# Patient Record
Sex: Female | Born: 1940
Health system: Southern US, Community
[De-identification: ages and names within clinical notes are randomized; demographics above are authoritative.]

## PROBLEM LIST (undated history)

## (undated) DIAGNOSIS — M1990 Unspecified osteoarthritis, unspecified site: Secondary | ICD-10-CM

## (undated) DIAGNOSIS — E7800 Pure hypercholesterolemia, unspecified: Secondary | ICD-10-CM

## (undated) DIAGNOSIS — I499 Cardiac arrhythmia, unspecified: Secondary | ICD-10-CM

## (undated) DIAGNOSIS — R351 Nocturia: Secondary | ICD-10-CM

## (undated) DIAGNOSIS — Z87440 Personal history of urinary (tract) infections: Secondary | ICD-10-CM

## (undated) DIAGNOSIS — N179 Acute kidney failure, unspecified: Secondary | ICD-10-CM

## (undated) DIAGNOSIS — G4730 Sleep apnea, unspecified: Secondary | ICD-10-CM

## (undated) DIAGNOSIS — H9319 Tinnitus, unspecified ear: Secondary | ICD-10-CM

## (undated) DIAGNOSIS — I209 Angina pectoris, unspecified: Secondary | ICD-10-CM

## (undated) DIAGNOSIS — R350 Frequency of micturition: Secondary | ICD-10-CM

## (undated) DIAGNOSIS — R202 Paresthesia of skin: Secondary | ICD-10-CM

## (undated) DIAGNOSIS — J45909 Unspecified asthma, uncomplicated: Secondary | ICD-10-CM

## (undated) DIAGNOSIS — R32 Unspecified urinary incontinence: Secondary | ICD-10-CM

## (undated) DIAGNOSIS — K219 Gastro-esophageal reflux disease without esophagitis: Secondary | ICD-10-CM

## (undated) DIAGNOSIS — R42 Dizziness and giddiness: Secondary | ICD-10-CM

## (undated) DIAGNOSIS — C801 Malignant (primary) neoplasm, unspecified: Secondary | ICD-10-CM

## (undated) DIAGNOSIS — I249 Acute ischemic heart disease, unspecified: Secondary | ICD-10-CM

## (undated) DIAGNOSIS — IMO0001 Reserved for inherently not codable concepts without codable children: Secondary | ICD-10-CM

## (undated) DIAGNOSIS — I10 Essential (primary) hypertension: Secondary | ICD-10-CM

## (undated) DIAGNOSIS — N189 Chronic kidney disease, unspecified: Secondary | ICD-10-CM

## (undated) HISTORY — PX: RENAL MASS EXCISION: SHX2324

## (undated) HISTORY — PX: APPENDECTOMY: SHX54

## (undated) HISTORY — DX: Unspecified asthma, uncomplicated: J45.909

## (undated) HISTORY — DX: Sleep apnea, unspecified: G47.30

## (undated) HISTORY — DX: Acute ischemic heart disease, unspecified: I24.9

## (undated) HISTORY — DX: Acute kidney failure, unspecified: N17.9

## (undated) HISTORY — PX: ABDOMINAL HYSTERECTOMY: SHX81

## (undated) HISTORY — PX: SPINE SURGERY: SHX786

---

## 1997-09-19 ENCOUNTER — Emergency Department (HOSPITAL_COMMUNITY): Admission: EM | Admit: 1997-09-19 | Discharge: 1997-09-19 | Payer: Self-pay | Admitting: Emergency Medicine

## 1998-06-06 ENCOUNTER — Ambulatory Visit (HOSPITAL_BASED_OUTPATIENT_CLINIC_OR_DEPARTMENT_OTHER): Admission: RE | Admit: 1998-06-06 | Discharge: 1998-06-06 | Payer: Self-pay | Admitting: Orthopaedic Surgery

## 1998-09-25 ENCOUNTER — Emergency Department (HOSPITAL_COMMUNITY): Admission: EM | Admit: 1998-09-25 | Discharge: 1998-09-25 | Payer: Self-pay | Admitting: Emergency Medicine

## 1998-09-25 ENCOUNTER — Encounter: Payer: Self-pay | Admitting: Emergency Medicine

## 1998-10-24 ENCOUNTER — Ambulatory Visit: Admission: RE | Admit: 1998-10-24 | Discharge: 1998-10-24 | Payer: Self-pay | Admitting: Family Medicine

## 1999-01-10 ENCOUNTER — Encounter: Admission: RE | Admit: 1999-01-10 | Discharge: 1999-01-10 | Payer: Self-pay | Admitting: Family Medicine

## 1999-01-10 ENCOUNTER — Encounter: Payer: Self-pay | Admitting: Family Medicine

## 1999-10-18 ENCOUNTER — Emergency Department (HOSPITAL_COMMUNITY): Admission: EM | Admit: 1999-10-18 | Discharge: 1999-10-19 | Payer: Self-pay | Admitting: Emergency Medicine

## 1999-11-22 ENCOUNTER — Encounter: Payer: Self-pay | Admitting: Family Medicine

## 1999-11-22 ENCOUNTER — Encounter: Admission: RE | Admit: 1999-11-22 | Discharge: 1999-11-22 | Payer: Self-pay | Admitting: Family Medicine

## 2000-04-21 ENCOUNTER — Emergency Department (HOSPITAL_COMMUNITY): Admission: EM | Admit: 2000-04-21 | Discharge: 2000-04-21 | Payer: Self-pay | Admitting: Emergency Medicine

## 2000-04-30 ENCOUNTER — Encounter: Admission: RE | Admit: 2000-04-30 | Discharge: 2000-04-30 | Payer: Self-pay | Admitting: Family Medicine

## 2000-04-30 ENCOUNTER — Encounter: Payer: Self-pay | Admitting: Family Medicine

## 2000-05-15 ENCOUNTER — Encounter: Payer: Self-pay | Admitting: Family Medicine

## 2000-05-15 ENCOUNTER — Encounter: Admission: RE | Admit: 2000-05-15 | Discharge: 2000-05-15 | Payer: Self-pay | Admitting: Family Medicine

## 2000-12-29 ENCOUNTER — Encounter: Payer: Self-pay | Admitting: Family Medicine

## 2000-12-29 ENCOUNTER — Encounter: Admission: RE | Admit: 2000-12-29 | Discharge: 2000-12-29 | Payer: Self-pay | Admitting: Family Medicine

## 2001-04-28 ENCOUNTER — Encounter: Payer: Self-pay | Admitting: Family Medicine

## 2001-04-28 ENCOUNTER — Encounter: Admission: RE | Admit: 2001-04-28 | Discharge: 2001-04-28 | Payer: Self-pay | Admitting: Family Medicine

## 2001-09-14 ENCOUNTER — Encounter: Admission: RE | Admit: 2001-09-14 | Discharge: 2001-09-14 | Payer: Self-pay | Admitting: Family Medicine

## 2001-09-14 ENCOUNTER — Encounter: Payer: Self-pay | Admitting: Family Medicine

## 2001-09-24 ENCOUNTER — Ambulatory Visit (HOSPITAL_COMMUNITY): Admission: RE | Admit: 2001-09-24 | Discharge: 2001-09-24 | Payer: Self-pay | Admitting: Gastroenterology

## 2001-12-01 ENCOUNTER — Encounter: Admission: RE | Admit: 2001-12-01 | Discharge: 2002-03-01 | Payer: Self-pay | Admitting: Family Medicine

## 2002-01-22 ENCOUNTER — Encounter: Admission: RE | Admit: 2002-01-22 | Discharge: 2002-01-22 | Payer: Self-pay | Admitting: Family Medicine

## 2002-01-22 ENCOUNTER — Encounter: Payer: Self-pay | Admitting: Family Medicine

## 2002-06-08 ENCOUNTER — Encounter: Payer: Self-pay | Admitting: Family Medicine

## 2002-06-08 ENCOUNTER — Encounter: Admission: RE | Admit: 2002-06-08 | Discharge: 2002-06-08 | Payer: Self-pay | Admitting: Family Medicine

## 2002-07-29 ENCOUNTER — Encounter: Payer: Self-pay | Admitting: Neurosurgery

## 2002-08-02 ENCOUNTER — Encounter: Payer: Self-pay | Admitting: Neurosurgery

## 2002-08-02 ENCOUNTER — Inpatient Hospital Stay (HOSPITAL_COMMUNITY): Admission: RE | Admit: 2002-08-02 | Discharge: 2002-08-04 | Payer: Self-pay | Admitting: Neurosurgery

## 2002-08-07 ENCOUNTER — Inpatient Hospital Stay (HOSPITAL_COMMUNITY): Admission: EM | Admit: 2002-08-07 | Discharge: 2002-08-10 | Payer: Self-pay

## 2002-08-07 ENCOUNTER — Encounter: Payer: Self-pay | Admitting: *Deleted

## 2002-08-08 ENCOUNTER — Encounter: Payer: Self-pay | Admitting: Family Medicine

## 2002-08-18 ENCOUNTER — Encounter: Payer: Self-pay | Admitting: Emergency Medicine

## 2002-08-18 ENCOUNTER — Emergency Department (HOSPITAL_COMMUNITY): Admission: EM | Admit: 2002-08-18 | Discharge: 2002-08-18 | Payer: Self-pay | Admitting: Emergency Medicine

## 2003-03-25 ENCOUNTER — Encounter: Admission: RE | Admit: 2003-03-25 | Discharge: 2003-03-25 | Payer: Self-pay | Admitting: Family Medicine

## 2003-05-09 ENCOUNTER — Emergency Department (HOSPITAL_COMMUNITY): Admission: EM | Admit: 2003-05-09 | Discharge: 2003-05-10 | Payer: Self-pay | Admitting: Emergency Medicine

## 2003-07-20 ENCOUNTER — Ambulatory Visit (HOSPITAL_COMMUNITY): Admission: RE | Admit: 2003-07-20 | Discharge: 2003-07-20 | Payer: Self-pay | Admitting: Neurosurgery

## 2003-08-04 ENCOUNTER — Inpatient Hospital Stay (HOSPITAL_COMMUNITY): Admission: RE | Admit: 2003-08-04 | Discharge: 2003-08-08 | Payer: Self-pay | Admitting: Neurosurgery

## 2003-08-12 ENCOUNTER — Emergency Department (HOSPITAL_COMMUNITY): Admission: EM | Admit: 2003-08-12 | Discharge: 2003-08-12 | Payer: Self-pay | Admitting: Emergency Medicine

## 2003-09-12 ENCOUNTER — Emergency Department (HOSPITAL_COMMUNITY): Admission: EM | Admit: 2003-09-12 | Discharge: 2003-09-13 | Payer: Self-pay | Admitting: Emergency Medicine

## 2003-10-19 ENCOUNTER — Emergency Department (HOSPITAL_COMMUNITY): Admission: EM | Admit: 2003-10-19 | Discharge: 2003-10-19 | Payer: Self-pay

## 2003-10-27 ENCOUNTER — Encounter: Admission: RE | Admit: 2003-10-27 | Discharge: 2003-10-27 | Payer: Self-pay | Admitting: Family Medicine

## 2003-12-02 ENCOUNTER — Ambulatory Visit (HOSPITAL_COMMUNITY): Admission: RE | Admit: 2003-12-02 | Discharge: 2003-12-02 | Payer: Self-pay | Admitting: Neurosurgery

## 2004-10-16 ENCOUNTER — Encounter: Admission: RE | Admit: 2004-10-16 | Discharge: 2004-10-16 | Payer: Self-pay | Admitting: Family Medicine

## 2005-01-30 ENCOUNTER — Emergency Department (HOSPITAL_COMMUNITY): Admission: EM | Admit: 2005-01-30 | Discharge: 2005-01-30 | Payer: Self-pay | Admitting: Emergency Medicine

## 2005-02-02 ENCOUNTER — Emergency Department (HOSPITAL_COMMUNITY): Admission: EM | Admit: 2005-02-02 | Discharge: 2005-02-02 | Payer: Self-pay | Admitting: Emergency Medicine

## 2005-06-18 IMAGING — CT CT CHEST W/ CM
1 of 2 series · 15 of 28 positions shown, 19 images · IV contrast (omnipaque)
Comparison: none

FINDINGS
CLINICAL DATA: SHORTNESS OF BREATH AND RECENT CERVICAL SPINE FUSION SURGERY.
CT CHEST WITH CONTRAST, CT LOWER EXTREMITY BILATERAL WITH CONTRAST 08/07/02
COMPARISON CHEST X-RAY PERFORMED ON THE SAME DAY.
CT CHEST WITH CONTRAST
PATIENT RECEIVED 150 CC OMNIPAQUE 300 IV CONTRAST.  A PE PROTOCOL CHEST CT WAS PERFORMED.  THE
PULMONARY ARTERIES ARE WELL OPACIFIED TO THE SUBSEGMENTAL LEVEL.  NO EVIDENCE OF FILLING DEFECT TO
SUGGEST PULMONARY EMBOLISM BY CT.  LUNG WINDOWS SHOW NO EVIDENCE OF INFILTRATE OR EDEMA.  SCATTERED
AREAS OF SCARRING AND SUBSEGMENTAL ATELECTASIS PRESENT IN BOTH LUNGS.  NO EVIDENCE OF ADENOPATHY.
NO PLEURAL OR PERICARDIAL FLUID.
IMPRESSION
NORMAL CT OF THE CHEST.  NO EVIDENCE OF PULMONARY EMBOLISM OR OTHER ACUTE PROCESS.
CT LOWER EXTREMITY BILATERAL WITH CONTRAST
DELAYED IMAGING THROUGH THE LOWER EXTREMITIES AND PELVIS WAS PERFORMED AFTER ADMINISTRATION OF IV
CONTRAST ON THE CHEST CT.  DEEP VENOUS STRUCTURES ARE WELL OPACIFIED AND ARE NORMALLY PATENT
BILATERALLY WITHOUT EVIDENCE OF THROMBUS.
NO EVIDENCE OF VENOUS THROMBOSIS.

[Series 2: *don't forget:recon (date) offon · axial · 0.70mm/px · z∈[-700,-59]mm · 15 of 171 slices shown, 19 images]
[im 10/171  mediastinal]
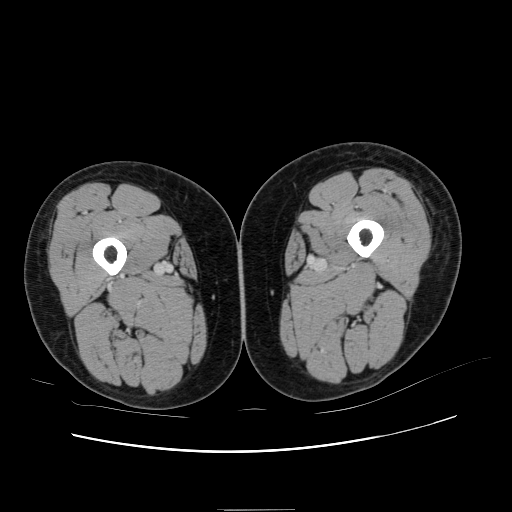
[im 10/171  lung]
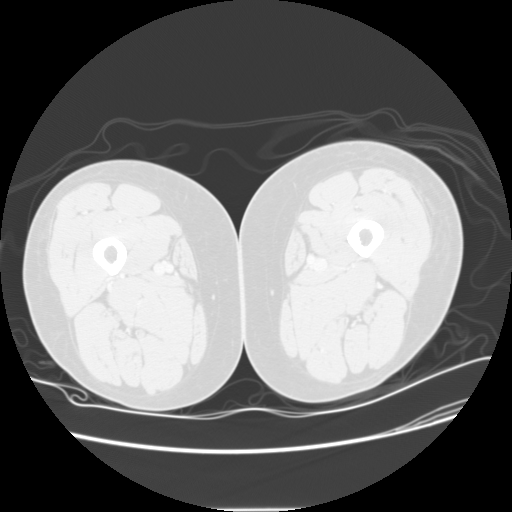
[im 19/171  lung]
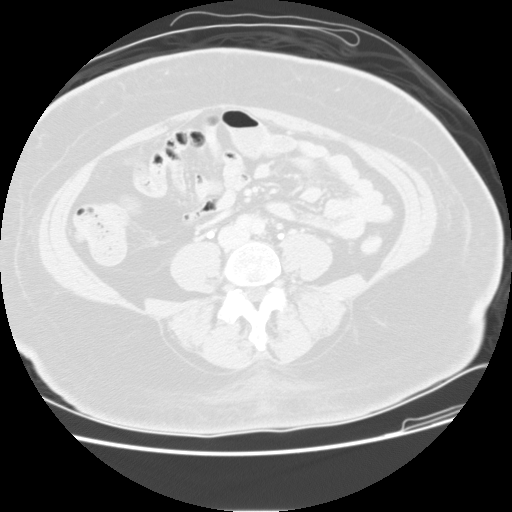
[im 29/171  lung]
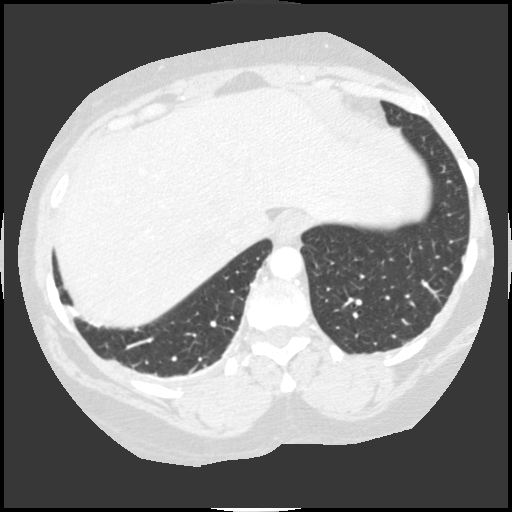
[im 38/171  lung]
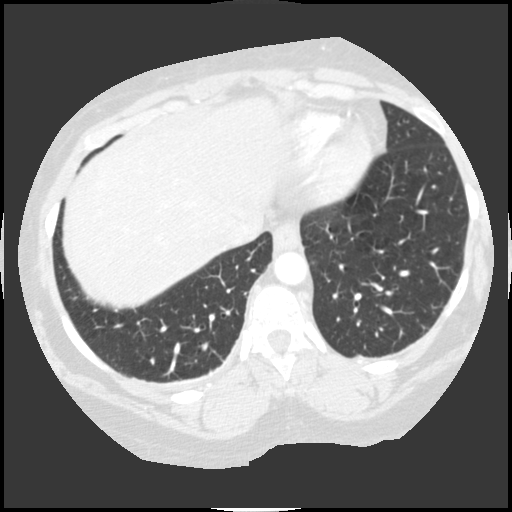
[im 57/171  mediastinal]
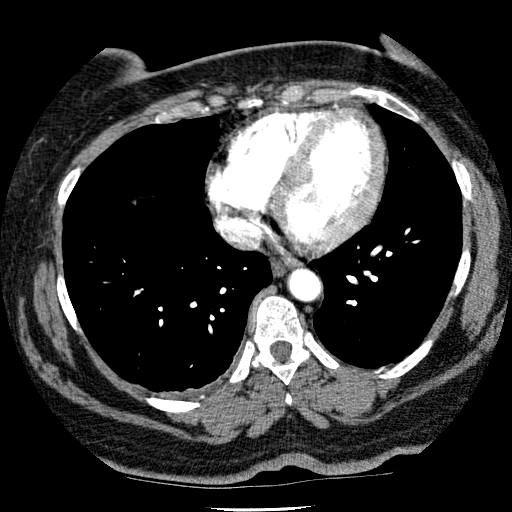
[im 57/171  lung]
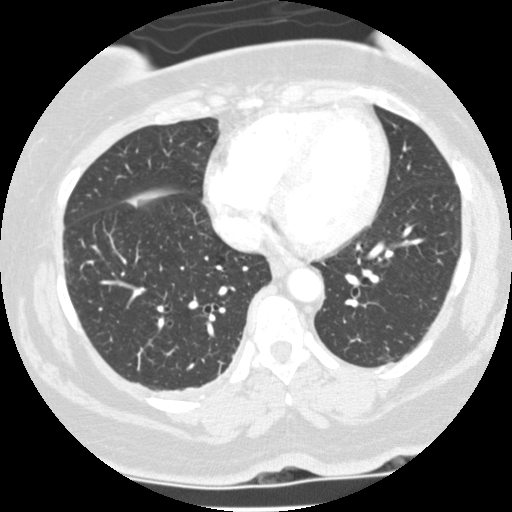
[im 67/171  lung]
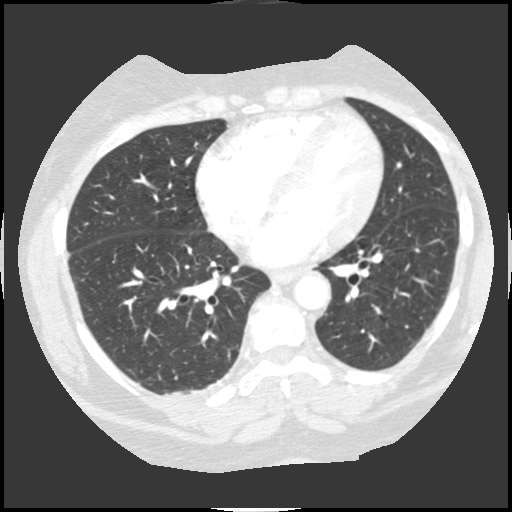
[im 76/171  lung]
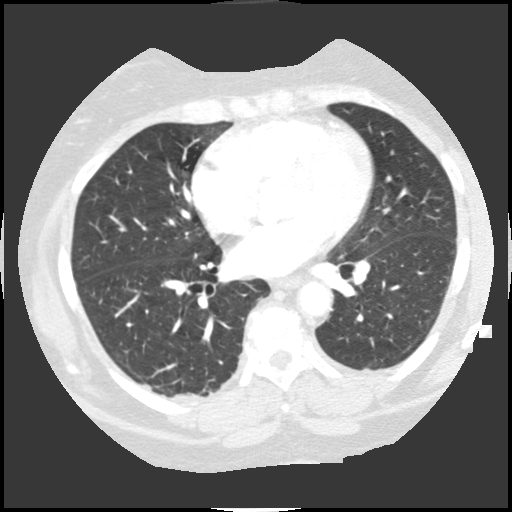
[im 86/171  lung]
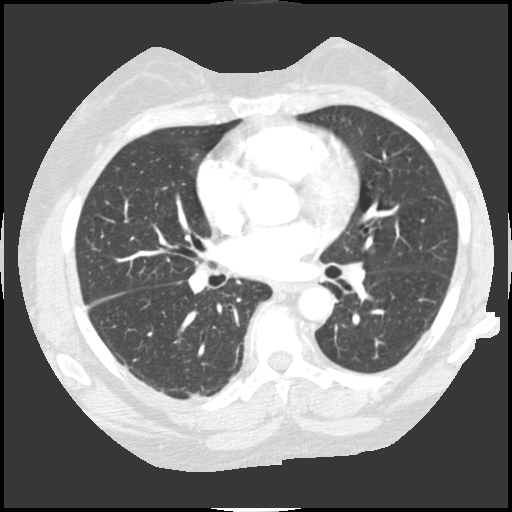
[im 95/171  mediastinal]
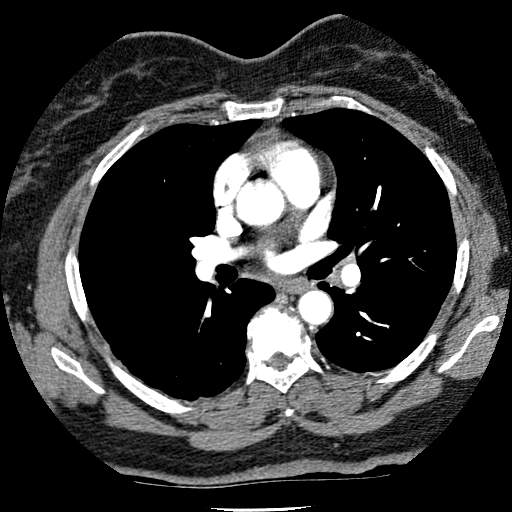
[im 95/171  lung]
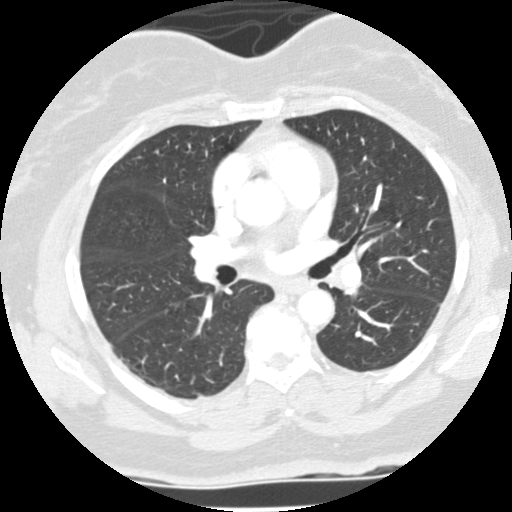
[im 104/171  lung]
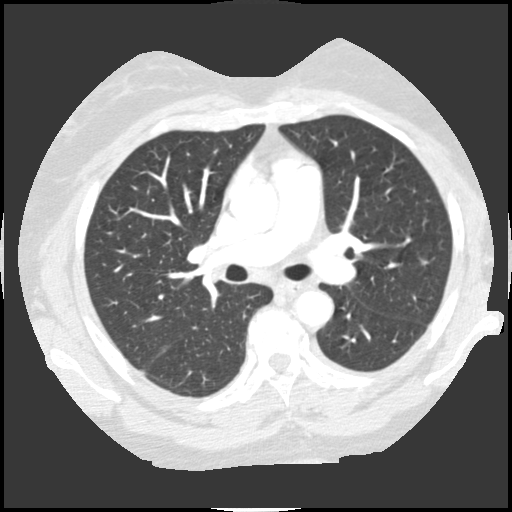
[im 114/171  lung]
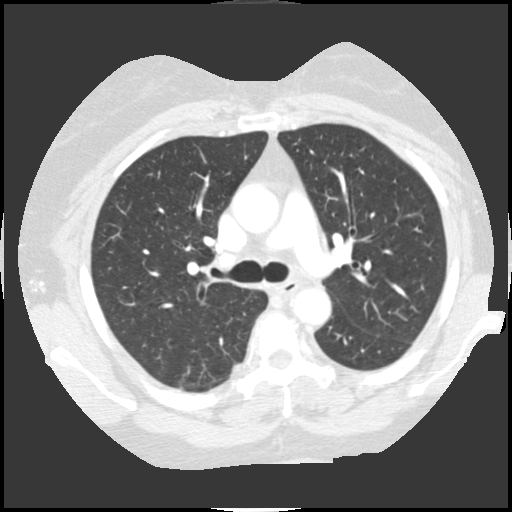
[im 133/171  lung]
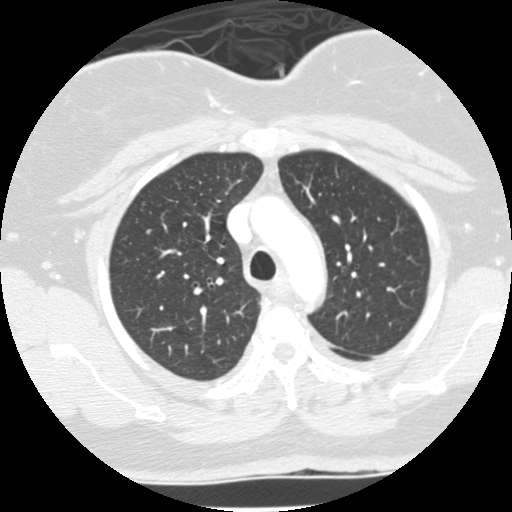
[im 142/171  mediastinal]
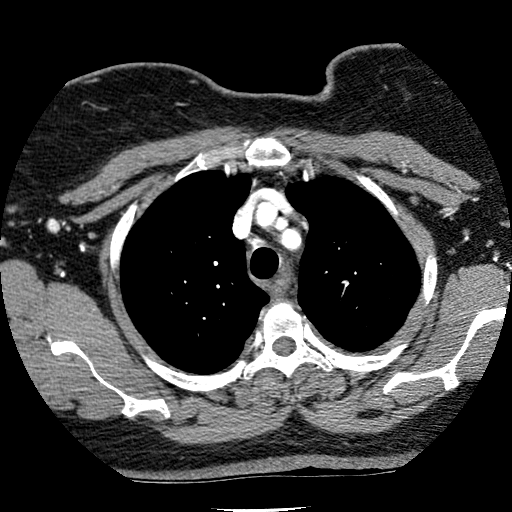
[im 142/171  lung]
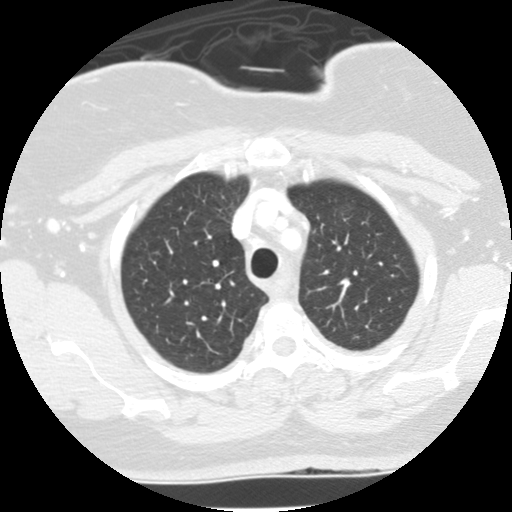
[im 152/171  lung]
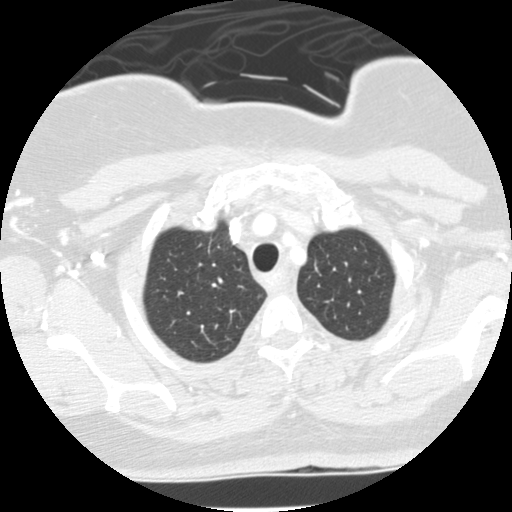
[im 161/171  lung]
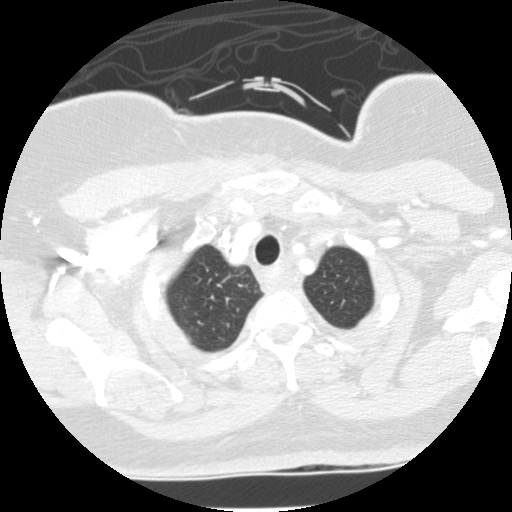

[15 of 28 positions shown; findings below may reference images not displayed]

## 2005-10-01 ENCOUNTER — Emergency Department (HOSPITAL_COMMUNITY): Admission: EM | Admit: 2005-10-01 | Discharge: 2005-10-02 | Payer: Self-pay | Admitting: Emergency Medicine

## 2005-10-02 ENCOUNTER — Emergency Department (HOSPITAL_COMMUNITY): Admission: EM | Admit: 2005-10-02 | Discharge: 2005-10-02 | Payer: Self-pay | Admitting: Anesthesiology

## 2005-10-03 ENCOUNTER — Emergency Department (HOSPITAL_COMMUNITY): Admission: EM | Admit: 2005-10-03 | Discharge: 2005-10-03 | Payer: Self-pay | Admitting: Emergency Medicine

## 2005-10-29 ENCOUNTER — Emergency Department (HOSPITAL_COMMUNITY): Admission: EM | Admit: 2005-10-29 | Discharge: 2005-10-29 | Payer: Self-pay | Admitting: Emergency Medicine

## 2005-11-18 ENCOUNTER — Encounter: Admission: RE | Admit: 2005-11-18 | Discharge: 2005-11-18 | Payer: Self-pay | Admitting: Emergency Medicine

## 2005-12-04 ENCOUNTER — Encounter: Admission: RE | Admit: 2005-12-04 | Discharge: 2005-12-04 | Payer: Self-pay | Admitting: Emergency Medicine

## 2005-12-09 ENCOUNTER — Encounter: Admission: RE | Admit: 2005-12-09 | Discharge: 2005-12-09 | Payer: Self-pay | Admitting: Emergency Medicine

## 2005-12-18 ENCOUNTER — Encounter: Admission: RE | Admit: 2005-12-18 | Discharge: 2005-12-18 | Payer: Self-pay | Admitting: Emergency Medicine

## 2006-01-15 ENCOUNTER — Encounter: Admission: RE | Admit: 2006-01-15 | Discharge: 2006-01-15 | Payer: Self-pay | Admitting: Emergency Medicine

## 2006-03-04 ENCOUNTER — Encounter: Admission: RE | Admit: 2006-03-04 | Discharge: 2006-03-04 | Payer: Self-pay | Admitting: Emergency Medicine

## 2006-03-10 ENCOUNTER — Inpatient Hospital Stay (HOSPITAL_COMMUNITY): Admission: EM | Admit: 2006-03-10 | Discharge: 2006-03-13 | Payer: Self-pay | Admitting: Emergency Medicine

## 2006-03-11 ENCOUNTER — Encounter (INDEPENDENT_AMBULATORY_CARE_PROVIDER_SITE_OTHER): Payer: Self-pay | Admitting: Cardiovascular Disease

## 2006-03-18 ENCOUNTER — Ambulatory Visit: Payer: Self-pay | Admitting: Gastroenterology

## 2006-04-15 ENCOUNTER — Ambulatory Visit: Payer: Self-pay | Admitting: Gastroenterology

## 2006-04-15 ENCOUNTER — Encounter (INDEPENDENT_AMBULATORY_CARE_PROVIDER_SITE_OTHER): Payer: Self-pay | Admitting: Specialist

## 2006-05-31 IMAGING — CT CT CERVICAL SPINE W/ CM
3 of 7 series · 15 of 33 positions shown, 17 images · IV contrast (agent unspecified)
Comparison: none

** THIS REPORT HAS BEEN UPDATED TO INCLUDE ALL ASSOCIATED EXAMS – 07/25/03**
CLINICAL DATA: Status-post C-5 to C-7 ACDF with continued radicular symptoms. 
 CERVICAL MYELOGRAM AND POST-MYELOGRAM CT 
 CERVICAL MYELOGRAM 
 The lumbar puncture and placement of intrathecal contrast was performed by Dr. Fallon Jim.  Cervical myelogram was then performed.
 The cervical myelogram images are limited due to dilution of the contrast in the cervical region and the cervical plate being in place.  On the lateral views, there does appear to be a small anterior extradural impression at the C3-4 level.  There also appears to be moderate anterior extradural defect at C6-7.  This does not change significantly with flexion or extension nor does alignment.  
 The obliques and AP view are again limited due to dilution of the intrathecal contrast.  Please see CT report below.  
 IMPRESSION
 Mild anterior extradural defect at C3-4.  Moderate anterior extradural defect at C6-7.  Please see post-myelogram CT report below. 
 POST-MYELOGRAM CERVICAL CT WITH CONTRAST
 Post-myelogram CT was performed from the skull base to T-2. 
 Craniocervical junction is unremarkable.  Alignment of the cervical spine is normal.  Patient is status-post ACDF C-5 to C-7 with normal alignment at this region and no evidence of hardware complicating feature. 
 C2-3:  No evidence of herniation, central spinal stenosis or neuroforaminal narrowing. 
 C3-4:  Slight diffuse anterior extradural impression noted, likely related to mild diffuse disc bulge.  No evidence of significant central spinal stenosis or neural foraminal narrowing. 
 C4-5:  There is a small extradural defect noted in the right paracentral region, likely representing a small disc protrusion in this region.  This does not appear to cause significant neural foraminal narrowing although it could cause impression on the right C-5 nerve root.  No central spinal stenosis.  
 C5-6:  Post-operative changes noted from ACDF.  No central spinal stenosis or neuroforaminal narrowing.  No extradural defects. 
 C6-7:  There are large posterior osteophytes causing significant impression on the anterior thecal sac.  The cord does not appear to be deformed in this region.  No evidence of neural foraminal narrowing. 
 C7-T1:  No evidence of central spinal stenosis or neuroforaminal narrowing. 
 1.  Mild broad based disc bulge at C3-4 causing slight impression on the anterior thecal sac. 
 2.  Small right paracentral disc protrusion at C4-5.
 3.  Prominent posterior osteophytes at C6-7 causing loss of the anterior thecal sac.  
 4.  Status-post ACDF C-5 to C-7.

[Series 2: cervical spine · axial · 0.27mm/px · z∈[+84,+194]mm · 5 of 66 slices shown, 7 images]
[im 11/66  soft-tissue]
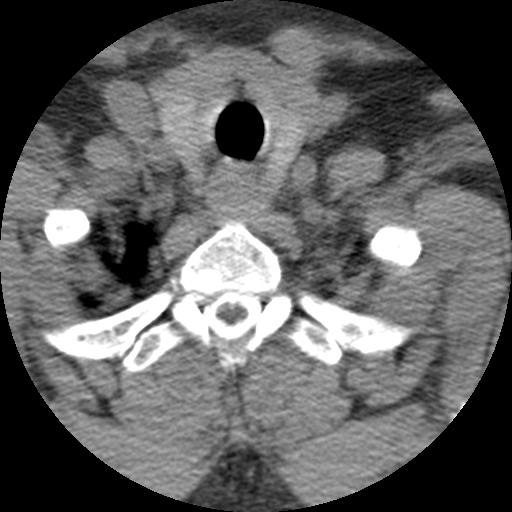
[im 11/66  bone]
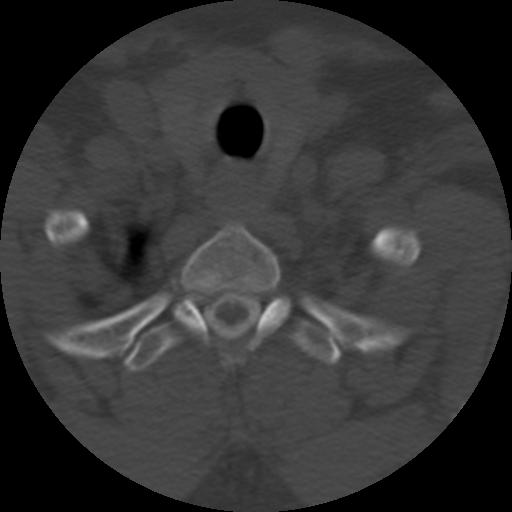
[im 22/66  bone]
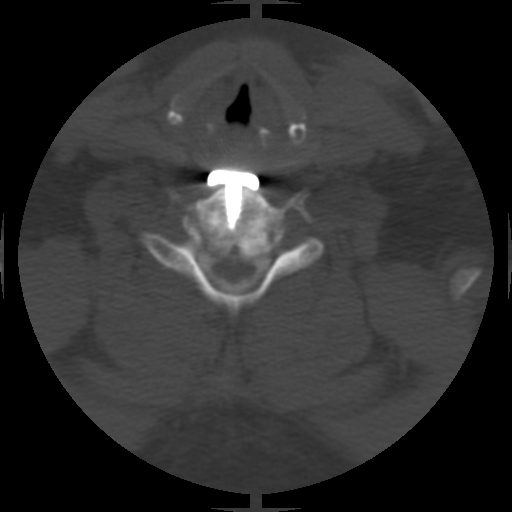
[im 33/66  bone]
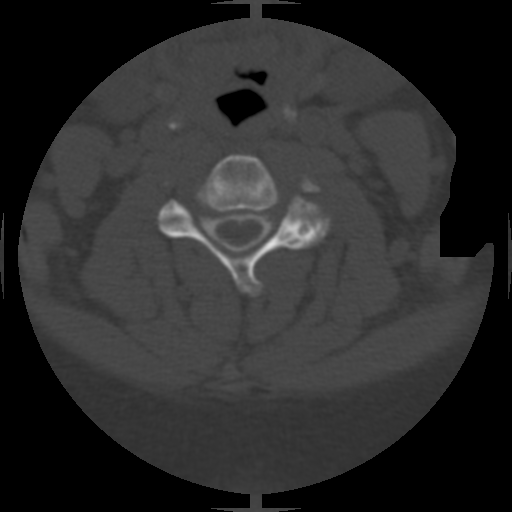
[im 44/66  bone]
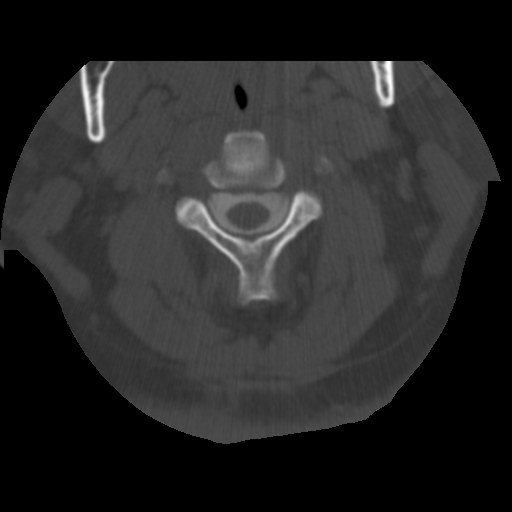
[im 55/66  soft-tissue]
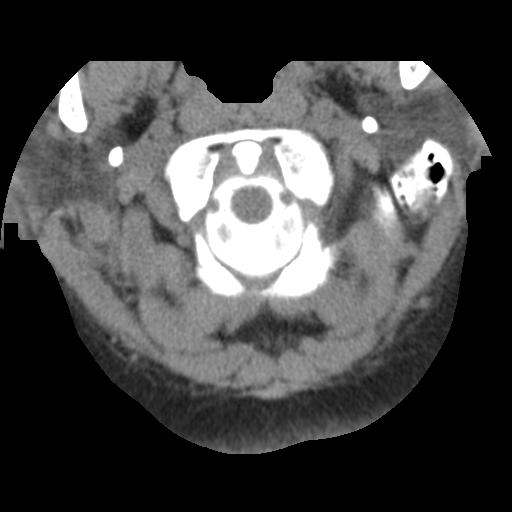
[im 55/66  bone]
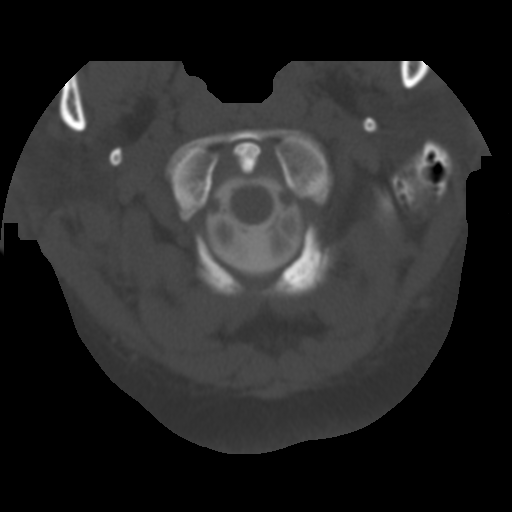

[Series 3: recon 2: cervical spine · axial · 0.27mm/px · z∈[+87,+194]mm · 5 of 65 slices shown]
[im 11/65  bone]
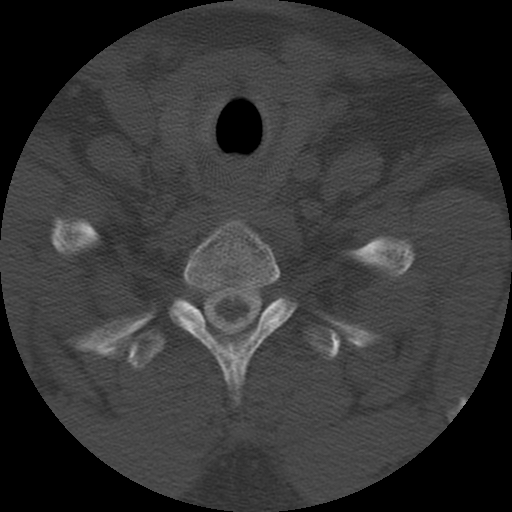
[im 22/65  bone]
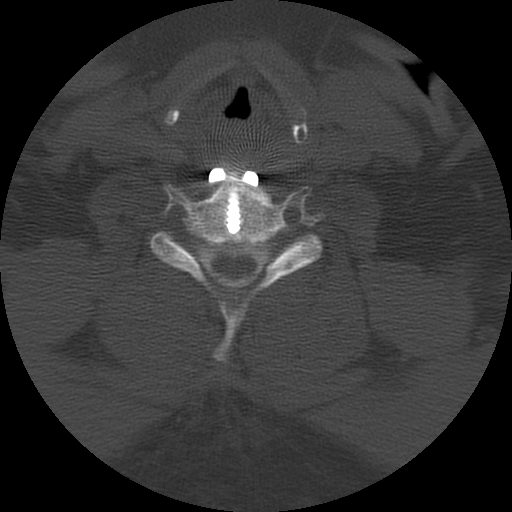
[im 33/65  bone]
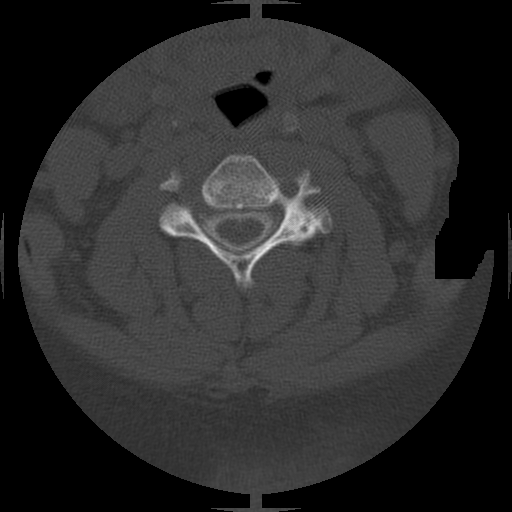
[im 43/65  bone]
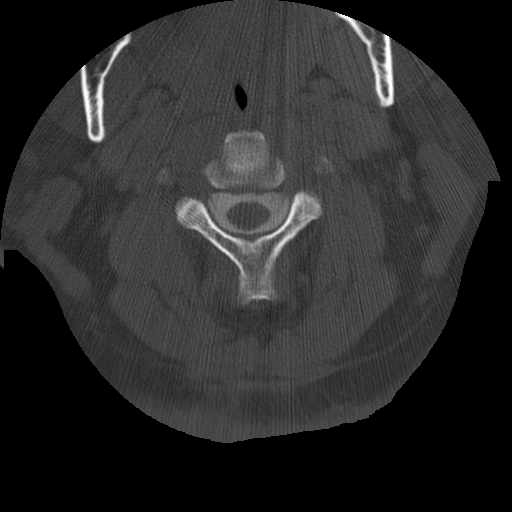
[im 54/65  bone]
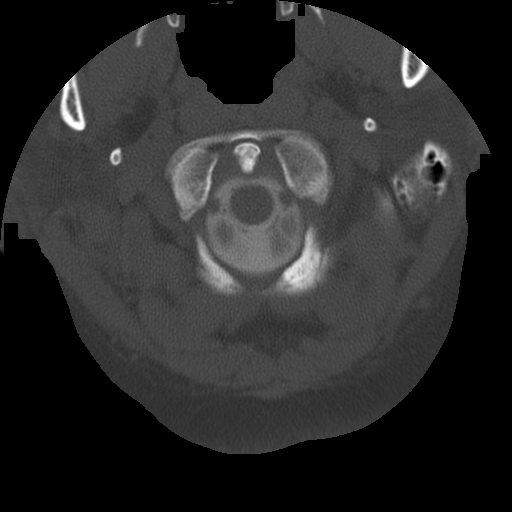

[Series 103: reformatted · sagittal · 0.32mm/px · 5 of 43 slices shown]
[im 8/43  bone]
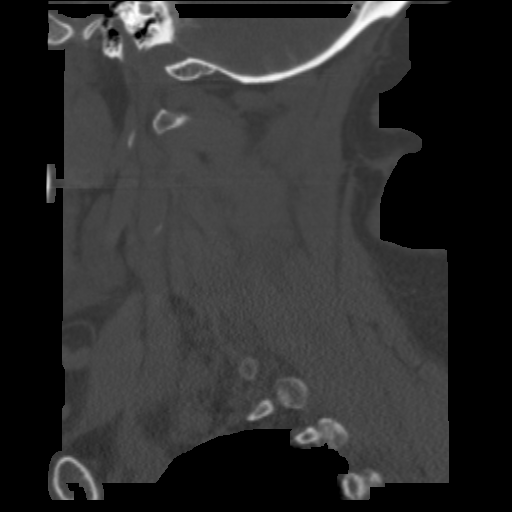
[im 15/43  bone]
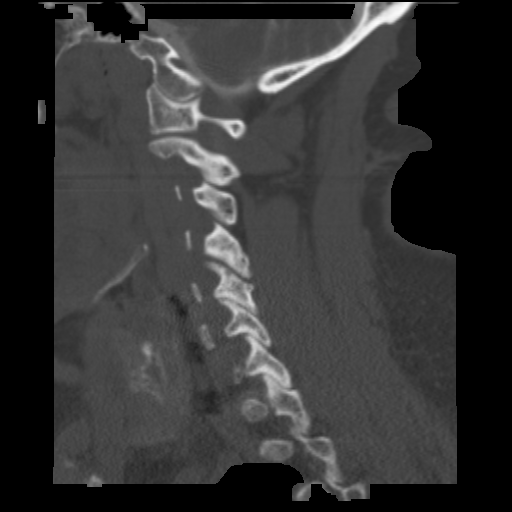
[im 22/43  bone]
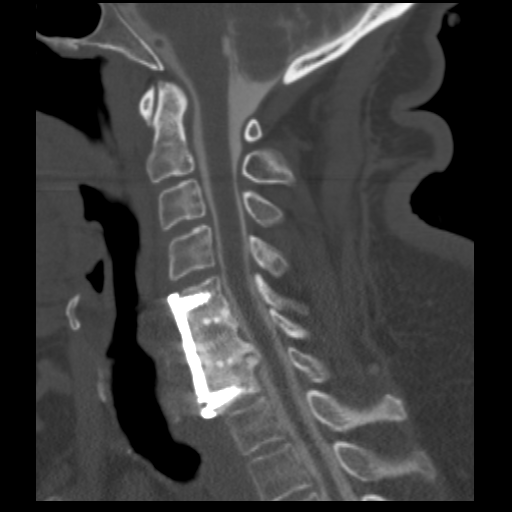
[im 29/43  bone]
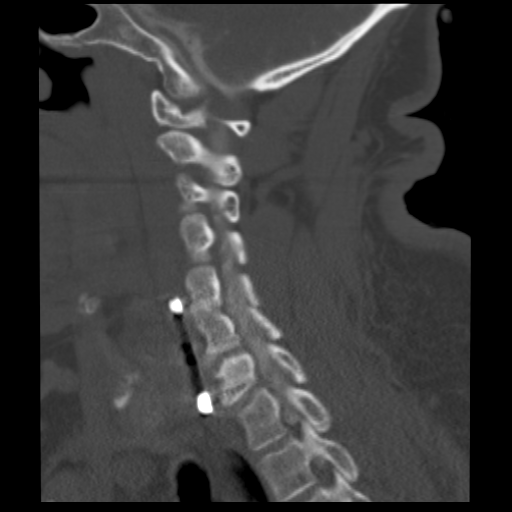
[im 36/43  bone]
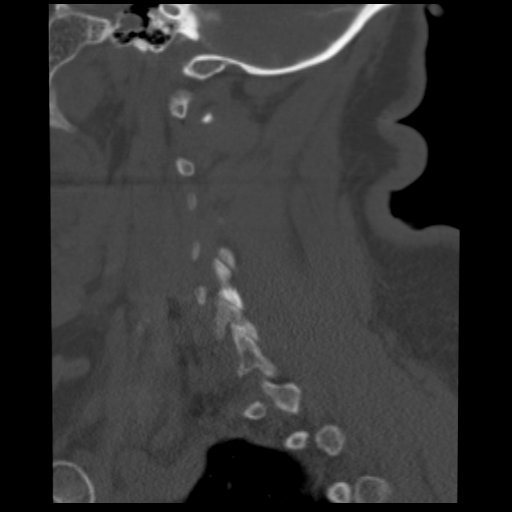

[15 of 33 positions shown; findings below may reference images not displayed]

## 2006-05-31 IMAGING — CR DG MYELOGRAM CERVICAL
3 series · 3 of 3 positions shown · IV contrast (agent unspecified)
Comparison: none

** THIS REPORT HAS BEEN UPDATED TO INCLUDE ALL ASSOCIATED EXAMS – 07/25/03**
CLINICAL DATA: Status-post C-5 to C-7 ACDF with continued radicular symptoms. 
 CERVICAL MYELOGRAM AND POST-MYELOGRAM CT 
 CERVICAL MYELOGRAM 
 The lumbar puncture and placement of intrathecal contrast was performed by Dr. Fallon Jim.  Cervical myelogram was then performed.
 The cervical myelogram images are limited due to dilution of the contrast in the cervical region and the cervical plate being in place.  On the lateral views, there does appear to be a small anterior extradural impression at the C3-4 level.  There also appears to be moderate anterior extradural defect at C6-7.  This does not change significantly with flexion or extension nor does alignment.  
 The obliques and AP view are again limited due to dilution of the intrathecal contrast.  Please see CT report below.  
 IMPRESSION
 Mild anterior extradural defect at C3-4.  Moderate anterior extradural defect at C6-7.  Please see post-myelogram CT report below. 
 POST-MYELOGRAM CERVICAL CT WITH CONTRAST
 Post-myelogram CT was performed from the skull base to T-2. 
 Craniocervical junction is unremarkable.  Alignment of the cervical spine is normal.  Patient is status-post ACDF C-5 to C-7 with normal alignment at this region and no evidence of hardware complicating feature. 
 C2-3:  No evidence of herniation, central spinal stenosis or neuroforaminal narrowing. 
 C3-4:  Slight diffuse anterior extradural impression noted, likely related to mild diffuse disc bulge.  No evidence of significant central spinal stenosis or neural foraminal narrowing. 
 C4-5:  There is a small extradural defect noted in the right paracentral region, likely representing a small disc protrusion in this region.  This does not appear to cause significant neural foraminal narrowing although it could cause impression on the right C-5 nerve root.  No central spinal stenosis.  
 C5-6:  Post-operative changes noted from ACDF.  No central spinal stenosis or neuroforaminal narrowing.  No extradural defects. 
 C6-7:  There are large posterior osteophytes causing significant impression on the anterior thecal sac.  The cord does not appear to be deformed in this region.  No evidence of neural foraminal narrowing. 
 C7-T1:  No evidence of central spinal stenosis or neuroforaminal narrowing. 
 1.  Mild broad based disc bulge at C3-4 causing slight impression on the anterior thecal sac. 
 2.  Small right paracentral disc protrusion at C4-5.
 3.  Prominent posterior osteophytes at C6-7 causing loss of the anterior thecal sac.  
 4.  Status-post ACDF C-5 to C-7.

[view not recorded (1 of 3)]
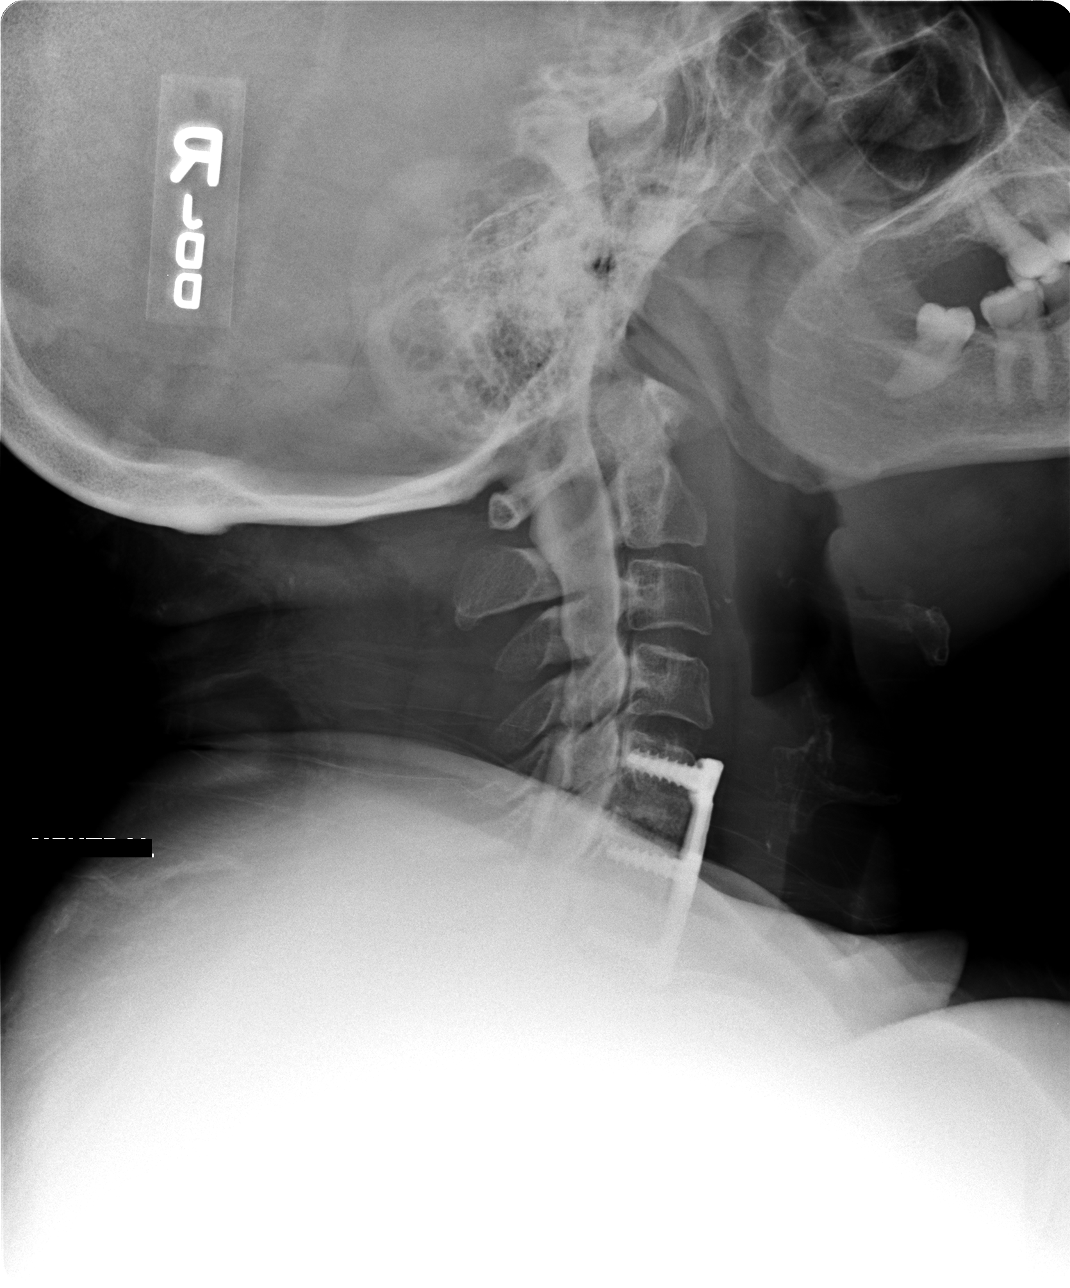

[view not recorded (2 of 3)]
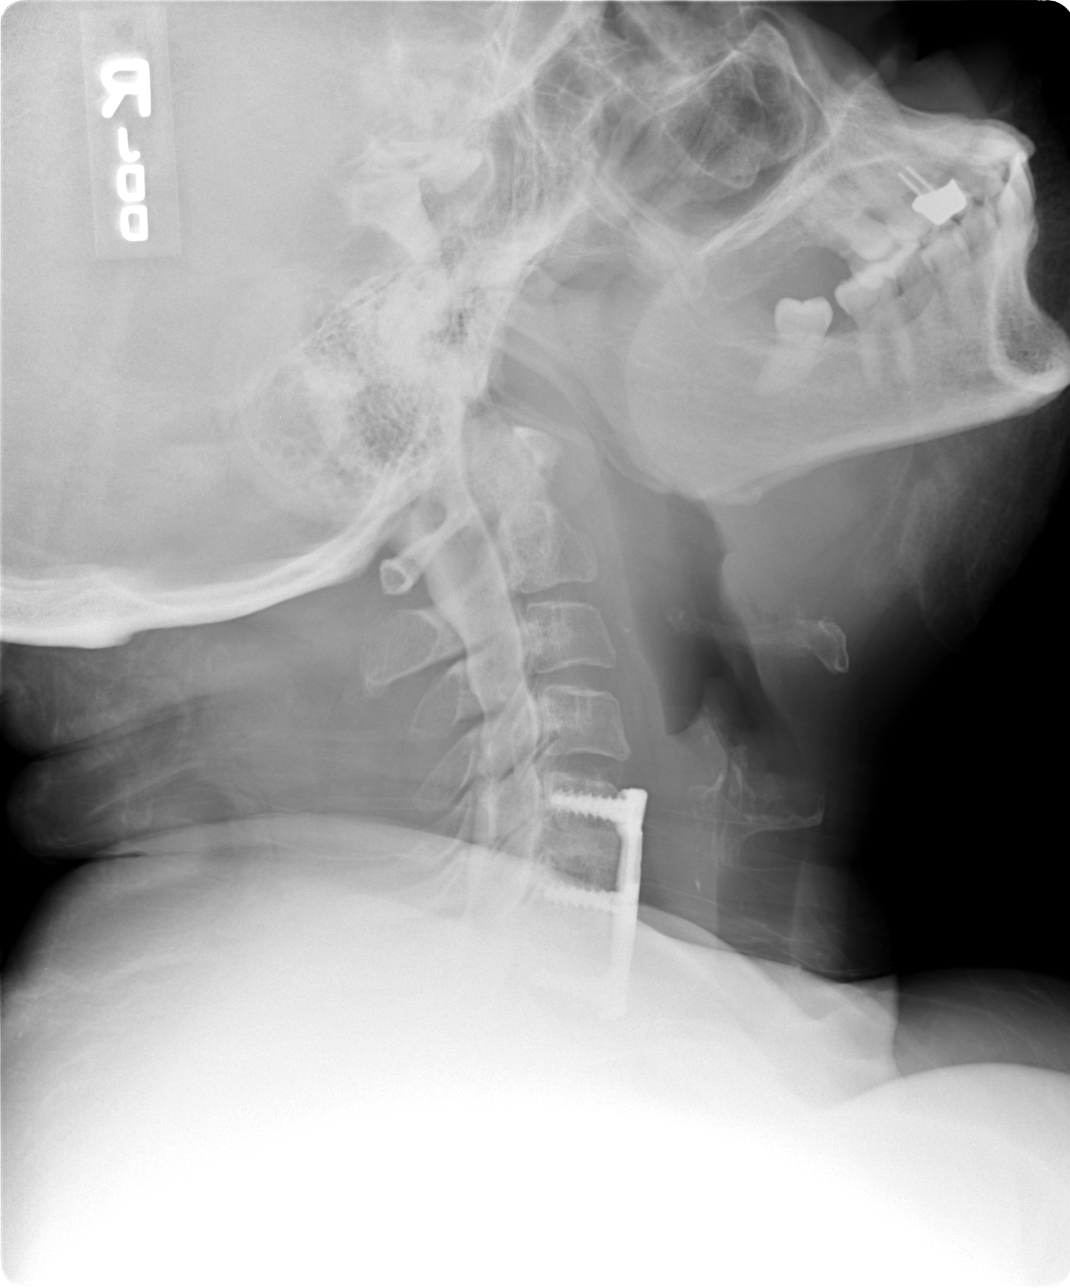

[view not recorded (3 of 3)]
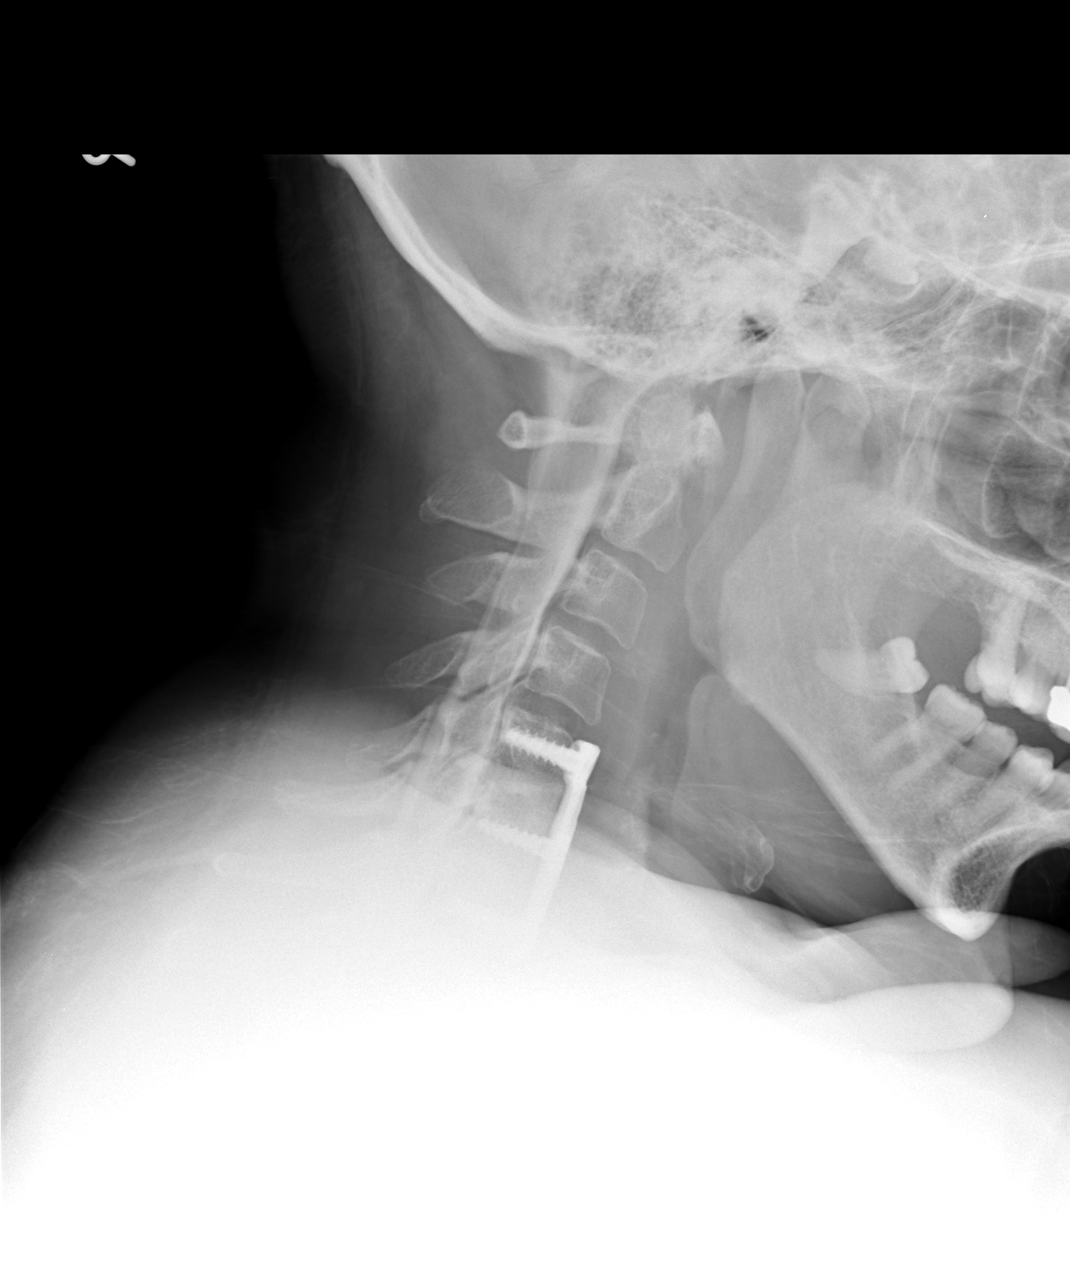

[3 of 3 positions shown; findings below may reference images not displayed]

## 2006-06-13 IMAGING — CR DG CHEST 2V
2 series · 2 of 2 positions shown · non-contrast
Comparison: 08/08/02.

CLINICAL DATA: preoperative respiratory exam; nonsmoker; diabetic
 TWO VIEW CHEST

[view not recorded (1 of 2)]
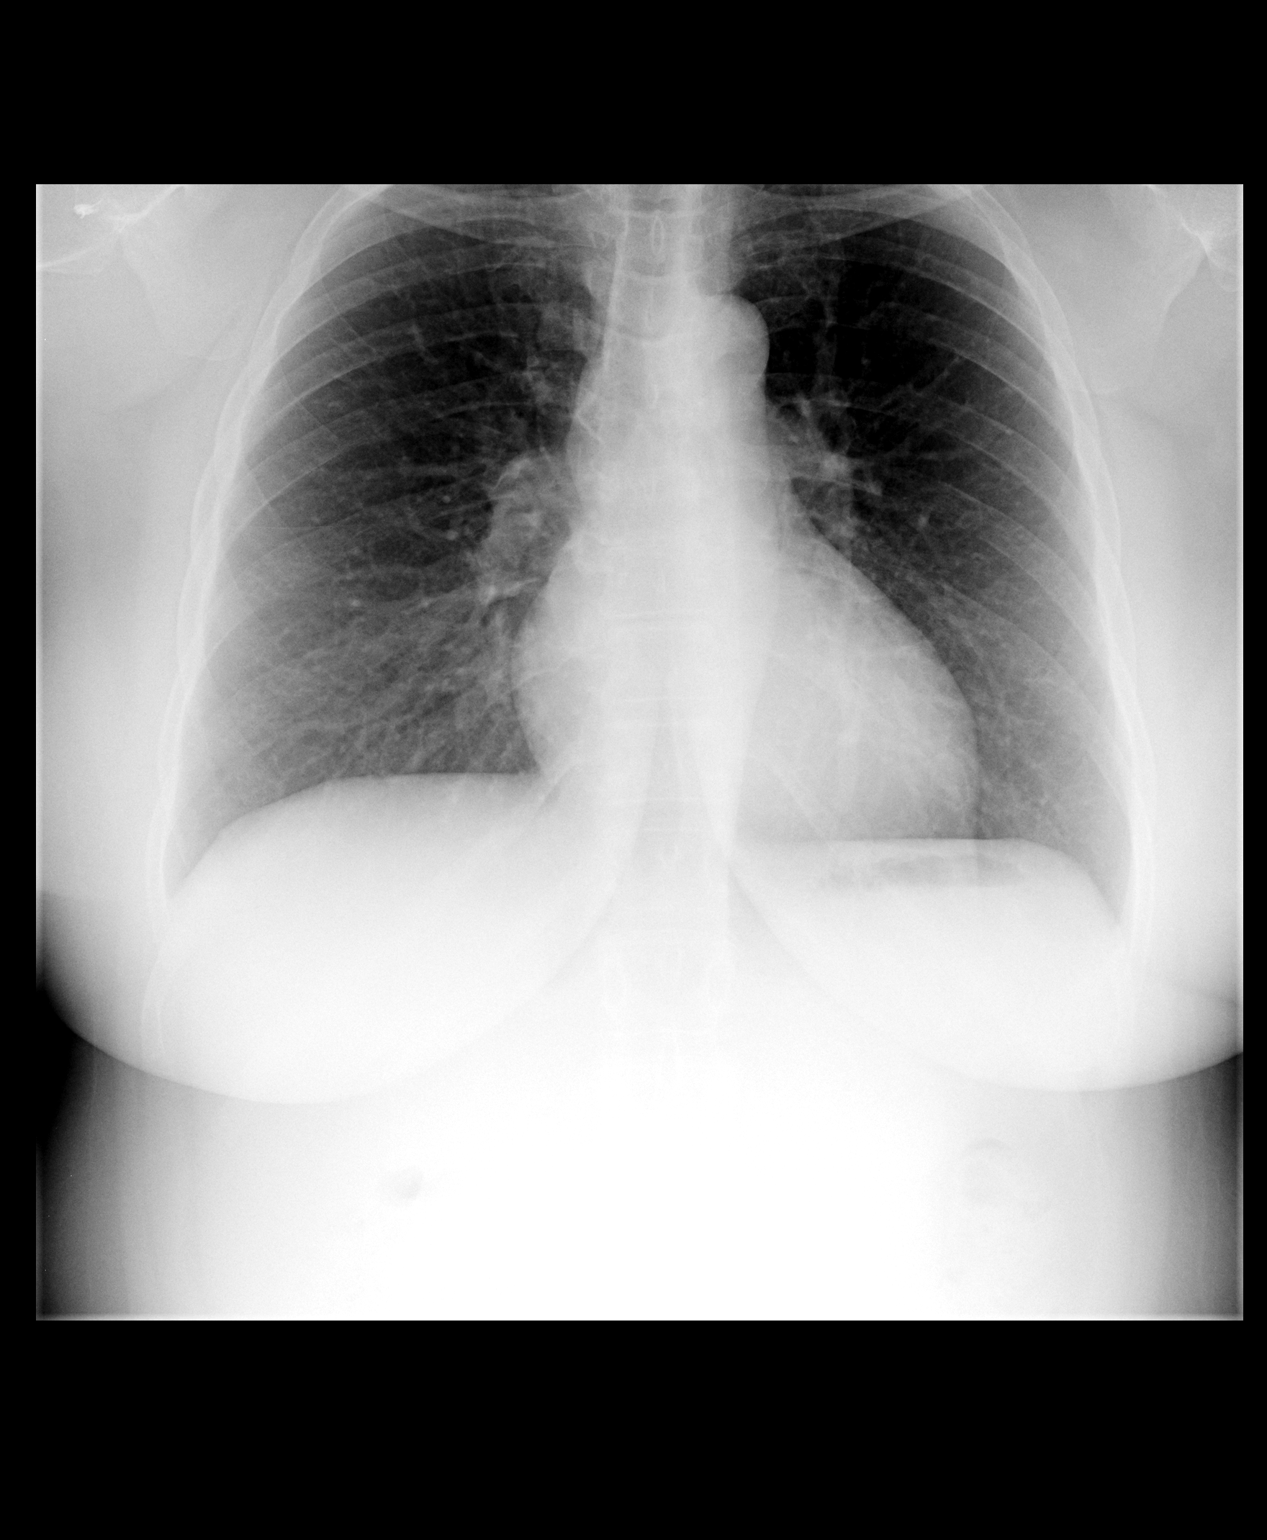

[view not recorded (2 of 2)]
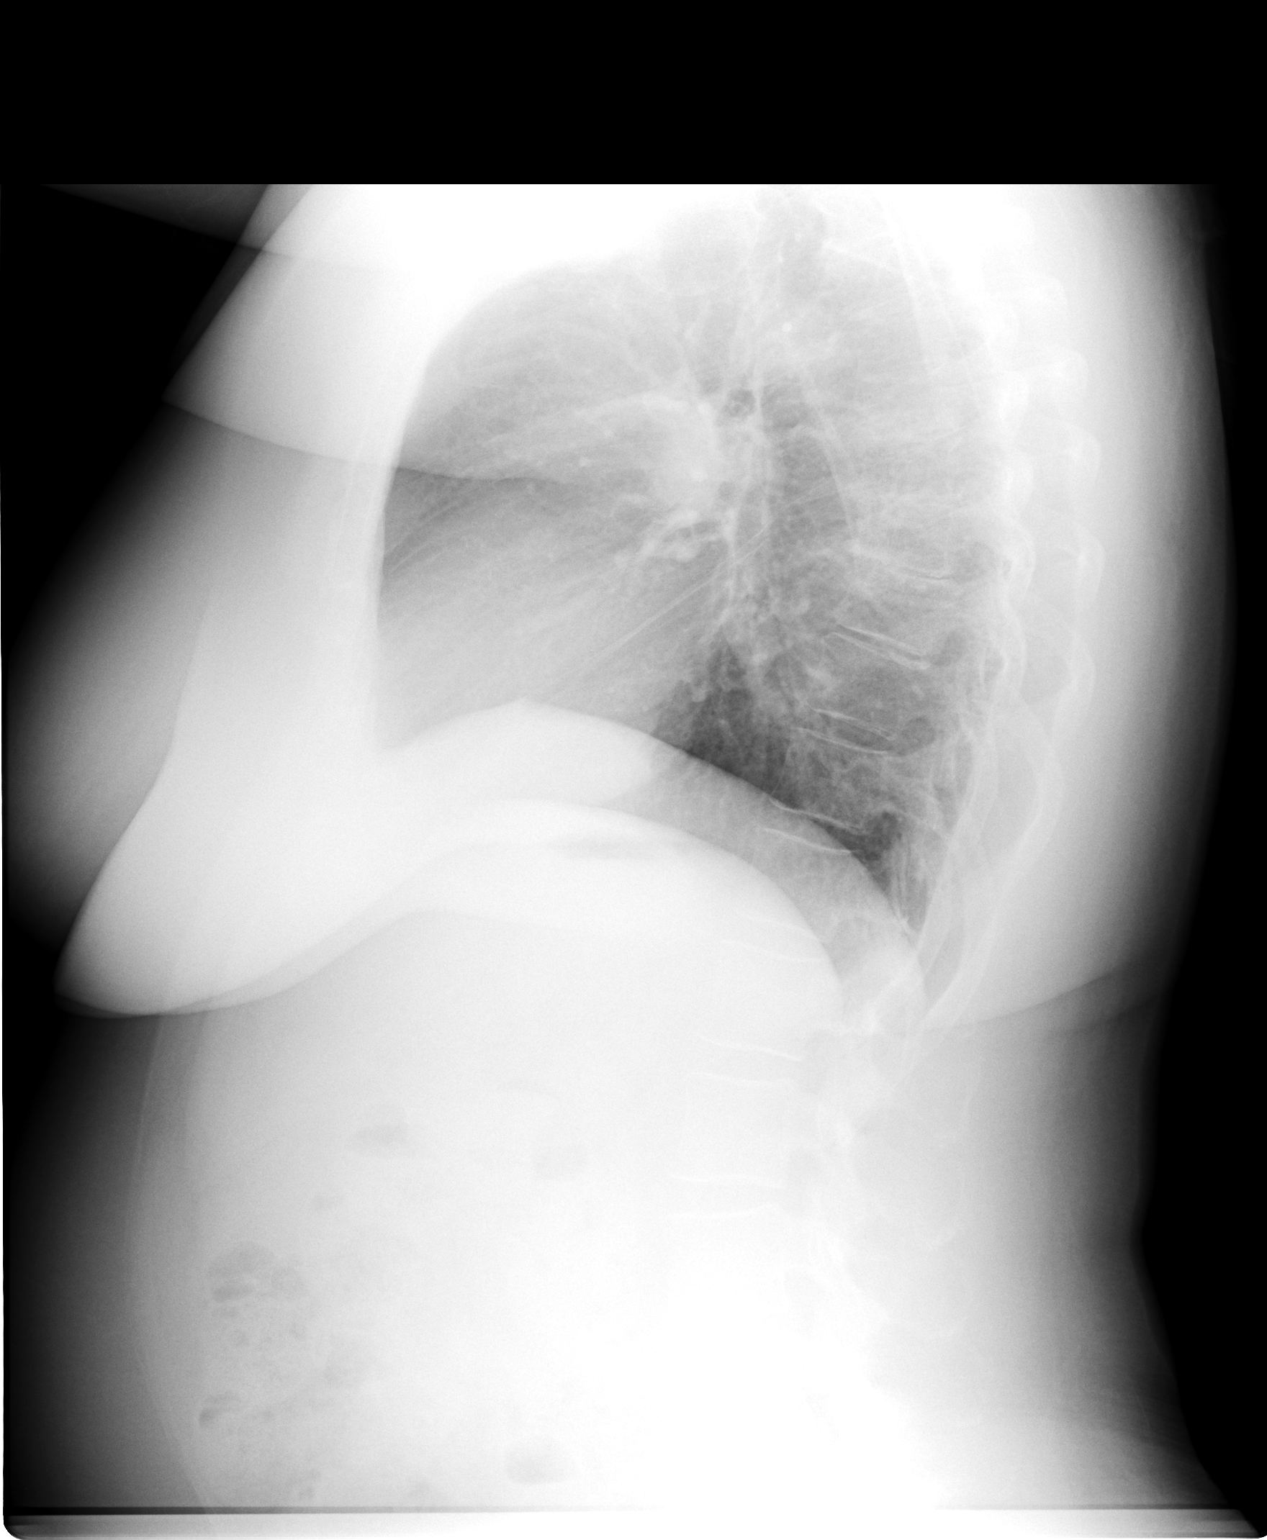

[2 of 2 positions shown; findings below may reference images not displayed]

FINDINGS: Mediastinal and cardiac silhouette within normal limits.  No evidence of infiltrate or congestive heart failure.  
 IMPRESSION
 No evidence of acute abnormality.

## 2006-06-23 IMAGING — CR DG CHEST 2V
2 series · 2 of 2 positions shown · non-contrast
Comparison: Two view chest x-ray 08/02/03.

CLINICAL DATA: Cough, chest congestion.
 TWO VIEW CHEST

[view not recorded (1 of 2)]
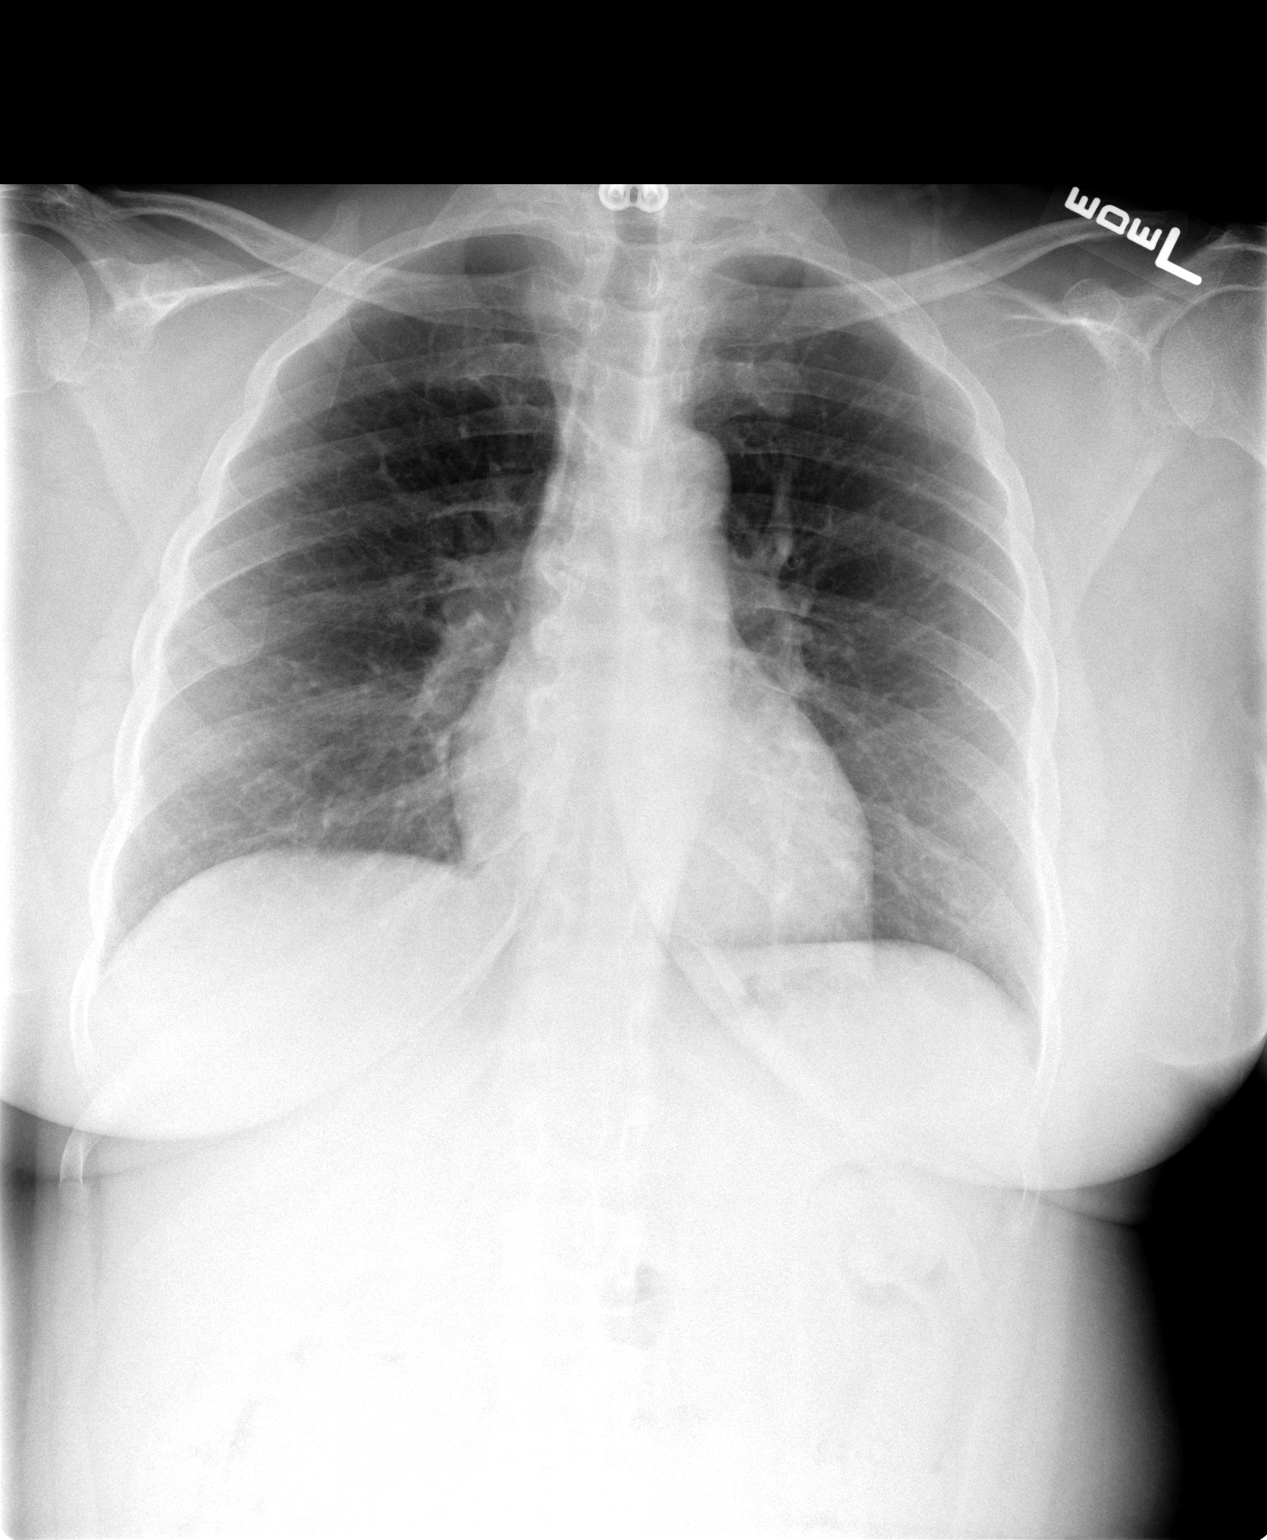

[view not recorded (2 of 2)]
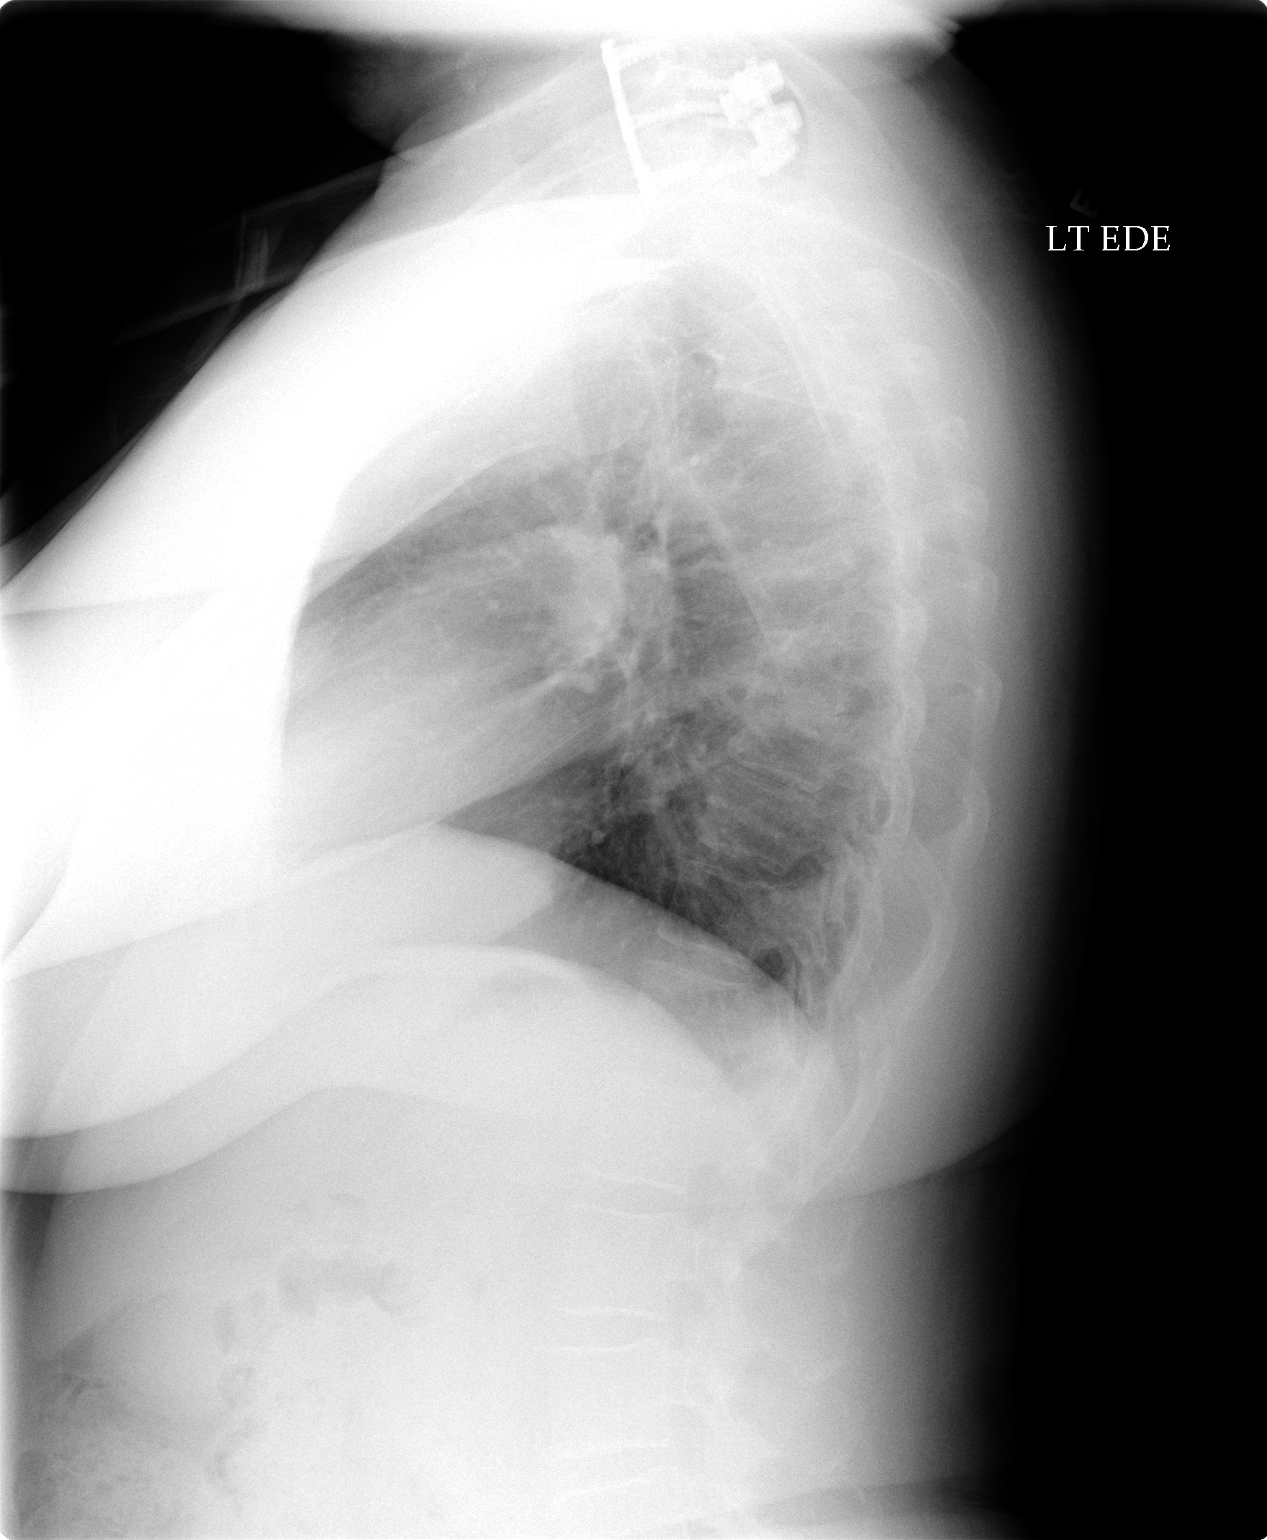

[2 of 2 positions shown; findings below may reference images not displayed]

Cardiomediastinal silhouette is unremarkable for age and stable. The lungs remain clear. There are no pleural effusions. Mild degenerative changes are again noted in the mid thoracic spine. The patient has undergone lower cervical spine fusion.
 IMPRESSION
 No evidence of acute disease. No change since 10 days ago.

## 2006-06-23 IMAGING — CT CT ANGIO CHEST
3 of 5 series · 18 of 30 positions shown · IV contrast (120 ML OMNI)
Comparison: CT chest, PE protocol, 08/07/02.

CLINICAL DATA: Recent cervical spine surgery, pleuritic chest pain in the left side.  
 CT ANGIO CHEST (PULMONARY EMBOLISM PROTOCOL) 08/12/03 
 Multidetector CT imaging of the chest was performed according to the protocol for detection of pulmonary embolism during IV bolus injection of 120 cc Omnipaque 300.  Coronal and sagittal plane reformatted images were also generated.

[Series 3: chest · axial · 0.56mm/px · z∈[-189,-74]mm · 4 of 92 slices shown]
[im 23/92  lung]
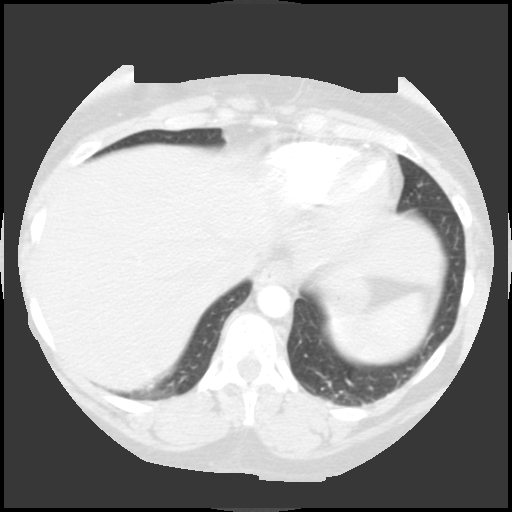
[im 46/92  lung]
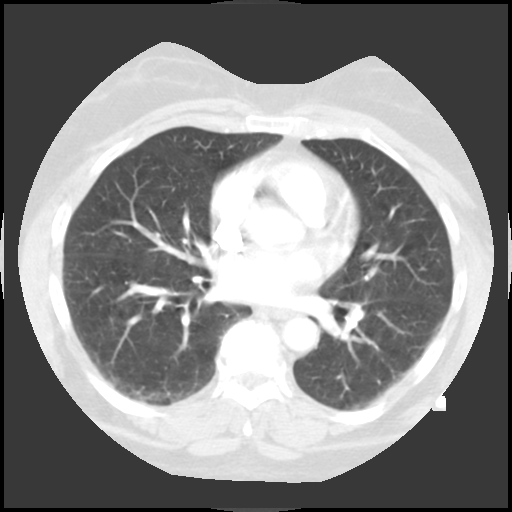
[im 50/92  lung]
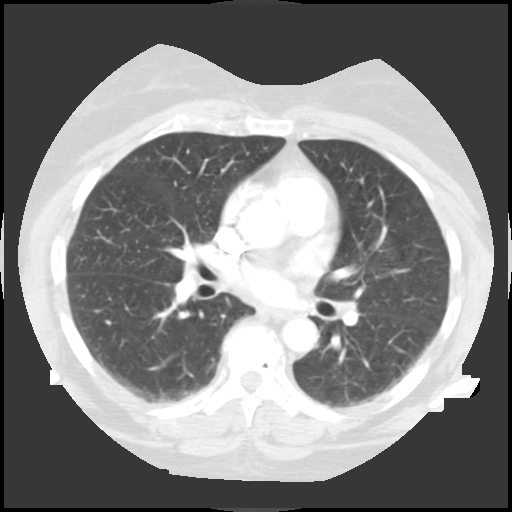
[im 69/92  lung]
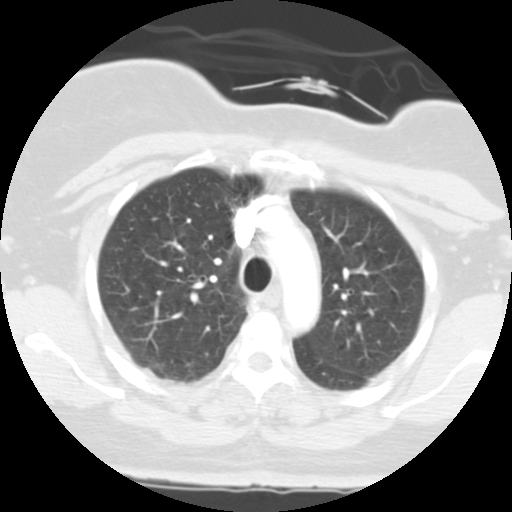

[Series 5: recon 3: chest · axial · 0.56mm/px · z∈[-222,-34]mm · 10 of 186 slices shown]
[im 19/186  lung]
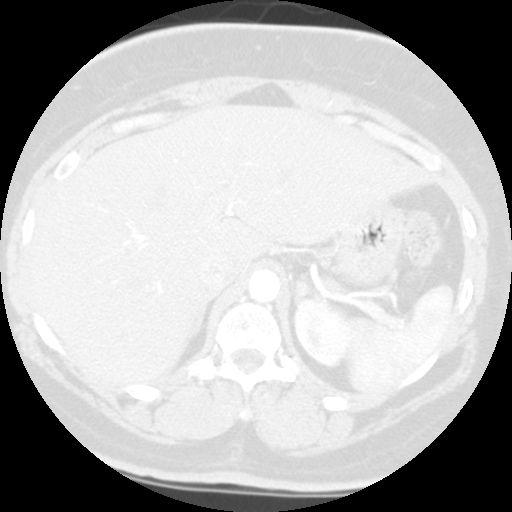
[im 38/186  mediastinal]
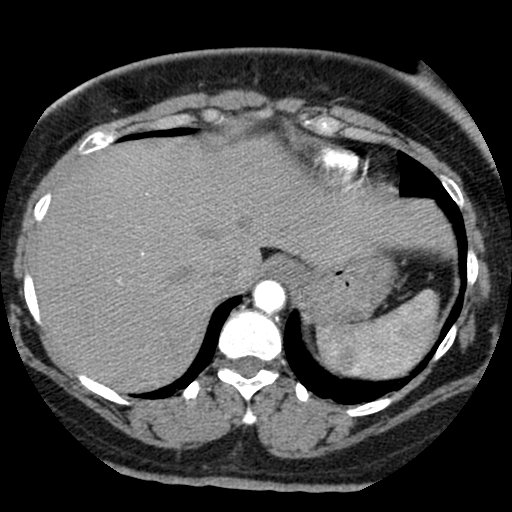
[im 56/186  lung]
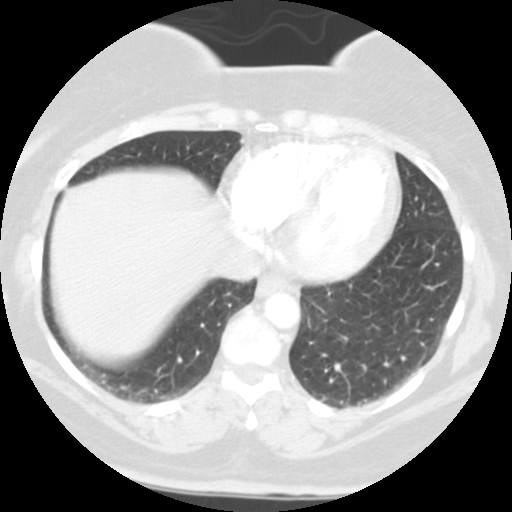
[im 75/186  mediastinal]
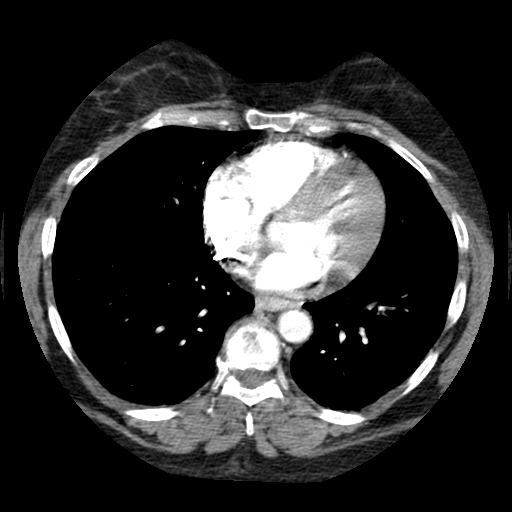
[im 93/186  lung]
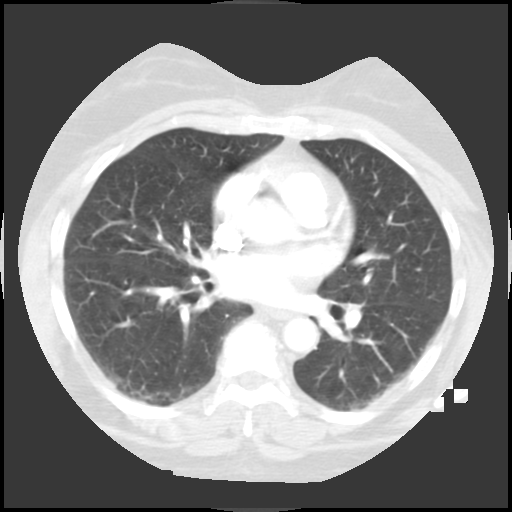
[im 98/186  mediastinal]
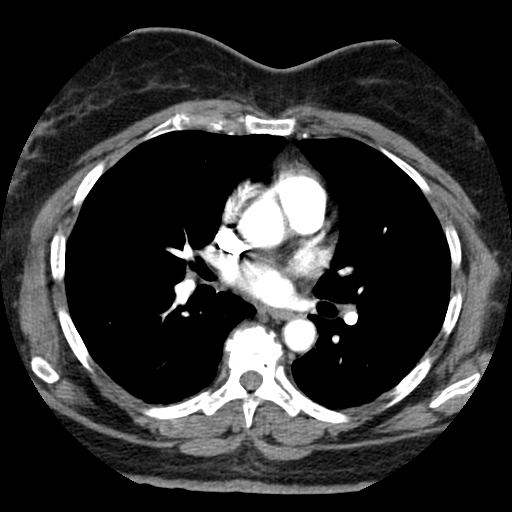
[im 112/186  lung]
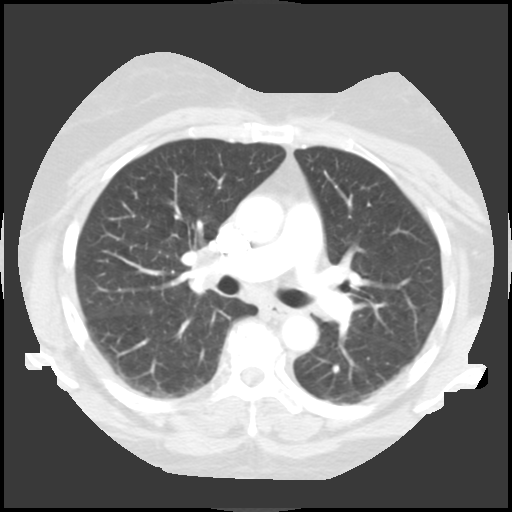
[im 130/186  mediastinal]
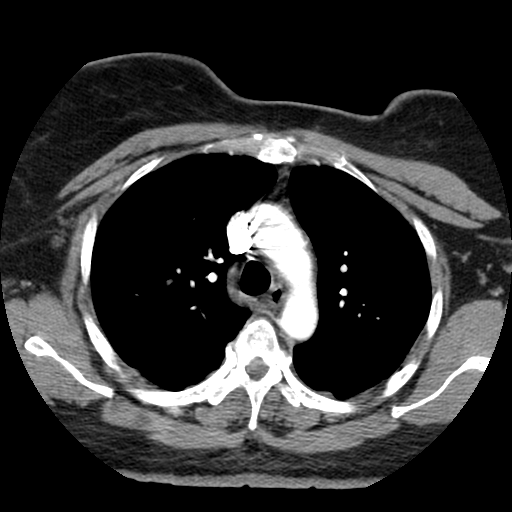
[im 149/186  lung]
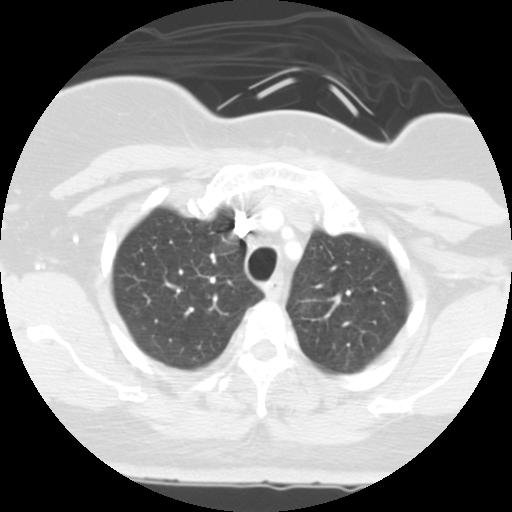
[im 167/186  mediastinal]
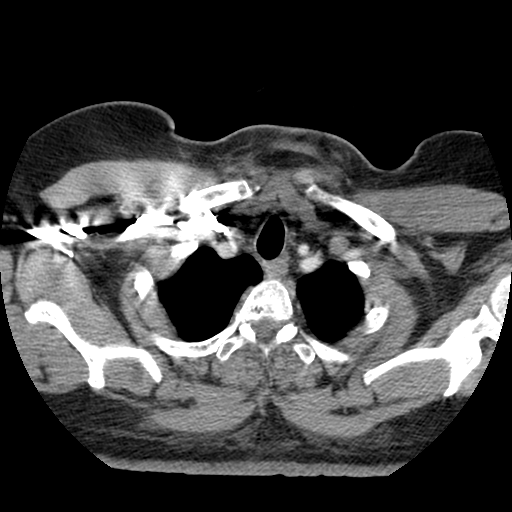

[Series 500: reformatted · sagittal · 0.56mm/px · 4 of 102 slices shown]
[im 21/102  lung]
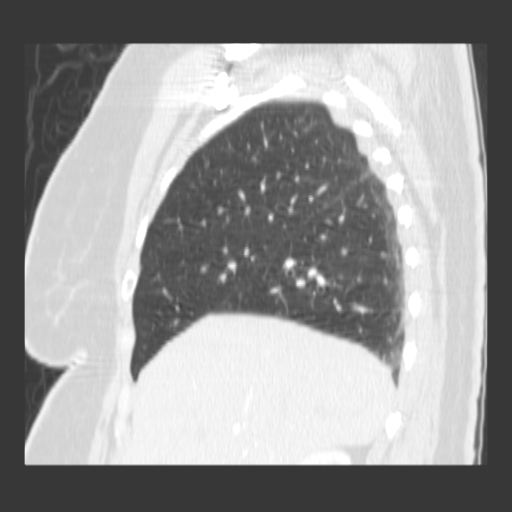
[im 41/102  lung]
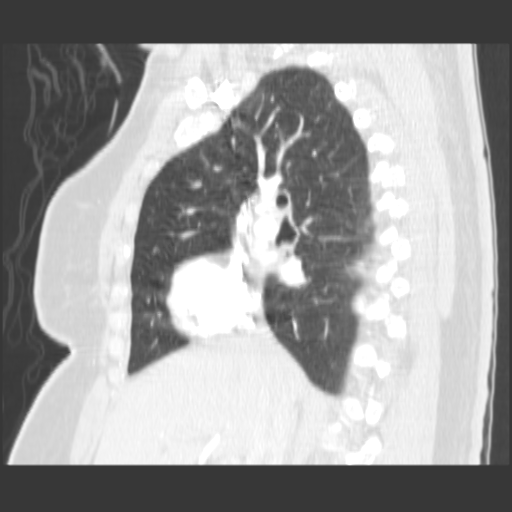
[im 61/102  lung]
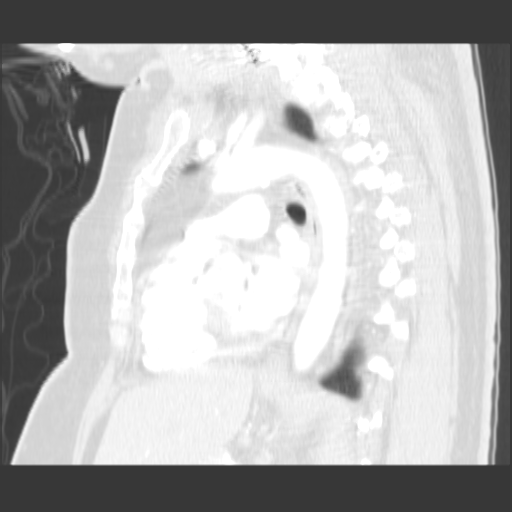
[im 81/102  lung]
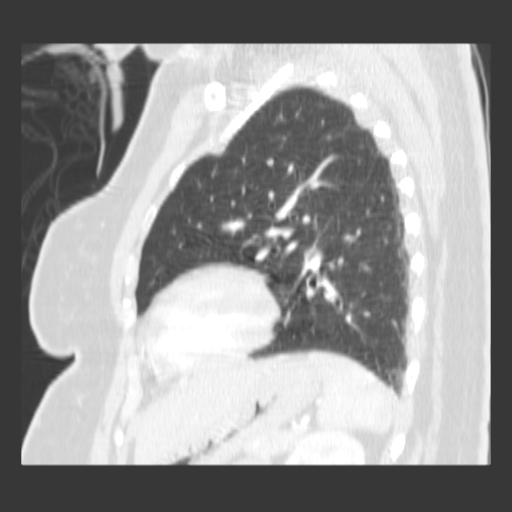

[18 of 30 positions shown; findings below may reference images not displayed]

FINDINGS: Contrast opacification of the pulmonary arteries is excellent, yielding an excellent diagnostic quality examination.  No filling defects are identified within either main pulmonary artery or their branches in either lung to suggest acute pulmonary embolism.  This is confirmed upon review of the computer generated sagittal and coronal reconstructed images.  The thoracic aorta is normal in appearance.  The heart is upper normal in size and there is evidence of left ventricular hypertrophy.  There is no pericardial effusion.  There are no pleural effusions.  The lungs appear clear.  

 IMPRESSION
 1.  No evidence of pulmonary embolism. 
 2.  No acute cardiopulmonary disease. 
 3.  Borderline heart size with evidence of left ventricular hypertrophy.

## 2006-09-07 IMAGING — CR DG CHEST 2V
2 series · 2 of 2 positions shown · non-contrast
Comparison: none

CLINICAL DATA: Persistent cough.  Post-op 08/04/03 neck surgery. 
 CHEST (TWO VIEWS)

  The heart size and mediastinal contours are normal. The lungs are clear. The visualized skeleton is unremarkable. There Is no interval change since [REDACTED] chest x-ray 08/12/03 and [REDACTED] CT angio chests 08/12/03.
 IMPRESSION
 No active disease.

[view not recorded (1 of 2)]
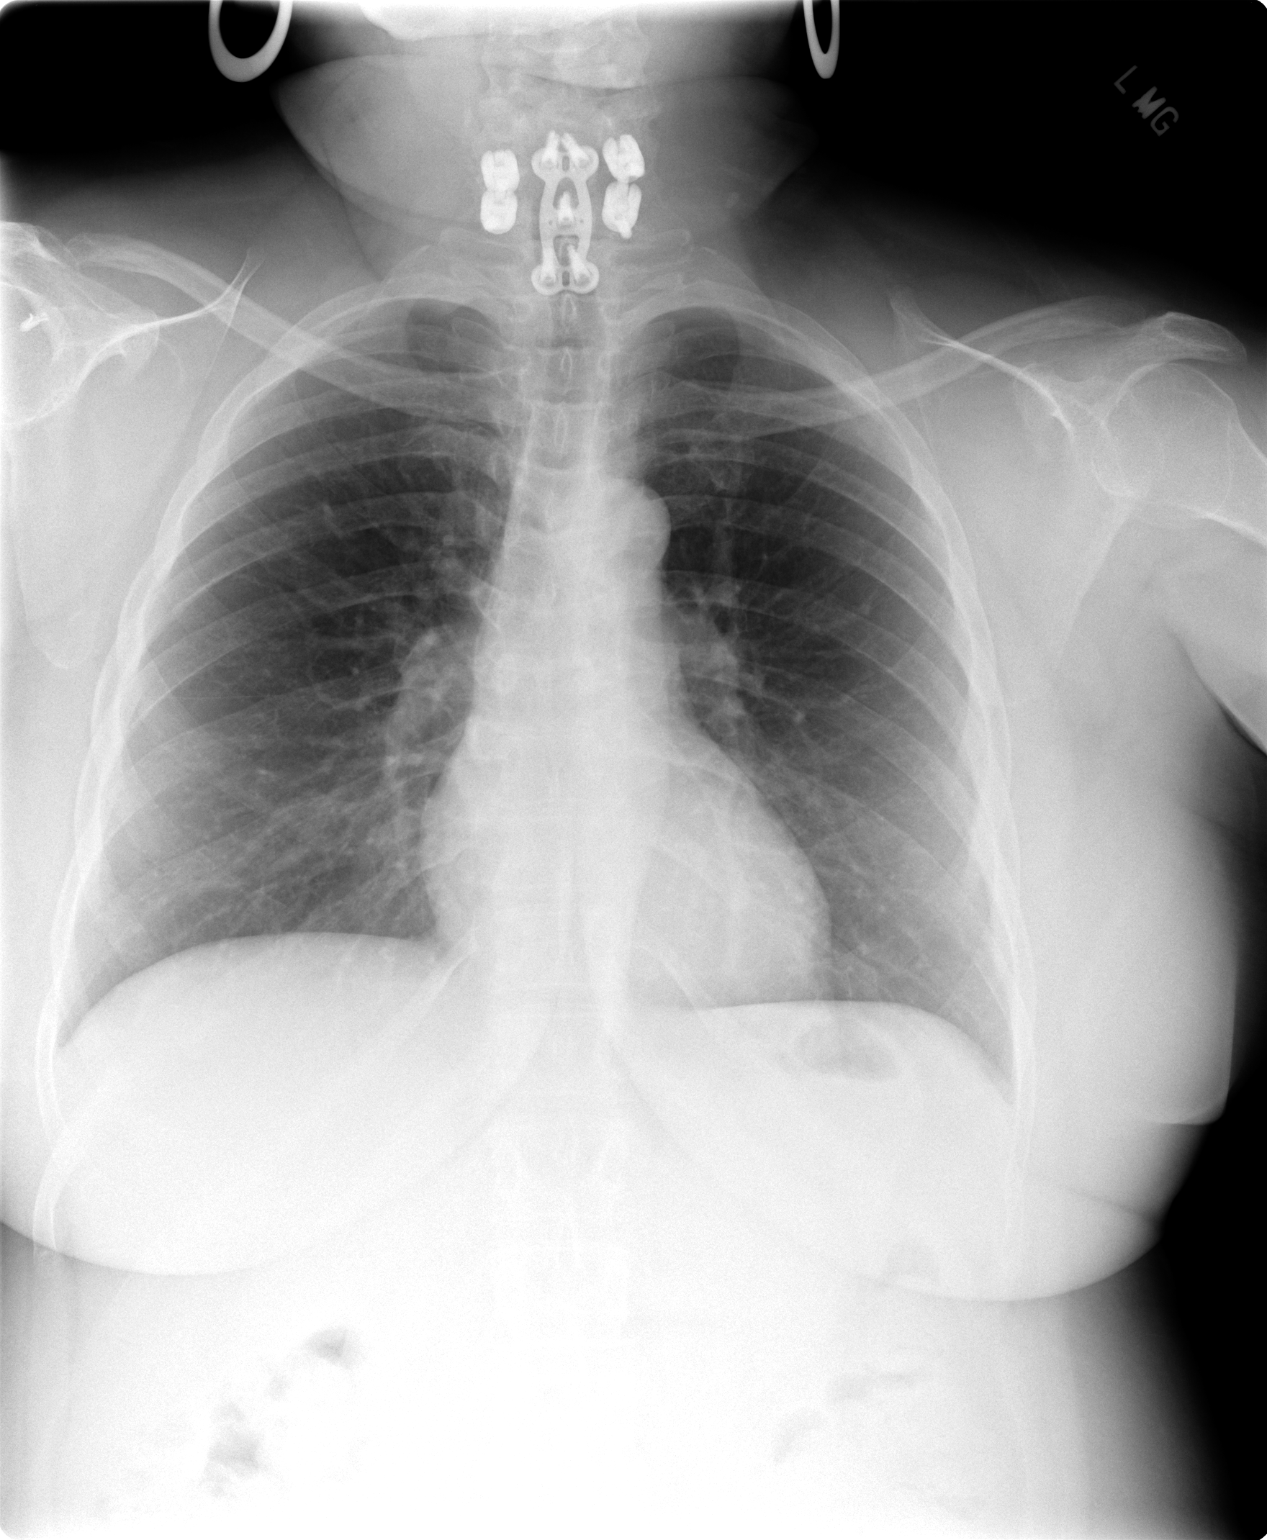

[view not recorded (2 of 2)]
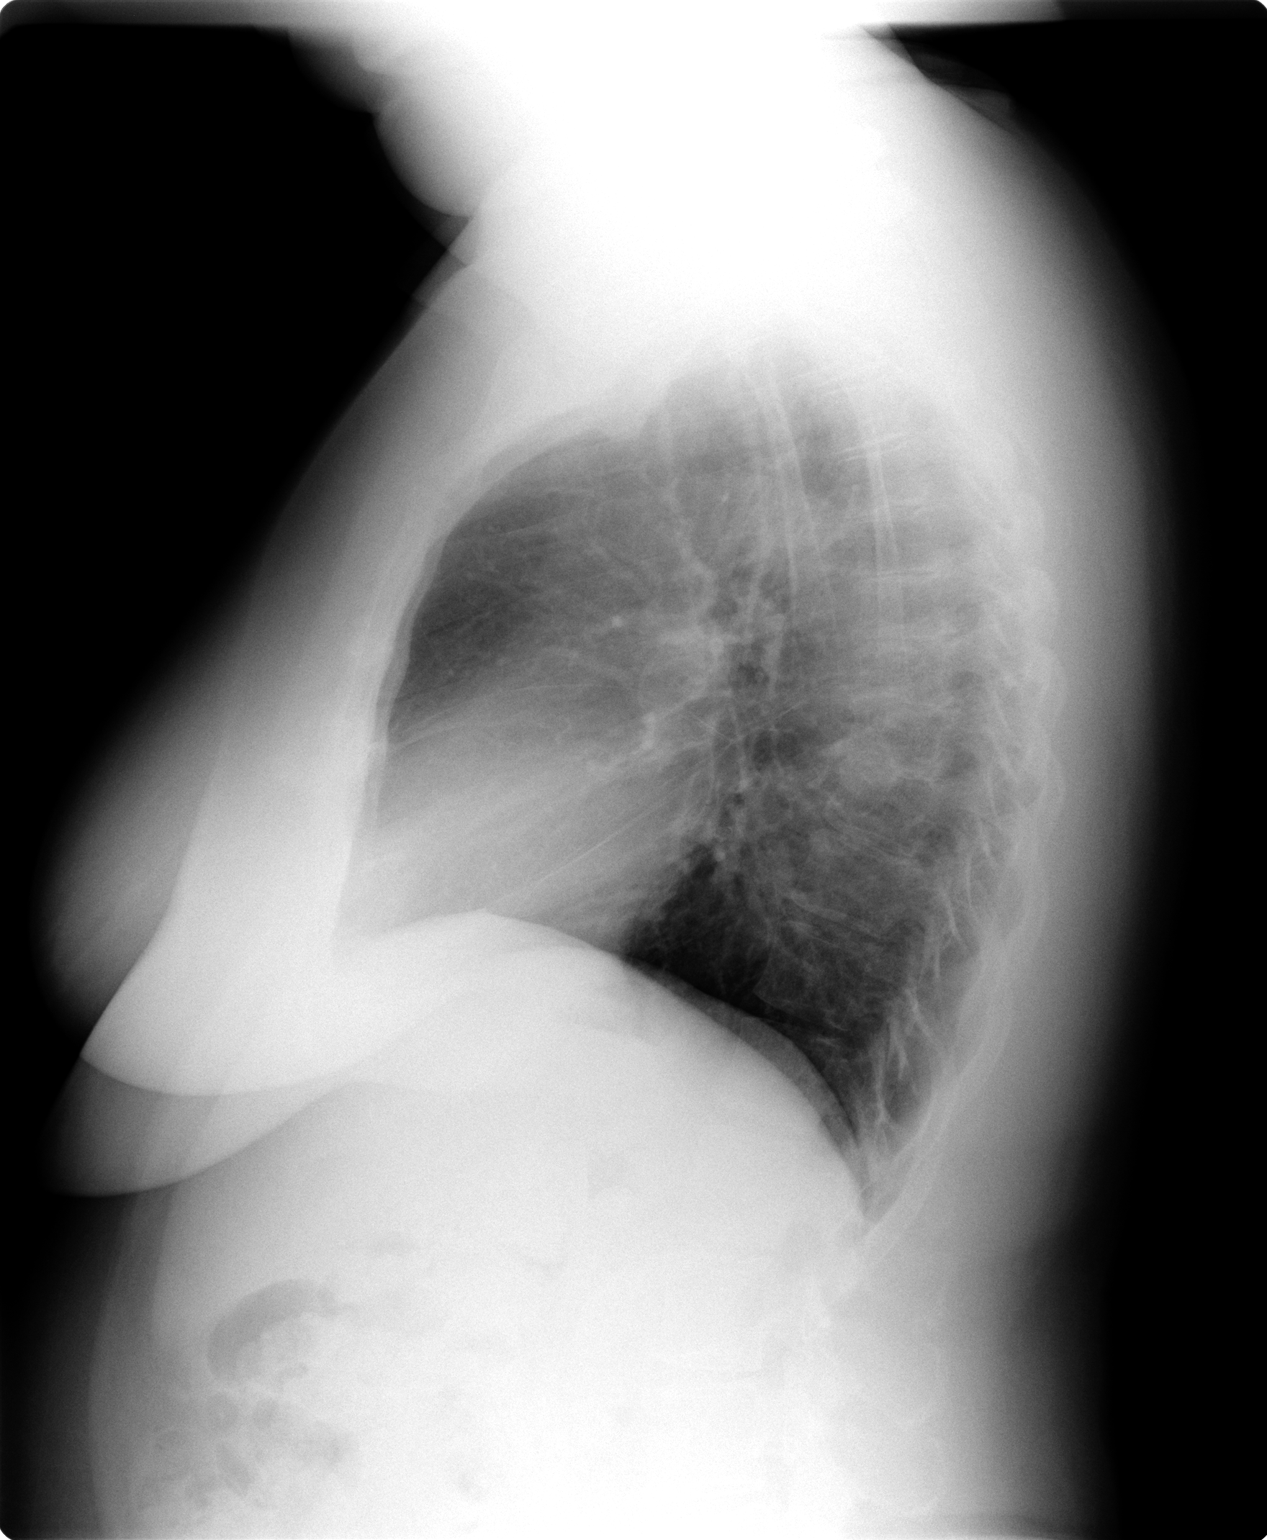

[2 of 2 positions shown; findings below may reference images not displayed]

## 2006-10-02 ENCOUNTER — Encounter: Admission: RE | Admit: 2006-10-02 | Discharge: 2006-10-02 | Payer: Self-pay | Admitting: Emergency Medicine

## 2006-10-13 IMAGING — CT CT CERVICAL SPINE W/O CM
1 of 5 series · 6 of 14 positions shown, 8 images · non-contrast
Comparison: none

CLINICAL DATA: Cervical pain, status post fusion in July 2003.
CT CERVICAL SPINE WITHOUT CONTRAST ? 12/02/03
The patient is status post fusion at C5 to C7 anteriorly.  Posterior fusion is also noted at C6 and C7.  The inferior-posterior screw on the left at the C7 level does encroach on the dorsal aspect of the left neural foramen somewhat.  This may not be clinically relevant.  There is facet disease noted on the left at C4-5 as well as C7-T1.  There is small cyst formation noted in the superior left facet at C7-T1.
No acute bony abnormality is identified.  Alignment is normal.  Prevertebral soft tissues are normal.

[Series 102: cervical spine · axial · 0.23mm/px · z∈[-296,-172]mm · 6 of 279 slices shown, 8 images]
[im 40/279  soft-tissue]
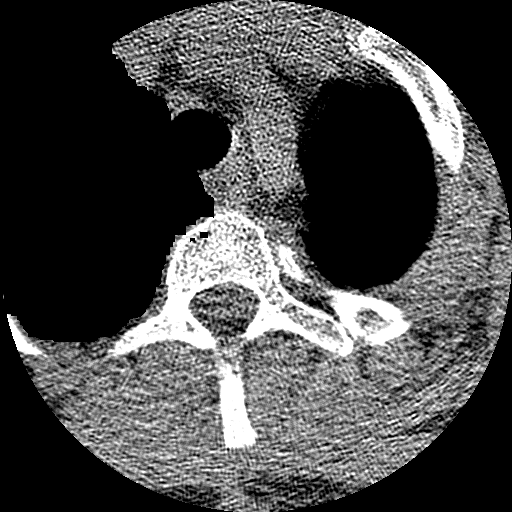
[im 40/279  bone]
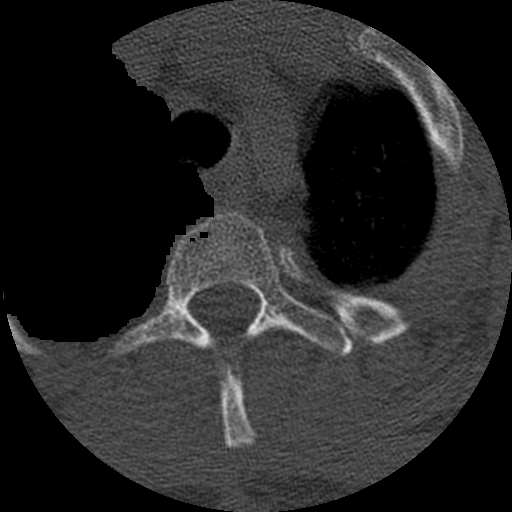
[im 80/279  bone]
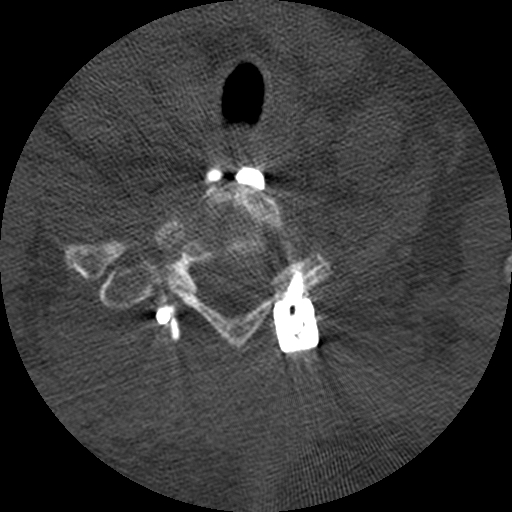
[im 120/279  bone]
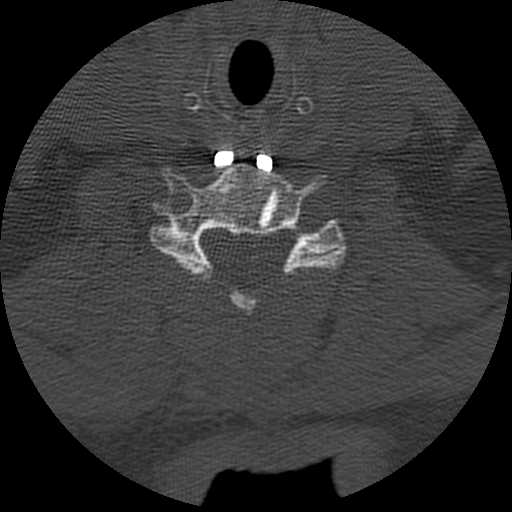
[im 159/279  bone]
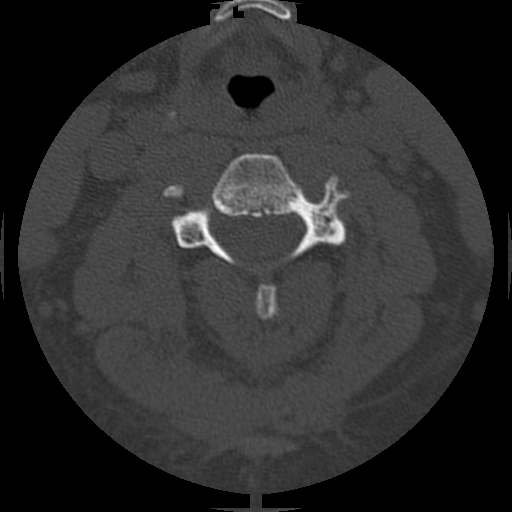
[im 199/279  soft-tissue]
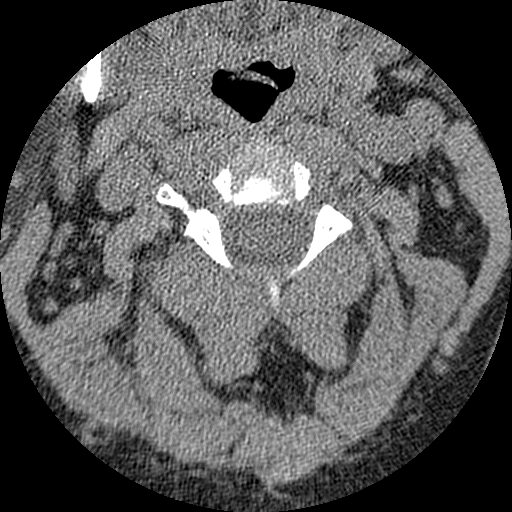
[im 199/279  bone]
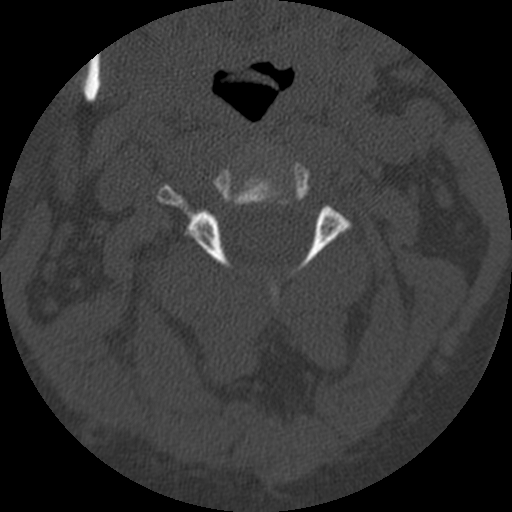
[im 239/279  bone]
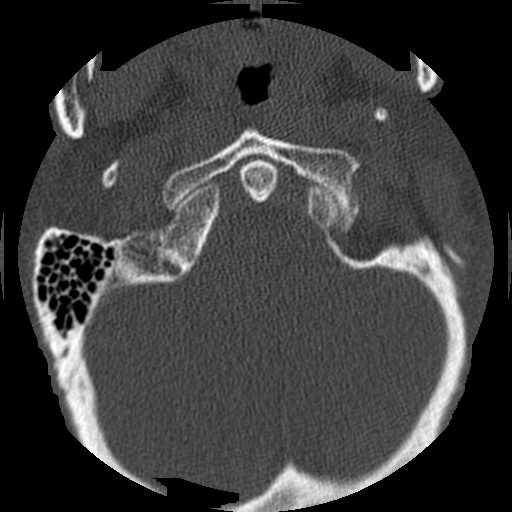

[6 of 14 positions shown; findings below may reference images not displayed]

IMPRESSION: 1.  Status post ACDF from C5 to C7 and posterior fusion at C6 and C7.
2.  The left posterior screw at C7 does encroach somewhat on the dorsal aspect of the neural foramen, but question clinical relevance.
3.  Moderate facet disease particularly on the left at C4-5 and C7-T1.
4.  No acute findings.

## 2006-11-19 ENCOUNTER — Emergency Department (HOSPITAL_COMMUNITY): Admission: EM | Admit: 2006-11-19 | Discharge: 2006-11-19 | Payer: Self-pay | Admitting: Emergency Medicine

## 2007-01-05 ENCOUNTER — Encounter: Admission: RE | Admit: 2007-01-05 | Discharge: 2007-01-05 | Payer: Self-pay | Admitting: Emergency Medicine

## 2007-01-21 ENCOUNTER — Encounter: Admission: RE | Admit: 2007-01-21 | Discharge: 2007-01-21 | Payer: Self-pay | Admitting: Emergency Medicine

## 2007-02-11 ENCOUNTER — Encounter: Admission: RE | Admit: 2007-02-11 | Discharge: 2007-02-11 | Payer: Self-pay | Admitting: Emergency Medicine

## 2007-03-02 ENCOUNTER — Encounter: Admission: RE | Admit: 2007-03-02 | Discharge: 2007-03-02 | Payer: Self-pay | Admitting: Emergency Medicine

## 2007-04-06 ENCOUNTER — Emergency Department (HOSPITAL_COMMUNITY): Admission: EM | Admit: 2007-04-06 | Discharge: 2007-04-06 | Payer: Self-pay | Admitting: Emergency Medicine

## 2007-05-10 ENCOUNTER — Emergency Department (HOSPITAL_COMMUNITY): Admission: EM | Admit: 2007-05-10 | Discharge: 2007-05-10 | Payer: Self-pay | Admitting: Emergency Medicine

## 2007-05-15 ENCOUNTER — Inpatient Hospital Stay (HOSPITAL_COMMUNITY): Admission: EM | Admit: 2007-05-15 | Discharge: 2007-05-17 | Payer: Self-pay | Admitting: Emergency Medicine

## 2007-08-28 IMAGING — CR DG CHEST 2V
2 series · 2 of 2 positions shown · non-contrast
Comparison: 10/27/03.

CLINICAL DATA: Dyspnea.  Right-sided chest pain.  Short of breath.
 CHEST ? 2 VIEW:

[w chest pa]
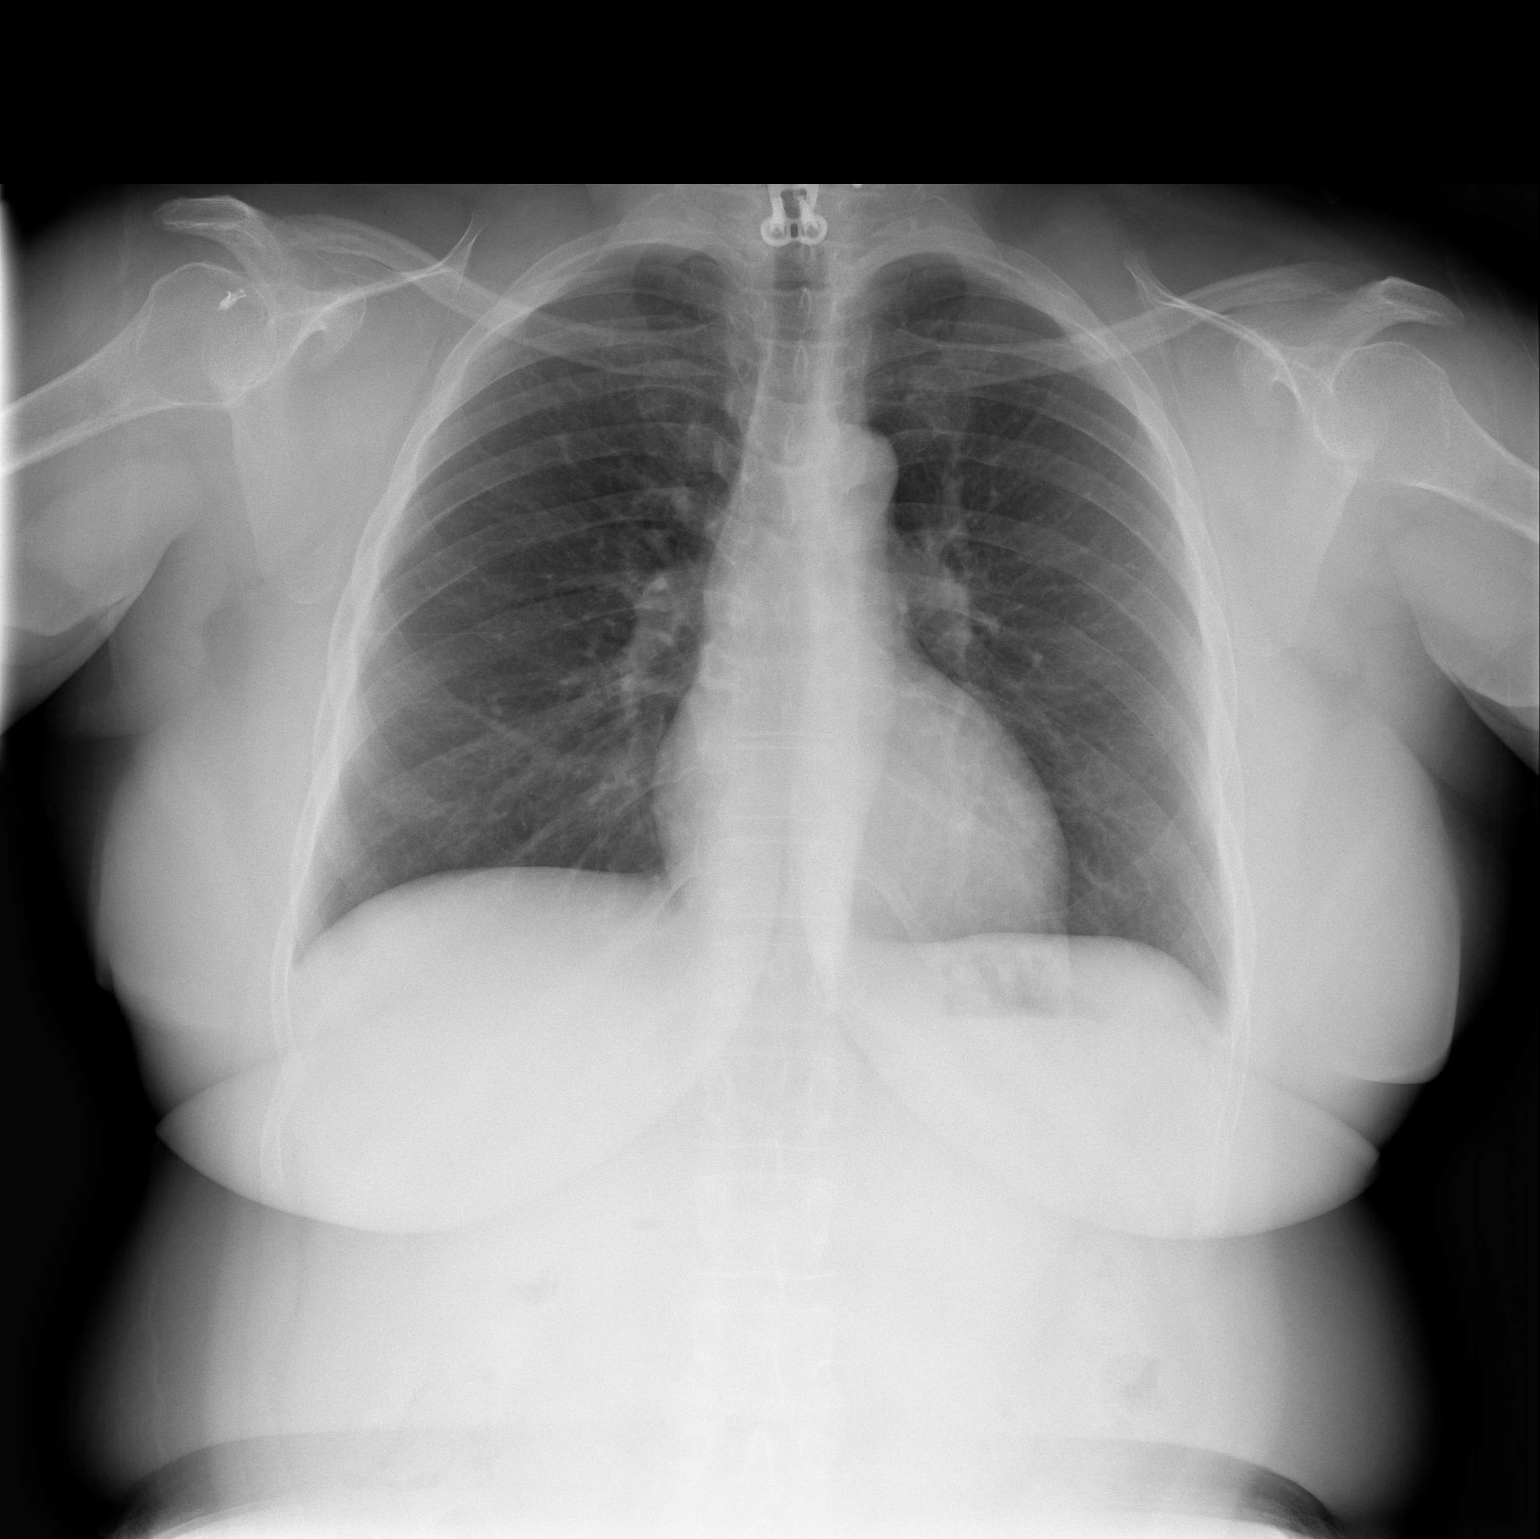

[w chest lat]
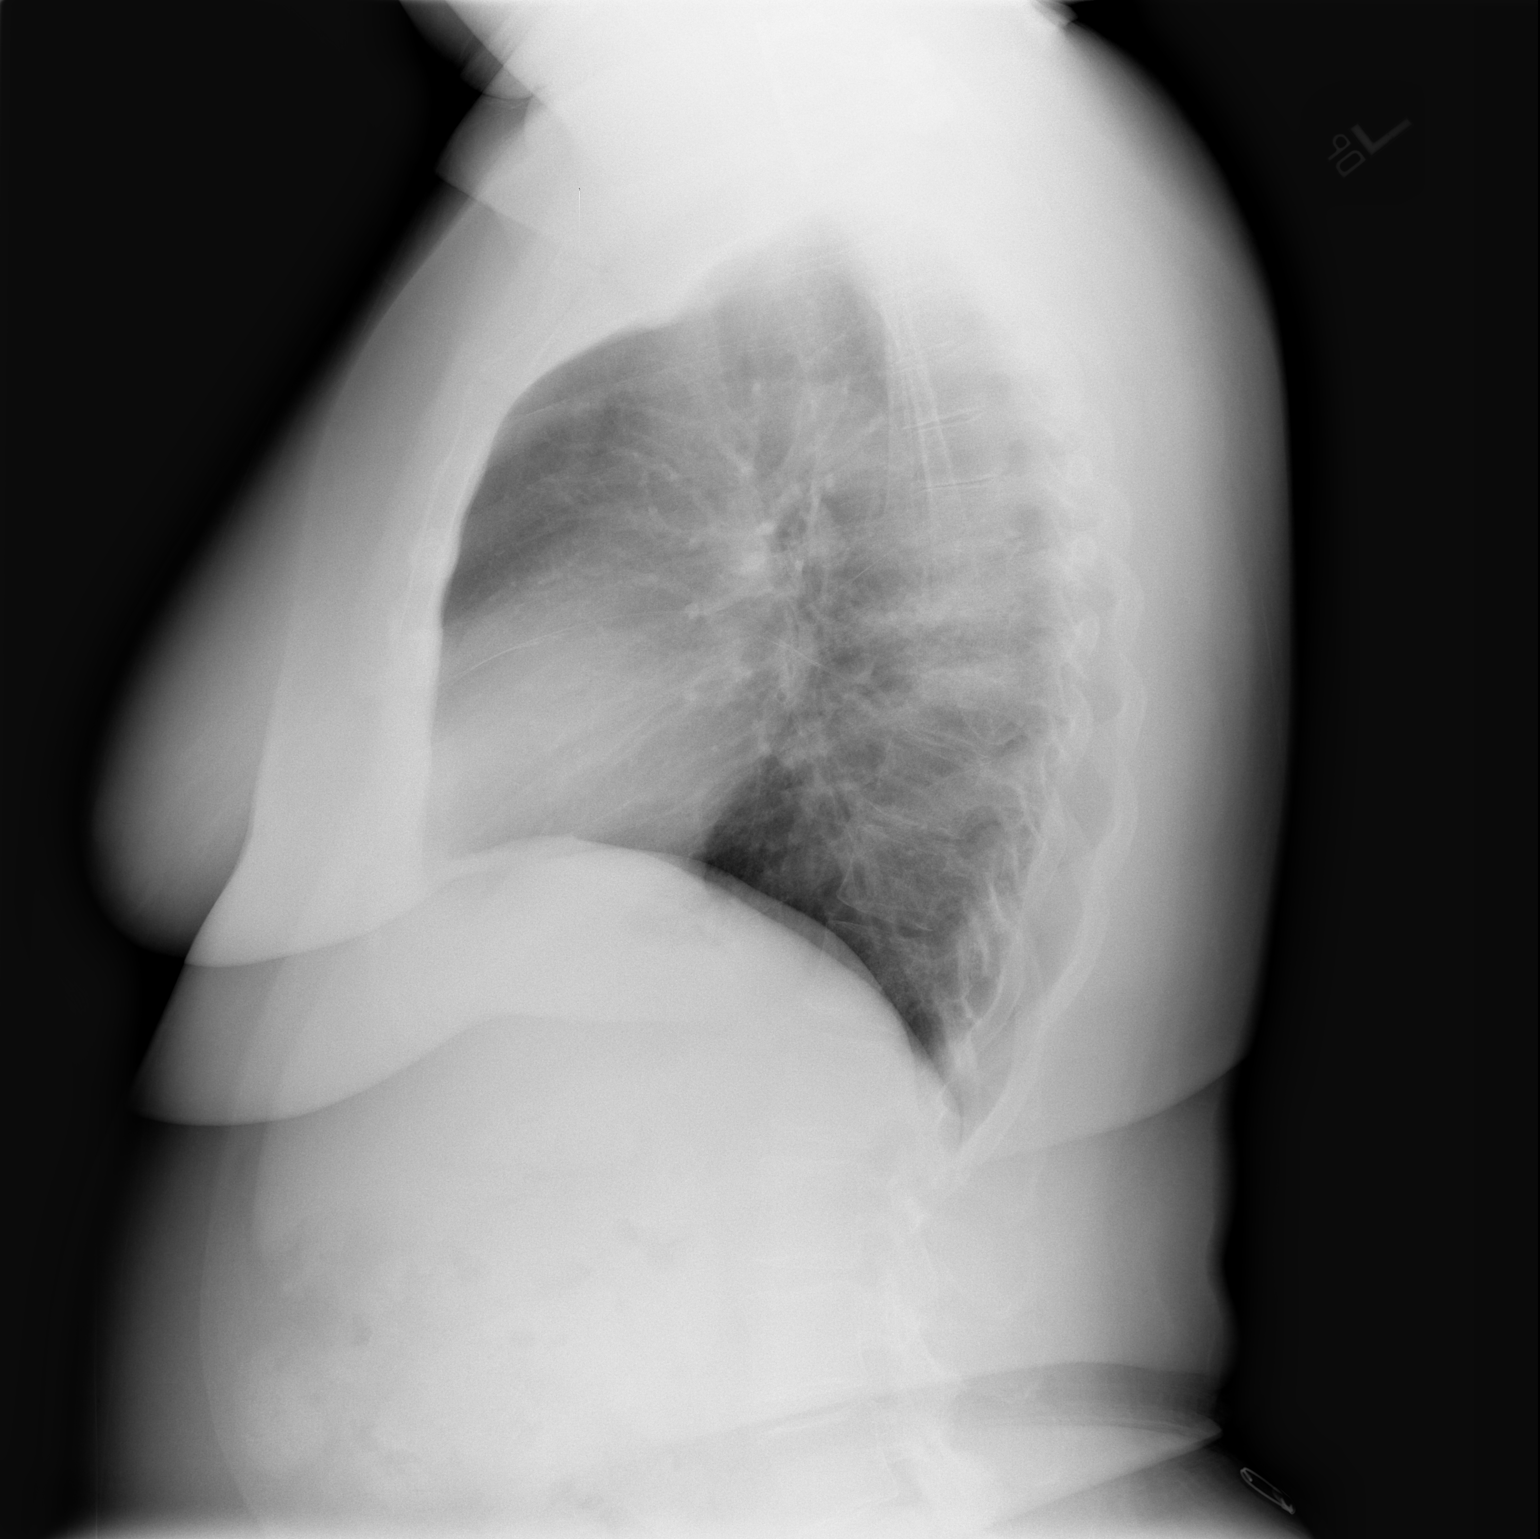

[2 of 2 positions shown; findings below may reference images not displayed]

FINDINGS: The heart size is normal.  The mediastinum is unremarkable.  The lungs are clear.  The patient has had rotator cuff surgery on the right and has had cervical spine fusion.
IMPRESSION: No active disease.

## 2007-12-12 IMAGING — CT CT HEAD W/O CM
1 series · 16 of 30 positions shown, 20 images · IV contrast (agent unspecified)
Comparison: 09/13/03.

CLINICAL DATA: Headache with nausea and vomiting.  
 HEAD CT WITHOUT CONTRAST:
TECHNIQUE: Contiguous axial images were obtained from the base of the skull through the vertex according to standard protocol without contrast.

[Series 2: headseq 4.8 h45s · axial · 0.41mm/px · z∈[-202,-62]mm · 16 of 33 slices shown, 20 images]
[im 2/33  brain]
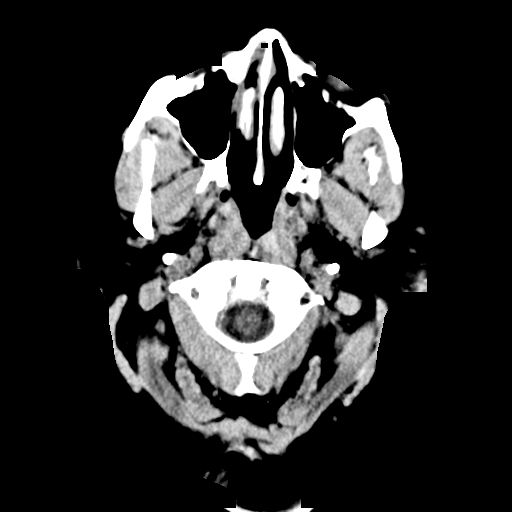
[im 2/33  bone]
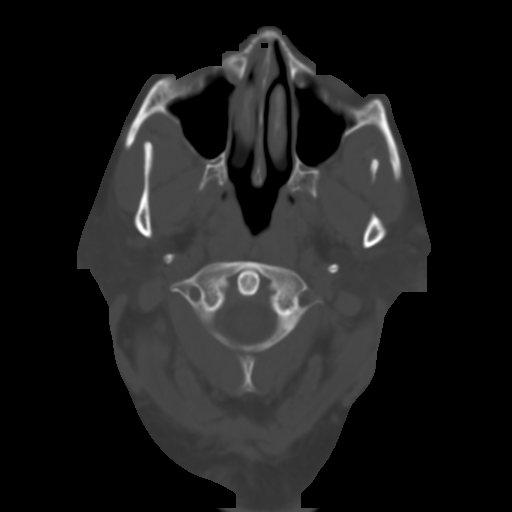
[im 4/33  brain]
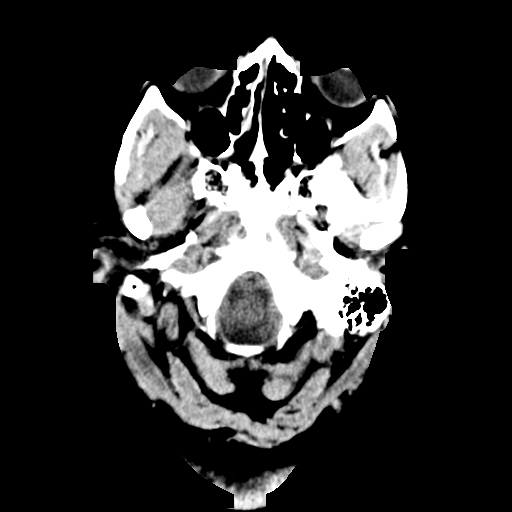
[im 6/33  brain]
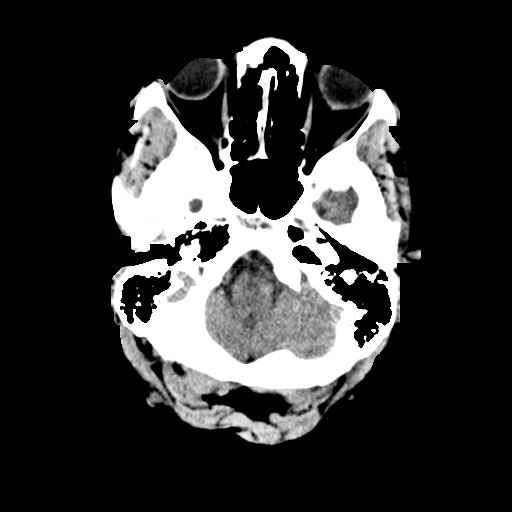
[im 8/33  brain]
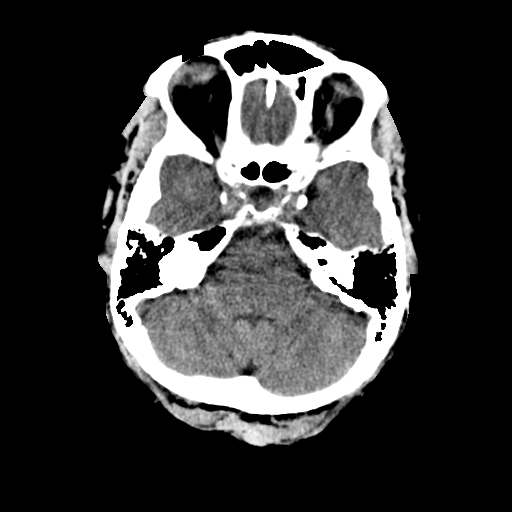
[im 9/33  brain]
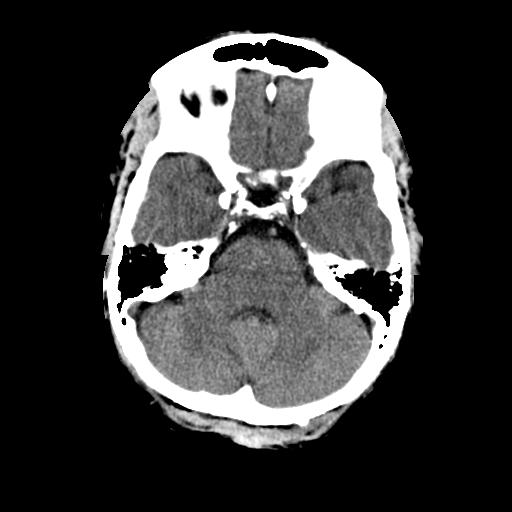
[im 9/33  bone]
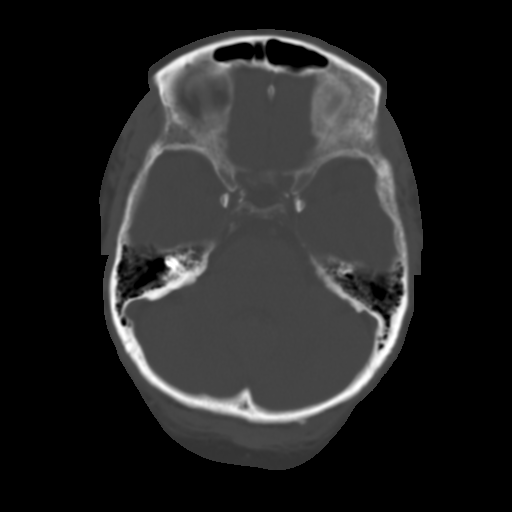
[im 12/33  brain]
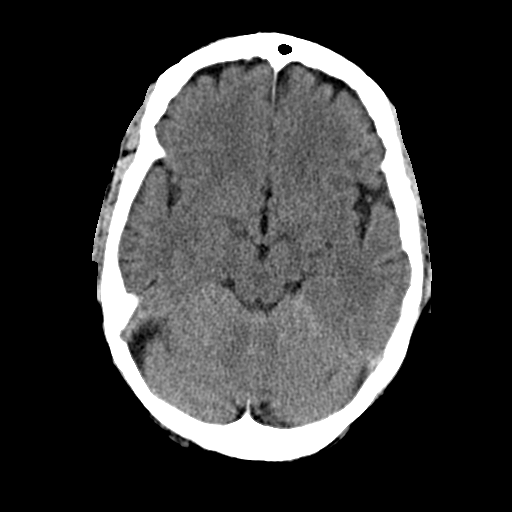
[im 14/33  brain]
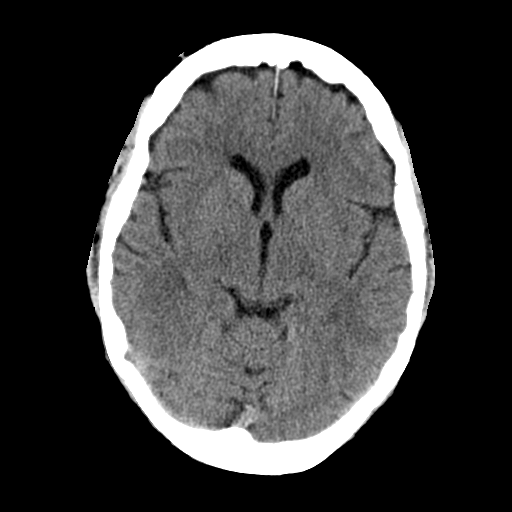
[im 16/33  brain]
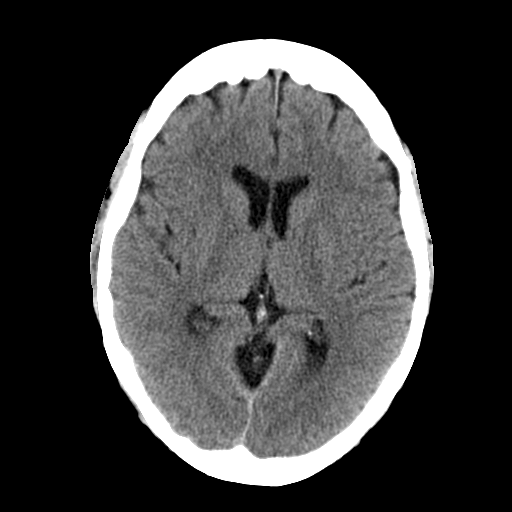
[im 17/33  brain]
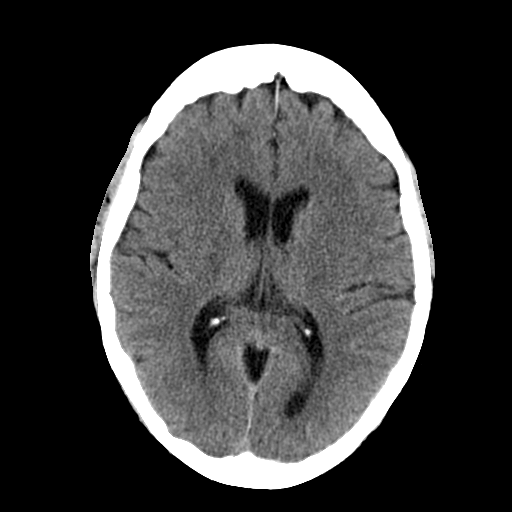
[im 17/33  bone]
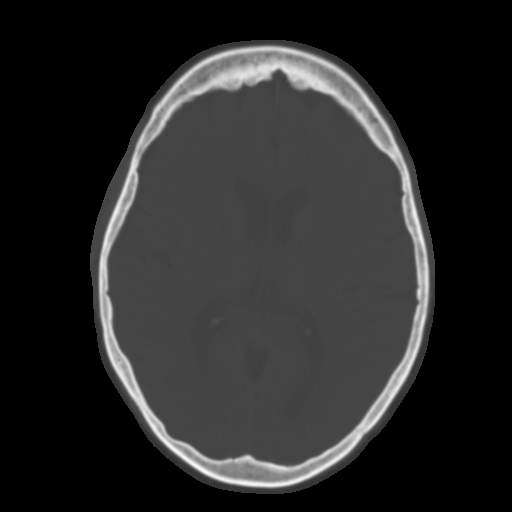
[im 19/33  brain]
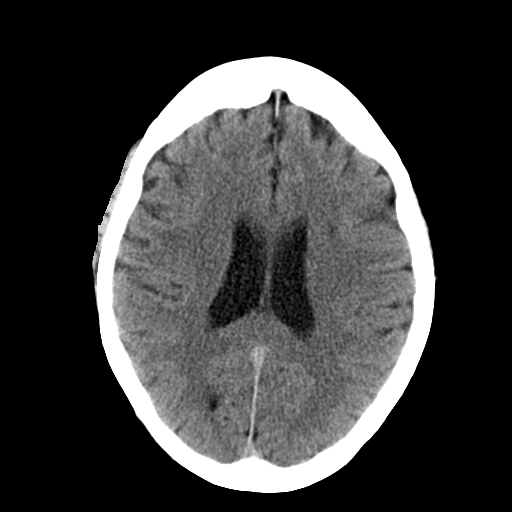
[im 21/33  brain]
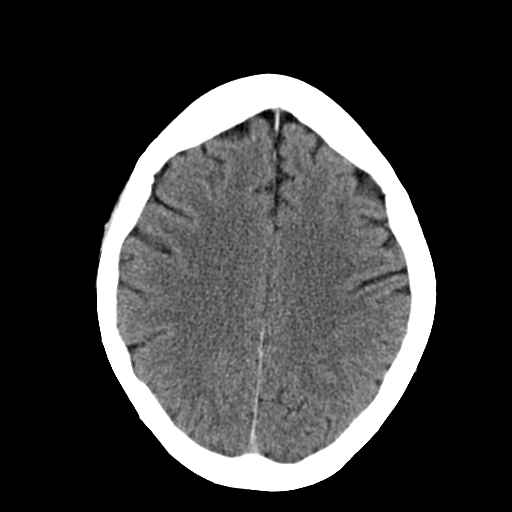
[im 24/33  brain]
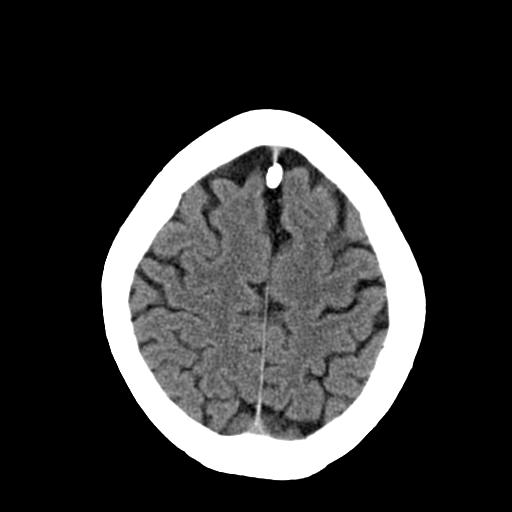
[im 25/33  brain]
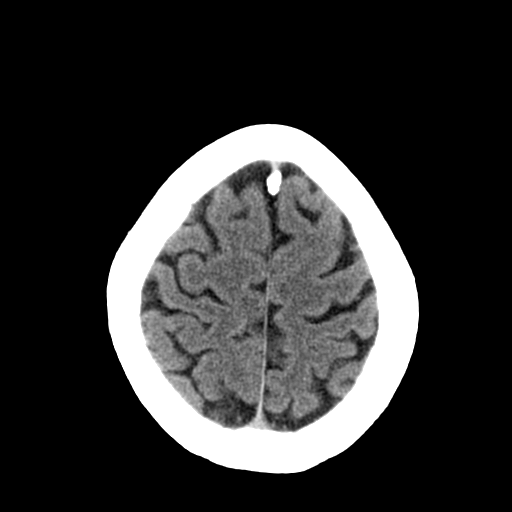
[im 25/33  bone]
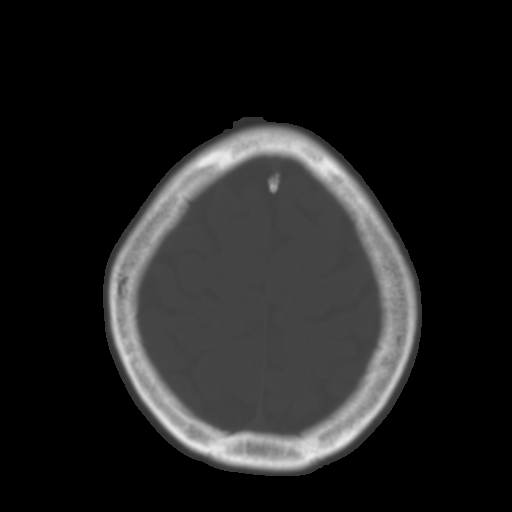
[im 27/33  brain]
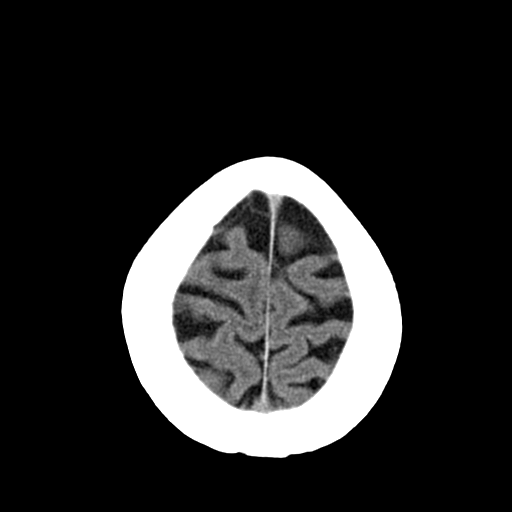
[im 29/33  brain]
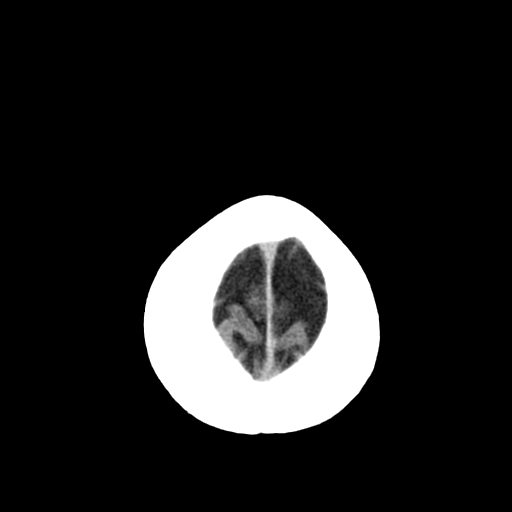
[im 31/33  brain]
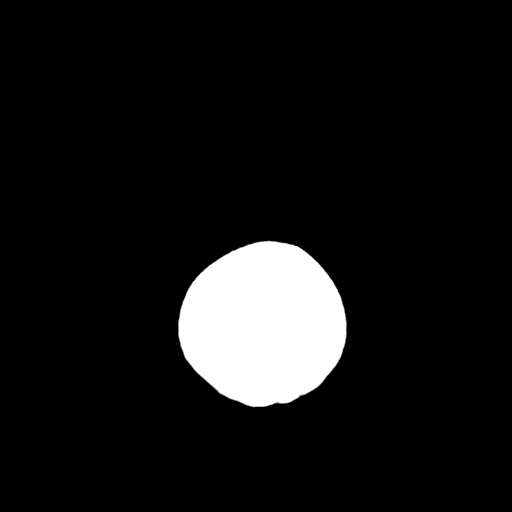

[16 of 30 positions shown; findings below may reference images not displayed]

FINDINGS: Ventricle size and CSF space is normal.  No acute or focal intracranial abnormality.  No evidence for intracranial hemorrhage.  
 Calvarium intact.  No fluid in the sinuses visualized.
IMPRESSION: No acute or focal abnormality.

## 2007-12-12 IMAGING — CR DG CHEST 2V
2 series · 2 of 2 positions shown · non-contrast
Comparison: 10/16/04
 The heart size and mediastinal contours are within normal limits.  Both lungs are clear.  The visualized skeletal structures are unremarkable.

CLINICAL DATA: chest pain
 CHEST - 2 VIEW:

[view not recorded (1 of 2)]
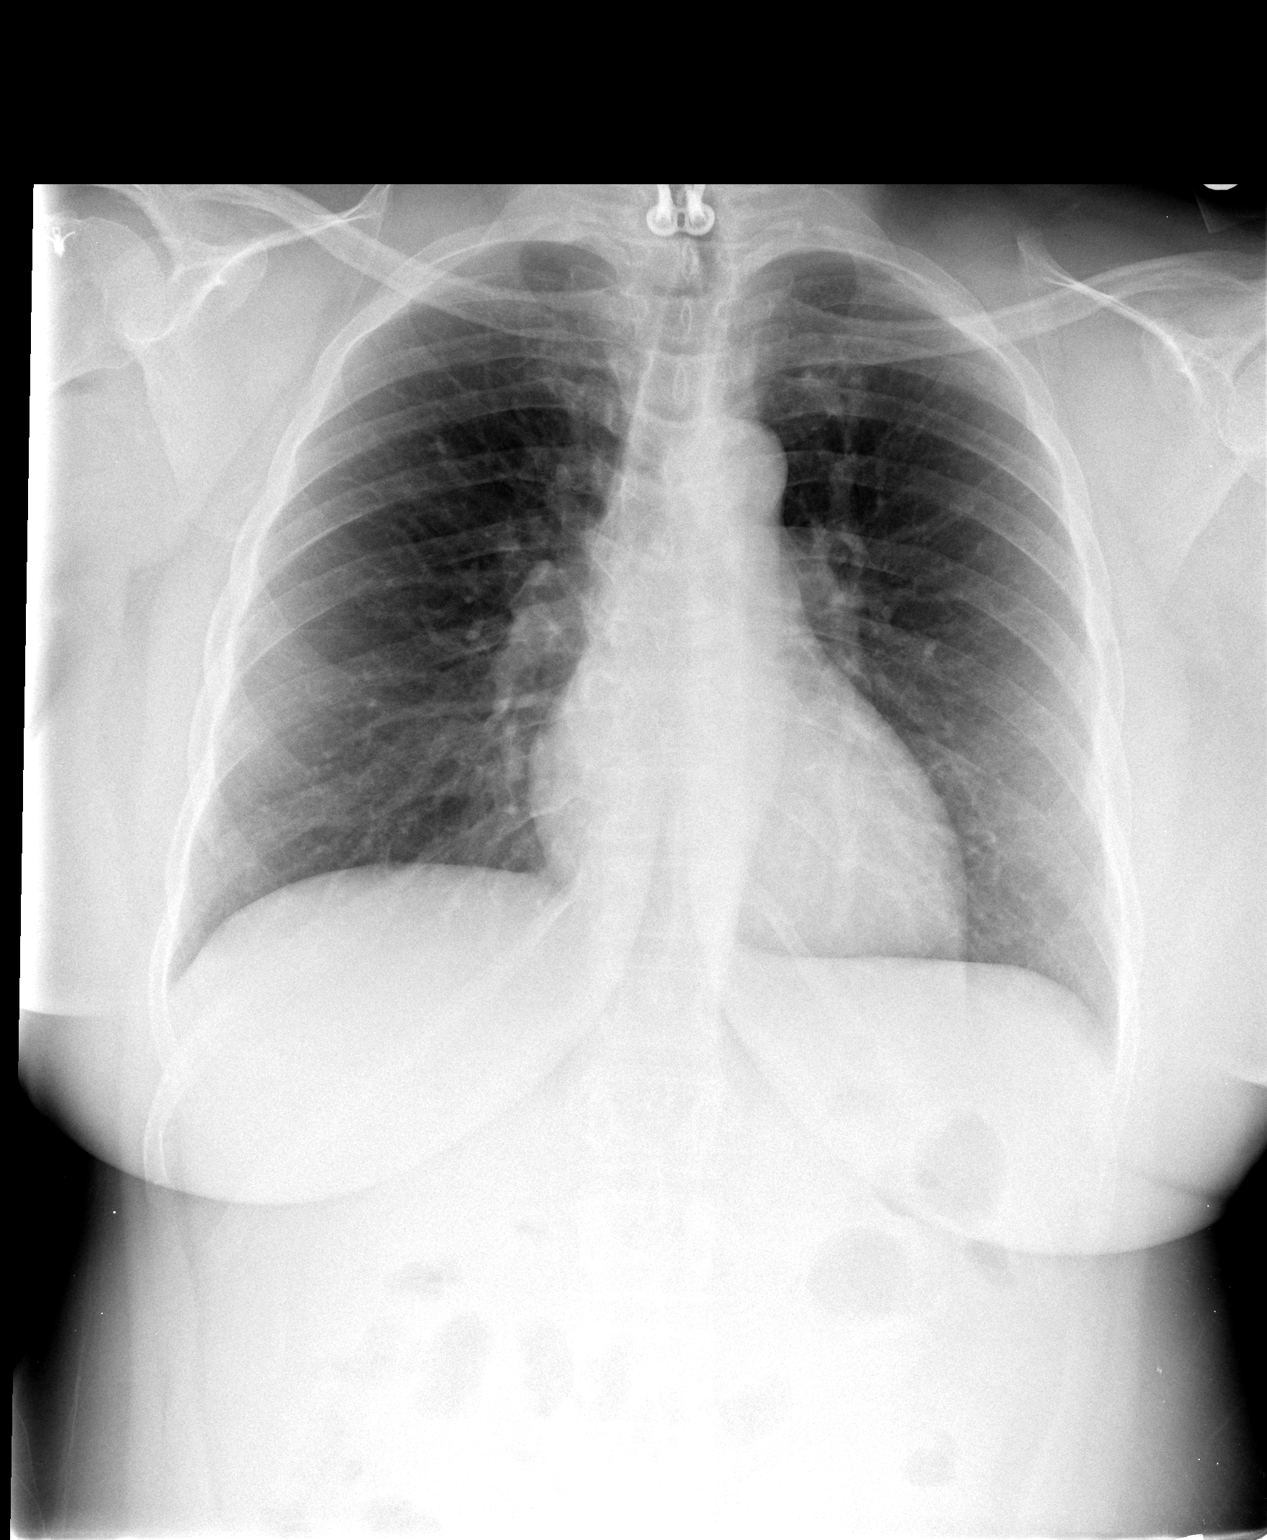

[view not recorded (2 of 2)]
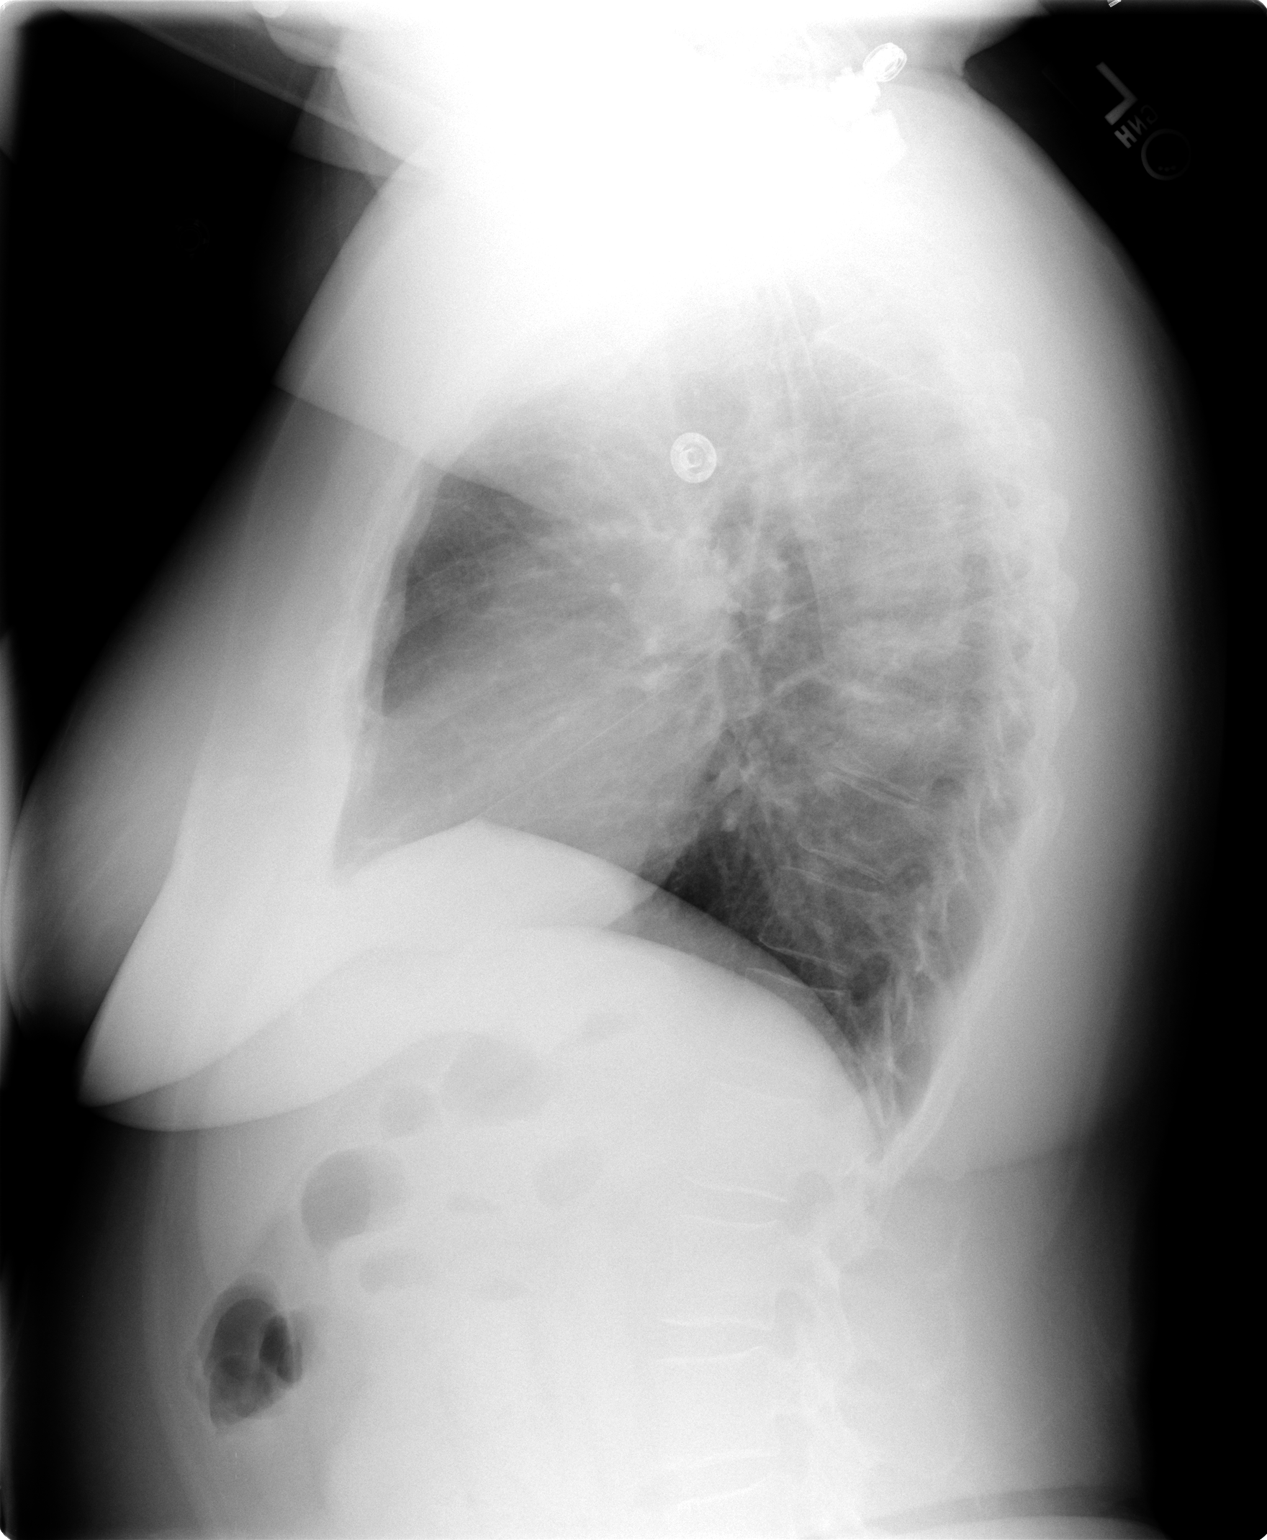

[2 of 2 positions shown; findings below may reference images not displayed]

IMPRESSION: No active cardiopulmonary disease and no interval change.

## 2008-01-06 ENCOUNTER — Encounter: Admission: RE | Admit: 2008-01-06 | Discharge: 2008-01-06 | Payer: Self-pay | Admitting: Emergency Medicine

## 2008-01-24 ENCOUNTER — Emergency Department (HOSPITAL_COMMUNITY): Admission: EM | Admit: 2008-01-24 | Discharge: 2008-01-24 | Payer: Self-pay | Admitting: Emergency Medicine

## 2008-02-29 ENCOUNTER — Emergency Department (HOSPITAL_COMMUNITY): Admission: EM | Admit: 2008-02-29 | Discharge: 2008-02-29 | Payer: Self-pay | Admitting: Emergency Medicine

## 2008-08-04 ENCOUNTER — Emergency Department (HOSPITAL_COMMUNITY): Admission: EM | Admit: 2008-08-04 | Discharge: 2008-08-04 | Payer: Self-pay | Admitting: Emergency Medicine

## 2008-08-12 ENCOUNTER — Encounter: Admission: RE | Admit: 2008-08-12 | Discharge: 2008-08-12 | Payer: Self-pay | Admitting: Family Medicine

## 2008-08-12 IMAGING — CR DG CHEST 2V
2 series · 2 of 2 positions shown · non-contrast
Comparison: 01/30/2005.

CLINICAL DATA: Dizziness.  Chest pain and frequent urination.  Nausea.  
 CHEST - 2 VIEW:

[w chest pa]
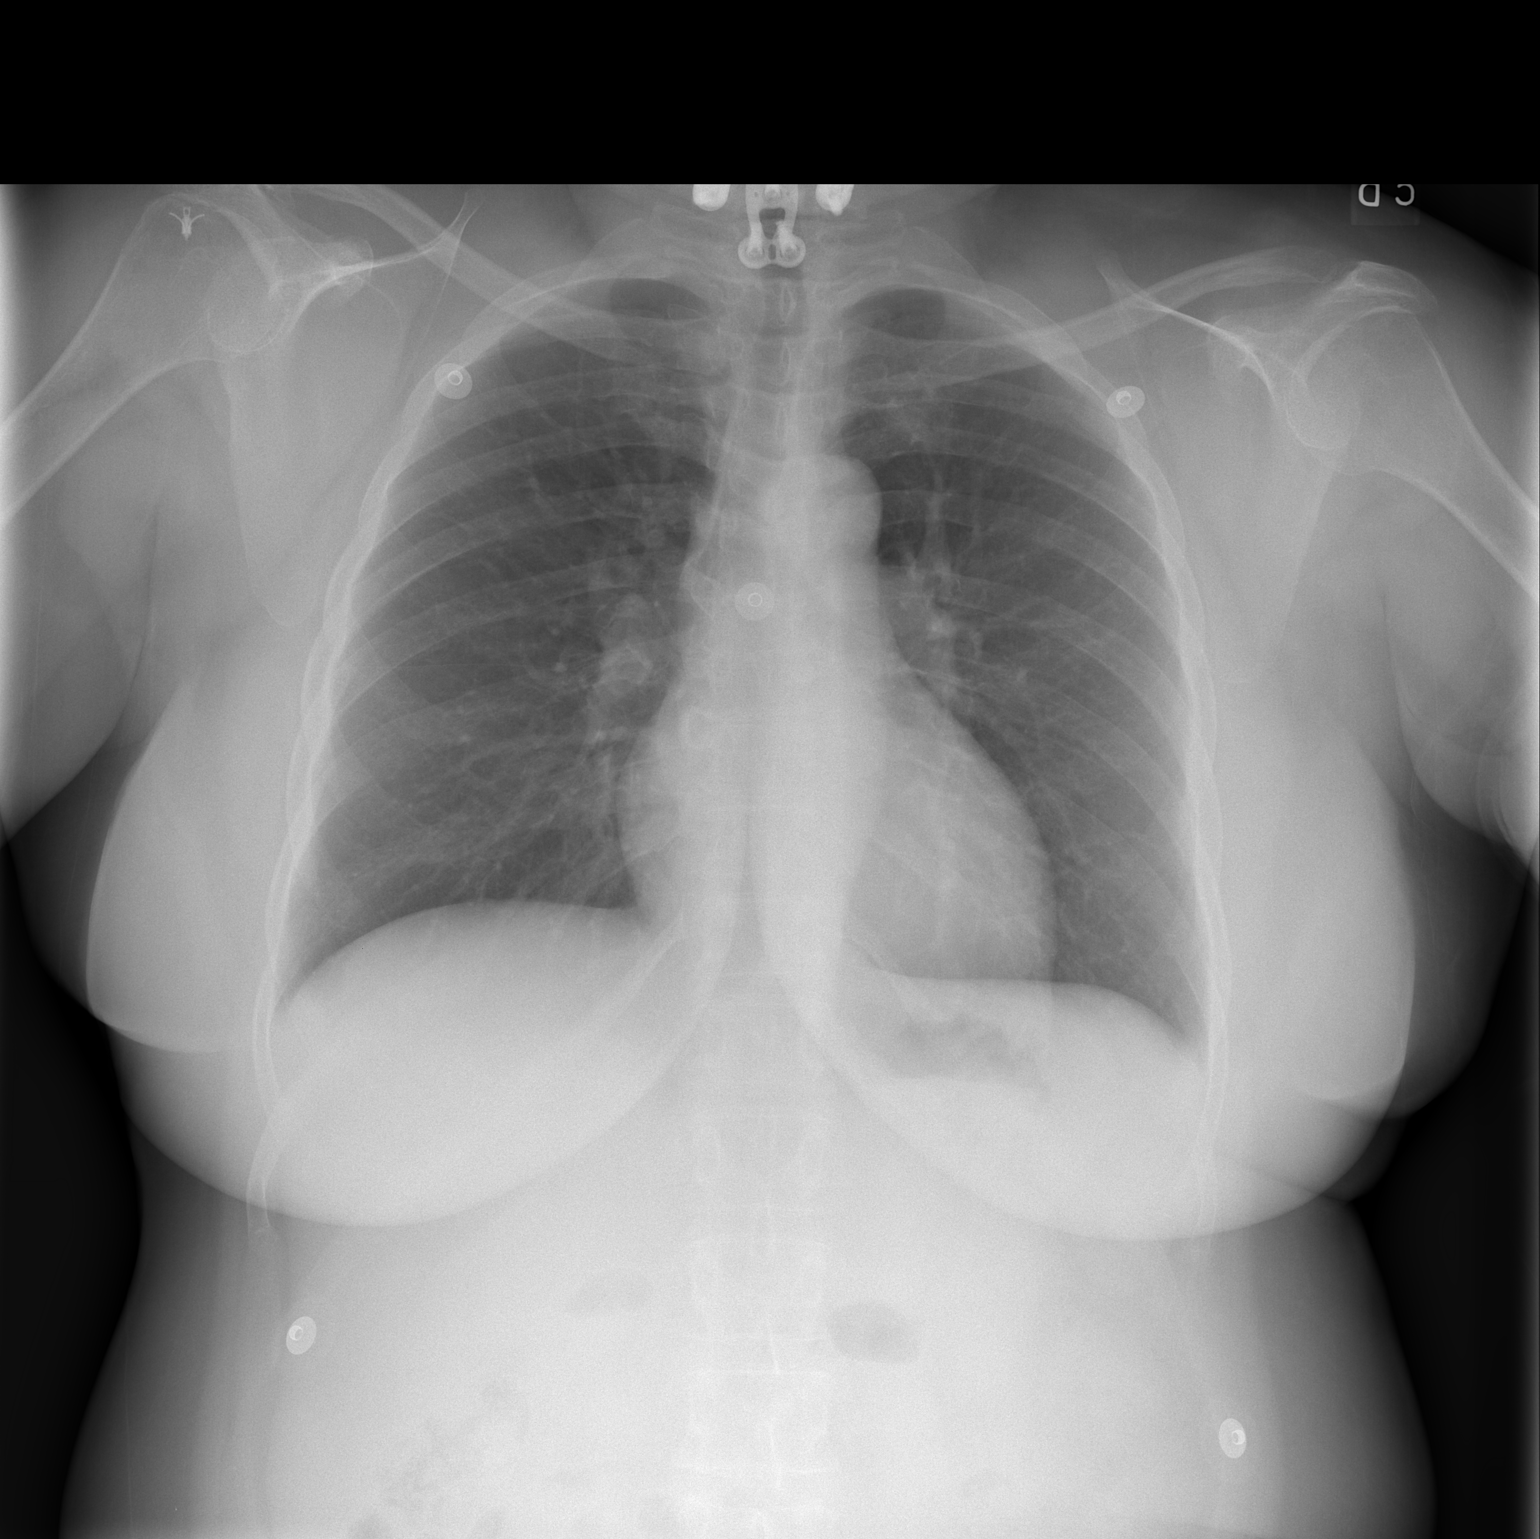

[w chest lat]
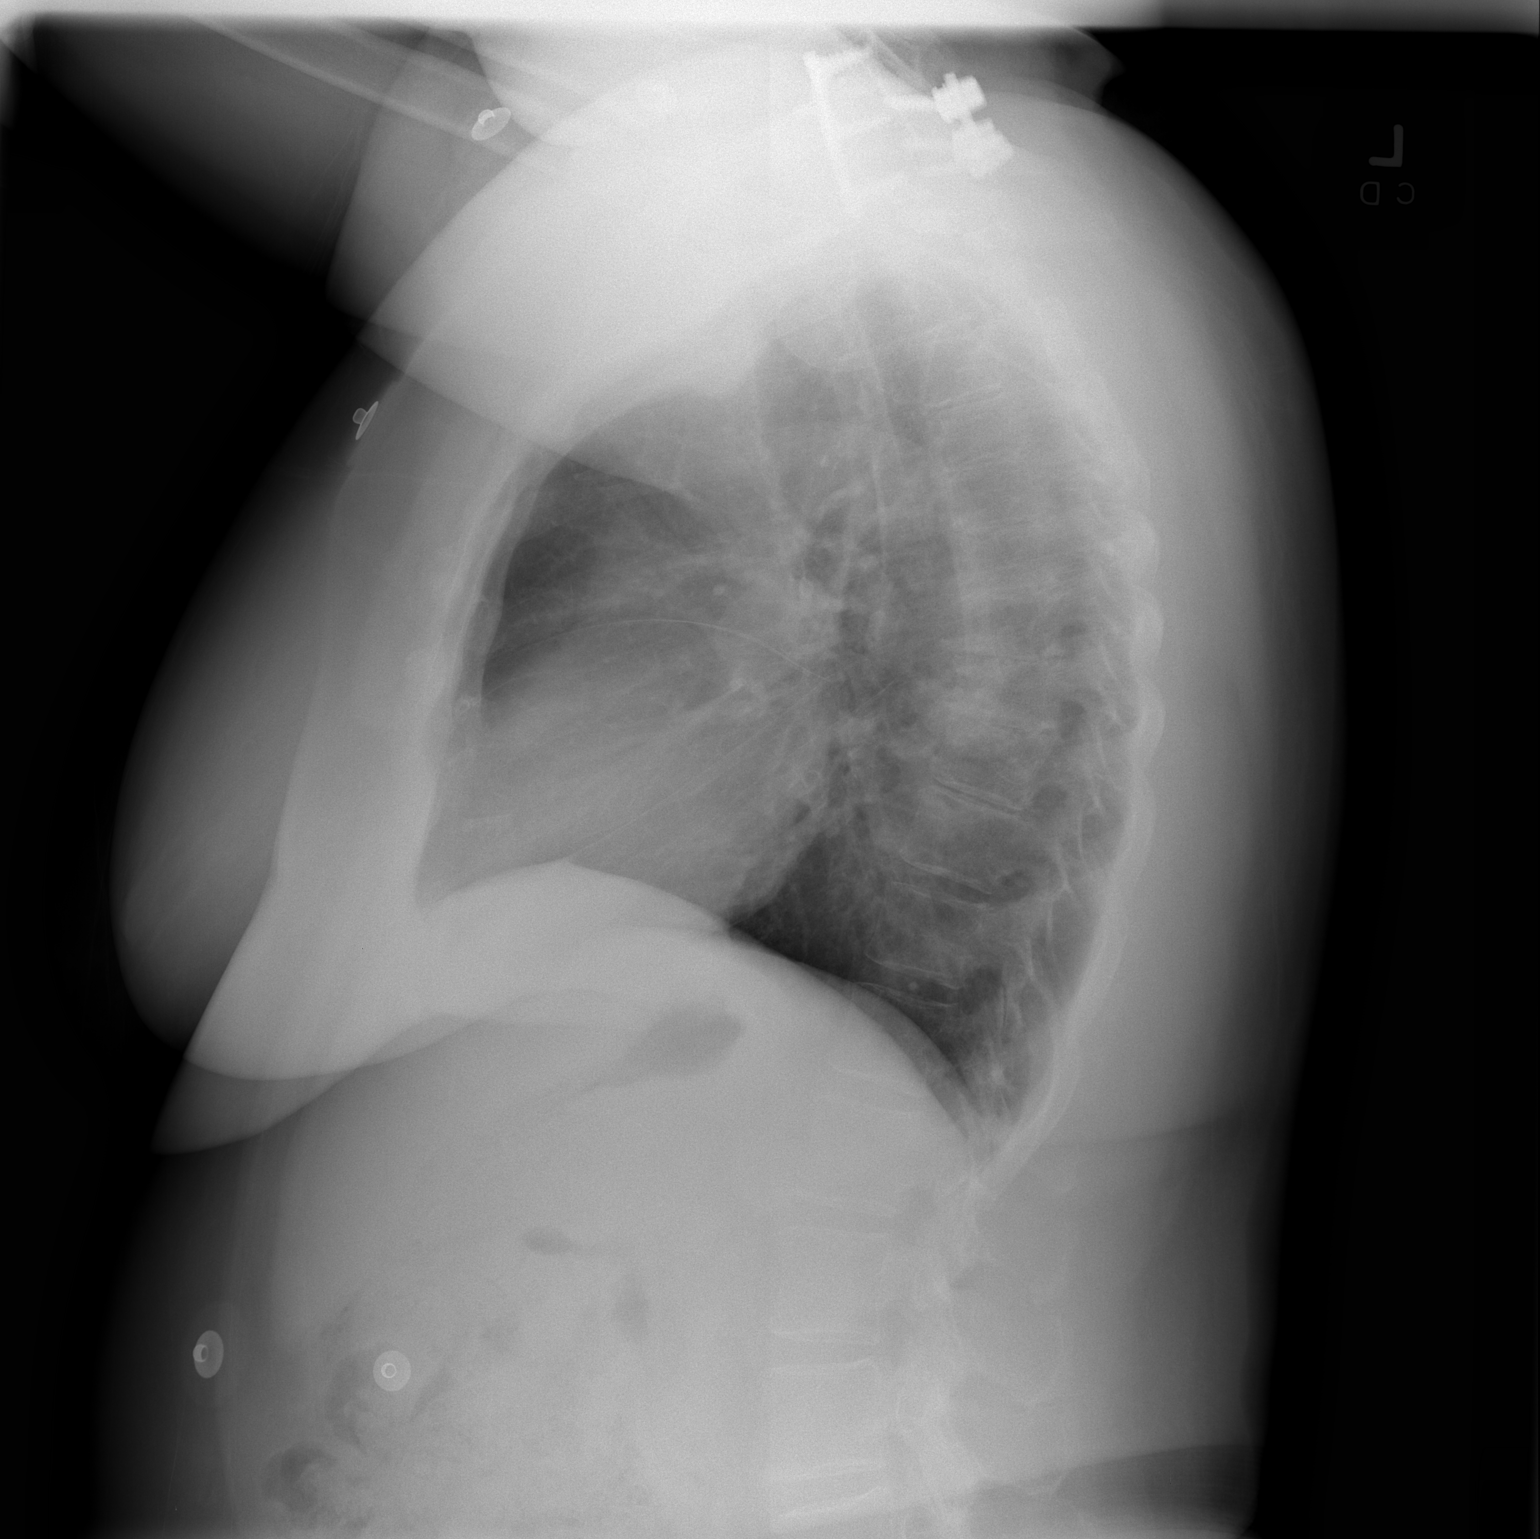

[2 of 2 positions shown; findings below may reference images not displayed]

FINDINGS: Heart size is normal.  There are no effusions or interstitial edema.  No airspace disease is noted.  Multilevel spondylosis is noted worse in the mid-thoracic spine.  This is similar in appearance to the prior exam.
IMPRESSION: 1.  No active disease.  
 2.  Multilevel thoracic spondylosis.

## 2008-08-13 IMAGING — CT CT HEAD W/O CM
1 series · 16 of 30 positions shown, 20 images · IV contrast (agent unspecified)
Comparison: 01/30/05.

CLINICAL DATA: Abdominal pain and dizziness.  
 HEAD CT WITHOUT CONTRAST:
TECHNIQUE: Contiguous axial images were obtained from the base of the skull through the vertex according to standard protocol without contrast.

[Series 2: head_seq 4.5 h45s st · axial · 0.43mm/px · z∈[-185,-59]mm · 16 of 32 slices shown, 20 images]
[im 2/32  brain]
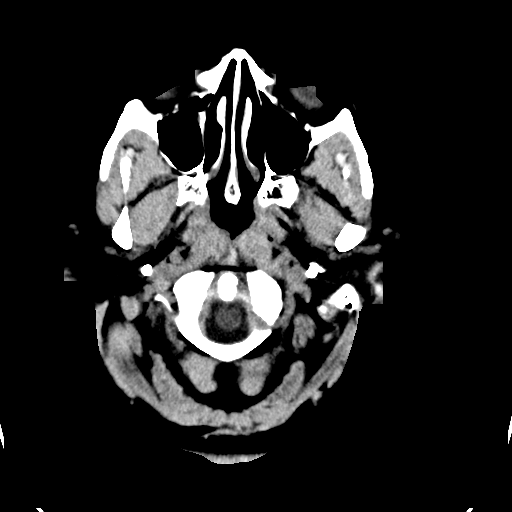
[im 2/32  bone]
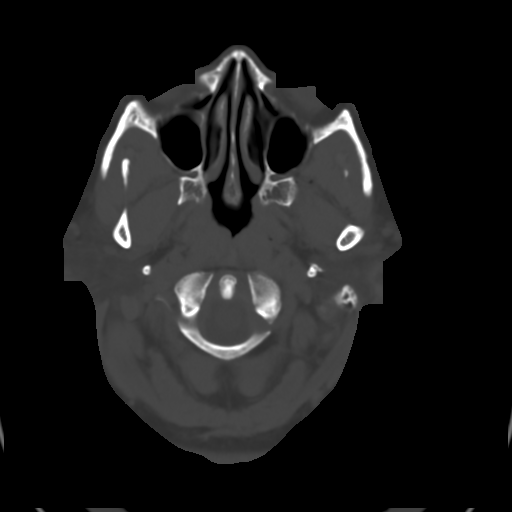
[im 4/32  brain]
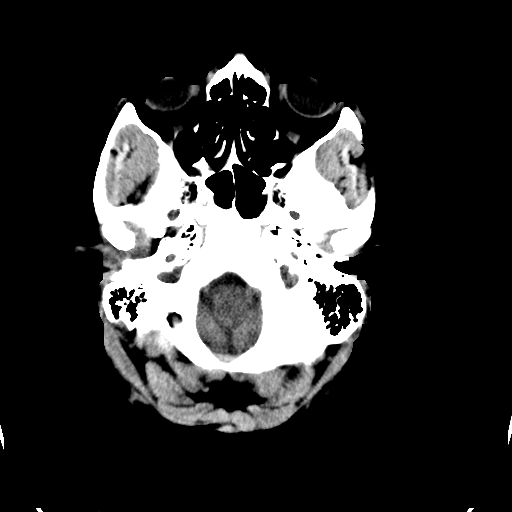
[im 6/32  brain]
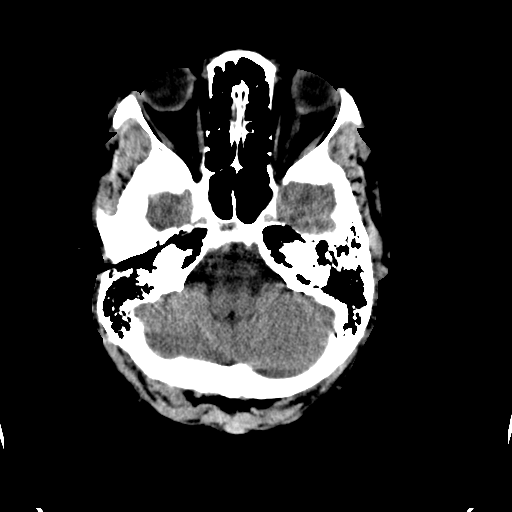
[im 8/32  brain]
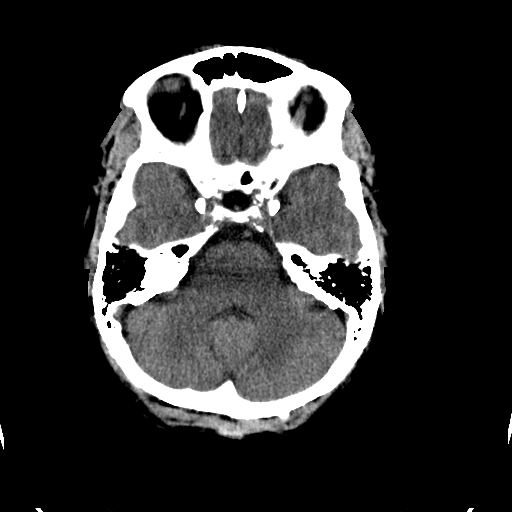
[im 9/32  brain]
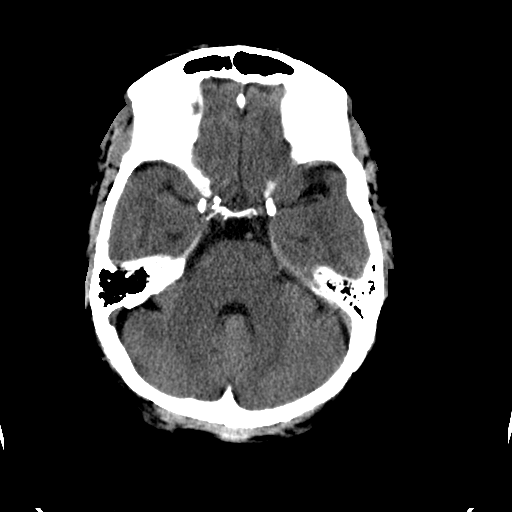
[im 9/32  bone]
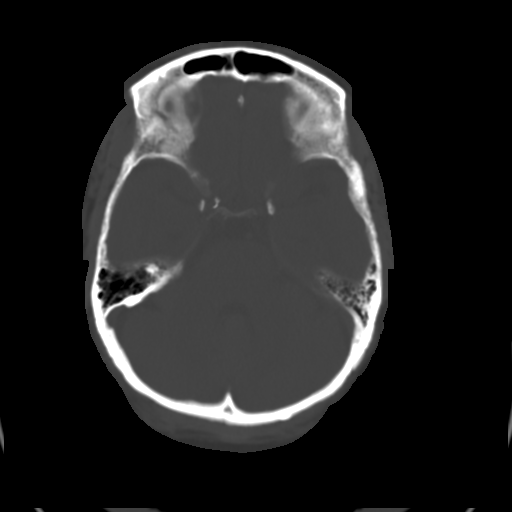
[im 11/32  brain]
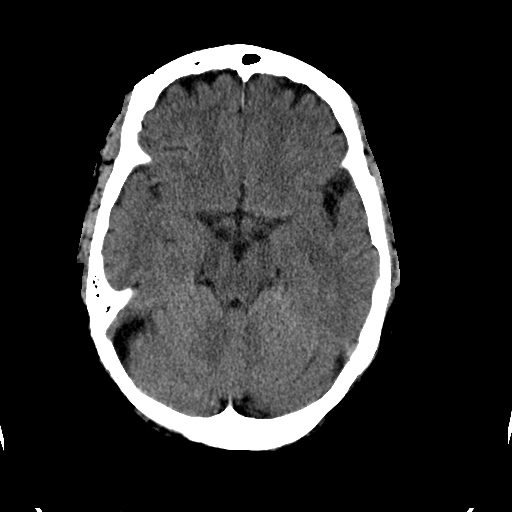
[im 13/32  brain]
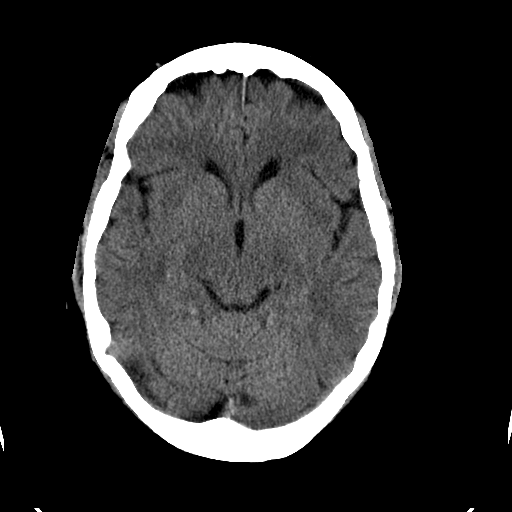
[im 15/32  brain]
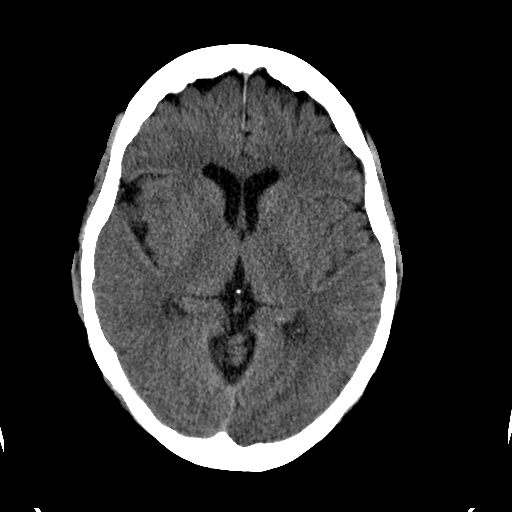
[im 17/32  brain]
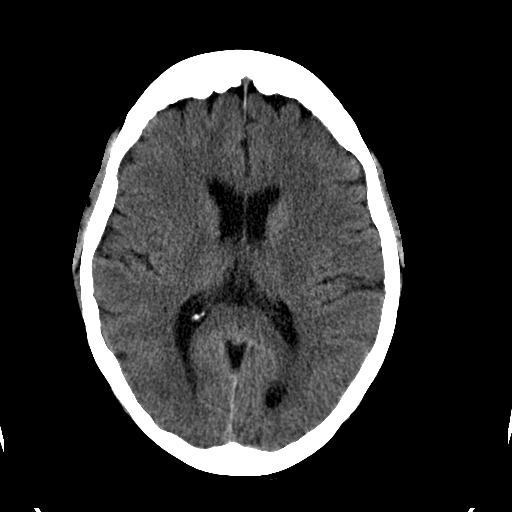
[im 17/32  bone]
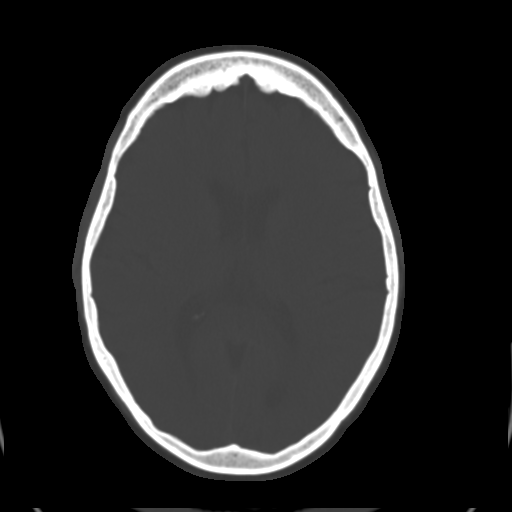
[im 19/32  brain]
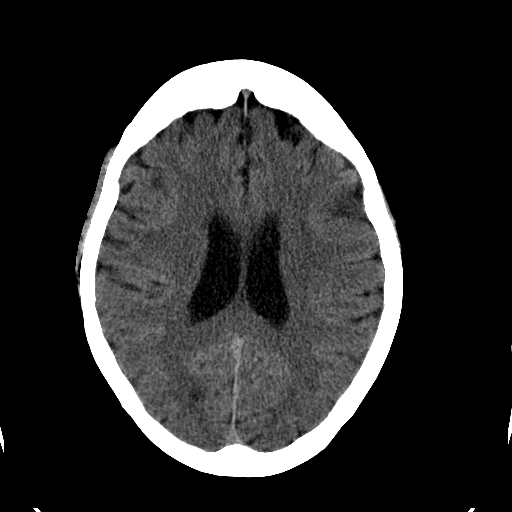
[im 21/32  brain]
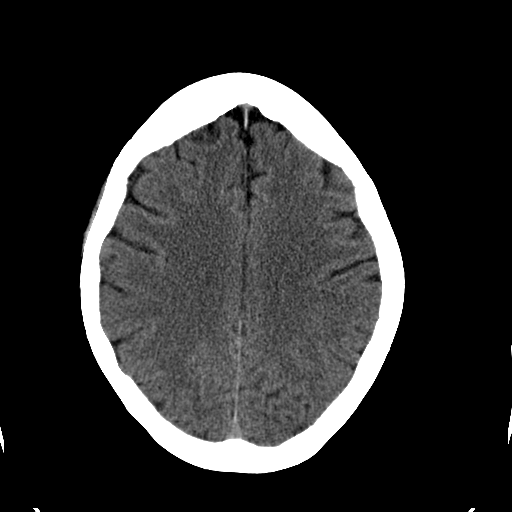
[im 23/32  brain]
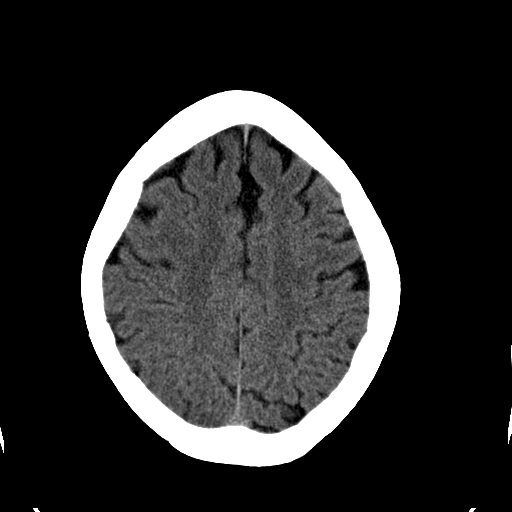
[im 24/32  brain]
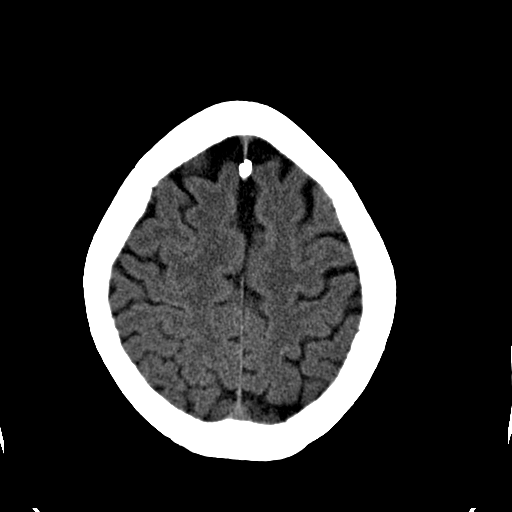
[im 24/32  bone]
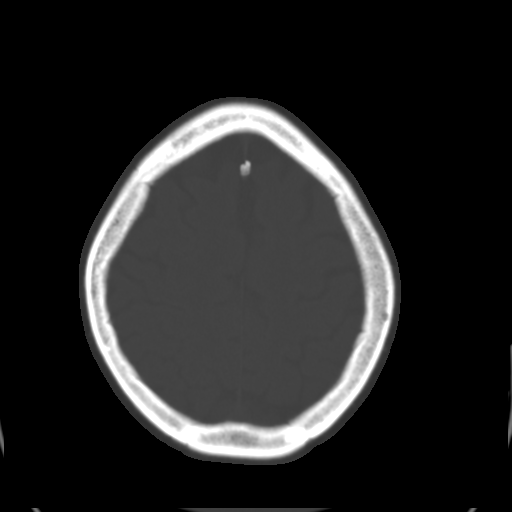
[im 26/32  brain]
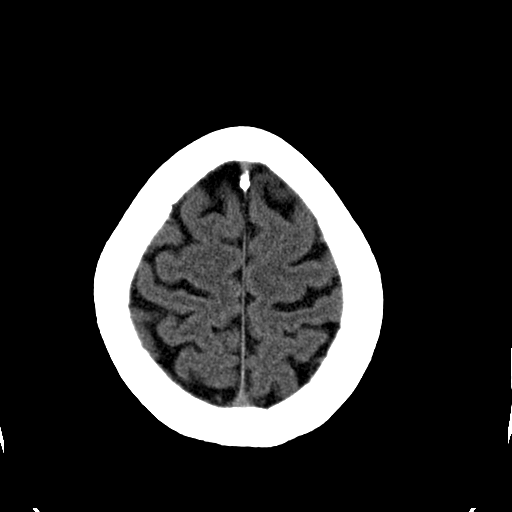
[im 28/32  brain]
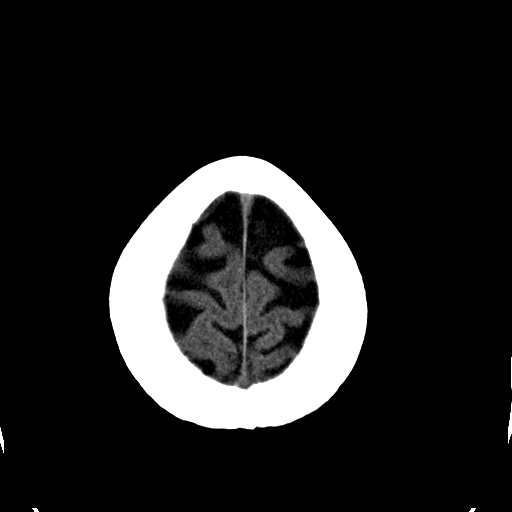
[im 30/32  brain]
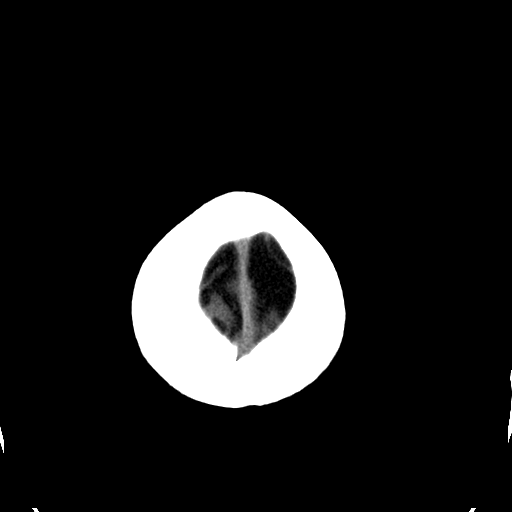

[16 of 30 positions shown; findings below may reference images not displayed]

FINDINGS: No evidence of acute infarct, hemorrhage, mass effect, or hydrocephalus.  Visualized paranasal sinuses and mastoid air cells are clear.
IMPRESSION: No acute findings.

## 2008-08-14 IMAGING — MR MR HEAD W/O CM
5 of 7 series · 31 of 48 positions shown · IV contrast (agent unspecified)
Comparison: CT head 10/02/05 and CT head 01/30/05.

CLINICAL DATA: 65-year-old female with nausea, vomiting, dizziness.  Multiple recent ER visits.
 MRI BRAIN WITHOUT CONTRAST:
TECHNIQUE: Multiplanar and multiecho pulse sequences of the brain and surrounding structures were obtained according to standard protocol without IV contrast.

[Series 2: DWI · axial · 5.0mm · 0.94mm/px · z∈[-51,+80]mm · 11 of 49 slices shown (1 of 2)]
[im 1/49]
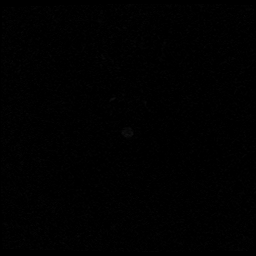
[im 5/49]
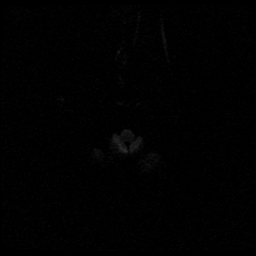
[im 10/49]
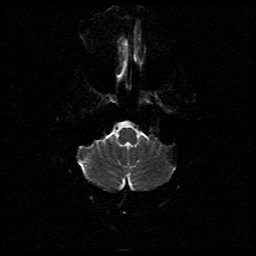
[im 15/49]
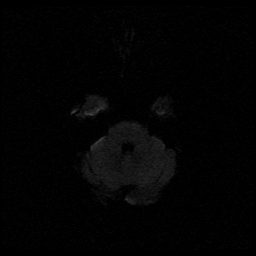
[im 20/49]
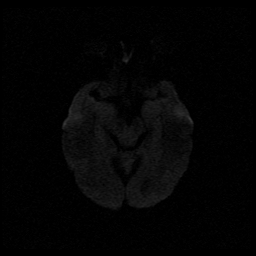
[im 25/49]
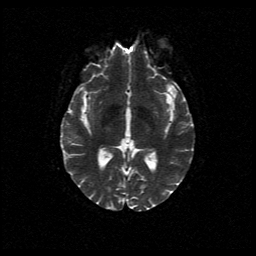
[im 29/49]
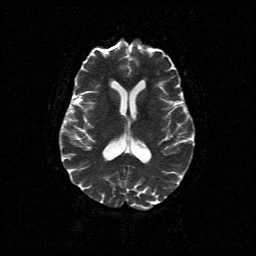
[im 34/49]
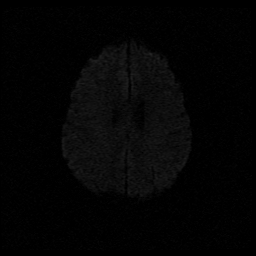
[im 39/49]
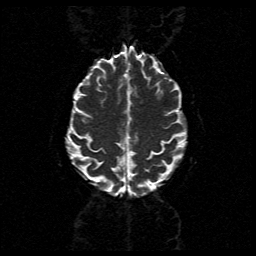
[im 44/49]
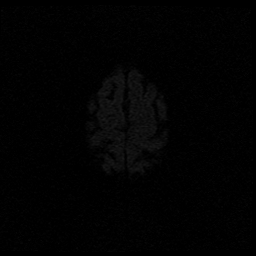
[im 49/49]
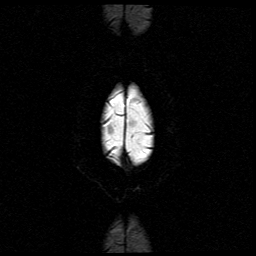

[Series 4: T2 · axial · 5.0mm · 0.47mm/px · z∈[-51,+86]mm · 5 of 24 slices shown (1 of 2)]
[im 1/24]
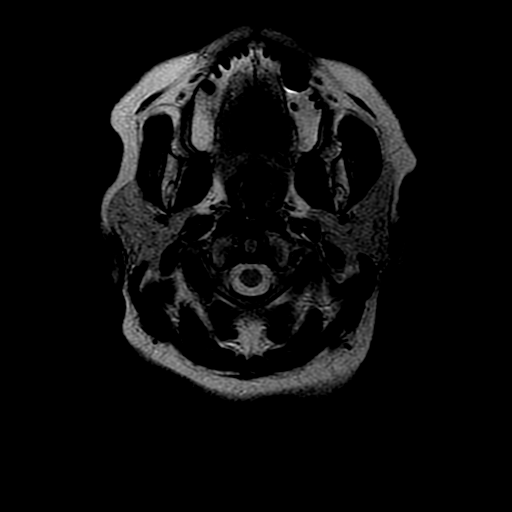
[im 6/24]
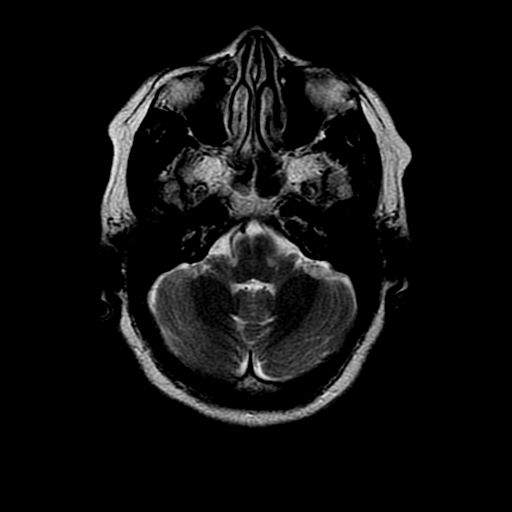
[im 12/24]
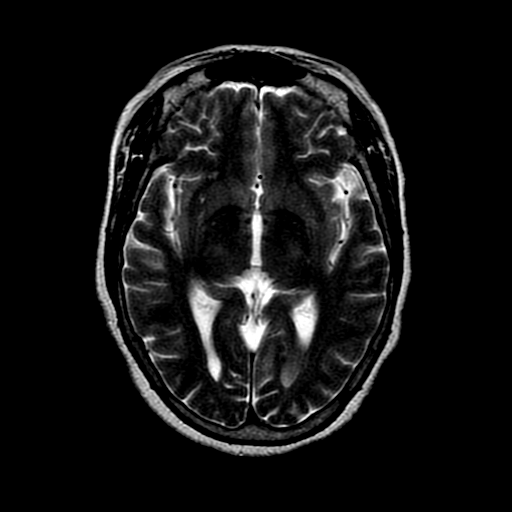
[im 18/24]
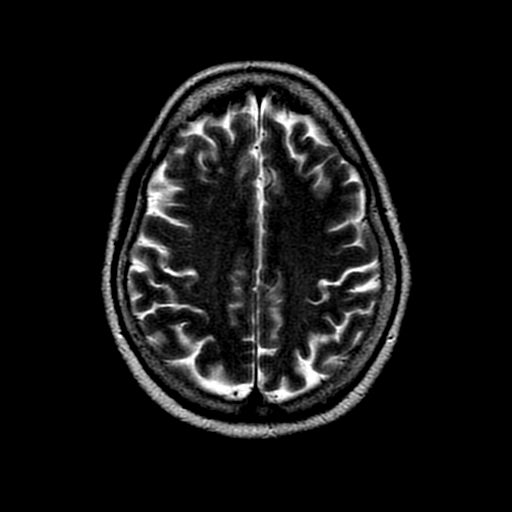
[im 24/24]
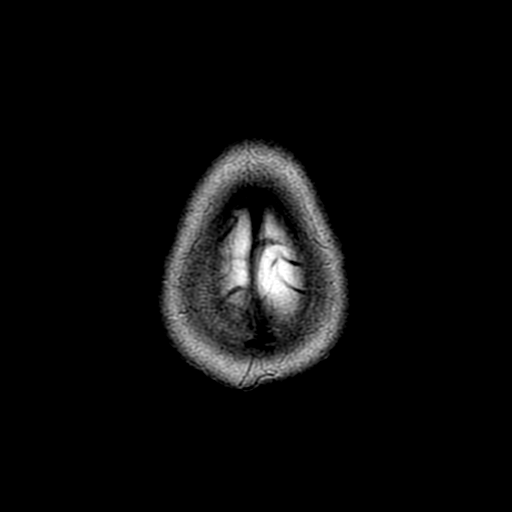

[Series 5: FLAIR · axial · 5.0mm · 0.47mm/px · z∈[-51,+86]mm · 5 of 24 slices shown]
[im 1/24]
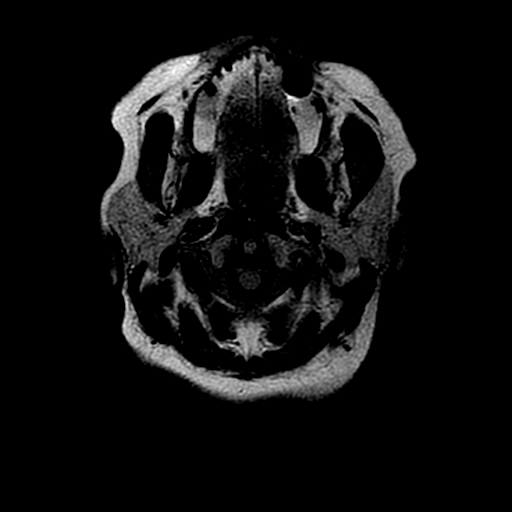
[im 6/24]
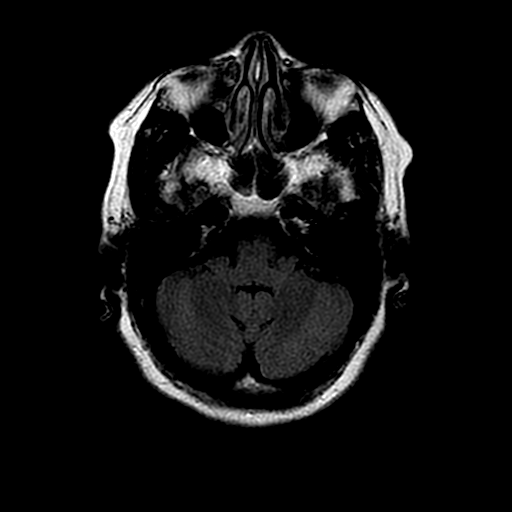
[im 12/24]
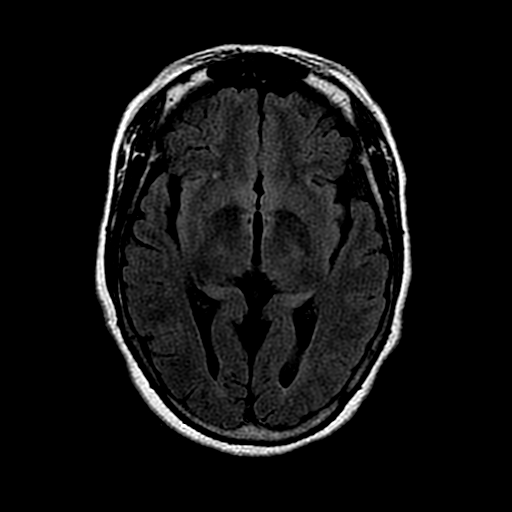
[im 18/24]
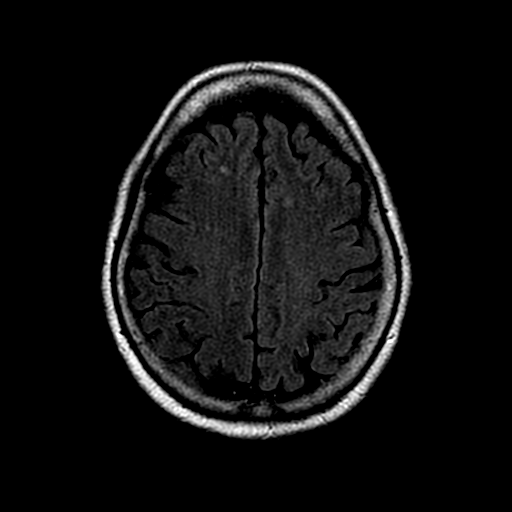
[im 24/24]
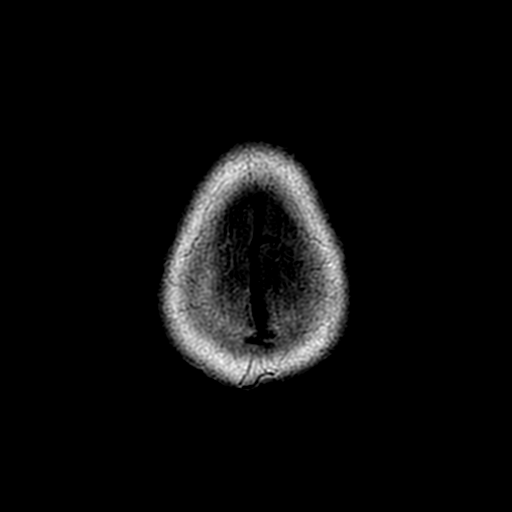

[Series 7: T2 · coronal · 5.0mm · 0.51mm/px · 4 of 24 slices shown (2 of 2)]
[im 1/24]
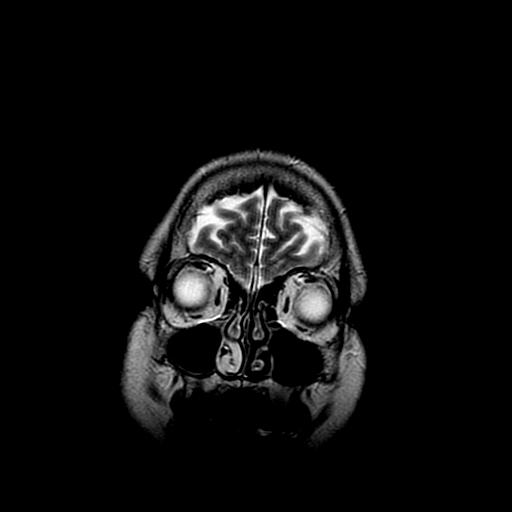
[im 6/24]
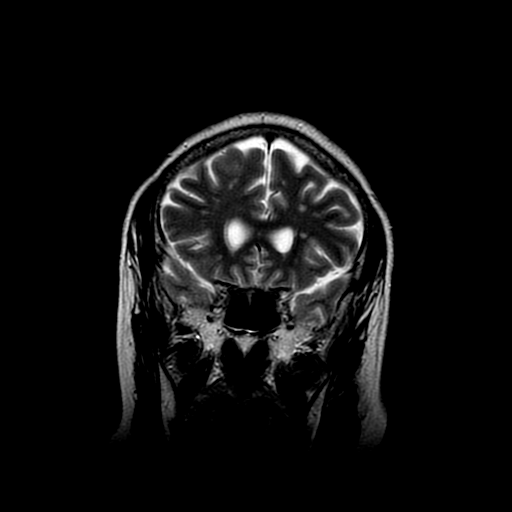
[im 12/24]
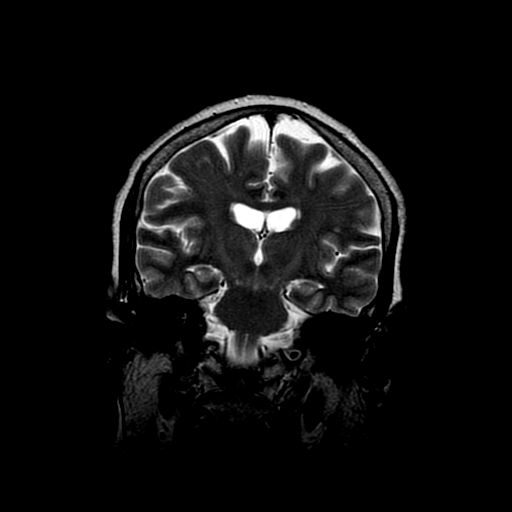
[im 18/24]
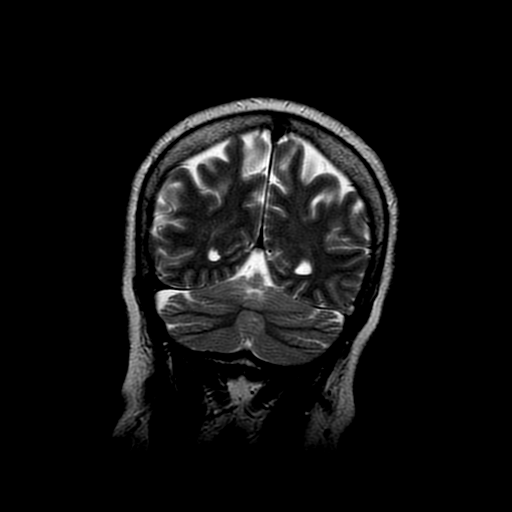

[Series 544: DWI · axial · 5.0mm · 0.94mm/px · z∈[-51,+80]mm · 6 of 25 slices shown (2 of 2)]
[im 1/25]
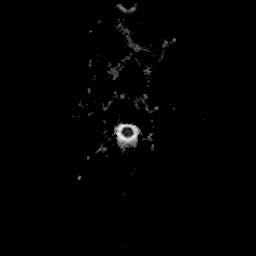
[im 5/25]
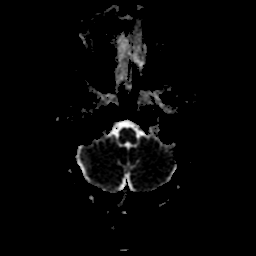
[im 10/25]
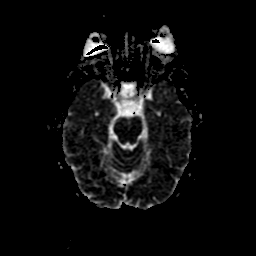
[im 15/25]
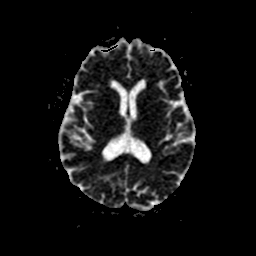
[im 20/25]
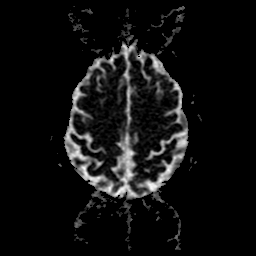
[im 25/25]
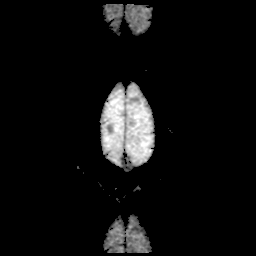

[31 of 48 positions shown; findings below may reference images not displayed]

FINDINGS: Minimal cerebellar tonsillar ectopia is evident.  The tonsils are rounded.  There is no evidence for Chiari malformation.  The space at the foramen magnum is patent.  Midline structures otherwise unremarkable.  
 Diffusion weighted images demonstrate no evidence for acute or subacute infarct.
 Scattered subcortical T2 hyperintensities are present bilaterally.  This is greater than would otherwise be expected with a patient of this age.  No significant atrophy or periventricular disease.  No evidence for hemorrhage or mass lesion.  
 Flow is present in the major intracranial arteries.  The globes and orbits are intact.  The paranasal sinuses and the mastoid air cells are clear.
IMPRESSION: 1.  No acute intracranial abnormality.  
 2.  Greater than expected subcortical T2 hyperintensities bilaterally.  These were described on previous MRI of [id number].  They remain non specific.  Differential diagnosis includes microvascular ischemia, chronic migraine headaches, demyelinating process or vasculitis or the sequelae of prior infection or inflammation.  
 3.  Mild cerebellar tonsillar ectopia.  This is likely to be incidental.
 4.  These findings were discussed with Dr. Hleki at the time of the exam 10/03/05.

## 2008-09-09 IMAGING — CR DG CHEST 2V
2 series · 2 of 2 positions shown · non-contrast
Comparison: 10/01/05.

CLINICAL DATA: Cough and runny nose.  Headache.  
 CHEST - 2 VIEW:

[view not recorded (1 of 2)]
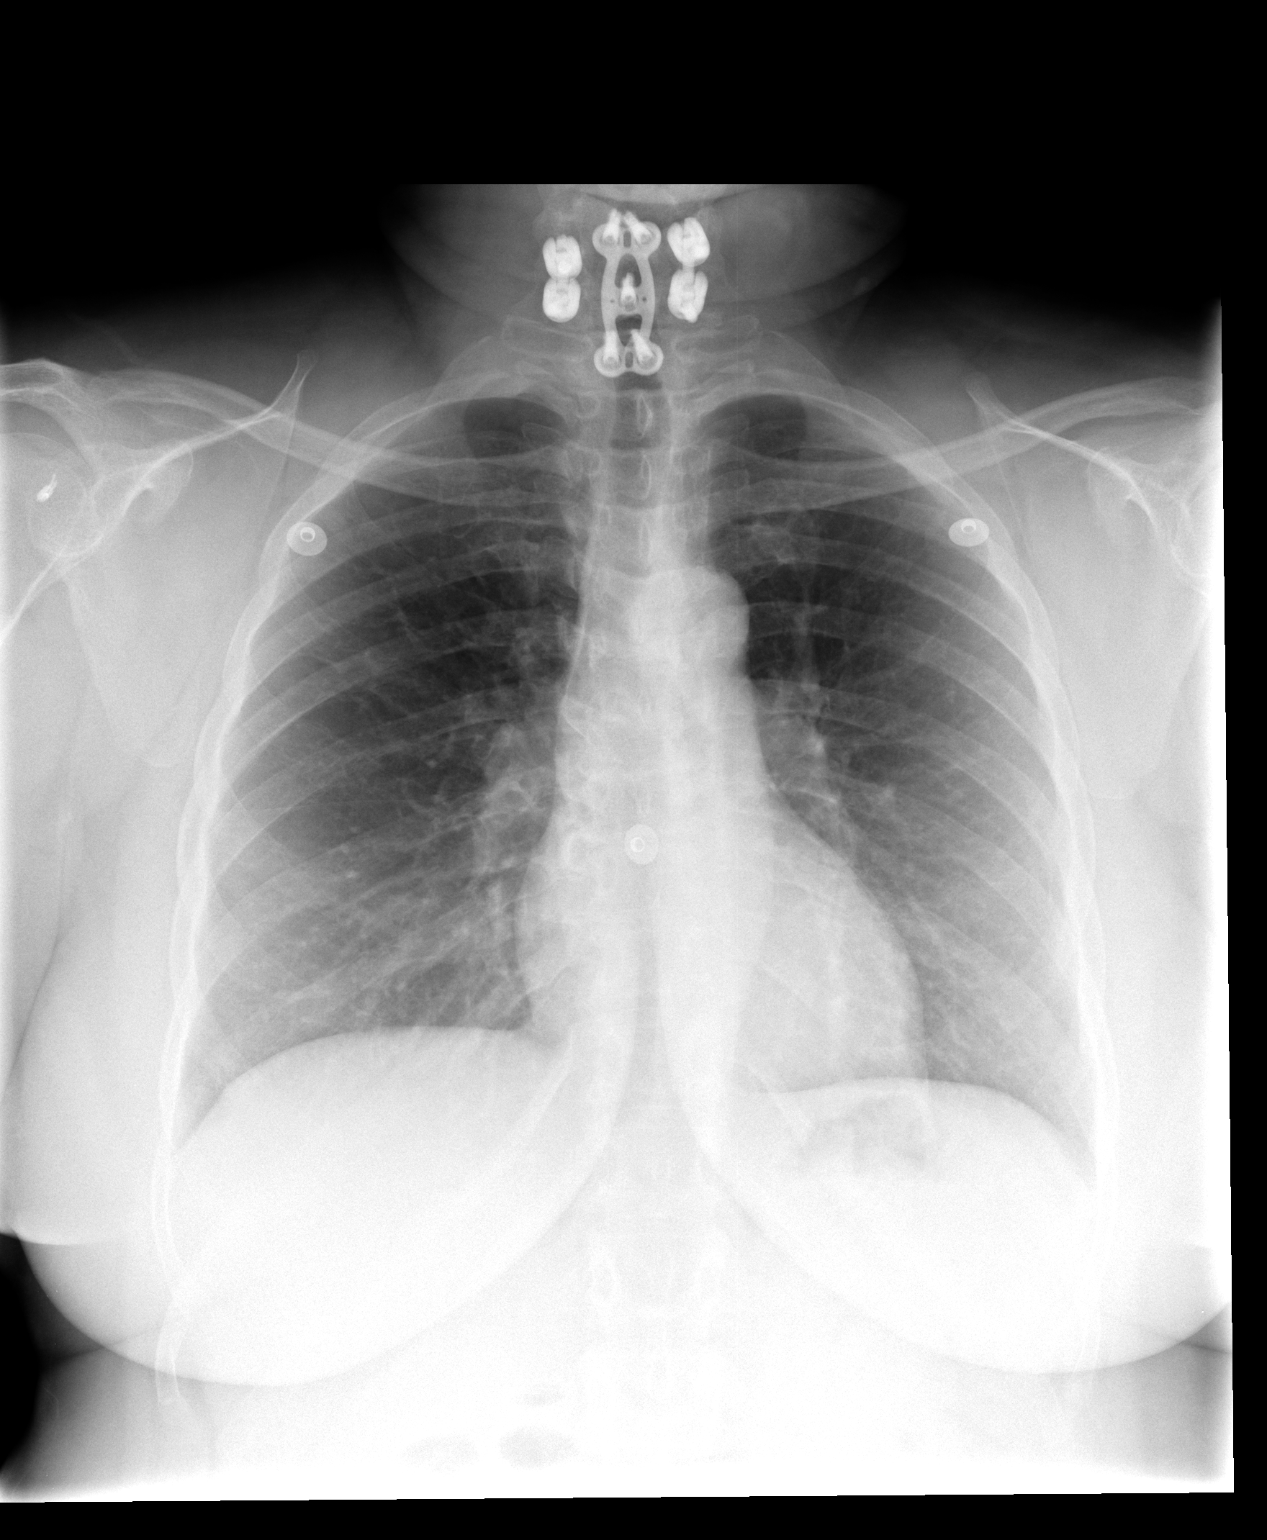

[view not recorded (2 of 2)]
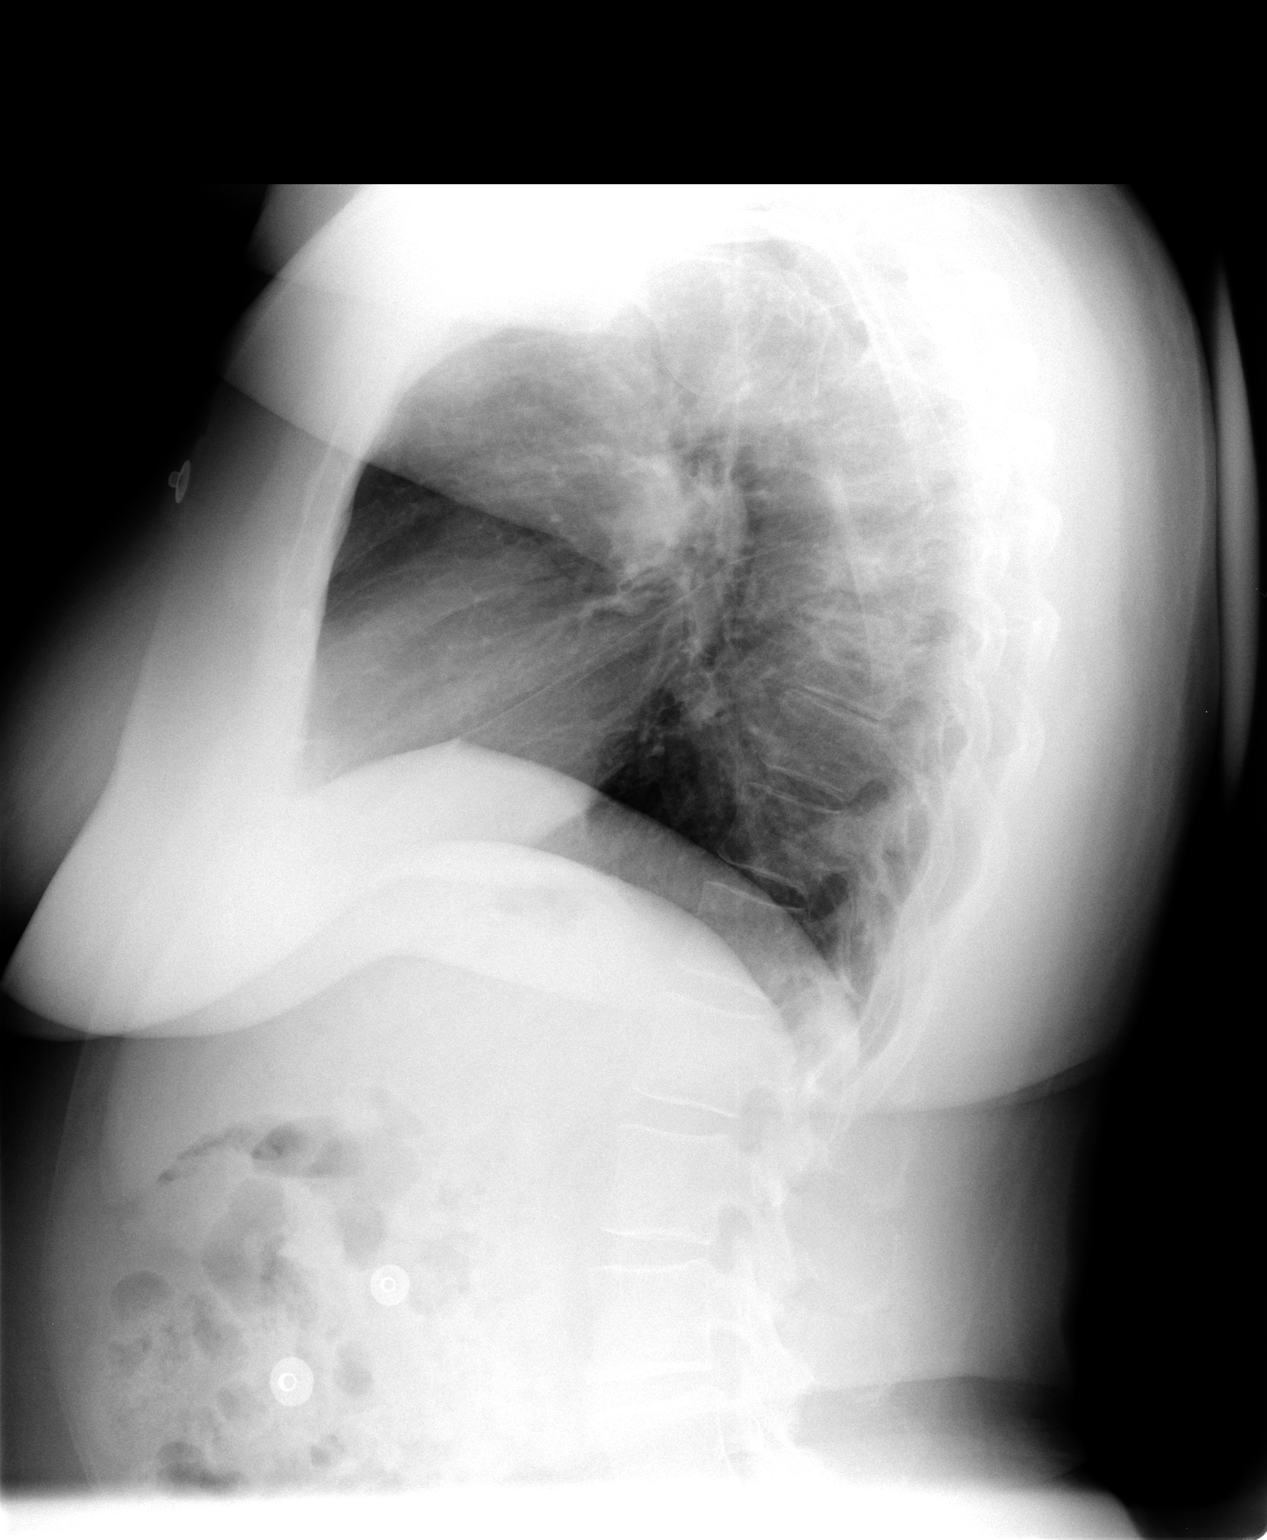

[2 of 2 positions shown; findings below may reference images not displayed]

FINDINGS: Heart size is normal. There are no effusions or edema.  No air space opacities are identified.
IMPRESSION: No active cardiopulmonary disease.

## 2008-09-29 IMAGING — MR MR CERVICAL SPINE WO/W CM
7 of 9 series · 22 of 48 positions shown · IV contrast (14 CC MAGNEVIST)
Comparison: none

CLINICAL DATA: Neck pain.  Right arm pain.  Previous cervical spine surgeries.
 MRI CERVICAL SPINE WITHOUT AND WITH CONTRAST:
TECHNIQUE: Multiplanar and multiecho pulse sequences of the cervical spine, to include the base of the skull and upper thoracic region, were obtained according to standard protocol before and after administration of intravenous contrast.
 Contrast:  14 cc Magnevist.

[Series 3: T2 · sagittal · 3.0mm · 0.69mm/px · 3 of 11 slices shown (1 of 3)]
[im 1/11]
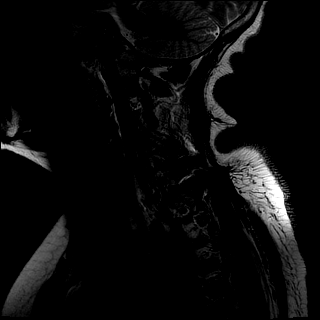
[im 6/11]
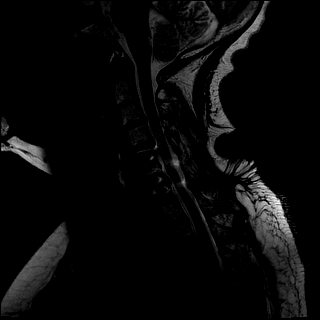
[im 11/11]
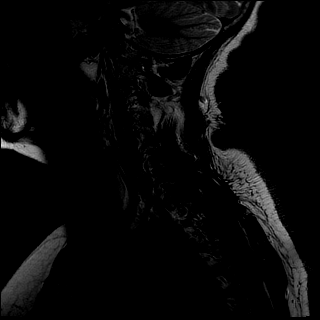

[Series 4: T1 · sagittal · 3.0mm · 0.69mm/px · 2 of 11 slices shown]
[im 1/11]
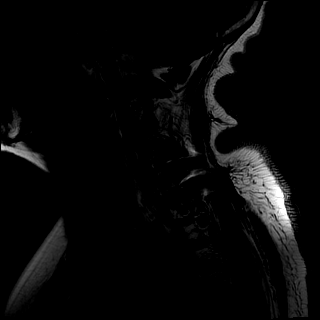
[im 11/11]
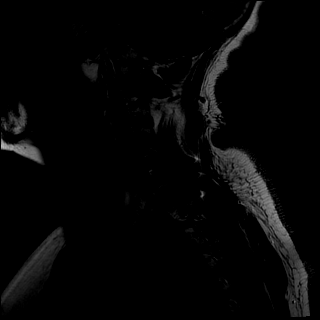

[Series 5: PD · sagittal · 3.0mm · 0.69mm/px · 2 of 11 slices shown]
[im 1/11]
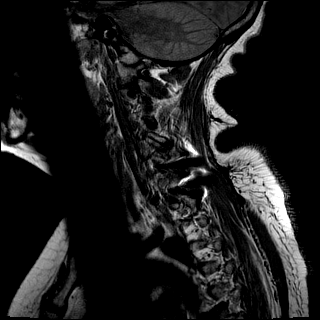
[im 11/11]
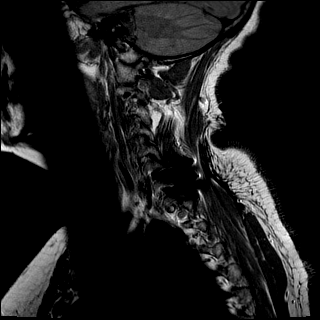

[Series 6: STIR · sagittal · 3.0mm · 0.43mm/px · 2 of 11 slices shown]
[im 1/11]
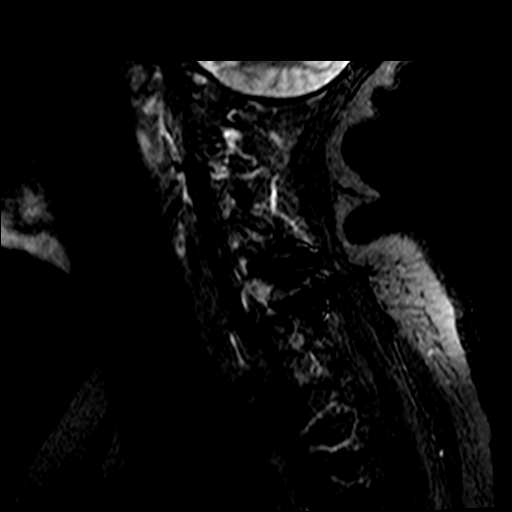
[im 11/11]
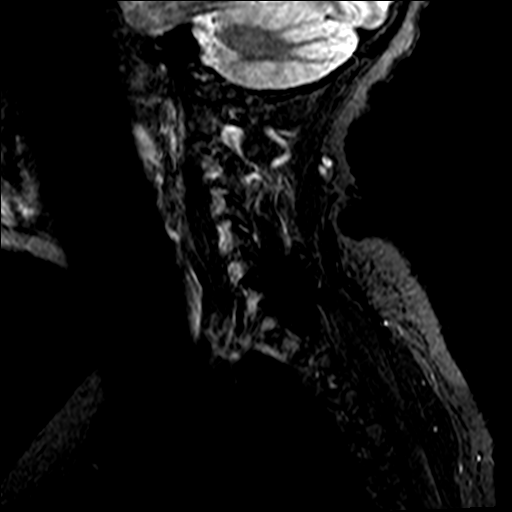

[Series 7: T2 · axial · 4.0mm · 0.41mm/px · z∈[-80,+8]mm · 5 of 21 slices shown (2 of 3)]
[im 1/21]
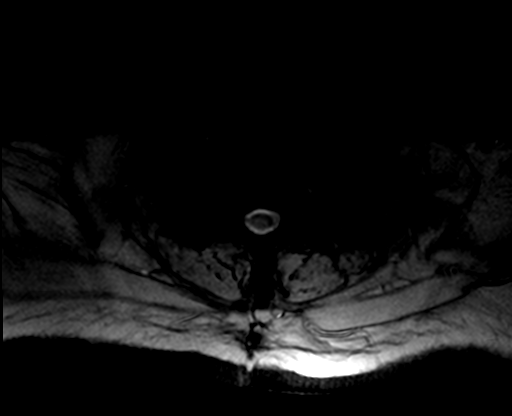
[im 6/21]
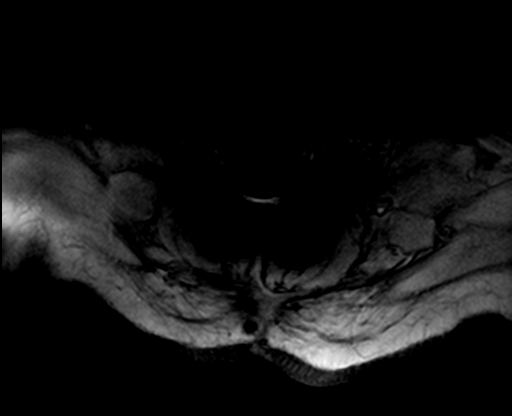
[im 11/21]
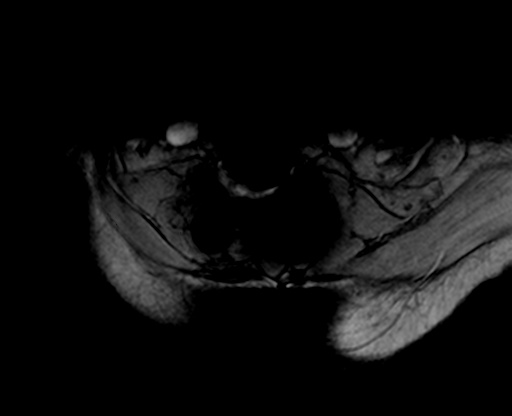
[im 16/21]
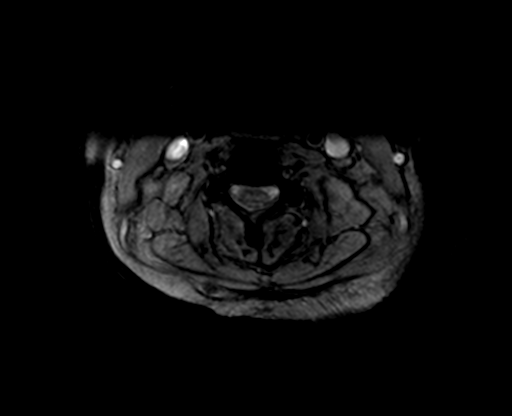
[im 21/21]
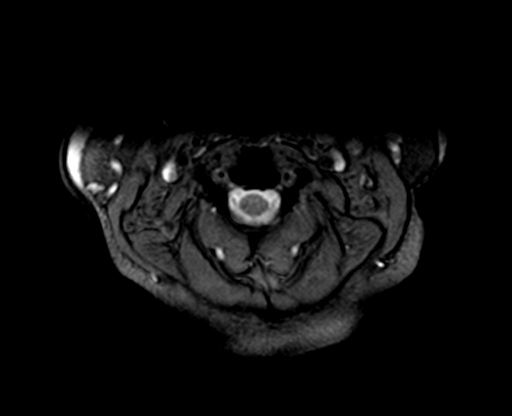

[Series 8: T2 · axial · 4.0mm · 0.41mm/px · z∈[-80,+8]mm · 5 of 21 slices shown (3 of 3)]
[im 1/21]
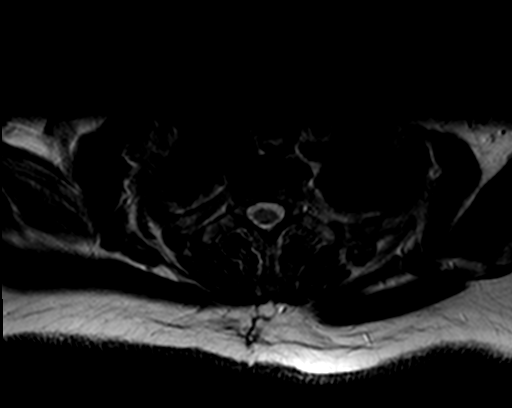
[im 6/21]
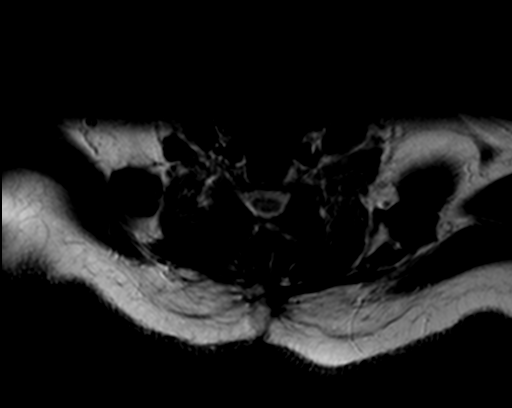
[im 11/21]
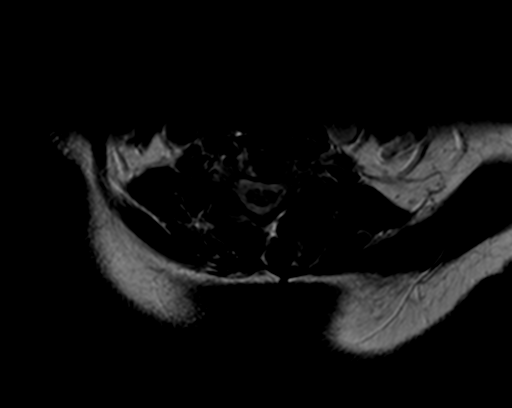
[im 16/21]
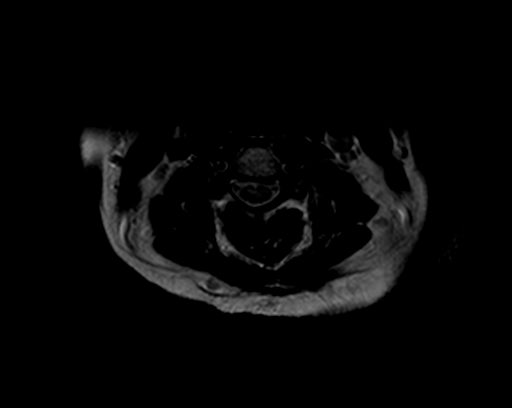
[im 21/21]
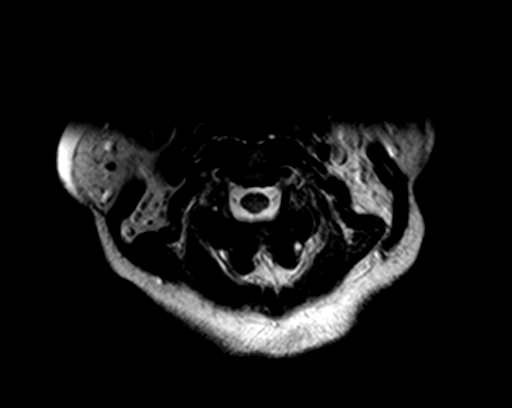

[Series 9: bSSFP · axial · 1.0mm · 0.31mm/px · z∈[-82,-60]mm · 3 of 96 slices shown]
[im 5/96]
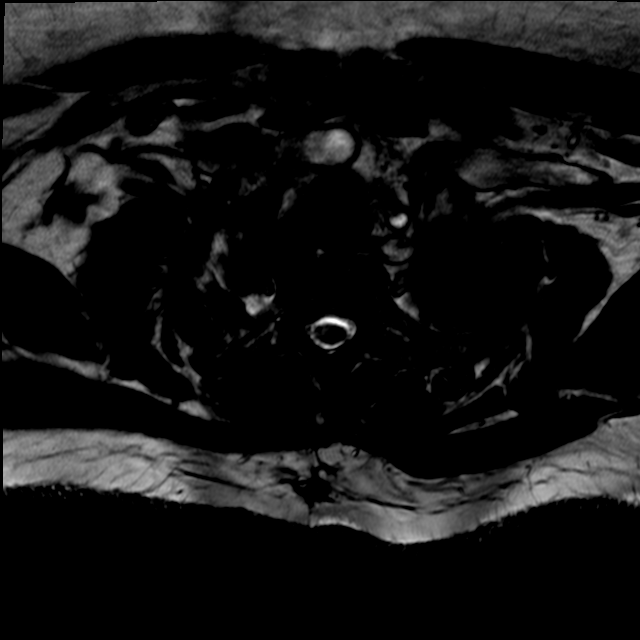
[im 19/96]
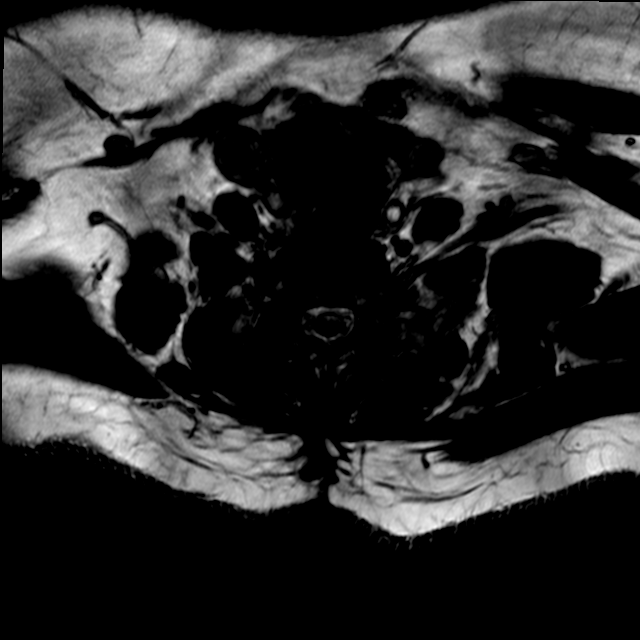
[im 28/96]
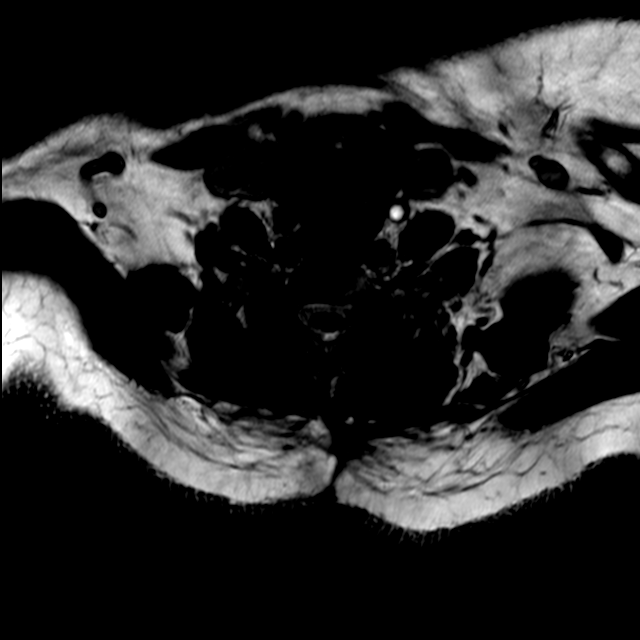

[22 of 48 positions shown; findings below may reference images not displayed]

FINDINGS: There is no abnormality at the foramen magnum or C1-2.  
 C2-3:  The disk bulges minimally, but there is no stenosis.
 C3-4:    There is spondylosis with endplate osteophytes and shallow protrusion of disk material.  The ventral subarachnoid space is narrowed, but the cord is not compressed or deformed.  There is encroachment upon the foramina bilaterally because of osteophyte and disk material.  This could be symptomatic.  
 C4-5:  There is degenerative spondylosis with endplate osteophytes and protruding disk material.  This effaces the ventral subarachnoid space and indents the ventral aspect of the cord slightly.  There is bilateral neural foraminal encroachment that could be significant.
 The patient has had anterior cervical diskectomy and fusion from C5 to C7 with posterior fusion at the C6-7.  Detail in the fusion segment is poor because of field disturbance from metal artifact, but I do not suspect any canal compromise.  The foramina cannot be accurately evaluated.  
 C7-T1:  There is some image disturbance because of field distortion.   I think there is facet arthropathy bilaterally at this level with 1 or 2 mm of anterolisthesis.  The disk bulges mildly.  The canal is widely patent.  I cannot accurately evaluate the foramina because of field distortion.  
 In the upper thoracic region, the disks show degeneration from the T1-2 through the T5-6 levels.  There are endplate osteophytes and there is shallow protrusion of disk material at all of these levels.  There does not appear to be any cord compression or deformity.  This area was not studied with axial imaging.
IMPRESSION: 1.  Degenerative spondylosis at C3-4 and C4-5 with endplate osteophytes and protrusion of disk material that efface the ventral subarachnoid space.  The changes are slightly more pronounced at C4-5.  There is neural foraminal encroachment bilaterally at these levels as well.  The findings could be significant.
 2.  Fusion from C5 to C7.   The central canal appears widely patent.  I cannot accurately evaluate the foramina because of metal artifact from the fusion hardware.
 3.  Degenerative facet disease at C7-T1 with a mm or 2 of anterolisthesis.  The central canal is widely patent.  The foramina cannot be accurately evaluated.

## 2008-10-06 ENCOUNTER — Encounter (INDEPENDENT_AMBULATORY_CARE_PROVIDER_SITE_OTHER): Payer: Self-pay | Admitting: Urology

## 2008-10-06 ENCOUNTER — Inpatient Hospital Stay (HOSPITAL_COMMUNITY): Admission: RE | Admit: 2008-10-06 | Discharge: 2008-10-09 | Payer: Self-pay | Admitting: Urology

## 2008-10-13 ENCOUNTER — Inpatient Hospital Stay (HOSPITAL_COMMUNITY): Admission: EM | Admit: 2008-10-13 | Discharge: 2008-10-14 | Payer: Self-pay | Admitting: Emergency Medicine

## 2008-10-20 IMAGING — MR MR LUMBAR SPINE W/O CM
4 of 6 series · 14 of 48 positions shown · IV contrast (agent unspecified)
Comparison: none

CLINICAL DATA: 65-year-old female with low back pain and numbness in the right leg.
 MRI LUMBAR SPINE WITHOUT CONTRAST:
TECHNIQUE: Multiplanar and multiecho pulse sequences of the lumbar spine, to include the lower thoracic and upper sacral regions, were obtained according to standard protocol without IV contrast.

[Series 3: T2 · sagittal · 4.0mm · 0.44mm/px · 5 of 11 slices shown (1 of 2)]
[im 1/11]
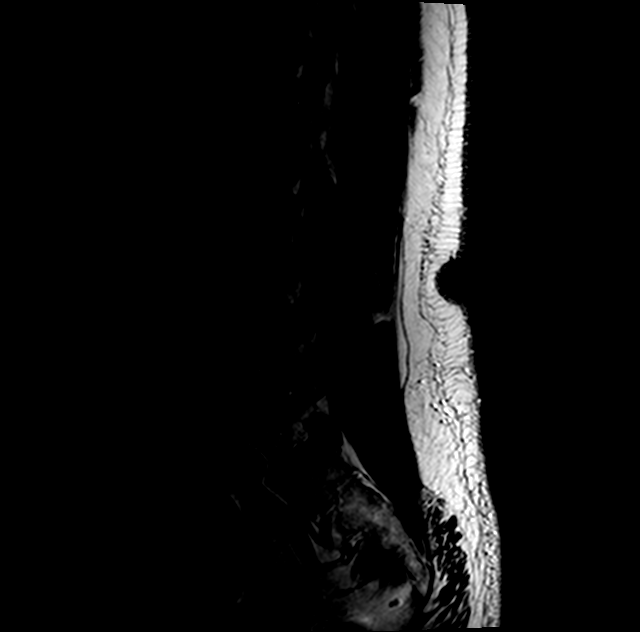
[im 2/11]
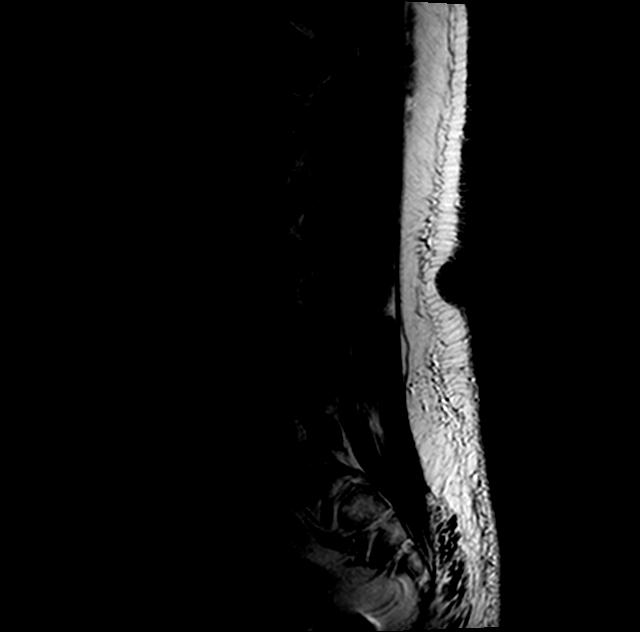
[im 4/11]
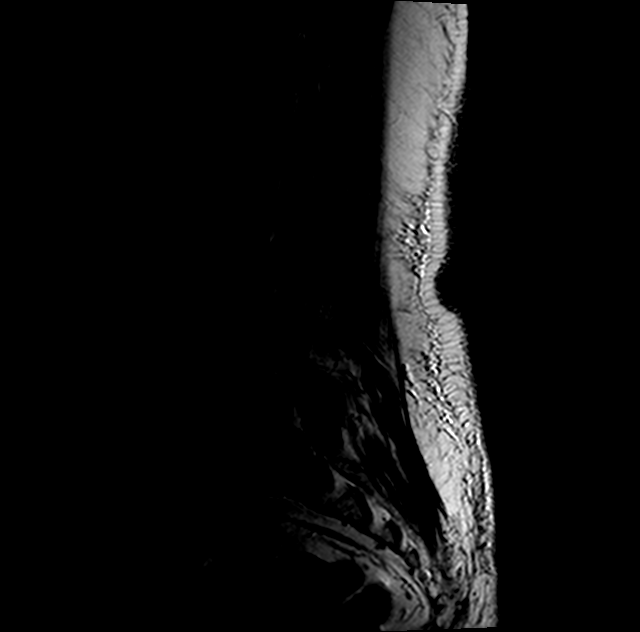
[im 6/11]
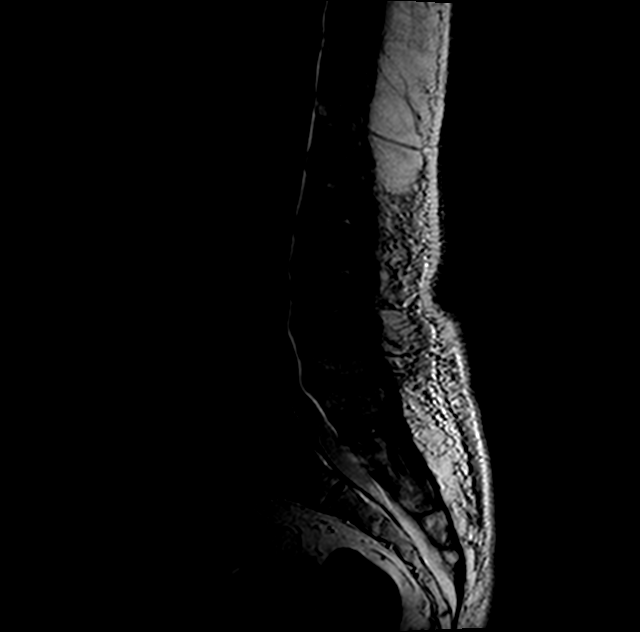
[im 9/11]
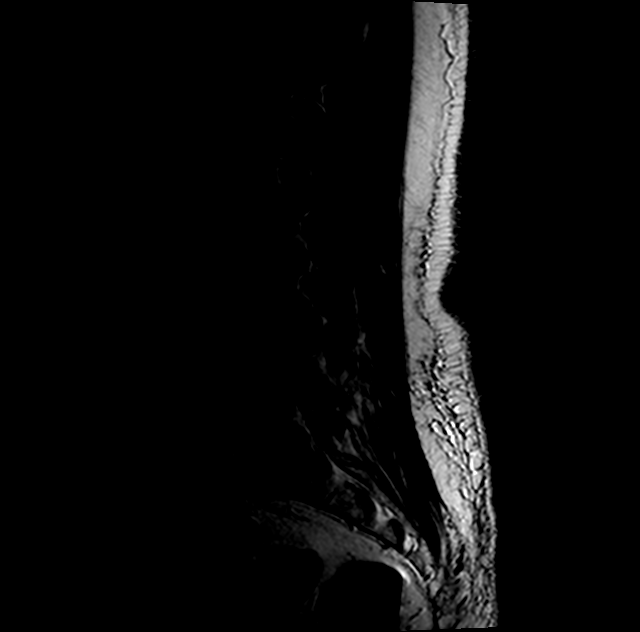

[Series 4: T1 · sagittal · 4.0mm · 0.44mm/px · 3 of 11 slices shown (1 of 2)]
[im 2/11]
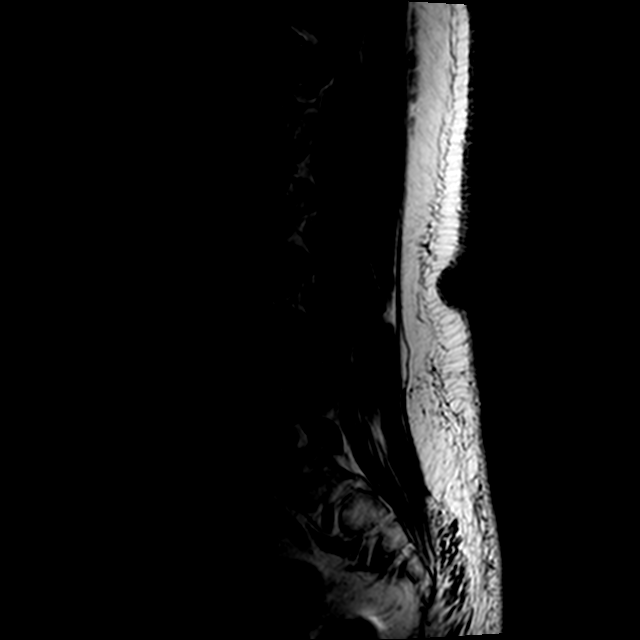
[im 6/11]
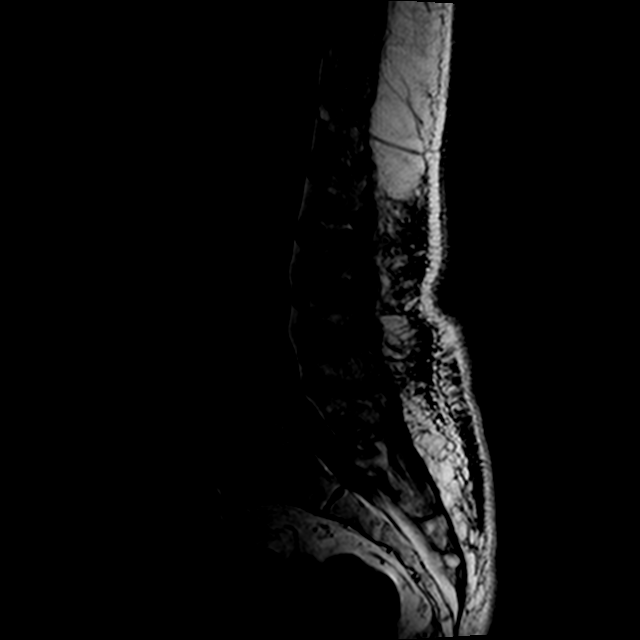
[im 9/11]
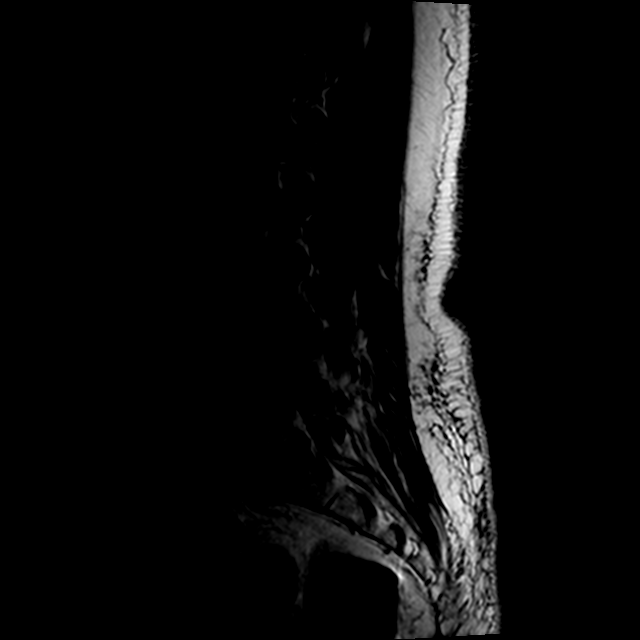

[Series 7: T1 · axial · 4.0mm · 0.43mm/px · z∈[-101,+84]mm · 3 of 20 slices shown (2 of 2)]
[im 2/20]
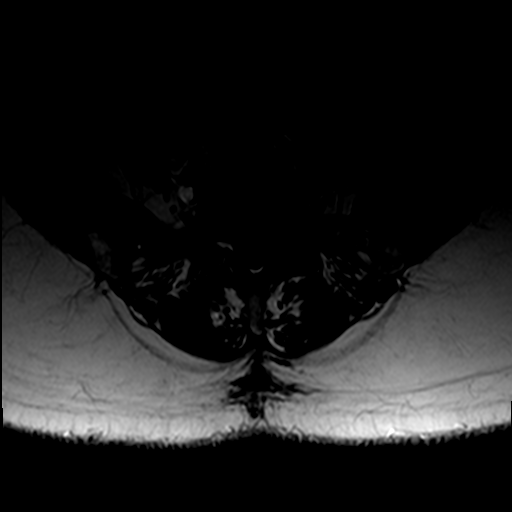
[im 10/20]
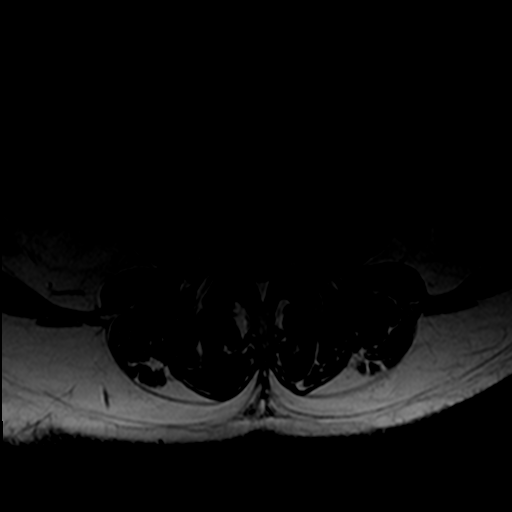
[im 18/20]
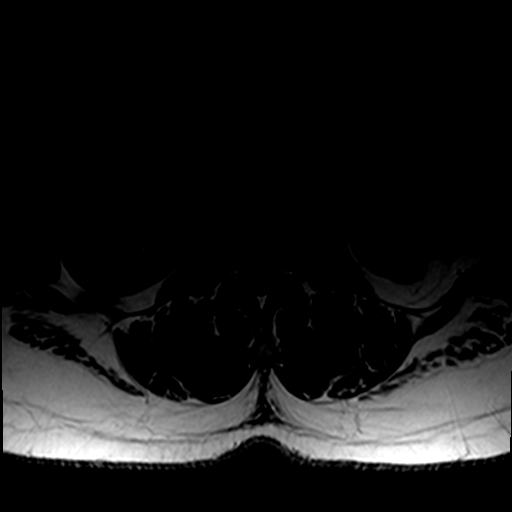

[Series 8: T2 · axial · 4.0mm · 0.34mm/px · z∈[-101,+84]mm · 3 of 20 slices shown (2 of 2)]
[im 2/20]
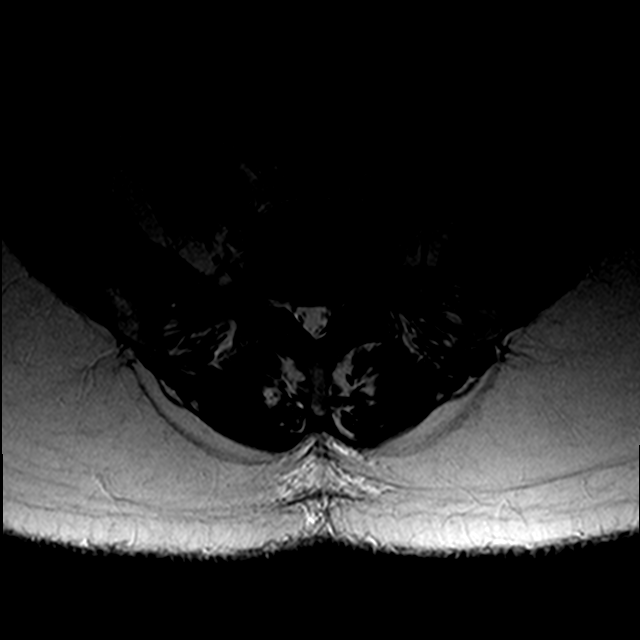
[im 10/20]
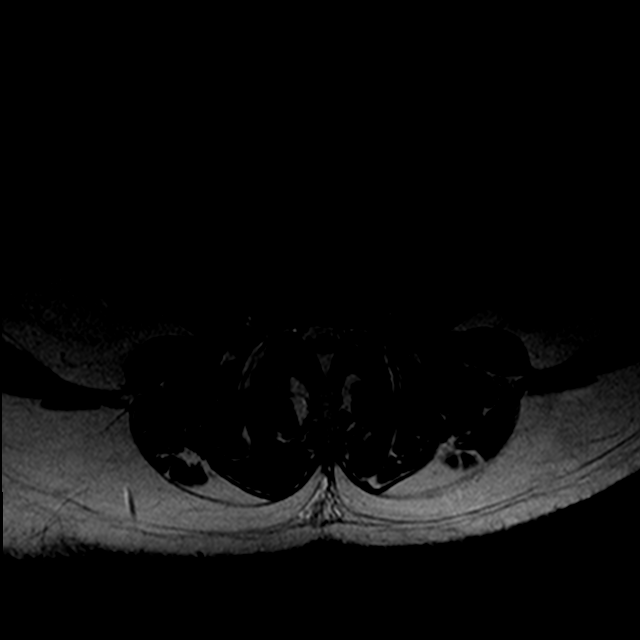
[im 18/20]
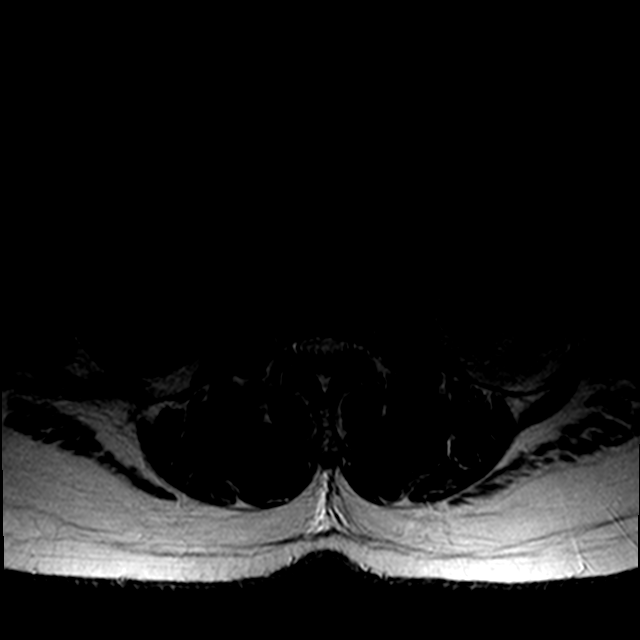

[14 of 48 positions shown; findings below may reference images not displayed]

FINDINGS: Normal signal is present in conus medullaris which terminates at L2.  Marrow signal is mildly heterogeneous without focal lesion.  Fatty endplate degenerative changes are evident at L5-S1.  A cyst is present in the left kidney.  Soft tissues are otherwise unremarkable.  
 Individual disk levels are as follows:
 L1-2:  Mild facet hypertrophy is present without significant stenosis.  
 L2-3:  Combination of facet hypertrophy and disk bulging results in some narrowing of the left lateral recess.  
 L3-4:  Moderate facet hypertrophy and ligamentum flavum thickening is present.  There is a mild disk bulge.  This results in some narrowing of the lateral recess bilaterally and disk bulging into the inferior recess of both neural foramina.  
 L4-5:  Mild disk bulge in conjunction with bilateral facet hypertrophy and ligamentum flavum thickening results in mild to moderate central canal stenosis.  There is particular impact upon the lateral recess bilaterally.  Mild biforaminal narrowing is worse left than right.  
 L5-S1:  As stated, there are chronic endplate degenerative changes.  Disk bulge is slightly eccentric to the left.  Additionally, there is asymmetric facet hypertrophy and ligamentum flavum thickening.  This results in narrowing of the left greater than right lateral recess, potentially affecting the S1 nerve roots.  Neural foramina are patent.
IMPRESSION: 1.  Multilevel facet degenerative changes and mild disk bulging results in narrowing of the left lateral recess at L2-3, bilateral lateral recess at L3-4, and moderate central canal stenosis at L4-5.  There is mild left lateral recess narrowing at L5-S1.

## 2009-01-19 IMAGING — CR DG CHEST 2V
2 series · 2 of 2 positions shown · non-contrast
Comparison: None

CLINICAL DATA: Chest pain, nausea and diarrhea. 
 CHEST - 2 VIEW:

[view not recorded (1 of 2)]
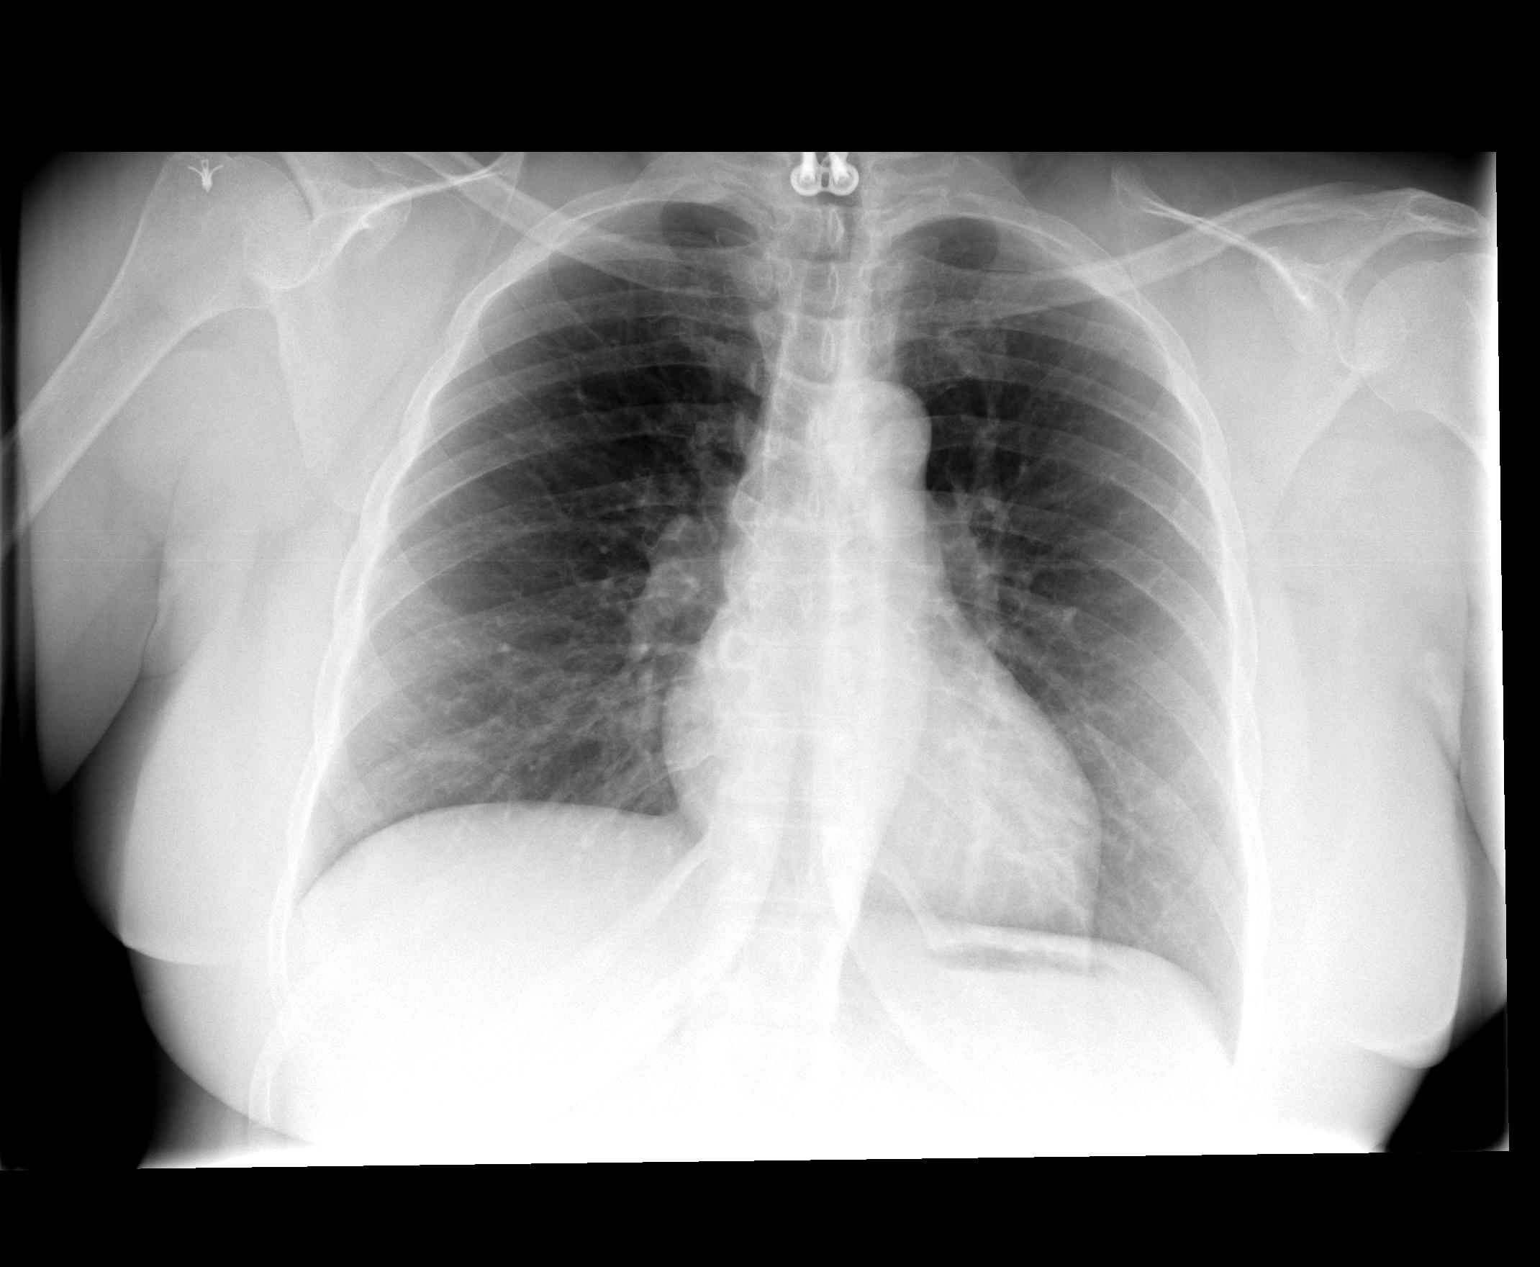

[view not recorded (2 of 2)]
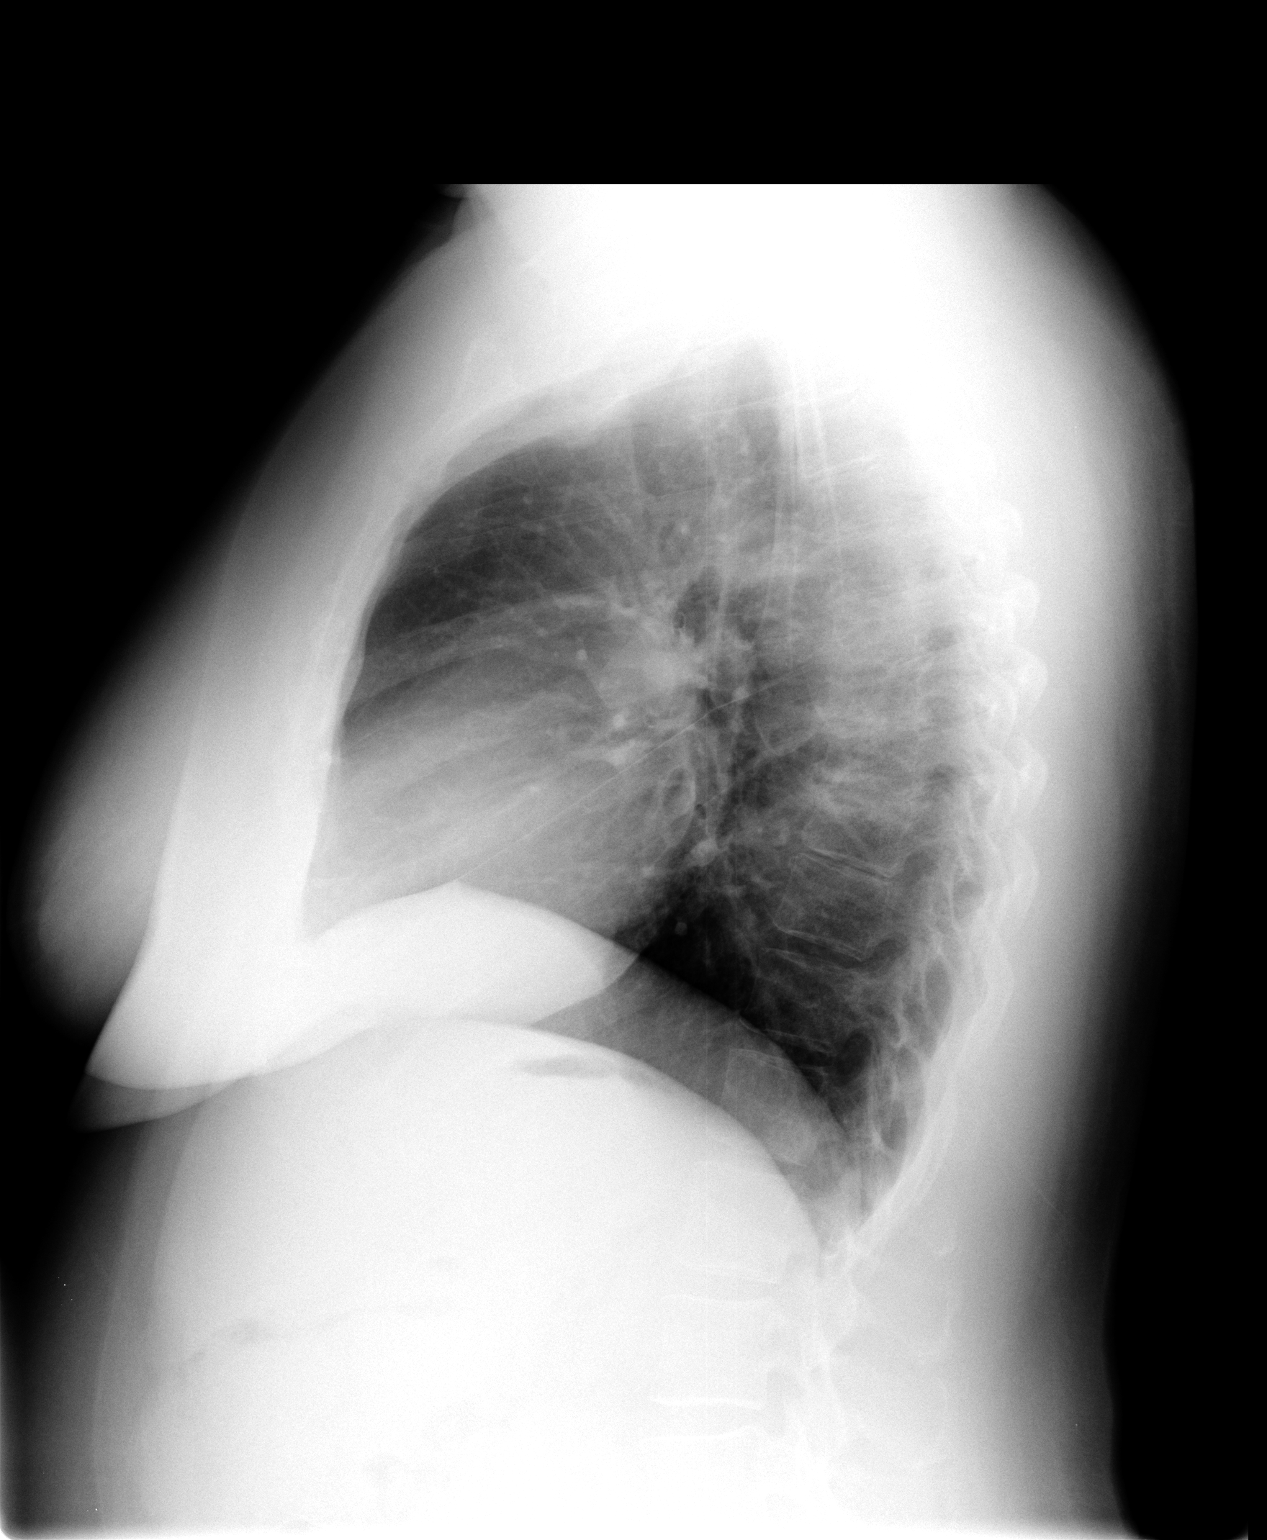

[2 of 2 positions shown; findings below may reference images not displayed]

FINDINGS: Trachea is midline.  Heart size normal.  Lungs are clear.  No pleural fluid.  Degenerative changes are seen in the mid-thoracic spine.
IMPRESSION: No acute findings.

## 2009-01-19 IMAGING — CT CT ANGIO CHEST
1 of 2 series · 20 of 30 positions shown · IV contrast (APPLIED)
Comparison: none

CLINICAL DATA: Chest pain

[Series 5: pe 1.0 b20f st · axial · 0.70mm/px · z∈[-16,+213]mm · 20 of 255 slices shown]
[im 13/255  lung]
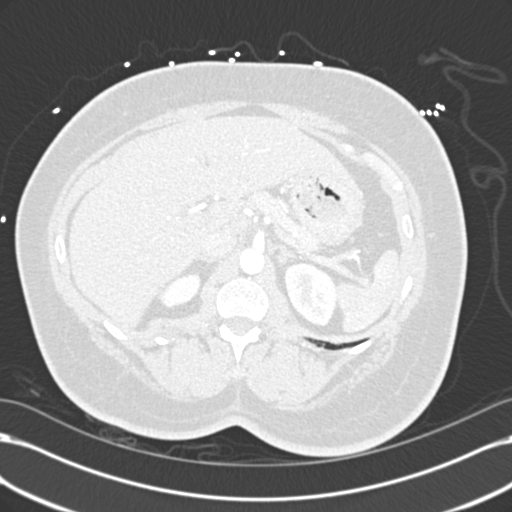
[im 26/255  mediastinal]
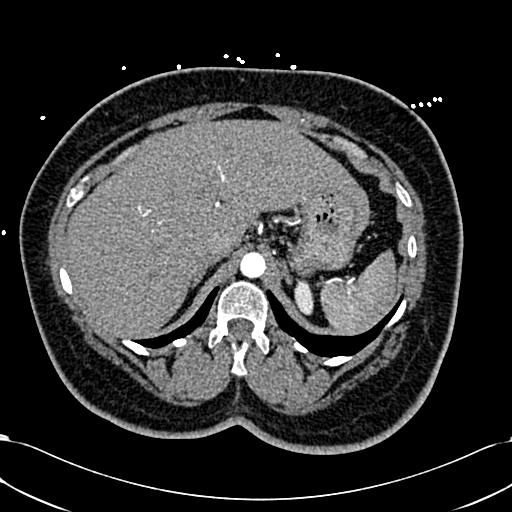
[im 39/255  lung]
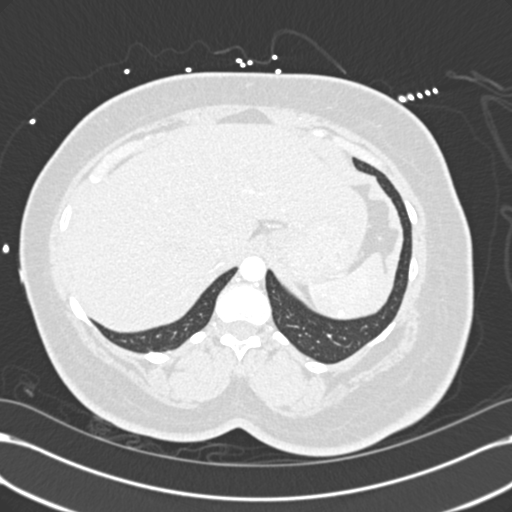
[im 51/255  mediastinal]
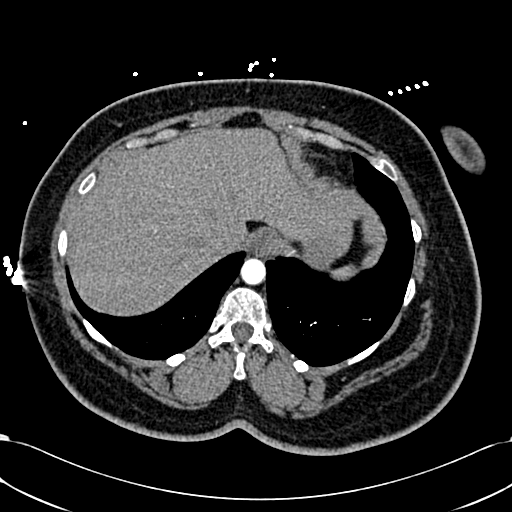
[im 64/255  lung]
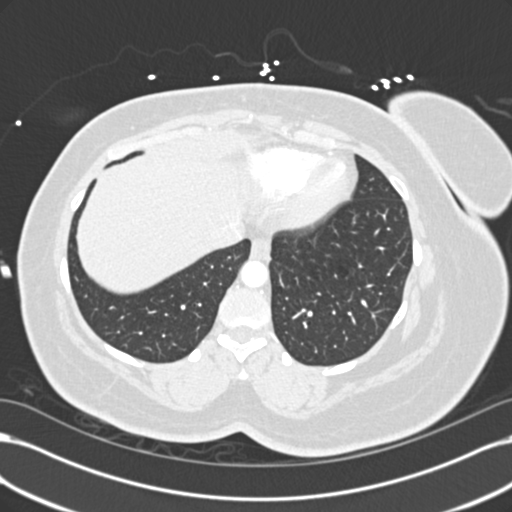
[im 77/255  mediastinal]
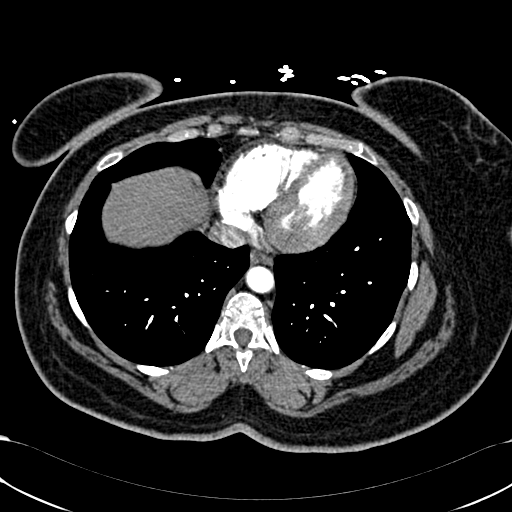
[im 89/255  lung]
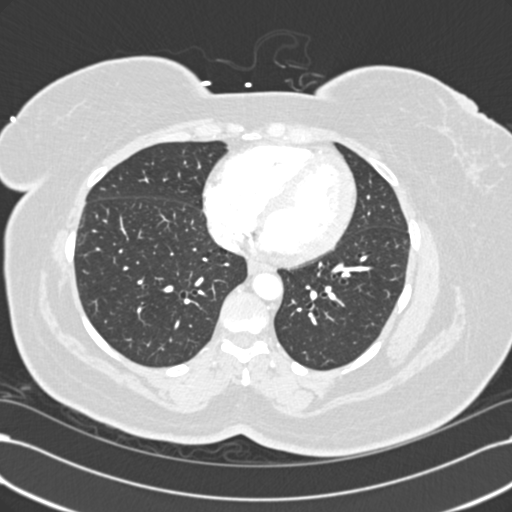
[im 102/255  mediastinal]
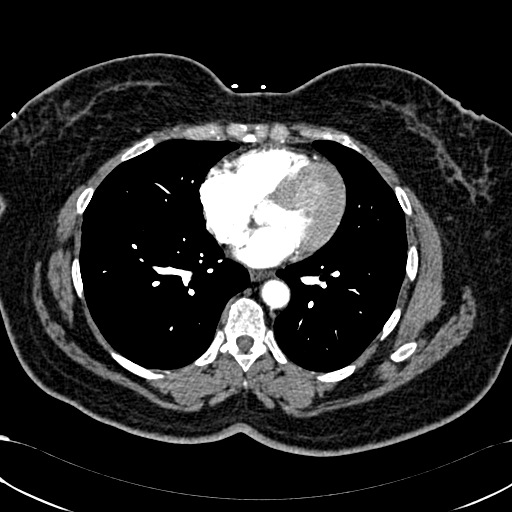
[im 115/255  lung]
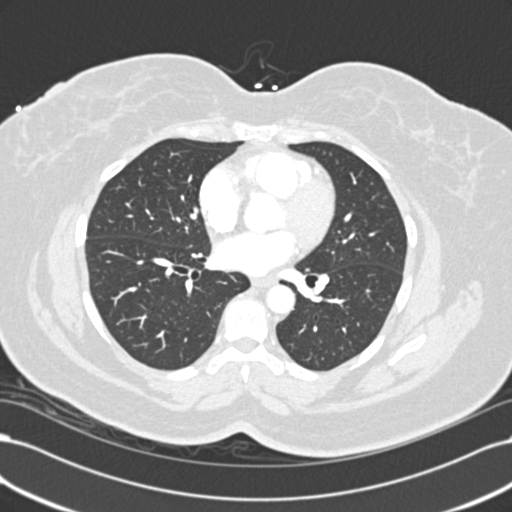
[im 118/255  mediastinal]
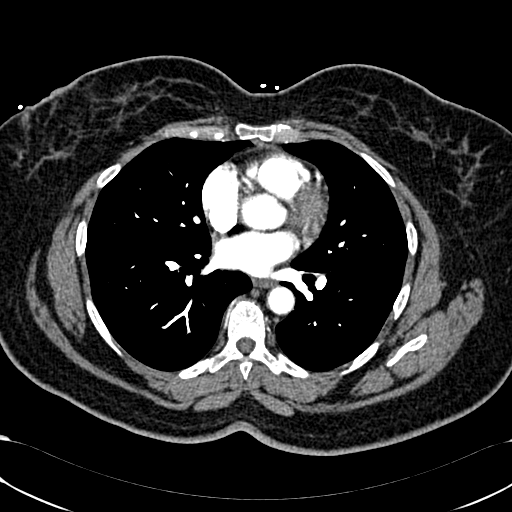
[im 128/255  lung]
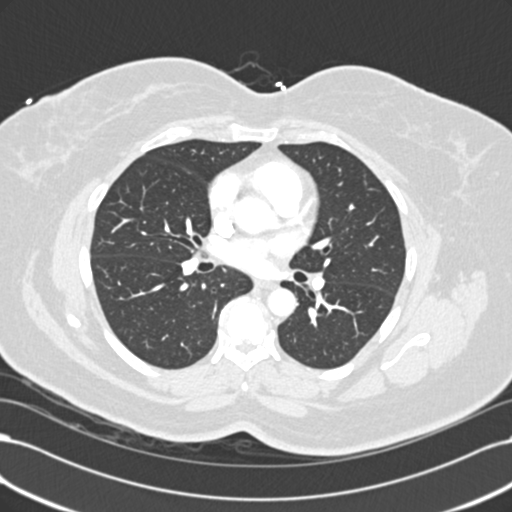
[im 140/255  mediastinal]
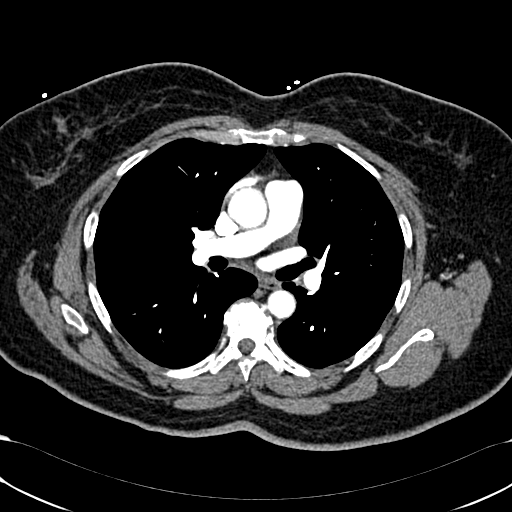
[im 153/255  lung]
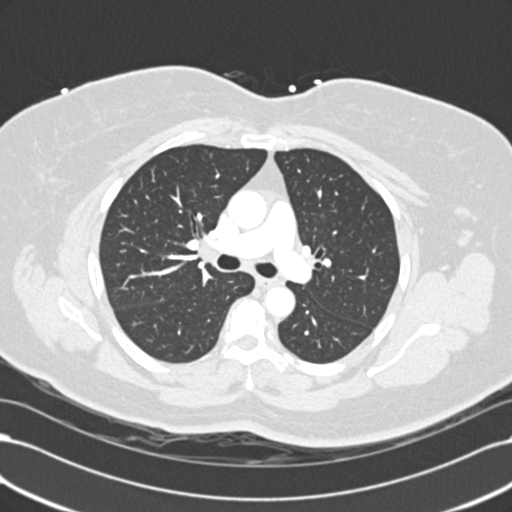
[im 166/255  mediastinal]
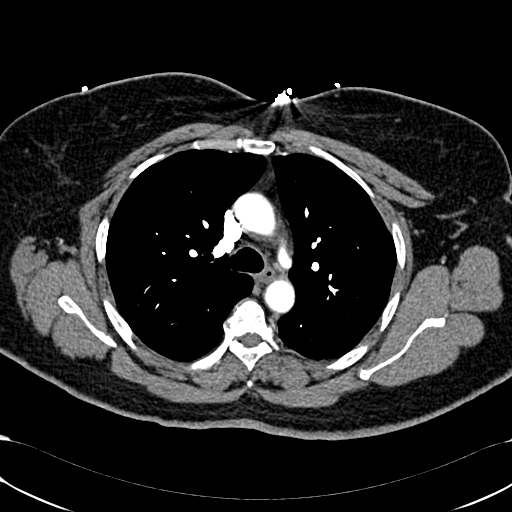
[im 178/255  lung]
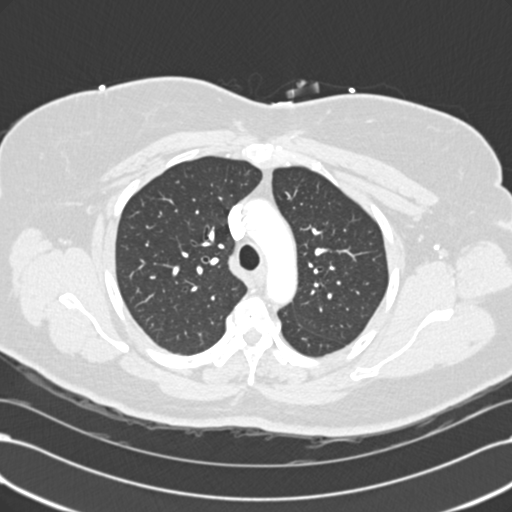
[im 191/255  mediastinal]
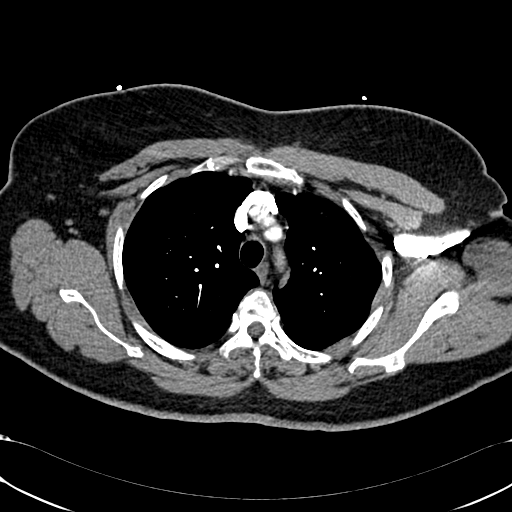
[im 204/255  lung]
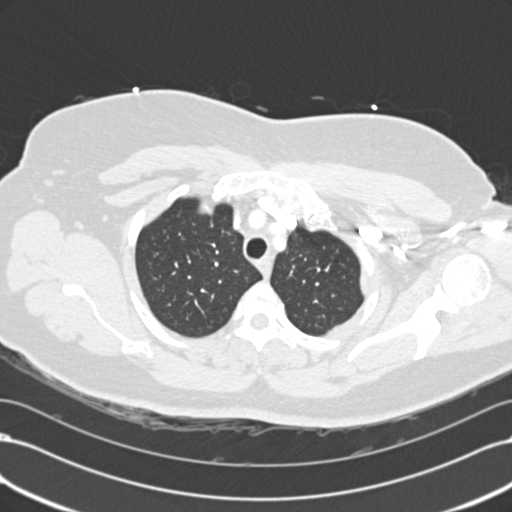
[im 216/255  mediastinal]
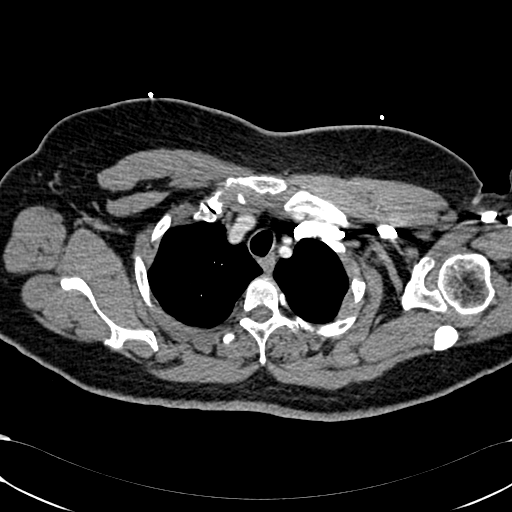
[im 229/255  lung]
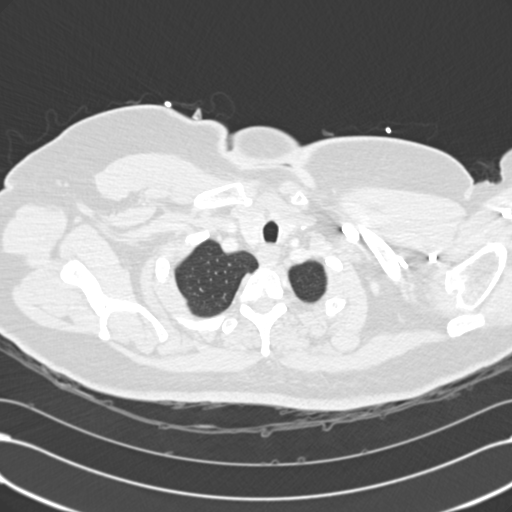
[im 242/255  mediastinal]
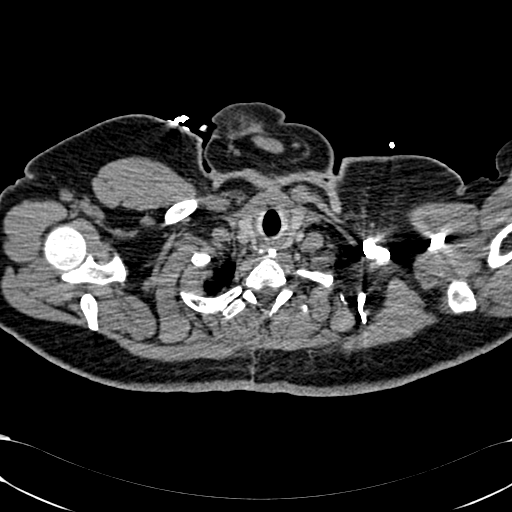

[20 of 30 positions shown; findings below may reference images not displayed]

CT angiogram chest with contrast:

Multidetector helical CT of the chest was obtained after 80 ml Omnipaque 300 
IV. CT multiplanar reconstructions were rendered to evaluate the vascular
anatomy.
No previous for comparison. Good contrast opacification of pulmonary artery
branches. No discrete filling defects to suggest acute PE. Scattered
atheromatous calcifications in the aortic arch. No pleural or pericardial
effusion. No hilar or mediastinal adenopathy. Cervical plate partially seen.
Lungs clear. Visualized portions of upper abdomen unremarkable. Spondylitic
changes in the midthoracic spine. Coronal and sagittal reconstructions confirm
the above findings.
IMPRESSION: 1. Negative for acute PE or thoracic aortic dissection.

## 2009-02-08 ENCOUNTER — Encounter: Admission: RE | Admit: 2009-02-08 | Discharge: 2009-02-08 | Payer: Self-pay | Admitting: Family Medicine

## 2009-02-26 ENCOUNTER — Observation Stay (HOSPITAL_COMMUNITY): Admission: EM | Admit: 2009-02-26 | Discharge: 2009-02-26 | Payer: Self-pay | Admitting: Emergency Medicine

## 2009-05-02 ENCOUNTER — Emergency Department (HOSPITAL_COMMUNITY): Admission: EM | Admit: 2009-05-02 | Discharge: 2009-05-02 | Payer: Self-pay | Admitting: Emergency Medicine

## 2009-07-05 ENCOUNTER — Emergency Department (HOSPITAL_COMMUNITY): Admission: EM | Admit: 2009-07-05 | Discharge: 2009-07-05 | Payer: Self-pay | Admitting: Emergency Medicine

## 2009-07-28 ENCOUNTER — Emergency Department (HOSPITAL_COMMUNITY): Admission: EM | Admit: 2009-07-28 | Discharge: 2009-07-28 | Payer: Self-pay | Admitting: Emergency Medicine

## 2009-08-13 IMAGING — CR DG LUMBAR SPINE COMPLETE 4+V
5 series · 5 of 5 positions shown · non-contrast
Comparison: GI lumbar spine MRI 12/09/05.

CLINICAL DATA: Low back, right hip pain radiating intermittently to right leg.  No specific injury. 
 DIAGNOSTIC LUMBAR SPINE ? 4 VIEW:

[view not recorded (1 of 5)]
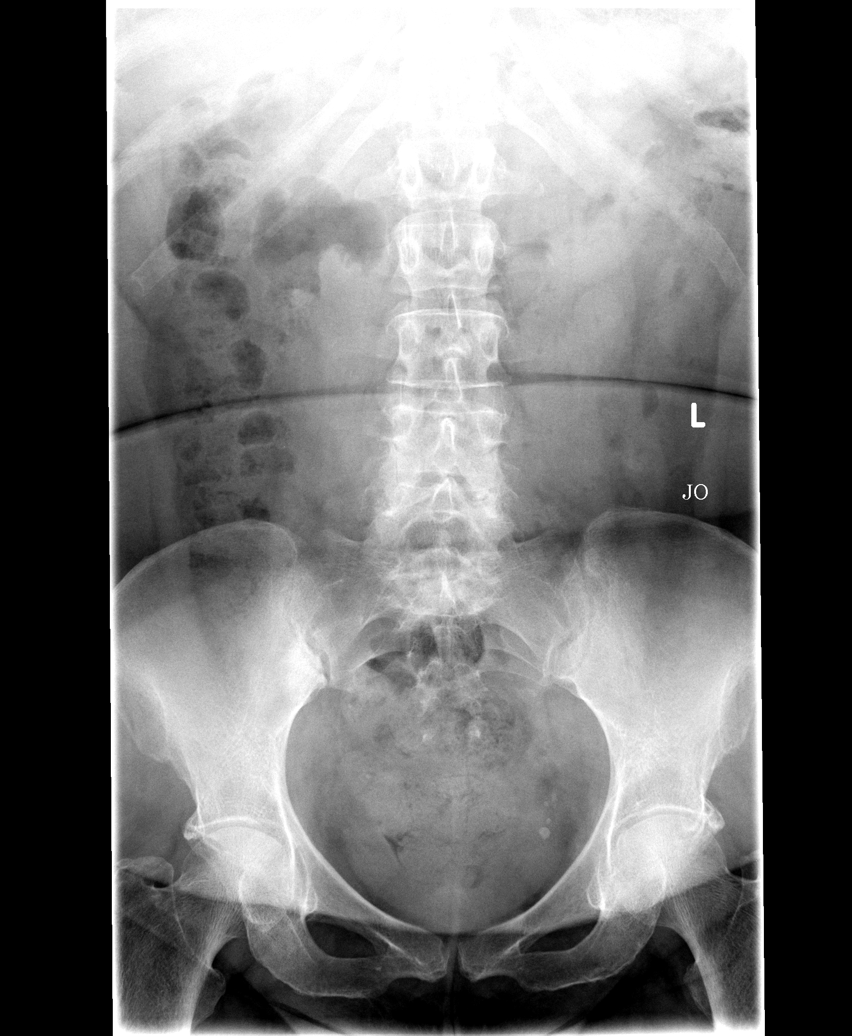

[view not recorded (2 of 5)]
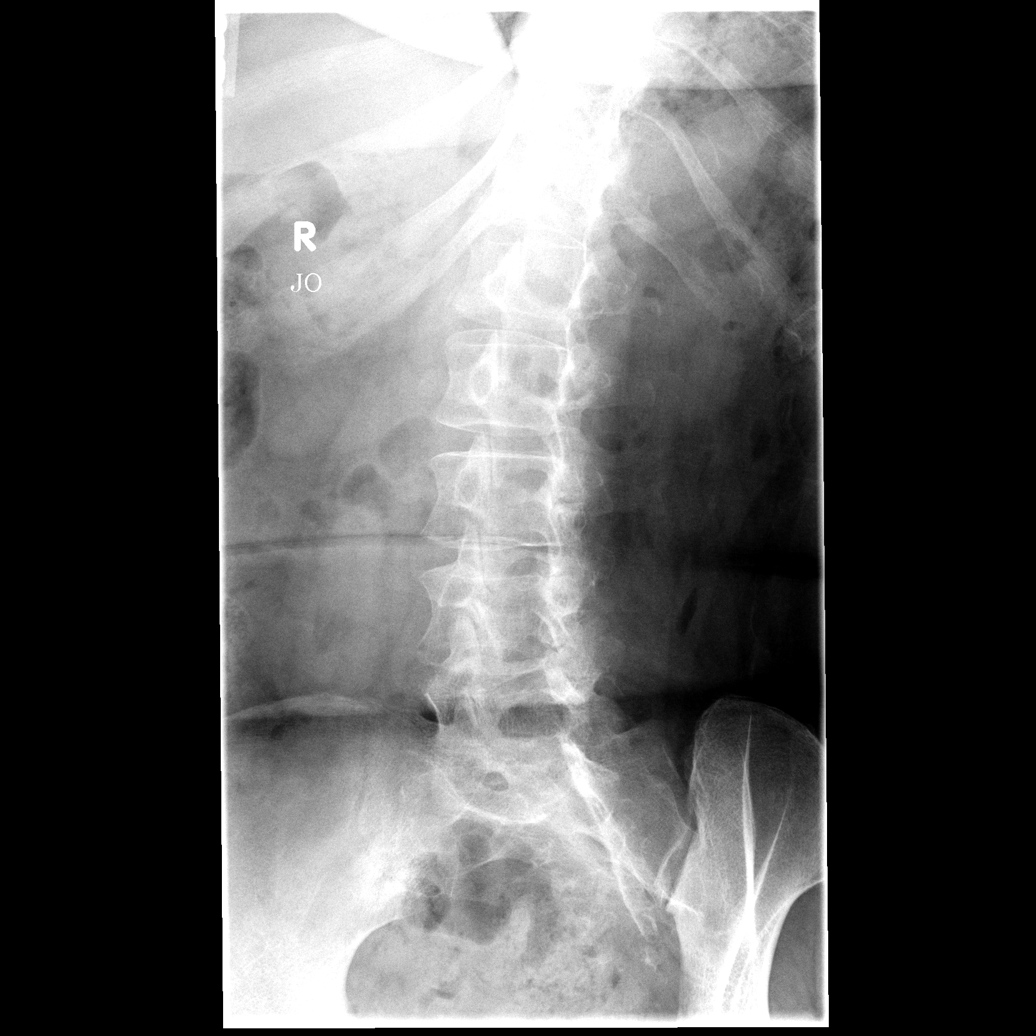

[view not recorded (3 of 5)]
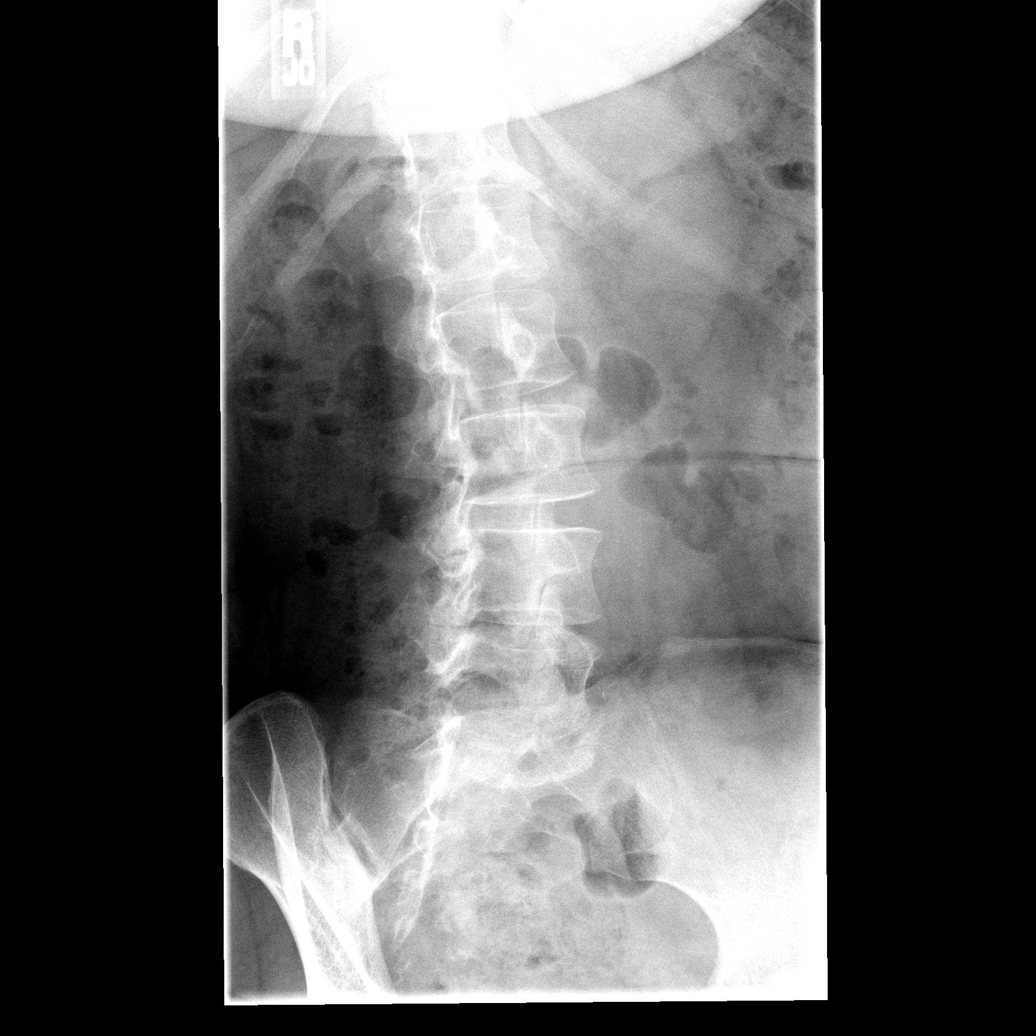

[view not recorded (4 of 5)]
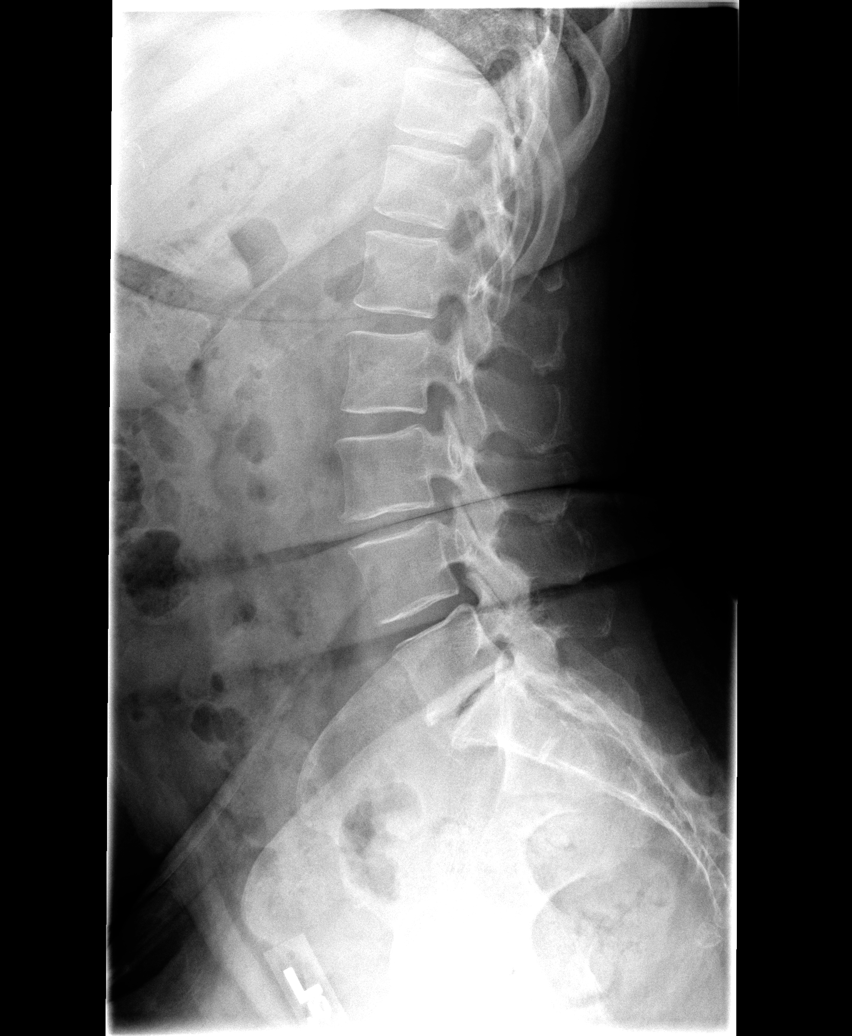

[view not recorded (5 of 5)]
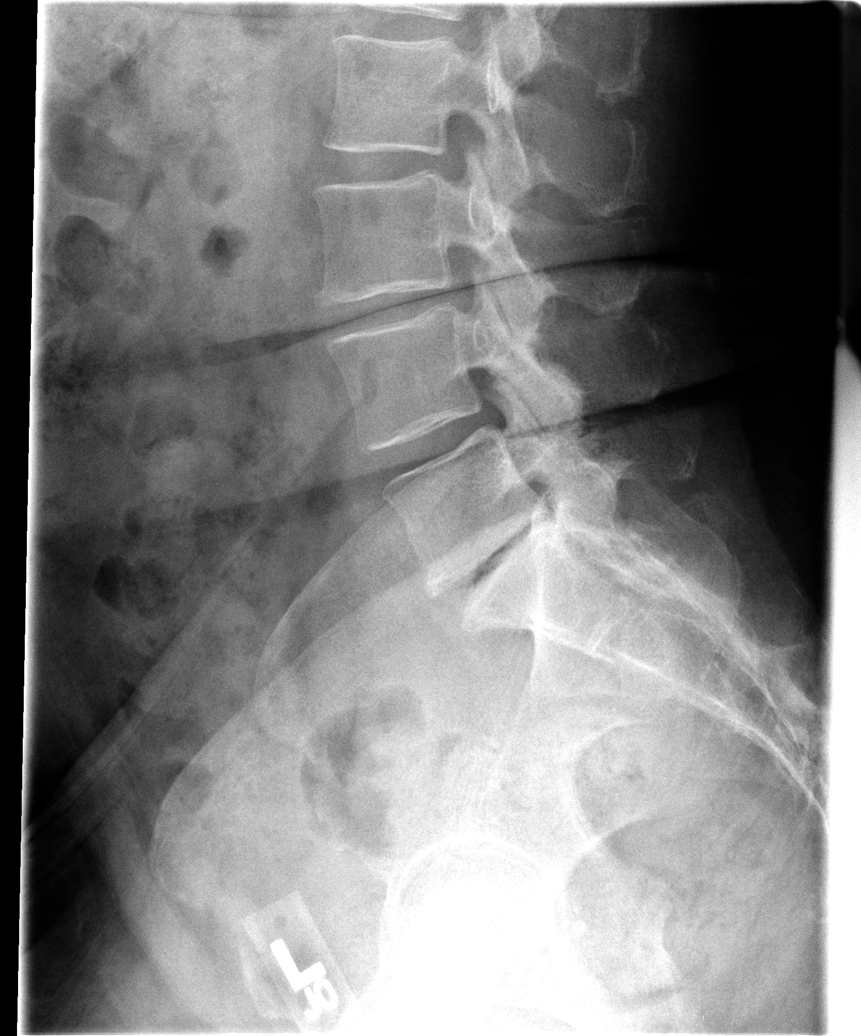

[5 of 5 positions shown; findings below may reference images not displayed]

FINDINGS: Five non-rib bearing lumbar vertebrae are seen.  Stable moderate to marked degenerative disc disease is seen at L5-S1.  Remaining disc spaces and posterior vertebral alignment are maintained.  Bilateral facet degenerative joint space narrowing is maximal from L3-4 through L5-S1.  No other new abnormality is seen.
IMPRESSION: 1.  Stable moderately severe degenerative disc disease L5-S1.
 2.  Bilateral slight facet degenerative joint disease L3-4 through L5-S1.
 3.  No acute findings.

## 2009-09-30 IMAGING — CR DG CHEST 2V
2 series · 2 of 2 positions shown · non-contrast
Comparison: 10/29/05.

CLINICAL DATA: Chest pain and cough and shortness of breath and chest congestion.  
 PA AND LATERAL CHEST - 2 VIEW:

[w chest pa]
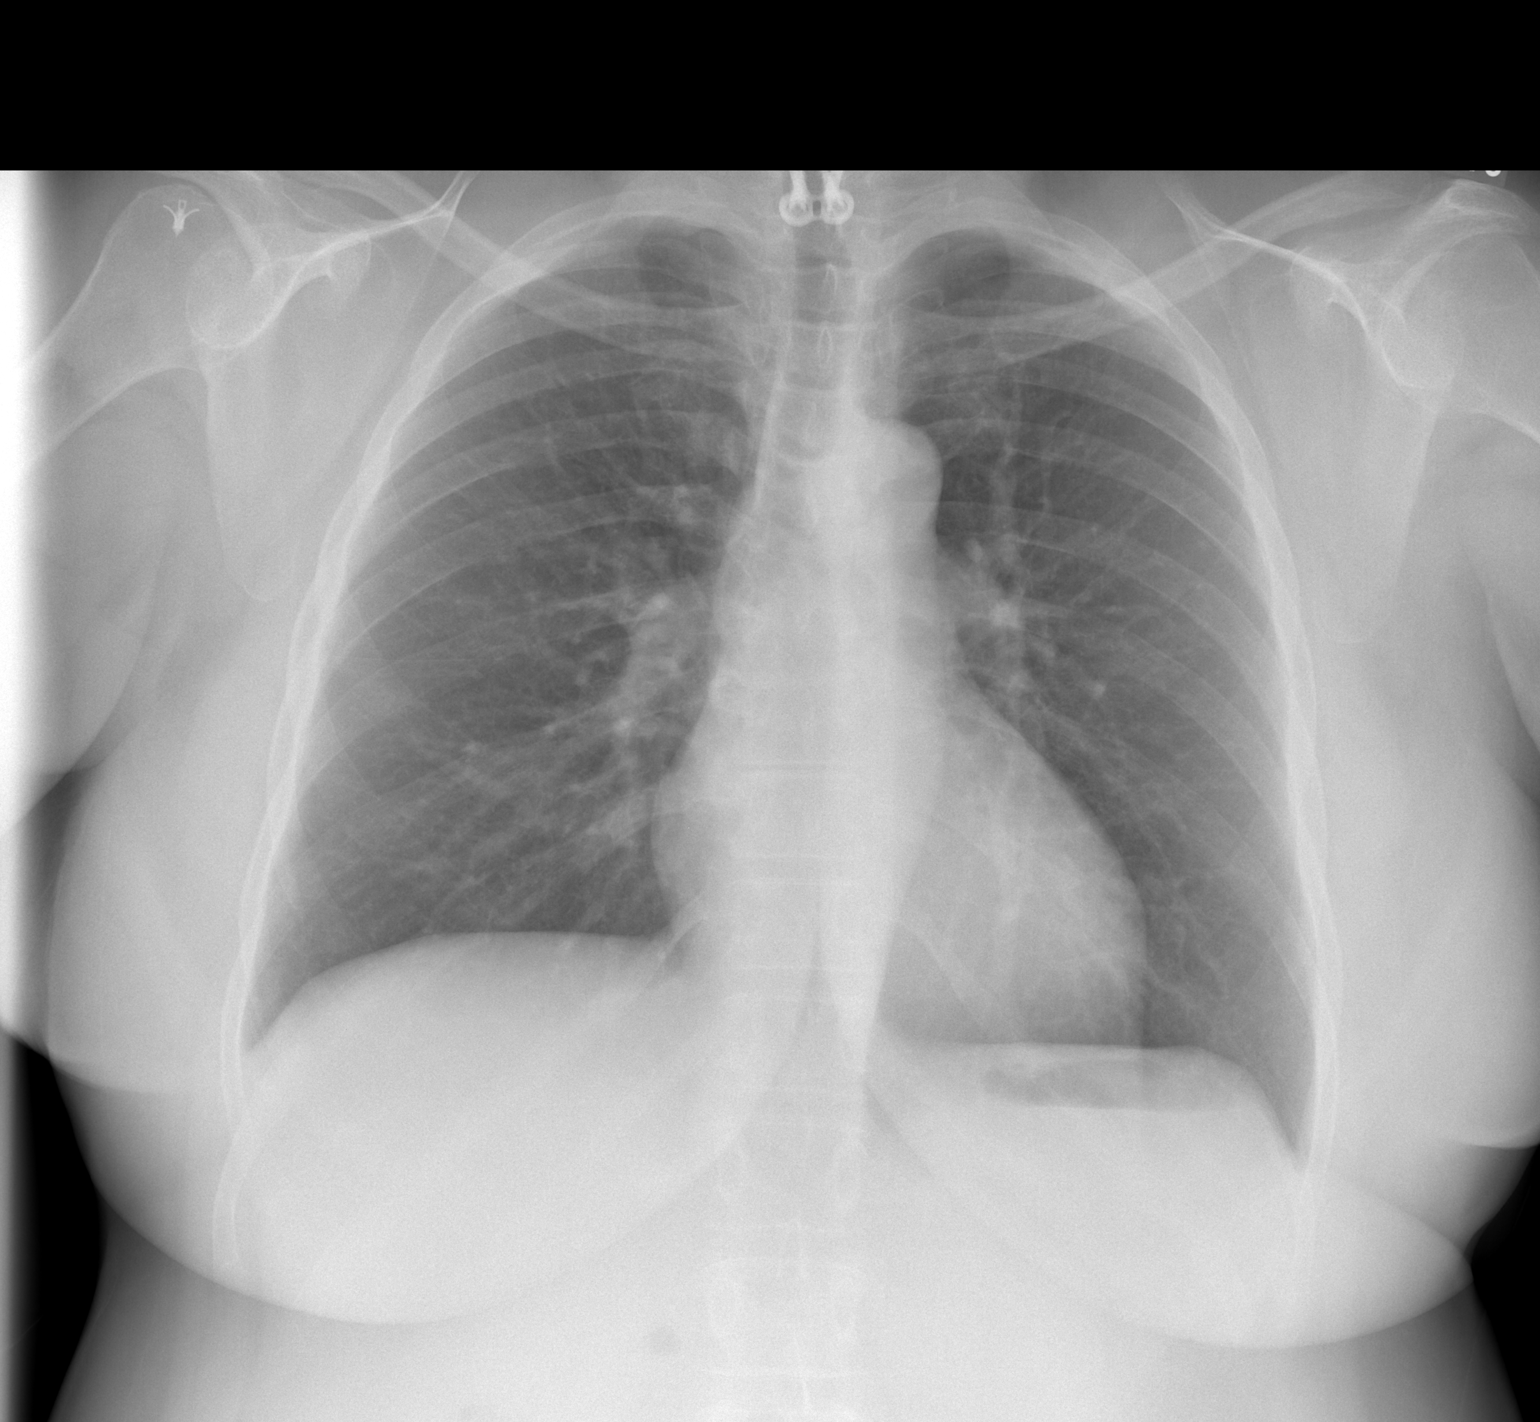

[w chest lat]
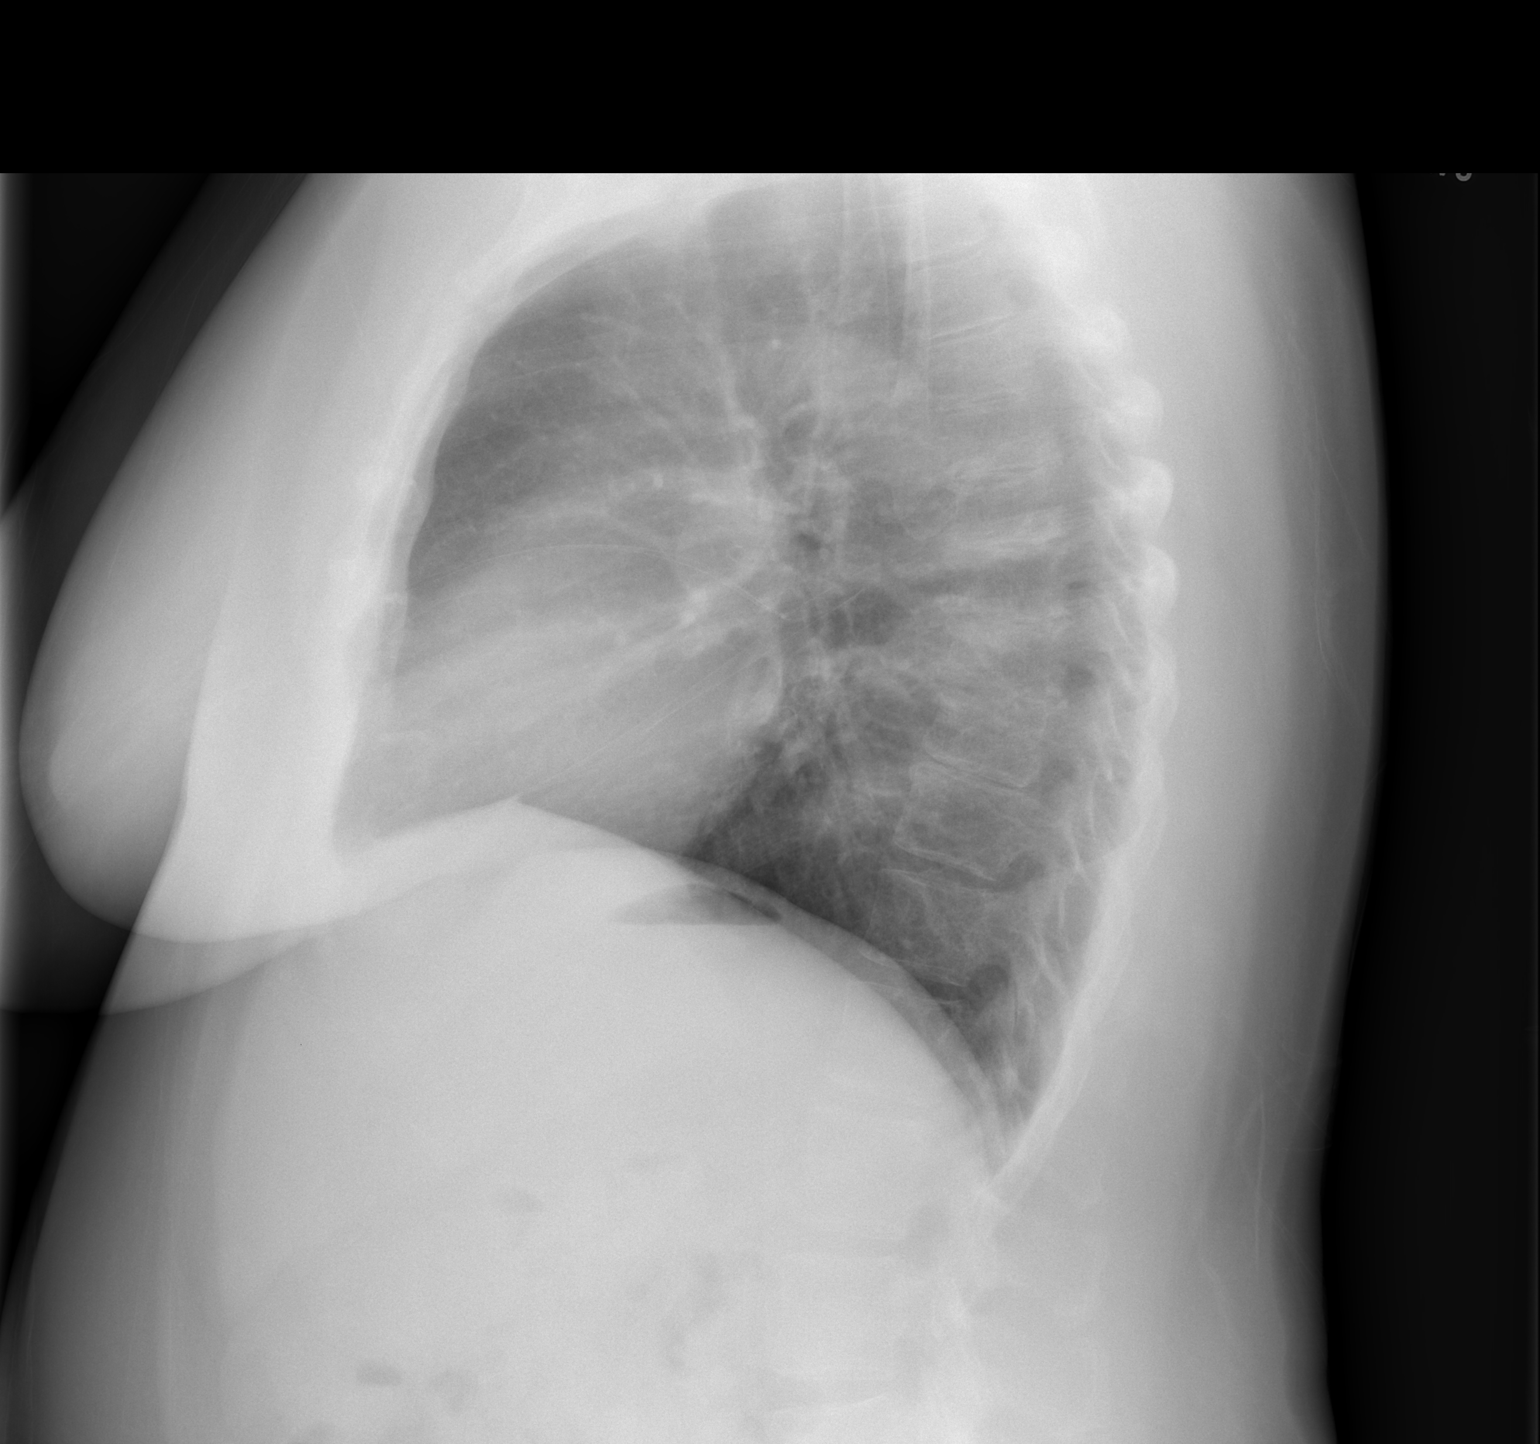

[2 of 2 positions shown; findings below may reference images not displayed]

FINDINGS: Heart size and vascularity are normal and the lungs are clear.  No significant bony abnormality.  Evidence of previous lower cervical fusion.  
 Evidence of previous right rotator cuff repair.
IMPRESSION: No acute disease in the chest.

## 2009-12-02 IMAGING — MR MR CERVICAL SPINE WO/W CM
6 of 9 series · 18 of 48 positions shown · IV contrast (magnevist)
Comparison: 11/18/05.

CLINICAL DATA: Previous cervical fusion.  Neck pain radiating to the upper back for five months.  Bilateral arm and hand pain and bilateral hand numbness. 
 MRI CERVICAL SPINE WITHOUT AND WITH CONTRAST:
TECHNIQUE: Multiplanar and multiecho pulse sequences of the cervical spine, to include the base of the skull and upper thoracic region, were obtained according to standard protocol before and after administration of intravenous contrast.
 Contrast:  14cc Magnevist.

[Series 3: T2 · sagittal · 3.0mm · 0.45mm/px · 3 of 11 slices shown (1 of 2)]
[im 1/11]
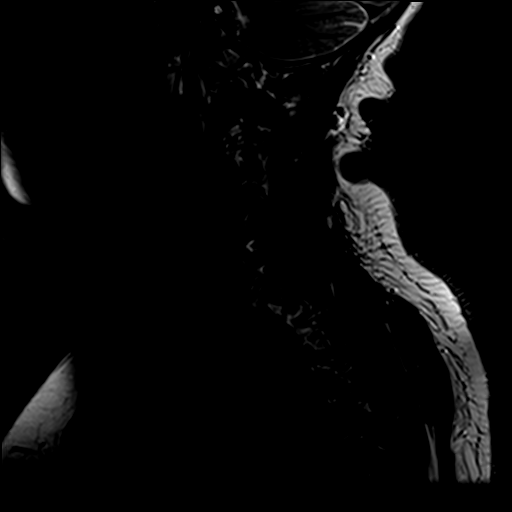
[im 6/11]
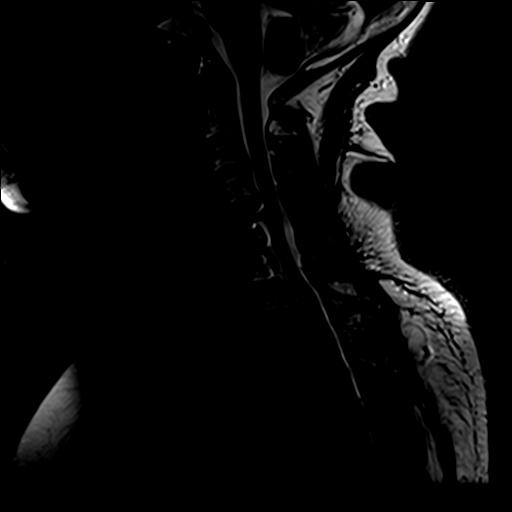
[im 11/11]
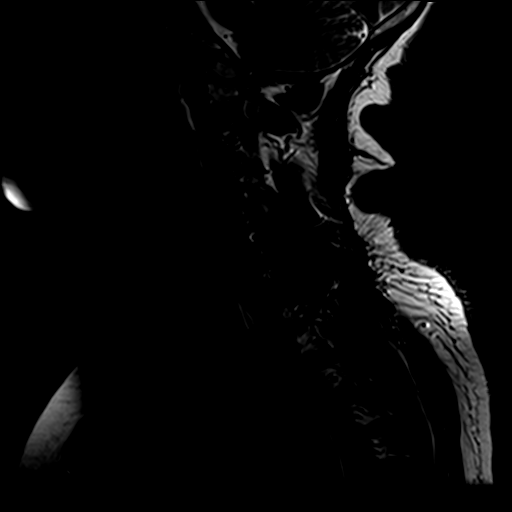

[Series 5: STIR · sagittal · 3.0mm · 0.49mm/px · 1 of 11 slices shown]
[im 1/11]
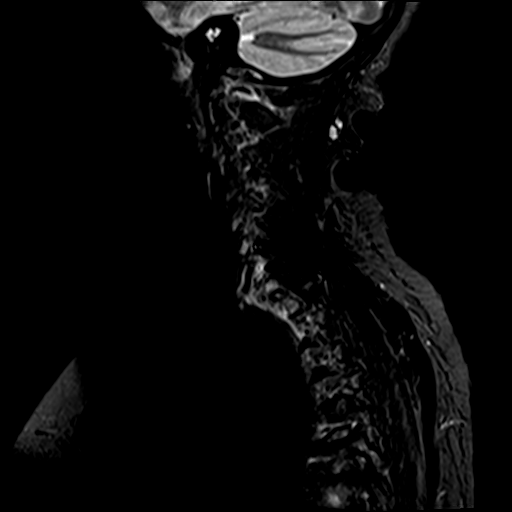

[Series 6: T1 · sagittal · 3.0mm · 0.45mm/px · 2 of 11 slices shown (1 of 2)]
[im 1/11]
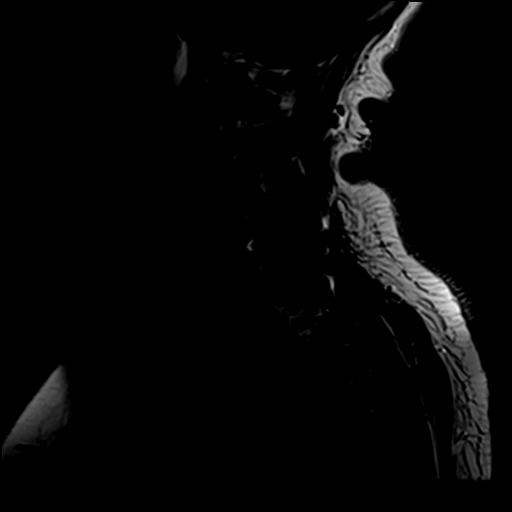
[im 11/11]
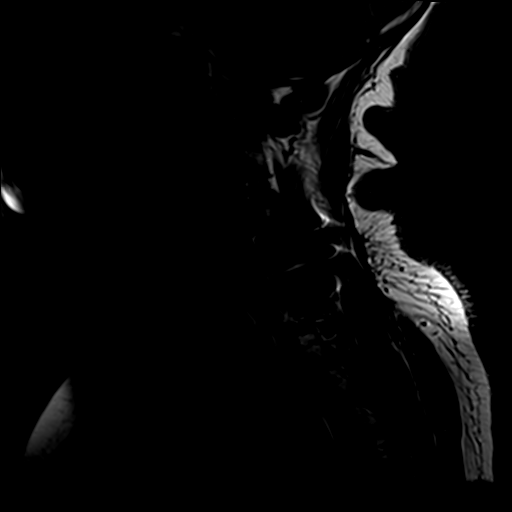

[Series 8: T2 · axial · 4.0mm · 0.49mm/px · z∈[-115,+29]mm · 5 of 27 slices shown (2 of 2)]
[im 1/27]
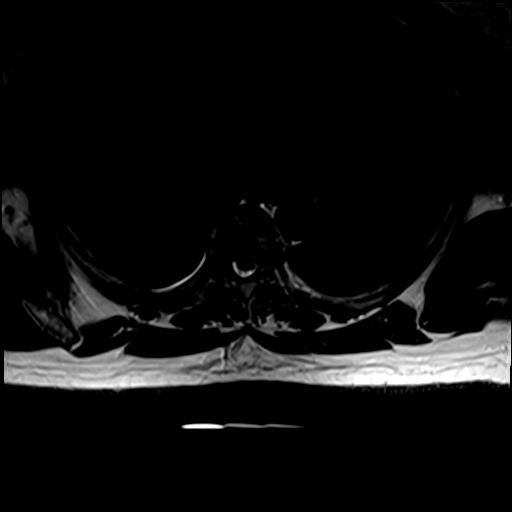
[im 7/27]
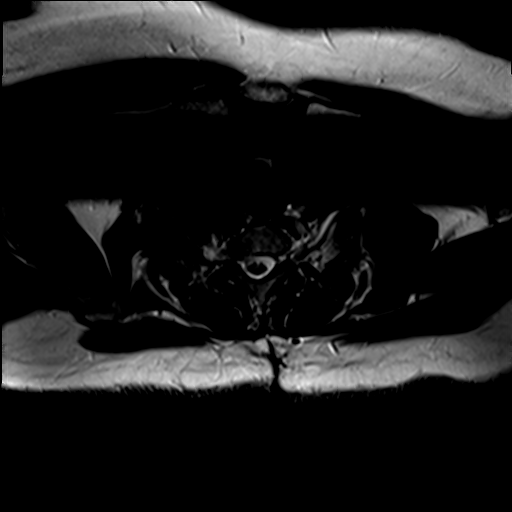
[im 14/27]
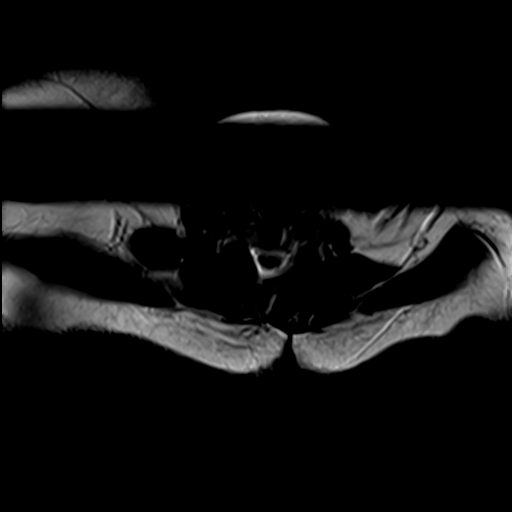
[im 20/27]
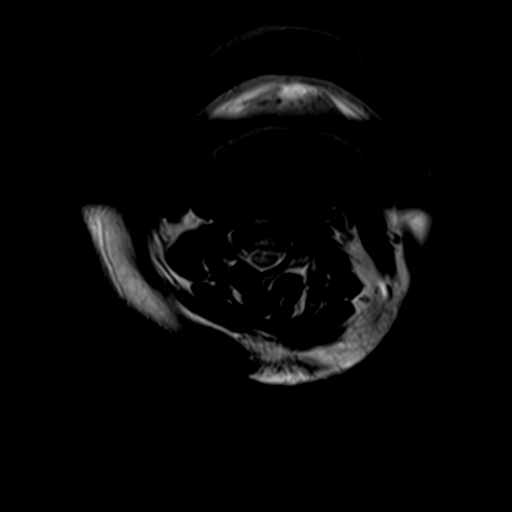
[im 27/27]
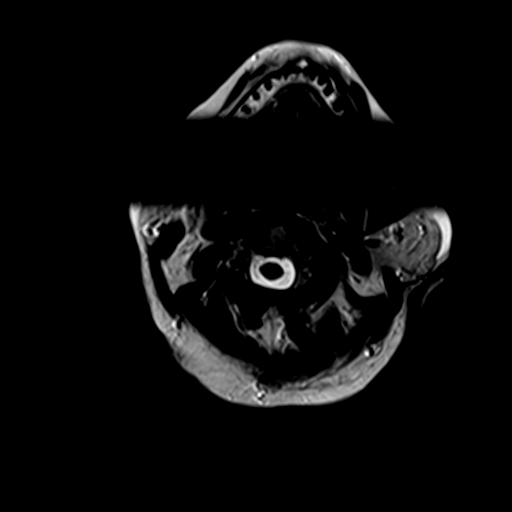

[Series 10: T1 fat-sat post-contrast · sagittal · 3.0mm · 0.49mm/px · 2 of 11 slices shown]
[im 1/11]
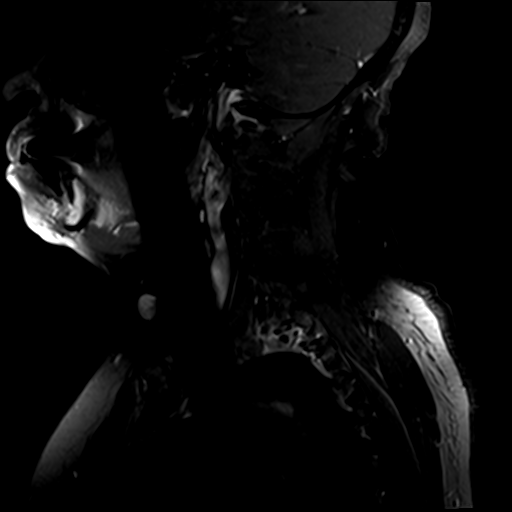
[im 11/11]
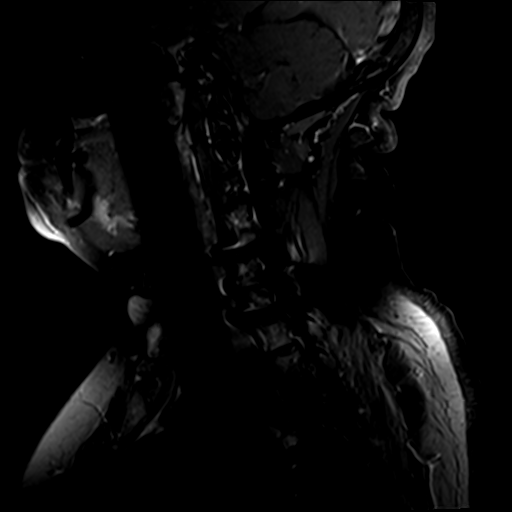

[Series 11: T1 · axial · 4.0mm · 0.49mm/px · z∈[-115,+29]mm · 5 of 27 slices shown (2 of 2)]
[im 1/27]
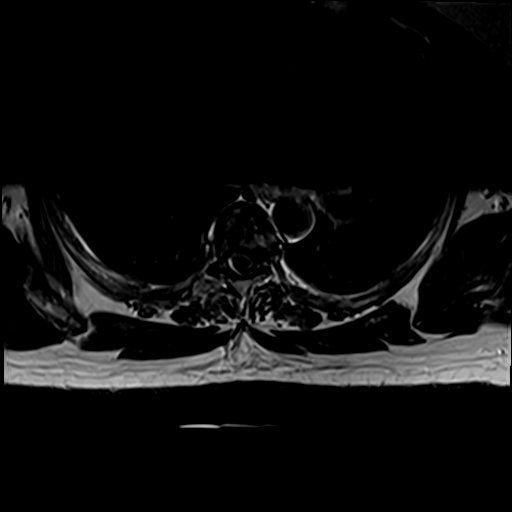
[im 7/27]
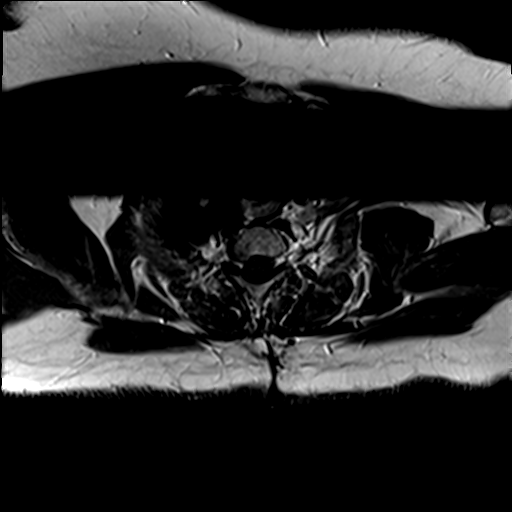
[im 14/27]
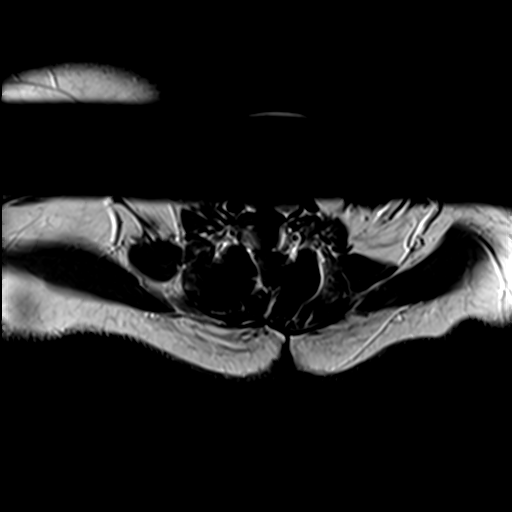
[im 20/27]
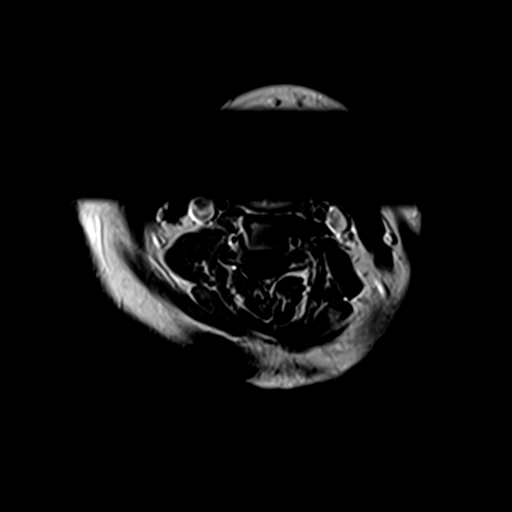
[im 27/27]
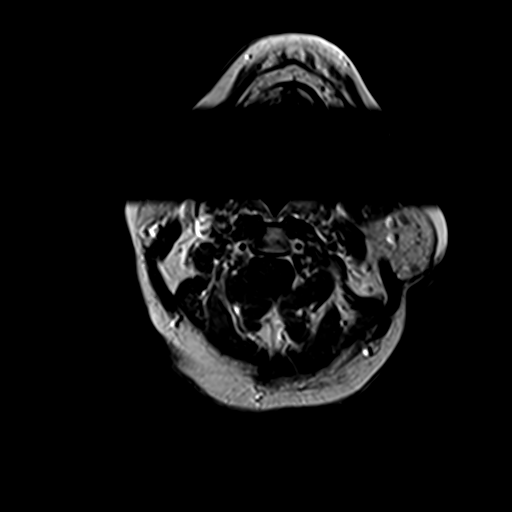

[18 of 48 positions shown; findings below may reference images not displayed]

FINDINGS: There is no abnormality at the foramen magnum or C1-2. 
 At C2-3, there is a minimal disc bulge but no herniation or significant narrowing of the canal or foramina.  Facet joints appear unremarkable.  
 At C3-4, there is moderate spondylosis with endplate osteophytes and bulging disc material that narrow the ventral subarachnoid space but do not deform the cord.  There is mild foraminal encroachment bilaterally that could possibly effect either C4 nerve root. 
 At C4-5, there is more pronounced spondylosis with endplate osteophytes covering protruding disc material that efface the ventral subarachnoid space but do not grossly compress the cord.  There is foraminal encroachment bilaterally that could effect either C5 nerve root. 
 The patient has had fusion from C5 to C7.  There is also posterior fusion at the C6-7 level.  There is a great deal of artifact from the fusion hardware, but I believe the canal is sufficiently patent throughout that region.  The foramina cannot be seen because of artifact.  
 At C7-T1, there is 2mm of anterolisthesis but no apparent compression at the cord.  There is a good bit of image distortion because of the artifact.  The foramina cannot be accurately evaluated.  
 In the upper thoracic region, there are disc protrusions from T1-2 through T5-6 that indent the ventral subarachnoid space but do not appear to grossly compress the cord.
IMPRESSION: 1.  No significant change noted since [REDACTED] of last year. 
 2.  Fusion from C5 to C7 has a satisfactory appearance.  There is a great deal of artifact related to the fusion hardware but I do not see any complication in that region. 
 3.  Degenerative spondylosis worse at C4-5 and at C3-4.  The cord is not compressed but there is foraminal encroachment by osteophytes bilaterally at both of these levels, more notable at C4-5. 
 4.  Degenerative facet disease at C7-T1 with 2mm of anterolisthesis but no gross encroachment upon the neural spaces.  Details limited because of metal artifact. 
 5.  Spondylosis and shallow disc protrusions in the upper thoracic region that narrow the ventral subarachnoid space but do not grossly compress the cord.

## 2010-02-13 ENCOUNTER — Encounter
Admission: RE | Admit: 2010-02-13 | Discharge: 2010-02-13 | Payer: Self-pay | Source: Home / Self Care | Attending: Family Medicine | Admitting: Family Medicine

## 2010-03-11 ENCOUNTER — Encounter: Payer: Self-pay | Admitting: Emergency Medicine

## 2010-03-11 ENCOUNTER — Encounter: Payer: Self-pay | Admitting: Neurosurgery

## 2010-03-26 IMAGING — CR DG CHEST 2V
2 series · 2 of 2 positions shown · non-contrast
Comparison: 05/10/2007

CLINICAL DATA: Chest pain and dyspnea.

CHEST - 2 VIEW

[w chest lat]
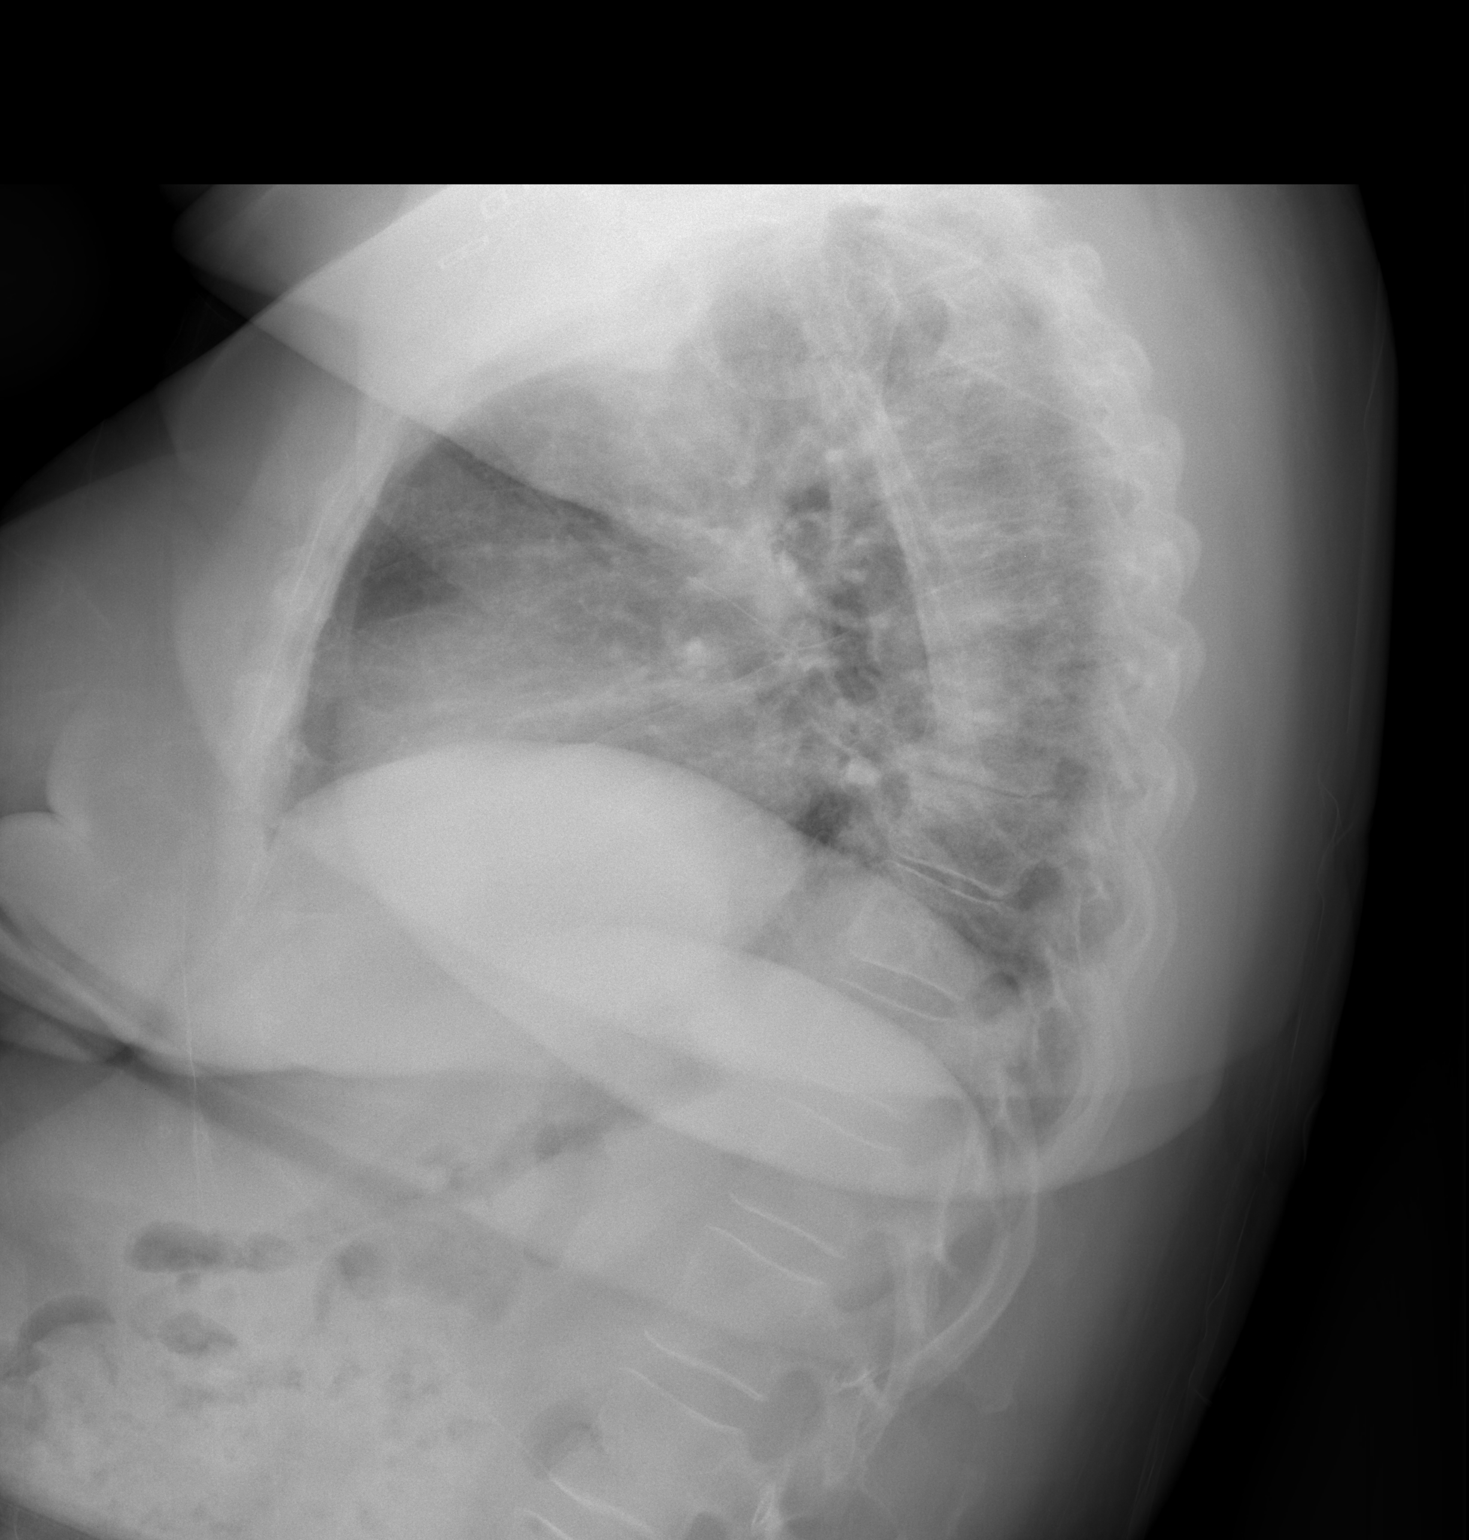

[view not recorded]
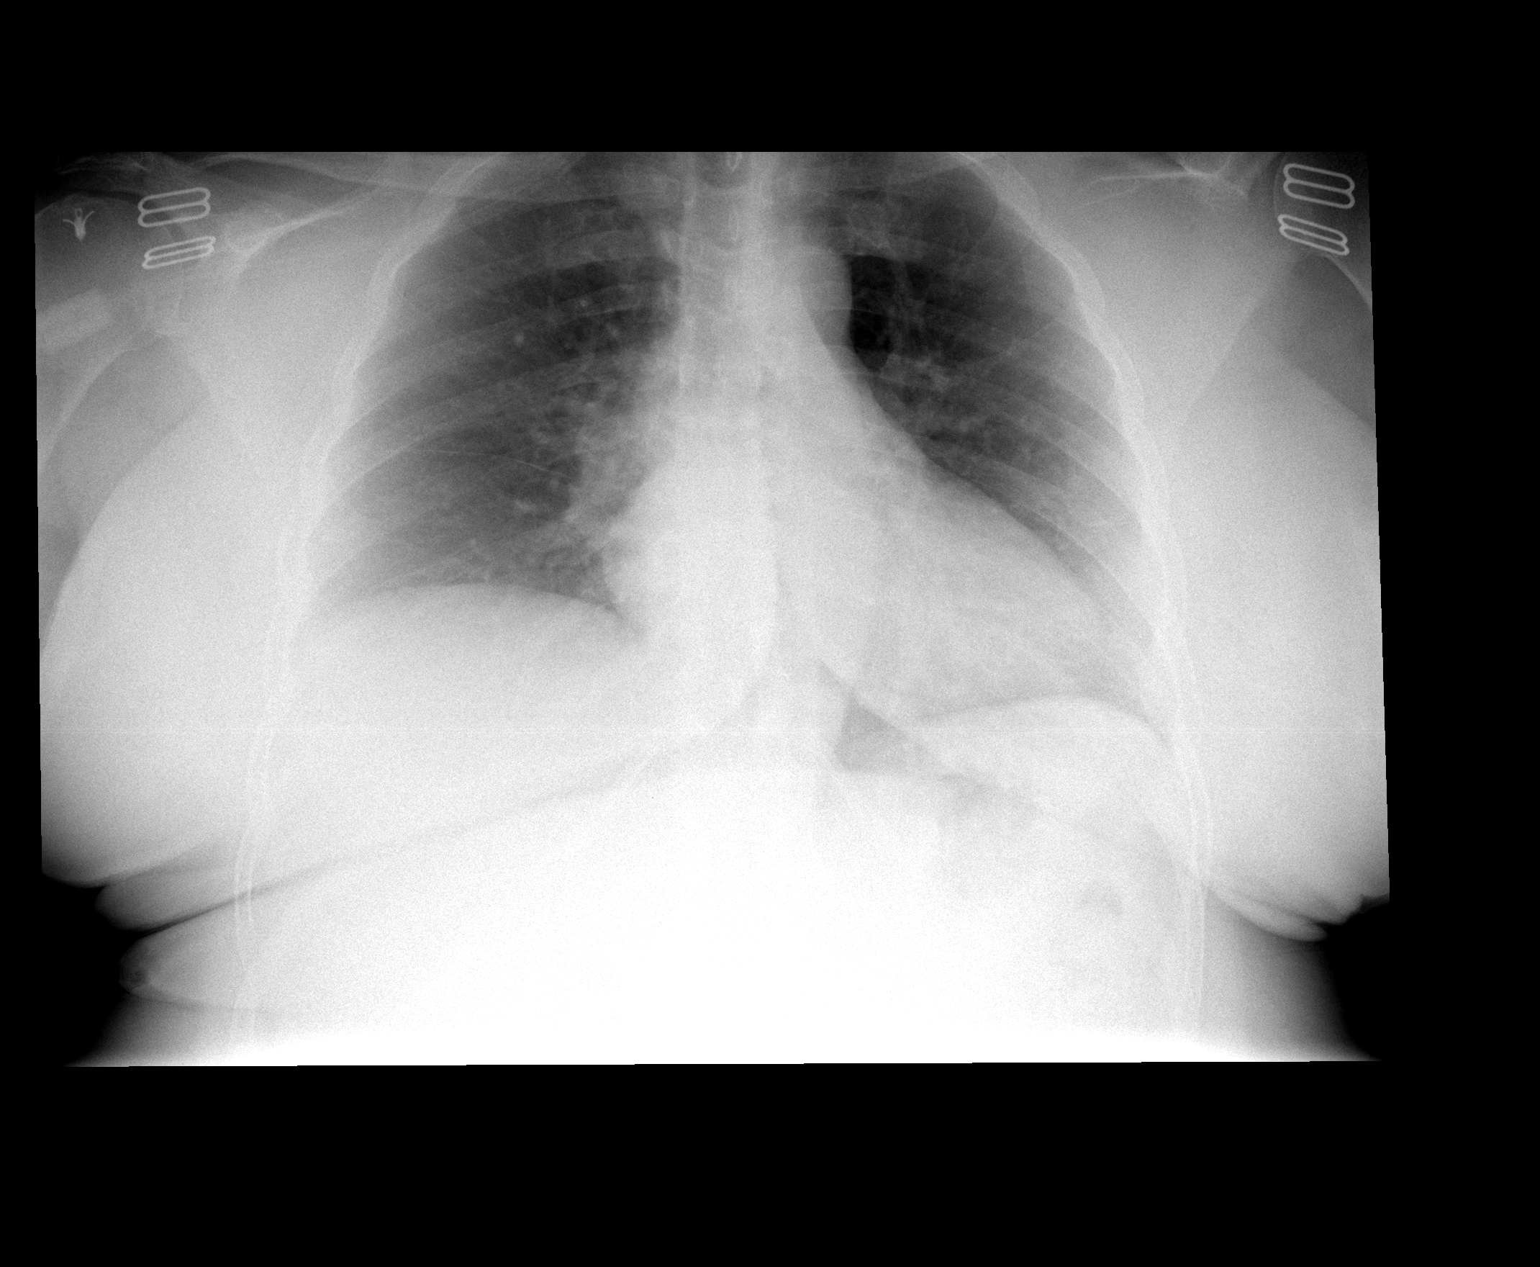

[2 of 2 positions shown; findings below may reference images not displayed]

FINDINGS: Cardiomegaly and mild pulmonary vascular congestion
noted.
No definite focal airspace disease, pleural effusions, or
pneumothorax.
Mild elevation of the right hemidiaphragm again identified.
IMPRESSION: Cardiomegaly with pulmonary vascular congestion.

## 2010-04-25 ENCOUNTER — Ambulatory Visit (HOSPITAL_COMMUNITY)
Admission: RE | Admit: 2010-04-25 | Discharge: 2010-04-25 | Disposition: A | Discharge status: Home / Self Care | Payer: Medicare Other | Source: Ambulatory Visit | Attending: Urology | Admitting: Urology

## 2010-04-25 ENCOUNTER — Other Ambulatory Visit (HOSPITAL_COMMUNITY): Payer: Self-pay | Admitting: Urology

## 2010-04-25 DIAGNOSIS — R0602 Shortness of breath: Secondary | ICD-10-CM | POA: Insufficient documentation

## 2010-04-25 DIAGNOSIS — D4959 Neoplasm of unspecified behavior of other genitourinary organ: Secondary | ICD-10-CM | POA: Insufficient documentation

## 2010-04-25 DIAGNOSIS — Z79899 Other long term (current) drug therapy: Secondary | ICD-10-CM | POA: Insufficient documentation

## 2010-04-25 DIAGNOSIS — R079 Chest pain, unspecified: Secondary | ICD-10-CM | POA: Insufficient documentation

## 2010-04-25 DIAGNOSIS — I10 Essential (primary) hypertension: Secondary | ICD-10-CM | POA: Insufficient documentation

## 2010-05-06 LAB — COMPREHENSIVE METABOLIC PANEL
Albumin: 3.9 g/dL (ref 3.5–5.2)
BUN: 14 mg/dL (ref 6–23)
Chloride: 106 mEq/L (ref 96–112)
Creatinine, Ser: 1.02 mg/dL (ref 0.4–1.2)
GFR calc non Af Amer: 54 mL/min — ABNORMAL LOW (ref >60)
Glucose, Bld: 86 mg/dL (ref 70–99)
Total Bilirubin: 0.7 mg/dL (ref 0.3–1.2)

## 2010-05-06 LAB — URINE MICROSCOPIC-ADD ON

## 2010-05-06 LAB — PROTIME-INR
INR: 0.94 (ref 0.00–1.49)
Prothrombin Time: 12.5 seconds (ref 11.6–15.2)

## 2010-05-06 LAB — URINALYSIS, ROUTINE W REFLEX MICROSCOPIC
Glucose, UA: NEGATIVE mg/dL
Hgb urine dipstick: NEGATIVE
Specific Gravity, Urine: 1.018 (ref 1.005–1.030)
pH: 5 (ref 5.0–8.0)

## 2010-05-06 LAB — DIFFERENTIAL
Basophils Absolute: 0 10*3/uL (ref 0.0–0.1)
Basophils Relative: 0 % (ref 0–1)
Monocytes Relative: 3 % (ref 3–12)
Neutro Abs: 6.6 10*3/uL (ref 1.7–7.7)
Neutrophils Relative %: 94 % — ABNORMAL HIGH (ref 43–77)

## 2010-05-06 LAB — CBC
HCT: 42.6 % (ref 36.0–46.0)
Hemoglobin: 14.5 g/dL (ref 12.0–15.0)
RDW: 14.3 % (ref 11.5–15.5)

## 2010-05-06 LAB — URINE CULTURE
Colony Count: NO GROWTH
Culture: NO GROWTH

## 2010-05-06 LAB — LIPASE, BLOOD: Lipase: 19 U/L (ref 11–59)

## 2010-05-06 LAB — GLUCOSE, CAPILLARY: Glucose-Capillary: 155 mg/dL — ABNORMAL HIGH (ref 70–99)

## 2010-05-06 LAB — HEMOCCULT GUIAC POC 1CARD (OFFICE): Fecal Occult Bld: POSITIVE

## 2010-05-07 LAB — CBC
MCHC: 33.6 g/dL (ref 30.0–36.0)
MCV: 94.7 fL (ref 78.0–100.0)
Platelets: 207 10*3/uL (ref 150–400)
WBC: 4.9 10*3/uL (ref 4.0–10.5)

## 2010-05-07 LAB — POCT I-STAT, CHEM 8
HCT: 41 % (ref 36.0–46.0)
Hemoglobin: 13.9 g/dL (ref 12.0–15.0)
Sodium: 141 mEq/L (ref 135–145)
TCO2: 26 mmol/L (ref 0–100)

## 2010-05-07 LAB — GLUCOSE, CAPILLARY: Glucose-Capillary: 216 mg/dL — ABNORMAL HIGH (ref 70–99)

## 2010-05-07 LAB — DIFFERENTIAL
Basophils Relative: 0 % (ref 0–1)
Eosinophils Absolute: 0.2 10*3/uL (ref 0.0–0.7)
Lymphs Abs: 1.8 10*3/uL (ref 0.7–4.0)
Neutrophils Relative %: 50 % (ref 43–77)

## 2010-05-07 LAB — URINALYSIS, ROUTINE W REFLEX MICROSCOPIC
Bilirubin Urine: NEGATIVE
Ketones, ur: NEGATIVE mg/dL
Nitrite: NEGATIVE
Protein, ur: NEGATIVE mg/dL
Urobilinogen, UA: 0.2 mg/dL (ref 0.0–1.0)
pH: 5.5 (ref 5.0–8.0)

## 2010-05-07 LAB — URINE CULTURE: Colony Count: 50000

## 2010-05-07 LAB — URINE MICROSCOPIC-ADD ON

## 2010-05-07 LAB — LIPASE, BLOOD: Lipase: 34 U/L (ref 11–59)

## 2010-05-13 LAB — URINALYSIS, ROUTINE W REFLEX MICROSCOPIC
Bilirubin Urine: NEGATIVE
Glucose, UA: NEGATIVE mg/dL
Hgb urine dipstick: NEGATIVE
Ketones, ur: NEGATIVE mg/dL
Nitrite: NEGATIVE
Protein, ur: NEGATIVE mg/dL
Specific Gravity, Urine: 1.019 (ref 1.005–1.030)
Urobilinogen, UA: 0.2 mg/dL (ref 0.0–1.0)
pH: 6 (ref 5.0–8.0)

## 2010-05-13 LAB — POCT CARDIAC MARKERS
CKMB, poc: 1.1 ng/mL (ref 1.0–8.0)
Myoglobin, poc: 107 ng/mL (ref 12–200)
Troponin i, poc: <0.05 ng/mL (ref 0.00–0.09)

## 2010-05-13 LAB — DIFFERENTIAL
Basophils Absolute: 0 10*3/uL (ref 0.0–0.1)
Basophils Relative: 0 % (ref 0–1)
Eosinophils Absolute: 0.2 10*3/uL (ref 0.0–0.7)
Eosinophils Relative: 3 % (ref 0–5)
Lymphocytes Relative: 37 % (ref 12–46)
Lymphs Abs: 2.1 10*3/uL (ref 0.7–4.0)
Monocytes Absolute: 0.5 10*3/uL (ref 0.1–1.0)
Monocytes Relative: 10 % (ref 3–12)
Neutro Abs: 2.8 10*3/uL (ref 1.7–7.7)
Neutrophils Relative %: 49 % (ref 43–77)

## 2010-05-13 LAB — CBC
HCT: 41.4 % (ref 36.0–46.0)
Hemoglobin: 13.3 g/dL (ref 12.0–15.0)
MCHC: 32.2 g/dL (ref 30.0–36.0)
MCV: 95.3 fL (ref 78.0–100.0)
Platelets: 232 10*3/uL (ref 150–400)
RBC: 4.35 MIL/uL (ref 3.87–5.11)
RDW: 14.4 % (ref 11.5–15.5)
WBC: 5.7 10*3/uL (ref 4.0–10.5)

## 2010-05-13 LAB — POCT I-STAT, CHEM 8
BUN: 17 mg/dL (ref 6–23)
Creatinine, Ser: 1.1 mg/dL (ref 0.4–1.2)
Hemoglobin: 14.3 g/dL (ref 12.0–15.0)
Potassium: 4 mEq/L (ref 3.5–5.1)
Sodium: 142 mEq/L (ref 135–145)

## 2010-05-26 LAB — CARDIAC PANEL(CRET KIN+CKTOT+MB+TROPI)
CK, MB: 0.4 ng/mL (ref 0.3–4.0)
CK, MB: 0.6 ng/mL (ref 0.3–4.0)
CK, MB: 0.8 ng/mL (ref 0.3–4.0)
Relative Index: INVALID (ref 0.0–2.5)
Relative Index: INVALID (ref 0.0–2.5)
Relative Index: INVALID (ref 0.0–2.5)
Total CK: 43 U/L (ref 7–177)
Total CK: 43 U/L (ref 7–177)
Total CK: 47 U/L (ref 7–177)
Troponin I: 0.01 ng/mL (ref 0.00–0.06)

## 2010-05-26 LAB — BASIC METABOLIC PANEL
BUN: 9 mg/dL (ref 6–23)
BUN: 9 mg/dL (ref 6–23)
BUN: 11 mg/dL (ref 6–23)
CO2: 28 mEq/L (ref 19–32)
CO2: 30 mEq/L (ref 19–32)
Calcium: 8.7 mg/dL (ref 8.4–10.5)
Calcium: 9.8 mg/dL (ref 8.4–10.5)
Chloride: 106 mEq/L (ref 96–112)
Chloride: 108 mEq/L (ref 96–112)
Chloride: 108 mEq/L (ref 96–112)
Creatinine, Ser: 0.96 mg/dL (ref 0.4–1.2)
Creatinine, Ser: 0.99 mg/dL (ref 0.4–1.2)
Creatinine, Ser: 0.84 mg/dL (ref 0.4–1.2)
Glucose, Bld: 149 mg/dL — ABNORMAL HIGH (ref 70–99)
Glucose, Bld: 238 mg/dL — ABNORMAL HIGH (ref 70–99)
Glucose, Bld: 86 mg/dL (ref 70–99)
Potassium: 4.4 mEq/L (ref 3.5–5.1)

## 2010-05-26 LAB — CREATININE, FLUID (PLEURAL, PERITONEAL, JP DRAINAGE): Creat, Fluid: 1 mg/dL

## 2010-05-26 LAB — URINE MICROSCOPIC-ADD ON

## 2010-05-26 LAB — GLUCOSE, CAPILLARY
Glucose-Capillary: 149 mg/dL — ABNORMAL HIGH (ref 70–99)
Glucose-Capillary: 160 mg/dL — ABNORMAL HIGH (ref 70–99)
Glucose-Capillary: 161 mg/dL — ABNORMAL HIGH (ref 70–99)
Glucose-Capillary: 176 mg/dL — ABNORMAL HIGH (ref 70–99)
Glucose-Capillary: 182 mg/dL — ABNORMAL HIGH (ref 70–99)
Glucose-Capillary: 198 mg/dL — ABNORMAL HIGH (ref 70–99)
Glucose-Capillary: 200 mg/dL — ABNORMAL HIGH (ref 70–99)
Glucose-Capillary: 203 mg/dL — ABNORMAL HIGH (ref 70–99)
Glucose-Capillary: 213 mg/dL — ABNORMAL HIGH (ref 70–99)
Glucose-Capillary: 213 mg/dL — ABNORMAL HIGH (ref 70–99)
Glucose-Capillary: 219 mg/dL — ABNORMAL HIGH (ref 70–99)
Glucose-Capillary: 221 mg/dL — ABNORMAL HIGH (ref 70–99)
Glucose-Capillary: 227 mg/dL — ABNORMAL HIGH (ref 70–99)
Glucose-Capillary: 227 mg/dL — ABNORMAL HIGH (ref 70–99)
Glucose-Capillary: 97 mg/dL (ref 70–99)

## 2010-05-26 LAB — POCT I-STAT, CHEM 8
Calcium, Ion: 1.14 mmol/L (ref 1.12–1.32)
Chloride: 104 mEq/L (ref 96–112)
HCT: 38 % (ref 36.0–46.0)
Potassium: 4.6 mEq/L (ref 3.5–5.1)
Sodium: 139 mEq/L (ref 135–145)

## 2010-05-26 LAB — URINE CULTURE

## 2010-05-26 LAB — CBC
HCT: 36.8 % (ref 36.0–46.0)
Hemoglobin: 12.2 g/dL (ref 12.0–15.0)
MCHC: 33.3 g/dL (ref 30.0–36.0)
MCHC: 33.3 g/dL (ref 30.0–36.0)
MCV: 94.2 fL (ref 78.0–100.0)
Platelets: 206 10*3/uL (ref 150–400)
RBC: 3.89 MIL/uL (ref 3.87–5.11)
RBC: 4.29 MIL/uL (ref 3.87–5.11)
RDW: 14 % (ref 11.5–15.5)

## 2010-05-26 LAB — BRAIN NATRIURETIC PEPTIDE: Pro B Natriuretic peptide (BNP): <30 pg/mL (ref 0.0–100.0)

## 2010-05-26 LAB — TYPE AND SCREEN: ABO/RH(D): O POS

## 2010-05-26 LAB — URINALYSIS, ROUTINE W REFLEX MICROSCOPIC
Leukocytes, UA: NEGATIVE
Nitrite: NEGATIVE
Specific Gravity, Urine: 1.009 (ref 1.005–1.030)
Urobilinogen, UA: 0.2 mg/dL (ref 0.0–1.0)

## 2010-05-26 LAB — CK TOTAL AND CKMB (NOT AT ARMC)
CK, MB: 0.8 ng/mL (ref 0.3–4.0)
Total CK: 74 U/L (ref 7–177)

## 2010-05-26 LAB — DIFFERENTIAL
Basophils Relative: 0 % (ref 0–1)
Eosinophils Relative: 4 % (ref 0–5)
Lymphocytes Relative: 25 % (ref 12–46)
Monocytes Absolute: 0.8 10*3/uL (ref 0.1–1.0)
Monocytes Relative: 11 % (ref 3–12)
Neutro Abs: 4.6 10*3/uL (ref 1.7–7.7)

## 2010-05-26 LAB — HEMOGLOBIN AND HEMATOCRIT, BLOOD
HCT: 34.5 % — ABNORMAL LOW (ref 36.0–46.0)
HCT: 39.6 % (ref 36.0–46.0)

## 2010-05-26 LAB — APTT: aPTT: 28 seconds (ref 24–37)

## 2010-05-26 LAB — PROTIME-INR: INR: 0.9 (ref 0.00–1.49)

## 2010-05-28 LAB — CBC
HCT: 40.6 % (ref 36.0–46.0)
Platelets: 218 10*3/uL (ref 150–400)
RDW: 13.8 % (ref 11.5–15.5)

## 2010-05-28 LAB — URINALYSIS, ROUTINE W REFLEX MICROSCOPIC
Glucose, UA: 250 mg/dL — AB
Ketones, ur: NEGATIVE mg/dL
Protein, ur: NEGATIVE mg/dL
Urobilinogen, UA: 0.2 mg/dL (ref 0.0–1.0)

## 2010-05-28 LAB — DIFFERENTIAL
Basophils Absolute: 0 10*3/uL (ref 0.0–0.1)
Eosinophils Absolute: 0.2 10*3/uL (ref 0.0–0.7)
Eosinophils Relative: 4 % (ref 0–5)
Lymphocytes Relative: 42 % (ref 12–46)
Neutrophils Relative %: 44 % (ref 43–77)

## 2010-05-28 LAB — POCT I-STAT, CHEM 8
BUN: 16 mg/dL (ref 6–23)
Hemoglobin: 13.9 g/dL (ref 12.0–15.0)
Sodium: 141 mEq/L (ref 135–145)
TCO2: 27 mmol/L (ref 0–100)

## 2010-06-25 ENCOUNTER — Ambulatory Visit: Payer: Medicare Other | Attending: Neurosurgery

## 2010-06-25 DIAGNOSIS — IMO0001 Reserved for inherently not codable concepts without codable children: Secondary | ICD-10-CM | POA: Insufficient documentation

## 2010-06-25 DIAGNOSIS — M542 Cervicalgia: Secondary | ICD-10-CM | POA: Insufficient documentation

## 2010-06-25 DIAGNOSIS — M2569 Stiffness of other specified joint, not elsewhere classified: Secondary | ICD-10-CM | POA: Insufficient documentation

## 2010-06-25 DIAGNOSIS — M25519 Pain in unspecified shoulder: Secondary | ICD-10-CM | POA: Insufficient documentation

## 2010-06-26 ENCOUNTER — Ambulatory Visit: Payer: Medicare Other

## 2010-07-02 ENCOUNTER — Ambulatory Visit: Payer: Medicare Other | Admitting: Physical Therapy

## 2010-07-03 NOTE — Discharge Summary (Signed)
NAMEJONIAH, NAMETH                ACCOUNT NO.:  [id number]   MEDICAL RECORD NO.:  [id number]          PATIENT TYPE:  INP   LOCATION:  [id number]                         FACILITY:  [facility]   PHYSICIAN:  Raynelle Bring, MD      DATE OF BIRTH:  Aug 30, 1940   DATE OF ADMISSION:  10/06/2008  DATE OF DISCHARGE:  10/09/2008                               DISCHARGE SUMMARY   ADMISSION DIAGNOSIS:  Left renal neoplasm.   DISCHARGE DIAGNOSIS:  Renal cell carcinoma.   HISTORY OF PRESENT ILLNESS:  For full details please see admission  history and physical.  Briefly, Ms. Kester a 70 year old female who was  recently found to have an incidentally detected left renal mass that was  concerning for renal malignancy.  She underwent a metastatic evaluation  which was negative and after discussing options for treatment, she  elected to proceed with surgical therapy and a robotic assisted  laparoscopic partial nephrectomy.   HOSPITAL COURSE:  On 10/06/08, the patient was taken to the operating  room and underwent the above procedure.  She tolerated this well without  complications.  Postoperatively, she was transferred to a regular  hospital room after recovering from anesthesia and remained  hemodynamically stable.  She was maintained on strict bedrest for  approximately 24 hours.  On the morning of postoperative day #1, her  hemoglobin remained stable 11.9 and her renal function also remained  stable with a serum creatinine of 0.96.  She began ambulating and her  diet was gradually advanced.  She did have some difficulty with nausea  on postoperative day #1 and therefore did not have very much by mouth  that particular day.  She had minimal output from her perinephric drain  and this was checked for a creatinine level and found to be 1.0.  Therefore her drain was removed on postoperative day #2.  She did have  return of bowel function on postoperative day #2 and her oral intake  subsequently improved.  By  postoperative day #3, she again remained  stable and was felt to be stable for discharge.  At that time, she was  also ambulating well.   DISPOSITION:  Home.   DISCHARGE MEDICATIONS:  She was instructed to resume her regular home  medications.  She was provided a prescription to take Vicodin as needed  for pain and Colace as a stool softener.   DISCHARGE INSTRUCTIONS:  She was instructed to be ambulatory but  specifically told to refrain from any heavy lifting, strenuous activity,  or driving.   DIET:  She was instructed to resume her regular diet.   FOLLOW UP:  he will be scheduled for postoperative follow-up in  approximately 3-4 weeks.     Raynelle Bring, MD  Electronically Signed    LB/MEDQ  D:  10/09/2008  T:  10/10/2008  Job:  [id number]

## 2010-07-03 NOTE — H&P (Signed)
NAMEGORDIE, [name]                ACCOUNT NO.:  [id number]   MEDICAL RECORD NO.:  [id number]          PATIENT TYPE:  INP   LOCATION:  1426                         FACILITY:  [facility]   PHYSICIAN:  Marshall Cork. Jeffie Pollock, M.D.    DATE OF BIRTH:  11-Nov-1940   DATE OF ADMISSION:  10/13/2008  DATE OF DISCHARGE:                              HISTORY & PHYSICAL   Patient of Dr. Raynelle Bring.   CHIEF COMPLAINT:  Left flank pain.   HISTORY:  [name] is a 70 year old black female with a left partial  nephrectomy for renal cell carcinoma last week, who had onset yesterday  of shortness of breath and increased left flank pain.  She had no fever  or voiding complaints.  She was seen in the emergency room where CT of  the chest, abdomen and pelvis showed no pulmonary embolus, but she did  have a 3.5 cm non-enhancing fluid collection in the partial nephrectomy  bed with scattered air pockets which was felt to be most like a  postsurgical hematoma versus an early abscess, although no rim  enhancement was noted.  There was some stranding at the trocar sites,  but that is expected post this procedures.  Her pain is a little bit  better this morning, but she still has some shortness of breath.   PAST HISTORY:  1. Asthma.  2. Hypertension.  3. Diabetes.  4. Reflux.  5. Herpes simplex.   SURGICAL HISTORY:  Significant as above.  She has also had an  appendectomy and hysterectomy.   ALLERGIES:  SULFA.   ADMISSION MEDICATIONS:  Acyclovir, Humulin, lisinopril, Nexium, Lipitor,  vitamin E, aspirin and. __________.   FAMILY HISTORY:  Noncontributory.   SOCIAL HISTORY:  Negative for tobacco or alcohol.   REVIEW OF SYSTEMS:  Negative except as above.   PHYSICAL EXAMINATION:  VITAL SIGNS:  Temperature 98.2, pulse 65,  respirations 20, blood pressure is 147/76.  GENERAL:  She is a well-developed, well-nourished black female in no  acute stress.  LUNGS:  Clear to auscultation with reduced effort  secondary to splinting  from pain.  HEART:  Regular rate and rhythm.  ABDOMEN:  Soft, flat with recent trocar sites without erythema or  induration.  There is mild to moderate left upper quadrant and left CVA  tenderness without mass.   LABORATORY DATA:  Troponin 0.01, CK-MB 74.  Urinalysis had 0-2 white and  red cells.  D-dimer was 1.48.  PT 28, PTT 12, INR 0.9.  CBC had a white  count of 7.6, hemoglobin 12.2, hematocrit of 36.8 and a platelet count  of 246.  BMP had a sodium of 139, potassium 46, chloride 104, CO2 of 27,  BUN of 17, creatinine  of 0.9, glucose is 221.  Her CT scan was  reviewed.   IMPRESSION:  Status post left partial nephrectomy with increased pain  and shortness of breath.  There is a fluid collection in the partial  nephrectomy bed with some air, but appears most consistent with  postoperative changes and most likely an abscess, particularly in light  of the  lack of fever or elevated white count.   PLAN:  She was admitted and started on IV Zosyn, but will mainly require  pain management.  Dr. Alinda Money will assess for further intervention.      Marshall Cork. Jeffie Pollock, M.D.  Electronically Signed     JJW/MEDQ  D:  10/13/2008  T:  10/13/2008  Job:  [id number]   cc:   Raynelle Bring, MD  Fax: [phone]

## 2010-07-03 NOTE — Op Note (Signed)
[name], [name]                ACCOUNT NO.:  [id number]   MEDICAL RECORD NO.:  [id number]          PATIENT TYPE:  INP   LOCATION:  0001                         FACILITY:  [facility]   PHYSICIAN:  Raynelle Bring, MD      DATE OF BIRTH:  May 24, 1940   DATE OF PROCEDURE:  10/06/2008  DATE OF DISCHARGE:                               OPERATIVE REPORT   PREOPERATIVE DIAGNOSIS:  Left renal neoplasm.   POSTOPERATIVE DIAGNOSIS:  Left renal neoplasm.   PROCEDURE:  Left robotic-assisted laparoscopic partial nephrectomy.   SURGEON:  Raynelle Bring, M.D.   ASSISTANT:  Franco Collet, nurse practitioner.   ANESTHESIA:  General.   COMPLICATIONS:  None.   ESTIMATED BLOOD LOSS:  50 mL.   SPECIMENS:  1. Left renal tumor margin base.  2. Left renal mass and perinephric fat.   DISPOSITION:  Specimens to pathology.   DRAINS:  #15 Blake perinephric drain.   INTRAOPERATIVE FINDINGS:  1. Warm ischemia time was 21 minutes.  2. Intraoperative tumor margin was negative.   INDICATIONS:  [name] is a 70 year old female who was incidentally  found to have a complex left renal cystic neoplasm concerning for renal  malignancy.  She underwent a metastatic evaluation which was negative.  After a discussion regarding management options for treatment, she  elected to proceed with surgical therapy and a left nephron-sparing  surgery with the above procedure.  The potential risks, complications,  and alternative treatment options were discussed in detail and informed  consent was obtained.   DESCRIPTION OF PROCEDURE:  The patient was taken to the operating room  and a general anesthetic was administered.  She was given preoperative  antibiotics, placed in the left modified flank position, and prepped and  draped in the usual sterile fashion.  Next, a preoperative time-out was  performed.  A site was selected just to the left of the umbilicus for  placement of the camera port.  This was placed using a  standard open  Hasson technique, which allowed entry into the peritoneal cavity under  direct vision and without difficulty.  No intra-abdominal adhesions were  noted and a 12-mm port was then placed.  A pneumoperitoneum was  established and a 0- degree lens was used to inspect the abdomen and  there was no evidence for any intra-abdominal injuries or other  abnormalities.  The remaining ports were then placed.  Eight-mm robotic  ports were placed in the left upper quadrant, left lower quadrant, and  far left lower quadrant.  A 12-mm port was placed in the midline just  superior to the umbilicus for laparoscopic assistance.  All ports were  placed under direct vision and without difficulty.  The surgical cart  was then docked.   With the aid of the cautery scissors, the white line of Toldt was  incised along the length of the descending colon, allowing the colon to  be mobilized medially and the space between the mesocolon and anterior  layer of Gerota fascia to be bluntly developed.  The gonadal vein and  ureter were identified and were then lifted  anteriorly off the psoas  muscle and dissection proceeded superiorly along the gonadal vein up  toward the main renal hilum, where the renal vein was identified.  Just  posterior to the renal vein, the renal artery was identified.  There was  noted to be a single renal artery on preoperative imaging and this was  able to be isolated in preparation for renal hilar clamping.  In  addition, the renal vein was carefully isolated with a combination of  blunt and sharp dissection in preparation for clamping if necessary.  12.5 grams of intravenous mannitol was then administered and attention  turned to mobilization of the kidney and identification of the renal  tumor.   The perinephric fat was incised and the lateral and inferior aspect of  the kidney was able to be freely mobilized down to the renal capsule.  The superior attachments to the  spleen were also sharply taken down,  which allowed the superior pole of the kidney to be mobilized.  The  renal tumor was identified on the lateral aspect of the kidney and the  surrounding perinephric fat was removed from the kidney, keeping the  perinephric fat over the actual tumor.  Once the tumor was adequately  exposed, preparations were made for renal hilar clamping.  A Surgicel  bolster as well as sutures for reconstruction of the renal defect were  preplaced into the abdomen at this point, and a laparoscopic bulldog  clamp was then used to clamp the renal artery.  This appeared to result  in appropriate ischemia of the kidney.  With sharp cold scissor  dissection, the renal tumor was then excised in a wedge-like fashion.  Grossly, the tumor appeared to be excised completely and separate deep  margins of the renal bed were sent for intraoperative analysis and were  found to be negative for tumor.   Attention then turned to reconstruction of the defect.  A 2-0 Vicryl  suture was used to perform a running suture to control any obvious  bleeding arcuate vessels.  There did not appear to be any obvious entry  into the collecting system.  A Surgicel bolster was then placed into the  defect with FloSeal placed underneath the Surgicel.  A double-armed 2-0  Monocryl suture secured with a Hem-o-lok clip was then used to secure  the Surgicel bolster and provide renal compression, and this was secured  to the renal capsule with a combination of Hem-o-lok clips and Lapra-  Ty's.  At this point, the renal artery was unclamped.  Total warm  ischemia time was 21 minutes.  Re-examination of both the renal hilum  and the renal defect revealed excellent hemostasis.  Additional FloSeal  was placed over the defect and the kidney was placed back into its  normal anatomic position.  The perinephric fat was reapproximated over  the exposed portion of the kidney to isolate this section of the kidney   from the surrounding retroperitoneal and peritoneal structures.  A #15  Blake drain was then brought through the fourth arm robotic port and  positioned in the perinephric space.  It was secured to the skin with a  nylon suture.   The surgical cart was then undocked.  The 12-mm port sites were then  prepared for closure.  The renal tumor which had been placed into an  Endopouch retrieval bag was removed via the camera port site intact.  Both 12-mm port sites were then closed with 0 Vicryl sutures which were  placed laparoscopically.  The remaining ports were removed under direct  vision and the pneumoperitoneum was expelled.  The port sites were then  injected with 0.25% Marcaine  and reapproximated at the skin level with 4-0 Monocryl subcuticular  sutures and Dermabond.  The patient appeared to tolerate the procedure  well and without complications.  She was able to be extubated and  transferred to the recovery unit in satisfactory condition.      Raynelle Bring, MD  Electronically Signed     LB/MEDQ  D:  10/06/2008  T:  10/06/2008  Job:  [id number]

## 2010-07-03 NOTE — Discharge Summary (Signed)
[name], [name]                ACCOUNT NO.:  [id number]   MEDICAL RECORD NO.:  [id number]          PATIENT TYPE:  INP   LOCATION:  1426                         FACILITY:  [facility]   PHYSICIAN:  Raynelle Bring, MD      DATE OF BIRTH:  1940/07/10   DATE OF ADMISSION:  10/12/2008  DATE OF DISCHARGE:  10/14/2008                               DISCHARGE SUMMARY   ADMISSION DIAGNOSES:  1. Renal cell carcinoma, status post left partial nephrectomy.  2. Abdominal pain.   DISCHARGE DIAGNOSIS:  1. Renal cell carcinoma, status post left partial nephrectomy.  2. Abdominal pain.   HISTORY AND PHYSICAL:  For full details, please see admission history  and physical.  Briefly, Ms. Ally is a 70 year old female who recently  underwent a left robotic assisted laparoscopic partial nephrectomy  approximately 1 week ago for treatment of a localized renal cell  carcinoma.  She was discharged from hospital on postoperative day #3  after an uneventful hospital course.  She presented to the emergency  department on October 13, 2008 with complaints of severe left-sided flank  pain and some shortness of breath.  She was evaluated in the emergency  department and was found to have an elevated D-dimer.  This prompted a  CT angiogram which did not indicate evidence for a pulmonary embolus.  She underwent a CT scan of the abdomen as well which did demonstrate an  area in the left kidney at her resection site, which did indicate air.  Radiographically, these findings were thought to be possibly consistent  with early abscess formation.  However, on further urologic evaluation,  these findings were consistent with postoperative changes and she did  not clinically appear to have evidence of infection or abscess.  However, she was admitted to the hospital for IV pain control.   Her pain was able to be well-controlled with oral Dilaudid.  In fact,  the first night in the hospital she did not really require any  pain  medication.  She was able to ambulate without difficulty and was able to  tolerate a regular diabetic diet.  She was evaluated the following  morning on October 14, 2008 and appeared to be pain free with a stable  physical exam and no evidence for fever or infection.  She was felt  stable for discharge home.   DISPOSITION:  Home.   DISCHARGE MEDICATIONS:  She was instructed to resume all of her regular  home medications.  She was provided a prescription for oral Dilaudid for  pain control as needed.  She was instructed not to take hydrocodone with  this medication.   DISCHARGE INSTRUCTIONS:  She was instructed to resume a her regular  diabetic diet.  Activity restrictions were as per her last hospital  discharge.   FOLLOW UP:  She will follow up as scheduled for a postoperative  evaluation in the next 2 weeks.      Raynelle Bring, MD  Electronically Signed     LB/MEDQ  D:  10/14/2008  T:  10/14/2008  Job:  [id number]

## 2010-07-04 ENCOUNTER — Ambulatory Visit: Payer: Medicare Other

## 2010-07-06 NOTE — Discharge Summary (Signed)
NAME:  [name], [name]                          ACCOUNT NO.:  [id number]   MEDICAL RECORD NO.:  [id number]                   PATIENT TYPE:  INP   LOCATION:  3729                                 FACILITY:  [facility]   PHYSICIAN:  Eber Hong. Geni Bers, M.D.             DATE OF BIRTH:  12/27/1940   DATE OF ADMISSION:  08/07/2002  DATE OF DISCHARGE:  08/10/2002                                 DISCHARGE SUMMARY   DIAGNOSES:  1. Pneumonia, possibly hospital acquired, presenting several days after     cervical fusion/diskectomy.  2. Type 2 diabetes, insulin requiring.  3. Hypertension.  4. Dyslipidemia.  5. Postmenopausal.  6. Obesity.  7. Cervical myelopathy with improvement after surgery on August 02, 2002 by     Dr. Luiz Ochoa.   ALLERGIES:  SULFA.   DISCHARGE MEDICATIONS:  1. Robitussin 10 mL q.i.d. p.r.n. cough.  2. She is instructed to continue her usual medications at home including the     following:     a. Lisinopril 10 mg daily.     b. Lipitor 10 mg daily.     c. Evista 60 mg.     d. Hydrocodone p.r.n.     e. Flexeril p.r.n.     f. Avandia at her usual dose.     g. Humulin 70/30 forty-five units q.a.m. and 25 units q.p.m.     h. Aciphex 20 mg daily.        a. Paxil 20 mg daily.     i. Aspirin 81 mg daily.   DISCUSSION:  A 70 year old African American female with a productive cough  of three days' duration, fever, tachycardia, pleuritic chest pain, and small  pleural effusions found on chest x-ray and CT scan.  These findings came  three days postoperatively from her diskectomy and cervical fusion surgery  on June 14.  She was admitted and further evaluated.  CT was negative for  acute pulmonary embolism.  Beta natriuretic peptide negative for pulmonary  edema.  Empirically treated with IV Avelox.  Blood cultures obtained and  remained negative.  I think she is a diabetic.  Postoperative day #5, showed  confusion with fever of 101, productive cough with small pleural effusions.  She was treated for potentially hospital-acquired ventilator-associated  pneumonia while awaiting cultures, sputum, and blood to come in.  Zosyn  q.6h. started initially.  She rapidly defervesced, feeling considerably  better, and antibiotic was switched to oral form on the third hospital day.  Pulmonary toilet was improved with the addition of mucolytic medication and  IV fluids and with Xopenex for bronchospasms.  Chest pain gradually came  under better control by the end of hospitalization.  There were no further  problems and having reached an acceptable baseline was discharged on the  fourth hospital day.  Followup was planned with the neurosurgeon and Dr.  Jacelyn [name].  Eber Hong Geni Bers, M.D.    KCS/MEDQ  D:  08/28/2002  T:  08/30/2002  Job:  [id number]   cc:   Dub Mikes P. [name], M.D.  [address]  [place]  [place] [address]  Fax: [phone]   Otilio Connors, M.D.  [address]  [place]  [place] [address]  Fax: [phone]    cc:   Edwyna Shell. [name], M.D.  [address]  [place]  [place] [address]  Fax: [phone]   Otilio Connors, M.D.  [address]  [place]  [place] [address]  Fax: [phone]

## 2010-07-06 NOTE — Op Note (Signed)
NAME:  [name], [name]                          ACCOUNT NO.:  [id number]   MEDICAL RECORD NO.:  [id number]                   PATIENT TYPE:  INP   LOCATION:  3001                                 FACILITY:  [facility]   PHYSICIAN:  Otilio Connors, M.D.               DATE OF BIRTH:  December 04, 1940   DATE OF PROCEDURE:  08/04/2003  DATE OF DISCHARGE:                                 OPERATIVE REPORT   PREOPERATIVE DIAGNOSIS:  Nonunion C6-C7 from prior anterior surgery with  cervical stenosis, spondylosis, and foraminal stenosis.   POSTOPERATIVE DIAGNOSIS:  Nonunion C6-C7 from prior anterior surgery with  cervical stenosis, spondylosis, and foraminal stenosis.   PROCEDURE:  C5 through C7 (two levels) decompressive cervical laminectomy  with left C6-C7 foraminotomy, posterior fusion C5 through C7 with allograft  from the same incision, Summit System lateral mass screws C6 and C7.   SURGEON:  Otilio Connors, M.D.   ASSISTANT:  Marchia Meiers. Vertell Limber, M.D.   ANESTHESIA:  General endotracheal anesthesia.   ESTIMATED BLOOD LOSS:  Minimal, no blood given.   DRAINS:  None.   COMPLICATIONS:  None.   INDICATIONS FOR PROCEDURE:  The patient is a 70 year old woman who underwent  two level ACF a year ago and did extremely well and was very myelopathic and  recovered well and started having numbness radiating down her left arm in  the C7 distribution whenever she would tilt her head or look up and this was  persistent.  MRI and myelograms were done showing nonunion at C6-C7 level  with possible graft collapse and some stenosis at the C6-C7 level including  foraminal stenosis.  The patient is brought in for decompression and fusion.   PROCEDURE IN DETAIL:  The patient was brought to the operating room, general  anesthesia was induced, the patient was placed in head pins and placed in  the prone position with all pressure points padded.  The patient was prepped  and draped in the usual sterile fashion.  The  site of the incision was  injected with 10 mL of 1% lidocaine with epinephrine.  An incision was made  in the midline of the cervical spine.  The incision was taken down to the  fascia and hemostasis obtained with Bovie cautery.  The fascia was incised  and subperiosteal dissection was done on the left side from the spinous  processes and lamina out to the facets.  We used fluoroscopic images to  confirm our positioning and we exposed the C5, C6, and C7 levels bilaterally  with subperiosteal dissection out to the lateral facets.  Again, we  confirmed position with fluoroscopic imaging, also with the anterior fusion  at C5, C6, and C7.  These areas were not mobile compared to the rest of the  spine.  A decompressive laminectomy was then performed using Leksell  rongeurs and Kerrison punches removing the inferior lamina and spinous  processes  of C5, all of C6, and superior aspect of C7.  We decompressed the  central canal in this way and then an extensive foraminotomy removing all  facet was done over the C7 root on the left side with Kerrison punches and  high speed drill.  When we were finished, she had good dural decompression.  Attention was taken to lateral masses and facet joints.  A high speed drill  was used to decorticate the lateral masses.  We drilled into the facet  joints 5-6 and had a good start of a fusion on C6-C7 but was not completely  fused.  We decorticated bilaterally.  We then used an awl to find the entry  point for our lateral mass screws and started the hole on the right side at  C6 and C7.  We used a drill to drill the lateral mass and tapped the lateral  mass and placed the screw.  A 12 mm Summit screw was placed in C6 with a 12  mm angled screw placed in C7.  This process was repeated on the left side.  Locking nuts were placed and final tightened.  This gave [place] good  stabilization at the 6-7 level.  All the bone removed during laminectomy was  cleaned of soft  tissues, chopped up into small pieces, and placed in  posterolateral gutters bilaterally for posterolateral fusion from C5 to C7.  A small piece of Gelfoam was placed over the C7 nerve root on the left side  to protect it from any bone graft.  Retractors were were removed.  Hemostasis was obtained with Bovie and bipolar cauterization.  The  paraspinous muscles and the fascia were closed with 0 Vicryl interrupted  sutures, the subcutaneous tissue closed with 0, 2-0 and 3-0 Vicryl  interrupted sutures, and the skin was closed Dermabond skin glue.  When that  was dry, a dressing was placed.  The patient was placed back in the supine  position, had the head pins removed, placed in a cervical collar, awakened  from anesthesia and transferred to the recovery room in stable condition.                                               Otilio Connors, M.D.    JRH/MEDQ  D:  08/04/2003  T:  08/04/2003  Job:  [id number]

## 2010-07-06 NOTE — Cardiovascular Report (Signed)
[name], [name] NO.:  [id number]   MEDICAL RECORD NO.:  [id number]          PATIENT TYPE:  OIB   LOCATION:  2899                         FACILITY:  [facility]   PHYSICIAN:  Shelva Majestic, M.D.     DATE OF BIRTH:  December 19, 1940   DATE OF PROCEDURE:  DATE OF DISCHARGE:                            CARDIAC CATHETERIZATION   INDICATIONS:  Mrs.  [name] is a 70 year old African American  female who was admitted to [facility] with a history of chest  pain associated with shortness of breath.  She has a history of diabetes  mellitus, hyperlipidemia.  She was seen in consultation by Dr. Mathis Bud.  With subtle ECG changes and risk factors, definitive catheterization was  recommended.   PROCEDURE:  After premedication with Valium 5 mg intravenously, the  patient was prepped and draped in usual fashion.  Her right femoral  artery was punctured anteriorly and a 5-French sheath was inserted.  Diagnostic catheterization was done utilizing 5-French Judkins for a  left coronary as well as ultimately a no torque right 5-French catheter.  Intracoronary nitroglycerin 200 mcg was administered down the right  coronary artery due to an initial angiographic evidence for mild spasm  of the mid RCA.  A 5-French pigtail catheter was used for biplane  selective artery.  Distal aortography was also performed in light of her  cardiac risk factors.  She tolerated procedure well returned to room in  satisfactory condition.   HEMODYNAMIC DATA:  Central aorta.  Aortic pressure was 135/69.  Left  ventricular pressure was 135/5.   ANGIOGRAPHIC DATA:  Left main coronary artery was angiographically  normal and bifurcated into an LAD and left circumflex system.   The LAD was angiographically normal and gave rise to three prominent  diagonal vessels, several proximal septal perforating arteries, and  wrapped around the LV apex to supply the distal third of the inferior  wall.   The  circumflex vessel was angiographically normal and trifurcated  proximally into two marginal vessels and the AV groove circumflex  vessel.  Several posterolateral branches arose from the distal  circumflex vessel.   The right coronary artery was free of obstructive disease and supplied a  PDA and a very small posterolateral system.  After anterior RV marginal  branch, there was mild 20% smooth narrowing in the mid portion of the  vessel.  Following 200 mcg of intracoronary nitroglycerin, there was  complete resolution of this area, suggesting mild spasm.   Biplane selective artery revealed normal LV contractility without focal  segmental wall motion abnormalities.  Ejection fraction was 60%.   Distal aortography revealed a normal aortoiliac system.  There is no  evidence for renal artery stenosis.   IMPRESSION:  1. Normal LV function with an ejection fraction of 60%.  2. Essentially normal coronary arteries with evidence for mild mid RCA      spasm of 20% which resolved following IC nitroglycerin      administration.  3. Normal aortoiliac system.           ______________________________  Shelva Majestic, M.D.  TK/MEDQ  D:  03/12/2006  T:  03/12/2006  Job:  [id number]   cc:   Hind Franco Collet, MD  Leslye Peer, MD

## 2010-07-06 NOTE — Discharge Summary (Signed)
NAME:  [name], [name]                          ACCOUNT NO.:  [id number]   MEDICAL RECORD NO.:  [id number]                   PATIENT TYPE:  INP   LOCATION:  [id number]                                 FACILITY:  [facility]   PHYSICIAN:  Otilio Connors, M.D.               DATE OF BIRTH:  1940/07/25   DATE OF ADMISSION:  08/04/2003  DATE OF DISCHARGE:  08/08/2003                                 DISCHARGE SUMMARY   DIAGNOSIS:  Cervical stenosis, spondylosis, and nonunion for a fusion C6-7.   DISCHARGE DIAGNOSIS:  Cervical stenosis, spondylosis, and nonunion for a  fusion C6-7.   PROCEDURE:  C5 through C7 decompression laminectomy with a left C6  foraminotomy with some nonsegmented lateral mass instrumentation, posterior  fusion C5 through C7 with autograft.   REASON FOR ADMISSION:  The patient is a 70 year old woman who went through  anterior disk surgery and had done well for a period of time but developed  neck pain radiating down her left arm with C7 distribution whenever she  would tilt her head to that side.  MRI and myelogram were done showing  nonunion at C6-7, possible graft collapse and some stenosis at C6-7 level  suggestive of _____________ stenosis.  The patient was brought in for a  decompression and fusion posteriorly.   HOSPITAL COURSE:  The patient was admitted the day of surgery and underwent  the procedure above without complications.  Postop the patient was  transferred to the recovery room and then to the floor.  On the evening of  postop, she had no arm pain with sensation intact.  The following couple of  days the incision was clean, dry, intact.  She was moving all extremities,  but was a little slow to progress and ambulate.  Still had some tingling in  the hands with movement and moving around but nothing worse.  Incision  clean, dry, intact.  Still with some encouragement, she did start ambulating  a little bit more and was discharged home on August 08, 2003 in stable  condition.   DISCHARGE MEDICATIONS:  Same as pre-hospitalization plus some pain meds and  muscle relaxants.   FOLLOW UP:  With me in a few weeks in my office.   DIET:  As tolerated.   Keep C-collar on.                                                Otilio Connors, M.D.    JRH/MEDQ  D:  10/13/2003  T:  10/14/2003  Job:  [id number]

## 2010-07-06 NOTE — H&P (Signed)
NAME:  [name], [name]                ACCOUNT NO.:  [id number]   MEDICAL RECORD NO.:  [id number]          PATIENT TYPE:  EMS   LOCATION:  ED                           FACILITY:  [facility]   PHYSICIAN:  Hind I Laurie Panda, MD      DATE OF BIRTH:  12/09/1940   DATE OF ADMISSION:  03/10/2006  DATE OF DISCHARGE:                              HISTORY & PHYSICAL   PRIMARY CARE PHYSICIAN:  Harden Mo, M.D.   CHIEF COMPLAINT:  Chest pain for one week with shortness of breath,  which is chronic.  Nausea for one day.   HISTORY OF PRESENT ILLNESS:  A 70 year old African-American female with  a past medical history of diabetes mellitus, on insulin, hypertension,  hypercholesterolemia, and osteoporosis.  Patient presented to [facility] today with a chief complaint of chest pain which started for one  week, mainly retrosternal chest pain.  Nonradiating.  Chest pain mainly  like tightness on the chest.  The scan of pain almost 8/10.  The patient  is off and on, mainly on mild exertion, and relieved by rest but  sometimes chest pain comes while the patient is at rest.  Condition  associated with shortness of breath on mild exertion.  No paroxysmal  nocturnal dyspnea.  No lower extremity swelling.  The patient described  pain of substernal chest pain.  Nonradiating.  No change with movement  and breathing.  The condition sometimes associated with palpitations,  but there is no sweating.  Condition today progressively worsening.  The  patient developed nausea but no vomiting.  No abdominal pain.  She  noticed some soft stool which is x1.  Patient denies any headache.  Denies any abdominal pain.  Denies any vomiting.  Had loose stool today  x1, which is brown in color.  No evidence of blood in the stool.   PAST MEDICAL HISTORY:  1. History of diabetes mellitus on insulin N.  2. High blood pressure.  3. Hypercholesterolemia.  4. Asthma.  5. History of cervical stenosis, spondylosis, and nonunion of  C5-6 and      C6-7, status post surgery.   MEDICATIONS:  1. AcipHex, dose unknown.  2. Lisinopril, dose unknown.  3. Evista, dose unknown.  4. Lipitor, dose unknown.  5. Insulin 70/30, 4 units subcu q.a.m. and 30 units subcu q.p.m.  6. Aspirin 81 mg p.o.   ALLERGIES:  ZOFRAN.   PAST SURGICAL HISTORY:  History of cervical laminectomy, done by Dr.  Hazle Coca and C6-7 foraminotomy, done in 2005.  History of  submandibular lipoma, which was removed.  Patient admitted she has a  history of a stress test done two years ago at [facility], where the patient's primary care has referred her to  [facility].  According to her, the stress test was negative.   SYSTEMIC REVIEW:  Patient has a positive history of cough, which is  whitish off and on, positive for nausea, positive for loose stools x1.  No headache.  No numbness on the lower extremities, but she has  complained of chronic upper  extremity numbness bilaterally where patient  underwent recent steroid injection to the cervical spine for possibility  of cervical spondylosis.   FAMILY HISTORY:  Mother died with a stroke, and father, as she  remembered had a history of atherosclerosis.   SOCIAL HISTORY:  No history of smoking.  No history of alcohol abuse.  No history of drug abuse.  She lived by herself, had a daughter, and her  husband deceased.  She worked as an Investment banker, operational.   PHYSICAL EXAMINATION:  GENERAL:  Patient lying comfortably on bed.  Not  in respiratory distress or shortness of breath.  VITAL SIGNS ON ADMISSION:  Temperature 97, blood pressure 138/83, pulse  rate 64, respiratory rate 18, pulse oximetry 96% on room air.  HEENT:  Well-developed and well-nourished.  Not in respiratory distress  or shortness of breath.  Eyes:  Normal appearance.  Extraocular muscles  movements are normal.  Pupils are equal, round and reactive to light and  accommodation.  ENT, mouth, and pharynx  within normal.  NECK:  Supple.  Full range of motion.  No JVD.  No thyromegaly.  No  lymph nodes.  CARDIOVASCULAR:  Regular rate and rhythm.  No added sounds.  RESPIRATORY:  Normal breaths bilaterally.  No rales.  No rhonchi,  wheezing, or rub.  CHEST:  Movement normal.  BREASTS:  No masses.  ABDOMEN:  Soft and nontender.  Bowel sounds positive.  EXTREMITIES:  No deformity.  Full range of motion.  NEUROLOGIC:  Patient alert and oriented x3.  Normal speech.  No facial  droop.  No focal neurological findings.  SKIN:  Color was normal, warm, and dry.   LABORATORY FINDINGS:  EKG:  Normal sinus rhythm.   Sodium 133, potassium 4.1, chloride 100, bicarb 27, BUN 18, creatinine  0.9, blood sugar 244.  White blood cells 18, hemoglobin 14.6, platelets  257.   Do not have a chest x-ray at this time.   CK-MB and troponin, CK-MB 1, myoglobin 63, troponin less than 0.05.   ASSESSMENT/PLAN:  1. Chest pain with shortness of breath for one week, rule out unstable      angina, myocardial infarction, and will order a 2D echo, CK-MB,      troponin.  Serial EKGs, D-dimer and CT of the chest with IV      contrast to rule out pulmonary embolism.  Start the patient on      aspirin, Lipitor, beta blocker, ACE inhibitor.  Will consult      cardiology if needed.  If all the above negative, patient can be      discharged home and followed up with [facility] for another      stress test.  2. Diabetes mellitus:  Will get hemoglobin A1C and microalbuminuria.      Will continue with NovoLog 70/30 as home medication with NovoLog      sliding scale.  3. Hyperlipidemia:  Will continue with Lipitor.  4. Cervical stenosis and spondylosis:  Will continue with pain      medication.  5. Nausea, at which time still cause is unclear. Will keep close      observation on diet and supportive measurements.  6. Osteoporosis:  Patient used Evista.  Will hold Evista for now as     the risks of deep venous thrombosis is  increased.  7. Deep venous thrombosis and gastrointestinal prophylaxis.      Hind I Laurie Panda, MD  Electronically Signed     HIE/MEDQ  D:  03/10/2006  T:  03/10/2006  Job:  [id number]

## 2010-07-06 NOTE — Op Note (Signed)
   NAME:  [name], [name]                          ACCOUNT NO.:  [id number]   MEDICAL RECORD NO.:  [id number]                   PATIENT TYPE:  AMB   LOCATION:  ENDO                                 FACILITY:  [facility]   PHYSICIAN:  James L. Rolla Flatten., M.D.          DATE OF BIRTH:  [birth year]   DATE OF PROCEDURE:  09/24/2001  DATE OF DISCHARGE:                                 OPERATIVE REPORT   PROCEDURE PERFORMED:  Colonoscopy   MEDICATIONS:  Fentanyl 70 mcg, Versed 7 mg IV   ENDOSCOPE:  Pediatric Olympus video colonoscope   INDICATIONS FOR PROCEDURE:  Follow up for previous colon polyps.   DESCRIPTION OF PROCEDURE:  Procedure had been explained to the patient and  consent obtained.  The patient in the left lateral decubitus position, the  pediatric colonoscope was inserted, advanced under direct visualization.  The prep was excellent.  We were able to reach the cecum without difficulty.  The appendiceal and ileocecal valve were seen.  The scope was withdrawn and  with colon carefully examined on withdraw, no polyps or other lesions were  seen.  Internal hemorrhoids were seen in the rectum upon removal of the  scope.  These were really very small.  The scope was withdrawn.  The patient  tolerated the procedure well.   ASSESSMENT:  No evidence of further colon polyps.   PLAN:  Will recommend repeating procedure in five years.  We will see her  back in the office as-needed.                                                James L. Rolla Flatten., M.D.    Jaynie Bream  D:  09/24/2001  T:  09/27/2001  Job:  [id number]   cc:   Dub Mikes P. Jacelyn Grip, M.D.

## 2010-07-06 NOTE — Discharge Summary (Signed)
[name], [name] NO.:  [id number]   MEDICAL RECORD NO.:  [id number]          PATIENT TYPE:  EMS   LOCATION:  ED                           FACILITY:  [facility]   PHYSICIAN:  Shelva Majestic, M.D.     DATE OF BIRTH:  1940/06/25   DATE OF ADMISSION:  05/15/2007  DATE OF DISCHARGE:  05/17/2007                               DISCHARGE SUMMARY   [name]. Cacciatore is a 70 year old African American female with a  prior history of diabetes, hypertension, dyslipidemia, and  nonobstructive CAD by cath on January 2008.  She apparently went to  [facility] emergency room with complaints of chest pain at 3 a.m., left  anterior chest below the left breast radiating into her back.  She  describes it as sharp and shooting pain.  She was admitted to rule out  an MI, and for further cardiac evaluation.  She apparently also was  having itchy eye symptoms.  She was treated with Cipro upon admission.  Her CK-MBs and troponins came back negative.  She had an ultrasound,  infrarenal, which showed questionable __________cyst lateral left  kidney.  She apparently had no further chest discomfort.  She was  changed from IV Cipro to oral, and then on May 17, 2007, she was seen  by Dr. Ida Rogue, who considered her stable for discharge home for  follow up as an outpatient with Dr. Shelva Majestic, her primary  cardiologist.   LABORATORY DATA:  Sodium 144, potassium 4.4, chloride 110, CO2 of 29,  glucose 136, BUN 12, creatinine 1.08.  CBC; hemoglobin 12.5, hematocrit  35.7, WBCs 5.7 and platelets were 211.  Lipid profile; total cholesterol  126, triglycerides 83, HDL 35, LDL 74.  CK-MBs and troponins were all  negative.   DISCHARGE MEDICATIONS:  1. Aspirin 81 mg 2 per day.  2. NovoLog insulin 50 units every morning.  3. Lipitor 10 mg everyday.  4. Lisinopril 20 mg everyday.  5. Metoprolol 25 mg twice a day.  6. Naprosyn 375 mg twice per day x5 more days.  7. Cipro 250 mg twice a day  x3 more days.  8. Aciphex 20 mg a day.  9. Nitroglycerin 1 under her tongue every 5 minutes x3 as needed for      chest pain.   DISCHARGE DIAGNOSES:  1. Chest pain, not felt to be cardiac ischemic related, felt to be      musculoskeletal with negative CK-MBs.  Last catheterization on      January 2008, showing only a 20% right coronary artery lesion and      symptoms more typical of musculoskeletal.  2. Urinary tract infection, treated with Cipro.  3. Hypertension.  4. Dyslipidemia.  5. Insulin-dependent diabetes mellitus.      Cyndia Bent, N.P.    ______________________________  Shelva Majestic, M.D.    BB/MEDQ  D:  09/02/2007  T:  09/02/2007  Job:  [id number]

## 2010-07-06 NOTE — Op Note (Signed)
NAME:  [name], [name]                          ACCOUNT NO.:  [id number]   MEDICAL RECORD NO.:  [id number]                   PATIENT TYPE:  INP   LOCATION:  3172                                 FACILITY:  [place]   PHYSICIAN:  Otilio Connors, M.D.               DATE OF BIRTH:  May 11, 1940   DATE OF PROCEDURE:  08/02/2002  DATE OF DISCHARGE:                                 OPERATIVE REPORT   PREOPERATIVE DIAGNOSIS:  Spondylosis and herniated nucleus pulposus with  myelopathy at C5-6 and C6-7.   POSTOPERATIVE DIAGNOSIS:  Spondylosis and herniated nucleus pulposus with  myelopathy at C5-6 and C6-7.   PROCEDURE:  Anterior cervical diskectomy and decompression and fusion at C5-  6 and C6-7, with __________ allograft bone and Tether anterior cervical  plate.   SURGEON:  Otilio Connors, M.D.   ASSISTANT:  Ophelia Charter, M.D.   General endotracheal tube anesthesia.   ESTIMATED BLOOD LOSS:  Minimal.   BLOOD GIVEN:  None.   DRAINS:  None.   COMPLICATIONS:  None.   REASON FOR PROCEDURE:  The patient is a 70 year old woman who has been  having left-sided neck pain with pain radiating to the arm with numbness in  her fingers, mainly the middle fingers.  She has no right-sided symptoms and  has noted some sloppiness in her hands.  She is a Art therapist and plays  piano, and she has been noticing a change there that has been slowly  worsening, also some mild gait instability, but slowly worsening.  MRI was  done showing spondylosis, stenosis, and cord compression at 5-6 and 6-7,  worse at 5-6.  The patient brought in for decompression and fusion.   PROCEDURE IN DETAIL:  The patient was brought in the operating room and  general anesthesia induced.  The patient was placed in 10 pounds of Holter  traction, prepped and draped in a sterile fashion.  The site of incision was  injected with 10 mL 1% lidocaine with epinephrine.  Incision was then made  from the midline to the anterior  border of the sternocleidomastoid muscle  and the left-sided neck incision taken down to the platysma and hemostasis  obtained with Bovie cauterization.  The platysma was incised with the Bovie  and blunt dissection was taken through the anterior cervical fascia to the  anterior cervical spine.  A needle was placed in the interspace.  An x-ray  was obtained showing this was the 5-6 interspace.  The interspace was then  incised with a 15 blade and partial diskectomy performed with pituitary  rongeurs.  The longus colli muscle was then retracted laterally from C5, C6,  and C7 on both sides, and the self-retaining retractor was placed.  The disk  space at 5-6 and 6-7 was then incised with a 15 blade and a diskectomy was  performed with pituitary rongeurs and curettes.  Distractions were  placed in  C5 and C7 and the interspaces distracted.  Diskectomy was then continued  using the curettes, pituitary rongeurs, and 1 and 2 mm Kerrison punches.  The posterior longitudinal ligament and posterior osteophytes all removed,  bilateral foraminotomies performed.  When we were finished, we had good  decompression of the central canal and bilateral foramen at both 5-6 and 6-  7.  A high-speed drill was used to remove the cartilaginous end plates at  both levels and the interspaces were then measured with the __________  instrumentation, and 6 mm guide fit into both levels well.  The broach for 6  mm was then used to roughen up the end plates and two 6 mm __________  allograft bones were then tapped into place and counter sunk about a  millimeter both at 5-6 and at 6-7.  We checked behind the bone grafts with a  hook, and there was plenty of room between dura and posterior edge of the  bone graft.  The wound was irrigated with antibiotic solution, distraction  pins were removed, and hemostasis was obtained with Gelfoam and thrombin.  The Tether anterior cervical plate was then placed over the anterior   cervical spine with two screws placed in the C5, one in C6, and two at C7.  X-rays obtained showing good position of plate and screws and interbody  grafts.  The wound was irrigated with antibiotic solution and retractors  were removed.  Hemostasis was obtained with bipolar cauterization and  Gelfoam and thrombin, Gelfoam was irrigated out, and the platysma was then  closed with 3-0 Vicryl interrupted suture, the subcutaneous tissue was  closed the same, and the skin closed with Dermabond skin glue.  A dry  dressing was placed.  The patient was placed in a cervical collar, awoken  from anesthesia, and transferred to the recovery room.                                                Otilio Connors, M.D.    JRH/MEDQ  D:  08/02/2002  T:  08/02/2002  Job:  [id number]

## 2010-07-06 NOTE — Discharge Summary (Signed)
NAMEZAHRA, [name]                ACCOUNT NO.:  [id number]   MEDICAL RECORD NO.:  [id number]          PATIENT TYPE:  INP   LOCATION:  1430                         [facility]AC[facility]   PHYSICIAN:  Cyril Mourning, D.O.    DATE OF BIRTH:  July 22, 1940   DATE OF ADMISSION:  03/10/2006  DATE OF DISCHARGE:  03/13/2006                               DISCHARGE SUMMARY   PRIMARY CARE PHYSICIAN:  Philipp Deputy, M.D.   GASTROENTEROLOGIST:  [facility]   FINAL DIAGNOSIS:  1. Noncardiac chest pain, resolved.  2. Suboptimally controlled diabetes with a hemoglobin A1c of 7.9.  3. Hypertension.  4. Asthma.   MEDICATIONS ON DISCHARGE:  The patient was instructed to continue her  AcipHex, lisinopril, Evista, Lipitor, insulin 70/30, aspirin 81 mg  daily, Reglan additionally 5 mg three times a day before meals.  Unfortunately, [name] could not produce a list of her medications  from home and we could not reach her primary care physician to obtain  these medications.  It is strongly recommended that she keep a written  list of her medications and dosages, at all times.  This is conveyed to  her primary care physician in the form of a copy of this summary to Dr.  Jake Michaelis.   LABORATORY DATA:  At the time of discharge WBC 6.1, hemoglobin of 13.2,  platelet count 213.  Sodium 143, potassium 4.3, BUN 13, creatinine 0.9,  and glucose 196, hemoglobin A1c of 7.9 as stated above.   CONSULTANTS THIS ADMISSION:  Dr. Shelva Majestic status post cardiac  catheterization which revealed an ejection fraction of 60%; no  significant coronary disease was noted.   DISPOSITION:  [name] was discharged medically stable and in improved  condition, though it was evident that her outpatient control of her  diabetes was suboptimal.  She was instructed to followup closely with  primary care physician to achieve much better control.   HISTORY OF PRESENTING ILLNESS:  For full details please refer to the H&P  as dictated by Dr. Laurie Panda.  However briefly, [name] is a 70-year-  old female with a past medical history significant for diabetes,  hypertension, and hypercholesterolemia.  Presented to the [facility]  Emergency Department with a chief complaint of chest pain.  She stated  that this was associated with shortness of breath and mild exertion.  In  any event, due to her risk factors she was admitted for further  evaluation and management.   HOSPITAL COURSE:  Cardiology was consulted.  She was cycled as well for  acute ischemia.  Her cardiac markers were negative.  An echocardiogram,  as well, revealed a preserved LV function.  She was seen by Dr. Claiborne Billings  who  ultimately performed cardiac catheterization without the findings of  obstructive coronary disease.  Her chest pain resolved towards the end  of her hospitalization.  She was felt to be medically suitable and  stable for further outpatient workup for noncardiac chest pain.      Cyril Mourning, D.O.  Electronically Signed     ESS/MEDQ  D:  08/06/2006  T:  08/07/2006  Job:  [id number]   cc:   Shelva Majestic, M.D.  Fax: [phone]

## 2010-07-09 ENCOUNTER — Ambulatory Visit: Payer: Medicare Other | Admitting: Physical Therapy

## 2010-07-11 ENCOUNTER — Ambulatory Visit: Payer: Medicare Other | Admitting: Physical Therapy

## 2010-07-19 ENCOUNTER — Emergency Department (INDEPENDENT_AMBULATORY_CARE_PROVIDER_SITE_OTHER): Payer: Medicare Other

## 2010-07-19 ENCOUNTER — Emergency Department (HOSPITAL_BASED_OUTPATIENT_CLINIC_OR_DEPARTMENT_OTHER)
Admission: EM | Admit: 2010-07-19 | Discharge: 2010-07-19 | Disposition: A | Discharge status: Home / Self Care | Payer: Medicare Other | Attending: Emergency Medicine | Admitting: Emergency Medicine

## 2010-07-19 DIAGNOSIS — J45909 Unspecified asthma, uncomplicated: Secondary | ICD-10-CM | POA: Insufficient documentation

## 2010-07-19 DIAGNOSIS — R059 Cough, unspecified: Secondary | ICD-10-CM

## 2010-07-19 DIAGNOSIS — I10 Essential (primary) hypertension: Secondary | ICD-10-CM

## 2010-07-19 DIAGNOSIS — R0602 Shortness of breath: Secondary | ICD-10-CM

## 2010-07-19 DIAGNOSIS — E119 Type 2 diabetes mellitus without complications: Secondary | ICD-10-CM | POA: Insufficient documentation

## 2010-07-19 DIAGNOSIS — Z79899 Other long term (current) drug therapy: Secondary | ICD-10-CM | POA: Insufficient documentation

## 2010-07-19 DIAGNOSIS — R05 Cough: Secondary | ICD-10-CM

## 2010-07-19 DIAGNOSIS — R0989 Other specified symptoms and signs involving the circulatory and respiratory systems: Secondary | ICD-10-CM

## 2010-07-19 DIAGNOSIS — J40 Bronchitis, not specified as acute or chronic: Secondary | ICD-10-CM | POA: Insufficient documentation

## 2010-09-23 ENCOUNTER — Encounter: Payer: Self-pay | Admitting: Emergency Medicine

## 2010-09-23 ENCOUNTER — Emergency Department (HOSPITAL_BASED_OUTPATIENT_CLINIC_OR_DEPARTMENT_OTHER)
Admission: EM | Admit: 2010-09-23 | Discharge: 2010-09-23 | Disposition: A | Discharge status: Home / Self Care | Payer: Medicare Other | Attending: Emergency Medicine | Admitting: Emergency Medicine

## 2010-09-23 DIAGNOSIS — R109 Unspecified abdominal pain: Secondary | ICD-10-CM

## 2010-09-23 DIAGNOSIS — I10 Essential (primary) hypertension: Secondary | ICD-10-CM | POA: Insufficient documentation

## 2010-09-23 DIAGNOSIS — E119 Type 2 diabetes mellitus without complications: Secondary | ICD-10-CM | POA: Insufficient documentation

## 2010-09-23 HISTORY — DX: Chronic kidney disease, unspecified: N18.9

## 2010-09-23 HISTORY — DX: Acute kidney failure, unspecified: N17.9

## 2010-09-23 HISTORY — DX: Essential (primary) hypertension: I10

## 2010-09-23 LAB — CBC
HCT: 39.8 % (ref 36.0–46.0)
MCHC: 33.4 g/dL (ref 30.0–36.0)
MCV: 91.3 fL (ref 78.0–100.0)
RDW: 13.8 % (ref 11.5–15.5)

## 2010-09-23 LAB — URINALYSIS, ROUTINE W REFLEX MICROSCOPIC
Bilirubin Urine: NEGATIVE
Hgb urine dipstick: NEGATIVE
Ketones, ur: NEGATIVE mg/dL
Nitrite: NEGATIVE
Specific Gravity, Urine: 1.008 (ref 1.005–1.030)
Urobilinogen, UA: 0.2 mg/dL (ref 0.0–1.0)
pH: 5.5 (ref 5.0–8.0)

## 2010-09-23 LAB — DIFFERENTIAL
Basophils Absolute: 0 10*3/uL (ref 0.0–0.1)
Basophils Relative: 0 % (ref 0–1)
Eosinophils Relative: 3 % (ref 0–5)
Monocytes Absolute: 0.6 10*3/uL (ref 0.1–1.0)

## 2010-09-23 LAB — BASIC METABOLIC PANEL
BUN: 16 mg/dL (ref 6–23)
CO2: 25 mEq/L (ref 19–32)
Calcium: 9.8 mg/dL (ref 8.4–10.5)
Creatinine, Ser: 1.2 mg/dL — ABNORMAL HIGH (ref 0.50–1.10)

## 2010-09-23 MED ORDER — OXYCODONE-ACETAMINOPHEN 5-325 MG PO TABS: 1.0000 | ORAL_TABLET | ORAL | Status: AC | PRN

## 2010-09-23 NOTE — ED Provider Notes (Signed)
History     CSN: [id number] Arrival date & time: 09/23/2010  6:35 PM  Chief Complaint  Patient presents with  . Flank Pain    Pt report UTI Symptoms with flank pain    Patient is a 70 y.o. female presenting with flank pain. The history is provided by the patient.  Flank Pain This is a recurrent problem. The current episode started yesterday. Pertinent negatives include no chest pain, no abdominal pain, no headaches and no shortness of breath. The symptoms are aggravated by nothing. The symptoms are relieved by nothing.  patient has been having episodes of right flank pain. She states that she has also been having urinary frequency. She states that she has a history of kidney infections. She states that she had her left kidney out because of cancer and was told that her other kidney is failing. No fevers. Mild nausea without vomiting. Mild diarrhea.   Past Medical History  Diagnosis Date  . Diabetes mellitus   . Hypertension     Past Surgical History  Procedure Date  . Renal mass excision     No family history on file.  History  Substance Use Topics  . Smoking status: Never Smoker   . Smokeless tobacco: Not on file  . Alcohol Use: No    OB History    Grav Para Term Preterm Abortions TAB SAB Ect Mult Living                  Review of Systems  Constitutional: Negative for fever, chills, activity change and appetite change.  HENT: Negative for ear pain and neck stiffness.   Eyes: Negative for pain.  Respiratory: Negative for chest tightness and shortness of breath.   Cardiovascular: Negative for chest pain and leg swelling.  Gastrointestinal: Positive for nausea. Negative for abdominal pain, diarrhea and abdominal distention.  Genitourinary: Positive for dysuria, urgency, frequency and flank pain.  Musculoskeletal: Negative for back pain.  Skin: Negative for rash.  Neurological: Negative for weakness, numbness and headaches.  Psychiatric/Behavioral: Negative for  behavioral problems.    Physical Exam  BP 126/65  Pulse 88  Temp(Src) 98.5 F (36.9 C) (Oral)  Resp 24  SpO2 99%  Physical Exam  Nursing note and vitals reviewed. Constitutional: She is oriented to person, place, and time. She appears well-developed and well-nourished.  HENT:  Head: Normocephalic and atraumatic.  Eyes: EOM are normal. Pupils are equal, round, and reactive to light.  Neck: Normal range of motion. Neck supple.  Cardiovascular: Normal rate, regular rhythm and normal heart sounds.   No murmur heard. Pulmonary/Chest: Effort normal and breath sounds normal. No respiratory distress. She has no wheezes. She has no rales.  Abdominal: Soft. Bowel sounds are normal. She exhibits no distension. There is no tenderness. There is no rebound and no guarding.  Genitourinary:       CVA tenderness on right.   Musculoskeletal: Normal range of motion.  Neurological: She is alert and oriented to person, place, and time. No cranial nerve deficit.  Skin: Skin is warm and dry.  Psychiatric: Her speech is normal.    ED Course  Procedures  Results for orders placed during the hospital encounter of 09/23/10  URINALYSIS, ROUTINE W REFLEX MICROSCOPIC      Component Value Range   Color, Urine YELLOW  YELLOW    Appearance CLEAR  CLEAR    Specific Gravity, Urine 1.008  1.005 - 1.030    pH 5.5  5.0 - 8.0  Glucose, UA 100 (*) NEGATIVE (mg/dL)   Hgb urine dipstick NEGATIVE  NEGATIVE    Bilirubin Urine NEGATIVE  NEGATIVE    Ketones, ur NEGATIVE  NEGATIVE (mg/dL)   Protein, ur NEGATIVE  NEGATIVE (mg/dL)   Urobilinogen, UA 0.2  0.0 - 1.0 (mg/dL)   Nitrite NEGATIVE  NEGATIVE    Leukocytes, UA NEGATIVE  NEGATIVE   CBC      Component Value Range   WBC 6.0  4.0 - 10.5 (K/uL)   RBC 4.36  3.87 - 5.11 (MIL/uL)   Hemoglobin 13.3  12.0 - 15.0 (g/dL)   HCT 39.8  36.0 - 46.0 (%)   MCV 91.3  78.0 - 100.0 (fL)   MCH 30.5  26.0 - 34.0 (pg)   MCHC 33.4  30.0 - 36.0 (g/dL)   RDW 13.8  11.5 -  15.5 (%)   Platelets 232  150 - 400 (K/uL)  DIFFERENTIAL      Component Value Range   Neutrophils Relative 40 (*) 43 - 77 (%)   Neutro Abs 2.4  1.7 - 7.7 (K/uL)   Lymphocytes Relative 48 (*) 12 - 46 (%)   Lymphs Abs 2.8  0.7 - 4.0 (K/uL)   Monocytes Relative 10  3 - 12 (%)   Monocytes Absolute 0.6  0.1 - 1.0 (K/uL)   Eosinophils Relative 3  0 - 5 (%)   Eosinophils Absolute 0.2  0.0 - 0.7 (K/uL)   Basophils Relative 0  0 - 1 (%)   Basophils Absolute 0.0  0.0 - 0.1 (K/uL)  BASIC METABOLIC PANEL      Component Value Range   Sodium 140  135 - 145 (mEq/L)   Potassium 3.9  3.5 - 5.1 (mEq/L)   Chloride 105  96 - 112 (mEq/L)   CO2 25  19 - 32 (mEq/L)   Glucose, Bld 110 (*) 70 - 99 (mg/dL)   BUN 16  6 - 23 (mg/dL)   Creatinine, Ser 1.20 (*) 0.50 - 1.10 (mg/dL)   Calcium 9.8  8.4 - 10.5 (mg/dL)   GFR calc non Af Amer 44 (*) >60 (mL/min)   GFR calc Af Amer 54 (*) >60 (mL/min)   No results found.  MDM Right flank pain. History of urinary tract infections. Urine no infection. Good renal function. No rash. Will d/c. Has previous CTs without stone.       Jasper Riling. Alvino Chapel, MD 09/23/10 [phone]

## 2010-09-23 NOTE — ED Notes (Signed)
Pt report UTI symptoms and flank pain radiation to back and chest x 2 weeks

## 2010-09-24 LAB — URINE CULTURE: Culture  Setup Time: 201208060014

## 2010-10-08 ENCOUNTER — Emergency Department (HOSPITAL_COMMUNITY): Payer: Medicare Other

## 2010-10-08 ENCOUNTER — Emergency Department (HOSPITAL_COMMUNITY)
Admission: EM | Admit: 2010-10-08 | Discharge: 2010-10-08 | Disposition: A | Discharge status: Home / Self Care | Payer: Medicare Other | Attending: Emergency Medicine | Admitting: Emergency Medicine

## 2010-10-08 DIAGNOSIS — Z79899 Other long term (current) drug therapy: Secondary | ICD-10-CM | POA: Insufficient documentation

## 2010-10-08 DIAGNOSIS — R109 Unspecified abdominal pain: Secondary | ICD-10-CM | POA: Insufficient documentation

## 2010-10-08 DIAGNOSIS — E1169 Type 2 diabetes mellitus with other specified complication: Secondary | ICD-10-CM | POA: Insufficient documentation

## 2010-10-08 DIAGNOSIS — Z7982 Long term (current) use of aspirin: Secondary | ICD-10-CM | POA: Insufficient documentation

## 2010-10-08 DIAGNOSIS — I10 Essential (primary) hypertension: Secondary | ICD-10-CM | POA: Insufficient documentation

## 2010-10-08 DIAGNOSIS — M545 Low back pain, unspecified: Secondary | ICD-10-CM | POA: Insufficient documentation

## 2010-10-08 DIAGNOSIS — M79609 Pain in unspecified limb: Secondary | ICD-10-CM | POA: Insufficient documentation

## 2010-10-08 DIAGNOSIS — Z794 Long term (current) use of insulin: Secondary | ICD-10-CM | POA: Insufficient documentation

## 2010-10-08 LAB — DIFFERENTIAL
Basophils Relative: 0 % (ref 0–1)
Eosinophils Absolute: 0.2 10*3/uL (ref 0.0–0.7)
Eosinophils Relative: 4 % (ref 0–5)
Monocytes Relative: 10 % (ref 3–12)
Neutrophils Relative %: 47 % (ref 43–77)

## 2010-10-08 LAB — URINALYSIS, ROUTINE W REFLEX MICROSCOPIC
Bilirubin Urine: NEGATIVE
Glucose, UA: NEGATIVE mg/dL
Hgb urine dipstick: NEGATIVE
Ketones, ur: NEGATIVE mg/dL
Protein, ur: NEGATIVE mg/dL

## 2010-10-08 LAB — COMPREHENSIVE METABOLIC PANEL
BUN: 13 mg/dL (ref 6–23)
CO2: 29 mEq/L (ref 19–32)
Calcium: 9.7 mg/dL (ref 8.4–10.5)
Chloride: 104 mEq/L (ref 96–112)
Creatinine, Ser: 1.02 mg/dL (ref 0.50–1.10)
GFR calc Af Amer: >60 mL/min (ref >60)
GFR calc non Af Amer: 53 mL/min — ABNORMAL LOW (ref >60)
Glucose, Bld: 92 mg/dL (ref 70–99)
Total Bilirubin: 0.2 mg/dL — ABNORMAL LOW (ref 0.3–1.2)

## 2010-10-08 LAB — CBC
MCH: 30 pg (ref 26.0–34.0)
MCHC: 32.6 g/dL (ref 30.0–36.0)
Platelets: 234 10*3/uL (ref 150–400)
RBC: 4.26 MIL/uL (ref 3.87–5.11)
RDW: 13.9 % (ref 11.5–15.5)

## 2010-10-08 LAB — GLUCOSE, CAPILLARY
Glucose-Capillary: 45 mg/dL — ABNORMAL LOW (ref 70–99)
Glucose-Capillary: 104 mg/dL — ABNORMAL HIGH (ref 70–99)

## 2010-10-09 LAB — URINE CULTURE

## 2010-10-17 ENCOUNTER — Other Ambulatory Visit: Payer: Self-pay | Admitting: Family Medicine

## 2010-10-17 DIAGNOSIS — N289 Disorder of kidney and ureter, unspecified: Secondary | ICD-10-CM

## 2010-10-19 ENCOUNTER — Other Ambulatory Visit: Payer: Medicare Other

## 2010-10-24 ENCOUNTER — Ambulatory Visit
Admission: RE | Admit: 2010-10-24 | Discharge: 2010-10-24 | Disposition: A | Discharge status: Home / Self Care | Payer: Medicare Other | Source: Ambulatory Visit | Attending: Family Medicine | Admitting: Family Medicine

## 2010-10-24 DIAGNOSIS — N289 Disorder of kidney and ureter, unspecified: Secondary | ICD-10-CM

## 2010-10-25 ENCOUNTER — Ambulatory Visit (HOSPITAL_COMMUNITY)
Admission: RE | Admit: 2010-10-25 | Discharge: 2010-10-25 | Disposition: A | Discharge status: Home / Self Care | Payer: Medicare Other | Source: Ambulatory Visit | Attending: Urology | Admitting: Urology

## 2010-10-25 ENCOUNTER — Other Ambulatory Visit (HOSPITAL_COMMUNITY): Payer: Self-pay | Admitting: Urology

## 2010-10-25 DIAGNOSIS — R0789 Other chest pain: Secondary | ICD-10-CM | POA: Insufficient documentation

## 2010-10-25 DIAGNOSIS — R0602 Shortness of breath: Secondary | ICD-10-CM | POA: Insufficient documentation

## 2010-10-25 DIAGNOSIS — C649 Malignant neoplasm of unspecified kidney, except renal pelvis: Secondary | ICD-10-CM | POA: Insufficient documentation

## 2010-11-09 LAB — COMPREHENSIVE METABOLIC PANEL
ALT: 29
AST: 19
Alkaline Phosphatase: 63
CO2: 27
Chloride: 106
GFR calc Af Amer: >60
GFR calc non Af Amer: 52 — ABNORMAL LOW
Glucose, Bld: 98
Potassium: 3.9
Sodium: 139
Total Bilirubin: 0.5

## 2010-11-09 LAB — CBC
Hemoglobin: 13.6
MCHC: 33.5
RBC: 4.4
WBC: 7.1

## 2010-11-09 LAB — URINE CULTURE

## 2010-11-09 LAB — URINALYSIS, ROUTINE W REFLEX MICROSCOPIC
Glucose, UA: NEGATIVE
Protein, ur: NEGATIVE
Specific Gravity, Urine: 1.013
Urobilinogen, UA: 0.2

## 2010-11-09 LAB — DIFFERENTIAL
Basophils Absolute: 0
Lymphocytes Relative: 17
Lymphs Abs: 1.2
Monocytes Relative: 7
Neutrophils Relative %: 74

## 2010-11-09 LAB — URINE MICROSCOPIC-ADD ON

## 2010-11-09 LAB — WET PREP, GENITAL

## 2010-11-12 LAB — COMPREHENSIVE METABOLIC PANEL
ALT: 26
AST: 17
Albumin: 2.9 — ABNORMAL LOW
Alkaline Phosphatase: 52
Alkaline Phosphatase: 64
BUN: 15
CO2: 25
CO2: 26
Chloride: 109
Chloride: 107
Creatinine, Ser: 1.02
GFR calc Af Amer: >60
GFR calc non Af Amer: 54 — ABNORMAL LOW
GFR calc non Af Amer: 51 — ABNORMAL LOW
Glucose, Bld: 134 — ABNORMAL HIGH
Glucose, Bld: 202 — ABNORMAL HIGH
Potassium: 4.2
Potassium: 4.4
Sodium: 141
Total Bilirubin: 0.5

## 2010-11-12 LAB — URINALYSIS, ROUTINE W REFLEX MICROSCOPIC
Bilirubin Urine: NEGATIVE
Bilirubin Urine: NEGATIVE
Glucose, UA: 250 — AB
Glucose, UA: 100 — AB
Hgb urine dipstick: NEGATIVE
Hgb urine dipstick: NEGATIVE
Ketones, ur: NEGATIVE
Nitrite: NEGATIVE
Protein, ur: NEGATIVE
Protein, ur: NEGATIVE
Protein, ur: NEGATIVE
Specific Gravity, Urine: 1.01
Specific Gravity, Urine: 1.01
Specific Gravity, Urine: 1.007
Urobilinogen, UA: 0.2
Urobilinogen, UA: 0.2
Urobilinogen, UA: 0.2
pH: 6

## 2010-11-12 LAB — CARDIAC PANEL(CRET KIN+CKTOT+MB+TROPI)
CK, MB: 1
Relative Index: INVALID
Relative Index: INVALID
Total CK: 69
Total CK: 76

## 2010-11-12 LAB — DIFFERENTIAL
Basophils Relative: 0
Basophils Relative: 1
Eosinophils Absolute: 0.1
Eosinophils Relative: 2
Lymphocytes Relative: 26
Lymphs Abs: 1.4
Lymphs Abs: 2.2
Monocytes Relative: 8
Neutro Abs: 3.4
Neutrophils Relative %: 48
Neutrophils Relative %: 64

## 2010-11-12 LAB — CBC
HCT: 35.5 — ABNORMAL LOW
Hemoglobin: 12.2
Hemoglobin: 12.2
Hemoglobin: 13.4
MCHC: 33.9
MCHC: 34.9
MCV: 92.6
MCV: 92.8
RBC: 3.83 — ABNORMAL LOW
RBC: 3.87
RBC: 3.89
RBC: 4.21
RBC: 4.32
RDW: 14.9
WBC: 4.7
WBC: 4.9
WBC: 5.3
WBC: 5.7

## 2010-11-12 LAB — MAGNESIUM: Magnesium: 2.2

## 2010-11-12 LAB — BASIC METABOLIC PANEL
CO2: 25
CO2: 29
Calcium: 8.8
Calcium: 9.2
Chloride: 108
Creatinine, Ser: 1.08
Creatinine, Ser: 0.97
GFR calc Af Amer: >60
GFR calc Af Amer: >60
GFR calc Af Amer: >60
GFR calc non Af Amer: 50 — ABNORMAL LOW
GFR calc non Af Amer: 57 — ABNORMAL LOW
Potassium: 4.5
Sodium: 140

## 2010-11-12 LAB — URINE CULTURE
Colony Count: 15000
Colony Count: >=100000

## 2010-11-12 LAB — LIPID PANEL
Triglycerides: 83
VLDL: 17

## 2010-11-12 LAB — POCT CARDIAC MARKERS
CKMB, poc: <1 — ABNORMAL LOW
Myoglobin, poc: 36.8
Myoglobin, poc: 58.2
Operator id: 1192
Operator id: 1192
Troponin i, poc: 0.14 — ABNORMAL HIGH
Troponin i, poc: <0.05
Troponin i, poc: <0.05

## 2010-11-12 LAB — URINE MICROSCOPIC-ADD ON

## 2010-11-12 LAB — TROPONIN I: Troponin I: 0.03

## 2010-11-12 LAB — B-NATRIURETIC PEPTIDE (CONVERTED LAB): Pro B Natriuretic peptide (BNP): <30

## 2010-11-12 LAB — OCCULT BLOOD X 1 CARD TO LAB, STOOL: Fecal Occult Bld: NEGATIVE

## 2010-11-12 LAB — APTT: aPTT: 30

## 2010-11-12 LAB — HEPARIN LEVEL (UNFRACTIONATED): Heparin Unfractionated: <0.1 — ABNORMAL LOW

## 2010-11-12 LAB — D-DIMER, QUANTITATIVE: D-Dimer, Quant: 0.24

## 2010-11-12 LAB — PROTIME-INR: Prothrombin Time: 12.9

## 2010-11-23 LAB — GLUCOSE, CAPILLARY: Glucose-Capillary: 82 mg/dL (ref 70–99)

## 2010-11-26 ENCOUNTER — Other Ambulatory Visit: Payer: Self-pay | Admitting: Family Medicine

## 2010-11-26 ENCOUNTER — Ambulatory Visit
Admission: RE | Admit: 2010-11-26 | Discharge: 2010-11-26 | Disposition: A | Discharge status: Home / Self Care | Payer: Medicare Other | Source: Ambulatory Visit | Attending: Family Medicine | Admitting: Family Medicine

## 2010-11-26 DIAGNOSIS — R52 Pain, unspecified: Secondary | ICD-10-CM

## 2010-11-29 LAB — CK TOTAL AND CKMB (NOT AT ARMC)
CK, MB: 1.9
Relative Index: 1.5
Total CK: 126

## 2010-11-29 LAB — TROPONIN I: Troponin I: 0.02

## 2010-11-29 LAB — BASIC METABOLIC PANEL
CO2: 27
Calcium: 9
Creatinine, Ser: 1.02
GFR calc Af Amer: >60
GFR calc non Af Amer: 54 — ABNORMAL LOW
Sodium: 142

## 2010-11-29 LAB — DIFFERENTIAL
Basophils Absolute: 0
Basophils Relative: 1
Monocytes Absolute: 0.4
Neutro Abs: 3

## 2010-11-29 LAB — PROTIME-INR: Prothrombin Time: 12.2

## 2010-11-29 LAB — CBC
MCHC: 34.3
RBC: 4.13
WBC: 4.7
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
## 2010-12-27 ENCOUNTER — Other Ambulatory Visit: Payer: Self-pay

## 2010-12-27 ENCOUNTER — Encounter (HOSPITAL_COMMUNITY): Payer: Self-pay | Admitting: *Deleted

## 2010-12-27 ENCOUNTER — Emergency Department (HOSPITAL_COMMUNITY): Payer: Medicare Other

## 2010-12-27 ENCOUNTER — Inpatient Hospital Stay (HOSPITAL_COMMUNITY)
Admission: EM | Admit: 2010-12-27 | Discharge: 2010-12-28 | DRG: 313 | Disposition: A | Discharge status: Home / Self Care | Payer: Medicare Other | Attending: Internal Medicine | Admitting: Internal Medicine

## 2010-12-27 DIAGNOSIS — R0789 Other chest pain: Principal | ICD-10-CM | POA: Diagnosis present

## 2010-12-27 DIAGNOSIS — I249 Acute ischemic heart disease, unspecified: Secondary | ICD-10-CM | POA: Diagnosis present

## 2010-12-27 DIAGNOSIS — G5600 Carpal tunnel syndrome, unspecified upper limb: Secondary | ICD-10-CM | POA: Insufficient documentation

## 2010-12-27 DIAGNOSIS — J45909 Unspecified asthma, uncomplicated: Secondary | ICD-10-CM | POA: Diagnosis present

## 2010-12-27 DIAGNOSIS — K219 Gastro-esophageal reflux disease without esophagitis: Secondary | ICD-10-CM | POA: Diagnosis present

## 2010-12-27 DIAGNOSIS — M1990 Unspecified osteoarthritis, unspecified site: Secondary | ICD-10-CM | POA: Insufficient documentation

## 2010-12-27 DIAGNOSIS — I10 Essential (primary) hypertension: Secondary | ICD-10-CM | POA: Diagnosis present

## 2010-12-27 DIAGNOSIS — M4802 Spinal stenosis, cervical region: Secondary | ICD-10-CM | POA: Insufficient documentation

## 2010-12-27 DIAGNOSIS — C649 Malignant neoplasm of unspecified kidney, except renal pelvis: Secondary | ICD-10-CM | POA: Insufficient documentation

## 2010-12-27 DIAGNOSIS — E1159 Type 2 diabetes mellitus with other circulatory complications: Secondary | ICD-10-CM | POA: Diagnosis present

## 2010-12-27 DIAGNOSIS — E785 Hyperlipidemia, unspecified: Secondary | ICD-10-CM | POA: Diagnosis present

## 2010-12-27 DIAGNOSIS — E1169 Type 2 diabetes mellitus with other specified complication: Secondary | ICD-10-CM | POA: Diagnosis present

## 2010-12-27 DIAGNOSIS — E119 Type 2 diabetes mellitus without complications: Secondary | ICD-10-CM | POA: Diagnosis present

## 2010-12-27 HISTORY — DX: Acute ischemic heart disease, unspecified: I24.9

## 2010-12-27 HISTORY — DX: Reserved for inherently not codable concepts without codable children: IMO0001

## 2010-12-27 HISTORY — DX: Pure hypercholesterolemia, unspecified: E78.00

## 2010-12-27 HISTORY — DX: Gastro-esophageal reflux disease without esophagitis: K21.9

## 2010-12-27 LAB — MRSA PCR SCREENING: MRSA by PCR: NEGATIVE

## 2010-12-27 LAB — GLUCOSE, CAPILLARY
Glucose-Capillary: 74 mg/dL (ref 70–99)
Glucose-Capillary: 184 mg/dL — ABNORMAL HIGH (ref 70–99)

## 2010-12-27 LAB — CARDIAC PANEL(CRET KIN+CKTOT+MB+TROPI)
CK, MB: 2.7 ng/mL (ref 0.3–4.0)
Troponin I: <0.3 ng/mL (ref <0.30)

## 2010-12-27 LAB — COMPREHENSIVE METABOLIC PANEL
Alkaline Phosphatase: 78 U/L (ref 39–117)
BUN: 18 mg/dL (ref 6–23)
CO2: 28 mEq/L (ref 19–32)
GFR calc Af Amer: 67 mL/min — ABNORMAL LOW (ref >90)
GFR calc non Af Amer: 58 mL/min — ABNORMAL LOW (ref >90)
Glucose, Bld: 75 mg/dL (ref 70–99)
Potassium: 5 mEq/L (ref 3.5–5.1)
Total Bilirubin: 0.3 mg/dL (ref 0.3–1.2)
Total Protein: 8 g/dL (ref 6.0–8.3)

## 2010-12-27 LAB — CBC
MCHC: 32.8 g/dL (ref 30.0–36.0)
MCV: 93.1 fL (ref 78.0–100.0)
Platelets: 270 10*3/uL (ref 150–400)
RDW: 14.2 % (ref 11.5–15.5)
WBC: 5.8 10*3/uL (ref 4.0–10.5)

## 2010-12-27 LAB — PROTIME-INR: Prothrombin Time: 12.4 seconds (ref 11.6–15.2)

## 2010-12-27 MED ORDER — METOPROLOL TARTRATE 25 MG PO TABS
25.0000 mg | ORAL_TABLET | Freq: Two times a day (BID) | ORAL | Status: DC
  Administered 2010-12-27 – 2010-12-28 (×2): 25 mg via ORAL
  Filled 2010-12-27 (×3): qty 1

## 2010-12-27 MED ORDER — ALUM & MAG HYDROXIDE-SIMETH 200-200-20 MG/5ML PO SUSP: 30.0000 mL | Freq: Four times a day (QID) | ORAL | Status: DC | PRN

## 2010-12-27 MED ORDER — SODIUM CHLORIDE 0.9 % IJ SOLN: 3.0000 mL | INTRAMUSCULAR | Status: DC | PRN

## 2010-12-27 MED ORDER — NITROGLYCERIN 0.4 MG SL SUBL
0.4000 mg | SUBLINGUAL_TABLET | SUBLINGUAL | Status: DC | PRN
  Administered 2010-12-27: 0.4 mg via SUBLINGUAL
  Filled 2010-12-27: qty 25

## 2010-12-27 MED ORDER — PANTOPRAZOLE SODIUM 40 MG PO TBEC
40.0000 mg | DELAYED_RELEASE_TABLET | Freq: Every day | ORAL | Status: DC
  Administered 2010-12-27: 40 mg via ORAL
  Filled 2010-12-27 (×2): qty 1

## 2010-12-27 MED ORDER — NITROGLYCERIN 2 % TD OINT
0.5000 [in_us] | TOPICAL_OINTMENT | Freq: Four times a day (QID) | TRANSDERMAL | Status: DC
  Administered 2010-12-27 – 2010-12-28 (×3): 0.5 [in_us] via TOPICAL
  Filled 2010-12-27 (×2): qty 30

## 2010-12-27 MED ORDER — ACYCLOVIR 400 MG PO TABS
400.0000 mg | ORAL_TABLET | Freq: Two times a day (BID) | ORAL | Status: DC
  Administered 2010-12-27 – 2010-12-28 (×2): 400 mg via ORAL
  Filled 2010-12-27 (×3): qty 1

## 2010-12-27 MED ORDER — ASPIRIN 81 MG PO CHEW
324.0000 mg | CHEWABLE_TABLET | Freq: Once | ORAL | Status: AC
  Administered 2010-12-27: 324 mg via ORAL
  Filled 2010-12-27: qty 4

## 2010-12-27 MED ORDER — ACETAMINOPHEN 325 MG PO TABS: 650.0000 mg | ORAL_TABLET | Freq: Four times a day (QID) | ORAL | Status: DC | PRN

## 2010-12-27 MED ORDER — NITROGLYCERIN 2 % TD OINT
0.5000 [in_us] | TOPICAL_OINTMENT | Freq: Four times a day (QID) | TRANSDERMAL | Status: DC
  Administered 2010-12-27: 20:00:00 via TOPICAL

## 2010-12-27 MED ORDER — MORPHINE SULFATE 2 MG/ML IJ SOLN
1.0000 mg | INTRAMUSCULAR | Status: DC | PRN
  Administered 2010-12-28 (×2): 1 mg via INTRAVENOUS
  Filled 2010-12-27 (×2): qty 1

## 2010-12-27 MED ORDER — SIMVASTATIN 20 MG PO TABS
20.0000 mg | ORAL_TABLET | Freq: Every day | ORAL | Status: DC
  Administered 2010-12-27 – 2010-12-28 (×2): 20 mg via ORAL
  Filled 2010-12-27 (×2): qty 1

## 2010-12-27 MED ORDER — INSULIN ASPART 100 UNIT/ML ~~LOC~~ SOLN: 0.0000 [IU] | Freq: Every day | SUBCUTANEOUS | Status: DC

## 2010-12-27 MED ORDER — NITROGLYCERIN 2 % TD OINT
TOPICAL_OINTMENT | TRANSDERMAL | Status: AC
  Filled 2010-12-27: qty 1

## 2010-12-27 MED ORDER — HYDROCODONE-ACETAMINOPHEN 5-325 MG PO TABS: 1.0000 | ORAL_TABLET | ORAL | Status: DC | PRN

## 2010-12-27 MED ORDER — ASPIRIN EC 325 MG PO TBEC
325.0000 mg | DELAYED_RELEASE_TABLET | Freq: Every day | ORAL | Status: DC
  Administered 2010-12-28: 325 mg via ORAL
  Filled 2010-12-27 (×2): qty 1

## 2010-12-27 MED ORDER — SODIUM CHLORIDE 0.9 % IV SOLN
999.0000 mL | INTRAVENOUS | Status: DC
  Administered 2010-12-27: 1000 mL via INTRAVENOUS

## 2010-12-27 MED ORDER — ENOXAPARIN SODIUM 80 MG/0.8ML ~~LOC~~ SOLN
1.0000 mg/kg | Freq: Two times a day (BID) | SUBCUTANEOUS | Status: DC
  Administered 2010-12-27 – 2010-12-28 (×2): 70 mg via SUBCUTANEOUS
  Filled 2010-12-27 (×3): qty 0.8

## 2010-12-27 MED ORDER — INSULIN ASPART 100 UNIT/ML ~~LOC~~ SOLN
0.0000 [IU] | Freq: Three times a day (TID) | SUBCUTANEOUS | Status: DC
  Administered 2010-12-28: 3 [IU] via SUBCUTANEOUS
  Filled 2010-12-27: qty 3

## 2010-12-27 MED ORDER — ACETAMINOPHEN 650 MG RE SUPP: 650.0000 mg | Freq: Four times a day (QID) | RECTAL | Status: DC | PRN

## 2010-12-27 MED ORDER — ALBUTEROL SULFATE HFA 108 (90 BASE) MCG/ACT IN AERS
1.0000 | INHALATION_SPRAY | RESPIRATORY_TRACT | Status: DC | PRN
  Filled 2010-12-27: qty 6.7

## 2010-12-27 MED ORDER — INSULIN ASPART PROT & ASPART (70-30 MIX) 100 UNIT/ML ~~LOC~~ SUSP
65.0000 [IU] | Freq: Every day | SUBCUTANEOUS | Status: DC
  Administered 2010-12-28: 60 [IU] via SUBCUTANEOUS
  Filled 2010-12-27 (×2): qty 3

## 2010-12-27 MED ORDER — LISINOPRIL 40 MG PO TABS
40.0000 mg | ORAL_TABLET | Freq: Every day | ORAL | Status: DC
  Administered 2010-12-27 – 2010-12-28 (×2): 40 mg via ORAL
  Filled 2010-12-27 (×2): qty 1

## 2010-12-27 NOTE — ED Notes (Signed)
Dr. Windle Guard in to see patient. Complaints of squeezing chest pain that started about 1530 on her way home from school. States that she has radiation to her left arm and into her back.

## 2010-12-27 NOTE — H&P (Signed)
PCP:   Dr [name]  Chief Complaint:  Chest pain  HPI: This is a pleasant 70 y/o female who was driving home when she developed sudden onset of crushing severe chest pain. It was band like across chest, she also had pain in the left arm and shoulder blade. She has never had anything like this before. She does have a h/o GERD and had been off her medications for approximately 2 weeks. She states the pain did not feel like GERD. She has not been having chest pains before. Pain ranked 10/10, sharp, some SOB, mild wheezing, no diaphoresis, no palpitation, no LE edema, some nausea, no vomiting. She has no h/o CAD. She does have DM, HTN, hyperlipidemia. She felt presyncopal. She came to the ER. In the ER she received ASA, and a single nitro and her chest pain resolved. During my interview the chest pain recurred 4/10. She does have cervical spinal stenosis with b/l arm pain and B/L carpal tunnel syndrome. History provided by patient who appears reliable.  Review of Systems: (positive bolded) The patient denies anorexia, fever, weight loss,, vision loss, decreased hearing, hoarseness, chest pain, presyncope, dyspnea on exertion, peripheral edema, balance deficits, hemoptysis, abdominal pain, melena, hematochezia, severe indigestion/heartburn, hematuria, incontinence, genital sores, muscle weakness, suspicious skin lesions, transient blindness, difficulty walking, depression, unusual weight change, abnormal bleeding, enlarged lymph nodes, angioedema, and breast masses.  Past Medical History: Past Medical History  Diagnosis Date  . Diabetes mellitus   . Hypertension   . Renal failure (ARF), acute on chronic   . Hypercholesteremia   . Reflux    Past Surgical History  Procedure Date  . Renal mass excision   . Abdominal hysterectomy   . Appendectomy     Medications: Prior to Admission medications   Medication Sig Start Date End Date Taking? Authorizing Provider  acyclovir (ZOVIRAX) 400 MG  tablet Take 400 mg by mouth 2 (two) times daily.     Yes Historical Provider, MD  albuterol (PROVENTIL,VENTOLIN) 90 MCG/ACT inhaler Inhale 1-2 puffs into the lungs every 4 (four) hours as needed. For shortness of breath and wheezing.   Yes Historical Provider, MD  amLODipine (NORVASC) 2.5 MG tablet Take 2.5 mg by mouth daily.     Yes Historical Provider, MD  aspirin 81 MG EC tablet Take 81 mg by mouth daily.     Yes Historical Provider, MD  atorvastatin (LIPITOR) 10 MG tablet Take 10 mg by mouth daily.     Yes Historical Provider, MD  Cholecalciferol (VITAMIN D) 1000 UNITS capsule Take 1,000 Units by mouth daily.     Yes Historical Provider, MD  esomeprazole (NEXIUM) 40 MG capsule Take 40 mg by mouth daily before breakfast.     Yes Historical Provider, MD  insulin NPH-insulin regular (NOVOLIN 70/30) (70-30) 100 UNIT/ML injection Inject 50-65 Units into the skin 2 (two) times daily with a meal. 65 in am, 50 in pm    Yes Historical Provider, MD  lisinopril (PRINIVIL,ZESTRIL) 40 MG tablet Take 40 mg by mouth daily.     Yes Historical Provider, MD  Multiple Vitamin (MULTIVITAMIN) tablet Take 1 tablet by mouth daily.     Yes Historical Provider, MD    Allergies:   Allergies  Allergen Reactions  . Sulfa Antibiotics Itching    Social History:  reports that she has never smoked. She does not have any smokeless tobacco history on file. She reports that she does not drink alcohol or use illicit drugs.  Family History: Family  History  Problem Relation Age of Onset  . Coronary artery disease    . Hypertension Mother     Physical Exam: Filed Vitals:   12/27/10 1628 12/27/10 1741 12/27/10 1849 12/27/10 2026  BP:  150/66 163/93 138/56  Pulse:  75 74 72  Temp:      TempSrc:      Resp:  16 21 18   Weight: 70.308 kg (155 lb)     SpO2:  100% 100% 100%    General:  Alert and oriented times three, well developed and nourished, no acute distress Eyes: PERRLA, pink conjunctiva, scleral  icterus ENT: Moist oral mucosa, neck supple, no thyromegaly Lungs: clear to ascultation, no wheeze, no crackles, no use of accessory muscles Cardiovascular: regular rate and rhythm, no regurgitation, no gallops, no murmurs. No carotid bruits, no JVD Abdomen: soft, positive BS, non-tender, non-distended, no organomegaly, not an acute abdomen GU: not examined Neuro: CN II - XII grossly intact, sensation intact Musculoskeletal: strength 5/5 all extremities, no clubbing, cyanosis or edema, no reproducible chest wall pain Skin: no rash, no subcutaneous crepitation, no decubitus Psych: appropriate patient   Labs on Admission:   [facility] 12/27/10 1730  NA 139  K 5.0  CL 102  CO2 28  GLUCOSE 75  BUN 18  CREATININE 0.96  CALCIUM 10.0  MG --  PHOS --    Basename 12/27/10 1730  AST 31  ALT 20  ALKPHOS 78  BILITOT 0.3  PROT 8.0  ALBUMIN 3.7   No results found for this basename: LIPASE:2,AMYLASE:2 in the last 72 hours  Basename 12/27/10 1730  WBC 5.8  NEUTROABS --  HGB 13.7  HCT 41.8  MCV 93.1  PLT 270    Basename 12/27/10 1730  CKTOTAL 193*  CKMB 2.7  CKMBINDEX --  TROPONINI <0.30   No results found for this basename: TSH,T4TOTAL,FREET3,T3FREE,THYROIDAB in the last 72 hours No results found for this basename: VITAMINB12:2,FOLATE:2,FERRITIN:2,TIBC:2,IRON:2,RETICCTPCT:2 in the last 72 hours  Radiological Exams on Admission: Dg Chest 2 View  12/27/2010  *RADIOLOGY REPORT*  Clinical Data: Left-sided chest pain.  History of diabetes and hypertension.  CHEST - 2 VIEW 12/27/2010:  Comparison: Two-view chest x-ray 10/25/2010, 04/25/2010 [facility] and 07/19/2010 [facility].  Findings: Cardiac silhouette normal in size for the AP technique, unchanged.  Thoracic aorta mildly tortuous, unchanged.  Hilar and mediastinal contours otherwise unremarkable.  Lungs clear. Pulmonary vascularity normal.  Bronchovascular markings normal.  No pleural effusions.   Degenerative changes involving the thoracic spine.  No significant interval change.  IMPRESSION: No acute cardiopulmonary disease.  Stable examination.  Original Report Authenticated By: Deniece Portela, M.D.    EKG: NSR  Assessment/Plan Present on Admission:  .ACS (acute coronary syndrome) -admit to step down -cycle cardiac enzymes -lipid panel in AM -ASA, Nitropaste, lovenox, B blocker, statin ordered -oxygen, prn pain meds -stress test in AM DM  HTN Hyperlipidemia -stable -resume home meds -ADA diet GERD -resume PPI H/o renal cell cancer Arthritis -stable    Salvadore Valvano 12/27/2010, 8:28 PM

## 2010-12-27 NOTE — ED Notes (Signed)
Pt states "cp started when I was leaving school, it's a squeezing pain that is in my left chest and radiates into my back"

## 2010-12-27 NOTE — ED Notes (Signed)
Pt given sandwhich to eat per Dr. Kae Heller

## 2010-12-27 NOTE — ED Provider Notes (Addendum)
History     CSN: [id number] Arrival date & time: 12/27/2010  4:00 PM   First MD Initiated Contact with Patient 12/27/10 1652      Chief Complaint  Patient presents with  . Chest Pain    (Consider location/radiation/quality/duration/timing/severity/associated sxs/prior treatment) HPI Comments: Patient is a 70 year old female with a history of diabetes, hypertension, hyperlipidemia, who presents for evaluation of chest pain. She reports that the pain started around 3:30 this afternoon while she was driving home from school. She reports that the pain was squeezing in character, localized to her left chest, radiating through to her back, associated with shortness of breath and nausea, but no diaphoresis. She reports the pain was initially 9/10 in intensity and at this time is 3/10 in intensity as the pain spontaneously decreased. She denies any worsening or alleviating factors. She denies any prior history of chest pain or cardiac disease. At present she appears in no acute distress.  Patient is a 70 y.o. female presenting with chest pain. The history is provided by the patient.  Chest Pain The chest pain began 1 - 2 hours ago. Duration of episode(s) is 2 hours. Chest pain occurs constantly. The chest pain is improving. Associated with: Nothing. At its most intense, the pain is at 9/10. The pain is currently at 3/10. The quality of the pain is described as squeezing. The pain radiates to the left shoulder. Exacerbated by: Nothing. Primary symptoms include shortness of breath and nausea. Pertinent negatives for primary symptoms include no fever, no fatigue, no syncope, no cough, no wheezing, no palpitations, no abdominal pain, no vomiting, no dizziness and no altered mental status.  Pertinent negatives for associated symptoms include no claudication, no diaphoresis, no lower extremity edema, no near-syncope, no numbness, no orthopnea, no paroxysmal nocturnal dyspnea and no weakness. She tried nothing  for the symptoms. Risk factors include being elderly and obesity.  Her past medical history is significant for diabetes, hyperlipidemia and hypertension.  Pertinent negatives for past medical history include no CAD.     Past Medical History  Diagnosis Date  . Diabetes mellitus   . Hypertension   . Renal failure (ARF), acute on chronic   . Hypercholesteremia     Past Surgical History  Procedure Date  . Renal mass excision   . Abdominal hysterectomy   . Appendectomy     No family history on file.  History  Substance Use Topics  . Smoking status: Never Smoker   . Smokeless tobacco: Not on file  . Alcohol Use: No    OB History    Grav Para Term Preterm Abortions TAB SAB Ect Mult Living                  Review of Systems  Constitutional: Negative for fever, chills, diaphoresis and fatigue.  HENT: Negative for congestion, rhinorrhea, trouble swallowing, neck pain and postnasal drip.   Eyes: Negative.   Respiratory: Positive for shortness of breath. Negative for cough, wheezing and stridor.   Cardiovascular: Positive for chest pain. Negative for palpitations, orthopnea, claudication, leg swelling, syncope and near-syncope.  Gastrointestinal: Positive for nausea. Negative for vomiting and abdominal pain.  Musculoskeletal: Negative for back pain.  Skin: Negative for color change, pallor and rash.  Neurological: Negative for dizziness, weakness and numbness.  Hematological: Does not bruise/bleed easily.  Psychiatric/Behavioral: Negative.  Negative for altered mental status.    Allergies  Sulfa antibiotics  Home Medications   Current Outpatient Rx  Name Route Sig Dispense Refill  .  ACYCLOVIR 400 MG PO TABS Oral Take 400 mg by mouth 2 (two) times daily.      . ALBUTEROL 90 MCG/ACT IN AERS Inhalation Inhale 1-2 puffs into the lungs every 4 (four) hours as needed. For shortness of breath and wheezing.    Marland Kitchen AMLODIPINE BESYLATE 2.5 MG PO TABS Oral Take 2.5 mg by mouth  daily.      . ASPIRIN 81 MG PO TBEC Oral Take 81 mg by mouth daily.      . ATORVASTATIN CALCIUM 10 MG PO TABS Oral Take 10 mg by mouth daily.      Marland Kitchen VITAMIN D 1000 UNITS PO CAPS Oral Take 1,000 Units by mouth daily.      Marland Kitchen ESOMEPRAZOLE MAGNESIUM 40 MG PO CPDR Oral Take 40 mg by mouth daily before breakfast.      . INSULIN ISOPHANE & REGULAR (70-30) 100 UNIT/ML [place] SUSP Subcutaneous Inject 50-65 Units into the skin 2 (two) times daily with a meal. 65 in am, 50 in pm     . LISINOPRIL 40 MG PO TABS Oral Take 40 mg by mouth daily.      Marland Kitchen ONE-DAILY MULTI VITAMINS PO TABS Oral Take 1 tablet by mouth daily.        BP 143/69  Pulse 83  Temp(Src) 99.3 F (37.4 C) (Oral)  Resp 22  Wt 155 lb (70.308 kg)  SpO2 100%  Physical Exam  Nursing note and vitals reviewed. Constitutional: She is oriented to person, place, and time. She appears well-developed and well-nourished. She is cooperative. She does not have a sickly appearance. She does not appear ill. No distress.  HENT:  Head: Normocephalic and atraumatic.  Right Ear: Hearing, tympanic membrane, external ear and ear canal normal.  Left Ear: Hearing, tympanic membrane, external ear and ear canal normal.  Nose: Nose normal.  Mouth/Throat: Oropharynx is clear and moist. No oropharyngeal exudate.  Eyes: Conjunctivae and EOM are normal. Pupils are equal, round, and reactive to light. Right eye exhibits no discharge. Left eye exhibits no discharge.  Neck: Normal range of motion and full passive range of motion without pain. Neck supple. No JVD present. No rigidity. No tracheal deviation present. No thyromegaly present.  Cardiovascular: Normal rate, regular rhythm, normal heart sounds and intact distal pulses.   No extrasystoles are present. Exam reveals no gallop, no S3, no S4 and no friction rub.   No murmur heard. Pulmonary/Chest: Effort normal and breath sounds normal. No accessory muscle usage or stridor. Not tachypneic. No respiratory distress. She  has no wheezes. She has no rales. She exhibits no tenderness.  Abdominal: Soft. Bowel sounds are normal. She exhibits no distension. There is no tenderness.  Musculoskeletal: Normal range of motion. She exhibits no edema and no tenderness.  Lymphadenopathy:    She has no cervical adenopathy.  Neurological: She is alert and oriented to person, place, and time. She has normal reflexes. No cranial nerve deficit. Coordination normal.  Skin: Skin is warm and dry. No rash noted. She is not diaphoretic. No erythema. No pallor.  Psychiatric: She has a normal mood and affect. Her behavior is normal. Judgment and thought content normal.    ED Course  Procedures (including critical care time)  Date: 12/27/2010  Rate: 72  Rhythm: normal sinus rhythm  QRS Axis: normal  Intervals: normal  ST/T Wave abnormalities: normal  Conduction Disutrbances:none  Narrative Interpretation: Non-provocative EKG  Old EKG Reviewed: unchanged    Labs Reviewed  CBC  CARDIAC PANEL(CRET KIN+CKTOT+MB+TROPI)  PRO B NATRIURETIC PEPTIDE  COMPREHENSIVE METABOLIC PANEL  PROTIME-INR  APTT   No results found.   No diagnosis found. 7:06 PM The patient reports no chest pain after a single nitroglycerin sublingual. Her cardiac enzymes are negative but her symptoms and comorbidities are concerning enough that I will seek admission for the patient for chest pain rule out.   MDM  Myocardial infarction, acute coronary syndrome, Musculoskeletal chest pain, costochondritis, GERD, Gastrointestinal Chest Pain, Pleuritic Chest Pain, Pneumonia, Pneumothorax, Pulmonary Embolism, Esophageal Spasm, Arrhythmia considered among other potential etiologies in the patient's differential diagnosis.          Charlena Cross, MD 12/27/10 1726  Charlena Cross, MD 12/27/10 Greer Ee  Charlena Cross, MD 12/27/10 [phone]

## 2010-12-28 LAB — BASIC METABOLIC PANEL
BUN: 16 mg/dL (ref 6–23)
Creatinine, Ser: 1 mg/dL (ref 0.50–1.10)
GFR calc Af Amer: 64 mL/min — ABNORMAL LOW (ref >90)
GFR calc non Af Amer: 55 mL/min — ABNORMAL LOW (ref >90)

## 2010-12-28 LAB — CBC
MCHC: 32.1 g/dL (ref 30.0–36.0)
Platelets: 236 10*3/uL (ref 150–400)
RDW: 14.2 % (ref 11.5–15.5)

## 2010-12-28 LAB — LIPID PANEL
Cholesterol: 137 mg/dL (ref 0–200)
Triglycerides: 97 mg/dL (ref <150)

## 2010-12-28 LAB — GLUCOSE, CAPILLARY: Glucose-Capillary: 158 mg/dL — ABNORMAL HIGH (ref 70–99)

## 2010-12-28 MED ORDER — SODIUM CHLORIDE 0.9 % IV SOLN
INTRAVENOUS | Status: DC
  Administered 2010-12-28: 20 mL via INTRAVENOUS

## 2010-12-28 MED ORDER — DIAZEPAM 2 MG PO TABS: 2.0000 mg | ORAL_TABLET | Freq: Four times a day (QID) | ORAL | Status: AC | PRN

## 2010-12-28 MED ORDER — HYDROCODONE-ACETAMINOPHEN 5-325 MG PO TABS: 1.0000 | ORAL_TABLET | ORAL | Status: AC | PRN

## 2010-12-28 MED ORDER — NITROGLYCERIN 0.4 MG SL SUBL: 0.4000 mg | SUBLINGUAL_TABLET | SUBLINGUAL | Status: DC | PRN

## 2010-12-28 MED ORDER — METOPROLOL TARTRATE 25 MG PO TABS: 25.0000 mg | ORAL_TABLET | Freq: Two times a day (BID) | ORAL | Status: DC

## 2010-12-28 NOTE — Progress Notes (Signed)
Interval History: [name] was admitted last night with atypical (non-exertional) chest pain.  She had cardiac markers cycled over night and was admitted to the SDU for concerns of ACS.    ROS: The patient continues to report sharp, intermittent chest pain.  The pain persists despite treatment with nitropaste.  She also admits to increased stress at home, which are exacerbating her symptoms.  She has a good appetite, is currently chest pain free, and without nausea.   Objective: Vital signs in last 24 hours: Temp:  [97.6 F (36.4 C)-99.3 F (37.4 C)] 98.4 F (36.9 C) (11/09 0800) Pulse Rate:  [64-88] 71  (11/09 0800) Resp:  [11-22] 16  (11/09 0800) BP: (91-163)/(52-93) 118/66 mmHg (11/09 0800) SpO2:  [98 %-100 %] 100 % (11/09 0800) Weight:  [70.308 kg (155 lb)-71.94 kg (158 lb 9.6 oz)] 158 lb 9.6 oz (71.94 kg) (11/08 2214) Weight change:  Last BM Date: 12/27/10  Intake/Output from previous day: 11/08 0701 - 11/09 0700 In: 20 [I.V.:20] Out: 600 [Urine:600]  CBG (last 3)   Basename 12/28/10 0720 12/27/10 2210 12/27/10 1747  GLUCAP 158* 184* 74         Physical Exam: General Appearance:    Alert, cooperative, no distress, appears stated age  Lungs:     Clear to auscultation bilaterally, respirations unlabored   Heart:    Regular rate and rhythm, S1 and S2 normal, no murmur, rub   or gallop  Abdomen:     Soft, non-tender, bowel sounds active all four quadrants,    no masses, no organomegaly  Extremities:   Extremities normal, atraumatic, no cyanosis or edema  Pulses:   2+ and symmetric all extremities     Lab Results: Results for orders placed during the hospital encounter of 12/27/10 (from the past 24 hour(s))  CBC     Status: Normal   Collection Time   12/27/10  5:30 PM      Component Value Range   WBC 5.8  4.0 - 10.5 (K/uL)   RBC 4.49  3.87 - 5.11 (MIL/uL)   Hemoglobin 13.7  12.0 - 15.0 (g/dL)   HCT 41.8  36.0 - 46.0 (%)   MCV 93.1  78.0 - 100.0 (fL)   MCH  30.5  26.0 - 34.0 (pg)   MCHC 32.8  30.0 - 36.0 (g/dL)   RDW 14.2  11.5 - 15.5 (%)   Platelets 270  150 - 400 (K/uL)  CARDIAC PANEL(CRET KIN+CKTOT+MB+TROPI)     Status: Abnormal   Collection Time   12/27/10  5:30 PM      Component Value Range   Total CK 193 (*) 7 - 177 (U/L)   CK, MB 2.7  0.3 - 4.0 (ng/mL)   Troponin I <0.30  <0.30 (ng/mL)   Relative Index 1.4  0.0 - 2.5   PRO B NATRIURETIC PEPTIDE     Status: Normal   Collection Time   12/27/10  5:30 PM      Component Value Range   BNP, POC 10.4  0 - 125 (pg/mL)  COMPREHENSIVE METABOLIC PANEL     Status: Abnormal   Collection Time   12/27/10  5:30 PM      Component Value Range   Sodium 139  135 - 145 (mEq/L)   Potassium 5.0  3.5 - 5.1 (mEq/L)   Chloride 102  96 - 112 (mEq/L)   CO2 28  19 - 32 (mEq/L)   Glucose, Bld 75  70 - 99 (mg/dL)  BUN 18  6 - 23 (mg/dL)   Creatinine, Ser 0.96  0.50 - 1.10 (mg/dL)   Calcium 10.0  8.4 - 10.5 (mg/dL)   Total Protein 8.0  6.0 - 8.3 (g/dL)   Albumin 3.7  3.5 - 5.2 (g/dL)   AST 31  0 - 37 (U/L)   ALT 20  0 - 35 (U/L)   Alkaline Phosphatase 78  39 - 117 (U/L)   Total Bilirubin 0.3  0.3 - 1.2 (mg/dL)   GFR calc non Af Amer 58 (*) >90 (mL/min)   GFR calc Af Amer 67 (*) >90 (mL/min)  PROTIME-INR     Status: Normal   Collection Time   12/27/10  5:30 PM      Component Value Range   Prothrombin Time 12.4  11.6 - 15.2 (seconds)   INR 0.91  0.00 - 1.49   APTT     Status: Normal   Collection Time   12/27/10  5:30 PM      Component Value Range   aPTT 32  24 - 37 (seconds)  GLUCOSE, CAPILLARY     Status: Normal   Collection Time   12/27/10  5:47 PM      Component Value Range   Glucose-Capillary 74  70 - 99 (mg/dL)  GLUCOSE, CAPILLARY     Status: Abnormal   Collection Time   12/27/10 10:10 PM      Component Value Range   Glucose-Capillary 184 (*) 70 - 99 (mg/dL)  MRSA PCR SCREENING     Status: Normal   Collection Time   12/27/10 10:17 PM      Component Value Range   MRSA by PCR NEGATIVE   NEGATIVE   LIPID PANEL     Status: Normal   Collection Time   12/28/10 12:10 AM      Component Value Range   Cholesterol 137  0 - 200 (mg/dL)   Triglycerides 97  <150 (mg/dL)   HDL 43  >39 (mg/dL)   Total CHOL/HDL Ratio 3.2     VLDL 19  0 - 40 (mg/dL)   LDL Cholesterol 75  0 - 99 (mg/dL)  TROPONIN I     Status: Normal   Collection Time   12/28/10 12:10 AM      Component Value Range   Troponin I <0.30  <0.30 (ng/mL)  BASIC METABOLIC PANEL     Status: Abnormal   Collection Time   12/28/10  3:14 AM      Component Value Range   Sodium 139  135 - 145 (mEq/L)   Potassium 4.3  3.5 - 5.1 (mEq/L)   Chloride 107  96 - 112 (mEq/L)   CO2 24  19 - 32 (mEq/L)   Glucose, Bld 131 (*) 70 - 99 (mg/dL)   BUN 16  6 - 23 (mg/dL)   Creatinine, Ser 1.00  0.50 - 1.10 (mg/dL)   Calcium 9.2  8.4 - 10.5 (mg/dL)   GFR calc non Af Amer 55 (*) >90 (mL/min)   GFR calc Af Amer 64 (*) >90 (mL/min)  CBC     Status: Abnormal   Collection Time   12/28/10  3:14 AM      Component Value Range   WBC 5.8  4.0 - 10.5 (K/uL)   RBC 3.91  3.87 - 5.11 (MIL/uL)   Hemoglobin 11.7 (*) 12.0 - 15.0 (g/dL)   HCT 36.5  36.0 - 46.0 (%)   MCV 93.4  78.0 - 100.0 (fL)   MCH 29.9  26.0 - 34.0 (pg)   MCHC 32.1  30.0 - 36.0 (g/dL)   RDW 14.2  11.5 - 15.5 (%)   Platelets 236  150 - 400 (K/uL)  GLUCOSE, CAPILLARY     Status: Abnormal   Collection Time   12/28/10  7:20 AM      Component Value Range   Glucose-Capillary 158 (*) 70 - 99 (mg/dL)  TROPONIN I     Status: Normal   Collection Time   12/28/10  8:05 AM      Component Value Range   Troponin I <0.30  <0.30 (ng/mL)   Recent Results (from the past 240 hour(s))  MRSA PCR SCREENING     Status: Normal   Collection Time   12/27/10 10:17 PM      Component Value Range Status Comment   MRSA by PCR NEGATIVE  NEGATIVE  Final     Studies/Results: Dg Chest 2 View  12/27/2010  *RADIOLOGY REPORT*  Clinical Data: Left-sided chest pain.  History of diabetes and hypertension.  CHEST -  2 VIEW 12/27/2010:  Comparison: Two-view chest x-ray 10/25/2010, 04/25/2010 [facility] and 07/19/2010 [facility].  Findings: Cardiac silhouette normal in size for the AP technique, unchanged.  Thoracic aorta mildly tortuous, unchanged.  Hilar and mediastinal contours otherwise unremarkable.  Lungs clear. Pulmonary vascularity normal.  Bronchovascular markings normal.  No pleural effusions.  Degenerative changes involving the thoracic spine.  No significant interval change.  IMPRESSION: No acute cardiopulmonary disease.  Stable examination.  Original Report Authenticated By: Deniece Portela, M.D.    Medications: Scheduled Meds:   . acyclovir  400 mg Oral BID  . aspirin  324 mg Oral Once  . aspirin EC  325 mg Oral Daily  . enoxaparin (LOVENOX) injection  1 mg/kg Subcutaneous Q12H  . insulin aspart  0-15 Units Subcutaneous TID WC  . insulin aspart  0-5 Units Subcutaneous QHS  . insulin aspart protamine-insulin aspart  65 Units Subcutaneous QAC breakfast  . lisinopril  40 mg Oral Daily  . metoprolol tartrate  25 mg Oral BID  . nitroGLYCERIN  0.5 inch Topical Q6H  . pantoprazole  40 mg Oral Q0600  . simvastatin  20 mg Oral Daily  . DISCONTD: nitroGLYCERIN  0.5 inch Topical Q6H   Continuous Infusions:   . sodium chloride 20 mL (12/28/10 0529)  . DISCONTD: sodium chloride 1,000 mL (12/27/10 1749)   PRN Meds:.acetaminophen, acetaminophen, albuterol, alum & mag hydroxide-simeth, HYDROcodone-acetaminophen, morphine, sodium chloride, DISCONTD: nitroGLYCERIN  Assessment/Plan:  Principal Problem:  *ACS (acute coronary syndrome): Last catheterization on January 2008, showed only a 20% right coronary artery lesion and normal LV function with an ejection fraction of 60%. Her cardiac enzymes are negative and her pain is somewhat atypical in that it is sharp, intermittent, non-exertional.  Her pro-BNP is not elevated.  Her EKG showed normal sinus rhythm with no abnormalities, no  change from prior EKG's.  The patient admits that she is under stress and thinks her symptoms may be related.  We will prescribe valium at discharge.  She has been fully instructed on the safe use of this medication, and it's addictive potential.   Active Problems:  Diabetes mellitus: CBG's 74-184 over past 24 hours.  Hypertension: Controlled.  Hyperlipidemia: Controlled.  GERD (gastroesophageal reflux disease): Continue PPI therapy.  Asthma: Stable.    LOS: 1 day   RAMA,CHRISTINA 12/28/2010, 9:21 AM

## 2010-12-28 NOTE — Discharge Summary (Signed)
Physician Discharge Summary  Patient ID: [name] MRN: [id number] DOB/AGE: 72-Feb-1942 61 y.o.  Admit date: 12/27/2010 Discharge date: 12/28/2010  Primary Care Physician:  Dr. [name] Cardiologist: Dr. Shelva Majestic   Discharge Diagnoses:    Present on Admission:  -Chest pain, rule out ACS .Diabetes mellitus .Hypertension .Hyperlipidemia .GERD (gastroesophageal reflux disease) .Asthma  Discharge Medications:  Current Discharge Medication List    START taking these medications   Details  diazepam (VALIUM) 2 MG tablet Take 1 tablet (2 mg total) by mouth every 6 (six) hours as needed for anxiety. Qty: 30 tablet, Refills: 0    HYDROcodone-acetaminophen (NORCO) 5-325 MG per tablet Take 1-2 tablets by mouth every 4 (four) hours as needed. Qty: 30 tablet, Refills: 0    metoprolol tartrate (LOPRESSOR) 25 MG tablet Take 1 tablet (25 mg total) by mouth 2 (two) times daily. Qty: 60 tablet, Refills: 0    nitroGLYCERIN (NITROSTAT) 0.4 MG SL tablet Place 1 tablet (0.4 mg total) under the tongue every 5 (five) minutes as needed for chest pain. Qty: 30 tablet, Refills: 12      CONTINUE these medications which have NOT CHANGED   Details  acyclovir (ZOVIRAX) 400 MG tablet Take 400 mg by mouth 2 (two) times daily.      albuterol (PROVENTIL,VENTOLIN) 90 MCG/ACT inhaler Inhale 1-2 puffs into the lungs every 4 (four) hours as needed. For shortness of breath and wheezing.    amLODipine (NORVASC) 2.5 MG tablet Take 2.5 mg by mouth daily.      aspirin 81 MG EC tablet Take 81 mg by mouth daily.      atorvastatin (LIPITOR) 10 MG tablet Take 10 mg by mouth daily.      Cholecalciferol (VITAMIN D) 1000 UNITS capsule Take 1,000 Units by mouth daily.      esomeprazole (NEXIUM) 40 MG capsule Take 40 mg by mouth daily before breakfast.      insulin NPH-insulin regular (NOVOLIN 70/30) (70-30) 100 UNIT/ML injection Inject 50-65 Units into the skin 2 (two) times daily with a meal. 65 in  am, 50 in pm     lisinopril (PRINIVIL,ZESTRIL) 40 MG tablet Take 40 mg by mouth daily.      Multiple Vitamin (MULTIVITAMIN) tablet Take 1 tablet by mouth daily.           Disposition and Follow-up: The patient is being discharged to home.  She is encouraged to follow-up with Dr. Berdine Addison and Dr. Claiborne Billings for non-resolution of symptoms.  Consults:  None   Significant Diagnostic Studies:  Dg Chest 2 View  12/27/2010  *RADIOLOGY REPORT*  Clinical Data: Left-sided chest pain.  History of diabetes and hypertension.  CHEST - 2 VIEW 12/27/2010:  Comparison: Two-view chest x-ray 10/25/2010, 04/25/2010 [facility] and 07/19/2010 [facility].  Findings: Cardiac silhouette normal in size for the AP technique, unchanged.  Thoracic aorta mildly tortuous, unchanged.  Hilar and mediastinal contours otherwise unremarkable.  Lungs clear. Pulmonary vascularity normal.  Bronchovascular markings normal.  No pleural effusions.  Degenerative changes involving the thoracic spine.  No significant interval change.  IMPRESSION: No acute cardiopulmonary disease.  Stable examination.  Original Report Authenticated By: Deniece Portela, M.D.    Discharge Laboratory Values: Results for orders placed during the hospital encounter of 12/27/10 (from the past 48 hour(s))  CBC     Status: Normal   Collection Time   12/27/10  5:30 PM      Component Value Range Comment   WBC 5.8  4.0 - 10.5 (K/uL)    RBC 4.49  3.87 - 5.11 (MIL/uL)    Hemoglobin 13.7  12.0 - 15.0 (g/dL)    HCT 41.8  36.0 - 46.0 (%)    MCV 93.1  78.0 - 100.0 (fL)    MCH 30.5  26.0 - 34.0 (pg)    MCHC 32.8  30.0 - 36.0 (g/dL)    RDW 14.2  11.5 - 15.5 (%)    Platelets 270  150 - 400 (K/uL)   CARDIAC PANEL(CRET KIN+CKTOT+MB+TROPI)     Status: Abnormal   Collection Time   12/27/10  5:30 PM      Component Value Range Comment   Total CK 193 (*) 7 - 177 (U/L) SLIGHT HEMOLYSIS   CK, MB 2.7  0.3 - 4.0 (ng/mL)    Troponin I <0.30  <0.30 (ng/mL)      Relative Index 1.4  0.0 - 2.5    PRO B NATRIURETIC PEPTIDE     Status: Normal   Collection Time   12/27/10  5:30 PM      Component Value Range Comment   BNP, POC 10.4  0 - 125 (pg/mL)   COMPREHENSIVE METABOLIC PANEL     Status: Abnormal   Collection Time   12/27/10  5:30 PM      Component Value Range Comment   Sodium 139  135 - 145 (mEq/L)    Potassium 5.0  3.5 - 5.1 (mEq/L) MODERATE HEMOLYSIS   Chloride 102  96 - 112 (mEq/L)    CO2 28  19 - 32 (mEq/L)    Glucose, Bld 75  70 - 99 (mg/dL)    BUN 18  6 - 23 (mg/dL)    Creatinine, Ser 0.96  0.50 - 1.10 (mg/dL)    Calcium 10.0  8.4 - 10.5 (mg/dL)    Total Protein 8.0  6.0 - 8.3 (g/dL)    Albumin 3.7  3.5 - 5.2 (g/dL)    AST 31  0 - 37 (U/L) MODERATE HEMOLYSIS   ALT 20  0 - 35 (U/L) MODERATE HEMOLYSIS   Alkaline Phosphatase 78  39 - 117 (U/L) MODERATE HEMOLYSIS   Total Bilirubin 0.3  0.3 - 1.2 (mg/dL)    GFR calc non Af Amer 58 (*) >90 (mL/min)    GFR calc Af Amer 67 (*) >90 (mL/min)   PROTIME-INR     Status: Normal   Collection Time   12/27/10  5:30 PM      Component Value Range Comment   Prothrombin Time 12.4  11.6 - 15.2 (seconds)    INR 0.91  0.00 - 1.49    APTT     Status: Normal   Collection Time   12/27/10  5:30 PM      Component Value Range Comment   aPTT 32  24 - 37 (seconds)   GLUCOSE, CAPILLARY     Status: Normal   Collection Time   12/27/10  5:47 PM      Component Value Range Comment   Glucose-Capillary 74  70 - 99 (mg/dL)   GLUCOSE, CAPILLARY     Status: Abnormal   Collection Time   12/27/10 10:10 PM      Component Value Range Comment   Glucose-Capillary 184 (*) 70 - 99 (mg/dL)   MRSA PCR SCREENING     Status: Normal   Collection Time   12/27/10 10:17 PM      Component Value Range Comment   MRSA by PCR NEGATIVE  NEGATIVE  LIPID PANEL     Status: Normal   Collection Time   12/28/10 12:10 AM      Component Value Range Comment   Cholesterol 137  0 - 200 (mg/dL)    Triglycerides 97  <150 (mg/dL)    HDL 43   >39 (mg/dL)    Total CHOL/HDL Ratio 3.2      VLDL 19  0 - 40 (mg/dL)    LDL Cholesterol 75  0 - 99 (mg/dL)   TROPONIN I     Status: Normal   Collection Time   12/28/10 12:10 AM      Component Value Range Comment   Troponin I <0.30  <0.30 (ng/mL)   BASIC METABOLIC PANEL     Status: Abnormal   Collection Time   12/28/10  3:14 AM      Component Value Range Comment   Sodium 139  135 - 145 (mEq/L)    Potassium 4.3  3.5 - 5.1 (mEq/L)    Chloride 107  96 - 112 (mEq/L)    CO2 24  19 - 32 (mEq/L)    Glucose, Bld 131 (*) 70 - 99 (mg/dL)    BUN 16  6 - 23 (mg/dL)    Creatinine, Ser 1.00  0.50 - 1.10 (mg/dL)    Calcium 9.2  8.4 - 10.5 (mg/dL)    GFR calc non Af Amer 55 (*) >90 (mL/min)    GFR calc Af Amer 64 (*) >90 (mL/min)   CBC     Status: Abnormal   Collection Time   12/28/10  3:14 AM      Component Value Range Comment   WBC 5.8  4.0 - 10.5 (K/uL)    RBC 3.91  3.87 - 5.11 (MIL/uL)    Hemoglobin 11.7 (*) 12.0 - 15.0 (g/dL)    HCT 36.5  36.0 - 46.0 (%)    MCV 93.4  78.0 - 100.0 (fL)    MCH 29.9  26.0 - 34.0 (pg)    MCHC 32.1  30.0 - 36.0 (g/dL)    RDW 14.2  11.5 - 15.5 (%)    Platelets 236  150 - 400 (K/uL)   GLUCOSE, CAPILLARY     Status: Abnormal   Collection Time   12/28/10  7:20 AM      Component Value Range Comment   Glucose-Capillary 158 (*) 70 - 99 (mg/dL)   TROPONIN I     Status: Normal   Collection Time   12/28/10  8:05 AM      Component Value Range Comment   Troponin I <0.30  <0.30 (ng/mL)     Brief H and P: For complete details please refer to admission H and P, but in brief Mrs. Abrew is a   Vital Signs at Discharge: BP 123/57  Pulse 72  Temp(Src) 98.4 F (36.9 C) (Oral)  Resp 18  Ht 4\' 11"  (1.499 m)  Wt 71.94 kg (158 lb 9.6 oz)  BMI 32.03 kg/m2  SpO2 99%  Physical Exam: See progress note.     Hospital Course:  Principal Problem:  *Chest pain/ rule out ACS (acute coronary syndrome): Last catheterization on January 2008, showed only a 20% right  coronary artery lesion and normal LV function with an ejection fraction of 60%. Her cardiac enzymes are negative and her pain is somewhat atypical in that it is sharp, intermittent, non-exertional.  Her pro-BNP is not elevated.  Her EKG showed normal sinus rhythm with no abnormalities, no change from prior EKG's.  The  patient admits that she is under stress and thinks her symptoms may be related.  We will prescribe valium at discharge.  She has been fully instructed on the safe use of this medication, and it's addictive potential.   Active Problems:  Diabetes mellitus: CBG's 74-184 over past 24 hours.  Continue home medications and follow up with PCP.  Hypertension: Controlled.  A beta blocker was added to her regimen.  Hyperlipidemia: Controlled.  Cholesterol is 137 and LDL is 75.    GERD (gastroesophageal reflux disease): Continue PPI therapy.  Asthma: Stable.    Time spent on Discharge: 35 minutes.  Signed: RAMA,CHRISTINA 12/28/2010, 10:05 AM

## 2010-12-28 NOTE — Plan of Care (Signed)
Problem: Phase II Progression Outcomes Goal: Anginal pain relieved Outcome: Adequate for Discharge Pt still having intermittent pain, MD aware, pt to be discharged with medications and MD r/o cardiac issues.  Problem: Phase III Progression Outcomes Goal: No anginal pain Outcome: Adequate for Discharge See phase 2 note  Problem: Discharge Progression Outcomes Goal: No anginal pain Outcome: Adequate for Discharge See phase 2 note

## 2010-12-28 NOTE — Progress Notes (Signed)
Pt given d/c instructions, pt verbalized understanding, f/u appointment in place and pt aware to call for f/u appointment with cardiologist, number provided, prescriptions given, med education done, pt verbalized understanding

## 2011-03-05 DIAGNOSIS — I10 Essential (primary) hypertension: Secondary | ICD-10-CM | POA: Diagnosis not present

## 2011-03-05 DIAGNOSIS — E119 Type 2 diabetes mellitus without complications: Secondary | ICD-10-CM | POA: Diagnosis not present

## 2011-03-25 DIAGNOSIS — E7800 Pure hypercholesterolemia, unspecified: Secondary | ICD-10-CM | POA: Diagnosis not present

## 2011-03-25 DIAGNOSIS — I10 Essential (primary) hypertension: Secondary | ICD-10-CM | POA: Diagnosis not present

## 2011-04-01 ENCOUNTER — Encounter: Payer: Medicare Other | Attending: Endocrinology | Admitting: *Deleted

## 2011-04-01 ENCOUNTER — Encounter: Payer: Self-pay | Admitting: *Deleted

## 2011-04-01 DIAGNOSIS — E119 Type 2 diabetes mellitus without complications: Secondary | ICD-10-CM | POA: Insufficient documentation

## 2011-04-01 DIAGNOSIS — Z713 Dietary counseling and surveillance: Secondary | ICD-10-CM | POA: Diagnosis not present

## 2011-04-01 NOTE — Progress Notes (Signed)
  Medical Nutrition Therapy:  Appt start time: 1000 end time:  1100.   Assessment:  Primary concerns today: Patient here for nutrition counseling and diabetes education. She has history of DM2 for almost 50 years and is currently taking Novolog 70/30 @ 75 units in AM and 55 units pre-supper. She states no history of diabetes education and is not knowledgeable of Carb Counting or action times of her insulin. She is testing her BG 4-6 times a day due to frequency of hypoglycemia and states she has to pay a majority of the cost of the strips. 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   MEDICATIONS: see list. Diabetes medication is Novolog 70/30 BID   DIETARY INTAKE:  Usual eating pattern includes 3 meals and 0-2 snacks per day.  Everyday foods include good variety of all food groups.  Avoided foods include sweets and fried foods.    24-hr recall:  B ( AM): 1 egg with [place] bacon, 1 slice toast, 12 oz OJ, coffee Splenda and milk, OR pancakes, egg, [place] bacon, SF syrup, butter OR unsweetened cereal with milk, (has sweet cereal to treat low BG) Snk ( AM): none unless nabs and OJ, or mint candy L ( PM): home; sandwich with chips OR left overs with 12 oz OJ or water Snk ( PM): none D ( PM): home; [employer] take out, chicken or fish, starch, vegetable, 12 oz OJ Snk ( PM): PNB crackers 2-3, 12 oz OJ Beverages: OJ, coffee, water  Usual physical activity: 12 steps x 2 at her home daily  Estimated energy needs: 1400 calories 158 g carbohydrates 105 g protein 39 g fat  Progress Towards Goal(s):  In progress.   Nutritional Diagnosis:  NI-1.5 Excessive energy intake As related to weight loss.  As evidenced by BMI of 32.2 %.    Intervention:  Nutrition counseling and diabetes education provided. Discussed basic physiology of Diabetes, carb counting and rationale for consistent carb intake for more stable BGs.  Patient  discovered high carb content of OJ, which she drinks throughout the day and to treat hypoglycemia. She now understands that the 12 oz glass is equal to 3 Carb Choices (45 grams) and that is excessive. She now plans to use 4 oz OJ as a Carb Choice and for treatment of her low BGs. I cautioned her that with this significant decrease in carb intake and it's expected effect on her BG, she needs to contact her MD office for instructions on any insulin dose reduction needed. Plan: Call insurance company to check on preferred meter and strips for best coverage Call Dr Chalmers Cater to send Rx for testing 4 to 6 times per day to improve coverage also Aim for 3 Carb Choices per meal, 0-1 per snack if hungry Limit OJ to 4 oz as a Carb choice OR as a treatment for low BG Follow up to continue carb counting, discuss insulin actions and how to read food labels  Handouts given during visit include:  Living Well with Diabetes  Carb Counting and Reading Food Label handouts  Meal Planning Card  Monitoring/Evaluation:  Dietary intake, exercise, reading food labels, and body weight in 1 week(s).

## 2011-04-01 NOTE — Patient Instructions (Signed)
Plan: Call insurance company to check on preferred meter and strips for best coverage Call Dr Chalmers Cater to send Rx for testing 4 to 6 times per day Aim for 3 Carb Choices per meal, 0-1 per snack if hungry Limit OJ to 4 oz as a Carb choice OR as a treatment for low BG Follow up to continue carb counting, discuss insulin actions and how to read food labels

## 2011-04-10 ENCOUNTER — Encounter: Payer: Medicare Other | Admitting: *Deleted

## 2011-04-10 NOTE — Patient Instructions (Signed)
Plan: Call insurance company to check on preferred meter and strips for best coverage Call Dr Chalmers Cater to send Rx for testing 4 to 6 times per day to improve coverage also Call Dr Chalmers Cater to let her know that your BG are lower and how much would she like to lower your insulin doses? Continue to aim for 3 Carb Choices per meal, 0-1 per snack if hungry Limit OJ to 4 -8 oz as a Carb choice OR as a treatment for low BG

## 2011-04-10 NOTE — Progress Notes (Signed)
  Medical Nutrition Therapy:  Appt start time: 1000 end time:  1100.   Assessment:  Primary concerns today: Patient here for follow up visit for diabetes education. She continues to take her insulin as directed and has cut back on quantity of OJ daily. She has not looked into the insurance coverage for strips or for her Rx to be increased from MD for better coverage of strips yet. She is more aware of the carbohydrate containing foods and is able to read food labels too.  MEDICATIONS: see list. Diabetes medication is Novolog 70/30 BID   DIETARY INTAKE:  Usual eating pattern includes 3 meals and 0-2 snacks per day.  Everyday foods include good variety of all food groups.  Avoided foods include sweets and fried foods.    24-hr recall:  B ( AM): 1 egg with [place] bacon, 1 slice toast, 8 oz OJ, coffee Splenda and milk, OR pancakes, egg, [place] bacon, SF syrup, butter OR unsweetened cereal with milk, (has sweet cereal to treat low BG) Snk ( AM): none unless nabs and OJ, or mint candy L ( PM): home; sandwich with chips OR left overs with 8 oz OJ or water Snk ( PM): none D ( PM): home; [employer] take out, chicken or fish, starch, vegetable, 8 oz OJ Snk ( PM): PNB crackers 2-3, 4-8 oz OJ Beverages: OJ, coffee, water  Usual physical activity: 12 steps x 2 at her home daily. No change since last visit  Estimated energy needs: 1400 calories 158 g carbohydrates 105 g protein 39 g fat  Progress Towards Goal(s):  In progress.   Nutritional Diagnosis:  NI-1.5 Excessive energy intake As related to weight loss.  As evidenced by BMI of 32.2 %.    Intervention:   Commended her on her decrease in OJ and other high carb foods. Reviewed importance of verifying insurance requirements for best coverage for strips and that she needs to communicate with MD to decrease insulin doses if she has hypoglycemia. Plan to review insulin action at next visit  Plan: Call insurance company to check on preferred meter  and strips for best coverage Call Dr Chalmers Cater to send Rx for testing 4 to 6 times per day to improve coverage also Continue to aim for 3 Carb Choices per meal, 0-1 per snack if hungry Continue to limit OJ to 4 oz as a Carb choice OR as a treatment for low BG Follow up to discuss insulin actions   Monitoring/Evaluation:  Dietary intake, exercise, reading food labels, and body weight in 2 week(s).

## 2011-04-12 ENCOUNTER — Encounter: Payer: Self-pay | Admitting: *Deleted

## 2011-05-28 ENCOUNTER — Ambulatory Visit: Payer: Medicare Other | Admitting: *Deleted

## 2011-06-04 ENCOUNTER — Encounter (INDEPENDENT_AMBULATORY_CARE_PROVIDER_SITE_OTHER): Payer: Medicare Other | Admitting: Ophthalmology

## 2011-06-04 DIAGNOSIS — I10 Essential (primary) hypertension: Secondary | ICD-10-CM

## 2011-06-04 DIAGNOSIS — E1139 Type 2 diabetes mellitus with other diabetic ophthalmic complication: Secondary | ICD-10-CM | POA: Diagnosis not present

## 2011-06-04 DIAGNOSIS — N189 Chronic kidney disease, unspecified: Secondary | ICD-10-CM | POA: Diagnosis not present

## 2011-06-04 DIAGNOSIS — E11319 Type 2 diabetes mellitus with unspecified diabetic retinopathy without macular edema: Secondary | ICD-10-CM | POA: Diagnosis not present

## 2011-06-04 DIAGNOSIS — H3530 Unspecified macular degeneration: Secondary | ICD-10-CM

## 2011-06-04 DIAGNOSIS — E1165 Type 2 diabetes mellitus with hyperglycemia: Secondary | ICD-10-CM | POA: Diagnosis not present

## 2011-06-04 DIAGNOSIS — E119 Type 2 diabetes mellitus without complications: Secondary | ICD-10-CM | POA: Diagnosis not present

## 2011-06-04 DIAGNOSIS — H35039 Hypertensive retinopathy, unspecified eye: Secondary | ICD-10-CM | POA: Diagnosis not present

## 2011-06-04 DIAGNOSIS — M1990 Unspecified osteoarthritis, unspecified site: Secondary | ICD-10-CM | POA: Diagnosis not present

## 2011-06-04 DIAGNOSIS — H43819 Vitreous degeneration, unspecified eye: Secondary | ICD-10-CM

## 2011-06-04 DIAGNOSIS — E1129 Type 2 diabetes mellitus with other diabetic kidney complication: Secondary | ICD-10-CM | POA: Diagnosis not present

## 2011-06-16 IMAGING — CT CT PELVIS W/ CM
2 of 5 series · 17 of 46 positions shown, 19 images · IV contrast (water & 100 ML OMNI 300)
Comparison: 04/22/2000 by report only

CT ABDOMEN

CLINICAL DATA: Low abdominal pain, nausea

CT ABDOMEN AND PELVIS WITH CONTRAST
TECHNIQUE: Multidetector CT imaging of the abdomen and pelvis was
performed using the standard protocol following bolus
administration of intravenous contrast.
Contrast: 100 ml Xmnipaque-3WW IV

[Series 2: routine abdomen · axial · 0.78mm/px · z∈[-384,-39]mm · 14 of 79 slices shown, 16 images]
[im 5/79  soft-tissue]
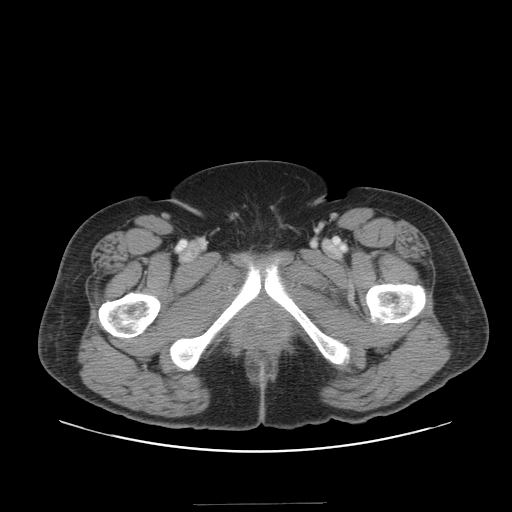
[im 5/79  bone]
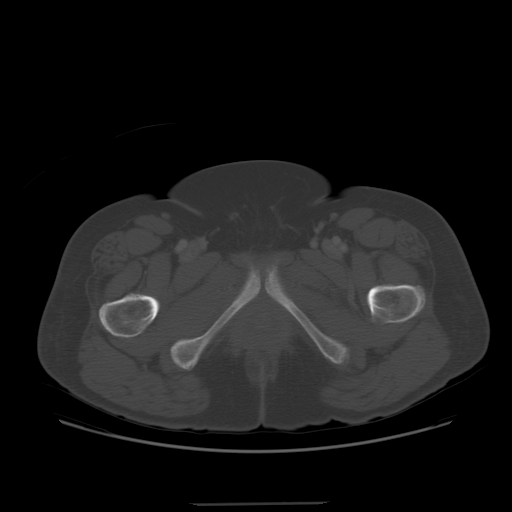
[im 9/79  soft-tissue]
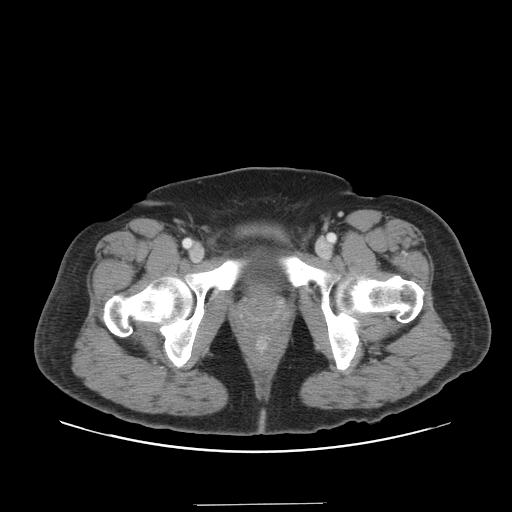
[im 18/79  soft-tissue]
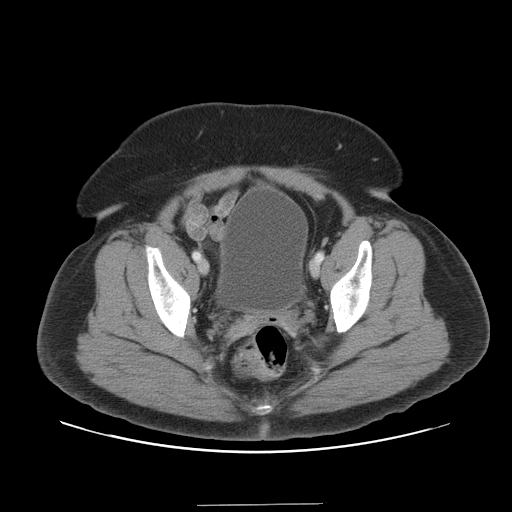
[im 22/79  soft-tissue]
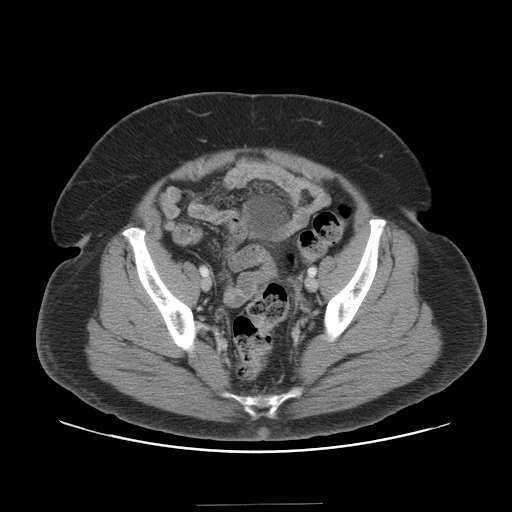
[im 27/79  soft-tissue]
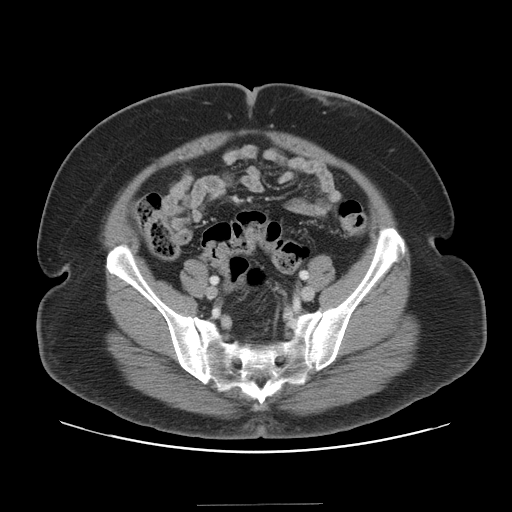
[im 31/79  soft-tissue]
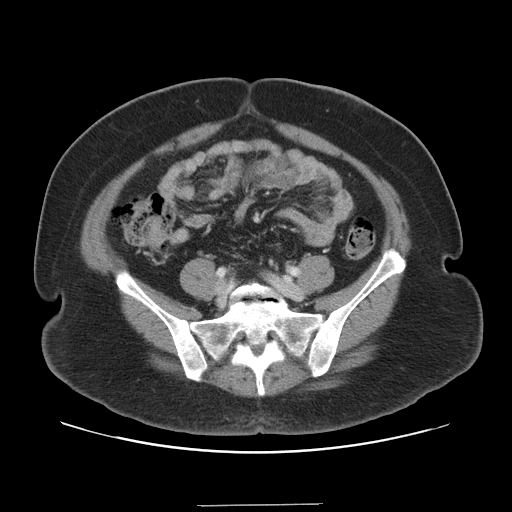
[im 35/79  soft-tissue]
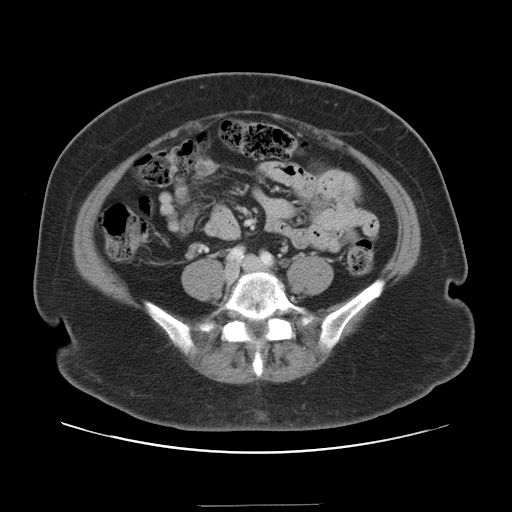
[im 44/79  soft-tissue]
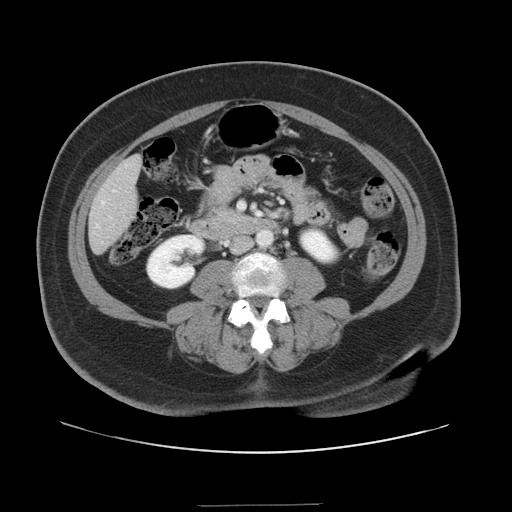
[im 48/79  soft-tissue]
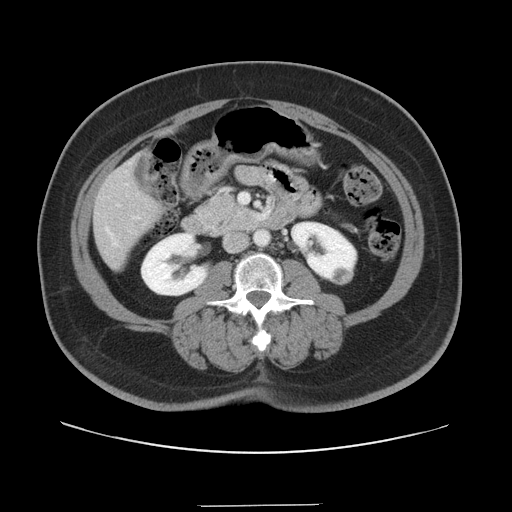
[im 48/79  bone]
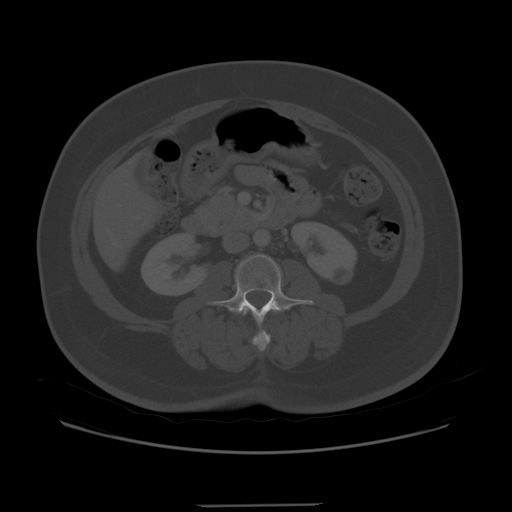
[im 53/79  soft-tissue]
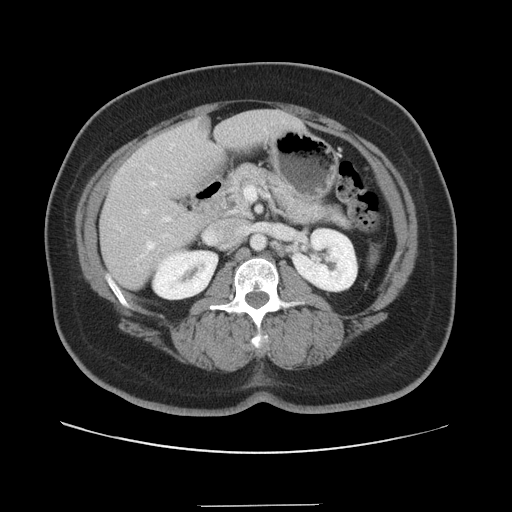
[im 57/79  soft-tissue]
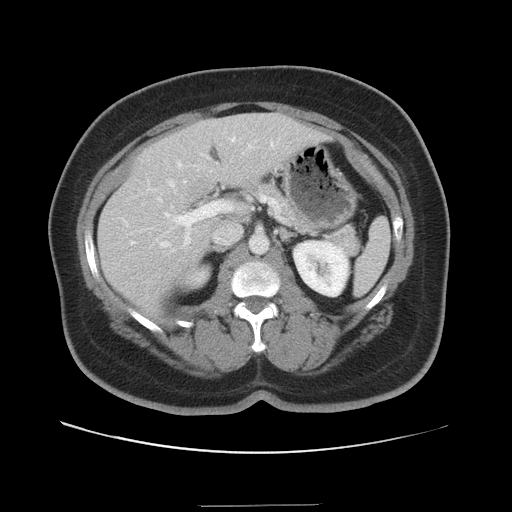
[im 61/79  soft-tissue]
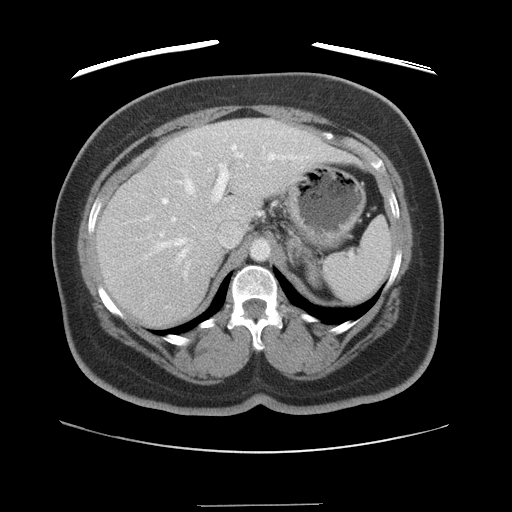
[im 70/79  soft-tissue]
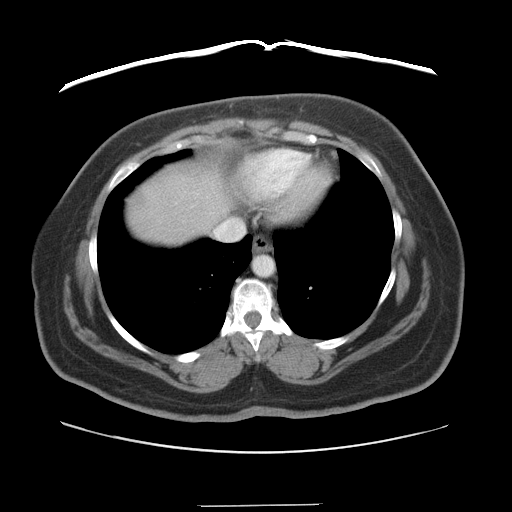
[im 74/79  soft-tissue]
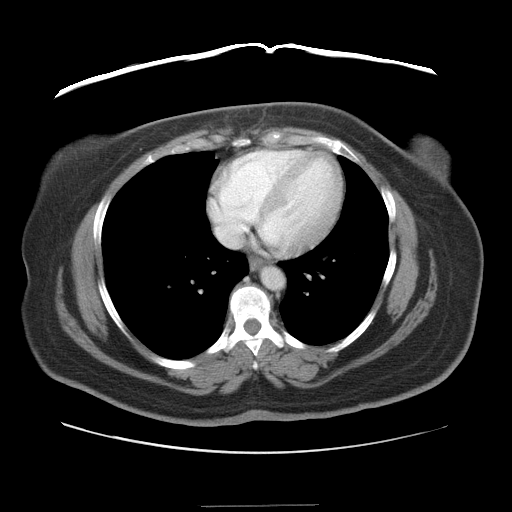

[Series 401: cor · coronal · 0.78mm/px · 3 of 98 slices shown]
[im 33/98  soft-tissue]
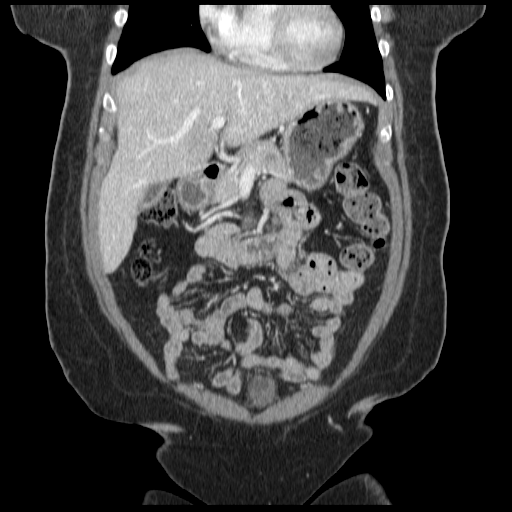
[im 44/98  soft-tissue]
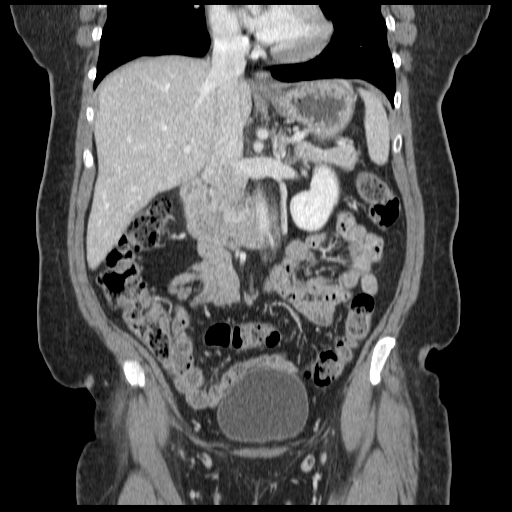
[im 54/98  soft-tissue]
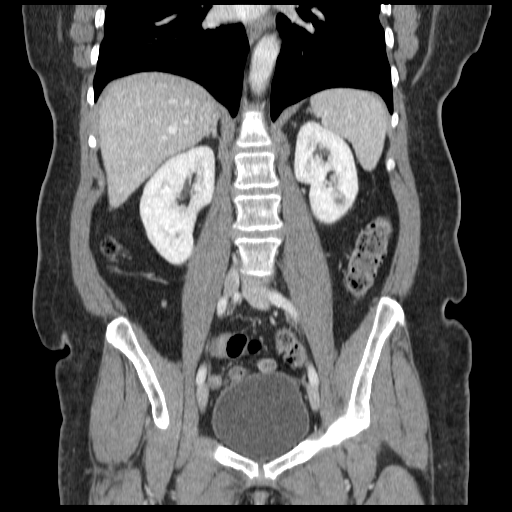

[17 of 46 positions shown; findings below may reference images not displayed]

FINDINGS: Visualized lung bases clear.  Unremarkable liver,
nondistended gallbladder, spleen, adrenal glands, right kidney.
There is a 15 mm complex cystic partially   exophytic lesion in the
interpolar region of the left kidney.  A 1 cm complex lesion was
described on the prior study.  Unremarkable pancreas and aorta.
Portal vein patent.  Small bowel decompressed.  No free air.  No
ascites.  Degenerative disc disease L5-S1 and facet degenerative
changes L3-4, L4-5.
IMPRESSION: 1.  No acute abdominal process.
2.  Complex 15 mm cystic left renal lesion which is reportedly
increased in size since the CT of 04/22/2000.   Further evaluation
with pre and post contrast MRI should be considered.  Pre and post
contrast CT could alternatively be performed, but would likely be
of decreased accuracy given lesion size.

CT PELVIS
FINDINGS: The colon is nondilated.  Urinary bladder
physiologically distended.  Bilateral pelvic phleboliths.  No free
fluid.  No adenopathy.  Small umbilical hernia containing only
mesenteric fat.
IMPRESSION: 1.  No acute pelvic process.

## 2011-07-22 DIAGNOSIS — J45909 Unspecified asthma, uncomplicated: Secondary | ICD-10-CM | POA: Diagnosis not present

## 2011-07-23 DIAGNOSIS — E7800 Pure hypercholesterolemia, unspecified: Secondary | ICD-10-CM | POA: Diagnosis not present

## 2011-07-23 DIAGNOSIS — R5381 Other malaise: Secondary | ICD-10-CM | POA: Diagnosis not present

## 2011-07-23 DIAGNOSIS — I10 Essential (primary) hypertension: Secondary | ICD-10-CM | POA: Diagnosis not present

## 2011-08-12 IMAGING — CR DG CHEST 2V
2 series · 2 of 2 positions shown · non-contrast
Comparison: 05/15/2007

CLINICAL DATA: Left renal mass.  Preop workup.  Hypertension.
Diabetes.

CHEST - 2 VIEW

[w chest pa]
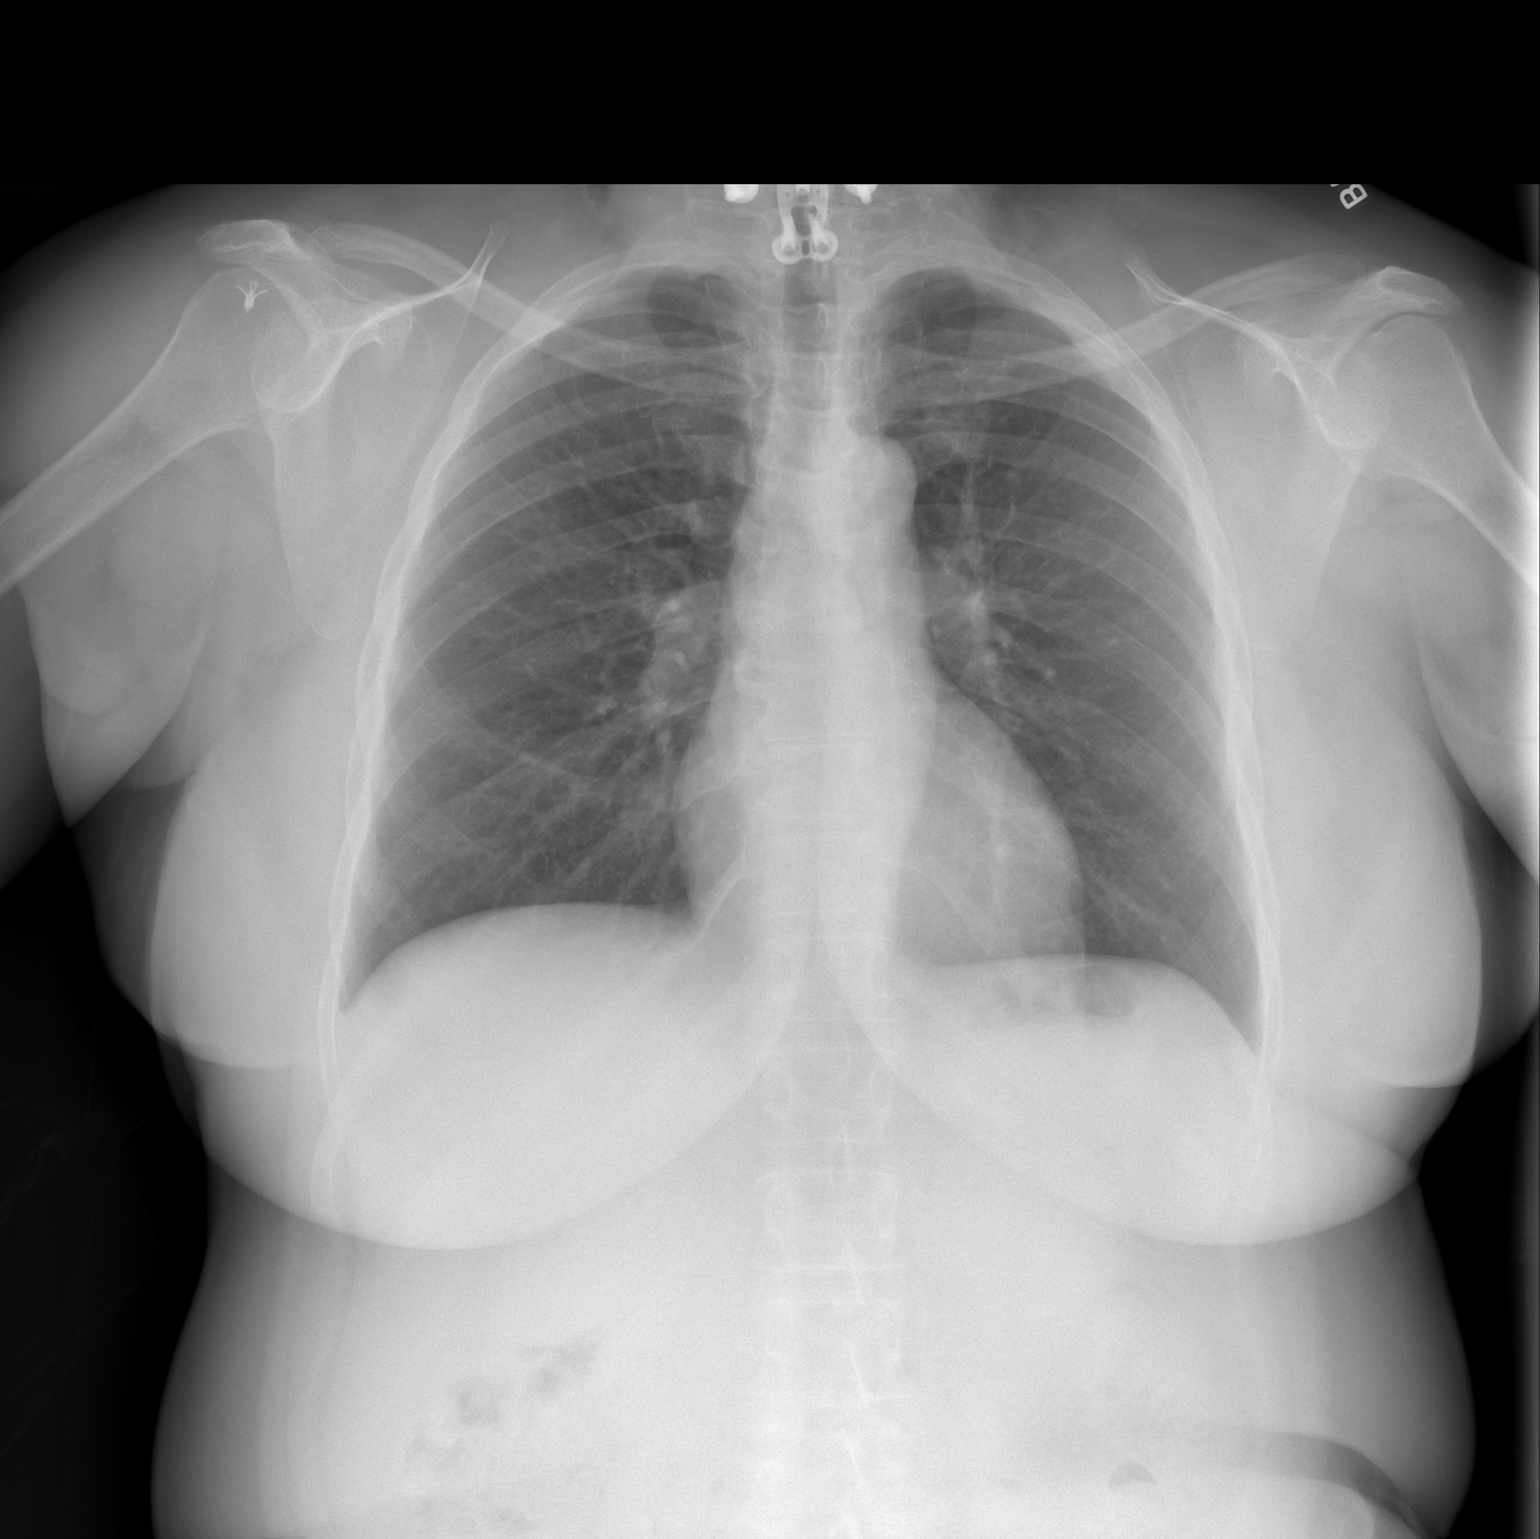

[w chest lat]
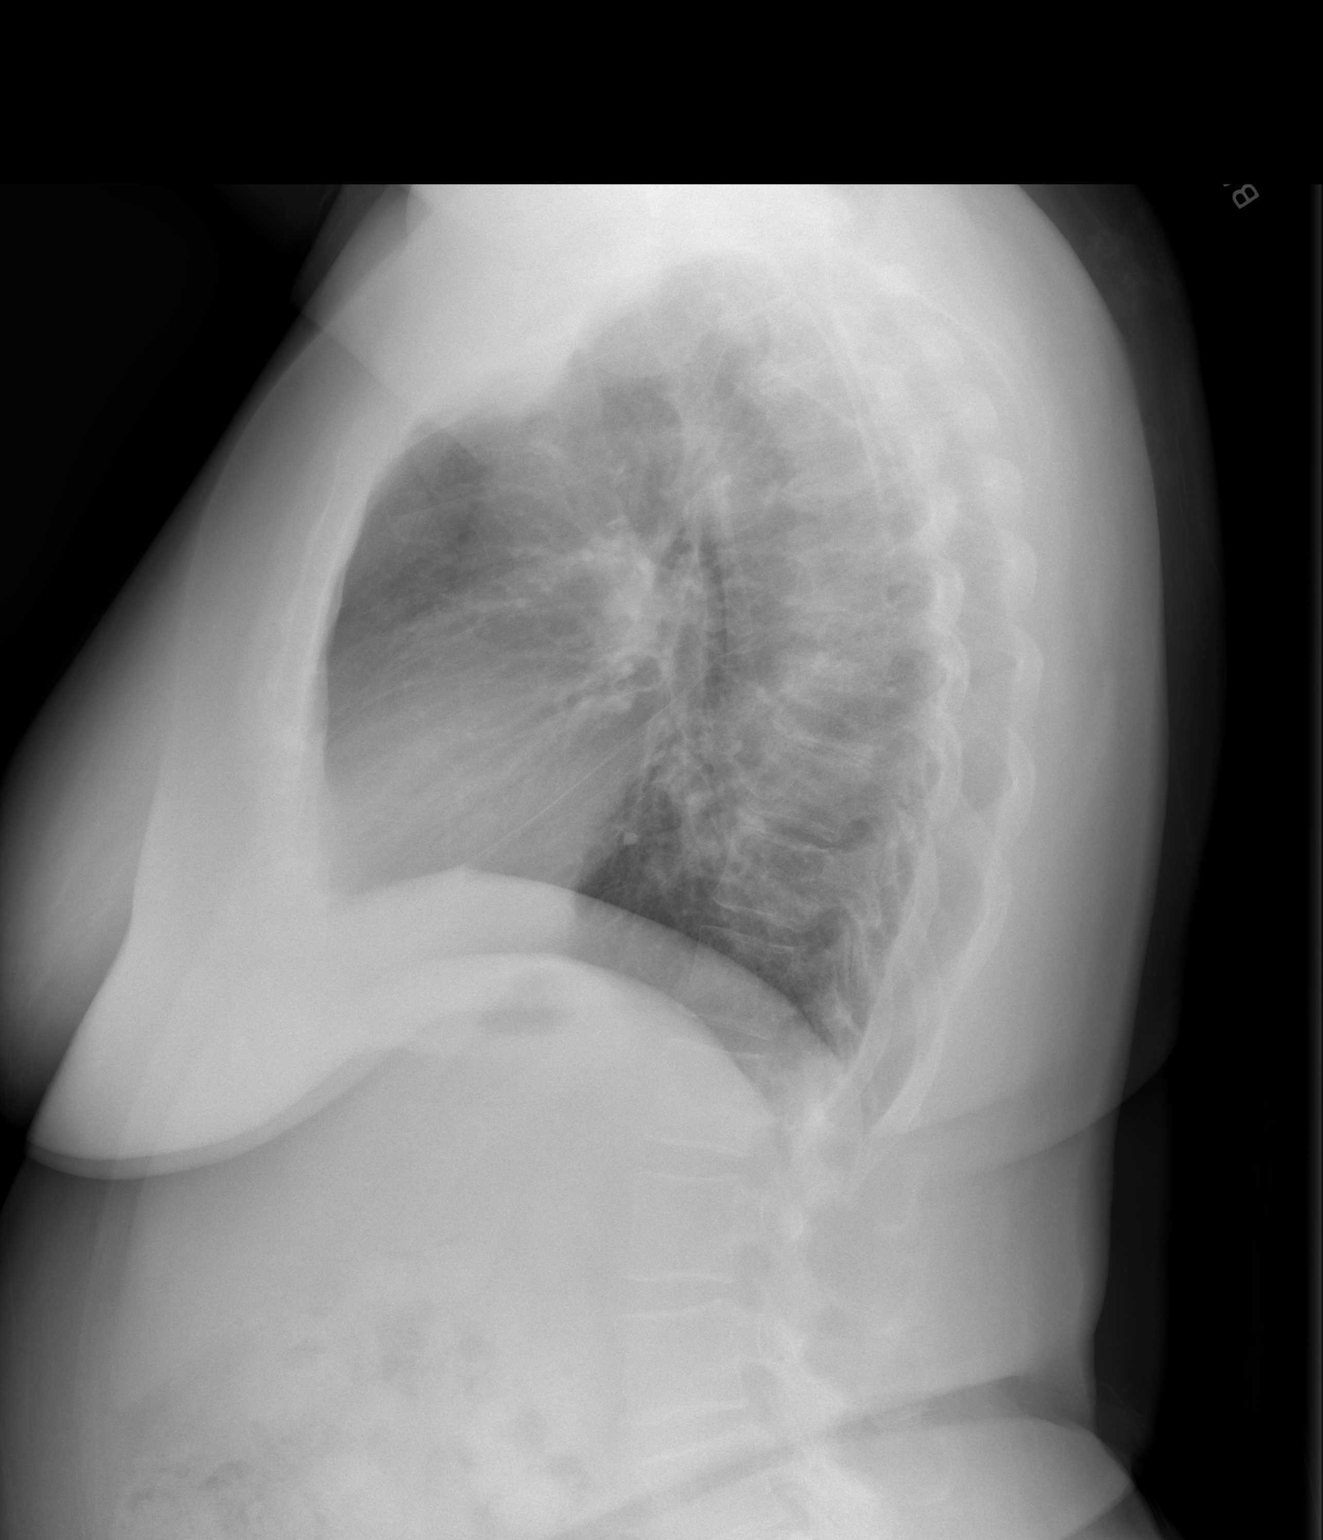

[2 of 2 positions shown; findings below may reference images not displayed]

FINDINGS: The heart size and mediastinal contours are within
normal limits.  Both lungs are clear.  The visualized skeletal
structures are unremarkable.Cervical fusion hardware is noted.
IMPRESSION: No active cardiopulmonary disease.

## 2011-08-25 IMAGING — CT CT ANGIO CHEST
2 of 10 series · 16 of 46 positions shown · IV contrast (APPLIED)
Comparison: CT of the abdomen and pelvis performed 08/04/2008, and
CTA of the chest performed 03/10/2006

CTA CHEST

CLINICAL DATA: Shortness of breath status post partial left renal
resection.  Elevated D-dimer.  Pain at surgical site; evaluate for
abscess.

CT ANGIOGRAPHY CHEST, AND CT ABDOMEN AND PELVIS WITH CONTRAST
TECHNIQUE: Multidetector CT imaging of the chest was performed
using the standard protocol during bolus administration of
intravenous contrast. Multiplanar CT image reconstructions
including MIPs were obtained to evaluate the vascular anatomy.
Multidetector CT imaging of the abdomen and pelvis was performed
intravenous contrast.
Contrast: 100 mL of Omnipaque 300 IV contrast

[Series 8: thins · axial · 0.57mm/px · z∈[-400,-230]mm · 15 of 196 slices shown]
[im 13/196  lung]
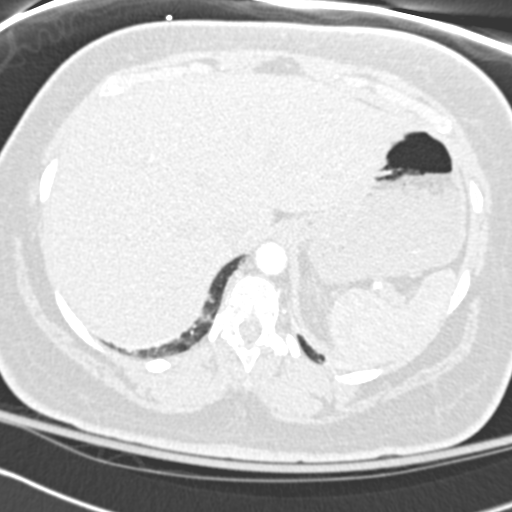
[im 25/196  soft-tissue]
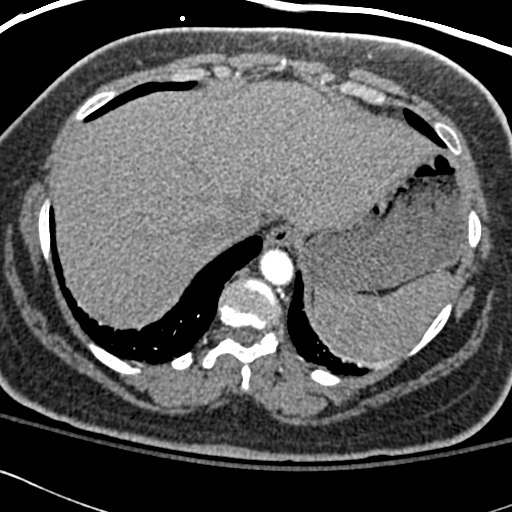
[im 37/196  lung]
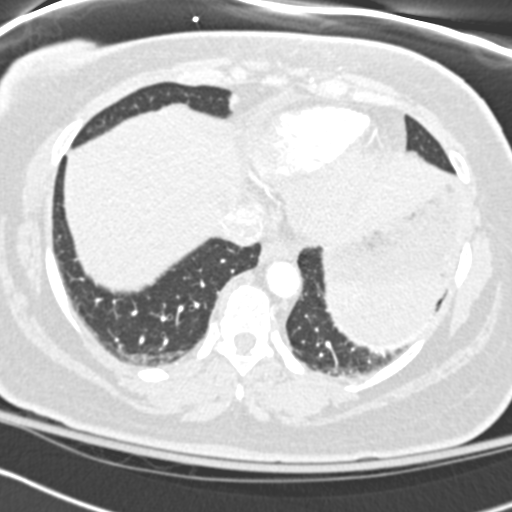
[im 49/196  soft-tissue]
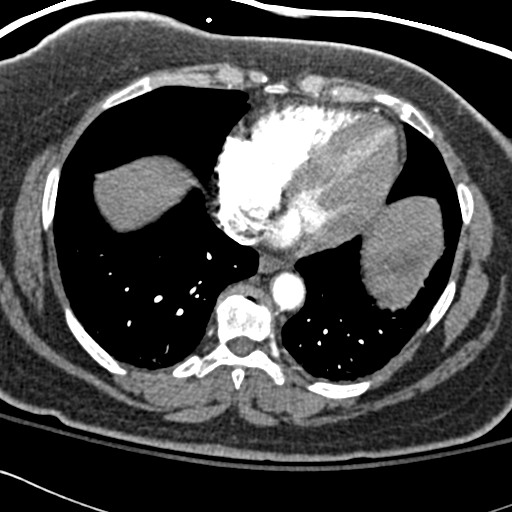
[im 61/196  lung]
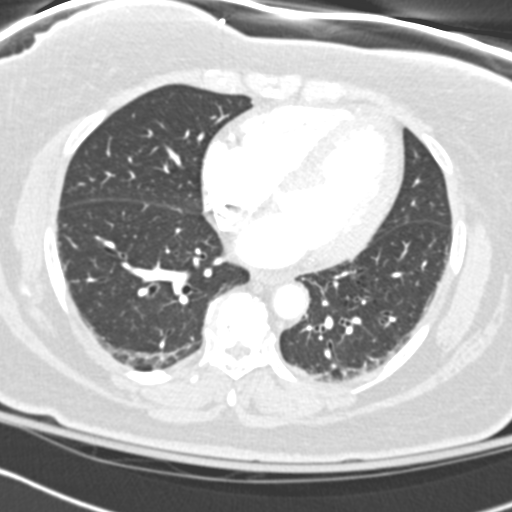
[im 74/196  soft-tissue]
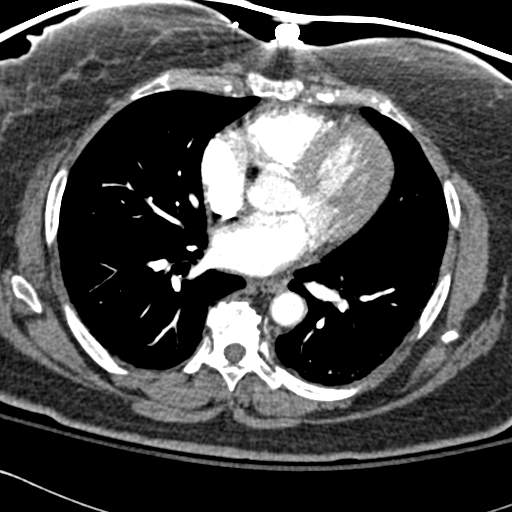
[im 86/196  lung]
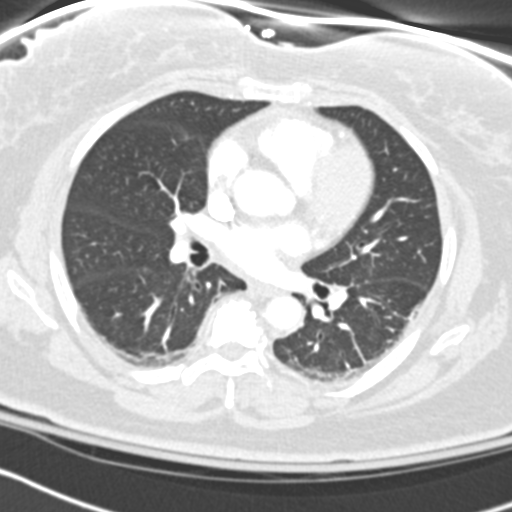
[im 98/196  soft-tissue]
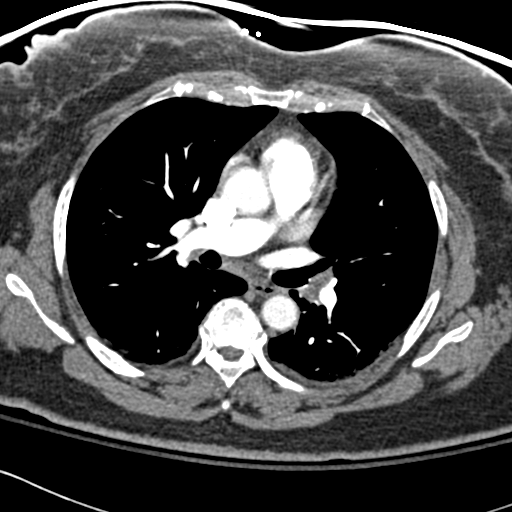
[im 110/196  lung]
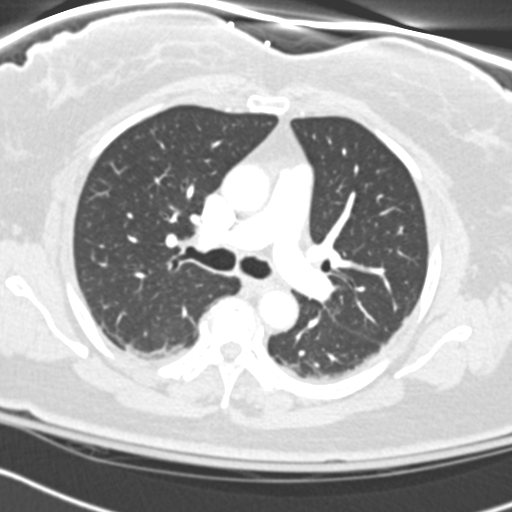
[im 122/196  soft-tissue]
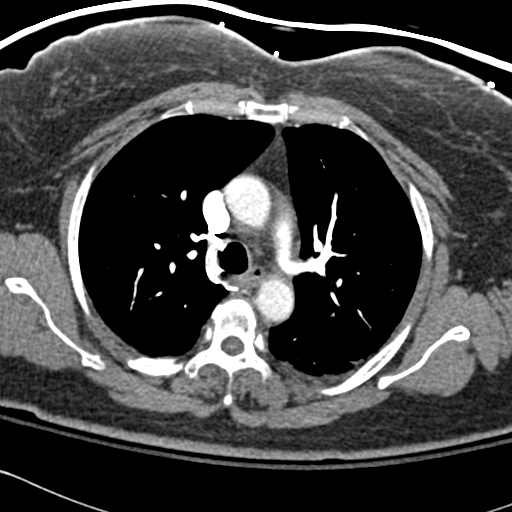
[im 135/196  lung]
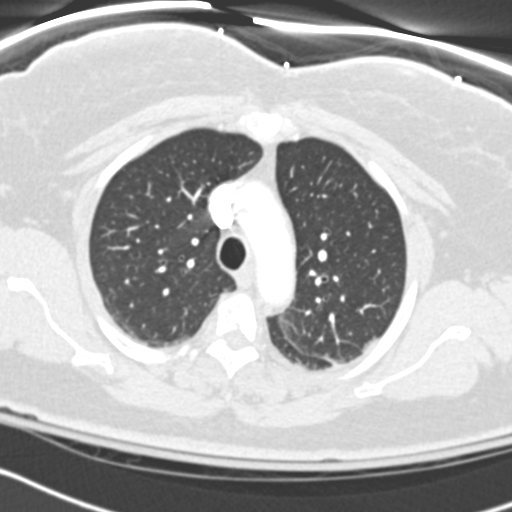
[im 147/196  soft-tissue]
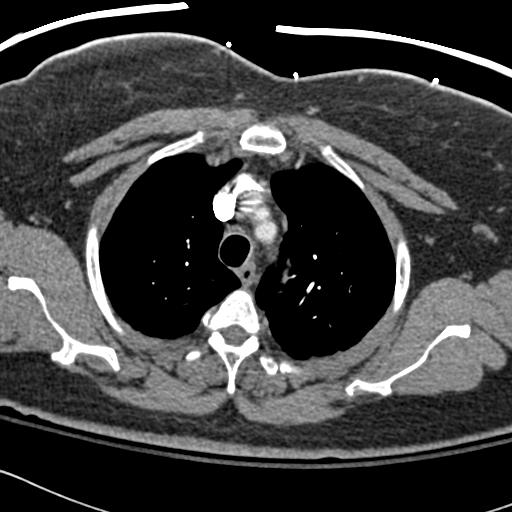
[im 159/196  lung]
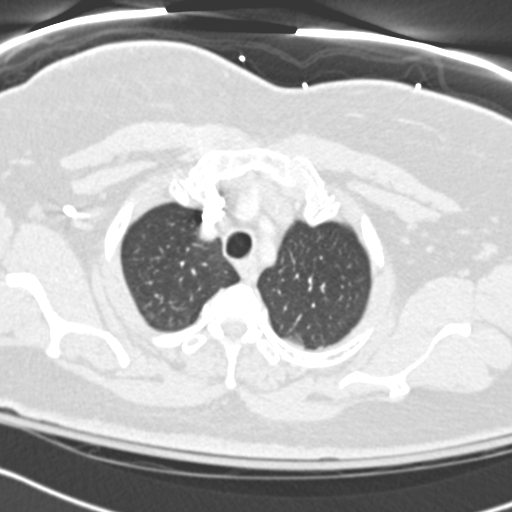
[im 171/196  soft-tissue]
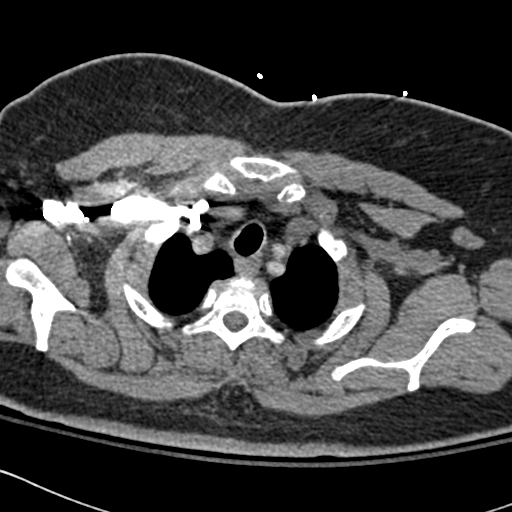
[im 183/196  lung]
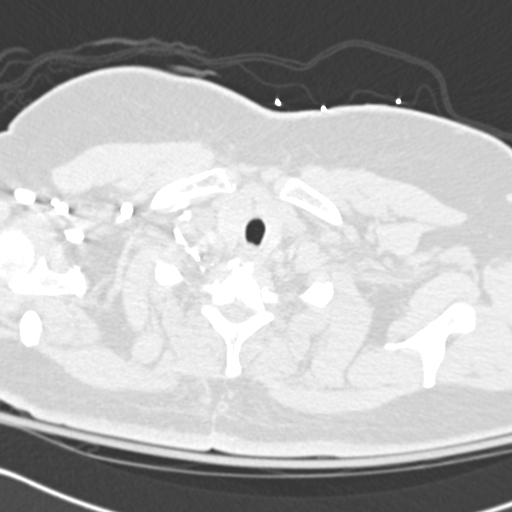

[Series 602: <mpr thick range> · coronal · 0.57mm/px · 1 of 89 slices shown]
[im 45/89  soft-tissue]
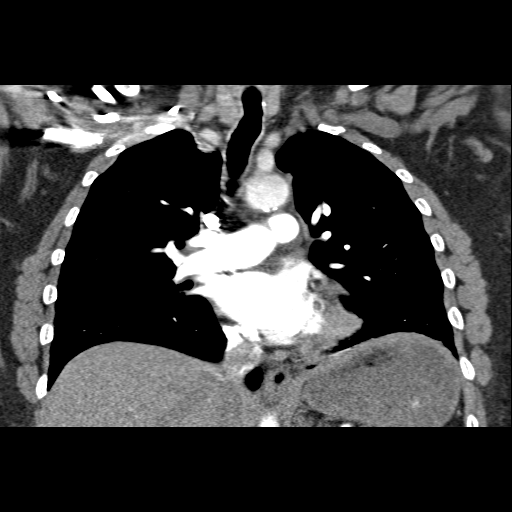

[16 of 46 positions shown; findings below may reference images not displayed]

FINDINGS: There is no evidence of pulmonary embolus.

Mild bilateral dependent atelectasis is noted.  There is no
evidence of significant focal consolidation, pleural effusion or
pneumothorax.  No masses are identified; no abnormal focal contrast
enhancement is seen.

The mediastinum is unremarkable in appearance.  No mediastinal
lymphadenopathy is evident.  No axillary lymphadenopathy is seen.
The thyroid gland is unremarkable in appearance.

The visualized portions of the liver and spleen are unremarkable.
No acute osseous abnormalities are seen. Anterior and posterior
spinal fusion hardware is noted along the lower cervical spine.
There is evidence of prior right-sided rotator cuff repair.

 Review of the MIP images confirms the above findings.
IMPRESSION: 1.  No evidence of pulmonary embolus.
2.  Mild bilateral dependent atelectasis noted.

CT ABDOMEN
FINDINGS: The visualized lung bases are clear.

The liver and spleen are unremarkable in appearance.  The
gallbladder is within normal limits.  The pancreas and adrenal
glands are unremarkable.

The patient is status post partial left renal resection, involving
the posterior aspect of the left kidney.  At the site of resection,
there is a mildly complex defined fluid collection measuring 3.7 x
3.3 cm, which contains several foci of air.  Although the air may
be postoperative in nature, and there is no evidence of significant
peripheral enhancement, this raises concern for an early
postoperative abscess.  Alternatively, this could reflect a small
hematoma at the site of surgery.

No additional fluid is noted along the left paracolic gutter, with
mild stranding in the surrounding fat.  Soft tissue stranding is
noted within the anterior abdominal wall at the site of trocar
insertions, on the left side of the abdomen and at the midline.  No
associated fluid collection is seen to suggest an anterior
abdominal wall abscess.

The right kidney is normal in appearance.  The small bowel is
unremarkable in appearance.  The stomach is within normal limits.
No acute vascular abnormalities are seen.

No acute osseous abnormalities are identified.
IMPRESSION: 1.  Status post partial left renal resection; mildly complex
defined fluid collection noted at the resection site, measuring
x 3.3 cm and containing several foci of air.  This raises concern
for early postoperative abscess, although there is no significant
peripheral enhancement.  Alternatively, this could reflect a small
hematoma.
2.  Soft tissue stranding noted in the anterior abdominal wall at
the site of trocar insertions, on the left side of the abdomen and
at the midline.

CT PELVIS
FINDINGS: The colon is unremarkable in appearance.  The patient is
status post appendectomy.  No free fluid is noted within the
pelvis.  The bladder is mildly distended and unremarkable in
appearance.  The patient is status post hysterectomy; the adnexa
are within normal limits.  No significant inguinal lymphadenopathy
is seen.

No acute osseous abnormalities are identified.  Mild degenerative
change is noted at the lower lumbar spine.
IMPRESSION: Unremarkable CT of the pelvis.

## 2011-08-25 IMAGING — CR DG CHEST 2V
2 series · 2 of 2 positions shown · non-contrast
Comparison: Chest radiograph performed 09/30/2008

CLINICAL DATA: Shortness of breath and new onset abdominal pain.

CHEST - 2 VIEW

[w chest lat]
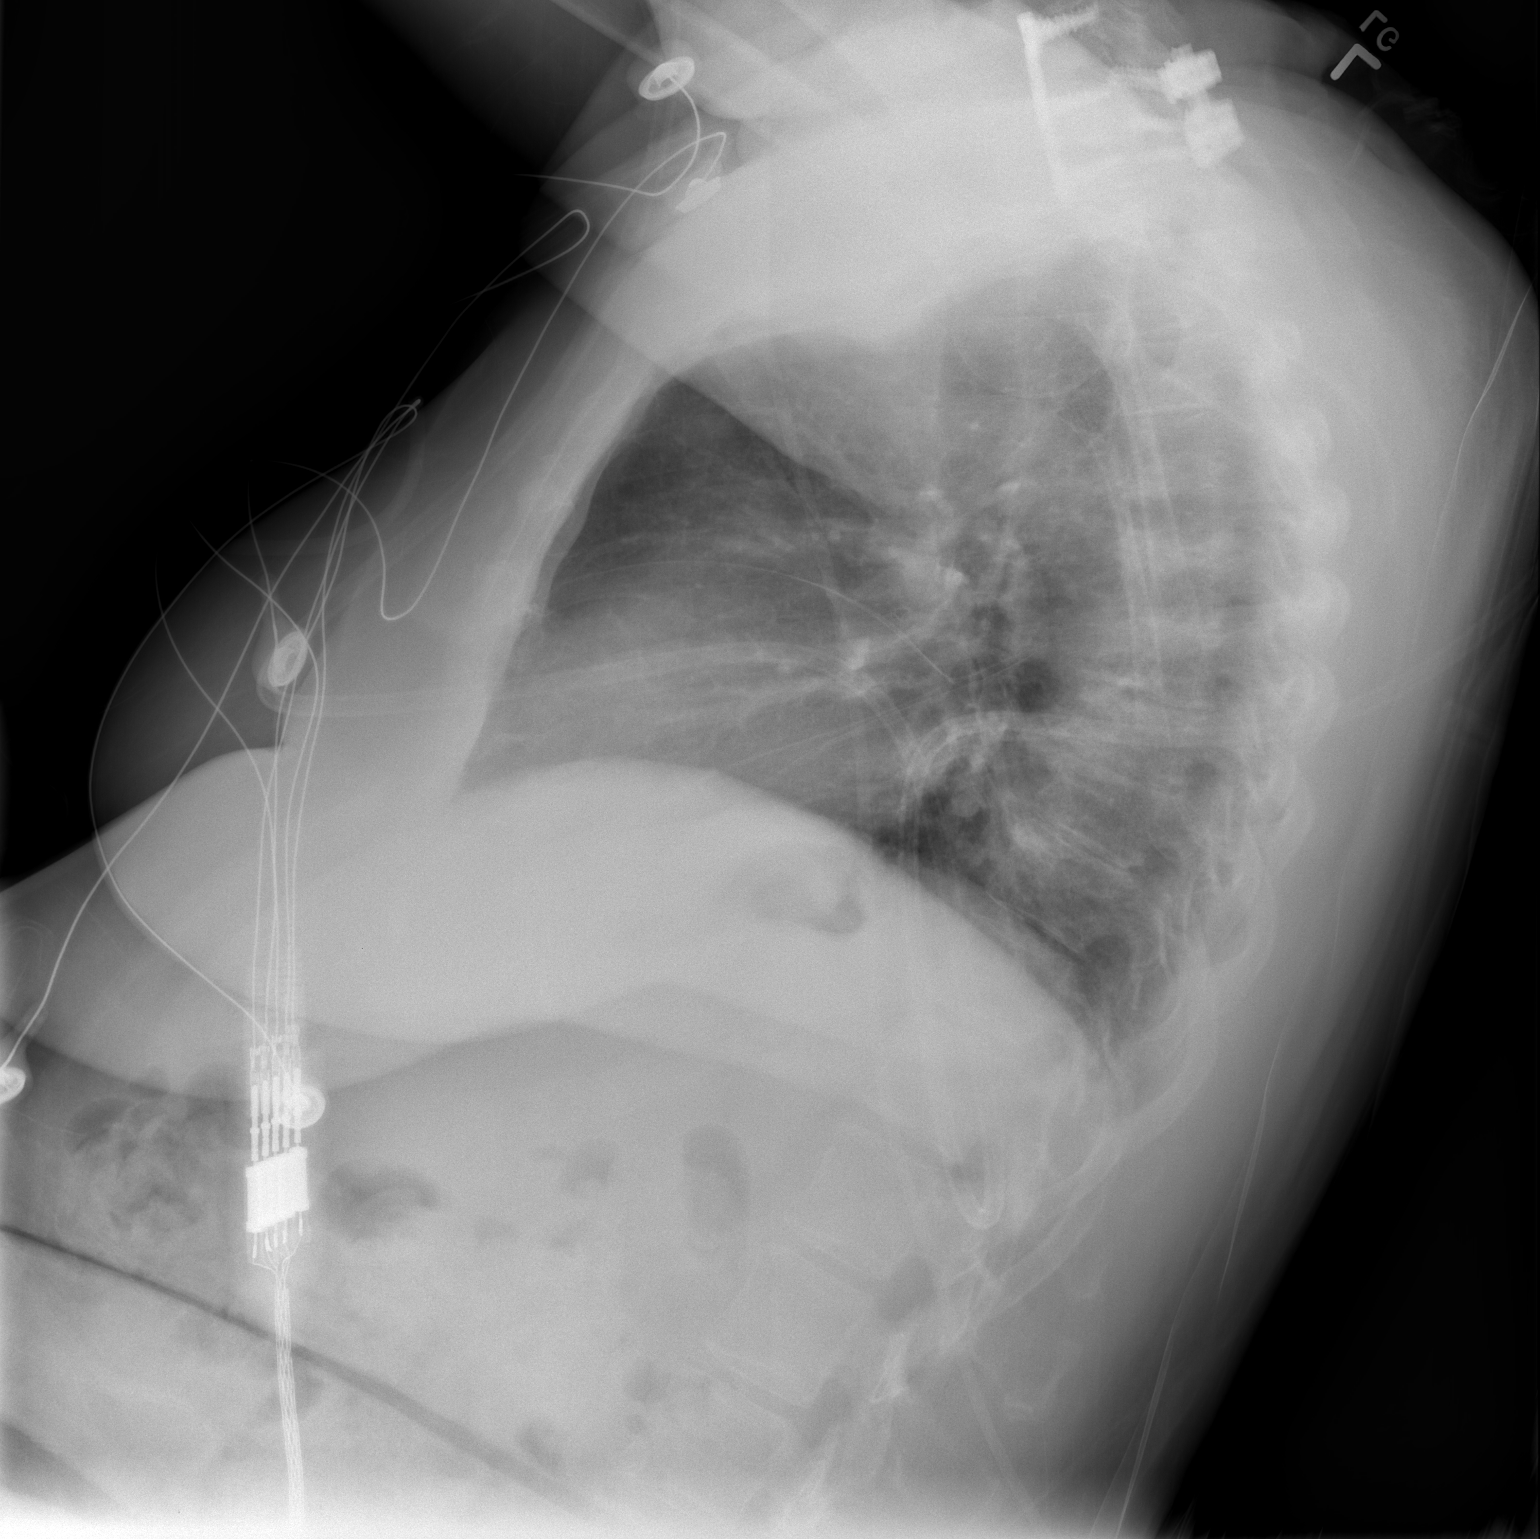

[view not recorded]
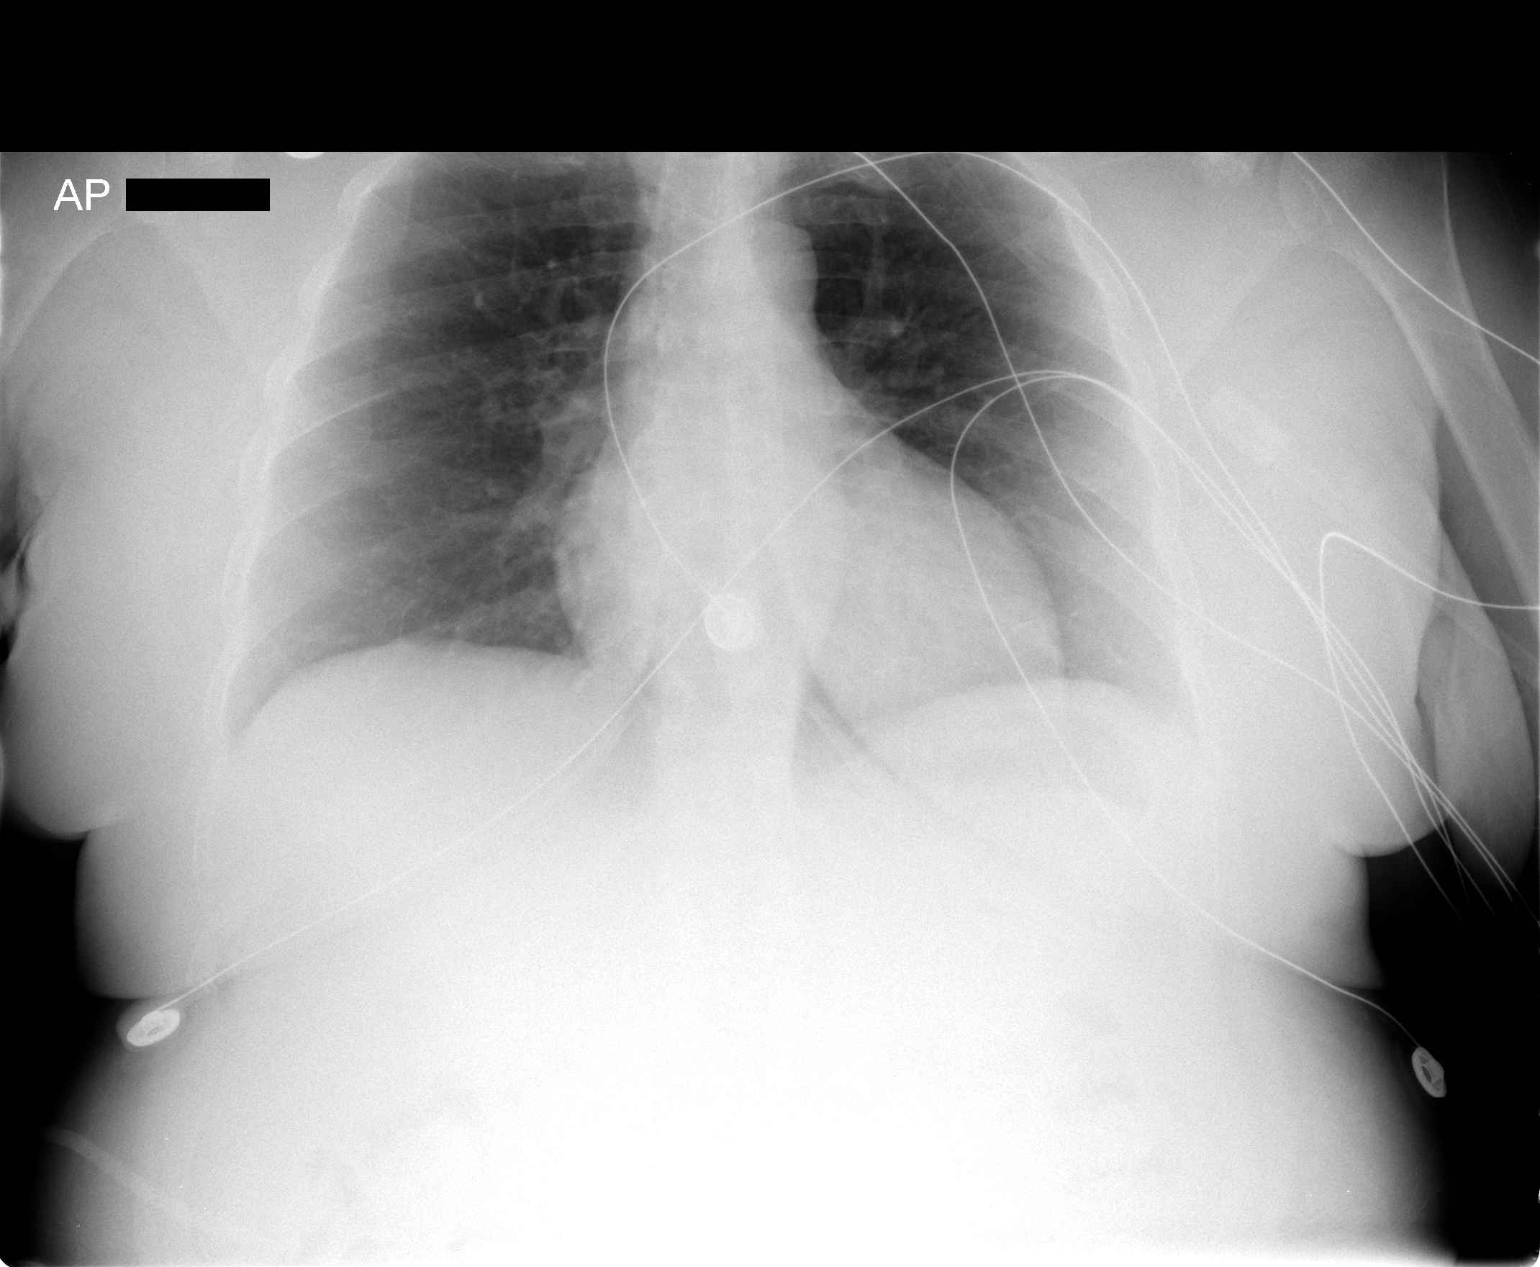

[2 of 2 positions shown; findings below may reference images not displayed]

FINDINGS: The lungs are well-aerated and clear.  There is no
evidence of focal opacification, pleural effusion or pneumothorax.

The heart is normal in size; the mediastinal contour is within
normal limits.  No acute osseous abnormalities are seen.  Mild
degenerative change is noted along the thoracic spine.  Anterior
and posterior spinal fusion hardware is noted along the lower
cervical spine.
IMPRESSION: No acute cardiopulmonary process seen.

## 2011-09-23 DIAGNOSIS — E119 Type 2 diabetes mellitus without complications: Secondary | ICD-10-CM | POA: Diagnosis not present

## 2011-09-23 DIAGNOSIS — I781 Nevus, non-neoplastic: Secondary | ICD-10-CM | POA: Diagnosis not present

## 2011-10-11 DIAGNOSIS — D234 Other benign neoplasm of skin of scalp and neck: Secondary | ICD-10-CM | POA: Diagnosis not present

## 2011-10-11 DIAGNOSIS — B079 Viral wart, unspecified: Secondary | ICD-10-CM | POA: Diagnosis not present

## 2011-11-06 DIAGNOSIS — Z23 Encounter for immunization: Secondary | ICD-10-CM | POA: Diagnosis not present

## 2011-11-08 DIAGNOSIS — E1129 Type 2 diabetes mellitus with other diabetic kidney complication: Secondary | ICD-10-CM | POA: Diagnosis not present

## 2011-11-08 DIAGNOSIS — J309 Allergic rhinitis, unspecified: Secondary | ICD-10-CM | POA: Diagnosis not present

## 2011-11-08 DIAGNOSIS — I129 Hypertensive chronic kidney disease with stage 1 through stage 4 chronic kidney disease, or unspecified chronic kidney disease: Secondary | ICD-10-CM | POA: Diagnosis not present

## 2011-11-08 DIAGNOSIS — I10 Essential (primary) hypertension: Secondary | ICD-10-CM | POA: Diagnosis not present

## 2011-11-08 DIAGNOSIS — E119 Type 2 diabetes mellitus without complications: Secondary | ICD-10-CM | POA: Diagnosis not present

## 2012-01-07 IMAGING — CR DG CHEST 2V
2 series · 2 of 2 positions shown · non-contrast
Comparison: 10/13/2008

CLINICAL DATA: Chest pain.

CHEST - 2 VIEW

[w chest pa]
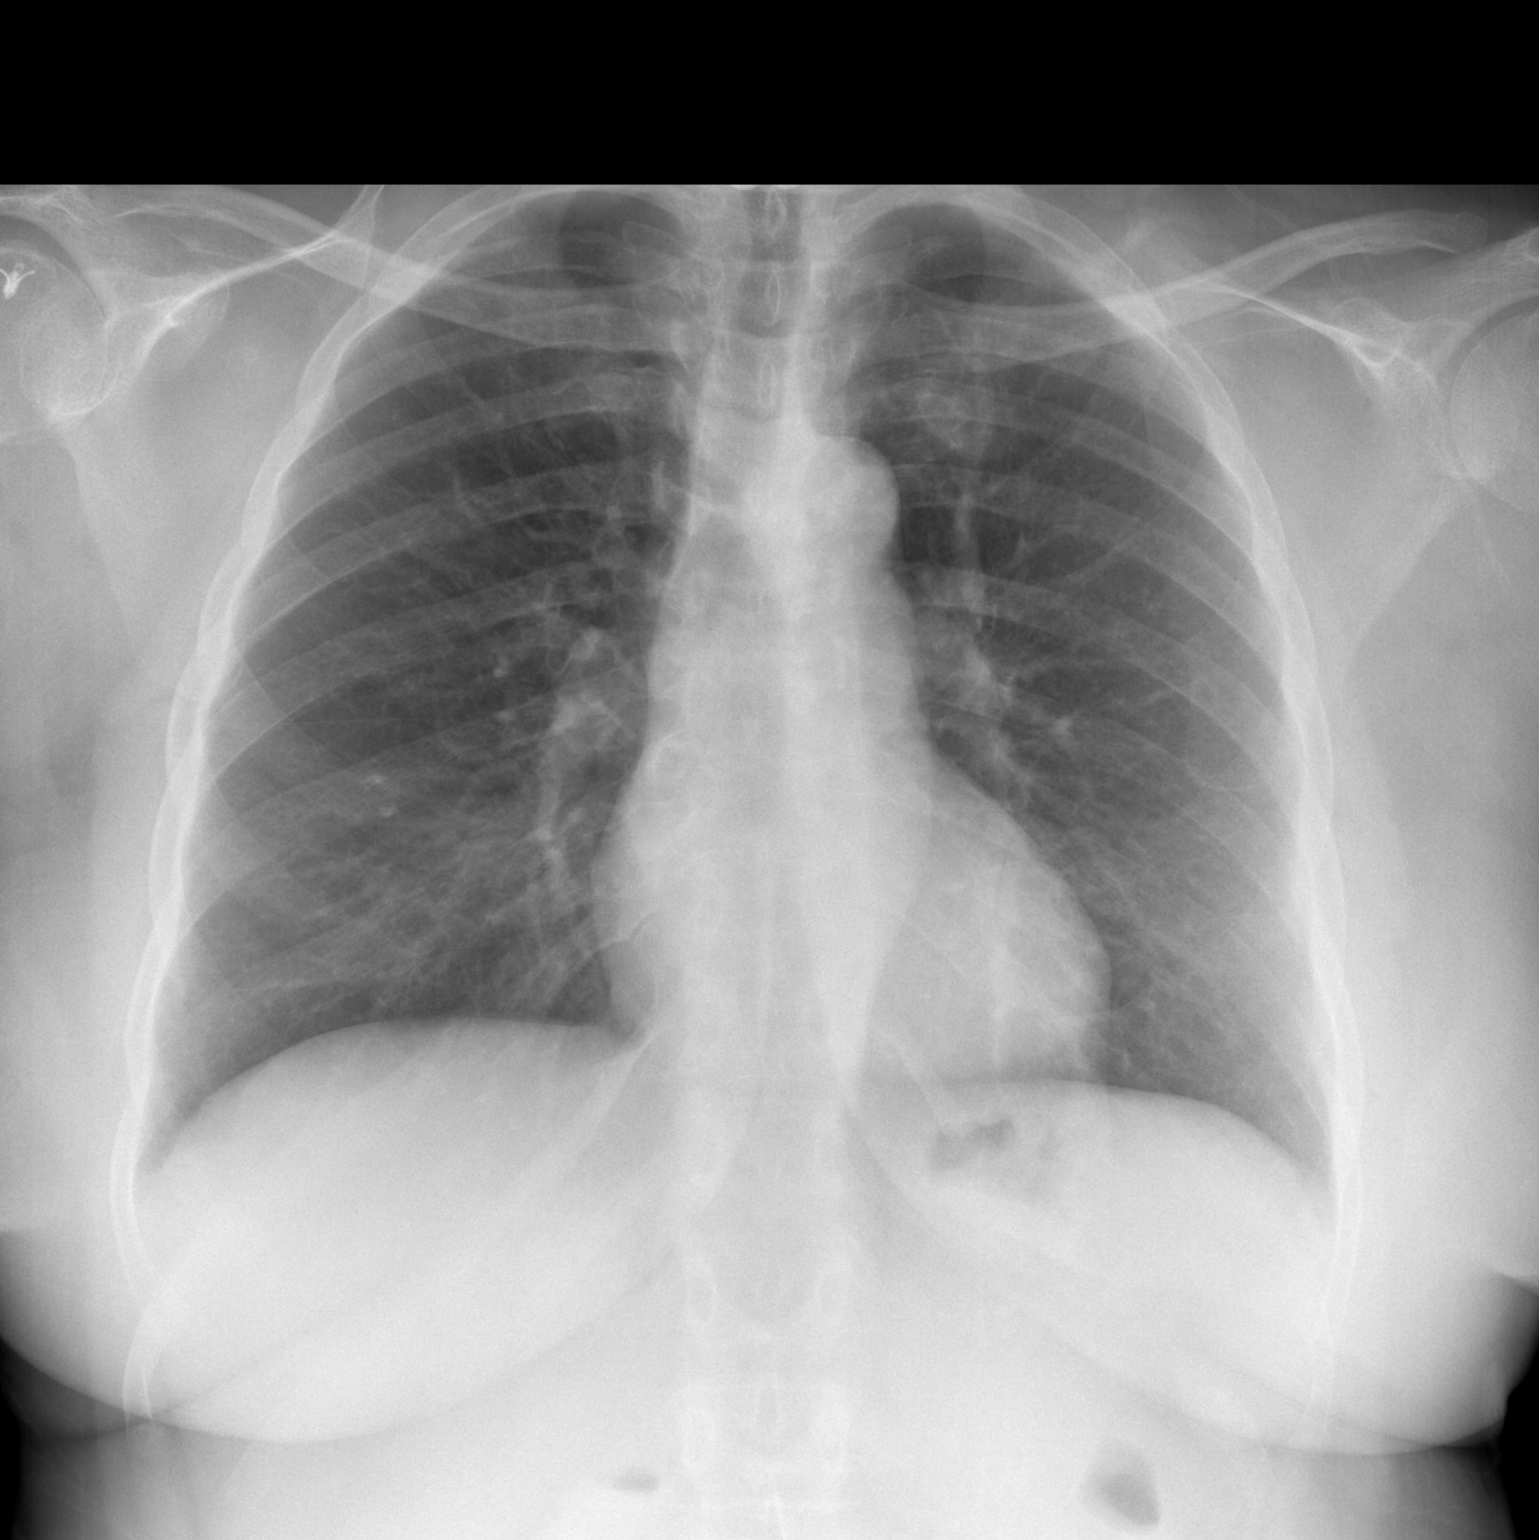

[w chest lat]
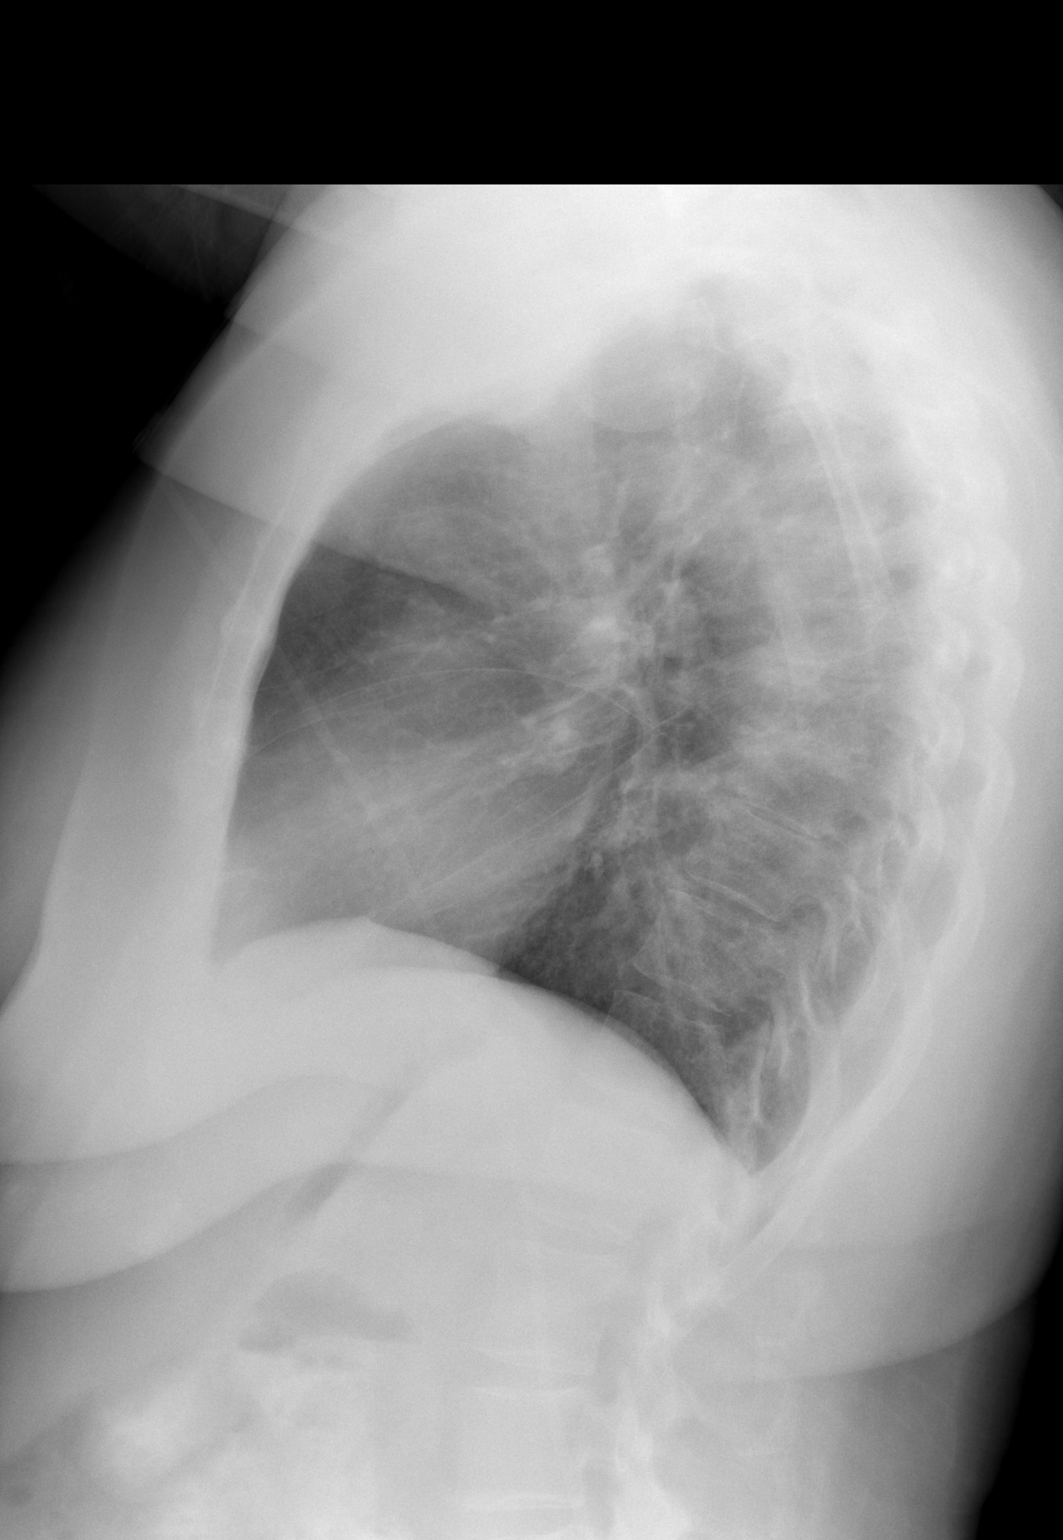

[2 of 2 positions shown; findings below may reference images not displayed]

FINDINGS: The cardiac silhouette, mediastinal and hilar contours
are within normal limits.  The lungs are clear.  The bony thorax is
intact. Stable degenerative changes in the mid thoracic spine.
Cervical fusion hardware is noted.
IMPRESSION: No acute cardiopulmonary findings.

## 2012-01-07 IMAGING — CT CT ABD-PELV W/O CM
2 of 4 series · 14 of 32 positions shown, 19 images · non-contrast
Comparison: CT abdomen pelvis of 10/13/2008

CLINICAL DATA: Mid abdominal pain, nausea, vomiting, diarrhea,
history of nephrectomy

CT ABDOMEN AND PELVIS WITHOUT CONTRAST
TECHNIQUE: Multidetector CT imaging of the abdomen and pelvis was
performed following the standard protocol without intravenous
contrast.

[Series 2: routine abdomen · axial · 0.79mm/px · z∈[-351,-76]mm · 6 of 77 slices shown, 11 images]
[im 11/77  soft-tissue]
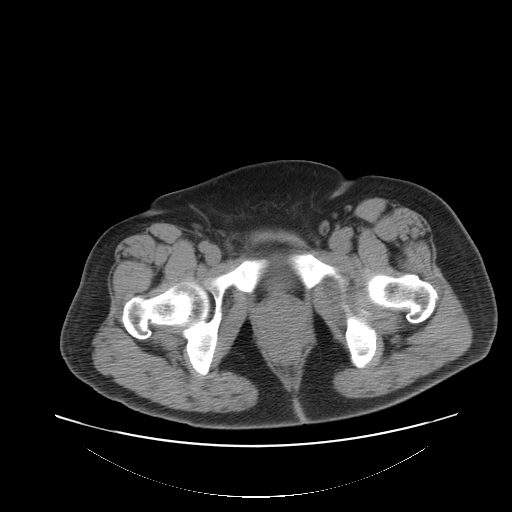
[im 11/77  bone]
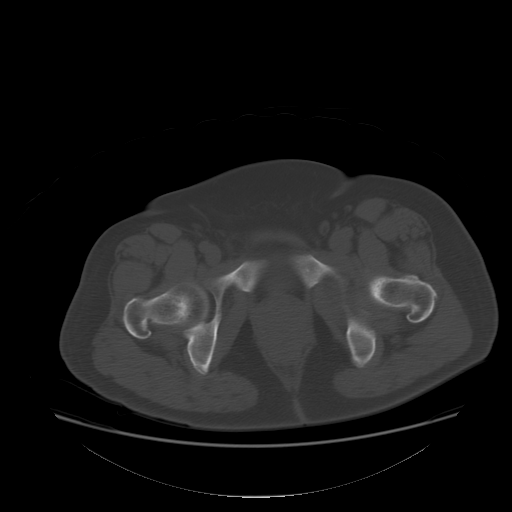
[im 22/77  soft-tissue]
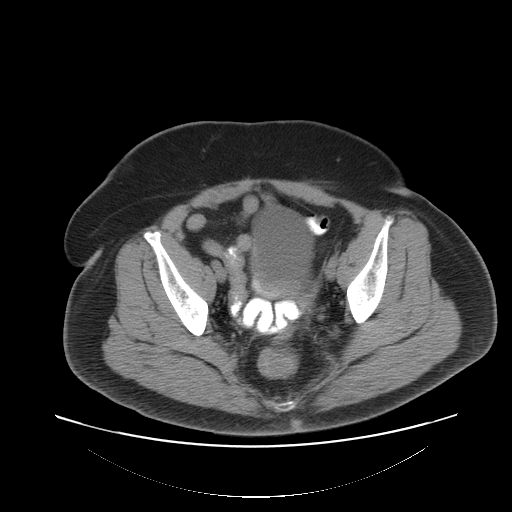
[im 33/77  soft-tissue]
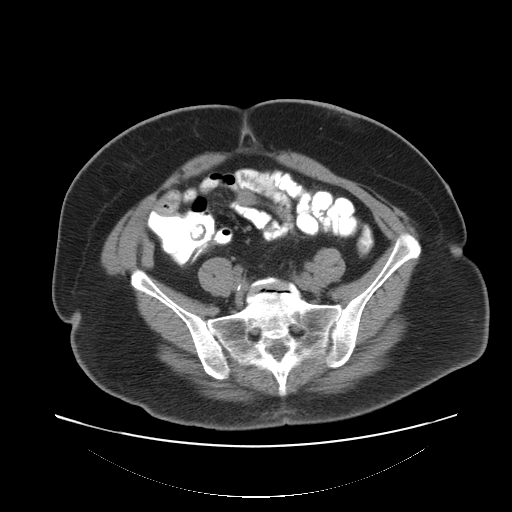
[im 33/77  lung]
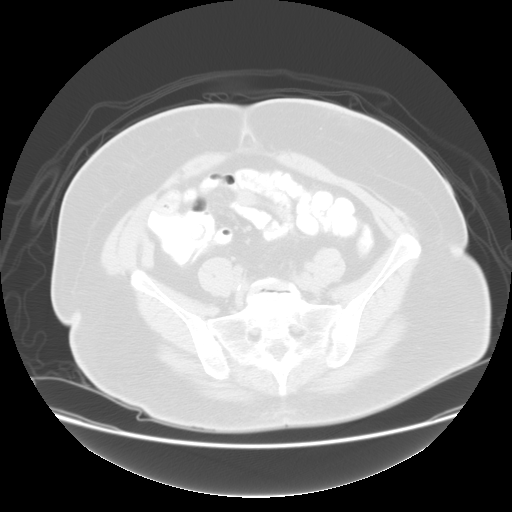
[im 44/77  soft-tissue]
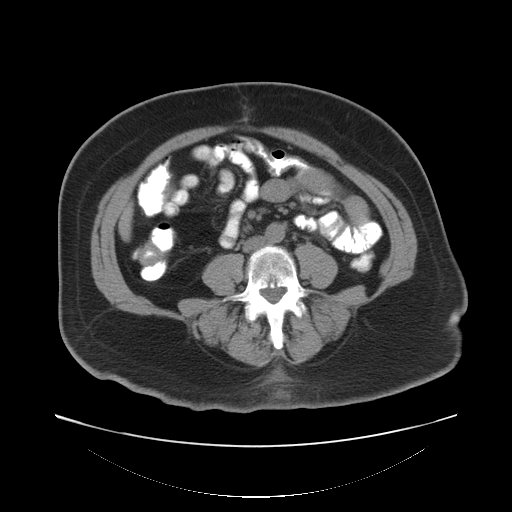
[im 44/77  lung]
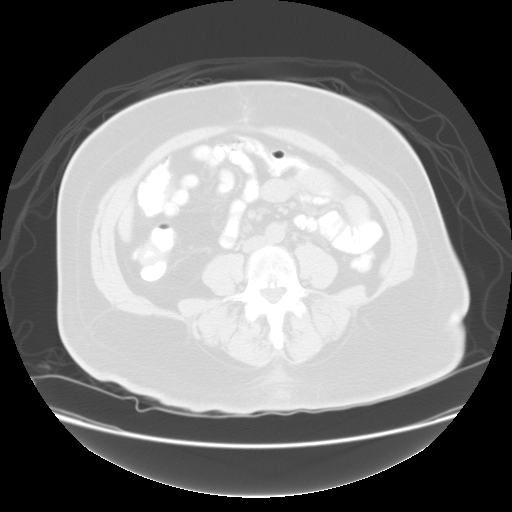
[im 55/77  soft-tissue]
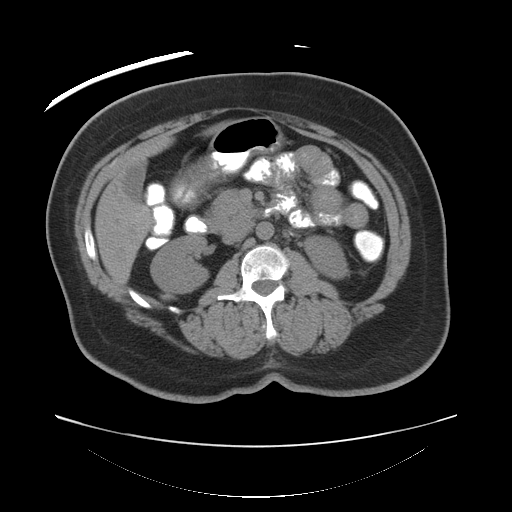
[im 55/77  lung]
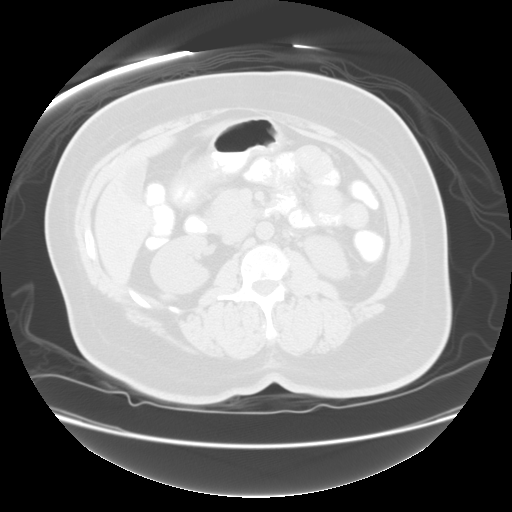
[im 66/77  soft-tissue]
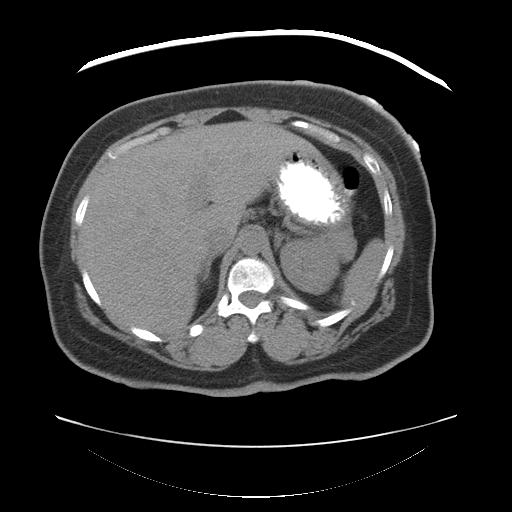
[im 66/77  lung]
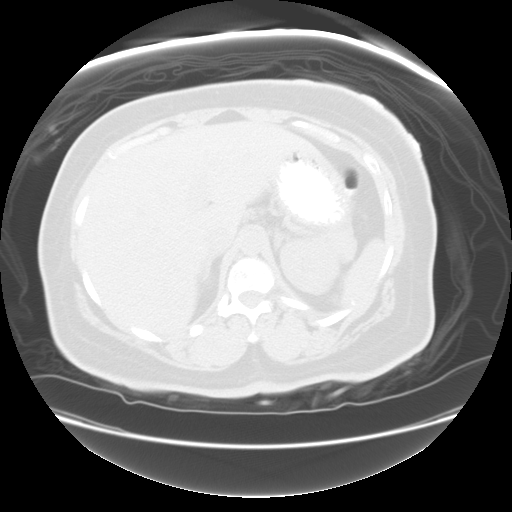

[Series 400: sag · sagittal · 0.79mm/px · 8 of 115 slices shown]
[im 11/115  soft-tissue]
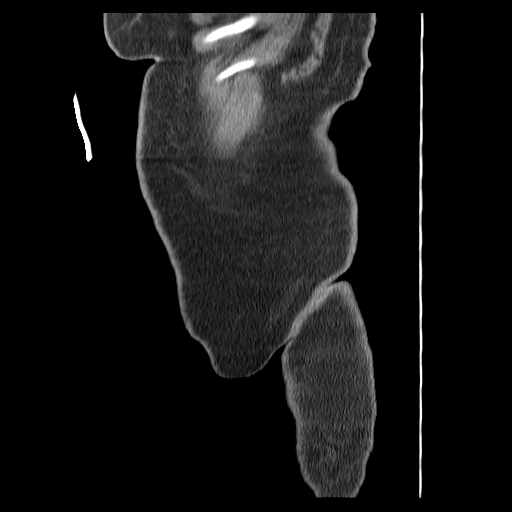
[im 21/115  soft-tissue]
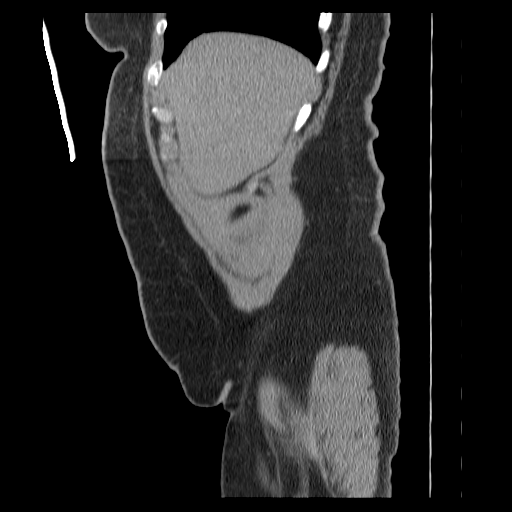
[im 42/115  soft-tissue]
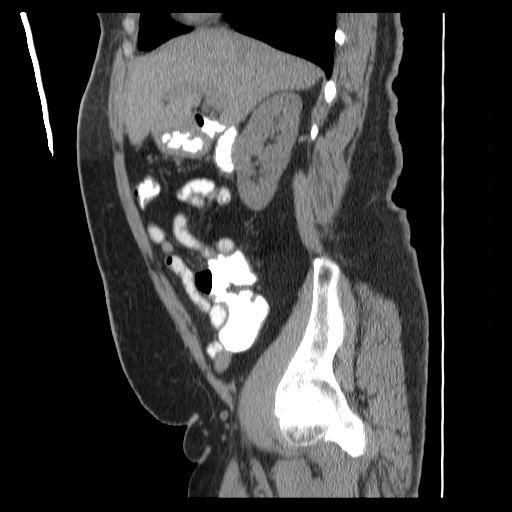
[im 52/115  soft-tissue]
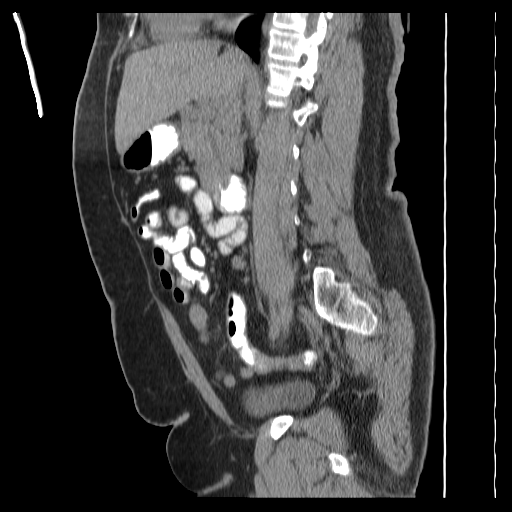
[im 63/115  soft-tissue]
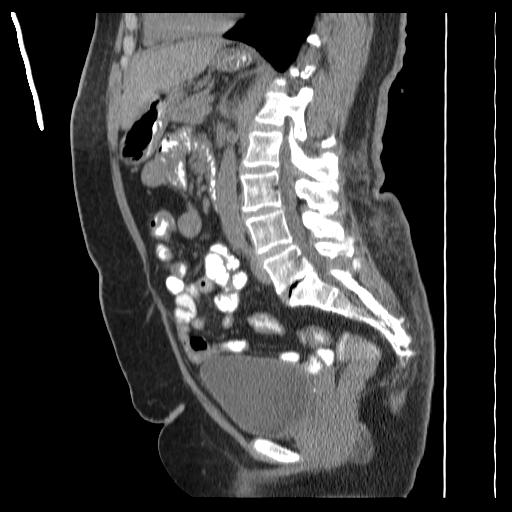
[im 73/115  soft-tissue]
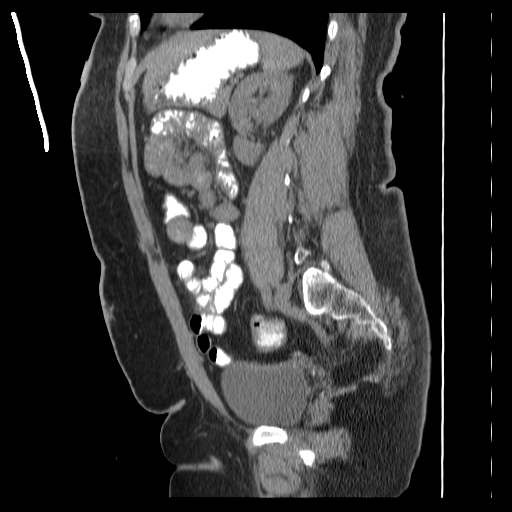
[im 94/115  soft-tissue]
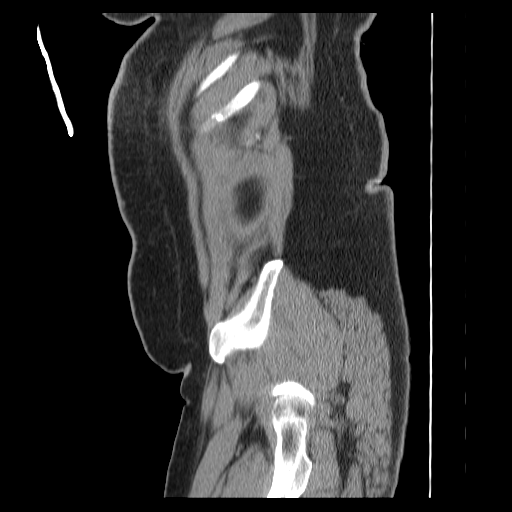
[im 104/115  soft-tissue]
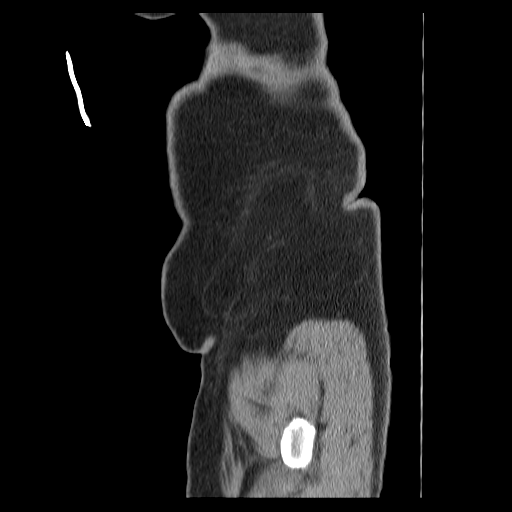

[14 of 32 positions shown; findings below may reference images not displayed]

FINDINGS: The lung bases are clear.  The liver appears normal in
the unenhanced state.  No calcified gallstones are seen.  The
pancreas is normal in size and the pancreatic duct is not dilated.
The adrenal glands and spleen appear stable.  Irregularity of the
posterior inferior left kidney appears be due to prior resection of
left lower pole lesion with scarring.  No renal calculi are seen
and no renal mass is evident.  No hydronephrosis is seen.  The
abdominal aorta is normal in caliber.

The urinary bladder is unremarkable.  The uterus appears to have
been removed.  No adnexal lesion is seen.  The terminal ileum
appears normal.  There is soft tissue strandiness superficially
within the soft tissues of the anterior left lower quadrant of
uncertain significance.  This could be due to prior surgery or
possibly trauma and represent bruising.  Degenerative disc disease
is present at L5-S1.
IMPRESSION: 1.  No acute abnormality on CT of the abdomen and pelvis.  No renal
calculi or hydronephrosis.
2.  Postoperative change involving the lower pole of the left
kidney.
3.  Linear strandiness in the superficial soft tissues of the left
lower quadrant of uncertain significance.  Question prior surgery
or trauma.

## 2012-01-10 DIAGNOSIS — C649 Malignant neoplasm of unspecified kidney, except renal pelvis: Secondary | ICD-10-CM | POA: Diagnosis not present

## 2012-01-13 ENCOUNTER — Ambulatory Visit (HOSPITAL_COMMUNITY)
Admission: RE | Admit: 2012-01-13 | Discharge: 2012-01-13 | Disposition: A | Discharge status: Home / Self Care | Payer: Medicare Other | Source: Ambulatory Visit | Attending: Urology | Admitting: Urology

## 2012-01-13 ENCOUNTER — Other Ambulatory Visit (HOSPITAL_COMMUNITY): Payer: Self-pay | Admitting: Urology

## 2012-01-13 DIAGNOSIS — C649 Malignant neoplasm of unspecified kidney, except renal pelvis: Secondary | ICD-10-CM | POA: Insufficient documentation

## 2012-01-14 DIAGNOSIS — C649 Malignant neoplasm of unspecified kidney, except renal pelvis: Secondary | ICD-10-CM | POA: Diagnosis not present

## 2012-01-15 DIAGNOSIS — C649 Malignant neoplasm of unspecified kidney, except renal pelvis: Secondary | ICD-10-CM | POA: Diagnosis not present

## 2012-01-23 DIAGNOSIS — I10 Essential (primary) hypertension: Secondary | ICD-10-CM | POA: Diagnosis not present

## 2012-01-23 DIAGNOSIS — E7800 Pure hypercholesterolemia, unspecified: Secondary | ICD-10-CM | POA: Diagnosis not present

## 2012-03-13 IMAGING — CR DG CHEST 2V
2 series · 2 of 2 positions shown · non-contrast
Comparison: 02/25/2009.

CLINICAL DATA: Chest pain and cough.

CHEST - 2 VIEW

[w chest pa]
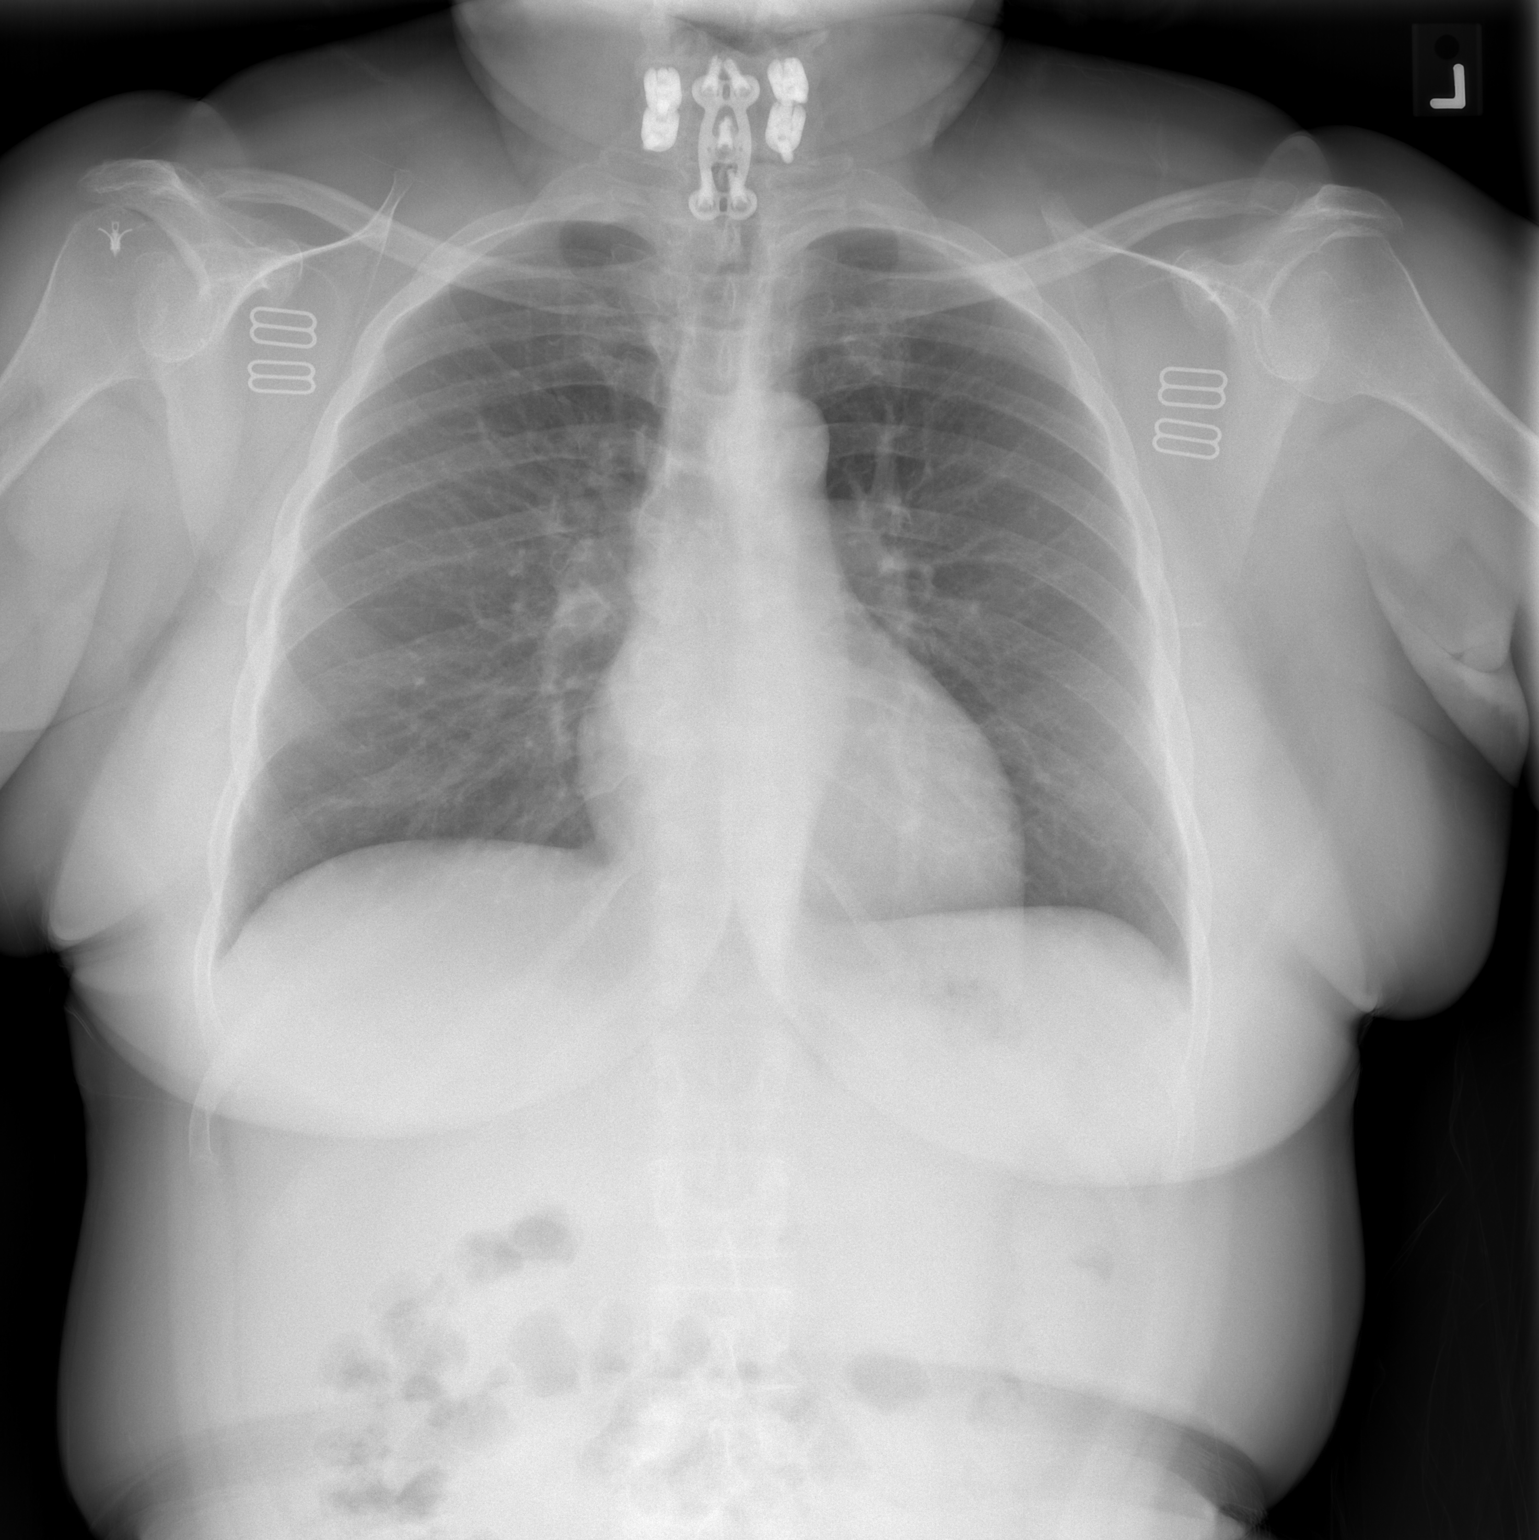

[w chest lat]
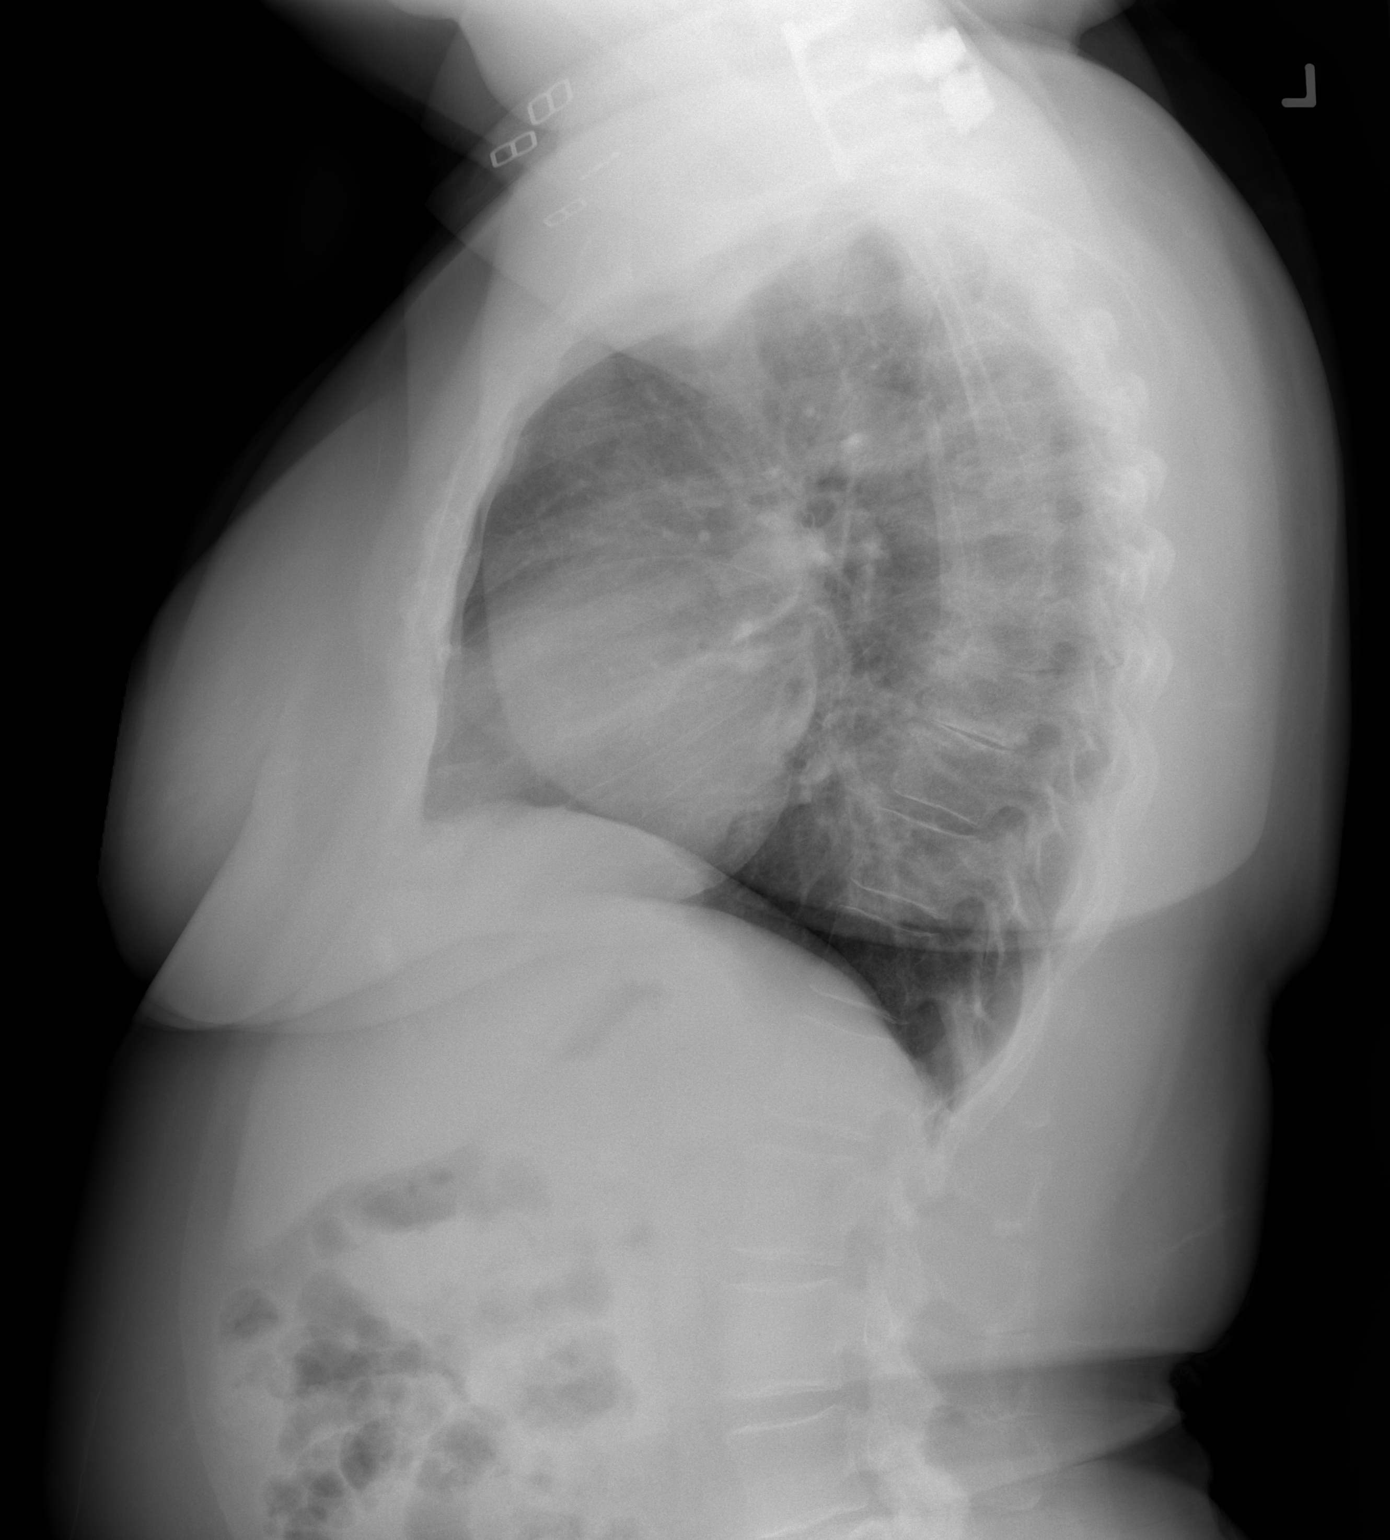

[2 of 2 positions shown; findings below may reference images not displayed]

FINDINGS: Heart size is normal and there is no heart failure.  The
lungs are clear without infiltrate or effusion.  There has  been
prior fusion in the lower cervical spine.
IMPRESSION: No active cardiopulmonary disease.

## 2012-05-16 IMAGING — CR DG CHEST 2V
2 series · 2 of 2 positions shown · non-contrast
Comparison: 05/02/2009

CLINICAL DATA: Productive cough

CHEST - 2 VIEW

[w chest pa]
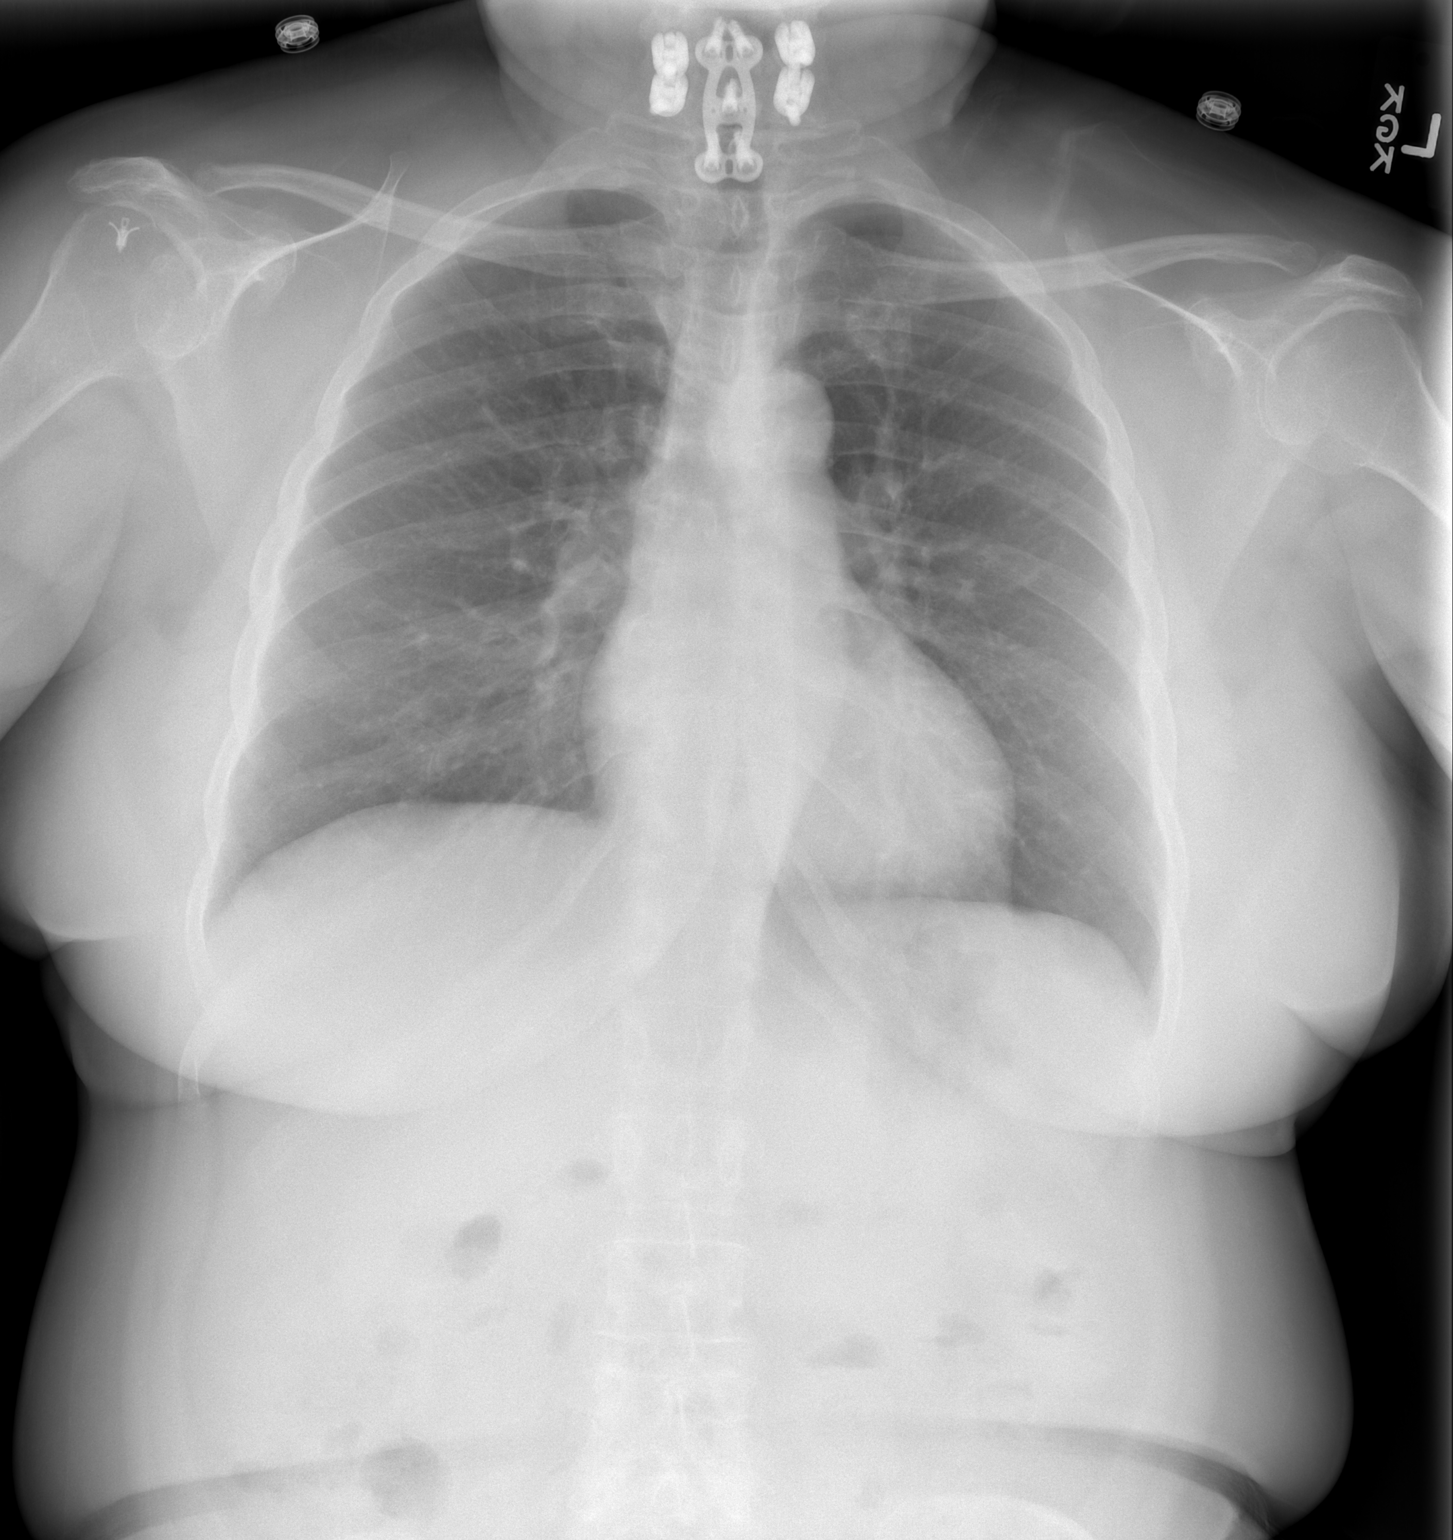

[w chest lat]
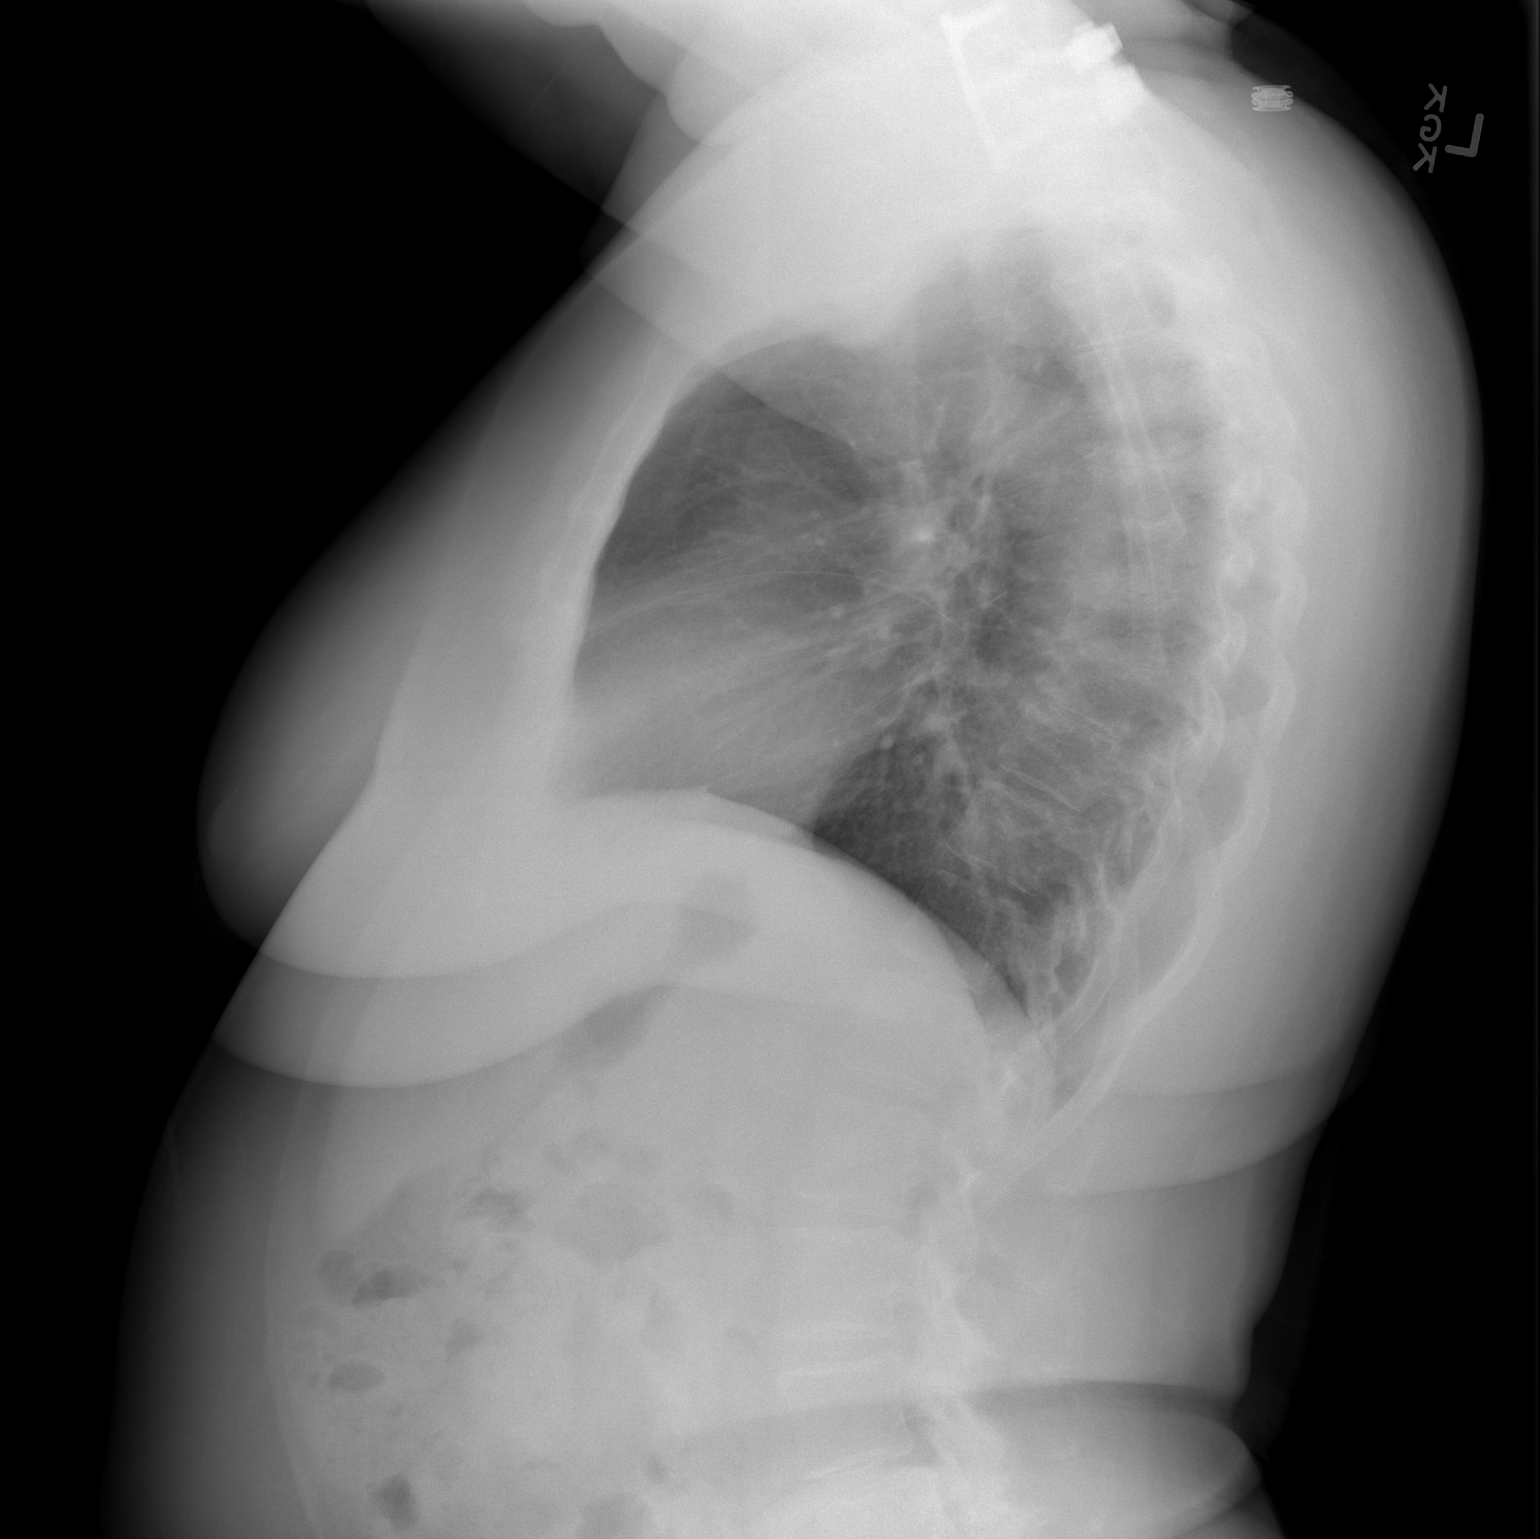

[2 of 2 positions shown; findings below may reference images not displayed]

FINDINGS: Heart and mediastinal contours normal.  Lungs clear.  No
pleural fluid.  Osseous structures and soft tissues unremarkable.

Prior cervical fusion.
IMPRESSION: No active disease.

## 2012-06-06 ENCOUNTER — Encounter (HOSPITAL_COMMUNITY): Payer: Self-pay | Admitting: Emergency Medicine

## 2012-06-06 ENCOUNTER — Emergency Department (HOSPITAL_COMMUNITY): Payer: Medicare Other

## 2012-06-06 ENCOUNTER — Emergency Department (HOSPITAL_COMMUNITY)
Admission: EM | Admit: 2012-06-06 | Discharge: 2012-06-06 | Disposition: A | Discharge status: Home / Self Care | Payer: Medicare Other | Attending: Emergency Medicine | Admitting: Emergency Medicine

## 2012-06-06 DIAGNOSIS — Z79899 Other long term (current) drug therapy: Secondary | ICD-10-CM | POA: Diagnosis not present

## 2012-06-06 DIAGNOSIS — N189 Chronic kidney disease, unspecified: Secondary | ICD-10-CM | POA: Insufficient documentation

## 2012-06-06 DIAGNOSIS — K219 Gastro-esophageal reflux disease without esophagitis: Secondary | ICD-10-CM | POA: Insufficient documentation

## 2012-06-06 DIAGNOSIS — S298XXA Other specified injuries of thorax, initial encounter: Secondary | ICD-10-CM | POA: Diagnosis not present

## 2012-06-06 DIAGNOSIS — Z7982 Long term (current) use of aspirin: Secondary | ICD-10-CM | POA: Insufficient documentation

## 2012-06-06 DIAGNOSIS — E119 Type 2 diabetes mellitus without complications: Secondary | ICD-10-CM | POA: Diagnosis not present

## 2012-06-06 DIAGNOSIS — Z794 Long term (current) use of insulin: Secondary | ICD-10-CM | POA: Insufficient documentation

## 2012-06-06 DIAGNOSIS — E7800 Pure hypercholesterolemia, unspecified: Secondary | ICD-10-CM | POA: Diagnosis not present

## 2012-06-06 DIAGNOSIS — S0990XA Unspecified injury of head, initial encounter: Secondary | ICD-10-CM | POA: Diagnosis not present

## 2012-06-06 DIAGNOSIS — I10 Essential (primary) hypertension: Secondary | ICD-10-CM | POA: Diagnosis not present

## 2012-06-06 DIAGNOSIS — R0602 Shortness of breath: Secondary | ICD-10-CM | POA: Diagnosis not present

## 2012-06-06 DIAGNOSIS — S0993XA Unspecified injury of face, initial encounter: Secondary | ICD-10-CM | POA: Diagnosis not present

## 2012-06-06 DIAGNOSIS — R079 Chest pain, unspecified: Secondary | ICD-10-CM | POA: Insufficient documentation

## 2012-06-06 DIAGNOSIS — R0789 Other chest pain: Secondary | ICD-10-CM | POA: Diagnosis not present

## 2012-06-06 DIAGNOSIS — R51 Headache: Secondary | ICD-10-CM | POA: Diagnosis not present

## 2012-06-06 LAB — CBC WITH DIFFERENTIAL/PLATELET
Basophils Relative: 0 % (ref 0–1)
Hemoglobin: 13.3 g/dL (ref 12.0–15.0)
Lymphs Abs: 1.8 10*3/uL (ref 0.7–4.0)
MCHC: 33.1 g/dL (ref 30.0–36.0)
Monocytes Relative: 10 % (ref 3–12)
Neutro Abs: 2.2 10*3/uL (ref 1.7–7.7)
Neutrophils Relative %: 48 % (ref 43–77)
RBC: 4.38 MIL/uL (ref 3.87–5.11)
WBC: 4.6 10*3/uL (ref 4.0–10.5)

## 2012-06-06 LAB — COMPREHENSIVE METABOLIC PANEL
Albumin: 3.5 g/dL (ref 3.5–5.2)
Alkaline Phosphatase: 72 U/L (ref 39–117)
BUN: 13 mg/dL (ref 6–23)
Chloride: 104 mEq/L (ref 96–112)
Potassium: 3.8 mEq/L (ref 3.5–5.1)
Total Bilirubin: 0.3 mg/dL (ref 0.3–1.2)

## 2012-06-06 LAB — POCT I-STAT TROPONIN I: Troponin i, poc: 0 ng/mL (ref 0.00–0.08)

## 2012-06-06 MED ORDER — SODIUM CHLORIDE 0.9 % IV SOLN
INTRAVENOUS | Status: DC
  Administered 2012-06-06: 12:00:00 via INTRAVENOUS

## 2012-06-06 NOTE — ED Notes (Signed)
Pt states that 2 days ago, she started having sharp chest pains radiating to her left shoulder.  States that the pain just hasn't gone away.  States that sometimes it takes her breath away.

## 2012-06-06 NOTE — ED Provider Notes (Signed)
History     CSN: [id number]  Arrival date & time 06/06/12  1125   First MD Initiated Contact with Patient 06/06/12 1135      Chief Complaint  Patient presents with  . Chest Pain    (Consider location/radiation/quality/duration/timing/severity/associated sxs/prior treatment) Patient is a 72 y.o. female presenting with chest pain. The history is provided by the patient.  Chest Pain  patient here with a two-day history of intermittent left-sided sharp chest pain which lasts for seconds. History of same recently he was admitted and had negative rule out. Patient had a cardiac catheterization 6 years ago which according to your records which I reviewed showed no occlusive disease. After reviewing the patient's last hospitalization, patient's symptoms were felt to be due to stress which she does admit that she has had increased stress recently after the death of her friend. Denies any leg pain or swelling. Pain in her chest is vague and not associated with diaphoresis or dyspnea. Does have some palpitations which last for minutes. No treatment used prior to arrival she feels back to her baseline  Past Medical History  Diagnosis Date  . Diabetes mellitus   . Hypertension   . Renal failure (ARF), acute on chronic   . Hypercholesteremia   . Reflux     Past Surgical History  Procedure Laterality Date  . Renal mass excision    . Abdominal hysterectomy    . Appendectomy      Family History  Problem Relation Age of Onset  . Coronary artery disease    . Hypertension Mother     History  Substance Use Topics  . Smoking status: Never Smoker   . Smokeless tobacco: Never Used  . Alcohol Use: No    OB History   Grav Para Term Preterm Abortions TAB SAB Ect Mult Living                  Review of Systems  Cardiovascular: Positive for chest pain.  All other systems reviewed and are negative.    Allergies  Sulfa antibiotics  Home Medications   Current Outpatient Rx  Name   Route  Sig  Dispense  Refill  . acyclovir (ZOVIRAX) 400 MG tablet   Oral   Take 400 mg by mouth 2 (two) times daily.           Marland Kitchen albuterol (PROVENTIL,VENTOLIN) 90 MCG/ACT inhaler   Inhalation   Inhale 1-2 puffs into the lungs every 4 (four) hours as needed. For shortness of breath and wheezing.         Marland Kitchen amLODipine (NORVASC) 2.5 MG tablet   Oral   Take 2.5 mg by mouth daily.           Marland Kitchen aspirin 81 MG EC tablet   Oral   Take 81 mg by mouth daily.           Marland Kitchen atorvastatin (LIPITOR) 10 MG tablet   Oral   Take 10 mg by mouth daily.           . Cholecalciferol (VITAMIN D) 1000 UNITS capsule   Oral   Take 1,000 Units by mouth daily.           Marland Kitchen esomeprazole (NEXIUM) 40 MG capsule   Oral   Take 40 mg by mouth daily before breakfast.           . insulin NPH-insulin regular (NOVOLIN 70/30) (70-30) 100 UNIT/ML injection   Subcutaneous   Inject 50-65 Units into the  skin 2 (two) times daily with a meal. 65 in am, 50 in pm          . lisinopril (PRINIVIL,ZESTRIL) 40 MG tablet   Oral   Take 40 mg by mouth daily.           Marland Kitchen EXPIRED: metoprolol tartrate (LOPRESSOR) 25 MG tablet   Oral   Take 1 tablet (25 mg total) by mouth 2 (two) times daily.   60 tablet   0   . Multiple Vitamin (MULTIVITAMIN) tablet   Oral   Take 1 tablet by mouth daily.           Marland Kitchen EXPIRED: nitroGLYCERIN (NITROSTAT) 0.4 MG SL tablet   Sublingual   Place 1 tablet (0.4 mg total) under the tongue every 5 (five) minutes as needed for chest pain.   30 tablet   12     BP 140/72  Pulse 66  Temp(Src) 98 F (36.7 C) (Oral)  Resp 16  SpO2 100%  Physical Exam  Nursing note and vitals reviewed. Constitutional: She is oriented to person, place, and time. She appears well-developed and well-nourished.  Non-toxic appearance. No distress.  HENT:  Head: Normocephalic and atraumatic.  Eyes: Conjunctivae, EOM and lids are normal. Pupils are equal, round, and reactive to light.  Neck: Normal  range of motion. Neck supple. No tracheal deviation present. No mass present.  Cardiovascular: Normal rate, regular rhythm and normal heart sounds.  Exam reveals no gallop.   No murmur heard. Pulmonary/Chest: Effort normal and breath sounds normal. No stridor. No respiratory distress. She has no decreased breath sounds. She has no wheezes. She has no rhonchi. She has no rales.  Abdominal: Soft. Normal appearance and bowel sounds are normal. She exhibits no distension. There is no tenderness. There is no rebound and no CVA tenderness.  Musculoskeletal: Normal range of motion. She exhibits no edema and no tenderness.  Neurological: She is alert and oriented to person, place, and time. She has normal strength. No cranial nerve deficit or sensory deficit. GCS eye subscore is 4. GCS verbal subscore is 5. GCS motor subscore is 6.  Skin: Skin is warm and dry. No abrasion and no rash noted.  Psychiatric: She has a normal mood and affect. Her speech is normal and behavior is normal.    ED Course  Procedures (including critical care time)  Labs Reviewed  CBC WITH DIFFERENTIAL  COMPREHENSIVE METABOLIC PANEL  D-DIMER, QUANTITATIVE  POCT I-STAT TROPONIN I   No results found.   No diagnosis found.    MDM     Rate: 69   Rhythm: normal sinus rhythm  QRS Axis: normal  Intervals: normal  ST/T Wave abnormalities: normal  Conduction Disutrbances:none  Narrative Interpretation:   Old EKG Reviewed: none available  2:16 PM Patient's old records reviewed. Negative heart catheterization 6 years ago. Her chest pain symptoms are atypical at this time lasting seconds and not exertional. Atypical for angina. D-dimer negative. Troponin negative as well 2. Patient admits to increased stress anxiety will be discharged home       Leota Jacobsen, MD 06/06/12 [phone]

## 2012-06-06 NOTE — ED Notes (Signed)
Patient had a sharp shooting pain in left chest area rated 7/10

## 2012-06-06 NOTE — ED Notes (Signed)
Patient has had a close friend die and has expressed her sadness.Friend died yesterday

## 2012-06-07 ENCOUNTER — Emergency Department (HOSPITAL_COMMUNITY): Payer: Medicare Other

## 2012-06-07 ENCOUNTER — Encounter (HOSPITAL_COMMUNITY): Payer: Self-pay | Admitting: *Deleted

## 2012-06-07 ENCOUNTER — Emergency Department (HOSPITAL_COMMUNITY)
Admission: EM | Admit: 2012-06-07 | Discharge: 2012-06-07 | Disposition: A | Discharge status: Home / Self Care | Payer: Medicare Other | Attending: Emergency Medicine | Admitting: Emergency Medicine

## 2012-06-07 DIAGNOSIS — S298XXA Other specified injuries of thorax, initial encounter: Secondary | ICD-10-CM | POA: Diagnosis not present

## 2012-06-07 DIAGNOSIS — Z794 Long term (current) use of insulin: Secondary | ICD-10-CM | POA: Insufficient documentation

## 2012-06-07 DIAGNOSIS — N179 Acute kidney failure, unspecified: Secondary | ICD-10-CM | POA: Insufficient documentation

## 2012-06-07 DIAGNOSIS — S0990XA Unspecified injury of head, initial encounter: Secondary | ICD-10-CM | POA: Diagnosis not present

## 2012-06-07 DIAGNOSIS — Y9389 Activity, other specified: Secondary | ICD-10-CM | POA: Insufficient documentation

## 2012-06-07 DIAGNOSIS — K219 Gastro-esophageal reflux disease without esophagitis: Secondary | ICD-10-CM | POA: Insufficient documentation

## 2012-06-07 DIAGNOSIS — Z7982 Long term (current) use of aspirin: Secondary | ICD-10-CM | POA: Insufficient documentation

## 2012-06-07 DIAGNOSIS — S139XXA Sprain of joints and ligaments of unspecified parts of neck, initial encounter: Secondary | ICD-10-CM | POA: Insufficient documentation

## 2012-06-07 DIAGNOSIS — S20219A Contusion of unspecified front wall of thorax, initial encounter: Secondary | ICD-10-CM | POA: Insufficient documentation

## 2012-06-07 DIAGNOSIS — S161XXA Strain of muscle, fascia and tendon at neck level, initial encounter: Secondary | ICD-10-CM

## 2012-06-07 DIAGNOSIS — R51 Headache: Secondary | ICD-10-CM | POA: Diagnosis not present

## 2012-06-07 DIAGNOSIS — E7800 Pure hypercholesterolemia, unspecified: Secondary | ICD-10-CM | POA: Insufficient documentation

## 2012-06-07 DIAGNOSIS — R079 Chest pain, unspecified: Secondary | ICD-10-CM | POA: Diagnosis not present

## 2012-06-07 DIAGNOSIS — S239XXA Sprain of unspecified parts of thorax, initial encounter: Secondary | ICD-10-CM | POA: Insufficient documentation

## 2012-06-07 DIAGNOSIS — S29019A Strain of muscle and tendon of unspecified wall of thorax, initial encounter: Secondary | ICD-10-CM

## 2012-06-07 DIAGNOSIS — N189 Chronic kidney disease, unspecified: Secondary | ICD-10-CM | POA: Insufficient documentation

## 2012-06-07 DIAGNOSIS — Z79899 Other long term (current) drug therapy: Secondary | ICD-10-CM | POA: Insufficient documentation

## 2012-06-07 DIAGNOSIS — S199XXA Unspecified injury of neck, initial encounter: Secondary | ICD-10-CM | POA: Diagnosis not present

## 2012-06-07 DIAGNOSIS — I129 Hypertensive chronic kidney disease with stage 1 through stage 4 chronic kidney disease, or unspecified chronic kidney disease: Secondary | ICD-10-CM | POA: Insufficient documentation

## 2012-06-07 DIAGNOSIS — Y92410 Unspecified street and highway as the place of occurrence of the external cause: Secondary | ICD-10-CM | POA: Insufficient documentation

## 2012-06-07 DIAGNOSIS — E119 Type 2 diabetes mellitus without complications: Secondary | ICD-10-CM | POA: Insufficient documentation

## 2012-06-07 MED ORDER — OXYCODONE-ACETAMINOPHEN 5-325 MG PO TABS: 1.0000 | ORAL_TABLET | ORAL | Status: DC | PRN

## 2012-06-07 MED ORDER — OXYCODONE-ACETAMINOPHEN 5-325 MG PO TABS
1.0000 | ORAL_TABLET | Freq: Once | ORAL | Status: DC
  Filled 2012-06-07: qty 1

## 2012-06-07 MED ORDER — OXYCODONE-ACETAMINOPHEN 5-325 MG PO TABS
1.0000 | ORAL_TABLET | Freq: Once | ORAL | Status: AC
  Administered 2012-06-07: 1 via ORAL
  Filled 2012-06-07: qty 1
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
## 2012-06-08 ENCOUNTER — Ambulatory Visit (INDEPENDENT_AMBULATORY_CARE_PROVIDER_SITE_OTHER): Payer: Medicare Other | Admitting: Ophthalmology

## 2012-06-08 DIAGNOSIS — H43819 Vitreous degeneration, unspecified eye: Secondary | ICD-10-CM

## 2012-06-08 DIAGNOSIS — H35039 Hypertensive retinopathy, unspecified eye: Secondary | ICD-10-CM | POA: Diagnosis not present

## 2012-06-08 DIAGNOSIS — H2510 Age-related nuclear cataract, unspecified eye: Secondary | ICD-10-CM

## 2012-06-08 DIAGNOSIS — I10 Essential (primary) hypertension: Secondary | ICD-10-CM

## 2012-06-08 DIAGNOSIS — E11319 Type 2 diabetes mellitus with unspecified diabetic retinopathy without macular edema: Secondary | ICD-10-CM

## 2012-06-08 DIAGNOSIS — E1139 Type 2 diabetes mellitus with other diabetic ophthalmic complication: Secondary | ICD-10-CM

## 2012-06-08 IMAGING — CT CT ABD-PELV W/ CM
1 of 3 series · 14 of 32 positions shown, 19 images · IV contrast (agent unspecified)
Comparison: 02/25/2009

CLINICAL DATA: Lower abdominal and pelvic pain.  Urinary tract
infection.  Previous partial nephrectomy for left renal neoplasm.

CT ABDOMEN AND PELVIS WITH CONTRAST
TECHNIQUE: Multidetector CT imaging of the abdomen and pelvis was
performed following the standard protocol during bolus
administration of intravenous contrast.
Contrast: 100 ml Pmnipaque-IHH and oral contrast

[Series 2: rtn ap with st · axial · 0.72mm/px · z∈[-366,-42]mm · 14 of 75 slices shown, 19 images]
[im 5/75  soft-tissue]
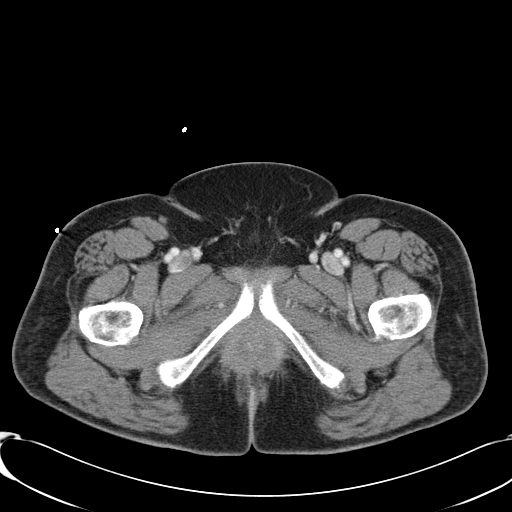
[im 5/75  bone]
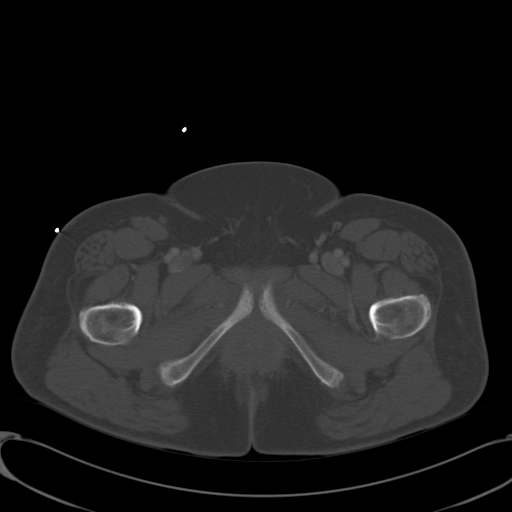
[im 9/75  soft-tissue]
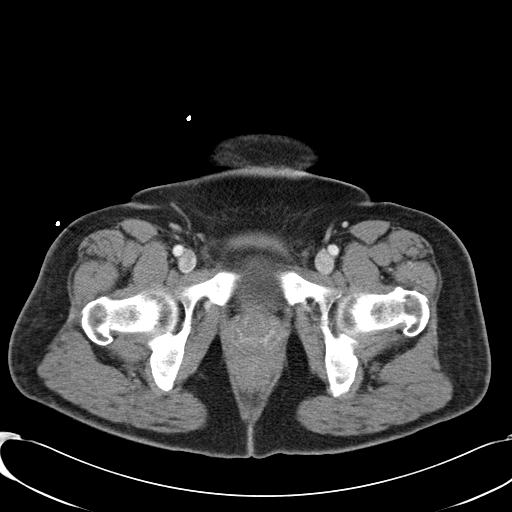
[im 17/75  soft-tissue]
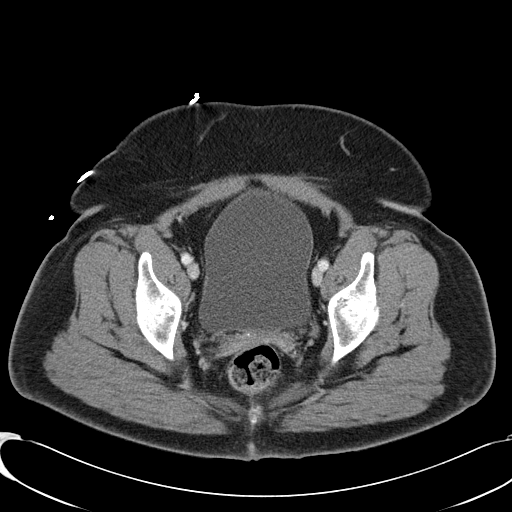
[im 21/75  soft-tissue]
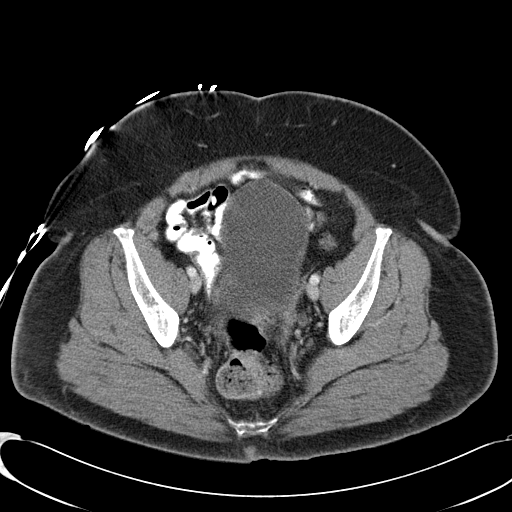
[im 25/75  soft-tissue]
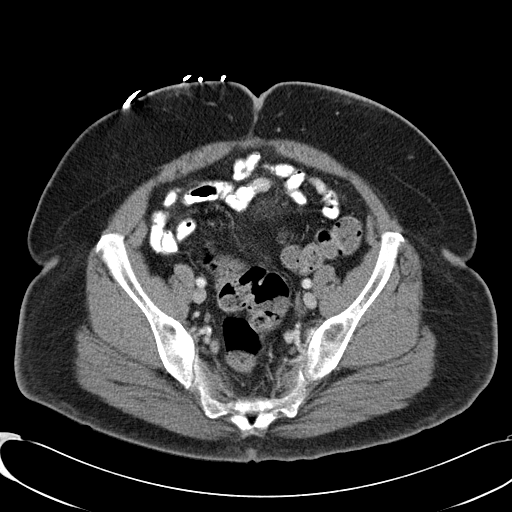
[im 33/75  soft-tissue]
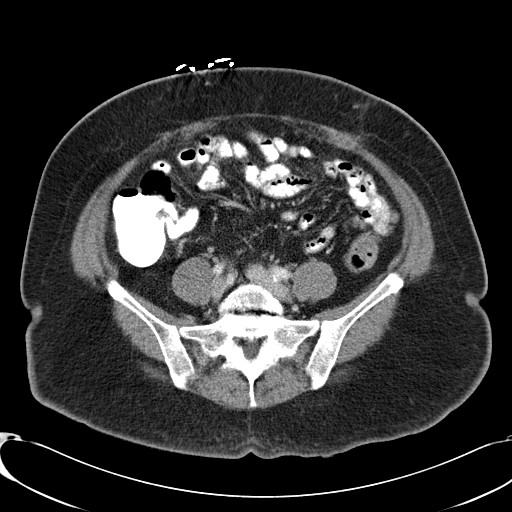
[im 38/75  soft-tissue]
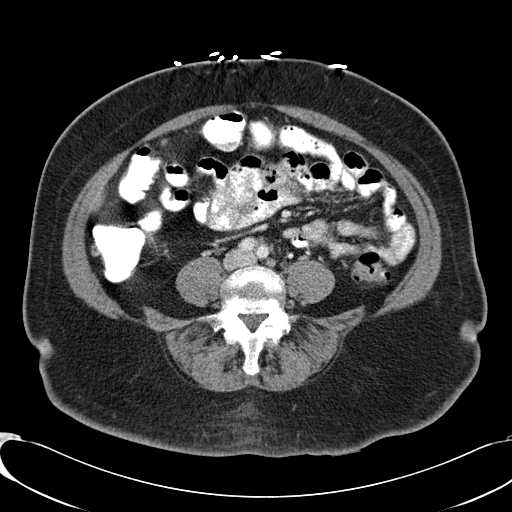
[im 42/75  soft-tissue]
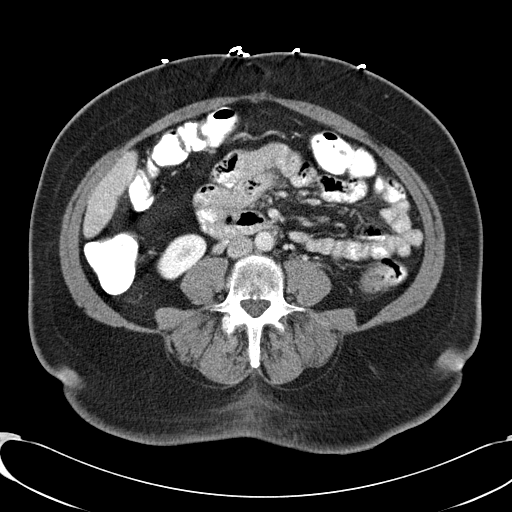
[im 50/75  soft-tissue]
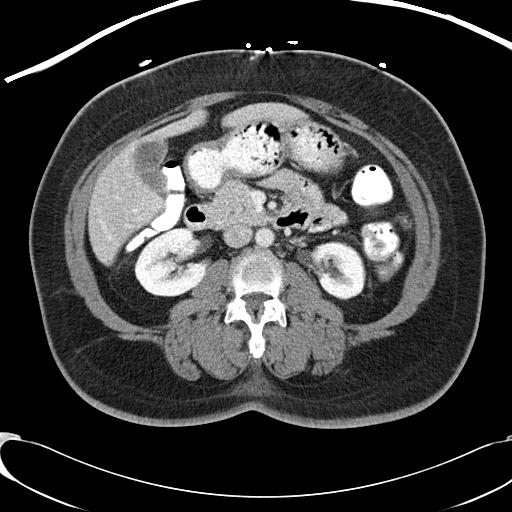
[im 50/75  bone]
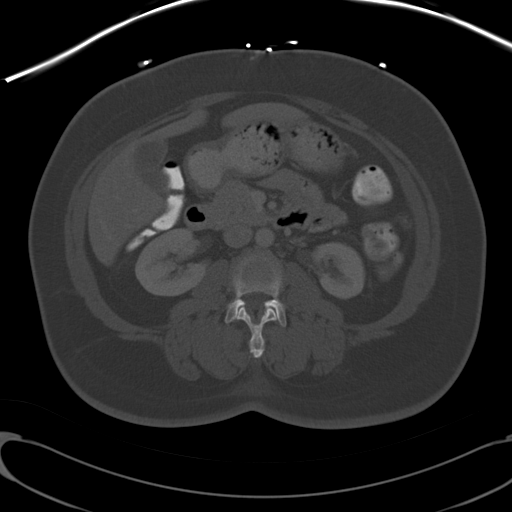
[im 54/75  soft-tissue]
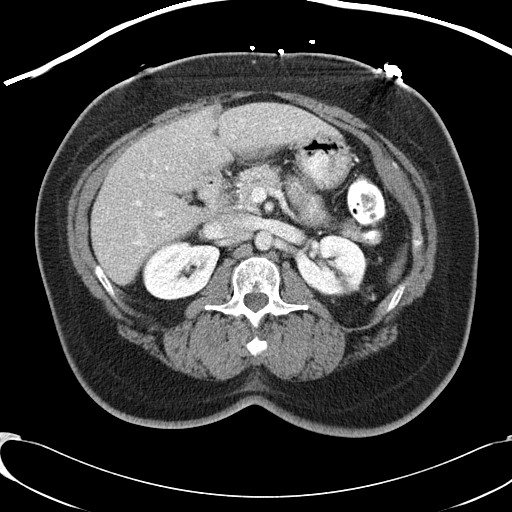
[im 58/75  soft-tissue]
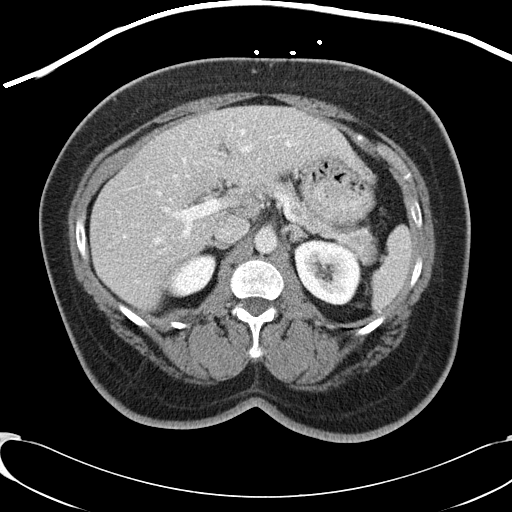
[im 58/75  lung]
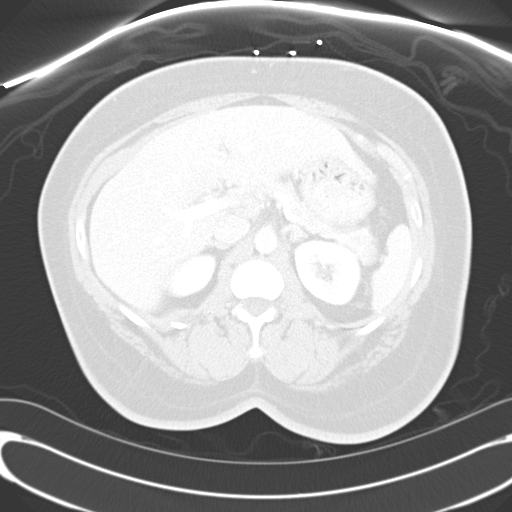
[im 62/75  lung]
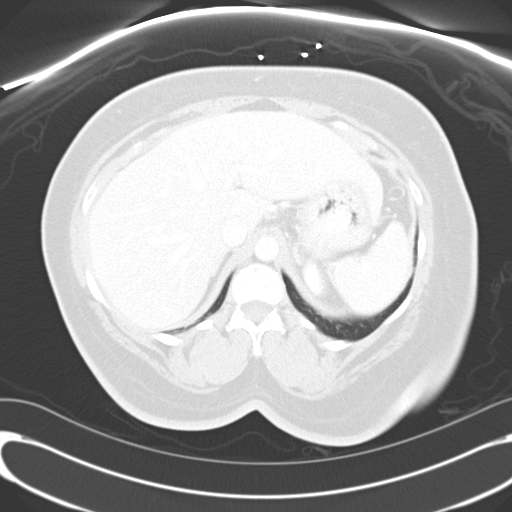
[im 66/75  soft-tissue]
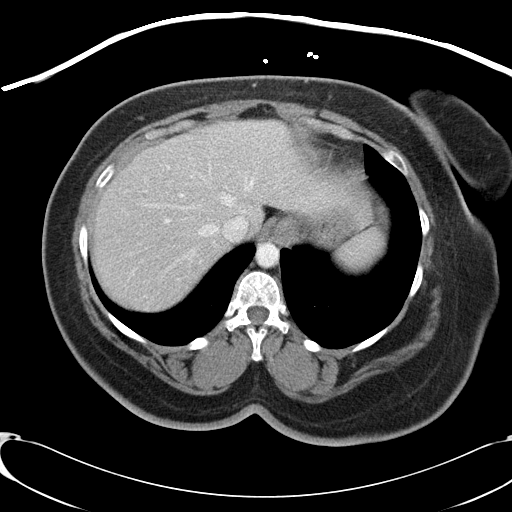
[im 66/75  lung]
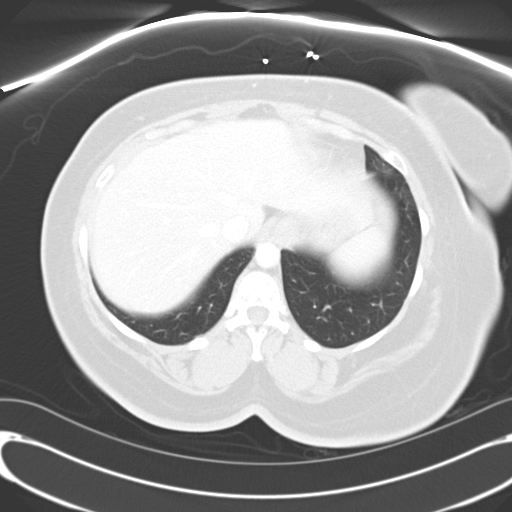
[im 70/75  soft-tissue]
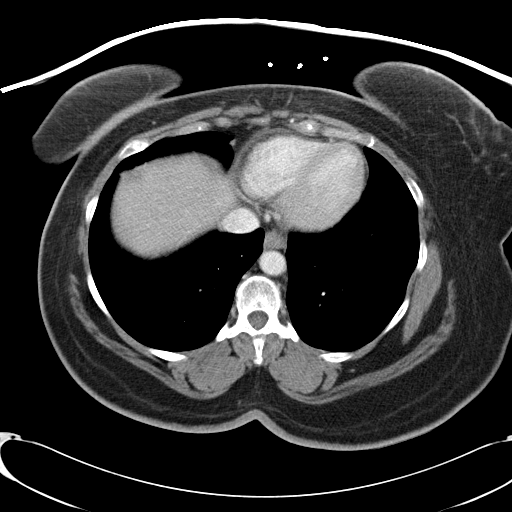
[im 70/75  lung]
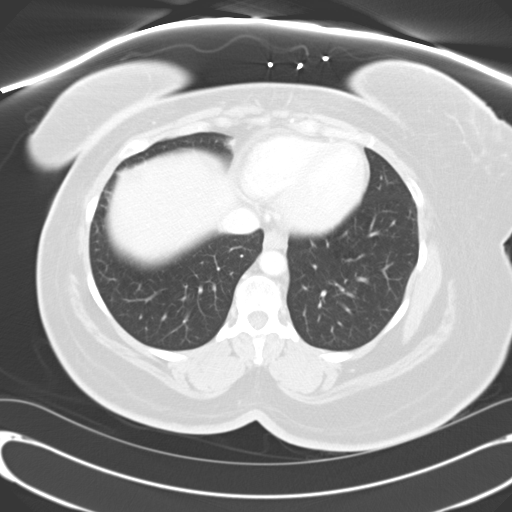

[14 of 32 positions shown; findings below may reference images not displayed]

FINDINGS: Postop changes from partial left nephrectomy again noted.
No soft tissue masses or adenopathy identified within the abdomen
or pelvis.  A probable tiny less than 1 cm cyst is noted in the
anterior right hepatic lobe, and no definite liver masses are
identified.

There is no evidence of retroperitoneal lymphadenopathy.  Previous
hysterectomy noted.  No evidence of inflammatory process or
abnormal fluid collections.  No evidence of dilated bowel loops or
bowel wall thickening.
IMPRESSION: No acute findings in the abdomen or pelvis.  Stable postoperative
changes involving the left kidney.

## 2012-07-02 DIAGNOSIS — Z961 Presence of intraocular lens: Secondary | ICD-10-CM | POA: Diagnosis not present

## 2012-07-02 DIAGNOSIS — H2510 Age-related nuclear cataract, unspecified eye: Secondary | ICD-10-CM | POA: Diagnosis not present

## 2012-07-02 DIAGNOSIS — H35039 Hypertensive retinopathy, unspecified eye: Secondary | ICD-10-CM | POA: Diagnosis not present

## 2012-07-02 DIAGNOSIS — E11319 Type 2 diabetes mellitus with unspecified diabetic retinopathy without macular edema: Secondary | ICD-10-CM | POA: Diagnosis not present

## 2012-07-21 DIAGNOSIS — J209 Acute bronchitis, unspecified: Secondary | ICD-10-CM | POA: Diagnosis not present

## 2012-07-21 DIAGNOSIS — I10 Essential (primary) hypertension: Secondary | ICD-10-CM | POA: Diagnosis not present

## 2012-07-21 DIAGNOSIS — N289 Disorder of kidney and ureter, unspecified: Secondary | ICD-10-CM | POA: Diagnosis not present

## 2012-07-21 DIAGNOSIS — E119 Type 2 diabetes mellitus without complications: Secondary | ICD-10-CM | POA: Diagnosis not present

## 2012-07-22 DIAGNOSIS — E119 Type 2 diabetes mellitus without complications: Secondary | ICD-10-CM | POA: Diagnosis not present

## 2012-07-22 DIAGNOSIS — I10 Essential (primary) hypertension: Secondary | ICD-10-CM | POA: Diagnosis not present

## 2012-07-28 DIAGNOSIS — H269 Unspecified cataract: Secondary | ICD-10-CM | POA: Diagnosis not present

## 2012-07-28 DIAGNOSIS — H2510 Age-related nuclear cataract, unspecified eye: Secondary | ICD-10-CM | POA: Diagnosis not present

## 2012-11-20 ENCOUNTER — Other Ambulatory Visit: Payer: Self-pay | Admitting: Family Medicine

## 2012-11-20 DIAGNOSIS — Z1231 Encounter for screening mammogram for malignant neoplasm of breast: Secondary | ICD-10-CM

## 2012-11-22 DIAGNOSIS — Z23 Encounter for immunization: Secondary | ICD-10-CM | POA: Diagnosis not present

## 2012-12-03 ENCOUNTER — Ambulatory Visit
Admission: RE | Admit: 2012-12-03 | Discharge: 2012-12-03 | Disposition: A | Discharge status: Home / Self Care | Payer: Medicare Other | Source: Ambulatory Visit | Attending: Family Medicine | Admitting: Family Medicine

## 2012-12-03 DIAGNOSIS — Z1231 Encounter for screening mammogram for malignant neoplasm of breast: Secondary | ICD-10-CM

## 2012-12-09 ENCOUNTER — Other Ambulatory Visit: Payer: Self-pay | Admitting: Family Medicine

## 2012-12-09 DIAGNOSIS — N644 Mastodynia: Secondary | ICD-10-CM

## 2013-01-04 ENCOUNTER — Ambulatory Visit
Admission: RE | Admit: 2013-01-04 | Discharge: 2013-01-04 | Disposition: A | Discharge status: Home / Self Care | Payer: Medicare Other | Source: Ambulatory Visit | Attending: Family Medicine | Admitting: Family Medicine

## 2013-01-04 DIAGNOSIS — N644 Mastodynia: Secondary | ICD-10-CM

## 2013-01-04 DIAGNOSIS — N63 Unspecified lump in unspecified breast: Secondary | ICD-10-CM | POA: Diagnosis not present

## 2013-01-20 ENCOUNTER — Other Ambulatory Visit (HOSPITAL_COMMUNITY): Payer: Self-pay | Admitting: Urology

## 2013-01-20 ENCOUNTER — Ambulatory Visit (HOSPITAL_COMMUNITY)
Admission: RE | Admit: 2013-01-20 | Discharge: 2013-01-20 | Disposition: A | Discharge status: Home / Self Care | Payer: Medicare Other | Source: Ambulatory Visit | Attending: Urology | Admitting: Urology

## 2013-01-20 DIAGNOSIS — I10 Essential (primary) hypertension: Secondary | ICD-10-CM | POA: Insufficient documentation

## 2013-01-20 DIAGNOSIS — R0609 Other forms of dyspnea: Secondary | ICD-10-CM | POA: Diagnosis not present

## 2013-01-20 DIAGNOSIS — C649 Malignant neoplasm of unspecified kidney, except renal pelvis: Secondary | ICD-10-CM

## 2013-01-20 DIAGNOSIS — E119 Type 2 diabetes mellitus without complications: Secondary | ICD-10-CM | POA: Insufficient documentation

## 2013-01-20 DIAGNOSIS — N3946 Mixed incontinence: Secondary | ICD-10-CM | POA: Diagnosis not present

## 2013-02-19 DIAGNOSIS — H40019 Open angle with borderline findings, low risk, unspecified eye: Secondary | ICD-10-CM | POA: Diagnosis not present

## 2013-03-01 DIAGNOSIS — N289 Disorder of kidney and ureter, unspecified: Secondary | ICD-10-CM | POA: Diagnosis not present

## 2013-03-01 DIAGNOSIS — E119 Type 2 diabetes mellitus without complications: Secondary | ICD-10-CM | POA: Diagnosis not present

## 2013-03-01 DIAGNOSIS — I10 Essential (primary) hypertension: Secondary | ICD-10-CM | POA: Diagnosis not present

## 2013-03-06 IMAGING — CR DG CHEST 2V
2 series · 2 of 2 positions shown · non-contrast
Comparison: Plain films of the chest 07/05/2009 and 10/13/2008. CT
chest 10/13/2008.

CLINICAL DATA: Renal cell carcinoma.

CHEST - 2 VIEW

[view not recorded (1 of 2)]
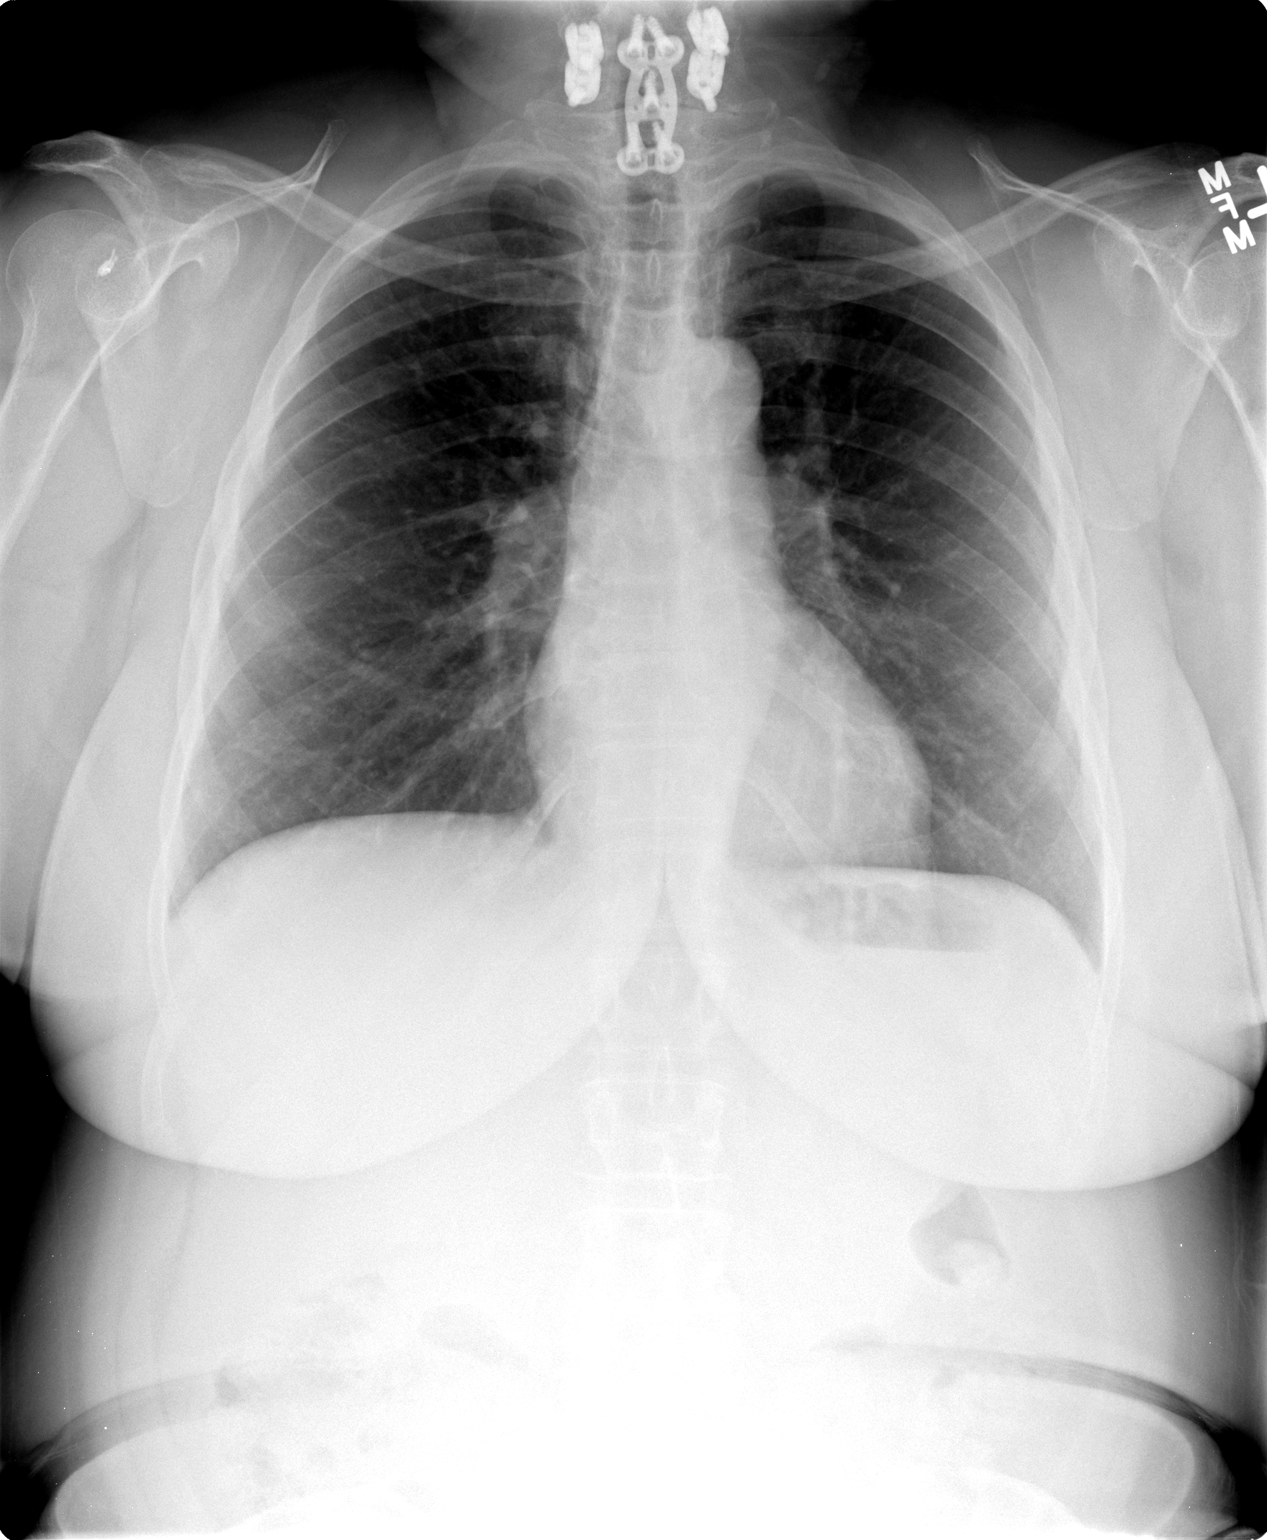

[view not recorded (2 of 2)]
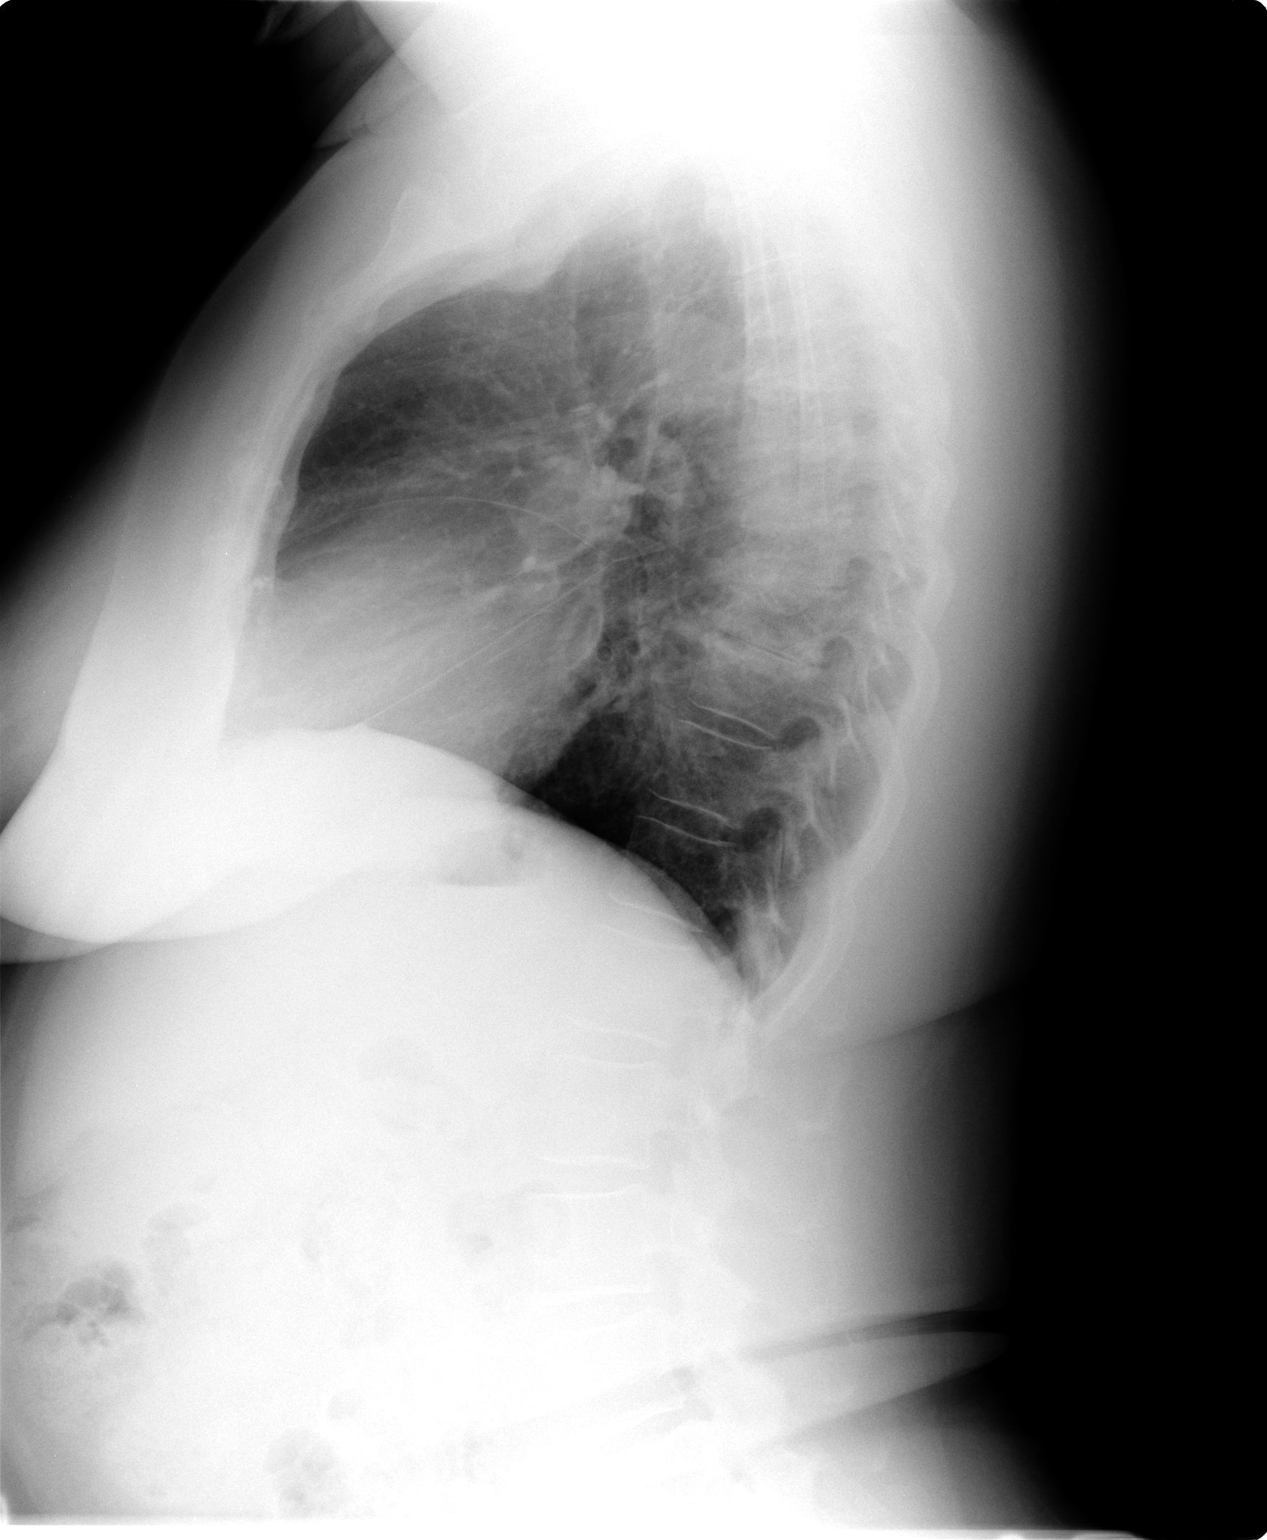

[2 of 2 positions shown; findings below may reference images not displayed]

FINDINGS: Lungs are clear.  No pneumothorax or pleural effusion.
Heart size is normal.  Postoperative change right shoulder and
lower cervical spine noted. Thoracic spondylosis also noted.
IMPRESSION: Negative for metastatic or acute disease.

## 2013-04-03 ENCOUNTER — Emergency Department (HOSPITAL_BASED_OUTPATIENT_CLINIC_OR_DEPARTMENT_OTHER): Payer: Medicare Other

## 2013-04-03 ENCOUNTER — Emergency Department (HOSPITAL_BASED_OUTPATIENT_CLINIC_OR_DEPARTMENT_OTHER)
Admission: EM | Admit: 2013-04-03 | Discharge: 2013-04-03 | Disposition: A | Discharge status: Home / Self Care | Payer: Medicare Other | Attending: Emergency Medicine | Admitting: Emergency Medicine

## 2013-04-03 ENCOUNTER — Encounter (HOSPITAL_BASED_OUTPATIENT_CLINIC_OR_DEPARTMENT_OTHER): Payer: Self-pay | Admitting: Emergency Medicine

## 2013-04-03 DIAGNOSIS — J209 Acute bronchitis, unspecified: Secondary | ICD-10-CM | POA: Diagnosis not present

## 2013-04-03 DIAGNOSIS — R0989 Other specified symptoms and signs involving the circulatory and respiratory systems: Secondary | ICD-10-CM | POA: Diagnosis not present

## 2013-04-03 DIAGNOSIS — R69 Illness, unspecified: Secondary | ICD-10-CM

## 2013-04-03 DIAGNOSIS — N189 Chronic kidney disease, unspecified: Secondary | ICD-10-CM | POA: Insufficient documentation

## 2013-04-03 DIAGNOSIS — R0602 Shortness of breath: Secondary | ICD-10-CM | POA: Diagnosis not present

## 2013-04-03 DIAGNOSIS — J40 Bronchitis, not specified as acute or chronic: Secondary | ICD-10-CM

## 2013-04-03 DIAGNOSIS — E7800 Pure hypercholesterolemia, unspecified: Secondary | ICD-10-CM | POA: Insufficient documentation

## 2013-04-03 DIAGNOSIS — E119 Type 2 diabetes mellitus without complications: Secondary | ICD-10-CM | POA: Insufficient documentation

## 2013-04-03 DIAGNOSIS — Z794 Long term (current) use of insulin: Secondary | ICD-10-CM | POA: Diagnosis not present

## 2013-04-03 DIAGNOSIS — K219 Gastro-esophageal reflux disease without esophagitis: Secondary | ICD-10-CM | POA: Insufficient documentation

## 2013-04-03 DIAGNOSIS — J111 Influenza due to unidentified influenza virus with other respiratory manifestations: Secondary | ICD-10-CM | POA: Insufficient documentation

## 2013-04-03 DIAGNOSIS — Z7982 Long term (current) use of aspirin: Secondary | ICD-10-CM | POA: Insufficient documentation

## 2013-04-03 DIAGNOSIS — Z79899 Other long term (current) drug therapy: Secondary | ICD-10-CM | POA: Diagnosis not present

## 2013-04-03 DIAGNOSIS — I10 Essential (primary) hypertension: Secondary | ICD-10-CM | POA: Diagnosis not present

## 2013-04-03 DIAGNOSIS — I129 Hypertensive chronic kidney disease with stage 1 through stage 4 chronic kidney disease, or unspecified chronic kidney disease: Secondary | ICD-10-CM | POA: Insufficient documentation

## 2013-04-03 DIAGNOSIS — R0609 Other forms of dyspnea: Secondary | ICD-10-CM | POA: Diagnosis not present

## 2013-04-03 LAB — CBC
HEMATOCRIT: 40.7 % (ref 36.0–46.0)
Hemoglobin: 13.2 g/dL (ref 12.0–15.0)
MCH: 29.9 pg (ref 26.0–34.0)
MCHC: 32.4 g/dL (ref 30.0–36.0)
MCV: 92.3 fL (ref 78.0–100.0)
Platelets: 214 10*3/uL (ref 150–400)
RBC: 4.41 MIL/uL (ref 3.87–5.11)
RDW: 14.1 % (ref 11.5–15.5)
WBC: 5.4 10*3/uL (ref 4.0–10.5)

## 2013-04-03 LAB — BASIC METABOLIC PANEL
BUN: 16 mg/dL (ref 6–23)
CALCIUM: 9.5 mg/dL (ref 8.4–10.5)
CO2: 26 mEq/L (ref 19–32)
CREATININE: 1.2 mg/dL — AB (ref 0.50–1.10)
Chloride: 104 mEq/L (ref 96–112)
GFR, EST AFRICAN AMERICAN: 50 mL/min — AB (ref >90)
GFR, EST NON AFRICAN AMERICAN: 43 mL/min — AB (ref >90)
Glucose, Bld: 111 mg/dL — ABNORMAL HIGH (ref 70–99)
POTASSIUM: 4.6 meq/L (ref 3.7–5.3)
Sodium: 142 mEq/L (ref 137–147)

## 2013-04-03 LAB — POCT I-STAT TROPONIN I: Troponin i, poc: 0 ng/mL (ref 0.00–0.08)

## 2013-04-03 MED ORDER — MOXIFLOXACIN HCL 400 MG PO TABS: 400.0000 mg | ORAL_TABLET | Freq: Every day | ORAL | Status: DC

## 2013-04-03 MED ORDER — PREDNISONE 50 MG PO TABS: 50.0000 mg | ORAL_TABLET | Freq: Every day | ORAL | Status: DC

## 2013-04-03 MED ORDER — OSELTAMIVIR PHOSPHATE 75 MG PO CAPS: 75.0000 mg | ORAL_CAPSULE | Freq: Two times a day (BID) | ORAL | Status: DC

## 2013-04-03 NOTE — ED Provider Notes (Signed)
CSN: [id number]     Arrival date & time 04/03/13  1236 History   First MD Initiated Contact with Patient 04/03/13 1247     Chief Complaint  Patient presents with  . Cough  . Shortness of Breath     (Consider location/radiation/quality/duration/timing/severity/associated sxs/prior Treatment) HPI Pt presenting with c/o cough, congestion and body aches.  She states symptoms began 2 days ago.  Feels that she needs to cough up mucous but is unable.  No chest pain, no difficulty breathing, no leg swelling.  No vomiting or abdominal pain.  No fever, but has had some intermittent chills.  Has continued to eat and drink normally.  States she has hx of asthma and used her inhaler this morning with moderate relief of her symptoms. She did get her flu shot this season.  There are no other associated systemic symptoms, there are no other alleviating or modifying factors.   Past Medical History  Diagnosis Date  . Diabetes mellitus   . Hypertension   . Renal failure (ARF), acute on chronic   . Hypercholesteremia   . Reflux    Past Surgical History  Procedure Laterality Date  . Renal mass excision    . Abdominal hysterectomy    . Appendectomy     Family History  Problem Relation Age of Onset  . Coronary artery disease    . Hypertension Mother    History  Substance Use Topics  . Smoking status: Never Smoker   . Smokeless tobacco: Never Used  . Alcohol Use: No   OB History   Grav Para Term Preterm Abortions TAB SAB Ect Mult Living                 Review of Systems ROS reviewed and all otherwise negative except for mentioned in HPI    Allergies  Sulfa antibiotics  Home Medications   Current Outpatient Rx  Name  Route  Sig  Dispense  Refill  . albuterol (PROVENTIL,VENTOLIN) 90 MCG/ACT inhaler   Inhalation   Inhale 1-2 puffs into the lungs every 4 (four) hours as needed. For shortness of breath and wheezing.         Marland Kitchen amLODipine (NORVASC) 2.5 MG tablet   Oral   Take 2.5 mg  by mouth daily.           Marland Kitchen aspirin 81 MG EC tablet   Oral   Take 81 mg by mouth daily.           Marland Kitchen atorvastatin (LIPITOR) 10 MG tablet   Oral   Take 10 mg by mouth daily.           . Cholecalciferol (VITAMIN D) 1000 UNITS capsule   Oral   Take 1,000 Units by mouth daily.           Marland Kitchen esomeprazole (NEXIUM) 40 MG capsule   Oral   Take 40 mg by mouth daily before breakfast.           . insulin NPH-insulin regular (NOVOLIN 70/30) (70-30) 100 UNIT/ML injection   Subcutaneous   Inject 55-70 Units into the skin 2 (two) times daily with a meal. 70 in am, 55 in pm         . lisinopril (PRINIVIL,ZESTRIL) 40 MG tablet   Oral   Take 40 mg by mouth daily.           . metoprolol tartrate (LOPRESSOR) 25 MG tablet   Oral   Take 25 mg by mouth 2 (two)  times daily.         . Multiple Vitamin (MULTIVITAMIN) tablet   Oral   Take 1 tablet by mouth daily.           . nitroGLYCERIN (NITROSTAT) 0.4 MG SL tablet   Sublingual   Place 0.4 mg under the tongue every 5 (five) minutes as needed for chest pain.         Marland Kitchen moxifloxacin (AVELOX) 400 MG tablet   Oral   Take 1 tablet (400 mg total) by mouth daily at 8 pm.   10 tablet   0   . oseltamivir (TAMIFLU) 75 MG capsule   Oral   Take 1 capsule (75 mg total) by mouth every 12 (twelve) hours.   10 capsule   0   . predniSONE (DELTASONE) 50 MG tablet   Oral   Take 1 tablet (50 mg total) by mouth daily.   5 tablet   0    BP 121/85  Pulse 76  Temp(Src) 98 F (36.7 C) (Oral)  Resp 18  Wt 156 lb (70.761 kg)  SpO2 96% Vitals reviewed Physical Exam Physical Examination: General appearance - alert, well appearing, and in no distress Mental status - alert, oriented to person, place, and time Eyes - no conjunctival injection, no scleral icterus Mouth - mucous membranes moist, pharynx normal without lesions Neck - supple, no significant adenopathy Chest - clear to auscultation, no wheezes, rales or rhonchi, symmetric  air entry, no increased respiratory effort Heart - normal rate, regular rhythm, normal S1, S2, no murmurs, rubs, clicks or gallops Abdomen - soft, nontender, nondistended, no masses or organomegaly Extremities - peripheral pulses normal, no pedal edema, no clubbing or cyanosis Skin - normal coloration and turgor, no rashes  ED Course  Procedures (including critical care time) Labs Review Labs Reviewed  BASIC METABOLIC PANEL - Abnormal; Notable for the following:    Glucose, Bld 111 (*)    Creatinine, Ser 1.20 (*)    GFR calc non Af Amer 43 (*)    GFR calc Af Amer 50 (*)    All other components within normal limits  CBC  POCT I-STAT TROPONIN I   Imaging Review Dg Chest 2 View  04/03/2013   CLINICAL DATA:  Dyspnea with exertion, cough, chills for 2 days, right-sided rib pain  EXAM: CHEST  2 VIEW  COMPARISON:  DG CHEST 2 VIEW dated 01/20/2013; DG CHEST 2 VIEW dated 01/13/2012; DG CHEST 2 VIEW dated 02/25/2009; DG CHEST 2 VIEW dated 10/13/2008; CT ANGIO CHEST W/CM &/OR WO/CM dated 10/13/2008  FINDINGS: Grossly unchanged cardiac silhouette and mediastinal contours with mild tortuosity of the thoracic aorta. Evaluation of the retrosternal clear space is obscured secondary to overlying soft tissues. No focal airspace opacities. No pleural effusion or pneumothorax. No evidence of edema. Grossly unchanged bones including lower cervical ACDF and paraspinal fusion, incompletely evaluated. Post right-sided rotator cuff repair. DDD within the mid thoracic spine.  IMPRESSION: No acute cardiopulmonary disease.   Electronically Signed   By: Sandi Mariscal M.D.   On: 04/03/2013 13:39    EKG Interpretation    Date/Time:  Saturday April 03 2013 13:04:53 EST Ventricular Rate:  74 PR Interval:  162 QRS Duration: 74 QT Interval:  372 QTC Calculation: 412 R Axis:   52 Text Interpretation:  Normal sinus rhythm Normal ECG No significant change since last tracing Confirmed by Canary Brim  MD, MARTHA [phone]) on 04/03/2013  1:57:37 PM  MDM   Final diagnoses:  Bronchitis  Influenza-like illness    Pt presenting with cough, body aches, subjective fever.  Some relief with her inhaler at home.  CXR reassuring, Labs also ressuring as well as EKG.  Will treat with tamiflu in case of influenza, however bronchitis is more likely clinically.  Discharged with strict return precautions.  Pt agreeable with plan.    Threasa Beards, MD 04/03/13 1600

## 2013-04-03 NOTE — ED Notes (Addendum)
Pt having dyspnea on exertion, cough, chills x 2 days.  Pt denies fever.  Non productive cough.  Pt also c/o headache and right rib pain.

## 2013-04-03 NOTE — Discharge Instructions (Signed)
Return to the ED with any concerns including difficulty breathing despite using albuterol every 4 hours, not drinking fluids, decreased urine output, vomiting and not able to keep down liquids or medications, decreased level of alertness/lethargy, or any other alarming symptoms °

## 2013-05-10 DIAGNOSIS — E119 Type 2 diabetes mellitus without complications: Secondary | ICD-10-CM | POA: Diagnosis not present

## 2013-05-10 DIAGNOSIS — I10 Essential (primary) hypertension: Secondary | ICD-10-CM | POA: Diagnosis not present

## 2013-05-11 DIAGNOSIS — E119 Type 2 diabetes mellitus without complications: Secondary | ICD-10-CM | POA: Diagnosis not present

## 2013-05-11 DIAGNOSIS — K5289 Other specified noninfective gastroenteritis and colitis: Secondary | ICD-10-CM | POA: Diagnosis not present

## 2013-05-30 IMAGING — CR DG CHEST 2V
2 series · 2 of 2 positions shown · non-contrast
Comparison: Two-view chest x-ray 04/25/2010, 07/05/2009, and
09/30/2008.

CLINICAL DATA: Productive cough.  Chest congestion.  Shortness of
breath.  History of asthma and hypertension.  History of left lung
cancer which was surgically removed.

CHEST - 2 VIEW 07/19/2010:

[w chest pa]
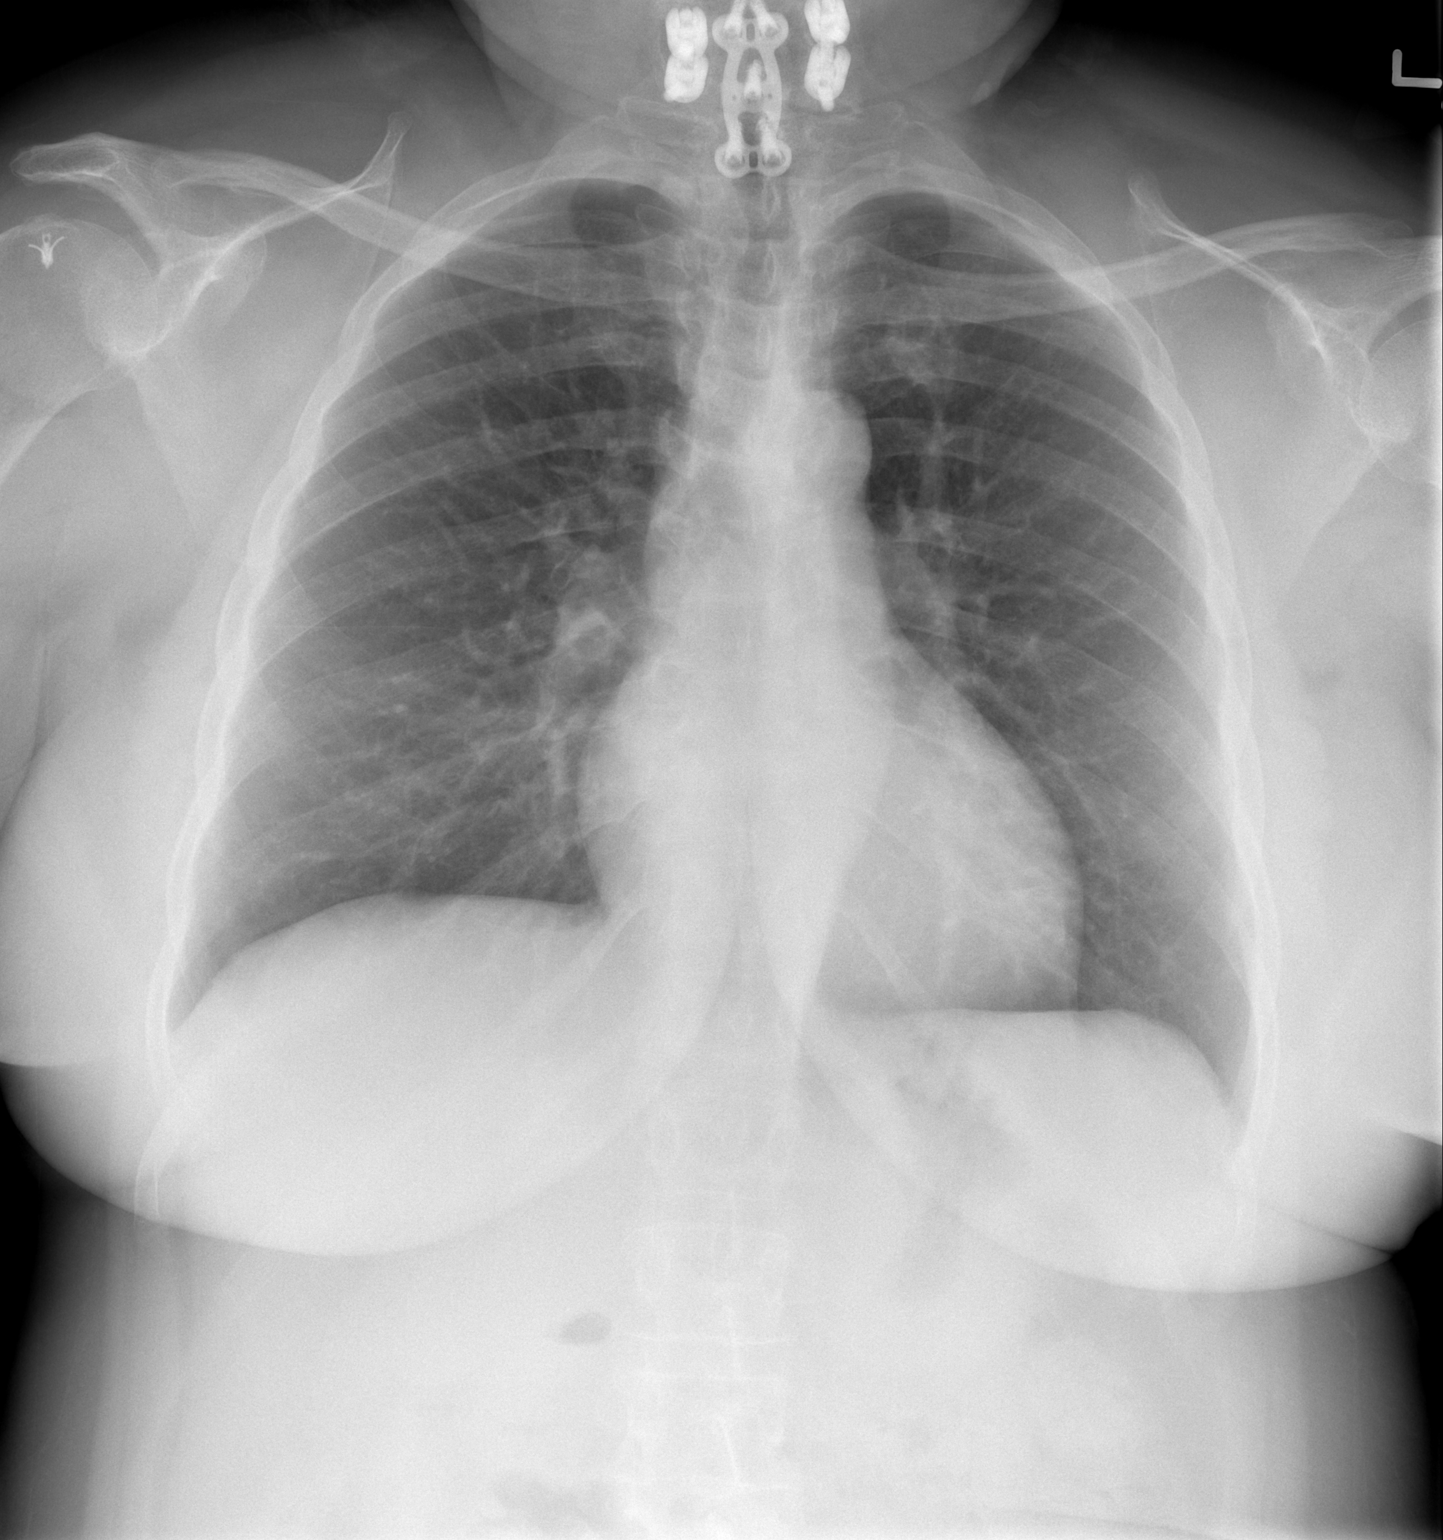

[w chest lat]
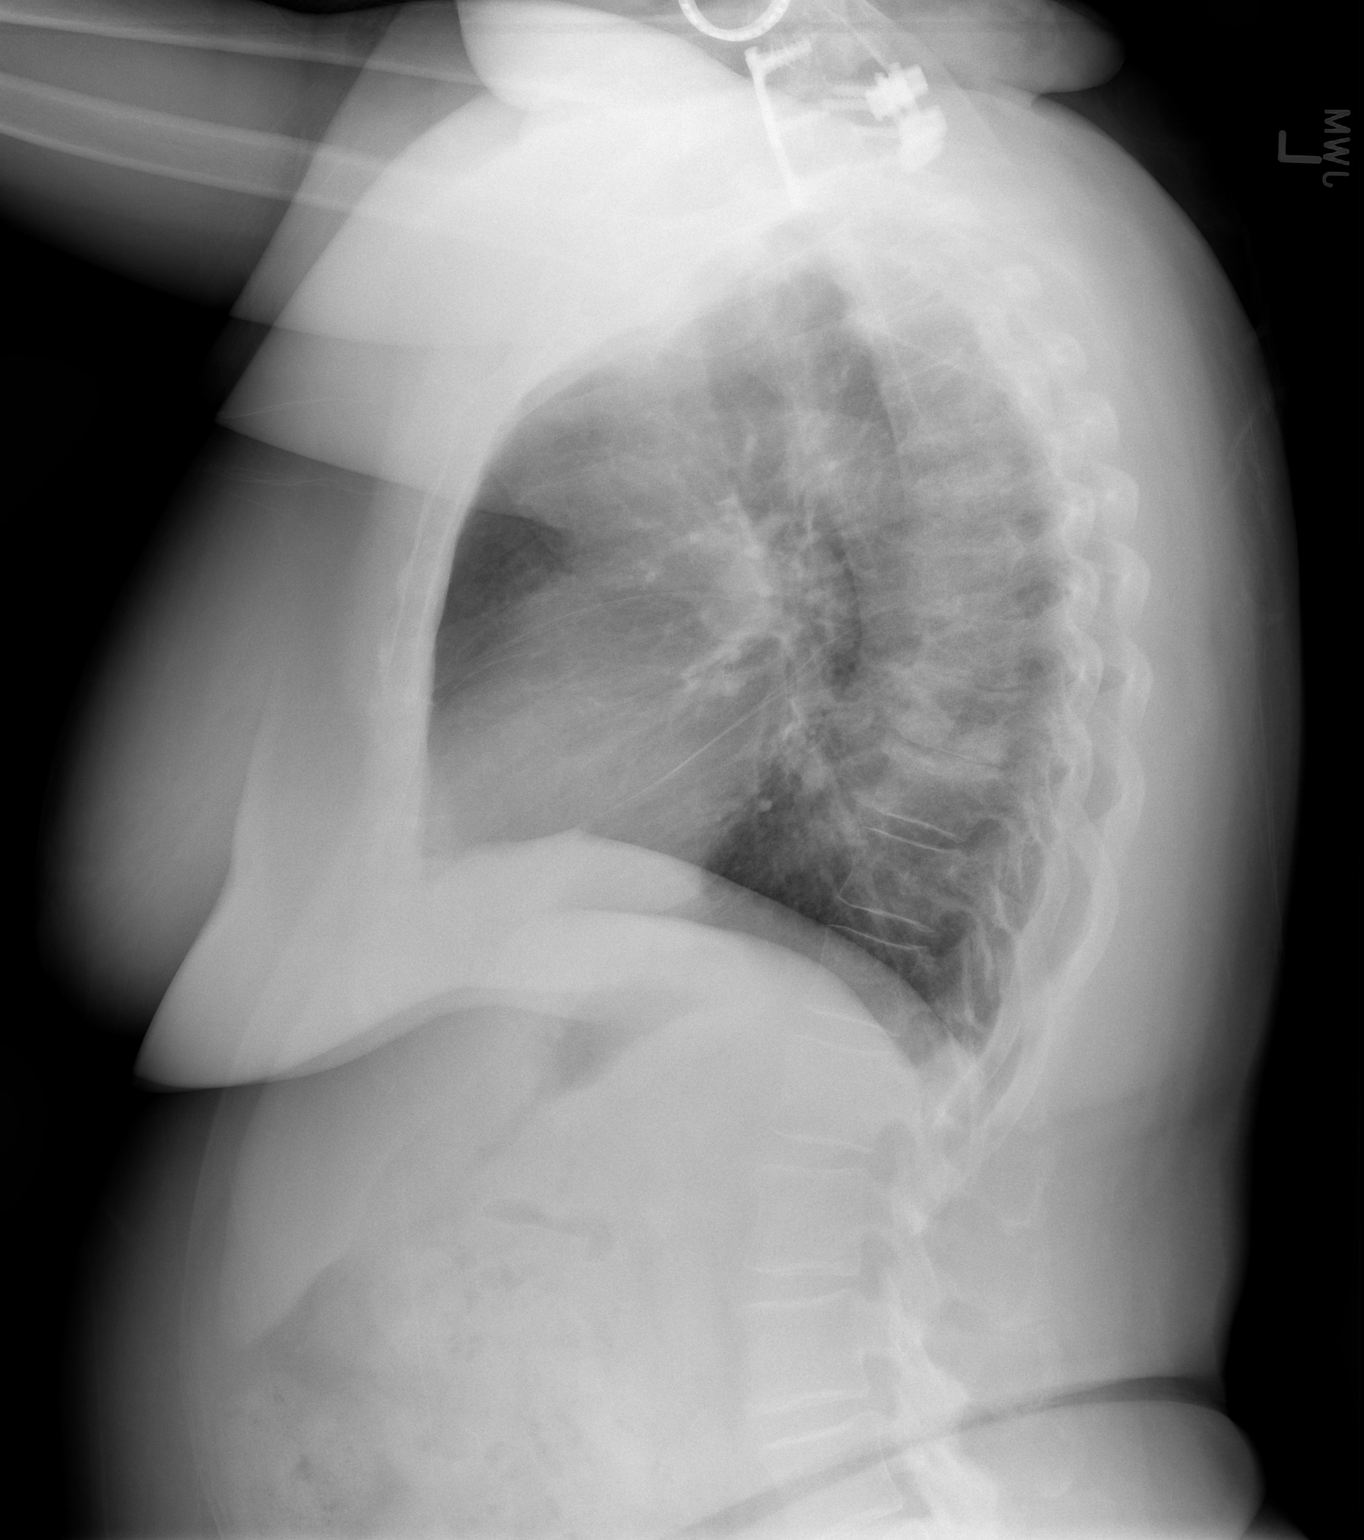

[2 of 2 positions shown; findings below may reference images not displayed]

FINDINGS: Cardiac silhouette normal in size, unchanged.  Thoracic
aorta mildly tortuous, unchanged.  Hilar and mediastinal contours
otherwise unremarkable.  Lungs clear.  Bronchovascular markings
normal.  No pleural effusions.  Degenerative changes involving the
mid thoracic spine with sclerosis in the endplates adjacent to the
T9-10 disc.
IMPRESSION: No acute cardiopulmonary disease.

## 2013-06-03 DIAGNOSIS — E119 Type 2 diabetes mellitus without complications: Secondary | ICD-10-CM | POA: Diagnosis not present

## 2013-06-03 DIAGNOSIS — N289 Disorder of kidney and ureter, unspecified: Secondary | ICD-10-CM | POA: Diagnosis not present

## 2013-06-09 ENCOUNTER — Ambulatory Visit (INDEPENDENT_AMBULATORY_CARE_PROVIDER_SITE_OTHER): Payer: Medicare Other | Admitting: Ophthalmology

## 2013-06-16 ENCOUNTER — Ambulatory Visit (INDEPENDENT_AMBULATORY_CARE_PROVIDER_SITE_OTHER): Payer: Medicare Other | Admitting: Ophthalmology

## 2013-06-16 DIAGNOSIS — H26499 Other secondary cataract, unspecified eye: Secondary | ICD-10-CM

## 2013-06-16 DIAGNOSIS — I10 Essential (primary) hypertension: Secondary | ICD-10-CM

## 2013-06-16 DIAGNOSIS — H35039 Hypertensive retinopathy, unspecified eye: Secondary | ICD-10-CM

## 2013-06-16 DIAGNOSIS — E1139 Type 2 diabetes mellitus with other diabetic ophthalmic complication: Secondary | ICD-10-CM

## 2013-06-16 DIAGNOSIS — E11319 Type 2 diabetes mellitus with unspecified diabetic retinopathy without macular edema: Secondary | ICD-10-CM | POA: Diagnosis not present

## 2013-06-16 DIAGNOSIS — E1165 Type 2 diabetes mellitus with hyperglycemia: Secondary | ICD-10-CM | POA: Diagnosis not present

## 2013-06-16 DIAGNOSIS — H43819 Vitreous degeneration, unspecified eye: Secondary | ICD-10-CM

## 2013-06-24 ENCOUNTER — Other Ambulatory Visit (INDEPENDENT_AMBULATORY_CARE_PROVIDER_SITE_OTHER): Payer: Medicare Other | Admitting: Ophthalmology

## 2013-06-24 DIAGNOSIS — H2700 Aphakia, unspecified eye: Secondary | ICD-10-CM | POA: Diagnosis not present

## 2013-06-24 DIAGNOSIS — H3581 Retinal edema: Secondary | ICD-10-CM

## 2013-07-06 ENCOUNTER — Encounter (HOSPITAL_COMMUNITY): Payer: Self-pay | Admitting: Emergency Medicine

## 2013-07-06 ENCOUNTER — Emergency Department (HOSPITAL_COMMUNITY): Payer: Medicare Other

## 2013-07-06 ENCOUNTER — Emergency Department (HOSPITAL_COMMUNITY)
Admission: EM | Admit: 2013-07-06 | Discharge: 2013-07-06 | Disposition: A | Discharge status: Home / Self Care | Payer: Medicare Other | Attending: Emergency Medicine | Admitting: Emergency Medicine

## 2013-07-06 DIAGNOSIS — Z7982 Long term (current) use of aspirin: Secondary | ICD-10-CM | POA: Insufficient documentation

## 2013-07-06 DIAGNOSIS — N189 Chronic kidney disease, unspecified: Secondary | ICD-10-CM | POA: Insufficient documentation

## 2013-07-06 DIAGNOSIS — K7689 Other specified diseases of liver: Secondary | ICD-10-CM | POA: Diagnosis not present

## 2013-07-06 DIAGNOSIS — I129 Hypertensive chronic kidney disease with stage 1 through stage 4 chronic kidney disease, or unspecified chronic kidney disease: Secondary | ICD-10-CM | POA: Insufficient documentation

## 2013-07-06 DIAGNOSIS — R109 Unspecified abdominal pain: Secondary | ICD-10-CM

## 2013-07-06 DIAGNOSIS — I10 Essential (primary) hypertension: Secondary | ICD-10-CM | POA: Diagnosis not present

## 2013-07-06 DIAGNOSIS — Z792 Long term (current) use of antibiotics: Secondary | ICD-10-CM | POA: Insufficient documentation

## 2013-07-06 DIAGNOSIS — Z794 Long term (current) use of insulin: Secondary | ICD-10-CM | POA: Diagnosis not present

## 2013-07-06 DIAGNOSIS — Z90710 Acquired absence of both cervix and uterus: Secondary | ICD-10-CM | POA: Diagnosis not present

## 2013-07-06 DIAGNOSIS — K219 Gastro-esophageal reflux disease without esophagitis: Secondary | ICD-10-CM | POA: Insufficient documentation

## 2013-07-06 DIAGNOSIS — Z79899 Other long term (current) drug therapy: Secondary | ICD-10-CM | POA: Diagnosis not present

## 2013-07-06 DIAGNOSIS — E7800 Pure hypercholesterolemia, unspecified: Secondary | ICD-10-CM | POA: Diagnosis not present

## 2013-07-06 DIAGNOSIS — E119 Type 2 diabetes mellitus without complications: Secondary | ICD-10-CM | POA: Diagnosis not present

## 2013-07-06 DIAGNOSIS — IMO0002 Reserved for concepts with insufficient information to code with codable children: Secondary | ICD-10-CM | POA: Diagnosis not present

## 2013-07-06 DIAGNOSIS — Z9889 Other specified postprocedural states: Secondary | ICD-10-CM | POA: Diagnosis not present

## 2013-07-06 DIAGNOSIS — Z85528 Personal history of other malignant neoplasm of kidney: Secondary | ICD-10-CM | POA: Insufficient documentation

## 2013-07-06 DIAGNOSIS — N390 Urinary tract infection, site not specified: Secondary | ICD-10-CM | POA: Insufficient documentation

## 2013-07-06 DIAGNOSIS — Z9089 Acquired absence of other organs: Secondary | ICD-10-CM | POA: Insufficient documentation

## 2013-07-06 DIAGNOSIS — Z905 Acquired absence of kidney: Secondary | ICD-10-CM | POA: Diagnosis not present

## 2013-07-06 HISTORY — DX: Malignant (primary) neoplasm, unspecified: C80.1

## 2013-07-06 LAB — URINALYSIS, ROUTINE W REFLEX MICROSCOPIC
BILIRUBIN URINE: NEGATIVE
Glucose, UA: NEGATIVE mg/dL
HGB URINE DIPSTICK: NEGATIVE
KETONES UR: NEGATIVE mg/dL
Nitrite: NEGATIVE
PROTEIN: NEGATIVE mg/dL
Specific Gravity, Urine: 1.017 (ref 1.005–1.030)
UROBILINOGEN UA: 0.2 mg/dL (ref 0.0–1.0)
pH: 5.5 (ref 5.0–8.0)

## 2013-07-06 LAB — COMPREHENSIVE METABOLIC PANEL
ALT: 17 U/L (ref 0–35)
AST: 15 U/L (ref 0–37)
Albumin: 3.7 g/dL (ref 3.5–5.2)
Alkaline Phosphatase: 82 U/L (ref 39–117)
BILIRUBIN TOTAL: 0.3 mg/dL (ref 0.3–1.2)
BUN: 15 mg/dL (ref 6–23)
CHLORIDE: 101 meq/L (ref 96–112)
CO2: 25 mEq/L (ref 19–32)
CREATININE: 0.99 mg/dL (ref 0.50–1.10)
Calcium: 9.4 mg/dL (ref 8.4–10.5)
GFR calc Af Amer: 63 mL/min — ABNORMAL LOW (ref >90)
GFR, EST NON AFRICAN AMERICAN: 55 mL/min — AB (ref >90)
Glucose, Bld: 189 mg/dL — ABNORMAL HIGH (ref 70–99)
Potassium: 4.2 mEq/L (ref 3.7–5.3)
Sodium: 139 mEq/L (ref 137–147)
Total Protein: 7.5 g/dL (ref 6.0–8.3)

## 2013-07-06 LAB — CBC WITH DIFFERENTIAL/PLATELET
BASOS ABS: 0 10*3/uL (ref 0.0–0.1)
Basophils Relative: 0 % (ref 0–1)
Eosinophils Absolute: 0.2 10*3/uL (ref 0.0–0.7)
Eosinophils Relative: 4 % (ref 0–5)
HEMATOCRIT: 38.1 % (ref 36.0–46.0)
HEMOGLOBIN: 12.6 g/dL (ref 12.0–15.0)
Lymphocytes Relative: 36 % (ref 12–46)
Lymphs Abs: 1.7 10*3/uL (ref 0.7–4.0)
MCH: 29.7 pg (ref 26.0–34.0)
MCHC: 33.1 g/dL (ref 30.0–36.0)
MCV: 89.9 fL (ref 78.0–100.0)
MONO ABS: 0.5 10*3/uL (ref 0.1–1.0)
MONOS PCT: 10 % (ref 3–12)
NEUTROS ABS: 2.3 10*3/uL (ref 1.7–7.7)
Neutrophils Relative %: 50 % (ref 43–77)
Platelets: 220 10*3/uL (ref 150–400)
RBC: 4.24 MIL/uL (ref 3.87–5.11)
RDW: 13.7 % (ref 11.5–15.5)
WBC: 4.6 10*3/uL (ref 4.0–10.5)

## 2013-07-06 LAB — URINE MICROSCOPIC-ADD ON

## 2013-07-06 LAB — CBG MONITORING, ED
GLUCOSE-CAPILLARY: 143 mg/dL — AB (ref 70–99)
Glucose-Capillary: 68 mg/dL — ABNORMAL LOW (ref 70–99)

## 2013-07-06 LAB — I-STAT CG4 LACTIC ACID, ED: LACTIC ACID, VENOUS: 1.12 mmol/L (ref 0.5–2.2)

## 2013-07-06 MED ORDER — IOHEXOL 300 MG/ML  SOLN
50.0000 mL | Freq: Once | INTRAMUSCULAR | Status: AC | PRN
  Administered 2013-07-06: 50 mL via ORAL

## 2013-07-06 MED ORDER — ONDANSETRON HCL 4 MG/2ML IJ SOLN
4.0000 mg | Freq: Once | INTRAMUSCULAR | Status: DC
  Filled 2013-07-06: qty 2

## 2013-07-06 MED ORDER — CEPHALEXIN 500 MG PO CAPS: 500.0000 mg | ORAL_CAPSULE | Freq: Three times a day (TID) | ORAL | Status: DC

## 2013-07-06 MED ORDER — IOHEXOL 300 MG/ML  SOLN
100.0000 mL | Freq: Once | INTRAMUSCULAR | Status: AC | PRN
Start: 2013-07-06 — End: 2013-07-06
  Administered 2013-07-06: 100 mL via INTRAVENOUS

## 2013-07-06 NOTE — ED Provider Notes (Signed)
73 y.o. Female with suprapubic pressure.  She has a history of renal cell carcinoma.  Patient with normal exam- abdomen- soft and nontender  I performed a history and physical examination of [name] and discussed her management with Ms. Sanders.  I agree with the history, physical, assessment, and plan of care, with the following exceptions: None  I was present for the following procedures: None Time Spent in Critical Care of the patient: None Time spent in discussions with the patient and family: [place], MD 07/06/13 [phone]

## 2013-07-06 NOTE — Discharge Instructions (Signed)
Take the prescribed medication as directed. Follow-up with Dr. Berdine Addison within the next few days to follow-up on urine culture. Return to the ED for new or worsening symptoms.

## 2013-07-06 NOTE — ED Notes (Signed)
Pt c/o lower abd pain x 4 days started getting worse this morning. Pt states she feels nauseous but no vomiting or diarrhea.

## 2013-07-06 NOTE — ED Provider Notes (Signed)
CSN: [id number]     Arrival date & time 07/06/13  0718 History   First MD Initiated Contact with Patient 07/06/13 [phone]     Chief Complaint  Patient presents with  . Abdominal Pain    lower     (Consider location/radiation/quality/duration/timing/severity/associated sxs/prior Treatment) Patient is a 73 y.o. female presenting with abdominal pain. The history is provided by the patient and medical records.  Abdominal Pain  This is a 73 year old female with past medical history significant for hypertension, disease, chronic kidney disease, hypercholesterolemia, renal carcinoma with prior excision, presenting to the ED for abdominal pain. Patient states pain began 4 days ago and is described as a "pressure" in her suprapubic region without radiation. The patient endorses associated nausea but denies vomiting.  States she's been having frequent urination, but is unable to fully empty her bladder. She denies dysuria or hematuria. Bowel movements have been varying between loose and normal. No melena or hematochezia. Patient also notes some pain in her left flank. She does have a history of renal cell carcinoma and underwent partial nephrectomy for removal. She is currently under surveillance with serial CT scans, unsure date of last scan.  No fevers or chills.  Previously followed by urology (Dr. Janice Norrie) for bladder emptying issues but has not seen him in several years.  VS stable on arrival.  Past Medical History  Diagnosis Date  . Diabetes mellitus   . Hypertension   . Renal failure (ARF), acute on chronic   . Hypercholesteremia   . Reflux    Past Surgical History  Procedure Laterality Date  . Renal mass excision    . Abdominal hysterectomy    . Appendectomy     Family History  Problem Relation Age of Onset  . Coronary artery disease    . Hypertension Mother    History  Substance Use Topics  . Smoking status: Never Smoker   . Smokeless tobacco: Never Used  . Alcohol Use: No   OB  History   Grav Para Term Preterm Abortions TAB SAB Ect Mult Living                 Review of Systems  Gastrointestinal: Positive for abdominal pain.      Allergies  Sulfa antibiotics  Home Medications   Prior to Admission medications   Medication Sig Start Date End Date Taking? Authorizing Provider  albuterol (PROVENTIL,VENTOLIN) 90 MCG/ACT inhaler Inhale 1-2 puffs into the lungs every 4 (four) hours as needed. For shortness of breath and wheezing.    Historical Provider, MD  amLODipine (NORVASC) 2.5 MG tablet Take 2.5 mg by mouth daily.      Historical Provider, MD  aspirin 81 MG EC tablet Take 81 mg by mouth daily.      Historical Provider, MD  atorvastatin (LIPITOR) 10 MG tablet Take 10 mg by mouth daily.      Historical Provider, MD  Cholecalciferol (VITAMIN D) 1000 UNITS capsule Take 1,000 Units by mouth daily.      Historical Provider, MD  esomeprazole (NEXIUM) 40 MG capsule Take 40 mg by mouth daily before breakfast.      Historical Provider, MD  insulin NPH-insulin regular (NOVOLIN 70/30) (70-30) 100 UNIT/ML injection Inject 55-70 Units into the skin 2 (two) times daily with a meal. 70 in am, 55 in pm    Historical Provider, MD  lisinopril (PRINIVIL,ZESTRIL) 40 MG tablet Take 40 mg by mouth daily.      Historical Provider, MD  metoprolol tartrate (LOPRESSOR)  25 MG tablet Take 25 mg by mouth 2 (two) times daily.    Historical Provider, MD  moxifloxacin (AVELOX) 400 MG tablet Take 1 tablet (400 mg total) by mouth daily at 8 pm. 04/03/13   Threasa Beards, MD  Multiple Vitamin (MULTIVITAMIN) tablet Take 1 tablet by mouth daily.      Historical Provider, MD  nitroGLYCERIN (NITROSTAT) 0.4 MG SL tablet Place 0.4 mg under the tongue every 5 (five) minutes as needed for chest pain.    Historical Provider, MD  oseltamivir (TAMIFLU) 75 MG capsule Take 1 capsule (75 mg total) by mouth every 12 (twelve) hours. 04/03/13   Threasa Beards, MD  predniSONE (DELTASONE) 50 MG tablet Take 1 tablet  (50 mg total) by mouth daily. 04/03/13   Threasa Beards, MD   BP 137/78  Pulse 78  Temp(Src) 98.1 F (36.7 C) (Oral)  Resp 18  SpO2 97%  Physical Exam  Nursing note and vitals reviewed. Constitutional: She is oriented to person, place, and time. She appears well-developed and well-nourished. No distress.  HENT:  Head: Normocephalic and atraumatic.  Mouth/Throat: Oropharynx is clear and moist.  Eyes: Conjunctivae and EOM are normal. Pupils are equal, round, and reactive to light.  Neck: Normal range of motion. Neck supple.  Cardiovascular: Normal rate, regular rhythm and normal heart sounds.   Pulmonary/Chest: Effort normal and breath sounds normal. No respiratory distress. She has no wheezes.  Abdominal: Soft. Bowel sounds are normal. There is no tenderness. There is CVA tenderness (mild, left). There is no rigidity and no guarding.  Abdomen soft, non-distended, no peritoneal signs "pressure" sensation over suprapubic region Mild left CVA tenderness  Musculoskeletal: Normal range of motion. She exhibits no edema.  Neurological: She is alert and oriented to person, place, and time.  Skin: Skin is warm and dry. She is not diaphoretic.  Psychiatric: She has a normal mood and affect.    ED Course  Procedures (including critical care time) Labs Review Labs Reviewed  COMPREHENSIVE METABOLIC PANEL - Abnormal; Notable for the following:    Glucose, Bld 189 (*)    GFR calc non Af Amer 55 (*)    GFR calc Af Amer 63 (*)    All other components within normal limits  URINALYSIS, ROUTINE W REFLEX MICROSCOPIC - Abnormal; Notable for the following:    APPearance CLOUDY (*)    Leukocytes, UA SMALL (*)    All other components within normal limits  URINE MICROSCOPIC-ADD ON - Abnormal; Notable for the following:    Bacteria, UA FEW (*)    All other components within normal limits  CBG MONITORING, ED - Abnormal; Notable for the following:    Glucose-Capillary 143 (*)    All other components  within normal limits  CBG MONITORING, ED - Abnormal; Notable for the following:    Glucose-Capillary 68 (*)    All other components within normal limits  URINE CULTURE  CBC WITH DIFFERENTIAL  I-STAT CG4 LACTIC ACID, ED    Imaging Review Ct Abdomen Pelvis W Contrast  07/06/2013   CLINICAL DATA:  Nausea with lower abdominal pain for 4 days  EXAM: CT ABDOMEN AND PELVIS WITH CONTRAST  TECHNIQUE: Multidetector CT imaging of the abdomen and pelvis was performed using the standard protocol following bolus administration of intravenous contrast.  CONTRAST:  14mL OMNIPAQUE IOHEXOL 300 MG/ML SOLN intravenously ; the patient also received oral contrast material  COMPARISON:  CT scan of the abdomen pelvis dated 14 January 2012.  FINDINGS:  The right kidney is normal in appearance. The left kidney exhibits stable changes from a previous partial nephrectomy of the posterior aspect of the midpole. No surrounding inflammatory or soft tissue masslike changes are demonstrated. On delayed images contrast within the renal collecting systems and proximal ureters appears normal.  The liver exhibits mildly decreased density diffusely consistent with fatty infiltration. The gallbladder is adequately distended with no definite calcified stones. Sludge cannot be excluded however. No pericholecystic inflammatory changes are demonstrated. The pancreas, spleen, adrenal glands, and abdominal aorta exhibit no acute or chronic abnormalities. The periaortic and pericaval regions appear normal.  The stomach is nondistended. The orally administered contrast is traversed the small bowel and reached as far distally as the proximal rectosigmoid. There is no evidence of a small or large bowel obstruction. The terminal ileum is normal in appearance. The appendix is surgically absent. There is no pericecal inflammatory change. There is no significant diverticulosis nor evidence of diverticulitis nor other forms of colitis.  The partially  distended urinary bladder is normal in appearance. The uterus is surgically absent. There are no adnexal masses and there is no free pelvic fluid. There is no inguinal hernia. There is a tiny fat containing umbilical hernia.  The bony pelvis and the lumbar spine exhibit no acute abnormalities. There is degenerative disc space narrowing at L5-S1.  The lung bases exhibit no acute abnormalities. Minimal subpleural interstitial densities present in both lower lobes laterally.  IMPRESSION: 1. No definite gallstones are demonstrated but sludge cannot be excluded. Gallbladder ultrasound is recommended. No acute abnormality of the liver or gallbladder or spleen is demonstrated. 2. No acute gastrointestinal abnormalities are demonstrated. 3. The appearance of the left kidney is stable status post previous partial nephrectomy. There are no findings within the abdomen or pelvis to suggest lymphadenopathy. The right kidney is normal in appearance.   Electronically Signed   By: David  Martinique   On: 07/06/2013 10:59   US Abdomen Limited  07/06/2013   CLINICAL DATA:  73 year old female with nausea and abdominal pain. Right upper quadrant pain. Initial encounter.  EXAM: US ABDOMEN LIMITED - RIGHT UPPER QUADRANT  COMPARISON:  CT Abdomen and Pelvis 1029 hr the same day and earlier.  FINDINGS: Gallbladder:  No sludge, gallstones or wall thickening visualized. No sonographic Murphy sign noted. No pericholecystic fluid.  Common bile duct:  Diameter: 6 mm, normal  Liver:  Increased echogenicity diffusely. No intrahepatic ductal dilatation. No discrete liver lesion.  Other findings:  Negative visible right kidney.  IMPRESSION: Negative gallbladder.  Hepatic steatosis.   Electronically Signed   By: Lars Pinks M.D.   On: 07/06/2013 13:29     EKG Interpretation None      MDM   Final diagnoses:  Abdominal pain  UTI (lower urinary tract infection)   73 year old female complaining of abdominal pain described as a pressure in her  suprapubic region and left flank. She does have prior history of left renal cell carcinoma and is previously underwent partial nephrectomy for this. She is currently under surveillance.  Overall she is afebrile and nontoxic appearing.  Her abdominal exam is not overly impressive however given her history will obtain basic labs and CT abdomen and pelvis to check for recurrent mass.  U/a pending.  Labs as above, renal function preserved.  UA with small leukocytes and few bacteria, sent for culture. CT with questionable gallbladder sludge although no gallstones identified, recommended abd u/s for further evaluation, however no recurrence of left renal mass.  Ultrasound was obtained which is negative.  The patient will be started on Keflex pending urine culture results. She will follow up with her primary care physician in the next few days.  Discussed plan with patient, he/she acknowledged understanding and agreed with plan of care.  Return precautions given for new or worsening symptoms.  Discussed with Dr. Jeanell Sparrow who personally evaluated pt and agrees with plan of care.  Larene Pickett, PA-C 07/06/13 1440

## 2013-07-06 NOTE — ED Notes (Addendum)
With Pt's verbal permission, this RN spoke with the Pt's daughter.  Daughter was concerned that the Pt took insulin, but has had nothing to eat or drink.  Daughter was informed that the Pt took her insulin w/o consulting staff and we are awaiting the [place] results, before we can give her anything to eat or drink.  Also, we are continually checking her CBG and we will address it, if she becomes hypoglycemic.

## 2013-07-07 ENCOUNTER — Encounter (INDEPENDENT_AMBULATORY_CARE_PROVIDER_SITE_OTHER): Payer: Medicare Other | Admitting: Ophthalmology

## 2013-07-07 DIAGNOSIS — H2700 Aphakia, unspecified eye: Secondary | ICD-10-CM

## 2013-07-07 LAB — CBG MONITORING, ED: Glucose-Capillary: 180 mg/dL — ABNORMAL HIGH (ref 70–99)

## 2013-07-08 DIAGNOSIS — E119 Type 2 diabetes mellitus without complications: Secondary | ICD-10-CM | POA: Diagnosis not present

## 2013-07-08 DIAGNOSIS — R109 Unspecified abdominal pain: Secondary | ICD-10-CM | POA: Diagnosis not present

## 2013-07-09 LAB — URINE CULTURE: Special Requests: NORMAL

## 2013-07-13 ENCOUNTER — Telehealth (HOSPITAL_BASED_OUTPATIENT_CLINIC_OR_DEPARTMENT_OTHER): Payer: Self-pay | Admitting: Emergency Medicine

## 2013-07-13 NOTE — Telephone Encounter (Signed)
Rx for Cipro 500 mg PO BID x 7 days, prescribed by Domenic Moras PA-C, called in to [facility] on [address] [phone])

## 2013-08-05 DIAGNOSIS — N390 Urinary tract infection, site not specified: Secondary | ICD-10-CM | POA: Diagnosis not present

## 2013-08-05 DIAGNOSIS — N39498 Other specified urinary incontinence: Secondary | ICD-10-CM | POA: Diagnosis not present

## 2013-08-05 DIAGNOSIS — E119 Type 2 diabetes mellitus without complications: Secondary | ICD-10-CM | POA: Diagnosis not present

## 2013-08-19 IMAGING — CT CT ABD-PELV W/O CM
1 series · 14 of 20 positions shown, 19 images · non-contrast
Comparison: 07/28/2009and 02/25/2009

CLINICAL DATA: Abdominal pain.  Low back pain.

CT ABDOMEN AND PELVIS WITHOUT CONTRAST
TECHNIQUE: Multidetector CT imaging of the abdomen and pelvis was
performed following the standard protocol without intravenous
contrast.

[Series 4: lung windows · axial · 0.64mm/px · z∈[-124,-39]mm · 14 of 20 slices shown, 19 images]
[im 2/20  soft-tissue]
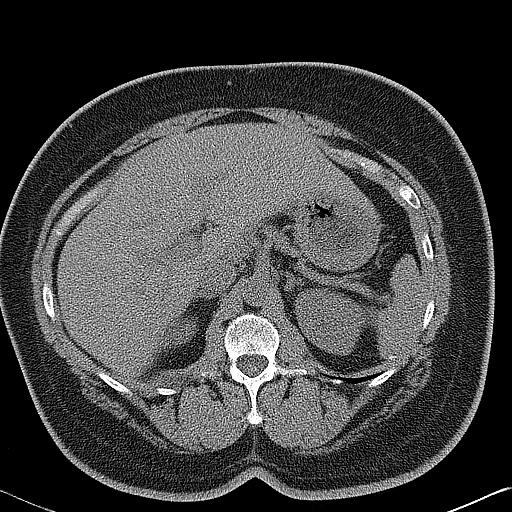
[im 2/20  bone]
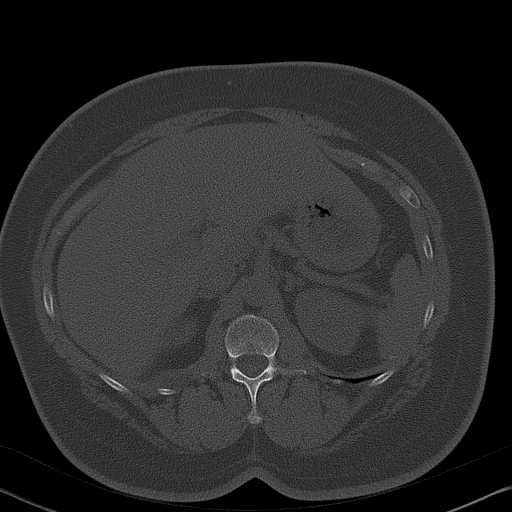
[im 4/20  soft-tissue]
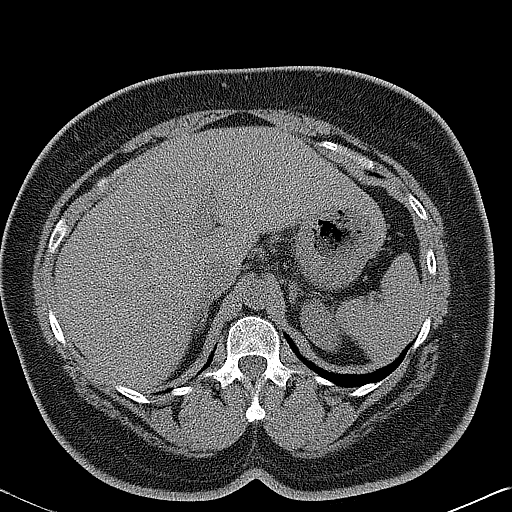
[im 5/20  soft-tissue]
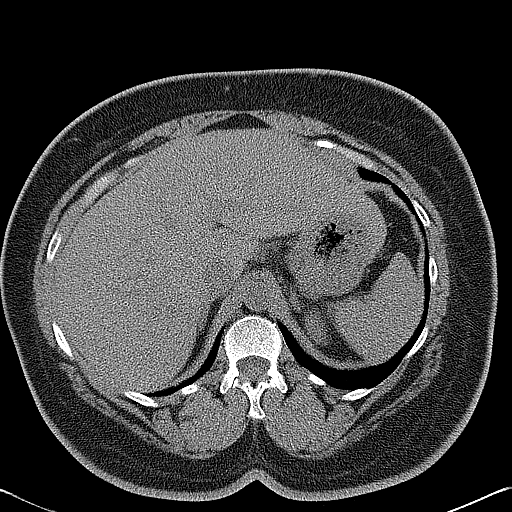
[im 6/20  soft-tissue]
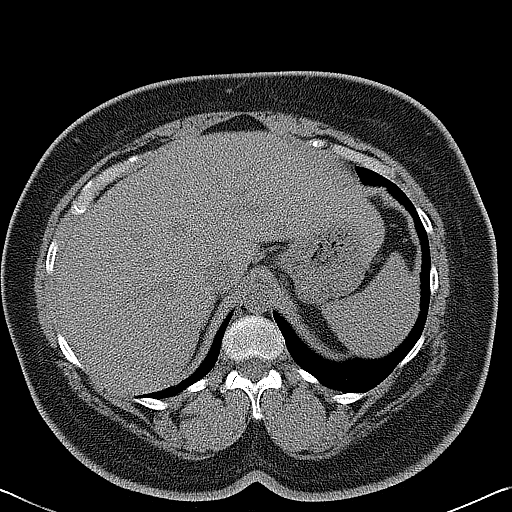
[im 8/20  soft-tissue]
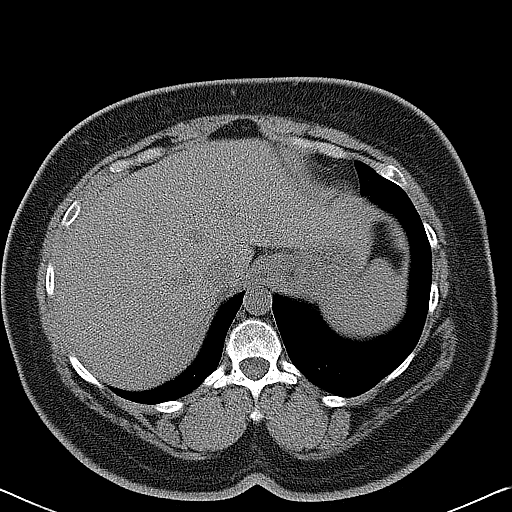
[im 9/20  soft-tissue]
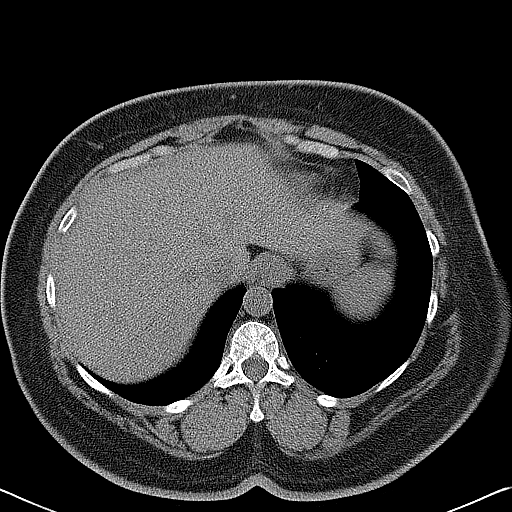
[im 11/20  soft-tissue]
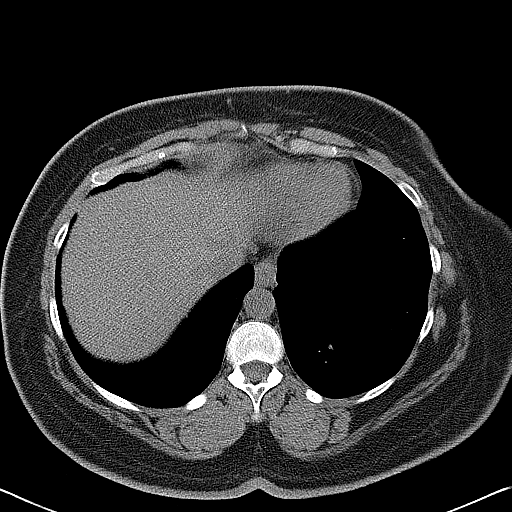
[im 12/20  soft-tissue]
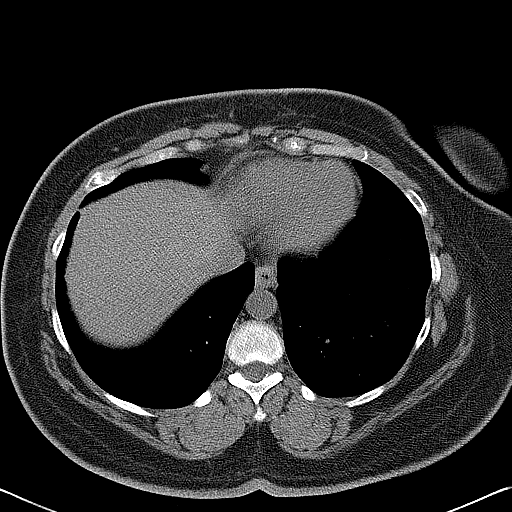
[im 13/20  soft-tissue]
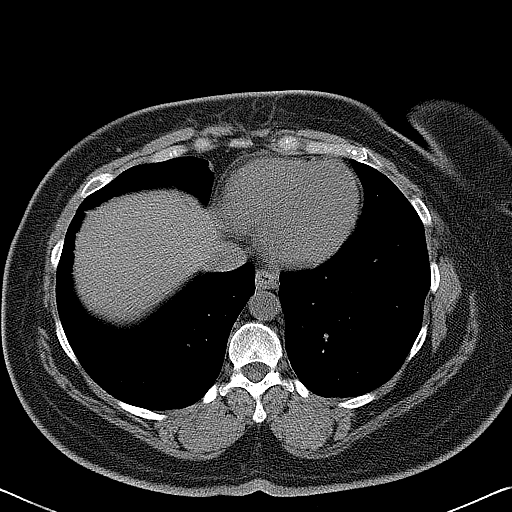
[im 13/20  bone]
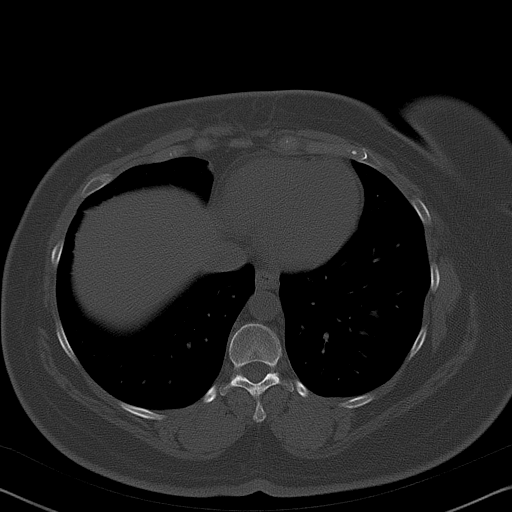
[im 15/20  soft-tissue]
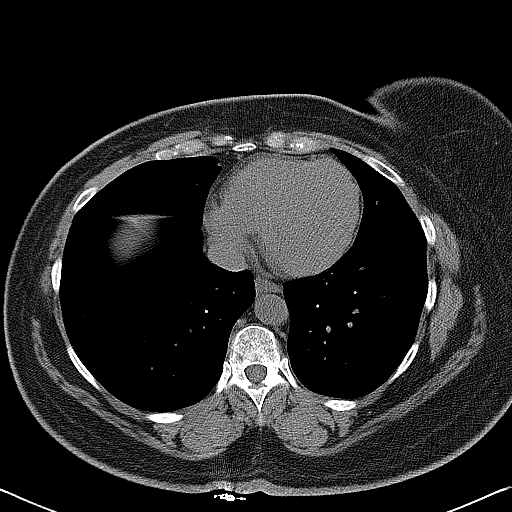
[im 16/20  soft-tissue]
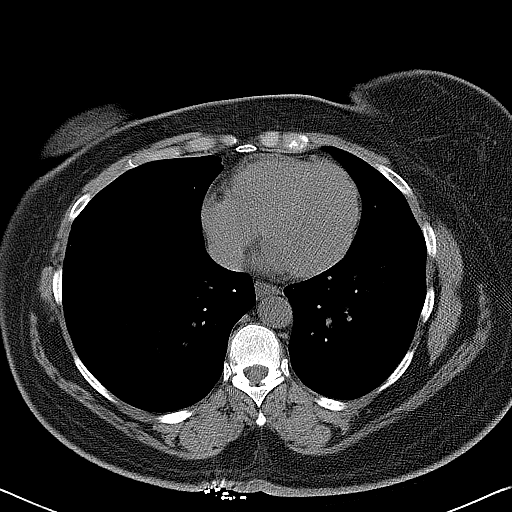
[im 16/20  lung]
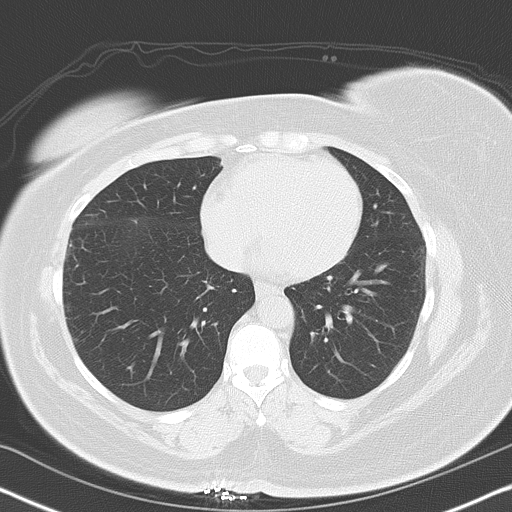
[im 17/20  soft-tissue]
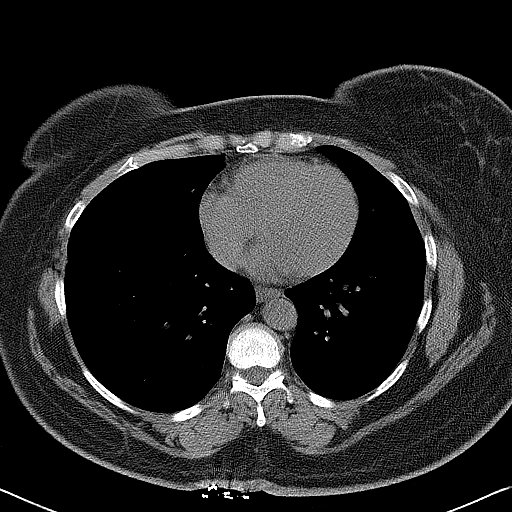
[im 17/20  lung]
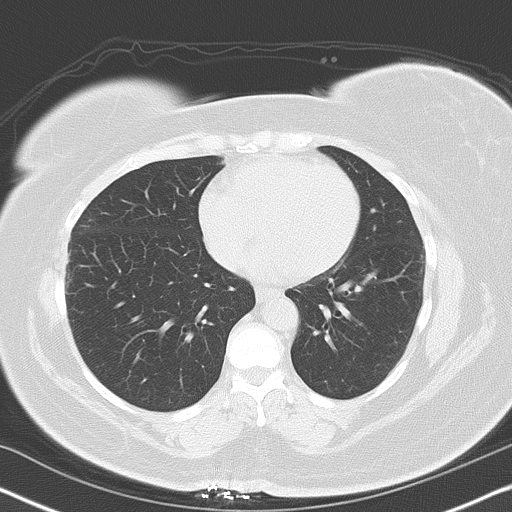
[im 18/20  lung]
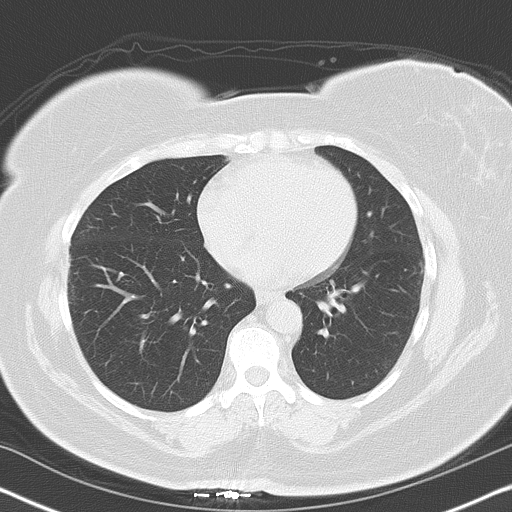
[im 19/20  soft-tissue]
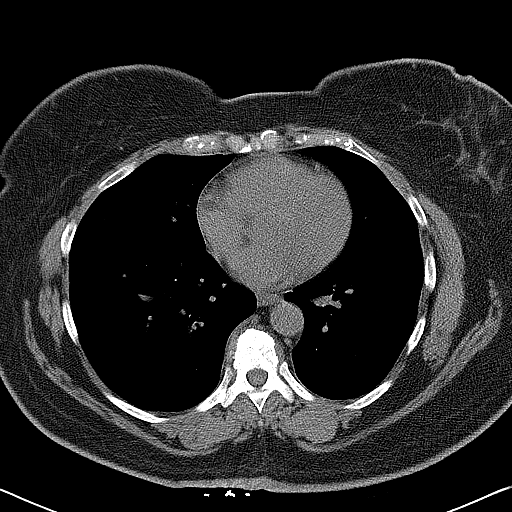
[im 19/20  lung]
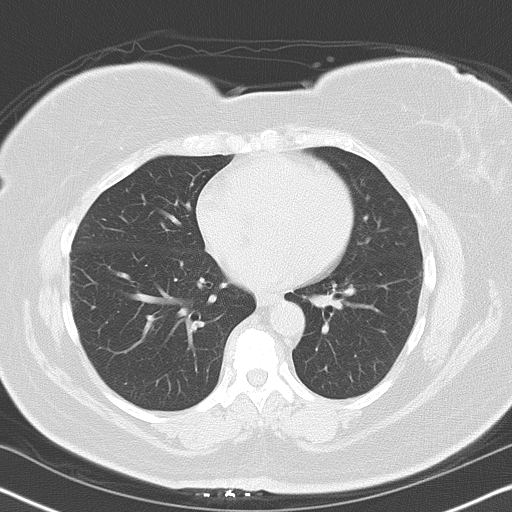

[14 of 20 positions shown; findings below may reference images not displayed]

FINDINGS: The liver and spleen have normal uninfused features.  The
stomach, duodenum, pancreas, gallbladder, and adrenal glands are
unremarkable.

The right kidney is normal.  The defect in the interpolar region of
the left kidney posteriorly is compatible with a reported history
of prior partial nephrectomy. Some subtle high attenuation along
the lateral capsule of the left kidney is compatible with scarring.
The patient also has some foci of slightly increased attenuation
adjacent to the left renal pelvis and proximal left ureter.  The
tiny focus adjacent to the left ureter somewhat mimics a proximal
ureteral stone, but the appearance is unchanged compared to the to
previous studies.

No abdominal aortic aneurysm.  No free fluid or lymphadenopathy in
the abdomen.  The abdominal bowel loops are unremarkable.

Imaging through the pelvis shows no free fluid.  No pelvic sidewall
lymphadenopathy.  No ureteral or bladder stones.

The uterus is surgically absent.  There is no adnexal mass.  The
terminal ileum is normal. The appendix is not visualized, but there
is no edema or inflammation in the region of the cecum.

Bone windows reveal no worrisome lytic or sclerotic osseous
lesions.
IMPRESSION: No acute findings in the abdomen or pelvis.  Specifically, no
evidence for urinary stones.

Defect in the left kidney consistent with history of previous
partial left nephrectomy for neoplasm.

## 2013-09-04 IMAGING — US US RENAL
1 series · 14 of 25 positions shown · non-contrast
Comparison: CT scan 10/08/2010.

CLINICAL DATA: Renal insufficiency.  History of partial left
nephrectomy.

RENAL/URINARY TRACT ULTRASOUND COMPLETE

[Series 1: us renal · 0.24mm/px · 14 of 35 slices shown]
[im 1/35]
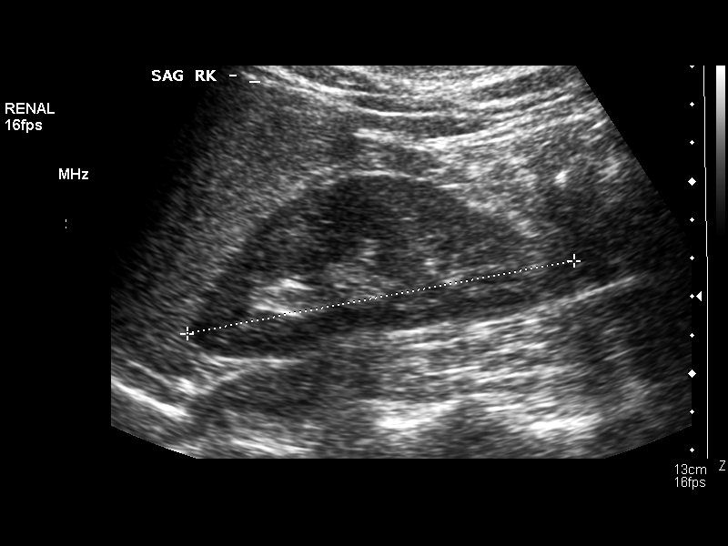
[im 3/35]
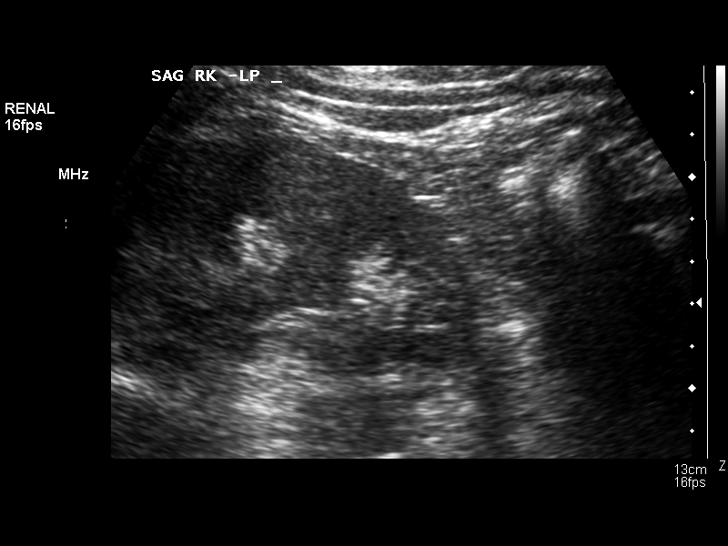
[im 6/35]
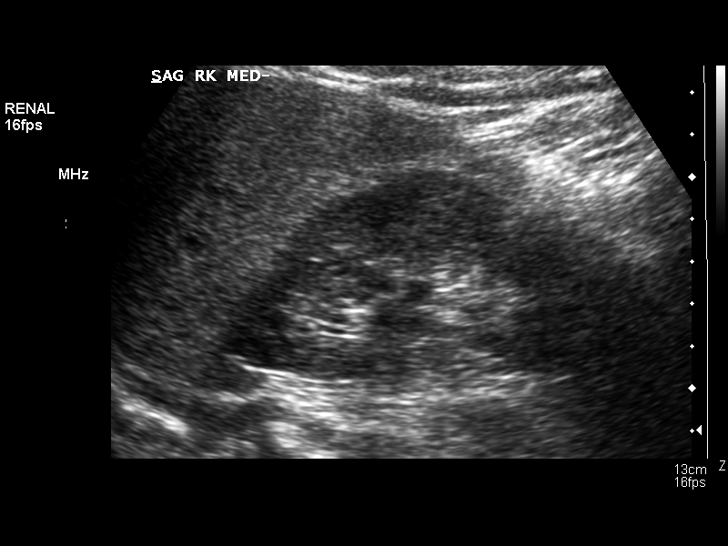
[im 9/35]
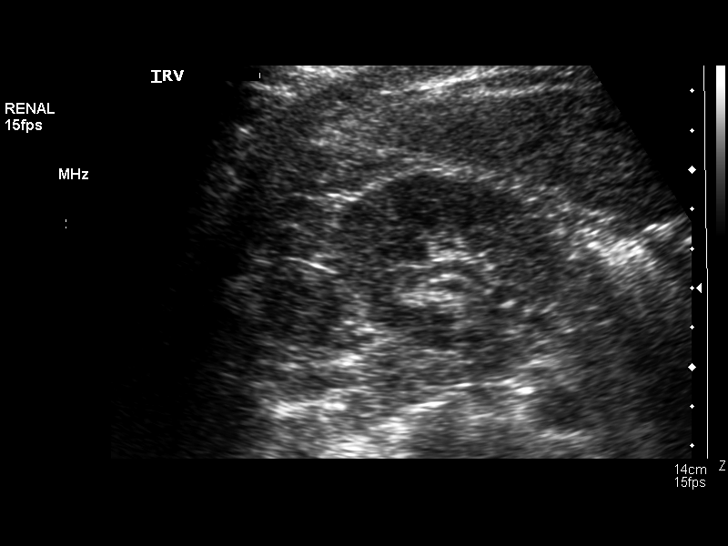
[im 12/35]
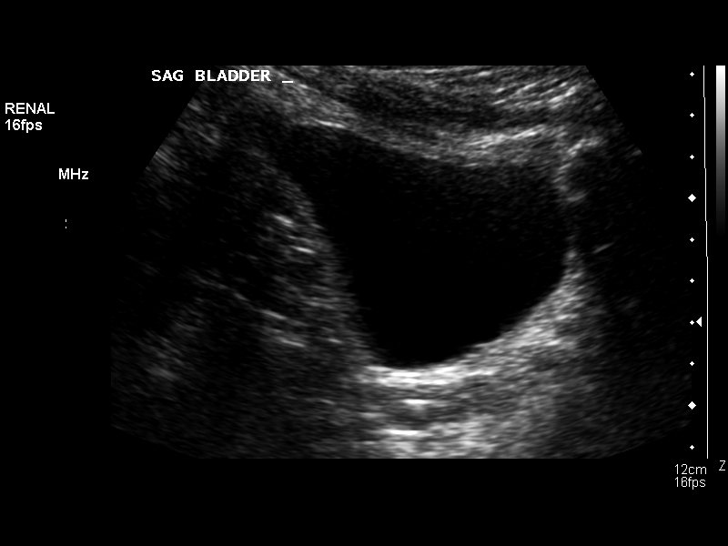
[im 13/35]
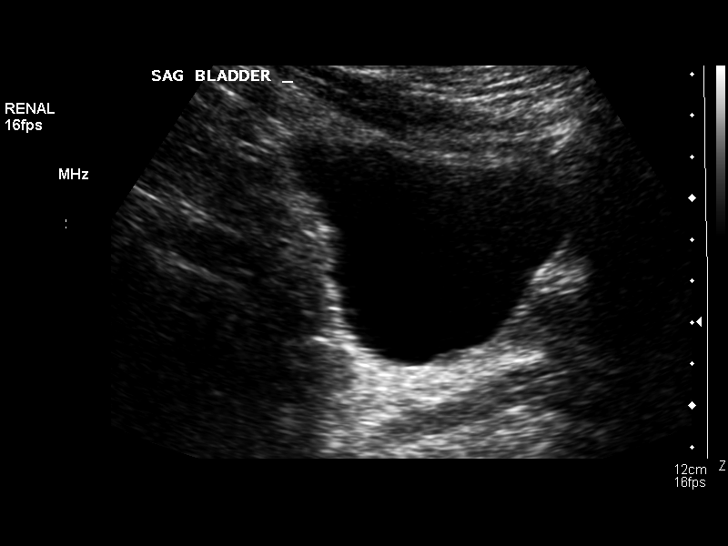
[im 16/35]
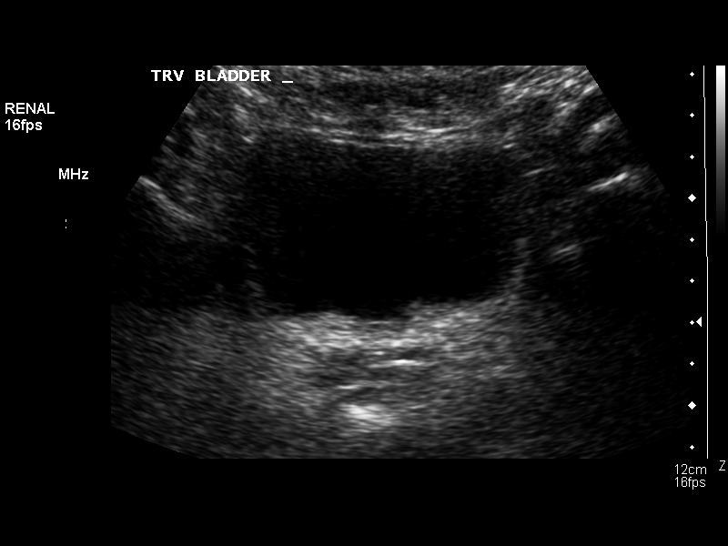
[im 19/35]
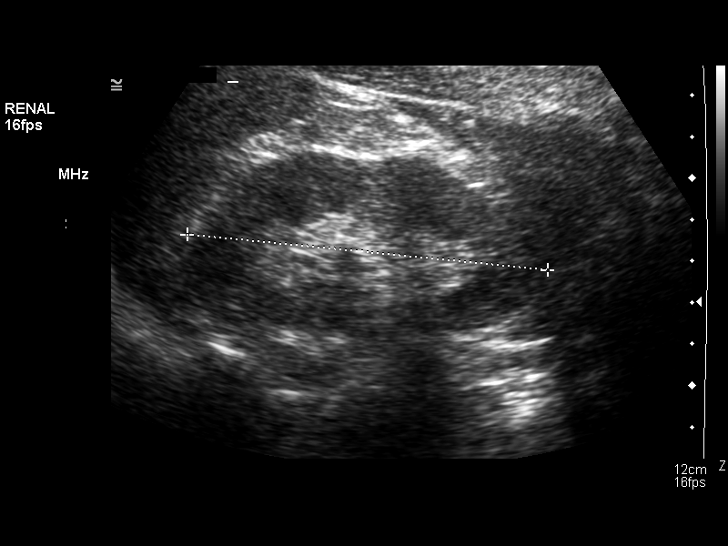
[im 22/35]
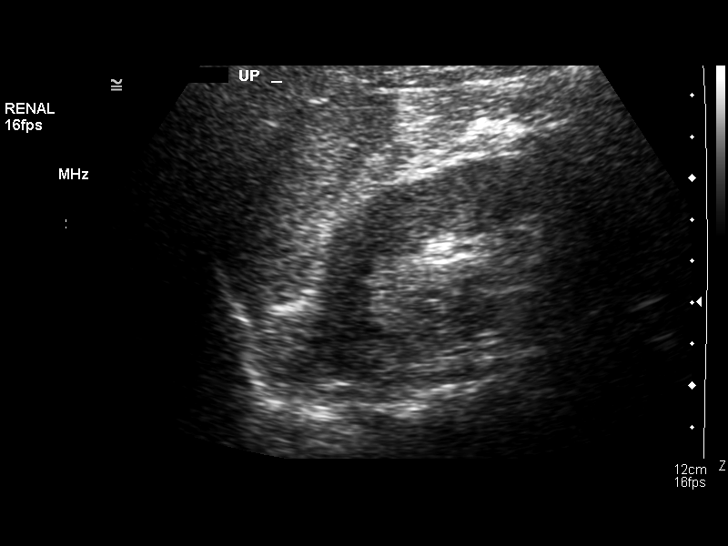
[im 23/35]
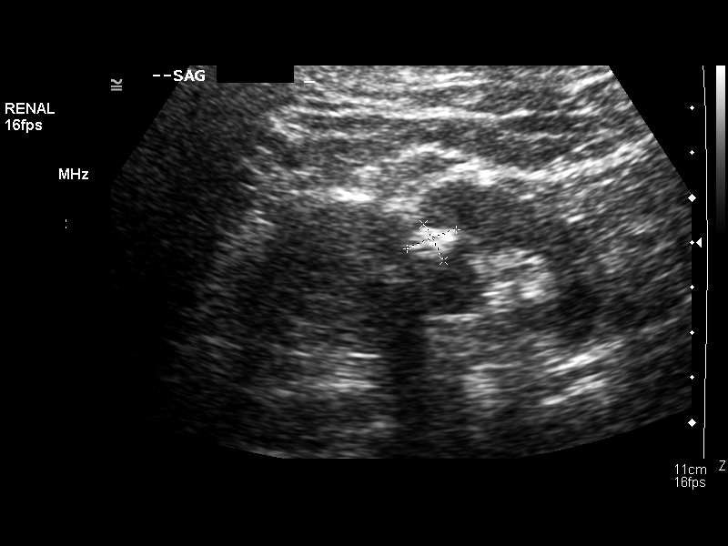
[im 26/35]
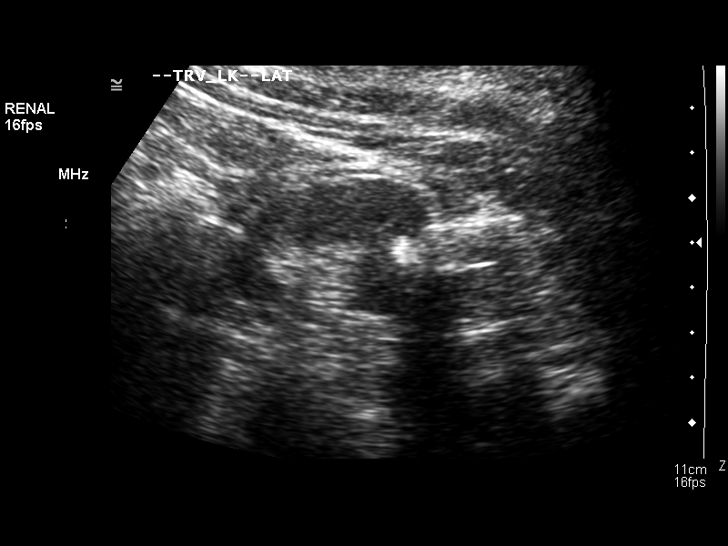
[im 29/35]
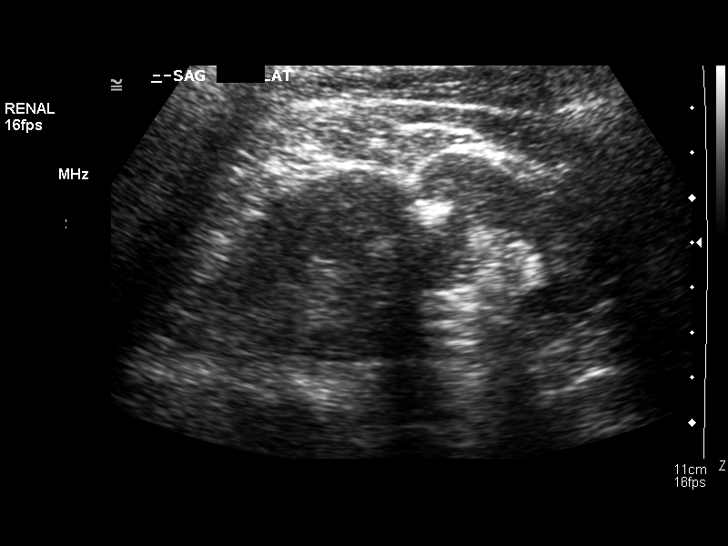
[im 32/35]
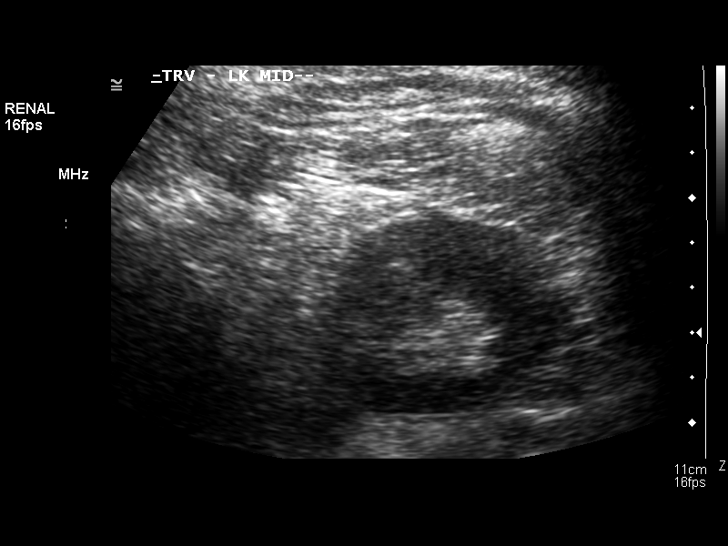
[im 35/35]
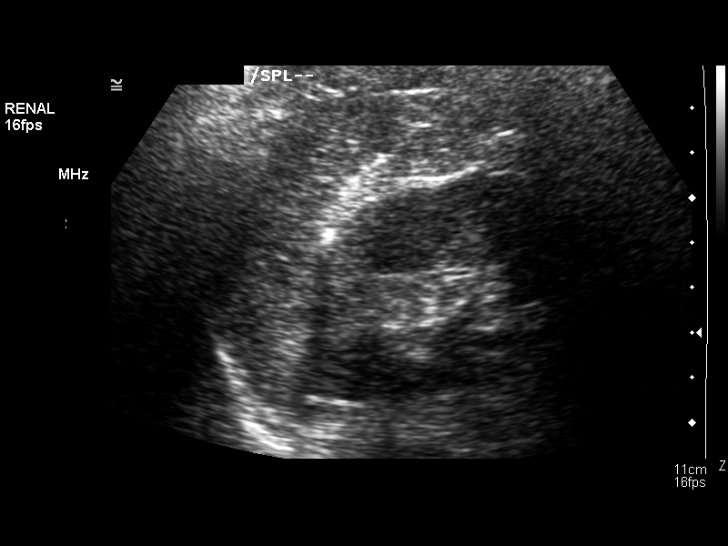

[14 of 25 positions shown; findings below may reference images not displayed]

FINDINGS: Right Kidney:  10.2 cm in length. Normal renal cortical thickness
and echogenicity without focal lesions or hydronephrosis.

Left Kidney:  8.7 cm in length.  Scarring changes noted in the
interpolar region.  Normal renal cortical thickness and
echogenicity.  No hydronephrosis.

Bladder:  Normal
IMPRESSION: 1.  Scarring changes involving the left kidney.
2.  Normal renal cortical thickness and echogenicity without focal
lesions or hydronephrosis.

## 2013-09-05 IMAGING — CR DG CHEST 2V
2 series · 2 of 2 positions shown · non-contrast
Comparison: 07/19/2010 and earlier.

CLINICAL DATA: 70-year-old female with renal cell carcinoma.  Chest
pain.

CHEST - 2 VIEW

[w chest pa]
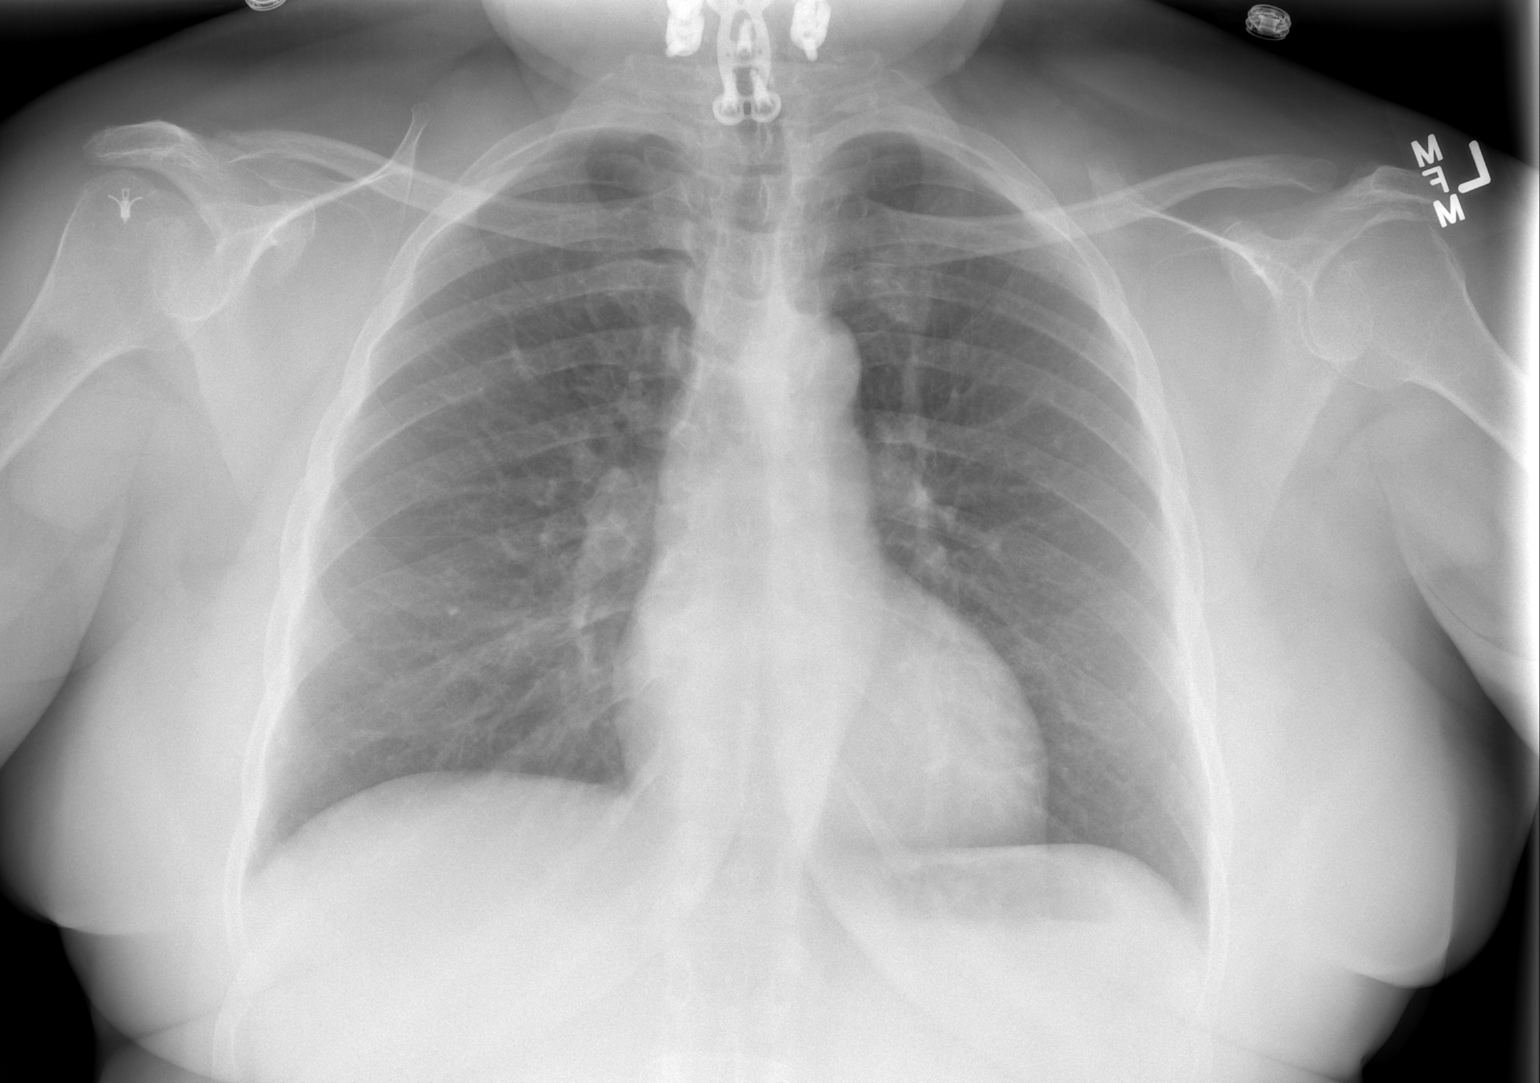

[w chest lat]
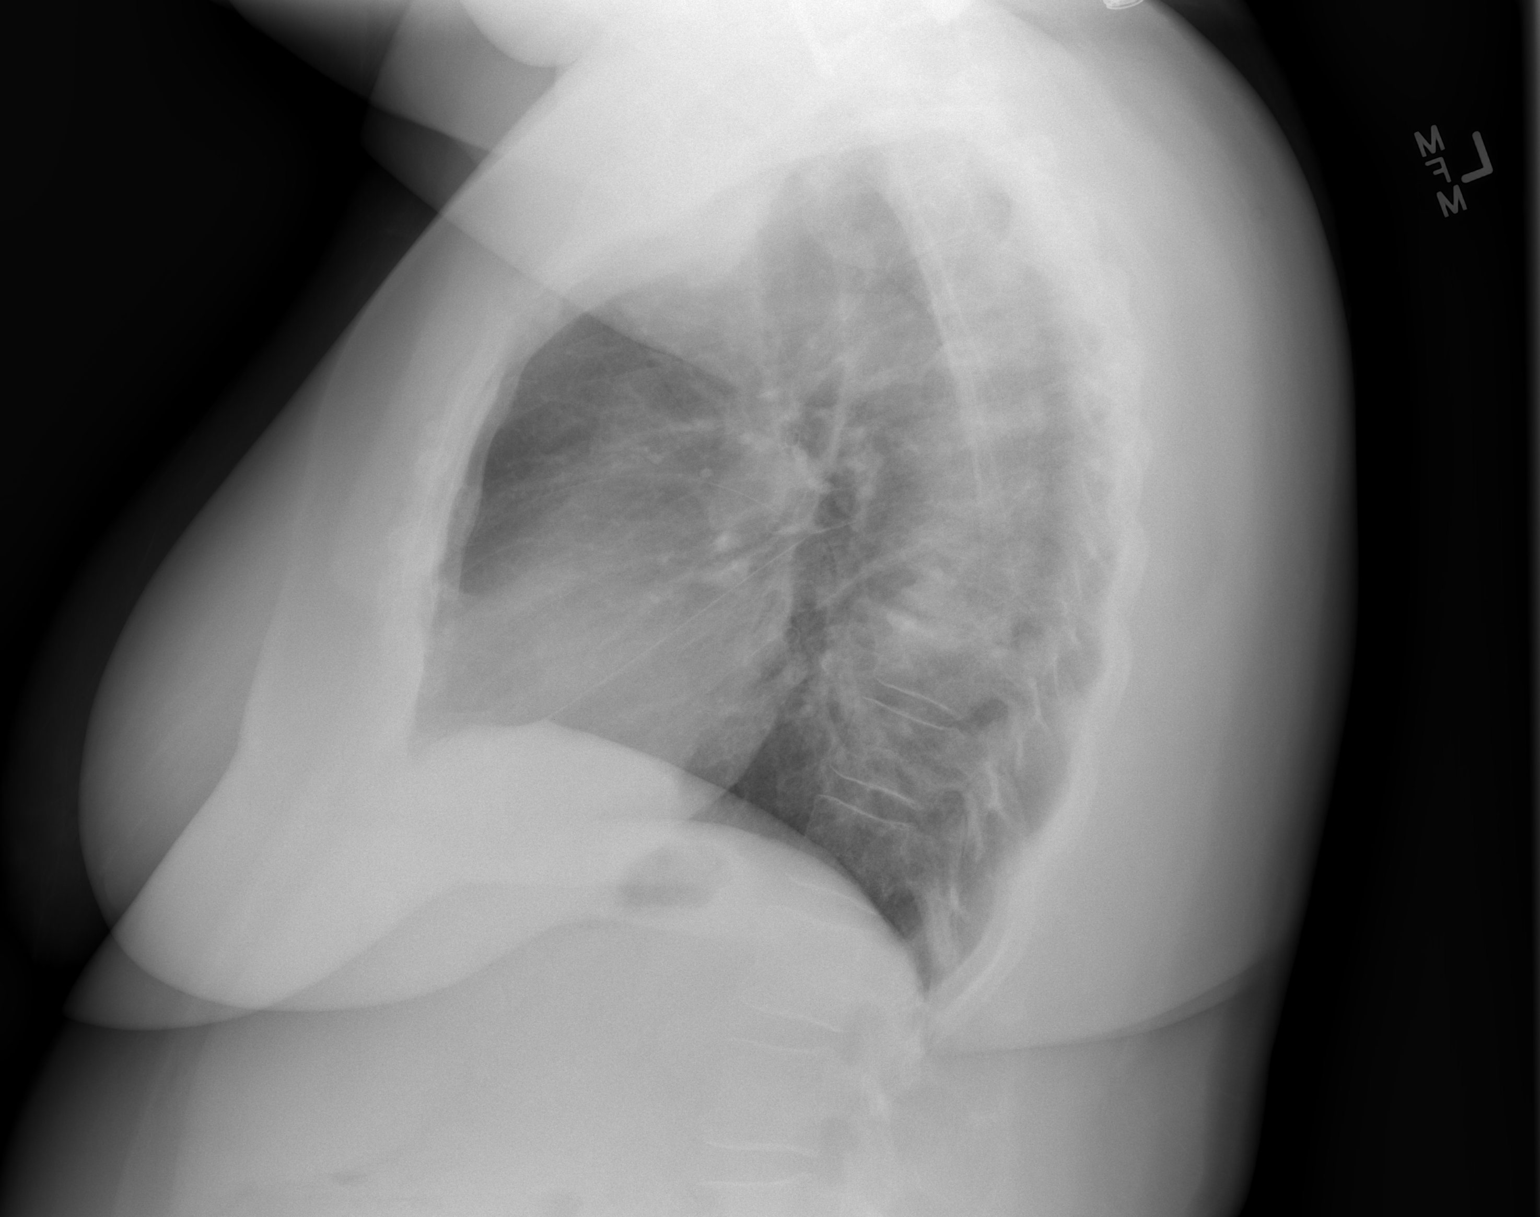

[2 of 2 positions shown; findings below may reference images not displayed]

FINDINGS: Postoperative changes to the cervical spine partially
visible.  Stable postoperative change of right humeral head.
Stable lung volumes.  Cardiac size and mediastinal contours are
within normal limits.  Visualized tracheal air column is within
normal limits.  No pneumothorax, pulmonary edema, pleural effusion,
consolidation, or pulmonary mass. Stable visualized osseous
structures.
IMPRESSION: No acute or metastatic cardiopulmonary abnormality.

## 2013-10-07 IMAGING — CR DG LUMBAR SPINE COMPLETE 4+V
5 series · 5 of 5 positions shown · non-contrast
Comparison: CTs of the abdomen pelvis 10/08/2010 earlier.

CLINICAL DATA: 70-year-old female with pain radiating down the
spine.

LUMBAR SPINE - COMPLETE 4+ VIEW

[t l-spine a.p.]
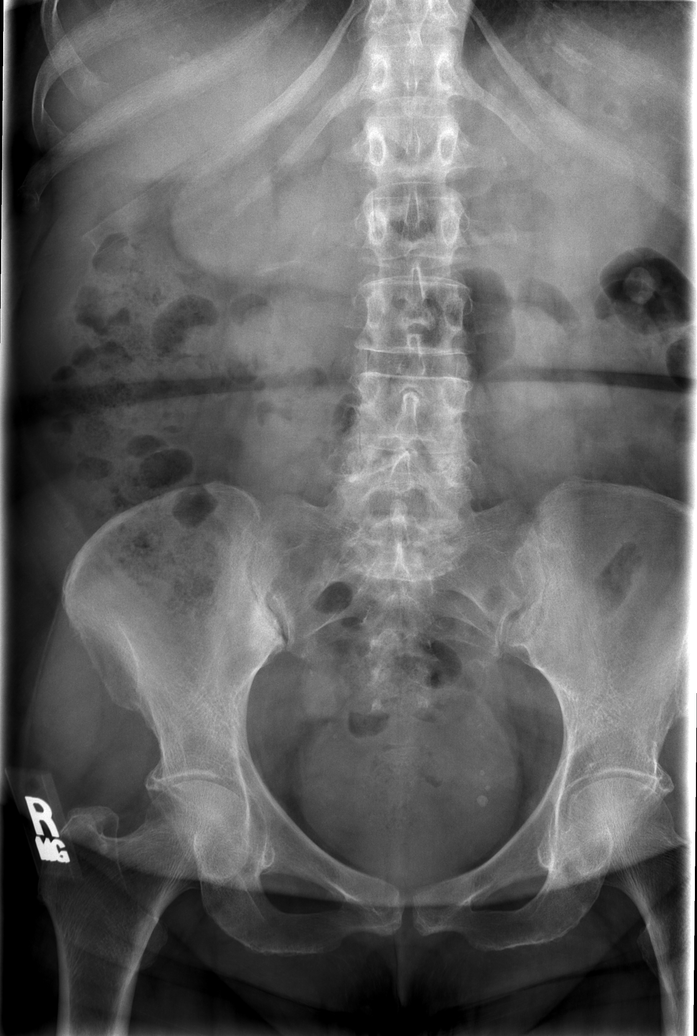

[t l-spine oblique exposure (1 of 2)]
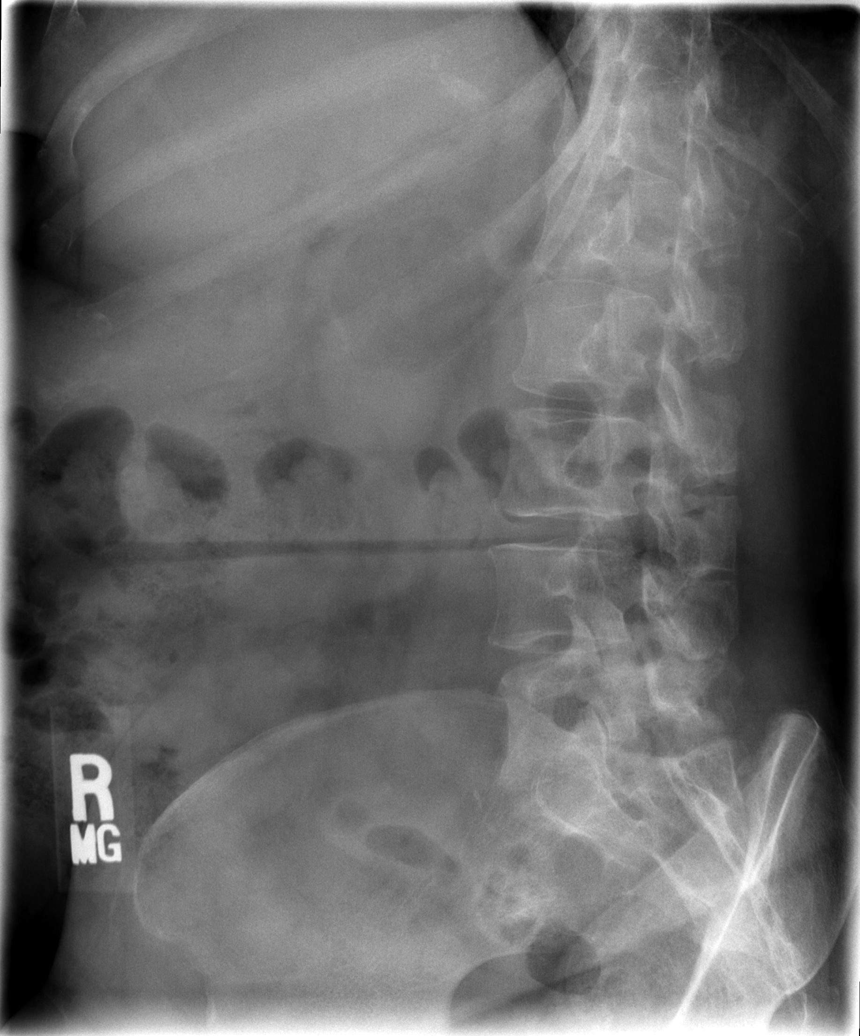

[t l-spine oblique exposure (2 of 2)]
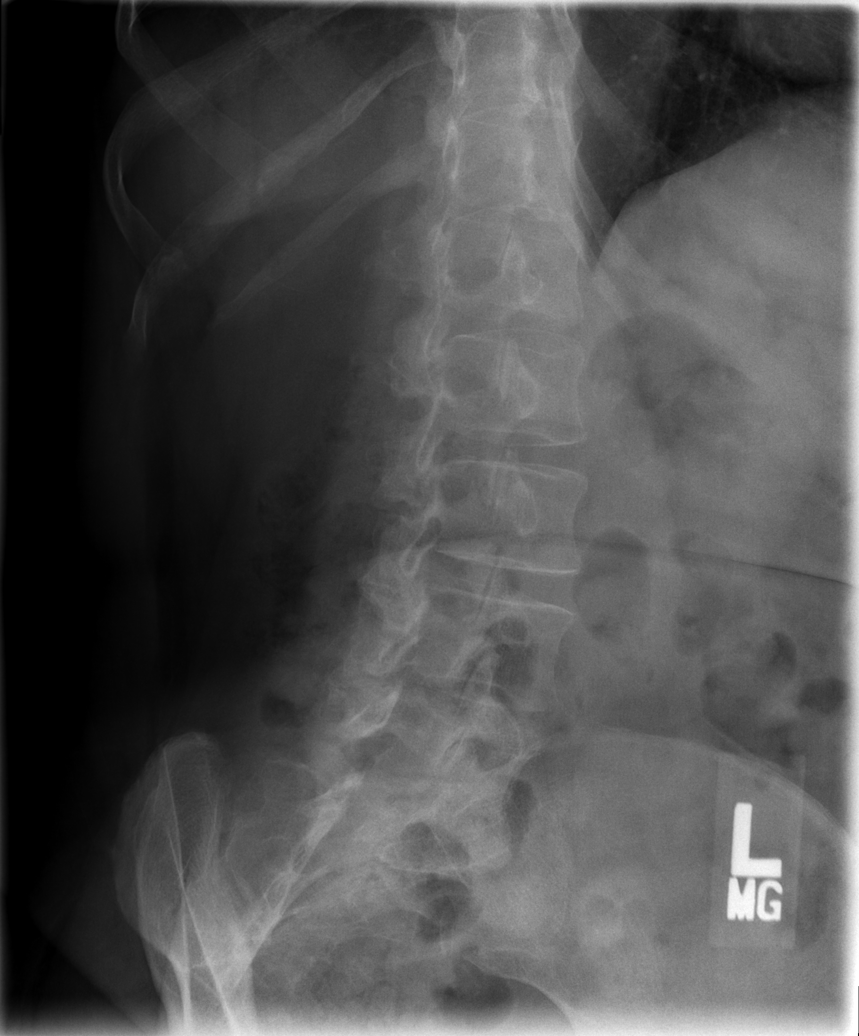

[t l-spine lat]
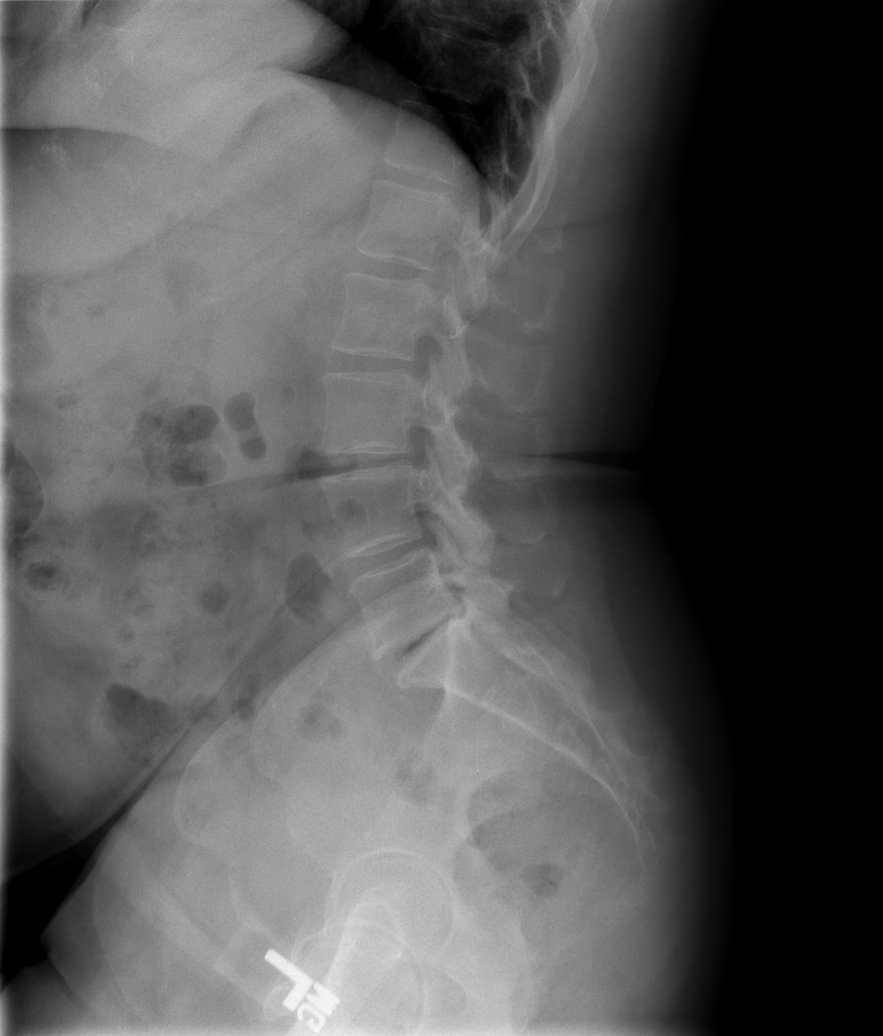

[t l-spine l5-s1 spot]
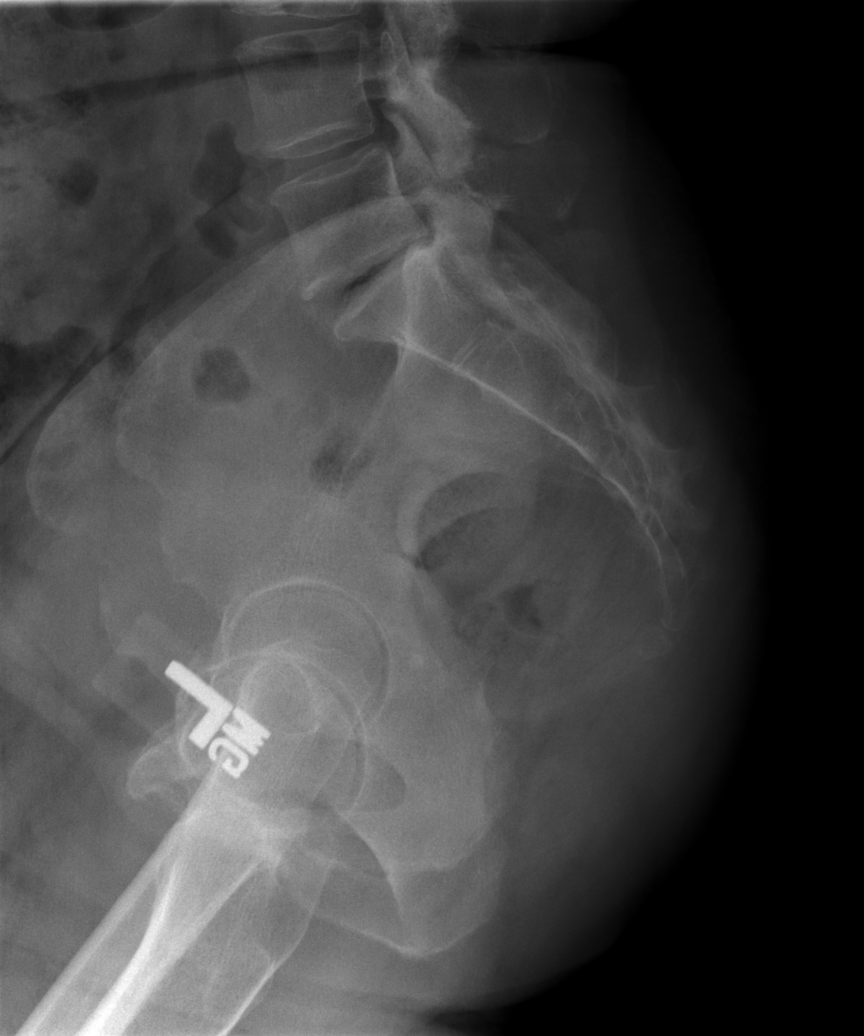

[5 of 5 positions shown; findings below may reference images not displayed]

FINDINGS: Normal lumbar segmentation.  Normal vertebral height and
alignment.  Chronic severe disc degeneration at L5 S1 with vacuum
disc phenomena, similar to 5655. Lumbar disc spaces elsewhere are
stable and relatively preserved.  Moderate L4-L5 and L5-S1 facet
hypertrophy.  No pars fracture.  Vacuum SI joint phenomenon on the
right.
IMPRESSION: 1. No acute osseous abnormality in the lumbar spine.
2.  Advanced chronic L5-S1 disc degeneration and lower lumbar facet
degeneration, grossly stable since [DATE].

## 2013-10-07 IMAGING — CR DG THORACIC SPINE 3V
3 series · 3 of 3 positions shown · non-contrast
Comparison: CTA chest 10/13/2008 and more recent chest radiographs.

CLINICAL DATA: 70-year-old female with pain radiating down the
spine.

THORACIC SPINE - 2 VIEW + SWIMMERS

[t t-spine a.p.]
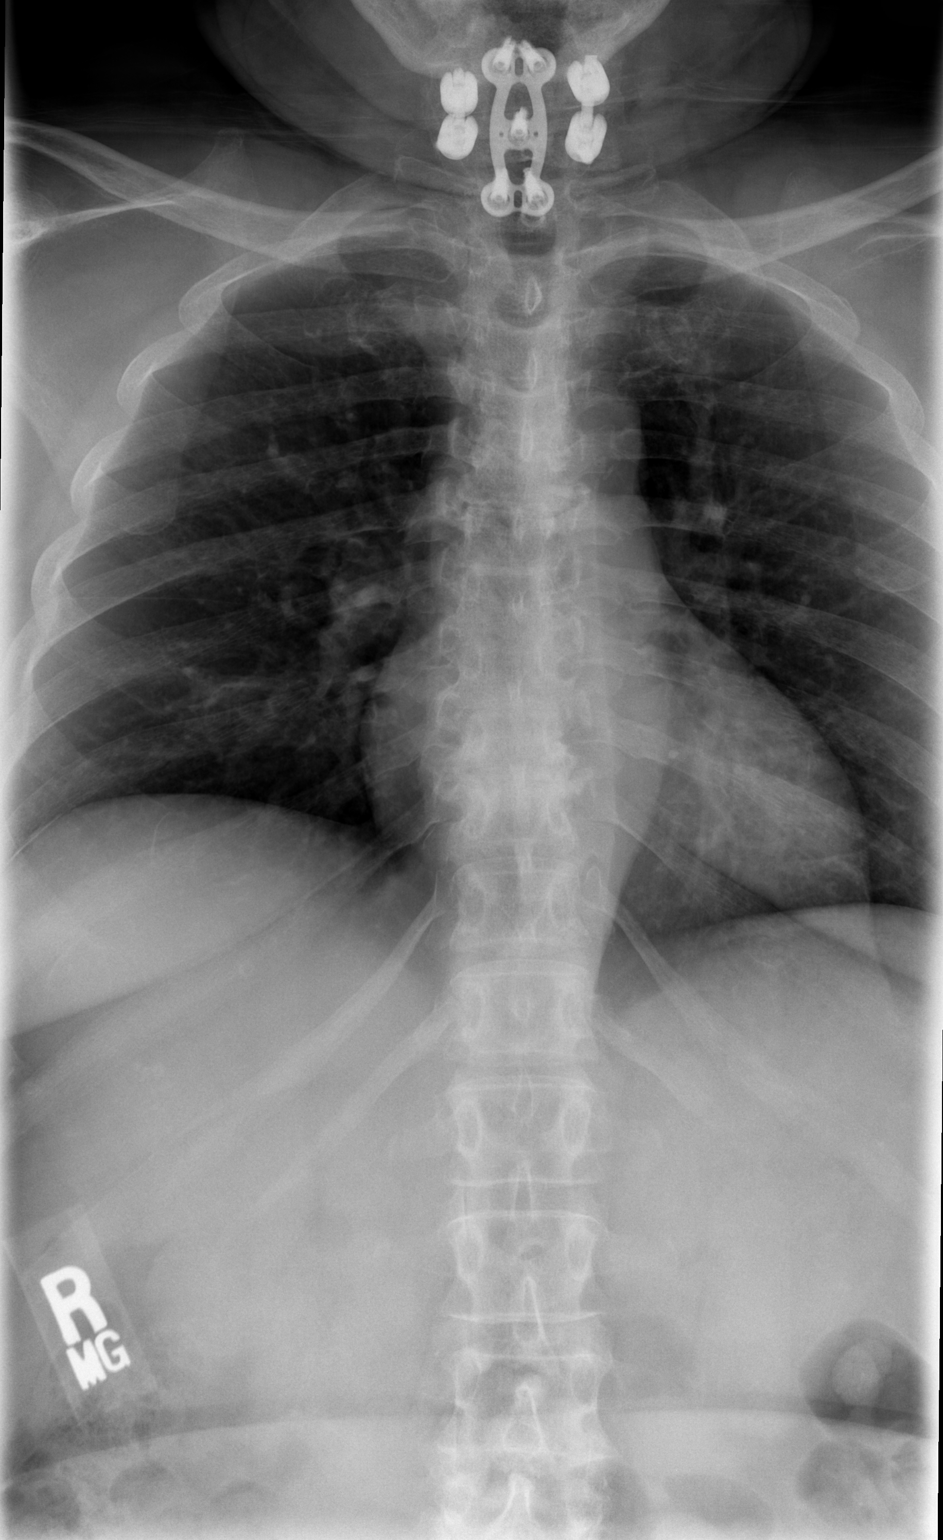

[t t-spine lat *]
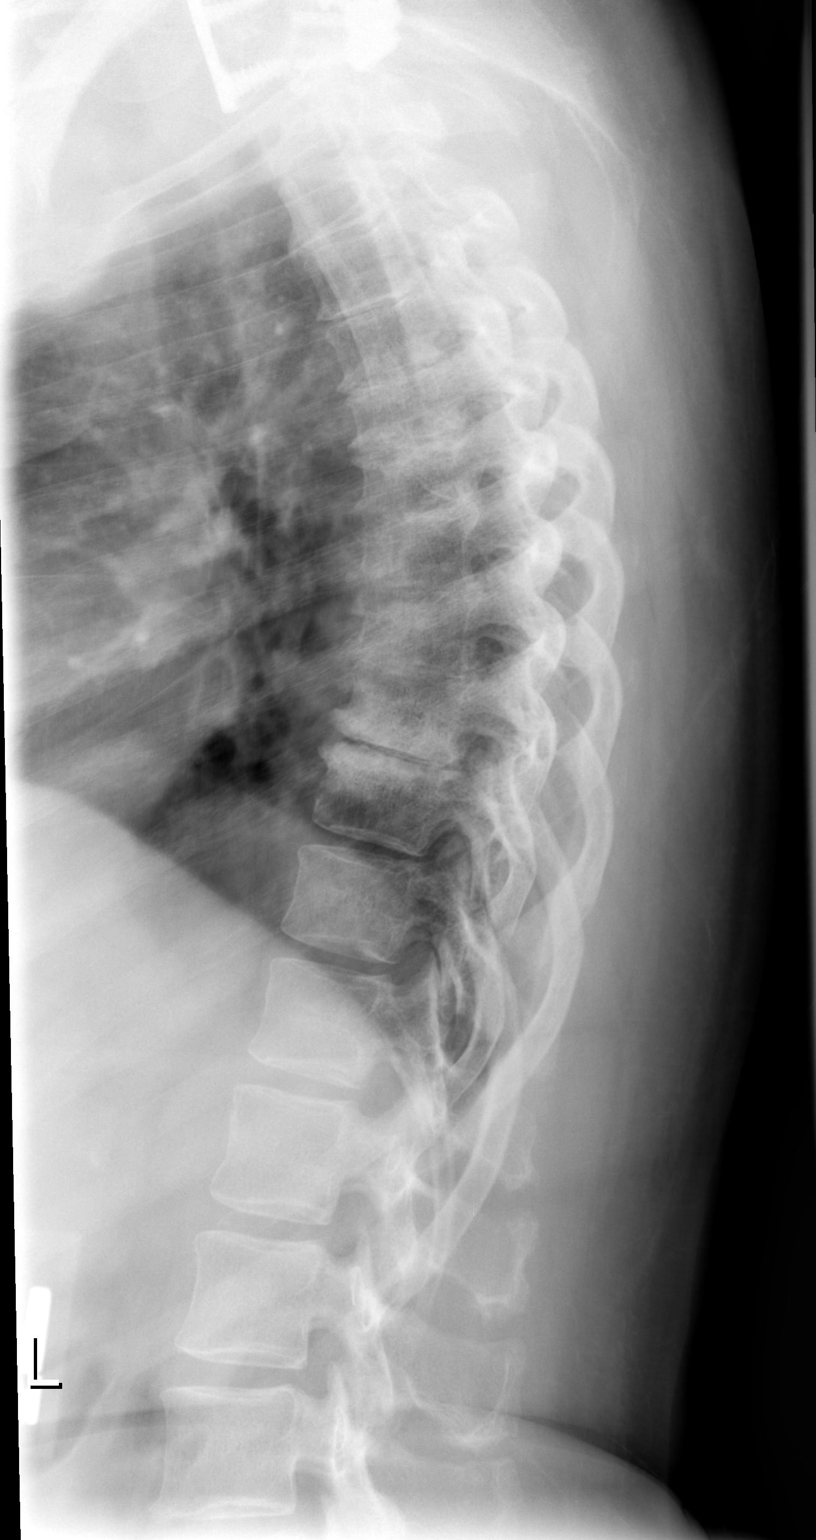

[t swimmers]
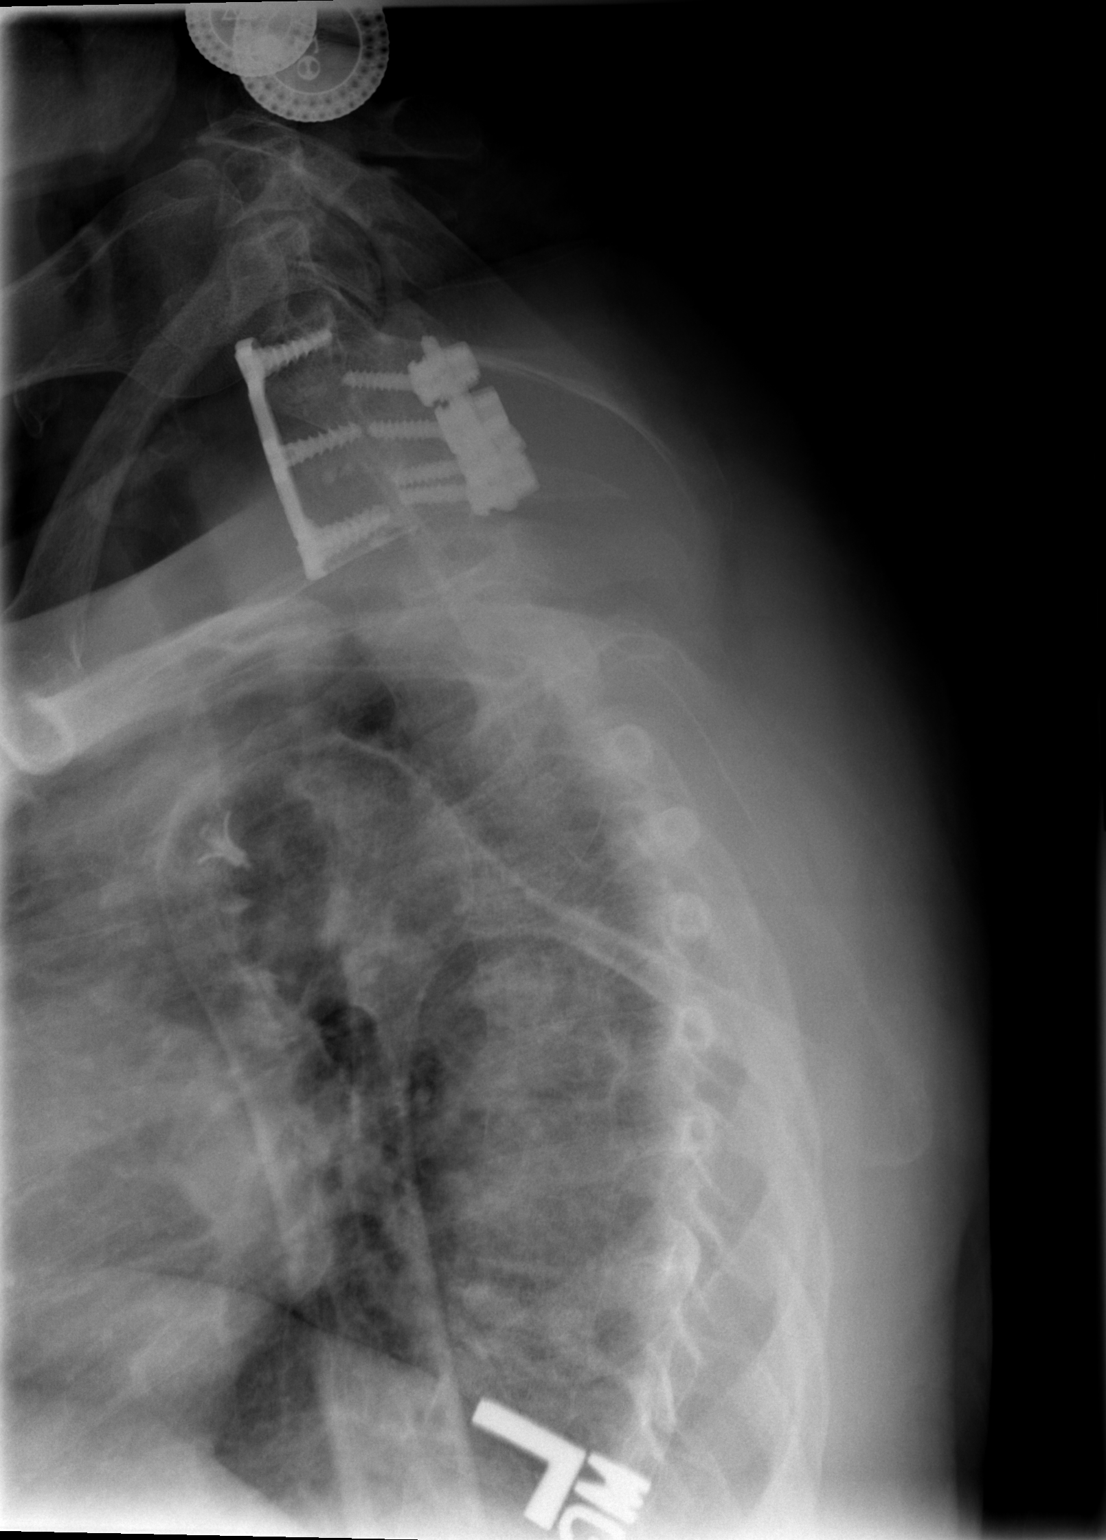

[3 of 3 positions shown; findings below may reference images not displayed]

FINDINGS: Cervical fusion changes extending to C7.  Normal thoracic
segmentation (mildly hypoplastic twelfth ribs).  Severe mid
thoracic disc space loss, including at T9-T10 where there is vacuum
phenomena as well as significant endplate sclerosis and bulky
spurring. Cervicothoracic junction alignment is within normal
limits.  Visualized cervical hardware grossly intact.  Stable
thoracic vertebral height and alignment since 7898.  Posterior ribs
appear intact.  The visualized thoracic visceral contours grossly
stable.
IMPRESSION: 1. No acute osseous abnormality in the thoracic spine.
2.  Advanced chronic mid thoracic disc degeneration, maximal at T9-
T10.

## 2013-10-13 ENCOUNTER — Ambulatory Visit (INDEPENDENT_AMBULATORY_CARE_PROVIDER_SITE_OTHER): Payer: Medicare Other

## 2013-10-13 ENCOUNTER — Encounter: Payer: Self-pay | Admitting: Podiatry

## 2013-10-13 ENCOUNTER — Ambulatory Visit (INDEPENDENT_AMBULATORY_CARE_PROVIDER_SITE_OTHER): Payer: Medicare Other | Admitting: Podiatry

## 2013-10-13 VITALS — BP 141/76 | HR 69 | Resp 12

## 2013-10-13 DIAGNOSIS — R52 Pain, unspecified: Secondary | ICD-10-CM

## 2013-10-13 DIAGNOSIS — E1049 Type 1 diabetes mellitus with other diabetic neurological complication: Secondary | ICD-10-CM

## 2013-10-13 DIAGNOSIS — M779 Enthesopathy, unspecified: Secondary | ICD-10-CM | POA: Diagnosis not present

## 2013-10-13 MED ORDER — GABAPENTIN 300 MG PO CAPS: 300.0000 mg | ORAL_CAPSULE | Freq: Every day | ORAL | Status: DC

## 2013-10-13 NOTE — Patient Instructions (Signed)
Take one 300 mg gabapentin at night for painful burning feet

## 2013-10-13 NOTE — Progress Notes (Signed)
   Subjective:    Patient ID: [name], female    DOB: 26-Dec-1940, 73 y.o.   MRN: [id number]  HPI N-SORE, BURNING SENSATION. L-B/L FEET D-6 MONTHS O-SLOWLY C-WORSE A-SITTING/STANDING T-DIABETIC PAIN RELIEF CREAM  The burning prevents patient from sleeping at night.  Review of Systems  All other systems reviewed and are negative.      Objective:   Physical Exam  Orientated x3 black female  Vascular: DP and PT pulses 2/4 bilaterally  Neurological: Ankle reflexes equal and reactive bilaterally Vibratory sensation reactive bilaterally Sensation to 10 g monofilament wire intact 5/5 bilaterally  Dermatological: No skin lesions noted Texture and turgor within normal limits bilaterally  Musculoskeletal: HAV deformity left Hammertoe deformity second bilaterally Palpable tenderness plantar second through fourth MPJ bilaterally without any palpable lesions bilaterally   X-ray examination right foot  Intact bony structure without fracture and/or dislocation noted  Slight narrowing right first MPJ  Hammertoe deformity second  Radiographic impression:  No acute bony abnormality noted in the right foot  X-ray examination weightbearing left foot  Intact bony structure without fracture and/or dislocation  Hammertoe deformity second  Radiographic impression:  No acute bony abnormality noted in left foot        Assessment & Plan:   Assessment: Metatarsalgia/capsulitis 2-4 MPJ bilaterally Diabetic peripheral neuropathy based on symptoms of burning and stinging  Plan: Patient advised to wear a thick sole athletic style shoe Gabapentin 300 mg #30 take one at bedtime 2 refills  Reappoint x30 days

## 2013-10-26 ENCOUNTER — Ambulatory Visit (INDEPENDENT_AMBULATORY_CARE_PROVIDER_SITE_OTHER): Payer: Medicare Other | Admitting: Ophthalmology

## 2013-10-26 DIAGNOSIS — E11319 Type 2 diabetes mellitus with unspecified diabetic retinopathy without macular edema: Secondary | ICD-10-CM

## 2013-10-26 DIAGNOSIS — E1039 Type 1 diabetes mellitus with other diabetic ophthalmic complication: Secondary | ICD-10-CM

## 2013-10-26 DIAGNOSIS — E1065 Type 1 diabetes mellitus with hyperglycemia: Secondary | ICD-10-CM

## 2013-10-26 DIAGNOSIS — H35039 Hypertensive retinopathy, unspecified eye: Secondary | ICD-10-CM | POA: Diagnosis not present

## 2013-10-26 DIAGNOSIS — I10 Essential (primary) hypertension: Secondary | ICD-10-CM

## 2013-10-26 DIAGNOSIS — H43819 Vitreous degeneration, unspecified eye: Secondary | ICD-10-CM

## 2013-11-03 DIAGNOSIS — N390 Urinary tract infection, site not specified: Secondary | ICD-10-CM | POA: Diagnosis not present

## 2013-11-03 DIAGNOSIS — R39198 Other difficulties with micturition: Secondary | ICD-10-CM | POA: Diagnosis not present

## 2013-11-03 DIAGNOSIS — Z0000 Encounter for general adult medical examination without abnormal findings: Secondary | ICD-10-CM | POA: Diagnosis not present

## 2013-11-03 DIAGNOSIS — E119 Type 2 diabetes mellitus without complications: Secondary | ICD-10-CM | POA: Diagnosis not present

## 2013-11-07 IMAGING — CR DG CHEST 2V
2 series · 2 of 2 positions shown · non-contrast
Comparison: Two-view chest x-ray 10/25/2010, 04/25/2010 [REDACTED] and 07/19/2010 [HOSPITAL].

CLINICAL DATA: Left-sided chest pain.  History of diabetes and
hypertension.

CHEST - 2 VIEW 12/27/2010:

[w chest lat]
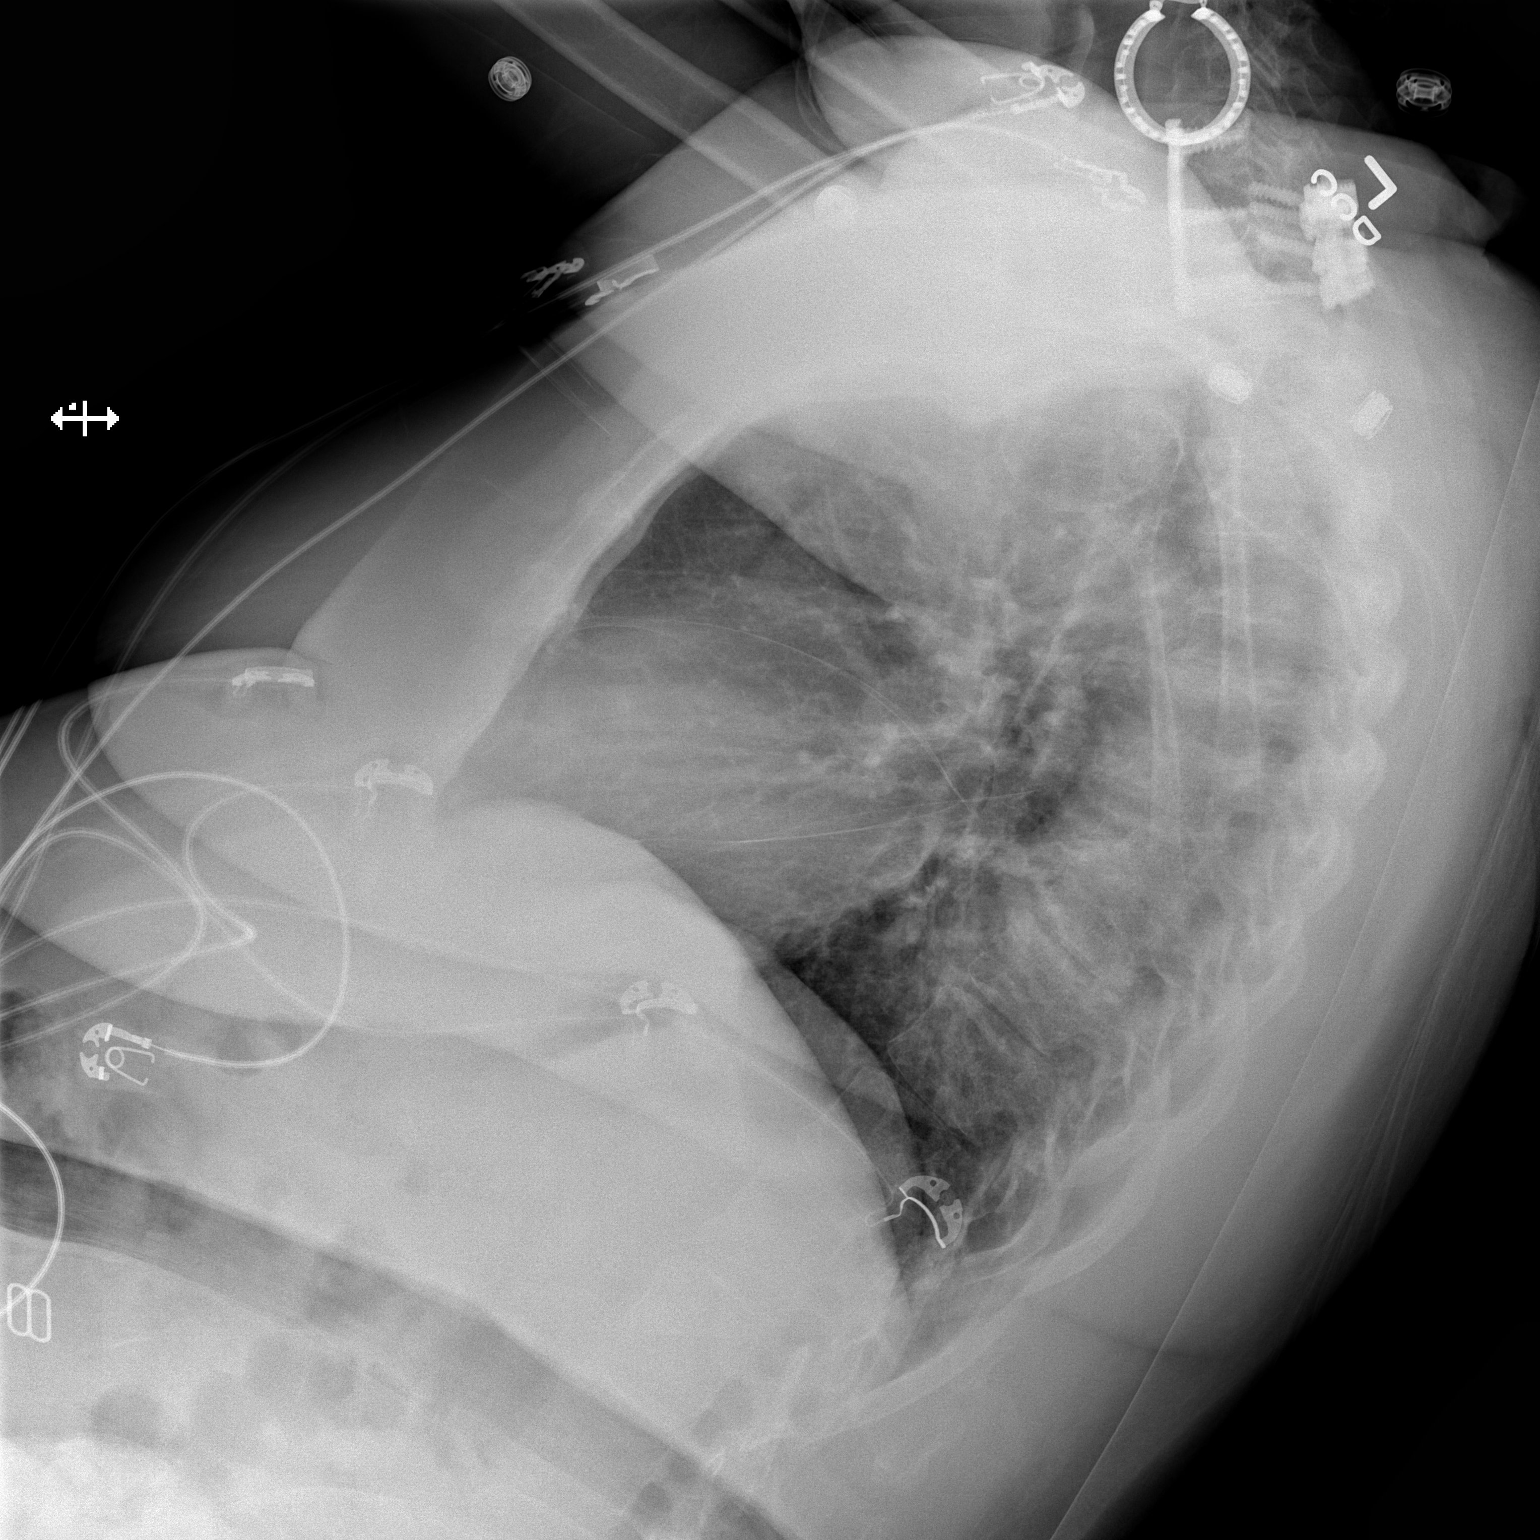

[x chest ap]
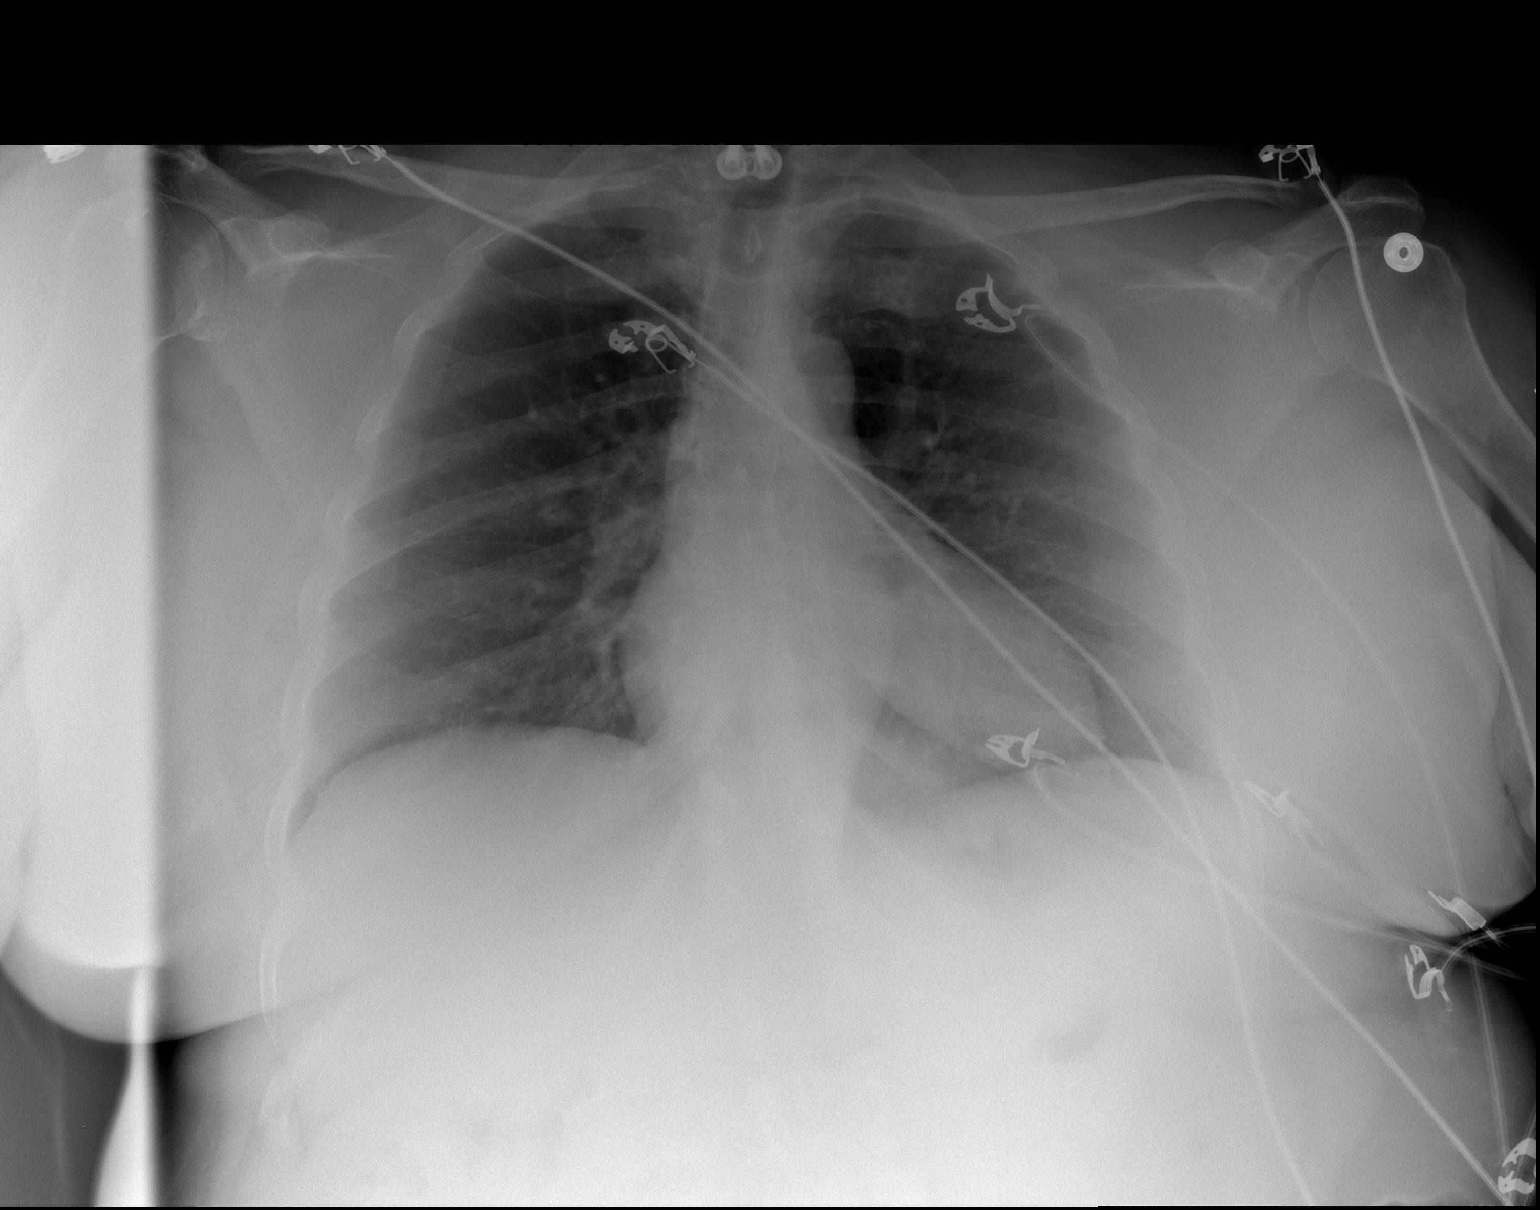

[2 of 2 positions shown; findings below may reference images not displayed]

FINDINGS: Cardiac silhouette normal in size for the AP technique,
unchanged.  Thoracic aorta mildly tortuous, unchanged.  Hilar and
mediastinal contours otherwise unremarkable.  Lungs clear.
Pulmonary vascularity normal.  Bronchovascular markings normal.  No
pleural effusions.  Degenerative changes involving the thoracic
spine.  No significant interval change.
IMPRESSION: No acute cardiopulmonary disease.  Stable examination.

## 2013-11-15 ENCOUNTER — Encounter: Payer: Self-pay | Admitting: Podiatry

## 2013-11-15 ENCOUNTER — Ambulatory Visit (INDEPENDENT_AMBULATORY_CARE_PROVIDER_SITE_OTHER): Payer: Medicare Other | Admitting: Podiatry

## 2013-11-15 VITALS — BP 124/64 | HR 70 | Resp 12

## 2013-11-15 DIAGNOSIS — E1049 Type 1 diabetes mellitus with other diabetic neurological complication: Secondary | ICD-10-CM | POA: Diagnosis not present

## 2013-11-15 DIAGNOSIS — M79673 Pain in unspecified foot: Secondary | ICD-10-CM

## 2013-11-15 DIAGNOSIS — M79609 Pain in unspecified limb: Secondary | ICD-10-CM

## 2013-11-15 NOTE — Patient Instructions (Signed)
Maintain 300 mg gabapentin at night

## 2013-11-16 ENCOUNTER — Encounter: Payer: Self-pay | Admitting: Podiatry

## 2013-11-16 NOTE — Progress Notes (Signed)
Patient ID: [name], female   DOB: 1940-08-31, 73 y.o.   MRN: [id number]  Subjective: This patient presents for the followup visit of 10/13/2013 describing dramatic relief from gabapentin 300 mg at bedtime. She is denying any complaint on the gabapentin including swelling and drowsiness.  Objective: No skin lesions noted No edema noted HAV deformity left Hammertoe deformity second bilaterally  Assessment: Significant reduction of painful diabetic neuropathy with good tolerance of gabapentin  Plan: Maintain gabapentin 300 mg at bedtime with refills x11 months  Reappoint x1 year or sooner if patient has concern

## 2013-12-06 ENCOUNTER — Other Ambulatory Visit: Payer: Self-pay

## 2013-12-06 DIAGNOSIS — N63 Unspecified lump in unspecified breast: Secondary | ICD-10-CM

## 2013-12-10 ENCOUNTER — Other Ambulatory Visit: Payer: Self-pay | Admitting: Family Medicine

## 2013-12-10 DIAGNOSIS — N63 Unspecified lump in unspecified breast: Secondary | ICD-10-CM

## 2013-12-27 ENCOUNTER — Other Ambulatory Visit: Payer: Self-pay | Admitting: Family Medicine

## 2013-12-27 DIAGNOSIS — Z1231 Encounter for screening mammogram for malignant neoplasm of breast: Secondary | ICD-10-CM

## 2014-01-03 ENCOUNTER — Encounter (HOSPITAL_COMMUNITY): Payer: Self-pay

## 2014-01-03 ENCOUNTER — Emergency Department (INDEPENDENT_AMBULATORY_CARE_PROVIDER_SITE_OTHER)
Admission: EM | Admit: 2014-01-03 | Discharge: 2014-01-03 | Disposition: A | Discharge status: Home / Self Care | Payer: Medicare Other | Source: Home / Self Care | Attending: Emergency Medicine | Admitting: Emergency Medicine

## 2014-01-03 DIAGNOSIS — J019 Acute sinusitis, unspecified: Secondary | ICD-10-CM | POA: Diagnosis not present

## 2014-01-03 DIAGNOSIS — J209 Acute bronchitis, unspecified: Secondary | ICD-10-CM

## 2014-01-03 MED ORDER — CEFDINIR 300 MG PO CAPS: 300.0000 mg | ORAL_CAPSULE | Freq: Two times a day (BID) | ORAL | Status: DC

## 2014-01-03 MED ORDER — HYDROCOD POLST-CHLORPHEN POLST 10-8 MG/5ML PO LQCR: 5.0000 mL | Freq: Two times a day (BID) | ORAL | Status: DC | PRN

## 2014-01-03 NOTE — Discharge Instructions (Signed)
Acute Bronchitis Bronchitis is inflammation of the airways that extend from the windpipe into the lungs (bronchi). The inflammation often causes mucus to develop. This leads to a cough, which is the most common symptom of bronchitis.  In acute bronchitis, the condition usually develops suddenly and goes away over time, usually in a couple weeks. Smoking, allergies, and asthma can make bronchitis worse. Repeated episodes of bronchitis may cause further lung problems.  CAUSES Acute bronchitis is most often caused by the same virus that causes a cold. The virus can spread from person to person (contagious) through coughing, sneezing, and touching contaminated objects. SIGNS AND SYMPTOMS   Cough.   Fever.   Coughing up mucus.   Body aches.   Chest congestion.   Chills.   Shortness of breath.   Sore throat.  DIAGNOSIS  Acute bronchitis is usually diagnosed through a physical exam. Your health care provider will also ask you questions about your medical history. Tests, such as chest X-rays, are sometimes done to rule out other conditions.  TREATMENT  Acute bronchitis usually goes away in a couple weeks. Oftentimes, no medical treatment is necessary. Medicines are sometimes given for relief of fever or cough. Antibiotic medicines are usually not needed but may be prescribed in certain situations. In some cases, an inhaler may be recommended to help reduce shortness of breath and control the cough. A cool mist vaporizer may also be used to help thin bronchial secretions and make it easier to clear the chest.  HOME CARE INSTRUCTIONS  Get plenty of rest.   Drink enough fluids to keep your urine clear or pale yellow (unless you have a medical condition that requires fluid restriction). Increasing fluids may help thin your respiratory secretions (sputum) and reduce chest congestion, and it will prevent dehydration.   Take medicines only as directed by your health care provider.  If  you were prescribed an antibiotic medicine, finish it all even if you start to feel better.  Avoid smoking and secondhand smoke. Exposure to cigarette smoke or irritating chemicals will make bronchitis worse. If you are a smoker, consider using nicotine gum or skin patches to help control withdrawal symptoms. Quitting smoking will help your lungs heal faster.   Reduce the chances of another bout of acute bronchitis by washing your hands frequently, avoiding people with cold symptoms, and trying not to touch your hands to your mouth, nose, or eyes.   Keep all follow-up visits as directed by your health care provider.  SEEK MEDICAL CARE IF: Your symptoms do not improve after 1 week of treatment.  SEEK IMMEDIATE MEDICAL CARE IF:  You develop an increased fever or chills.   You have chest pain.   You have severe shortness of breath.  You have bloody sputum.   You develop dehydration.  You faint or repeatedly feel like you are going to pass out.  You develop repeated vomiting.  You develop a severe headache. MAKE SURE YOU:   Understand these instructions.  Will watch your condition.  Will get help right away if you are not doing well or get worse. Document Released: 03/14/2004 Document Revised: 06/21/2013 Document Reviewed: 07/28/2012 Exit[employer] Patient Information 2015 [employer]. This information is not intended to replace advice given to you by your health care provider. Make sure you discuss any questions you have with your health care provider. Sinusitis Sinusitis is redness, soreness, and inflammation of the paranasal sinuses. Paranasal sinuses are air pockets within the bones of your face (beneath the   eyes, the middle of the forehead, or above the eyes). In healthy paranasal sinuses, mucus is able to drain out, and air is able to circulate through them by way of your nose. However, when your paranasal sinuses are inflamed, mucus and air can become trapped. This can  allow bacteria and other germs to grow and cause infection. Sinusitis can develop quickly and last only a short time (acute) or continue over a long period (chronic). Sinusitis that lasts for more than 12 weeks is considered chronic.  CAUSES  Causes of sinusitis include:  Allergies.  Structural abnormalities, such as displacement of the cartilage that separates your nostrils (deviated septum), which can decrease the air flow through your nose and sinuses and affect sinus drainage.  Functional abnormalities, such as when the small hairs (cilia) that line your sinuses and help remove mucus do not work properly or are not present. SIGNS AND SYMPTOMS  Symptoms of acute and chronic sinusitis are the same. The primary symptoms are pain and pressure around the affected sinuses. Other symptoms include:  Upper toothache.  Earache.  Headache.  Bad breath.  Decreased sense of smell and taste.  A cough, which worsens when you are lying flat.  Fatigue.  Fever.  Thick drainage from your nose, which often is green and may contain pus (purulent).  Swelling and warmth over the affected sinuses. DIAGNOSIS  Your health care provider will perform a physical exam. During the exam, your health care provider may:  Look in your nose for signs of abnormal growths in your nostrils (nasal polyps).  Tap over the affected sinus to check for signs of infection.  View the inside of your sinuses (endoscopy) using an imaging device that has a light attached (endoscope). If your health care provider suspects that you have chronic sinusitis, one or more of the following tests may be recommended:  Allergy tests.  Nasal culture. A sample of mucus is taken from your nose, sent to a lab, and screened for bacteria.  Nasal cytology. A sample of mucus is taken from your nose and examined by your health care provider to determine if your sinusitis is related to an allergy. TREATMENT  Most cases of acute  sinusitis are related to a viral infection and will resolve on their own within 10 days. Sometimes medicines are prescribed to help relieve symptoms (pain medicine, decongestants, nasal steroid sprays, or saline sprays).  However, for sinusitis related to a bacterial infection, your health care provider will prescribe antibiotic medicines. These are medicines that will help kill the bacteria causing the infection.  Rarely, sinusitis is caused by a fungal infection. In theses cases, your health care provider will prescribe antifungal medicine. For some cases of chronic sinusitis, surgery is needed. Generally, these are cases in which sinusitis recurs more than 3 times per year, despite other treatments. HOME CARE INSTRUCTIONS   Drink plenty of water. Water helps thin the mucus so your sinuses can drain more easily.  Use a humidifier.  Inhale steam 3 to 4 times a day (for example, sit in the bathroom with the shower running).  Apply a warm, moist washcloth to your face 3 to 4 times a day, or as directed by your health care provider.  Use saline nasal sprays to help moisten and clean your sinuses.  Take medicines only as directed by your health care provider.  If you were prescribed either an antibiotic or antifungal medicine, finish it all even if you start to feel better. SEEK IMMEDIATE MEDICAL   CARE IF:  You have increasing pain or severe headaches.  You have nausea, vomiting, or drowsiness.  You have swelling around your face.  You have vision problems.  You have a stiff neck.  You have difficulty breathing. MAKE SURE YOU:   Understand these instructions.  Will watch your condition.  Will get help right away if you are not doing well or get worse. Document Released: 02/04/2005 Document Revised: 06/21/2013 Document Reviewed: 02/19/2011 Exit[employer] Patient Information 2015 [employer]. This information is not intended to replace advice given to you by your health care provider.  Make sure you discuss any questions you have with your health care provider.  

## 2014-01-03 NOTE — ED Provider Notes (Signed)
  Chief Complaint   Cough   History of Present Illness   [name] is a 73 year old female with diabetes and hypertension who presents with two-week history of cough productive yellow sputum, wheezing, chills, nasal congestion, rhinorrhea, sore throat, and nausea. She denies any chest pain, headache, sinus pressure, earache, vomiting, or diarrhea.  Review of Systems   Other than as noted above, the patient denies any of the following symptoms: Systemic:  No fevers, chills, sweats, or myalgias. Eye:  No redness or discharge. ENT:  No ear pain, headache, nasal congestion, drainage, sinus pressure, or sore throat. Neck:  No neck pain, stiffness, or swollen glands. Lungs:  No cough, sputum production, hemoptysis, wheezing, chest tightness, shortness of breath or chest pain. GI:  No abdominal pain, nausea, vomiting or diarrhea.  Yell   Past medical history, family history, social history, meds, and allergies were reviewed. She is allergic to sulfa. She has diabetes, hypertension, and history of kidney cancer. Current meds include albuterol, Norvasc, aspirin, Lipitor, Nexium, Neurontin, Novolin 70/30, lisinopril, metoprolol, and nitroglycerin.  Physical exam   Vital signs:  BP 158/67 mmHg  Pulse 70  Temp(Src) 98.2 F (36.8 C) (Oral)  Resp 18  SpO2 99% General:  Alert and oriented.  In no distress.  Skin warm and dry. Eye:  No conjunctival injection or drainage. Lids were normal. ENT:  TMs and canals were normal, without erythema or inflammation.  Nasal mucosa was clear and uncongested, without drainage.  Mucous membranes were moist.  Pharynx was clear with no exudate or drainage.  There were no oral ulcerations or lesions. Neck:  Supple, no adenopathy, tenderness or mass. Lungs:  No respiratory distress.  Lungs were clear to auscultation, without wheezes, rales or rhonchi.  Breath sounds were clear and equal bilaterally.  Heart:  Regular rhythm, without gallops, murmers or  rubs. Skin:  Clear, warm, and dry, without rash or lesions.   Assessment     The primary encounter diagnosis was Acute sinusitis, recurrence not specified, unspecified location. A diagnosis of Acute bronchitis, unspecified organism was also pertinent to this visit.  There is no evidence of pneumonia, strep throat, or otitis media.    Plan    1.  Meds:  The following meds were prescribed:   Discharge Medication List as of 01/03/2014  4:06 PM    START taking these medications   Details  cefdinir (OMNICEF) 300 MG capsule Take 1 capsule (300 mg total) by mouth 2 (two) times daily., Starting 01/03/2014, Until Discontinued, Normal    chlorpheniramine-HYDROcodone (TUSSIONEX) 10-8 MG/5ML LQCR Take 5 mLs by mouth every 12 (twelve) hours as needed for cough., Starting 01/03/2014, Until Discontinued, Normal        2.  Patient Education/Counseling:  The patient was given appropriate handouts, self care instructions, and instructed in symptomatic relief.  Instructed to get extra fluids and extra rest.    3.  Follow up:  The patient was told to follow up here if no better in 3 to 4 days, or sooner if becoming worse in any way, and given some red flag symptoms such as increasing fever, difficulty breathing, chest pain, or persistent vomiting which would prompt immediate return.       Harden Mo, MD 01/03/14 2127

## 2014-01-03 NOTE — ED Notes (Signed)
C/o 2 week duration of cough

## 2014-01-07 ENCOUNTER — Ambulatory Visit: Payer: Medicare Other

## 2014-01-20 ENCOUNTER — Ambulatory Visit
Admission: RE | Admit: 2014-01-20 | Discharge: 2014-01-20 | Disposition: A | Discharge status: Home / Self Care | Payer: Medicare Other | Source: Ambulatory Visit | Attending: Family Medicine | Admitting: Family Medicine

## 2014-01-20 DIAGNOSIS — Z1231 Encounter for screening mammogram for malignant neoplasm of breast: Secondary | ICD-10-CM

## 2014-01-26 ENCOUNTER — Other Ambulatory Visit (HOSPITAL_COMMUNITY): Payer: Self-pay | Admitting: Urology

## 2014-01-26 ENCOUNTER — Ambulatory Visit (HOSPITAL_COMMUNITY)
Admission: RE | Admit: 2014-01-26 | Discharge: 2014-01-26 | Disposition: A | Discharge status: Home / Self Care | Payer: Medicare Other | Source: Ambulatory Visit | Attending: Urology | Admitting: Urology

## 2014-01-26 DIAGNOSIS — C649 Malignant neoplasm of unspecified kidney, except renal pelvis: Secondary | ICD-10-CM

## 2014-01-26 DIAGNOSIS — N3946 Mixed incontinence: Secondary | ICD-10-CM | POA: Diagnosis not present

## 2014-02-01 DIAGNOSIS — Z23 Encounter for immunization: Secondary | ICD-10-CM | POA: Diagnosis not present

## 2014-02-01 DIAGNOSIS — E119 Type 2 diabetes mellitus without complications: Secondary | ICD-10-CM | POA: Diagnosis not present

## 2014-02-01 DIAGNOSIS — I10 Essential (primary) hypertension: Secondary | ICD-10-CM | POA: Diagnosis not present

## 2014-02-02 ENCOUNTER — Other Ambulatory Visit: Payer: Self-pay | Admitting: Podiatry

## 2014-04-04 ENCOUNTER — Ambulatory Visit: Payer: Medicare Other | Admitting: Podiatry

## 2014-04-11 ENCOUNTER — Ambulatory Visit: Payer: Medicare Other | Admitting: Podiatry

## 2014-04-22 ENCOUNTER — Encounter: Payer: Self-pay | Admitting: Dietician

## 2014-04-22 ENCOUNTER — Encounter: Payer: Medicare Other | Attending: Family Medicine | Admitting: Dietician

## 2014-04-22 DIAGNOSIS — N189 Chronic kidney disease, unspecified: Secondary | ICD-10-CM | POA: Diagnosis not present

## 2014-04-22 DIAGNOSIS — Z794 Long term (current) use of insulin: Secondary | ICD-10-CM | POA: Insufficient documentation

## 2014-04-22 DIAGNOSIS — E1122 Type 2 diabetes mellitus with diabetic chronic kidney disease: Secondary | ICD-10-CM | POA: Insufficient documentation

## 2014-04-22 DIAGNOSIS — Z713 Dietary counseling and surveillance: Secondary | ICD-10-CM | POA: Diagnosis not present

## 2014-04-22 NOTE — Patient Instructions (Addendum)
Purchase some juice boxes (any juice flavor will work) to keep on your nightstand and in your purse to treat low blood sugars (blood sugar reading <70). Choose 1-2 servings of carbohydrate off of your snack sheet and always pair this with a protein food on your snack sheet.  This will help balance your blood sugar response. Aim to drink water more often and limit sugary beverages. Limit fried foods to 1-2 times a week. If your blood sugar is high, drink water and walk on the treadmill for 5-10 minutes.

## 2014-04-22 NOTE — Progress Notes (Signed)
Diabetes Self-Management Education  Visit Type:  initial  Appt. Start Time: 0915 Appt. End Time: [id number]  04/22/2014  [name], identified by name and date of birth, is a 74 y.o. female with a diagnosis of Diabetes: Type 2 (diagnosed following her second pregnancy, she had GDM for 1st and 2nd pregnancies).  Other people present during visit:  Patient   ASSESSMENT  Patient is taking insulin and reports she has some "bad eating habits."  She also reports reduced appetite over the past year and states she does not eat large portions.  Patient is very pleasant and easy to talk to but presents with a good degree of resistance to change.  She went through a period of denial when she was first diagnosed but really wants to limit the low blood sugar occurences she is having which "really scare her."  Initial Visit Information:  Are you currently following a meal plan?: No   Are you taking your medications as prescribed?: Yes          Psychosocial:     Patient Belief/Attitude about Diabetes: Defeat/Burnout Other persons present: Patient Patient Concerns: Nutrition/Meal planning Learning Readiness: Contemplating  Complications:   Last HgB A1C per patient/outside source: 8.1% How often do you check your blood sugar?: > 4 times/day Fasting Blood glucose range (mg/dL): >200 Postprandial Blood glucose range (mg/dL): >200 Number of hypoglycemic episodes per month: 3 (normally in the middle of the night) Can you tell when your blood sugar is low?: Yes What do you do if your blood sugar is low?: will eat cake or cookie or candy Number of hyperglycemic episodes per week:  (BG consistently runs in 200s )  Diet Intake:  Breakfast: 10:30 am - [place] bacon, egg, toast, tea with splenda, applesauce Snack (morning): none usually Lunch: 2 pm - [place] or ham sandwich, frnech fries fried at home, cookies or cake, water Snack (afternoon): none usually Dinner: 5-6 pm if home. 7-9pm if out to  eat with friends- lamb chops at home or fried or baked chicken when out to eat, a vegetable and a starch (creamed potatoes or sweet potatoes) Snack (evening): 10 pm - cake sometimes, usually pack of nab peanut butter crackers Beverage(s): water, tea with splenda, [place] (sweet tea with lemonade)  Exercise:  Exercise: ADL's (walks stairs at home)  Individualized Plan for Diabetes Self-Management Training:   Learning Objective:  Patient will have a greater understanding of diabetes self-management.   Education Topics Reviewed with Patient Today:    Role of diet in the treatment of diabetes and the relationship between the three main macronutrients and blood glucose level Meal timing in regards to the patients' current diabetes medication. Information on hints to eating out and maintain blood glucose control. Healthier snack options Role of exercise on diabetes management, blood pressure control and cardiac health. Reviewed patients medication for diabetes, action, purpose, timing of dose and side effects.   Taught treatment of hypoglycemia - the 15 rule., Discussed and identified patients' treatment of hyperglycemia. Relationship between chronic complications and blood glucose control (Reviewed recommended diabetes care schedule) Identified and addressed patients feelings and concerns about diabetes      PATIENTS GOALS/Plan (Developed by the patient):  Nutrition: General guidelines for healthy choices and portions discussed Medications: take my medication as prescribed Reducing Risk: treat hypoglycemia with 15 grams of carbs if blood glucose less than 70mg /dL  Plan:   Patient Instructions  Purchase some juice boxes (any juice flavor will work) to keep on  your nightstand and in your purse to treat low blood sugars (blood sugar reading <70). Choose 1-2 servings of carbohydrate off of your snack sheet and always pair this with a protein food on your snack sheet.  This will  help balance your blood sugar response. Aim to drink water more often and limit sugary beverages. Limit fried foods to 1-2 times a week. If your blood sugar is high, drink water and walk on the treadmill for 5-10 minutes.     Expected Outcomes:  Other (comment) (Patient demonstrated interest in learning but low degree of motivation to change.  Expect limited changes.)  Education material provided: Living Well with Diabetes, My Plate, Snack sheet, Diabetes Care Schedule  If problems or questions, patient to contact team via:  Phone and Email  Future DSME appointment: PRN

## 2014-05-02 DIAGNOSIS — I10 Essential (primary) hypertension: Secondary | ICD-10-CM | POA: Diagnosis not present

## 2014-05-02 DIAGNOSIS — E1122 Type 2 diabetes mellitus with diabetic chronic kidney disease: Secondary | ICD-10-CM | POA: Diagnosis not present

## 2014-05-02 DIAGNOSIS — R079 Chest pain, unspecified: Secondary | ICD-10-CM | POA: Diagnosis not present

## 2014-05-02 DIAGNOSIS — H9319 Tinnitus, unspecified ear: Secondary | ICD-10-CM | POA: Diagnosis not present

## 2014-05-12 ENCOUNTER — Ambulatory Visit (INDEPENDENT_AMBULATORY_CARE_PROVIDER_SITE_OTHER): Payer: Medicare Other | Admitting: Ophthalmology

## 2014-05-12 DIAGNOSIS — I10 Essential (primary) hypertension: Secondary | ICD-10-CM

## 2014-05-12 DIAGNOSIS — H35033 Hypertensive retinopathy, bilateral: Secondary | ICD-10-CM | POA: Diagnosis not present

## 2014-05-12 DIAGNOSIS — E11339 Type 2 diabetes mellitus with moderate nonproliferative diabetic retinopathy without macular edema: Secondary | ICD-10-CM | POA: Diagnosis not present

## 2014-05-12 DIAGNOSIS — E11319 Type 2 diabetes mellitus with unspecified diabetic retinopathy without macular edema: Secondary | ICD-10-CM | POA: Diagnosis not present

## 2014-05-12 DIAGNOSIS — H43813 Vitreous degeneration, bilateral: Secondary | ICD-10-CM | POA: Diagnosis not present

## 2014-06-29 ENCOUNTER — Ambulatory Visit: Payer: Medicare Other | Admitting: Podiatry

## 2014-06-30 DIAGNOSIS — E780 Pure hypercholesterolemia: Secondary | ICD-10-CM | POA: Diagnosis not present

## 2014-06-30 DIAGNOSIS — E1165 Type 2 diabetes mellitus with hyperglycemia: Secondary | ICD-10-CM | POA: Diagnosis not present

## 2014-06-30 DIAGNOSIS — R079 Chest pain, unspecified: Secondary | ICD-10-CM | POA: Diagnosis not present

## 2014-06-30 DIAGNOSIS — G4733 Obstructive sleep apnea (adult) (pediatric): Secondary | ICD-10-CM | POA: Diagnosis not present

## 2014-07-08 DIAGNOSIS — R0789 Other chest pain: Secondary | ICD-10-CM | POA: Diagnosis not present

## 2014-07-11 ENCOUNTER — Ambulatory Visit (INDEPENDENT_AMBULATORY_CARE_PROVIDER_SITE_OTHER): Payer: Medicare Other | Admitting: Podiatry

## 2014-07-11 ENCOUNTER — Encounter: Payer: Self-pay | Admitting: Podiatry

## 2014-07-11 VITALS — BP 153/76 | HR 72 | Resp 16

## 2014-07-11 DIAGNOSIS — E1142 Type 2 diabetes mellitus with diabetic polyneuropathy: Secondary | ICD-10-CM

## 2014-07-11 DIAGNOSIS — G629 Polyneuropathy, unspecified: Secondary | ICD-10-CM

## 2014-07-11 DIAGNOSIS — E1342 Other specified diabetes mellitus with diabetic polyneuropathy: Secondary | ICD-10-CM | POA: Diagnosis not present

## 2014-07-11 DIAGNOSIS — M79673 Pain in unspecified foot: Secondary | ICD-10-CM | POA: Diagnosis not present

## 2014-07-11 NOTE — Progress Notes (Signed)
   Subjective:    Patient ID: [name], female    DOB: [birth year], 74 y.o.   MRN: [id number]  HPI   N: Sharp and Ache L: B/L R>L foot, ankles and up the leg D: For years (Since last visit) O: Gradual C: Tingle A: Pressure T: Medication proscribed but doesn't help  Patient presents with a similar complaint that was last evaluated on 11/15/2013. At that time gabapentin 300 mg was taken at bedtime and patient noticed a dramatic relief of symptoms. Today she now states that she is currently taking the gabapentin 300 mg at bedtime planing of a rather diffuse tingling and pressure sensation bilaterally   Review of Systems  Eyes: Positive for pain and itching.  Respiratory: Positive for chest tightness and shortness of breath.   Cardiovascular: Positive for chest pain.  Neurological: Positive for dizziness and headaches.   Patient denies any history of ulceration or claudication    Objective:   Physical Exam  Orientated 3  Vascular: DP pulses 2/4 bilaterally PT pulses 2/4 bilaterally No peripheral edema noted bilaterally No calf pain bilaterally  Neurological: Sensation to 10 g monofilament wire tie 5/5 bilaterally Vibratory sensation intact bilaterally Ankle reflexes reactive bilaterally  Dermatological: No open skin lesions noted bilaterally  Musculoskeletal: HAV deformities bilaterally Hammertoe second bilaterally       Assessment & Plan:   Assessment: Symptoms seem to be most consistent with painful diabetic peripheral neuropathy  Plan: I ReviewED the results with patient examination today Using the patient's existing prescription for gabapentin 300 mg with the following modifications: Increase gabapentin 300 MG take one twice a day 7 days if no relief in 7 days then Increase gabapentin 300 mg take one 3 times a day  Reevaluate 30 days

## 2014-07-11 NOTE — Patient Instructions (Signed)
Using your existing 300 mg gabapentin Take one 300 mg gabapentin twice a day 7 days If the pain earning numbness does not improve after taking gabapentin twice a day then Take one 300 mg gabapentin 3 times a day  Reevaluate 30 days   Please wear soft comfortable low heel shoes at all times Avoid women's dress shoes

## 2014-07-14 DIAGNOSIS — R0602 Shortness of breath: Secondary | ICD-10-CM | POA: Diagnosis not present

## 2014-07-25 DIAGNOSIS — R0602 Shortness of breath: Secondary | ICD-10-CM | POA: Diagnosis not present

## 2014-07-25 DIAGNOSIS — I10 Essential (primary) hypertension: Secondary | ICD-10-CM | POA: Diagnosis not present

## 2014-07-25 DIAGNOSIS — I209 Angina pectoris, unspecified: Secondary | ICD-10-CM | POA: Diagnosis not present

## 2014-07-25 DIAGNOSIS — E1165 Type 2 diabetes mellitus with hyperglycemia: Secondary | ICD-10-CM | POA: Diagnosis not present

## 2014-07-26 DIAGNOSIS — I209 Angina pectoris, unspecified: Secondary | ICD-10-CM | POA: Diagnosis not present

## 2014-07-27 NOTE — H&P (Signed)
OFFICE VISIT NOTES COPIED TO EPIC FOR DOCUMENTATION   [name]. Stanke 08-07-14 8:39 AM Location: [facility] Patient #: [id number] DOB: [birth year] Widowed / Language: English / Race: Black or African American Female    History of Present Illness([name], AGNP-C; 08-07-14 1:48 PM) The patient is a 74 year old female who presents for a follow-up for Chest pain. The last clinic visit was 1 month(s) ago. Management changes made at the last visit include ordering test(s) (lexi myoview and echo). Symptoms include chest pain and dyspnea. The pain is located in the substernal area. The pain radiates to the left arm. The patient describes the pain as sharp. Onset was gradual 2 month(s) ago. The symptoms occur intermittently and during the day. The episodes occur 1 times a day and last for 5 minutes. The patient describes this as moderate in severity and improving. Symptoms are exacerbated by exercise/activity. Symptoms are relieved by resting and nitroglycerin (Imdur). Associated symptoms include nausea and sweating, while associated symptoms do not include syncope. Current treatment includes aspirin, ACE inhibitors, beta-blocker therapy, calcium antagonists and nitrates. By report there is good compliance with treatment, good tolerance of treatment and fair symptom control. Risk factors include diabetes, high LDL cholesterol, hypertension and obesity.     Problem List/Past Medical([name]; 08/07/2014 1:15 PM) Renal cancer, left (C64.2) Asthma (J45.909) Hyperlipidemia, group A (E78.0) Obstructive sleep apnea syndrome (G47.33). could not tolerate CPAP Diabetes type 2, uncontrolled (E11.65). Labs 05/02/2014: HbA1c 8.2% Benign essential hypertension (I10) Shortness of breath on exertion (R06.02) Exertional chest pain (R07.9) Renal failure (N19)    Allergies([name]; 2014/08/07 1:15 PM) Sulfa Drugs. Itching.    Family History([name];  08/07/14 1:15 PM) Mother. Deceased. at unknown age; CAD, htn, Hx Stroke Father. Deceased. at unkown age; from unknown cause Hx of Hardening of the Arteries Siblings. Only Child    Social History([name]; 2014/08/07 1:21 PM) Current tobacco use. Never smoker. Non Drinker/No Alcohol Use Marital status. Widowed. Living Situation. Lives alone. Number of Children. 2. 1 Deceased from Aids    Past Surgical History([name]; Aug 07, 2014 1:22 PM) SPINE SURGERY. 2010 APPENDECTOMY. 1949 ABDOMINAL HYSTERECTOMY. In her 54's RENAL MASS EXCISION. 2013 Cancer    Medication History([name]; 07-Aug-2014 1:28 PM) Isosorbide Mononitrate ER (30MG  Tablet ER 24HR, 1 (one) Tablet ER 24HR Oral daily, Taken starting 06/30/2014) Active. Nitrostat (0.4MG  Tab Sublingual, 1 (one) Tab Sublingual Sublingual every 5 minutes as needed for chest pain up to 3 doses, Taken starting 06/30/2014) Active. HumuLIN 70/30 ((70-30) 100UNIT/ML Suspension, inject 65 units in the AM Subcutaneous 55 units in the PM) Active. Aspirin (81MG  Tablet Chewable, 1 Oral at bedtime) Active. Gabapentin (300MG  Capsule, 1 Oral at bedtime) Active. AmLODIPine Besylate (2.5MG  Tablet, 1 Oral daily) Active. Lisinopril (40MG  Tablet, 1 Oral daily) Active. Metoprolol Tartrate (25MG  Tablet, 1 Oral two times daily) Active. Atorvastatin Calcium (10MG  Tablet, 1 Oral daily) Active. Nexium 24HR (20MG  Capsule DR, 1 Oral daily) Active. Vitamin D3 (1000UNIT Capsule, 1 Oral daily) Active. Centrum Silver Adult 50+ (1 Oral daily) Active. Lipo-Flavonoid Plus (2 Oral three times daily, Stopped taking 07/24/2014) Discontinued: Patient Choice. Ventolin HFA (108 (90 Base)MCG/ACT Aerosol Soln, 2 puffs Inhalation as needed) Active. Medications Reconciled.    Diagnostic Studies History([name]; 07-Aug-2014 1:15 PM) Echocardiogram. 07/14/2014 Left ventricle cavity is normal in size. Moderate concentric hypertrophy of the left  ventricle. Basal septal hypertrophy. Normal global wall motion. Doppler evidence of grade I (impaired) diastolic dysfunction. Diastolic dysfunction findings suggests elevated LA/LV end diastolic pressure. Calculated  EF 68%. Left atrial cavity is normal in size. Cannot exclude PFO by color flow Doppler. Nuclear stress test. 07/08/2014 1. The resting electrocardiogram demonstrated normal sinus rhythm, normal resting conduction and no resting arrhythmias. Stress EKG is non-diagnostic for ischemia as it a pharmacologic stress using Lexiscan. Stress symptoms included dyspnea, dizziness. 2. Myocardial perfusion imaging is normal. Overall left ventricular systolic function was normal without regional wall motion abnormalities. The left ventricular ejection fraction was 67%. Treadmill stress test Colonoscopy. 2000 Sleep Study. 2011 Positive for Sleep Apnea, pt does not use CPAP    Review of Systems([name], AGNP-C; 07/25/2014 1:48 PM) General:Present- Fatigue. Not Present- Anorexia and Fever. Respiratory:Present- Difficulty Breathing on Exertion. Not Present- Cough, Decreased Exercise Tolerance and Dyspnea. Cardiovascular:Present- Chest Pain. Not Present- Claudications, Edema, Orthopnea, Paroxysmal Nocturnal Dyspnea and Shortness of Breath. Gastrointestinal:Not Present- Change in Bowel Habits, Constipation and Nausea. Neurological:Present- Dizziness. Not Present- Focal Neurological Symptoms. Endocrine:Not Present- Appetite Changes, Cold Intolerance and Heat Intolerance. Hematology:Not Present- Anemia, Petechiae and Prolonged Bleeding.    Vitals([name]; 07/25/2014 1:31 PM) 07/25/2014 1:16 PM Weight: 158 lb Height: 59 in Body Surface Area: 1.73 m Body Mass Index: 31.91 kg/m Pulse: 79 (Regular) P.OX: 98% (Room air) BP: 122/68 (Sitting, Left Arm, Standard)     Physical Exam([name], AGNP-C; 07/25/2014 1:48 PM) The physical exam findings are as  follows:   General Mental Status- Alert. General Appearance- Cooperative and Appears stated age. Not in acute distress. Orientation- Oriented X3. Build & Nutrition- Moderately built and Mildly obese.   Head and Neck Thyroid Gland Characteristics- no palpable nodules. no palpable enlargement.   Chest and Lung Exam Palpation:Tender- No chest wall tenderness. Auscultation: Breath sounds:- Clear.   Cardiovascular Inspection:Jugular vein- Right- No Distention. Auscultation:Heart Sounds- S1 WNL, S2 WNL and No gallop present. Murmurs & Other Heart Sounds: Murmur:- No murmur.   Abdomen Palpation/Percussion:Palpation and Percussion of the abdomen reveal - Non Tender and No hepatosplenomegaly. Auscultation:Auscultation of the abdomen reveals - Bowel sounds normal.   Peripheral Vascular Lower Extremity:Inspection- Left- No Pigmentation or Varicose veins. Right- No Pigmentation or Varicose veins. Palpation:Edema- Left- No edema. Right- No edema. Femoral pulse- Left- Normal. Right- Normal. Popliteal pulse- Left- Normal. Right- Normal. Dorsalis pedis pulse- Left- Normal. Right- Normal. Posterior tibial pulse- Left- Normal. Right- Normal. Carotid arteries- Left- No Carotid bruit. Right- No Carotid bruit. Abdomen- No prominent abdominal aortic pulsation or epigastric bruit.   Neurologic Motor:- Grossly intact without any focal deficits.   Musculoskeletal Global Assessment Left Lower Extremity - normal range of motion without pain. Right Lower Extremity- normal range of motion without pain.    Assessment & Plan([name], AGNP-C; 07/25/2014 2:33 PM) Angina pectoris (I20.9) Impression: EKG 06/30/2014: Normal sinus rhythm at rate of 79 bpm, normal intervals. Ischemia, normal EKG.  Nuclear stress test. 07/08/2014 1. The resting electrocardiogram demonstrated normal sinus rhythm, normal resting conduction and no resting  arrhythmias. Stress EKG is non-diagnostic for ischemia as it a pharmacologic stress using Lexiscan. Stress symptoms included dyspnea, dizziness. 2. Myocardial perfusion imaging is normal. Overall left ventricular systolic function was normal without regional wall motion abnormalities. The left ventricular ejection fraction was 67%. Current Plans l METABOLIC PANEL, BASIC (99991111) l CBC & PLATELETS (AUTO) (85027) l PT (PROTHROMBIN TIME) (13086)  Shortness of breath on exertion (R06.02)  Benign essential hypertension (I10)  Uncontrolled type 2 diabetes mellitus without complication, with long-term current use of insulin (E11.65) Story: Labs 05/02/2014: HbA1c 8.2%  Hyperlipidemia, group A (E78.0) Current Plans l Mechanism of underlying disease  process and action of medications discussed with the patient. I discussed primary/secondary prevention and also dietary counseling was done. She presents for follow-up of exercise Myoview and echocardiogram performed due to symptoms concerning for angina. Although stress test was negative for evidence of ischemia, she continues to have significant dyspnea and chest pain on exertion, despite being on maximally tolerated medical therapy including amlodipine, isosorbide, beta blocker, statin, and aspirin. Schedule for cardiac catheterization, and possible angioplasty. We discussed regarding risks, benefits, alternatives to this including stress testing, CTA and continued medical therapy. Patient wants to proceed. Understands <1-2% risk of death, stroke, MI, urgent CABG, bleeding, infection, renal failure but not limited to these. Video recording of the procedure shown to the patient. She was reminded to take SL ntg as needed for chest pain not relieved by rest. Follow up after cath.  *I have discussed this case with Dr. Einar Gip and he personally examined the patient and participated in formulating the plan.* 07/25/2014     Signed  electronically by Neldon Labella, AGNP-C (07/25/2014 2:34 PM)  I have personally reviewed the patient's record and performed physical exam and agree with the assessment and plan of Ms. Neldon Labella, NP-C.  [name], MD 07/27/2014, 9:46 PM [facility] [place] Pager: [phone] Office: [phone] If no answer: Cell:  [phone]

## 2014-07-28 ENCOUNTER — Ambulatory Visit (HOSPITAL_COMMUNITY)
Admission: RE | Admit: 2014-07-28 | Discharge: 2014-07-28 | Disposition: A | Discharge status: Home / Self Care | Payer: Medicare Other | Source: Ambulatory Visit | Attending: Cardiology | Admitting: Cardiology

## 2014-07-28 ENCOUNTER — Encounter (HOSPITAL_COMMUNITY): Payer: Self-pay | Admitting: Cardiology

## 2014-07-28 ENCOUNTER — Encounter (HOSPITAL_COMMUNITY)
Admission: RE | Disposition: A | Discharge status: Home / Self Care | Payer: BC Managed Care – PPO | Source: Ambulatory Visit | Attending: Cardiology

## 2014-07-28 DIAGNOSIS — E1165 Type 2 diabetes mellitus with hyperglycemia: Secondary | ICD-10-CM | POA: Diagnosis not present

## 2014-07-28 DIAGNOSIS — R079 Chest pain, unspecified: Secondary | ICD-10-CM | POA: Diagnosis not present

## 2014-07-28 DIAGNOSIS — E780 Pure hypercholesterolemia: Secondary | ICD-10-CM | POA: Diagnosis not present

## 2014-07-28 DIAGNOSIS — I10 Essential (primary) hypertension: Secondary | ICD-10-CM | POA: Diagnosis not present

## 2014-07-28 DIAGNOSIS — Z85528 Personal history of other malignant neoplasm of kidney: Secondary | ICD-10-CM | POA: Diagnosis not present

## 2014-07-28 DIAGNOSIS — I209 Angina pectoris, unspecified: Secondary | ICD-10-CM | POA: Diagnosis not present

## 2014-07-28 HISTORY — PX: PERIPHERAL VASCULAR CATHETERIZATION: SHX172C

## 2014-07-28 HISTORY — PX: CARDIAC CATHETERIZATION: SHX172

## 2014-07-28 LAB — BASIC METABOLIC PANEL WITH GFR
Anion gap: 9 (ref 5–15)
BUN: 16 mg/dL (ref 6–20)
CO2: 24 mmol/L (ref 22–32)
Calcium: 9.3 mg/dL (ref 8.9–10.3)
Chloride: 106 mmol/L (ref 101–111)
Creatinine, Ser: 1.06 mg/dL — ABNORMAL HIGH (ref 0.44–1.00)
GFR calc Af Amer: 58 mL/min — ABNORMAL LOW
GFR calc non Af Amer: 50 mL/min — ABNORMAL LOW
Glucose, Bld: 181 mg/dL — ABNORMAL HIGH (ref 65–99)
Potassium: 3.9 mmol/L (ref 3.5–5.1)
Sodium: 139 mmol/L (ref 135–145)

## 2014-07-28 LAB — PROTIME-INR
INR: 0.95 (ref 0.00–1.49)
Prothrombin Time: 12.8 s (ref 11.6–15.2)

## 2014-07-28 LAB — GLUCOSE, CAPILLARY: GLUCOSE-CAPILLARY: 188 mg/dL — AB (ref 65–99)

## 2014-07-28 LAB — CBC
HCT: 38.3 % (ref 36.0–46.0)
Hemoglobin: 12.7 g/dL (ref 12.0–15.0)
MCH: 29.7 pg (ref 26.0–34.0)
MCHC: 33.2 g/dL (ref 30.0–36.0)
MCV: 89.7 fL (ref 78.0–100.0)
Platelets: 219 10*3/uL (ref 150–400)
RBC: 4.27 MIL/uL (ref 3.87–5.11)
RDW: 14 % (ref 11.5–15.5)
WBC: 5.9 10*3/uL (ref 4.0–10.5)

## 2014-07-28 SURGERY — LEFT HEART CATH AND CORONARY ANGIOGRAPHY
Anesthesia: LOCAL

## 2014-07-28 MED ORDER — NITROGLYCERIN 1 MG/10 ML FOR IR/CATH LAB
INTRA_ARTERIAL | Status: AC
  Filled 2014-07-28: qty 10

## 2014-07-28 MED ORDER — MIDAZOLAM HCL 2 MG/2ML IJ SOLN
INTRAMUSCULAR | Status: AC
  Filled 2014-07-28: qty 2

## 2014-07-28 MED ORDER — SODIUM CHLORIDE 0.9 % IJ SOLN: 3.0000 mL | INTRAMUSCULAR | Status: DC | PRN

## 2014-07-28 MED ORDER — NITROGLYCERIN 1 MG/10 ML FOR IR/CATH LAB
INTRA_ARTERIAL | Status: DC | PRN
  Administered 2014-07-28: 200 ug via INTRACORONARY

## 2014-07-28 MED ORDER — SODIUM CHLORIDE 0.9 % IJ SOLN
3.0000 mL | Freq: Two times a day (BID) | INTRAMUSCULAR | Status: DC
Start: 2014-07-28 — End: 2014-07-28

## 2014-07-28 MED ORDER — DIPHENHYDRAMINE HCL 50 MG/ML IJ SOLN
INTRAMUSCULAR | Status: DC | PRN
  Administered 2014-07-28: 12.5 mg via INTRAVENOUS

## 2014-07-28 MED ORDER — HEPARIN SODIUM (PORCINE) 1000 UNIT/ML IJ SOLN
INTRAMUSCULAR | Status: DC | PRN
  Administered 2014-07-28: 4000 [IU] via INTRAVENOUS

## 2014-07-28 MED ORDER — SODIUM CHLORIDE 0.9 % WEIGHT BASED INFUSION: 3.0000 mL/kg/h | INTRAVENOUS | Status: DC

## 2014-07-28 MED ORDER — IOHEXOL 350 MG/ML SOLN
INTRAVENOUS | Status: DC | PRN
  Administered 2014-07-28: 70 mL via INTRAVENOUS

## 2014-07-28 MED ORDER — SODIUM CHLORIDE 0.9 % WEIGHT BASED INFUSION
3.0000 mL/kg/h | INTRAVENOUS | Status: DC
  Administered 2014-07-28: 3 mL/kg/h via INTRAVENOUS

## 2014-07-28 MED ORDER — HEPARIN (PORCINE) IN NACL 2-0.9 UNIT/ML-% IJ SOLN
INTRAMUSCULAR | Status: AC
  Filled 2014-07-28: qty 1000

## 2014-07-28 MED ORDER — ASPIRIN 81 MG PO CHEW
81.0000 mg | CHEWABLE_TABLET | ORAL | Status: AC
  Administered 2014-07-28: 81 mg via ORAL

## 2014-07-28 MED ORDER — SODIUM CHLORIDE 0.9 % IJ SOLN: 3.0000 mL | Freq: Two times a day (BID) | INTRAMUSCULAR | Status: DC

## 2014-07-28 MED ORDER — LIDOCAINE HCL (PF) 1 % IJ SOLN
INTRAMUSCULAR | Status: AC
  Filled 2014-07-28: qty 30

## 2014-07-28 MED ORDER — SODIUM CHLORIDE 0.9 % IV SOLN: 250.0000 mL | INTRAVENOUS | Status: DC | PRN

## 2014-07-28 MED ORDER — HEPARIN SODIUM (PORCINE) 1000 UNIT/ML IJ SOLN
INTRAMUSCULAR | Status: AC
  Filled 2014-07-28: qty 1

## 2014-07-28 MED ORDER — VERAPAMIL HCL 2.5 MG/ML IV SOLN
INTRAVENOUS | Status: AC
  Filled 2014-07-28: qty 2

## 2014-07-28 MED ORDER — ASPIRIN 81 MG PO CHEW
CHEWABLE_TABLET | ORAL | Status: AC
  Administered 2014-07-28: 81 mg via ORAL
  Filled 2014-07-28: qty 1

## 2014-07-28 MED ORDER — DIPHENHYDRAMINE HCL 50 MG/ML IJ SOLN
INTRAMUSCULAR | Status: AC
  Filled 2014-07-28: qty 1

## 2014-07-28 MED ORDER — MIDAZOLAM HCL 2 MG/2ML IJ SOLN
INTRAMUSCULAR | Status: DC | PRN
  Administered 2014-07-28: 1 mg via INTRAVENOUS

## 2014-07-28 MED ORDER — VERAPAMIL HCL 2.5 MG/ML IV SOLN
INTRAVENOUS | Status: DC | PRN
  Administered 2014-07-28: 08:00:00 via INTRA_ARTERIAL

## 2014-07-28 MED ORDER — SODIUM CHLORIDE 0.9 % WEIGHT BASED INFUSION: 1.0000 mL/kg/h | INTRAVENOUS | Status: DC

## 2014-07-28 SURGICAL SUPPLY — 8 items
CATH OPTITORQUE TIG 4.0 5F (CATHETERS) ×2 IMPLANT
DEVICE RAD COMP TR BAND LRG (VASCULAR PRODUCTS) ×2 IMPLANT
GLIDESHEATH SLEND A-KIT 6F 20G (SHEATH) ×2 IMPLANT
KIT HEART LEFT (KITS) ×2 IMPLANT
PACK CARDIAC CATHETERIZATION (CUSTOM PROCEDURE TRAY) ×2 IMPLANT
TRANSDUCER W/STOPCOCK (MISCELLANEOUS) ×2 IMPLANT
TUBING CIL FLEX 10 FLL-RA (TUBING) ×2 IMPLANT
WIRE SAFE-T 1.5MM-J .035X260CM (WIRE) ×2 IMPLANT

## 2014-07-28 NOTE — Interval H&P Note (Signed)
History and Physical Interval Note:  07/28/2014 7:40 AM  [name]  has presented today for surgery, with the diagnosis of c/p  The various methods of treatment have been discussed with the patient and family. After consideration of risks, benefits and other options for treatment, the patient has consented to  Procedure(s): Left Heart Cath and Coronary Angiography (N/A) and possible PCI as a surgical intervention .  The patient's history has been reviewed, patient examined, no change in status, stable for surgery.  I have reviewed the patient's chart and labs.  Questions were answered to the patient's satisfaction.   Ischemic Symptoms? CCS III (Marked limitation of ordinary activity) Anti-ischemic Medical Therapy? Maximal Medical Therapy (2 or more classes of medications) Non-invasive Test Results? Low-risk stress test findings: cardiac mortality <1%/year Prior CABG? No Previous CABG   Patient Information:   1-2V CAD, no prox LAD  A (7)  Indication: 15; Score: 7   Patient Information:   CTO of 1 vessel, no other CAD  U (6)  Indication: 25; Score: 6   Patient Information:   1V CAD with prox LAD  A (8)  Indication: 31; Score: 8   Patient Information:   2V-CAD with prox LAD  A (8)  Indication: 37; Score: 8   Patient Information:   3V-CAD without LMCA  A (8)  Indication: 43; Score: 8   Patient Information:   3V-CAD without LMCA With Abnormal LV systolic function  A (9)  Indication: 48; Score: 9   Patient Information:   LMCA-CAD  A (9)  Indication: 49; Score: 9   Patient Information:   2V-CAD with prox LAD PCI  A (7)  Indication: 62; Score: 7   Patient Information:   2V-CAD with prox LAD CABG  A (8)  Indication: 62; Score: 8   Patient Information:   3V-CAD without LMCA With Low CAD burden(i.e., 3 focal stenoses, low SYNTAX score) PCI  A (7)  Indication: 63; Score: 7   Patient Information:   3V-CAD without LMCA With Low CAD  burden(i.e., 3 focal stenoses, low SYNTAX score) CABG  A (9)  Indication: 63; Score: 9   Patient Information:   3V-CAD without LMCA E06c - Intermediate-high CAD burden (i.e., multiple diffuse lesions, presence of CTO, or high SYNTAX score) PCI  U (4)  Indication: 64; Score: 4   Patient Information:   3V-CAD without LMCA E06c - Intermediate-high CAD burden (i.e., multiple diffuse lesions, presence of CTO, or high SYNTAX score) CABG  A (9)  Indication: 64; Score: 9   Patient Information:   LMCA-CAD With Isolated LMCA stenosis  PCI  U (6)  Indication: 65; Score: 6   Patient Information:   LMCA-CAD With Isolated LMCA stenosis  CABG  A (9)  Indication: 65; Score: 9   Patient Information:   LMCA-CAD Additional CAD, low CAD burden (i.e., 1- to 2-vessel additional involvement, low SYNTAX score) PCI  U (5)  Indication: 66; Score: 5   Patient Information:   LMCA-CAD Additional CAD, low CAD burden (i.e., 1- to 2-vessel additional involvement, low SYNTAX score) CABG  A (9)  Indication: 66; Score: 9   Patient Information:   LMCA-CAD Additional CAD, intermediate-high CAD burden (i.e., 3-vessel involvement, presence of CTO, or high SYNTAX score) PCI  I (3)  Indication: 67; Score: 3   Patient Information:   LMCA-CAD Additional CAD, intermediate-high CAD burden (i.e., 3-vessel involvement, presence of CTO, or high SYNTAX score) CABG  A (9)  Indication: 67; Score: 9  [name]

## 2014-07-28 NOTE — Discharge Instructions (Signed)
Radial Site Care °Refer to this sheet in the next few weeks. These instructions provide you with information on caring for yourself after your procedure. Your caregiver may also give you more specific instructions. Your treatment has been planned according to current medical practices, but problems sometimes occur. Call your caregiver if you have any problems or questions after your procedure. °HOME CARE INSTRUCTIONS °· You may shower the day after the procedure. Remove the bandage (dressing) and gently wash the site with plain soap and water. Gently pat the site dry. °· Do not apply powder or lotion to the site. °· Do not submerge the affected site in water for 3 to 5 days. °· Inspect the site at least twice daily. °· Do not flex or bend the affected arm for 24 hours. °· No lifting over 5 pounds (2.3 kg) for 5 days after your procedure. °· Do not drive home if you are discharged the same day of the procedure. Have someone else drive you. °· You may drive 24 hours after the procedure unless otherwise instructed by your caregiver. °· Do not operate machinery or power tools for 24 hours. °· A responsible adult should be with you for the first 24 hours after you arrive home. °What to expect: °· Any bruising will usually fade within 1 to 2 weeks. °· Blood that collects in the tissue (hematoma) may be painful to the touch. It should usually decrease in size and tenderness within 1 to 2 weeks. °SEEK IMMEDIATE MEDICAL CARE IF: °· You have unusual pain at the radial site. °· You have redness, warmth, swelling, or pain at the radial site. °· You have drainage (other than a small amount of blood on the dressing). °· You have chills. °· You have a fever or persistent symptoms for more than 72 hours. °· You have a fever and your symptoms suddenly get worse. °· Your arm becomes pale, cool, tingly, or numb. °· You have heavy bleeding from the site. Hold pressure on the site and call 911. °Document Released: 03/09/2010 Document  Revised: 04/29/2011 Document Reviewed: 03/09/2010 °ExitCare® Patient Information ©2015 [employer]. This information is not intended to replace advice given to you by your health care provider. Make sure you discuss any questions you have with your health care provider. ° °

## 2014-07-29 MED FILL — Heparin Sodium (Porcine) 2 Unit/ML in Sodium Chloride 0.9%: INTRAMUSCULAR | Qty: 1000 | Status: AC

## 2014-07-29 MED FILL — Lidocaine HCl Local Preservative Free (PF) Inj 1%: INTRAMUSCULAR | Qty: 30 | Status: AC

## 2014-08-03 DIAGNOSIS — R3916 Straining to void: Secondary | ICD-10-CM | POA: Diagnosis not present

## 2014-08-03 DIAGNOSIS — R3919 Other difficulties with micturition: Secondary | ICD-10-CM | POA: Diagnosis not present

## 2014-08-03 DIAGNOSIS — R339 Retention of urine, unspecified: Secondary | ICD-10-CM | POA: Diagnosis not present

## 2014-08-03 DIAGNOSIS — R81 Glycosuria: Secondary | ICD-10-CM | POA: Diagnosis not present

## 2014-08-04 DIAGNOSIS — I10 Essential (primary) hypertension: Secondary | ICD-10-CM | POA: Diagnosis not present

## 2014-08-04 DIAGNOSIS — I209 Angina pectoris, unspecified: Secondary | ICD-10-CM | POA: Diagnosis not present

## 2014-08-04 DIAGNOSIS — K219 Gastro-esophageal reflux disease without esophagitis: Secondary | ICD-10-CM | POA: Diagnosis not present

## 2014-08-10 ENCOUNTER — Encounter: Payer: Self-pay | Admitting: Podiatry

## 2014-08-10 ENCOUNTER — Ambulatory Visit (INDEPENDENT_AMBULATORY_CARE_PROVIDER_SITE_OTHER): Payer: Medicare Other | Admitting: Podiatry

## 2014-08-10 VITALS — BP 132/69 | HR 86 | Resp 12

## 2014-08-10 DIAGNOSIS — M79673 Pain in unspecified foot: Secondary | ICD-10-CM | POA: Diagnosis not present

## 2014-08-10 DIAGNOSIS — E1342 Other specified diabetes mellitus with diabetic polyneuropathy: Secondary | ICD-10-CM

## 2014-08-10 DIAGNOSIS — G629 Polyneuropathy, unspecified: Secondary | ICD-10-CM

## 2014-08-10 DIAGNOSIS — E1142 Type 2 diabetes mellitus with diabetic polyneuropathy: Secondary | ICD-10-CM

## 2014-08-10 NOTE — Progress Notes (Signed)
   Subjective:    Patient ID: [name], female    DOB: [birth year], 74 y.o.   MRN: [id number]  HPI ''B/L FOOT ARE DOING MUCH BETTER BUT STILL HAVE TINGLING SENSATION AT NIGHT.'' Patient is currently taking gabapentin 300 mg by mouth twice a day with reduction of symptoms, however, still has some paresthesias at night which are uncomfortable. She denies any difficulty from the medication including drowsiness or peripheral edema  Review of Systems  HENT: Positive for ear pain.   Musculoskeletal: Positive for back pain.       Objective:   Physical Exam  Orientated 3  Vascular: DP and PT pulses 2/4 bilaterally No peripheral edema noted bilaterally  Neurological: Vibratory sensation intact bilaterally Sensation to 10 g monofilament wire intact 5/5 bilaterally  Dermatological: No open skin lesions noted bilaterally  Musculoskeletal: HAV and hammertoe second bilaterally        Assessment & Plan:   Assessment: Satisfactory vascular status Reduce symptoms from diabetic peripheral neuropathy  Plan: Patient advised to increase gabapentin 300 mg by mouth 3 times a day. If this 3 times a day reduces symptoms and maintain this dosing regimen if not then Take one 300 mg gabapentin a.m. and two 300 mg gabapentin at bedtime  Reevaluate 30 days

## 2014-08-10 NOTE — Patient Instructions (Signed)
Increase gabapentin 300 mg from one twice a day to one 300 mg gabapentin 3 times a day if still having uncomfortable tingling at bedtime then see instructions below   One 300 mg gabapentin midday and two 300 mg gabapentin at bedtime

## 2014-08-16 DIAGNOSIS — R339 Retention of urine, unspecified: Secondary | ICD-10-CM | POA: Diagnosis not present

## 2014-08-16 DIAGNOSIS — R3916 Straining to void: Secondary | ICD-10-CM | POA: Diagnosis not present

## 2014-08-16 DIAGNOSIS — R81 Glycosuria: Secondary | ICD-10-CM | POA: Diagnosis not present

## 2014-08-27 ENCOUNTER — Emergency Department (HOSPITAL_BASED_OUTPATIENT_CLINIC_OR_DEPARTMENT_OTHER)
Admission: EM | Admit: 2014-08-27 | Discharge: 2014-08-27 | Disposition: A | Discharge status: Home / Self Care | Payer: Medicare Other | Attending: Emergency Medicine | Admitting: Emergency Medicine

## 2014-08-27 ENCOUNTER — Encounter (HOSPITAL_BASED_OUTPATIENT_CLINIC_OR_DEPARTMENT_OTHER): Payer: Self-pay | Admitting: Emergency Medicine

## 2014-08-27 DIAGNOSIS — N189 Chronic kidney disease, unspecified: Secondary | ICD-10-CM | POA: Diagnosis not present

## 2014-08-27 DIAGNOSIS — R350 Frequency of micturition: Secondary | ICD-10-CM | POA: Diagnosis present

## 2014-08-27 DIAGNOSIS — Z9889 Other specified postprocedural states: Secondary | ICD-10-CM | POA: Insufficient documentation

## 2014-08-27 DIAGNOSIS — K219 Gastro-esophageal reflux disease without esophagitis: Secondary | ICD-10-CM | POA: Insufficient documentation

## 2014-08-27 DIAGNOSIS — Z8669 Personal history of other diseases of the nervous system and sense organs: Secondary | ICD-10-CM | POA: Insufficient documentation

## 2014-08-27 DIAGNOSIS — N390 Urinary tract infection, site not specified: Secondary | ICD-10-CM | POA: Insufficient documentation

## 2014-08-27 DIAGNOSIS — Z85528 Personal history of other malignant neoplasm of kidney: Secondary | ICD-10-CM | POA: Diagnosis not present

## 2014-08-27 DIAGNOSIS — Z7982 Long term (current) use of aspirin: Secondary | ICD-10-CM | POA: Insufficient documentation

## 2014-08-27 DIAGNOSIS — I129 Hypertensive chronic kidney disease with stage 1 through stage 4 chronic kidney disease, or unspecified chronic kidney disease: Secondary | ICD-10-CM | POA: Insufficient documentation

## 2014-08-27 DIAGNOSIS — J45909 Unspecified asthma, uncomplicated: Secondary | ICD-10-CM | POA: Insufficient documentation

## 2014-08-27 DIAGNOSIS — Z79899 Other long term (current) drug therapy: Secondary | ICD-10-CM | POA: Diagnosis not present

## 2014-08-27 DIAGNOSIS — E780 Pure hypercholesterolemia: Secondary | ICD-10-CM | POA: Insufficient documentation

## 2014-08-27 DIAGNOSIS — Z794 Long term (current) use of insulin: Secondary | ICD-10-CM | POA: Insufficient documentation

## 2014-08-27 DIAGNOSIS — E119 Type 2 diabetes mellitus without complications: Secondary | ICD-10-CM | POA: Diagnosis not present

## 2014-08-27 DIAGNOSIS — M545 Low back pain: Secondary | ICD-10-CM | POA: Diagnosis not present

## 2014-08-27 LAB — URINE MICROSCOPIC-ADD ON

## 2014-08-27 LAB — URINALYSIS, ROUTINE W REFLEX MICROSCOPIC
BILIRUBIN URINE: NEGATIVE
GLUCOSE, UA: NEGATIVE mg/dL
HGB URINE DIPSTICK: NEGATIVE
KETONES UR: NEGATIVE mg/dL
Nitrite: NEGATIVE
PH: 5.5 (ref 5.0–8.0)
Protein, ur: NEGATIVE mg/dL
Specific Gravity, Urine: 1.012 (ref 1.005–1.030)
Urobilinogen, UA: 0.2 mg/dL (ref 0.0–1.0)

## 2014-08-27 MED ORDER — CEPHALEXIN 500 MG PO CAPS: 500.0000 mg | ORAL_CAPSULE | Freq: Two times a day (BID) | ORAL | Status: DC

## 2014-08-27 MED ORDER — CEFTRIAXONE SODIUM 1 G IJ SOLR
1.0000 g | Freq: Once | INTRAMUSCULAR | Status: AC
  Administered 2014-08-27: 1 g via INTRAMUSCULAR
  Filled 2014-08-27: qty 10

## 2014-08-27 MED ORDER — SILODOSIN 4 MG PO CAPS: 4.0000 mg | ORAL_CAPSULE | Freq: Every day | ORAL | Status: DC

## 2014-08-27 MED ORDER — LIDOCAINE HCL (PF) 1 % IJ SOLN
INTRAMUSCULAR | Status: AC
  Administered 2014-08-27: 2.1 mL
  Filled 2014-08-27: qty 5

## 2014-08-27 NOTE — ED Notes (Signed)
Pt states has "pressure" at suprapubic area after voiding, feels like she is not emptying.

## 2014-08-27 NOTE — ED Provider Notes (Signed)
Medical screening examination/treatment/procedure(s) were conducted as a shared visit with non-physician practitioner(s) and myself.  I personally evaluated the patient during the encounter.   EKG Interpretation None      Patient seen by me. Patient here with a complaint of urinary frequency and some mild pelvic pressure for about 3 weeks. Patient followed by [facility] seen not too long ago next appointment those not to January was given some meds for the symptoms but they did not include antibody. Symptoms have not resolved. Patient nontoxic no acute distress lungs clear bilaterally heart regular rate and rhythm without murmurs abdomen soft nontender. Urinalysis suggestive of early urinary tract infection. We'll send a culture. Also will have her follow back up with urology in the next week. And her medication that was helping with the flow problems will be renewed.  Results for orders placed or performed during the hospital encounter of 08/27/14  Urinalysis, Routine w reflex microscopic-may I&O cath if menses (not at [facility])  Result Value Ref Range   Color, Urine YELLOW YELLOW   APPearance CLOUDY (A) CLEAR   Specific Gravity, Urine 1.012 1.005 - 1.030   pH 5.5 5.0 - 8.0   Glucose, UA NEGATIVE NEGATIVE mg/dL   Hgb urine dipstick NEGATIVE NEGATIVE   Bilirubin Urine NEGATIVE NEGATIVE   Ketones, ur NEGATIVE NEGATIVE mg/dL   Protein, ur NEGATIVE NEGATIVE mg/dL   Urobilinogen, UA 0.2 0.0 - 1.0 mg/dL   Nitrite NEGATIVE NEGATIVE   Leukocytes, UA MODERATE (A) NEGATIVE  Urine microscopic-add on  Result Value Ref Range   Squamous Epithelial / LPF RARE RARE   WBC, UA 7-10 <3 WBC/hpf   RBC / HPF 0-2 <3 RBC/hpf   Bacteria, UA FEW (A) RARE     Fredia Sorrow, MD 08/27/14 1410

## 2014-08-27 NOTE — ED Notes (Signed)
Pt just voided (urine sent to lab) will assess via bladder scanner

## 2014-08-27 NOTE — ED Notes (Signed)
Pt in c/o urinary frequency and mild pelvic pressure x 3 weeks. Was seen at [facility] and given meds but symptoms have not resolved.

## 2014-08-27 NOTE — ED Provider Notes (Signed)
CSN: [id number]     Arrival date & time 08/27/14  1208 History   First MD Initiated Contact with Patient 08/27/14 1232     Chief Complaint  Patient presents with  . Urinary Frequency     (Consider location/radiation/quality/duration/timing/severity/associated sxs/prior Treatment) HPI   Blood pressure 148/63, pulse 86, temperature 99.2 F (37.3 C), temperature source Oral, resp. rate 18, height 4\' 11"  (1.499 m), weight 158 lb (71.668 kg), SpO2 99 %.  [name] is a 74 y.o. female S medical history significant for insulin-dependent diabetes, chronic renal insufficiency, asthma complaining of sensation of incomplete void and a constant sensation that she needs to urinate. Patient feels a pressure in the suprapubic area after voiding, she also reports a 4 out of 10 right low back pain. This has been a problem for several months, she's been evaluated at [facility], she was started on Rapaflo, she saw them 2 weeks ago when they took her off Rapaflo but did not provide another alternative medication. Patient denies dysuria, hematuria, nausea, vomiting, flank pain  Past Medical History  Diagnosis Date  . Diabetes mellitus   . Hypertension   . Renal failure (ARF), acute on chronic   . Hypercholesteremia   . Reflux   . left renal ca dx'd 2012 (?)    surg only  . Asthma   . Sleep apnea    Past Surgical History  Procedure Laterality Date  . Renal mass excision    . Abdominal hysterectomy    . Appendectomy    . Spine surgery    . Cardiac catheterization N/A 07/28/2014    Procedure: Left Heart Cath and Coronary Angiography;  Surgeon: [name], MD;  Location: [facility];  Service: Cardiovascular;  Laterality: N/A;  . Peripheral vascular catheterization N/A 07/28/2014    Procedure: Aortic Arch Angiography;  Surgeon: [name], MD;  Location: [facility];  Service: Cardiovascular;  Laterality: N/A;   Family History  Problem Relation Age of Onset  . Coronary artery  disease    . Hypertension Mother    History  Substance Use Topics  . Smoking status: Never Smoker   . Smokeless tobacco: Never Used  . Alcohol Use: No   OB History    No data available     Review of Systems  10 systems reviewed and found to be negative, except as noted in the HPI.   Allergies  Sulfa antibiotics  Home Medications   Prior to Admission medications   Medication Sig Start Date End Date Taking? Authorizing Provider  albuterol (PROVENTIL,VENTOLIN) 90 MCG/ACT inhaler Inhale 1-2 puffs into the lungs every 4 (four) hours as needed. For shortness of breath and wheezing.    Historical Provider, MD  amLODipine (NORVASC) 2.5 MG tablet Take 2.5 mg by mouth daily.      Historical Provider, MD  aspirin 81 MG EC tablet Take 81 mg by mouth daily.      Historical Provider, MD  atorvastatin (LIPITOR) 10 MG tablet Take 10 mg by mouth daily.      Historical Provider, MD  BD INSULIN SYRINGE ULTRAFINE 31G X 15/64" 1 ML MISC  11/03/13   Historical Provider, MD  cephALEXin (KEFLEX) 500 MG capsule Take 1 capsule (500 mg total) by mouth 2 (two) times daily. 08/27/14   Nick Stults, PA-C  CHERATUSSIN AC 100-10 MG/5ML syrup Take 5 mLs by mouth 3 (three) times daily as needed for cough.  11/03/13   Historical Provider, MD  chlorpheniramine-HYDROcodone Amanda Cockayne)  10-8 MG/5ML LQCR Take 5 mLs by mouth every 12 (twelve) hours as needed for cough. 01/03/14   Harden Mo, MD  Cholecalciferol (VITAMIN D) 1000 UNITS capsule Take 1,000 Units by mouth daily.      Historical Provider, MD  esomeprazole (NEXIUM) 40 MG capsule Take 40 mg by mouth daily before breakfast.      Historical Provider, MD  gabapentin (NEURONTIN) 300 MG capsule TAKE 1 CAPSULE (300 MG TOTAL) BY MOUTH AT BEDTIME. 02/02/14   Richard Joelene Millin, DPM  insulin NPH-insulin regular (NOVOLIN 70/30) (70-30) 100 UNIT/ML injection Inject 55-65 Units into the skin 2 (two) times daily with a meal. 65 in am, 55 in pm    Historical Provider, MD    isosorbide mononitrate (IMDUR) 30 MG 24 hr tablet Take 30 mg by mouth daily.    Historical Provider, MD  lisinopril (PRINIVIL,ZESTRIL) 40 MG tablet Take 40 mg by mouth daily.      Historical Provider, MD  metoprolol tartrate (LOPRESSOR) 25 MG tablet Take 25 mg by mouth 2 (two) times daily.    Historical Provider, MD  Multiple Vitamin (MULTIVITAMIN) tablet Take 1 tablet by mouth daily.      Historical Provider, MD  nitroGLYCERIN (NITROSTAT) 0.4 MG SL tablet Place 0.4 mg under the tongue every 5 (five) minutes as needed for chest pain.    Historical Provider, MD  ONE TOUCH ULTRA TEST test strip  09/28/13   Historical Provider, MD  silodosin (RAPAFLO) 4 MG CAPS capsule Take 1 capsule (4 mg total) by mouth daily with breakfast. 08/27/14   Monico Blitz, PA-C  VENTOLIN HFA 108 (90 BASE) MCG/ACT inhaler  11/03/13   Historical Provider, MD   BP 122/70 mmHg  Pulse 65  Temp(Src) 99.2 F (37.3 C) (Oral)  Resp 18  Ht 4\' 11"  (1.499 m)  Wt 158 lb (71.668 kg)  BMI 31.89 kg/m2  SpO2 99% Physical Exam  Constitutional: She is oriented to person, place, and time. She appears well-developed and well-nourished. No distress.  HENT:  Head: Normocephalic.  Eyes: Conjunctivae and EOM are normal.  Cardiovascular: Normal rate.   Pulmonary/Chest: Effort normal and breath sounds normal. No stridor.  Abdominal: Soft. Bowel sounds are normal. She exhibits no distension and no mass. There is no tenderness. There is no rebound and no guarding.  Genitourinary:  No CVA tenderness to palpation bilaterally  Musculoskeletal: Normal range of motion.       Back:  Neurological: She is alert and oriented to person, place, and time.  Psychiatric: She has a normal mood and affect.  Nursing note and vitals reviewed.   ED Course  Procedures (including critical care time) Labs Review Labs Reviewed  URINALYSIS, ROUTINE W REFLEX MICROSCOPIC (NOT AT [facility]) - Abnormal; Notable for the following:    APPearance CLOUDY (*)     Leukocytes, UA MODERATE (*)    All other components within normal limits  URINE MICROSCOPIC-ADD ON - Abnormal; Notable for the following:    Bacteria, UA FEW (*)    All other components within normal limits  URINE CULTURE    Imaging Review No results found.   EKG Interpretation None      MDM   Final diagnoses:  UTI (lower urinary tract infection)    Filed Vitals:   08/27/14 1214 08/27/14 1438  BP: 148/63 122/70  Pulse: 86 65  Temp: 99.2 F (37.3 C)   TempSrc: Oral   Resp: 18 18  Height: 4\' 11"  (1.499 m)   Weight: 158  lb (71.668 kg)   SpO2: 99% 99%    Medications  cefTRIAXone (ROCEPHIN) injection 1 g (1 g Intramuscular Given 08/27/14 1410)  lidocaine (PF) (XYLOCAINE) 1 % injection (2.1 mLs  Given 08/27/14 1412)    [name] is a pleasant 74 y.o. female presenting with sensation of incomplete voiding urinary frequency with pelvic pressure and low back pain. Patient was taking apple flow but stopped taking it several weeks ago. No CVA tenderness to palpation, patient afebrile, well-appearing doubt Prilosec. Her urinalysis is consistent with infection, I have cultured this and we'll give the patient a gram of Rocephin and start her on Keflex. Will also restart her Rapaflo and encourage close follow up with urology.   This is a shared visit with the attending physician who personally evaluated the patient and agrees with the care plan.   Evaluation does not show pathology that would require ongoing emergent intervention or inpatient treatment. Pt is hemodynamically stable and mentating appropriately. Discussed findings and plan with patient/guardian, who agrees with care plan. All questions answered. Return precautions discussed and outpatient follow up given.   New Prescriptions   CEPHALEXIN (KEFLEX) 500 MG CAPSULE    Take 1 capsule (500 mg total) by mouth 2 (two) times daily.   SILODOSIN (RAPAFLO) 4 MG CAPS CAPSULE    Take 1 capsule (4 mg total) by mouth daily with  breakfast.         Monico Blitz, PA-C 08/27/14 1443

## 2014-08-27 NOTE — Discharge Instructions (Signed)
Please follow with your primary care doctor in the next 2 days for a check-up. They must obtain records for further management.   Do not hesitate to return to the Emergency Department for any new, worsening or concerning symptoms.    Urinary Tract Infection Urinary tract infections (UTIs) can develop anywhere along your urinary tract. Your urinary tract is your body's drainage system for removing wastes and extra water. Your urinary tract includes two kidneys, two ureters, a bladder, and a urethra. Your kidneys are a pair of bean-shaped organs. Each kidney is about the size of your fist. They are located below your ribs, one on each side of your spine. CAUSES Infections are caused by microbes, which are microscopic organisms, including fungi, viruses, and bacteria. These organisms are so small that they can only be seen through a microscope. Bacteria are the microbes that most commonly cause UTIs. SYMPTOMS  Symptoms of UTIs may vary by age and gender of the patient and by the location of the infection. Symptoms in young women typically include a frequent and intense urge to urinate and a painful, burning feeling in the bladder or urethra during urination. Older women and men are more likely to be tired, shaky, and weak and have muscle aches and abdominal pain. A fever may mean the infection is in your kidneys. Other symptoms of a kidney infection include pain in your back or sides below the ribs, nausea, and vomiting. DIAGNOSIS To diagnose a UTI, your caregiver will ask you about your symptoms. Your caregiver also will ask to provide a urine sample. The urine sample will be tested for bacteria and white blood cells. White blood cells are made by your body to help fight infection. TREATMENT  Typically, UTIs can be treated with medication. Because most UTIs are caused by a bacterial infection, they usually can be treated with the use of antibiotics. The choice of antibiotic and length of treatment depend  on your symptoms and the type of bacteria causing your infection. HOME CARE INSTRUCTIONS  If you were prescribed antibiotics, take them exactly as your caregiver instructs you. Finish the medication even if you feel better after you have only taken some of the medication.  Drink enough water and fluids to keep your urine clear or pale yellow.  Avoid caffeine, tea, and carbonated beverages. They tend to irritate your bladder.  Empty your bladder often. Avoid holding urine for long periods of time.  Empty your bladder before and after sexual intercourse.  After a bowel movement, women should cleanse from front to back. Use each tissue only once. SEEK MEDICAL CARE IF:   You have back pain.  You develop a fever.  Your symptoms do not begin to resolve within 3 days. SEEK IMMEDIATE MEDICAL CARE IF:   You have severe back pain or lower abdominal pain.  You develop chills.  You have nausea or vomiting.  You have continued burning or discomfort with urination. MAKE SURE YOU:   Understand these instructions.  Will watch your condition.  Will get help right away if you are not doing well or get worse. Document Released: 11/14/2004 Document Revised: 08/06/2011 Document Reviewed: 03/15/2011 [facility] Patient Information 2015 [place], [place]. This information is not intended to replace advice given to you by your health care provider. Make sure you discuss any questions you have with your health care provider.

## 2014-08-29 LAB — URINE CULTURE

## 2014-09-06 DIAGNOSIS — R339 Retention of urine, unspecified: Secondary | ICD-10-CM | POA: Diagnosis not present

## 2014-09-07 ENCOUNTER — Ambulatory Visit (INDEPENDENT_AMBULATORY_CARE_PROVIDER_SITE_OTHER): Payer: Medicare Other | Admitting: Podiatry

## 2014-09-07 ENCOUNTER — Encounter: Payer: Self-pay | Admitting: Podiatry

## 2014-09-07 VITALS — BP 125/60 | HR 72 | Resp 12

## 2014-09-07 DIAGNOSIS — M79673 Pain in unspecified foot: Secondary | ICD-10-CM | POA: Diagnosis not present

## 2014-09-07 DIAGNOSIS — G629 Polyneuropathy, unspecified: Secondary | ICD-10-CM | POA: Diagnosis not present

## 2014-09-07 DIAGNOSIS — E1342 Other specified diabetes mellitus with diabetic polyneuropathy: Secondary | ICD-10-CM | POA: Diagnosis not present

## 2014-09-07 DIAGNOSIS — E1142 Type 2 diabetes mellitus with diabetic polyneuropathy: Secondary | ICD-10-CM

## 2014-09-07 NOTE — Patient Instructions (Signed)
GU taking one 300 mg gabapentin at night to leave symptoms of painful tingling feet Return as needed or yearly Diabetes and Foot Care Diabetes may cause you to have problems because of poor blood supply (circulation) to your feet and legs. This may cause the skin on your feet to become thinner, break easier, and heal more slowly. Your skin may become dry, and the skin may peel and crack. You may also have nerve damage in your legs and feet causing decreased feeling in them. You may not notice minor injuries to your feet that could lead to infections or more serious problems. Taking care of your feet is one of the most important things you can do for yourself.  HOME CARE INSTRUCTIONS  Wear shoes at all times, even in the house. Do not go barefoot. Bare feet are easily injured.  Check your feet daily for blisters, cuts, and redness. If you cannot see the bottom of your feet, use a mirror or ask someone for help.  Wash your feet with warm water (do not use hot water) and mild soap. Then pat your feet and the areas between your toes until they are completely dry. Do not soak your feet as this can dry your skin.  Apply a moisturizing lotion or petroleum jelly (that does not contain alcohol and is unscented) to the skin on your feet and to dry, brittle toenails. Do not apply lotion between your toes.  Trim your toenails straight across. Do not dig under them or around the cuticle. File the edges of your nails with an emery board or nail file.  Do not cut corns or calluses or try to remove them with medicine.  Wear clean socks or stockings every day. Make sure they are not too tight. Do not wear knee-high stockings since they may decrease blood flow to your legs.  Wear shoes that fit properly and have enough cushioning. To break in new shoes, wear them for just a few hours a day. This prevents you from injuring your feet. Always look in your shoes before you put them on to be sure there are no objects  inside.  Do not cross your legs. This may decrease the blood flow to your feet.  If you find a minor scrape, cut, or break in the skin on your feet, keep it and the skin around it clean and dry. These areas may be cleansed with mild soap and water. Do not cleanse the area with peroxide, alcohol, or iodine.  When you remove an adhesive bandage, be sure not to damage the skin around it.  If you have a wound, look at it several times a day to make sure it is healing.  Do not use heating pads or hot water bottles. They may burn your skin. If you have lost feeling in your feet or legs, you may not know it is happening until it is too late.  Make sure your health care provider performs a complete foot exam at least annually or more often if you have foot problems. Report any cuts, sores, or bruises to your health care provider immediately. SEEK MEDICAL CARE IF:   You have an injury that is not healing.  You have cuts or breaks in the skin.  You have an ingrown nail.  You notice redness on your legs or feet.  You feel burning or tingling in your legs or feet.  You have pain or cramps in your legs and feet.  Your legs or  feet are numb.  Your feet always feel cold. SEEK IMMEDIATE MEDICAL CARE IF:   There is increasing redness, swelling, or pain in or around a wound.  There is a red line that goes up your leg.  Pus is coming from a wound.  You develop a fever or as directed by your health care provider.  You notice a bad smell coming from an ulcer or wound. Document Released: 02/02/2000 Document Revised: 10/07/2012 Document Reviewed: 07/14/2012 [facility] Patient Information 2015 [place], [place]. This information is not intended to replace advice given to you by your health care provider. Make sure you discuss any questions you have with your health care provider.

## 2014-09-07 NOTE — Progress Notes (Signed)
   Subjective:    Patient ID: [name], female    DOB: 1940/04/20, 74 y.o.   MRN: [id number]  HPI ''This patient presents today for follow-up care for treatment of symptomatic diabetic peripheral neuropathy. She has a history of tingling and burning at night. When she was taking 300 mg a gabapentin she still had some symptoms, however, was able to fall sleep and stay asleep and maintain her daily activities. On the visit of 08/10/2014 because patient is still was having symptoms we increase the gabapentin initially to 300 mg by mouth twice a day and if needed up to 3 times a day. Patient states that when she takes gabapentin 300 mg at bedtime and 1 in the morning she is extremely groggy and dizzy and was not able to function.. At this time patient says she is satisfied with the results of one gabapentin 300 mg at bedtime only. She denies any history of edema or drowsiness when taking 300 mg at bedtime   Review of Systems  All other systems reviewed and are negative.      Objective:   Physical Exam  Orientated 3  Vascular DP and PT pulses 2/4 bilaterally No peripheral edema noted bilaterally  Logical: Vibratory sensation intact bilaterally Sensation to 10 g monofilament wire intact 5/5 bilaterally  Dermatological: No open skin lesions noted bilaterally       Assessment & Plan:   Assessment: Diabetic peripheral neuropathy Patient's diabetic neuropathic pain is controlled to patient's satisfaction with gabapentin 300 mg at bedtime  Plan: At this time I advised patient to continue gabapentin 300 mg at bedtime only  Reappoint when necessary at patient's request or yearly

## 2014-10-12 DIAGNOSIS — H40013 Open angle with borderline findings, low risk, bilateral: Secondary | ICD-10-CM | POA: Diagnosis not present

## 2014-10-12 DIAGNOSIS — H524 Presbyopia: Secondary | ICD-10-CM | POA: Diagnosis not present

## 2014-11-10 ENCOUNTER — Ambulatory Visit (INDEPENDENT_AMBULATORY_CARE_PROVIDER_SITE_OTHER): Payer: Medicare Other | Admitting: Podiatry

## 2014-11-10 ENCOUNTER — Encounter: Payer: Self-pay | Admitting: Podiatry

## 2014-11-10 ENCOUNTER — Ambulatory Visit (INDEPENDENT_AMBULATORY_CARE_PROVIDER_SITE_OTHER): Payer: Medicare Other

## 2014-11-10 VITALS — BP 121/67 | HR 66 | Resp 16

## 2014-11-10 DIAGNOSIS — L03032 Cellulitis of left toe: Secondary | ICD-10-CM | POA: Diagnosis not present

## 2014-11-10 DIAGNOSIS — G629 Polyneuropathy, unspecified: Secondary | ICD-10-CM

## 2014-11-10 DIAGNOSIS — M79672 Pain in left foot: Secondary | ICD-10-CM | POA: Diagnosis not present

## 2014-11-10 DIAGNOSIS — L03119 Cellulitis of unspecified part of limb: Secondary | ICD-10-CM | POA: Diagnosis not present

## 2014-11-10 DIAGNOSIS — E1342 Other specified diabetes mellitus with diabetic polyneuropathy: Secondary | ICD-10-CM | POA: Diagnosis not present

## 2014-11-10 DIAGNOSIS — L02619 Cutaneous abscess of unspecified foot: Secondary | ICD-10-CM | POA: Diagnosis not present

## 2014-11-10 DIAGNOSIS — E1142 Type 2 diabetes mellitus with diabetic polyneuropathy: Secondary | ICD-10-CM

## 2014-11-10 MED ORDER — CIPROFLOXACIN HCL 500 MG PO TABS: 500.0000 mg | ORAL_TABLET | Freq: Two times a day (BID) | ORAL | Status: DC

## 2014-11-10 MED ORDER — DOXYCYCLINE HYCLATE 100 MG PO TABS: 100.0000 mg | ORAL_TABLET | Freq: Two times a day (BID) | ORAL | Status: DC

## 2014-11-10 NOTE — Progress Notes (Signed)
Subjective:     Patient ID: [name], female   DOB: 1940/10/17, 74 y.o.   MRN: [id number]  HPI patient states that she stepped on a toothpick on her left heel and that it's turn red and moderately swollen when pressed. Just have in several days ago   Review of Systems  All other systems reviewed and are negative.      Objective:   Physical Exam  Constitutional: She is oriented to person, place, and time.  Cardiovascular: Intact distal pulses.   Musculoskeletal: Normal range of motion.  Neurological: She is oriented to person, place, and time.  Skin: Skin is warm.  Nursing note and vitals reviewed.  neurovascular status was found to be intact with patient having a small portal on the medial aspect of the left heel not plantar surface but medial surface with redness surrounding the area of approximate 2 cm. It is localized and there is no proximal edema erythema and her sugar has been running like it typically does around 200-250. Patient does not have any systemic indications of infection and has normal temperature today     Assessment:     Foreign body trauma to the left heel with probable introduction of bacteria formation secondary to the injury    Plan:     Reviewed condition and explained to her that we are getting to try to get this better locally but it's possible if there is any deep infection or abscess that she may require IV antibiotics and for opening up the area. She understands completely and today we did a posterior tibial block and infiltration block around the area and then using sterile instrumentation open the area revealing localized irritation but no active pus formation but we did go ahead and culture. We flushed and applied sterile dressing and instructed on soaks and did as precautionary measure placed on doxycycline and also Cipro. Patient was given strict instructions of any systemic signs of infection were to occur or chills or fever or not feeling well she  is to go straight to the emergency room or if redness should spread from the area around the foreign body penetration site. We did mark the area

## 2014-11-12 ENCOUNTER — Other Ambulatory Visit: Payer: Self-pay | Admitting: Podiatry

## 2014-11-16 ENCOUNTER — Ambulatory Visit (INDEPENDENT_AMBULATORY_CARE_PROVIDER_SITE_OTHER): Payer: Medicare Other | Admitting: Ophthalmology

## 2014-11-16 DIAGNOSIS — H43813 Vitreous degeneration, bilateral: Secondary | ICD-10-CM | POA: Diagnosis not present

## 2014-11-16 DIAGNOSIS — H35033 Hypertensive retinopathy, bilateral: Secondary | ICD-10-CM

## 2014-11-16 DIAGNOSIS — I10 Essential (primary) hypertension: Secondary | ICD-10-CM | POA: Diagnosis not present

## 2014-11-16 DIAGNOSIS — E11319 Type 2 diabetes mellitus with unspecified diabetic retinopathy without macular edema: Secondary | ICD-10-CM

## 2014-11-16 DIAGNOSIS — E11329 Type 2 diabetes mellitus with mild nonproliferative diabetic retinopathy without macular edema: Secondary | ICD-10-CM | POA: Diagnosis not present

## 2014-11-17 DIAGNOSIS — R7309 Other abnormal glucose: Secondary | ICD-10-CM | POA: Diagnosis not present

## 2014-11-17 DIAGNOSIS — I132 Hypertensive heart and chronic kidney disease with heart failure and with stage 5 chronic kidney disease, or end stage renal disease: Secondary | ICD-10-CM | POA: Diagnosis not present

## 2014-11-17 DIAGNOSIS — I10 Essential (primary) hypertension: Secondary | ICD-10-CM | POA: Diagnosis not present

## 2014-11-22 DIAGNOSIS — E1122 Type 2 diabetes mellitus with diabetic chronic kidney disease: Secondary | ICD-10-CM | POA: Diagnosis not present

## 2014-11-22 DIAGNOSIS — I10 Essential (primary) hypertension: Secondary | ICD-10-CM | POA: Diagnosis not present

## 2014-11-24 IMAGING — CR DG CHEST 2V
2 series · 2 of 2 positions shown · non-contrast
Comparison: 12/27/2010 and 11/26/2010

CLINICAL DATA: Malignant neoplasm of the kidney.

CHEST - 2 VIEW

[w chest pa]
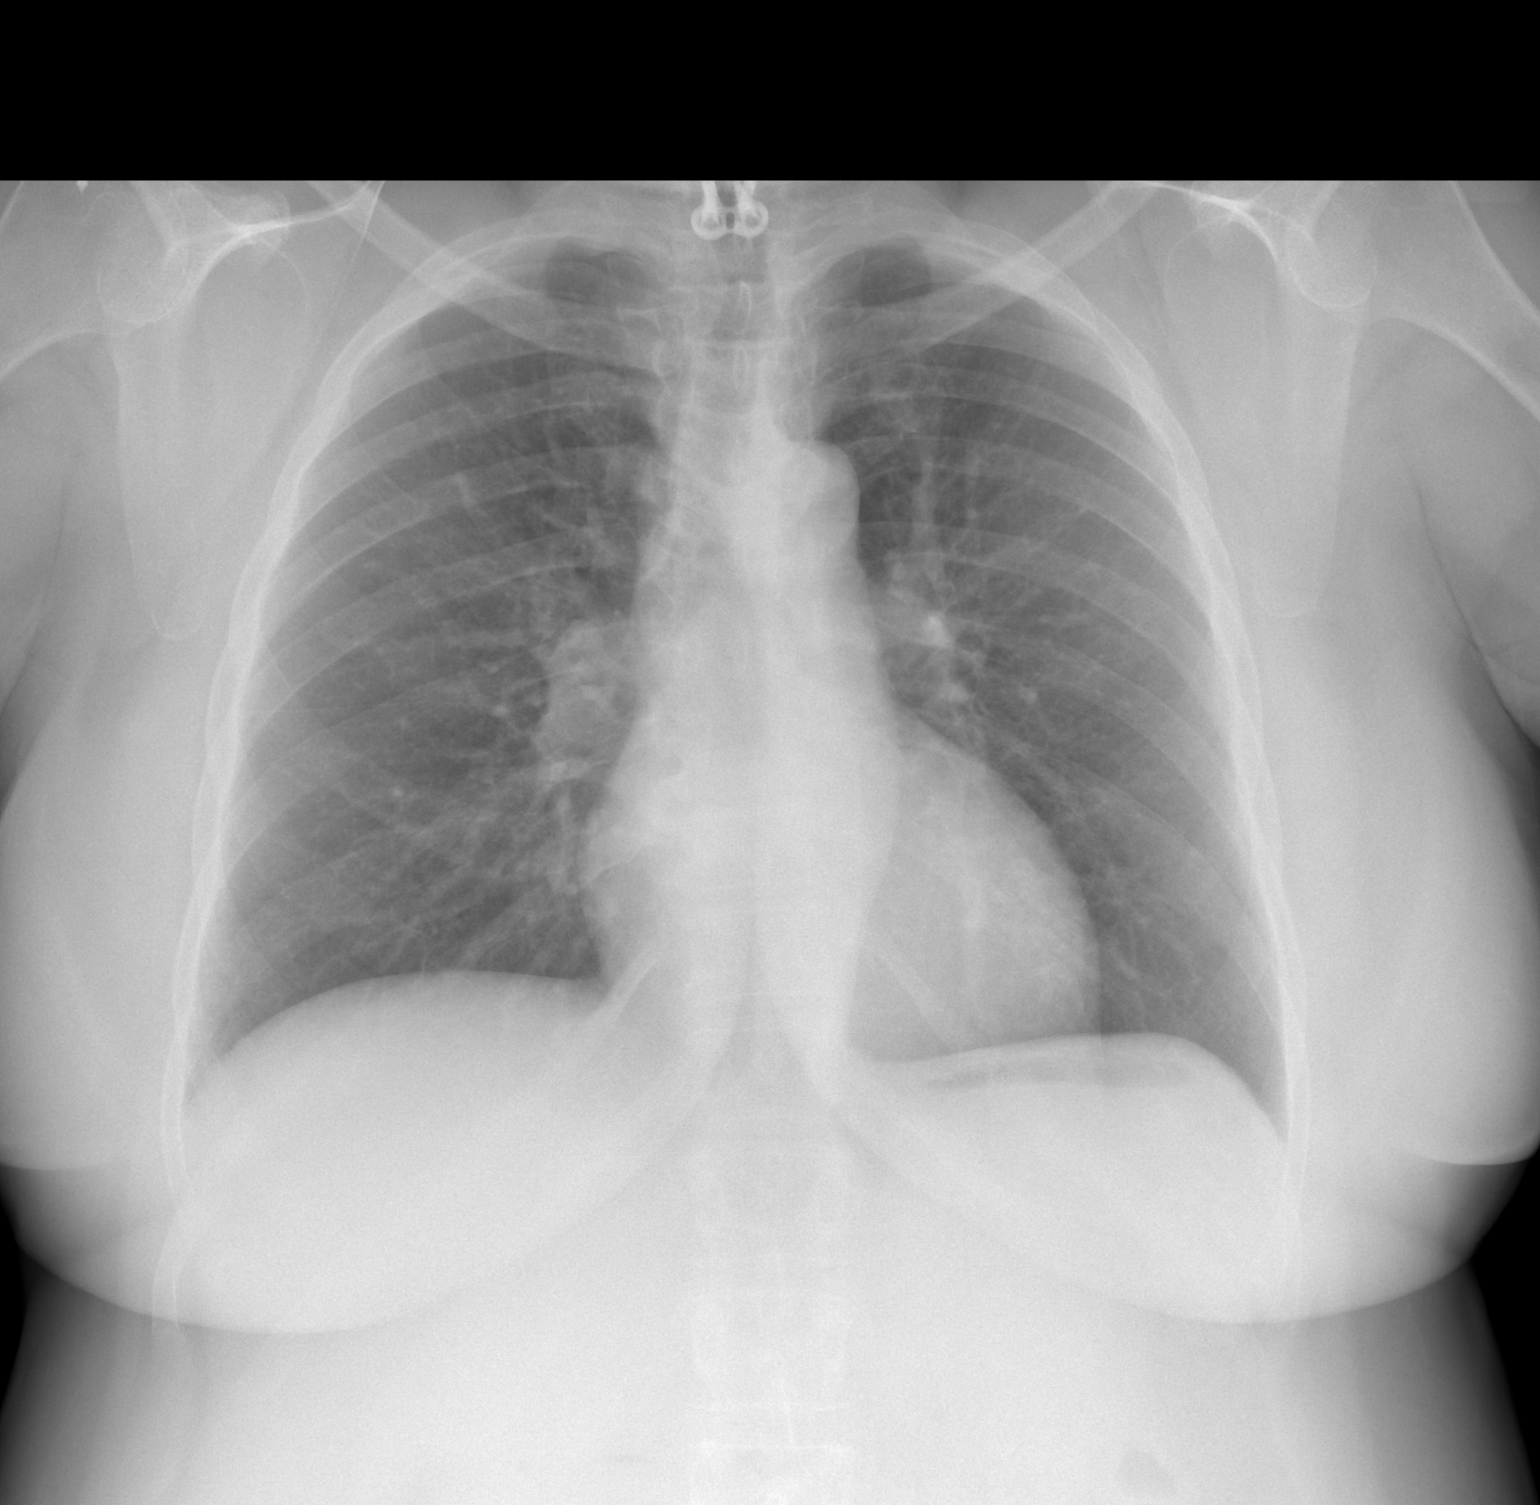

[w chest lat]
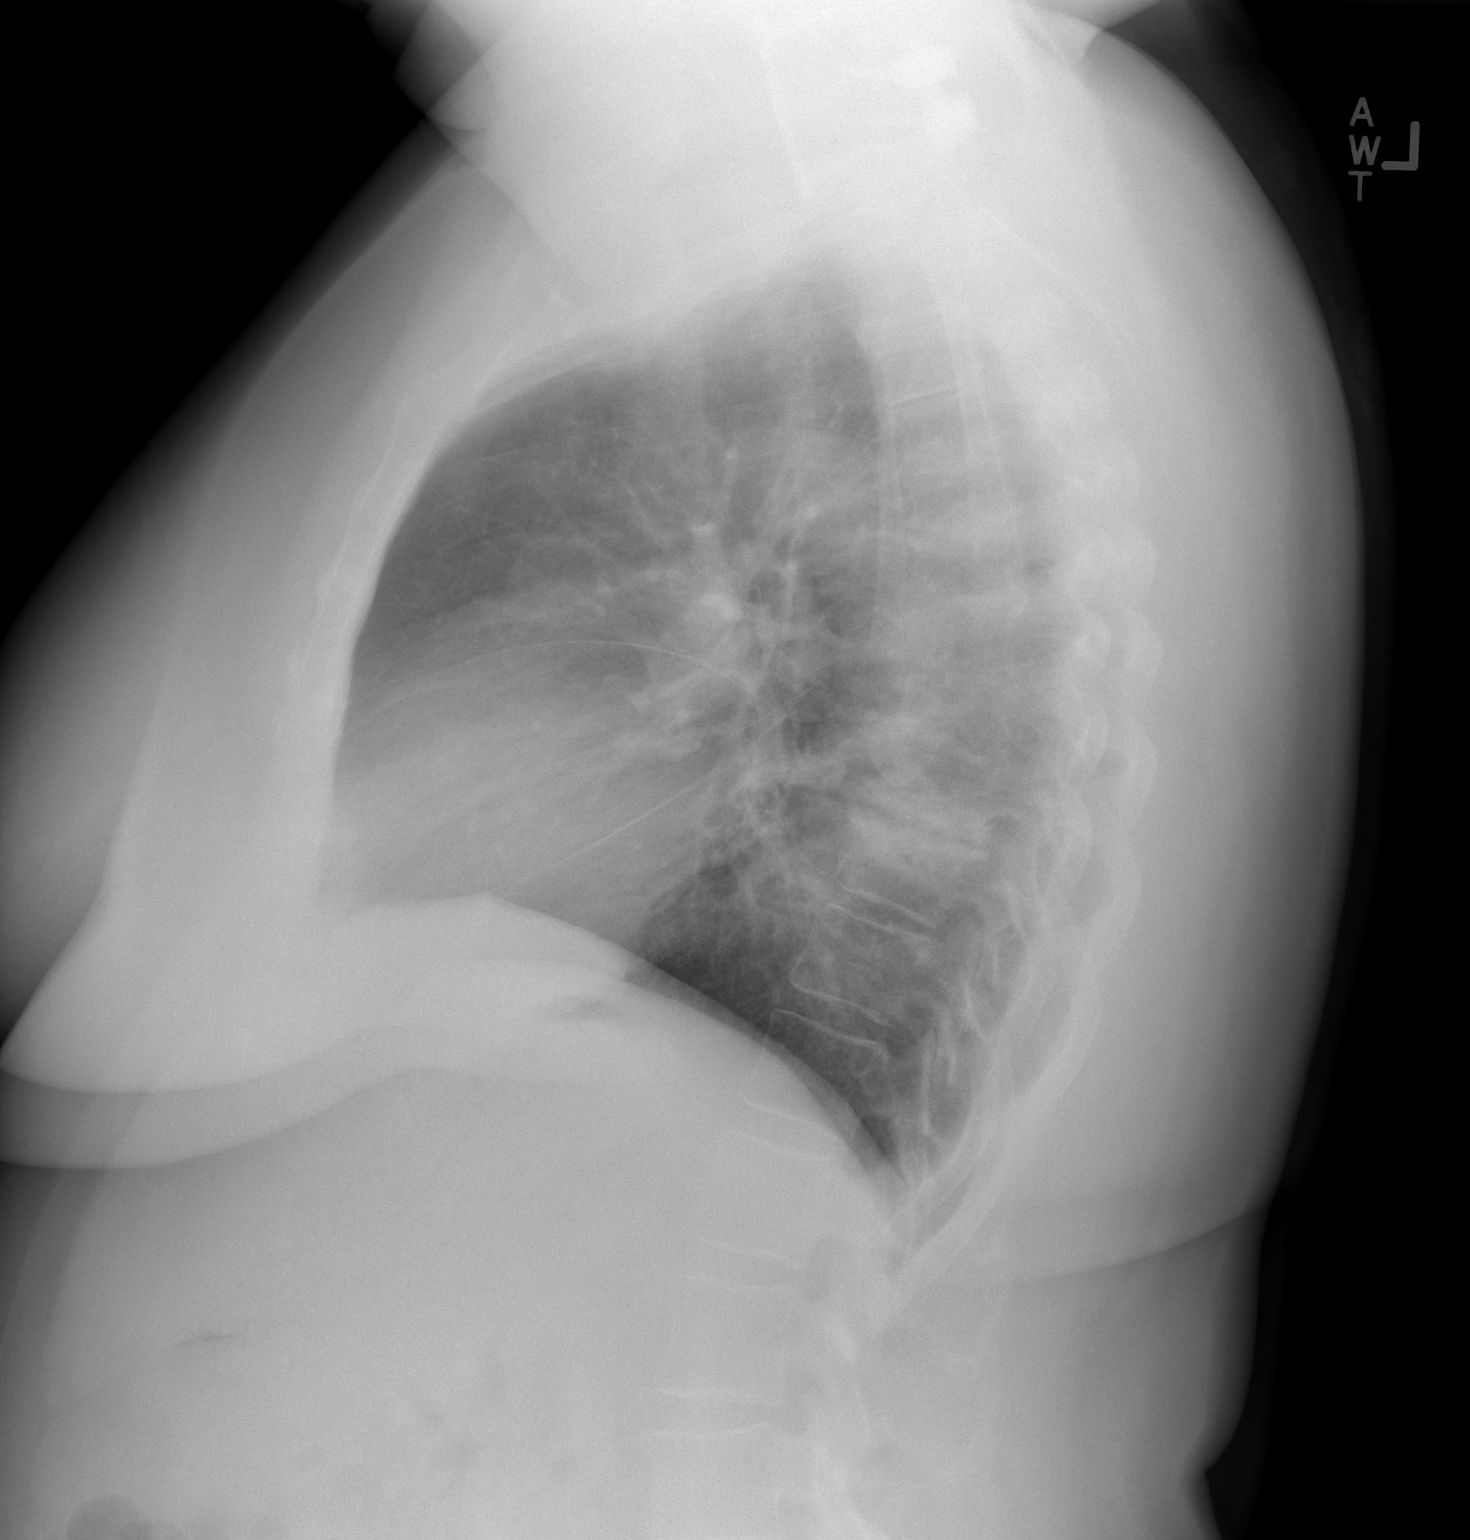

[2 of 2 positions shown; findings below may reference images not displayed]

FINDINGS: Lungs are clear.  Cardiomediastinal silhouette is within
normal.  Fusion hardware is present over the lower cervical
spine/cervical thoracic junction unchanged.  There is moderate
endplate sclerosis over two adjacent lower thoracic vertebrae
unchanged.  Remainder of the exam is unchanged.
IMPRESSION: No acute cardiopulmonary disease.

## 2014-12-06 DIAGNOSIS — E1165 Type 2 diabetes mellitus with hyperglycemia: Secondary | ICD-10-CM | POA: Diagnosis not present

## 2014-12-06 DIAGNOSIS — Z6831 Body mass index (BMI) 31.0-31.9, adult: Secondary | ICD-10-CM | POA: Diagnosis not present

## 2014-12-21 DIAGNOSIS — E1165 Type 2 diabetes mellitus with hyperglycemia: Secondary | ICD-10-CM | POA: Diagnosis not present

## 2014-12-21 DIAGNOSIS — M545 Low back pain: Secondary | ICD-10-CM | POA: Diagnosis not present

## 2014-12-21 DIAGNOSIS — M25511 Pain in right shoulder: Secondary | ICD-10-CM | POA: Diagnosis not present

## 2014-12-26 ENCOUNTER — Other Ambulatory Visit: Payer: Self-pay | Admitting: Family Medicine

## 2014-12-26 ENCOUNTER — Emergency Department (HOSPITAL_COMMUNITY)
Admission: EM | Admit: 2014-12-26 | Discharge: 2014-12-26 | Discharge status: Against Medical Advice | Payer: Medicare Other | Attending: Emergency Medicine | Admitting: Emergency Medicine

## 2014-12-26 ENCOUNTER — Encounter (HOSPITAL_COMMUNITY): Payer: Self-pay | Admitting: Emergency Medicine

## 2014-12-26 DIAGNOSIS — E119 Type 2 diabetes mellitus without complications: Secondary | ICD-10-CM | POA: Diagnosis not present

## 2014-12-26 DIAGNOSIS — J45909 Unspecified asthma, uncomplicated: Secondary | ICD-10-CM | POA: Insufficient documentation

## 2014-12-26 DIAGNOSIS — Z85528 Personal history of other malignant neoplasm of kidney: Secondary | ICD-10-CM | POA: Diagnosis not present

## 2014-12-26 DIAGNOSIS — R339 Retention of urine, unspecified: Secondary | ICD-10-CM | POA: Diagnosis not present

## 2014-12-26 DIAGNOSIS — R109 Unspecified abdominal pain: Secondary | ICD-10-CM | POA: Diagnosis not present

## 2014-12-26 DIAGNOSIS — I10 Essential (primary) hypertension: Secondary | ICD-10-CM | POA: Insufficient documentation

## 2014-12-26 DIAGNOSIS — Z1231 Encounter for screening mammogram for malignant neoplasm of breast: Secondary | ICD-10-CM

## 2014-12-26 LAB — URINALYSIS, ROUTINE W REFLEX MICROSCOPIC
Bilirubin Urine: NEGATIVE
Hgb urine dipstick: NEGATIVE
KETONES UR: NEGATIVE mg/dL
Leukocytes, UA: NEGATIVE
Nitrite: NEGATIVE
Protein, ur: NEGATIVE mg/dL
Specific Gravity, Urine: 1.017 (ref 1.005–1.030)
Urobilinogen, UA: 0.2 mg/dL (ref 0.0–1.0)
pH: 5.5 (ref 5.0–8.0)

## 2014-12-26 LAB — URINE MICROSCOPIC-ADD ON

## 2014-12-26 NOTE — ED Notes (Signed)
Patient c/o right flank pain x2 weeks. History renal cancer, left kidney. Also c/o urinary retention. Rates pain 8/10.

## 2014-12-28 DIAGNOSIS — R39198 Other difficulties with micturition: Secondary | ICD-10-CM | POA: Diagnosis not present

## 2014-12-28 DIAGNOSIS — M545 Low back pain: Secondary | ICD-10-CM | POA: Diagnosis not present

## 2014-12-28 DIAGNOSIS — R81 Glycosuria: Secondary | ICD-10-CM | POA: Diagnosis not present

## 2014-12-28 DIAGNOSIS — N390 Urinary tract infection, site not specified: Secondary | ICD-10-CM | POA: Diagnosis not present

## 2015-01-11 ENCOUNTER — Other Ambulatory Visit: Payer: Self-pay | Admitting: Family Medicine

## 2015-01-11 ENCOUNTER — Ambulatory Visit
Admission: RE | Admit: 2015-01-11 | Discharge: 2015-01-11 | Disposition: A | Discharge status: Home / Self Care | Payer: Medicare Other | Source: Ambulatory Visit | Attending: Family Medicine | Admitting: Family Medicine

## 2015-01-11 DIAGNOSIS — N644 Mastodynia: Secondary | ICD-10-CM

## 2015-01-11 DIAGNOSIS — Z1231 Encounter for screening mammogram for malignant neoplasm of breast: Secondary | ICD-10-CM

## 2015-01-14 ENCOUNTER — Encounter (HOSPITAL_COMMUNITY): Payer: Self-pay | Admitting: Emergency Medicine

## 2015-01-14 ENCOUNTER — Emergency Department (HOSPITAL_COMMUNITY): Payer: Medicare Other

## 2015-01-14 ENCOUNTER — Emergency Department (HOSPITAL_COMMUNITY)
Admission: EM | Admit: 2015-01-14 | Discharge: 2015-01-15 | Disposition: A | Discharge status: Home / Self Care | Payer: Medicare Other | Attending: Emergency Medicine | Admitting: Emergency Medicine

## 2015-01-14 DIAGNOSIS — N2889 Other specified disorders of kidney and ureter: Secondary | ICD-10-CM | POA: Diagnosis not present

## 2015-01-14 DIAGNOSIS — Z9889 Other specified postprocedural states: Secondary | ICD-10-CM | POA: Insufficient documentation

## 2015-01-14 DIAGNOSIS — Z7982 Long term (current) use of aspirin: Secondary | ICD-10-CM | POA: Insufficient documentation

## 2015-01-14 DIAGNOSIS — R109 Unspecified abdominal pain: Secondary | ICD-10-CM | POA: Diagnosis not present

## 2015-01-14 DIAGNOSIS — N990 Postprocedural (acute) (chronic) kidney failure: Secondary | ICD-10-CM | POA: Diagnosis not present

## 2015-01-14 DIAGNOSIS — J45909 Unspecified asthma, uncomplicated: Secondary | ICD-10-CM | POA: Diagnosis not present

## 2015-01-14 DIAGNOSIS — Z79899 Other long term (current) drug therapy: Secondary | ICD-10-CM | POA: Insufficient documentation

## 2015-01-14 DIAGNOSIS — R5383 Other fatigue: Secondary | ICD-10-CM | POA: Insufficient documentation

## 2015-01-14 DIAGNOSIS — E7800 Pure hypercholesterolemia, unspecified: Secondary | ICD-10-CM | POA: Diagnosis not present

## 2015-01-14 DIAGNOSIS — Z8669 Personal history of other diseases of the nervous system and sense organs: Secondary | ICD-10-CM | POA: Insufficient documentation

## 2015-01-14 DIAGNOSIS — R05 Cough: Secondary | ICD-10-CM | POA: Diagnosis not present

## 2015-01-14 DIAGNOSIS — Z85528 Personal history of other malignant neoplasm of kidney: Secondary | ICD-10-CM | POA: Insufficient documentation

## 2015-01-14 DIAGNOSIS — E119 Type 2 diabetes mellitus without complications: Secondary | ICD-10-CM | POA: Diagnosis not present

## 2015-01-14 DIAGNOSIS — R531 Weakness: Secondary | ICD-10-CM | POA: Diagnosis present

## 2015-01-14 DIAGNOSIS — N390 Urinary tract infection, site not specified: Secondary | ICD-10-CM | POA: Insufficient documentation

## 2015-01-14 DIAGNOSIS — R1110 Vomiting, unspecified: Secondary | ICD-10-CM | POA: Diagnosis not present

## 2015-01-14 DIAGNOSIS — R110 Nausea: Secondary | ICD-10-CM | POA: Insufficient documentation

## 2015-01-14 DIAGNOSIS — R350 Frequency of micturition: Secondary | ICD-10-CM | POA: Insufficient documentation

## 2015-01-14 DIAGNOSIS — I10 Essential (primary) hypertension: Secondary | ICD-10-CM | POA: Diagnosis not present

## 2015-01-14 DIAGNOSIS — Z794 Long term (current) use of insulin: Secondary | ICD-10-CM | POA: Diagnosis not present

## 2015-01-14 DIAGNOSIS — K219 Gastro-esophageal reflux disease without esophagitis: Secondary | ICD-10-CM | POA: Insufficient documentation

## 2015-01-14 LAB — CBC
HCT: 40.4 % (ref 36.0–46.0)
Hemoglobin: 13.1 g/dL (ref 12.0–15.0)
MCH: 29.4 pg (ref 26.0–34.0)
MCHC: 32.4 g/dL (ref 30.0–36.0)
MCV: 90.6 fL (ref 78.0–100.0)
PLATELETS: 225 10*3/uL (ref 150–400)
RBC: 4.46 MIL/uL (ref 3.87–5.11)
RDW: 13.4 % (ref 11.5–15.5)
WBC: 4.3 10*3/uL (ref 4.0–10.5)

## 2015-01-14 LAB — URINE MICROSCOPIC-ADD ON: RBC / HPF: NONE SEEN RBC/hpf (ref 0–5)

## 2015-01-14 LAB — LIPASE, BLOOD: Lipase: 40 U/L (ref 11–51)

## 2015-01-14 LAB — BASIC METABOLIC PANEL
Anion gap: 6 (ref 5–15)
BUN: 16 mg/dL (ref 6–20)
CALCIUM: 9.5 mg/dL (ref 8.9–10.3)
CHLORIDE: 106 mmol/L (ref 101–111)
CO2: 25 mmol/L (ref 22–32)
CREATININE: 1.2 mg/dL — AB (ref 0.44–1.00)
GFR calc non Af Amer: 43 mL/min — ABNORMAL LOW (ref >60)
GFR, EST AFRICAN AMERICAN: 50 mL/min — AB (ref >60)
Glucose, Bld: 230 mg/dL — ABNORMAL HIGH (ref 65–99)
Potassium: 4.2 mmol/L (ref 3.5–5.1)
Sodium: 137 mmol/L (ref 135–145)

## 2015-01-14 LAB — URINALYSIS, ROUTINE W REFLEX MICROSCOPIC
Bilirubin Urine: NEGATIVE
GLUCOSE, UA: 500 mg/dL — AB
HGB URINE DIPSTICK: NEGATIVE
KETONES UR: NEGATIVE mg/dL
Nitrite: NEGATIVE
PROTEIN: NEGATIVE mg/dL
Specific Gravity, Urine: 1.019 (ref 1.005–1.030)
pH: 5.5 (ref 5.0–8.0)

## 2015-01-14 MED ORDER — SODIUM CHLORIDE 0.9 % IV BOLUS (SEPSIS)
500.0000 mL | Freq: Once | INTRAVENOUS | Status: AC
  Administered 2015-01-14: 500 mL via INTRAVENOUS

## 2015-01-14 MED ORDER — IOHEXOL 300 MG/ML  SOLN
80.0000 mL | Freq: Once | INTRAMUSCULAR | Status: AC | PRN
  Administered 2015-01-14: 80 mL via INTRAVENOUS

## 2015-01-14 MED ORDER — CEPHALEXIN 500 MG PO CAPS: 500.0000 mg | ORAL_CAPSULE | Freq: Two times a day (BID) | ORAL | Status: DC

## 2015-01-14 MED ORDER — DEXTROSE 5 % IV SOLN
1.0000 g | Freq: Once | INTRAVENOUS | Status: AC
  Administered 2015-01-14: 1 g via INTRAVENOUS
  Filled 2015-01-14: qty 10

## 2015-01-14 MED ORDER — ONDANSETRON 4 MG PO TBDP
4.0000 mg | ORAL_TABLET | Freq: Once | ORAL | Status: AC
  Administered 2015-01-14: 4 mg via ORAL
  Filled 2015-01-14: qty 1

## 2015-01-14 MED ORDER — ONDANSETRON HCL 4 MG/2ML IJ SOLN: 4.0000 mg | Freq: Once | INTRAMUSCULAR | Status: DC

## 2015-01-14 NOTE — ED Provider Notes (Signed)
CSN: [id number]     Arrival date & time 01/14/15  1618 History   First MD Initiated Contact with Patient 01/14/15 1828     Chief Complaint  Patient presents with  . Weakness     (Consider location/radiation/quality/duration/timing/severity/associated sxs/prior Treatment) HPI  Patient is a 74 year old female with past medical history significant for CKD, hypertension, diabetes, OSA, h/o left renal cell carcinoma s/p resection, who presents to the emergency department with a two-month history of generalized fatigue, weakness, nausea, right-sided abdominal pain, weight loss. Patient reports intermittent right-sided abdominal pain. Describes as sharp, nonradiating, intermittent, occurring a couple of times a day. He has not noted anything that significantly improves or worsens the pain. Has not taken anything for the pain. Associated with nausea. Denies fevers, chills, night sweats, diarrhea, melena, dysuria, hematuria. Does report some mild urinary frequency. Reports a 10-20 pound weight loss over the past 2 months. Denies history of kidney stones. Denies sick contacts at home. Denies recent change in medications.  Past Medical History  Diagnosis Date  . Diabetes mellitus   . Hypertension   . Renal failure (ARF), acute on chronic (HCC)   . Hypercholesteremia   . Reflux   . left renal ca dx'd 2012 (?)    surg only  . Asthma   . Sleep apnea    Past Surgical History  Procedure Laterality Date  . Renal mass excision    . Abdominal hysterectomy    . Appendectomy    . Spine surgery    . Cardiac catheterization N/A 07/28/2014    Procedure: Left Heart Cath and Coronary Angiography;  Surgeon: [name], MD;  Location: [facility];  Service: Cardiovascular;  Laterality: N/A;  . Peripheral vascular catheterization N/A 07/28/2014    Procedure: Aortic Arch Angiography;  Surgeon: [name], MD;  Location: [facility];  Service: Cardiovascular;  Laterality: N/A;   Family History   Problem Relation Age of Onset  . Coronary artery disease    . Hypertension Mother    Social History  Substance Use Topics  . Smoking status: Never Smoker   . Smokeless tobacco: Never Used  . Alcohol Use: No   OB History    No data available     Review of Systems  Constitutional: Positive for activity change and unexpected weight change. Negative for fever, diaphoresis and appetite change.  HENT: Negative for congestion and trouble swallowing.   Respiratory: Negative for cough, chest tightness, shortness of breath and wheezing.   Cardiovascular: Negative for chest pain.  Gastrointestinal: Positive for nausea and abdominal pain. Negative for vomiting, diarrhea, constipation and blood in stool.  Genitourinary: Positive for frequency. Negative for dysuria, urgency, hematuria, flank pain and difficulty urinating.  Musculoskeletal: Negative for back pain, gait problem and neck pain.  Skin: Negative for rash.  Neurological: Negative for dizziness, seizures, facial asymmetry, weakness and light-headedness.  Psychiatric/Behavioral: Negative for behavioral problems.      Allergies  Sulfa antibiotics  Home Medications   Prior to Admission medications   Medication Sig Start Date End Date Taking? Authorizing Provider  albuterol (PROVENTIL,VENTOLIN) 90 MCG/ACT inhaler Inhale 1-2 puffs into the lungs every 4 (four) hours as needed for wheezing or shortness of breath.    Yes Historical Provider, MD  amLODipine (NORVASC) 2.5 MG tablet Take 2.5 mg by mouth daily.     Yes Historical Provider, MD  aspirin 81 MG chewable tablet Chew 81 mg by mouth at bedtime.   Yes Historical Provider, MD  atorvastatin (  LIPITOR) 10 MG tablet Take 10 mg by mouth daily.     Yes Historical Provider, MD  chlorpheniramine-HYDROcodone (TUSSIONEX) 10-8 MG/5ML LQCR Take 5 mLs by mouth every 12 (twelve) hours as needed for cough. 01/03/14  Yes Harden Mo, MD  Cholecalciferol (VITAMIN D) 1000 UNITS capsule Take 1,000  Units by mouth daily.     Yes Historical Provider, MD  esomeprazole (NEXIUM) 40 MG capsule Take 40 mg by mouth daily as needed (acid reflux).    Yes Historical Provider, MD  gabapentin (NEURONTIN) 300 MG capsule TAKE 1 CAPSULE (300 MG TOTAL) BY MOUTH AT BEDTIME. 11/14/14  Yes Richard Joelene Millin, DPM  Insulin Degludec (TRESIBA FLEXTOUCH) 200 UNIT/ML SOPN Inject 68 Units into the skin daily before breakfast.   Yes Historical Provider, MD  lisinopril (PRINIVIL,ZESTRIL) 40 MG tablet Take 40 mg by mouth daily.     Yes Historical Provider, MD  metoprolol tartrate (LOPRESSOR) 25 MG tablet Take 25 mg by mouth 2 (two) times daily.   Yes Historical Provider, MD  mirabegron ER (MYRBETRIQ) 25 MG TB24 tablet Take 25 mg by mouth daily. For bladder   Yes Historical Provider, MD  Multiple Vitamin (MULTIVITAMIN WITH MINERALS) TABS tablet Take 1 tablet by mouth daily.   Yes Historical Provider, MD  nitroGLYCERIN (NITROSTAT) 0.4 MG SL tablet Place 0.4 mg under the tongue every 5 (five) minutes as needed for chest pain.   Yes Historical Provider, MD  BD INSULIN SYRINGE ULTRAFINE 31G X 15/64" 1 ML MISC  11/03/13   Historical Provider, MD  cephALEXin (KEFLEX) 500 MG capsule Take 1 capsule (500 mg total) by mouth 2 (two) times daily. 01/14/15   Nathaniel Man, MD  ONE TOUCH ULTRA TEST test strip  09/28/13   Historical Provider, MD  silodosin (RAPAFLO) 4 MG CAPS capsule Take 1 capsule (4 mg total) by mouth daily with breakfast. Patient not taking: Reported on 01/14/2015 08/27/14   Elmyra Ricks Pisciotta, PA-C   BP 131/58 mmHg  Pulse 71  Temp(Src) 98.4 F (36.9 C) (Oral)  Resp 18  Ht 4\' 11"  (1.499 m)  Wt 69.514 kg  BMI 30.94 kg/m2  SpO2 98% Physical Exam  Constitutional: She is oriented to person, place, and time. She appears well-developed and well-nourished.  HENT:  Head: Normocephalic and atraumatic.  Mouth/Throat: Oropharynx is clear and moist.  Eyes: Conjunctivae and EOM are normal. Pupils are equal, round, and reactive  to light.  Neck: Normal range of motion. No JVD present. No tracheal deviation present.  Cardiovascular: Normal rate, regular rhythm, normal heart sounds and intact distal pulses.   Pulmonary/Chest: Effort normal and breath sounds normal. No respiratory distress.  Abdominal: Soft. Bowel sounds are normal. She exhibits no distension. There is tenderness (right sided). There is no rebound and no guarding.  Negative McBurney's point, negative Murphy's sign, negative Rovsing sign, negative psoas.  No CVA tenderness.  Musculoskeletal: Normal range of motion.  Neurological: She is alert and oriented to person, place, and time.  Skin: Skin is warm. No pallor.  Psychiatric: She has a normal mood and affect.  Nursing note and vitals reviewed.   ED Course  Procedures (including critical care time) Labs Review Labs Reviewed  BASIC METABOLIC PANEL - Abnormal; Notable for the following:    Glucose, Bld 230 (*)    Creatinine, Ser 1.20 (*)    GFR calc non Af Amer 43 (*)    GFR calc Af Amer 50 (*)    All other components within normal limits  URINALYSIS,  ROUTINE W REFLEX MICROSCOPIC (NOT AT [facility]) - Abnormal; Notable for the following:    APPearance CLOUDY (*)    Glucose, UA 500 (*)    Leukocytes, UA LARGE (*)    All other components within normal limits  URINE MICROSCOPIC-ADD ON - Abnormal; Notable for the following:    Squamous Epithelial / LPF 0-5 (*)    Bacteria, UA RARE (*)    All other components within normal limits  URINE CULTURE  CBC  LIPASE, BLOOD    Imaging Review Dg Chest 2 View  01/14/2015  CLINICAL DATA:  Cough, fatigue, and dizziness for 2 months. Asthma. Renal failure. EXAM: CHEST  2 VIEW COMPARISON:  01/26/2014 FINDINGS: The heart size and mediastinal contours are within normal limits. Both lungs are clear. No evidence of pneumothorax or pleural effusion. Cervical spine fusion hardware again noted. IMPRESSION: No active cardiopulmonary disease. Electronically Signed   By: Earle Gell M.D.   On: 01/14/2015 19:52   Ct Abdomen Pelvis W Contrast  01/14/2015  CLINICAL DATA:  74 year old female with diffuse abdominal pain, weight loss and intractable vomiting. EXAM: CT ABDOMEN AND PELVIS WITH CONTRAST TECHNIQUE: Multidetector CT imaging of the abdomen and pelvis was performed using the standard protocol following bolus administration of intravenous contrast. CONTRAST:  31mL OMNIPAQUE IOHEXOL 300 MG/ML  SOLN COMPARISON:  CT the abdomen and pelvis 07/06/2013. FINDINGS: Lower chest:  Unremarkable. Hepatobiliary: No cystic or solid hepatic lesions. No intra or extrahepatic biliary ductal dilatation. Gallbladder is nearly completely decompressed, but otherwise unremarkable in appearance. Pancreas: No pancreatic mass. No pancreatic ductal dilatation. No pancreatic or peripancreatic fluid or inflammatory changes. Spleen: Unremarkable. Adrenals/Urinary Tract: Postoperative changes of partial nephrectomy are noted in the posterior interpolar region of the left kidney. No evidence of recurrent mass in this region. However, in the peripelvic aspect of the interpolar region of the right kidney (axial image 27 of series 201 and coronal image 69 of series 203) there is an enlarging 1.9 x 1.6 x 2.3 cm enhancing lesion (128 HU on portal venous phase decreasing to 84 HU on delayed phase), highly concerning for renal neoplasm. This lesion appears completely separate from the right renal vein. No hydroureteronephrosis. Urinary bladder is normal in appearance. Bilateral adrenal glands are normal in appearance. Stomach/Bowel: The appearance of the stomach is normal. No pathologic dilatation of small bowel or colon. The appendix is not confidently identified and is likely surgically absent. Regardless, no inflammatory changes are noted adjacent to the cecum to suggest presence of an acute appendicitis at this time. Vascular/Lymphatic: Minimal atherosclerotic disease noted throughout the abdominal and pelvic  vasculature, without evidence of aneurysm or dissection. No lymphadenopathy noted in the abdomen or pelvis. Reproductive: Status post hysterectomy.  Ovaries are atrophic. Other: No significant volume of ascites.  No pneumoperitoneum. Musculoskeletal: There are no aggressive appearing lytic or blastic lesions noted in the visualized portions of the skeleton. Advanced discogenic changes involving the superior aspect of the T10 vertebral body, similar to prior examinations. IMPRESSION: 1. No acute findings in the abdomen or pelvis to account for the patient's symptoms. 2. However, there is an enlarging enhancing lesion in the peripelvic aspect of the interpolar region of the right kidney currently measuring 1.9 x 1.6 x 2.3 cm, highly concerning for renal neoplasm. Nonemergent outpatient referral to Urology for further evaluation is strongly recommended at this time. These results were called by telephone at the time of interpretation on 01/14/2015 at 10:25 pm to Dr. Nathaniel Man, who verbally acknowledged these  results. Electronically Signed   By: Vinnie Langton M.D.   On: 01/14/2015 22:25   I have personally reviewed and evaluated these images and lab results as part of my medical decision-making.   EKG Interpretation None      MDM   Final diagnoses:  UTI (lower urinary tract infection)  Right kidney mass   Patient is a 74 year old female with past medical history significant for CKD, hypertension, diabetes, OSA, h/o left renal cell carcinoma s/p resection, who presents to the emergency department with a two-month history of generalized fatigue, weakness, nausea, right-sided abdominal pain, weight loss.  On arrival no acute distress, not ill appearing. Afebrile, hemodynamically stable. Exam as above, notable for soft, diffuse right-sided abdominal tenderness to palpation, no rebound or signs of peritonitis, no CVA tenderness.  Basic labs, UA obtained. Given her clinical picture with ongoing  symptoms for 2 months and weight loss, will obtain a CT abdomen and pelvis to evaluate for Malignancy.   No leukocytosis, WBC 4.3. Hemoglobin stable at 13.1. No significant electrolyte abnormalities. Cr 1.2 (mildly elevated from baseline 1.1), GFR 50. UA showed signs of urinary tract infection. Given the patient's two-month history of symptoms, no fevers, no emesis, no CVA tenderness, doubt pyelonephritis. Will treat as a urinary tract infection.  Given 500 cc bolus NS prior to CT A/P d/t GFR 50.  CT showed no acute findings to explain the patient's abdominal pain. Did show a new mass of the right kidney concerning for malignancy. Discussed the patient's case with Dr.Budzyn (urology on call), urology will follow up closely as an outpatient for ongoing workup and management for right kidney mass.  Patient stable for discharge home. Given a prescription for Keflex for urinary tract infection. Discussed CT findings with the patient, discussed concerns for malignancy of the right kidney. Patient reports that she has a urologist who followed her for the left renal cell carcinoma. States that she will call him on Monday for follow-up. Discussed with the patient that if she is unable to get close follow-up that we have talked with the urologist on-call who will follow her closely. Discussed follow up with PCP in the next week to reevaluate the patient's symptoms. Patient expressed understanding. No questions or concerns at time of discharge. Patient discharged home in stable condition.   Nathaniel Man, MD 01/15/15 [id number]  Forde Dandy, MD 01/15/15 [phone]

## 2015-01-14 NOTE — Discharge Instructions (Signed)
You were seen in the emergency department and diagnosed with the urinary tract infection. Take Keflex 500 mg twice a day for 5 days. CAT scan of your abdomen showed a right renal mass, concerning for possible renal cell carcinoma. Follow-up with your urologist, call to schedule an appointment on Monday for outpatient workup of the right kidney mass.

## 2015-01-14 NOTE — ED Notes (Signed)
Reviewed d/c instructions with pt, who voiced understanding. Pt departed in a wheelchair, escorted by RN, and in NAD.

## 2015-01-14 NOTE — ED Notes (Signed)
Patient transported to CT 

## 2015-01-14 NOTE — ED Notes (Signed)
Pt c/o generalized weakness ongoing for a couple of weeks. Pt reports that she had her insulin changed 2 months ago.

## 2015-01-15 ENCOUNTER — Telehealth: Payer: Self-pay | Admitting: *Deleted

## 2015-01-15 NOTE — Telephone Encounter (Signed)
Called Rx to [facility] (against policy SINCE THIS IS 74 y.o. Cancer pt ) Keflex 500 mg po BID x 5 d. #10 No Refills.

## 2015-01-15 NOTE — ED Provider Notes (Signed)
I saw and evaluated the patient, reviewed the resident's note and I agree with the findings and plan.   74 year old female with history of left renal cell carcinoma status post resection, CK D, hypertension, and diabetes who presents to emergency department with 2 month history of generalized weakness, weight loss, and intermittent right-sided abdominal pain. Associated with nausea but no vomiting, diarrhea, melena or hematochezia. This had some mild urinary frequency recently but no fevers, chills, or night sweats. She is hemodynamically stable and afebrile on arrival. Has a soft and nonsurgical abdomen, with primarily right hemi-abdominal tenderness to palpation. Has evidence of a UTI on urinalysis, but unlikely to be pyelonephritis given that her abdominal pain has been ongoing for 2 months. Will be treated with a course of Keflex. Given her age and symptoms concerning for malignancy as CT abdomen and pelvis was also performed. Evidence of a new mass over her right kidney that is concerning for malignancy.  Dr. Sharin Grave from urology is made aware, and will follow up closely with her as an outpatient for ongoing work-up and management. Appropriate for discharge home. Strict return and follow-up instructions reviewed. She expressed understanding of all discharge instructions and felt comfortable with the plan of care.   Forde Dandy, MD 01/15/15 [phone]

## 2015-01-16 LAB — URINE CULTURE

## 2015-01-17 DIAGNOSIS — E1165 Type 2 diabetes mellitus with hyperglycemia: Secondary | ICD-10-CM | POA: Diagnosis not present

## 2015-01-17 DIAGNOSIS — N2889 Other specified disorders of kidney and ureter: Secondary | ICD-10-CM | POA: Diagnosis not present

## 2015-01-18 ENCOUNTER — Other Ambulatory Visit (HOSPITAL_COMMUNITY): Payer: Self-pay | Admitting: Urology

## 2015-01-18 DIAGNOSIS — C649 Malignant neoplasm of unspecified kidney, except renal pelvis: Secondary | ICD-10-CM

## 2015-01-20 DIAGNOSIS — D49511 Neoplasm of unspecified behavior of right kidney: Secondary | ICD-10-CM | POA: Diagnosis not present

## 2015-01-20 DIAGNOSIS — C642 Malignant neoplasm of left kidney, except renal pelvis: Secondary | ICD-10-CM | POA: Diagnosis not present

## 2015-01-30 ENCOUNTER — Ambulatory Visit (HOSPITAL_COMMUNITY)
Admission: RE | Admit: 2015-01-30 | Discharge: 2015-01-30 | Disposition: A | Discharge status: Home / Self Care | Payer: Medicare Other | Source: Ambulatory Visit | Attending: Urology | Admitting: Urology

## 2015-01-30 ENCOUNTER — Other Ambulatory Visit (HOSPITAL_COMMUNITY): Payer: Self-pay | Admitting: Urology

## 2015-01-30 DIAGNOSIS — C649 Malignant neoplasm of unspecified kidney, except renal pelvis: Secondary | ICD-10-CM

## 2015-01-30 DIAGNOSIS — D1803 Hemangioma of intra-abdominal structures: Secondary | ICD-10-CM | POA: Diagnosis not present

## 2015-01-30 DIAGNOSIS — N2889 Other specified disorders of kidney and ureter: Secondary | ICD-10-CM | POA: Diagnosis not present

## 2015-01-30 MED ORDER — GADOBENATE DIMEGLUMINE 529 MG/ML IV SOLN: 15.0000 mL | Freq: Once | INTRAVENOUS | Status: DC | PRN

## 2015-01-31 ENCOUNTER — Other Ambulatory Visit: Payer: Self-pay | Admitting: Urology

## 2015-02-04 NOTE — Progress Notes (Addendum)
01-04-15 - CXR - EPIC 01-14-15 - UA - EPIC 01-14-15 - CBC - EPIC 08-04-14 - LOV - B. Ebony Hail, AGNP-C (cardio) - in chart 08-04-14 - Cardiac cath - EPIC 07-28-14 - EKG - EPIC 07-14-14 - ECHO - in chart 07-08-14 - Stress Test - in chart 06-30-14 - EKG - in chart

## 2015-02-04 NOTE — Patient Instructions (Addendum)
TALYSSA GARBERS  02/04/2015   Your procedure is scheduled on: 02-09-15  Report to [facility]  Entrance take [facility]  elevators to 3rd floor to  [place] at  9:30 AM.  Call this number if you have problems the morning of surgery [phone]   Remember: ONLY 1 PERSON MAY GO WITH YOU TO SHORT STAY TO GET  READY MORNING OF [place].  Do not eat food or drink liquids :After Midnight.     Take these medicines the morning of surgery with A SIP OF WATER: Amlodipine, Nexium, Metoprolol, Albuterol if needed and bring. DO NOT TAKE ANY DIABETIC MEDICATIONS DAY OF YOUR SURGERY                               You may not have any metal on your body including hair pins and              piercings  Do not wear jewelry, make-up, lotions, powders or perfumes, deodorant             Do not wear nail polish.  Do not shave  48 hours prior to surgery.              Do not bring valuables to the hospital. [place].  Contacts, dentures or bridgework may not be worn into surgery.      Patients discharged the day of surgery will not be allowed to drive home.  Name and phone number of your driver:  Special Instructions: coughing and deep breathing exercises, leg exercises              Please read over the following fact sheets you were given: _____________________________________________________________________             [facility] - Preparing for Surgery Before surgery, you can play an important role.  Because skin is not sterile, your skin needs to be as free of germs as possible.  You can reduce the number of germs on your skin by washing with CHG (chlorahexidine gluconate) soap before surgery.  CHG is an antiseptic cleaner which kills germs and bonds with the skin to continue killing germs even after washing. Please DO NOT use if you have an allergy to CHG or antibacterial soaps.  If your skin becomes  reddened/irritated stop using the CHG and inform your nurse when you arrive at Short Stay. Do not shave (including legs and underarms) for at least 48 hours prior to the first CHG shower.  You may shave your face/neck. Please follow these instructions carefully:  1.  Shower with CHG Soap the night before surgery and the  morning of Surgery.  2.  If you choose to wash your hair, wash your hair first as usual with your  normal  shampoo.  3.  After you shampoo, rinse your hair and body thoroughly to remove the  shampoo.                           4.  Use CHG as you would any other liquid soap.  You can apply chg directly  to the skin and wash  Gently with a scrungie or clean washcloth.  5.  Apply the CHG Soap to your body ONLY FROM THE NECK DOWN.   Do not use on face/ open                           Wound or open sores. Avoid contact with eyes, ears mouth and genitals (private parts).                       Wash face,  Genitals (private parts) with your normal soap.             6.  Wash thoroughly, paying special attention to the area where your surgery  will be performed.  7.  Thoroughly rinse your body with warm water from the neck down.  8.  DO NOT shower/wash with your normal soap after using and rinsing off  the CHG Soap.                9.  Pat yourself dry with a clean towel.            10.  Wear clean pajamas.            11.  Place clean sheets on your bed the night of your first shower and do not  sleep with pets. Day of Surgery : Do not apply any lotions/deodorants the morning of surgery.  Please wear clean clothes to the hospital/surgery center.  FAILURE TO FOLLOW THESE INSTRUCTIONS MAY RESULT IN THE CANCELLATION OF YOUR SURGERY PATIENT SIGNATURE_________________________________  NURSE SIGNATURE__________________________________  ________________________________________________________________________

## 2015-02-06 ENCOUNTER — Encounter (HOSPITAL_COMMUNITY): Payer: Self-pay

## 2015-02-06 ENCOUNTER — Encounter (HOSPITAL_COMMUNITY)
Admission: RE | Admit: 2015-02-06 | Discharge: 2015-02-06 | Disposition: A | Discharge status: Home / Self Care | Payer: Medicare Other | Source: Ambulatory Visit | Attending: Urology | Admitting: Urology

## 2015-02-06 DIAGNOSIS — E109 Type 1 diabetes mellitus without complications: Secondary | ICD-10-CM | POA: Diagnosis not present

## 2015-02-06 DIAGNOSIS — R109 Unspecified abdominal pain: Secondary | ICD-10-CM | POA: Diagnosis not present

## 2015-02-06 DIAGNOSIS — E669 Obesity, unspecified: Secondary | ICD-10-CM | POA: Diagnosis not present

## 2015-02-06 DIAGNOSIS — Z6830 Body mass index (BMI) 30.0-30.9, adult: Secondary | ICD-10-CM | POA: Diagnosis not present

## 2015-02-06 DIAGNOSIS — N2889 Other specified disorders of kidney and ureter: Secondary | ICD-10-CM | POA: Diagnosis not present

## 2015-02-06 DIAGNOSIS — K219 Gastro-esophageal reflux disease without esophagitis: Secondary | ICD-10-CM | POA: Diagnosis not present

## 2015-02-06 DIAGNOSIS — Z7982 Long term (current) use of aspirin: Secondary | ICD-10-CM | POA: Diagnosis not present

## 2015-02-06 DIAGNOSIS — N3941 Urge incontinence: Secondary | ICD-10-CM | POA: Diagnosis not present

## 2015-02-06 DIAGNOSIS — Z9049 Acquired absence of other specified parts of digestive tract: Secondary | ICD-10-CM | POA: Diagnosis not present

## 2015-02-06 DIAGNOSIS — Z79899 Other long term (current) drug therapy: Secondary | ICD-10-CM | POA: Diagnosis not present

## 2015-02-06 DIAGNOSIS — Z905 Acquired absence of kidney: Secondary | ICD-10-CM | POA: Diagnosis not present

## 2015-02-06 DIAGNOSIS — Z90710 Acquired absence of both cervix and uterus: Secondary | ICD-10-CM | POA: Diagnosis not present

## 2015-02-06 DIAGNOSIS — Z794 Long term (current) use of insulin: Secondary | ICD-10-CM | POA: Diagnosis not present

## 2015-02-06 DIAGNOSIS — J45909 Unspecified asthma, uncomplicated: Secondary | ICD-10-CM | POA: Diagnosis not present

## 2015-02-06 DIAGNOSIS — E7800 Pure hypercholesterolemia, unspecified: Secondary | ICD-10-CM | POA: Diagnosis not present

## 2015-02-06 DIAGNOSIS — M1990 Unspecified osteoarthritis, unspecified site: Secondary | ICD-10-CM | POA: Diagnosis not present

## 2015-02-06 DIAGNOSIS — I10 Essential (primary) hypertension: Secondary | ICD-10-CM | POA: Diagnosis not present

## 2015-02-06 DIAGNOSIS — G4730 Sleep apnea, unspecified: Secondary | ICD-10-CM | POA: Diagnosis not present

## 2015-02-06 DIAGNOSIS — Z85528 Personal history of other malignant neoplasm of kidney: Secondary | ICD-10-CM | POA: Diagnosis not present

## 2015-02-06 HISTORY — DX: Gastro-esophageal reflux disease without esophagitis: K21.9

## 2015-02-06 HISTORY — DX: Reserved for inherently not codable concepts without codable children: IMO0001

## 2015-02-06 HISTORY — DX: Angina pectoris, unspecified: I20.9

## 2015-02-06 HISTORY — DX: Cardiac arrhythmia, unspecified: I49.9

## 2015-02-06 LAB — BASIC METABOLIC PANEL
ANION GAP: 9 (ref 5–15)
BUN: 19 mg/dL (ref 6–20)
CALCIUM: 9.7 mg/dL (ref 8.9–10.3)
CO2: 25 mmol/L (ref 22–32)
CREATININE: 1.06 mg/dL — AB (ref 0.44–1.00)
Chloride: 110 mmol/L (ref 101–111)
GFR calc Af Amer: 58 mL/min — ABNORMAL LOW (ref >60)
GFR, EST NON AFRICAN AMERICAN: 50 mL/min — AB (ref >60)
GLUCOSE: 130 mg/dL — AB (ref 65–99)
Potassium: 4.2 mmol/L (ref 3.5–5.1)
Sodium: 144 mmol/L (ref 135–145)

## 2015-02-07 LAB — HEMOGLOBIN A1C
HEMOGLOBIN A1C: 10.2 % — AB (ref 4.8–5.6)
Mean Plasma Glucose: 246 mg/dL

## 2015-02-07 NOTE — Progress Notes (Signed)
02-07-15 1250 HgbA1C level noted in Epic- please review.

## 2015-02-08 NOTE — H&P (Signed)
History of Present Illness Ms. Cristofaro is a 74 year old with the following urologic history:    1) Renal cell carcinoma: She is s/p a left robotic assisted laparoscopic partial nephrectomy on October 06, 2008. Her tumor was incidentally detected on a CT scan performed for lower abdominal pain.     TNM stage: pT1a Nx Mx  Histology: Chromophobe  Fuhrman grade: III/IV  Surgical margins: Negative    2) LUTS/incontinence: She has a history of urinary urgency and frequency with urge incontinence and hesitancy.    Interval history:    [name] is seen today after recently having presented to the emergency department and having undergone a CT scan of the abdomen and pelvis with contrast. She was noted to incidentally have a hyperdense centrally located possible right renal mass.    Speaking with her today, she describes a 2 month history of right-sided flank and back pain with radiation to her right lower quadrant. This has been associated with intermittent nausea and an 8 pound weight loss over the past month. She states that her symptoms of first were intermittent but now are persistent. Her symptoms worsened last week and she presented to the emergency department for this reason. She was placed on Keflex for a possible UTI although independent review of her urinalysis indicates no evidence of clear infection. Her urine culture did not provide a specific bacteria and appeared consistent with contamination. She did complete her antibiotic course with no change in her symptoms. She was denied any gross hematuria.    She also has persisted in having urinary symptoms including urgency, frequency, and urge incontinence. She is currently taking Myrbetriq 25 mg with some benefit and decrease in her urinary frequency. However, she still will have occasional urgency and urge incontinence that is bothersome to her. This is particularly a problem if she waits too long to void. Her symptoms are  relatively well managed with timed voiding.   Past Medical History Problems  1. History of Apnea 2. History of Asthma (J45.909) 3. History of diabetes mellitus (Z86.39) 4. History of heartburn (Z87.898) 5. History of hypercholesterolemia (Z86.39) 6. History of hypertension (Z86.79)  Surgical History Problems  1. History of Appendectomy 2. History of Back Surgery 3. History of Cystoscopy For Urethral Stricture 4. History of Hysterectomy 5. History of Kidney Surgery Laparoscopic Partial Nephrectomy  Current Meds 1. AmLODIPine Besylate 2.5 MG Oral Tablet;  Therapy: 25Apr2012 to Recorded 2. Aspirin 81 MG TABS;  Therapy: (Recorded:26Jul2010) to Recorded 3. Atorvastatin Calcium 10 MG Oral Tablet;  Therapy: (Recorded:28Jun2016) to Recorded 4. Gabapentin 300 MG Oral Capsule;  Therapy: (Recorded:09Dec2015) to Recorded 5. Lisinopril 40 MG Oral Tablet;  Therapy: (Recorded:12Sep2012) to Recorded 6. Metoprolol Tartrate 25 MG Oral Tablet;  Therapy: (Recorded:09Dec2015) to Recorded 7. Multi-Day Vitamins TABS;  Therapy: (Recorded:19Jul2016) to Recorded 8. Myrbetriq 25 MG Oral Tablet Extended Release 24 Hour;  Therapy: (Recorded:02Dec2016) to Recorded 9. Nitrostat 0.4 MG Sublingual Tablet Sublingual;  Therapy: (Recorded:26Jul2010) to Recorded 10. Tyler Aas FlexTouch 200 UNIT/ML Subcutaneous Solution Pen-injector;   Therapy: (Recorded:09Nov2016) to Recorded 11. Ventolin AERS;   Therapy: (Recorded:11Sep2012) to Recorded 12. Vitamin D-3 TABS;   Therapy: (Recorded:19Jul2016) to Recorded  Allergies Medication  1. Cephalexin CAPS 2. Sulfa Drugs  Family History Problems  1. Family history of Hypertension 2. Denied: Family history of Kidney Cancer 3. Family history of Transient Ischemic Attack : Mother  Social History Problems  1. Denied: History of Alcohol Use (History) 2. Marital History - Single 3. Never A Smoker 4.  Denied: History of Tobacco Use  Review of  Systems  Genitourinary: no hematuria.  Gastrointestinal: nausea, but no vomiting.  Constitutional: recent 8 lb weight loss, but no fever and no night sweats.  Hematologic/Lymphatic: no swollen glands.  Cardiovascular: no chest pain and no leg swelling.  Respiratory: no shortness of breath and no cough.  Musculoskeletal: back pain.    Vitals Vital Signs [Data Includes: Last 1 Day]  Recorded: 02Dec2016 10:57AM  Height: 4 ft 11 in Weight: 153 lb  BMI Calculated: 30.9 BSA Calculated: 1.65 Blood Pressure: 144 / 73 Heart Rate: 81  Physical Exam Constitutional: Well nourished and well developed . No acute distress.  ENT:. The ears and nose are normal in appearance.  Neck: The appearance of the neck is normal and no neck mass is present.  Pulmonary: No respiratory distress, normal respiratory rhythm and effort and clear bilateral breath sounds.  Cardiovascular: Heart rate and rhythm are normal . No peripheral edema.  Abdomen: Incision site(s) well healed. The abdomen is soft and nontender. No masses are palpated. The abdomen is no rebound and no guarding. Moderate tenderness in the RLQ is present. moderate right CVA tenderness. No hernias are palpable. No hepatosplenomegaly noted.  Lymphatics: The supraclavicular, femoral and inguinal nodes are not enlarged or tender.  Skin: Normal skin turgor, no visible rash and no visible skin lesions.  Neuro/Psych:. Mood and affect are appropriate.    Results/Data Urine [Data Includes: Last 1 Day]   [id number]  COLOR YELLOW   APPEARANCE CLEAR   SPECIFIC GRAVITY 1.025   pH 5.5   GLUCOSE NEGATIVE   BILIRUBIN NEGATIVE   KETONE NEGATIVE   BLOOD TRACE   PROTEIN NEGATIVE   NITRITE NEGATIVE   LEUKOCYTE ESTERASE TRACE   SQUAMOUS EPITHELIAL/HPF 10-20 HPF  WBC 0-5 WBC/HPF  RBC 0-2 RBC/HPF  BACTERIA FEW HPF  CRYSTALS NONE SEEN HPF  CASTS NONE SEEN LPF  Yeast NONE SEEN HPF   I have independently reviewed her recent hospital notes from her  emergency room visit. I also independently reviewed her CT scan of the abdomen and pelvis from 01/14/15. This was a CT scan with contrast with no precontrast imaging. This does demonstrate a concerning centrally located hyperdense mass that is completely endophytic. This is certainly suspicious for a possible renal neoplasm. There appears to be de enhancement on delayed imaging suggesting the possibility of vascular flow. I compare this to her prior imaging including her ultrasound approximately one month ago. Her ultrasound did demonstrate what appear to be a hypoechoic mass in the central right kidney without evidence of blood flow. I also looked at her prior CT scans including a CT scan from the spring of 2015 which did not demonstrate any clear evidence of a right renal mass and her last surveillance CT scan for her contralateral renal cell carcinoma which was performed in 2013 which also demonstrated no concerning right renal masses.   Assessment Assessed  1. Neoplasm of right kidney (D49.511) 2. Renal cell carcinoma, left (C64.2)  Plan Health Maintenance  1. UA With REFLEX; [Do Not Release]; Status:Complete;   DoneSO:[id number] 10:43AM Neoplasm of right kidney  2. Follow-up Office  Follow-up  Status: Hold For - Appointment,Date of Service  Requested  for: 02Dec2016  Discussion/Summary 1. Right renal mass: Her renal mass does raise suspicion for a possible new localized malignancy. I recommended that she undergo further definitive imaging with an MRI of the abdomen with and without IV contrast for more definitive evaluation. She did have  recent chest imaging including a chest x-ray that was negative for any evidence of metastatic disease or pulmonary nodules. She understands that if this mass is suspicious, she likely would require further treatment and we discussed the option of proceeding with a renal mass biopsy although she may ultimately require surgical resection. Unfortunately, with the  central location of this mass, she would require a total nephrectomy that could be performed laparoscopically. This would certainly place her at increased risk for development of worsening chronic kidney disease although I think her risk of dialysis-dependent end-stage renal disease would still be relatively low considering the preserved parenchyma of her left kidney following her partial nephrectomy on that side.    I also explained to her that I cannot explain her symptoms to be related to her renal mass.    2. Urge incontinence: She was provided a sample for Myrbetriq 50 mg to see if this may provide her additional benefit above 25 mg dose she is currently taking.    3. Right flank/abdominal pain: I do not find any urologic cause for her pain symptoms based on her evaluation today or recent imaging. Her CT scan does suggest some fullness of the colon and I did recommend empiric treatment for constipation. She will try MiraLAX to see if this might have any effect on her symptoms. Otherwise, I recommended that she follow-up with her primary care physician, Dr. Berdine Addison, to continue to try to determine the potential etiology of her pain.    Cc: Dr. [name]     Amendment  I spoke with Mrs. Burnsworth tonight and reviewed her MRI results. Considering the centrally located mass that appears to be clearly enhancing, this raises concern for malignancy of either the renal parenchyma or renal pelvic mucosa. I recommended that we proceed with cystoscopy, right retrograde pyelography, right ureteroscopy and possible biopsy, and right ureteral stent. We reviewed the potential risks and complications of this procedure as well as the expected recovery process. She gives her informed consent.1     1 Amended By: Raynelle Bring; Jan 30 2015 5:28 PM EST  Signatures Electronically signed by : Raynelle Bring, M.D.; Jan 30 2015  5:37PM EST

## 2015-02-09 ENCOUNTER — Encounter (HOSPITAL_COMMUNITY): Payer: Self-pay | Admitting: *Deleted

## 2015-02-09 ENCOUNTER — Ambulatory Visit (HOSPITAL_COMMUNITY): Payer: Medicare Other | Admitting: Anesthesiology

## 2015-02-09 ENCOUNTER — Encounter (HOSPITAL_COMMUNITY)
Admission: RE | Disposition: A | Discharge status: Home / Self Care | Payer: Self-pay | Source: Ambulatory Visit | Attending: Urology

## 2015-02-09 ENCOUNTER — Ambulatory Visit (HOSPITAL_COMMUNITY)
Admission: RE | Admit: 2015-02-09 | Discharge: 2015-02-09 | Disposition: A | Discharge status: Home / Self Care | Payer: Medicare Other | Source: Ambulatory Visit | Attending: Urology | Admitting: Urology

## 2015-02-09 DIAGNOSIS — E7800 Pure hypercholesterolemia, unspecified: Secondary | ICD-10-CM | POA: Insufficient documentation

## 2015-02-09 DIAGNOSIS — Z905 Acquired absence of kidney: Secondary | ICD-10-CM | POA: Insufficient documentation

## 2015-02-09 DIAGNOSIS — R109 Unspecified abdominal pain: Secondary | ICD-10-CM | POA: Insufficient documentation

## 2015-02-09 DIAGNOSIS — Z9049 Acquired absence of other specified parts of digestive tract: Secondary | ICD-10-CM | POA: Insufficient documentation

## 2015-02-09 DIAGNOSIS — K219 Gastro-esophageal reflux disease without esophagitis: Secondary | ICD-10-CM | POA: Insufficient documentation

## 2015-02-09 DIAGNOSIS — N2889 Other specified disorders of kidney and ureter: Secondary | ICD-10-CM | POA: Diagnosis not present

## 2015-02-09 DIAGNOSIS — Z6830 Body mass index (BMI) 30.0-30.9, adult: Secondary | ICD-10-CM | POA: Insufficient documentation

## 2015-02-09 DIAGNOSIS — J45909 Unspecified asthma, uncomplicated: Secondary | ICD-10-CM | POA: Diagnosis not present

## 2015-02-09 DIAGNOSIS — Z85528 Personal history of other malignant neoplasm of kidney: Secondary | ICD-10-CM | POA: Insufficient documentation

## 2015-02-09 DIAGNOSIS — Z90710 Acquired absence of both cervix and uterus: Secondary | ICD-10-CM | POA: Insufficient documentation

## 2015-02-09 DIAGNOSIS — Z7982 Long term (current) use of aspirin: Secondary | ICD-10-CM | POA: Insufficient documentation

## 2015-02-09 DIAGNOSIS — I10 Essential (primary) hypertension: Secondary | ICD-10-CM | POA: Insufficient documentation

## 2015-02-09 DIAGNOSIS — M1990 Unspecified osteoarthritis, unspecified site: Secondary | ICD-10-CM | POA: Insufficient documentation

## 2015-02-09 DIAGNOSIS — N3941 Urge incontinence: Secondary | ICD-10-CM | POA: Diagnosis not present

## 2015-02-09 DIAGNOSIS — D49511 Neoplasm of unspecified behavior of right kidney: Secondary | ICD-10-CM | POA: Diagnosis not present

## 2015-02-09 DIAGNOSIS — E109 Type 1 diabetes mellitus without complications: Secondary | ICD-10-CM | POA: Insufficient documentation

## 2015-02-09 DIAGNOSIS — Z794 Long term (current) use of insulin: Secondary | ICD-10-CM | POA: Insufficient documentation

## 2015-02-09 DIAGNOSIS — Z79899 Other long term (current) drug therapy: Secondary | ICD-10-CM | POA: Insufficient documentation

## 2015-02-09 DIAGNOSIS — G4730 Sleep apnea, unspecified: Secondary | ICD-10-CM | POA: Insufficient documentation

## 2015-02-09 DIAGNOSIS — E669 Obesity, unspecified: Secondary | ICD-10-CM | POA: Insufficient documentation

## 2015-02-09 HISTORY — PX: CYSTOSCOPY WITH RETROGRADE PYELOGRAM, URETEROSCOPY AND STENT PLACEMENT: SHX5789

## 2015-02-09 LAB — GLUCOSE, CAPILLARY
GLUCOSE-CAPILLARY: 97 mg/dL (ref 65–99)
GLUCOSE-CAPILLARY: 144 mg/dL — AB (ref 65–99)
GLUCOSE-CAPILLARY: 103 mg/dL — AB (ref 65–99)
Glucose-Capillary: 68 mg/dL (ref 65–99)
Glucose-Capillary: 137 mg/dL — ABNORMAL HIGH (ref 65–99)

## 2015-02-09 SURGERY — CYSTOURETEROSCOPY, WITH RETROGRADE PYELOGRAM AND STENT INSERTION
Anesthesia: General | Laterality: Right

## 2015-02-09 MED ORDER — FENTANYL CITRATE (PF) 100 MCG/2ML IJ SOLN
INTRAMUSCULAR | Status: DC | PRN
  Administered 2015-02-09: 50 ug via INTRAVENOUS

## 2015-02-09 MED ORDER — DEXTROSE 50 % IV SOLN
INTRAVENOUS | Status: AC
  Filled 2015-02-09: qty 50

## 2015-02-09 MED ORDER — EPHEDRINE SULFATE 50 MG/ML IJ SOLN
INTRAMUSCULAR | Status: DC | PRN
  Administered 2015-02-09: 10 mg via INTRAVENOUS

## 2015-02-09 MED ORDER — LACTATED RINGERS IV SOLN
INTRAVENOUS | Status: DC
  Administered 2015-02-09: 1000 mL via INTRAVENOUS

## 2015-02-09 MED ORDER — 0.9 % SODIUM CHLORIDE (POUR BTL) OPTIME
TOPICAL | Status: DC | PRN
  Administered 2015-02-09: 1000 mL

## 2015-02-09 MED ORDER — HYDROCODONE-ACETAMINOPHEN 5-325 MG PO TABS: 1.0000 | ORAL_TABLET | Freq: Four times a day (QID) | ORAL | Status: DC | PRN

## 2015-02-09 MED ORDER — IOHEXOL 300 MG/ML  SOLN
INTRAMUSCULAR | Status: DC | PRN
  Administered 2015-02-09: 8 mL via URETHRAL

## 2015-02-09 MED ORDER — KETOROLAC TROMETHAMINE 15 MG/ML IJ SOLN
INTRAMUSCULAR | Status: DC
Start: 2015-02-09 — End: 2015-02-09
  Filled 2015-02-09: qty 1

## 2015-02-09 MED ORDER — CIPROFLOXACIN IN D5W 400 MG/200ML IV SOLN
400.0000 mg | INTRAVENOUS | Status: AC
  Administered 2015-02-09: 400 mg via INTRAVENOUS

## 2015-02-09 MED ORDER — PROPOFOL 10 MG/ML IV BOLUS
INTRAVENOUS | Status: DC | PRN
  Administered 2015-02-09: 120 mg via INTRAVENOUS

## 2015-02-09 MED ORDER — CIPROFLOXACIN IN D5W 400 MG/200ML IV SOLN
INTRAVENOUS | Status: AC
  Filled 2015-02-09: qty 200

## 2015-02-09 MED ORDER — DEXTROSE 50 % IV SOLN
25.0000 mL | Freq: Once | INTRAVENOUS | Status: AC
  Administered 2015-02-09: 25 mL via INTRAVENOUS

## 2015-02-09 MED ORDER — PROMETHAZINE HCL 25 MG/ML IJ SOLN: 6.2500 mg | INTRAMUSCULAR | Status: DC | PRN

## 2015-02-09 MED ORDER — ALBUTEROL SULFATE (2.5 MG/3ML) 0.083% IN NEBU
INHALATION_SOLUTION | RESPIRATORY_TRACT | Status: AC
  Filled 2015-02-09: qty 3

## 2015-02-09 MED ORDER — LIDOCAINE HCL (CARDIAC) 20 MG/ML IV SOLN
INTRAVENOUS | Status: DC | PRN
  Administered 2015-02-09: 50 mg via INTRAVENOUS

## 2015-02-09 MED ORDER — LACTATED RINGERS IV SOLN
INTRAVENOUS | Status: DC | PRN
  Administered 2015-02-09 (×2): via INTRAVENOUS

## 2015-02-09 MED ORDER — FENTANYL CITRATE (PF) 100 MCG/2ML IJ SOLN
INTRAMUSCULAR | Status: AC
  Filled 2015-02-09: qty 2

## 2015-02-09 MED ORDER — ONDANSETRON HCL 4 MG/2ML IJ SOLN
INTRAMUSCULAR | Status: DC | PRN
  Administered 2015-02-09: 4 mg via INTRAVENOUS

## 2015-02-09 MED ORDER — FENTANYL CITRATE (PF) 100 MCG/2ML IJ SOLN
25.0000 ug | INTRAMUSCULAR | Status: DC | PRN
  Administered 2015-02-09 (×2): 50 ug via INTRAVENOUS

## 2015-02-09 MED ORDER — PROPOFOL 10 MG/ML IV BOLUS
INTRAVENOUS | Status: AC
  Filled 2015-02-09: qty 40

## 2015-02-09 MED ORDER — SODIUM CHLORIDE 0.9 % IR SOLN
Status: DC | PRN
  Administered 2015-02-09: 4000 mL

## 2015-02-09 MED ORDER — ALBUTEROL SULFATE (2.5 MG/3ML) 0.083% IN NEBU
2.5000 mg | INHALATION_SOLUTION | Freq: Four times a day (QID) | RESPIRATORY_TRACT | Status: DC | PRN
  Administered 2015-02-09: 2.5 mg via RESPIRATORY_TRACT

## 2015-02-09 MED ORDER — KETOROLAC TROMETHAMINE 15 MG/ML IJ SOLN
15.0000 mg | Freq: Once | INTRAMUSCULAR | Status: AC
  Administered 2015-02-09: 15 mg via INTRAVENOUS

## 2015-02-09 SURGICAL SUPPLY — 12 items
BAG URO CATCHER STRL LF (MISCELLANEOUS) ×3 IMPLANT
CATH INTERMIT  6FR 70CM (CATHETERS) ×3 IMPLANT
CLOTH BEACON ORANGE TIMEOUT ST (SAFETY) ×3 IMPLANT
GLOVE BIOGEL M STRL SZ7.5 (GLOVE) ×5 IMPLANT
GOWN STRL REUS W/TWL LRG LVL3 (GOWN DISPOSABLE) ×6 IMPLANT
GUIDEWIRE ANG ZIPWIRE 038X150 (WIRE) IMPLANT
GUIDEWIRE STR DUAL SENSOR (WIRE) ×3 IMPLANT
MANIFOLD NEPTUNE II (INSTRUMENTS) ×3 IMPLANT
PACK CYSTO (CUSTOM PROCEDURE TRAY) ×3 IMPLANT
SHEATH ACCESS URETERAL 38CM (SHEATH) IMPLANT
TUBING CONNECTING 10 (TUBING) ×2 IMPLANT
TUBING CONNECTING 10' (TUBING) ×1

## 2015-02-09 NOTE — Anesthesia Procedure Notes (Signed)
Procedure Name: LMA Insertion Performed by: Caron Tardif J Pre-anesthesia Checklist: Patient identified, Emergency Drugs available, Suction available, Patient being monitored and Timeout performed Patient Re-evaluated:Patient Re-evaluated prior to inductionOxygen Delivery Method: Circle system utilized Preoxygenation: Pre-oxygenation with 100% oxygen Intubation Type: IV induction Ventilation: Mask ventilation without difficulty LMA: LMA inserted LMA Size: 4.0 Number of attempts: 1 Placement Confirmation: positive ETCO2,  CO2 detector and breath sounds checked- equal and bilateral Tube secured with: Tape Dental Injury: Teeth and Oropharynx as per pre-operative assessment        

## 2015-02-09 NOTE — Progress Notes (Signed)
Patient became short of breath-upper airway congestion-non-productive cough- states "I can't breath "- Color satisfactory- SA02 100- diminished breath sounds-no wheezing heard- Dr. Gifford Shave notified-

## 2015-02-09 NOTE — Progress Notes (Signed)
Patient breathing easy- SA02 100- Color satisfactory -no upper congestion heard- patient states she is breathing better- appearance relaxed

## 2015-02-09 NOTE — Op Note (Signed)
Preoperative diagnosis: Right renal mass  Postoperative diagnosis: Right renal mass  Procedure:  1. Cystoscopy 2. Right ureteroscopy 3. Right retrograde pyelography with interpretation  Surgeon: Pryor Curia. M.D.  Anesthesia: General  Complications: None  Intraoperative findings: Right retrograde pyelography demonstrated a normal caliber ureter without filling defects and no renal collecting filling defects except for mild splaying of the calyceal system in the interpolar kidney.  EBL: Minimal  Indication: [name]  is a 74 y.o. patient with a right renal mass centrally located within the right kidney representing a possible renal cell carcinoma vs urothelial carcinoma. After reviewing the management options for treatment, they elected to proceed with the above surgical procedure(s). We have discussed the potential benefits and risks of the procedure, side effects of the proposed treatment, the likelihood of the patient achieving the goals of the procedure, and any potential problems that might occur during the procedure or recuperation. Informed consent has been obtained.  Description of procedure:  The patient was taken to the operating room and general anesthesia was induced.  The patient was placed in the dorsal lithotomy position, prepped and draped in the usual sterile fashion, and preoperative antibiotics were administered. A preoperative time-out was performed.   Cystourethroscopy was performed.  The patient's urethra was examined and was normal. The bladder was then systematically examined in its entirety. There was no evidence for any bladder tumors, stones, or other mucosal pathology.    Attention then turned to the right ureteral orifice and a ureteral catheter was used to intubate the ureteral orifice.  Omnipaque contrast was injected through the ureteral catheter and a retrograde pyelogram was performed with findings as dictated above. There was mild splaying  of the interpolar region of the right collecting system and for this reason it was decided diagnostic ureteroscopy.  A 0.38 sensor guidewire was then advanced up the right ureter into the renal pelvis under fluoroscopic guidance. The single-lumen digital flexible ureteroscope was  Easily up into the right renal collecting system.  This was examined carefully in its entirety and no urothelial lesions were identified.  The flexible ureteroscope was then carefully withdrawn and no abnormalities were noted along the ureter.  Based on the fact that minimal ambulation was required minimal edema was expected and no indwelling stent was left indwelling stent was left indwelling.  The bladder was then emptied and the procedure ended.  The patient appeared to tolerate the procedure well and without complications.  The patient was able to be awakened and transferred to the recovery unit in satisfactory condition.   Pryor Curia MD

## 2015-02-09 NOTE — Progress Notes (Signed)
Dr. Gifford Shave in to see patient- talked with her- made aware of sleep apnea history and not using C-PAP- O.K. To go to Short Stay

## 2015-02-09 NOTE — Transfer of Care (Signed)
Immediate Anesthesia Transfer of Care Note  Patient: [name]  Procedure(s) Performed: Procedure(s): CYSTOSCOPY WITH RETROGRADE PYELOGRAM AND URETEROSCOPY  (Right)  Patient Location: PACU  Anesthesia Type:General  Level of Consciousness: awake, alert  and oriented  Airway & Oxygen Therapy: Patient Spontanous Breathing and Patient connected to face mask oxygen  Post-op Assessment: Report given to RN and Post -op Vital signs reviewed and stable  Post vital signs: Reviewed and stable  Last Vitals:  Filed Vitals:   02/09/15 0907  BP: 150/79  Pulse: 72  Temp: 36.7 C  Resp: 18    Complications: No apparent anesthesia complications

## 2015-02-09 NOTE — Progress Notes (Signed)
Dr. Gifford Shave in and talked with patient- Albuterol nebulizer treatment given as ordered

## 2015-02-09 NOTE — Anesthesia Postprocedure Evaluation (Signed)
Anesthesia Post Note  Patient: [name]  Procedure(s) Performed: Procedure(s) (LRB): CYSTOSCOPY WITH RETROGRADE PYELOGRAM AND URETEROSCOPY  (Right)  Patient location during evaluation: PACU Anesthesia Type: General Level of consciousness: awake and alert Pain management: pain level controlled Vital Signs Assessment: post-procedure vital signs reviewed and stable Respiratory status: spontaneous breathing, nonlabored ventilation, respiratory function stable and patient connected to nasal cannula oxygen Cardiovascular status: blood pressure returned to baseline and stable Postop Assessment: no signs of nausea or vomiting Anesthetic complications: no    Last Vitals:  Filed Vitals:   02/09/15 1430 02/09/15 1458  BP: 140/63 135/69  Pulse: 64 62  Temp: 36.4 C 36.5 C  Resp: 12 12    Last Pain:  Filed Vitals:   02/09/15 1500  PainSc: 0-No pain                 Catalina Gravel

## 2015-02-09 NOTE — Interval H&P Note (Signed)
History and Physical Interval Note:  02/09/2015 11:17 AM  [name]  has presented today for surgery, with the diagnosis of RIGHT RENAL MASS  The various methods of treatment have been discussed with the patient and family. After consideration of risks, benefits and other options for treatment, the patient has consented to  Procedure(s): CYSTOSCOPY WITH RETROGRADE PYELOGRAM, URETEROSCOPY WITH BIOPSY AND STENT PLACEMENT (Right) as a surgical intervention .  The patient's history has been reviewed, patient examined, no change in status, stable for surgery.  I have reviewed the patient's chart and labs.  Questions were answered to the patient's satisfaction.     Sheleen Conchas,LES

## 2015-02-09 NOTE — Anesthesia Preprocedure Evaluation (Addendum)
Anesthesia Evaluation  Patient identified by MRN, date of birth, ID band Patient awake    Reviewed: Allergy & Precautions, NPO status , Patient's Chart, lab work & pertinent test results  Airway Mallampati: III  TM Distance: >3 FB Neck ROM: Full    Dental no notable dental hx.    Pulmonary shortness of breath, asthma , sleep apnea ,    Pulmonary exam normal breath sounds clear to auscultation       Cardiovascular Exercise Tolerance: Good hypertension, Pt. on medications and Pt. on home beta blockers + angina (-) CAD and (-) Past MI Normal cardiovascular exam+ dysrhythmias  Rhythm:Regular Rate:Normal  LHC 07/28/2014:  Left Ventricle: The left ventricular systolic function is normal. The left ventricular ejection fraction is 55-65% by visual estimate.  Aortic Valve:There is no aortic valve stenosis, and no aortic valve regurgitation. No calcification found in the aortic valve. There is normal aortic valve motion. Ascending aorta normal without aneurysm Aorta: The size of the ascending aorta is normal. The aorta is not aneurysmal.     Neuro/Psych  Neuromuscular disease negative psych ROS   GI/Hepatic Neg liver ROS, GERD  Medicated,  Endo/Other  diabetes, Type 1, Insulin DependentObesity   Renal/GU Renal diseaserenal cell carcinoma   negative genitourinary   Musculoskeletal  (+) Arthritis ,   Abdominal (+) + obese,   Peds negative pediatric ROS (+)  Hematology negative hematology ROS (+)   Anesthesia Other Findings   Reproductive/Obstetrics negative OB ROS                           Anesthesia Physical Anesthesia Plan  ASA: III  Anesthesia Plan: General   Post-op Pain Management:    Induction: Intravenous  Airway Management Planned: LMA  Additional Equipment:   Intra-op Plan:   Post-operative Plan: Extubation in OR  Informed Consent: I have reviewed the patients History and  Physical, chart, labs and discussed the procedure including the risks, benefits and alternatives for the proposed anesthesia with the patient or authorized representative who has indicated his/her understanding and acceptance.   Dental advisory given  Plan Discussed with: CRNA  Anesthesia Plan Comments:         Anesthesia Quick Evaluation

## 2015-02-09 NOTE — Discharge Instructions (Signed)
1. You may see some blood in the urine and may have some burning with urination for 48-72 hours. You also may notice that you have to urinate more frequently or urgently after your procedure which is normal.  °2. You should call should you develop an inability urinate, fever > 101, persistent nausea and vomiting that prevents you from eating or drinking to stay hydrated.  °

## 2015-02-10 ENCOUNTER — Other Ambulatory Visit (HOSPITAL_COMMUNITY): Payer: Self-pay | Admitting: Urology

## 2015-02-10 DIAGNOSIS — N2889 Other specified disorders of kidney and ureter: Secondary | ICD-10-CM

## 2015-02-12 ENCOUNTER — Encounter (HOSPITAL_COMMUNITY): Payer: Self-pay | Admitting: Emergency Medicine

## 2015-02-12 ENCOUNTER — Emergency Department (HOSPITAL_COMMUNITY): Payer: Medicare Other

## 2015-02-12 ENCOUNTER — Emergency Department (HOSPITAL_COMMUNITY)
Admission: EM | Admit: 2015-02-12 | Discharge: 2015-02-12 | Disposition: A | Discharge status: Home / Self Care | Payer: Medicare Other | Attending: Emergency Medicine | Admitting: Emergency Medicine

## 2015-02-12 DIAGNOSIS — E109 Type 1 diabetes mellitus without complications: Secondary | ICD-10-CM | POA: Insufficient documentation

## 2015-02-12 DIAGNOSIS — Z9889 Other specified postprocedural states: Secondary | ICD-10-CM | POA: Insufficient documentation

## 2015-02-12 DIAGNOSIS — I209 Angina pectoris, unspecified: Secondary | ICD-10-CM | POA: Insufficient documentation

## 2015-02-12 DIAGNOSIS — Z794 Long term (current) use of insulin: Secondary | ICD-10-CM | POA: Diagnosis not present

## 2015-02-12 DIAGNOSIS — Z8669 Personal history of other diseases of the nervous system and sense organs: Secondary | ICD-10-CM | POA: Insufficient documentation

## 2015-02-12 DIAGNOSIS — Z87448 Personal history of other diseases of urinary system: Secondary | ICD-10-CM | POA: Diagnosis not present

## 2015-02-12 DIAGNOSIS — J45909 Unspecified asthma, uncomplicated: Secondary | ICD-10-CM | POA: Diagnosis not present

## 2015-02-12 DIAGNOSIS — Z7982 Long term (current) use of aspirin: Secondary | ICD-10-CM | POA: Insufficient documentation

## 2015-02-12 DIAGNOSIS — E7800 Pure hypercholesterolemia, unspecified: Secondary | ICD-10-CM | POA: Diagnosis not present

## 2015-02-12 DIAGNOSIS — I10 Essential (primary) hypertension: Secondary | ICD-10-CM | POA: Insufficient documentation

## 2015-02-12 DIAGNOSIS — R109 Unspecified abdominal pain: Secondary | ICD-10-CM

## 2015-02-12 DIAGNOSIS — Z79899 Other long term (current) drug therapy: Secondary | ICD-10-CM | POA: Diagnosis not present

## 2015-02-12 DIAGNOSIS — Z9049 Acquired absence of other specified parts of digestive tract: Secondary | ICD-10-CM | POA: Diagnosis not present

## 2015-02-12 DIAGNOSIS — R319 Hematuria, unspecified: Secondary | ICD-10-CM | POA: Diagnosis not present

## 2015-02-12 DIAGNOSIS — Z85528 Personal history of other malignant neoplasm of kidney: Secondary | ICD-10-CM | POA: Diagnosis not present

## 2015-02-12 DIAGNOSIS — K219 Gastro-esophageal reflux disease without esophagitis: Secondary | ICD-10-CM | POA: Insufficient documentation

## 2015-02-12 DIAGNOSIS — N12 Tubulo-interstitial nephritis, not specified as acute or chronic: Secondary | ICD-10-CM | POA: Insufficient documentation

## 2015-02-12 DIAGNOSIS — R1031 Right lower quadrant pain: Secondary | ICD-10-CM | POA: Diagnosis not present

## 2015-02-12 LAB — COMPREHENSIVE METABOLIC PANEL
ALK PHOS: 78 U/L (ref 38–126)
ALT: 20 U/L (ref 14–54)
ANION GAP: 10 (ref 5–15)
AST: 21 U/L (ref 15–41)
Albumin: 4.2 g/dL (ref 3.5–5.0)
BILIRUBIN TOTAL: 0.6 mg/dL (ref 0.3–1.2)
BUN: 20 mg/dL (ref 6–20)
CALCIUM: 9.6 mg/dL (ref 8.9–10.3)
CO2: 28 mmol/L (ref 22–32)
Chloride: 104 mmol/L (ref 101–111)
Creatinine, Ser: 1.34 mg/dL — ABNORMAL HIGH (ref 0.44–1.00)
GFR calc Af Amer: 43 mL/min — ABNORMAL LOW (ref >60)
GFR, EST NON AFRICAN AMERICAN: 37 mL/min — AB (ref >60)
Glucose, Bld: 201 mg/dL — ABNORMAL HIGH (ref 65–99)
POTASSIUM: 4.2 mmol/L (ref 3.5–5.1)
Sodium: 142 mmol/L (ref 135–145)
TOTAL PROTEIN: 7.6 g/dL (ref 6.5–8.1)

## 2015-02-12 LAB — URINE MICROSCOPIC-ADD ON: BACTERIA UA: NONE SEEN

## 2015-02-12 LAB — CBC
HEMATOCRIT: 37.3 % (ref 36.0–46.0)
HEMOGLOBIN: 12.5 g/dL (ref 12.0–15.0)
MCH: 30.2 pg (ref 26.0–34.0)
MCHC: 33.5 g/dL (ref 30.0–36.0)
MCV: 90.1 fL (ref 78.0–100.0)
Platelets: 192 10*3/uL (ref 150–400)
RBC: 4.14 MIL/uL (ref 3.87–5.11)
RDW: 13.6 % (ref 11.5–15.5)
WBC: 5.5 10*3/uL (ref 4.0–10.5)

## 2015-02-12 LAB — URINALYSIS, ROUTINE W REFLEX MICROSCOPIC
Bilirubin Urine: NEGATIVE
GLUCOSE, UA: 100 mg/dL — AB
Ketones, ur: NEGATIVE mg/dL
Nitrite: NEGATIVE
PH: 5.5 (ref 5.0–8.0)
Protein, ur: NEGATIVE mg/dL
SPECIFIC GRAVITY, URINE: 1.008 (ref 1.005–1.030)

## 2015-02-12 MED ORDER — OXYCODONE HCL 5 MG PO TABS: 5.0000 mg | ORAL_TABLET | Freq: Two times a day (BID) | ORAL | Status: DC | PRN

## 2015-02-12 MED ORDER — FENTANYL CITRATE (PF) 100 MCG/2ML IJ SOLN
50.0000 ug | Freq: Once | INTRAMUSCULAR | Status: AC
  Administered 2015-02-12: 50 ug via INTRAVENOUS
  Filled 2015-02-12: qty 2

## 2015-02-12 MED ORDER — ONDANSETRON HCL 4 MG/2ML IJ SOLN
4.0000 mg | Freq: Once | INTRAMUSCULAR | Status: AC
  Administered 2015-02-12: 4 mg via INTRAVENOUS
  Filled 2015-02-12: qty 2

## 2015-02-12 MED ORDER — MORPHINE SULFATE (PF) 2 MG/ML IV SOLN
2.0000 mg | Freq: Once | INTRAVENOUS | Status: AC
  Administered 2015-02-12: 2 mg via INTRAVENOUS
  Filled 2015-02-12: qty 1

## 2015-02-12 MED ORDER — CEPHALEXIN 500 MG PO CAPS
500.0000 mg | ORAL_CAPSULE | Freq: Once | ORAL | Status: AC
  Administered 2015-02-12: 500 mg via ORAL
  Filled 2015-02-12: qty 1

## 2015-02-12 MED ORDER — CEPHALEXIN 500 MG PO CAPS: 500.0000 mg | ORAL_CAPSULE | Freq: Two times a day (BID) | ORAL | Status: DC

## 2015-02-12 NOTE — ED Provider Notes (Signed)
CSN: [id number]     Arrival date & time 02/12/15  0137 History  By signing my name below, I, [name], attest that this documentation has been prepared under the direction and in the presence of Everlene Balls, MD. Electronically Signed: Irene [name], ED Scribe. 02/12/2015. 2:59 AM.  Chief Complaint  Patient presents with  . Flank Pain  . Hematuria   The history is provided by the patient. No language interpreter was used.  HPI Comments: [name] is a 74 y.o. female with a hx of appendectomy, abdominal hysterectomy, HTN, renal failure, and Type I DM who presents to the Emergency Department complaining of severe, gradually worsening right flank pain and hematuria onset 5 hours ago. Pt reports associated nausea and left flank pain that started in the ED. Pt states that she had surgery on Thursday to clear a blockage in her kidneys. She states that her doctor removed a cancerous mass from her left kidney. Pt took pain medication before arriving to the ED and states that the medication she received in the ED brought her relief. Pt denies fever, chills, vomiting, diarrhea, or dysuria.   Past Medical History  Diagnosis Date  . Hypertension   . Renal failure (ARF), acute on chronic (HCC)   . Hypercholesteremia   . Reflux   . Asthma   . Anginal pain ([place])   . Dysrhythmia     "irregular heart beat"  . Sleep apnea     does not use c-[name] machine  . Shortness of breath dyspnea     with excertion  . Diabetes mellitus     Type 1  . GERD (gastroesophageal reflux disease)   . left renal ca dx'd 2012 (?)    surg only, left kidney   Past Surgical History  Procedure Laterality Date  . Renal mass excision    . Abdominal hysterectomy    . Appendectomy    . Spine surgery    . Cardiac catheterization N/A 07/28/2014    Procedure: Left Heart Cath and Coronary Angiography;  Surgeon: [name], MD;  Location: [facility];  Service: Cardiovascular;  Laterality: N/A;  . Peripheral  vascular catheterization N/A 07/28/2014    Procedure: Aortic Arch Angiography;  Surgeon: [name], MD;  Location: [facility];  Service: Cardiovascular;  Laterality: N/A;   Family History  Problem Relation Age of Onset  . Coronary artery disease    . Hypertension Mother    Social History  Substance Use Topics  . Smoking status: Never Smoker   . Smokeless tobacco: Never Used  . Alcohol Use: No   OB History    No data available     Review of Systems 10 Systems reviewed and all are negative for acute change except as noted in the HPI.  Allergies  Sulfa antibiotics  Home Medications   Prior to Admission medications   Medication Sig Start Date End Date Taking? Authorizing Provider  albuterol (PROVENTIL,VENTOLIN) 90 MCG/ACT inhaler Inhale 2 puffs into the lungs every 4 (four) hours as needed for wheezing or shortness of breath.     Historical Provider, MD  amLODipine (NORVASC) 2.5 MG tablet Take 2.5 mg by mouth daily.      Historical Provider, MD  aspirin 81 MG chewable tablet Chew 81 mg by mouth at bedtime.    Historical Provider, MD  atorvastatin (LIPITOR) 10 MG tablet Take 10 mg by mouth daily.      Historical Provider, MD  Cholecalciferol (VITAMIN D) 1000  UNITS capsule Take 1,000 Units by mouth daily.      Historical Provider, MD  esomeprazole (NEXIUM) 40 MG capsule Take 40 mg by mouth daily as needed (acid reflux).     Historical Provider, MD  gabapentin (NEURONTIN) 300 MG capsule TAKE 1 CAPSULE (300 MG TOTAL) BY MOUTH AT BEDTIME. 11/14/14   Gean Birchwood, DPM  HYDROcodone-acetaminophen (NORCO/VICODIN) 5-325 MG tablet Take 1-2 tablets by mouth every 6 (six) hours as needed. 02/09/15   Raynelle Bring, MD  Insulin Degludec (TRESIBA FLEXTOUCH) 200 UNIT/ML SOPN Inject 68 Units into the skin daily before breakfast.    Historical Provider, MD  lisinopril (PRINIVIL,ZESTRIL) 40 MG tablet Take 40 mg by mouth daily.      Historical Provider, MD  metoprolol tartrate (LOPRESSOR) 25  MG tablet Take 25 mg by mouth 2 (two) times daily.    Historical Provider, MD  mirabegron ER (MYRBETRIQ) 50 MG TB24 tablet Take 50 mg by mouth daily.    Historical Provider, MD  Multiple Vitamin (MULTIVITAMIN WITH MINERALS) TABS tablet Take 1 tablet by mouth daily.    Historical Provider, MD  nitroGLYCERIN (NITROSTAT) 0.4 MG SL tablet Place 0.4 mg under the tongue every 5 (five) minutes as needed for chest pain.    Historical Provider, MD   BP 171/87 mmHg  Pulse 70  Temp(Src) 97.7 F (36.5 C) (Oral)  Resp 18  Ht 4\' 11"  (1.499 m)  Wt 152 lb (68.947 kg)  BMI 30.68 kg/m2  SpO2 98% Physical Exam  Constitutional: She is oriented to person, place, and time. She appears well-developed and well-nourished. No distress.  HENT:  Head: Normocephalic and atraumatic.  Nose: Nose normal.  Mouth/Throat: Oropharynx is clear and moist. No oropharyngeal exudate.  Eyes: Conjunctivae and EOM are normal. Pupils are equal, round, and reactive to light. No scleral icterus.  Neck: Normal range of motion. Neck supple. No JVD present. No tracheal deviation present. No thyromegaly present.  Cardiovascular: Normal rate, regular rhythm and normal heart sounds.  Exam reveals no gallop and no friction rub.   No murmur heard. Pulmonary/Chest: Effort normal and breath sounds normal. No respiratory distress. She has no wheezes. She exhibits no tenderness.  Abdominal: Soft. Bowel sounds are normal. She exhibits no distension and no mass. There is tenderness in the right lower quadrant. There is no rebound and no guarding.  RLQ tenderness to palpation  Musculoskeletal: Normal range of motion. She exhibits no edema or tenderness.  Lymphadenopathy:    She has no cervical adenopathy.  Neurological: She is alert and oriented to person, place, and time. No cranial nerve deficit. She exhibits normal muscle tone.  Skin: Skin is warm and dry. No rash noted. No erythema. No pallor.  Nursing note and vitals reviewed.   ED  Course  Procedures (including critical care time) DIAGNOSTIC STUDIES: Oxygen Saturation is 98% on RA, normal by my interpretation.    COORDINATION OF CARE: 3:30 AM-Discussed treatment plan which includes labs and CT scan with pt at bedside and pt agreed to plan.    Labs Review Labs Reviewed  URINALYSIS, ROUTINE W REFLEX MICROSCOPIC (NOT AT [facility]) - Abnormal; Notable for the following:    APPearance CLOUDY (*)    Glucose, UA 100 (*)    Hgb urine dipstick LARGE (*)    Leukocytes, UA TRACE (*)    All other components within normal limits  COMPREHENSIVE METABOLIC PANEL - Abnormal; Notable for the following:    Glucose, Bld 201 (*)    Creatinine, Ser  1.34 (*)    GFR calc non Af Amer 37 (*)    GFR calc Af Amer 43 (*)    All other components within normal limits  URINE MICROSCOPIC-ADD ON - Abnormal; Notable for the following:    Squamous Epithelial / LPF 0-5 (*)    All other components within normal limits  CBC    Imaging Review Ct Renal Stone Study  02/12/2015  CLINICAL DATA:  Acute onset of worsening right flank pain and hematuria. Initial encounter. EXAM: CT ABDOMEN AND PELVIS WITHOUT CONTRAST TECHNIQUE: Multidetector CT imaging of the abdomen and pelvis was performed following the standard protocol without IV contrast. COMPARISON:  CT of the abdomen and pelvis performed 01/14/2015, and MRI of the abdomen performed 01/30/2015 FINDINGS: Minimal bibasilar opacities may reflect a mild infectious process or chronic scarring. The liver and spleen are unremarkable in appearance. The gallbladder is within normal limits. The pancreas and adrenal glands are unremarkable. Mild right-sided perinephric stranding is noted. There is mild right-sided pelvicaliectasis, with mild stranding along the course of the right ureter, raising question for mild ureteritis and pyelonephritis. There is mild scarring involving the lower pole of the left kidney. No obstructing ureteral stones are identified. No free  fluid is identified. The small bowel is unremarkable in appearance. The stomach is within normal limits. No acute vascular abnormalities are seen. The appendix is grossly normal in caliber, without evidence of appendicitis. The colon is unremarkable in appearance. The bladder is relatively decompressed. A small amount of air is noted within the bladder. This could reflect recent surgery, though would correlate clinically to assess for emphysematous cystitis. No inguinal lymphadenopathy is seen. No acute osseous abnormalities are identified. Facet disease is noted at the lower lumbar spine. IMPRESSION: 1. Mild right-sided perinephric stranding noted. Mild stranding along the course of the right ureter. This raises question for mild ureteritis and pyelonephritis. 2. Small amount of air within the bladder. This could reflect recent surgery, though would correlate clinically to assess for emphysematous cystitis. 3. Persistent minimal bibasilar opacities may reflect a mild infectious process or chronic scarring. Electronically Signed   By: Garald Balding M.D.   On: 02/12/2015 04:38   I have personally reviewed and evaluated these images and lab results as part of my medical decision-making.   EKG Interpretation None      MDM   Final diagnoses:  None   Patient presents to the ED for severe RLQ and Rflank pain.  She recently had instrumentation. I have concern for possible urine infection. Urinalysis is negative but CT scan does show pyelonephritis. No kidney stones noted. Patient will be treated with 10 days of Keflex. First dose given in the emergency department. She is encouraged to follow-up with her urologist within the next 3 days. She appears well in no acute distress, vital signs were within her normal limits and she is safe for discharge.    I personally performed the services described in this documentation, which was scribed in my presence. The recorded information has been reviewed and is  accurate.     Everlene Balls, MD 02/12/15 [phone]

## 2015-02-12 NOTE — ED Notes (Signed)
Patient transported to CT 

## 2015-02-12 NOTE — Discharge Instructions (Signed)
Pyelonephritis, Adult Ms. Prime, you have an infection in your urine and kidneys. Take antibiotics as directed and see your urologist within 3 days for close follow-up. If any symptoms worsen come back to emergency department immediately. Thank you. Pyelonephritis is a kidney infection. The kidneys are organs that help clean your blood by moving waste out of your blood and into your pee (urine). This infection can happen quickly, or it can last for a long time. In most cases, it clears up with treatment and does not cause other problems. HOME CARE Medicines  Take over-the-counter and prescription medicines only as told by your doctor.  Take your antibiotic medicine as told by your doctor. Do not stop taking the medicine even if you start to feel better. General Instructions  Drink enough fluid to keep your pee clear or pale yellow.  Avoid caffeine, tea, and carbonated drinks.  Pee (urinate) often. Avoid holding in pee for long periods of time.  Pee before and after sex.  After pooping (having a bowel movement), women should wipe from front to back. Use each tissue only once.  Keep all follow-up visits as told by your doctor. This is important. GET HELP IF:  You do not feel better after 2 days.  Your symptoms get worse.  You have a fever. GET HELP RIGHT AWAY IF:  You cannot take your medicine or drink fluids as told.  You have chills and shaking.  You throw up (vomit).  You have very bad pain in your side (flank) or back.  You feel very weak or you pass out (faint).   This information is not intended to replace advice given to you by your health care provider. Make sure you discuss any questions you have with your health care provider.   Document Released: 03/14/2004 Document Revised: 10/26/2014 Document Reviewed: 05/30/2014 Elsevier Interactive Patient Education [employer].

## 2015-02-14 ENCOUNTER — Encounter (HOSPITAL_COMMUNITY): Payer: Self-pay | Admitting: Urology

## 2015-02-22 ENCOUNTER — Ambulatory Visit (HOSPITAL_COMMUNITY): Payer: Medicare Other

## 2015-02-22 ENCOUNTER — Other Ambulatory Visit: Payer: Self-pay | Admitting: Radiology

## 2015-02-24 ENCOUNTER — Ambulatory Visit (HOSPITAL_COMMUNITY)
Admission: RE | Admit: 2015-02-24 | Discharge: 2015-02-24 | Disposition: A | Discharge status: Home / Self Care | Payer: Medicare Other | Source: Ambulatory Visit | Attending: Urology | Admitting: Urology

## 2015-02-24 ENCOUNTER — Encounter (HOSPITAL_COMMUNITY): Payer: Self-pay

## 2015-02-24 DIAGNOSIS — E109 Type 1 diabetes mellitus without complications: Secondary | ICD-10-CM | POA: Insufficient documentation

## 2015-02-24 DIAGNOSIS — K219 Gastro-esophageal reflux disease without esophagitis: Secondary | ICD-10-CM | POA: Insufficient documentation

## 2015-02-24 DIAGNOSIS — I10 Essential (primary) hypertension: Secondary | ICD-10-CM | POA: Diagnosis not present

## 2015-02-24 DIAGNOSIS — Z8249 Family history of ischemic heart disease and other diseases of the circulatory system: Secondary | ICD-10-CM | POA: Insufficient documentation

## 2015-02-24 DIAGNOSIS — Z7982 Long term (current) use of aspirin: Secondary | ICD-10-CM | POA: Insufficient documentation

## 2015-02-24 DIAGNOSIS — Z79899 Other long term (current) drug therapy: Secondary | ICD-10-CM | POA: Insufficient documentation

## 2015-02-24 DIAGNOSIS — Z794 Long term (current) use of insulin: Secondary | ICD-10-CM | POA: Insufficient documentation

## 2015-02-24 DIAGNOSIS — J45909 Unspecified asthma, uncomplicated: Secondary | ICD-10-CM | POA: Insufficient documentation

## 2015-02-24 DIAGNOSIS — E7800 Pure hypercholesterolemia, unspecified: Secondary | ICD-10-CM | POA: Insufficient documentation

## 2015-02-24 DIAGNOSIS — N2889 Other specified disorders of kidney and ureter: Secondary | ICD-10-CM | POA: Diagnosis present

## 2015-02-24 DIAGNOSIS — C641 Malignant neoplasm of right kidney, except renal pelvis: Secondary | ICD-10-CM | POA: Diagnosis not present

## 2015-02-24 DIAGNOSIS — Z85528 Personal history of other malignant neoplasm of kidney: Secondary | ICD-10-CM | POA: Insufficient documentation

## 2015-02-24 DIAGNOSIS — Z905 Acquired absence of kidney: Secondary | ICD-10-CM | POA: Insufficient documentation

## 2015-02-24 LAB — BASIC METABOLIC PANEL
ANION GAP: 10 (ref 5–15)
BUN: 19 mg/dL (ref 6–20)
CHLORIDE: 109 mmol/L (ref 101–111)
CO2: 25 mmol/L (ref 22–32)
Calcium: 9.6 mg/dL (ref 8.9–10.3)
Creatinine, Ser: 1 mg/dL (ref 0.44–1.00)
GFR calc non Af Amer: 53 mL/min — ABNORMAL LOW (ref >60)
GLUCOSE: 96 mg/dL (ref 65–99)
POTASSIUM: 3.9 mmol/L (ref 3.5–5.1)
Sodium: 144 mmol/L (ref 135–145)

## 2015-02-24 LAB — CBC WITH DIFFERENTIAL/PLATELET
BASOS ABS: 0 10*3/uL (ref 0.0–0.1)
BASOS PCT: 1 %
EOS PCT: 4 %
Eosinophils Absolute: 0.1 10*3/uL (ref 0.0–0.7)
HCT: 41.2 % (ref 36.0–46.0)
Hemoglobin: 13.3 g/dL (ref 12.0–15.0)
Lymphocytes Relative: 44 %
Lymphs Abs: 1.8 10*3/uL (ref 0.7–4.0)
MCH: 29.6 pg (ref 26.0–34.0)
MCHC: 32.3 g/dL (ref 30.0–36.0)
MCV: 91.8 fL (ref 78.0–100.0)
MONO ABS: 0.4 10*3/uL (ref 0.1–1.0)
MONOS PCT: 11 %
Neutro Abs: 1.6 10*3/uL — ABNORMAL LOW (ref 1.7–7.7)
Neutrophils Relative %: 40 %
PLATELETS: 237 10*3/uL (ref 150–400)
RBC: 4.49 MIL/uL (ref 3.87–5.11)
RDW: 13.6 % (ref 11.5–15.5)
WBC: 4 10*3/uL (ref 4.0–10.5)

## 2015-02-24 LAB — PROTIME-INR
INR: 0.91 (ref 0.00–1.49)
Prothrombin Time: 12.5 seconds (ref 11.6–15.2)

## 2015-02-24 LAB — GLUCOSE, CAPILLARY: Glucose-Capillary: 94 mg/dL (ref 65–99)

## 2015-02-24 MED ORDER — MIDAZOLAM HCL 2 MG/2ML IJ SOLN
INTRAMUSCULAR | Status: AC
  Filled 2015-02-24: qty 4

## 2015-02-24 MED ORDER — MIDAZOLAM HCL 2 MG/2ML IJ SOLN
INTRAMUSCULAR | Status: AC | PRN
  Administered 2015-02-24: 1 mg via INTRAVENOUS
  Administered 2015-02-24: 0.5 mg via INTRAVENOUS

## 2015-02-24 MED ORDER — FENTANYL CITRATE (PF) 100 MCG/2ML IJ SOLN
INTRAMUSCULAR | Status: AC
  Filled 2015-02-24: qty 4

## 2015-02-24 MED ORDER — SODIUM CHLORIDE 0.9 % IV SOLN
INTRAVENOUS | Status: DC
  Administered 2015-02-24: 07:00:00 via INTRAVENOUS

## 2015-02-24 MED ORDER — HYDROCODONE-ACETAMINOPHEN 5-325 MG PO TABS
1.0000 | ORAL_TABLET | ORAL | Status: DC | PRN
  Administered 2015-02-24: 1 via ORAL
  Filled 2015-02-24 (×3): qty 1

## 2015-02-24 MED ORDER — FENTANYL CITRATE (PF) 100 MCG/2ML IJ SOLN
INTRAMUSCULAR | Status: AC | PRN
  Administered 2015-02-24: 50 ug via INTRAVENOUS

## 2015-02-24 NOTE — Discharge Instructions (Signed)
Quiet rest remainder of day.Leave bandaid on for 24hours then may remove and shower as usual. No change in diet or medications.Call [facility] at [phone] for any problems or questions. Bleeding or pain not relieved by medication.   Kidney Biopsy, Care After Refer to this sheet in the next few weeks. These instructions provide you with information on caring for yourself after your procedure. Your health care provider may also give you more specific instructions. Your treatment has been planned according to current medical practices, but problems sometimes occur. Call your health care provider if you have any problems or questions after your procedure.  WHAT TO EXPECT AFTER THE PROCEDURE   You may notice blood in the urine for the first 24 hours after the biopsy.  You may feel some pain at the biopsy site for 1-2 weeks after the biopsy. HOME CARE INSTRUCTIONS  Do not lift anything heavier than 10 lb (4.5 kg) for 2 weeks.  Do not take any non-steroidal anti-inflammatory drugs (NSAIDs) or any blood thinners for a week after the biopsy unless instructed to do so by your health care provider.  Only take medicines for pain, fever, or discomfort as directed by your health care provider. SEEK MEDICAL CARE IF:  You have bloody urine more than 24 hours after the biopsy.   You develop a fever.   You cannot urinate.   You have increasing pain at the biopsy site.  SEEK IMMEDIATE MEDICAL CARE IF: You feel faint or dizzy.    This information is not intended to replace advice given to you by your health care provider. Make sure you discuss any questions you have with your health care provider.   Document Released: 10/07/2012 Document Reviewed: 10/07/2012 Elsevier Interactive Patient Education 2016 [place]. Moderate Conscious Sedation, Adult, Care After Refer to this sheet in the next few weeks. These instructions provide you with information on caring for yourself after  your procedure. Your health care provider may also give you more specific instructions. Your treatment has been planned according to current medical practices, but problems sometimes occur. Call your health care provider if you have any problems or questions after your procedure. WHAT TO EXPECT AFTER THE PROCEDURE  After your procedure:  You may feel sleepy, clumsy, and have poor balance for several hours.  Vomiting may occur if you eat too soon after the procedure. HOME CARE INSTRUCTIONS  Do not participate in any activities where you could become injured for at least 24 hours. Do not:  Drive.  Swim.  Ride a bicycle.  Operate heavy machinery.  Cook.  Use power tools.  Climb ladders.  Work from a high place.  Do not make important decisions or sign legal documents until you are improved.  If you vomit, drink water, juice, or soup when you can drink without vomiting. Make sure you have little or no nausea before eating solid foods.  Only take over-the-counter or prescription medicines for pain, discomfort, or fever as directed by your health care provider.  Make sure you and your family fully understand everything about the medicines given to you, including what side effects may occur.  You should not drink alcohol, take sleeping pills, or take medicines that cause drowsiness for at least 24 hours.  If you smoke, do not smoke without supervision.  If you are feeling better, you may resume normal activities 24 hours after you were sedated.  Keep all appointments with your health care provider. SEEK MEDICAL CARE IF:  Your  skin is pale or bluish in color.  You continue to feel nauseous or vomit.  Your pain is getting worse and is not helped by medicine.  You have bleeding or swelling.  You are still sleepy or feeling clumsy after 24 hours. SEEK IMMEDIATE MEDICAL CARE IF:  You develop a rash.  You have difficulty breathing.  You develop any type of allergic  problem.  You have a fever. MAKE SURE YOU:  Understand these instructions.  Will watch your condition.  Will get help right away if you are not doing well or get worse.   This information is not intended to replace advice given to you by your health care provider. Make sure you discuss any questions you have with your health care provider.   Document Released: 11/25/2012 Document Revised: 02/25/2014 Document Reviewed: 11/25/2012 Elsevier Interactive Patient Education [employer].

## 2015-02-24 NOTE — Procedures (Signed)
Interventional Radiology Procedure Note  Procedure:  CT guided right renal mass core biopsy  Complications:  None  Estimated Blood Loss: < 10 mL  2.2 cm mass in central right kidney localized.  17 G needle advanced.  18 G core biopsy x 2. Gelfoam pledgets advanced after biopsy. No immediate complications Monitor for hematuria during recovery.  Venetia Night. Kathlene Cote, M.D Pager:  [phone]

## 2015-02-24 NOTE — Sedation Documentation (Signed)
Patient is resting comfortably. 

## 2015-02-24 NOTE — H&P (Signed)
Chief Complaint: Patient was seen in consultation today for CT-guided right renal mass biopsy  Referring Physician(s): Borden,Lester  History of Present Illness: [name] is a 75 y.o. female with prior history of left renal cell carcinoma and partial nephrectomy in 2010. 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 She presents today for CT-guided biopsy of the right renal lesion.  Past Medical History  Diagnosis Date  . Hypertension   . Renal failure (ARF), acute on chronic (HCC)   . Hypercholesteremia   . Reflux   . Asthma   . Anginal pain ([place])   . Dysrhythmia     "irregular heart beat"  . Sleep apnea     does not use c-pap machine  . Shortness of breath dyspnea     with excertion  . Diabetes mellitus     Type 1  . GERD (gastroesophageal reflux disease)   . left renal ca dx'd 2012 (?)    surg only, left kidney    Past Surgical History  Procedure Laterality Date  . Renal mass excision    . Abdominal hysterectomy    . Appendectomy    . Spine surgery    . Cardiac catheterization N/A 07/28/2014    Procedure: Left Heart Cath and Coronary Angiography;  Surgeon: [name], MD;  Location: [facility];  Service: Cardiovascular;  Laterality: N/A;  . Peripheral vascular catheterization N/A 07/28/2014    Procedure: Aortic Arch Angiography;  Surgeon: [name], MD;  Location: [facility];  Service: Cardiovascular;  Laterality: N/A;  . Cystoscopy with retrograde pyelogram, ureteroscopy and stent placement Right 02/09/2015    Procedure: CYSTOSCOPY WITH RETROGRADE PYELOGRAM AND URETEROSCOPY ;  Surgeon: Raynelle Bring, MD;  Location: [facility];  Service: Urology;  Laterality: Right;    Allergies: Sulfa antibiotics  Medications: Prior to Admission medications   Medication Sig Start Date End Date Taking? Authorizing Provider  amLODipine (NORVASC) 2.5 MG tablet Take 2.5 mg by mouth  daily.     Yes Historical Provider, MD  aspirin 81 MG chewable tablet Chew 81 mg by mouth at bedtime.   Yes Historical Provider, MD  atorvastatin (LIPITOR) 10 MG tablet Take 10 mg by mouth daily.     Yes Historical Provider, MD  cephALEXin (KEFLEX) 500 MG capsule Take 1 capsule (500 mg total) by mouth 2 (two) times daily. 02/12/15  Yes Everlene Balls, MD  Cholecalciferol (VITAMIN D) 1000 UNITS capsule Take 1,000 Units by mouth daily.     Yes Historical Provider, MD  gabapentin (NEURONTIN) 300 MG capsule TAKE 1 CAPSULE (300 MG TOTAL) BY MOUTH AT BEDTIME. 11/14/14  Yes Richard Joelene Millin, DPM  Insulin Degludec (TRESIBA FLEXTOUCH) 200 UNIT/ML SOPN Inject 68 Units into the skin daily before breakfast.   Yes Historical Provider, MD  lisinopril (PRINIVIL,ZESTRIL) 40 MG tablet Take 40 mg by mouth daily.     Yes Historical Provider, MD  metoprolol tartrate (LOPRESSOR) 25 MG tablet Take 25 mg by mouth 2 (two) times daily.   Yes Historical Provider, MD  mirabegron ER (MYRBETRIQ) 50 MG TB24 tablet Take 50 mg by mouth daily.   Yes Historical Provider, MD  Multiple Vitamin (MULTIVITAMIN WITH MINERALS) TABS tablet Take 1 tablet by mouth daily.   Yes Historical Provider, MD  albuterol (PROVENTIL,VENTOLIN) 90 MCG/ACT inhaler Inhale 2 puffs into the lungs every 4 (four) hours as needed for wheezing or shortness  of breath.     Historical Provider, MD  esomeprazole (NEXIUM) 40 MG capsule Take 40 mg by mouth daily as needed (acid reflux).     Historical Provider, MD  nitroGLYCERIN (NITROSTAT) 0.4 MG SL tablet Place 0.4 mg under the tongue every 5 (five) minutes as needed for chest pain.    Historical Provider, MD  oxyCODONE (ROXICODONE) 5 MG immediate release tablet Take 1 tablet (5 mg total) by mouth 2 (two) times daily as needed for severe pain. 02/12/15   Everlene Balls, MD     Family History  Problem Relation Age of Onset  . Coronary artery disease    . Hypertension Mother     Social History   Social History  .  Marital Status: Widowed    Spouse Name: N/A  . Number of Children: N/A  . Years of Education: N/A   Social History Main Topics  . Smoking status: Never Smoker   . Smokeless tobacco: Never Used  . Alcohol Use: No  . Drug Use: No  . Sexual Activity: No   Other Topics Concern  . Not on file   Social History Narrative      Review of Systems  Constitutional: Negative for fever and chills.  Respiratory: Positive for shortness of breath. Negative for cough.   Cardiovascular: Negative for chest pain.  Gastrointestinal: Positive for nausea and abdominal pain. Negative for vomiting and blood in stool.  Genitourinary: Negative for dysuria and hematuria.       History of urinary urgency  Musculoskeletal: Positive for back pain.  Neurological: Negative for headaches.    Vital Signs: BP 143/70 mmHg  Pulse 71  Temp(Src) 97.8 F (36.6 C)  Resp 20  SpO2 98%  Physical Exam  Constitutional: She is oriented to person, place, and time. She appears well-developed and well-nourished.  Cardiovascular: Normal rate and regular rhythm.   Pulmonary/Chest: Effort normal.  Slightly decreased breath sounds at bases bilaterally  Abdominal: Soft. Bowel sounds are normal. There is no tenderness.  Musculoskeletal: Normal range of motion. She exhibits no edema.  Neurological: She is alert and oriented to person, place, and time.    Mallampati Score:     Imaging: Mr Abdomen W Wo Contrast  01/30/2015  CLINICAL DATA:  Evaluate right renal lesion seen on recent CT scan. History of left renal cell cancer, status post partial nephrectomy. EXAM: MRI ABDOMEN WITHOUT AND WITH CONTRAST TECHNIQUE: Multiplanar multisequence MR imaging of the abdomen was performed both before and after the administration of intravenous contrast. CONTRAST:  14 cc MultiHance COMPARISON:  CT scans 07/06/2013 and 01/14/2015 FINDINGS: Lower chest: The lung bases are grossly clear. No pleural or pericardial effusion. Hepatobiliary:  8 mm segment 2 liver lesion has MR imaging characteristics of a benign hepatic hemangioma. No worrisome hepatic lesions or intrahepatic biliary dilatation. The gallbladder is normal. No common bile duct dilatation. Pancreas: No mass, inflammation or ductal dilatation. Normal caliber and course of the main pancreatic duct. Spleen: Normal size.  No focal lesions. Adrenals/Urinary Tract: The adrenal glands on are normal. Stable surgical changes involving the left kidney from a prior partial nephrectomy. No findings for residual or recurrent tumor. There is a 22 x 16 mm enhancing collecting system mass on the right side. 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 It could be a renal cell cancer herniating into the renal sinus but I think that is less likely. Urine cytology ease may be helpful. Biopsy may be necessary. No lesions  of the renal parenchyma identified. Stomach/Bowel: The stomach, duodenum, small bowel and colon are grossly normal. No inflammatory changes, mass lesions or obstructive findings. Vascular/Lymphatic: The aorta and branch vessels are patent. The major venous structures are patent. No mesenteric or retroperitoneal mass or lymphadenopathy. Small scattered lymph nodes are noted. These appear stable. Other: No ascites or abdominal wall hernia. No subcutaneous lesions. Musculoskeletal: No significant bony findings. IMPRESSION: 1. 22 x 16 mm enhancing lesion in the right renal sinus most suspicious for a uroepithelial neoplasm. Recommend urine cytology and possible biopsy. 2. No findings for metastatic disease or lymphadenopathy. 3. Stable surgical changes involving the left kidney. No findings for recurrent tumor. 4. Stable 8 mm left hepatic lobe liver lesion consistent with a benign hepatic hemangioma. Electronically Signed   By: Marijo Sanes M.D.   On: 01/30/2015 09:25   Ct Renal Stone Study  02/12/2015  CLINICAL DATA:  Acute onset of worsening right flank  pain and hematuria. Initial encounter. EXAM: CT ABDOMEN AND PELVIS WITHOUT CONTRAST TECHNIQUE: Multidetector CT imaging of the abdomen and pelvis was performed following the standard protocol without IV contrast. COMPARISON:  CT of the abdomen and pelvis performed 01/14/2015, and MRI of the abdomen performed 01/30/2015 FINDINGS: Minimal bibasilar opacities may reflect a mild infectious process or chronic scarring. The liver and spleen are unremarkable in appearance. The gallbladder is within normal limits. The pancreas and adrenal glands are unremarkable. Mild right-sided perinephric stranding is noted. There is mild right-sided pelvicaliectasis, with mild stranding along the course of the right ureter, raising question for mild ureteritis and pyelonephritis. There is mild scarring involving the lower pole of the left kidney. No obstructing ureteral stones are identified. No free fluid is identified. The small bowel is unremarkable in appearance. The stomach is within normal limits. No acute vascular abnormalities are seen. The appendix is grossly normal in caliber, without evidence of appendicitis. The colon is unremarkable in appearance. The bladder is relatively decompressed. A small amount of air is noted within the bladder. This could reflect recent surgery, though would correlate clinically to assess for emphysematous cystitis. No inguinal lymphadenopathy is seen. No acute osseous abnormalities are identified. Facet disease is noted at the lower lumbar spine. IMPRESSION: 1. Mild right-sided perinephric stranding noted. Mild stranding along the course of the right ureter. This raises question for mild ureteritis and pyelonephritis. 2. Small amount of air within the bladder. This could reflect recent surgery, though would correlate clinically to assess for emphysematous cystitis. 3. Persistent minimal bibasilar opacities may reflect a mild infectious process or chronic scarring. Electronically Signed   By:  Garald Balding M.D.   On: 02/12/2015 04:38    Labs:  CBC:  Recent Labs  07/28/14 0657 01/14/15 1639 02/12/15 0240 02/24/15 0715  WBC 5.9 4.3 5.5 4.0  HGB 12.7 13.1 12.5 13.3  HCT 38.3 40.4 37.3 41.2  PLT 219 225 192 237    COAGS:  Recent Labs  07/28/14 0657 02/24/15 0715  INR 0.95 0.91    BMP:  Recent Labs  01/14/15 1639 02/06/15 0845 02/12/15 0240 02/24/15 0715  NA 137 144 142 144  K 4.2 4.2 4.2 3.9  CL 106 110 104 109  CO2 25 25 28 25   GLUCOSE 230* 130* 201* 96  BUN 16 19 20 19   CALCIUM 9.5 9.7 9.6 9.6  CREATININE 1.20* 1.06* 1.34* 1.00  GFRNONAA 43* 50* 37* 53*  GFRAA 50* 58* 43* >60    LIVER FUNCTION TESTS:  Recent Labs  02/12/15  0240  BILITOT 0.6  AST 21  ALT 20  ALKPHOS 78  PROT 7.6  ALBUMIN 4.2    TUMOR MARKERS: No results for input(s): AFPTM, CEA, CA199, CHROMGRNA in the last 8760 hours.  Assessment and Plan: 75 y.o. female with prior history of left renal cell carcinoma and partial nephrectomy in 2010. 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 She presents today for CT-guided biopsy of the right renal lesion.Risks and benefits discussed with the patient including, but not limited to bleeding, infection, damage to adjacent structures or low yield requiring additional tests.All of the patient's questions were answered, patient is agreeable to proceed.Consent signed and in chart.    Thank you for this interesting consult.  I greatly enjoyed meeting [name] and look forward to participating in their care.  A copy of this report was sent to the requesting provider on this date.  Signed: D. Rowe Robert 02/24/2015, 9:08 AM   I spent a total of 15 minutes in face to face in clinical consultation, greater than 50% of which was counseling/coordinating care for CT-guided right renal mass biopsy

## 2015-02-24 NOTE — Sedation Documentation (Signed)
Vital signs stable. 

## 2015-02-27 ENCOUNTER — Other Ambulatory Visit: Payer: Medicare Other

## 2015-02-28 DIAGNOSIS — C641 Malignant neoplasm of right kidney, except renal pelvis: Secondary | ICD-10-CM | POA: Diagnosis not present

## 2015-02-28 DIAGNOSIS — Z0000 Encounter for general adult medical examination without abnormal findings: Secondary | ICD-10-CM | POA: Diagnosis not present

## 2015-03-02 ENCOUNTER — Other Ambulatory Visit: Payer: Self-pay | Admitting: Urology

## 2015-03-06 ENCOUNTER — Ambulatory Visit
Admission: RE | Admit: 2015-03-06 | Discharge: 2015-03-06 | Disposition: A | Discharge status: Home / Self Care | Payer: Medicare Other | Source: Ambulatory Visit | Attending: Family Medicine | Admitting: Family Medicine

## 2015-03-06 DIAGNOSIS — N644 Mastodynia: Secondary | ICD-10-CM

## 2015-03-16 ENCOUNTER — Encounter (HOSPITAL_COMMUNITY): Payer: Self-pay

## 2015-03-16 ENCOUNTER — Encounter (HOSPITAL_COMMUNITY)
Admission: RE | Admit: 2015-03-16 | Discharge: 2015-03-16 | Disposition: A | Discharge status: Home / Self Care | Payer: Medicare Other | Source: Ambulatory Visit | Attending: Urology | Admitting: Urology

## 2015-03-16 DIAGNOSIS — Z0183 Encounter for blood typing: Secondary | ICD-10-CM | POA: Diagnosis not present

## 2015-03-16 DIAGNOSIS — Z01812 Encounter for preprocedural laboratory examination: Secondary | ICD-10-CM | POA: Diagnosis not present

## 2015-03-16 DIAGNOSIS — C641 Malignant neoplasm of right kidney, except renal pelvis: Secondary | ICD-10-CM | POA: Diagnosis not present

## 2015-03-16 HISTORY — DX: Dizziness and giddiness: R42

## 2015-03-16 HISTORY — DX: Unspecified osteoarthritis, unspecified site: M19.90

## 2015-03-16 HISTORY — DX: Paresthesia of skin: R20.2

## 2015-03-16 HISTORY — DX: Nocturia: R35.1

## 2015-03-16 HISTORY — DX: Frequency of micturition: R35.0

## 2015-03-16 HISTORY — DX: Personal history of urinary (tract) infections: Z87.440

## 2015-03-16 HISTORY — DX: Tinnitus, unspecified ear: H93.19

## 2015-03-16 HISTORY — DX: Unspecified urinary incontinence: R32

## 2015-03-16 LAB — BASIC METABOLIC PANEL
ANION GAP: 8 (ref 5–15)
BUN: 18 mg/dL (ref 6–20)
CALCIUM: 9.2 mg/dL (ref 8.9–10.3)
CHLORIDE: 109 mmol/L (ref 101–111)
CO2: 23 mmol/L (ref 22–32)
Creatinine, Ser: 1.04 mg/dL — ABNORMAL HIGH (ref 0.44–1.00)
GFR calc non Af Amer: 51 mL/min — ABNORMAL LOW (ref >60)
GFR, EST AFRICAN AMERICAN: 59 mL/min — AB (ref >60)
GLUCOSE: 187 mg/dL — AB (ref 65–99)
Potassium: 4.4 mmol/L (ref 3.5–5.1)
Sodium: 140 mmol/L (ref 135–145)

## 2015-03-16 LAB — CBC
HEMATOCRIT: 41 % (ref 36.0–46.0)
HEMOGLOBIN: 13 g/dL (ref 12.0–15.0)
MCH: 29.4 pg (ref 26.0–34.0)
MCHC: 31.7 g/dL (ref 30.0–36.0)
MCV: 92.8 fL (ref 78.0–100.0)
Platelets: 221 10*3/uL (ref 150–400)
RBC: 4.42 MIL/uL (ref 3.87–5.11)
RDW: 13.9 % (ref 11.5–15.5)
WBC: 3.8 10*3/uL — ABNORMAL LOW (ref 4.0–10.5)

## 2015-03-16 NOTE — Progress Notes (Signed)
Cardiac cath report/epic 07/28/2014 Aortic arch angiography report / epic 07/28/2014 CXR / epic 01/14/2015 EKG/epic 07/28/2014 ECHO report / epic under media section 02/11/2015 (noted as cardiovascular) A1C results in epic 02/06/2015 10.2 results routed to [facility]

## 2015-03-16 NOTE — Patient Instructions (Addendum)
[name]  03/16/2015   Your procedure is scheduled on: Monday March 20, 2015  Report to [facility]  Entrance take [place]  elevators to 3rd floor to  [place] at 9:00 AM.  Call this number if you have problems the morning of surgery [phone]   Remember: ONLY 1 PERSON MAY GO WITH YOU TO SHORT STAY TO GET  READY MORNING OF [place].  Do not eat food or drink liquids :After Midnight.     Take these medicines the morning of surgery with A SIP OF WATER: Amlodipine (Norvasc); Nexium if needed; Metoprolol; May use Albuterol Inhaler if needed (bring with you day of surgery)  DO NOT TAKE ANY DIABETIC MEDICATIONS DAY OF YOUR SURGERY                               You may not have any metal on your body including hair pins and              piercings  Do not wear jewelry, make-up, lotions, powders or perfumes, deodorant             Do not wear nail polish.  Do not shave  48 hours prior to surgery.              Do not bring valuables to the hospital. [place].  Contacts, dentures or bridgework may not be worn into surgery.  Leave suitcase in the car. After surgery it may be brought to your room.      Special Instructions: MAGNESIUM CITRATE 8-10 OZ BY NOON DAY BEFORE SUGERY; FLEETS ENEMA NIGHT PRIOR TO SURGERY; FOLLOW COMPLETE BOWEL PREPARATION INSTRUCTION / SURGEON PRIOR TO SURGICAL DATE              Please read over the following fact sheets you were given:INCENTIVE SPIROMETER; BLOOD TRANSFUSION INFORMATION SHEET  _____________________________________________________________________             [facility] - Preparing for Surgery Before surgery, you can play an important role.  Because skin is not sterile, your skin needs to be as free of germs as possible.  You can reduce the number of germs on your skin by washing with CHG (chlorahexidine gluconate) soap before surgery.  CHG is an antiseptic  cleaner which kills germs and bonds with the skin to continue killing germs even after washing. Please DO NOT use if you have an allergy to CHG or antibacterial soaps.  If your skin becomes reddened/irritated stop using the CHG and inform your nurse when you arrive at Short Stay. Do not shave (including legs and underarms) for at least 48 hours prior to the first CHG shower.  You may shave your face/neck. Please follow these instructions carefully:  1.  Shower with CHG Soap the night before surgery and the  morning of Surgery.  2.  If you choose to wash your hair, wash your hair first as usual with your  normal  shampoo.  3.  After you shampoo, rinse your hair and body thoroughly to remove the  shampoo.                           4.  Use CHG as you would any other liquid soap.  You can apply chg directly  to the skin and wash                       Gently with a scrungie or clean washcloth.  5.  Apply the CHG Soap to your body ONLY FROM THE NECK DOWN.   Do not use on face/ open                           Wound or open sores. Avoid contact with eyes, ears mouth and genitals (private parts).                       Wash face,  Genitals (private parts) with your normal soap.             6.  Wash thoroughly, paying special attention to the area where your surgery  will be performed.  7.  Thoroughly rinse your body with warm water from the neck down.  8.  DO NOT shower/wash with your normal soap after using and rinsing off  the CHG Soap.                9.  Pat yourself dry with a clean towel.            10.  Wear clean pajamas.            11.  Place clean sheets on your bed the night of your first shower and do not  sleep with pets. Day of Surgery : Do not apply any lotions/deodorants the morning of surgery.  Please wear clean clothes to the hospital/surgery center.  FAILURE TO FOLLOW THESE INSTRUCTIONS MAY RESULT IN THE CANCELLATION OF YOUR SURGERY PATIENT  SIGNATURE_________________________________  NURSE SIGNATURE__________________________________  ________________________________________________________________________   Adam Phenix  An incentive spirometer is a tool that can help keep your lungs clear and active. This tool measures how well you are filling your lungs with each breath. Taking long deep breaths may help reverse or decrease the chance of developing breathing (pulmonary) problems (especially infection) following:  A long period of time when you are unable to move or be active. BEFORE THE PROCEDURE   If the spirometer includes an indicator to show your best effort, your nurse or respiratory therapist will set it to a desired goal.  If possible, sit up straight or lean slightly forward. Try not to slouch.  Hold the incentive spirometer in an upright position. INSTRUCTIONS FOR USE   Sit on the edge of your bed if possible, or sit up as far as you can in bed or on a chair.  Hold the incentive spirometer in an upright position.  Breathe out normally.  Place the mouthpiece in your mouth and seal your lips tightly around it.  Breathe in slowly and as deeply as possible, raising the piston or the ball toward the top of the column.  Hold your breath for 3-5 seconds or for as long as possible. Allow the piston or ball to fall to the bottom of the column.  Remove the mouthpiece from your mouth and breathe out normally.  Rest for a few seconds and repeat Steps 1 through 7 at least 10 times every 1-2 hours when you are awake. Take your time and take a few normal breaths between deep breaths.  The spirometer may include an indicator to show your best  effort. Use the indicator as a goal to work toward during each repetition.  After each set of 10 deep breaths, practice coughing to be sure your lungs are clear. If you have an incision (the cut made at the time of surgery), support your incision when coughing by placing a  pillow or rolled up towels firmly against it. Once you are able to get out of bed, walk around indoors and cough well. You may stop using the incentive spirometer when instructed by your caregiver.  RISKS AND COMPLICATIONS  Take your time so you do not get dizzy or light-headed.  If you are in pain, you may need to take or ask for pain medication before doing incentive spirometry. It is harder to take a deep breath if you are having pain. AFTER USE  Rest and breathe slowly and easily.  It can be helpful to keep track of a log of your progress. Your caregiver can provide you with a simple table to help with this. If you are using the spirometer at home, follow these instructions: [place] IF:   You are having difficultly using the spirometer.  You have trouble using the spirometer as often as instructed.  Your pain medication is not giving enough relief while using the spirometer.  You develop fever of 100.5 F (38.1 C) or higher. SEEK IMMEDIATE MEDICAL CARE IF:   You cough up bloody sputum that had not been present before.  You develop fever of 102 F (38.9 C) or greater.  You develop worsening pain at or near the incision site. MAKE SURE YOU:   Understand these instructions.  Will watch your condition.  Will get help right away if you are not doing well or get worse. Document Released: 06/17/2006 Document Revised: 04/29/2011 Document Reviewed: 08/18/2006 Exit[employer] Patient Information 2014 [employer], [place].   ________________________________________________________________________  WHAT IS A BLOOD TRANSFUSION? Blood Transfusion Information  A transfusion is the replacement of blood or some of its parts. Blood is made up of multiple cells which provide different functions.  Red blood cells carry oxygen and are used for blood loss replacement.  White blood cells fight against infection.  Platelets control bleeding.  Plasma helps clot blood.  Other blood  products are available for specialized needs, such as hemophilia or other clotting disorders. BEFORE THE TRANSFUSION  Who gives blood for transfusions?   Healthy volunteers who are fully evaluated to make sure their blood is safe. This is blood bank blood. Transfusion therapy is the safest it has ever been in the practice of medicine. Before blood is taken from a donor, a complete history is taken to make sure that person has no history of diseases nor engages in risky social behavior (examples are intravenous drug use or sexual activity with multiple partners). The donor's travel history is screened to minimize risk of transmitting infections, such as malaria. The donated blood is tested for signs of infectious diseases, such as HIV and hepatitis. The blood is then tested to be sure it is compatible with you in order to minimize the chance of a transfusion reaction. If you or a relative donates blood, this is often done in anticipation of surgery and is not appropriate for emergency situations. It takes many days to process the donated blood. RISKS AND COMPLICATIONS Although transfusion therapy is very safe and saves many lives, the main dangers of transfusion include:   Getting an infectious disease.  Developing a transfusion reaction. This is an allergic reaction to something in  the blood you were given. Every precaution is taken to prevent this. The decision to have a blood transfusion has been considered carefully by your caregiver before blood is given. Blood is not given unless the benefits outweigh the risks. AFTER THE TRANSFUSION  Right after receiving a blood transfusion, you will usually feel much better and more energetic. This is especially true if your red blood cells have gotten low (anemic). The transfusion raises the level of the red blood cells which carry oxygen, and this usually causes an energy increase.  The nurse administering the transfusion will monitor you carefully for  complications. HOME CARE INSTRUCTIONS  No special instructions are needed after a transfusion. You may find your energy is better. Speak with your caregiver about any limitations on activity for underlying diseases you may have. SEEK MEDICAL CARE IF:   Your condition is not improving after your transfusion.  You develop redness or irritation at the intravenous (IV) site. SEEK IMMEDIATE MEDICAL CARE IF:  Any of the following symptoms occur over the next 12 hours:  Shaking chills.  You have a temperature by mouth above 102 F (38.9 C), not controlled by medicine.  Chest, back, or muscle pain.  People around you feel you are not acting correctly or are confused.  Shortness of breath or difficulty breathing.  Dizziness and fainting.  You get a rash or develop hives.  You have a decrease in urine output.  Your urine turns a dark color or changes to pink, red, or brown. Any of the following symptoms occur over the next 10 days:  You have a temperature by mouth above 102 F (38.9 C), not controlled by medicine.  Shortness of breath.  Weakness after normal activity.  The white part of the eye turns yellow (jaundice).  You have a decrease in the amount of urine or are urinating less often.  Your urine turns a dark color or changes to pink, red, or brown. Document Released: 02/02/2000 Document Revised: 04/29/2011 Document Reviewed: 09/21/2007 [facility] Patient Information 2014 [place], [place].  _______________________________________________________________________

## 2015-03-17 NOTE — H&P (Signed)
History of Present Illness [name] is a 75 year old with the following urologic history:    1) Renal cell carcinoma: She is s/p a left robotic assisted laparoscopic partial nephrectomy on October 06, 2008. Her tumor was incidentally detected on a CT scan performed for lower abdominal pain.     TNM stage: pT1a Nx Mx  Histology: Chromophobe  Fuhrman grade: III/IV  Surgical margins: Negative    2) Right renal neoplasm: She was incidentally found to have a new 2.2 cm enhancing central right renal mass in December 2016. Right ureteroscopy was unremarkable for any urothelial tumor.    3) LUTS/incontinence: She has a history of urinary urgency and frequency with urge incontinence and hesitancy.    Current treatment: Myrbetriq 25 mg, Timed voiding    Interval history:    She follows up today for further discussion following her recent right ureteroscopy and subsequent right renal mass biopsy. On ureteroscopy, she was not noted to have any urothelial abnormalities. She did develop some right-sided flank pain following her ureteroscopy and presented to the emergency department on Christmas day. This was likely related to ureteral edema and subsequently resolved. Based on her negative urothelial evaluation, she did undergo a percutaneous biopsy of her right renal mass by Dr. Kathlene Cote last week. Biopsy results did confirm a Fuhrman grade 3, chromophobe renal cell carcinoma. This is similar histology to her contralateral tumor that was removed in 2010. She tolerated her biopsy procedure well and has had no complications.   Past Medical History Problems  1. History of Apnea 2. History of Asthma (J45.909) 3. History of diabetes mellitus (Z86.39) 4. History of heartburn (Z87.898) 5. History of hypercholesterolemia (Z86.39) 6. History of hypertension (Z86.79)  Surgical History Problems  1. History of Appendectomy 2. History of Back Surgery 3. History of Cystoscopy For Urethral  Stricture 4. History of Cystoscopy With Ureteroscopy Right 5. History of Hysterectomy 6. History of Kidney Surgery Laparoscopic Partial Nephrectomy  Current Meds 1. AmLODIPine Besylate 2.5 MG Oral Tablet;  Therapy: 25Apr2012 to Recorded 2. Aspirin 81 MG TABS;  Therapy: (Recorded:26Jul2010) to Recorded 3. Atorvastatin Calcium 10 MG Oral Tablet;  Therapy: (Recorded:28Jun2016) to Recorded 4. Gabapentin 300 MG Oral Capsule;  Therapy: (Recorded:09Dec2015) to Recorded 5. Lisinopril 40 MG Oral Tablet;  Therapy: (Recorded:12Sep2012) to Recorded 6. Metoprolol Tartrate 25 MG Oral Tablet;  Therapy: (Recorded:09Dec2015) to Recorded 7. Multi-Day Vitamins TABS;  Therapy: (Recorded:19Jul2016) to Recorded 8. Myrbetriq 25 MG Oral Tablet Extended Release 24 Hour;  Therapy: (Recorded:02Dec2016) to Recorded 9. Nitrostat 0.4 MG Sublingual Tablet Sublingual;  Therapy: (Recorded:26Jul2010) to Recorded 10. Tyler Aas FlexTouch 200 UNIT/ML Subcutaneous Solution Pen-injector;   Therapy: (Recorded:09Nov2016) to Recorded 11. Ventolin AERS;   Therapy: (Recorded:11Sep2012) to Recorded 12. Vitamin D-3 TABS;   Therapy: (Recorded:19Jul2016) to Recorded  Allergies Medication  1. Cephalexin CAPS 2. Sulfa Drugs  Family History Problems  1. Family history of Hypertension 2. Denied: Family history of Kidney Cancer 3. Family history of Transient Ischemic Attack : Mother  Social History Problems  1. Denied: History of Alcohol Use (History) 2. Marital History - Single 3. Never A Smoker 4. Denied: History of Tobacco Use  Review of Systems  Genitourinary: no hematuria.  Constitutional: no fever, no night sweats and no recent weight loss.  Hematologic/Lymphatic: no swollen glands.    Vitals Vital Signs [Data Includes: Last 1 Day]  Recorded: [id number] 11:00AM  Blood Pressure: 142 / 83 Heart Rate: 80  Physical Exam Constitutional: Well nourished and well developed . No acute distress.  ENT:. The ears  and nose are normal in appearance.  Neck: The appearance of the neck is normal and no neck mass is present.  Pulmonary: No respiratory distress, normal respiratory rhythm and effort and clear bilateral breath sounds.  Cardiovascular: Heart rate and rhythm are normal . No peripheral edema.  Abdomen: Incision site(s) well healed. The abdomen is soft and nontender. No masses are palpated. No CVA tenderness. No hernias are palpable. No hepatosplenomegaly noted.  Lymphatics: The supraclavicular, femoral and inguinal nodes are not enlarged or tender.  Skin: Normal skin turgor, no visible rash and no visible skin lesions.  Neuro/Psych:. Mood and affect are appropriate.    Results/Data Selected Results  UA With REFLEX [id number] 10:45AM Raynelle Bring  SPECIMEN TYPE: CLEAN CATCH   Test Name Result Flag Reference  COLOR YELLOW  YELLOW  ** PLEASE NOTE CHANGE IN UNIT OF MEASURE AND REFERENCE RANGE(S). **  APPEARANCE CLEAR  CLEAR  SPECIFIC GRAVITY 1.025  1.001-1.035  pH 5.5  5.0-8.0  GLUCOSE 2+ A NEGATIVE  BILIRUBIN NEGATIVE  NEGATIVE  KETONE NEGATIVE  NEGATIVE  BLOOD NEGATIVE  NEGATIVE  PROTEIN NEGATIVE  NEGATIVE  NITRITE NEGATIVE  NEGATIVE  LEUKOCYTE ESTERASE TRACE A NEGATIVE  SQUAMOUS EPITHELIAL/HPF 0-5 HPF  <=5  TRANSITIONAL EPITHELIAL CELLS 0-5                    <=5           HPF  WBC 0-5 WBC/HPF  <=5  RBC 0-2 RBC/HPF  <=2  BACTERIA NONE SEEN HPF  NONE SEEN  CRYSTALS NONE SEEN HPF  NONE SEEN  CASTS NONE SEEN LPF  NONE SEEN  Yeast NONE SEEN HPF  NONE SEEN    I have reviewed the following studies:    Her chest x-ray from Feb 01, 2023 demonstrated no evidence of pulmonary masses or metastatic disease.    Her MRI demonstrates a centrally located 2.2 cm enhancing mass. Her percutaneous biopsy did confirm Furhman grade 3, chromophobe renal cell carcinoma as previously stated.  Assessment Assessed  1. Renal cell carcinoma, right (C64.1)  Plan Renal cell carcinoma, right  1.  Follow-up Office  Follow-up  Status: Hold For - Date of Service  Requested for:  [id number]  Discussion/Summary 1. Renal cell carcinoma of the right kidney: We discussed her biopsy results which confirmed renal cell carcinoma. Considering these findings, we discussed optimal treatment which would be surgical resection. However, based on the central location of the mass, she understands that this would require radical nephrectomy. I did review her imaging studies in detail today. Fortunately, despite having a prior left partial nephrectomy, she does have very good residual functional renal mass on the left side and her baseline renal function is normal. She therefore is certainly a candidate to proceed with a right radical nephrectomy that could be performed laparoscopically. Her overall health is such that she also is a reasonable candidate to proceed with surgical treatment.    She understands that an alternative would be percutaneous ablation although her masses not ideally located considering the central location. This could result in under treatment due to heat sink effect or possible renal vascular damage. However, this may be an option should she refuse surgery. Finally, we discussed the option of surveillance. Although this is a reasonable consideration at age 79, this mass has grown significantly since her prior imaging study and in fact was not very well visualized in 2013 at all. Based on the grade of her tumor, I did  recommend treatment as a better option compared to surveillance.   The patient was provided information regarding their renal mass including the relative risk of benign versus malignant pathology and the natural history of renal cell carcinoma and other possible malignancies of the kidney. The role of renal biopsy, laboratory testing, and imaging studies to further characterize renal masses and/or the presence of metastatic disease were explained. We discussed the role of active  surveillance, surgical therapy with both radical nephrectomy and nephron-sparing surgery, and ablative therapy in the treatment of renal masses. In addition, we discussed our goals of providing an accurate diagnosis and oncologic control while maintaining optimal renal function as appropriate based on the size, location, and complexity of their renal mass as well as their co-morbidities.    We have discussed the risks of treatment in detail including but not limited to bleeding, infection, heart attack, stroke, death, venothromoboembolism, cancer recurrence, injury/damage to surrounding organs and structures, urine leak, the possibility of open surgical conversion for patients undergoing minimally invasive surgery, the risk of developing chronic kidney disease and its associated implications, and the potential risk of end stage renal disease possibly necessitating dialysis.     After a detailed discussion, Ms. Choi would like to proceed with a right laparoscopic radical nephrectomy. She feels well-informed and uterus are informed consent to proceed. I also called her daughter, Marylouise Stacks, this morning and reviewed the findings from her biopsy and our plan for treatment.    Cc: Dr. [name]  A total of 42 minutes were spent in the overall care of the patient today with 35 minutes in direct face to face consultation.    Signatures Electronically signed by : Raynelle Bring, M.D.; Feb 28 2015  1:41PM EST

## 2015-03-19 MED ORDER — VANCOMYCIN HCL IN DEXTROSE 1-5 GM/200ML-% IV SOLN
1000.0000 mg | INTRAVENOUS | Status: AC
  Administered 2015-03-20: 1000 mg via INTRAVENOUS
  Filled 2015-03-19 (×2): qty 200

## 2015-03-20 ENCOUNTER — Inpatient Hospital Stay (HOSPITAL_COMMUNITY)
Admission: RE | Admit: 2015-03-20 | Discharge: 2015-03-23 | DRG: 658 | Disposition: A | Discharge status: Home / Self Care | Payer: Medicare Other | Source: Ambulatory Visit | Attending: Urology | Admitting: Urology

## 2015-03-20 ENCOUNTER — Encounter (HOSPITAL_COMMUNITY)
Admission: RE | Disposition: A | Discharge status: Home / Self Care | Payer: Self-pay | Source: Ambulatory Visit | Attending: Urology

## 2015-03-20 ENCOUNTER — Inpatient Hospital Stay (HOSPITAL_COMMUNITY): Payer: Medicare Other | Admitting: Anesthesiology

## 2015-03-20 ENCOUNTER — Encounter (HOSPITAL_COMMUNITY): Payer: Self-pay | Admitting: Anesthesiology

## 2015-03-20 DIAGNOSIS — Z90710 Acquired absence of both cervix and uterus: Secondary | ICD-10-CM | POA: Diagnosis not present

## 2015-03-20 DIAGNOSIS — E7800 Pure hypercholesterolemia, unspecified: Secondary | ICD-10-CM | POA: Diagnosis present

## 2015-03-20 DIAGNOSIS — Z794 Long term (current) use of insulin: Secondary | ICD-10-CM

## 2015-03-20 DIAGNOSIS — J45909 Unspecified asthma, uncomplicated: Secondary | ICD-10-CM | POA: Diagnosis present

## 2015-03-20 DIAGNOSIS — G4730 Sleep apnea, unspecified: Secondary | ICD-10-CM | POA: Diagnosis present

## 2015-03-20 DIAGNOSIS — R350 Frequency of micturition: Secondary | ICD-10-CM | POA: Diagnosis present

## 2015-03-20 DIAGNOSIS — T402X5A Adverse effect of other opioids, initial encounter: Secondary | ICD-10-CM | POA: Diagnosis not present

## 2015-03-20 DIAGNOSIS — C641 Malignant neoplasm of right kidney, except renal pelvis: Principal | ICD-10-CM | POA: Diagnosis present

## 2015-03-20 DIAGNOSIS — I10 Essential (primary) hypertension: Secondary | ICD-10-CM | POA: Diagnosis present

## 2015-03-20 DIAGNOSIS — Z8249 Family history of ischemic heart disease and other diseases of the circulatory system: Secondary | ICD-10-CM

## 2015-03-20 DIAGNOSIS — Z01812 Encounter for preprocedural laboratory examination: Secondary | ICD-10-CM

## 2015-03-20 DIAGNOSIS — Z905 Acquired absence of kidney: Secondary | ICD-10-CM | POA: Diagnosis not present

## 2015-03-20 DIAGNOSIS — Z9049 Acquired absence of other specified parts of digestive tract: Secondary | ICD-10-CM | POA: Diagnosis not present

## 2015-03-20 DIAGNOSIS — L299 Pruritus, unspecified: Secondary | ICD-10-CM | POA: Diagnosis not present

## 2015-03-20 DIAGNOSIS — E119 Type 2 diabetes mellitus without complications: Secondary | ICD-10-CM | POA: Diagnosis present

## 2015-03-20 DIAGNOSIS — Z7982 Long term (current) use of aspirin: Secondary | ICD-10-CM

## 2015-03-20 DIAGNOSIS — N3941 Urge incontinence: Secondary | ICD-10-CM | POA: Diagnosis present

## 2015-03-20 DIAGNOSIS — Z79899 Other long term (current) drug therapy: Secondary | ICD-10-CM | POA: Diagnosis not present

## 2015-03-20 HISTORY — PX: LAPAROSCOPIC NEPHRECTOMY: SHX1930

## 2015-03-20 LAB — BASIC METABOLIC PANEL
Anion gap: 8 (ref 5–15)
BUN: 10 mg/dL (ref 6–20)
CO2: 23 mmol/L (ref 22–32)
CREATININE: 1.2 mg/dL — AB (ref 0.44–1.00)
Calcium: 8.7 mg/dL — ABNORMAL LOW (ref 8.9–10.3)
Chloride: 109 mmol/L (ref 101–111)
GFR calc Af Amer: 50 mL/min — ABNORMAL LOW (ref >60)
GFR, EST NON AFRICAN AMERICAN: 43 mL/min — AB (ref >60)
Glucose, Bld: 92 mg/dL (ref 65–99)
Potassium: 4.1 mmol/L (ref 3.5–5.1)
SODIUM: 140 mmol/L (ref 135–145)

## 2015-03-20 LAB — GLUCOSE, CAPILLARY
Glucose-Capillary: 98 mg/dL (ref 65–99)
Glucose-Capillary: 89 mg/dL (ref 65–99)
Glucose-Capillary: 135 mg/dL — ABNORMAL HIGH (ref 65–99)
Glucose-Capillary: 177 mg/dL — ABNORMAL HIGH (ref 65–99)

## 2015-03-20 LAB — TYPE AND SCREEN
ABO/RH(D): O POS
Antibody Screen: NEGATIVE

## 2015-03-20 LAB — HEMOGLOBIN AND HEMATOCRIT, BLOOD
HCT: 39.5 % (ref 36.0–46.0)
HEMOGLOBIN: 12.7 g/dL (ref 12.0–15.0)

## 2015-03-20 SURGERY — NEPHRECTOMY, RADICAL, LAPAROSCOPIC, ADULT
Anesthesia: General | Laterality: Right

## 2015-03-20 MED ORDER — ONDANSETRON HCL 4 MG/2ML IJ SOLN
INTRAMUSCULAR | Status: DC | PRN
  Administered 2015-03-20: 4 mg via INTRAVENOUS

## 2015-03-20 MED ORDER — MAGNESIUM CITRATE PO SOLN
1.0000 | Freq: Once | ORAL | Status: DC
  Filled 2015-03-20: qty 296

## 2015-03-20 MED ORDER — LACTATED RINGERS IV SOLN
INTRAVENOUS | Status: DC | PRN
  Administered 2015-03-20 (×2): via INTRAVENOUS

## 2015-03-20 MED ORDER — CEFAZOLIN SODIUM 1-5 GM-% IV SOLN
1.0000 g | Freq: Three times a day (TID) | INTRAVENOUS | Status: AC
  Administered 2015-03-20 – 2015-03-21 (×2): 1 g via INTRAVENOUS
  Filled 2015-03-20 (×2): qty 50

## 2015-03-20 MED ORDER — GLYCOPYRROLATE 0.2 MG/ML IJ SOLN
INTRAMUSCULAR | Status: DC | PRN
  Administered 2015-03-20: 0.2 mg via INTRAVENOUS

## 2015-03-20 MED ORDER — LIDOCAINE HCL (CARDIAC) 20 MG/ML IV SOLN
INTRAVENOUS | Status: DC | PRN
  Administered 2015-03-20: 100 mg via INTRAVENOUS

## 2015-03-20 MED ORDER — DOCUSATE SODIUM 100 MG PO CAPS
100.0000 mg | ORAL_CAPSULE | Freq: Two times a day (BID) | ORAL | Status: DC
  Administered 2015-03-20 – 2015-03-23 (×6): 100 mg via ORAL
  Filled 2015-03-20 (×7): qty 1

## 2015-03-20 MED ORDER — DEXAMETHASONE SODIUM PHOSPHATE 10 MG/ML IJ SOLN
INTRAMUSCULAR | Status: DC | PRN
  Administered 2015-03-20: 8 mg via INTRAVENOUS

## 2015-03-20 MED ORDER — NITROGLYCERIN 0.4 MG SL SUBL: 0.4000 mg | SUBLINGUAL_TABLET | SUBLINGUAL | Status: DC | PRN

## 2015-03-20 MED ORDER — HYDROMORPHONE HCL 2 MG/ML IJ SOLN
INTRAMUSCULAR | Status: AC
  Filled 2015-03-20: qty 1

## 2015-03-20 MED ORDER — FLEET ENEMA 7-19 GM/118ML RE ENEM: 1.0000 | ENEMA | Freq: Once | RECTAL | Status: DC

## 2015-03-20 MED ORDER — ONDANSETRON HCL 4 MG/2ML IJ SOLN
4.0000 mg | INTRAMUSCULAR | Status: DC | PRN
  Administered 2015-03-20: 4 mg via INTRAVENOUS
  Filled 2015-03-20: qty 2

## 2015-03-20 MED ORDER — DEXAMETHASONE SODIUM PHOSPHATE 10 MG/ML IJ SOLN
INTRAMUSCULAR | Status: AC
  Filled 2015-03-20: qty 1

## 2015-03-20 MED ORDER — SUGAMMADEX SODIUM 200 MG/2ML IV SOLN
INTRAVENOUS | Status: AC
  Filled 2015-03-20: qty 2

## 2015-03-20 MED ORDER — BOOST / RESOURCE BREEZE PO LIQD
1.0000 | Freq: Three times a day (TID) | ORAL | Status: DC
  Administered 2015-03-20: 1 via ORAL

## 2015-03-20 MED ORDER — ALBUTEROL SULFATE HFA 108 (90 BASE) MCG/ACT IN AERS
INHALATION_SPRAY | RESPIRATORY_TRACT | Status: DC | PRN
  Administered 2015-03-20: 4 via RESPIRATORY_TRACT

## 2015-03-20 MED ORDER — ATORVASTATIN CALCIUM 10 MG PO TABS
10.0000 mg | ORAL_TABLET | Freq: Every morning | ORAL | Status: DC
  Administered 2015-03-21 – 2015-03-23 (×3): 10 mg via ORAL
  Filled 2015-03-20 (×5): qty 1

## 2015-03-20 MED ORDER — ALBUTEROL SULFATE HFA 108 (90 BASE) MCG/ACT IN AERS
INHALATION_SPRAY | RESPIRATORY_TRACT | Status: AC
  Filled 2015-03-20: qty 6.7

## 2015-03-20 MED ORDER — ONDANSETRON HCL 4 MG/2ML IJ SOLN
INTRAMUSCULAR | Status: AC
  Filled 2015-03-20: qty 2

## 2015-03-20 MED ORDER — BUPIVACAINE LIPOSOME 1.3 % IJ SUSP
20.0000 mL | Freq: Once | INTRAMUSCULAR | Status: AC
  Administered 2015-03-20: 20 mL
  Filled 2015-03-20: qty 20

## 2015-03-20 MED ORDER — SODIUM CHLORIDE 0.45 % IV SOLN
INTRAVENOUS | Status: DC
  Administered 2015-03-20: 15:00:00 via INTRAVENOUS

## 2015-03-20 MED ORDER — EPHEDRINE SULFATE 50 MG/ML IJ SOLN
INTRAMUSCULAR | Status: DC | PRN
  Administered 2015-03-20: 5 mg via INTRAVENOUS

## 2015-03-20 MED ORDER — METOCLOPRAMIDE HCL 5 MG/ML IJ SOLN
INTRAMUSCULAR | Status: DC | PRN
  Administered 2015-03-20: 10 mg via INTRAVENOUS

## 2015-03-20 MED ORDER — SODIUM CHLORIDE 0.9 % IJ SOLN
INTRAMUSCULAR | Status: AC
  Filled 2015-03-20: qty 10

## 2015-03-20 MED ORDER — METOPROLOL TARTRATE 25 MG PO TABS
25.0000 mg | ORAL_TABLET | Freq: Two times a day (BID) | ORAL | Status: DC
  Administered 2015-03-20 – 2015-03-23 (×6): 25 mg via ORAL
  Filled 2015-03-20 (×7): qty 1

## 2015-03-20 MED ORDER — HYDROMORPHONE HCL 1 MG/ML IJ SOLN
INTRAMUSCULAR | Status: DC | PRN
  Administered 2015-03-20: .2 mg via INTRAVENOUS
  Administered 2015-03-20 (×2): .4 mg via INTRAVENOUS

## 2015-03-20 MED ORDER — INSULIN ASPART 100 UNIT/ML ~~LOC~~ SOLN
0.0000 [IU] | SUBCUTANEOUS | Status: DC
  Administered 2015-03-20: 2 [IU] via SUBCUTANEOUS
  Administered 2015-03-20: 3 [IU] via SUBCUTANEOUS
  Administered 2015-03-21: 15 [IU] via SUBCUTANEOUS
  Administered 2015-03-21: 3 [IU] via SUBCUTANEOUS
  Administered 2015-03-21 (×4): 2 [IU] via SUBCUTANEOUS
  Administered 2015-03-22 (×3): 3 [IU] via SUBCUTANEOUS
  Administered 2015-03-23: 5 [IU] via SUBCUTANEOUS
  Administered 2015-03-23: 3 [IU] via SUBCUTANEOUS

## 2015-03-20 MED ORDER — SODIUM CHLORIDE 0.9 % IJ SOLN
INTRAMUSCULAR | Status: AC
  Filled 2015-03-20: qty 20

## 2015-03-20 MED ORDER — ALBUTEROL 90 MCG/ACT IN AERS: 2.0000 | INHALATION_SPRAY | RESPIRATORY_TRACT | Status: DC | PRN

## 2015-03-20 MED ORDER — HYDROMORPHONE HCL 1 MG/ML IJ SOLN
0.2500 mg | INTRAMUSCULAR | Status: DC | PRN
  Administered 2015-03-20 (×2): 0.5 mg via INTRAVENOUS

## 2015-03-20 MED ORDER — LACTATED RINGERS IV SOLN
INTRAVENOUS | Status: DC
Start: 2015-03-20 — End: 2015-03-21
  Administered 2015-03-20: 09:00:00 via INTRAVENOUS

## 2015-03-20 MED ORDER — METOCLOPRAMIDE HCL 5 MG/ML IJ SOLN
INTRAMUSCULAR | Status: AC
  Filled 2015-03-20: qty 2

## 2015-03-20 MED ORDER — SUCCINYLCHOLINE CHLORIDE 20 MG/ML IJ SOLN
INTRAMUSCULAR | Status: DC | PRN
  Administered 2015-03-20: 100 mg via INTRAVENOUS

## 2015-03-20 MED ORDER — LIDOCAINE HCL (CARDIAC) 20 MG/ML IV SOLN
INTRAVENOUS | Status: AC
  Filled 2015-03-20: qty 5

## 2015-03-20 MED ORDER — SUGAMMADEX SODIUM 200 MG/2ML IV SOLN
INTRAVENOUS | Status: DC | PRN
  Administered 2015-03-20: 200 mg via INTRAVENOUS

## 2015-03-20 MED ORDER — ROCURONIUM BROMIDE 100 MG/10ML IV SOLN
INTRAVENOUS | Status: DC | PRN
  Administered 2015-03-20: 10 mg via INTRAVENOUS
  Administered 2015-03-20: 40 mg via INTRAVENOUS

## 2015-03-20 MED ORDER — DIPHENHYDRAMINE HCL 12.5 MG/5ML PO ELIX
12.5000 mg | ORAL_SOLUTION | Freq: Four times a day (QID) | ORAL | Status: DC | PRN
Start: 2015-03-20 — End: 2015-03-23

## 2015-03-20 MED ORDER — LACTATED RINGERS IV SOLN: INTRAVENOUS | Status: DC

## 2015-03-20 MED ORDER — HYDROMORPHONE HCL 1 MG/ML IJ SOLN
0.5000 mg | INTRAMUSCULAR | Status: DC | PRN
  Administered 2015-03-20 (×2): 0.5 mg via INTRAVENOUS
  Administered 2015-03-20 – 2015-03-21 (×2): 1 mg via INTRAVENOUS
  Filled 2015-03-20 (×4): qty 1

## 2015-03-20 MED ORDER — AMLODIPINE BESYLATE 5 MG PO TABS
2.5000 mg | ORAL_TABLET | Freq: Every day | ORAL | Status: DC
  Administered 2015-03-21 – 2015-03-23 (×3): 2.5 mg via ORAL
  Filled 2015-03-20 (×4): qty 1

## 2015-03-20 MED ORDER — ACETAMINOPHEN 10 MG/ML IV SOLN
1000.0000 mg | Freq: Four times a day (QID) | INTRAVENOUS | Status: DC
  Administered 2015-03-20 – 2015-03-21 (×3): 1000 mg via INTRAVENOUS
  Filled 2015-03-20 (×4): qty 100

## 2015-03-20 MED ORDER — HYDROCODONE-ACETAMINOPHEN 5-325 MG PO TABS: 1.0000 | ORAL_TABLET | Freq: Four times a day (QID) | ORAL | Status: DC | PRN

## 2015-03-20 MED ORDER — ROCURONIUM BROMIDE 100 MG/10ML IV SOLN
INTRAVENOUS | Status: AC
  Filled 2015-03-20: qty 3

## 2015-03-20 MED ORDER — FENTANYL CITRATE (PF) 100 MCG/2ML IJ SOLN
INTRAMUSCULAR | Status: DC | PRN
  Administered 2015-03-20: 100 ug via INTRAVENOUS
  Administered 2015-03-20 (×3): 50 ug via INTRAVENOUS

## 2015-03-20 MED ORDER — EPHEDRINE SULFATE 50 MG/ML IJ SOLN
INTRAMUSCULAR | Status: AC
  Filled 2015-03-20: qty 1

## 2015-03-20 MED ORDER — ALBUTEROL SULFATE (2.5 MG/3ML) 0.083% IN NEBU: 2.5000 mg | INHALATION_SOLUTION | RESPIRATORY_TRACT | Status: DC | PRN

## 2015-03-20 MED ORDER — DIPHENHYDRAMINE HCL 50 MG/ML IJ SOLN
12.5000 mg | Freq: Four times a day (QID) | INTRAMUSCULAR | Status: DC | PRN
  Administered 2015-03-20 – 2015-03-22 (×2): 12.5 mg via INTRAVENOUS
  Filled 2015-03-20 (×2): qty 1

## 2015-03-20 MED ORDER — STERILE WATER FOR IRRIGATION IR SOLN
Status: DC | PRN
  Administered 2015-03-20: 1000 mL

## 2015-03-20 MED ORDER — PANTOPRAZOLE SODIUM 40 MG PO TBEC
40.0000 mg | DELAYED_RELEASE_TABLET | Freq: Every day | ORAL | Status: DC
  Administered 2015-03-21 – 2015-03-23 (×3): 40 mg via ORAL
  Filled 2015-03-20 (×5): qty 1

## 2015-03-20 MED ORDER — PROMETHAZINE HCL 25 MG/ML IJ SOLN: 6.2500 mg | INTRAMUSCULAR | Status: DC | PRN

## 2015-03-20 MED ORDER — LACTATED RINGERS IR SOLN
Status: DC | PRN
  Administered 2015-03-20: 1000 mL

## 2015-03-20 MED ORDER — FENTANYL CITRATE (PF) 250 MCG/5ML IJ SOLN
INTRAMUSCULAR | Status: AC
  Filled 2015-03-20: qty 5

## 2015-03-20 MED ORDER — HYDROMORPHONE HCL 1 MG/ML IJ SOLN
INTRAMUSCULAR | Status: AC
  Filled 2015-03-20: qty 2

## 2015-03-20 MED ORDER — PROPOFOL 10 MG/ML IV BOLUS
INTRAVENOUS | Status: DC | PRN
  Administered 2015-03-20: 120 mg via INTRAVENOUS

## 2015-03-20 SURGICAL SUPPLY — 52 items
APL ESCP 34 STRL LF DISP (HEMOSTASIS)
APPLICATOR SURGIFLO ENDO (HEMOSTASIS) IMPLANT
BAG SPEC RTRVL LRG 6X4 10 (ENDOMECHANICALS)
CHLORAPREP W/TINT 26ML (MISCELLANEOUS) ×3 IMPLANT
CLIP LIGATING HEM O LOK PURPLE (MISCELLANEOUS) ×5 IMPLANT
CLIP LIGATING HEMO O LOK GREEN (MISCELLANEOUS) ×6 IMPLANT
COVER SURGICAL LIGHT HANDLE (MISCELLANEOUS) ×3 IMPLANT
COVER TIP SHEARS 8 DVNC (MISCELLANEOUS) ×1 IMPLANT
COVER TIP SHEARS 8MM DA VINCI (MISCELLANEOUS)
CUTTER FLEX LINEAR 45M (STAPLE) ×2 IMPLANT
DECANTER SPIKE VIAL GLASS SM (MISCELLANEOUS) ×3 IMPLANT
DRAIN CHANNEL 15F RND FF 3/16 (WOUND CARE) ×1 IMPLANT
DRAPE INCISE IOBAN 66X45 STRL (DRAPES) ×3 IMPLANT
DRAPE SHEET LG 3/4 BI-LAMINATE (DRAPES) ×3 IMPLANT
ELECT PENCIL ROCKER SW 15FT (MISCELLANEOUS) ×3 IMPLANT
ELECT REM PT RETURN 9FT ADLT (ELECTROSURGICAL) ×3
ELECTRODE REM PT RTRN 9FT ADLT (ELECTROSURGICAL) ×1 IMPLANT
EVACUATOR SILICONE 100CC (DRAIN) ×1 IMPLANT
GLOVE BIO SURGEON STRL SZ 6.5 (GLOVE) ×2 IMPLANT
GLOVE BIO SURGEONS STRL SZ 6.5 (GLOVE) ×1
GLOVE BIOGEL M STRL SZ7.5 (GLOVE) ×6 IMPLANT
GOWN STRL REUS W/TWL LRG LVL3 (GOWN DISPOSABLE) ×11 IMPLANT
HEMOSTAT SURGICEL 4X8 (HEMOSTASIS) IMPLANT
KIT BASIN OR (CUSTOM PROCEDURE TRAY) ×3 IMPLANT
LIQUID BAND (GAUZE/BANDAGES/DRESSINGS) ×4 IMPLANT
POSITIONER SURGICAL ARM (MISCELLANEOUS) ×6 IMPLANT
POUCH ENDO CATCH II 15MM (MISCELLANEOUS) ×2 IMPLANT
POUCH SPECIMEN RETRIEVAL 10MM (ENDOMECHANICALS) ×1 IMPLANT
RELOAD 45 VASCULAR/THIN (ENDOMECHANICALS) ×3 IMPLANT
RELOAD STAPLE 45 2.5 WHT GRN (ENDOMECHANICALS) IMPLANT
SET TUBE IRRIG SUCTION NO TIP (IRRIGATION / IRRIGATOR) ×1 IMPLANT
SOLUTION ELECTROLUBE (MISCELLANEOUS) ×3 IMPLANT
SURGIFLO W/THROMBIN 8M KIT (HEMOSTASIS) ×1 IMPLANT
SUT ETHILON 3 0 PS 1 (SUTURE) ×1 IMPLANT
SUT MNCRL AB 4-0 PS2 18 (SUTURE) ×6 IMPLANT
SUT PDS AB 1 CTX 36 (SUTURE) ×4 IMPLANT
SUT V-LOC BARB 180 2/0GR6 GS22 (SUTURE)
SUT VIC AB 0 CT1 27 (SUTURE)
SUT VIC AB 0 CT1 27XBRD ANTBC (SUTURE) ×1 IMPLANT
SUT VICRYL 0 UR6 27IN ABS (SUTURE) ×4 IMPLANT
SUT VLOC BARB 180 ABS3/0GR12 (SUTURE)
SUTURE V-LC BRB 180 2/0GR6GS22 (SUTURE) ×1 IMPLANT
SUTURE VLOC BRB 180 ABS3/0GR12 (SUTURE) ×1 IMPLANT
TAPE STRIPS DRAPE STRL (GAUZE/BANDAGES/DRESSINGS) ×1 IMPLANT
TOWEL OR 17X26 10 PK STRL BLUE (TOWEL DISPOSABLE) ×6 IMPLANT
TRAY FOLEY BAG SILVER LF 14FR (CATHETERS) ×2 IMPLANT
TRAY FOLEY W/METER SILVER 16FR (SET/KITS/TRAYS/PACK) ×1 IMPLANT
TRAY LAPAROSCOPIC (CUSTOM PROCEDURE TRAY) ×3 IMPLANT
TROCAR BLADELESS OPT 5 100 (ENDOMECHANICALS) ×3 IMPLANT
TROCAR XCEL 12X100 BLDLESS (ENDOMECHANICALS) ×3 IMPLANT
TUBING INSUF HEATED (TUBING) ×2 IMPLANT
WATER STERILE IRR 1500ML POUR (IV SOLUTION) ×2 IMPLANT

## 2015-03-20 NOTE — Anesthesia Preprocedure Evaluation (Addendum)
Anesthesia Evaluation  Patient identified by MRN, date of birth, ID band Patient awake    Reviewed: Allergy & Precautions, NPO status , Patient's Chart, lab work & pertinent test results  Airway Mallampati: II  TM Distance: >3 FB Neck ROM: Full    Dental no notable dental hx.    Pulmonary asthma , sleep apnea ,    Pulmonary exam normal breath sounds clear to auscultation       Cardiovascular hypertension, Pt. on medications negative cardio ROS Normal cardiovascular exam Rhythm:Regular Rate:Normal     Neuro/Psych negative neurological ROS  negative psych ROS   GI/Hepatic negative GI ROS, Neg liver ROS,   Endo/Other  diabetes, Insulin Dependent  Renal/GU negative Renal ROS  negative genitourinary   Musculoskeletal negative musculoskeletal ROS (+)   Abdominal   Peds negative pediatric ROS (+)  Hematology negative hematology ROS (+)   Anesthesia Other Findings   Reproductive/Obstetrics negative OB ROS                            Anesthesia Physical Anesthesia Plan  ASA: III  Anesthesia Plan: General   Post-op Pain Management:    Induction: Intravenous  Airway Management Planned: Oral ETT  Additional Equipment:   Intra-op Plan:   Post-operative Plan: Extubation in OR  Informed Consent: I have reviewed the patients History and Physical, chart, labs and discussed the procedure including the risks, benefits and alternatives for the proposed anesthesia with the patient or authorized representative who has indicated his/her understanding and acceptance.   Dental advisory given  Plan Discussed with: CRNA and Surgeon  Anesthesia Plan Comments:         Anesthesia Quick Evaluation

## 2015-03-20 NOTE — Progress Notes (Signed)
Patient ID: [name], female   DOB: Feb 18, 1941, 75 y.o.   MRN: [id number]  Post-op note  Subjective: The patient is doing well.  No complaints.  Objective: Vital signs in last 24 hours: Temp:  [97.5 F (36.4 C)-97.9 F (36.6 C)] 97.5 F (36.4 C) (01/30 1413) Pulse Rate:  [67-90] 67 (01/30 1413) Resp:  [10-18] 12 (01/30 1413) BP: (140-168)/(70-85) 149/74 mmHg (01/30 1413) SpO2:  [97 %-100 %] 100 % (01/30 1413) Weight:  [68.607 kg (151 lb 4 oz)] 68.607 kg (151 lb 4 oz) (01/30 0842)  Intake/Output from previous day:   Intake/Output this shift: Total I/O In: 2400 [I.V.:2400] Out: 450 [Urine:400; Blood:50]  Physical Exam:  General: Alert and oriented. Abdomen: Soft, Nondistended. Incisions: Clean and dry.  Lab Results:  Recent Labs  03/20/15 1339  HGB 12.7  HCT 39.5    Assessment/Plan: POD#0   1) Continue to monitor, ambulate, IS    Pryor Curia. MD   LOS: 0 days   Praveen Coia,LES 03/20/2015, 2:37 PM

## 2015-03-20 NOTE — Op Note (Signed)
Preoperative diagnosis: Renal cell carcinoma of the right kidney  Postoperative diagnosis: Renal cell carcinoma of the right kidney  Procedure: 1.  Right laparoscopic radical nephrectomy  Surgeon: Pryor Curia. M.D.  Assistant(s): Debbrah Alar, PA-C  Anesthesia: General  Complications: None  EBL: 50 mL  IVF:  1700 mL crystalloid  Specimens: 1. Right kidney  Disposition of specimens: Pathology  Indication: [name] is a 75 y.o. patient with a right renal tumor confirmed by biopsy to be renal cell carcinoma.  After a thorough review of the management options for their renal mass, they elected to proceed with surgical treatment and the above procedure.  We have discussed the potential benefits and risks of the procedure, side effects of the proposed treatment, the likelihood of the patient achieving the goals of the procedure, and any potential problems that might occur during the procedure or recuperation. Informed consent has been obtained.  Description of procedure:  The patient was taken to the operating room and a general anesthetic was administered. The patient was given preoperative antibiotics, placed in the right modified flank position, and prepped and draped in the usual sterile fashion. Next a preoperative timeout was performed.  A site was selected near the umbilicus for placement of the camera port. This was placed using a standard open Hassan technique which allowed entry into the peritoneal cavity under direct vision and without difficulty. A 12 mm Hassan cannula was placed and a pneumoperitoneum established. The camera was then used to inspect the abdomen and there was no evidence of any intra-abdominal injuries or other abnormalities. The remaining abdominal ports were then placed. A 12 mm port was placed in the right lower quadrant and a 5 mm port was placed in the right upper quadrant.  All ports were placed under direct vision without  difficulty.  Utilizing the harmonic scalpel, the white line of Toldt was incised allowing the colon to be mobilized medially and the plane between the mesocolon and the anterior layer of Gerota's fascia to be developed and the kidney exposed.  The ureter and gonadal vein were identified inferiorly and the ureter was lifted anteriorly off the psoas muscle.  Dissection proceeded superiorly along the gonadal vein until the renal vein was identified.  The renal hilum was then carefully isolated with a combination of blunt and sharp dissectiong allowing the renal arterial and venous structures to be separated and isolated.   The renal artery was isolated and ligated with multiple Weck clips and subsequently divided.  The renal vein was then isolated and also ligated and divided with a 45 mm Flex ETS stapler.  Gerota's fascia was intentionally entered superiorly and the space between the adrenal gland and the kidney was developed allowing the adrenal gland to be spared.  The hepatorenal ligaments were divided with the harmonic scalpel.  The lateral and posterior attachements to the kidney were then divided.  The ureter was ligated with Weck clips and divided allowing the specimen to be freed from all surrounding structures.  The kidney specimen was then placed into a 15 mm Endocatch II retrieval bag.  The renal hilum, liver, adrenal bed and gonadal vein areas were each inspected and hemostasis was ensured with the pneomperitoneal pressures lowered.  The 12 mm lower quadrant port was then closed with a 0-vicryl suture placed laparoscopically to close the fascia of this incision. All remaining ports were removed under direct vision.  The kidney specimen was removed intact within the retrieval bag via the camera  port site after this incision was extended slightly. This fascial opening was then closed with two #1 PDS sutures.    All incisions were injected with local anesthetic and reapproximated at the skin with  4-0 monocryl sutures.  Dermabond was applied to the skin. The patient tolerated the procedure well and without complications and was transferred to the recovery unit in satisfactory condition.   Pryor Curia MD

## 2015-03-20 NOTE — Transfer of Care (Signed)
Immediate Anesthesia Transfer of Care Note  Patient: [name]  Procedure(s) Performed: Procedure(s): LAPAROSCOPIC NEPHRECTOMY (Right)  Patient Location: PACU  Anesthesia Type:General  Level of Consciousness:  sedated, patient cooperative and responds to stimulation  Airway & Oxygen Therapy:Patient Spontanous Breathing and Patient connected to face mask oxgen  Post-op Assessment:  Report given to PACU RN and Post -op Vital signs reviewed and stable  Post vital signs:  Reviewed and stable  Last Vitals:  Filed Vitals:   03/20/15 0842  BP: 168/72  Pulse: 71  Temp: 36.6 C  Resp: 18    Complications: No apparent anesthesia complications

## 2015-03-20 NOTE — Discharge Instructions (Signed)

## 2015-03-20 NOTE — Interval H&P Note (Signed)
History and Physical Interval Note:  03/20/2015 9:59 AM  [name]  has presented today for surgery, with the diagnosis of RIGHT RENAL CELL CARCINOMA  The various methods of treatment have been discussed with the patient and family. After consideration of risks, benefits and other options for treatment, the patient has consented to  Procedure(s): RIGHT LAPAROSCOPIC NEPHRECTOMY (Right) as a surgical intervention .  The patient's history has been reviewed, patient examined, no change in status, stable for surgery.  I have reviewed the patient's chart and labs.  Questions were answered to the patient's satisfaction.     Kimmie Doren,LES

## 2015-03-20 NOTE — Anesthesia Procedure Notes (Signed)
Procedure Name: Intubation Date/Time: 03/20/2015 10:50 AM Performed by: Andrez Lieurance, Virgel Gess Pre-anesthesia Checklist: Patient identified, Emergency Drugs available, Suction available, Patient being monitored and Timeout performed Patient Re-evaluated:Patient Re-evaluated prior to inductionOxygen Delivery Method: Circle system utilized Preoxygenation: Pre-oxygenation with 100% oxygen Intubation Type: IV induction Ventilation: Mask ventilation without difficulty Laryngoscope Size: Mac and 4 Grade View: Grade II Tube type: Oral Tube size: 7.5 mm Number of attempts: 1 Airway Equipment and Method: Stylet Placement Confirmation: ETT inserted through vocal cords under direct vision,  positive ETCO2,  CO2 detector and breath sounds checked- equal and bilateral Secured at: 21 cm Tube secured with: Tape Dental Injury: Teeth and Oropharynx as per pre-operative assessment  Difficulty Due To: Difficulty was anticipated, Difficult Airway- due to anterior larynx, Difficult Airway- due to reduced neck mobility and Difficult Airway- due to dentition Future Recommendations: Recommend- induction with short-acting agent, and alternative techniques readily available

## 2015-03-21 LAB — GLUCOSE, CAPILLARY
GLUCOSE-CAPILLARY: 136 mg/dL — AB (ref 65–99)
GLUCOSE-CAPILLARY: 144 mg/dL — AB (ref 65–99)
GLUCOSE-CAPILLARY: 311 mg/dL — AB (ref 65–99)
Glucose-Capillary: 167 mg/dL — ABNORMAL HIGH (ref 65–99)
Glucose-Capillary: 163 mg/dL — ABNORMAL HIGH (ref 65–99)
Glucose-Capillary: 159 mg/dL — ABNORMAL HIGH (ref 65–99)

## 2015-03-21 LAB — BASIC METABOLIC PANEL
Anion gap: 9 (ref 5–15)
BUN: 19 mg/dL (ref 6–20)
CALCIUM: 9 mg/dL (ref 8.9–10.3)
CO2: 22 mmol/L (ref 22–32)
CREATININE: 1.75 mg/dL — AB (ref 0.44–1.00)
Chloride: 107 mmol/L (ref 101–111)
GFR calc non Af Amer: 27 mL/min — ABNORMAL LOW (ref >60)
GFR, EST AFRICAN AMERICAN: 31 mL/min — AB (ref >60)
Glucose, Bld: 179 mg/dL — ABNORMAL HIGH (ref 65–99)
Potassium: 5.2 mmol/L — ABNORMAL HIGH (ref 3.5–5.1)
SODIUM: 138 mmol/L (ref 135–145)

## 2015-03-21 LAB — HEMOGLOBIN AND HEMATOCRIT, BLOOD
HEMATOCRIT: 38.2 % (ref 36.0–46.0)
Hemoglobin: 12.4 g/dL (ref 12.0–15.0)

## 2015-03-21 MED ORDER — OXYCODONE-ACETAMINOPHEN 5-325 MG PO TABS
1.0000 | ORAL_TABLET | Freq: Four times a day (QID) | ORAL | Status: DC | PRN
  Administered 2015-03-21 – 2015-03-22 (×3): 2 via ORAL
  Filled 2015-03-21 (×3): qty 2

## 2015-03-21 MED ORDER — BISACODYL 10 MG RE SUPP
10.0000 mg | Freq: Once | RECTAL | Status: AC
  Administered 2015-03-21: 10 mg via RECTAL
  Filled 2015-03-21: qty 1

## 2015-03-21 MED ORDER — HYDROCODONE-ACETAMINOPHEN 5-325 MG PO TABS
1.0000 | ORAL_TABLET | Freq: Four times a day (QID) | ORAL | Status: DC | PRN
Start: 2015-03-21 — End: 2015-03-21
  Administered 2015-03-21: 2 via ORAL
  Filled 2015-03-21: qty 2

## 2015-03-21 MED ORDER — BOOST / RESOURCE BREEZE PO LIQD
1.0000 | Freq: Two times a day (BID) | ORAL | Status: DC
  Administered 2015-03-23: 1 via ORAL

## 2015-03-21 NOTE — Progress Notes (Signed)
Initial Nutrition Assessment  DOCUMENTATION CODES:   Obesity unspecified  INTERVENTION:  - Will order Boost Breeze po TID, each supplement provides 250 kcal and 9 grams of protein - Continue to advance diet per MD  - RD will continue to monitor for needs  NUTRITION DIAGNOSIS:   Increased nutrient needs related to catabolic illness, cancer and cancer related treatments as evidenced by estimated needs.  GOAL:   Patient will meet greater than or equal to 90% of their needs  MONITOR:   Diet advancement, PO intake, Supplement acceptance, Weight trends, Labs, Skin, I & O's  REASON FOR ASSESSMENT:   Malnutrition Screening Tool  ASSESSMENT:   Renal cell carcinoma: She is s/p a left robotic assisted laparoscopic partial nephrectomy on October 06, 2008. Her tumor was incidentally detected on a CT scan performed for lower abdominal pain.   Pt seen for MST. BMI indicates obesity. Pt on CLD through breakfast this AM at which time she ate 100% of broth, jello, and juice. Pt also sipping on Boost Breeze at time of RD visit and states she enjoys this supplement. Pt's diet now advanced to FLD for lunch and pt very persistent about receiving solid foods and asks several times when diet will be further advanced. Informed pt that this will be MD decision. Pt states that she is not having any nausea but she is having ongoing abdominal pain which is exacerbated by PO intakes. She is POD #1 right L/S radical nephrectomy for renal cell carcinoma.   Pt states that appetite in the few days before surgery was good but for 3-4 prior to that it was decreased she states that appetite began to decrease shortly before Christmas. She states that during this time she did have a change in medication which has caused taste alteration (bland taste) and that this occurs with all foods and drinks. Pt denies chewing or swallowing issues.  She states that she has lost 10-12 lbs in the past 3-4 weeks and that she is able to  visualize weight loss in the facial area. Per chart review, pt has lost 1 lb (0.7% body weight) in the past 1 month which is not significant for time frame. No muscle or fat wasting noted during assessment.   Not meeting needs due to advancing diet. Medications reviewed. Labs reviewed; CBGs: 89-177 mg/dL, K: 5.2 mg/dL, creatinine elevated, GFR: 31.   Diet Order:  Diet full liquid Room service appropriate?: Yes; Fluid consistency:: Thin  Skin:  Wound (see comment) (Abdominal surgical incision from 03/20/15)  Last BM:  PTA  Height:   Ht Readings from Last 1 Encounters:  03/20/15 4\' 11"  (1.499 m)    Weight:   Wt Readings from Last 1 Encounters:  03/20/15 151 lb 4 oz (68.607 kg)    Ideal Body Weight:  44.54 kg (kg)  BMI:  Body mass index is 30.53 kg/(m^2).  Estimated Nutritional Needs:   Kcal:  [id number]  Protein:  82-95 grams (1.2-1.4 grams/kg)  Fluid:  2-2.2 L/day  EDUCATION NEEDS:   No education needs identified at this time     Jarome Matin, RD, LDN Inpatient Clinical Dietitian Pager # [phone] After hours/weekend pager # [phone]

## 2015-03-21 NOTE — Progress Notes (Signed)
Patient ID: [name], female   DOB: [birth year], 75 y.o.   MRN: [id number]  1 Day Post-Op Subjective: Pt doing well.  Ambulated some last night.  No nausea or vomiting.  Complains of incisional pain but controlled with medication. No flatus.  Objective: Vital signs in last 24 hours: Temp:  [97.3 F (36.3 C)-97.9 F (36.6 C)] 97.3 F (36.3 C) (01/31 0500) Pulse Rate:  [65-90] 65 (01/31 0500) Resp:  [10-18] 18 (01/31 0500) BP: (140-168)/(60-85) 145/66 mmHg (01/31 0500) SpO2:  [97 %-100 %] 100 % (01/31 0500) Weight:  [68.607 kg (151 lb 4 oz)] 68.607 kg (151 lb 4 oz) (01/30 0842)  Intake/Output from previous day: 01/30 0701 - 01/31 0700 In: 4971.7 [P.O.:480; I.V.:4141.7; IV Piggyback:350] Out: 1650 [Urine:1600; Blood:50] Intake/Output this shift:    Physical Exam:  General: Alert and oriented CV: RRR Lungs: Clear Abdomen: Soft, ND, Minimal BS Incisions: C/D/I Ext: NT, No erythema  Lab Results:  Recent Labs  03/20/15 1339 03/21/15 0530  HGB 12.7 12.4  HCT 39.5 38.2   BMET  Recent Labs  03/20/15 1339 03/21/15 0530  NA 140 138  K 4.1 5.2*  CL 109 107  CO2 23 22  GLUCOSE 92 179*  BUN 10 19  CREATININE 1.20* 1.75*  CALCIUM 8.7* 9.0     Studies/Results: Path pending.  Assessment/Plan: POD # 1 s/p right L/S radical nephrectomy for RCC - Ambulate 4-6 times, IS - D/C catheter - Dulcolax suppository - Follow renal function - Transition to oral pain medication   LOS: 1 day   Beckham Buxbaum,LES 03/21/2015, 7:36 AM

## 2015-03-21 NOTE — Progress Notes (Signed)
Pt ambulated 50 feet bed to hall unable to go far. Encouraged IS--[phone] will cont to monitor patient. SRP, RN

## 2015-03-21 NOTE — Anesthesia Postprocedure Evaluation (Signed)
Anesthesia Post Note  Patient: [name]  Procedure(s) Performed: Procedure(s) (LRB): LAPAROSCOPIC NEPHRECTOMY (Right)  Patient location during evaluation: PACU Anesthesia Type: General Level of consciousness: awake and alert Pain management: pain level controlled Vital Signs Assessment: post-procedure vital signs reviewed and stable Respiratory status: spontaneous breathing, nonlabored ventilation, respiratory function stable and patient connected to nasal cannula oxygen Cardiovascular status: blood pressure returned to baseline and stable Postop Assessment: no signs of nausea or vomiting Anesthetic complications: no    Last Vitals:  Filed Vitals:   03/20/15 2045 03/21/15 0500  BP: 149/60 145/66  Pulse: 67 65  Temp: 36.3 C 36.3 C  Resp: 18 18    Last Pain:  Filed Vitals:   03/21/15 0852  PainSc: 7                  [name] S

## 2015-03-22 LAB — BASIC METABOLIC PANEL
ANION GAP: 7 (ref 5–15)
BUN: 30 mg/dL — ABNORMAL HIGH (ref 6–20)
CHLORIDE: 109 mmol/L (ref 101–111)
CO2: 25 mmol/L (ref 22–32)
CREATININE: 2.36 mg/dL — AB (ref 0.44–1.00)
Calcium: 9.3 mg/dL (ref 8.9–10.3)
GFR calc non Af Amer: 19 mL/min — ABNORMAL LOW (ref >60)
GFR, EST AFRICAN AMERICAN: 22 mL/min — AB (ref >60)
Glucose, Bld: 118 mg/dL — ABNORMAL HIGH (ref 65–99)
Potassium: 4.5 mmol/L (ref 3.5–5.1)
Sodium: 141 mmol/L (ref 135–145)

## 2015-03-22 LAB — GLUCOSE, CAPILLARY
GLUCOSE-CAPILLARY: 173 mg/dL — AB (ref 65–99)
GLUCOSE-CAPILLARY: 173 mg/dL — AB (ref 65–99)
GLUCOSE-CAPILLARY: 189 mg/dL — AB (ref 65–99)
Glucose-Capillary: 119 mg/dL — ABNORMAL HIGH (ref 65–99)
Glucose-Capillary: 109 mg/dL — ABNORMAL HIGH (ref 65–99)

## 2015-03-22 LAB — HEMOGLOBIN AND HEMATOCRIT, BLOOD
HCT: 37.2 % (ref 36.0–46.0)
Hemoglobin: 11.9 g/dL — ABNORMAL LOW (ref 12.0–15.0)

## 2015-03-22 MED ORDER — BISACODYL 10 MG RE SUPP
10.0000 mg | Freq: Once | RECTAL | Status: AC
  Administered 2015-03-22: 10 mg via RECTAL
  Filled 2015-03-22: qty 1

## 2015-03-22 MED ORDER — HYDROCODONE-ACETAMINOPHEN 5-325 MG PO TABS
1.0000 | ORAL_TABLET | Freq: Four times a day (QID) | ORAL | Status: DC | PRN
  Administered 2015-03-22: 2 via ORAL
  Administered 2015-03-23: 1 via ORAL
  Filled 2015-03-22 (×2): qty 2

## 2015-03-22 NOTE — Progress Notes (Signed)
Patient ID: [name], female   DOB: 1940/08/08, 75 y.o.   MRN: [id number]  Pt still had not passed flatus earlier today and was concerned about going home.  Tonight, she is now improved and has now passed flatus and feels better.  Plan for d/c in am.

## 2015-03-22 NOTE — Progress Notes (Signed)
Patient ID: [name], female   DOB: March 23, 1940, 75 y.o.   MRN: [id number]  2 Days Post-Op Subjective: Pain control improved but severe itching with Percocet.  Tolerating diet.  Ambulating well. No flatus.  Objective: Vital signs in last 24 hours: Temp:  [98.3 F (36.8 C)-98.6 F (37 C)] 98.6 F (37 C) (02/01 0356) Pulse Rate:  [65-67] 65 (02/01 0356) Resp:  [18-20] 18 (02/01 0356) BP: (128-142)/(54-65) 142/65 mmHg (02/01 0356) SpO2:  [96 %-98 %] 98 % (02/01 0356)  Intake/Output from previous day: 01/31 0701 - 02/01 0700 In: 600 [I.V.:600] Out: -  Intake/Output this shift:    Physical Exam:  General: Alert and oriented CV: RRR Lungs: Clear Abdomen: Soft, ND, Positive BS Incisions: C/D/I Ext: NT, No erythema  Lab Results:  Recent Labs  03/20/15 1339 03/21/15 0530 03/22/15 0544  HGB 12.7 12.4 11.9*  HCT 39.5 38.2 37.2   BMET  Recent Labs  03/21/15 0530 03/22/15 0544  NA 138 141  K 5.2* 4.5  CL 107 109  CO2 22 25  GLUCOSE 179* 118*  BUN 19 30*  CREATININE 1.75* 2.36*  CALCIUM 9.0 9.3     Studies/Results: No results found.  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 Discussed with patient. - Continue monitoring renal function which can be continued as an outpatient.  Expect this to level off soon.  Continue to encourage po hydration. - Will change pain meds back to hydrocodone to see if pain controlled with less itching this morning. - Hope for d/c later today.   LOS: 2 days   Fuller Makin,LES 03/22/2015, 8:11 AM

## 2015-03-23 LAB — GLUCOSE, CAPILLARY
GLUCOSE-CAPILLARY: 194 mg/dL — AB (ref 65–99)
Glucose-Capillary: 166 mg/dL — ABNORMAL HIGH (ref 65–99)
Glucose-Capillary: 207 mg/dL — ABNORMAL HIGH (ref 65–99)
Glucose-Capillary: 90 mg/dL (ref 65–99)

## 2015-03-23 LAB — BASIC METABOLIC PANEL
ANION GAP: 8 (ref 5–15)
BUN: 32 mg/dL — AB (ref 6–20)
CHLORIDE: 106 mmol/L (ref 101–111)
CO2: 28 mmol/L (ref 22–32)
CREATININE: 2.65 mg/dL — AB (ref 0.44–1.00)
Calcium: 9.4 mg/dL (ref 8.9–10.3)
GFR calc non Af Amer: 16 mL/min — ABNORMAL LOW (ref >60)
GFR, EST AFRICAN AMERICAN: 19 mL/min — AB (ref >60)
GLUCOSE: 151 mg/dL — AB (ref 65–99)
Potassium: 4.1 mmol/L (ref 3.5–5.1)
Sodium: 142 mmol/L (ref 135–145)

## 2015-03-23 NOTE — Discharge Summary (Signed)
Date of admission: 03/20/2015  Date of discharge: 03/23/2015  Admission diagnosis: Renal cell carcinoma of the right kidney  Discharge diagnosis: Renal cell carcinoma of the right kidney  Secondary diagnoses: Diabetes, HTN, history of renal cell carcinoma of the left kidney  History and Physical: For full details, please see admission history and physical. Briefly, [name] is a 75 y.o. year old patient with a history of renal cell carcinoma s/p left partial nephrectomy.  She was found to have a new enhancing centrally located right renal mass that was biopsied and proven to be a new renal cell carcinoma.   Hospital Course: She was admitted to the OR on 03/20/15 and underwent a uncomplicated right radical laparoscopic nephrectomy.  She was admitted postoperatively to the hospital floor.  She was able to begin ambulating and her diet was gradually advanced on POD # 1.  By POD # 2, her pain was better controlled with po pain medication but she still had abdominal cramping and had not passed flatus.  By POD # 3, she was passing flatus and was able to be discharged home.  Her Cr increased as expected following nephrectomy.   Laboratory values:  Recent Labs  03/20/15 1339 03/21/15 0530 03/22/15 0544  HGB 12.7 12.4 11.9*  HCT 39.5 38.2 37.2    Recent Labs  03/22/15 0544 03/23/15 0449  CREATININE 2.36* 2.65*    Disposition: Home  Discharge instruction: The patient was instructed to be ambulatory but told to refrain from heavy lifting, strenuous activity, or driving.   Discharge medications:    Medication List    STOP taking these medications        aspirin 81 MG chewable tablet     CENTRUM SILVER PO     cephALEXin 500 MG capsule  Commonly known as:  KEFLEX     multivitamin with minerals Tabs tablet     oxyCODONE 5 MG immediate release tablet  Commonly known as:  ROXICODONE     Vitamin D 1000 units capsule      TAKE these medications        albuterol 90 MCG/ACT  inhaler  Commonly known as:  PROVENTIL,VENTOLIN  Inhale 2 puffs into the lungs every 4 (four) hours as needed for wheezing or shortness of breath.     amLODipine 2.5 MG tablet  Commonly known as:  NORVASC  Take 2.5 mg by mouth daily.     atorvastatin 10 MG tablet  Commonly known as:  LIPITOR  Take 10 mg by mouth every morning.     esomeprazole 40 MG capsule  Commonly known as:  NEXIUM  Take 40 mg by mouth daily as needed (acid reflux).     gabapentin 300 MG capsule  Commonly known as:  NEURONTIN  TAKE 1 CAPSULE (300 MG TOTAL) BY MOUTH AT BEDTIME.     HYDROcodone-acetaminophen 5-325 MG tablet  Commonly known as:  NORCO  Take 1-2 tablets by mouth every 6 (six) hours as needed.     lisinopril 40 MG tablet  Commonly known as:  PRINIVIL,ZESTRIL  Take 40 mg by mouth every morning.     metoprolol tartrate 25 MG tablet  Commonly known as:  LOPRESSOR  Take 25 mg by mouth 2 (two) times daily.     mirabegron ER 50 MG Tb24 tablet  Commonly known as:  MYRBETRIQ  Take 50 mg by mouth daily.     nitroGLYCERIN 0.4 MG SL tablet  Commonly known as:  NITROSTAT  Place 0.4 mg under  the tongue every 5 (five) minutes as needed for chest pain.     TRESIBA FLEXTOUCH 200 UNIT/ML Sopn  Generic drug:  Insulin Degludec  Inject 68 Units into the skin daily before breakfast.        Followup:      Follow-up Information    Follow up with Dutch Gray, MD On 04/18/2015.   Specialty:  Urology   Why:  at 9:00   Contact information:   [place] [address] [phone]       Follow up with [facility]   Why:  Will call to schedule labs for next week   Contact information:   [place] [address] [phone]

## 2015-03-23 NOTE — Care Management Important Message (Signed)
Important Message  Patient Details  Name: [name] MRN: [id number] Date of Birth: 07/03/1940   Medicare Important Message Given:  Yes    Camillo Flaming 03/23/2015, 9:24 AMImportant Message  Patient Details  Name: [name] MRN: [id number] Date of Birth: [birth year]   Medicare Important Message Given:  Yes    Camillo Flaming 03/23/2015, 9:23 AM

## 2015-03-23 NOTE — Progress Notes (Signed)
Patient ID: [name], female   DOB: 1940/06/23, 75 y.o.   MRN: [id number]  3 Days Post-Op Subjective: Pt passing flatus and continues to feel improved.   Objective: Vital signs in last 24 hours: Temp:  [98 F (36.7 C)-98.9 F (37.2 C)] 98 F (36.7 C) (02/02 0450) Pulse Rate:  [69-81] 69 (02/02 0450) Resp:  [18-20] 18 (02/02 0450) BP: (142-148)/(57-72) 142/57 mmHg (02/02 0450) SpO2:  [96 %-97 %] 97 % (02/02 0450)  Intake/Output from previous day: 02/01 0701 - 02/02 0700 In: 540 [P.O.:540] Out: 1 [Urine:1] Intake/Output this shift:    Physical Exam:  General: Alert and oriented CV: RRR Lungs: Clear Abdomen: Soft, ND, NT, Positive BS Incisions:C/D/I Ext: NT, No erythema  Lab Results:  Recent Labs  03/20/15 1339 03/21/15 0530 03/22/15 0544  HGB 12.7 12.4 11.9*  HCT 39.5 38.2 37.2   BMET  Recent Labs  03/22/15 0544 03/23/15 0449  NA 141 142  K 4.5 4.1  CL 109 106  CO2 25 28  GLUCOSE 118* 151*  BUN 30* 32*  CREATININE 2.36* 2.65*  CALCIUM 9.3 9.4     Assessment/Plan: POD # 3 s/p right lap nephrectomy for RCC - D/C home this morning - Instructions provided - Will recheck renal function next week as outpatient   LOS: 3 days   Ammaar Encina,LES 03/23/2015, 7:04 AM

## 2015-03-25 ENCOUNTER — Emergency Department (HOSPITAL_COMMUNITY)
Admission: EM | Admit: 2015-03-25 | Discharge: 2015-03-25 | Disposition: A | Discharge status: Home / Self Care | Payer: Medicare Other | Attending: Emergency Medicine | Admitting: Emergency Medicine

## 2015-03-25 ENCOUNTER — Emergency Department (HOSPITAL_COMMUNITY): Payer: Medicare Other

## 2015-03-25 ENCOUNTER — Encounter (HOSPITAL_COMMUNITY): Payer: Self-pay | Admitting: Emergency Medicine

## 2015-03-25 DIAGNOSIS — Z79899 Other long term (current) drug therapy: Secondary | ICD-10-CM | POA: Insufficient documentation

## 2015-03-25 DIAGNOSIS — N189 Chronic kidney disease, unspecified: Secondary | ICD-10-CM | POA: Insufficient documentation

## 2015-03-25 DIAGNOSIS — M1990 Unspecified osteoarthritis, unspecified site: Secondary | ICD-10-CM | POA: Diagnosis not present

## 2015-03-25 DIAGNOSIS — R42 Dizziness and giddiness: Secondary | ICD-10-CM | POA: Diagnosis not present

## 2015-03-25 DIAGNOSIS — Z9889 Other specified postprocedural states: Secondary | ICD-10-CM | POA: Insufficient documentation

## 2015-03-25 DIAGNOSIS — R109 Unspecified abdominal pain: Secondary | ICD-10-CM | POA: Diagnosis not present

## 2015-03-25 DIAGNOSIS — Z794 Long term (current) use of insulin: Secondary | ICD-10-CM | POA: Diagnosis not present

## 2015-03-25 DIAGNOSIS — G8918 Other acute postprocedural pain: Secondary | ICD-10-CM | POA: Diagnosis not present

## 2015-03-25 DIAGNOSIS — Z905 Acquired absence of kidney: Secondary | ICD-10-CM | POA: Insufficient documentation

## 2015-03-25 DIAGNOSIS — Z87440 Personal history of urinary (tract) infections: Secondary | ICD-10-CM | POA: Diagnosis not present

## 2015-03-25 DIAGNOSIS — E109 Type 1 diabetes mellitus without complications: Secondary | ICD-10-CM | POA: Insufficient documentation

## 2015-03-25 DIAGNOSIS — I209 Angina pectoris, unspecified: Secondary | ICD-10-CM | POA: Insufficient documentation

## 2015-03-25 DIAGNOSIS — K219 Gastro-esophageal reflux disease without esophagitis: Secondary | ICD-10-CM | POA: Insufficient documentation

## 2015-03-25 DIAGNOSIS — Z85528 Personal history of other malignant neoplasm of kidney: Secondary | ICD-10-CM | POA: Diagnosis not present

## 2015-03-25 DIAGNOSIS — J45909 Unspecified asthma, uncomplicated: Secondary | ICD-10-CM | POA: Insufficient documentation

## 2015-03-25 DIAGNOSIS — R110 Nausea: Secondary | ICD-10-CM | POA: Insufficient documentation

## 2015-03-25 DIAGNOSIS — I129 Hypertensive chronic kidney disease with stage 1 through stage 4 chronic kidney disease, or unspecified chronic kidney disease: Secondary | ICD-10-CM | POA: Insufficient documentation

## 2015-03-25 DIAGNOSIS — E7800 Pure hypercholesterolemia, unspecified: Secondary | ICD-10-CM | POA: Diagnosis not present

## 2015-03-25 LAB — BASIC METABOLIC PANEL
Anion gap: 9 (ref 5–15)
BUN: 28 mg/dL — AB (ref 6–20)
CO2: 28 mmol/L (ref 22–32)
Calcium: 9.4 mg/dL (ref 8.9–10.3)
Chloride: 100 mmol/L — ABNORMAL LOW (ref 101–111)
Creatinine, Ser: 2.1 mg/dL — ABNORMAL HIGH (ref 0.44–1.00)
GFR calc Af Amer: 25 mL/min — ABNORMAL LOW (ref >60)
GFR, EST NON AFRICAN AMERICAN: 22 mL/min — AB (ref >60)
GLUCOSE: 297 mg/dL — AB (ref 65–99)
POTASSIUM: 4.9 mmol/L (ref 3.5–5.1)
Sodium: 137 mmol/L (ref 135–145)

## 2015-03-25 LAB — URINALYSIS, ROUTINE W REFLEX MICROSCOPIC
BILIRUBIN URINE: NEGATIVE
KETONES UR: NEGATIVE mg/dL
NITRITE: NEGATIVE
PH: 6.5 (ref 5.0–8.0)
Protein, ur: NEGATIVE mg/dL
Specific Gravity, Urine: 1.013 (ref 1.005–1.030)

## 2015-03-25 LAB — URINE MICROSCOPIC-ADD ON

## 2015-03-25 LAB — CBC
HEMATOCRIT: 40.1 % (ref 36.0–46.0)
Hemoglobin: 12.7 g/dL (ref 12.0–15.0)
MCH: 29.3 pg (ref 26.0–34.0)
MCHC: 31.7 g/dL (ref 30.0–36.0)
MCV: 92.4 fL (ref 78.0–100.0)
Platelets: 218 10*3/uL (ref 150–400)
RBC: 4.34 MIL/uL (ref 3.87–5.11)
RDW: 13.8 % (ref 11.5–15.5)
WBC: 5.4 10*3/uL (ref 4.0–10.5)

## 2015-03-25 LAB — CBG MONITORING, ED
GLUCOSE-CAPILLARY: 235 mg/dL — AB (ref 65–99)
Glucose-Capillary: 323 mg/dL — ABNORMAL HIGH (ref 65–99)

## 2015-03-25 MED ORDER — MORPHINE SULFATE (PF) 4 MG/ML IV SOLN
4.0000 mg | Freq: Once | INTRAVENOUS | Status: AC
  Administered 2015-03-25: 4 mg via INTRAVENOUS
  Filled 2015-03-25: qty 1

## 2015-03-25 MED ORDER — MORPHINE SULFATE (PF) 4 MG/ML IV SOLN
4.0000 mg | Freq: Once | INTRAVENOUS | Status: AC
Start: 2015-03-25 — End: 2015-03-25
  Administered 2015-03-25: 4 mg via INTRAVENOUS
  Filled 2015-03-25: qty 1

## 2015-03-25 NOTE — ED Notes (Signed)
Pt advised per MD order U/A needed. Pt. Offered assistance PRN to restroom/commode/bedpan. Pt confirms/understood. Labeled specimen cup at bedside. ENM

## 2015-03-25 NOTE — ED Provider Notes (Signed)
CSN: [id number]     Arrival date & time 03/25/15  1133 History   First MD Initiated Contact with Patient 03/25/15 1159     Chief Complaint  Patient presents with  . Dizziness  . Nausea  . Flank Pain    HPI    75 year old female with a history of renal cell carcinoma the right kidney status post partial nephrectomy on 03/20/2015( 4 days ago) performed by Dr. Alinda Money. She reports she was discharged 2 days ago with a follow-up 2 days from now. She reports that since being discharged she has continued to have right abdominal pain on the right flank and surgical incision sites. Patient reports since discharge she has not had a bowel movement but continues to pass flatus. Patient reports that she has been taking hydrocodone for pain management which has improved symptoms moderately but not resolved them completely. Patient notes that she start taking MiraLAX yesterday as she was concerned that she had not had a bowel movement.    Past Medical History  Diagnosis Date  . Hypertension   . Renal failure (ARF), acute on chronic (HCC)   . Hypercholesteremia   . Reflux   . Asthma   . Dysrhythmia     "irregular heart beat"  . Sleep apnea     does not use c-pap machine  . Diabetes mellitus     Type 1  . GERD (gastroesophageal reflux disease)   . left renal ca dx'd 2012 (?)    surg only, left kidney  . Anginal pain ([place])     just occurs without any specific trigger; pt states has never had to use nitroglycerin tabs; uses rest to relieve  . Shortness of breath dyspnea     pt denies; states can climb stairs w/o difficulty   . Tingling in extremities     legs bilat  . Tinnitus   . Vertigo     occurs when lying flat   . History of urinary tract infection   . Urinary incontinence   . Urinary frequency   . Nocturia   . Arthritis    Past Surgical History  Procedure Laterality Date  . Renal mass excision    . Abdominal hysterectomy    . Appendectomy    . Spine surgery    . Cardiac  catheterization N/A 07/28/2014    Procedure: Left Heart Cath and Coronary Angiography;  Surgeon: [name], MD;  Location: [facility];  Service: Cardiovascular;  Laterality: N/A;  . Peripheral vascular catheterization N/A 07/28/2014    Procedure: Aortic Arch Angiography;  Surgeon: [name], MD;  Location: [place];  Service: Cardiovascular;  Laterality: N/A;  . Cystoscopy with retrograde pyelogram, ureteroscopy and stent placement Right 02/09/2015    Procedure: CYSTOSCOPY WITH RETROGRADE PYELOGRAM AND URETEROSCOPY ;  Surgeon: Raynelle Bring, MD;  Location: [facility];  Service: Urology;  Laterality: Right;  . Laparoscopic nephrectomy Right 03/20/2015    Procedure: LAPAROSCOPIC NEPHRECTOMY;  Surgeon: Raynelle Bring, MD;  Location: [facility];  Service: Urology;  Laterality: Right;   Family History  Problem Relation Age of Onset  . Coronary artery disease    . Hypertension Mother    Social History  Substance Use Topics  . Smoking status: Never Smoker   . Smokeless tobacco: Never Used  . Alcohol Use: No   OB History    No data available     Review of Systems  All other systems reviewed and are negative.   Allergies  Sulfa antibiotics  Home Medications   Prior to Admission medications   Medication Sig Start Date End Date Taking? Authorizing Provider  albuterol (PROVENTIL,VENTOLIN) 90 MCG/ACT inhaler Inhale 2 puffs into the lungs every 4 (four) hours as needed for wheezing or shortness of breath.    Yes Historical Provider, MD  amLODipine (NORVASC) 2.5 MG tablet Take 2.5 mg by mouth daily.     Yes Historical Provider, MD  atorvastatin (LIPITOR) 10 MG tablet Take 10 mg by mouth every morning.    Yes Historical Provider, MD  cholecalciferol (VITAMIN D) 1000 units tablet Take 1,000 Units by mouth daily.   Yes Historical Provider, MD  esomeprazole (NEXIUM) 40 MG capsule Take 40 mg by mouth daily as needed (acid reflux).    Yes Historical Provider, MD  gabapentin (NEURONTIN) 300 MG  capsule TAKE 1 CAPSULE (300 MG TOTAL) BY MOUTH AT BEDTIME. 11/14/14  Yes Richard Joelene Millin, DPM  Insulin Degludec (TRESIBA FLEXTOUCH) 200 UNIT/ML SOPN Inject 68 Units into the skin daily before breakfast.   Yes Historical Provider, MD  lisinopril (PRINIVIL,ZESTRIL) 40 MG tablet Take 40 mg by mouth every morning.    Yes Historical Provider, MD  metoprolol tartrate (LOPRESSOR) 25 MG tablet Take 25 mg by mouth 2 (two) times daily.   Yes Historical Provider, MD  mirabegron ER (MYRBETRIQ) 50 MG TB24 tablet Take 50 mg by mouth daily.   Yes Historical Provider, MD  nitroGLYCERIN (NITROSTAT) 0.4 MG SL tablet Place 0.4 mg under the tongue every 5 (five) minutes as needed for chest pain.   Yes Historical Provider, MD  HYDROcodone-acetaminophen (NORCO) 5-325 MG tablet Take 1-2 tablets by mouth every 6 (six) hours as needed. Patient not taking: Reported on 03/25/2015 03/20/15   Debbrah Alar, PA-C   BP 174/78 mmHg  Pulse 90  Temp(Src) 98.3 F (36.8 C) (Oral)  Resp 20  SpO2 97%   Physical Exam  Constitutional: She is oriented to person, place, and time. She appears well-developed and well-nourished.  HENT:  Head: Normocephalic and atraumatic.  Eyes: Conjunctivae are normal. Pupils are equal, round, and reactive to light. Right eye exhibits no discharge. Left eye exhibits no discharge. No scleral icterus.  Neck: Normal range of motion. No JVD present. No tracheal deviation present.  Cardiovascular: Normal rate, regular rhythm, normal heart sounds and intact distal pulses.  Exam reveals no gallop and no friction rub.   No murmur heard. Pulmonary/Chest: Effort normal and breath sounds normal. No stridor. No respiratory distress. She has no wheezes. She has no rales.  Abdominal: Bowel sounds are normal. She exhibits no distension and no mass. There is no tenderness. There is no rebound and no guarding.  Postsurgical incision sites clean, minimally tender, no signs of infection  No signs of peritonitis,  rebound, guarding, mass  Musculoskeletal: Normal range of motion. She exhibits no edema or tenderness.  Neurological: She is alert and oriented to person, place, and time. Coordination normal.  Skin: Skin is warm and dry. No rash noted. No erythema. No pallor.  Psychiatric: She has a normal mood and affect. Her behavior is normal. Judgment and thought content normal.  Nursing note and vitals reviewed.   ED Course  Procedures (including critical care time) Labs Review Labs Reviewed  BASIC METABOLIC PANEL - Abnormal; Notable for the following:    Chloride 100 (*)    Glucose, Bld 297 (*)    BUN 28 (*)    Creatinine, Ser 2.10 (*)    GFR calc non Af Wyvonnia Lora  22 (*)    GFR calc Af Amer 25 (*)    All other components within normal limits  URINALYSIS, ROUTINE W REFLEX MICROSCOPIC (NOT AT [facility]) - Abnormal; Notable for the following:    APPearance CLOUDY (*)    Glucose, UA >1000 (*)    Hgb urine dipstick TRACE (*)    Leukocytes, UA SMALL (*)    All other components within normal limits  URINE MICROSCOPIC-ADD ON - Abnormal; Notable for the following:    Squamous Epithelial / LPF 6-30 (*)    Bacteria, UA FEW (*)    All other components within normal limits  CBG MONITORING, ED - Abnormal; Notable for the following:    Glucose-Capillary 323 (*)    All other components within normal limits  CBG MONITORING, ED - Abnormal; Notable for the following:    Glucose-Capillary 235 (*)    All other components within normal limits  URINE CULTURE  CBC    Imaging Review Dg Abd Acute W/chest  03/25/2015  CLINICAL DATA:  Right-sided abdominal pain. EXAM: DG ABDOMEN ACUTE W/ 1V CHEST COMPARISON:  None. FINDINGS: There is no evidence of dilated bowel loops or free intraperitoneal air. Phlebolith is noted in the pelvis. Heart size and mediastinal contours are within normal limits. Both lungs are clear. IMPRESSION: No evidence of bowel obstruction or ileus. No acute cardiopulmonary disease. Electronically Signed    By: Marijo Conception, M.D.   On: 03/25/2015 15:16   I have personally reviewed and evaluated these images and lab results as part of my medical decision-making.   EKG Interpretation   Date/Time:  Saturday March 25 2015 12:00:25 EST Ventricular Rate:  71 PR Interval:  165 QRS Duration: 74 QT Interval:  374 QTC Calculation: 406 R Axis:   71 Text Interpretation:  Incomplete analysis due to missing data in  precordial lead(s) Sinus rhythm Missing lead(s): V6 Confirmed by Jeneen Rinks   MD, [place] ([id number]) on 03/25/2015 12:06:23 PM      MDM   Final diagnoses:  Post-op pain   Labs: Urinalysis, urine microscopic, CBG, BMP- hyperglycemia  Imaging: DG abdomen acute with chest  Consults: Nephrology  Therapeutics: Morphine  Discharge Meds:    Assessment/Plan: [age] female presents status post nephrectomy with postsurgical pain. Patient has not had a bowel movement and continues to pass flatus. She has abdominal pain over the incision sites and right flank, no new abdominal pain or signs of peritonitis noted. She has reassuring vital signs, is afebrile, nontoxic, tolerating by mouth. She has no significant abnormalities in her labs of an assisted further evaluation or management here in the ED. DG abdomen acute with chest shows no sign of obstruction or any other abnormalities. I consultation urology who instructed have patient follow-up with Dr. Alinda Money Monday for further evaluation and management. Patient will be discharged home, she has adequate pain management at home, she will be instructed to follow-up with Dr. Eden Lathe on Monday, return to emergency room immediately if any new or worsening signs or symptoms present. She verbalizes understanding and agreement for today's plan and no further questions or concerns at time of discharge.        Okey Regal, PA-C 03/25/15 [place], MD 03/29/15 [phone]

## 2015-03-25 NOTE — ED Notes (Signed)
Pt reports continued dizziness, nausea, and right flank pain post discharge related to right cancerous kidney removal.

## 2015-03-25 NOTE — Discharge Instructions (Signed)
Please follow-up with Dr. Alinda Money on Monday for further evaluation and management, return to the emergency room immediately if any new or worsening signs or symptoms present.

## 2015-03-27 DIAGNOSIS — C641 Malignant neoplasm of right kidney, except renal pelvis: Secondary | ICD-10-CM | POA: Diagnosis not present

## 2015-03-28 LAB — URINE CULTURE
Culture: >=100000
Special Requests: NORMAL

## 2015-03-29 ENCOUNTER — Telehealth (HOSPITAL_COMMUNITY): Payer: Self-pay

## 2015-03-29 NOTE — Progress Notes (Signed)
ED Antimicrobial Stewardship Positive Culture Follow Up   [name] is an 75 y.o. female who presented to [facility] on 03/25/2015 with a chief complaint of  Chief Complaint  Patient presents with  . Dizziness  . Nausea  . Flank Pain    Recent Results (from the past 720 hour(s))  Urine culture     Status: None   Collection Time: 03/25/15  1:23 PM  Result Value Ref Range Status   Specimen Description URINE, CLEAN CATCH  Final   Special Requests Normal  Final   Culture   Final    >=100,000 COLONIES/mL STAPHYLOCOCCUS SPECIES (COAGULASE NEGATIVE) Performed at [facility]    Report Status 03/28/2015 FINAL  Final   Organism ID, Bacteria STAPHYLOCOCCUS SPECIES (COAGULASE NEGATIVE)  Final      Susceptibility   Staphylococcus species (coagulase negative) - MIC*    CIPROFLOXACIN 2 INTERMEDIATE Intermediate     GENTAMICIN <=0.5 SENSITIVE Sensitive     NITROFURANTOIN <=16 SENSITIVE Sensitive     OXACILLIN 1 RESISTANT Resistant     TETRACYCLINE >=16 RESISTANT Resistant     VANCOMYCIN 2 SENSITIVE Sensitive     TRIMETH/SULFA >=320 RESISTANT Resistant     CLINDAMYCIN <=0.25 SENSITIVE Sensitive     RIFAMPIN <=0.5 SENSITIVE Sensitive     Inducible Clindamycin NEGATIVE Sensitive     * >=100,000 COLONIES/mL STAPHYLOCOCCUS SPECIES (COAGULASE NEGATIVE)    Flow manager to fax results to Dr. Lynne Logan office. Call office to let them know and advise patient to call office to follow up with results.  ED Provider: Montine Circle PA-C   [name] 03/29/2015, 9:16 AM Infectious Diseases Pharmacist Phone# [phone]

## 2015-03-29 NOTE — Telephone Encounter (Signed)
Post ED Visit - Positive Culture Follow-up: Successful Patient Follow-Up  Culture assessed and recommendations reviewed by: [x]  Elenor Quinones, Pharm.D. []  Heide Guile, Pharm.D., BCPS []  Parks Neptune, Pharm.D. []  Alycia Rossetti, Pharm.D., BCPS []  [place], [place].D., BCPS, AAHIVP []  Legrand Como, Pharm.D., BCPS, AAHIVP []  Milus Glazier, Pharm.D. []  Stephens November, Pharm.D.  Positive urine culture, >/= 100,000 colonies -> Staph species  [x]  Patient discharged without antimicrobial prescription and treatment is now indicated []  Organism is resistant to prescribed ED discharge antimicrobial []  Patient with positive blood cultures  Changes discussed with ED provider: R. Marlon Pel PA  New antibiotic prescription fax results to Dr Alinda Money.  See if Flow Manager will advise urology of findings.   Called to   Contacted patient, date 03/29/2015, time 1515  Cx results faxed to Dr Alinda Money [phone].     Dortha Kern 03/29/2015, 3:32 PM

## 2015-04-05 DIAGNOSIS — N183 Chronic kidney disease, stage 3 (moderate): Secondary | ICD-10-CM | POA: Diagnosis not present

## 2015-04-05 DIAGNOSIS — E1122 Type 2 diabetes mellitus with diabetic chronic kidney disease: Secondary | ICD-10-CM | POA: Diagnosis not present

## 2015-04-18 DIAGNOSIS — C641 Malignant neoplasm of right kidney, except renal pelvis: Secondary | ICD-10-CM | POA: Diagnosis not present

## 2015-04-18 DIAGNOSIS — Z0000 Encounter for general adult medical examination without abnormal findings: Secondary | ICD-10-CM | POA: Diagnosis not present

## 2015-04-18 IMAGING — CR DG CHEST 2V
2 series · 2 of 2 positions shown · non-contrast
Comparison: 01/13/2012

CLINICAL DATA: Chest pain, shortness of breath

CHEST - 2 VIEW

[w chest pa]
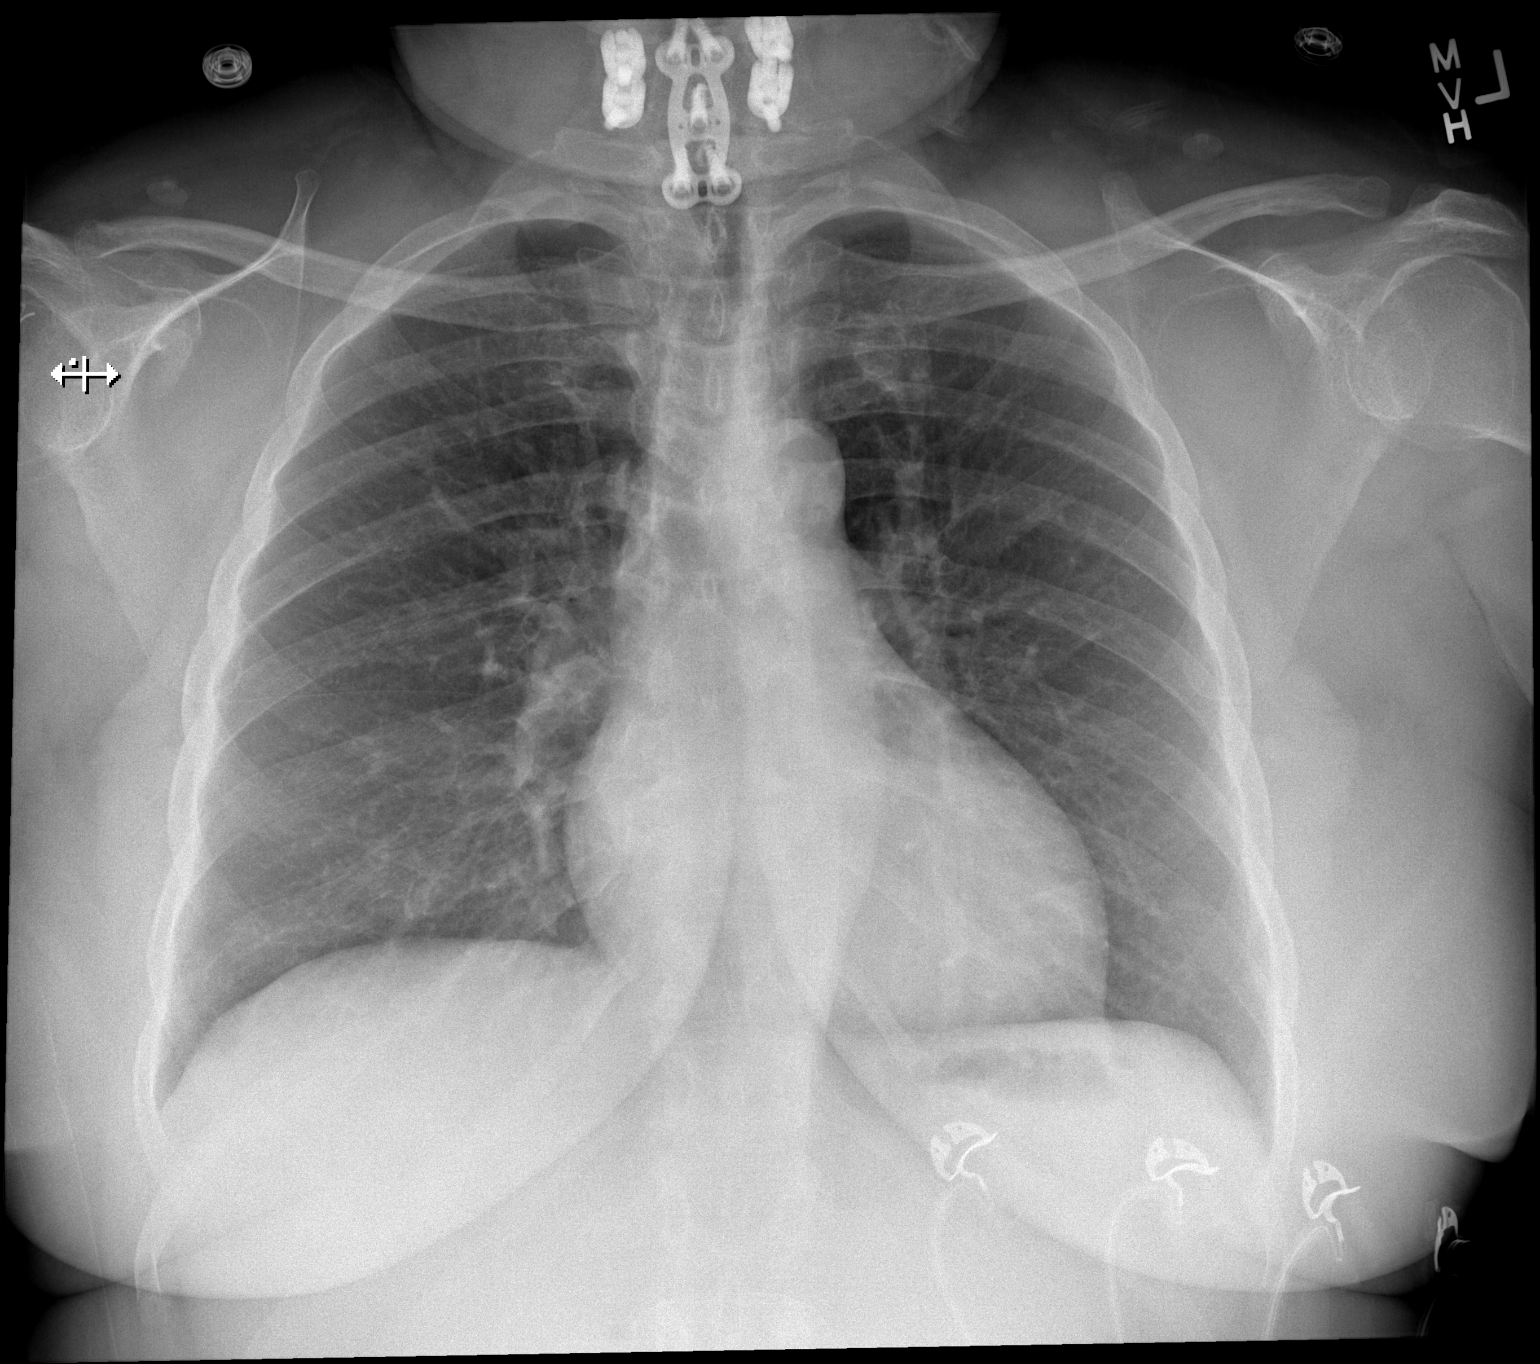

[w chest lat]
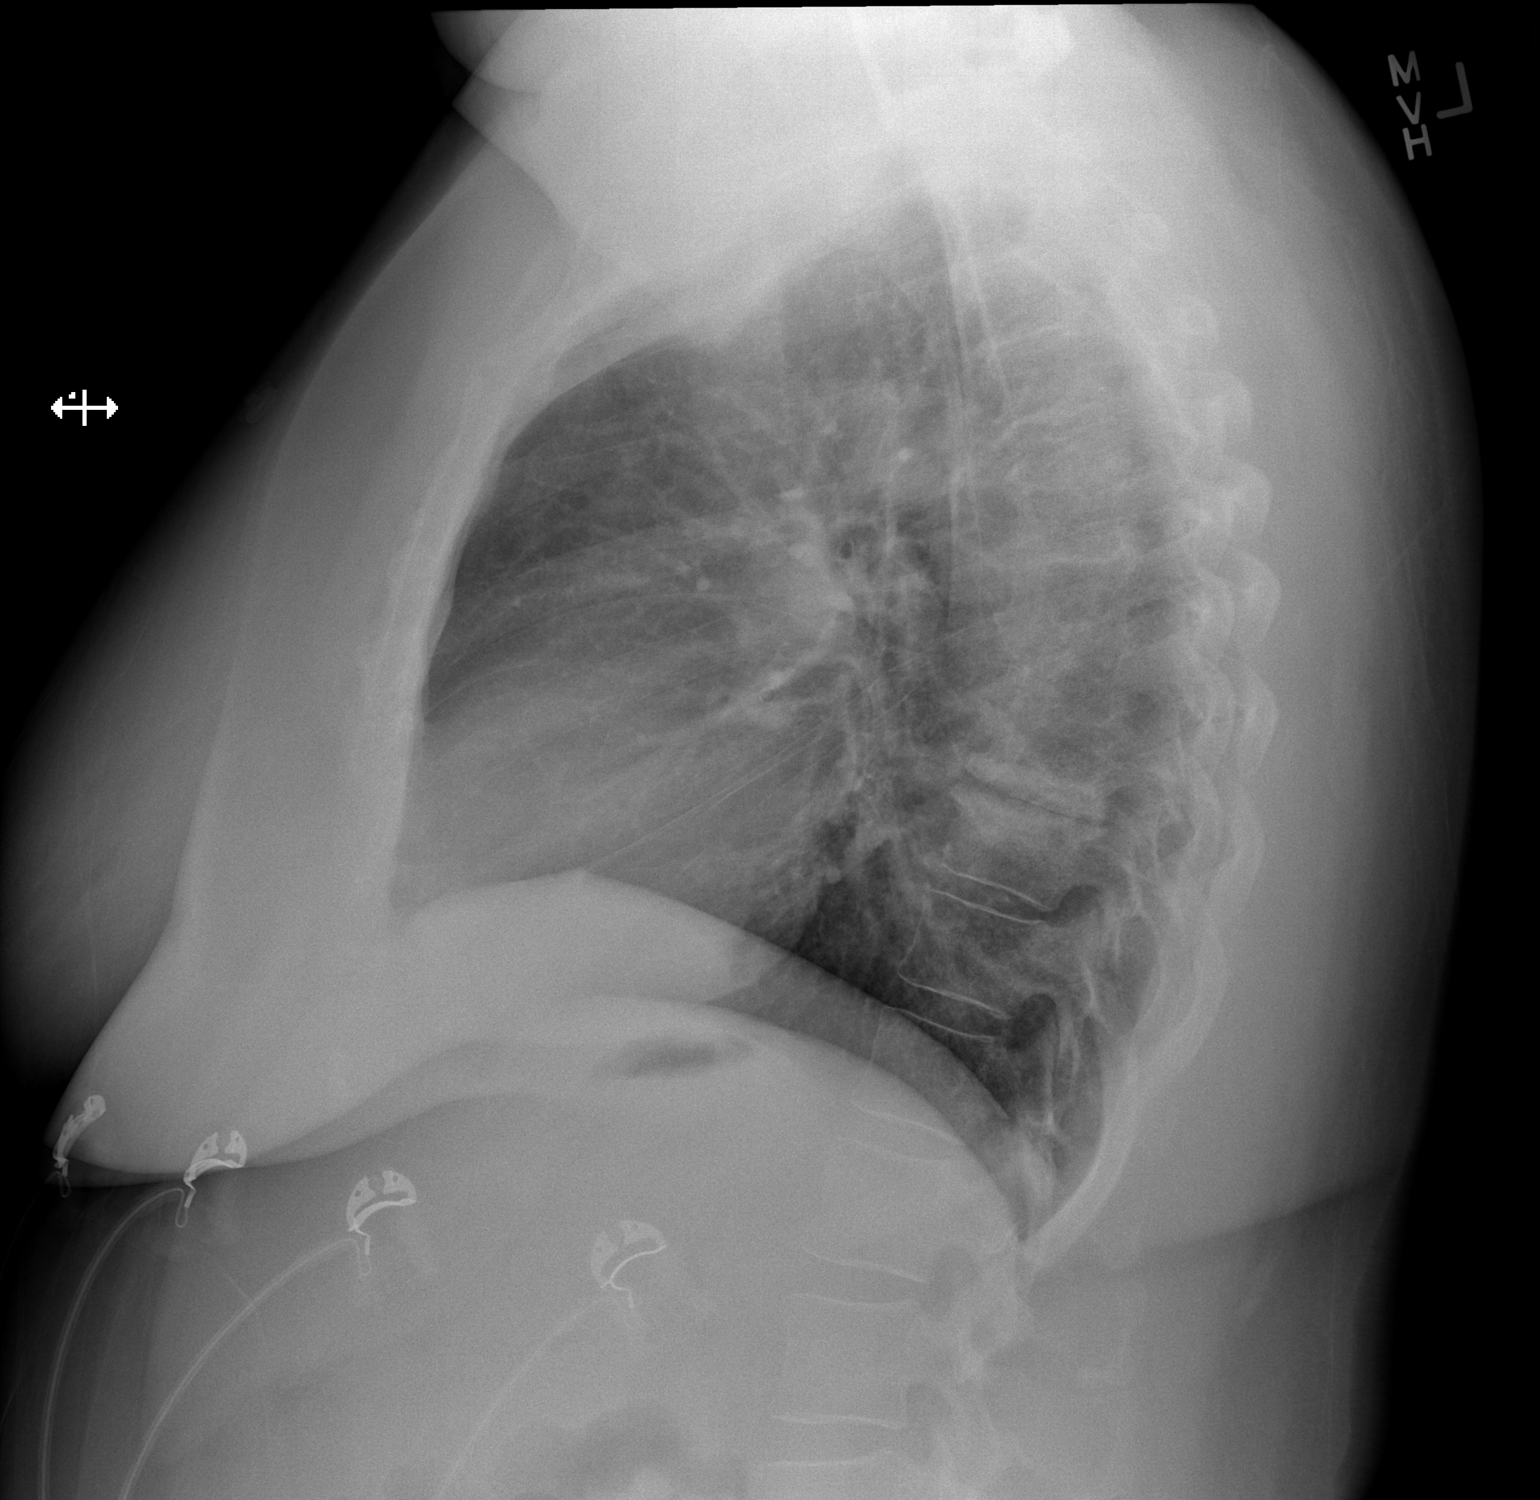

[2 of 2 positions shown; findings below may reference images not displayed]

FINDINGS: Lungs are clear. No pleural effusion or pneumothorax.

Cardiomediastinal silhouette is within normal limits.

Degenerative changes of the visualized thoracolumbar spine.
Cervical spine fixation hardware.
IMPRESSION: No evidence of acute cardiopulmonary disease.
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
## 2015-05-03 DIAGNOSIS — N184 Chronic kidney disease, stage 4 (severe): Secondary | ICD-10-CM | POA: Diagnosis not present

## 2015-05-03 DIAGNOSIS — D631 Anemia in chronic kidney disease: Secondary | ICD-10-CM | POA: Diagnosis not present

## 2015-05-03 DIAGNOSIS — N179 Acute kidney failure, unspecified: Secondary | ICD-10-CM | POA: Diagnosis not present

## 2015-05-03 DIAGNOSIS — N189 Chronic kidney disease, unspecified: Secondary | ICD-10-CM | POA: Diagnosis not present

## 2015-05-03 DIAGNOSIS — N2581 Secondary hyperparathyroidism of renal origin: Secondary | ICD-10-CM | POA: Diagnosis not present

## 2015-05-03 DIAGNOSIS — C641 Malignant neoplasm of right kidney, except renal pelvis: Secondary | ICD-10-CM | POA: Diagnosis not present

## 2015-05-03 DIAGNOSIS — C642 Malignant neoplasm of left kidney, except renal pelvis: Secondary | ICD-10-CM | POA: Diagnosis not present

## 2015-05-03 DIAGNOSIS — I10 Essential (primary) hypertension: Secondary | ICD-10-CM | POA: Diagnosis not present

## 2015-05-03 DIAGNOSIS — E119 Type 2 diabetes mellitus without complications: Secondary | ICD-10-CM | POA: Diagnosis not present

## 2015-05-16 ENCOUNTER — Ambulatory Visit (INDEPENDENT_AMBULATORY_CARE_PROVIDER_SITE_OTHER): Payer: Medicare Other | Admitting: Ophthalmology

## 2015-05-16 DIAGNOSIS — E113393 Type 2 diabetes mellitus with moderate nonproliferative diabetic retinopathy without macular edema, bilateral: Secondary | ICD-10-CM | POA: Diagnosis not present

## 2015-05-16 DIAGNOSIS — I10 Essential (primary) hypertension: Secondary | ICD-10-CM

## 2015-05-16 DIAGNOSIS — H43813 Vitreous degeneration, bilateral: Secondary | ICD-10-CM | POA: Diagnosis not present

## 2015-05-16 DIAGNOSIS — E11319 Type 2 diabetes mellitus with unspecified diabetic retinopathy without macular edema: Secondary | ICD-10-CM | POA: Diagnosis not present

## 2015-05-16 DIAGNOSIS — H35033 Hypertensive retinopathy, bilateral: Secondary | ICD-10-CM | POA: Diagnosis not present

## 2015-05-17 DIAGNOSIS — I10 Essential (primary) hypertension: Secondary | ICD-10-CM | POA: Diagnosis not present

## 2015-05-17 DIAGNOSIS — C641 Malignant neoplasm of right kidney, except renal pelvis: Secondary | ICD-10-CM | POA: Diagnosis not present

## 2015-05-31 DIAGNOSIS — I10 Essential (primary) hypertension: Secondary | ICD-10-CM | POA: Diagnosis not present

## 2015-05-31 DIAGNOSIS — E1165 Type 2 diabetes mellitus with hyperglycemia: Secondary | ICD-10-CM | POA: Diagnosis not present

## 2015-05-31 DIAGNOSIS — E119 Type 2 diabetes mellitus without complications: Secondary | ICD-10-CM | POA: Diagnosis not present

## 2015-05-31 DIAGNOSIS — N184 Chronic kidney disease, stage 4 (severe): Secondary | ICD-10-CM | POA: Diagnosis not present

## 2015-08-02 ENCOUNTER — Other Ambulatory Visit: Payer: Self-pay | Admitting: Podiatry

## 2015-08-02 DIAGNOSIS — R109 Unspecified abdominal pain: Secondary | ICD-10-CM | POA: Diagnosis not present

## 2015-08-02 DIAGNOSIS — N179 Acute kidney failure, unspecified: Secondary | ICD-10-CM | POA: Diagnosis not present

## 2015-08-02 DIAGNOSIS — E119 Type 2 diabetes mellitus without complications: Secondary | ICD-10-CM | POA: Diagnosis not present

## 2015-08-02 DIAGNOSIS — I10 Essential (primary) hypertension: Secondary | ICD-10-CM | POA: Diagnosis not present

## 2015-08-02 DIAGNOSIS — C641 Malignant neoplasm of right kidney, except renal pelvis: Secondary | ICD-10-CM | POA: Diagnosis not present

## 2015-08-02 DIAGNOSIS — N189 Chronic kidney disease, unspecified: Secondary | ICD-10-CM | POA: Diagnosis not present

## 2015-08-02 DIAGNOSIS — E669 Obesity, unspecified: Secondary | ICD-10-CM | POA: Diagnosis not present

## 2015-08-02 DIAGNOSIS — C642 Malignant neoplasm of left kidney, except renal pelvis: Secondary | ICD-10-CM | POA: Diagnosis not present

## 2015-08-02 DIAGNOSIS — D631 Anemia in chronic kidney disease: Secondary | ICD-10-CM | POA: Diagnosis not present

## 2015-08-02 DIAGNOSIS — N184 Chronic kidney disease, stage 4 (severe): Secondary | ICD-10-CM | POA: Diagnosis not present

## 2015-08-02 DIAGNOSIS — N2581 Secondary hyperparathyroidism of renal origin: Secondary | ICD-10-CM | POA: Diagnosis not present

## 2015-08-02 NOTE — Telephone Encounter (Signed)
Pt needs an appt prior to future refills. 

## 2015-08-09 DIAGNOSIS — E1122 Type 2 diabetes mellitus with diabetic chronic kidney disease: Secondary | ICD-10-CM | POA: Diagnosis not present

## 2015-08-09 DIAGNOSIS — E1165 Type 2 diabetes mellitus with hyperglycemia: Secondary | ICD-10-CM | POA: Diagnosis not present

## 2015-08-23 DIAGNOSIS — R1084 Generalized abdominal pain: Secondary | ICD-10-CM | POA: Diagnosis not present

## 2015-08-24 DIAGNOSIS — I10 Essential (primary) hypertension: Secondary | ICD-10-CM | POA: Diagnosis not present

## 2015-09-14 ENCOUNTER — Telehealth: Payer: Self-pay | Admitting: *Deleted

## 2015-09-14 MED ORDER — GABAPENTIN 300 MG PO CAPS: 300.0000 mg | ORAL_CAPSULE | Freq: Every day | ORAL | 0 refills | Status: DC

## 2015-09-14 NOTE — Telephone Encounter (Addendum)
Refill request for Gabapentin 300mg .  Dr. Paulla Dolly states refill for 90, no additional pt needs to be established with another doctor. 09/19/2015-I informed pt she would need to see a doctor prior to refills.  Transferred pt to schedulers.

## 2015-09-25 ENCOUNTER — Ambulatory Visit (INDEPENDENT_AMBULATORY_CARE_PROVIDER_SITE_OTHER): Payer: Medicare Other | Admitting: Podiatry

## 2015-09-25 ENCOUNTER — Encounter: Payer: Self-pay | Admitting: Podiatry

## 2015-09-25 DIAGNOSIS — M79672 Pain in left foot: Secondary | ICD-10-CM

## 2015-09-25 DIAGNOSIS — E1142 Type 2 diabetes mellitus with diabetic polyneuropathy: Secondary | ICD-10-CM

## 2015-09-26 NOTE — Progress Notes (Signed)
Subjective:     Patient ID: [name], female   DOB: 15-Dec-1940, 75 y.o.   MRN: [id number]  HPI patient presents stating that the gabapentin seems to help and she needs more but she like to reduce her dose   Review of Systems     Objective:   Physical Exam Neurovascular status unchanged with patient doing better with foot structure but still has mild discomfort and tingling    Assessment:     Improving but still having some pathology    Plan:     We will reduce her utilization of the gabapentin at this time and patient will be seen back for [place] to recheck as needed

## 2015-10-04 DIAGNOSIS — N183 Chronic kidney disease, stage 3 (moderate): Secondary | ICD-10-CM | POA: Diagnosis not present

## 2015-10-04 DIAGNOSIS — R55 Syncope and collapse: Secondary | ICD-10-CM | POA: Diagnosis not present

## 2015-10-04 DIAGNOSIS — E1122 Type 2 diabetes mellitus with diabetic chronic kidney disease: Secondary | ICD-10-CM | POA: Diagnosis not present

## 2015-10-17 ENCOUNTER — Other Ambulatory Visit: Payer: Self-pay

## 2015-10-18 ENCOUNTER — Ambulatory Visit (HOSPITAL_COMMUNITY)
Admission: RE | Admit: 2015-10-18 | Discharge: 2015-10-18 | Disposition: A | Discharge status: Home / Self Care | Payer: Medicare Other | Source: Ambulatory Visit | Attending: Urology | Admitting: Urology

## 2015-10-18 ENCOUNTER — Other Ambulatory Visit (HOSPITAL_COMMUNITY): Payer: Self-pay | Admitting: Urology

## 2015-10-18 DIAGNOSIS — C641 Malignant neoplasm of right kidney, except renal pelvis: Secondary | ICD-10-CM

## 2015-10-18 DIAGNOSIS — C642 Malignant neoplasm of left kidney, except renal pelvis: Secondary | ICD-10-CM | POA: Diagnosis not present

## 2015-10-23 ENCOUNTER — Observation Stay (HOSPITAL_COMMUNITY): Payer: Medicare Other

## 2015-10-23 ENCOUNTER — Observation Stay (HOSPITAL_COMMUNITY)
Admission: EM | Admit: 2015-10-23 | Discharge: 2015-10-25 | Disposition: A | Discharge status: Home / Self Care | Payer: Medicare Other | Attending: Internal Medicine | Admitting: Internal Medicine

## 2015-10-23 ENCOUNTER — Emergency Department (HOSPITAL_COMMUNITY): Payer: Medicare Other

## 2015-10-23 ENCOUNTER — Encounter (HOSPITAL_COMMUNITY): Payer: Self-pay | Admitting: Emergency Medicine

## 2015-10-23 DIAGNOSIS — K219 Gastro-esophageal reflux disease without esophagitis: Secondary | ICD-10-CM | POA: Insufficient documentation

## 2015-10-23 DIAGNOSIS — Z85528 Personal history of other malignant neoplasm of kidney: Secondary | ICD-10-CM | POA: Insufficient documentation

## 2015-10-23 DIAGNOSIS — M1990 Unspecified osteoarthritis, unspecified site: Secondary | ICD-10-CM | POA: Insufficient documentation

## 2015-10-23 DIAGNOSIS — I129 Hypertensive chronic kidney disease with stage 1 through stage 4 chronic kidney disease, or unspecified chronic kidney disease: Secondary | ICD-10-CM | POA: Diagnosis not present

## 2015-10-23 DIAGNOSIS — Z6829 Body mass index (BMI) 29.0-29.9, adult: Secondary | ICD-10-CM | POA: Insufficient documentation

## 2015-10-23 DIAGNOSIS — E1121 Type 2 diabetes mellitus with diabetic nephropathy: Secondary | ICD-10-CM | POA: Insufficient documentation

## 2015-10-23 DIAGNOSIS — I10 Essential (primary) hypertension: Secondary | ICD-10-CM | POA: Diagnosis present

## 2015-10-23 DIAGNOSIS — J45909 Unspecified asthma, uncomplicated: Secondary | ICD-10-CM | POA: Insufficient documentation

## 2015-10-23 DIAGNOSIS — R202 Paresthesia of skin: Secondary | ICD-10-CM

## 2015-10-23 DIAGNOSIS — Z79899 Other long term (current) drug therapy: Secondary | ICD-10-CM | POA: Insufficient documentation

## 2015-10-23 DIAGNOSIS — Z905 Acquired absence of kidney: Secondary | ICD-10-CM | POA: Diagnosis not present

## 2015-10-23 DIAGNOSIS — R072 Precordial pain: Secondary | ICD-10-CM | POA: Diagnosis not present

## 2015-10-23 DIAGNOSIS — M542 Cervicalgia: Secondary | ICD-10-CM | POA: Diagnosis not present

## 2015-10-23 DIAGNOSIS — M2560 Stiffness of unspecified joint, not elsewhere classified: Secondary | ICD-10-CM

## 2015-10-23 DIAGNOSIS — C649 Malignant neoplasm of unspecified kidney, except renal pelvis: Secondary | ICD-10-CM | POA: Diagnosis present

## 2015-10-23 DIAGNOSIS — G4730 Sleep apnea, unspecified: Secondary | ICD-10-CM | POA: Diagnosis not present

## 2015-10-23 DIAGNOSIS — E785 Hyperlipidemia, unspecified: Secondary | ICD-10-CM | POA: Diagnosis not present

## 2015-10-23 DIAGNOSIS — R05 Cough: Secondary | ICD-10-CM | POA: Diagnosis not present

## 2015-10-23 DIAGNOSIS — E1169 Type 2 diabetes mellitus with other specified complication: Secondary | ICD-10-CM

## 2015-10-23 DIAGNOSIS — M25512 Pain in left shoulder: Secondary | ICD-10-CM

## 2015-10-23 DIAGNOSIS — R079 Chest pain, unspecified: Principal | ICD-10-CM | POA: Diagnosis present

## 2015-10-23 DIAGNOSIS — E669 Obesity, unspecified: Secondary | ICD-10-CM | POA: Insufficient documentation

## 2015-10-23 DIAGNOSIS — N184 Chronic kidney disease, stage 4 (severe): Secondary | ICD-10-CM | POA: Diagnosis not present

## 2015-10-23 DIAGNOSIS — Z794 Long term (current) use of insulin: Secondary | ICD-10-CM | POA: Diagnosis not present

## 2015-10-23 DIAGNOSIS — E1159 Type 2 diabetes mellitus with other circulatory complications: Secondary | ICD-10-CM | POA: Diagnosis present

## 2015-10-23 LAB — CBC
HEMATOCRIT: 35.2 % — AB (ref 36.0–46.0)
Hemoglobin: 12.1 g/dL (ref 12.0–15.0)
MCH: 30.3 pg (ref 26.0–34.0)
MCHC: 34.4 g/dL (ref 30.0–36.0)
MCV: 88 fL (ref 78.0–100.0)
Platelets: 216 10*3/uL (ref 150–400)
RBC: 4 MIL/uL (ref 3.87–5.11)
RDW: 13.5 % (ref 11.5–15.5)
WBC: 4.4 10*3/uL (ref 4.0–10.5)

## 2015-10-23 LAB — CK: Total CK: 85 U/L (ref 38–234)

## 2015-10-23 LAB — I-STAT TROPONIN, ED: TROPONIN I, POC: 0 ng/mL (ref 0.00–0.08)

## 2015-10-23 LAB — BASIC METABOLIC PANEL
ANION GAP: 5 (ref 5–15)
BUN: 35 mg/dL — AB (ref 6–20)
CO2: 25 mmol/L (ref 22–32)
Calcium: 9.5 mg/dL (ref 8.9–10.3)
Chloride: 108 mmol/L (ref 101–111)
Creatinine, Ser: 1.99 mg/dL — ABNORMAL HIGH (ref 0.44–1.00)
GFR calc Af Amer: 27 mL/min — ABNORMAL LOW (ref >60)
GFR, EST NON AFRICAN AMERICAN: 23 mL/min — AB (ref >60)
Glucose, Bld: 175 mg/dL — ABNORMAL HIGH (ref 65–99)
POTASSIUM: 4.2 mmol/L (ref 3.5–5.1)
SODIUM: 138 mmol/L (ref 135–145)

## 2015-10-23 LAB — D-DIMER, QUANTITATIVE (NOT AT ARMC)

## 2015-10-23 LAB — TROPONIN I: Troponin I: <0.03 ng/mL (ref <0.03)

## 2015-10-23 LAB — GLUCOSE, CAPILLARY: GLUCOSE-CAPILLARY: 98 mg/dL (ref 65–99)

## 2015-10-23 MED ORDER — VITAMIN D3 25 MCG (1000 UNIT) PO TABS
1000.0000 [IU] | ORAL_TABLET | Freq: Every day | ORAL | Status: DC
  Administered 2015-10-24 – 2015-10-25 (×2): 1000 [IU] via ORAL
  Filled 2015-10-23 (×2): qty 1

## 2015-10-23 MED ORDER — ONDANSETRON HCL 4 MG/2ML IJ SOLN: 4.0000 mg | Freq: Four times a day (QID) | INTRAMUSCULAR | Status: DC | PRN

## 2015-10-23 MED ORDER — ZOLPIDEM TARTRATE 5 MG PO TABS
5.0000 mg | ORAL_TABLET | Freq: Every evening | ORAL | Status: DC | PRN
  Administered 2015-10-23 – 2015-10-24 (×2): 5 mg via ORAL
  Filled 2015-10-23 (×2): qty 1

## 2015-10-23 MED ORDER — LISINOPRIL 40 MG PO TABS
40.0000 mg | ORAL_TABLET | Freq: Every morning | ORAL | Status: DC
  Administered 2015-10-24: 40 mg via ORAL
  Filled 2015-10-23: qty 2
  Filled 2015-10-23 (×2): qty 1

## 2015-10-23 MED ORDER — AMLODIPINE BESYLATE 10 MG PO TABS
10.0000 mg | ORAL_TABLET | Freq: Every day | ORAL | Status: DC
  Administered 2015-10-24 – 2015-10-25 (×2): 10 mg via ORAL
  Filled 2015-10-23 (×2): qty 1

## 2015-10-23 MED ORDER — NITROGLYCERIN 0.4 MG SL SUBL: 0.4000 mg | SUBLINGUAL_TABLET | SUBLINGUAL | Status: DC | PRN

## 2015-10-23 MED ORDER — INSULIN GLARGINE 100 UNIT/ML ~~LOC~~ SOLN
74.0000 [IU] | Freq: Every day | SUBCUTANEOUS | Status: DC
  Administered 2015-10-24 – 2015-10-25 (×2): 74 [IU] via SUBCUTANEOUS
  Filled 2015-10-23 (×2): qty 0.74

## 2015-10-23 MED ORDER — PANTOPRAZOLE SODIUM 40 MG PO TBEC
40.0000 mg | DELAYED_RELEASE_TABLET | Freq: Every day | ORAL | Status: DC
  Administered 2015-10-24 – 2015-10-25 (×2): 40 mg via ORAL
  Filled 2015-10-23 (×2): qty 1

## 2015-10-23 MED ORDER — MIRABEGRON ER 50 MG PO TB24
50.0000 mg | ORAL_TABLET | Freq: Every day | ORAL | Status: DC
  Administered 2015-10-24 – 2015-10-25 (×2): 50 mg via ORAL
  Filled 2015-10-23 (×2): qty 1

## 2015-10-23 MED ORDER — GABAPENTIN 300 MG PO CAPS: 300.0000 mg | ORAL_CAPSULE | Freq: Every day | ORAL | Status: DC | PRN

## 2015-10-23 MED ORDER — ALBUTEROL 90 MCG/ACT IN AERS: 2.0000 | INHALATION_SPRAY | RESPIRATORY_TRACT | Status: DC | PRN

## 2015-10-23 MED ORDER — ALBUTEROL SULFATE (2.5 MG/3ML) 0.083% IN NEBU: 2.5000 mg | INHALATION_SOLUTION | RESPIRATORY_TRACT | Status: DC | PRN

## 2015-10-23 MED ORDER — ASPIRIN EC 325 MG PO TBEC
325.0000 mg | DELAYED_RELEASE_TABLET | Freq: Every day | ORAL | Status: DC
  Administered 2015-10-24 – 2015-10-25 (×2): 325 mg via ORAL
  Filled 2015-10-23 (×2): qty 1

## 2015-10-23 MED ORDER — ENOXAPARIN SODIUM 40 MG/0.4ML ~~LOC~~ SOLN
40.0000 mg | Freq: Every day | SUBCUTANEOUS | Status: DC
  Administered 2015-10-23: 40 mg via SUBCUTANEOUS
  Filled 2015-10-23: qty 0.4

## 2015-10-23 MED ORDER — METOPROLOL TARTRATE 25 MG PO TABS
25.0000 mg | ORAL_TABLET | Freq: Two times a day (BID) | ORAL | Status: DC
  Administered 2015-10-23 – 2015-10-25 (×4): 25 mg via ORAL
  Filled 2015-10-23 (×4): qty 1

## 2015-10-23 MED ORDER — INSULIN ASPART 100 UNIT/ML ~~LOC~~ SOLN
0.0000 [IU] | Freq: Three times a day (TID) | SUBCUTANEOUS | Status: DC
Start: 2015-10-24 — End: 2015-10-25
  Administered 2015-10-24: 3 [IU] via SUBCUTANEOUS
  Administered 2015-10-24: 5 [IU] via SUBCUTANEOUS
  Administered 2015-10-24: 3 [IU] via SUBCUTANEOUS

## 2015-10-23 MED ORDER — ACETAMINOPHEN 325 MG PO TABS: 650.0000 mg | ORAL_TABLET | ORAL | Status: DC | PRN

## 2015-10-23 MED ORDER — ATORVASTATIN CALCIUM 10 MG PO TABS
10.0000 mg | ORAL_TABLET | Freq: Every morning | ORAL | Status: DC
  Administered 2015-10-24 – 2015-10-25 (×2): 10 mg via ORAL
  Filled 2015-10-23 (×2): qty 1

## 2015-10-23 NOTE — ED Provider Notes (Signed)
[facility] DEPT Provider Note   CSN: [id number] Arrival date & time: 10/23/15  1549     History   Chief Complaint Chief Complaint  Patient presents with  . Chest Pain    HPI [name] is a 76 y.o. female presents with chest pain and left arm pain and numbness. PMH significant for anginal pain, arrhythmia, DM Type 1 controlled on insulin, diabetic neuropathy, GERD, HTN, HLD, renal cell carcinoma s/p left partial nephrectomy and right nephrectomy, s/p neck surgery. Cath by Dr. Einar Gip in June 2016 showed normal coronary arteries and slow filling in RCA which may indicate endothelial dysfunction. Normal ascending aorta. EF 55-65%. She states the pain started a week and a half ago. She noticed she was unable to lie on her left side. The pain is substernal, constant, radiates to the left arm. She reports associated cough which is productive at times, intermittent numbness of the arm, and worse pain with movement and exertion. She has not tried anything for pain. Denies hx of DVT/PE, hemoptysis, leg swelling. Denies fever, chills, palpitations, SOB, wheezing, abdominal pain, N/V.  HPI  Past Medical History:  Diagnosis Date  . Anginal pain ([place])    just occurs without any specific trigger; pt states has never had to use nitroglycerin tabs; uses rest to relieve  . Arthritis   . Asthma   . Diabetes mellitus    Type 1  . Dysrhythmia    "irregular heart beat"  . GERD (gastroesophageal reflux disease)   . History of urinary tract infection   . Hypercholesteremia   . Hypertension   . left renal ca dx'd 2012 (?)   surg only, left kidney  . Nocturia   . Reflux   . Renal failure (ARF), acute on chronic (HCC)   . Shortness of breath dyspnea    pt denies; states can climb stairs w/o difficulty   . Sleep apnea    does not use c-pap machine  . Tingling in extremities    legs bilat  . Tinnitus   . Urinary frequency   . Urinary incontinence   . Vertigo    occurs when lying flat      Patient Active Problem List   Diagnosis Date Noted  . Renal neoplasm 03/20/2015  . Right renal mass   . Chest pain with high risk for cardiac etiology 07/28/2014  . ACS (acute coronary syndrome) ([place]) 12/27/2010  . Diabetes mellitus 12/27/2010  . Hypertension 12/27/2010  . Hyperlipidemia 12/27/2010  . GERD (gastroesophageal reflux disease) 12/27/2010  . Renal cell adenocarcinoma ([place]) 12/27/2010  . Arthritis 12/27/2010  . Cervical stenosis of spine 12/27/2010  . Carpal tunnel syndrome 12/27/2010  . Asthma 12/27/2010    Past Surgical History:  Procedure Laterality Date  . ABDOMINAL HYSTERECTOMY    . APPENDECTOMY    . CARDIAC CATHETERIZATION N/A 07/28/2014   Procedure: Left Heart Cath and Coronary Angiography;  Surgeon: [name], MD;  Location: [facility];  Service: Cardiovascular;  Laterality: N/A;  . CYSTOSCOPY WITH RETROGRADE PYELOGRAM, URETEROSCOPY AND STENT PLACEMENT Right 02/09/2015   Procedure: CYSTOSCOPY WITH RETROGRADE PYELOGRAM AND URETEROSCOPY ;  Surgeon: Raynelle Bring, MD;  Location: [facility];  Service: Urology;  Laterality: Right;  . LAPAROSCOPIC NEPHRECTOMY Right 03/20/2015   Procedure: LAPAROSCOPIC NEPHRECTOMY;  Surgeon: Raynelle Bring, MD;  Location: [facility];  Service: Urology;  Laterality: Right;  . PERIPHERAL VASCULAR CATHETERIZATION N/A 07/28/2014   Procedure: Aortic Arch Angiography;  Surgeon: [name], MD;  Location: [facility];  Service: Cardiovascular;  Laterality: N/A;  . RENAL MASS EXCISION    . SPINE SURGERY      OB History    No data available       Home Medications    Prior to Admission medications   Medication Sig Start Date End Date Taking? Authorizing Provider  albuterol (PROVENTIL,VENTOLIN) 90 MCG/ACT inhaler Inhale 2 puffs into the lungs every 4 (four) hours as needed for wheezing or shortness of breath.    Yes Historical Provider, MD  amLODipine (NORVASC) 10 MG tablet Take 10 mg by mouth daily.   Yes Historical Provider, MD   atorvastatin (LIPITOR) 10 MG tablet Take 10 mg by mouth every morning.    Yes Historical Provider, MD  cholecalciferol (VITAMIN D) 1000 units tablet Take 1,000 Units by mouth daily.   Yes Historical Provider, MD  gabapentin (NEURONTIN) 300 MG capsule Take 1 capsule (300 mg total) by mouth at bedtime. Patient taking differently: Take 300 mg by mouth daily as needed (pain).  09/14/15  Yes Wallene Huh, DPM  Insulin Degludec (TRESIBA FLEXTOUCH) 200 UNIT/ML SOPN Inject 74 Units into the skin daily before breakfast.    Yes Historical Provider, MD  lisinopril (PRINIVIL,ZESTRIL) 40 MG tablet Take 40 mg by mouth every morning.    Yes Historical Provider, MD  metoprolol tartrate (LOPRESSOR) 25 MG tablet Take 25 mg by mouth 2 (two) times daily.   Yes Historical Provider, MD  mirabegron ER (MYRBETRIQ) 50 MG TB24 tablet Take 50 mg by mouth daily.   Yes Historical Provider, MD  esomeprazole (NEXIUM) 40 MG capsule Take 40 mg by mouth daily as needed (acid reflux).     Historical Provider, MD  nitroGLYCERIN (NITROSTAT) 0.4 MG SL tablet Place 0.4 mg under the tongue every 5 (five) minutes as needed for chest pain.    Historical Provider, MD    Family History Family History  Problem Relation Age of Onset  . Hypertension Mother   . Coronary artery disease      Social History Social History  Substance Use Topics  . Smoking status: Never Smoker  . Smokeless tobacco: Never Used  . Alcohol use No     Allergies   Sulfa antibiotics   Review of Systems Review of Systems  Constitutional: Negative for chills and fever.  Respiratory: Positive for cough. Negative for shortness of breath and wheezing.   Cardiovascular: Positive for chest pain.  Gastrointestinal: Negative for abdominal pain, nausea and vomiting.  Neurological: Positive for numbness. Negative for weakness.  All other systems reviewed and are negative.    Physical Exam Updated Vital Signs BP 146/71 (BP Location: Right Arm)   Pulse  (!) 57   Temp 98.4 F (36.9 C) (Oral)   Resp 24   SpO2 92%   Physical Exam  Constitutional: She is oriented to person, place, and time. She appears well-developed and well-nourished. No distress.  HENT:  Head: Normocephalic and atraumatic.  Eyes: Conjunctivae are normal. Pupils are equal, round, and reactive to light. Right eye exhibits no discharge. Left eye exhibits no discharge. No scleral icterus.  Neck: Normal range of motion. Neck supple.  Well healed midline scar  Cardiovascular: Normal rate, regular rhythm and intact distal pulses.  Exam reveals no gallop and no friction rub.   No murmur heard. Pulmonary/Chest: Effort normal and breath sounds normal. No respiratory distress. She has no wheezes. She has no rales. She exhibits no tenderness.  Abdominal: Soft. Bowel sounds are normal. She exhibits no distension.  There is no tenderness.  Musculoskeletal: She exhibits no edema.  Normal strength in bilateral upper extremities. N/V intact  Neurological: She is alert and oriented to person, place, and time.  Skin: Skin is warm and dry.  Psychiatric: She has a normal mood and affect. Her behavior is normal.  Nursing note and vitals reviewed.    ED Treatments / Results  Labs (all labs ordered are listed, but only abnormal results are displayed) Labs Reviewed  BASIC METABOLIC PANEL - Abnormal; Notable for the following:       Result Value   Glucose, Bld 175 (*)    BUN 35 (*)    Creatinine, Ser 1.99 (*)    GFR calc non Af Amer 23 (*)    GFR calc Af Amer 27 (*)    All other components within normal limits  CBC - Abnormal; Notable for the following:    HCT 35.2 (*)    All other components within normal limits  D-DIMER, QUANTITATIVE (NOT AT [facility])  I-STAT TROPOININ, ED    EKG  EKG Interpretation  Date/Time:  Monday October 23 2015 16:23:48 EDT Ventricular Rate:  59 PR Interval:  164 QRS Duration: 80 QT Interval:  402 QTC Calculation: 397 R Axis:   37 Text  Interpretation:  Sinus bradycardia Otherwise normal ECG No significant change since last tracing Confirmed by KNOTT MD, DANIEL [phone]) on 10/23/2015 6:12:47 PM       Radiology Dg Chest 2 View  Result Date: 10/23/2015 CLINICAL DATA:  Chest pain for 1 week.  Cough. EXAM: CHEST  2 VIEW COMPARISON:  October 18, 2015 FINDINGS: The heart size and mediastinal contours are within normal limits. Both lungs are clear. The visualized skeletal structures are unremarkable. IMPRESSION: No active cardiopulmonary disease. Electronically Signed   By: Dorise Bullion III M.D   On: 10/23/2015 16:51    Procedures Procedures (including critical care time)  Medications Ordered in ED Medications - No data to display   Initial Impression / Assessment and Plan / ED Course  I have reviewed the triage vital signs and the nursing notes.  Pertinent labs & imaging results that were available during my care of the patient were reviewed by me and considered in my medical decision making (see chart for details).  Clinical Course   75 year old female presents with chest pain. Unclear at this time as pain seems atypical but with her history and risk factors will have her admitted for ACS r/o. Patient is afebrile, not tachycardic or tachypneic, and not hypoxic. Mildly hypertensive. CBC unremarkable. CMP remarkable for SCr of 1.99 which is at baseline. Glucose is 175. Initial troponin is 0. EKG is sinus bradycardia. D-dimer is normal. CXR is normal. Spoke with Dr. Hal Hope who will admit patient for chest pain observation.   Final Clinical Impressions(s) / ED Diagnoses   Final diagnoses:  Left shoulder pain    New Prescriptions New Prescriptions   No medications on file     Recardo Evangelist, PA-C 10/24/15 0045    Leo Grosser, MD 10/25/15 [phone]

## 2015-10-23 NOTE — ED Notes (Signed)
Pt informed of room # change.  Will be going to 1437.  Pt verbalized understanding.

## 2015-10-23 NOTE — ED Notes (Signed)
Called lab, spoke with [facility] & confirmed there is a blue tube to complete the D-dimer.

## 2015-10-23 NOTE — ED Notes (Signed)
Pt assisted to BR.

## 2015-10-23 NOTE — ED Triage Notes (Signed)
Pt reports chest pain radiating to left arm and back . Also left arm numbness. sts shortness of breath x 1 week. Hx hypertension. Alert and oriented x 4.

## 2015-10-23 NOTE — H&P (Signed)
History and Physical    [name] [id number] DOB: 03/26/1940 DOA: 10/23/2015  PCP: Maggie Font, MD  Patient coming from: Home.  Chief Complaint: Chest pain.  HPI: [name] is a 75 y.o. female with diabetes mellitus type 2, hypertension, hyperlipidemia, history of renal cell carcinoma status post nephrectomy with only partial left kidney and left presents with chest pain. Patient states over the last 1 week patient has been having chest pain. Denies any exertional symptoms. On my exam patient states pain is mostly on any movements and particularly shoulder movements. Patient is also tender on exam on the anterior chest wall. EKG, d-dimer, troponins and chest x-ray were unremarkable. On trying to abduct the left shoulder patient's pain worsened. Patient is being admitted for further observation since patient has significant pain. Denies any shortness of breath nausea vomiting abdominal pain and productive cough fever or chills.  ED Course: See history of presenting illness.  Review of Systems: As per HPI, rest all negative.   Past Medical History:  Diagnosis Date  . Anginal pain ([place])    just occurs without any specific trigger; pt states has never had to use nitroglycerin tabs; uses rest to relieve  . Arthritis   . Asthma   . Diabetes mellitus    Type 1  . Dysrhythmia    "irregular heart beat"  . GERD (gastroesophageal reflux disease)   . History of urinary tract infection   . Hypercholesteremia   . Hypertension   . left renal ca dx'd 2012 (?)   surg only, left kidney  . Nocturia   . Reflux   . Renal failure (ARF), acute on chronic (HCC)   . Shortness of breath dyspnea    pt denies; states can climb stairs w/o difficulty   . Sleep apnea    does not use c-pap machine  . Tingling in extremities    legs bilat  . Tinnitus   . Urinary frequency   . Urinary incontinence   . Vertigo    occurs when lying flat     Past Surgical History:  Procedure Laterality  Date  . ABDOMINAL HYSTERECTOMY    . APPENDECTOMY    . CARDIAC CATHETERIZATION N/A 07/28/2014   Procedure: Left Heart Cath and Coronary Angiography;  Surgeon: [name], MD;  Location: [facility];  Service: Cardiovascular;  Laterality: N/A;  . CYSTOSCOPY WITH RETROGRADE PYELOGRAM, URETEROSCOPY AND STENT PLACEMENT Right 02/09/2015   Procedure: CYSTOSCOPY WITH RETROGRADE PYELOGRAM AND URETEROSCOPY ;  Surgeon: Raynelle Bring, MD;  [facility];  Service: Urology;  Laterality: Right;  . LAPAROSCOPIC NEPHRECTOMY Right 03/20/2015   Procedure: LAPAROSCOPIC NEPHRECTOMY;  Surgeon: Raynelle Bring, MD;  Location: [facility];  Service: Urology;  Laterality: Right;  . PERIPHERAL VASCULAR CATHETERIZATION N/A 07/28/2014   Procedure: Aortic Arch Angiography;  Surgeon: [name], MD;  Location: [facility];  Service: Cardiovascular;  Laterality: N/A;  . RENAL MASS EXCISION    . SPINE SURGERY       reports that she has never smoked. She has never used smokeless tobacco. She reports that she does not drink alcohol or use drugs.  Allergies  Allergen Reactions  . Sulfa Antibiotics Itching    Family History  Problem Relation Age of Onset  . Hypertension Mother   . Coronary artery disease      Prior to Admission medications   Medication Sig Start Date End Date Taking? Authorizing Provider  albuterol (PROVENTIL,VENTOLIN) 90 MCG/ACT inhaler Inhale 2  puffs into the lungs every 4 (four) hours as needed for wheezing or shortness of breath.    Yes Historical Provider, MD  amLODipine (NORVASC) 10 MG tablet Take 10 mg by mouth daily.   Yes Historical Provider, MD  atorvastatin (LIPITOR) 10 MG tablet Take 10 mg by mouth every morning.    Yes Historical Provider, MD  cholecalciferol (VITAMIN D) 1000 units tablet Take 1,000 Units by mouth daily.   Yes Historical Provider, MD  gabapentin (NEURONTIN) 300 MG capsule Take 1 capsule (300 mg total) by mouth at bedtime. Patient taking differently: Take 300 mg by  mouth daily as needed (pain).  09/14/15  Yes Wallene Huh, DPM  Insulin Degludec (TRESIBA FLEXTOUCH) 200 UNIT/ML SOPN Inject 74 Units into the skin daily before breakfast.    Yes Historical Provider, MD  lisinopril (PRINIVIL,ZESTRIL) 40 MG tablet Take 40 mg by mouth every morning.    Yes Historical Provider, MD  metoprolol tartrate (LOPRESSOR) 25 MG tablet Take 25 mg by mouth 2 (two) times daily.   Yes Historical Provider, MD  mirabegron ER (MYRBETRIQ) 50 MG TB24 tablet Take 50 mg by mouth daily.   Yes Historical Provider, MD  esomeprazole (NEXIUM) 40 MG capsule Take 40 mg by mouth daily as needed (acid reflux).     Historical Provider, MD  nitroGLYCERIN (NITROSTAT) 0.4 MG SL tablet Place 0.4 mg under the tongue every 5 (five) minutes as needed for chest pain.    Historical Provider, MD    Physical Exam: Vitals:   10/23/15 2100 10/23/15 2110 10/23/15 2130 10/23/15 2200  BP: 156/78  144/76 150/92  Pulse: (!) 58  71 68  Resp:   18 17  Temp:  98.9 F (37.2 C)    TempSrc:  Oral    SpO2: 97%  96% 99%      Constitutional: Not in distress. Vitals:   10/23/15 2100 10/23/15 2110 10/23/15 2130 10/23/15 2200  BP: 156/78  144/76 150/92  Pulse: (!) 58  71 68  Resp:   18 17  Temp:  98.9 F (37.2 C)    TempSrc:  Oral    SpO2: 97%  96% 99%   Eyes: Anicteric no pallor. ENMT: No discharge from the ears eyes nose or mouth. Neck: No mass felt. No JVD appreciated. Respiratory: No rhonchi or crepitations. Cardiovascular: S1-S2 heard. Abdomen: Soft nontender bowel sounds present. Musculoskeletal: Pain on moving left shoulder. There is also tenderness in the anterior chest wall. Skin: No rash. Neurologic: Alert awake oriented to time place and person. Moves all extremities. Psychiatric: Appears normal.   Labs on Admission: I have personally reviewed following labs and imaging studies  CBC:  Recent Labs Lab 10/23/15 1618  WBC 4.4  HGB 12.1  HCT 35.2*  MCV 88.0  PLT 123XX123   Basic  Metabolic Panel:  Recent Labs Lab 10/23/15 1618  NA 138  K 4.2  CL 108  CO2 25  GLUCOSE 175*  BUN 35*  CREATININE 1.99*  CALCIUM 9.5   GFR: CrCl cannot be calculated (Unknown ideal weight.). Liver Function Tests: No results for input(s): AST, ALT, ALKPHOS, BILITOT, PROT, ALBUMIN in the last 168 hours. No results for input(s): LIPASE, AMYLASE in the last 168 hours. No results for input(s): AMMONIA in the last 168 hours. Coagulation Profile: No results for input(s): INR, PROTIME in the last 168 hours. Cardiac Enzymes: No results for input(s): CKTOTAL, CKMB, CKMBINDEX, TROPONINI in the last 168 hours. BNP (last 3 results) No results for input(s): PROBNP in  the last 8760 hours. HbA1C: No results for input(s): HGBA1C in the last 72 hours. CBG: No results for input(s): GLUCAP in the last 168 hours. Lipid Profile: No results for input(s): CHOL, HDL, LDLCALC, TRIG, CHOLHDL, LDLDIRECT in the last 72 hours. Thyroid Function Tests: No results for input(s): TSH, T4TOTAL, FREET4, T3FREE, THYROIDAB in the last 72 hours. Anemia Panel: No results for input(s): VITAMINB12, FOLATE, FERRITIN, TIBC, IRON, RETICCTPCT in the last 72 hours. Urine analysis:    Component Value Date/Time   COLORURINE YELLOW 03/25/2015 1323   APPEARANCEUR CLOUDY (A) 03/25/2015 1323   LABSPEC 1.013 03/25/2015 1323   PHURINE 6.5 03/25/2015 1323   GLUCOSEU >1000 (A) 03/25/2015 1323   HGBUR TRACE (A) 03/25/2015 1323   BILIRUBINUR NEGATIVE 03/25/2015 1323   KETONESUR NEGATIVE 03/25/2015 1323   PROTEINUR NEGATIVE 03/25/2015 1323   UROBILINOGEN 0.2 12/26/2014 1220   NITRITE NEGATIVE 03/25/2015 1323   LEUKOCYTESUR SMALL (A) 03/25/2015 1323   Sepsis Labs: @LABRCNTIP (procalcitonin:4,lacticidven:4) )No results found for this or any previous visit (from the past 240 hour(s)).   Radiological Exams on Admission: Dg Chest 2 View  Result Date: 10/23/2015 CLINICAL DATA:  Chest pain for 1 week.  Cough. EXAM: CHEST  2  VIEW COMPARISON:  October 18, 2015 FINDINGS: The heart size and mediastinal contours are within normal limits. Both lungs are clear. The visualized skeletal structures are unremarkable. IMPRESSION: No active cardiopulmonary disease. Electronically Signed   By: Dorise Bullion III M.D   On: 10/23/2015 16:51    EKG: Independently reviewed. Sinus bradycardia with rate around 59 bpm.  Assessment/Plan Principal Problem:   Chest pain Active Problems:   Hypertension   Hyperlipidemia   Renal cell adenocarcinoma (HCC)   Diabetes mellitus type 2 in obese ([place])    1. Chest pain - appears atypical and musculoskeletal in nature. Pain increases on moving the left shoulder. There is also tenderness in the anterior chest wall. Patient denies any fall. Chest x-ray does not show any mass. Since pain increases considerably on moving the left shoulder I will get x-ray of the left shoulder. Since patient also has cardiac risk factors we will cycle cardiac markers. Has had unremarkable cardiac cath (by Dr. Einar Gip) in June 2016. Check CK level, sedimentation rate for any rheumatological process. Patient's d-dimer was negative. 2. Chronic kidney disease stage IV with only partial left kidney left after nephrectomy for renal cell carcinoma and has had recent right renal nephrectomy in February 2017 - avoid nephrotoxins. Closely follow metabolic panel. 3. Diabetes mellitus type 2 - on Tresiba. Follow CBGs closely with sliding scale coverage. 4. Hyperlipidemia on statins. 5. Hypertension on amlodipine and lisinopril and metoprolol. Note that patient has chronic kidney disease and is on lisinopril.   DVT prophylaxis: Lovenox. Code Status: Full code.  Family Communication: Discussed with patient.  Disposition Plan: Home.  Consults called: None.  Admission status: Observation.    Rise Patience MD [facility] Pager [phone].  If 7PM-7AM, please contact night-coverage [email or web address] Password  [email or web address]  10/23/2015, 10:15 PM

## 2015-10-24 ENCOUNTER — Observation Stay (HOSPITAL_BASED_OUTPATIENT_CLINIC_OR_DEPARTMENT_OTHER): Payer: Medicare Other

## 2015-10-24 ENCOUNTER — Observation Stay (HOSPITAL_COMMUNITY): Payer: Medicare Other

## 2015-10-24 DIAGNOSIS — C649 Malignant neoplasm of unspecified kidney, except renal pelvis: Secondary | ICD-10-CM

## 2015-10-24 DIAGNOSIS — R079 Chest pain, unspecified: Secondary | ICD-10-CM | POA: Diagnosis not present

## 2015-10-24 DIAGNOSIS — I10 Essential (primary) hypertension: Secondary | ICD-10-CM

## 2015-10-24 DIAGNOSIS — S46812A Strain of other muscles, fascia and tendons at shoulder and upper arm level, left arm, initial encounter: Secondary | ICD-10-CM | POA: Diagnosis not present

## 2015-10-24 DIAGNOSIS — M4802 Spinal stenosis, cervical region: Secondary | ICD-10-CM | POA: Diagnosis not present

## 2015-10-24 LAB — GLUCOSE, CAPILLARY
GLUCOSE-CAPILLARY: 264 mg/dL — AB (ref 65–99)
GLUCOSE-CAPILLARY: 211 mg/dL — AB (ref 65–99)
Glucose-Capillary: 70 mg/dL (ref 65–99)
Glucose-Capillary: 246 mg/dL — ABNORMAL HIGH (ref 65–99)
Glucose-Capillary: 236 mg/dL — ABNORMAL HIGH (ref 65–99)

## 2015-10-24 LAB — TROPONIN I

## 2015-10-24 LAB — ECHOCARDIOGRAM COMPLETE
Height: 59 in
WEIGHTICAEL: 2359.8 [oz_av]

## 2015-10-24 LAB — SEDIMENTATION RATE: SED RATE: 36 mm/h — AB (ref 0–22)

## 2015-10-24 MED ORDER — ENOXAPARIN SODIUM 30 MG/0.3ML ~~LOC~~ SOLN
30.0000 mg | Freq: Every day | SUBCUTANEOUS | Status: DC
  Administered 2015-10-24: 30 mg via SUBCUTANEOUS
  Filled 2015-10-24: qty 0.3

## 2015-10-24 MED ORDER — HYDROCODONE-ACETAMINOPHEN 5-325 MG PO TABS: 1.0000 | ORAL_TABLET | ORAL | Status: DC | PRN

## 2015-10-24 NOTE — Progress Notes (Signed)
Patient ID: [name], female   DOB: 09/11/1940, 75 y.o.   MRN: [id number]    PROGRESS NOTE    [name]  [id number] DOB: 01-21-1941 DOA: 10/23/2015  PCP: Maggie Font, MD   Brief Narrative:   75 y.o. female with diabetes mellitus type 2, hypertension, hyperlipidemia, history of renal cell carcinoma status post nephrectomy with only partial left kidney, presented with chest pain, neck pain and left shoulder pain that was associated with left arm numbness and tingling.  Assessment & Plan:   Principal Problem:   Chest pain, neck and left shoulder pain  - no chest pain this AM, CE x 3 negative, no events on tele - ECHO pending - worried that the pain is musculoskeletal and possibly caused by nerve impingement at the cervical level  - keep on tele for now  - proceed with shoulder and neck MRI for further evaluation   Active Problems:   Hypertension, essential  - reasonable inpatient control     Hyperlipidemia - continue statin     Renal cell adenocarcinoma ([place]), CKD stage II - III - follows with Dr. Alinda Money - will notify him of pt's admission  - Cr stable so far - BMP in AM    Diabetes mellitus type 2 in obese [facility]) with complications of nephropathy  - Body mass index is 29.79 kg/m. - CBG stable so far   DVT prophylaxis: Lovenox SQ Code Status: Full  Family Communication: Patient at bedside  Disposition Plan: Home in AM  Consultants:   None  Procedures:   None  Antimicrobials:   None   Subjective: Still with left shoulder and neck pain.   Objective: Vitals:   10/23/15 2200 10/23/15 2231 10/24/15 0214 10/24/15 0610  BP: 150/92 138/78 (!) 118/91 125/78  Pulse: 68 62 62 63  Resp: 17 16 18 18   Temp:  98.6 F (37 C) 98.2 F (36.8 C) 98.2 F (36.8 C)  TempSrc:  Oral Oral Oral  SpO2: 99% 100% 98% 97%  Weight:  66.9 kg (147 lb 7.8 oz)    Height:  4\' 11"  (1.499 m)      Intake/Output Summary (Last 24 hours) at 10/24/15 1729 Last data filed  at 10/24/15 0850  Gross per 24 hour  Intake              480 ml  Output                0 ml  Net              480 ml   Filed Weights   10/23/15 2231  Weight: 66.9 kg (147 lb 7.8 oz)    Examination:  General exam: Appears calm and comfortable  Respiratory system: Clear to auscultation. Respiratory effort normal. Cardiovascular system: S1 & S2 heard, RRR. No JVD, murmurs, rubs, gallops or clicks. No pedal edema. Gastrointestinal system: Abdomen is nondistended, soft and nontender. No organomegaly or masses felt. Central nervous system: Alert and oriented. No focal neurological deficits. Extremities: Symmetric 5 x 5 power.  Data Reviewed: I have personally reviewed following labs and imaging studies  CBC:  Recent Labs Lab 10/23/15 1618  WBC 4.4  HGB 12.1  HCT 35.2*  MCV 88.0  PLT 123XX123   Basic Metabolic Panel:  Recent Labs Lab 10/23/15 1618  NA 138  K 4.2  CL 108  CO2 25  GLUCOSE 175*  BUN 35*  CREATININE 1.99*  CALCIUM 9.5   Cardiac Enzymes:  Recent  Labs Lab 10/23/15 2253 10/24/15 0415 10/24/15 0940  CKTOTAL 85  --   --   TROPONINI <0.03 <0.03 <0.03   CBG:  Recent Labs Lab 10/23/15 2244 10/24/15 0637 10/24/15 0813 10/24/15 1214 10/24/15 1657  GLUCAP 98 70 246* 236* 264*   Urine analysis:    Component Value Date/Time   COLORURINE YELLOW 03/25/2015 1323   APPEARANCEUR CLOUDY (A) 03/25/2015 1323   LABSPEC 1.013 03/25/2015 1323   PHURINE 6.5 03/25/2015 1323   GLUCOSEU >1000 (A) 03/25/2015 1323   HGBUR TRACE (A) 03/25/2015 1323   BILIRUBINUR NEGATIVE 03/25/2015 1323   KETONESUR NEGATIVE 03/25/2015 1323   PROTEINUR NEGATIVE 03/25/2015 1323   UROBILINOGEN 0.2 12/26/2014 1220   NITRITE NEGATIVE 03/25/2015 1323   LEUKOCYTESUR SMALL (A) 03/25/2015 1323   Radiology Studies: Dg Chest 2 View  Result Date: 10/23/2015 CLINICAL DATA:  Chest pain for 1 week.  Cough. EXAM: CHEST  2 VIEW COMPARISON:  October 18, 2015 FINDINGS: The heart size and  mediastinal contours are within normal limits. Both lungs are clear. The visualized skeletal structures are unremarkable. IMPRESSION: No active cardiopulmonary disease. Electronically Signed   By: Dorise Bullion III M.D   On: 10/23/2015 16:51   Dg Shoulder Left  Result Date: 10/23/2015 CLINICAL DATA:  75 year old female with left shoulder pain. No known injury. EXAM: LEFT SHOULDER - 2+ VIEW COMPARISON:  Chest radiograph dated 10/23/2015 FINDINGS: There is no acute fracture or dislocation. The bones are mildly osteopenic. No significant arthritic changes. There is a small well corticated bony fragment superior to the left humeral head and inferior to the acromion likely representing an intra-articular fragment. The soft tissues appear unremarkable. IMPRESSION: No acute fracture or dislocation. Small intra-articular fragment superior to the humeral head. Electronically Signed   By: Anner Crete M.D.   On: 10/23/2015 23:00    Scheduled Meds: . amLODipine  10 mg Oral Daily  . aspirin EC  325 mg Oral Daily  . atorvastatin  10 mg Oral q morning - 10a  . cholecalciferol  1,000 Units Oral Daily  . enoxaparin (LOVENOX) injection  30 mg Subcutaneous QHS  . insulin aspart  0-9 Units Subcutaneous TID WC  . insulin glargine  74 Units Subcutaneous QAC breakfast  . lisinopril  40 mg Oral q morning - 10a  . metoprolol tartrate  25 mg Oral BID  . mirabegron ER  50 mg Oral Daily  . pantoprazole  40 mg Oral Daily   Continuous Infusions:    LOS: 0 days    Time spent: 20 minutes    Faye Ramsay, MD [facility] Pager [phone]  If 7PM-7AM, please contact night-coverage [email or web address] Password [email or web address] 10/24/2015, 5:29 PM

## 2015-10-24 NOTE — Progress Notes (Signed)
  Echocardiogram 2D Echocardiogram has been performed.  [name] 10/24/2015, 12:40 PM

## 2015-10-24 NOTE — Care Management Obs Status (Signed)
[place] NOTIFICATION   Patient Details  Name: DEMELZA COCKRILL MRN: [id number] Date of Birth: 09-06-1940   Medicare Observation Status Notification Given:  Yes    Dellie Catholic, RN 10/24/2015, 12:42 PM

## 2015-10-24 NOTE — Care Management Note (Signed)
Case Management Note  Patient Details  Name: [name] MRN: [id number] Date of Birth: 03-13-1940  Subjective/Objective:                  Chest pain. Action/Plan: Discharge planning Expected Discharge Date:                  Expected Discharge Plan:  Home/Self Care  In-House Referral:     Discharge planning Services  CM Consult  Post Acute Care Choice:    Choice offered to:  Patient  DME Arranged:  [place] rolling with seat DME Agency:  [place]:    [place] Agency:     Status of Service:  In process, will continue to follow  If discussed at Long Length of Stay Meetings, dates discussed:    Additional Comments: CM met with pt in room to discuss home needs.  Pt states she lives alone and is independent with all ADLs and expects to return home with no HH services needed.  Though pt is indep with ambulation, Pt states she could use a rollator for when she gets tired and CM has requested this DME order.  CM will continue to follow as pt progresses.   Dellie Catholic, RN 10/24/2015, 12:43 PM

## 2015-10-25 DIAGNOSIS — Z85528 Personal history of other malignant neoplasm of kidney: Secondary | ICD-10-CM | POA: Diagnosis not present

## 2015-10-25 DIAGNOSIS — M2560 Stiffness of unspecified joint, not elsewhere classified: Secondary | ICD-10-CM | POA: Diagnosis not present

## 2015-10-25 DIAGNOSIS — M25512 Pain in left shoulder: Secondary | ICD-10-CM | POA: Diagnosis not present

## 2015-10-25 DIAGNOSIS — R079 Chest pain, unspecified: Secondary | ICD-10-CM | POA: Diagnosis not present

## 2015-10-25 DIAGNOSIS — I10 Essential (primary) hypertension: Secondary | ICD-10-CM | POA: Diagnosis not present

## 2015-10-25 LAB — BASIC METABOLIC PANEL
Anion gap: 6 (ref 5–15)
BUN: 29 mg/dL — AB (ref 6–20)
CALCIUM: 9.1 mg/dL (ref 8.9–10.3)
CO2: 24 mmol/L (ref 22–32)
CREATININE: 2.11 mg/dL — AB (ref 0.44–1.00)
Chloride: 109 mmol/L (ref 101–111)
GFR calc Af Amer: 25 mL/min — ABNORMAL LOW (ref >60)
GFR, EST NON AFRICAN AMERICAN: 21 mL/min — AB (ref >60)
GLUCOSE: 118 mg/dL — AB (ref 65–99)
Potassium: 4.1 mmol/L (ref 3.5–5.1)
SODIUM: 139 mmol/L (ref 135–145)

## 2015-10-25 LAB — GLUCOSE, CAPILLARY
GLUCOSE-CAPILLARY: 71 mg/dL (ref 65–99)
Glucose-Capillary: 107 mg/dL — ABNORMAL HIGH (ref 65–99)
Glucose-Capillary: 91 mg/dL (ref 65–99)

## 2015-10-25 LAB — CBC
HCT: 35.4 % — ABNORMAL LOW (ref 36.0–46.0)
Hemoglobin: 11.8 g/dL — ABNORMAL LOW (ref 12.0–15.0)
MCH: 29.7 pg (ref 26.0–34.0)
MCHC: 33.3 g/dL (ref 30.0–36.0)
MCV: 89.2 fL (ref 78.0–100.0)
PLATELETS: 201 10*3/uL (ref 150–400)
RBC: 3.97 MIL/uL (ref 3.87–5.11)
RDW: 13.7 % (ref 11.5–15.5)
WBC: 5.2 10*3/uL (ref 4.0–10.5)

## 2015-10-25 MED ORDER — ZOLPIDEM TARTRATE 5 MG PO TABS: 5.0000 mg | ORAL_TABLET | Freq: Every evening | ORAL | 0 refills | Status: DC | PRN

## 2015-10-25 MED ORDER — HYDROCODONE-ACETAMINOPHEN 5-325 MG PO TABS: 1.0000 | ORAL_TABLET | ORAL | 0 refills | Status: DC | PRN

## 2015-10-25 NOTE — Progress Notes (Addendum)
Inpatient Diabetes Program Recommendations  AACE/ADA: New Consensus Statement on Inpatient Glycemic Control (2015)  Target Ranges:  Prepandial:   less than 140 mg/dL      Peak postprandial:   less than 180 mg/dL (1-2 hours)      Critically ill patients:  140 - 180 mg/dL   Results for VENISSA, JASA (MRN [id number]) as of 10/25/2015 10:16  Ref. Range 10/24/2015 06:37 10/24/2015 08:13 10/24/2015 12:14 10/24/2015 16:57 10/24/2015 20:03  Glucose-Capillary Latest Ref Range: 65 - 99 mg/dL 70 246 (H) 236 (H) 264 (H) 211 (H)   Results for DEARIA, TRUMBAUER (MRN [id number]) as of 10/25/2015 10:16  Ref. Range 10/25/2015 05:02 10/25/2015 07:54  Glucose-Capillary Latest Ref Range: 65 - 99 mg/dL 107 (H) 68    Admit with: CP  History: DM, CKD4, Renal Cell Carcinoma  Home DM Meds: [place] Insulin 74 units daily  Current Insulin Orders: Lantus 74 units daily      Novolog Sensitive Correction Scale/ SSI (0-9 units) TID AC      MD- Please consider the following in-hospital insulin adjustments:  1. Reduce Lantus to 68 units daily (fasting glucose borderline HYPO the last two mornings)  (this would be a 10% reduction of current dose)  2. Start low dose Novolog Meal Coverage: Novolog 3 units tid with meals (hold if pt eats <50% of meal)    Addendum 1pm: Spoke with pt today per pt request.  Pt had questions about her CBGs at home and questions about the insulin she is taking at home.  Discussed with patient what Tyler Aas insulin is and how it works (basal insulin).  Patient told me she used to take Humulin 70/30 insulin bid at home but then was switched to [place] insulin once daily b/c her CBGs were staying high (sees Dr. Berdine Addison and Dr. Criss Rosales).  Patient stated she often has lots of high CBGs as well as lots of Hypoglycemic events at home.  Told me she feels the need to keep candy nearby in case she has lows b/c she seems to have lows quite a bit.  Spoke with pt about the fact that her AM CBGs have been borderline low  here in the hospital while receiving her home dose of basal insulin.  Discussed with patient that this may indicate that her home dose of [place] Aas may be too much.  Explained to pt that we often assess the fasting glucose to see if the basal insulin is dosed appropriately.  Since pt's CBGs are borderline low and her postprandial CBGs are elevated, she may be on too much [place] and she may need to start a quick acting insulin like Novolog at home to cover her food intake.  Discussed with patient that the 70/30 insulin that she used to take had a long-acting insulin and a short-acting insulin component that helped cover both her basal needs and her meal times.  Encouraged pt to seek a referral from her PCP to a local Endocrinologist to help better manager her CBGs.  Patient stated she has thought about this before and felt like now might be the right time to go to a DM specialist.  Encouraged pt to continue to check her CBGs frequently as well.      --Will follow patient during hospitalization--  Wyn Quaker RN, MSN, CDE Diabetes Coordinator Inpatient Glycemic Control Team Team Pager: [phone] (8a-5p)

## 2015-10-25 NOTE — Discharge Instructions (Signed)
Tendinitis °Tendinitis is swelling and inflammation of the tendons. Tendons are band-like tissues that connect muscle to bone. Tendinitis commonly occurs in the:  °· Shoulders (rotator cuff). °· Heels (Achilles tendon). °· Elbows (triceps tendon). °CAUSES °Tendinitis is usually caused by overusing the tendon, muscles, and joints involved. When the tissue surrounding a tendon (synovium) becomes inflamed, it is called tenosynovitis. Tendinitis commonly develops in people whose jobs require repetitive motions. °SYMPTOMS °· Pain. °· Tenderness. °· Mild swelling. °DIAGNOSIS °Tendinitis is usually diagnosed by physical exam. Your health care provider may also order X-rays or other imaging tests. °TREATMENT °Your health care provider may recommend certain medicines or exercises for your treatment. °HOME CARE INSTRUCTIONS  °· Use a sling or splint for as long as directed by your health care provider until the pain decreases. °· Put ice on the injured area. °¨ Put ice in a plastic bag. °¨ Place a towel between your skin and the bag. °¨ Leave the ice on for 15-20 minutes, 3-4 times a day, or as directed by your health care provider. °· Avoid using the limb while the tendon is painful. Perform gentle range of motion exercises only as directed by your health care provider. Stop exercises if pain or discomfort increase, unless directed otherwise by your health care provider. °· Only take over-the-counter or prescription medicines for pain, discomfort, or fever as directed by your health care provider. °SEEK MEDICAL CARE IF:  °· Your pain and swelling increase. °· You develop new, unexplained symptoms, especially increased numbness in the hands. °MAKE SURE YOU:  °· Understand these instructions. °· Will watch your condition. °· Will get help right away if you are not doing well or get worse. °  °This information is not intended to replace advice given to you by your health care provider. Make sure you discuss any questions you  have with your health care provider. °  °Document Released: 02/02/2000 Document Revised: 02/25/2014 Document Reviewed: 04/23/2010 °[employer]vier Interactive Patient Education ©2016 [employer] ° °

## 2015-10-25 NOTE — Discharge Summary (Signed)
Physician Discharge Summary  [name] [id number] DOB: 09-27-1940 DOA: 10/23/2015  PCP: Maggie Font, MD  Admit date: 10/23/2015 Discharge date: 10/25/2015  Recommendations for Outpatient Follow-up:  1. Pt will need to follow up with PCP in 2-3 weeks post discharge 2. Please obtain BMP to evaluate electrolytes and kidney function 3. Please also check CBC to evaluate Hg and Hct levels 4. Pt has an appointment scheduled with Dr. Lewis Moccasin tomorrow am for evaluation of left shoulder tendinitis    Discharge Diagnoses:  Principal Problem:   Chest pain Active Problems:   Hypertension   Hyperlipidemia   Renal cell adenocarcinoma ([place])   Diabetes mellitus type 2 in obese [facility])   Discharge Condition: Stable  Diet recommendation: Heart healthy diet discussed in details   Brief Narrative:  75 y.o.femalewith diabetes mellitus type 2, hypertension, hyperlipidemia, history of renal cell carcinoma status post nephrectomy with only partialleft kidney, presented with chest pain, neck pain and left shoulder pain that was associated with left arm numbness and tingling.  Assessment & Plan:   Principal Problem:   Chest pain, neck and left shoulder pain  - no chest pain this AM, CE x 3 negative, no events on tele - ECHO with stable EF - pain is likely musculoskeletal, MRI of the shoulder below   Active Problems:   Left shoulder tendinitis with multilevel neural foraminal narrowing  - MRI of the left shoulder with severe tendinosis of the supraspinatus tendon with a small interstitial tear. Moderate tendinosis of the infraspinatus tendon with fraying along the bursal surface.  - appointment scheduled for tomorrow with Dr. Lewis Moccasin  - MRI cervical spine with status post C5-6 and C6-7 ACDF and posterior decompression, Multilevel neural foraminal narrowing: Moderate to severe on the LEFT at C4-5 most compatible with adjacent segment disease. Degenerative upper thoracic spine resulting in  multilevel moderate to severe/severe neural foraminal narrowing    Hypertension, essential  - reasonable inpatient control     Hyperlipidemia - continue statin     Renal cell adenocarcinoma ([place]), CKD stage II - III - follows with Dr. Alinda Money - will notify him of pt's admission     Diabetes mellitus type 2 in obese [facility]) with complications of nephropathy  - Body mass index is 29.79 kg/m. - CBG stable so far   DVT prophylaxis: Lovenox SQ Code Status: Full  Family Communication: Patient at bedside  Disposition Plan: Home   Consultants:   None  Procedures:   None  Antimicrobials:   None  Procedures/Studies: Dg Chest 2 View  Result Date: 10/23/2015 CLINICAL DATA:  Chest pain for 1 week.  Cough. EXAM: CHEST  2 VIEW COMPARISON:  October 18, 2015 FINDINGS: The heart size and mediastinal contours are within normal limits. Both lungs are clear. The visualized skeletal structures are unremarkable. IMPRESSION: No active cardiopulmonary disease. Electronically Signed   By: Dorise Bullion III M.D   On: 10/23/2015 16:51   Dg Chest 2 View  Result Date: 10/18/2015 CLINICAL DATA:  Bilateral renal cell carcinoma. EXAM: CHEST  2 VIEW COMPARISON:  03/25/2015. FINDINGS: Trachea is midline. Heart size normal. Lungs are clear. No pleural fluid. Postoperative changes are seen in the neck. Mitek anchor in the right humeral head. Degenerative changes are seen in the spine and acromioclavicular joints. IMPRESSION: No acute findings.  No evidence of metastatic disease. Electronically Signed   By: Lorin Picket M.D.   On: 10/18/2015 14:34   Mr Cervical Spine Wo Contrast  Result Date: 10/24/2015 CLINICAL  DATA:  Neck and LEFT shoulder pain, LEFT arm numbness and tingling. History of renal cell carcinoma, diabetes, hypertension. EXAM: MRI CERVICAL SPINE WITHOUT CONTRAST TECHNIQUE: Multiplanar, multisequence MR imaging of the cervical spine was performed. No intravenous contrast was administered.  COMPARISON:  CT cervical spine June 08, 2014 FINDINGS: Multiple sequences are mildly or moderately motion degraded. ALIGNMENT: Straightened cervical lordosis.  No malalignment. VERTEBRAE/DISCS: Vertebral bodies are intact. Status post C5-6 and C6-7 ACDF, C6-7 facet screws; hardware results in local susceptibility defect. Arthrodesis better demonstrated on prior CT. Similar moderate C3-4 disc height loss, mild at C4-5. Decreased T2 signal within all non surgically altered disc compatible with desiccation. Mild chronic discogenic endplate changes [id number] and C4-5. No STIR signal abnormality to suggest a acute process in the cervical spine. However, moderate T1-2, T2-3 and T3-4 disc bulges, associated with acute on chronic discogenic endplate changes. No upper thoracic canal stenosis. Multilevel moderate to severe/ severe neural foraminal narrowing T1-2 through T3-4. CORD:Cervical spinal cord is normal morphology and signal characteristics from the cervicomedullary junction to level of T1-2, the most caudal well visualized level. POSTERIOR FOSSA, VERTEBRAL ARTERIES, PARASPINAL TISSUES: No MR findings of ligamentous injury. Vertebral artery flow voids present. Included posterior fossa and paraspinal soft tissues are nonacute; posterior perispinal scarring. DISC LEVELS: C2-3:  No disc bulge, canal stenosis nor neural foraminal narrowing. C3-4: Moderate broad-based disc bulge, uncovertebral hypertrophy. Mild canal stenosis. Moderate bilateral neural foraminal narrowing. C4-5: Uncovertebral hypertrophy. Severe LEFT and mild RIGHT facet arthropathy. No canal stenosis. Mild RIGHT and moderate to severe LEFT neural foraminal narrowing. C5-6, C6-7: ACDF, posterior decompression without canal stenosis. Neural foramen obscured by hardware artifact. C7-T1: No disc bulge. Mild facet arthropathy without canal stenosis. Moderate bilateral suspected neural foraminal narrowing may be overestimated by hardware artifact. IMPRESSION:  Status post C5-6 and C6-7 ACDF and posterior decompression. No acute fracture or malalignment. Mild canal stenosis C3-4. Multilevel neural foraminal narrowing: Moderate to severe on the LEFT at C4-5 most compatible with adjacent segment disease. Degenerative upper thoracic spine resulting in multilevel moderate to severe/severe neural foraminal narrowing. Electronically Signed   By: Elon Alas M.D.   On: 10/24/2015 21:57   Mr Shoulder Left Wo Contrast  Result Date: 10/25/2015 CLINICAL DATA:  Left shoulder pain. History of renal cell carcinoma. Limited range of motion. EXAM: MRI OF THE LEFT SHOULDER WITHOUT CONTRAST TECHNIQUE: Multiplanar, multisequence MR imaging of the shoulder was performed. No intravenous contrast was administered. COMPARISON:  None. FINDINGS: Rotator cuff: Severe tendinosis of the supraspinatus tendon with a small interstitial tear. Moderate tendinosis of the infraspinatus tendon with fraying along the bursal surface. Teres minor tendon is intact. Mild tendinosis of the subscapularis tendon. Muscles: No atrophy or fatty replacement of nor abnormal signal within, the muscles of the rotator cuff. Biceps long head:  Intact. Acromioclavicular Joint: Mild arthropathy of the acromioclavicular joint. Type II acromion. No significant subacromial/ subdeltoid bursal fluid. Glenohumeral Joint: No joint effusion.  No chondral defect. Labrum: Grossly intact, but evaluation is limited by lack of intraarticular fluid. Bones: No focal marrow signal abnormality. No fracture or dislocation. No aggressive osseous lesion. Other: None. IMPRESSION: 1. Severe tendinosis of the supraspinatus tendon with a small interstitial tear. 2. Moderate tendinosis of the infraspinatus tendon with fraying along the bursal surface. 3. Mild tendinosis of the subscapularis tendon. Electronically Signed   By: Kathreen Devoid   On: 10/25/2015 09:25   Dg Shoulder Left  Result Date: 10/23/2015 CLINICAL DATA:  75 year old female  with left shoulder pain.  No known injury. EXAM: LEFT SHOULDER - 2+ VIEW COMPARISON:  Chest radiograph dated 10/23/2015 FINDINGS: There is no acute fracture or dislocation. The bones are mildly osteopenic. No significant arthritic changes. There is a small well corticated bony fragment superior to the left humeral head and inferior to the acromion likely representing an intra-articular fragment. The soft tissues appear unremarkable. IMPRESSION: No acute fracture or dislocation. Small intra-articular fragment superior to the humeral head. Electronically Signed   By: Anner Crete M.D.   On: 10/23/2015 23:00    Discharge Exam: Vitals:   10/24/15 2212 10/25/15 0506  BP: 134/68 (!) 101/48  Pulse: 71 63  Resp:  18  Temp:  98.1 F (36.7 C)   Vitals:   10/24/15 0610 10/24/15 2008 10/24/15 2212 10/25/15 0506  BP: 125/78 115/62 134/68 (!) 101/48  Pulse: 63 69 71 63  Resp: 18 18  18   Temp: 98.2 F (36.8 C) 98.9 F (37.2 C)  98.1 F (36.7 C)  TempSrc: Oral Oral  Oral  SpO2: 97% 97%  97%  Weight:      Height:        General: Pt is alert, follows commands appropriately, not in acute distress Cardiovascular: Regular rate and rhythm, S1/S2 +, no murmurs, no rubs, no gallops Respiratory: Clear to auscultation bilaterally, no wheezing, no crackles, no rhonchi Abdominal: Soft, non tender, non distended, bowel sounds +, no guarding  Discharge Instructions  Discharge Instructions    Diet - low sodium heart healthy    Complete by:  As directed   Increase activity slowly    Complete by:  As directed       Medication List    TAKE these medications   albuterol 90 MCG/ACT inhaler Commonly known as:  PROVENTIL,VENTOLIN Inhale 2 puffs into the lungs every 4 (four) hours as needed for wheezing or shortness of breath.   amLODipine 10 MG tablet Commonly known as:  NORVASC Take 10 mg by mouth daily.   atorvastatin 10 MG tablet Commonly known as:  LIPITOR Take 10 mg by mouth every morning.    cholecalciferol 1000 units tablet Commonly known as:  VITAMIN D Take 1,000 Units by mouth daily.   esomeprazole 40 MG capsule Commonly known as:  NEXIUM Take 40 mg by mouth daily as needed (acid reflux).   gabapentin 300 MG capsule Commonly known as:  NEURONTIN Take 1 capsule (300 mg total) by mouth at bedtime. What changed:  when to take this  reasons to take this   HYDROcodone-acetaminophen 5-325 MG tablet Commonly known as:  NORCO/VICODIN Take 1-2 tablets by mouth every 4 (four) hours as needed for moderate pain.   lisinopril 40 MG tablet Commonly known as:  PRINIVIL,ZESTRIL Take 40 mg by mouth every morning.   metoprolol tartrate 25 MG tablet Commonly known as:  LOPRESSOR Take 25 mg by mouth 2 (two) times daily.   mirabegron ER 50 MG Tb24 tablet Commonly known as:  MYRBETRIQ Take 50 mg by mouth daily.   nitroGLYCERIN 0.4 MG SL tablet Commonly known as:  NITROSTAT Place 0.4 mg under the tongue every 5 (five) minutes as needed for chest pain.   TRESIBA FLEXTOUCH 200 UNIT/ML Sopn Generic drug:  Insulin Degludec Inject 74 Units into the skin daily before breakfast.   zolpidem 5 MG tablet Commonly known as:  AMBIEN Take 1 tablet (5 mg total) by mouth at bedtime as needed for sleep.      Follow-up [place] .  Why:  rollator (rolling walker with seat) Contact information: [name] [address] [phone]        Maggie Font, MD .   Specialty:  [facility] Contact information: [address] [place] [address] [phone]        [facility] [place], ADAM, MD Follow up on 10/26/2015.   Specialty:  Family Medicine Why:  Please be at the office at 8:45 am Contact information: [address] [place] [address] [phone]        Faye Ramsay, MD .   Specialty:  Internal Medicine Contact information: [address] [place]  [address] [phone]            The results of significant diagnostics from this hospitalization (including imaging, microbiology, ancillary and laboratory) are listed below for reference.     Microbiology: No results found for this or any previous visit (from the past 240 hour(s)).   Labs: Basic Metabolic Panel:  Recent Labs Lab 10/23/15 1618 10/25/15 0528  NA 138 139  K 4.2 4.1  CL 108 109  CO2 25 24  GLUCOSE 175* 118*  BUN 35* 29*  CREATININE 1.99* 2.11*  CALCIUM 9.5 9.1   CBC:  Recent Labs Lab 10/23/15 1618 10/25/15 0528  WBC 4.4 5.2  HGB 12.1 11.8*  HCT 35.2* 35.4*  MCV 88.0 89.2  PLT 216 201   Cardiac Enzymes:  Recent Labs Lab 10/23/15 2253 10/24/15 0415 10/24/15 0940  CKTOTAL 85  --   --   TROPONINI <0.03 <0.03 <0.03   CBG:  Recent Labs Lab 10/24/15 1214 10/24/15 1657 10/24/15 2003 10/25/15 0502 10/25/15 0754  GLUCAP 236* 264* 211* 107* 71   SIGNED: Time coordinating discharge:  30 minutes  MAGICK-Santa Abdelrahman, MD  [facility] 10/25/2015, 10:36 AM Pager [phone]  If 7PM-7AM, please contact night-coverage [email or web address] Password [email or web address]

## 2015-10-26 DIAGNOSIS — M47892 Other spondylosis, cervical region: Secondary | ICD-10-CM | POA: Diagnosis not present

## 2015-10-26 DIAGNOSIS — M542 Cervicalgia: Secondary | ICD-10-CM | POA: Diagnosis not present

## 2015-10-26 DIAGNOSIS — M25512 Pain in left shoulder: Secondary | ICD-10-CM | POA: Diagnosis not present

## 2015-10-31 ENCOUNTER — Other Ambulatory Visit (HOSPITAL_COMMUNITY): Payer: Self-pay | Admitting: Urology

## 2015-10-31 DIAGNOSIS — Z85528 Personal history of other malignant neoplasm of kidney: Secondary | ICD-10-CM

## 2015-11-02 DIAGNOSIS — Z23 Encounter for immunization: Secondary | ICD-10-CM | POA: Diagnosis not present

## 2015-11-08 ENCOUNTER — Ambulatory Visit (HOSPITAL_COMMUNITY)
Admission: RE | Admit: 2015-11-08 | Discharge: 2015-11-08 | Disposition: A | Discharge status: Home / Self Care | Payer: Medicare Other | Source: Ambulatory Visit | Attending: Urology | Admitting: Urology

## 2015-11-08 DIAGNOSIS — R109 Unspecified abdominal pain: Secondary | ICD-10-CM | POA: Diagnosis not present

## 2015-11-08 DIAGNOSIS — Z85528 Personal history of other malignant neoplasm of kidney: Secondary | ICD-10-CM | POA: Diagnosis not present

## 2015-11-08 DIAGNOSIS — Z905 Acquired absence of kidney: Secondary | ICD-10-CM | POA: Insufficient documentation

## 2015-11-14 DIAGNOSIS — C642 Malignant neoplasm of left kidney, except renal pelvis: Secondary | ICD-10-CM | POA: Diagnosis not present

## 2015-11-14 DIAGNOSIS — E669 Obesity, unspecified: Secondary | ICD-10-CM | POA: Diagnosis not present

## 2015-11-14 DIAGNOSIS — D631 Anemia in chronic kidney disease: Secondary | ICD-10-CM | POA: Diagnosis not present

## 2015-11-14 DIAGNOSIS — I10 Essential (primary) hypertension: Secondary | ICD-10-CM | POA: Diagnosis not present

## 2015-11-14 DIAGNOSIS — N2581 Secondary hyperparathyroidism of renal origin: Secondary | ICD-10-CM | POA: Diagnosis not present

## 2015-11-14 DIAGNOSIS — N179 Acute kidney failure, unspecified: Secondary | ICD-10-CM | POA: Diagnosis not present

## 2015-11-14 DIAGNOSIS — C641 Malignant neoplasm of right kidney, except renal pelvis: Secondary | ICD-10-CM | POA: Diagnosis not present

## 2015-11-14 DIAGNOSIS — M4800 Spinal stenosis, site unspecified: Secondary | ICD-10-CM | POA: Diagnosis not present

## 2015-11-14 DIAGNOSIS — N184 Chronic kidney disease, stage 4 (severe): Secondary | ICD-10-CM | POA: Diagnosis not present

## 2015-11-14 DIAGNOSIS — E119 Type 2 diabetes mellitus without complications: Secondary | ICD-10-CM | POA: Diagnosis not present

## 2015-11-21 ENCOUNTER — Ambulatory Visit (INDEPENDENT_AMBULATORY_CARE_PROVIDER_SITE_OTHER): Payer: Medicare Other | Admitting: Ophthalmology

## 2015-11-21 DIAGNOSIS — E113293 Type 2 diabetes mellitus with mild nonproliferative diabetic retinopathy without macular edema, bilateral: Secondary | ICD-10-CM | POA: Diagnosis not present

## 2015-11-21 DIAGNOSIS — I10 Essential (primary) hypertension: Secondary | ICD-10-CM

## 2015-11-21 DIAGNOSIS — H35033 Hypertensive retinopathy, bilateral: Secondary | ICD-10-CM

## 2015-11-21 DIAGNOSIS — H43813 Vitreous degeneration, bilateral: Secondary | ICD-10-CM

## 2015-11-21 DIAGNOSIS — E11319 Type 2 diabetes mellitus with unspecified diabetic retinopathy without macular edema: Secondary | ICD-10-CM

## 2015-12-02 IMAGING — CR DG CHEST 2V
2 series · 2 of 2 positions shown · non-contrast
Comparison: Chest x-ray of June 07, 2012.

CLINICAL DATA: History of hypertension and diabetes and renal
malignancy

EXAM:
CHEST  2 VIEW

[w chest pa]
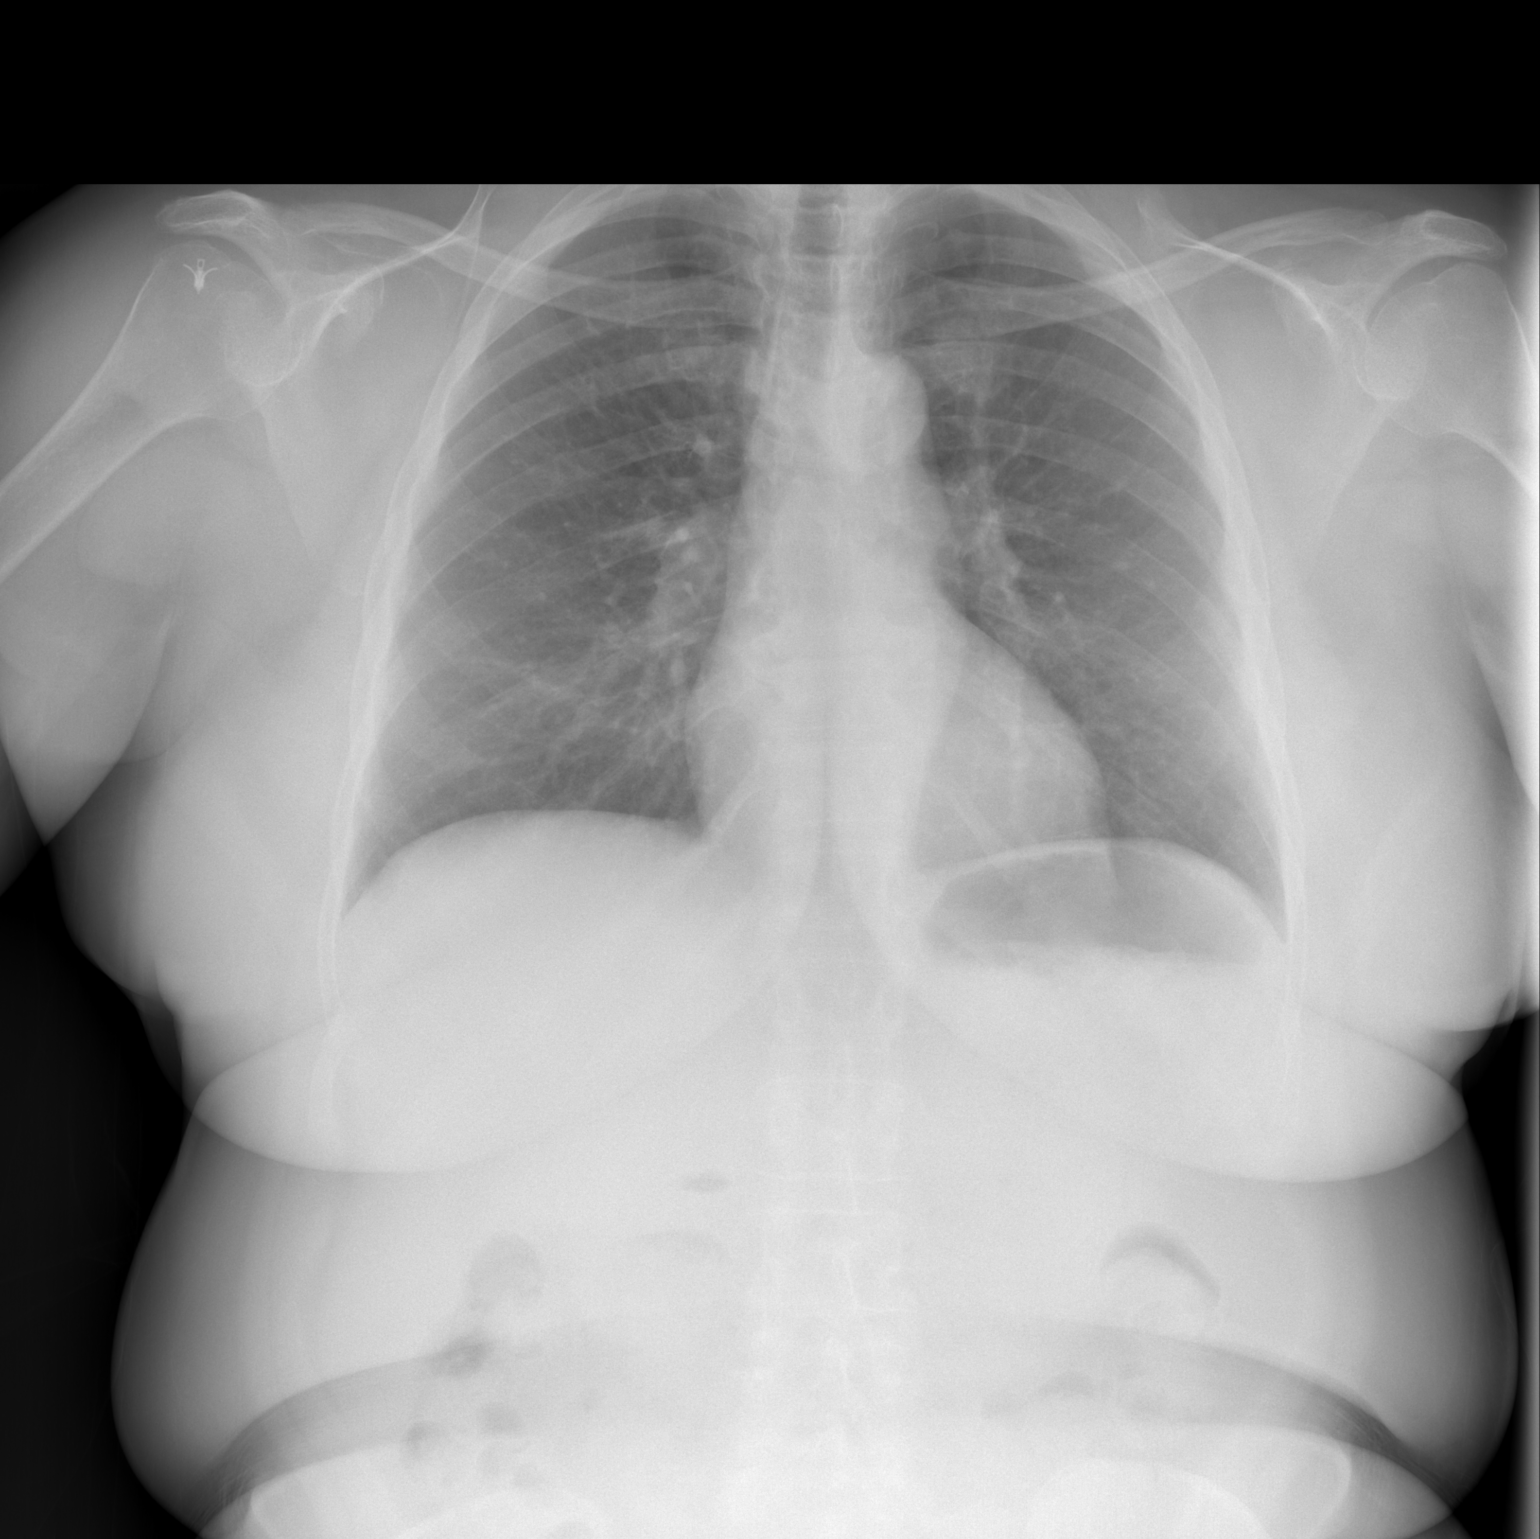

[w chest lat]
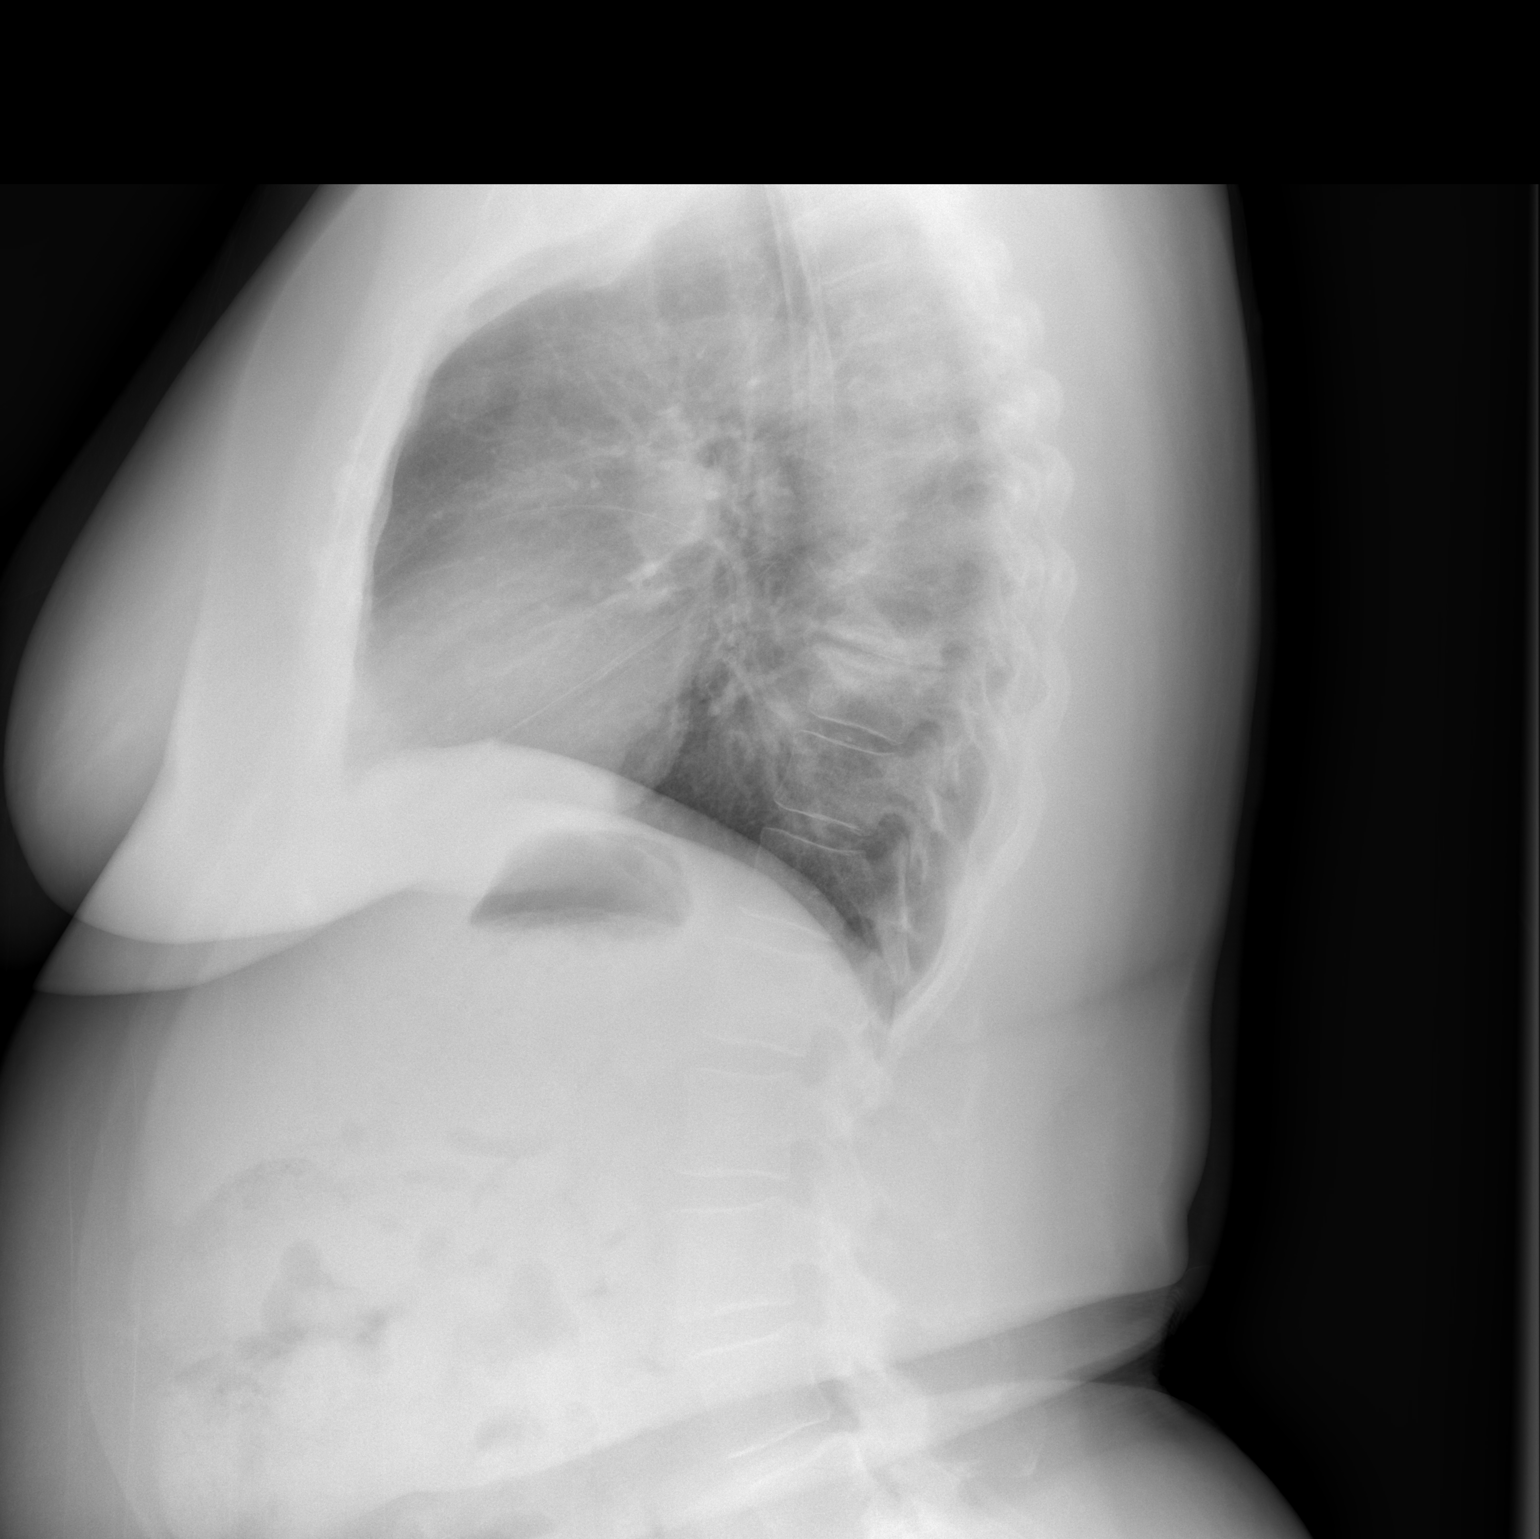

[2 of 2 positions shown; findings below may reference images not displayed]

FINDINGS: The lungs are well-expanded and clear. The cardiac silhouette is
normal in size. The pulmonary vascularity is not engorged. There is
mild tortuosity of the descending thoracic aorta. There is no
pleural effusion or pneumothorax. There are mild degenerative disc
changes of several mid thoracic levels. The patient has undergone
previous lower cervical fusion and right-sided rotator cuff repair.
IMPRESSION: There is no evidence of CHF or pneumonia nor other acute
cardiopulmonary abnormality.

## 2016-01-05 ENCOUNTER — Other Ambulatory Visit: Payer: Self-pay | Admitting: Podiatry

## 2016-01-08 DIAGNOSIS — I10 Essential (primary) hypertension: Secondary | ICD-10-CM | POA: Diagnosis not present

## 2016-01-08 DIAGNOSIS — E119 Type 2 diabetes mellitus without complications: Secondary | ICD-10-CM | POA: Diagnosis not present

## 2016-01-08 DIAGNOSIS — Z6832 Body mass index (BMI) 32.0-32.9, adult: Secondary | ICD-10-CM | POA: Diagnosis not present

## 2016-01-09 DIAGNOSIS — N183 Chronic kidney disease, stage 3 (moderate): Secondary | ICD-10-CM | POA: Diagnosis not present

## 2016-01-09 DIAGNOSIS — Z6832 Body mass index (BMI) 32.0-32.9, adult: Secondary | ICD-10-CM | POA: Diagnosis not present

## 2016-01-09 DIAGNOSIS — E1122 Type 2 diabetes mellitus with diabetic chronic kidney disease: Secondary | ICD-10-CM | POA: Diagnosis not present

## 2016-01-09 DIAGNOSIS — I10 Essential (primary) hypertension: Secondary | ICD-10-CM | POA: Diagnosis not present

## 2016-01-31 DIAGNOSIS — N179 Acute kidney failure, unspecified: Secondary | ICD-10-CM | POA: Diagnosis not present

## 2016-01-31 DIAGNOSIS — D631 Anemia in chronic kidney disease: Secondary | ICD-10-CM | POA: Diagnosis not present

## 2016-01-31 DIAGNOSIS — C642 Malignant neoplasm of left kidney, except renal pelvis: Secondary | ICD-10-CM | POA: Diagnosis not present

## 2016-01-31 DIAGNOSIS — E119 Type 2 diabetes mellitus without complications: Secondary | ICD-10-CM | POA: Diagnosis not present

## 2016-01-31 DIAGNOSIS — N184 Chronic kidney disease, stage 4 (severe): Secondary | ICD-10-CM | POA: Diagnosis not present

## 2016-01-31 DIAGNOSIS — M4800 Spinal stenosis, site unspecified: Secondary | ICD-10-CM | POA: Diagnosis not present

## 2016-01-31 DIAGNOSIS — I10 Essential (primary) hypertension: Secondary | ICD-10-CM | POA: Diagnosis not present

## 2016-01-31 DIAGNOSIS — N2581 Secondary hyperparathyroidism of renal origin: Secondary | ICD-10-CM | POA: Diagnosis not present

## 2016-01-31 DIAGNOSIS — C641 Malignant neoplasm of right kidney, except renal pelvis: Secondary | ICD-10-CM | POA: Diagnosis not present

## 2016-01-31 DIAGNOSIS — E669 Obesity, unspecified: Secondary | ICD-10-CM | POA: Diagnosis not present

## 2016-02-01 ENCOUNTER — Emergency Department (HOSPITAL_COMMUNITY)
Admission: EM | Admit: 2016-02-01 | Discharge: 2016-02-01 | Disposition: A | Discharge status: Home / Self Care | Payer: Medicare Other | Attending: Emergency Medicine | Admitting: Emergency Medicine

## 2016-02-01 ENCOUNTER — Emergency Department (HOSPITAL_COMMUNITY): Payer: Medicare Other

## 2016-02-01 ENCOUNTER — Encounter (HOSPITAL_COMMUNITY): Payer: Self-pay

## 2016-02-01 DIAGNOSIS — J45909 Unspecified asthma, uncomplicated: Secondary | ICD-10-CM | POA: Diagnosis not present

## 2016-02-01 DIAGNOSIS — R079 Chest pain, unspecified: Secondary | ICD-10-CM | POA: Diagnosis not present

## 2016-02-01 DIAGNOSIS — Z7982 Long term (current) use of aspirin: Secondary | ICD-10-CM | POA: Diagnosis not present

## 2016-02-01 DIAGNOSIS — R0602 Shortness of breath: Secondary | ICD-10-CM | POA: Diagnosis not present

## 2016-02-01 DIAGNOSIS — R042 Hemoptysis: Secondary | ICD-10-CM

## 2016-02-01 DIAGNOSIS — Z85528 Personal history of other malignant neoplasm of kidney: Secondary | ICD-10-CM | POA: Insufficient documentation

## 2016-02-01 DIAGNOSIS — I10 Essential (primary) hypertension: Secondary | ICD-10-CM | POA: Diagnosis not present

## 2016-02-01 DIAGNOSIS — R05 Cough: Secondary | ICD-10-CM | POA: Diagnosis not present

## 2016-02-01 DIAGNOSIS — E109 Type 1 diabetes mellitus without complications: Secondary | ICD-10-CM | POA: Diagnosis not present

## 2016-02-01 LAB — I-STAT TROPONIN, ED
Troponin i, poc: 0 ng/mL (ref 0.00–0.08)
Troponin i, poc: 0 ng/mL (ref 0.00–0.08)

## 2016-02-01 LAB — CBC
HCT: 36.1 % (ref 36.0–46.0)
Hemoglobin: 12.5 g/dL (ref 12.0–15.0)
MCH: 30.2 pg (ref 26.0–34.0)
MCHC: 34.6 g/dL (ref 30.0–36.0)
MCV: 87.2 fL (ref 78.0–100.0)
Platelets: 219 10*3/uL (ref 150–400)
RBC: 4.14 MIL/uL (ref 3.87–5.11)
RDW: 13.7 % (ref 11.5–15.5)
WBC: 4.4 10*3/uL (ref 4.0–10.5)

## 2016-02-01 LAB — BASIC METABOLIC PANEL
Anion gap: 9 (ref 5–15)
BUN: 21 mg/dL — ABNORMAL HIGH (ref 6–20)
CO2: 24 mmol/L (ref 22–32)
Calcium: 9.5 mg/dL (ref 8.9–10.3)
Chloride: 107 mmol/L (ref 101–111)
Creatinine, Ser: 1.88 mg/dL — ABNORMAL HIGH (ref 0.44–1.00)
GFR calc Af Amer: 29 mL/min — ABNORMAL LOW (ref >60)
GFR calc non Af Amer: 25 mL/min — ABNORMAL LOW (ref >60)
Glucose, Bld: 190 mg/dL — ABNORMAL HIGH (ref 65–99)
Potassium: 4.4 mmol/L (ref 3.5–5.1)
Sodium: 140 mmol/L (ref 135–145)

## 2016-02-01 MED ORDER — BENZONATATE 100 MG PO CAPS: 100.0000 mg | ORAL_CAPSULE | Freq: Three times a day (TID) | ORAL | 0 refills | Status: DC

## 2016-02-01 NOTE — ED Triage Notes (Signed)
Patient states she has been coughing up bright red blood when she coughs x 1 week and has chest pain when she coughs x 5 days. SOB x 4 days.

## 2016-02-01 NOTE — Discharge Instructions (Signed)
Read the information below.  Your chest x-ray did not show any signs of pneumonia. Your scan did not show evidence of a blood clot.  This may be some irritation of your airways. Drink warm liquids. Use your inhaler for shortness of breath and wheezing. Continue the medication given by your doctor. I have prescribed tessalon perles for cough relief.  Please call and follow up with your regular doctor within one week for re-evaluation.  Use the prescribed medication as directed.  Please discuss all new medications with your pharmacist.   You may return to the Emergency Department at any time for worsening condition or any new symptoms that concern you. Return if you develop fever, persistent chest pain, increasing shortness of breath, increasing blood in phlegm, dizziness, lightheadedness, or any other new/concerning symptoms.

## 2016-02-01 NOTE — ED Notes (Signed)
Pt transported to [facility] for scan.

## 2016-02-01 NOTE — ED Provider Notes (Signed)
[facility] DEPT Provider Note   CSN: [id number] Arrival date & time: 02/01/16  1529     History   Chief Complaint Chief Complaint  Patient presents with  . Hemoptysis    HPI [name] is a 75 y.o. female.  [name] is a 75 y.o. female with h/o diabetes, asthma, arthritis, HTN, HLD, RCC s/p R nephrectomy 1/17 presents to ED with complaint of hemoptysis onset one week ago. Patient reports associated sharp left sided chest pain with cough x 5 days and dyspnea on exertion x 4 days. Endorses wheezing. No URI sxs preceding hemoptysis. Denies fever, palpitations, visual disturbances, abdominal complaints, urinary complaints, dizziness, lightheadedness, syncope. She does report she travelled by train to [place] around Thanksgiving and had left lower extremity swelling around the same time that has since resolved. H/o RCC s/p right nephrectomy in Jan 2017, no chemotherapy or radiation. No hormone therapy. No h/o blood clots. No anti-coagulation therapy.       Past Medical History:  Diagnosis Date  . Anginal pain ([place])    just occurs without any specific trigger; pt states has never had to use nitroglycerin tabs; uses rest to relieve  . Arthritis   . Asthma   . Diabetes mellitus    Type 1  . Dysrhythmia    "irregular heart beat"  . GERD (gastroesophageal reflux disease)   . History of urinary tract infection   . Hypercholesteremia   . Hypertension   . left renal ca dx'd 2012 (?)   surg only, left kidney  . Nocturia   . Reflux   . Renal failure (ARF), acute on chronic (HCC)   . Shortness of breath dyspnea    pt denies; states can climb stairs w/o difficulty   . Sleep apnea    does not use c-pap machine  . Tingling in extremities    legs bilat  . Tinnitus   . Urinary frequency   . Urinary incontinence   . Vertigo    occurs when lying flat     Patient Active Problem List   Diagnosis Date Noted  . Left shoulder pain   . Limited joint range of motion (ROM)    . Chest pain 10/23/2015  . Diabetes mellitus type 2 in obese ([place]) 10/23/2015  . Renal neoplasm 03/20/2015  . Right renal mass   . Chest pain with high risk for cardiac etiology 07/28/2014  . ACS (acute coronary syndrome) ([place]) 12/27/2010  . Diabetes mellitus 12/27/2010  . Hypertension 12/27/2010  . Hyperlipidemia 12/27/2010  . GERD (gastroesophageal reflux disease) 12/27/2010  . Renal cell adenocarcinoma ([place]) 12/27/2010  . Arthritis 12/27/2010  . Cervical stenosis of spine 12/27/2010  . Carpal tunnel syndrome 12/27/2010  . Asthma 12/27/2010    Past Surgical History:  Procedure Laterality Date  . ABDOMINAL HYSTERECTOMY    . APPENDECTOMY    . CARDIAC CATHETERIZATION N/A 07/28/2014   Procedure: Left Heart Cath and Coronary Angiography;  Surgeon: [name], MD;  Location: [facility];  Service: Cardiovascular;  Laterality: N/A;  . CYSTOSCOPY WITH RETROGRADE PYELOGRAM, URETEROSCOPY AND STENT PLACEMENT Right 02/09/2015   Procedure: CYSTOSCOPY WITH RETROGRADE PYELOGRAM AND URETEROSCOPY ;  Surgeon: Raynelle Bring, MD;  Location: [facility];  Service: Urology;  Laterality: Right;  . LAPAROSCOPIC NEPHRECTOMY Right 03/20/2015   Procedure: LAPAROSCOPIC NEPHRECTOMY;  Surgeon: Raynelle Bring, MD;  Location: [facility];  Service: Urology;  Laterality: Right;  . PERIPHERAL VASCULAR CATHETERIZATION N/A 07/28/2014   Procedure: Aortic Arch Angiography;  Surgeon:  [name], MD;  Location: [facility];  Service: Cardiovascular;  Laterality: N/A;  . RENAL MASS EXCISION    . SPINE SURGERY      OB History    No data available       Home Medications    Prior to Admission medications   Medication Sig Start Date End Date Taking? Authorizing Provider  albuterol (PROVENTIL,VENTOLIN) 90 MCG/ACT inhaler Inhale 2 puffs into the lungs every 4 (four) hours as needed for wheezing or shortness of breath.    Yes Historical Provider, MD  amLODipine (NORVASC) 10 MG tablet Take 10 mg by mouth daily.   Yes  Historical Provider, MD  aspirin EC 81 MG tablet Take 81 mg by mouth at bedtime.   Yes Historical Provider, MD  atorvastatin (LIPITOR) 10 MG tablet Take 10 mg by mouth every morning.    Yes Historical Provider, MD  cholecalciferol (VITAMIN D) 1000 units tablet Take 1,000 Units by mouth daily.   Yes Historical Provider, MD  gabapentin (NEURONTIN) 300 MG capsule TAKE 1 CAPSULE (300 MG TOTAL) BY MOUTH AT BEDTIME. 01/05/16  Yes Wallene Huh, DPM  Insulin Degludec (TRESIBA FLEXTOUCH) 200 UNIT/ML SOPN Inject 74 Units into the skin daily before breakfast.    Yes Historical Provider, MD  metoprolol tartrate (LOPRESSOR) 25 MG tablet Take 37.5 mg by mouth 2 (two) times daily.    Yes Historical Provider, MD  mirabegron ER (MYRBETRIQ) 50 MG TB24 tablet Take 50 mg by mouth daily.   Yes Historical Provider, MD  Multiple Vitamins-Minerals (CENTRUM SILVER ADULT 50+ PO) Take 1 tablet by mouth daily.   Yes Historical Provider, MD  azithromycin (ZITHROMAX) 250 MG tablet Take 250-500 mg by mouth as directed. Take 500mg  on day 1 then 250mg  for 5 days 01/26/16   Historical Provider, MD  benzonatate (TESSALON) 100 MG capsule Take 1 capsule (100 mg total) by mouth every 8 (eight) hours. 02/01/16   Roxanna Mew, PA-C  HYDROcodone-acetaminophen (NORCO/VICODIN) 5-325 MG tablet Take 1-2 tablets by mouth every 4 (four) hours as needed for moderate pain. Patient not taking: Reported on 02/01/2016 10/25/15   Theodis Blaze, MD  nitroGLYCERIN (NITROSTAT) 0.4 MG SL tablet Place 0.4 mg under the tongue every 5 (five) minutes as needed for chest pain.    Historical Provider, MD  zolpidem (AMBIEN) 5 MG tablet Take 1 tablet (5 mg total) by mouth at bedtime as needed for sleep. Patient not taking: Reported on 02/01/2016 10/25/15   Theodis Blaze, MD    Family History Family History  Problem Relation Age of Onset  . Hypertension Mother   . Coronary artery disease      Social History Social History  Substance Use Topics  .  Smoking status: Never Smoker  . Smokeless tobacco: Never Used  . Alcohol use No     Allergies   Sulfa antibiotics   Review of Systems Review of Systems  Constitutional: Negative for fever.  HENT: Negative for congestion, rhinorrhea and trouble swallowing.   Eyes: Negative for visual disturbance.  Respiratory: Positive for cough ( hemoptysis) and shortness of breath ( intermittent).   Cardiovascular: Positive for chest pain ( intemittent, only with coughing). Negative for palpitations.  Gastrointestinal: Negative for abdominal pain, nausea and vomiting.  Genitourinary: Negative for dysuria and hematuria.  Musculoskeletal: Positive for arthralgias ( chronic).  Skin: Negative for rash.  Neurological: Negative for dizziness, syncope and light-headedness.     Physical Exam Updated Vital Signs BP 166/80 (BP Location:  Left Arm)   Pulse 87   Temp 98.3 F (36.8 C) (Oral)   Resp 18   Ht 4\' 11"  (1.499 m)   Wt 71.2 kg   SpO2 100%   BMI 31.71 kg/m   Physical Exam  Constitutional: She appears well-developed and well-nourished. No distress.  HENT:  Head: Normocephalic and atraumatic.  Mouth/Throat: Oropharynx is clear and moist. No oropharyngeal exudate.  Eyes: Conjunctivae and EOM are normal. Pupils are equal, round, and reactive to light. Right eye exhibits no discharge. Left eye exhibits no discharge. No scleral icterus.  Neck: Normal range of motion and phonation normal. Neck supple. No neck rigidity. Normal range of motion present.  Cardiovascular: Normal rate, regular rhythm, normal heart sounds and intact distal pulses.   No murmur heard. Pulmonary/Chest: Effort normal and breath sounds normal. No stridor. No respiratory distress. She has no wheezes. She has no rales.  Patient had bloody sputum noted on napkin in room.   Abdominal: Soft. Bowel sounds are normal. She exhibits no distension. There is no tenderness.  Musculoskeletal: Normal range of motion.  No lower extremity  swelling. No posterior calf tenderness.   Lymphadenopathy:    She has no cervical adenopathy.  Neurological: She is alert. She is not disoriented. Coordination and gait normal. GCS eye subscore is 4. GCS verbal subscore is 5. GCS motor subscore is 6.  Skin: Skin is warm and dry. She is not diaphoretic.  Psychiatric: She has a normal mood and affect. Her behavior is normal.     ED Treatments / Results  Labs (all labs ordered are listed, but only abnormal results are displayed) Labs Reviewed  BASIC METABOLIC PANEL - Abnormal; Notable for the following:       Result Value   Glucose, Bld 190 (*)    BUN 21 (*)    Creatinine, Ser 1.88 (*)    GFR calc non Af Amer 25 (*)    GFR calc Af Amer 29 (*)    All other components within normal limits  CBC  I-STAT TROPOININ, ED  I-STAT TROPOININ, ED    EKG  EKG Interpretation  Date/Time:  Thursday February 01 2016 16:02:21 EST Ventricular Rate:  76 PR Interval:    QRS Duration: 80 QT Interval:  380 QTC Calculation: 428 R Axis:   69 Text Interpretation:  Sinus rhythm Low voltage, precordial leads ST-t wave abnormality , no st depressions Abnormal ekg Confirmed by Carmin Muskrat  MD [phone]) on 02/01/2016 4:11:43 PM       Radiology Dg Chest 2 View  Result Date: 02/01/2016 CLINICAL DATA:  Productive cough, chest pain EXAM: CHEST  2 VIEW COMPARISON:  None. FINDINGS: Heart and mediastinal contours are within normal limits. No focal opacities or effusions. No acute bony abnormality. IMPRESSION: No active cardiopulmonary disease. Electronically Signed   By: Rolm Baptise M.D.   On: 02/01/2016 16:40   Nm Pulmonary Perf And Vent  Result Date: 02/01/2016 CLINICAL DATA:  Initial evaluation for shortness of breath for 4 days, productive cough with hemoptysis. History of asthma. History of renal cancer, status post right nephrectomy. EXAM: NUCLEAR MEDICINE VENTILATION - PERFUSION LUNG SCAN TECHNIQUE: Ventilation images were obtained in multiple  projections using inhaled aerosol Tc-17m DTPA. Perfusion images were obtained in multiple projections after intravenous injection of Tc-81m MAA. RADIOPHARMACEUTICALS:  30.2 mCi Technetium-14m DTPA aerosol inhalation and 4.4 mCi Technetium-64m MAA IV COMPARISON:  Prior radiograph from earlier same day. FINDINGS: Ventilation: No focal ventilation defect. Perfusion: No wedge shaped peripheral perfusion  defects to suggest acute pulmonary embolism. IMPRESSION: Negative VQ scan. No ventilation-perfusion mismatch to suggest acute pulmonary embolism identified. Electronically Signed   By: Jeannine Boga M.D.   On: 02/01/2016 20:45    Procedures Procedures (including critical care time)  Medications Ordered in ED Medications - No data to display   Initial Impression / Assessment and Plan / ED Course  I have reviewed the triage vital signs and the nursing notes.  Pertinent labs & imaging results that were available during my care of the patient were reviewed by me and considered in my medical decision making (see chart for details).  Clinical Course as of Feb 01 301  Thu Feb 01, 2016  1653 DG Chest 2 View [AM]  2100 NM Pulmonary Perf and Vent [AM]    Clinical Course User Index [AM] Roxanna Mew, PA-C    Patient presents to ED with complaint of hemoptysis x 1 week, intermittent sharp chest pain with cough x 5 days, and dyspnea on exertion x 4 days. Patient is afebrile and non-toxic appearing in NAD. VSS. Heart RRR. Lungs CTABL. Abdomen soft, non-tender. No lower extremity swelling appreciated. No posterior calf tenderness. EKG shows sinus rhythm ST/T wave abnormalities, low voltage. Delta troponin negative. CXR negative for acute infiltrate, pleural effusion, or PTX. CBC re-assuring. BMP shows stable creatinine. Hyperglycemia present without AG. Given hemoptysis, h/o cancer, recent travel - concern for ?PE. Given h/o nephrectomy and creatinine will order V/Q scan.   V/Q scan negative for  PE. Suspect hemoptysis may be ?secondary to bronchial irritation; patient does endorse prior to seeing blood in sputum she was experiencing a dry cough. Intermittent chest discomfort and SOB likely secondary to cough - ?bronchitis. Discussed results and plan with pt. Re-evaluation of lungs reveal no wheezing, rales, or rhonchi. Pt has inhaler at home. Encouraged use if wheezing or SOB. Symptomatic management for cough discussed. Rx tessalon perles. Follow up with PCP. Return precautions given. Pt voiced understanding and is agreeable.   Pt d/w attending Dr. Wilson Singer.  Final Clinical Impressions(s) / ED Diagnoses   Final diagnoses:  Hemoptysis    New Prescriptions Discharge Medication List as of 02/01/2016  9:49 PM    START taking these medications   Details  benzonatate (TESSALON) 100 MG capsule Take 1 capsule (100 mg total) by mouth every 8 (eight) hours., Starting Thu 02/01/2016, [place], PA-C 02/02/16 9244    Virgel Manifold, MD 02/06/16 1136

## 2016-02-02 ENCOUNTER — Other Ambulatory Visit: Payer: Self-pay | Admitting: Family Medicine

## 2016-02-02 DIAGNOSIS — Z1231 Encounter for screening mammogram for malignant neoplasm of breast: Secondary | ICD-10-CM

## 2016-02-06 DIAGNOSIS — J209 Acute bronchitis, unspecified: Secondary | ICD-10-CM | POA: Diagnosis not present

## 2016-02-13 IMAGING — CR DG CHEST 2V
2 series · 2 of 2 positions shown · non-contrast
Comparison: DG CHEST 2 VIEW dated 01/20/2013;

CLINICAL DATA: Dyspnea with exertion, cough, chills for 2 days,
right-sided rib pain

EXAM:
CHEST  2 VIEW

[w chest pa]
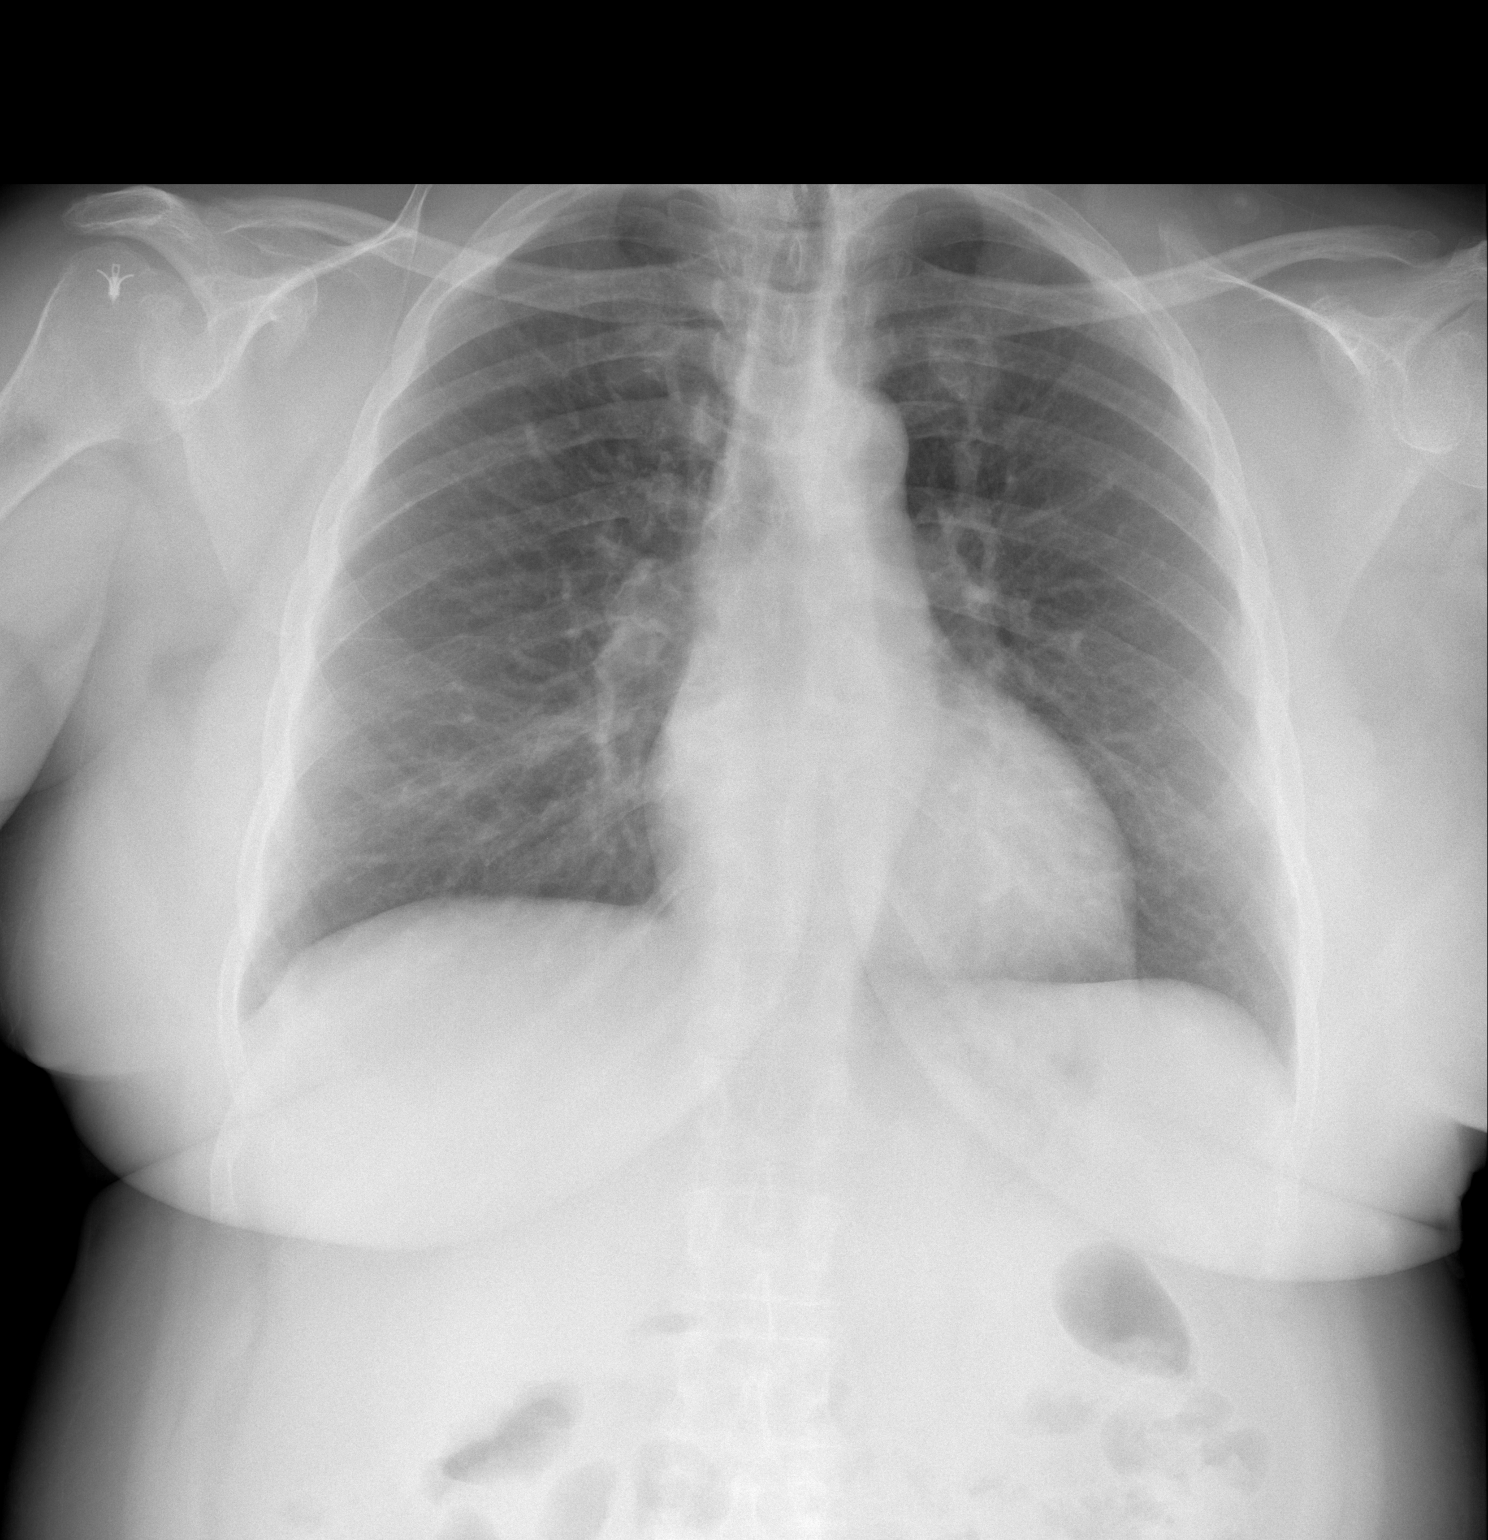

[w chest lat]
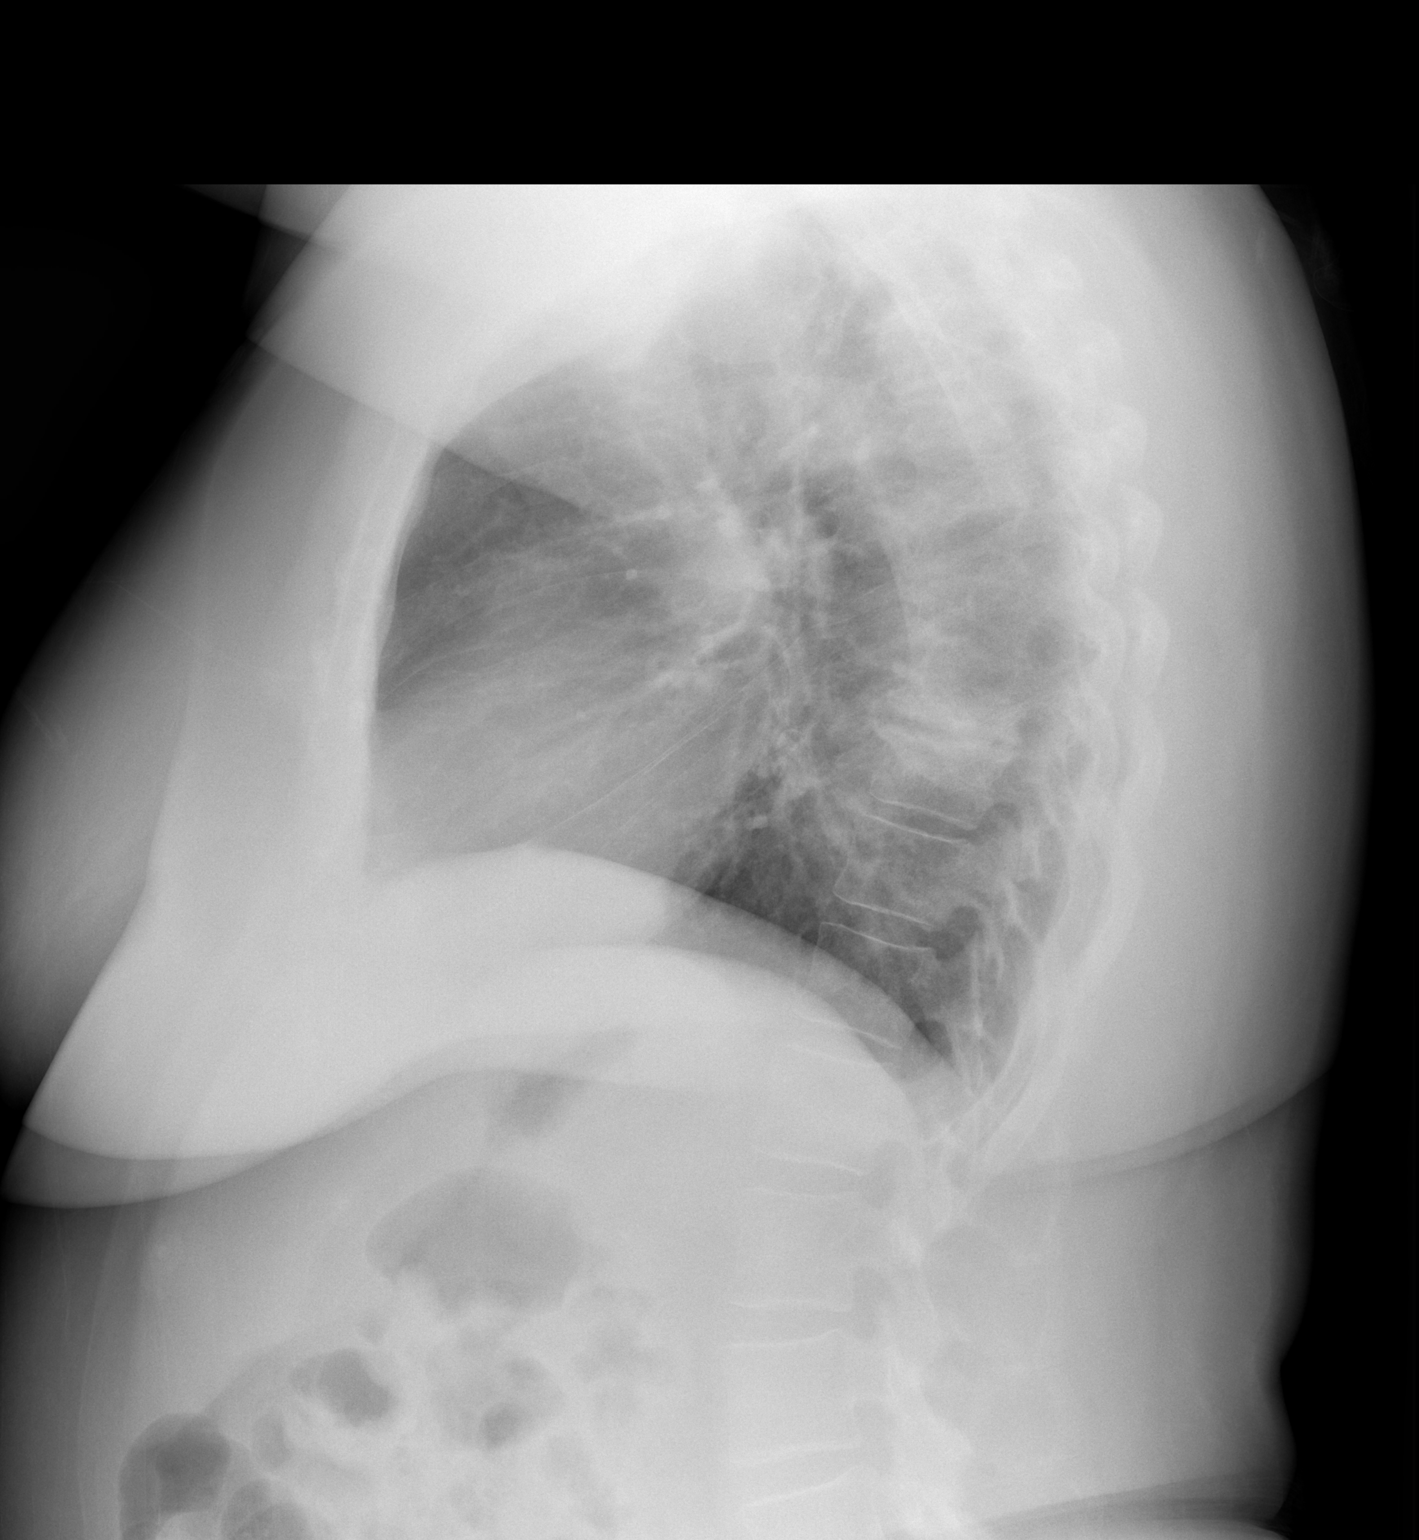

[2 of 2 positions shown; findings below may reference images not displayed]

DG CHEST 2 VIEW dated
01/13/2012; DG CHEST 2 VIEW dated 02/25/2009; DG CHEST 2 VIEW dated
10/13/2008; CT ANGIO CHEST W/CM &/OR WO/CM dated 10/13/2008
FINDINGS: Grossly unchanged cardiac silhouette and mediastinal contours with
mild tortuosity of the thoracic aorta. Evaluation of the
retrosternal clear space is obscured secondary to overlying soft
tissues. No focal airspace opacities. No pleural effusion or
pneumothorax. No evidence of edema. Grossly unchanged bones
including lower cervical ACDF and paraspinal fusion, incompletely
evaluated. Post right-sided rotator cuff repair. DDD within the mid
thoracic spine.
IMPRESSION: No acute cardiopulmonary disease.

## 2016-03-06 ENCOUNTER — Ambulatory Visit: Payer: Medicare Other

## 2016-03-15 ENCOUNTER — Ambulatory Visit
Admission: RE | Admit: 2016-03-15 | Discharge: 2016-03-15 | Disposition: A | Discharge status: Home / Self Care | Payer: Medicare Other | Source: Ambulatory Visit | Attending: Family Medicine | Admitting: Family Medicine

## 2016-03-15 DIAGNOSIS — Z1231 Encounter for screening mammogram for malignant neoplasm of breast: Secondary | ICD-10-CM

## 2016-04-08 DIAGNOSIS — R079 Chest pain, unspecified: Secondary | ICD-10-CM | POA: Diagnosis not present

## 2016-04-08 DIAGNOSIS — N184 Chronic kidney disease, stage 4 (severe): Secondary | ICD-10-CM | POA: Diagnosis not present

## 2016-04-08 DIAGNOSIS — E1122 Type 2 diabetes mellitus with diabetic chronic kidney disease: Secondary | ICD-10-CM | POA: Diagnosis not present

## 2016-04-17 ENCOUNTER — Other Ambulatory Visit: Payer: Self-pay | Admitting: Podiatry

## 2016-05-03 ENCOUNTER — Ambulatory Visit (HOSPITAL_COMMUNITY)
Admission: RE | Admit: 2016-05-03 | Discharge: 2016-05-03 | Disposition: A | Discharge status: Home / Self Care | Payer: Medicare Other | Source: Ambulatory Visit | Attending: Urology | Admitting: Urology

## 2016-05-03 ENCOUNTER — Other Ambulatory Visit (HOSPITAL_COMMUNITY): Payer: Self-pay | Admitting: Urology

## 2016-05-03 DIAGNOSIS — C642 Malignant neoplasm of left kidney, except renal pelvis: Secondary | ICD-10-CM | POA: Diagnosis present

## 2016-05-03 DIAGNOSIS — C641 Malignant neoplasm of right kidney, except renal pelvis: Secondary | ICD-10-CM | POA: Insufficient documentation

## 2016-05-03 DIAGNOSIS — R079 Chest pain, unspecified: Secondary | ICD-10-CM | POA: Diagnosis not present

## 2016-05-17 IMAGING — CT CT ABD-PELV W/ CM
1 of 3 series · 13 of 32 positions shown, 18 images · IV contrast (omnipaque)
Comparison: CT scan of the abdomen pelvis dated 14 January, 2012.

CLINICAL DATA: Nausea with lower abdominal pain for 4 days

EXAM:
CT ABDOMEN AND PELVIS WITH CONTRAST
TECHNIQUE: Multidetector CT imaging of the abdomen and pelvis was performed
using the standard protocol following bolus administration of
intravenous contrast.
CONTRAST:  100mL OMNIPAQUE IOHEXOL 300 MG/ML SOLN intravenously ;
the patient also received oral contrast material

[Series 2: abd/pel with · axial · 0.64mm/px · z∈[-388,-48]mm · 13 of 77 slices shown, 18 images]
[im 5/77  soft-tissue]
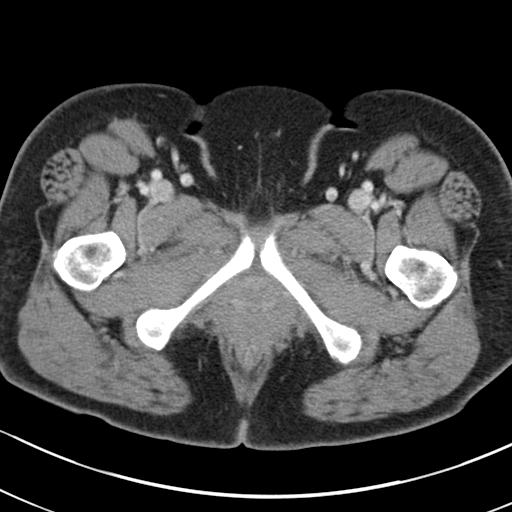
[im 5/77  bone]
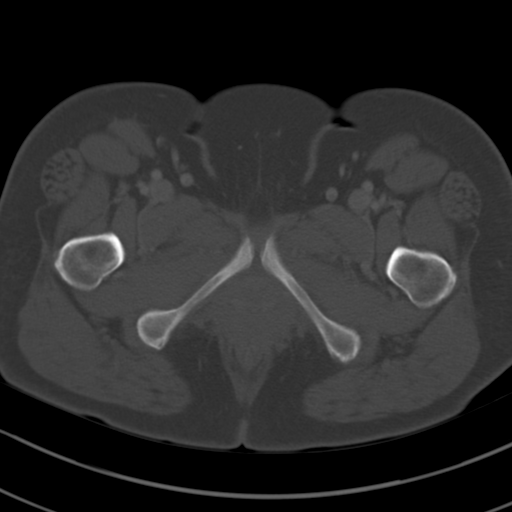
[im 13/77  soft-tissue]
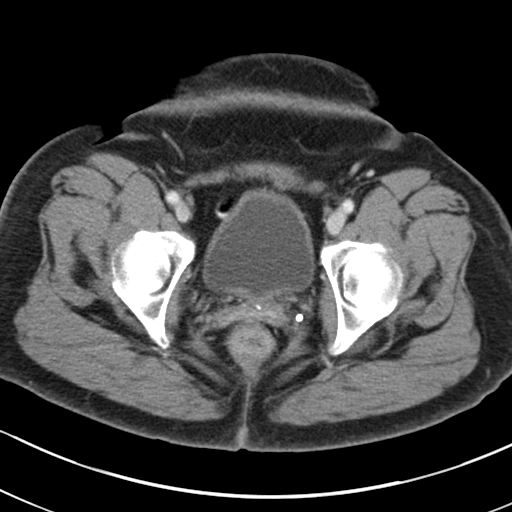
[im 17/77  soft-tissue]
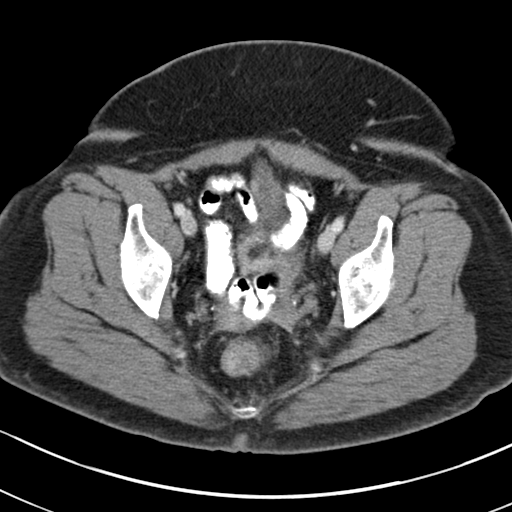
[im 25/77  soft-tissue]
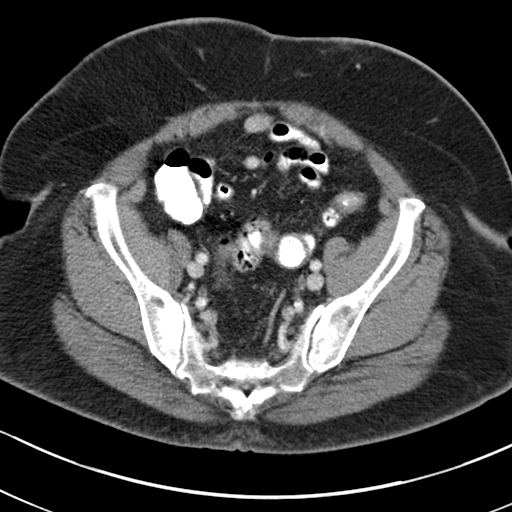
[im 29/77  soft-tissue]
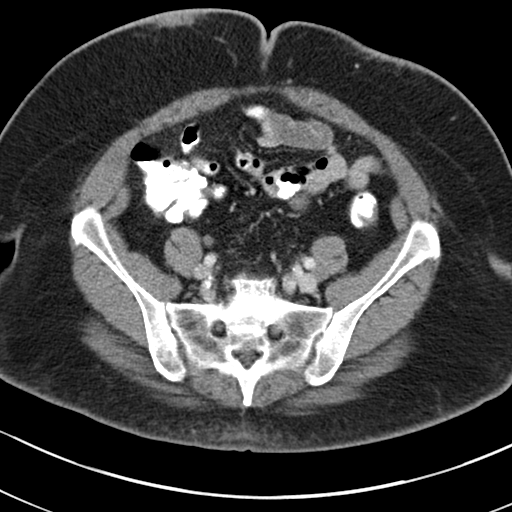
[im 37/77  soft-tissue]
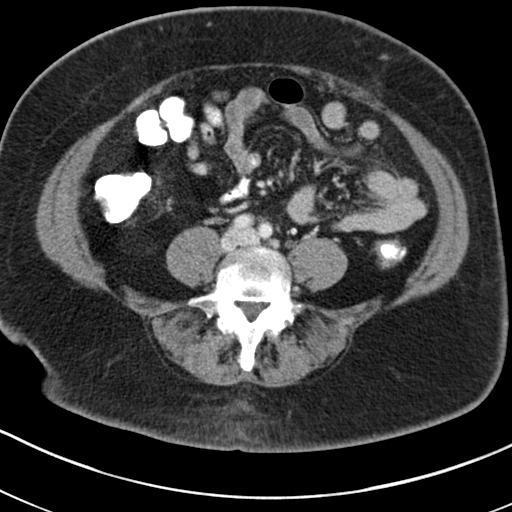
[im 41/77  soft-tissue]
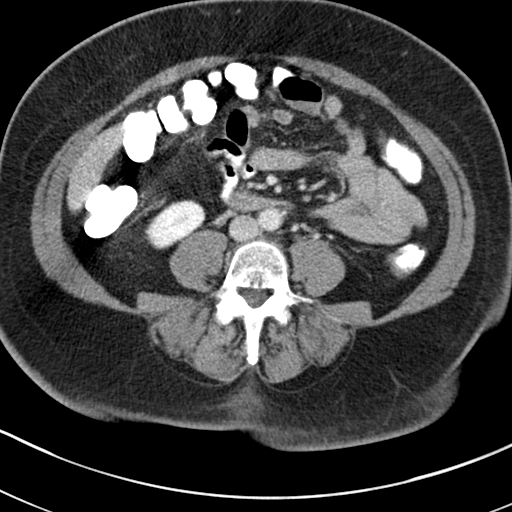
[im 49/77  soft-tissue]
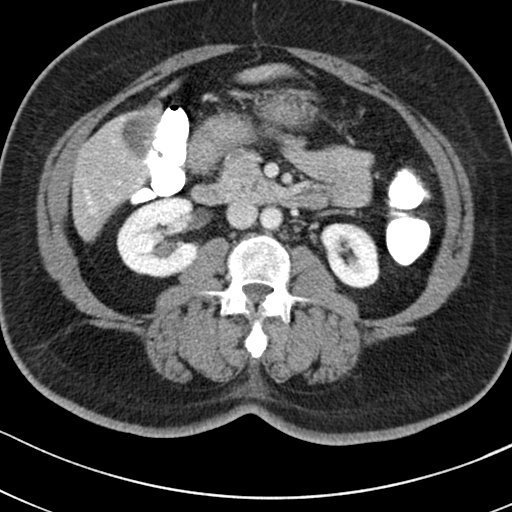
[im 53/77  soft-tissue]
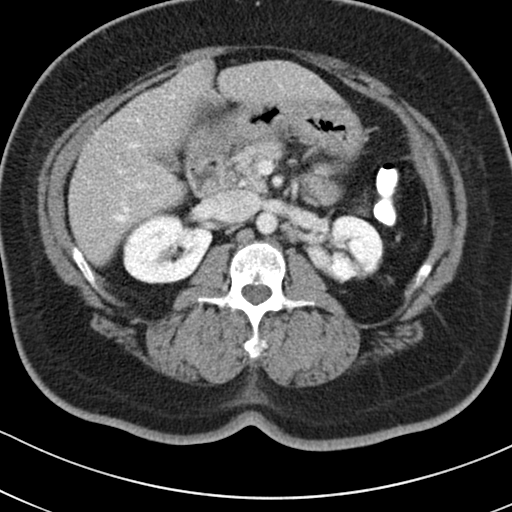
[im 53/77  bone]
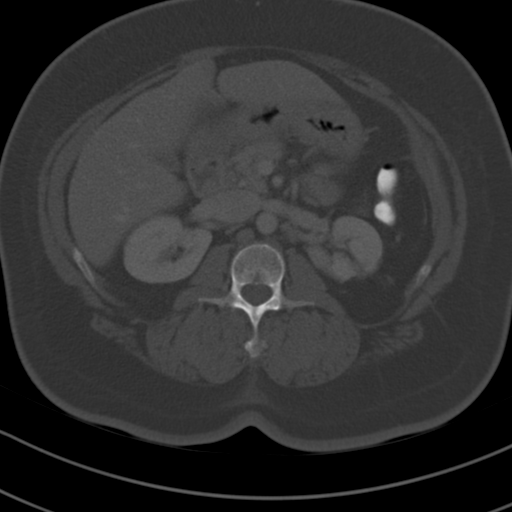
[im 61/77  soft-tissue]
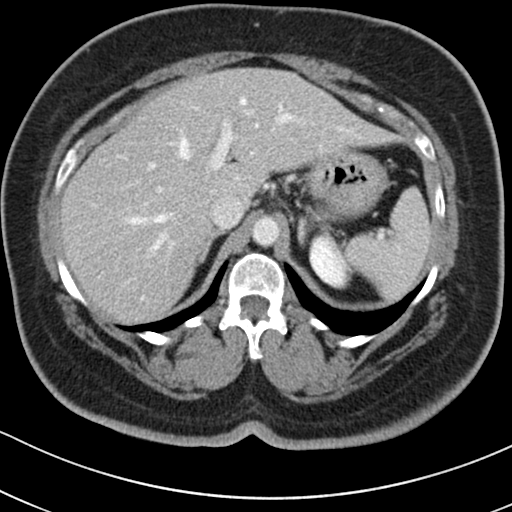
[im 61/77  lung]
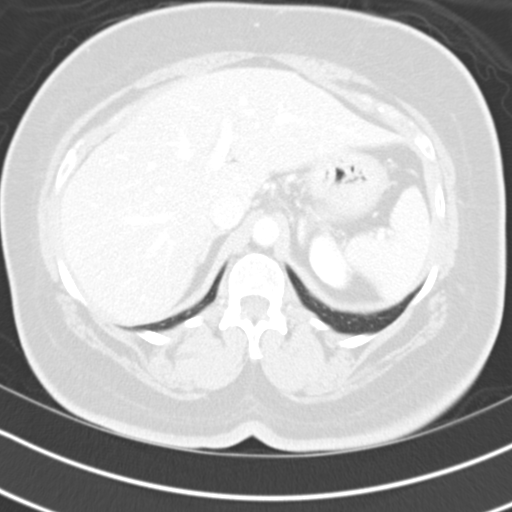
[im 65/77  soft-tissue]
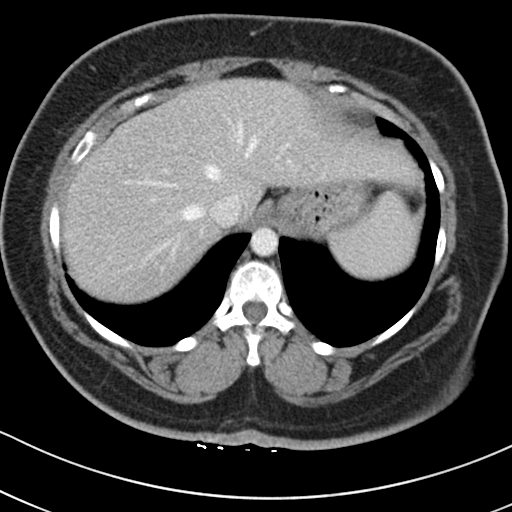
[im 65/77  lung]
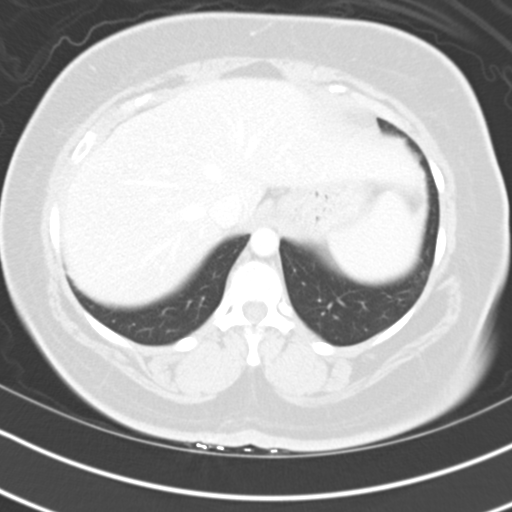
[im 69/77  lung]
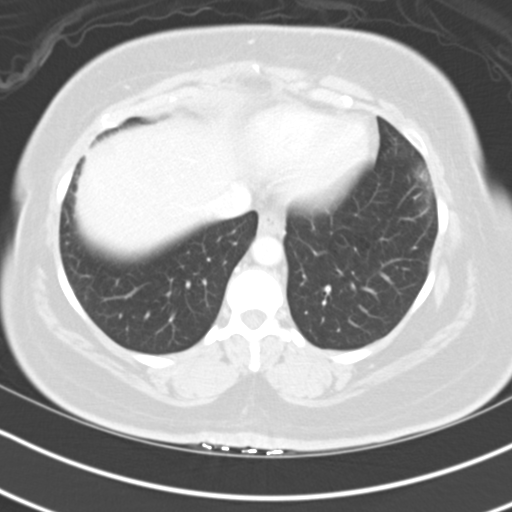
[im 73/77  soft-tissue]
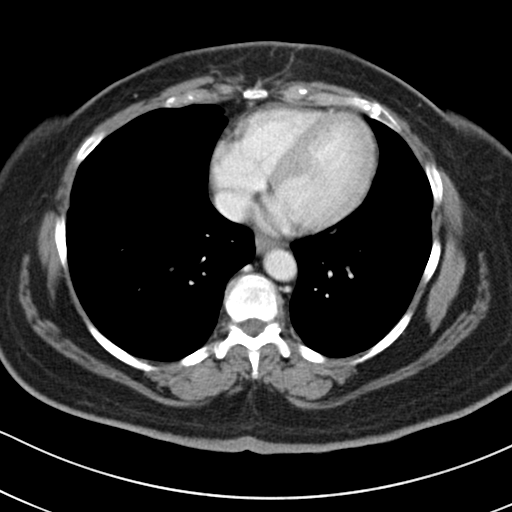
[im 73/77  lung]
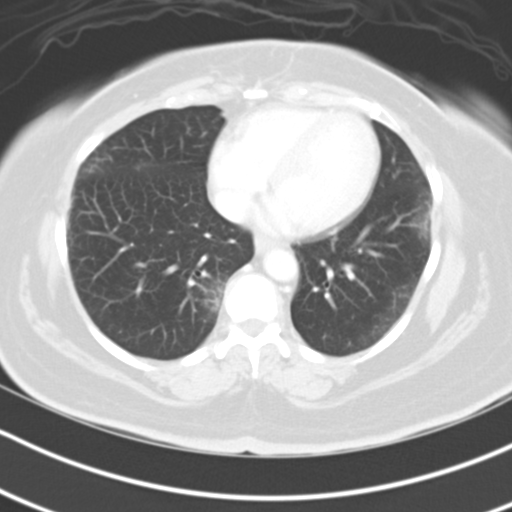

[13 of 32 positions shown; findings below may reference images not displayed]

FINDINGS: The right kidney is normal in appearance. The left kidney exhibits
stable changes from a previous partial nephrectomy of the posterior
aspect of the midpole. No surrounding inflammatory or soft tissue
masslike changes are demonstrated. On delayed images contrast within
the renal collecting systems and proximal ureters appears normal.

The liver exhibits mildly decreased density diffusely consistent
with fatty infiltration. The gallbladder is adequately distended
with no definite calcified stones. Sludge cannot be excluded
however. No pericholecystic inflammatory changes are demonstrated.
The pancreas, spleen, adrenal glands, and abdominal aorta exhibit no
acute or chronic abnormalities. The periaortic and pericaval regions
appear normal.

The stomach is nondistended. The orally administered contrast is
traversed the small bowel and reached as far distally as the
proximal rectosigmoid. There is no evidence of a small or large
bowel obstruction. The terminal ileum is normal in appearance. The
appendix is surgically absent. There is no pericecal inflammatory
change. There is no significant diverticulosis nor evidence of
diverticulitis nor other forms of colitis.

The partially distended urinary bladder is normal in appearance. The
uterus is surgically absent. There are no adnexal masses and there
is no free pelvic fluid. There is no inguinal hernia. There is a
tiny fat containing umbilical hernia.

The bony pelvis and the lumbar spine exhibit no acute abnormalities.
There is degenerative disc space narrowing at L5-S1.

The lung bases exhibit no acute abnormalities. Minimal subpleural
interstitial densities present in both lower lobes laterally.
IMPRESSION: 1. No definite gallstones are demonstrated but sludge cannot be
excluded. Gallbladder ultrasound is recommended. No acute
abnormality of the liver or gallbladder or spleen is demonstrated.
2. No acute gastrointestinal abnormalities are demonstrated.
3. The appearance of the left kidney is stable status post previous
partial nephrectomy. There are no findings within the abdomen or
pelvis to suggest lymphadenopathy. The right kidney is normal in
appearance.

## 2016-05-17 IMAGING — US US ABDOMEN LIMITED
1 series · 14 of 25 positions shown · non-contrast
Comparison: CT Abdomen and Pelvis 5919 hr the same day and earlier.

CLINICAL DATA: 72-year-old female with nausea and abdominal pain.
Right upper quadrant pain. Initial encounter.

EXAM:
US ABDOMEN LIMITED - RIGHT UPPER QUADRANT

[Series 1: us abdomen limited · 0.27mm/px · 14 of 44 slices shown]
[im 1/44]
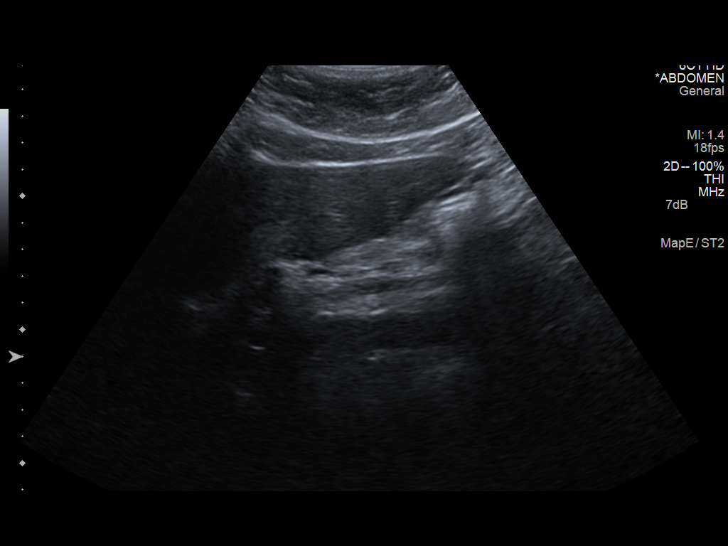
[im 4/44]
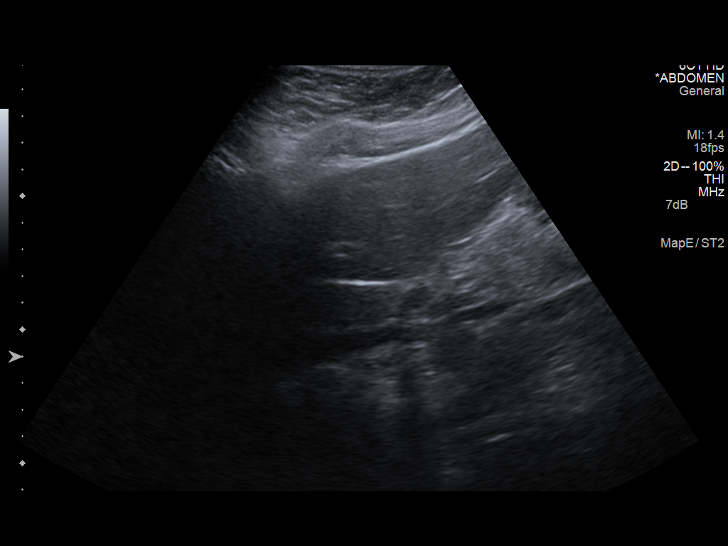
[im 8/44]
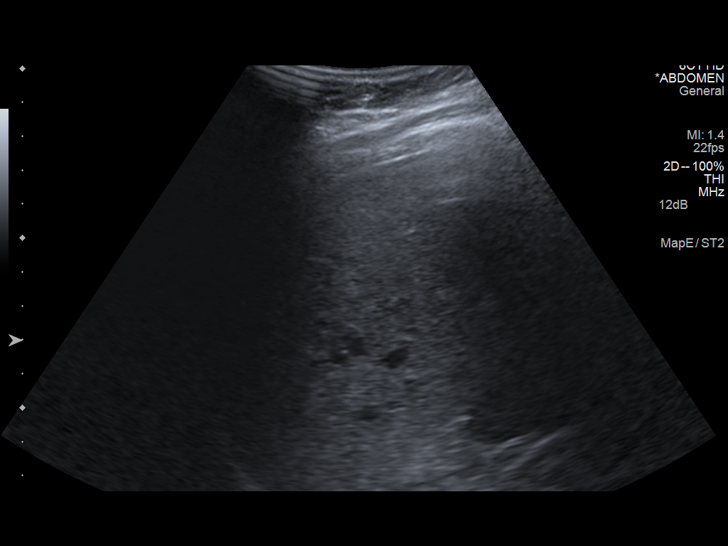
[im 11/44]
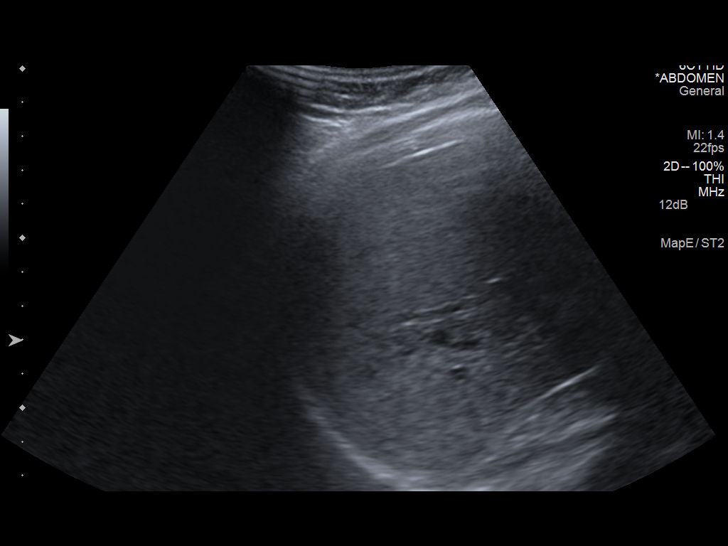
[im 15/44]
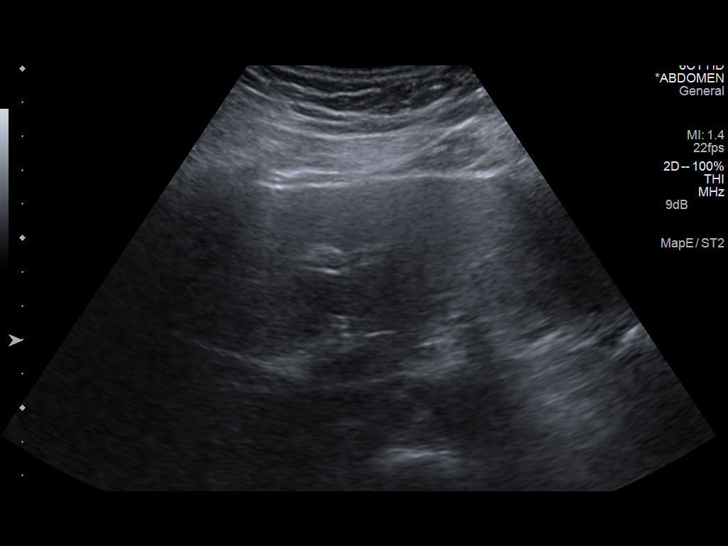
[im 17/44]
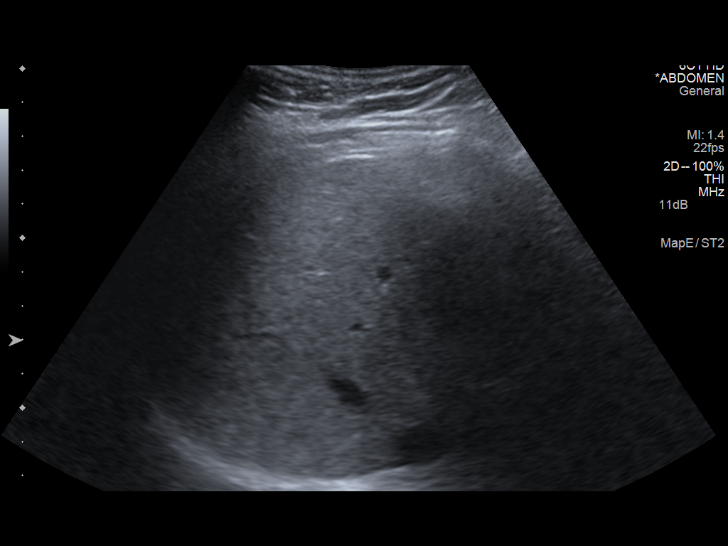
[im 20/44]
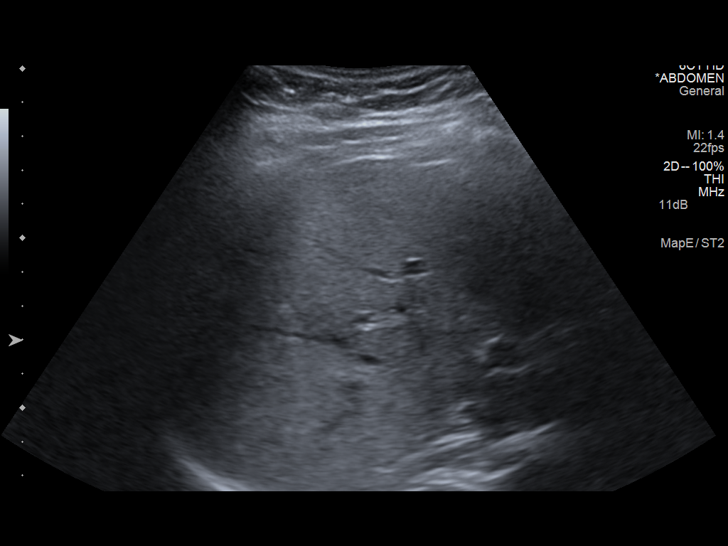
[im 24/44]
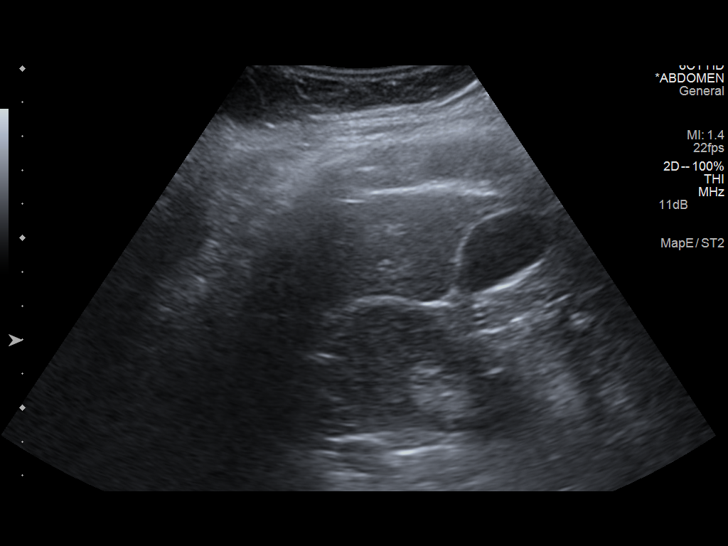
[im 27/44]
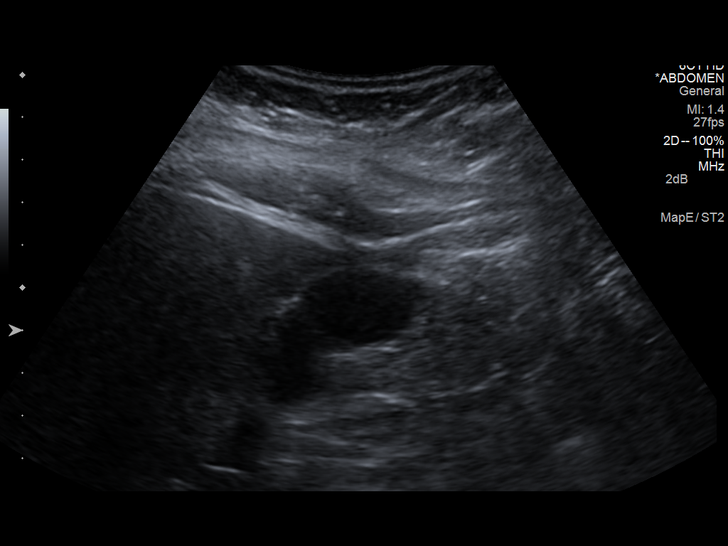
[im 29/44]
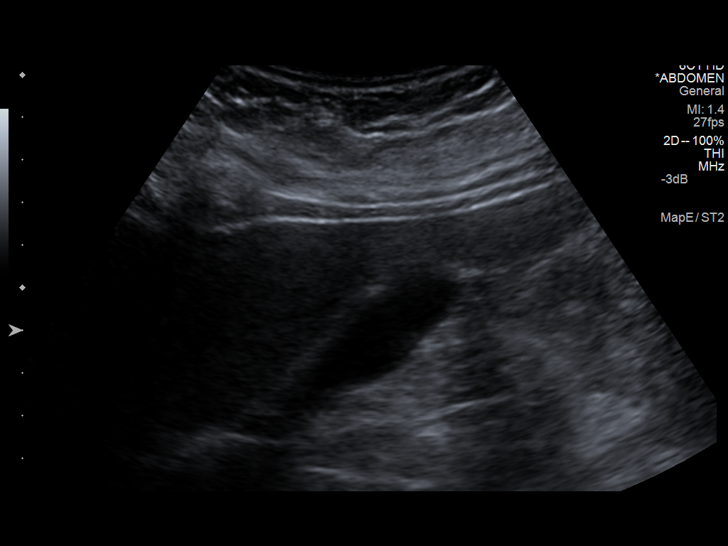
[im 33/44]
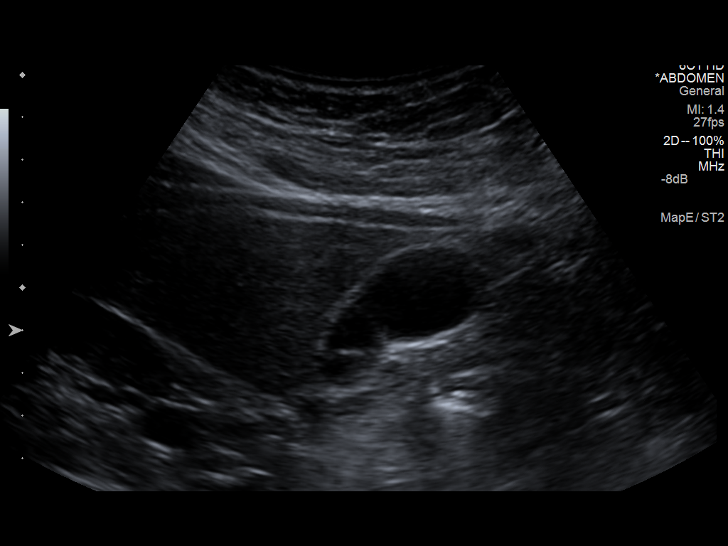
[im 36/44]
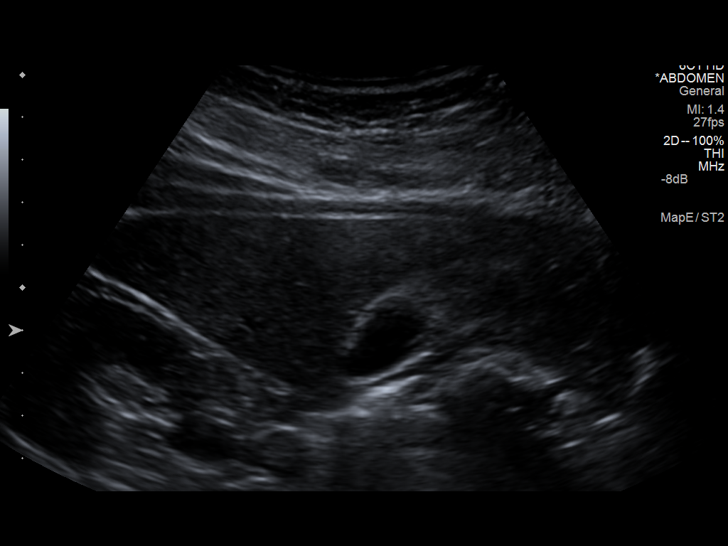
[im 40/44]
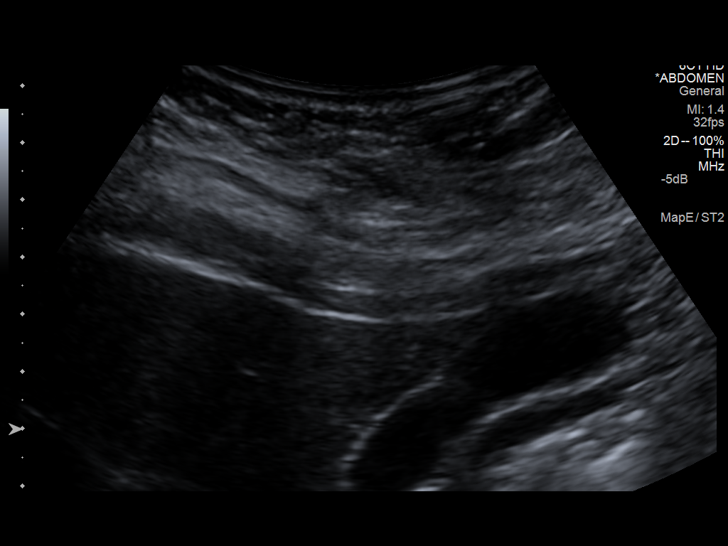
[im 44/44]
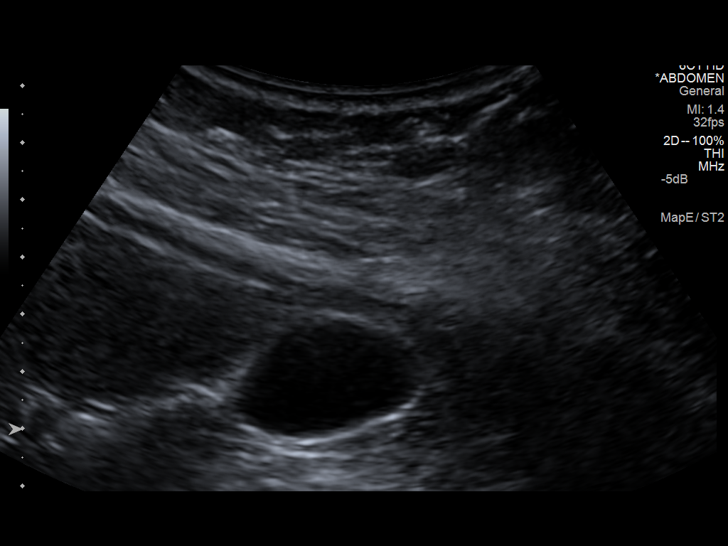

[14 of 25 positions shown; findings below may reference images not displayed]

FINDINGS: Gallbladder:

No sludge, gallstones or wall thickening visualized. No sonographic
Murphy sign noted. No pericholecystic fluid.

Common bile duct:

Diameter: 6 mm, normal

Liver:

Increased echogenicity diffusely. No intrahepatic ductal dilatation.
No discrete liver lesion.

Other findings:  Negative visible right kidney.
IMPRESSION: Negative gallbladder.  Hepatic steatosis.

## 2016-05-24 ENCOUNTER — Ambulatory Visit (INDEPENDENT_AMBULATORY_CARE_PROVIDER_SITE_OTHER): Payer: Medicare Other | Admitting: Ophthalmology

## 2016-06-07 ENCOUNTER — Ambulatory Visit (INDEPENDENT_AMBULATORY_CARE_PROVIDER_SITE_OTHER): Payer: Medicare Other | Admitting: Ophthalmology

## 2016-06-07 DIAGNOSIS — H43813 Vitreous degeneration, bilateral: Secondary | ICD-10-CM | POA: Diagnosis not present

## 2016-06-07 DIAGNOSIS — H35033 Hypertensive retinopathy, bilateral: Secondary | ICD-10-CM

## 2016-06-07 DIAGNOSIS — E113393 Type 2 diabetes mellitus with moderate nonproliferative diabetic retinopathy without macular edema, bilateral: Secondary | ICD-10-CM

## 2016-06-07 DIAGNOSIS — I10 Essential (primary) hypertension: Secondary | ICD-10-CM | POA: Diagnosis not present

## 2016-06-07 DIAGNOSIS — E11319 Type 2 diabetes mellitus with unspecified diabetic retinopathy without macular edema: Secondary | ICD-10-CM | POA: Diagnosis not present

## 2016-07-16 ENCOUNTER — Other Ambulatory Visit: Payer: Self-pay

## 2016-07-16 MED ORDER — GABAPENTIN 300 MG PO CAPS: 300.0000 mg | ORAL_CAPSULE | Freq: Every day | ORAL | 0 refills | Status: DC

## 2016-08-28 ENCOUNTER — Other Ambulatory Visit (HOSPITAL_COMMUNITY): Payer: Self-pay | Admitting: Urology

## 2016-08-28 DIAGNOSIS — C641 Malignant neoplasm of right kidney, except renal pelvis: Secondary | ICD-10-CM

## 2016-08-28 DIAGNOSIS — C642 Malignant neoplasm of left kidney, except renal pelvis: Secondary | ICD-10-CM

## 2016-10-07 ENCOUNTER — Other Ambulatory Visit: Payer: Self-pay | Admitting: Family Medicine

## 2016-10-07 ENCOUNTER — Ambulatory Visit
Admission: RE | Admit: 2016-10-07 | Discharge: 2016-10-07 | Disposition: A | Discharge status: Home / Self Care | Payer: Medicare Other | Source: Ambulatory Visit | Attending: Family Medicine | Admitting: Family Medicine

## 2016-10-07 DIAGNOSIS — R52 Pain, unspecified: Secondary | ICD-10-CM

## 2016-11-15 ENCOUNTER — Ambulatory Visit (HOSPITAL_COMMUNITY)
Admission: RE | Admit: 2016-11-15 | Discharge: 2016-11-15 | Disposition: A | Discharge status: Home / Self Care | Payer: Medicare Other | Source: Ambulatory Visit | Attending: Urology | Admitting: Urology

## 2016-11-15 DIAGNOSIS — C642 Malignant neoplasm of left kidney, except renal pelvis: Secondary | ICD-10-CM

## 2016-11-15 DIAGNOSIS — K760 Fatty (change of) liver, not elsewhere classified: Secondary | ICD-10-CM | POA: Insufficient documentation

## 2016-11-15 DIAGNOSIS — C641 Malignant neoplasm of right kidney, except renal pelvis: Secondary | ICD-10-CM | POA: Diagnosis not present

## 2016-11-15 DIAGNOSIS — Z905 Acquired absence of kidney: Secondary | ICD-10-CM | POA: Diagnosis not present

## 2016-11-15 DIAGNOSIS — Z9889 Other specified postprocedural states: Secondary | ICD-10-CM | POA: Diagnosis not present

## 2016-12-07 IMAGING — CR DG CHEST 2V
2 series · 2 of 2 positions shown · non-contrast
Comparison: 03/21/2013.

CLINICAL DATA: Renal cell carcinoma.

EXAM:
CHEST  2 VIEW

[w chest pa]
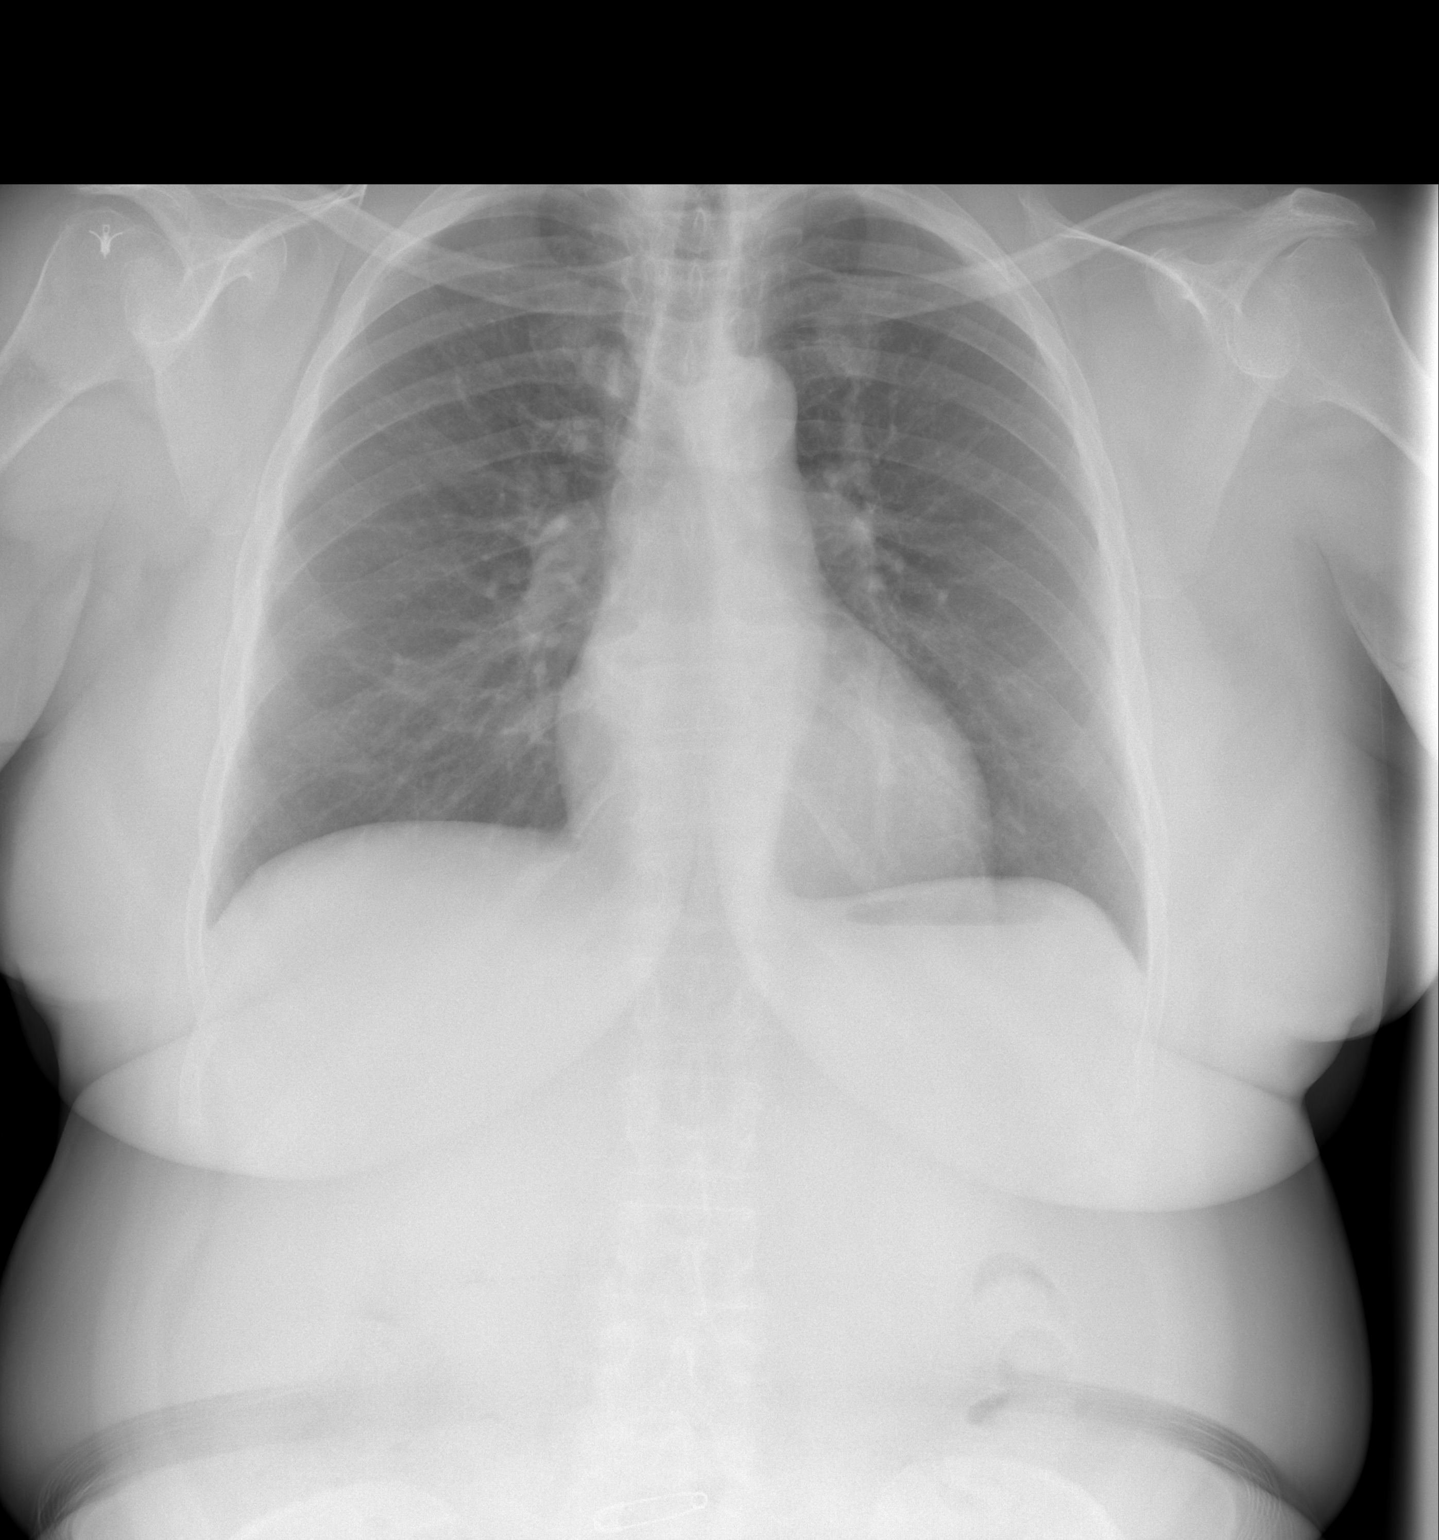

[w chest lat]
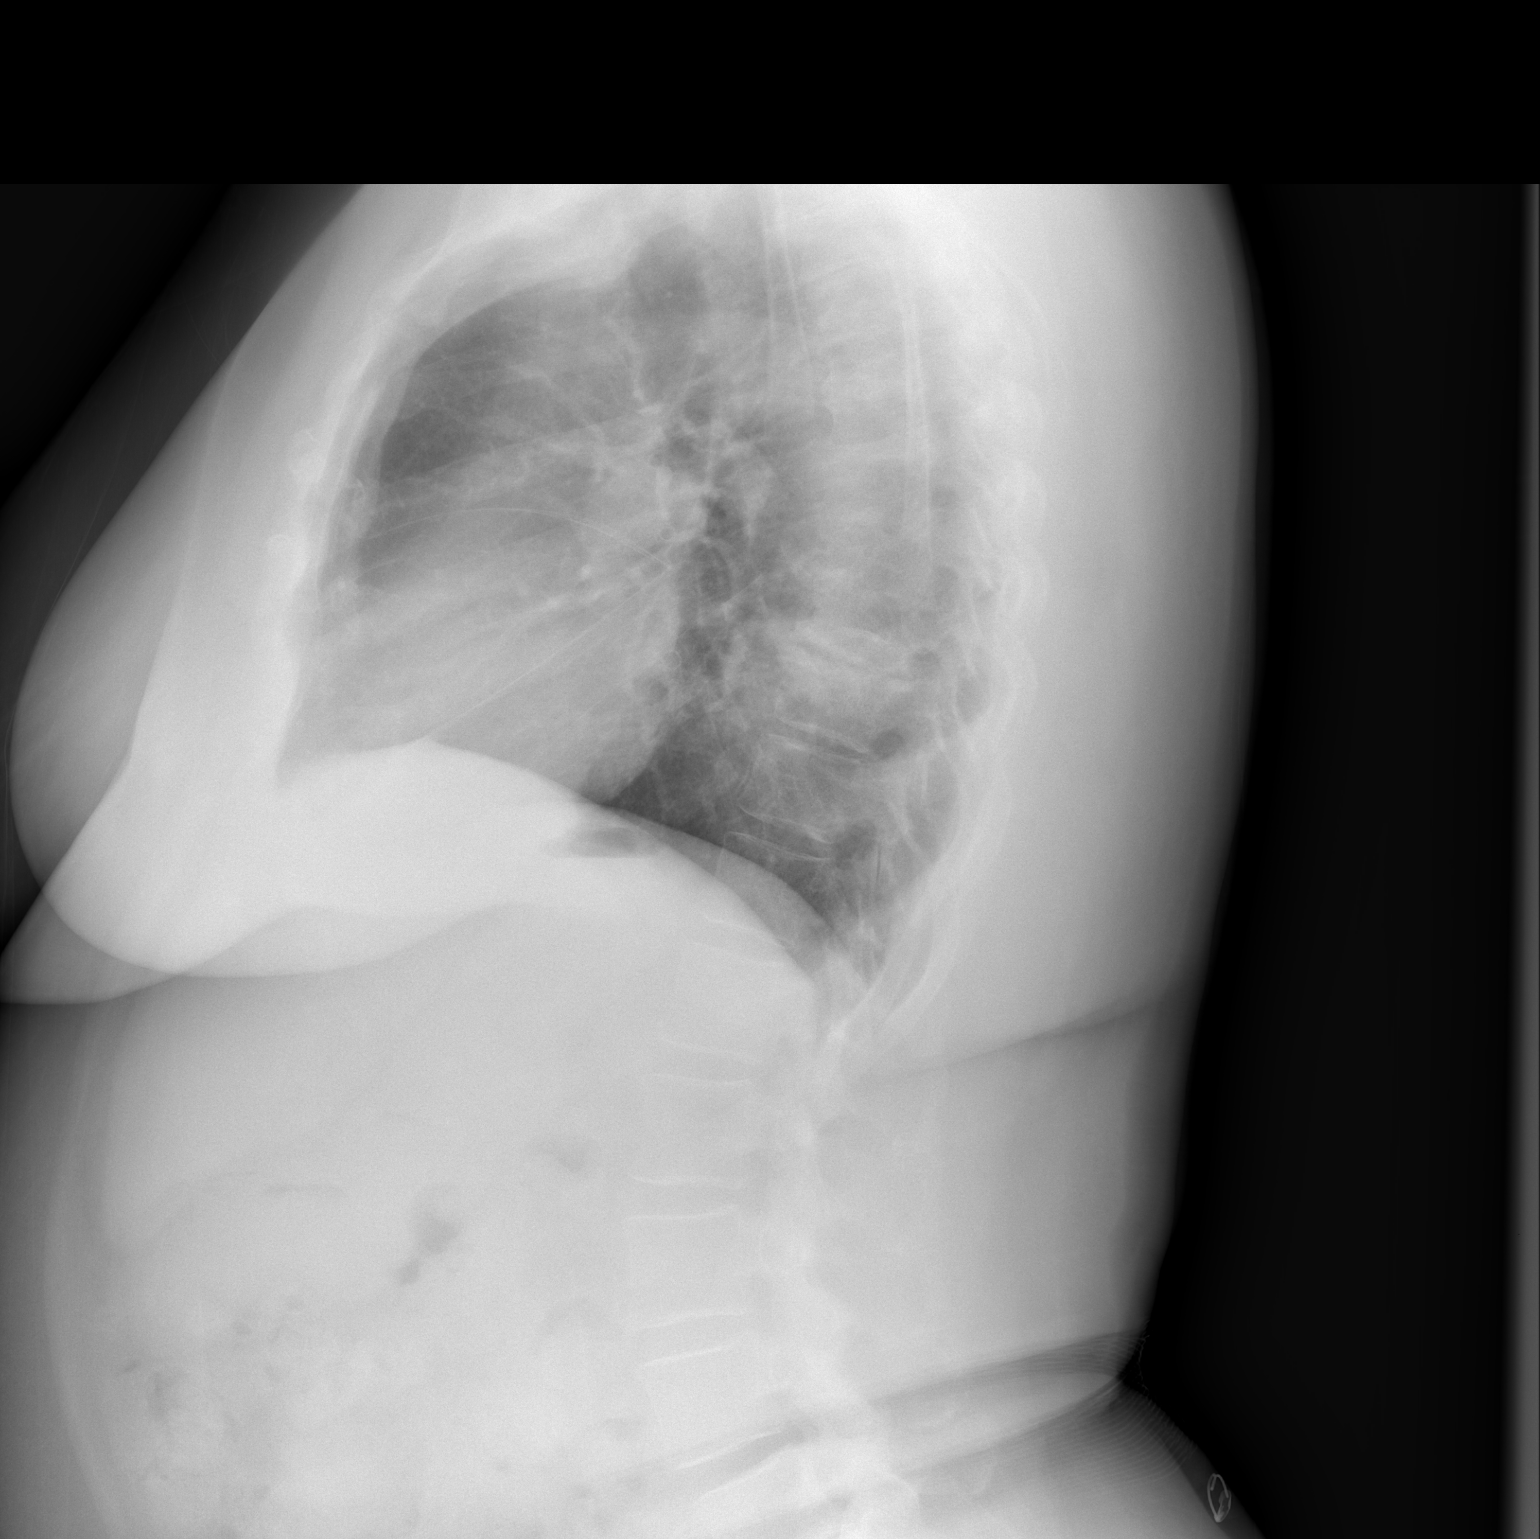

[2 of 2 positions shown; findings below may reference images not displayed]

FINDINGS: Mediastinum hilar structures are normal. Lungs are clear. Heart size
normal. No pleural effusion or pneumothorax. No acute bony
abnormality. Postsurgical changes right shoulder. Prior cervical
spine fusion.
IMPRESSION: No active cardiopulmonary disease.

## 2016-12-11 ENCOUNTER — Ambulatory Visit (INDEPENDENT_AMBULATORY_CARE_PROVIDER_SITE_OTHER): Payer: Medicare Other | Admitting: Ophthalmology

## 2016-12-11 DIAGNOSIS — H43813 Vitreous degeneration, bilateral: Secondary | ICD-10-CM

## 2016-12-11 DIAGNOSIS — H35033 Hypertensive retinopathy, bilateral: Secondary | ICD-10-CM

## 2016-12-11 DIAGNOSIS — E10319 Type 1 diabetes mellitus with unspecified diabetic retinopathy without macular edema: Secondary | ICD-10-CM | POA: Diagnosis not present

## 2016-12-11 DIAGNOSIS — I10 Essential (primary) hypertension: Secondary | ICD-10-CM

## 2016-12-11 DIAGNOSIS — E103393 Type 1 diabetes mellitus with moderate nonproliferative diabetic retinopathy without macular edema, bilateral: Secondary | ICD-10-CM

## 2017-02-20 ENCOUNTER — Encounter (HOSPITAL_COMMUNITY): Payer: Self-pay | Admitting: Emergency Medicine

## 2017-02-20 ENCOUNTER — Emergency Department (HOSPITAL_COMMUNITY): Payer: Medicare Other

## 2017-02-20 DIAGNOSIS — H81399 Other peripheral vertigo, unspecified ear: Secondary | ICD-10-CM | POA: Insufficient documentation

## 2017-02-20 DIAGNOSIS — M25551 Pain in right hip: Secondary | ICD-10-CM | POA: Diagnosis present

## 2017-02-20 DIAGNOSIS — M5431 Sciatica, right side: Secondary | ICD-10-CM | POA: Insufficient documentation

## 2017-02-20 DIAGNOSIS — E119 Type 2 diabetes mellitus without complications: Secondary | ICD-10-CM | POA: Diagnosis not present

## 2017-02-20 DIAGNOSIS — J45909 Unspecified asthma, uncomplicated: Secondary | ICD-10-CM | POA: Insufficient documentation

## 2017-02-20 DIAGNOSIS — Z794 Long term (current) use of insulin: Secondary | ICD-10-CM | POA: Diagnosis not present

## 2017-02-20 DIAGNOSIS — Z79899 Other long term (current) drug therapy: Secondary | ICD-10-CM | POA: Diagnosis not present

## 2017-02-20 NOTE — ED Triage Notes (Signed)
Patient reports today when she woke up her head was dizzy. Patient also c/o right hip pain that radiates dow right femur. Patient denies any falls or injuries. She had old hydrocodone that she had before and taken 1 every 12 hours past 2 days which will relieve pain but not take it away.

## 2017-02-20 NOTE — ED Triage Notes (Signed)
Patient is worse in hip area when she tries to walk.

## 2017-02-21 ENCOUNTER — Emergency Department (HOSPITAL_COMMUNITY)
Admission: EM | Admit: 2017-02-21 | Discharge: 2017-02-21 | Disposition: A | Discharge status: Home / Self Care | Payer: Medicare Other | Attending: Emergency Medicine | Admitting: Emergency Medicine

## 2017-02-21 ENCOUNTER — Emergency Department (HOSPITAL_COMMUNITY): Payer: Medicare Other

## 2017-02-21 DIAGNOSIS — M5431 Sciatica, right side: Secondary | ICD-10-CM

## 2017-02-21 DIAGNOSIS — H81399 Other peripheral vertigo, unspecified ear: Secondary | ICD-10-CM

## 2017-02-21 MED ORDER — HYDROCODONE-ACETAMINOPHEN 5-325 MG PO TABS: 1.0000 | ORAL_TABLET | ORAL | 0 refills | Status: DC | PRN

## 2017-02-21 MED ORDER — CYCLOBENZAPRINE HCL 10 MG PO TABS
10.0000 mg | ORAL_TABLET | Freq: Once | ORAL | Status: AC
Start: 2017-02-21 — End: 2017-02-21
  Administered 2017-02-21: 10 mg via ORAL
  Filled 2017-02-21: qty 1

## 2017-02-21 MED ORDER — MECLIZINE HCL 25 MG PO TABS
25.0000 mg | ORAL_TABLET | Freq: Once | ORAL | Status: AC
  Administered 2017-02-21: 25 mg via ORAL
  Filled 2017-02-21: qty 1

## 2017-02-21 MED ORDER — HYDROCODONE-ACETAMINOPHEN 5-325 MG PO TABS
1.0000 | ORAL_TABLET | Freq: Once | ORAL | Status: AC
  Administered 2017-02-21: 1 via ORAL
  Filled 2017-02-21: qty 1

## 2017-02-21 MED ORDER — MECLIZINE HCL 25 MG PO TABS: 25.0000 mg | ORAL_TABLET | Freq: Three times a day (TID) | ORAL | 0 refills | Status: DC | PRN

## 2017-02-21 MED ORDER — ORPHENADRINE CITRATE ER 100 MG PO TB12: 100.0000 mg | ORAL_TABLET | Freq: Two times a day (BID) | ORAL | 0 refills | Status: DC

## 2017-02-21 NOTE — Discharge Instructions (Signed)
Apply ice to your back several times a day. Return if symptoms are getting worse.

## 2017-02-21 NOTE — ED Provider Notes (Signed)
[facility] DEPT Provider Note   CSN: [id number] Arrival date & time: 02/20/17  3536     History   Chief Complaint Chief Complaint  Patient presents with  . Dizziness  . Hip Pain    HPI [name] is a 77 y.o. female.  The history is provided by the patient.  She has history of renal cell carcinoma, GERD, diabetes, asthma, hypertension, hyperlipidemia, vertigo and comes in with a 4-day history of pain in the right hip radiating down her right thigh.  There is some associated tingling in her leg going down toward her ankle.  Currently, she rates pain at 5/10.  However, and has been as severe as 10/10.  Yesterday, she was not able to walk because of the pain.  She had some hydrocodone-acetaminophen tablets left over from her surgery for nephrectomy, and has been taking those with partial relief.  She denies bowel or bladder dysfunction.  She denies any trauma, but states that over the holidays, she had been doing a lot of standing and cooking.  As a second complaint, this morning, she noted intense dizziness which was worse if she tried to raise up from supine position.  There was associated nausea.  This was different from her usual vertigo, but has resolved.  Past Medical History:  Diagnosis Date  . Anginal pain ([place])    just occurs without any specific trigger; pt states has never had to use nitroglycerin tabs; uses rest to relieve  . Arthritis   . Asthma   . Diabetes mellitus    Type 1  . Dysrhythmia    "irregular heart beat"  . GERD (gastroesophageal reflux disease)   . History of urinary tract infection   . Hypercholesteremia   . Hypertension   . left renal ca dx'd 2012 (?)   surg only, left kidney  . Nocturia   . Reflux   . Renal failure (ARF), acute on chronic (HCC)   . Shortness of breath dyspnea    pt denies; states can climb stairs w/o difficulty   . Sleep apnea    does not use c-pap machine  . Tingling in extremities    legs  bilat  . Tinnitus   . Urinary frequency   . Urinary incontinence   . Vertigo    occurs when lying flat     Patient Active Problem List   Diagnosis Date Noted  . Left shoulder pain   . Limited joint range of motion (ROM)   . Chest pain 10/23/2015  . Diabetes mellitus type 2 in obese ([place]) 10/23/2015  . Renal neoplasm 03/20/2015  . Right renal mass   . Chest pain with high risk for cardiac etiology 07/28/2014  . ACS (acute coronary syndrome) ([place]) 12/27/2010  . Diabetes mellitus 12/27/2010  . Hypertension 12/27/2010  . Hyperlipidemia 12/27/2010  . GERD (gastroesophageal reflux disease) 12/27/2010  . Renal cell adenocarcinoma ([place]) 12/27/2010  . Arthritis 12/27/2010  . Cervical stenosis of spine 12/27/2010  . Carpal tunnel syndrome 12/27/2010  . Asthma 12/27/2010    Past Surgical History:  Procedure Laterality Date  . ABDOMINAL HYSTERECTOMY    . APPENDECTOMY    . CARDIAC CATHETERIZATION N/A 07/28/2014   Procedure: Left Heart Cath and Coronary Angiography;  Surgeon: [name], MD;  Location: [facility];  Service: Cardiovascular;  Laterality: N/A;  . CYSTOSCOPY WITH RETROGRADE PYELOGRAM, URETEROSCOPY AND STENT PLACEMENT Right 02/09/2015   Procedure: CYSTOSCOPY WITH RETROGRADE PYELOGRAM AND URETEROSCOPY ;  Surgeon:  Raynelle Bring, MD;  Location: [facility];  Service: Urology;  Laterality: Right;  . LAPAROSCOPIC NEPHRECTOMY Right 03/20/2015   Procedure: LAPAROSCOPIC NEPHRECTOMY;  Surgeon: Raynelle Bring, MD;  Location: [facility];  Service: Urology;  Laterality: Right;  . PERIPHERAL VASCULAR CATHETERIZATION N/A 07/28/2014   Procedure: Aortic Arch Angiography;  Surgeon: [name], MD;  Location: [facility];  Service: Cardiovascular;  Laterality: N/A;  . RENAL MASS EXCISION    . SPINE SURGERY      OB History    No data available       Home Medications    Prior to Admission medications   Medication Sig Start Date End Date Taking? Authorizing Provider  albuterol  (PROVENTIL,VENTOLIN) 90 MCG/ACT inhaler Inhale 2 puffs into the lungs every 4 (four) hours as needed for wheezing or shortness of breath.     [provider]  amLODipine (NORVASC) 10 MG tablet Take 10 mg by mouth daily.    [provider]  aspirin EC 81 MG tablet Take 81 mg by mouth at bedtime.    [provider]  atorvastatin (LIPITOR) 10 MG tablet Take 10 mg by mouth every morning.     [provider]  azithromycin (ZITHROMAX) 250 MG tablet Take 250-500 mg by mouth as directed. Take 500mg  on day 1 then 250mg  for 5 days 01/26/16   [provider]  benzonatate (TESSALON) 100 MG capsule Take 1 capsule (100 mg total) by mouth every 8 (eight) hours. 02/01/16   Frederica Kuster, PA-C  cholecalciferol (VITAMIN D) 1000 units tablet Take 1,000 Units by mouth daily.    [provider]  gabapentin (NEURONTIN) 300 MG capsule Take 1 capsule (300 mg total) by mouth at bedtime. 07/16/16   Wallene Huh, DPM  HYDROcodone-acetaminophen (NORCO/VICODIN) 5-325 MG tablet Take 1-2 tablets by mouth every 4 (four) hours as needed for moderate pain. Patient not taking: Reported on 02/01/2016 10/25/15   Theodis Blaze, MD  Insulin Degludec (TRESIBA FLEXTOUCH) 200 UNIT/ML SOPN Inject 74 Units into the skin daily before breakfast.     [provider]  metoprolol tartrate (LOPRESSOR) 25 MG tablet Take 37.5 mg by mouth 2 (two) times daily.     [provider]  mirabegron ER (MYRBETRIQ) 50 MG TB24 tablet Take 50 mg by mouth daily.    [provider]  Multiple Vitamins-Minerals (CENTRUM SILVER ADULT 50+ PO) Take 1 tablet by mouth daily.    [provider]  nitroGLYCERIN (NITROSTAT) 0.4 MG SL tablet Place 0.4 mg under the tongue every 5 (five) minutes as needed for chest pain.    [provider]  zolpidem (AMBIEN) 5 MG tablet Take 1 tablet (5 mg total) by mouth at bedtime as needed for sleep. Patient not taking: Reported on 02/01/2016  10/25/15   Theodis Blaze, MD    Family History Family History  Problem Relation Age of Onset  . Hypertension Mother   . Coronary artery disease Unknown     Social History Social History   Tobacco Use  . Smoking status: Never Smoker  . Smokeless tobacco: Never Used  Substance Use Topics  . Alcohol use: No  . Drug use: No     Allergies   Sulfa antibiotics   Review of Systems Review of Systems  All other systems reviewed and are negative.    Physical Exam Updated Vital Signs BP (!) 155/78 (BP Location: Right Arm)   Pulse 65   Temp 97.9 F (36.6 C) (Oral)  Resp 18   SpO2 99%   Physical Exam  Nursing note and vitals reviewed.  77 year old [place], resting comfortably and in no acute distress. Vital signs are significant for hypertension. Oxygen saturation is 99%, which is normal. Head is normocephalic and atraumatic. PERRLA, EOMI. Oropharynx is clear. Neck is nontender and supple without adenopathy or JVD. Back has mild tenderness in the lower lumbar area.  There is also tenderness in the right paralumbar area.  Straight leg raise is negative.  There is no CVA tenderness. Lungs are clear without rales, wheezes, or rhonchi. Chest is nontender. Heart has regular rate and rhythm without murmur. Abdomen is soft, flat, nontender without masses or hepatosplenomegaly and peristalsis is normoactive. Extremities have no cyanosis or edema, full range of motion is present.  There is tenderness to palpation in the right posterior and lateral pelvis, but no tenderness over the hip joint. Skin is warm and dry without rash. Neurologic: Mental status is normal, cranial nerves are intact, there are no motor or sensory deficits.  Dizziness is elicited with passive head movement.  ED Treatments / Results  Labs (all labs ordered are listed, but only abnormal results are displayed) Labs Reviewed - No data to display  EKG  EKG Interpretation None       Radiology Dg Lumbar  Spine Complete  Result Date: 02/21/2017 CLINICAL DATA:  Low back and hip pain for 3 days.  No known injury. EXAM: LUMBAR SPINE - COMPLETE 4+ VIEW COMPARISON:  CT abdomen and pelvis 02/12/2015 FINDINGS: Five lumbar type vertebral bodies. No vertebral compression deformities. No focal bone lesion or bone destruction. Degenerative changes in the lumbar spine most severe at L5-S1. No significant change since previous CT study. Visualized sacrum appears intact. Calcifications demonstrated in the upper abdomen bilaterally may represent renal stones and/ or gallstones. IMPRESSION: Normal alignment of the lumbar spine. Degenerative changes. No acute displaced fractures identified. Calcifications in the upper abdomen bilaterally may represent renal and/ or gallbladder stones. Electronically Signed   By: Lucienne Capers M.D.   On: 02/21/2017 01:53   Dg Hip Unilat  With Pelvis 2-3 Views Right  Result Date: 02/20/2017 CLINICAL DATA:  Hip pain. EXAM: DG HIP (WITH OR WITHOUT PELVIS) 2-3V RIGHT COMPARISON:  None. FINDINGS: There is no evidence of hip fracture or dislocation. There is no evidence of arthropathy or other focal bone abnormality. IMPRESSION: Negative. Electronically Signed   By: Dorise Bullion III M.D   On: 02/20/2017 18:44    Procedures Procedures (including critical care time)  Medications Ordered in ED Medications  HYDROcodone-acetaminophen (NORCO/VICODIN) 5-325 MG per tablet 1 tablet (not administered)  cyclobenzaprine (FLEXERIL) tablet 10 mg (not administered)  meclizine (ANTIVERT) tablet 25 mg (25 mg Oral Given 02/21/17 0155)     Initial Impression / Assessment and Plan / ED Course  I have reviewed the triage vital signs and the nursing notes.  Pertinent labs & imaging results that were available during my care of the patient were reviewed by me and considered in my medical decision making (see chart for details).  Right-sided sciatica.  She has no prior history of back problems.  Given  history of renal cell carcinoma, will get lumbar spine x-rays to look for signs of metastatic disease.  Old records are reviewed, confirming partial left nephrectomy and complete right nephrectomy for renal cell carcinoma, no postoperative radiation or chemotherapy.  Dizziness does appear to be vertigo and she is given a dose of meclizine.  Unfortunately, given her renal  issues, NSAIDs are contraindicated.  Lumbar spine x-rays show mild degenerative changes but no evidence of any lytic lesions.  Dizziness is improved after meclizine.  She is discharged with prescriptions for hydrocodone-acetaminophen and orphenadrine as well as meclizine.  Follow-up with PCP in 1 week.  Return precautions discussed.  Final Clinical Impressions(s) / ED Diagnoses   Final diagnoses:  Sciatica, right side  Peripheral vertigo, unspecified laterality    ED Discharge Orders        Ordered    HYDROcodone-acetaminophen (NORCO/VICODIN) 5-325 MG tablet  Every 4 hours PRN     02/21/17 0217    meclizine (ANTIVERT) 25 MG tablet  3 times daily PRN     02/21/17 0217    orphenadrine (NORFLEX) 100 MG tablet  2 times daily     44/69/50 7225       Delora Fuel, MD 75/05/18 [phone]

## 2017-06-01 ENCOUNTER — Emergency Department (HOSPITAL_COMMUNITY)
Admission: EM | Admit: 2017-06-01 | Discharge: 2017-06-01 | Disposition: A | Discharge status: Home / Self Care | Payer: Medicare Other | Attending: Emergency Medicine | Admitting: Emergency Medicine

## 2017-06-01 ENCOUNTER — Emergency Department (HOSPITAL_COMMUNITY): Payer: Medicare Other

## 2017-06-01 ENCOUNTER — Encounter (HOSPITAL_COMMUNITY): Payer: Self-pay | Admitting: Emergency Medicine

## 2017-06-01 DIAGNOSIS — E860 Dehydration: Secondary | ICD-10-CM | POA: Insufficient documentation

## 2017-06-01 DIAGNOSIS — R109 Unspecified abdominal pain: Secondary | ICD-10-CM

## 2017-06-01 DIAGNOSIS — Z79899 Other long term (current) drug therapy: Secondary | ICD-10-CM | POA: Diagnosis not present

## 2017-06-01 DIAGNOSIS — R1084 Generalized abdominal pain: Secondary | ICD-10-CM | POA: Insufficient documentation

## 2017-06-01 DIAGNOSIS — H538 Other visual disturbances: Secondary | ICD-10-CM | POA: Diagnosis not present

## 2017-06-01 DIAGNOSIS — R110 Nausea: Secondary | ICD-10-CM | POA: Insufficient documentation

## 2017-06-01 DIAGNOSIS — E7800 Pure hypercholesterolemia, unspecified: Secondary | ICD-10-CM | POA: Insufficient documentation

## 2017-06-01 DIAGNOSIS — E109 Type 1 diabetes mellitus without complications: Secondary | ICD-10-CM | POA: Insufficient documentation

## 2017-06-01 DIAGNOSIS — Z7982 Long term (current) use of aspirin: Secondary | ICD-10-CM | POA: Diagnosis not present

## 2017-06-01 DIAGNOSIS — J45909 Unspecified asthma, uncomplicated: Secondary | ICD-10-CM | POA: Diagnosis not present

## 2017-06-01 DIAGNOSIS — Z794 Long term (current) use of insulin: Secondary | ICD-10-CM | POA: Diagnosis not present

## 2017-06-01 DIAGNOSIS — I10 Essential (primary) hypertension: Secondary | ICD-10-CM | POA: Diagnosis not present

## 2017-06-01 DIAGNOSIS — R5383 Other fatigue: Secondary | ICD-10-CM | POA: Diagnosis present

## 2017-06-01 LAB — COMPREHENSIVE METABOLIC PANEL
ALBUMIN: 4.2 g/dL (ref 3.5–5.0)
ALT: 23 U/L (ref 14–54)
ANION GAP: 11 (ref 5–15)
AST: 18 U/L (ref 15–41)
Alkaline Phosphatase: 78 U/L (ref 38–126)
BILIRUBIN TOTAL: 0.5 mg/dL (ref 0.3–1.2)
BUN: 23 mg/dL — ABNORMAL HIGH (ref 6–20)
CO2: 25 mmol/L (ref 22–32)
Calcium: 9.8 mg/dL (ref 8.9–10.3)
Chloride: 106 mmol/L (ref 101–111)
Creatinine, Ser: 1.71 mg/dL — ABNORMAL HIGH (ref 0.44–1.00)
GFR calc Af Amer: 32 mL/min — ABNORMAL LOW (ref >60)
GFR calc non Af Amer: 27 mL/min — ABNORMAL LOW (ref >60)
Glucose, Bld: 175 mg/dL — ABNORMAL HIGH (ref 65–99)
POTASSIUM: 4.1 mmol/L (ref 3.5–5.1)
Sodium: 142 mmol/L (ref 135–145)
TOTAL PROTEIN: 8.7 g/dL — AB (ref 6.5–8.1)

## 2017-06-01 LAB — URINALYSIS, ROUTINE W REFLEX MICROSCOPIC
Bilirubin Urine: NEGATIVE
Glucose, UA: 150 mg/dL — AB
Hgb urine dipstick: NEGATIVE
Ketones, ur: NEGATIVE mg/dL
LEUKOCYTES UA: NEGATIVE
NITRITE: NEGATIVE
PH: 6 (ref 5.0–8.0)
Protein, ur: NEGATIVE mg/dL
SPECIFIC GRAVITY, URINE: 1.006 (ref 1.005–1.030)

## 2017-06-01 LAB — CBC
HEMATOCRIT: 41.7 % (ref 36.0–46.0)
HEMOGLOBIN: 13.8 g/dL (ref 12.0–15.0)
MCH: 30.1 pg (ref 26.0–34.0)
MCHC: 33.1 g/dL (ref 30.0–36.0)
MCV: 91 fL (ref 78.0–100.0)
Platelets: 238 10*3/uL (ref 150–400)
RBC: 4.58 MIL/uL (ref 3.87–5.11)
RDW: 13.8 % (ref 11.5–15.5)
WBC: 4.9 10*3/uL (ref 4.0–10.5)

## 2017-06-01 LAB — I-STAT TROPONIN, ED: Troponin i, poc: 0 ng/mL (ref 0.00–0.08)

## 2017-06-01 LAB — LIPASE, BLOOD: LIPASE: 54 U/L — AB (ref 11–51)

## 2017-06-01 MED ORDER — IOPAMIDOL (ISOVUE-300) INJECTION 61%
INTRAVENOUS | Status: AC
  Filled 2017-06-01: qty 30

## 2017-06-01 MED ORDER — HYDROMORPHONE HCL 1 MG/ML IJ SOLN
0.5000 mg | Freq: Once | INTRAMUSCULAR | Status: DC
  Administered 2017-06-01: 0.5 mg via INTRAVENOUS
  Filled 2017-06-01: qty 1

## 2017-06-01 MED ORDER — ONDANSETRON 4 MG PO TBDP: 4.0000 mg | ORAL_TABLET | Freq: Three times a day (TID) | ORAL | 0 refills | Status: DC | PRN

## 2017-06-01 MED ORDER — DICYCLOMINE HCL 20 MG PO TABS: 20.0000 mg | ORAL_TABLET | Freq: Two times a day (BID) | ORAL | 0 refills | Status: DC | PRN

## 2017-06-01 MED ORDER — SODIUM CHLORIDE 0.9 % IV BOLUS
1000.0000 mL | Freq: Once | INTRAVENOUS | Status: AC
  Administered 2017-06-01: 1000 mL via INTRAVENOUS

## 2017-06-01 MED ORDER — FENTANYL CITRATE (PF) 100 MCG/2ML IJ SOLN: 50.0000 ug | Freq: Once | INTRAMUSCULAR | Status: DC

## 2017-06-01 MED ORDER — ONDANSETRON HCL 4 MG/2ML IJ SOLN
4.0000 mg | Freq: Once | INTRAMUSCULAR | Status: AC
  Administered 2017-06-01: 4 mg via INTRAVENOUS
  Filled 2017-06-01: qty 2

## 2017-06-01 MED ORDER — ONDANSETRON 4 MG PO TBDP: 4.0000 mg | ORAL_TABLET | Freq: Once | ORAL | Status: DC | PRN

## 2017-06-01 NOTE — ED Notes (Signed)
ED Provider at bedside. 

## 2017-06-01 NOTE — ED Triage Notes (Signed)
Pt c/o dizziness for about week with blurred vision C/o abd pains for about 4 days with nausea but no v/d. LBM yesterday and normal. Reports has urinary problems "that are in my chart you can se them".

## 2017-06-01 NOTE — ED Provider Notes (Signed)
[facility] DEPT Provider Note   CSN: [id number] Arrival date & time: 06/01/17  1251     History   Chief Complaint Chief Complaint  Patient presents with  . Dizziness  . Blurred Vision  . Abdominal Pain  . Nausea    HPI [name] is a 77 y.o. female.  HPI 77 year old female with extensive past medical history as below here with generalized fatigue.  The patient states that for the last several days, she felt generally tired.  She is had intermittent abdominal cramping that she describes as an aching, cramp-like sensation that comes and goes.  It is diffuse and is not in one area more than others.  She had associated mild nausea and poor appetite.  She is also had what she initially describes as dizziness, but on further assessment, is mild lightheadedness, worse upon standing.  She knows she is been peeing less than usual, but denies any urinary pain or frequency.  Denies any chest pain or shortness of breath.  No cough.  She has a history of nephrectomy in the past.  She denies any flank pain.  No known recent sick contacts.  No other complaints.  Past Medical History:  Diagnosis Date  . Anginal pain ([place])    just occurs without any specific trigger; pt states has never had to use nitroglycerin tabs; uses rest to relieve  . Arthritis   . Asthma   . Diabetes mellitus    Type 1  . Dysrhythmia    "irregular heart beat"  . GERD (gastroesophageal reflux disease)   . History of urinary tract infection   . Hypercholesteremia   . Hypertension   . left renal ca dx'd 2012 (?)   surg only, left kidney  . Nocturia   . Reflux   . Renal failure (ARF), acute on chronic (HCC)   . Shortness of breath dyspnea    pt denies; states can climb stairs w/o difficulty   . Sleep apnea    does not use c-pap machine  . Tingling in extremities    legs bilat  . Tinnitus   . Urinary frequency   . Urinary incontinence   . Vertigo    occurs when lying flat      Patient Active Problem List   Diagnosis Date Noted  . Left shoulder pain   . Limited joint range of motion (ROM)   . Chest pain 10/23/2015  . Diabetes mellitus type 2 in obese ([place]) 10/23/2015  . Renal neoplasm 03/20/2015  . Right renal mass   . Chest pain with high risk for cardiac etiology 07/28/2014  . ACS (acute coronary syndrome) ([place]) 12/27/2010  . Diabetes mellitus 12/27/2010  . Hypertension 12/27/2010  . Hyperlipidemia 12/27/2010  . GERD (gastroesophageal reflux disease) 12/27/2010  . Renal cell adenocarcinoma ([place]) 12/27/2010  . Arthritis 12/27/2010  . Cervical stenosis of spine 12/27/2010  . Carpal tunnel syndrome 12/27/2010  . Asthma 12/27/2010    Past Surgical History:  Procedure Laterality Date  . ABDOMINAL HYSTERECTOMY    . APPENDECTOMY    . CARDIAC CATHETERIZATION N/A 07/28/2014   Procedure: Left Heart Cath and Coronary Angiography;  Surgeon: [name], MD;  Location: [facility];  Service: Cardiovascular;  Laterality: N/A;  . CYSTOSCOPY WITH RETROGRADE PYELOGRAM, URETEROSCOPY AND STENT PLACEMENT Right 02/09/2015   Procedure: CYSTOSCOPY WITH RETROGRADE PYELOGRAM AND URETEROSCOPY ;  Surgeon: Raynelle Bring, MD;  Location: [facility];  Service: Urology;  Laterality: Right;  . LAPAROSCOPIC NEPHRECTOMY  Right 03/20/2015   Procedure: LAPAROSCOPIC NEPHRECTOMY;  Surgeon: Raynelle Bring, MD;  Location: [facility];  Service: Urology;  Laterality: Right;  . PERIPHERAL VASCULAR CATHETERIZATION N/A 07/28/2014   Procedure: Aortic Arch Angiography;  Surgeon: [name], MD;  Location: [facility];  Service: Cardiovascular;  Laterality: N/A;  . RENAL MASS EXCISION    . SPINE SURGERY       OB History   None      Home Medications    Prior to Admission medications   Medication Sig Start Date End Date Taking? Authorizing Provider  albuterol (PROVENTIL,VENTOLIN) 90 MCG/ACT inhaler Inhale 2 puffs into the lungs every 4 (four) hours as needed for wheezing or shortness of  breath.    Yes [provider]  amLODipine (NORVASC) 10 MG tablet Take 10 mg by mouth daily.   Yes [provider]  aspirin EC 81 MG tablet Take 81 mg by mouth at bedtime.   Yes [provider]  atorvastatin (LIPITOR) 10 MG tablet Take 10 mg by mouth every morning.    Yes [provider]  cholecalciferol (VITAMIN D) 1000 units tablet Take 1,000 Units by mouth daily.   Yes [provider]  donepezil (ARICEPT) 5 MG tablet Take 5 mg by mouth daily. 03/29/17  Yes [provider]  gabapentin (NEURONTIN) 300 MG capsule Take 1 capsule (300 mg total) by mouth at bedtime. 07/16/16  Yes Regal, Tamala Fothergill, DPM  Insulin Degludec (TRESIBA FLEXTOUCH) 200 UNIT/ML SOPN Inject 74 Units into the skin daily before breakfast.    Yes [provider]  metoprolol tartrate (LOPRESSOR) 25 MG tablet Take 25 mg by mouth 2 (two) times daily.    Yes [provider]  mirabegron ER (MYRBETRIQ) 50 MG TB24 tablet Take 50 mg by mouth daily.   Yes [provider]  Multiple Vitamins-Minerals (CENTRUM SILVER ADULT 50+ PO) Take 1 tablet by mouth daily.   Yes [provider]  TRADJENTA 5 MG TABS tablet Take 5 mg by mouth daily. 05/11/17  Yes [provider]  dicyclomine (BENTYL) 20 MG tablet Take 1 tablet (20 mg total) by mouth 2 (two) times daily as needed for spasms. 06/01/17   Duffy Bruce, MD  HYDROcodone-acetaminophen (NORCO/VICODIN) 5-325 MG tablet Take 1 tablet by mouth every 4 (four) hours as needed for moderate pain. Patient not taking: Reported on 2/87/8676 08/20/07   Delora Fuel, MD  meclizine (ANTIVERT) 25 MG tablet Take 1 tablet (25 mg total) by mouth 3 (three) times daily as needed for dizziness. Patient not taking: Reported on 4/70/9628 04/23/60   Delora Fuel, MD  nitroGLYCERIN (NITROSTAT) 0.4 MG SL tablet Place 0.4 mg under the tongue every 5 (five) minutes as needed for chest pain.    [provider]  ondansetron  (ZOFRAN ODT) 4 MG disintegrating tablet Take 1 tablet (4 mg total) by mouth every 8 (eight) hours as needed for nausea or vomiting. 06/01/17   Duffy Bruce, MD  orphenadrine (NORFLEX) 100 MG tablet Take 1 tablet (100 mg total) by mouth 2 (two) times daily. Patient not taking: Reported on 9/47/6546 5/0/35   Delora Fuel, MD    Family History Family History  Problem Relation Age of Onset  . Hypertension Mother   . Coronary artery disease Unknown     Social History Social History   Tobacco Use  . Smoking status: Never Smoker  . Smokeless tobacco: Never Used  Substance Use Topics  . Alcohol use: No  . Drug use: No  Frequency: 1.0 times per week     Allergies   Sulfa antibiotics   Review of Systems Review of Systems  Constitutional: Positive for fatigue. Negative for chills and fever.  HENT: Negative for congestion, rhinorrhea and sore throat.   Eyes: Negative for visual disturbance.  Respiratory: Negative for cough, shortness of breath and wheezing.   Cardiovascular: Negative for chest pain and leg swelling.  Gastrointestinal: Positive for abdominal pain and nausea. Negative for diarrhea and vomiting.  Genitourinary: Negative for dysuria, flank pain, vaginal bleeding and vaginal discharge.  Musculoskeletal: Negative for neck pain.  Skin: Negative for rash.  Allergic/Immunologic: Negative for immunocompromised state.  Neurological: Positive for weakness and light-headedness. Negative for syncope and headaches.  Hematological: Does not bruise/bleed easily.  All other systems reviewed and are negative.    Physical Exam Updated Vital Signs BP (!) 171/80   Pulse 60   Temp 97.8 F (36.6 C) (Oral)   Resp 12   SpO2 97%   Physical Exam  Constitutional: She is oriented to person, place, and time. She appears well-developed and well-nourished. No distress.  HENT:  Head: Normocephalic and atraumatic.  Eyes: Conjunctivae are normal.  Neck: Neck supple.    Cardiovascular: Normal rate, regular rhythm and normal heart sounds. Exam reveals no friction rub.  No murmur heard. Pulmonary/Chest: Effort normal and breath sounds normal. No respiratory distress. She has no wheezes. She has no rales.  Abdominal: She exhibits no distension. There is no tenderness. There is no rigidity, no rebound and no guarding.  Musculoskeletal: She exhibits no edema.  Neurological: She is alert and oriented to person, place, and time. She has normal strength. No cranial nerve deficit or sensory deficit. She exhibits normal muscle tone. GCS eye subscore is 4. GCS verbal subscore is 5. GCS motor subscore is 6.  Skin: Skin is warm. Capillary refill takes less than 2 seconds.  Psychiatric: She has a normal mood and affect.  Nursing note and vitals reviewed.    ED Treatments / Results  Labs (all labs ordered are listed, but only abnormal results are displayed) Labs Reviewed  LIPASE, BLOOD - Abnormal; Notable for the following components:      Result Value   Lipase 54 (*)    All other components within normal limits  COMPREHENSIVE METABOLIC PANEL - Abnormal; Notable for the following components:   Glucose, Bld 175 (*)    BUN 23 (*)    Creatinine, Ser 1.71 (*)    Total Protein 8.7 (*)    GFR calc non Af Amer 27 (*)    GFR calc Af Amer 32 (*)    All other components within normal limits  URINALYSIS, ROUTINE W REFLEX MICROSCOPIC - Abnormal; Notable for the following components:   Color, Urine STRAW (*)    Glucose, UA 150 (*)    All other components within normal limits  CBC  I-STAT TROPONIN, ED    EKG EKG Interpretation  Date/Time:  Sunday June 01 2017 16:35:44 EDT Ventricular Rate:  61 PR Interval:    QRS Duration: 77 QT Interval:  422 QTC Calculation: 426 R Axis:   50 Text Interpretation:  Sinus rhythm Baseline wander in lead(s) III No significant change since last tracing Confirmed by Duffy Bruce [phone]) on 06/01/2017 6:02:42 PM   Radiology Ct  Abdomen Pelvis Wo Contrast  Result Date: 06/01/2017 CLINICAL DATA:  Dizziness for 1 week, blurred vision, abdominal pain and nausea for 4 days, urinary problems, history asthma, diabetes mellitus, hypertension, and renal cell  carcinoma post RIGHT nephrectomy, GERD EXAM: CT ABDOMEN AND PELVIS WITHOUT CONTRAST TECHNIQUE: Multidetector CT imaging of the abdomen and pelvis was performed following the standard protocol without IV contrast. Sagittal and coronal MPR images reconstructed from axial data set. Patient drank dilute oral contrast for exam COMPARISON:  02/12/2015 FINDINGS: Lower chest: Minimal peripheral chronic interstitial changes at lung bases without acute infiltrate Hepatobiliary: Gallbladder and liver normal appearance Pancreas: Normal appearance Spleen: Normal appearance Adrenals/Urinary Tract: Post RIGHT nephrectomy. Adrenal glands normal. LEFT kidney normal appearance. No urinary tract calcification or LEFT hydroureteronephrosis. Bladder unremarkable. Stomach/Bowel: Single diverticulum at ascending colon. Stomach and bowel loops otherwise normal appearance. Appendix surgically absent by history. Vascular/Lymphatic: Minor atherosclerotic calcifications aorta and iliac arteries without aneurysm. No adenopathy. Few pelvic phleboliths. Reproductive: Uterus surgically absent with unremarkable LEFT ovary and nonvisualization of RIGHT ovary Other: No free air or free fluid. Small umbilical and supraumbilical ventral hernias containing fat. Musculoskeletal: No acute osseous lesions. IMPRESSION: Post RIGHT nephrectomy, hysterectomy, and appendectomy. No acute intra-abdominal or intrapelvic abnormalities. Small umbilical and supraumbilical ventral hernias containing fat. Electronically Signed   By: Lavonia Dana M.D.   On: 06/01/2017 17:46   Dg Chest 2 View  Result Date: 06/01/2017 CLINICAL DATA:  Dizziness with blurred vision.  Pain. EXAM: CHEST - 2 VIEW COMPARISON:  November 15, 2016 FINDINGS: There is no  edema or consolidation. The heart size and pulmonary vascularity are normal. No adenopathy. There is postoperative change in the lower cervical spine region. IMPRESSION: No edema or consolidation. Electronically Signed   By: Lowella Grip III M.D.   On: 06/01/2017 17:40   Ct Head Wo Contrast  Result Date: 06/01/2017 CLINICAL DATA:  Dizziness for 1 week, blurred vision, acute headache, abdominal pain with nausea for 4 days, history of asthma, type I diabetes mellitus, hypertension, renal cell carcinoma EXAM: CT HEAD WITHOUT CONTRAST TECHNIQUE: Contiguous axial images were obtained from the base of the skull through the vertex without intravenous contrast. Sagittal and coronal MPR images reconstructed from axial data set. COMPARISON:  06/07/2012 FINDINGS: Brain: Mild atrophy. Normal ventricular morphology. No midline shift or mass effect. Otherwise normal appearance of brain parenchyma. No intracranial hemorrhage, mass lesion or evidence of acute infarction. No extra-axial fluid collections. Vascular: Mild atherosclerotic calcification of internal carotid arteries at skull base Skull: Intact Sinuses/Orbits: Clear Other: N/A IMPRESSION: No acute intracranial abnormalities. Electronically Signed   By: Lavonia Dana M.D.   On: 06/01/2017 17:39    Procedures Procedures (including critical care time)  Medications Ordered in ED Medications  iopamidol (ISOVUE-300) 61 % injection (has no administration in time range)  fentaNYL (SUBLIMAZE) injection 50 mcg (50 mcg Intravenous Not Given 06/01/17 1604)  sodium chloride 0.9 % bolus 1,000 mL (1,000 mLs Intravenous New Bag/Given 06/01/17 1604)  ondansetron (ZOFRAN) injection 4 mg (4 mg Intravenous Given 06/01/17 1604)     Initial Impression / Assessment and Plan / ED Course  I have reviewed the triage vital signs and the nursing notes.  Pertinent labs & imaging results that were available during my care of the patient were reviewed by me and considered in my  medical decision making (see chart for details).     77 year old female with past medical history as above here with mild nausea, intermittent abdominal cramping, and occasional lightheadedness.  Patient has a history of similar lightheadedness.  She said poor p.o. intake.  I suspect she likely has viral GI illness primary nausea.  She does have a minimally elevated lipase but this is  just slightly above borderline.  Otherwise, full medical workup reveals no acute emergent pathology.  EKG is nonischemic with negative troponin I do not suspect ACS or referred anginal symptoms.  Her CT head is negative and she has no focal neurological deficits to suggest stroke.  Her symptoms are consistent with mild dehydration and not a true vertigo.  UA is without UTI.  CMP shows baseline chronic kidney disease that is at its baseline.  She is been given fluids for possible mild dehydration clinically.  CT abdomen and pelvis negative.  Chest x-ray clear.  She feels improved after fluids and antiemetics in the ED.  Will DC with Zofran, Bentyl as needed, and good return precautions with 24 4 8-hour PCP follow-up.  Final Clinical Impressions(s) / ED Diagnoses   Final diagnoses:  Nausea  Dehydration  Abdominal cramping    ED Discharge Orders        Ordered    ondansetron (ZOFRAN ODT) 4 MG disintegrating tablet  Every 8 hours PRN     06/01/17 1826    dicyclomine (BENTYL) 20 MG tablet  2 times daily PRN     06/01/17 1826       Duffy Bruce, MD 06/01/17 1827

## 2017-06-18 ENCOUNTER — Encounter (INDEPENDENT_AMBULATORY_CARE_PROVIDER_SITE_OTHER): Payer: Medicare Other | Admitting: Ophthalmology

## 2017-07-09 ENCOUNTER — Telehealth: Payer: Self-pay | Admitting: Neurology

## 2017-07-09 ENCOUNTER — Ambulatory Visit: Payer: Medicare Other | Admitting: Neurology

## 2017-07-09 ENCOUNTER — Encounter: Payer: Self-pay | Admitting: Neurology

## 2017-07-09 VITALS — BP 173/78 | HR 71 | Ht 59.0 in | Wt 155.0 lb

## 2017-07-09 DIAGNOSIS — G4733 Obstructive sleep apnea (adult) (pediatric): Secondary | ICD-10-CM | POA: Diagnosis not present

## 2017-07-09 DIAGNOSIS — R413 Other amnesia: Secondary | ICD-10-CM

## 2017-07-09 DIAGNOSIS — E669 Obesity, unspecified: Secondary | ICD-10-CM | POA: Diagnosis not present

## 2017-07-09 DIAGNOSIS — Z789 Other specified health status: Secondary | ICD-10-CM | POA: Diagnosis not present

## 2017-07-09 DIAGNOSIS — N189 Chronic kidney disease, unspecified: Secondary | ICD-10-CM | POA: Diagnosis not present

## 2017-07-09 NOTE — Progress Notes (Signed)
Subjective:    Patient ID: [name] is a 77 y.o. female.  HPI     Star Age, MD, PhD [facility] [address], [address] P.O. [place], [place] [address]  Dear Dr. Marval Regal,   I saw your patient, [name], upon your kind request, in my neurologic clinic today for initial consultation of her memory loss. The patient is unaccompanied today. As you know, [name] is a 77 year old right-handed woman with an underlying complex medical history of diabetes, asthma, reflux disease, hypertension, hyperlipidemia, kidney cancer with status post left nephrectomy, sleep apnea, vertigo, chronic kidney disease, arthritis and obesity, who reports short-term memory loss for the past year. She has issues remembering names and dates, short-term memory loss. She does not have a family history of dementia as far she knows. She is an only child. She has one daughter who lives in [place] and her son died at the age of 61. She is widowed and lives alone. She believes that her memory loss is due to age. I reviewed your office note from 05/16/2017, which you kindly included. She has been on Aricept.  She has a diagnosis of sleep apnea but could not tolerate CPAP. She would be willing to consider CPAP again.   She had a brain MRI without contrast on 10/04/2005 and I reviewed the results: IMPRESSION:  1. No acute intracranial abnormality.   2. Greater than expected subcortical T2 hyperintensities bilaterally. These were described on previous MRI of 2002. They remain non specific. Differential diagnosis includes microvascular ischemia, chronic migraine headaches, demyelinating process or vasculitis or the sequelae of prior infection or inflammation.   3. Mild cerebellar tonsillar ectopia. This is likely to be incidental.  She had a head CT without contrast on 06/01/2017 and I reviewed the results: IMPRESSION: No acute intracranial abnormalities. She presented to the emergency  room on 06/01/2017 with blurry vision, abdominal pain, nausea and dizziness. I reviewed the emergency room records. She was treated symptomatically with antiemetic medication and IV fluids for suspected viral GI illness.  Her Past Medical History Is Significant For: Past Medical History:  Diagnosis Date  . Anginal pain ([place])    just occurs without any specific trigger; pt states has never had to use nitroglycerin tabs; uses rest to relieve  . Arthritis   . Asthma   . Diabetes mellitus    Type 1  . Dysrhythmia    "irregular heart beat"  . GERD (gastroesophageal reflux disease)   . History of urinary tract infection   . Hypercholesteremia   . Hypertension   . left renal ca dx'd 2012 (?)   surg only, left kidney  . Nocturia   . Reflux   . Renal failure (ARF), acute on chronic (HCC)   . Shortness of breath dyspnea    pt denies; states can climb stairs w/o difficulty   . Sleep apnea    does not use c-pap machine  . Tingling in extremities    legs bilat  . Tinnitus   . Urinary frequency   . Urinary incontinence   . Vertigo    occurs when lying flat     Her Past Surgical History Is Significant For: Past Surgical History:  Procedure Laterality Date  . ABDOMINAL HYSTERECTOMY    . APPENDECTOMY    . CARDIAC CATHETERIZATION N/A 07/28/2014   Procedure: Left Heart Cath and Coronary Angiography;  Surgeon: [name], MD;  Location: [facility];  Service: Cardiovascular;  Laterality: N/A;  .  CYSTOSCOPY WITH RETROGRADE PYELOGRAM, URETEROSCOPY AND STENT PLACEMENT Right 02/09/2015   Procedure: CYSTOSCOPY WITH RETROGRADE PYELOGRAM AND URETEROSCOPY ;  Surgeon: Raynelle Bring, MD;  Location: [facility];  Service: Urology;  Laterality: Right;  . LAPAROSCOPIC NEPHRECTOMY Right 03/20/2015   Procedure: LAPAROSCOPIC NEPHRECTOMY;  Surgeon: Raynelle Bring, MD;  Location: [facility];  Service: Urology;  Laterality: Right;  . PERIPHERAL VASCULAR CATHETERIZATION N/A 07/28/2014   Procedure: Aortic Arch  Angiography;  Surgeon: [name], MD;  Location: [facility];  Service: Cardiovascular;  Laterality: N/A;  . RENAL MASS EXCISION    . SPINE SURGERY      Her Family History Is Significant For: Family History  Problem Relation Age of Onset  . Hypertension Mother   . Coronary artery disease Unknown     Her Social History Is Significant For: Social History   Socioeconomic History  . Marital status: Widowed    Spouse name: Not on file  . Number of children: Not on file  . Years of education: Not on file  . Highest education level: Not on file  Occupational History  . Not on file  Social Needs  . Financial resource strain: Not on file  . Food insecurity:    Worry: Not on file    Inability: Not on file  . Transportation needs:    Medical: Not on file    Non-medical: Not on file  Tobacco Use  . Smoking status: Never Smoker  . Smokeless tobacco: Never Used  Substance and Sexual Activity  . Alcohol use: No  . Drug use: No    Frequency: 1.0 times per week  . Sexual activity: Never  Lifestyle  . Physical activity:    Days per week: Not on file    Minutes per session: Not on file  . Stress: Not on file  Relationships  . Social connections:    Talks on phone: Not on file    Gets together: Not on file    Attends religious service: Not on file    Active member of club or organization: Not on file    Attends meetings of clubs or organizations: Not on file    Relationship status: Not on file  Other Topics Concern  . Not on file  Social History Narrative  . Not on file    Her Allergies Are:  Allergies  Allergen Reactions  . Sulfa Antibiotics Itching  :   Her Current Medications Are:  Outpatient Encounter Medications as of 07/09/2017  Medication Sig  . albuterol (PROVENTIL,VENTOLIN) 90 MCG/ACT inhaler Inhale 2 puffs into the lungs every 4 (four) hours as needed for wheezing or shortness of breath.   Marland Kitchen amLODipine (NORVASC) 10 MG tablet Take 10 mg by mouth daily.   Marland Kitchen aspirin EC 81 MG tablet Take 81 mg by mouth at bedtime.  Marland Kitchen atorvastatin (LIPITOR) 10 MG tablet Take 10 mg by mouth every morning.   . cholecalciferol (VITAMIN D) 1000 units tablet Take 1,000 Units by mouth daily.  Marland Kitchen donepezil (ARICEPT) 5 MG tablet Take 5 mg by mouth daily.  Marland Kitchen gabapentin (NEURONTIN) 300 MG capsule Take 1 capsule (300 mg total) by mouth at bedtime.  . Insulin Degludec (TRESIBA FLEXTOUCH) 200 UNIT/ML SOPN Inject 74 Units into the skin daily before breakfast.   . metoprolol tartrate (LOPRESSOR) 25 MG tablet Take 25 mg by mouth 2 (two) times daily.   . mirabegron ER (MYRBETRIQ) 50 MG TB24 tablet Take 50 mg by mouth daily.  . Multiple Vitamins-Minerals (CENTRUM  SILVER ADULT 50+ PO) Take 1 tablet by mouth daily.  . nitroGLYCERIN (NITROSTAT) 0.4 MG SL tablet Place 0.4 mg under the tongue every 5 (five) minutes as needed for chest pain.  Marland Kitchen ondansetron (ZOFRAN ODT) 4 MG disintegrating tablet Take 1 tablet (4 mg total) by mouth every 8 (eight) hours as needed for nausea or vomiting.  . TRADJENTA 5 MG TABS tablet Take 5 mg by mouth daily.  . [DISCONTINUED] dicyclomine (BENTYL) 20 MG tablet Take 1 tablet (20 mg total) by mouth 2 (two) times daily as needed for spasms.  . [DISCONTINUED] HYDROcodone-acetaminophen (NORCO/VICODIN) 5-325 MG tablet Take 1 tablet by mouth every 4 (four) hours as needed for moderate pain. (Patient not taking: Reported on 06/01/2017)  . [DISCONTINUED] meclizine (ANTIVERT) 25 MG tablet Take 1 tablet (25 mg total) by mouth 3 (three) times daily as needed for dizziness. (Patient not taking: Reported on 06/01/2017)  . [DISCONTINUED] orphenadrine (NORFLEX) 100 MG tablet Take 1 tablet (100 mg total) by mouth 2 (two) times daily. (Patient not taking: Reported on 06/01/2017)   No facility-administered encounter medications on file as of 07/09/2017.   :   Review of Systems:  Out of a complete 14 point review of systems, all are reviewed and negative with the exception of  these symptoms as listed below: Review of Systems  Neurological:       Pt presents today to discuss her memory. Pt thinks her memory decline is related to her age.    Objective:  Neurological Exam  Physical Exam Physical Examination:   Vitals:   07/09/17 1436  BP: (!) 173/78  Pulse: 71    General Examination: The patient is a very pleasant 77 y.o. female in no acute distress. She appears well-developed and well-nourished and well groomed.   HEENT: Normocephalic, atraumatic, pupils are equal, round and reactive to light and accommodation. Extraocular tracking is good without limitation to gaze excursion or nystagmus noted. Normal smooth pursuit is noted. Hearing is grossly intact. Face is symmetric with normal facial animation and normal facial sensation. Speech is clear with no dysarthria noted. There is no hypophonia. There is no lip, neck/head, jaw or voice tremor. Neck is supple with full range of passive and active motion. There are no carotid bruits on auscultation. Oropharynx exam reveals: mild mouth dryness, adequate dental hygiene with no teeth on top and moderate airway crowding, due to redundant soft palate, tonsils are 1+, Mallampati is class II.  Chest: Clear to auscultation without wheezing, rhonchi or crackles noted.  Heart: S1+S2+0, regular and normal without murmurs, rubs or gallops noted.   Abdomen: Soft, non-tender and non-distended with normal bowel sounds appreciated on auscultation.  Extremities: There is 1+ pitting edema in the distal lower extremities bilaterally, Mostly around the ankles, right more than left.  Skin: Warm and dry without trophic changes noted. There are no varicose veins.  Musculoskeletal: exam reveals no obvious joint deformities, tenderness or joint swelling or erythema.   Neurologically:  Mental status: The patient is awake, alert and oriented in all 4 spheres. Her immediate and remote memory, attention, language skills and fund of  knowledge are Mildly impaired. There is no evidence of aphasia, agnosia, apraxia or anomia. Speech is clear with normal prosody and enunciation. Thought process is linear. Mood is normal and affect is normal.   On 07/09/2017: MMSE: 27/30, CDT: 4/4, AFT: 9/min.   Cranial nerves II - XII are as described above under HEENT exam. In addition: shoulder shrug is normal with equal  shoulder height noted. Motor exam: Normal bulk, strength and tone is noted. There is no drift, tremor or rebound. Romberg is not tested for safety. Reflexes are 1+ throughout, but absent in the ankles. Babinski: Toes are flexor bilaterally. Fine motor skills and coordination: intact with normal finger taps, normal hand movements, normal rapid alternating patting, normal foot taps and normal foot agility.  Cerebellar testing: No dysmetria or intention tremor on finger to nose testing. Heel to shin is unremarkable bilaterally. There is no truncal or gait ataxia.  Sensory exam: intact to light touch in the upper and lower extremities.  Gait, station and balance: She stands without significant difficulty, no limp, walking is age-appropriate.  Assessment and Plan:   In summary, [name] is a very pleasant 77 y.o.-year old female with an underlying complex medical history of diabetes, asthma, reflux disease, hypertension, hyperlipidemia, kidney cancer with status post left nephrectomy, sleep apnea, vertigo, chronic kidney disease, arthritis and obesity, who for initial consultation of her memory loss of at least one years duration. She has on examination mild memory loss, has certainly multiple risk factors for vascular dementia. She is already on donepezil 5 mg strength daily per primary care physician. She is advised to continue with her current medication regimen. We will proceed with further testing in the form of brain MRI without contrast and sleep study for reevaluation of her prior diagnosis of obstructive sleep apnea. She is  advised that having sleep apnea as a independent risk factor for vascular disease including cardiac complications as well as dementia and stroke. She would be willing to try CPAP again but had difficulty in the past using what sounds like a full facemask. She is cautioned about driving as she has had some difficulty with directions at times. She has given up long-distance driving. She is planning a trip to [place] to attend graduation and will be taking the train. Would like to see her back after testing is completed. I answered all her questions today and she was in agreement.  Thank you very much for allowing me to participate in the care of this nice patient. If I can be of any further assistance to you please do not hesitate to call me at [phone].  Sincerely,   Star Age, MD, PhD

## 2017-07-09 NOTE — Telephone Encounter (Signed)
[employer] Medicare order sent to GI. No auth they will reach out to the pt to schedule.  °

## 2017-07-09 NOTE — Patient Instructions (Signed)
Your memory loss is mild.  I am worried about your driving, please have your family monitor it and I would suggest at this point only local roads, familiar routes, no nighttime and no highway driving.  We will do further testing: We will do a brain scan, called MRI and call you with the test results. We will have to schedule you for this on a separate date. This test requires authorization from your insurance, and we will take care of the insurance process. We will do sleep study testing to re-evaluate you for sleep apnea.   Please remember, the long-term risks and ramifications of untreated moderate to severe obstructive sleep apnea are: increased Cardiovascular disease, including congestive heart failure, stroke, difficult to control hypertension, treatment resistant obesity, arrhythmias, especially irregular heartbeat commonly known as A. Fib. (atrial fibrillation); even type 2 diabetes has been linked to untreated OSA.   Sleep apnea can cause disruption of sleep and sleep deprivation in most cases, which, in turn, can cause recurrent headaches, problems with memory, mood, concentration, focus, and vigilance. Most people with untreated sleep apnea report excessive daytime sleepiness, which can affect their ability to drive. Please do not drive if you feel sleepy. Patients with sleep apnea developed difficulty initiating and maintaining sleep (aka insomnia).   Having sleep apnea may increase your risk for other sleep disorders, including involuntary behaviors sleep such as sleep terrors, sleep talking, sleepwalking.    Having sleep apnea can also increase your risk for restless leg syndrome and leg movements at night.   Please note that untreated obstructive sleep apnea may carry additional perioperative morbidity. Patients with significant obstructive sleep apnea (typically, in the moderate to severe degree) should receive, if possible, perioperative PAP (positive airway pressure) therapy and the  surgeons and particularly the anesthesiologists should be informed of the diagnosis and the severity of the sleep disordered breathing.   I will likely see you back after your sleep study to go over the test results and where to go from there. We will call you after your sleep study to advise about the results (most likely, you will hear from Dewey, my nurse) and to set up an appointment at the time, as necessary.    Our sleep lab administrative assistant will call you to schedule your sleep study and give you further instructions, regarding the check in process for the sleep study, arrival time, what to bring, when you can expect to leave after the study, etc., and to answer any other logistical questions you may have. If you don't hear back from her by about 2 weeks from now, please feel free to call her direct line at [phone] or you can call our general clinic number, or email [place] through My Chart.

## 2017-07-16 ENCOUNTER — Telehealth: Payer: Self-pay

## 2017-07-16 NOTE — Telephone Encounter (Signed)
We have attempted to call the patient two times to schedule sleep study.  Patient has been unavailable at the phone numbers we have on file and has not returned our calls.  At this point we will send a letter asking patient to please contact the sleep lab to schedule their sleep study.  If patient calls back we will schedule them for their sleep study. 

## 2017-07-22 ENCOUNTER — Ambulatory Visit
Admission: RE | Admit: 2017-07-22 | Discharge: 2017-07-22 | Disposition: A | Discharge status: Home / Self Care | Payer: Medicare Other | Source: Ambulatory Visit | Attending: Neurology | Admitting: Neurology

## 2017-07-22 DIAGNOSIS — E669 Obesity, unspecified: Secondary | ICD-10-CM

## 2017-07-22 DIAGNOSIS — Z789 Other specified health status: Secondary | ICD-10-CM

## 2017-07-22 DIAGNOSIS — G4733 Obstructive sleep apnea (adult) (pediatric): Secondary | ICD-10-CM

## 2017-07-22 DIAGNOSIS — R413 Other amnesia: Secondary | ICD-10-CM

## 2017-07-22 DIAGNOSIS — N189 Chronic kidney disease, unspecified: Secondary | ICD-10-CM

## 2017-07-23 ENCOUNTER — Telehealth: Payer: Self-pay

## 2017-07-23 NOTE — Progress Notes (Signed)
MRI brain wo contrast showed age appropriate findings. No acute findings.  Please advise pt or caregiver. No further action is required on this test at this time, other than re-enforcing the importance of good blood pressure control, good cholesterol control, good blood sugar control, and weight management. Please remind patient to keep any upcoming appointments or tests and to call us with any interim questions, concerns, problems or updates.  I had suggested a sleep study for re-eval of her OSA. That has not been scheduled it seems, pls inquire.  Michel Bickers

## 2017-07-23 NOTE — Telephone Encounter (Signed)
I called pt and explained her MRI results. I reminded her to keep good BP, cholesterol level, blood sugar level, and weight. Pt verbalized understanding.  Pt is agreeable to scheduling her sleep study and is asking that the sleep lab call her back.

## 2017-07-23 NOTE — Telephone Encounter (Signed)
-----   Message from Star Age, MD sent at 07/23/2017 10:39 AM EDT ----- MRI brain wo contrast showed age appropriate findings. No acute findings.  Please advise pt or caregiver. No further action is required on this test at this time, other than re-enforcing the importance of good blood pressure control, good cholesterol control, good blood sugar control, and weight management. Please remind patient to keep any upcoming appointments or tests and to call us with any interim questions, concerns, problems or updates.  I had suggested a sleep study for re-eval of her OSA. That has not been scheduled it seems, pls inquire.  Michel Bickers

## 2017-07-29 ENCOUNTER — Ambulatory Visit (INDEPENDENT_AMBULATORY_CARE_PROVIDER_SITE_OTHER): Payer: Medicare Other | Admitting: Neurology

## 2017-07-29 DIAGNOSIS — Z789 Other specified health status: Secondary | ICD-10-CM

## 2017-07-29 DIAGNOSIS — G4733 Obstructive sleep apnea (adult) (pediatric): Secondary | ICD-10-CM

## 2017-07-29 DIAGNOSIS — E669 Obesity, unspecified: Secondary | ICD-10-CM

## 2017-07-29 DIAGNOSIS — R413 Other amnesia: Secondary | ICD-10-CM

## 2017-07-29 DIAGNOSIS — N189 Chronic kidney disease, unspecified: Secondary | ICD-10-CM

## 2017-07-31 ENCOUNTER — Telehealth: Payer: Self-pay

## 2017-07-31 NOTE — Addendum Note (Signed)
Addended by: Star Age on: 07/31/2017 07:45 AM   Modules accepted: Orders

## 2017-07-31 NOTE — Progress Notes (Signed)
Patient referred by her nephrologist for memory loss, seen by me on 07/09/17, diagnostic PSG on 07/29/17, for re-eval of her OSA.   Please call and notify the patient that the recent sleep study showed mild obstructive sleep apnea with a total AHI of 5.6/hour, REM AHI of 7.2/hour, and O2 nadir of 86%. Given the patient's complex medical history and mild memory loss, treatment with positive airway pressure should be considered. I recommend treatment for this in the form of CPAP. This will require a repeat sleep study for proper titration and mask fitting and correct monitoring of the oxygen saturations and better tolerance hopefully this time around with CPAP. Please explain to patient. I have placed an order in the chart. Thanks.  Star Age, MD, PhD [facility])

## 2017-07-31 NOTE — Telephone Encounter (Signed)
I called pt to discuss her sleep study results again. No answer, VM not set up.

## 2017-07-31 NOTE — Telephone Encounter (Signed)
-----   Message from Star Age, MD sent at 07/31/2017  7:45 AM EDT ----- Patient referred by her nephrologist for memory loss, seen by me on 07/09/17, diagnostic PSG on 07/29/17, for re-eval of her OSA.   Please call and notify the patient that the recent sleep study showed mild obstructive sleep apnea with a total AHI of 5.6/hour, REM AHI of 7.2/hour, and O2 nadir of 86%. Given the patient's complex medical history and mild memory loss, treatment with positive airway pressure should be considered. I recommend treatment for this in the form of CPAP. This will require a repeat sleep study for proper titration and mask fitting and correct monitoring of the oxygen saturations and better tolerance hopefully this time around with CPAP. Please explain to patient. I have placed an order in the chart. Thanks.  Star Age, MD, PhD [facility])

## 2017-07-31 NOTE — Procedures (Signed)
PATIENT'S NAME:  [name], Schomer DOB:      09-01-1940      MR#:    [id number]     DATE OF RECORDING: 07/29/2017 REFERRING M.D.:  Donato Heinz, MD Study Performed:   Baseline Polysomnogram HISTORY: 77 year old woman with a history of diabetes, asthma, reflux disease, hypertension, hyperlipidemia, kidney cancer with status post left nephrectomy, sleep apnea, vertigo, chronic kidney disease, arthritis and obesity, who reports short-term memory loss. She presents for re-evaluation of her OSA. The patient's weight 155 pounds with a height of 59 (inches), resulting in a BMI of 31.1 kg/m2. The patient's neck circumference measured 14 inches.  CURRENT MEDICATIONS: Proventil, Norvasc, Aspirin, Lipitor, Aricept, Neurontin, Lopressor, Myrebetriq, Nitrostat, Zofran, Tradjenta   PROCEDURE:  This is a multichannel digital polysomnogram utilizing the Somnostar 11.2 system.  Electrodes and sensors were applied and monitored per AASM Specifications.   EEG, EOG, Chin and Limb EMG, were sampled at 200 Hz.  ECG, Snore and Nasal Pressure, Thermal Airflow, Respiratory Effort, CPAP Flow and Pressure, Oximetry was sampled at 50 Hz. Digital video and audio were recorded.      BASELINE STUDY  Lights Out was at 21:09 and Lights On at 05:00.  Total recording time (TRT) was 471 minutes, with a total sleep time (TST) of  364 minutes.   The patient's sleep latency was 30.5 minutes, which is delayed, REM latency was 129.5 minutes, which is mildly delayed. The sleep efficiency was 77.3 %.     SLEEP ARCHITECTURE: WASO (Wake after sleep onset) was 72 minutes with mild sleep fragmentation noted.  There were 31.5 minutes in Stage N1, 181 minutes Stage N2, 76.5 minutes Stage N3 and 75 minutes in Stage REM.  The percentage of Stage N1 was 8.7%, Stage N2 was 49.7%, which is normal, Stage N3 was 21%, which is normal, and Stage R (REM sleep) was 20.6%, which is normal. The arousals were noted as: 45 were spontaneous, 0 were associated with  PLMs, 2 were associated with respiratory events.  RESPIRATORY ANALYSIS:  There were a total of 34 respiratory events:  0 obstructive apneas, 5 central apneas and 2 mixed apneas with a total of 7 apneas and an apnea index (AI) of 1.2 /hour. There were 27 hypopneas with a hypopnea index of 4.5 /hour. The patient also had 0 respiratory event related arousals (RERAs).      The total APNEA/HYPOPNEA INDEX (AHI) was 5.6/hour and the total RESPIRATORY DISTURBANCE INDEX was 0. 5.6 /hour.  9 events occurred in REM sleep and 44 events in NREM. The REM AHI was 7.2/hour, versus a non-REM AHI of 5.2. The patient spent 125.5 minutes of total sleep time in the supine position and 239 minutes in non-supine.. The supine AHI was 2.9 versus a non-supine AHI of 7.1.  OXYGEN SATURATION & C02:  The Wake baseline 02 saturation was 94%, with the lowest being 86%. Time spent below 89% saturation equaled 0 minutes.  PERIODIC LIMB MOVEMENTS: The patient had a total of 0 Periodic Limb Movements.  The Periodic Limb Movement (PLM) index was 0 and the PLM Arousal index was 0/hour.  Audio and video analysis did not show any abnormal or unusual movements, behaviors, phonations or vocalizations. The patient took 1 bathroom break. Mild to moderate and rare louder snoring was noted. The EKG was in keeping with normal sinus rhythm (NSR).  IMPRESSION:  1. Obstructive Sleep Apnea (OSA)  RECOMMENDATIONS:  1. This study demonstrates overall mild obstructive sleep apnea, with a total AHI of 5.6/hour, REM  AHI of 7.2/hour, and O2 nadir of 86%. Given the patient's medical history and sleep related complaints, treatment with positive airway pressure should be considered. A full-night CPAP titration study is recommended to optimize therapy and help with tolerance, especially as the patient had difficulty tolerating CPAP in the past. Other treatment options may include avoidance of supine sleep position along with weight loss, upper airway or jaw  surgery in selected patients or the use of an oral appliance in certain patients. ENT evaluation and/or consultation with a maxillofacial surgeon or dentist may be feasible in some instances.    2. The patient should be cautioned not to drive, work at heights, or operate dangerous or heavy equipment when tired or sleepy. Review and reiteration of good sleep hygiene measures should be pursued with any patient. 3. The patient will be seen in follow-up in the sleep clinic at [facility] for discussion of the test results, symptom and treatment compliance review, further management strategies, etc. The referring provider will be notified of the test results.  I certify that I have reviewed the entire raw data recording prior to the issuance of this report in accordance with the Standards of Accreditation of the American Academy of Sleep Medicine (AASM)   Star Age, MD, PhD Diplomat, American Board of Neurology and Sleep Medicine (Neurology and Sleep Medicine)

## 2017-07-31 NOTE — Telephone Encounter (Signed)
I called pt to discuss her sleep study results. No answer, no VM set up, will try again later.

## 2017-08-04 NOTE — Telephone Encounter (Signed)
I called pt. I advised pt that Dr. Rexene Alberts reviewed their sleep study results and found that pt has mild osa and recommends that pt be treated with a cpap. Dr. Rexene Alberts recommends that pt return for a repeat sleep study in order to properly titrate the cpap and ensure a good mask fit. Pt is agreeable to returning for a titration study. I advised pt that our sleep lab will file with pt's insurance and call pt to schedule the sleep study when we hear back from the pt's insurance regarding coverage of this sleep study. Pt verbalized understanding of results. Pt had no questions at this time but was encouraged to call back if questions arise.

## 2017-08-26 ENCOUNTER — Ambulatory Visit (INDEPENDENT_AMBULATORY_CARE_PROVIDER_SITE_OTHER): Payer: Medicare Other | Admitting: Neurology

## 2017-08-26 DIAGNOSIS — G4720 Circadian rhythm sleep disorder, unspecified type: Secondary | ICD-10-CM

## 2017-08-26 DIAGNOSIS — G4733 Obstructive sleep apnea (adult) (pediatric): Secondary | ICD-10-CM

## 2017-08-26 DIAGNOSIS — R413 Other amnesia: Secondary | ICD-10-CM

## 2017-08-26 DIAGNOSIS — E669 Obesity, unspecified: Secondary | ICD-10-CM

## 2017-08-26 DIAGNOSIS — N189 Chronic kidney disease, unspecified: Secondary | ICD-10-CM

## 2017-08-26 DIAGNOSIS — Z789 Other specified health status: Secondary | ICD-10-CM

## 2017-09-09 ENCOUNTER — Telehealth: Payer: Self-pay

## 2017-09-09 NOTE — Procedures (Signed)
PATIENT'S NAME:  [name], [name] DOB:      03-11-1940      MR#:    [id number]     DATE OF RECORDING: 08/26/2017 REFERRING M.D.:  Donato Heinz, MD Study Performed:   CPAP  Titration HISTORY:  77 year old right-handed woman with an underlying complex medical history of diabetes, asthma, reflux disease, hypertension, hyperlipidemia, kidney cancer with status post left nephrectomy, sleep apnea, vertigo, chronic kidney disease, arthritis and obesity, who presents for a full night CPAP titration study. Her baseline sleep study from 07/29/17 showed a total AHI of 5.6/h and low spo2 of 86%. BMI of 31.1 kg/m2. The patient's neck circumference measured 14 inches.  CURRENT MEDICATIONS: Proventil, Norvasc, Aspirin, Lipitor, Aricept, Neurontin, Lopressor, Myrebetriq, Nitrostat, Zofran, Tradjenta  PROCEDURE:  This is a multichannel digital polysomnogram utilizing the SomnoStar 11.2 system.  Electrodes and sensors were applied and monitored per AASM Specifications.   EEG, EOG, Chin and Limb EMG, were sampled at 200 Hz.  ECG, Snore and Nasal Pressure, Thermal Airflow, Respiratory Effort, CPAP Flow and Pressure, Oximetry was sampled at 50 Hz. Digital video and audio were recorded.      The patient was fitted with small P10 nasal pillows. CPAP was initiated at 5 cmH20 with heated humidity per AASM standards and pressure was advanced to 8 cmH20 because of hypopneas, apneas and desaturations.  At a PAP pressure of 8 cmH20, there was a reduction of the AHI to 0/hour with non-supine NREM sleep achieved and O2 nadir of 93%.  Lights Out was at 21:20 and Lights On at 05:00. Total recording time (TRT) was 461 minutes, with a total sleep time (TST) of 156.5 minutes. The patient's sleep latency was 221.5 minutes, which is severely delayed. REM latency was 87 minutes.  The sleep efficiency was 33.9%, which is severely reduced.    SLEEP ARCHITECTURE: WASO (Wake after sleep onset)  was 30.5 minutes.  There were 8.5 minutes in Stage  N1, 47.5 minutes Stage N2, 80.5 minutes Stage N3 and 20 minutes in Stage REM.  The percentage of Stage N1 was 5.4%, Stage N2 was 30.4%, Stage N3 was 51.4%, which is increased, and Stage R (REM sleep) was 12.8%, which is reduced. The arousals were noted as: 10 were spontaneous, 0 were associated with PLMs, 0 were associated with respiratory events.  RESPIRATORY ANALYSIS:  There was a total of 0 respiratory events: 0 obstructive apneas, 0 central apneas and 0 mixed apneas with a total of 0 apneas and an apnea index (AI) of 0 /hour. There were 0 hypopneas with a hypopnea index of 0/hour. The patient also had 0 respiratory event related arousals (RERAs).      The total APNEA/HYPOPNEA INDEX  (AHI) was 00. /hour and the total RESPIRATORY DISTURBANCE INDEX was 0 0./hour  0 events occurred in REM sleep and 0 events in NREM. The REM AHI was 0 /hour versus a non-REM AHI of 0 0./hour.  The patient spent 0 minutes of total sleep time in the supine position and 157 minutes in non-supine. The supine AHI was 0.0, versus a non-supine AHI of 0.0.  OXYGEN SATURATION & C02:  The baseline 02 saturation was 97%, with the lowest being 83% (71% on the technical report was error). Time spent below 89% saturation equaled 0 minutes.  PERIODIC LIMB MOVEMENTS:  The patient had a total of 6 Periodic Limb Movements. The Periodic Limb Movement (PLM) index was 2.3 and the PLM Arousal index was 0 /hour.  Audio and video analysis did not  show any abnormal or unusual movements, behaviors, phonations or vocalizations. The patient took 2 bathroom breaks. The EKG was in keeping with normal sinus rhythm (NSR).  Post-study, the patient indicated that sleep was worse than usual.   IMPRESSION:   1. Obstructive Sleep Apnea (OSA)  2. Dysfunctions associated with sleep stages or arousal from sleep   RECOMMENDATIONS:   1. This study demonstrates resolution of the patient's obstructive sleep apnea with CPAP therapy. The study was limited by  poor sleep efficiency. I will recommend home CPAP treatment at a pressure of 8 cm via small nasal pillows with heated humidity. The patient should be reminded to be fully compliant with PAP therapy to improve sleep related symptoms and decrease long term cardiovascular risks. The patient should be reminded, that it may take up to 3 months to get fully used to using PAP with all planned sleep. The earlier full compliance is achieved, the better long term compliance tends to be. Please note that untreated obstructive sleep apnea may carry additional perioperative morbidity. Patients with significant obstructive sleep apnea should receive perioperative PAP therapy and the surgeons and particularly the anesthesiologist should be informed of the diagnosis and the severity of the sleep disordered breathing.  2. This study shows sleep fragmentation and abnormal sleep stage percentages; these are nonspecific findings and per se do not signify an intrinsic sleep disorder or a cause for the patient's sleep-related symptoms. Causes include (but are not limited to) the first night effect of the sleep study, circadian rhythm disturbances, medication effect or an underlying mood disorder or medical problem.  3. The patient should be cautioned not to drive, work at heights, or operate dangerous or heavy equipment when tired or sleepy. Review and reiteration of good sleep hygiene measures should be pursued with any patient. 4. The patient will be seen in follow-up in the sleep clinic at [facility] for discussion of the test results, symptom and treatment compliance review, further management strategies, etc. The referring provider will be notified of the test results.   I certify that I have reviewed the entire raw data recording prior to the issuance of this report in accordance with the Standards of Accreditation of the American Academy of Sleep Medicine (AASM)     Star Age, MD, PhD Diplomat, American Board of Neurology and  Sleep Medicine (Neurology and Sleep Medicine)

## 2017-09-09 NOTE — Progress Notes (Signed)
Patient referred by her nephrologist for memory loss, seen by me on 07/09/17, diagnostic PSG on 07/29/17, for re-eval of her OSA. Patient had a CPAP titration study on 08/26/17.  Please call and inform patient that I have entered an order for treatment with positive airway pressure (PAP) treatment for obstructive sleep apnea (OSA). She did fairly during the latest sleep study with CPAP, but did not sleep very much at all, achieved all stages of sleep. We will, therefore, arrange for a machine for home use through a DME (durable medical equipment) company of Her choice; and I will see the patient back in follow-up in about 10 weeks. Please also explain to the patient that I will be looking out for compliance data, which can be downloaded from the machine (stored on an SD card, that is inserted in the machine) or via remote access through a modem, that is built into the machine. At the time of the followup appointment we will discuss sleep study results and how it is going with PAP treatment at home. Please advise patient to bring Her machine at the time of the first FU visit, even though this is cumbersome. Bringing the machine for every visit after that will likely not be needed, but often helps for the first visit to troubleshoot if needed. Please re-enforce the importance of compliance with treatment and the need for [place] to monitor compliance data - often an insurance requirement and actually good feedback for the patient as far as how they are doing.  Also remind patient, that any interim PAP machine or mask issues should be first addressed with the DME company, as they can often help better with technical and mask fit issues. Please ask if patient has a preference regarding DME company.  Please also make sure, the patient has a follow-up appointment with me in about 10 weeks from the setup date, thanks. May see one of our nurse practitioners if needed for proper timing of the FU appointment.  Please fax or rout  report to the referring provider. Thanks,   Star Age, MD, PhD [facility])

## 2017-09-09 NOTE — Addendum Note (Signed)
Addended by: Star Age on: 09/09/2017 07:47 AM   Modules accepted: Orders

## 2017-09-09 NOTE — Telephone Encounter (Signed)
I called pt to discuss her sleep study results. No answer, left a message asking her to call me back. 

## 2017-09-09 NOTE — Telephone Encounter (Signed)
-----   Message from Star Age, MD sent at 09/09/2017  7:47 AM EDT ----- Patient referred by her nephrologist for memory loss, seen by me on 07/09/17, diagnostic PSG on 07/29/17, for re-eval of her OSA. Patient had a CPAP titration study on 08/26/17.  Please call and inform patient that I have entered an order for treatment with positive airway pressure (PAP) treatment for obstructive sleep apnea (OSA). She did fairly during the latest sleep study with CPAP, but did not sleep very much at all, achieved all stages of sleep. We will, therefore, arrange for a machine for home use through a DME (durable medical equipment) company of Her choice; and I will see the patient back in follow-up in about 10 weeks. Please also explain to the patient that I will be looking out for compliance data, which can be downloaded from the machine (stored on an SD card, that is inserted in the machine) or via remote access through a modem, that is built into the machine. At the time of the followup appointment we will discuss sleep study results and how it is going with PAP treatment at home. Please advise patient to bring Her machine at the time of the first FU visit, even though this is cumbersome. Bringing the machine for every visit after that will likely not be needed, but often helps for the first visit to troubleshoot if needed. Please re-enforce the importance of compliance with treatment and the need for [place] to monitor compliance data - often an insurance requirement and actually good feedback for the patient as far as how they are doing.  Also remind patient, that any interim PAP machine or mask issues should be first addressed with the DME company, as they can often help better with technical and mask fit issues. Please ask if patient has a preference regarding DME company.  Please also make sure, the patient has a follow-up appointment with me in about 10 weeks from the setup date, thanks. May see one of our nurse  practitioners if needed for proper timing of the FU appointment.  Please fax or rout report to the referring provider. Thanks,   Star Age, MD, PhD [facility])

## 2017-09-11 NOTE — Telephone Encounter (Signed)
I called pt. I advised pt that Dr. Rexene Alberts reviewed their sleep study results and found that pt did fairly well with the cpap, but did not sleep very much at all, but did achieve all stages of sleep. Dr. Rexene Alberts recommends that pt start a cpap at home. I reviewed PAP compliance expectations with the pt. Pt is agreeable to starting a CPAP. I advised pt that an order will be sent to a DME, [facility], and [facility] will call the pt within about one week after they file with the pt's insurance. [facility] will show the pt how to use the machine, fit for masks, and troubleshoot the CPAP if needed. A follow up appt was made for insurance purposes with Dr. Rexene Alberts on 12/17/2017 at 1:00pm. Pt verbalized understanding to arrive 15 minutes early and bring their CPAP. A letter with all of this information in it will be mailed to the pt as a reminder. I verified with the pt that the address we have on file is correct. Pt verbalized understanding of results. Pt had no questions at this time but was encouraged to call back if questions arise.

## 2017-10-03 ENCOUNTER — Other Ambulatory Visit: Payer: Self-pay | Admitting: Family Medicine

## 2017-10-03 DIAGNOSIS — R109 Unspecified abdominal pain: Secondary | ICD-10-CM

## 2017-10-09 ENCOUNTER — Ambulatory Visit
Admission: RE | Admit: 2017-10-09 | Discharge: 2017-10-09 | Disposition: A | Discharge status: Home / Self Care | Payer: Medicare Other | Source: Ambulatory Visit | Attending: Family Medicine | Admitting: Family Medicine

## 2017-10-09 DIAGNOSIS — R109 Unspecified abdominal pain: Secondary | ICD-10-CM

## 2017-11-25 IMAGING — CT CT ABD-PELV W/ CM
2 of 5 series · 13 of 46 positions shown, 15 images · IV contrast (Iodine)
Comparison: CT the abdomen and pelvis 07/06/2013.

CLINICAL DATA: 74-year-old female with diffuse abdominal pain,
weight loss and intractable vomiting.

EXAM:
CT ABDOMEN AND PELVIS WITH CONTRAST
TECHNIQUE: Multidetector CT imaging of the abdomen and pelvis was performed
using the standard protocol following bolus administration of
intravenous contrast.
CONTRAST:  80mL OMNIPAQUE IOHEXOL 300 MG/ML  SOLN

[Series 201: routine, idose (2) · axial · 0.59mm/px · z∈[-308,+32]mm · 10 of 78 slices shown, 12 images]
[im 5/78  soft-tissue]
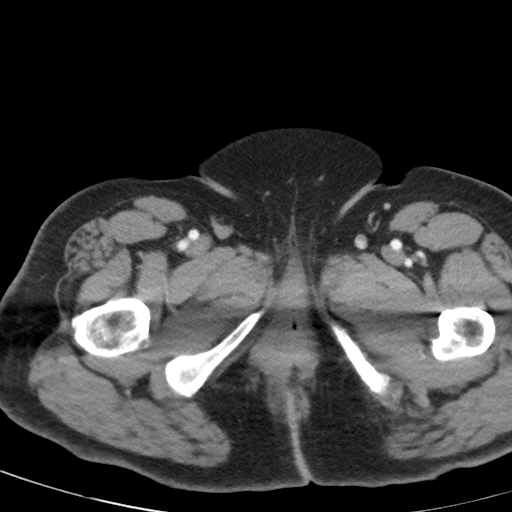
[im 5/78  bone]
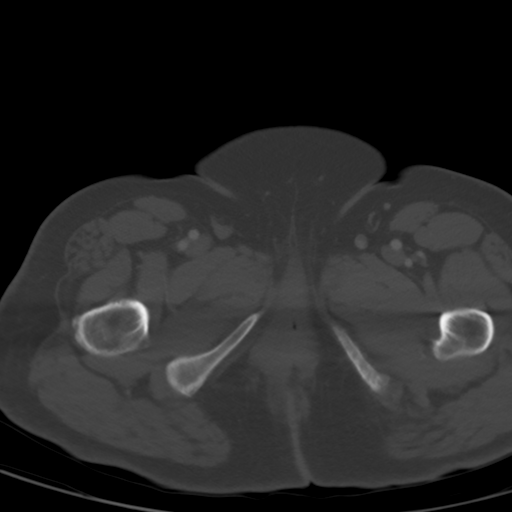
[im 13/78  soft-tissue]
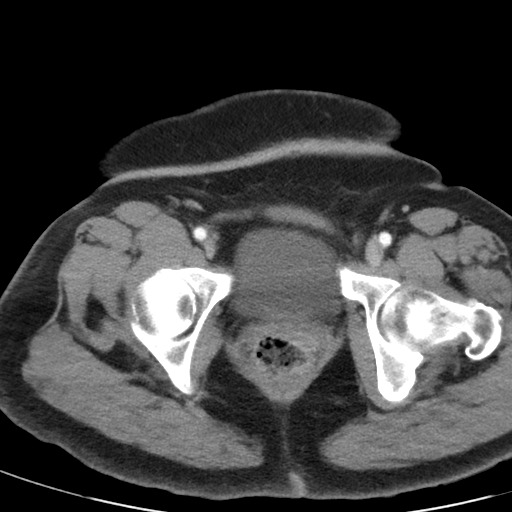
[im 22/78  soft-tissue]
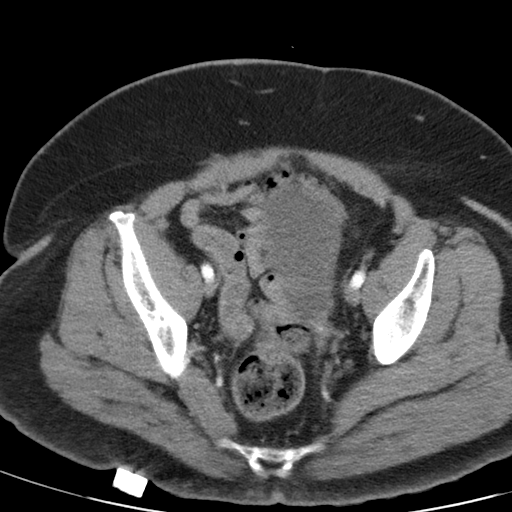
[im 26/78  soft-tissue]
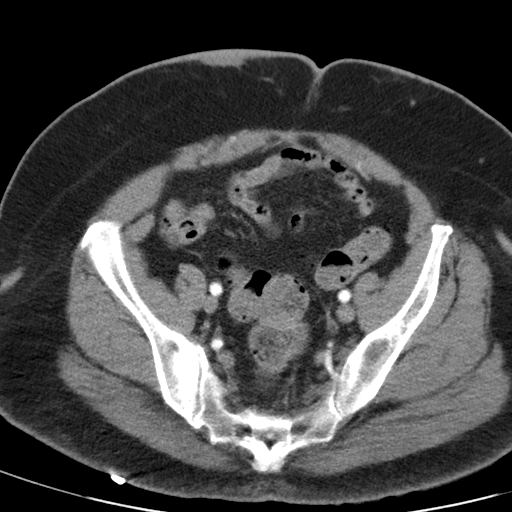
[im 35/78  soft-tissue]
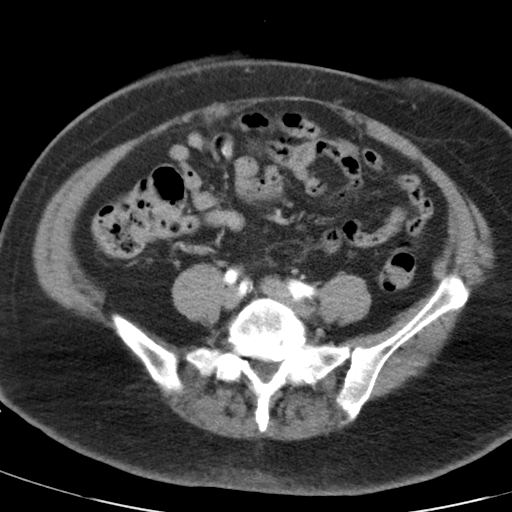
[im 43/78  soft-tissue]
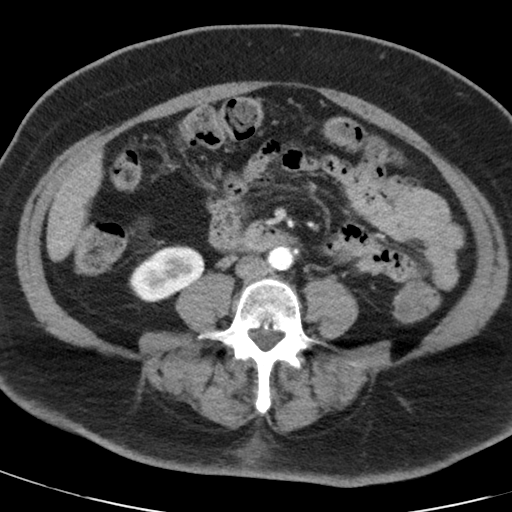
[im 52/78  soft-tissue]
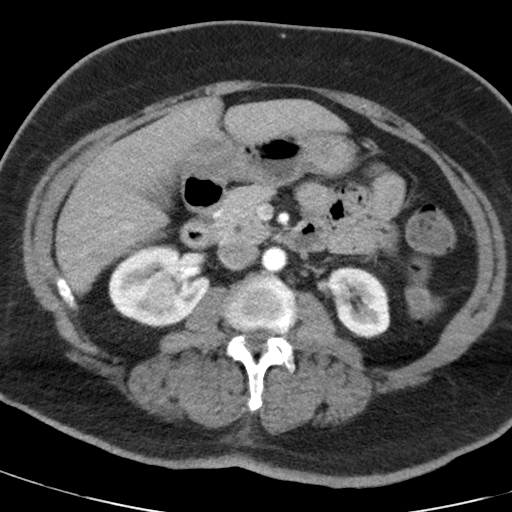
[im 56/78  soft-tissue]
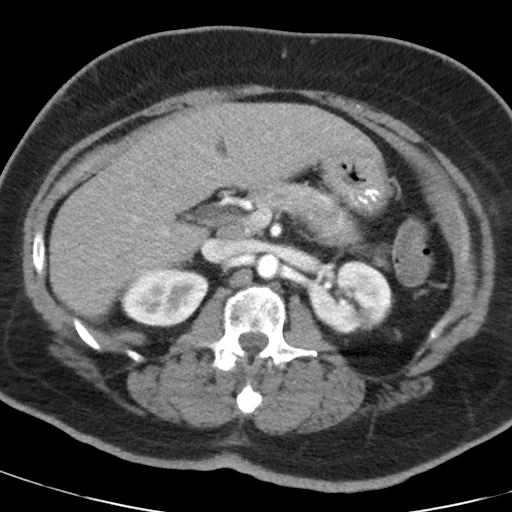
[im 65/78  soft-tissue]
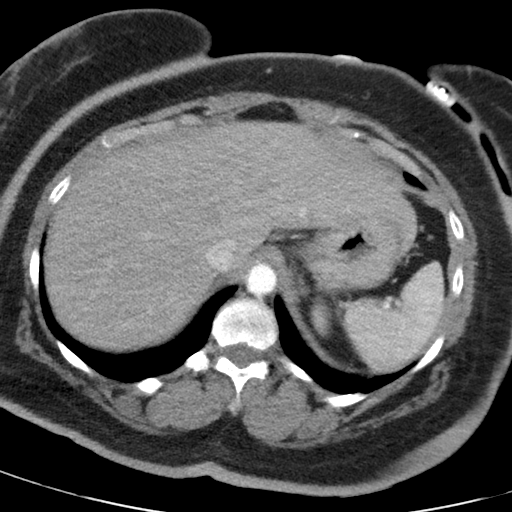
[im 65/78  bone]
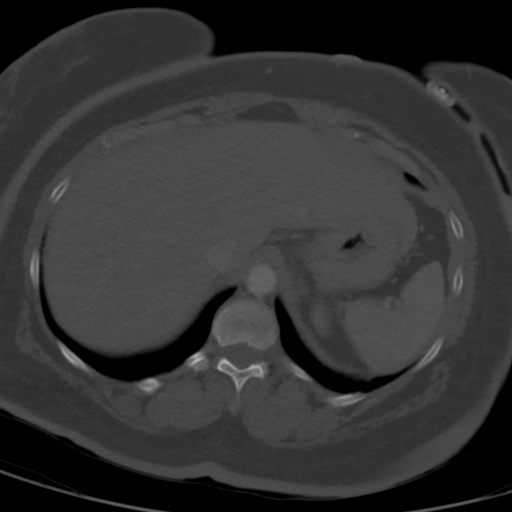
[im 73/78  soft-tissue]
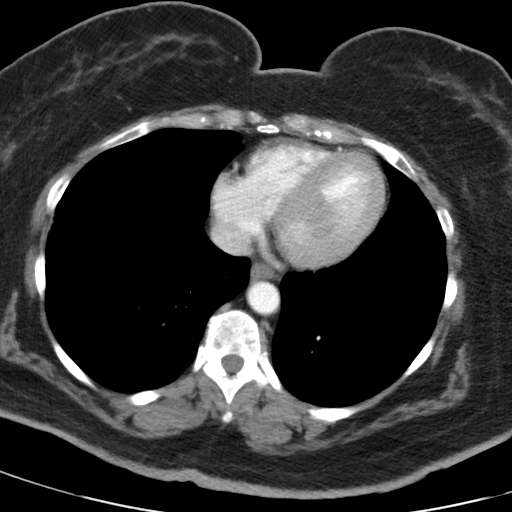

[Series 203: coronals, idose (2) · coronal · 0.45mm/px · 3 of 120 slices shown]
[im 40/120  soft-tissue]
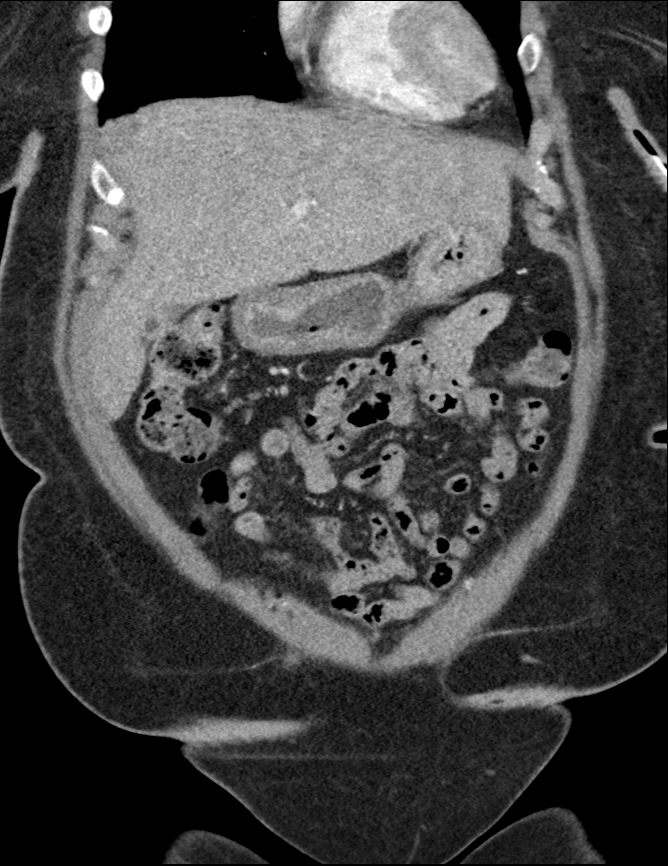
[im 53/120  soft-tissue]
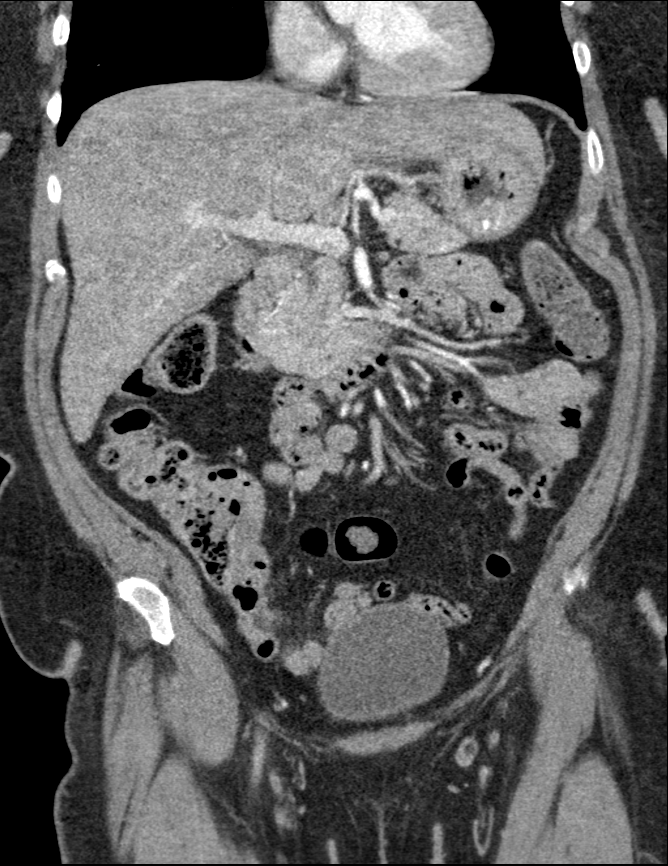
[im 67/120  soft-tissue]
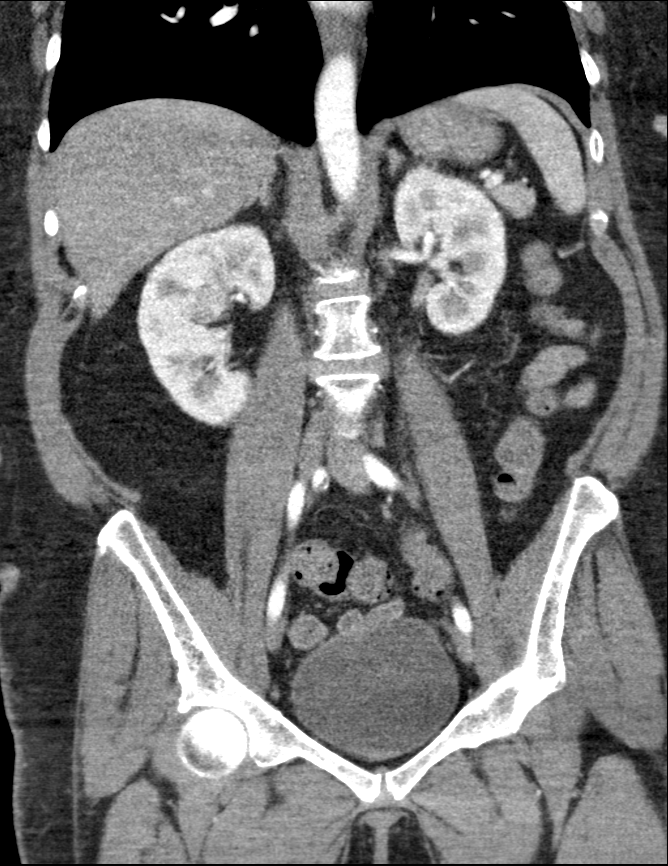

[13 of 46 positions shown; findings below may reference images not displayed]

FINDINGS: Lower chest:  Unremarkable.

Hepatobiliary: No cystic or solid hepatic lesions. No intra or
extrahepatic biliary ductal dilatation. Gallbladder is nearly
completely decompressed, but otherwise unremarkable in appearance.

Pancreas: No pancreatic mass. No pancreatic ductal dilatation. No
pancreatic or peripancreatic fluid or inflammatory changes.

Spleen: Unremarkable.

Adrenals/Urinary Tract: Postoperative changes of partial nephrectomy
are noted in the posterior interpolar region of the left kidney. No
evidence of recurrent mass in this region. However, in the
peripelvic aspect of the interpolar region of the right kidney
(axial image 27 of series 201 and coronal image 69 of series 203)
there is an enlarging 1.9 x 1.6 x 2.3 cm enhancing lesion (128 HU on
portal venous phase decreasing to 84 HU on delayed phase), highly
concerning for renal neoplasm. This lesion appears completely
separate from the right renal vein. No hydroureteronephrosis.
Urinary bladder is normal in appearance. Bilateral adrenal glands
are normal in appearance.

Stomach/Bowel: The appearance of the stomach is normal. No
pathologic dilatation of small bowel or colon. The appendix is not
confidently identified and is likely surgically absent. Regardless,
no inflammatory changes are noted adjacent to the cecum to suggest
presence of an acute appendicitis at this time.

Vascular/Lymphatic: Minimal atherosclerotic disease noted throughout
the abdominal and pelvic vasculature, without evidence of aneurysm
or dissection. No lymphadenopathy noted in the abdomen or pelvis.

Reproductive: Status post hysterectomy.  Ovaries are atrophic.

Other: No significant volume of ascites.  No pneumoperitoneum.

Musculoskeletal: There are no aggressive appearing lytic or blastic
lesions noted in the visualized portions of the skeleton. Advanced
discogenic changes involving the superior aspect of the T10
vertebral body, similar to prior examinations.
IMPRESSION: 1. No acute findings in the abdomen or pelvis to account for the
patient's symptoms.
2. However, there is an enlarging enhancing lesion in the peripelvic
aspect of the interpolar region of the right kidney currently
measuring 1.9 x 1.6 x 2.3 cm, highly concerning for renal neoplasm.
Nonemergent outpatient referral to Urology for further evaluation is
strongly recommended at this time.
These results were called by telephone at the time of interpretation
on 01/14/2015 at [DATE] to Dr. SASHO TUSCHE, who verbally
acknowledged these results.

## 2017-11-25 IMAGING — CR DG CHEST 2V
2 series · 2 of 2 positions shown · non-contrast
Comparison: 01/26/2014

CLINICAL DATA: Cough, fatigue, and dizziness for 2 months. Asthma.
Renal failure.

EXAM:
CHEST  2 VIEW

[chest pa]
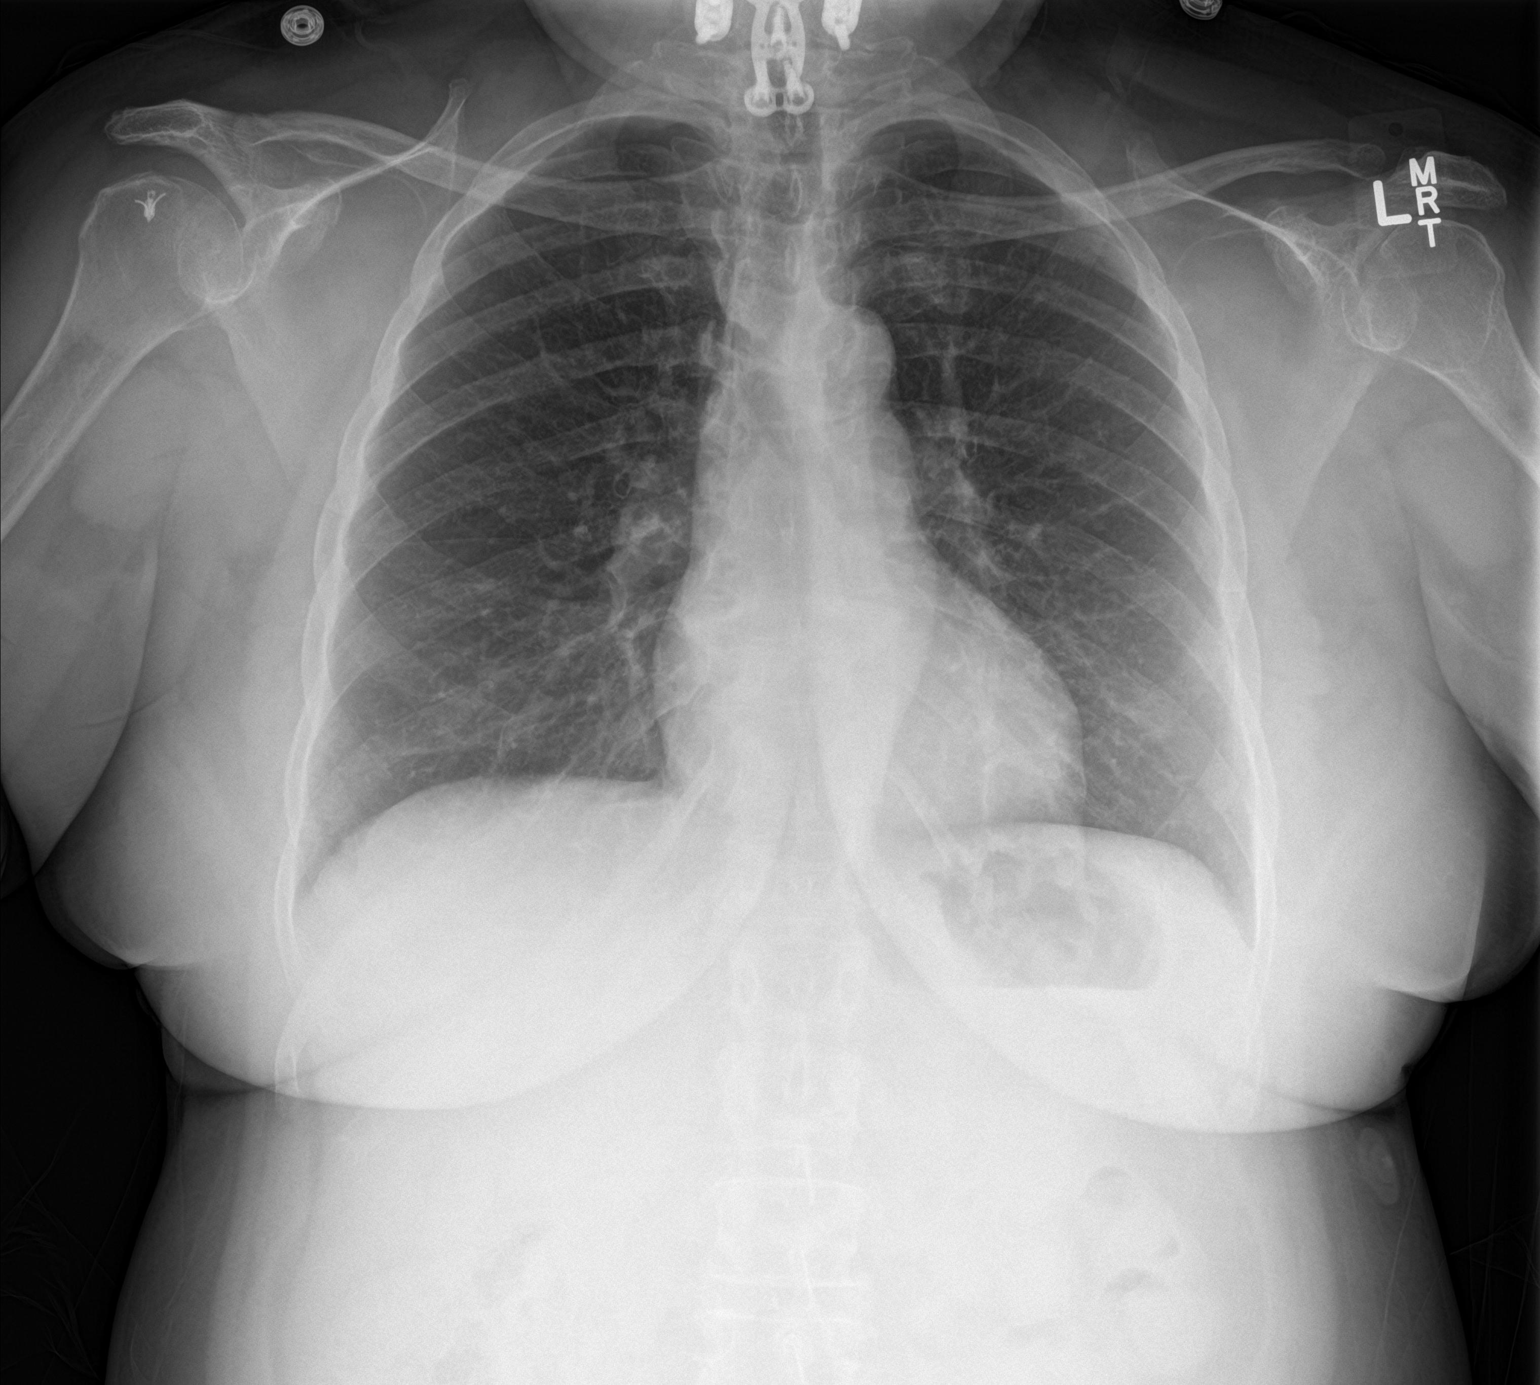

[chest lat]
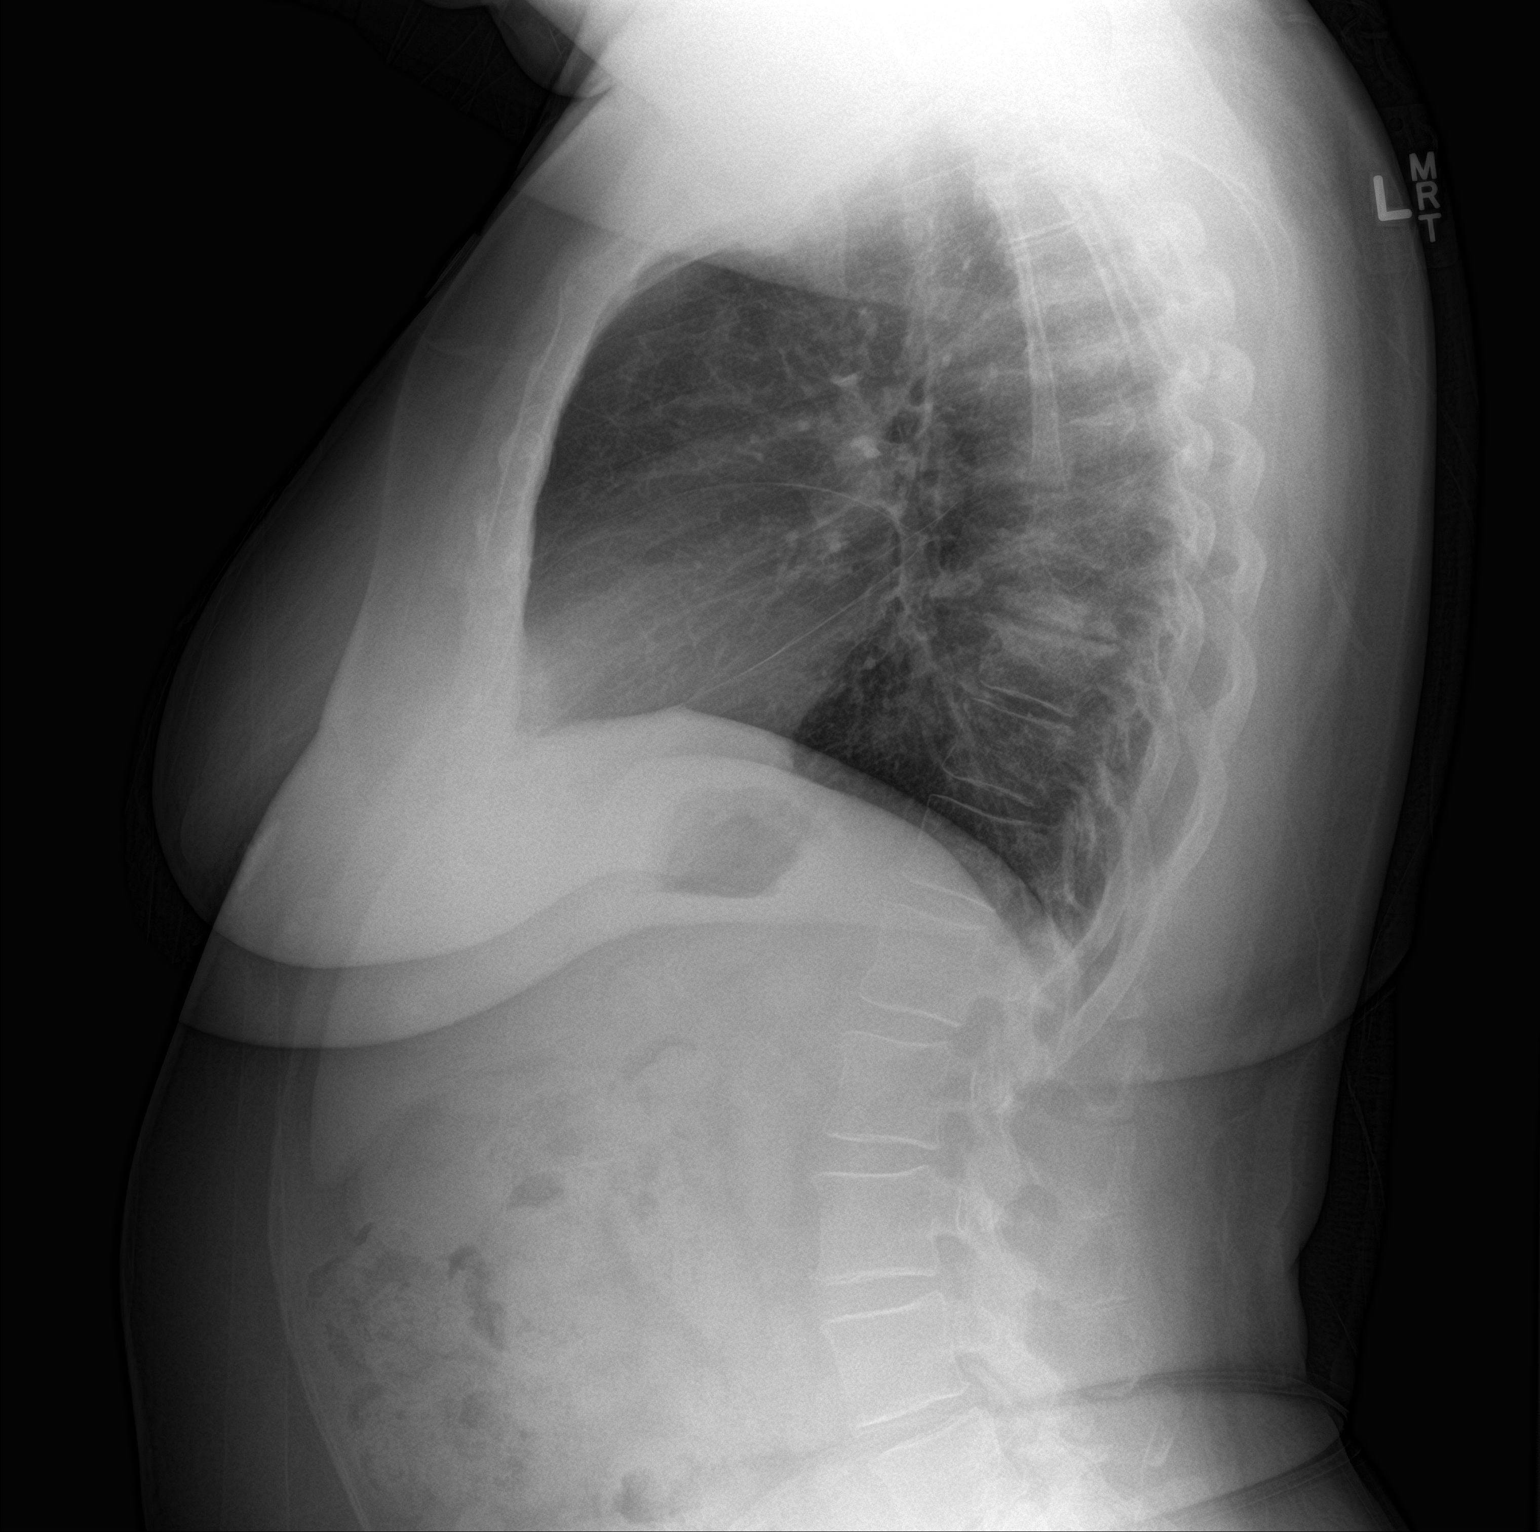

[2 of 2 positions shown; findings below may reference images not displayed]

FINDINGS: The heart size and mediastinal contours are within normal limits.
Both lungs are clear. No evidence of pneumothorax or pleural
effusion. Cervical spine fusion hardware again noted.
IMPRESSION: No active cardiopulmonary disease.

## 2017-12-11 IMAGING — MR MR ABDOMEN WO/W CM
9 of 18 series · 21 of 48 positions shown · IV contrast (multihance)
Comparison: CT scans 07/06/2013 and 01/14/2015

CLINICAL DATA: Evaluate right renal lesion seen on recent CT scan.
History of left renal cell cancer, status post partial nephrectomy.

EXAM:
MRI ABDOMEN WITHOUT AND WITH CONTRAST
TECHNIQUE: Multiplanar multisequence MR imaging of the abdomen was performed
both before and after the administration of intravenous contrast.
CONTRAST:  14 cc MultiHance

[Series 3: DWI b500 · axial · 6.0mm · 1.48mm/px · z∈[+0,+242]mm · 3 of 64 slices shown]
[im 1/64]
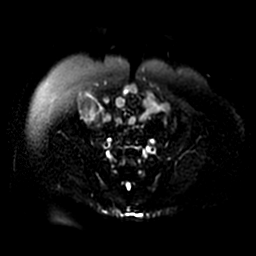
[im 32/64]
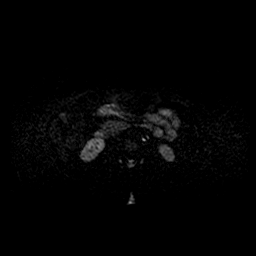
[im 64/64]
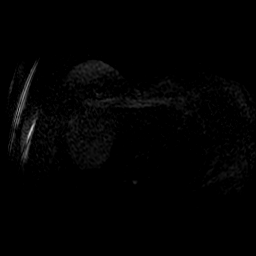

[Series 4: T2 fat-sat · axial · 5.0mm · 0.78mm/px · z∈[+17,+227]mm · 3 of 43 slices shown]
[im 1/43]
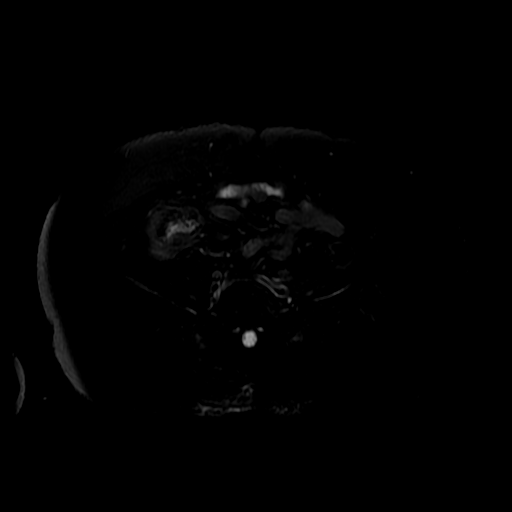
[im 22/43]
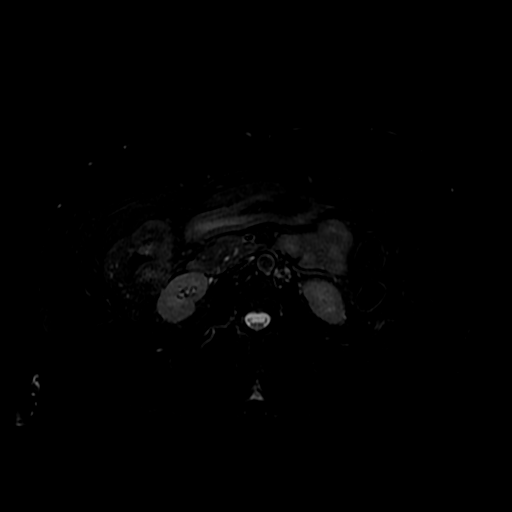
[im 43/43]
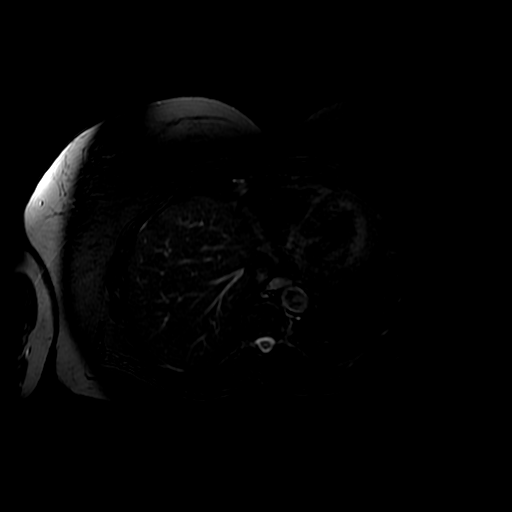

[Series 5: ax dualecho · axial · 5.0mm · 0.78mm/px · z∈[+17,+227]mm · 3 of 86 slices shown]
[im 1/86]
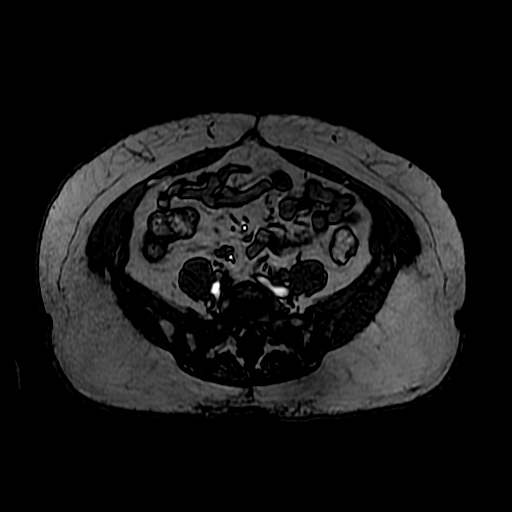
[im 43/86]
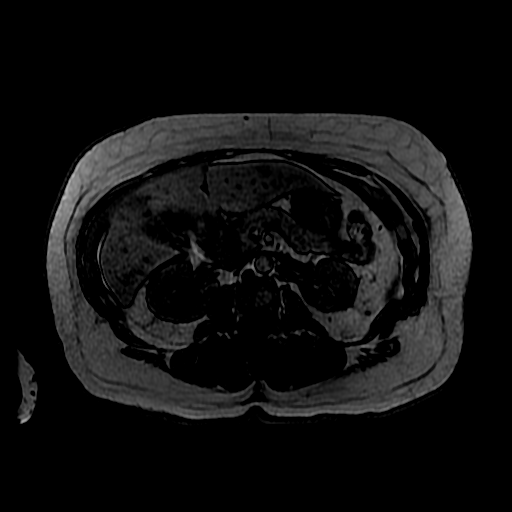
[im 86/86]
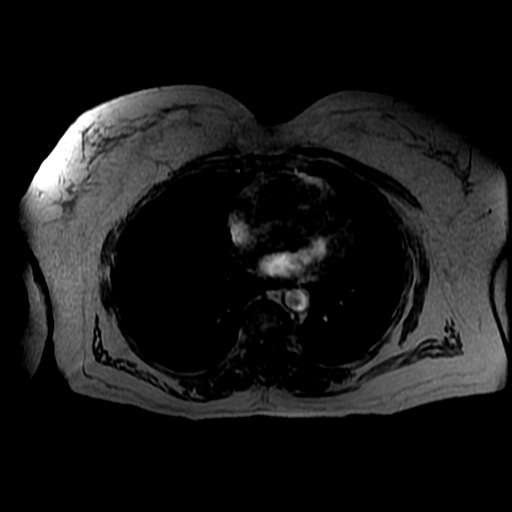

[Series 6: T2 · axial · 5.0mm · 0.78mm/px · z∈[+17,+227]mm · 2 of 43 slices shown (1 of 2)]
[im 1/43]
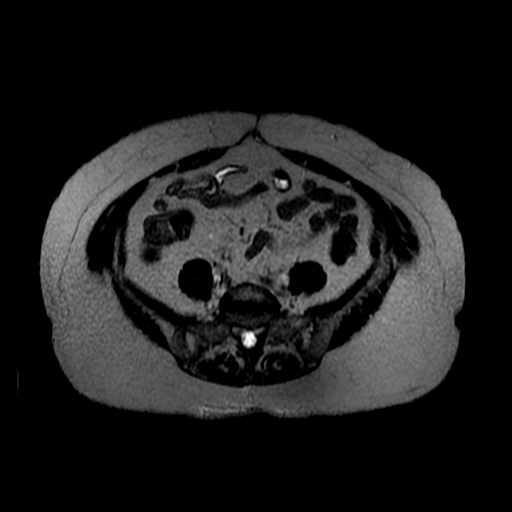
[im 43/43]
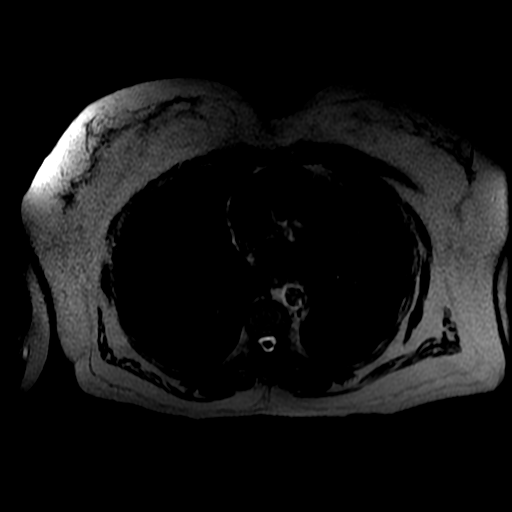

[Series 7: T2 · coronal · 5.0mm · 0.78mm/px · 2 of 40 slices shown (2 of 2)]
[im 1/40]
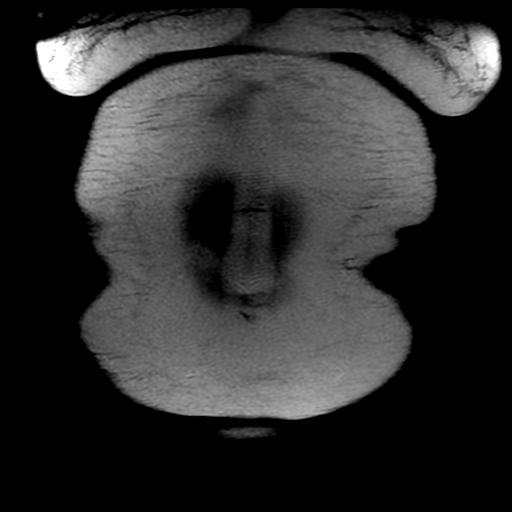
[im 40/40]
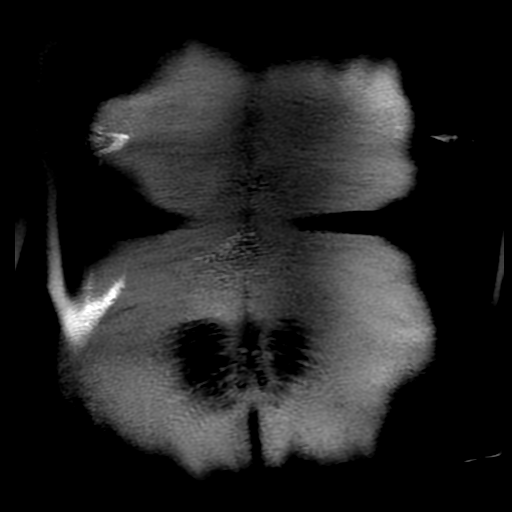

[Series 8: bSSFP · axial · 5.0mm · 0.78mm/px · z∈[+17,+227]mm · 2 of 43 slices shown]
[im 1/43]
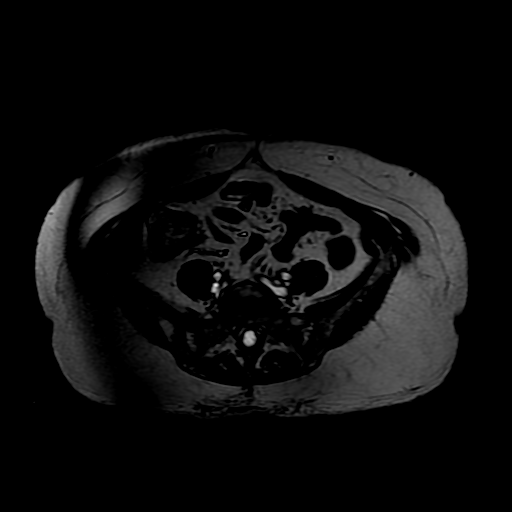
[im 43/43]
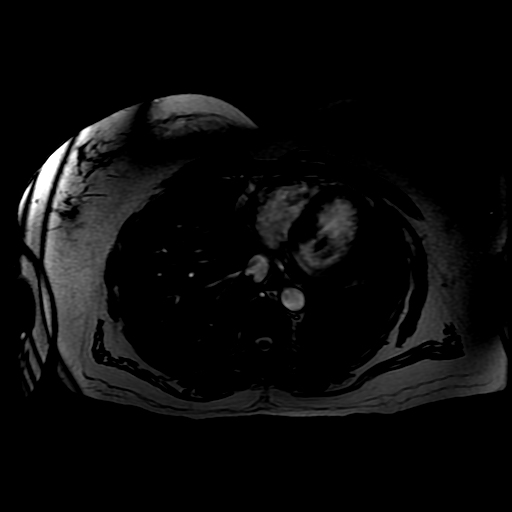

[Series 300: DWI · axial · 6.0mm · 1.48mm/px · 1 of 32 slices shown]
[im 1/32]
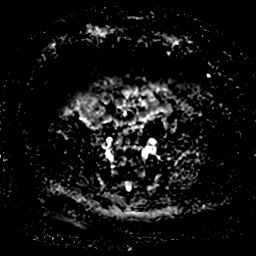

[Series 900: T1 dynamic · axial · 5.0mm · 0.78mm/px · z∈[+33,+250]mm · 3 of 88 slices shown (1 of 2)]
[im 1/88]
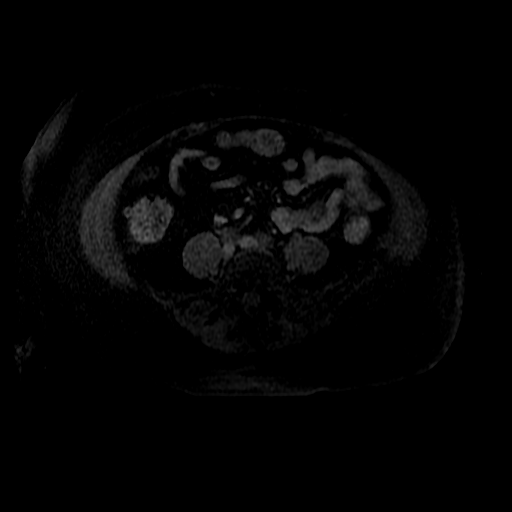
[im 44/88]
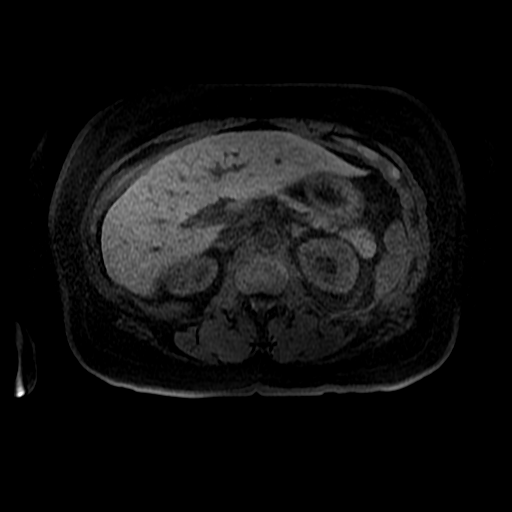
[im 88/88]
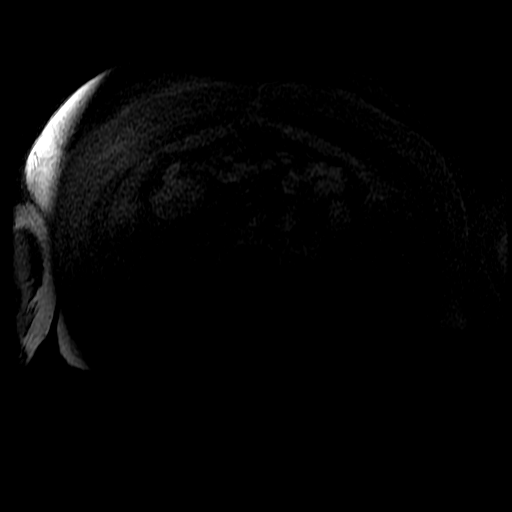

[Series 901: T1 dynamic · axial · 5.0mm · 0.78mm/px · z∈[+33,+140]mm · 2 of 88 slices shown (2 of 2)]
[im 1/88]
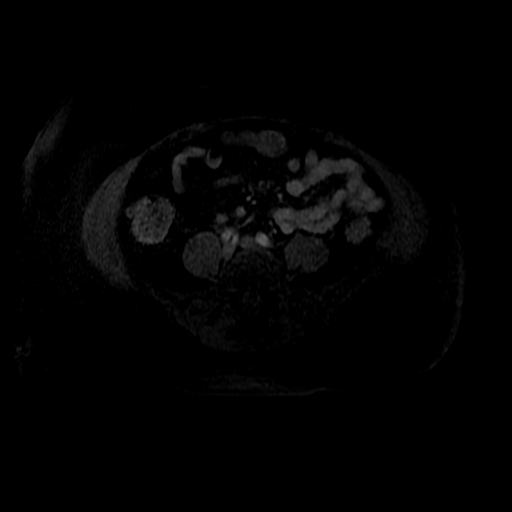
[im 44/88]
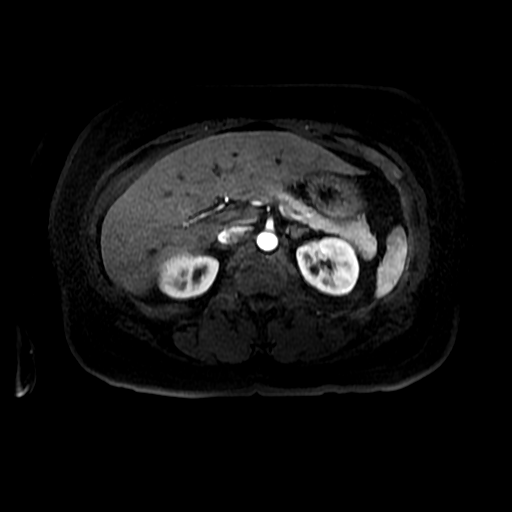

[21 of 48 positions shown; findings below may reference images not displayed]

FINDINGS: Lower chest: The lung bases are grossly clear. No pleural or
pericardial effusion.

Hepatobiliary: 8 mm segment 2 liver lesion has MR imaging
characteristics of a benign hepatic hemangioma. No worrisome hepatic
lesions or intrahepatic biliary dilatation. The gallbladder is
normal. No common bile duct dilatation.

Pancreas: No mass, inflammation or ductal dilatation. Normal caliber
and course of the main pancreatic duct.

Spleen: Normal size.  No focal lesions.

Adrenals/Urinary Tract: The adrenal glands on are normal.

Stable surgical changes involving the left kidney from a prior
partial nephrectomy. No findings for residual or recurrent tumor.

There is a 22 x 16 mm enhancing collecting system mass on the right
side. This is most suspicious for a uroepithelial neoplasm of the
collecting system. It could be a renal cell cancer herniating into
the renal sinus but I think that is less likely. Urine cytology ease
may be helpful. Biopsy may be necessary. No lesions of the renal
parenchyma identified.

Stomach/Bowel: The stomach, duodenum, small bowel and colon are
grossly normal. No inflammatory changes, mass lesions or obstructive
findings.

Vascular/Lymphatic: The aorta and branch vessels are patent. The
major venous structures are patent. No mesenteric or retroperitoneal
mass or lymphadenopathy. Small scattered lymph nodes are noted.
These appear stable.

Other: No ascites or abdominal wall hernia. No subcutaneous lesions.

Musculoskeletal: No significant bony findings.
IMPRESSION: 1. 22 x 16 mm enhancing lesion in the right renal sinus most
suspicious for a uroepithelial neoplasm. Recommend urine cytology
and possible biopsy.
2. No findings for metastatic disease or lymphadenopathy.
3. Stable surgical changes involving the left kidney. No findings
for recurrent tumor.
4. Stable 8 mm left hepatic lobe liver lesion consistent with a
benign hepatic hemangioma.

## 2017-12-16 NOTE — Telephone Encounter (Signed)
I called pt. It does not appear that she has started cpap. She reports that no one from [facility] called her to get set up. I asked her to call [facility] and discuss this with them because she is interested in starting a machine. Pt says that she is "old and lazy" and does not want to call them, she wants them to call her. Pt is agreeable to moving her appt out until January instead of tomorrow and a new appt was scheduled. The appt tomorrow was cancelled. Pt verbalized understanding of new appt date and time.  I called [facility], spoke with [place]. [place] spoke with pt on 09/16/17 about getting set up on a cpap but pt declined being set up due to the "auto pay" requirement.  I called pt and reminded her of this. She does not remember this phone call but is interested in starting the cpap. I will ask [facility] to call her again but also gave her [facility] phone number for her to call as well. I asked pt to call me in a week if she has not heard from [facility].  I called [facility] and spoke with Suanne Marker and asked her to reach out to pt again to discuss starting cpap. Suanne Marker will take care of this.

## 2017-12-17 ENCOUNTER — Ambulatory Visit: Payer: Self-pay | Admitting: Neurology

## 2017-12-24 IMAGING — CT CT RENAL STONE PROTOCOL
2 of 3 series · 16 of 38 positions shown, 18 images · non-contrast
Comparison: CT of the abdomen and pelvis performed 01/14/2015, and
MRI of the abdomen performed 01/30/2015

CLINICAL DATA: Acute onset of worsening right flank pain and
hematuria. Initial encounter.

EXAM:
CT ABDOMEN AND PELVIS WITHOUT CONTRAST
TECHNIQUE: Multidetector CT imaging of the abdomen and pelvis was performed
following the standard protocol without IV contrast.

[Series 4: coronal · coronal · 0.65mm/px · 3 of 95 slices shown]
[im 32/95  soft-tissue]
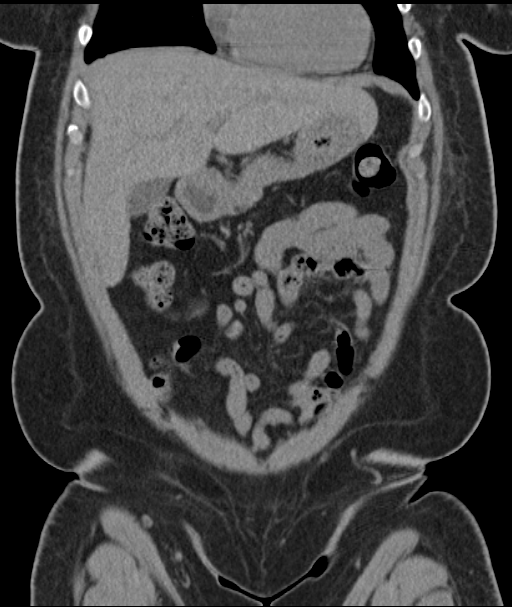
[im 42/95  soft-tissue]
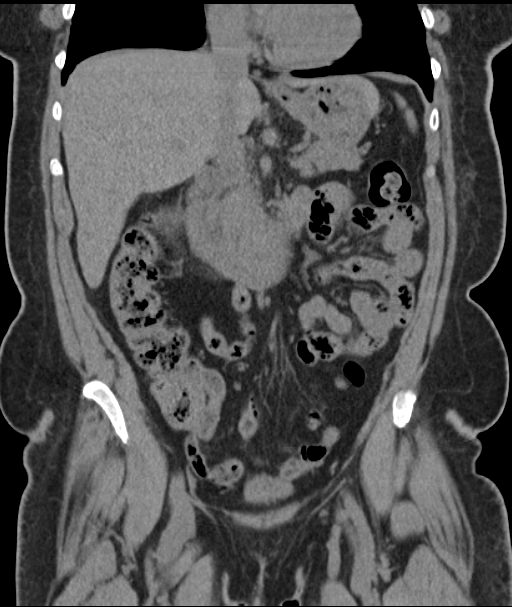
[im 53/95  soft-tissue]
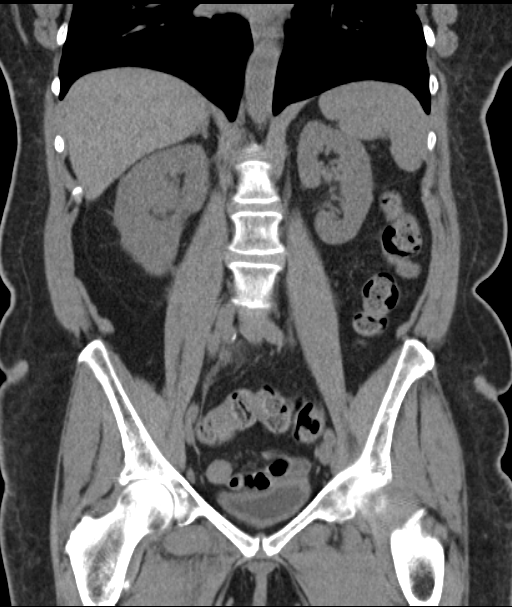

[Series 6: lung · axial · 0.74mm/px · z∈[-147,-42]mm · 13 of 24 slices shown, 15 images]
[im 2/24  soft-tissue]
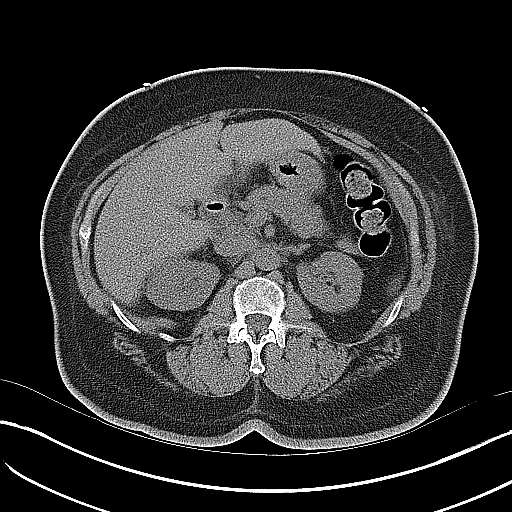
[im 2/24  bone]
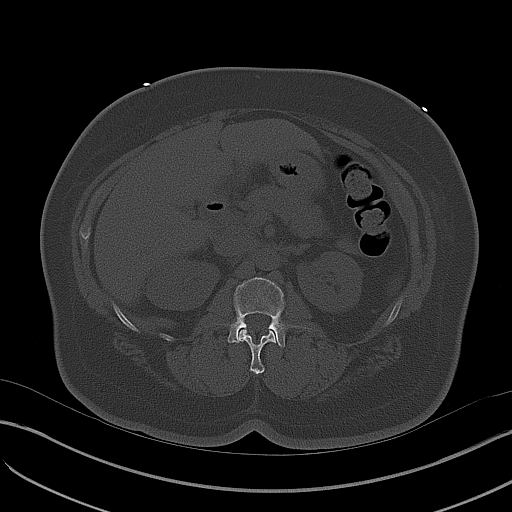
[im 4/24  soft-tissue]
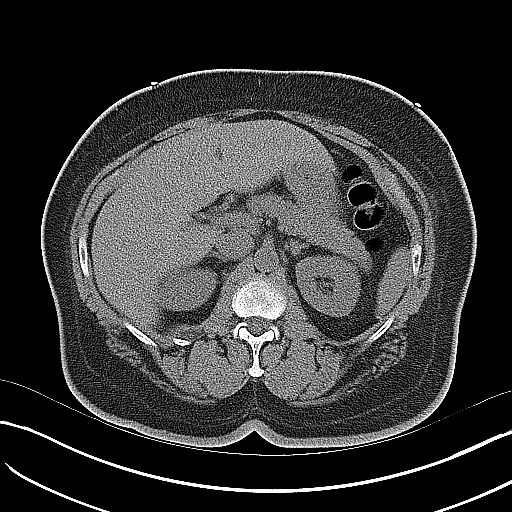
[im 6/24  soft-tissue]
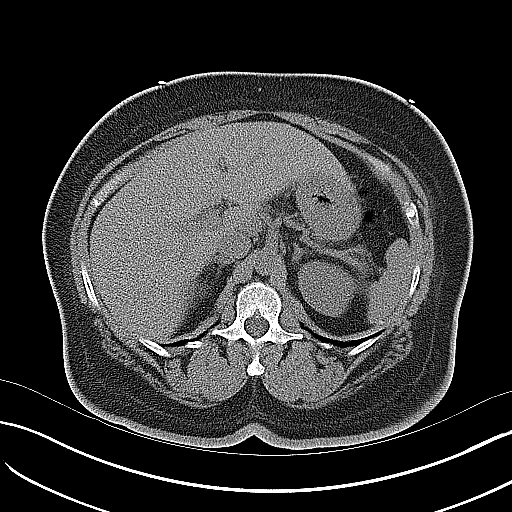
[im 7/24  soft-tissue]
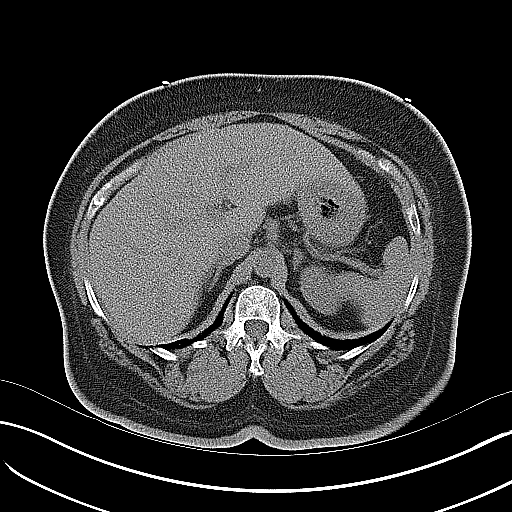
[im 9/24  soft-tissue]
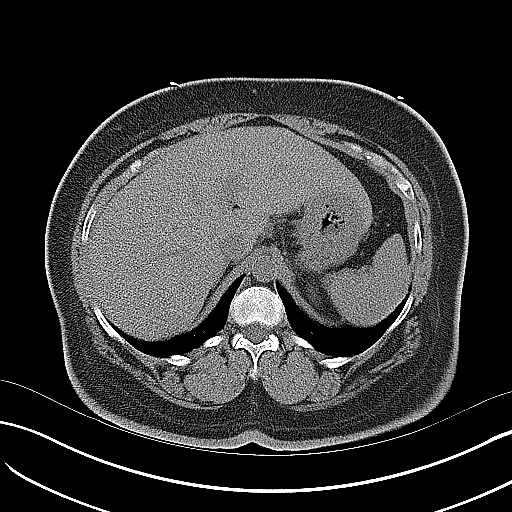
[im 11/24  soft-tissue]
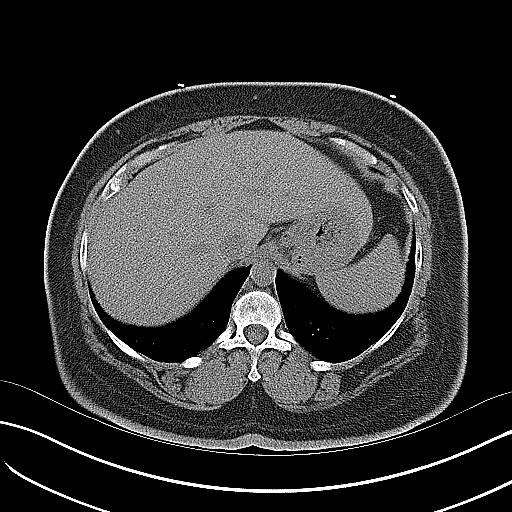
[im 13/24  soft-tissue]
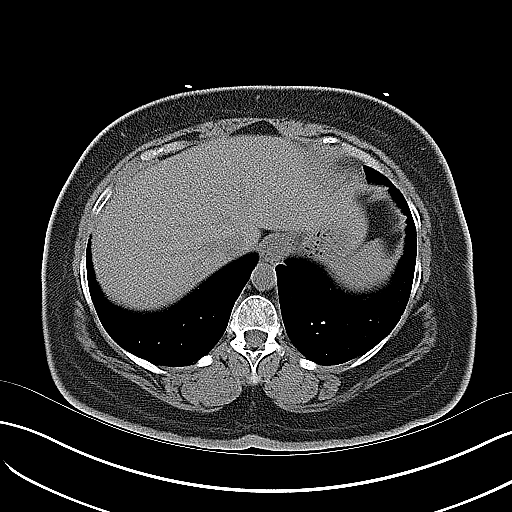
[im 14/24  soft-tissue]
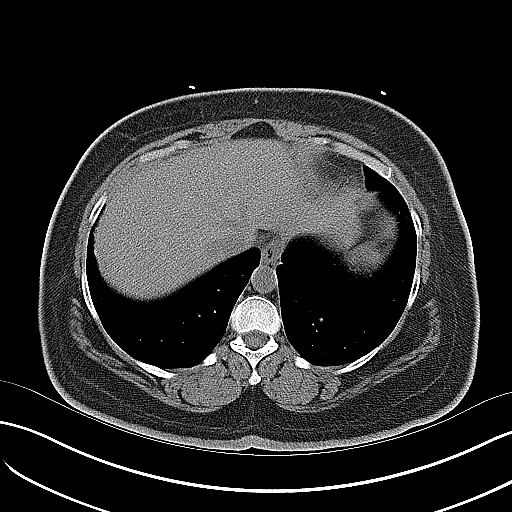
[im 16/24  soft-tissue]
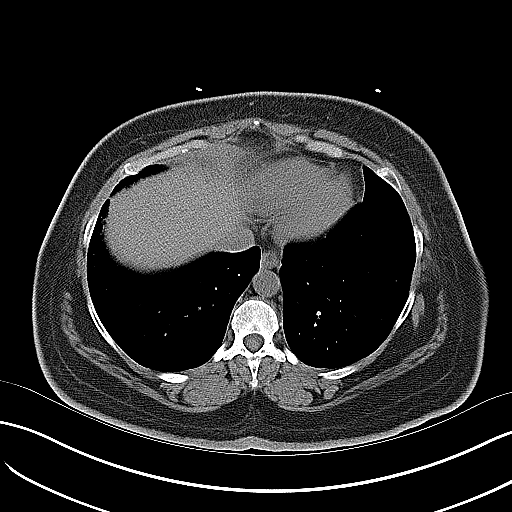
[im 16/24  bone]
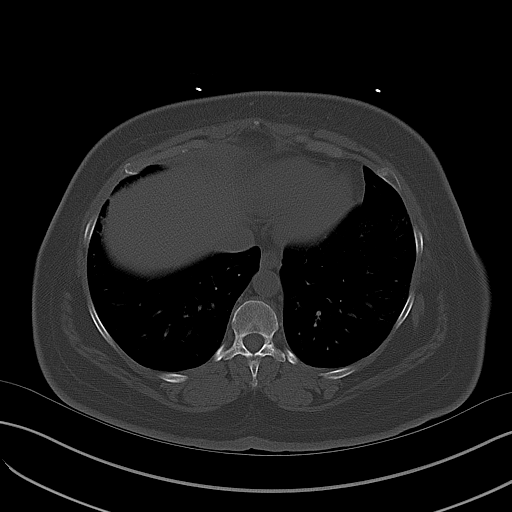
[im 18/24  soft-tissue]
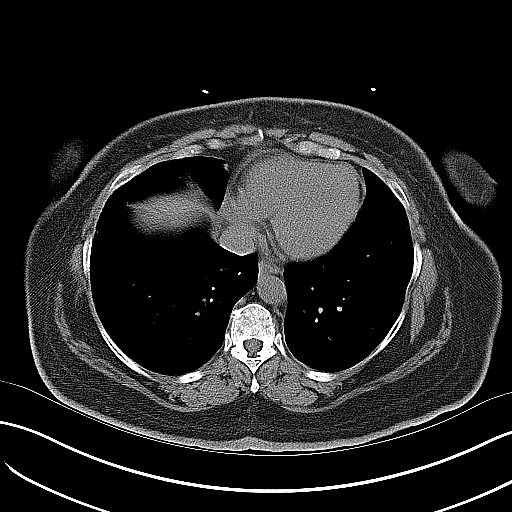
[im 19/24  soft-tissue]
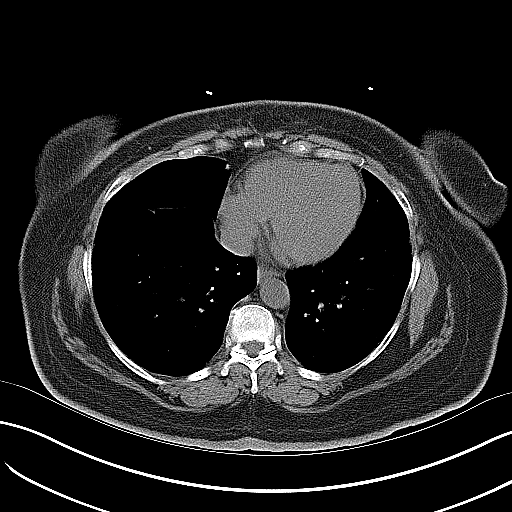
[im 21/24  soft-tissue]
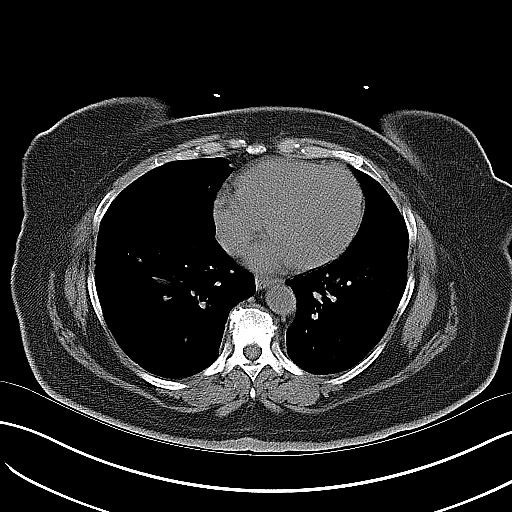
[im 23/24  soft-tissue]
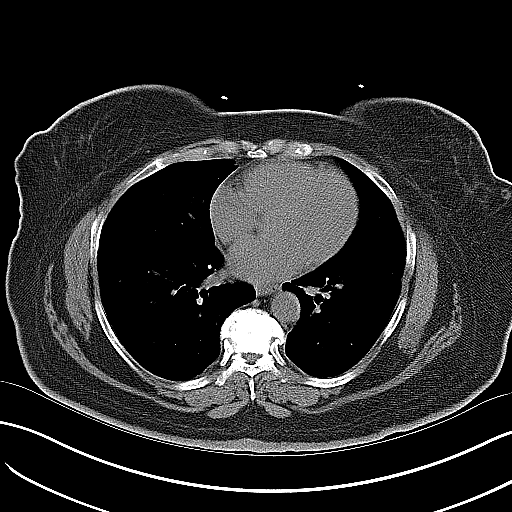

[16 of 38 positions shown; findings below may reference images not displayed]

FINDINGS: Minimal bibasilar opacities may reflect a mild infectious process or
chronic scarring.

The liver and spleen are unremarkable in appearance. The gallbladder
is within normal limits. The pancreas and adrenal glands are
unremarkable.

Mild right-sided perinephric stranding is noted. There is mild
right-sided pelvicaliectasis, with mild stranding along the course
of the right ureter, raising question for mild ureteritis and
pyelonephritis. There is mild scarring involving the lower pole of
the left kidney. No obstructing ureteral stones are identified.

No free fluid is identified. The small bowel is unremarkable in
appearance. The stomach is within normal limits. No acute vascular
abnormalities are seen.

The appendix is grossly normal in caliber, without evidence of
appendicitis. The colon is unremarkable in appearance.

The bladder is relatively decompressed. A small amount of air is
noted within the bladder. This could reflect recent surgery, though
would correlate clinically to assess for emphysematous cystitis. No
inguinal lymphadenopathy is seen.

No acute osseous abnormalities are identified. Facet disease is
noted at the lower lumbar spine.
IMPRESSION: 1. Mild right-sided perinephric stranding noted. Mild stranding
along the course of the right ureter. This raises question for mild
ureteritis and pyelonephritis.
2. Small amount of air within the bladder. This could reflect recent
surgery, though would correlate clinically to assess for
emphysematous cystitis.
3. Persistent minimal bibasilar opacities may reflect a mild
infectious process or chronic scarring.

## 2017-12-29 ENCOUNTER — Other Ambulatory Visit: Payer: Self-pay | Admitting: Urology

## 2017-12-29 DIAGNOSIS — Z85528 Personal history of other malignant neoplasm of kidney: Secondary | ICD-10-CM

## 2017-12-30 ENCOUNTER — Other Ambulatory Visit: Payer: Self-pay | Admitting: Urology

## 2017-12-30 DIAGNOSIS — Z85528 Personal history of other malignant neoplasm of kidney: Secondary | ICD-10-CM

## 2018-01-05 IMAGING — CT CT BIOPSY
1 of 11 series · 3 of 32 positions shown, 7 images · non-contrast
Comparison: MRI of the abdomen, 01/30/2015

CLINICAL DATA: History of partial nephrectomy for left renal
carcinoma. Development of new mass in the central right kidney
requiring tissue biopsy prior to surgical management decisions. The
mass measures approximately 1.6 x 2.2 cm by MRI.

EXAM:
CT GUIDED CORE BIOPSY OF RIGHT RENAL MASS

[Series 14: post bx · axial · 0.52mm/px · z∈[-72,-52]mm · 3 of 10 slices shown, 7 images]
[im 3/10  soft-tissue]
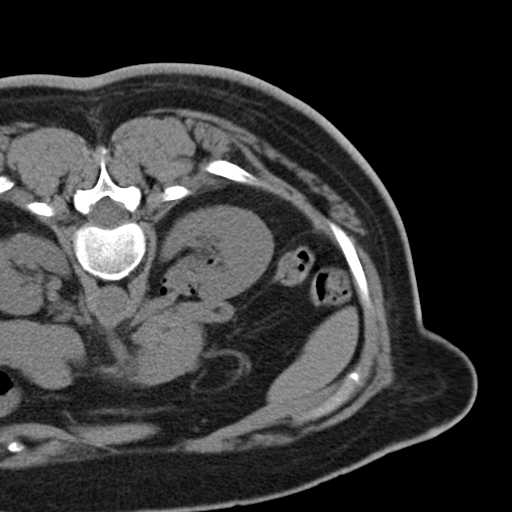
[im 3/10  lung]
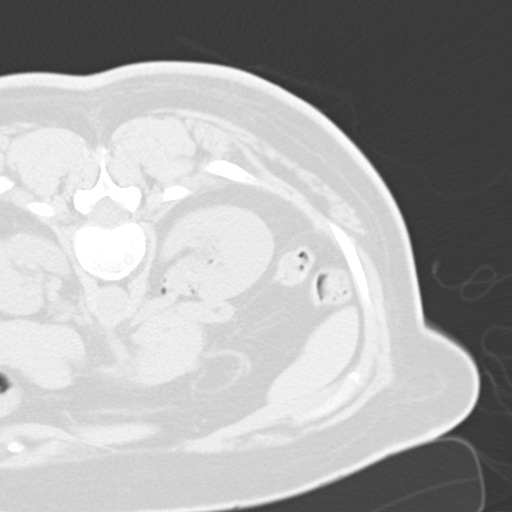
[im 3/10  bone]
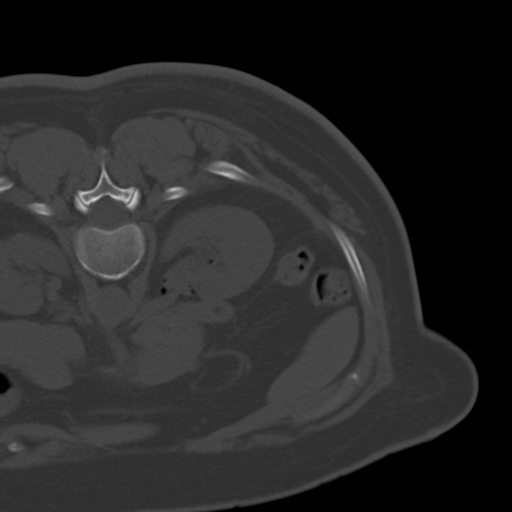
[im 5/10  soft-tissue]
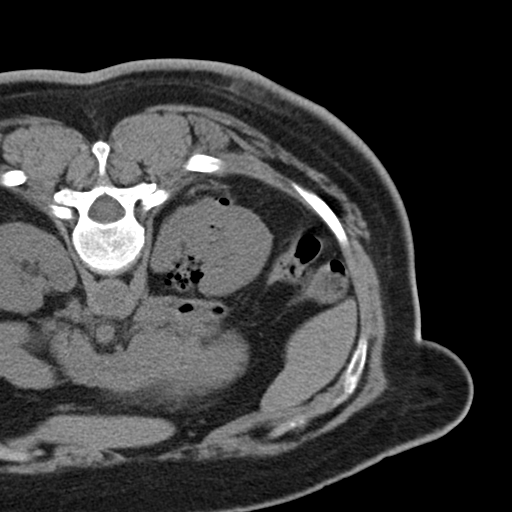
[im 5/10  lung]
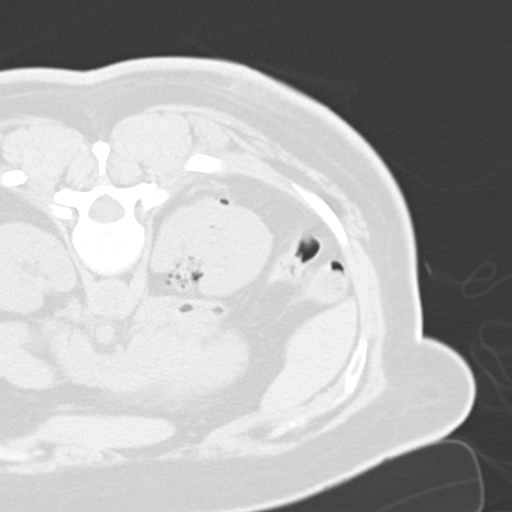
[im 7/10  soft-tissue]
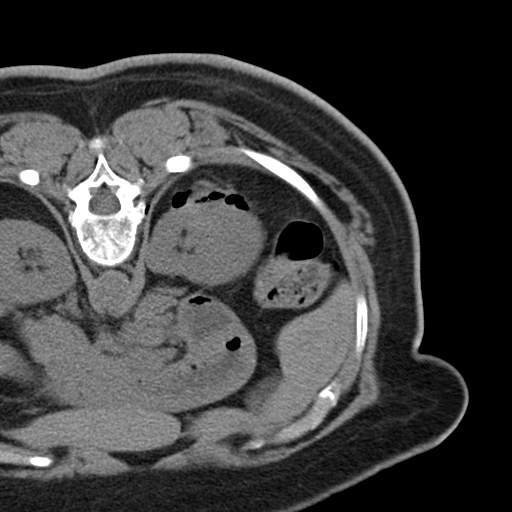
[im 7/10  lung]
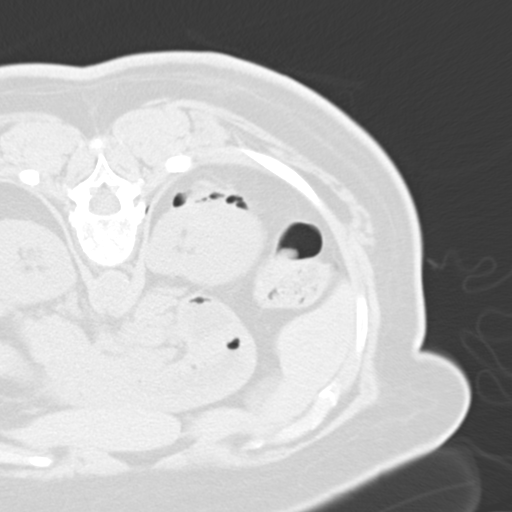

[3 of 32 positions shown; findings below may reference images not displayed]

ANESTHESIA/SEDATION:
1.5  Mg IV Versed; 50 mcg IV Fentanyl

Total Moderate Sedation Time: 22 minutes.

PROCEDURE:
The procedure risks, benefits, and alternatives were explained to
the patient. Questions regarding the procedure were encouraged and
answered. The patient understands and consents to the procedure.

The right flank region was prepped with Betadinein a sterile
fashion, and a sterile drape was applied covering the operative
field. A sterile gown and sterile gloves were used for the
procedure. Local anesthesia was provided with 1% Lidocaine.

CT was performed in a prone position. Under CT guidance, a 17 gauge
trocar needle was advanced to the level of a central right renal
mass. After confirming needle tip position, a total of 2 separate 18
gauge core biopsy samples were obtained and submitted in formalin.
After the procedure several Gel-Foam pledgets were advanced through
the needle as the needle was retracted. Postprocedural CT was
performed.

COMPLICATIONS:
None
FINDINGS: Central right renal mass extending into central sinus fat is visible
by CT. Solid tissue was obtained. There were no immediate bleeding
complications or evidence of immediate hematuria. The patient will
be observed for 3 hours on bedrest after the procedure and monitored
for any development of hematuria.
IMPRESSION: CT-guided core biopsy performed of the central right renal mass.

## 2018-01-08 ENCOUNTER — Ambulatory Visit (HOSPITAL_COMMUNITY)
Admission: RE | Admit: 2018-01-08 | Discharge: 2018-01-08 | Disposition: A | Discharge status: Home / Self Care | Payer: Medicare Other | Source: Ambulatory Visit | Attending: Urology | Admitting: Urology

## 2018-01-08 DIAGNOSIS — Z85528 Personal history of other malignant neoplasm of kidney: Secondary | ICD-10-CM | POA: Diagnosis not present

## 2018-01-08 DIAGNOSIS — Z905 Acquired absence of kidney: Secondary | ICD-10-CM | POA: Diagnosis not present

## 2018-01-12 ENCOUNTER — Ambulatory Visit: Payer: Medicare Other | Admitting: Neurology

## 2018-02-03 IMAGING — CR DG ABDOMEN ACUTE W/ 1V CHEST
3 series · 3 of 3 positions shown · non-contrast
Comparison: None.

CLINICAL DATA: Right-sided abdominal pain.

EXAM:
DG ABDOMEN ACUTE W/ 1V CHEST

[w chest pa]
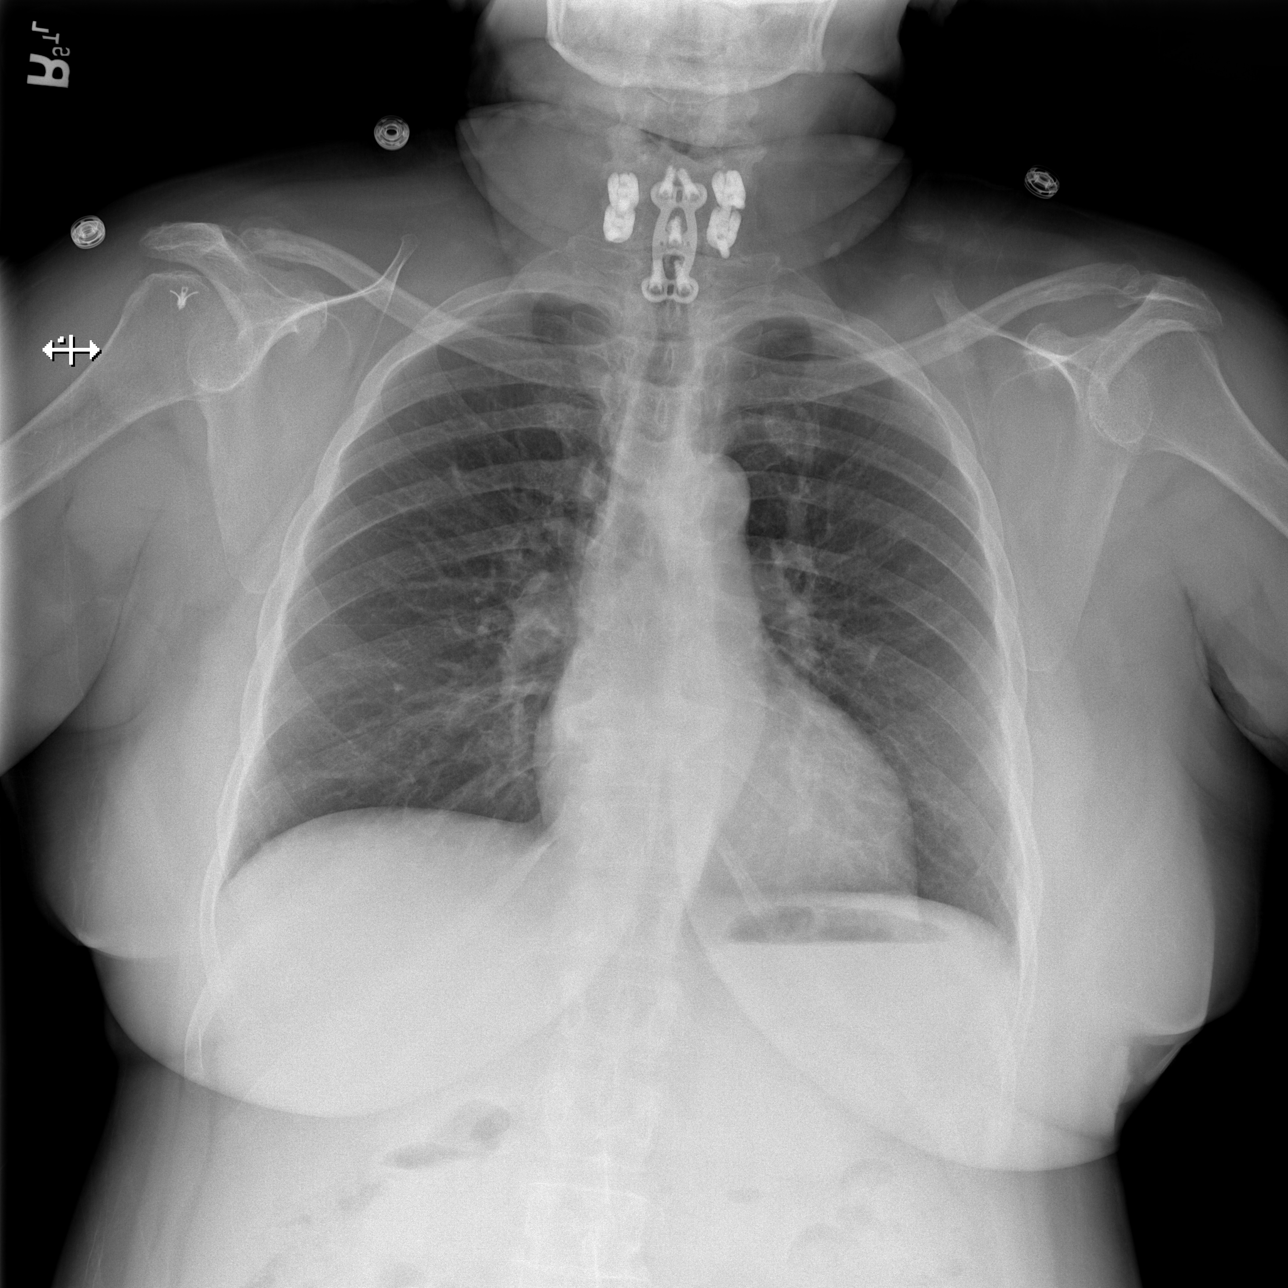

[w abdomen upright]
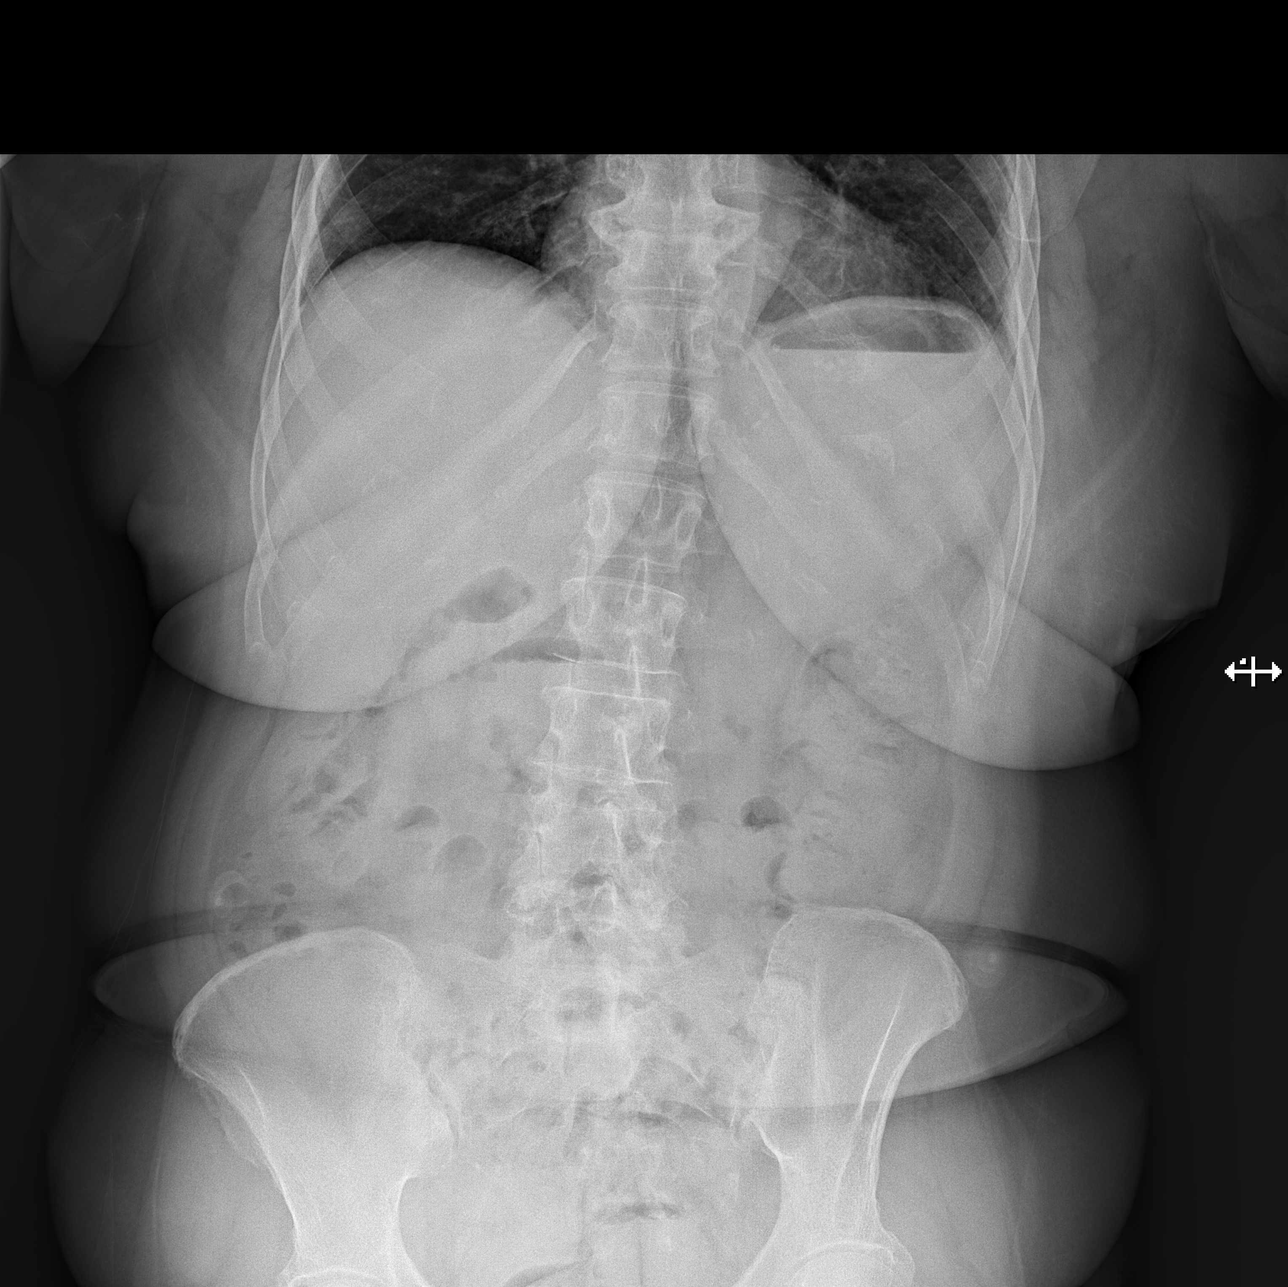

[t abdomen supine]
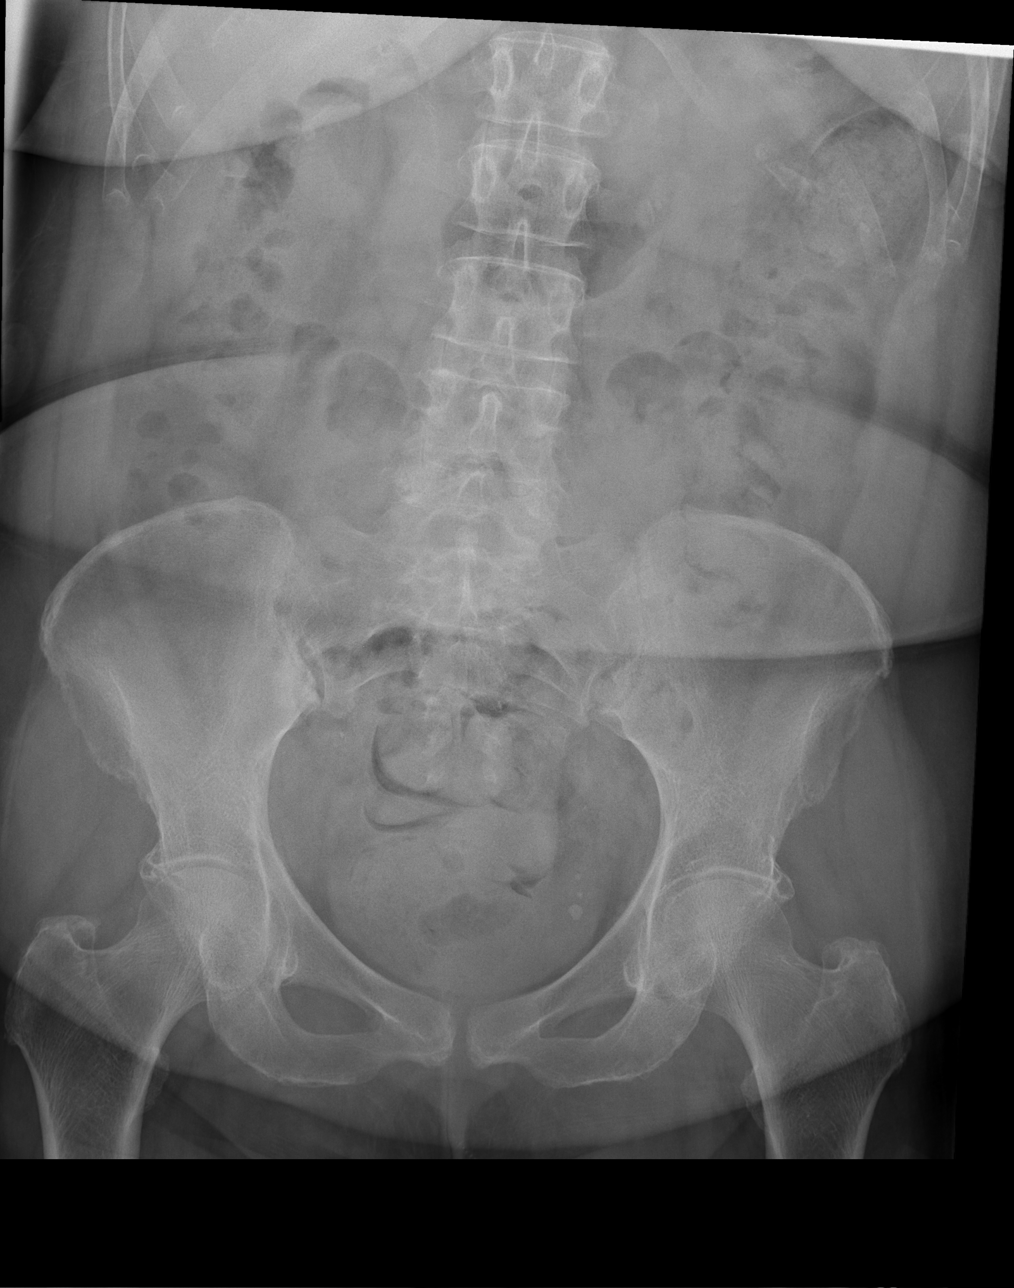

[3 of 3 positions shown; findings below may reference images not displayed]

FINDINGS: There is no evidence of dilated bowel loops or free intraperitoneal
air. Phlebolith is noted in the pelvis. Heart size and mediastinal
contours are within normal limits. Both lungs are clear.
IMPRESSION: No evidence of bowel obstruction or ileus. No acute cardiopulmonary
disease.

## 2018-03-03 ENCOUNTER — Encounter: Payer: Self-pay | Admitting: Neurology

## 2018-03-05 ENCOUNTER — Encounter: Payer: Self-pay | Admitting: Neurology

## 2018-03-05 ENCOUNTER — Ambulatory Visit: Payer: Medicare Other | Admitting: Neurology

## 2018-03-05 VITALS — BP 155/76 | HR 64 | Ht 59.0 in | Wt 159.0 lb

## 2018-03-05 DIAGNOSIS — Z9989 Dependence on other enabling machines and devices: Secondary | ICD-10-CM | POA: Diagnosis not present

## 2018-03-05 DIAGNOSIS — R413 Other amnesia: Secondary | ICD-10-CM

## 2018-03-05 DIAGNOSIS — G4733 Obstructive sleep apnea (adult) (pediatric): Secondary | ICD-10-CM | POA: Diagnosis not present

## 2018-03-05 NOTE — Progress Notes (Signed)
Subjective:    [name] ID: [name] is a 78 y.o. female.  HPI     Interim history:   [name] is a 78 year old right-handed woman with an underlying complex medical history of diabetes, asthma, reflux disease, hypertension, hyperlipidemia, kidney cancer with status post left nephrectomy, sleep apnea, vertigo, chronic kidney disease, arthritis and obesity, who presents for follow-up consultation of [name] memory loss and sleep apnea, after recent testing. [name] is unaccompanied today. I first met [name] on 07/09/2017 at Stefanie request of [name] nephrologist, at which time [name] reported short-term memory issues as well as a prior diagnosis of OSA. [name] MMSE was 27 at Stefanie time. I suggested we proceed with further testing including brain MRI and sleep studies. [name] had a brain MRI without contrast on 07/22/2017 and I reviewed Stefanie results: IMPRESSION: Slightly abnormal MRI scan of Stefanie brain showing age-appropriate changes of chronic microvascular ischemia and generalized cerebral atrophy.  No significant change compared with previous CT head dated 06/01/2017. [name] had a baseline sleep study on 07/29/2017, followed by a CPAP titration study. I went over [name] test results with [name] in detail today. Baseline sleep study from 07/29/2017 showed a sleep latency of 30.5 minutes, REM latency of 129.5 minutes, sleep efficiency reduced at 77.3%. Total AHI was in Stefanie mild range of 5.6 per hour, average oxygen saturation 94%, nadir of 86%. Based on [name] medical history I suggested we proceed with treatment of [name] mild sleep apnea with CPAP. [name] had a titration study on 08/26/2017. [name] was fitted with nasal pillows and CPAP was titrated from 5 cm to 8 cm. On Stefanie final pressure [name] AHI was 0 per hour with nonsupine non-REM sleep achieved an O2 nadir of 93%. Sleep efficiency was markedly reduced with a very long sleep latency. Nevertheless, I suggested [name] try home CPAP therapy at a pressure of 8 cm via nasal  pillows. Today, 03/05/2018: I reviewed [name] CPAP compliance data from 02/02/2018 through 03/03/2018 which is a total of 30 days, during which time [name] used [name] CPAP every night with percent used days greater than 4 hours at 100%, indicating superb compliance with an average usage of 8 hours and 4 minutes, residual AHI at goal at 1.9 per hour, leak acceptable with Stefanie 95th percentile at 13.1 L/m on a pressure of 9 cm with EPR of 3. [name] reports doing okay with CPAP. [name] is indicating full compliance. As far as [name] memory, [name] reports feeling stable. Has noted swelling in LEs, L>R, had recent echo and LE doppler under Dr. Einar Gip, with results pending, appt on 03/12/18.   [name]'s allergies, current medications, family history, past medical history, past social history, past surgical history and problem list were reviewed and updated as appropriate.    Previously:  07/09/17: ([name]) reports short-term memory loss for Stefanie past year. [name] has issues remembering names and dates, short-term memory loss. [name] does not have a family history of dementia as far [name] knows. [name] is an only child. [name] has one daughter who lives in [place] and [name] son died at Stefanie age of 41. [name] is widowed and lives alone. [name] believes that [name] memory loss is due to age. I reviewed your office note from 05/16/2017, which you kindly included. [name] has been on Aricept.   [name] has a diagnosis of sleep apnea but could not tolerate CPAP. [name] would be willing to consider CPAP again.    [name] had a brain MRI without contrast on 10/04/2005  and I reviewed Stefanie results: IMPRESSION:  1. No acute intracranial abnormality.   2. Greater than expected subcortical T2 hyperintensities bilaterally. These were described on previous MRI of 2002. They remain non specific. Differential diagnosis includes microvascular ischemia, chronic migraine headaches, demyelinating process or vasculitis or Stefanie sequelae of prior infection or inflammation.   3. Mild cerebellar  tonsillar ectopia. This is likely to be incidental.   [name] had a head CT without contrast on 06/01/2017 and I reviewed Stefanie results: IMPRESSION: No acute intracranial abnormalities. [name] presented to Stefanie emergency room on 06/01/2017 with blurry vision, abdominal pain, nausea and dizziness. I reviewed Stefanie emergency room records. [name] was treated symptomatically with antiemetic medication and IV fluids for suspected viral GI illness.  [name] Past Medical History Is Significant For: Past Medical History:  Diagnosis Date  . Anginal pain ([place])    just occurs without any specific trigger; pt states has never had to use nitroglycerin tabs; uses rest to relieve  . Arthritis   . Asthma   . Diabetes mellitus    Type 1  . Dysrhythmia    "irregular heart beat"  . GERD (gastroesophageal reflux disease)   . History of urinary tract infection   . Hypercholesteremia   . Hypertension   . left renal ca dx'd 2012 (?)   surg only, left kidney  . Nocturia   . Reflux   . Renal failure (ARF), acute on chronic ([facility])   . Shortness of breath dyspnea    pt denies; states can climb stairs w/o difficulty   . Sleep apnea    does not use c-pap machine  . Tingling in extremities    legs bilat  . Tinnitus   . Urinary frequency   . Urinary incontinence   . Vertigo    occurs when lying flat     [name] Past Surgical History Is Significant For: Past Surgical History:  Procedure Laterality Date  . ABDOMINAL HYSTERECTOMY    . APPENDECTOMY    . CARDIAC CATHETERIZATION N/A 07/28/2014   Procedure: Left Heart Cath and Coronary Angiography;  Surgeon: [name], MD;  Location: [facility];  Service: Cardiovascular;  Laterality: N/A;  . CYSTOSCOPY WITH RETROGRADE PYELOGRAM, URETEROSCOPY AND STENT PLACEMENT Right 02/09/2015   Procedure: CYSTOSCOPY WITH RETROGRADE PYELOGRAM AND URETEROSCOPY ;  Surgeon: Raynelle Bring, MD;  Location: [facility];  Service: Urology;  Laterality: Right;  . LAPAROSCOPIC NEPHRECTOMY Right 03/20/2015    Procedure: LAPAROSCOPIC NEPHRECTOMY;  Surgeon: Raynelle Bring, MD;  Location: [facility];  Service: Urology;  Laterality: Right;  . PERIPHERAL VASCULAR CATHETERIZATION N/A 07/28/2014   Procedure: Aortic Arch Angiography;  Surgeon: [name], MD;  Location: [facility];  Service: Cardiovascular;  Laterality: N/A;  . RENAL MASS EXCISION    . SPINE SURGERY      [name] Family History Is Significant For: Family History  Problem Relation Age of Onset  . Hypertension Mother   . Coronary artery disease Unknown     [name] Social History Is Significant For: Social History   Socioeconomic History  . Marital status: Widowed    Spouse name: Not on file  . Number of children: Not on file  . Years of education: Not on file  . Highest education level: Not on file  Occupational History  . Not on file  Social Needs  . Financial resource strain: Not on file  . Food insecurity:    Worry: Not on file    Inability: Not on file  .  Transportation needs:    Medical: Not on file    Non-medical: Not on file  Tobacco Use  . Smoking status: Never Smoker  . Smokeless tobacco: Never Used  Substance and Sexual Activity  . Alcohol use: No  . Drug use: No    Frequency: 1.0 times per week  . Sexual activity: Never  Lifestyle  . Physical activity:    Days per week: Not on file    Minutes per session: Not on file  . Stress: Not on file  Relationships  . Social connections:    Talks on phone: Not on file    Gets together: Not on file    Attends religious service: Not on file    Active member of club or organization: Not on file    Attends meetings of clubs or organizations: Not on file    Relationship status: Not on file  Other Topics Concern  . Not on file  Social History Narrative  . Not on file    [name] Allergies Are:  Allergies  Allergen Reactions  . Sulfa Antibiotics Itching  :   [name] Current Medications Are:  Outpatient Encounter Medications as of 03/05/2018  Medication Sig  . albuterol  (PROVENTIL,VENTOLIN) 90 MCG/ACT inhaler Inhale 2 puffs into Stefanie lungs every 4 (four) hours as needed for wheezing or shortness of breath.   Marland Kitchen amLODipine (NORVASC) 10 MG tablet Take 10 mg by mouth daily.  Marland Kitchen aspirin EC 81 MG tablet Take 81 mg by mouth at bedtime.  Marland Kitchen atorvastatin (LIPITOR) 10 MG tablet Take 10 mg by mouth every morning.   . cholecalciferol (VITAMIN D) 1000 units tablet Take 1,000 Units by mouth daily.  Marland Kitchen donepezil (ARICEPT) 5 MG tablet Take 5 mg by mouth daily.  Marland Kitchen gabapentin (NEURONTIN) 300 MG capsule Take 1 capsule (300 mg total) by mouth at bedtime.  . Insulin Degludec (TRESIBA FLEXTOUCH) 200 UNIT/ML SOPN Inject 74 Units into Stefanie skin daily before breakfast.   . metoprolol tartrate (LOPRESSOR) 25 MG tablet Take 25 mg by mouth 2 (two) times daily.   . mirabegron ER (MYRBETRIQ) 50 MG TB24 tablet Take 50 mg by mouth daily.  . Multiple Vitamins-Minerals (CENTRUM SILVER ADULT 50+ PO) Take 1 tablet by mouth daily.  . nitroGLYCERIN (NITROSTAT) 0.4 MG SL tablet Place 0.4 mg under Stefanie tongue every 5 (five) minutes as needed for chest pain.  . TRADJENTA 5 MG TABS tablet Take 5 mg by mouth daily.  . [DISCONTINUED] ondansetron (ZOFRAN ODT) 4 MG disintegrating tablet Take 1 tablet (4 mg total) by mouth every 8 (eight) hours as needed for nausea or vomiting.   No facility-administered encounter medications on file as of 03/05/2018.   :  Review of Systems:  Out of a complete 14 point review of systems, all are reviewed and negative with Stefanie exception of these symptoms as listed below: Review of Systems  Neurological:       Pt presents today to discuss [name] cpap and memory. Pt reports that [name] cpap is going ok.    Objective:  Neurological Exam  Physical Exam Physical Examination:   Vitals:   03/05/18 1309  BP: (!) 155/76  Pulse: 64    General Examination: [name] is a very pleasant 78 y.o. female in no acute distress. [name] appears well-developed and well-nourished and well  groomed. Good spirits.   HEENT: Normocephalic, atraumatic, pupils are equal, round and reactive to light and accommodation. Extraocular tracking is good without limitation to gaze excursion or nystagmus  noted. Normal smooth pursuit is noted. Hearing is grossly intact. Face is symmetric with normal facial animation and normal facial sensation. Speech is clear with no dysarthria noted. There is no hypophonia. There is no lip, neck/head, jaw or voice tremor. Neck is supple with full range of passive and active motion. There are no carotid bruits on auscultation. Oropharynx exam reveals: mild mouth dryness, adequate dental hygiene with no teeth on top and moderate airway crowding, due to redundant soft palate, tonsils are 1+, Mallampati is class II.  Chest: Clear to auscultation without wheezing, rhonchi or crackles noted.  Heart: S1+S2+0, regular and normal without murmurs, rubs or gallops noted.   Abdomen: Soft, non-tender and non-distended with normal bowel sounds appreciated on auscultation.  Extremities: There is 1+ pitting edema in Stefanie distal lower extremities bilaterally, left slightly worse than right.  Skin: Warm and dry without trophic changes noted. There are no varicose veins.  Musculoskeletal: exam reveals no obvious joint deformities, tenderness or joint swelling or erythema.   Neurologically:  Mental status: [name] is awake, alert and oriented in all 4 spheres. [name] immediate and remote memory, attention, language skills and fund of knowledge are Mildly impaired. There is no evidence of aphasia, agnosia, apraxia or anomia. Speech is clear with normal prosody and enunciation. Thought process is linear. Mood is normal and affect is normal.   On 07/09/2017: MMSE: 27/30, CDT: 4/4, AFT: 9/min.   On 03/05/2018: MMSE: 29/30, CDT: 4/4, AFT: 11/min.  Cranial nerves II - XII are as described above under HEENT exam. In addition: shoulder shrug is normal with equal shoulder height  noted. Motor exam: Normal bulk, strength and tone is noted. There is no tremor or rebound. Romberg is not tested for safety. Fine motor skills and coordination: grossly intact.  Cerebellar testing: No dysmetria or intention tremor. There is no truncal or gait ataxia.  Sensory exam: intact to light touch in Stefanie upper and lower extremities.  Gait, station and balance: [name] stands without significant difficulty, no limp, walking is age-appropriate.  Assessment and Plan:   In summary, [name] is a very pleasant 78 year old female with an underlying complex medical history of diabetes, asthma, reflux disease, hypertension, hyperlipidemia, kidney cancer with status post left nephrectomy, sleep apnea, vertigo, chronic kidney disease, arthritis and obesity, who presents for follow-up consultation of [name] memory loss and [name] sleep apnea. On examination, [name] has evidence of mild memory loss, in fact, [name] memory scores are little better today. [name] is on donepezil per primary care. [name] can continue with that. We did an interim brain MRI which showed fairly age-appropriate findings thankfully. [name] had interim sleep study testing which revealed mild sleep apnea. [name] has established treatment with CPAP therapy and is fully compliant. [name] is highly commended for this. [name] is encouraged to continue with Stefanie current medication regimen and CPAP therapy. [name] has a follow-up pending with [name] cardiologist after recent testing including echocardiogram and lower extremity Doppler ultrasound from what I understand. From my end of things I suggested [name] come back in 6 months to see one of our nurse practitioners. I answered all [name] questions today and [name] was in agreement. I spent 25 minutes in total face-to-face time with [name], more than 50% of which was spent in counseling and coordination of care, reviewing test results, reviewing medication and discussing or reviewing Stefanie diagnosis of OSA and memory loss, its  prognosis and treatment options. Pertinent laboratory and imaging test results that were available  during this visit with [name] were reviewed by me and considered in my medical decision making (see chart for details).

## 2018-03-05 NOTE — Patient Instructions (Addendum)
Your brain MRI from June 2019 showed age-appropriate findings which is very reassuring. You are fully compliant with CPAP therapy, please continue with that.   Your memory score is stable to slightly better today.  Please continue using your CPAP regularly. While your insurance requires that you use CPAP at least 4 hours each night on 70% of the nights, I recommend, that you not skip any nights and use it throughout the night if you can. Getting used to CPAP and staying with the treatment long term does take time and patience and discipline. Untreated obstructive sleep apnea when it is moderate to severe can have an adverse impact on cardiovascular health and raise her risk for heart disease, arrhythmias, hypertension, congestive heart failure, stroke and diabetes. Untreated obstructive sleep apnea causes sleep disruption, nonrestorative sleep, and sleep deprivation. This can have an impact on your day to day functioning and cause daytime sleepiness and impairment of cognitive function, memory loss, mood disturbance, and problems focussing. Using CPAP regularly can improve these symptoms.  Keep up the good work! We can see you in 6 months for a routine recheck on your memory and her sleep apnea. Please see Ward Givens, nurse practitioner.

## 2018-03-25 ENCOUNTER — Emergency Department (HOSPITAL_COMMUNITY): Payer: Medicare Other

## 2018-03-25 ENCOUNTER — Other Ambulatory Visit: Payer: Self-pay

## 2018-03-25 ENCOUNTER — Encounter (HOSPITAL_COMMUNITY): Payer: Self-pay

## 2018-03-25 ENCOUNTER — Inpatient Hospital Stay (HOSPITAL_COMMUNITY)
Admission: EM | Admit: 2018-03-25 | Discharge: 2018-03-27 | DRG: 684 | Disposition: A | Discharge status: Home / Self Care | Payer: Medicare Other | Attending: Internal Medicine | Admitting: Internal Medicine

## 2018-03-25 DIAGNOSIS — N179 Acute kidney failure, unspecified: Principal | ICD-10-CM | POA: Diagnosis present

## 2018-03-25 DIAGNOSIS — E875 Hyperkalemia: Secondary | ICD-10-CM | POA: Diagnosis present

## 2018-03-25 DIAGNOSIS — M47816 Spondylosis without myelopathy or radiculopathy, lumbar region: Secondary | ICD-10-CM | POA: Diagnosis present

## 2018-03-25 DIAGNOSIS — E1122 Type 2 diabetes mellitus with diabetic chronic kidney disease: Secondary | ICD-10-CM | POA: Diagnosis present

## 2018-03-25 DIAGNOSIS — Z87440 Personal history of urinary (tract) infections: Secondary | ICD-10-CM

## 2018-03-25 DIAGNOSIS — N184 Chronic kidney disease, stage 4 (severe): Secondary | ICD-10-CM | POA: Diagnosis present

## 2018-03-25 DIAGNOSIS — Z8249 Family history of ischemic heart disease and other diseases of the circulatory system: Secondary | ICD-10-CM

## 2018-03-25 DIAGNOSIS — Z905 Acquired absence of kidney: Secondary | ICD-10-CM

## 2018-03-25 DIAGNOSIS — Z794 Long term (current) use of insulin: Secondary | ICD-10-CM

## 2018-03-25 DIAGNOSIS — E785 Hyperlipidemia, unspecified: Secondary | ICD-10-CM | POA: Diagnosis present

## 2018-03-25 DIAGNOSIS — N12 Tubulo-interstitial nephritis, not specified as acute or chronic: Secondary | ICD-10-CM | POA: Diagnosis not present

## 2018-03-25 DIAGNOSIS — R8271 Bacteriuria: Secondary | ICD-10-CM | POA: Diagnosis present

## 2018-03-25 DIAGNOSIS — Z7982 Long term (current) use of aspirin: Secondary | ICD-10-CM

## 2018-03-25 DIAGNOSIS — Z85528 Personal history of other malignant neoplasm of kidney: Secondary | ICD-10-CM

## 2018-03-25 DIAGNOSIS — I129 Hypertensive chronic kidney disease with stage 1 through stage 4 chronic kidney disease, or unspecified chronic kidney disease: Secondary | ICD-10-CM | POA: Diagnosis present

## 2018-03-25 DIAGNOSIS — Z882 Allergy status to sulfonamides status: Secondary | ICD-10-CM

## 2018-03-25 HISTORY — DX: Acute kidney failure, unspecified: N17.9

## 2018-03-25 LAB — URINALYSIS, ROUTINE W REFLEX MICROSCOPIC
Bilirubin Urine: NEGATIVE
GLUCOSE, UA: 50 mg/dL — AB
HGB URINE DIPSTICK: NEGATIVE
Ketones, ur: NEGATIVE mg/dL
NITRITE: NEGATIVE
Protein, ur: NEGATIVE mg/dL
Specific Gravity, Urine: 1.008 (ref 1.005–1.030)
pH: 5 (ref 5.0–8.0)

## 2018-03-25 LAB — CBC WITH DIFFERENTIAL/PLATELET
ABS IMMATURE GRANULOCYTES: 0.01 10*3/uL (ref 0.00–0.07)
BASOS ABS: 0 10*3/uL (ref 0.0–0.1)
Basophils Relative: 1 %
EOS ABS: 0.2 10*3/uL (ref 0.0–0.5)
Eosinophils Relative: 4 %
HEMATOCRIT: 39.7 % (ref 36.0–46.0)
Hemoglobin: 12.4 g/dL (ref 12.0–15.0)
IMMATURE GRANULOCYTES: 0 %
Lymphocytes Relative: 29 %
Lymphs Abs: 1.3 10*3/uL (ref 0.7–4.0)
MCH: 28.8 pg (ref 26.0–34.0)
MCHC: 31.2 g/dL (ref 30.0–36.0)
MCV: 92.3 fL (ref 80.0–100.0)
MONOS PCT: 13 %
Monocytes Absolute: 0.6 10*3/uL (ref 0.1–1.0)
NEUTROS ABS: 2.3 10*3/uL (ref 1.7–7.7)
NEUTROS PCT: 53 %
Platelets: 226 10*3/uL (ref 150–400)
RBC: 4.3 MIL/uL (ref 3.87–5.11)
RDW: 14 % (ref 11.5–15.5)
WBC: 4.5 10*3/uL (ref 4.0–10.5)
nRBC: 0 % (ref 0.0–0.2)

## 2018-03-25 LAB — LIPASE, BLOOD: Lipase: 55 U/L — ABNORMAL HIGH (ref 11–51)

## 2018-03-25 LAB — COMPREHENSIVE METABOLIC PANEL
ALBUMIN: 4.2 g/dL (ref 3.5–5.0)
ALK PHOS: 76 U/L (ref 38–126)
ALT: 23 U/L (ref 0–44)
ANION GAP: 7 (ref 5–15)
AST: 41 U/L (ref 15–41)
BUN: 28 mg/dL — ABNORMAL HIGH (ref 8–23)
CALCIUM: 9.3 mg/dL (ref 8.9–10.3)
CHLORIDE: 108 mmol/L (ref 98–111)
CO2: 25 mmol/L (ref 22–32)
Creatinine, Ser: 2.06 mg/dL — ABNORMAL HIGH (ref 0.44–1.00)
GFR calc non Af Amer: 22 mL/min — ABNORMAL LOW (ref >60)
GFR, EST AFRICAN AMERICAN: 26 mL/min — AB (ref >60)
Glucose, Bld: 157 mg/dL — ABNORMAL HIGH (ref 70–99)
Potassium: 5.8 mmol/L — ABNORMAL HIGH (ref 3.5–5.1)
Sodium: 140 mmol/L (ref 135–145)
Total Bilirubin: 1 mg/dL (ref 0.3–1.2)
Total Protein: 8 g/dL (ref 6.5–8.1)

## 2018-03-25 LAB — GLUCOSE, CAPILLARY
Glucose-Capillary: 147 mg/dL — ABNORMAL HIGH (ref 70–99)
Glucose-Capillary: 198 mg/dL — ABNORMAL HIGH (ref 70–99)

## 2018-03-25 MED ORDER — HEPARIN SODIUM (PORCINE) 5000 UNIT/ML IJ SOLN
5000.0000 [IU] | Freq: Three times a day (TID) | INTRAMUSCULAR | Status: DC
  Administered 2018-03-25 – 2018-03-27 (×5): 5000 [IU] via SUBCUTANEOUS
  Filled 2018-03-25 (×6): qty 1

## 2018-03-25 MED ORDER — LIDOCAINE 5 % EX PTCH
1.0000 | MEDICATED_PATCH | CUTANEOUS | Status: DC
  Administered 2018-03-25 – 2018-03-26 (×2): 1 via TRANSDERMAL
  Filled 2018-03-25 (×3): qty 1

## 2018-03-25 MED ORDER — VITAMIN D 25 MCG (1000 UNIT) PO TABS
1000.0000 [IU] | ORAL_TABLET | Freq: Every day | ORAL | Status: DC
  Administered 2018-03-26 – 2018-03-27 (×2): 1000 [IU] via ORAL
  Filled 2018-03-25 (×2): qty 1

## 2018-03-25 MED ORDER — ASPIRIN EC 81 MG PO TBEC
81.0000 mg | DELAYED_RELEASE_TABLET | Freq: Every day | ORAL | Status: DC
  Administered 2018-03-25 – 2018-03-26 (×2): 81 mg via ORAL
  Filled 2018-03-25 (×2): qty 1

## 2018-03-25 MED ORDER — DONEPEZIL HCL 10 MG PO TABS
5.0000 mg | ORAL_TABLET | Freq: Every day | ORAL | Status: DC
  Administered 2018-03-25 – 2018-03-26 (×2): 5 mg via ORAL
  Filled 2018-03-25 (×3): qty 1

## 2018-03-25 MED ORDER — ALBUTEROL SULFATE (2.5 MG/3ML) 0.083% IN NEBU: 2.5000 mg | INHALATION_SOLUTION | RESPIRATORY_TRACT | Status: DC | PRN

## 2018-03-25 MED ORDER — INSULIN DEGLUDEC 200 UNIT/ML ~~LOC~~ SOPN: 50.0000 [IU] | PEN_INJECTOR | Freq: Every day | SUBCUTANEOUS | Status: DC

## 2018-03-25 MED ORDER — VITAMIN D 1000 UNITS PO TABS: 1000.0000 [IU] | ORAL_TABLET | Freq: Every day | ORAL | Status: DC

## 2018-03-25 MED ORDER — INSULIN GLARGINE 100 UNIT/ML ~~LOC~~ SOLN
50.0000 [IU] | Freq: Every day | SUBCUTANEOUS | Status: DC
  Filled 2018-03-25: qty 0.5

## 2018-03-25 MED ORDER — SODIUM POLYSTYRENE SULFONATE 15 GM/60ML PO SUSP: 15.0000 g | Freq: Once | ORAL | Status: DC

## 2018-03-25 MED ORDER — SODIUM CHLORIDE 0.9 % IV BOLUS
500.0000 mL | Freq: Once | INTRAVENOUS | Status: AC
  Administered 2018-03-25: 500 mL via INTRAVENOUS

## 2018-03-25 MED ORDER — INSULIN ASPART 100 UNIT/ML ~~LOC~~ SOLN
0.0000 [IU] | Freq: Three times a day (TID) | SUBCUTANEOUS | Status: DC
  Administered 2018-03-26: 7 [IU] via SUBCUTANEOUS
  Administered 2018-03-26 – 2018-03-27 (×2): 3 [IU] via SUBCUTANEOUS
  Administered 2018-03-27: 9 [IU] via SUBCUTANEOUS

## 2018-03-25 MED ORDER — ALBUTEROL 90 MCG/ACT IN AERS: 2.0000 | INHALATION_SPRAY | RESPIRATORY_TRACT | Status: DC | PRN

## 2018-03-25 MED ORDER — METOPROLOL SUCCINATE ER 50 MG PO TB24
100.0000 mg | ORAL_TABLET | Freq: Every day | ORAL | Status: DC
  Administered 2018-03-25 – 2018-03-26 (×2): 100 mg via ORAL
  Filled 2018-03-25 (×3): qty 2

## 2018-03-25 MED ORDER — SODIUM CHLORIDE 0.9 % IV SOLN
INTRAVENOUS | Status: AC
  Administered 2018-03-25: 19:00:00 via INTRAVENOUS

## 2018-03-25 MED ORDER — SODIUM CHLORIDE 0.9 % IV SOLN
1.0000 g | Freq: Once | INTRAVENOUS | Status: AC
  Administered 2018-03-25: 1 g via INTRAVENOUS
  Filled 2018-03-25: qty 10

## 2018-03-25 MED ORDER — GABAPENTIN 300 MG PO CAPS
300.0000 mg | ORAL_CAPSULE | Freq: Every day | ORAL | Status: DC
  Administered 2018-03-25 – 2018-03-26 (×2): 300 mg via ORAL
  Filled 2018-03-25 (×2): qty 1

## 2018-03-25 MED ORDER — METHOCARBAMOL 500 MG PO TABS
500.0000 mg | ORAL_TABLET | Freq: Three times a day (TID) | ORAL | Status: DC | PRN
  Administered 2018-03-26 – 2018-03-27 (×2): 500 mg via ORAL
  Filled 2018-03-25 (×2): qty 1

## 2018-03-25 MED ORDER — MIRABEGRON ER 25 MG PO TB24
50.0000 mg | ORAL_TABLET | Freq: Every day | ORAL | Status: DC
  Administered 2018-03-26 – 2018-03-27 (×2): 50 mg via ORAL
  Filled 2018-03-25 (×2): qty 2

## 2018-03-25 MED ORDER — ATORVASTATIN CALCIUM 10 MG PO TABS
10.0000 mg | ORAL_TABLET | Freq: Every morning | ORAL | Status: DC
  Administered 2018-03-26 – 2018-03-27 (×2): 10 mg via ORAL
  Filled 2018-03-25 (×2): qty 1

## 2018-03-25 MED ORDER — AMLODIPINE BESYLATE 5 MG PO TABS
5.0000 mg | ORAL_TABLET | Freq: Every day | ORAL | Status: DC
  Administered 2018-03-26 – 2018-03-27 (×2): 5 mg via ORAL
  Filled 2018-03-25 (×2): qty 1

## 2018-03-25 NOTE — H&P (Signed)
History and Physical    [name] DOB: 01-Jun-1940 DOA: 03/25/2018  I have briefly reviewed the patient's prior medical records in [place]  PCP: [name], MD  Patient coming from: Home  Chief Complaint: Right flank pain  HPI: [name] is a 78 y.o. female with medical history significant of kidney cancer status post right-sided nephrectomy as well as partial left nephrectomy, diabetes mellitus, hypertension, hyperlipidemia, presents to the hospital with chief complaint of right flank pain.  Patient tells me that this is been going on for the past month however in the last few days things have gotten a lot worse.  She tells me that the pain is worse when she is laying in bed and not moving around, especially when she wakes up in the morning, and as she starts getting up her pain slowly subsides with more activity during the day.  She reports the pain as being sharp, left lateral flank/right lower groin.  She denies any dysuria, denies any fever or chills, denies any increased frequency.  She is otherwise feeling at baseline, has no other symptoms, no chest pain, no shortness of breath, no palpitations.  She is concerned about the cancer coming back.  ED Course: In the emergency room her vital signs are stable, her blood work remarkable for a potassium of 5.8, BUN 28 creatinine 2.06, CBC is unremarkable.  Urinalysis shows rare bacteria and 11-20 WBCs.  It also showed moderate leukocytes.  She underwent a CT scan of the abdomen and pelvis which showed no evidence of tumor recurrence, mild diverticulosis and a very small supraumbilical incisional hernia containing fat.  She was given ceftriaxone for presumed UTI and we are asked to admit  Review of Systems: As per HPI otherwise 10 point review of systems negative.   Past Medical History:  Diagnosis Date  . Anginal pain ([place])    just occurs without any specific trigger; pt states has never had to use nitroglycerin  tabs; uses rest to relieve  . Arthritis   . Asthma   . Diabetes mellitus    Type 1  . Dysrhythmia    "irregular heart beat"  . GERD (gastroesophageal reflux disease)   . History of urinary tract infection   . Hypercholesteremia   . Hypertension   . left renal ca dx'd 2012 (?)   surg only, left kidney  . Nocturia   . Reflux   . Renal failure (ARF), acute on chronic (HCC)   . Shortness of breath dyspnea    pt denies; states can climb stairs w/o difficulty   . Sleep apnea    does not use c-pap machine  . Tingling in extremities    legs bilat  . Tinnitus   . Urinary frequency   . Urinary incontinence   . Vertigo    occurs when lying flat     Past Surgical History:  Procedure Laterality Date  . ABDOMINAL HYSTERECTOMY    . APPENDECTOMY    . CARDIAC CATHETERIZATION N/A 07/28/2014   Procedure: Left Heart Cath and Coronary Angiography;  Surgeon: [name], MD;  Location: [facility];  Service: Cardiovascular;  Laterality: N/A;  . CYSTOSCOPY WITH RETROGRADE PYELOGRAM, URETEROSCOPY AND STENT PLACEMENT Right 02/09/2015   Procedure: CYSTOSCOPY WITH RETROGRADE PYELOGRAM AND URETEROSCOPY ;  Surgeon: Raynelle Bring, MD;  Location: [facility];  Service: Urology;  Laterality: Right;  . LAPAROSCOPIC NEPHRECTOMY Right 03/20/2015   Procedure: LAPAROSCOPIC NEPHRECTOMY;  Surgeon: Raynelle Bring, MD;  Location:  [facility];  Service: Urology;  Laterality: Right;  . PERIPHERAL VASCULAR CATHETERIZATION N/A 07/28/2014   Procedure: Aortic Arch Angiography;  Surgeon: [name], MD;  Location: [facility];  Service: Cardiovascular;  Laterality: N/A;  . RENAL MASS EXCISION    . SPINE SURGERY       reports that she has never smoked. She has never used smokeless tobacco. She reports that she does not drink alcohol or use drugs.  Allergies  Allergen Reactions  . Sulfa Antibiotics Itching    Family History  Problem Relation Age of Onset  . Hypertension Mother   . Coronary artery disease Other      Prior to Admission medications   Medication Sig Start Date End Date Taking? Authorizing Provider  albuterol (PROVENTIL,VENTOLIN) 90 MCG/ACT inhaler Inhale 2 puffs into the lungs every 4 (four) hours as needed for wheezing or shortness of breath.    Yes [provider]  amLODipine (NORVASC) 5 MG tablet Take 5 mg by mouth daily.    Yes [provider]  aspirin EC 81 MG tablet Take 81 mg by mouth at bedtime.   Yes [provider]  atorvastatin (LIPITOR) 10 MG tablet Take 10 mg by mouth every morning.    Yes [provider]  cholecalciferol (VITAMIN D) 1000 units tablet Take 1,000 Units by mouth daily.   Yes [provider]  donepezil (ARICEPT) 5 MG tablet Take 5 mg by mouth daily. 03/29/17  Yes [provider]  gabapentin (NEURONTIN) 300 MG capsule Take 1 capsule (300 mg total) by mouth at bedtime. 07/16/16  Yes Regal, Tamala Fothergill, DPM  Insulin Degludec (TRESIBA FLEXTOUCH) 200 UNIT/ML SOPN Inject 74 Units into the skin daily before breakfast.    Yes [provider]  metoprolol succinate (TOPROL-XL) 100 MG 24 hr tablet Take 100 mg by mouth daily. Take with or immediately following a meal.   Yes [provider]  mirabegron ER (MYRBETRIQ) 50 MG TB24 tablet Take 50 mg by mouth daily.   Yes [provider]  Multiple Vitamins-Minerals (CENTRUM SILVER ADULT 50+ PO) Take 1 tablet by mouth daily.   Yes [provider]  TRADJENTA 5 MG TABS tablet Take 5 mg by mouth daily. 05/11/17  Yes [provider]  nitroGLYCERIN (NITROSTAT) 0.4 MG SL tablet Place 0.4 mg under the tongue every 5 (five) minutes as needed for chest pain.    [provider]    Physical Exam: Vitals:   03/25/18 1212 03/25/18 1412 03/25/18 1430 03/25/18 1538  BP: (!) 104/93 (!) 108/92 (!) 156/77 (!) 155/83  Pulse: 67 73 70 71  Resp: 15 18 18 17   Temp:      TempSrc:      SpO2: 98% 100% 98% 100%  Weight:      Height:         Constitutional: NAD, calm, comfortable Eyes: PERRL, lids and conjunctivae normal ENMT: Mucous membranes are moist.  Neck: normal, supple Respiratory: clear to auscultation bilaterally, no wheezing, no crackles. Normal respiratory effort. No accessory muscle use.  Cardiovascular: Regular rate and rhythm, no murmurs / rubs / gallops. No extremity edema. 2+ pedal pulses.  Abdomen: Tender to deep palpation right lower quadrant, no rebound or guarding.  Positive CVA tenderness on right Musculoskeletal: no clubbing / cyanosis. Normal muscle tone.  Skin: no rashes Neurologic: CN 2-12 grossly intact. Strength 5/5 in all 4.  Psychiatric: Normal judgment and insight. Alert and oriented x 3. Normal mood.   Labs on  Admission: I have personally reviewed following labs and imaging studies  CBC: Recent Labs  Lab 03/25/18 1202  WBC 4.5  NEUTROABS 2.3  HGB 12.4  HCT 39.7  MCV 92.3  PLT 579   Basic Metabolic Panel: Recent Labs  Lab 03/25/18 1202  NA 140  K 5.8*  CL 108  CO2 25  GLUCOSE 157*  BUN 28*  CREATININE 2.06*  CALCIUM 9.3   GFR: Estimated Creatinine Clearance: 18.7 mL/min (A) (by C-G formula based on SCr of 2.06 mg/dL (H)). Liver Function Tests: Recent Labs  Lab 03/25/18 1202  AST 41  ALT 23  ALKPHOS 76  BILITOT 1.0  PROT 8.0  ALBUMIN 4.2   Recent Labs  Lab 03/25/18 1202  LIPASE 55*   No results for input(s): AMMONIA in the last 168 hours. Coagulation Profile: No results for input(s): INR, PROTIME in the last 168 hours. Cardiac Enzymes: No results for input(s): CKTOTAL, CKMB, CKMBINDEX, TROPONINI in the last 168 hours. BNP (last 3 results) No results for input(s): PROBNP in the last 8760 hours. HbA1C: No results for input(s): HGBA1C in the last 72 hours. CBG: No results for input(s): GLUCAP in the last 168 hours. Lipid Profile: No results for input(s): CHOL, HDL, LDLCALC, TRIG, CHOLHDL, LDLDIRECT in the last 72 hours. Thyroid Function Tests: No  results for input(s): TSH, T4TOTAL, FREET4, T3FREE, THYROIDAB in the last 72 hours. Anemia Panel: No results for input(s): VITAMINB12, FOLATE, FERRITIN, TIBC, IRON, RETICCTPCT in the last 72 hours. Urine analysis:    Component Value Date/Time   COLORURINE STRAW (A) 03/25/2018 1218   APPEARANCEUR CLEAR 03/25/2018 1218   LABSPEC 1.008 03/25/2018 1218   PHURINE 5.0 03/25/2018 1218   GLUCOSEU 50 (A) 03/25/2018 1218   HGBUR NEGATIVE 03/25/2018 [place] 03/25/2018 [place] 03/25/2018 1218   PROTEINUR NEGATIVE 03/25/2018 1218   UROBILINOGEN 0.2 12/26/2014 1220   NITRITE NEGATIVE 03/25/2018 1218   LEUKOCYTESUR MODERATE (A) 03/25/2018 1218     Radiological Exams on Admission: Ct Abdomen Pelvis Wo Contrast  Result Date: 03/25/2018 CLINICAL DATA:  Right flank pain the past month. Status post right nephrectomy on 03/20/2015 for renal cell carcinoma and partial left nephrectomy. EXAM: CT ABDOMEN AND PELVIS WITHOUT CONTRAST TECHNIQUE: Multidetector CT imaging of the abdomen and pelvis was performed following the standard protocol without IV contrast. COMPARISON:  Abdomen MR dated 01/08/2018 and 10/09/2017 and abdomen and pelvis CT dated 06/01/2017. FINDINGS: Lower chest: Clear lung bases. Hepatobiliary: Poorly distended gallbladder. Normal appearing liver. Pancreas: Unremarkable. No pancreatic ductal dilatation or surrounding inflammatory changes. Spleen: Normal in size without focal abnormality. Adrenals/Urinary Tract: Normal appearing adrenal glands. Surgically absent right kidney and stable surgical absence of a portion of the left kidney. No left renal mass visualized. Unremarkable urinary bladder and left ureter. Stomach/Bowel: Small number of colonic diverticula. Surgically absent appendix. Unremarkable stomach and small bowel. Vascular/Lymphatic: Mild atheromatous arterial calcifications. No enlarged lymph nodes. Reproductive: Status post hysterectomy. No adnexal  masses. Other: Midline surgical scar with a small supraumbilical incisional hernia containing fat. There is also a small umbilical hernia containing fat. Transversely oriented linear areas of increased density are again demonstrated just beneath the skin in the lower abdomen bilaterally. These an appearance suggesting scar formation. Musculoskeletal: Lumbar and lower thoracic spine degenerative changes. No evidence of bony metastatic disease. IMPRESSION: 1. Status post right nephrectomy and partial left nephrectomy with no evidence of tumor recurrence. 2. Mild colonic diverticulosis. 3. Small supraumbilical incisional hernia containing fat and  small umbilical hernia containing fat. Electronically Signed   By: Claudie Revering M.D.   On: 03/25/2018 13:32    Assessment/Plan Active Problems:   AKI (acute kidney injury) ([place])   Principal Problem Mild elevation in creatinine in the setting of chronic kidney disease stage IV -Her most recent creatinine in 2019 was 1.7, currently at 2.0, slightly up.  She does have baseline creatinine over the last couple years around 2 so this may not be far off to baseline -Hydrate overnight, repeat renal function in the morning -She is also hyperkalemic now which is new.  Will treat with Kayexalate and repeat potassium in the morning  Active Problems Right-sided flank pain /history of right-sided nephrectomy and partial left nephrectomy -This is been going on for a month, this is very unlikely that is pyelonephritis as she had a total nephrectomy on the right side.  She has bacteriuria however denies any UTI type symptoms for me, no dysuria, no lower abdominal pain, no fevers, she has no leukocytosis.  She was given ceftriaxone in the ED and urine cultures were sent.  I would favor observing off antibiotics while awaiting for the urine cultures -She normally follows with Dr. Alinda Money as an outpatient, she tells me that he saw her about a month ago and she was having the flank  pain at that time.  Type 2 diabetes mellitus -Insulin-dependent, resume home long-acting at a lower dose as well as sliding scale  Hypertension/hyperlipidemia -Continue home medications   DVT prophylaxis: heparin  Code Status: Full code  Family Communication: No family present at bedside Disposition Plan: Home when ready, likely within 24 hours Consults called: None   Marzetta Board, MD, PhD [facility]  Contact via [email or web address]  [facility] Office Info P: [phone]  F: [phone]   03/25/2018, 4:34 PM

## 2018-03-25 NOTE — Progress Notes (Signed)
Called for report from [place]. B ,RN  at [phone] , was told she is giving blood and will call  back within 10 mins

## 2018-03-25 NOTE — ED Notes (Signed)
Pure wick has been placed. Pt has been informed. Suction set to 45 mmHg

## 2018-03-25 NOTE — ED Triage Notes (Signed)
Pt states she had 2/3 of her right kidney removed 2 years ago. Pt states that she has been having right sided flank pain for 1 month. Pt reports no issues voiding.

## 2018-03-25 NOTE — ED Provider Notes (Signed)
[facility] DEPT Provider Note   CSN: [id number] Arrival date & time: 03/25/18  1014     History   Chief Complaint Chief Complaint  Patient presents with  . Flank Pain    HPI [name] is a 78 y.o. female with history of renal cancer with total right nephrectomy and left heminephrectomy presenting for right flank pain.  Patient reports right flank pain for approximately 1 month.  She describes it as a moderate in intensity "crawling sensation "that has been constant since time of onset.  She denies aggravating or alleviating factors.  She reports speaking to her surgeon about this problem however states that he did not seem concerned.  Patient denies hematuria, dysuria, fever, nausea/vomiting or any additional concerns today.  HPI  Past Medical History:  Diagnosis Date  . Anginal pain ([place])    just occurs without any specific trigger; pt states has never had to use nitroglycerin tabs; uses rest to relieve  . Arthritis   . Asthma   . Diabetes mellitus    Type 1  . Dysrhythmia    "irregular heart beat"  . GERD (gastroesophageal reflux disease)   . History of urinary tract infection   . Hypercholesteremia   . Hypertension   . left renal ca dx'd 2012 (?)   surg only, left kidney  . Nocturia   . Reflux   . Renal failure (ARF), acute on chronic (HCC)   . Shortness of breath dyspnea    pt denies; states can climb stairs w/o difficulty   . Sleep apnea    does not use c-pap machine  . Tingling in extremities    legs bilat  . Tinnitus   . Urinary frequency   . Urinary incontinence   . Vertigo    occurs when lying flat     Patient Active Problem List   Diagnosis Date Noted  . AKI (acute kidney injury) ([place]) 03/25/2018  . Left shoulder pain   . Limited joint range of motion (ROM)   . Chest pain 10/23/2015  . Diabetes mellitus type 2 in obese ([place]) 10/23/2015  . Renal neoplasm 03/20/2015  . Right renal mass   . Chest pain with  high risk for cardiac etiology 07/28/2014  . ACS (acute coronary syndrome) ([place]) 12/27/2010  . Diabetes mellitus 12/27/2010  . Hypertension 12/27/2010  . Hyperlipidemia 12/27/2010  . GERD (gastroesophageal reflux disease) 12/27/2010  . Renal cell adenocarcinoma ([place]) 12/27/2010  . Arthritis 12/27/2010  . Cervical stenosis of spine 12/27/2010  . Carpal tunnel syndrome 12/27/2010  . Asthma 12/27/2010    Past Surgical History:  Procedure Laterality Date  . ABDOMINAL HYSTERECTOMY    . APPENDECTOMY    . CARDIAC CATHETERIZATION N/A 07/28/2014   Procedure: Left Heart Cath and Coronary Angiography;  Surgeon: [name], MD;  Location: [facility];  Service: Cardiovascular;  Laterality: N/A;  . CYSTOSCOPY WITH RETROGRADE PYELOGRAM, URETEROSCOPY AND STENT PLACEMENT Right 02/09/2015   Procedure: CYSTOSCOPY WITH RETROGRADE PYELOGRAM AND URETEROSCOPY ;  Surgeon: Raynelle Bring, MD;  Location: [facility];  Service: Urology;  Laterality: Right;  . LAPAROSCOPIC NEPHRECTOMY Right 03/20/2015   Procedure: LAPAROSCOPIC NEPHRECTOMY;  Surgeon: Raynelle Bring, MD;  Location: [facility];  Service: Urology;  Laterality: Right;  . PERIPHERAL VASCULAR CATHETERIZATION N/A 07/28/2014   Procedure: Aortic Arch Angiography;  Surgeon: [name], MD;  Location: [facility];  Service: Cardiovascular;  Laterality: N/A;  . RENAL MASS EXCISION    . SPINE SURGERY  OB History   No obstetric history on file.      Home Medications    Prior to Admission medications   Medication Sig Start Date End Date Taking? Authorizing Provider  albuterol (PROVENTIL,VENTOLIN) 90 MCG/ACT inhaler Inhale 2 puffs into the lungs every 4 (four) hours as needed for wheezing or shortness of breath.    Yes [provider]  amLODipine (NORVASC) 5 MG tablet Take 5 mg by mouth daily.    Yes [provider]  aspirin EC 81 MG tablet Take 81 mg by mouth at bedtime.   Yes [provider]  atorvastatin (LIPITOR) 10 MG  tablet Take 10 mg by mouth every morning.    Yes [provider]  cholecalciferol (VITAMIN D) 1000 units tablet Take 1,000 Units by mouth daily.   Yes [provider]  donepezil (ARICEPT) 5 MG tablet Take 5 mg by mouth daily. 03/29/17  Yes [provider]  gabapentin (NEURONTIN) 300 MG capsule Take 1 capsule (300 mg total) by mouth at bedtime. 07/16/16  Yes Regal, Tamala Fothergill, DPM  Insulin Degludec (TRESIBA FLEXTOUCH) 200 UNIT/ML SOPN Inject 74 Units into the skin daily before breakfast.    Yes [provider]  metoprolol succinate (TOPROL-XL) 100 MG 24 hr tablet Take 100 mg by mouth daily. Take with or immediately following a meal.   Yes [provider]  mirabegron ER (MYRBETRIQ) 50 MG TB24 tablet Take 50 mg by mouth daily.   Yes [provider]  Multiple Vitamins-Minerals (CENTRUM SILVER ADULT 50+ PO) Take 1 tablet by mouth daily.   Yes [provider]  TRADJENTA 5 MG TABS tablet Take 5 mg by mouth daily. 05/11/17  Yes [provider]  nitroGLYCERIN (NITROSTAT) 0.4 MG SL tablet Place 0.4 mg under the tongue every 5 (five) minutes as needed for chest pain.    [provider]    Family History Family History  Problem Relation Age of Onset  . Hypertension Mother   . Coronary artery disease Other     Social History Social History   Tobacco Use  . Smoking status: Never Smoker  . Smokeless tobacco: Never Used  Substance Use Topics  . Alcohol use: No  . Drug use: No    Frequency: 1.0 times per week     Allergies   Sulfa antibiotics   Review of Systems Review of Systems  Constitutional: Negative.  Negative for chills and fever.  Respiratory: Negative.  Negative for cough and shortness of breath.   Cardiovascular: Negative.  Negative for chest pain.  Gastrointestinal: Negative.  Negative for abdominal pain, blood in stool, diarrhea, nausea and vomiting.  Genitourinary: Positive for flank pain. Negative for  dysuria and hematuria.  Neurological: Negative.  Negative for weakness and headaches.  All other systems reviewed and are negative.  Physical Exam Updated Vital Signs BP (!) 156/77   Pulse 70   Temp 98.4 F (36.9 C) (Oral)   Resp 18   Ht 4\' 11"  (1.499 m)   Wt 68.9 kg   SpO2 98%   BMI 30.68 kg/m   Physical Exam Constitutional:      General: She is not in acute distress.    Appearance: Normal appearance. She is well-developed. She is not ill-appearing or diaphoretic.  HENT:     Head: Normocephalic and atraumatic.     Right Ear: External ear normal.     Left Ear: External ear normal.     Nose: Nose normal.  Eyes:  Pupils: Pupils are equal, round, and reactive to light.  Neck:     Musculoskeletal: Normal range of motion.     Trachea: Trachea normal. No tracheal deviation.  Cardiovascular:     Rate and Rhythm: Normal rate and regular rhythm.     Pulses: Normal pulses.          Dorsalis pedis pulses are 2+ on the right side and 2+ on the left side.       Posterior tibial pulses are 2+ on the right side and 2+ on the left side.     Heart sounds: Normal heart sounds.  Pulmonary:     Effort: Pulmonary effort is normal. No respiratory distress.     Breath sounds: Normal breath sounds.  Abdominal:     General: Bowel sounds are normal. There is no distension.     Palpations: Abdomen is soft.     Tenderness: There is no abdominal tenderness. There is right CVA tenderness. There is no left CVA tenderness, guarding or rebound.  Musculoskeletal: Normal range of motion.     Right lower leg: Normal. She exhibits no tenderness. No edema.     Left lower leg: Normal. She exhibits no tenderness. No edema.  Feet:     Right foot:     Protective Sensation: 3 sites tested. 3 sites sensed.     Left foot:     Protective Sensation: 3 sites tested. 3 sites sensed.  Skin:    General: Skin is warm and dry.     Capillary Refill: Capillary refill takes less than 2 seconds.  Neurological:      General: No focal deficit present.     Mental Status: She is alert and oriented to person, place, and time.     GCS: GCS eye subscore is 4. GCS verbal subscore is 5. GCS motor subscore is 6.     Comments: Speech is clear and goal oriented, follows commands Major Cranial nerves without deficit, no facial droop Normal strength in upper and lower extremities bilaterally including dorsiflexion and plantar flexion, strong and equal grip strength Sensation normal to light touch Moves extremities without ataxia, coordination intact Normal gait  Psychiatric:        Mood and Affect: Mood normal.        Behavior: Behavior normal.    ED Treatments / Results  Labs (all labs ordered are listed, but only abnormal results are displayed) Labs Reviewed  COMPREHENSIVE METABOLIC PANEL - Abnormal; Notable for the following components:      Result Value   Potassium 5.8 (*)    Glucose, Bld 157 (*)    BUN 28 (*)    Creatinine, Ser 2.06 (*)    GFR calc non Af Amer 22 (*)    GFR calc Af Amer 26 (*)    All other components within normal limits  LIPASE, BLOOD - Abnormal; Notable for the following components:   Lipase 55 (*)    All other components within normal limits  URINALYSIS, ROUTINE W REFLEX MICROSCOPIC - Abnormal; Notable for the following components:   Color, Urine STRAW (*)    Glucose, UA 50 (*)    Leukocytes, UA MODERATE (*)    Bacteria, UA RARE (*)    All other components within normal limits  URINE CULTURE  CBC WITH DIFFERENTIAL/PLATELET    EKG None  Radiology Ct Abdomen Pelvis Wo Contrast  Result Date: 03/25/2018 CLINICAL DATA:  Right flank pain the past month. Status post right nephrectomy on 03/20/2015 for  renal cell carcinoma and partial left nephrectomy. EXAM: CT ABDOMEN AND PELVIS WITHOUT CONTRAST TECHNIQUE: Multidetector CT imaging of the abdomen and pelvis was performed following the standard protocol without IV contrast. COMPARISON:  Abdomen MR dated 01/08/2018 and 10/09/2017  and abdomen and pelvis CT dated 06/01/2017. FINDINGS: Lower chest: Clear lung bases. Hepatobiliary: Poorly distended gallbladder. Normal appearing liver. Pancreas: Unremarkable. No pancreatic ductal dilatation or surrounding inflammatory changes. Spleen: Normal in size without focal abnormality. Adrenals/Urinary Tract: Normal appearing adrenal glands. Surgically absent right kidney and stable surgical absence of a portion of the left kidney. No left renal mass visualized. Unremarkable urinary bladder and left ureter. Stomach/Bowel: Small number of colonic diverticula. Surgically absent appendix. Unremarkable stomach and small bowel. Vascular/Lymphatic: Mild atheromatous arterial calcifications. No enlarged lymph nodes. Reproductive: Status post hysterectomy. No adnexal masses. Other: Midline surgical scar with a small supraumbilical incisional hernia containing fat. There is also a small umbilical hernia containing fat. Transversely oriented linear areas of increased density are again demonstrated just beneath the skin in the lower abdomen bilaterally. These an appearance suggesting scar formation. Musculoskeletal: Lumbar and lower thoracic spine degenerative changes. No evidence of bony metastatic disease. IMPRESSION: 1. Status post right nephrectomy and partial left nephrectomy with no evidence of tumor recurrence. 2. Mild colonic diverticulosis. 3. Small supraumbilical incisional hernia containing fat and small umbilical hernia containing fat. Electronically Signed   By: Claudie Revering M.D.   On: 03/25/2018 13:32    Procedures Procedures (including critical care time)  Medications Ordered in ED Medications  Insulin Degludec SOPN 50 Units (has no administration in time range)  methocarbamol (ROBAXIN) tablet 500 mg (has no administration in time range)  sodium polystyrene (KAYEXALATE) 15 GM/60ML suspension 15 g (0 g Oral Hold 03/25/18 1522)  cefTRIAXone (ROCEPHIN) 1 g in sodium chloride 0.9 % 100 mL IVPB (1  g Intravenous New Bag/Given 03/25/18 1536)  sodium chloride 0.9 % bolus 500 mL (0 mLs Intravenous Stopped 03/25/18 1415)     Initial Impression / Assessment and Plan / ED Course  I have reviewed the triage vital signs and the nursing notes.  Pertinent labs & imaging results that were available during my care of the patient were reviewed by me and considered in my medical decision making (see chart for details).  Clinical Course as of Mar 25 1536  Wed Mar 25, 2018  1520 Rocephin; Baruch Goldmann, [place]   [BM]    Clinical Course User Index [BM] Deliah Boston, [place]   78 year old female with history of complete right nephrectomy and partial left nephrectomy presenting for 1 month of right flank pain/abnormal sensation.  No history of infectious symptoms.  CBC within normal limits Lipase 55 CMP with baseline creatinine of 2.06, potassium of 5.8 Urinalysis with moderate leukocytes, 11-20 white blood cells, bacteria present Urine culture sent Fluid bolus given CT abdomen pelvis:  IMPRESSION:  1. Status post right nephrectomy and partial left nephrectomy with  no evidence of tumor recurrence.  2. Mild colonic diverticulosis.  3. Small supraumbilical incisional hernia containing fat and small  umbilical hernia containing fat.  ----------------- Case discussed with Dr. Venora Maples, will seek admission for UTI/possible pyelo in single kidney patient. ------------------ Consult called to hospitalist service for admission, they request that we begin patient on antibiotic as well as hyperkalemic medication. ------------------ Patient reassessed and updated of care plan.  She is resting comfortably no acute distress.  Vital signs stable.  She is agreeable for admission. ------------------ Discussed case with pharmacist Nicki Reaper, recommends IV  Rocephin for UTI/Pyelo in addition to [facility] for hyperkalemia.  I have ordered the Rocephin here in the ED, hospitalist has ordered Kayexalate so I did not  place order for [facility]. ----------------- Patient care has been taken over by hospitalist service.   Note: Portions of this report may have been transcribed using voice recognition software. Every effort was made to ensure accuracy; however, inadvertent computerized transcription errors may still be present. Final Clinical Impressions(s) / ED Diagnoses   Final diagnoses:  Pyelonephritis  Single kidney    ED Discharge Orders    None       Gari Crown 03/25/18 1543    Jola Schmidt, MD 03/25/18 1714

## 2018-03-26 ENCOUNTER — Observation Stay (HOSPITAL_COMMUNITY): Payer: Medicare Other

## 2018-03-26 DIAGNOSIS — E1122 Type 2 diabetes mellitus with diabetic chronic kidney disease: Secondary | ICD-10-CM | POA: Diagnosis present

## 2018-03-26 DIAGNOSIS — N179 Acute kidney failure, unspecified: Secondary | ICD-10-CM | POA: Diagnosis present

## 2018-03-26 DIAGNOSIS — I129 Hypertensive chronic kidney disease with stage 1 through stage 4 chronic kidney disease, or unspecified chronic kidney disease: Secondary | ICD-10-CM | POA: Diagnosis present

## 2018-03-26 DIAGNOSIS — N184 Chronic kidney disease, stage 4 (severe): Secondary | ICD-10-CM | POA: Diagnosis present

## 2018-03-26 DIAGNOSIS — Z7982 Long term (current) use of aspirin: Secondary | ICD-10-CM | POA: Diagnosis not present

## 2018-03-26 DIAGNOSIS — Z8249 Family history of ischemic heart disease and other diseases of the circulatory system: Secondary | ICD-10-CM | POA: Diagnosis not present

## 2018-03-26 DIAGNOSIS — Z905 Acquired absence of kidney: Secondary | ICD-10-CM | POA: Diagnosis not present

## 2018-03-26 DIAGNOSIS — Z87440 Personal history of urinary (tract) infections: Secondary | ICD-10-CM | POA: Diagnosis not present

## 2018-03-26 DIAGNOSIS — Z882 Allergy status to sulfonamides status: Secondary | ICD-10-CM | POA: Diagnosis not present

## 2018-03-26 DIAGNOSIS — N12 Tubulo-interstitial nephritis, not specified as acute or chronic: Secondary | ICD-10-CM | POA: Diagnosis present

## 2018-03-26 DIAGNOSIS — E785 Hyperlipidemia, unspecified: Secondary | ICD-10-CM | POA: Diagnosis present

## 2018-03-26 DIAGNOSIS — R8271 Bacteriuria: Secondary | ICD-10-CM | POA: Diagnosis present

## 2018-03-26 DIAGNOSIS — E875 Hyperkalemia: Secondary | ICD-10-CM | POA: Diagnosis present

## 2018-03-26 DIAGNOSIS — Z85528 Personal history of other malignant neoplasm of kidney: Secondary | ICD-10-CM | POA: Diagnosis not present

## 2018-03-26 DIAGNOSIS — M47816 Spondylosis without myelopathy or radiculopathy, lumbar region: Secondary | ICD-10-CM | POA: Diagnosis present

## 2018-03-26 DIAGNOSIS — Z794 Long term (current) use of insulin: Secondary | ICD-10-CM | POA: Diagnosis not present

## 2018-03-26 LAB — COMPREHENSIVE METABOLIC PANEL
ALT: 17 U/L (ref 0–44)
ANION GAP: 5 (ref 5–15)
AST: 14 U/L — ABNORMAL LOW (ref 15–41)
Albumin: 3.3 g/dL — ABNORMAL LOW (ref 3.5–5.0)
Alkaline Phosphatase: 60 U/L (ref 38–126)
BILIRUBIN TOTAL: 0.2 mg/dL — AB (ref 0.3–1.2)
BUN: 28 mg/dL — ABNORMAL HIGH (ref 8–23)
CO2: 22 mmol/L (ref 22–32)
Calcium: 8.9 mg/dL (ref 8.9–10.3)
Chloride: 115 mmol/L — ABNORMAL HIGH (ref 98–111)
Creatinine, Ser: 1.83 mg/dL — ABNORMAL HIGH (ref 0.44–1.00)
GFR calc Af Amer: 30 mL/min — ABNORMAL LOW (ref >60)
GFR calc non Af Amer: 26 mL/min — ABNORMAL LOW (ref >60)
Glucose, Bld: 83 mg/dL (ref 70–99)
Potassium: 3.9 mmol/L (ref 3.5–5.1)
Sodium: 142 mmol/L (ref 135–145)
TOTAL PROTEIN: 6.6 g/dL (ref 6.5–8.1)

## 2018-03-26 LAB — CBC
HCT: 36.3 % (ref 36.0–46.0)
Hemoglobin: 11.4 g/dL — ABNORMAL LOW (ref 12.0–15.0)
MCH: 29.8 pg (ref 26.0–34.0)
MCHC: 31.4 g/dL (ref 30.0–36.0)
MCV: 94.8 fL (ref 80.0–100.0)
Platelets: 190 10*3/uL (ref 150–400)
RBC: 3.83 MIL/uL — ABNORMAL LOW (ref 3.87–5.11)
RDW: 14 % (ref 11.5–15.5)
WBC: 5.3 10*3/uL (ref 4.0–10.5)
nRBC: 0 % (ref 0.0–0.2)

## 2018-03-26 LAB — URINE CULTURE: CULTURE: NO GROWTH

## 2018-03-26 LAB — GLUCOSE, CAPILLARY
Glucose-Capillary: 80 mg/dL (ref 70–99)
Glucose-Capillary: 208 mg/dL — ABNORMAL HIGH (ref 70–99)
Glucose-Capillary: 323 mg/dL — ABNORMAL HIGH (ref 70–99)
Glucose-Capillary: 249 mg/dL — ABNORMAL HIGH (ref 70–99)

## 2018-03-26 MED ORDER — HYDROCODONE-ACETAMINOPHEN 5-325 MG PO TABS
1.0000 | ORAL_TABLET | ORAL | Status: DC | PRN
  Administered 2018-03-26 – 2018-03-27 (×2): 1 via ORAL
  Filled 2018-03-26 (×2): qty 1

## 2018-03-26 MED ORDER — DEXAMETHASONE SODIUM PHOSPHATE 4 MG/ML IJ SOLN
4.0000 mg | Freq: Two times a day (BID) | INTRAMUSCULAR | Status: DC
  Administered 2018-03-26 – 2018-03-27 (×3): 4 mg via INTRAVENOUS
  Filled 2018-03-26 (×3): qty 1

## 2018-03-26 MED ORDER — INSULIN GLARGINE 100 UNIT/ML ~~LOC~~ SOLN
30.0000 [IU] | Freq: Every day | SUBCUTANEOUS | Status: DC
  Administered 2018-03-26 – 2018-03-27 (×2): 30 [IU] via SUBCUTANEOUS
  Filled 2018-03-26: qty 0.3

## 2018-03-26 NOTE — Progress Notes (Addendum)
[facility] PROGRESS NOTE  [name] HGD:[id number] DOB: Aug 30, 1940 DOA: 03/25/2018   PCP: [name], MD     Assessment/Plan: Active Problems:   AKI (acute kidney injury) [facility])  78 y.o. female with medical history significant of kidney cancer status post right-sided nephrectomy as well as partial left nephrectomy, diabetes mellitus, hypertension, hyperlipidemia, presents to the hospital with chief complaint of right flank pain.    pain is worse when she is laying in bed and not moving around, especially when she wakes up in the morning, and as she starts getting up her pain slowly subsides with more activity during the day.  She reports the pain as being sharp, left lateral flank/right lower groin.  She denies any dysuria, denies any fever or chills, denies any increased frequency.     She underwent a CT scan of the abdomen and pelvis which showed no evidence of tumor recurrence, mild diverticulosis and a very small supraumbilical incisional hernia containing fat.  She was given ceftriaxone for presumed UTI and we are asked to admit  Assessment/Plan Mild elevation in creatinine in the setting of chronic kidney disease stage IV -Her most recent creatinine in 2019 was 1.7, currently at 2.0, slightly improved to 1.83.   -Hydrate overnight, repeat renal function in the morning -hyperkalemia  Has improved  With IVF .  s/p Kayexalate and repeat potassium in the morning , k 3.9    Right-sided flank pain /history of right-sided nephrectomy and partial left nephrectomy This is been going on for a month, this is very unlikely that is pyelonephritis as she had a total nephrectomy on the right side.  She has bacteriuria however denies any UTI type symptoms for me, no dysuria, no lower abdominal pain, no fevers, she has no leukocytosis.  She was given ceftriaxone in the ED and urine cultures were sent.  , favor observing off antibiotics while awaiting for the urine cultures -She  normally follows with Dr. Alinda Money as an outpatient, she tells me that he saw her about a month ago and she was having the flank pain at that time.She had an abdominal MRI in 11/19 which was within normal limits . No findings to suggest residual or recurrence of tumor status post right nephrectomy and left partial nephrectomy. Will order MRI thoracic /lumabr spine today to r/o diskitis, disc prolapse  Patient also started on Vicodin and Decadron for pain control. Suspect her pain is mostly musculoskeletal and she would need steroids for treating acute flare [CT abdomen pelvis did show Lumbar and lower thoracic spine degenerative changes. No evidence of bony metastatic disease)  Type 2 diabetes mellitus -Insulin-dependent, resume home long-acting at a lower dose as well as sliding scale, substitute lantus for tresiba   Hypertension/hyperlipidemia -Continue home medications   DVT prophylaxsis heparin  Code Status:  Full    Family Communication: Discussed in detail with the patient, all imaging results, lab results explained to the patient   Disposition Plan:  Anticipate dc tomorrow if pain is improved       Consultants:  None       Antibiotics: Anti-infectives (From admission, onward)   Start     Dose/Rate Route Frequency Ordered Stop   03/25/18 1530  cefTRIAXone (ROCEPHIN) 1 g in sodium chloride 0.9 % 100 mL IVPB     1 g 200 mL/hr over 30 Minutes Intravenous  Once 03/25/18 1521 03/25/18 1606         HPI/Subjective: Complaining of right  flank and pain under right breast  Objective: Vitals:   03/25/18 1630 03/25/18 1714 03/25/18 2017 03/26/18 0638  BP: (!) 154/78 (!) 152/73 (!) 167/89 (!) 152/70  Pulse: 71 73 75 (!) 59  Resp:  16 (!) 21 18  Temp:  98.2 F (36.8 C) 99.1 F (37.3 C) 98.1 F (36.7 C)  TempSrc:  Oral Oral Oral  SpO2: 95% 95% 94% 96%  Weight:  70.3 kg    Height:  4\' 11"  (1.499 m)      Intake/Output Summary (Last 24 hours) at 03/26/2018 1246 Last  data filed at 03/26/2018 0300 Gross per 24 hour  Intake 1108.68 ml  Output 950 ml  Net 158.68 ml    Exam:  Examination:  General exam: Appears calm and comfortable  Respiratory system: Clear to auscultation. Respiratory effort normal. Cardiovascular system: S1 & S2 heard, RRR. No JVD, murmurs, rubs, gallops or clicks. No pedal edema. Gastrointestinal system: Abdomen is nondistended, soft and nontender. No organomegaly or masses felt. Normal bowel sounds heard. Central nervous system: Alert and oriented. No focal neurological deficits. Extremities: Symmetric 5 x 5 power. Skin: No rashes, lesions or ulcers Psychiatry: Judgement and insight appear normal. Mood & affect appropriate.     Data Reviewed: I have personally reviewed following labs and imaging studies  Micro Results Recent Results (from the past 240 hour(s))  Urine culture     Status: None   Collection Time: 03/25/18 12:18 PM  Result Value Ref Range Status   Specimen Description   Final    URINE, CLEAN CATCH Performed at [facility] [address] [place], [place] [address]    Special Requests   Final    NONE Performed at [facility], [place] [address] [place], [place] [address]    Culture   Final    NO GROWTH Performed at [facility], [place] [address] [place], [place] [address]    Report Status 03/26/2018 FINAL  Final    Radiology Reports Ct Abdomen Pelvis Wo Contrast  Result Date: 03/25/2018 CLINICAL DATA:  Right flank pain the past month. Status post right nephrectomy on 03/20/2015 for renal cell carcinoma and partial left nephrectomy. EXAM: CT ABDOMEN AND PELVIS WITHOUT CONTRAST TECHNIQUE: Multidetector CT imaging of the abdomen and pelvis was performed following the standard protocol without IV contrast. COMPARISON:  Abdomen MR dated 01/08/2018 and 10/09/2017 and abdomen and pelvis CT dated 06/01/2017. FINDINGS: Lower chest: Clear lung bases. Hepatobiliary: Poorly  distended gallbladder. Normal appearing liver. Pancreas: Unremarkable. No pancreatic ductal dilatation or surrounding inflammatory changes. Spleen: Normal in size without focal abnormality. Adrenals/Urinary Tract: Normal appearing adrenal glands. Surgically absent right kidney and stable surgical absence of a portion of the left kidney. No left renal mass visualized. Unremarkable urinary bladder and left ureter. Stomach/Bowel: Small number of colonic diverticula. Surgically absent appendix. Unremarkable stomach and small bowel. Vascular/Lymphatic: Mild atheromatous arterial calcifications. No enlarged lymph nodes. Reproductive: Status post hysterectomy. No adnexal masses. Other: Midline surgical scar with a small supraumbilical incisional hernia containing fat. There is also a small umbilical hernia containing fat. Transversely oriented linear areas of increased density are again demonstrated just beneath the skin in the lower abdomen bilaterally. These an appearance suggesting scar formation. Musculoskeletal: Lumbar and lower thoracic spine degenerative changes. No evidence of bony metastatic disease. IMPRESSION: 1. Status post right nephrectomy and partial left nephrectomy with no evidence of tumor recurrence. 2. Mild colonic diverticulosis. 3. Small supraumbilical incisional hernia containing fat and small umbilical  hernia containing fat. Electronically Signed   By: Claudie Revering M.D.   On: 03/25/2018 13:32     CBC Recent Labs  Lab 03/25/18 1202 03/26/18 0340  WBC 4.5 5.3  HGB 12.4 11.4*  HCT 39.7 36.3  PLT 226 190  MCV 92.3 94.8  MCH 28.8 29.8  MCHC 31.2 31.4  RDW 14.0 14.0  LYMPHSABS 1.3  --   MONOABS 0.6  --   EOSABS 0.2  --   BASOSABS 0.0  --     Chemistries  Recent Labs  Lab 03/25/18 1202 03/26/18 0340  NA 140 142  K 5.8* 3.9  CL 108 115*  CO2 25 22  GLUCOSE 157* 83  BUN 28* 28*  CREATININE 2.06* 1.83*  CALCIUM 9.3 8.9  AST 41 14*  ALT 23 17  ALKPHOS 76 60  BILITOT 1.0  0.2*   ------------------------------------------------------------------------------------------------------------------ estimated creatinine clearance is 21.3 mL/min (A) (by C-G formula based on SCr of 1.83 mg/dL (H)). ------------------------------------------------------------------------------------------------------------------ No results for input(s): HGBA1C in the last 72 hours. ------------------------------------------------------------------------------------------------------------------ No results for input(s): CHOL, HDL, LDLCALC, TRIG, CHOLHDL, LDLDIRECT in the last 72 hours. ------------------------------------------------------------------------------------------------------------------ No results for input(s): TSH, T4TOTAL, T3FREE, THYROIDAB in the last 72 hours.  Invalid input(s): FREET3 ------------------------------------------------------------------------------------------------------------------ No results for input(s): VITAMINB12, FOLATE, FERRITIN, TIBC, IRON, RETICCTPCT in the last 72 hours.  Coagulation profile No results for input(s): INR, PROTIME in the last 168 hours.  No results for input(s): DDIMER in the last 72 hours.  Cardiac Enzymes No results for input(s): CKMB, TROPONINI, MYOGLOBIN in the last 168 hours.  Invalid input(s): CK ------------------------------------------------------------------------------------------------------------------ Invalid input(s): POCBNP   CBG: Recent Labs  Lab 03/25/18 1750 03/25/18 2148 03/26/18 0800 03/26/18 1155  GLUCAP 147* 198* 80 208*       Studies: Ct Abdomen Pelvis Wo Contrast  Result Date: 03/25/2018 CLINICAL DATA:  Right flank pain the past month. Status post right nephrectomy on 03/20/2015 for renal cell carcinoma and partial left nephrectomy. EXAM: CT ABDOMEN AND PELVIS WITHOUT CONTRAST TECHNIQUE: Multidetector CT imaging of the abdomen and pelvis was performed following the standard protocol  without IV contrast. COMPARISON:  Abdomen MR dated 01/08/2018 and 10/09/2017 and abdomen and pelvis CT dated 06/01/2017. FINDINGS: Lower chest: Clear lung bases. Hepatobiliary: Poorly distended gallbladder. Normal appearing liver. Pancreas: Unremarkable. No pancreatic ductal dilatation or surrounding inflammatory changes. Spleen: Normal in size without focal abnormality. Adrenals/Urinary Tract: Normal appearing adrenal glands. Surgically absent right kidney and stable surgical absence of a portion of the left kidney. No left renal mass visualized. Unremarkable urinary bladder and left ureter. Stomach/Bowel: Small number of colonic diverticula. Surgically absent appendix. Unremarkable stomach and small bowel. Vascular/Lymphatic: Mild atheromatous arterial calcifications. No enlarged lymph nodes. Reproductive: Status post hysterectomy. No adnexal masses. Other: Midline surgical scar with a small supraumbilical incisional hernia containing fat. There is also a small umbilical hernia containing fat. Transversely oriented linear areas of increased density are again demonstrated just beneath the skin in the lower abdomen bilaterally. These an appearance suggesting scar formation. Musculoskeletal: Lumbar and lower thoracic spine degenerative changes. No evidence of bony metastatic disease. IMPRESSION: 1. Status post right nephrectomy and partial left nephrectomy with no evidence of tumor recurrence. 2. Mild colonic diverticulosis. 3. Small supraumbilical incisional hernia containing fat and small umbilical hernia containing fat. Electronically Signed   By: Claudie Revering M.D.   On: 03/25/2018 13:32      Lab Results  Component Value Date   HGBA1C 10.2 (H) 02/06/2015   Lab Results  Component  Value Date   LDLCALC 75 12/28/2010   CREATININE 1.83 (H) 03/26/2018       Scheduled Meds: . amLODipine  5 mg Oral Daily  . aspirin EC  81 mg Oral QHS  . atorvastatin  10 mg Oral q morning - 10a  . cholecalciferol   1,000 Units Oral Daily  . dexamethasone  4 mg Intravenous Q12H  . donepezil  5 mg Oral Daily  . gabapentin  300 mg Oral QHS  . heparin  5,000 Units Subcutaneous Q8H  . insulin aspart  0-9 Units Subcutaneous TID WC  . insulin glargine  30 Units Subcutaneous Daily  . lidocaine  1 patch Transdermal Q24H  . metoprolol succinate  100 mg Oral Daily  . mirabegron ER  50 mg Oral Daily  . sodium polystyrene  15 g Oral Once   Continuous Infusions: . sodium chloride 75 mL/hr at 03/26/18 0300     LOS: 0 days    Time spent: >30 MINS    Reyne Dumas  [facility] Pager [phone]. If 7PM-7AM, please contact night-coverage at [email or web address], password [facility] 03/26/2018, 12:46 PM  LOS: 0 days

## 2018-03-27 LAB — GLUCOSE, CAPILLARY
Glucose-Capillary: 216 mg/dL — ABNORMAL HIGH (ref 70–99)
Glucose-Capillary: 359 mg/dL — ABNORMAL HIGH (ref 70–99)

## 2018-03-27 MED ORDER — LIDOCAINE 5 % EX PTCH: 1.0000 | MEDICATED_PATCH | CUTANEOUS | 0 refills | Status: DC

## 2018-03-27 MED ORDER — HYDROCODONE-ACETAMINOPHEN 5-325 MG PO TABS: 1.0000 | ORAL_TABLET | Freq: Four times a day (QID) | ORAL | 0 refills | Status: DC | PRN

## 2018-03-27 MED ORDER — DEXAMETHASONE 4 MG PO TABS: 4.0000 mg | ORAL_TABLET | Freq: Two times a day (BID) | ORAL | 0 refills | Status: AC

## 2018-03-27 NOTE — Progress Notes (Signed)
Reviewed d/c paperwork, medication regimen, & follow up appointments with patient. Patient d/c via NT by wheelchair.

## 2018-03-27 NOTE — Discharge Summary (Signed)
Physician Discharge Summary  [name] MRN: [id number] DOB/AGE: Feb 24, 1940 78 y.o.  PCP: [name], MD   Admit date: 03/25/2018 Discharge date: 03/27/2018  Discharge Diagnoses:    Active Problems:   AKI (acute kidney injury) ([place])    Follow-up recommendations Follow-up with PCP in 3-5 days , including all  additional recommended appointments as below Follow-up CBC, CMP in 3-5 days       Allergies as of 03/27/2018      Reactions   Sulfa Antibiotics Itching      Medication List    TAKE these medications   albuterol 90 MCG/ACT inhaler Commonly known as:  PROVENTIL,VENTOLIN Inhale 2 puffs into the lungs every 4 (four) hours as needed for wheezing or shortness of breath.   amLODipine 5 MG tablet Commonly known as:  NORVASC Take 5 mg by mouth daily.   aspirin EC 81 MG tablet Take 81 mg by mouth at bedtime.   atorvastatin 10 MG tablet Commonly known as:  LIPITOR Take 10 mg by mouth every morning.   CENTRUM SILVER ADULT 50+ PO Take 1 tablet by mouth daily.   cholecalciferol 1000 units tablet Commonly known as:  VITAMIN D Take 1,000 Units by mouth daily.   dexamethasone 4 MG tablet Commonly known as:  DECADRON Take 1 tablet (4 mg total) by mouth 2 (two) times daily for 2 days.   donepezil 5 MG tablet Commonly known as:  ARICEPT Take 5 mg by mouth daily.   gabapentin 300 MG capsule Commonly known as:  NEURONTIN Take 1 capsule (300 mg total) by mouth at bedtime.   HYDROcodone-acetaminophen 5-325 MG tablet Commonly known as:  NORCO/VICODIN Take 1 tablet by mouth every 6 (six) hours as needed for up to 15 doses for severe pain.   lidocaine 5 % Commonly known as:  LIDODERM Place 1 patch onto the skin daily. Remove & Discard patch within 12 hours or as directed by MD   metoprolol succinate 100 MG 24 hr tablet Commonly known as:  TOPROL-XL Take 100 mg by mouth daily. Take with or immediately following a meal.   mirabegron ER 50 MG Tb24 tablet Commonly  known as:  MYRBETRIQ Take 50 mg by mouth daily.   nitroGLYCERIN 0.4 MG SL tablet Commonly known as:  NITROSTAT Place 0.4 mg under the tongue every 5 (five) minutes as needed for chest pain.   TRADJENTA 5 MG Tabs tablet Generic drug:  linagliptin Take 5 mg by mouth daily.   TRESIBA FLEXTOUCH 200 UNIT/ML Sopn Generic drug:  Insulin Degludec Inject 74 Units into the skin daily before breakfast.        Discharge Condition:  stable  Discharge Instructions Get Medicines reviewed and adjusted: Please take all your medications with you for your next visit with your Primary MD  Please request your Primary MD to go over all hospital tests and procedure/radiological results at the follow up, please ask your Primary MD to get all Hospital records sent to his/her office.  If you experience worsening of your admission symptoms, develop shortness of breath, life threatening emergency, suicidal or homicidal thoughts you must seek medical attention immediately by calling 911 or calling your MD immediately  if symptoms less severe.  You must read complete instructions/literature along with all the possible adverse reactions/side effects for all the Medicines you take and that have been prescribed to you. Take any new Medicines after you have completely understood and accpet all the possible adverse reactions/side effects.   Do not drive  when taking Pain medications.   Do not take more than prescribed Pain, Sleep and Anxiety Medications  Special Instructions: If you have smoked or chewed Tobacco  in the last 2 yrs please stop smoking, stop any regular Alcohol  and or any Recreational drug use.  Wear Seat belts while driving.  Please note  You were cared for by a hospitalist during your hospital stay. Once you are discharged, your primary care physician will handle any further medical issues. Please note that NO REFILLS for any discharge medications will be authorized once you are discharged, as  it is imperative that you return to your primary care physician (or establish a relationship with a primary care physician if you do not have one) for your aftercare needs so that they can reassess your need for medications and monitor your lab values.     Allergies  Allergen Reactions  . Sulfa Antibiotics Itching      Disposition: Discharge disposition: 01-Home or Self Care        Consults none     Significant Diagnostic Studies:  Ct Abdomen Pelvis Wo Contrast  Result Date: 03/25/2018 CLINICAL DATA:  Right flank pain the past month. Status post right nephrectomy on 03/20/2015 for renal cell carcinoma and partial left nephrectomy. EXAM: CT ABDOMEN AND PELVIS WITHOUT CONTRAST TECHNIQUE: Multidetector CT imaging of the abdomen and pelvis was performed following the standard protocol without IV contrast. COMPARISON:  Abdomen MR dated 01/08/2018 and 10/09/2017 and abdomen and pelvis CT dated 06/01/2017. FINDINGS: Lower chest: Clear lung bases. Hepatobiliary: Poorly distended gallbladder. Normal appearing liver. Pancreas: Unremarkable. No pancreatic ductal dilatation or surrounding inflammatory changes. Spleen: Normal in size without focal abnormality. Adrenals/Urinary Tract: Normal appearing adrenal glands. Surgically absent right kidney and stable surgical absence of a portion of the left kidney. No left renal mass visualized. Unremarkable urinary bladder and left ureter. Stomach/Bowel: Small number of colonic diverticula. Surgically absent appendix. Unremarkable stomach and small bowel. Vascular/Lymphatic: Mild atheromatous arterial calcifications. No enlarged lymph nodes. Reproductive: Status post hysterectomy. No adnexal masses. Other: Midline surgical scar with a small supraumbilical incisional hernia containing fat. There is also a small umbilical hernia containing fat. Transversely oriented linear areas of increased density are again demonstrated just beneath the skin in the lower  abdomen bilaterally. These an appearance suggesting scar formation. Musculoskeletal: Lumbar and lower thoracic spine degenerative changes. No evidence of bony metastatic disease. IMPRESSION: 1. Status post right nephrectomy and partial left nephrectomy with no evidence of tumor recurrence. 2. Mild colonic diverticulosis. 3. Small supraumbilical incisional hernia containing fat and small umbilical hernia containing fat. Electronically Signed   By: Claudie Revering M.D.   On: 03/25/2018 13:32   Mr Thoracic Spine Wo Contrast  Result Date: 03/26/2018 CLINICAL DATA:  Back pain. Kidney cancer. RIGHT flank pain. Status post RIGHT nephrectomy. CT abdomen and pelvis shows no obvious abnormality. EXAM: MRI THORACIC AND LUMBAR SPINE WITHOUT CONTRAST TECHNIQUE: Multiplanar and multiecho pulse sequences of the thoracic and lumbar spine were obtained without intravenous contrast. COMPARISON:  CT abdomen and pelvis 03/25/2018. MRI cervical spine 10/24/2015. FINDINGS: MRI THORACIC SPINE FINDINGS Alignment:  Anatomic Vertebrae: Disc space narrowing with spontaneous apparent arthrodesis T4 through T9. T2 and STIR hyperintense endplate signal above and below T10-11, Modic type 1, without compression fracture, most consistent with acute degenerative disc disease at T10-11. No concern for osseous metastatic disease. Cord: Stenosis with mild cord flattening at multiple levels, described below. No abnormal cord signal. No intraspinal mass lesion. No concern for epidural  tumor. Paraspinal and other soft tissues: No visible thoracic pathology. Disc levels: The individual disc spaces are examined as follows: T1-2: Disc space narrowing. Central protrusion. BILATERAL T1 foraminal narrowing may be present. T2-3: Disc space narrowing. Shallow protrusion. No definite impingement. T3-4: Disc space narrowing. Central protrusion. Ligamentum flavum infolding. Mild stenosis. No definite foraminal narrowing. T4-5: Osseous spurring. Disc space narrowing.  Likely spontaneous arthrodesis. No impingement. T5-6: Osseous spurring. Disc space narrowing. Likely spontaneous arthrodesis. No impingement. T6-7: Osseous spurring. Disc space narrowing. Likely spontaneous arthrodesis. No impingement. T7-8: Osseous spurring. Disc space narrowing. Likely spontaneous arthrodesis. No impingement. T8-9: Disc space narrowing. Osseous spurring. Likely spontaneous arthrodesis. No impingement. T9-10: Osseous spurring. Disc space narrowing. Likely spontaneous arthrodesis. No impingement. T10-11: Disc space narrowing with vacuum phenomenon. Central protrusion. Posterior element hypertrophy with ligamentum flavum infolding. Mild stenosis. BILATERAL T10 foraminal narrowing. T11-12:  Unremarkable. T12-L1: Unremarkable. MRI LUMBAR SPINE FINDINGS Segmentation:  Standard. Alignment:  Anatomic. Vertebrae: No worrisome osseous lesion. Modic type 2 changes L5-S1, chronic disc space narrowing. No evidence of metastatic disease. Conus medullaris and cauda equina: Conus extends to the L1-L2. Level. Conus and cauda equina appear normal. Paraspinal and other soft tissues: Unremarkable. RIGHT nephrectomy. Partial LEFT nephrectomy. Disc levels: L1-L2: Normal disc space. Facet arthropathy and facet joint effusions, greater on the RIGHT. No impingement. L2-L3: Annular bulge. Posterior element hypertrophy. No impingement. L3-L4: Annular bulge. Posterior element hypertrophy. No impingement. L4-L5: Annular bulge. Posterior element hypertrophy. Mild stenosis. BILATERAL subarticular zone narrowing could affect either L5 nerve root. BILATERAL mild foraminal narrowing not clearly compressive. L5-S1: Disc space narrowing. Osseous spurring. Central and leftward protrusion. Posterior element hypertrophy. LEFT L5 and LEFT S1 nerve root impingement are likely. IMPRESSION: MR THORACIC SPINE IMPRESSION No metastatic disease or intraspinal mass lesion in the thoracic spine. Midthoracic spontaneous arthrodesis across  multiple disc spaces due to chronic degenerative change. Endplate reactive changes above and below the T10-11 disc space, where there is vacuum phenomenon, could contribute to mid back pain. MR LUMBAR SPINE IMPRESSION Mild multilevel spondylosis. No dominant RIGHT-sided compressive lesion. No evidence of osseous metastatic disease. Asymmetric facet arthropathy at L1-2 on the RIGHT, uncertain significance. Electronically Signed   By: Staci Righter M.D.   On: 03/26/2018 14:51   Mr Lumbar Spine Wo Contrast  Result Date: 03/26/2018 CLINICAL DATA:  Back pain. Kidney cancer. RIGHT flank pain. Status post RIGHT nephrectomy. CT abdomen and pelvis shows no obvious abnormality. EXAM: MRI THORACIC AND LUMBAR SPINE WITHOUT CONTRAST TECHNIQUE: Multiplanar and multiecho pulse sequences of the thoracic and lumbar spine were obtained without intravenous contrast. COMPARISON:  CT abdomen and pelvis 03/25/2018. MRI cervical spine 10/24/2015. FINDINGS: MRI THORACIC SPINE FINDINGS Alignment:  Anatomic Vertebrae: Disc space narrowing with spontaneous apparent arthrodesis T4 through T9. T2 and STIR hyperintense endplate signal above and below T10-11, Modic type 1, without compression fracture, most consistent with acute degenerative disc disease at T10-11. No concern for osseous metastatic disease. Cord: Stenosis with mild cord flattening at multiple levels, described below. No abnormal cord signal. No intraspinal mass lesion. No concern for epidural tumor. Paraspinal and other soft tissues: No visible thoracic pathology. Disc levels: The individual disc spaces are examined as follows: T1-2: Disc space narrowing. Central protrusion. BILATERAL T1 foraminal narrowing may be present. T2-3: Disc space narrowing. Shallow protrusion. No definite impingement. T3-4: Disc space narrowing. Central protrusion. Ligamentum flavum infolding. Mild stenosis. No definite foraminal narrowing. T4-5: Osseous spurring. Disc space narrowing. Likely  spontaneous arthrodesis. No impingement. T5-6: Osseous spurring. Disc space narrowing. Likely  spontaneous arthrodesis. No impingement. T6-7: Osseous spurring. Disc space narrowing. Likely spontaneous arthrodesis. No impingement. T7-8: Osseous spurring. Disc space narrowing. Likely spontaneous arthrodesis. No impingement. T8-9: Disc space narrowing. Osseous spurring. Likely spontaneous arthrodesis. No impingement. T9-10: Osseous spurring. Disc space narrowing. Likely spontaneous arthrodesis. No impingement. T10-11: Disc space narrowing with vacuum phenomenon. Central protrusion. Posterior element hypertrophy with ligamentum flavum infolding. Mild stenosis. BILATERAL T10 foraminal narrowing. T11-12:  Unremarkable. T12-L1: Unremarkable. MRI LUMBAR SPINE FINDINGS Segmentation:  Standard. Alignment:  Anatomic. Vertebrae: No worrisome osseous lesion. Modic type 2 changes L5-S1, chronic disc space narrowing. No evidence of metastatic disease. Conus medullaris and cauda equina: Conus extends to the L1-L2. Level. Conus and cauda equina appear normal. Paraspinal and other soft tissues: Unremarkable. RIGHT nephrectomy. Partial LEFT nephrectomy. Disc levels: L1-L2: Normal disc space. Facet arthropathy and facet joint effusions, greater on the RIGHT. No impingement. L2-L3: Annular bulge. Posterior element hypertrophy. No impingement. L3-L4: Annular bulge. Posterior element hypertrophy. No impingement. L4-L5: Annular bulge. Posterior element hypertrophy. Mild stenosis. BILATERAL subarticular zone narrowing could affect either L5 nerve root. BILATERAL mild foraminal narrowing not clearly compressive. L5-S1: Disc space narrowing. Osseous spurring. Central and leftward protrusion. Posterior element hypertrophy. LEFT L5 and LEFT S1 nerve root impingement are likely. IMPRESSION: MR THORACIC SPINE IMPRESSION No metastatic disease or intraspinal mass lesion in the thoracic spine. Midthoracic spontaneous arthrodesis across multiple disc  spaces due to chronic degenerative change. Endplate reactive changes above and below the T10-11 disc space, where there is vacuum phenomenon, could contribute to mid back pain. MR LUMBAR SPINE IMPRESSION Mild multilevel spondylosis. No dominant RIGHT-sided compressive lesion. No evidence of osseous metastatic disease. Asymmetric facet arthropathy at L1-2 on the RIGHT, uncertain significance. Electronically Signed   By: Staci Righter M.D.   On: 03/26/2018 14:51    echocardiogram        Filed Weights   03/25/18 1039 03/25/18 1714  Weight: 68.9 kg 70.3 kg     Microbiology: Recent Results (from the past 240 hour(s))  Urine culture     Status: None   Collection Time: 03/25/18 12:18 PM  Result Value Ref Range Status   Specimen Description   Final    URINE, CLEAN CATCH Performed at [facility] [address] [place], [place] [address]    Special Requests   Final    NONE Performed at [facility], [place] [address], [place], [place] [address]    Culture   Final    NO GROWTH Performed at [facility], [place] [address] [place], [place] [address]    Report Status 03/26/2018 FINAL  Final       Blood Culture    Component Value Date/Time   SDES  03/25/2018 1218    URINE, CLEAN CATCH Performed at [facility], [place] [address] [place], [place] [address]    SPECREQUEST  03/25/2018 1218    NONE Performed at [facility], [place] [address] [place], [place] [address]    CULT  03/25/2018 1218    NO GROWTH Performed at [facility], [place] [address], [place],  [address]    REPTSTATUS 03/26/2018 FINAL 03/25/2018 1218      Labs: Results for orders placed or performed during the hospital encounter of 03/25/18 (from the past 48 hour(s))  Glucose, capillary     Status: Abnormal   Collection Time: 03/25/18  5:50 PM  Result Value Ref Range   Glucose-Capillary 147 (H) 70 - 99 mg/dL  Comment 1  Notify RN    Comment 2 Document in Chart   Glucose, capillary     Status: Abnormal   Collection Time: 03/25/18  9:48 PM  Result Value Ref Range   Glucose-Capillary 198 (H) 70 - 99 mg/dL  Comprehensive metabolic panel     Status: Abnormal   Collection Time: 03/26/18  3:40 AM  Result Value Ref Range   Sodium 142 135 - 145 mmol/L   Potassium 3.9 3.5 - 5.1 mmol/L    Comment: DELTA CHECK NOTED   Chloride 115 (H) 98 - 111 mmol/L   CO2 22 22 - 32 mmol/L   Glucose, Bld 83 70 - 99 mg/dL   BUN 28 (H) 8 - 23 mg/dL   Creatinine, Ser 1.83 (H) 0.44 - 1.00 mg/dL   Calcium 8.9 8.9 - 10.3 mg/dL   Total Protein 6.6 6.5 - 8.1 g/dL   Albumin 3.3 (L) 3.5 - 5.0 g/dL   AST 14 (L) 15 - 41 U/L   ALT 17 0 - 44 U/L   Alkaline Phosphatase 60 38 - 126 U/L   Total Bilirubin 0.2 (L) 0.3 - 1.2 mg/dL   GFR calc non Af Amer 26 (L) >60 mL/min   GFR calc Af Amer 30 (L) >60 mL/min   Anion gap 5 5 - 15    Comment: Performed at [facility], [place] [address], [place], [place] [address]  CBC     Status: Abnormal   Collection Time: 03/26/18  3:40 AM  Result Value Ref Range   WBC 5.3 4.0 - 10.5 K/uL   RBC 3.83 (L) 3.87 - 5.11 MIL/uL   Hemoglobin 11.4 (L) 12.0 - 15.0 g/dL   HCT 36.3 36.0 - 46.0 %   MCV 94.8 80.0 - 100.0 fL   MCH 29.8 26.0 - 34.0 pg   MCHC 31.4 30.0 - 36.0 g/dL   RDW 14.0 11.5 - 15.5 %   Platelets 190 150 - 400 K/uL   nRBC 0.0 0.0 - 0.2 %    Comment: Performed at [facility], [place] [address], [place], [place] [address]  Glucose, capillary     Status: None   Collection Time: 03/26/18  8:00 AM  Result Value Ref Range   Glucose-Capillary 80 70 - 99 mg/dL   Comment 1 Notify RN    Comment 2 Document in Chart   Glucose, capillary     Status: Abnormal   Collection Time: 03/26/18 11:55 AM  Result Value Ref Range   Glucose-Capillary 208 (H) 70 - 99 mg/dL   Comment 1 Notify RN    Comment 2 Document in Chart   Glucose, capillary     Status: Abnormal   Collection  Time: 03/26/18  6:05 PM  Result Value Ref Range   Glucose-Capillary 323 (H) 70 - 99 mg/dL   Comment 1 Notify RN    Comment 2 Document in Chart   Glucose, capillary     Status: Abnormal   Collection Time: 03/26/18 10:18 PM  Result Value Ref Range   Glucose-Capillary 249 (H) 70 - 99 mg/dL  Glucose, capillary     Status: Abnormal   Collection Time: 03/27/18  7:40 AM  Result Value Ref Range   Glucose-Capillary 216 (H) 70 - 99 mg/dL   Comment 1 Notify RN    Comment 2 Document in Chart   Glucose, capillary     Status: Abnormal   Collection Time: 03/27/18 12:16 PM  Result Value Ref Range  Glucose-Capillary 359 (H) 70 - 99 mg/dL   Comment 1 Notify RN    Comment 2 Document in Chart      Lipid Panel     Component Value Date/Time   CHOL 137 12/28/2010 0010   TRIG 97 12/28/2010 0010   HDL 43 12/28/2010 0010   CHOLHDL 3.2 12/28/2010 0010   VLDL 19 12/28/2010 0010   LDLCALC 75 12/28/2010 0010     Lab Results  Component Value Date   HGBA1C 10.2 (H) 02/06/2015     Lab Results  Component Value Date   LDLCALC 75 12/28/2010   CREATININE 1.83 (H) 03/26/2018     HPI :  78 y.o.femalewith medical history significant ofkidney cancer status post right-sided nephrectomy as well as partial left nephrectomy, diabetes mellitus, hypertension, hyperlipidemia, presents to the hospital with chief complaint of right flank pain.   pain is worse when she is laying in bed and not moving around, especially when she wakes up in the morning, and as she starts getting up her pain slowly subsides with more activity during the day. She reports the pain as being sharp, left lateral flank/right lower groin. She denies any dysuria, denies any fever or chills, denies any increased frequency.    She underwent a CT scan of the abdomen and pelvis which showed no evidence of tumor recurrence, mild diverticulosis and a very small supraumbilical incisional hernia containing fat. She was given ceftriaxone for  presumed UTI and we are asked to admit  Assessment/Plan Mild elevation in creatinine in the setting of chronic kidney disease stageIV -Her most recent creatinine in 2019 was 1.7, currently at 2.0, slightly improved to 1.83.  -hyperkalemia  Has improved  With IVF . s/p Kayexalate and repeat potassium   , k 3.9    Right-sided flank pain/history of right-sided nephrectomy and partial left nephrectomy This is been going on for a month, this is very unlikely that is pyelonephritis as she had a total nephrectomy on the right side. She has bacteriuria however denies any UTI type symptoms for me, no dysuria, no lower abdominal pain, no fevers, she has no leukocytosis. She was given ceftriaxone in the ED and urine cultures were sent which has no growth so far  , -She normally follows with Dr. Alinda Money as an outpatient,she tells me that he saw her about a month ago and she was having the flank pain at that time.She had an abdominal MRI in 11/19 which was within normal limits . No findings to suggest residual or recurrence of tumor status post right nephrectomy and left partial nephrectomy.admission CT abdomen pelvis did show Lumbar and lower thoracic spine degenerative changes. No evidence of bony metastatic disease MRI thoracic /lumabr spine  Showed multiple disc spaces with chronic degenerative changes, multilevel spondylosis of the lumbar spine.  Patient also started on Vicodin and Decadron for pain control. Suspect her pain is mostly musculoskeletal and she would need steroids for treating acute flare 2 more days     Type2 diabetes mellitus -Insulin-dependent, resume home  Regimen of tresiba   Hypertension/hyperlipidemia -Continue home medications   Discharge Exam:   Blood pressure (!) 172/89, pulse 75, temperature 98.1 F (36.7 C), temperature source Oral, resp. rate 18, height 4\' 11"  (1.499 m), weight 70.3 kg, SpO2 97 %.  Cardiovascular system: S1 & S2 heard, RRR. No JVD, murmurs,  rubs, gallops or clicks. No pedal edema. Gastrointestinal system: Abdomen is nondistended, soft and nontender. No organomegaly or masses felt. Normal bowel sounds heard. Central  nervous system: Alert and oriented. No focal neurological deficits. Extremities: Symmetric 5 x 5 power. Skin: No rashes, lesions or ulcers    Follow-up Information    [name], MD. Call.   Specialty:  Family Medicine Why:  Hospital follow-up in 3-5 days Contact information: [address] [address] [place] [address] [phone]           Signed: Reyne Dumas 03/27/2018, 1:37 PM      Time needed to  prepare  discharge, discussed with the patient and family 63 minutes

## 2018-03-27 NOTE — Progress Notes (Signed)
Dr. Allyson Sabal requested PT evaluation for discharge. No one from PT was assigned to the patient's care team. Paged PT, awaiting response. Patient adamant about leaving. Walked patient full length of hallway. Patient is steady and stable on her feet.

## 2018-03-27 NOTE — Plan of Care (Signed)
Plan of care reviewed and discussed with the patient. 

## 2018-04-04 ENCOUNTER — Other Ambulatory Visit: Payer: Self-pay | Admitting: Cardiology

## 2018-04-04 DIAGNOSIS — I10 Essential (primary) hypertension: Secondary | ICD-10-CM

## 2018-04-09 ENCOUNTER — Ambulatory Visit: Payer: Self-pay

## 2018-04-13 ENCOUNTER — Ambulatory Visit (INDEPENDENT_AMBULATORY_CARE_PROVIDER_SITE_OTHER): Payer: Medicare Other | Admitting: Cardiology

## 2018-04-13 VITALS — BP 148/70 | HR 68 | Ht 59.0 in | Wt 154.0 lb

## 2018-04-13 DIAGNOSIS — I10 Essential (primary) hypertension: Secondary | ICD-10-CM

## 2018-04-14 NOTE — Progress Notes (Signed)
BP is well controlled and she will keep her appointments with [place].

## 2018-04-22 ENCOUNTER — Other Ambulatory Visit: Payer: Self-pay | Admitting: Family Medicine

## 2018-04-22 DIAGNOSIS — Z1231 Encounter for screening mammogram for malignant neoplasm of breast: Secondary | ICD-10-CM

## 2018-05-04 ENCOUNTER — Other Ambulatory Visit: Payer: Self-pay | Admitting: Cardiology

## 2018-05-04 DIAGNOSIS — I10 Essential (primary) hypertension: Secondary | ICD-10-CM

## 2018-05-20 ENCOUNTER — Ambulatory Visit: Payer: Medicare Other

## 2018-06-05 ENCOUNTER — Encounter: Payer: Self-pay | Admitting: Cardiology

## 2018-06-08 ENCOUNTER — Encounter: Payer: Self-pay | Admitting: Cardiology

## 2018-06-08 ENCOUNTER — Ambulatory Visit (INDEPENDENT_AMBULATORY_CARE_PROVIDER_SITE_OTHER): Payer: Medicare Other | Admitting: Cardiology

## 2018-06-08 ENCOUNTER — Other Ambulatory Visit: Payer: Self-pay

## 2018-06-08 VITALS — BP 130/70 | HR 62

## 2018-06-08 DIAGNOSIS — I10 Essential (primary) hypertension: Secondary | ICD-10-CM

## 2018-06-08 NOTE — Progress Notes (Signed)
Telephone visit note  Subjective:   [name], female    DOB: 1940-09-24, 78 y.o.   MRN: [id number]   I connected with the patient on 06/08/18 by a telephone call and verified that I am speaking with the correct person using two identifiers.     I offered the patient a video enabled application for a virtual visit. Unfortunately, this could not be accomplished due to technical difficulties/lack of video enabled phone/computer. I discussed the limitations of evaluation and management by telemedicine and the availability of in person appointments. The patient expressed understanding and agreed to proceed.   This visit type was conducted due to national recommendations for restrictions regarding the COVID-19 Pandemic (e.g. social distancing).  This format is felt to be most appropriate for this patient at this time.  All issues noted in this document were discussed and addressed.  No physical exam was performed (except for noted visual exam findings with Tele health visits).  The patient has consented to conduct a Tele health visit and understands insurance will be billed.      Chief complaint:  Hypertension   HPI  77 y.o. African American female with hypertension, type 2 DM, hyperlipidemia, prior normal coronary angiograms 2012, 2016, h/o nephrectomy, CKD III-IV, GERD, mild dementia.  I saw the patient last in 02/2018. Subsequently, she had a BP check visit in 03/2018 showing controlled blood pressure. She was hospitalized in 03/2018 with acute kidney injury.   She has recently had problems controlling her blood sugar, but otherwise denies any complaints. She denies chest pain, shortness of breath, palpitations, leg edema, orthopnea, PND, TIA/syncope. Blood pressure is well controlled. She has not had any recent follow up with nephrology. She had recent labs performed by PCP.   She asked me if she could sit on her deck without a mask. There is no other individual in 6-10 ft distance  when she does so.    Past Medical History:  Diagnosis Date  . ACS (acute coronary syndrome) ([place]) 12/27/2010  . AKI (acute kidney injury) ([place]) 03/25/2018  . Anginal pain ([place])    just occurs without any specific trigger; pt states has never had to use nitroglycerin tabs; uses rest to relieve  . Arthritis   . Asthma   . Diabetes mellitus    Type 1  . Dysrhythmia    "irregular heart beat"  . GERD (gastroesophageal reflux disease)   . History of urinary tract infection   . Hypercholesteremia   . Hypertension   . left renal ca dx'd 2012 (?)   surg only, left kidney  . Nocturia   . Reflux   . Renal failure (ARF), acute on chronic (HCC)   . Shortness of breath dyspnea    pt denies; states can climb stairs w/o difficulty   . Sleep apnea    does not use c-pap machine  . Tingling in extremities    legs bilat  . Tinnitus   . Urinary frequency   . Urinary incontinence   . Vertigo    occurs when lying flat      Past Surgical History:  Procedure Laterality Date  . ABDOMINAL HYSTERECTOMY    . APPENDECTOMY    . CARDIAC CATHETERIZATION N/A 07/28/2014   Procedure: Left Heart Cath and Coronary Angiography;  Surgeon: [name], MD;  Location: [facility];  Service: Cardiovascular;  Laterality: N/A;  . CYSTOSCOPY WITH RETROGRADE PYELOGRAM, URETEROSCOPY AND STENT PLACEMENT Right 02/09/2015   Procedure: CYSTOSCOPY WITH RETROGRADE  PYELOGRAM AND URETEROSCOPY ;  Surgeon: Raynelle Bring, MD;  Location: [facility];  Service: Urology;  Laterality: Right;  . LAPAROSCOPIC NEPHRECTOMY Right 03/20/2015   Procedure: LAPAROSCOPIC NEPHRECTOMY;  Surgeon: Raynelle Bring, MD;  Location: [facility];  Service: Urology;  Laterality: Right;  . PERIPHERAL VASCULAR CATHETERIZATION N/A 07/28/2014   Procedure: Aortic Arch Angiography;  Surgeon: [name], MD;  Location: [facility];  Service: Cardiovascular;  Laterality: N/A;  . RENAL MASS EXCISION    . SPINE SURGERY       Social History   Socioeconomic  History  . Marital status: Widowed    Spouse name: Not on file  . Number of children: 2  . Years of education: Not on file  . Highest education level: Not on file  Occupational History  . Not on file  Social Needs  . Financial resource strain: Not on file  . Food insecurity:    Worry: Not on file    Inability: Not on file  . Transportation needs:    Medical: Not on file    Non-medical: Not on file  Tobacco Use  . Smoking status: Never Smoker  . Smokeless tobacco: Never Used  Substance and Sexual Activity  . Alcohol use: No  . Drug use: No    Frequency: 1.0 times per week  . Sexual activity: Never  Lifestyle  . Physical activity:    Days per week: Not on file    Minutes per session: Not on file  . Stress: Not on file  Relationships  . Social connections:    Talks on phone: Not on file    Gets together: Not on file    Attends religious service: Not on file    Active member of club or organization: Not on file    Attends meetings of clubs or organizations: Not on file    Relationship status: Not on file  . Intimate partner violence:    Fear of current or ex partner: Not on file    Emotionally abused: Not on file    Physically abused: Not on file    Forced sexual activity: Not on file  Other Topics Concern  . Not on file  Social History Narrative  . Not on file     Family History  Problem Relation Age of Onset  . Hypertension Mother   . Coronary artery disease Other      Current Outpatient Medications on File Prior to Visit  Medication Sig Dispense Refill  . albuterol (PROVENTIL,VENTOLIN) 90 MCG/ACT inhaler Inhale 2 puffs into the lungs every 4 (four) hours as needed for wheezing or shortness of breath.     Marland Kitchen amLODipine (NORVASC) 5 MG tablet TAKE 1 TABLET BY MOUTH EVERY DAY 30 tablet 1  . aspirin EC 81 MG tablet Take 81 mg by mouth at bedtime.    Marland Kitchen atorvastatin (LIPITOR) 10 MG tablet Take 10 mg by mouth every morning.     . cholecalciferol (VITAMIN D) 1000  units tablet Take 1,000 Units by mouth daily.    Marland Kitchen donepezil (ARICEPT) 5 MG tablet Take 5 mg by mouth daily.  1  . gabapentin (NEURONTIN) 300 MG capsule Take 1 capsule (300 mg total) by mouth at bedtime. 90 capsule 0  . HYDROcodone-acetaminophen (NORCO/VICODIN) 5-325 MG tablet Take 1 tablet by mouth every 6 (six) hours as needed for up to 15 doses for severe pain. 15 tablet 0  . Insulin Degludec (TRESIBA FLEXTOUCH) 200 UNIT/ML SOPN Inject 74 Units into the  skin daily before breakfast.     . lidocaine (LIDODERM) 5 % Place 1 patch onto the skin daily. Remove & Discard patch within 12 hours or as directed by MD 30 patch 0  . metoprolol succinate (TOPROL-XL) 100 MG 24 hr tablet TAKE 1 TABLET BY MOUTH EVERY DAY 30 tablet 1  . mirabegron ER (MYRBETRIQ) 50 MG TB24 tablet Take 50 mg by mouth daily.    . Multiple Vitamins-Minerals (CENTRUM SILVER ADULT 50+ PO) Take 1 tablet by mouth daily.    . nitroGLYCERIN (NITROSTAT) 0.4 MG SL tablet Place 0.4 mg under the tongue every 5 (five) minutes as needed for chest pain.    . TRADJENTA 5 MG TABS tablet Take 5 mg by mouth daily.  3   No current facility-administered medications on file prior to visit.     Cardiovascular studies:  EKG 02/12/2017: Sinus rhythm 78 bpm. Nprmal EKG  Echocardiogram 03/02/2018: Mod LVH. EF 58%. No significant valvular abnormalities.  Normal right atrial pressure. No significant change compared to previous study in 2016.    Recent labs: Results for [name], [name] (MRN [id number]) as of 06/08/2018 09:36  Ref. Range 06/01/2017 13:39 06/01/2017 16:41 03/25/2018 12:02 03/26/2018 03:40  COMPREHENSIVE METABOLIC PANEL Unknown Rpt (A)  Rpt (A) Rpt (A)  Sodium Latest Ref Range: 135 - 145 mmol/L 142  140 142  Potassium Latest Ref Range: 3.5 - 5.1 mmol/L 4.1  5.8 (H) 3.9  Chloride Latest Ref Range: 98 - 111 mmol/L 106  108 115 (H)  CO2 Latest Ref Range: 22 - 32 mmol/L 25  25 22   Glucose Latest Ref Range: 70 - 99 mg/dL 175 (H)  157 (H) 83   BUN Latest Ref Range: 8 - 23 mg/dL 23 (H)  28 (H) 28 (H)  Creatinine Latest Ref Range: 0.44 - 1.00 mg/dL 1.71 (H)  2.06 (H) 1.83 (H)  Calcium Latest Ref Range: 8.9 - 10.3 mg/dL 9.8  9.3 8.9  Anion gap Latest Ref Range: 5 - 15  11  7 5   Alkaline Phosphatase Latest Ref Range: 38 - 126 U/L 78  76 60  Albumin Latest Ref Range: 3.5 - 5.0 g/dL 4.2  4.2 3.3 (L)  Lipase Latest Ref Range: 11 - 51 U/L 54 (H)  55 (H)   AST Latest Ref Range: 15 - 41 U/L 18  41 14 (L)  ALT Latest Ref Range: 0 - 44 U/L 23  23 17   Total Protein Latest Ref Range: 6.5 - 8.1 g/dL 8.7 (H)  8.0 6.6  Total Bilirubin Latest Ref Range: 0.3 - 1.2 mg/dL 0.5  1.0 0.2 (L)  GFR, Est Non African American Latest Ref Range: >60 mL/min 27 (L)  22 (L) 26 (L)  GFR, Est African American Latest Ref Range: >60 mL/min 32 (L)  26 (L) 30 (L)   Results for [name], [name] (MRN [id number]) as of 06/08/2018 09:36  Ref. Range 03/25/2018 12:02 03/26/2018 03:40  WBC Latest Ref Range: 4.0 - 10.5 K/uL 4.5 5.3  RBC Latest Ref Range: 3.87 - 5.11 MIL/uL 4.30 3.83 (L)  Hemoglobin Latest Ref Range: 12.0 - 15.0 g/dL 12.4 11.4 (L)  HCT Latest Ref Range: 36.0 - 46.0 % 39.7 36.3  MCV Latest Ref Range: 80.0 - 100.0 fL 92.3 94.8  MCH Latest Ref Range: 26.0 - 34.0 pg 28.8 29.8  MCHC Latest Ref Range: 30.0 - 36.0 g/dL 31.2 31.4  RDW Latest Ref Range: 11.5 - 15.5 % 14.0 14.0  Platelets Latest Ref Range: 150 -  400 K/uL 226 190  nRBC Latest Ref Range: 0.0 - 0.2 % 0.0 0.0   ROS       Vitals:   06/08/18 0945  BP: 130/70  Pulse: 62   (Measured by the patient using a home BP monitor)   Objective:     Physical exam: Not performed, as this is a telephone visit       Assessment & Recommendations:   78 y.o. African American female with hypertension, type 2 DM, hyperlipidemia, prior normal coronary angiograms 2012, 2016, h/o nephrectomy, CKD III-IV, GERD, mild dementia.  Chest pain: No recent complaints  Leg edema: Resolved after reducing the dose of  amlodipine.  Hypertension: Controlled. Continue current antihypertensive therapy.  Primary prevention: In absence of bleeding and presence of DM, okay to continue Asa 81 mg daily. Continue lipitor 10 mg daily. Lipid panel and follow up in 6 months.   Nigel Mormon, MD [facility] Pager: [phone] Office: [phone] If no answer Cell [phone]

## 2018-07-03 ENCOUNTER — Other Ambulatory Visit: Payer: Self-pay

## 2018-07-03 ENCOUNTER — Ambulatory Visit
Admission: RE | Admit: 2018-07-03 | Discharge: 2018-07-03 | Disposition: A | Discharge status: Home / Self Care | Payer: Medicare Other | Source: Ambulatory Visit | Attending: Family Medicine | Admitting: Family Medicine

## 2018-07-03 DIAGNOSIS — Z1231 Encounter for screening mammogram for malignant neoplasm of breast: Secondary | ICD-10-CM

## 2018-07-09 ENCOUNTER — Other Ambulatory Visit: Payer: Self-pay | Admitting: Cardiology

## 2018-07-09 ENCOUNTER — Ambulatory Visit: Payer: Medicare Other

## 2018-07-09 DIAGNOSIS — I10 Essential (primary) hypertension: Secondary | ICD-10-CM

## 2018-07-09 NOTE — Telephone Encounter (Signed)
Please fill

## 2018-07-18 ENCOUNTER — Other Ambulatory Visit: Payer: Self-pay | Admitting: Cardiology

## 2018-07-18 DIAGNOSIS — I10 Essential (primary) hypertension: Secondary | ICD-10-CM

## 2018-07-20 ENCOUNTER — Other Ambulatory Visit: Payer: Self-pay

## 2018-07-20 NOTE — Telephone Encounter (Signed)
Please fill

## 2018-08-29 IMAGING — CR DG CHEST 2V
2 series · 2 of 2 positions shown · non-contrast
Comparison: 03/25/2015.

CLINICAL DATA: Bilateral renal cell carcinoma.

EXAM:
CHEST  2 VIEW

[w chest pa]
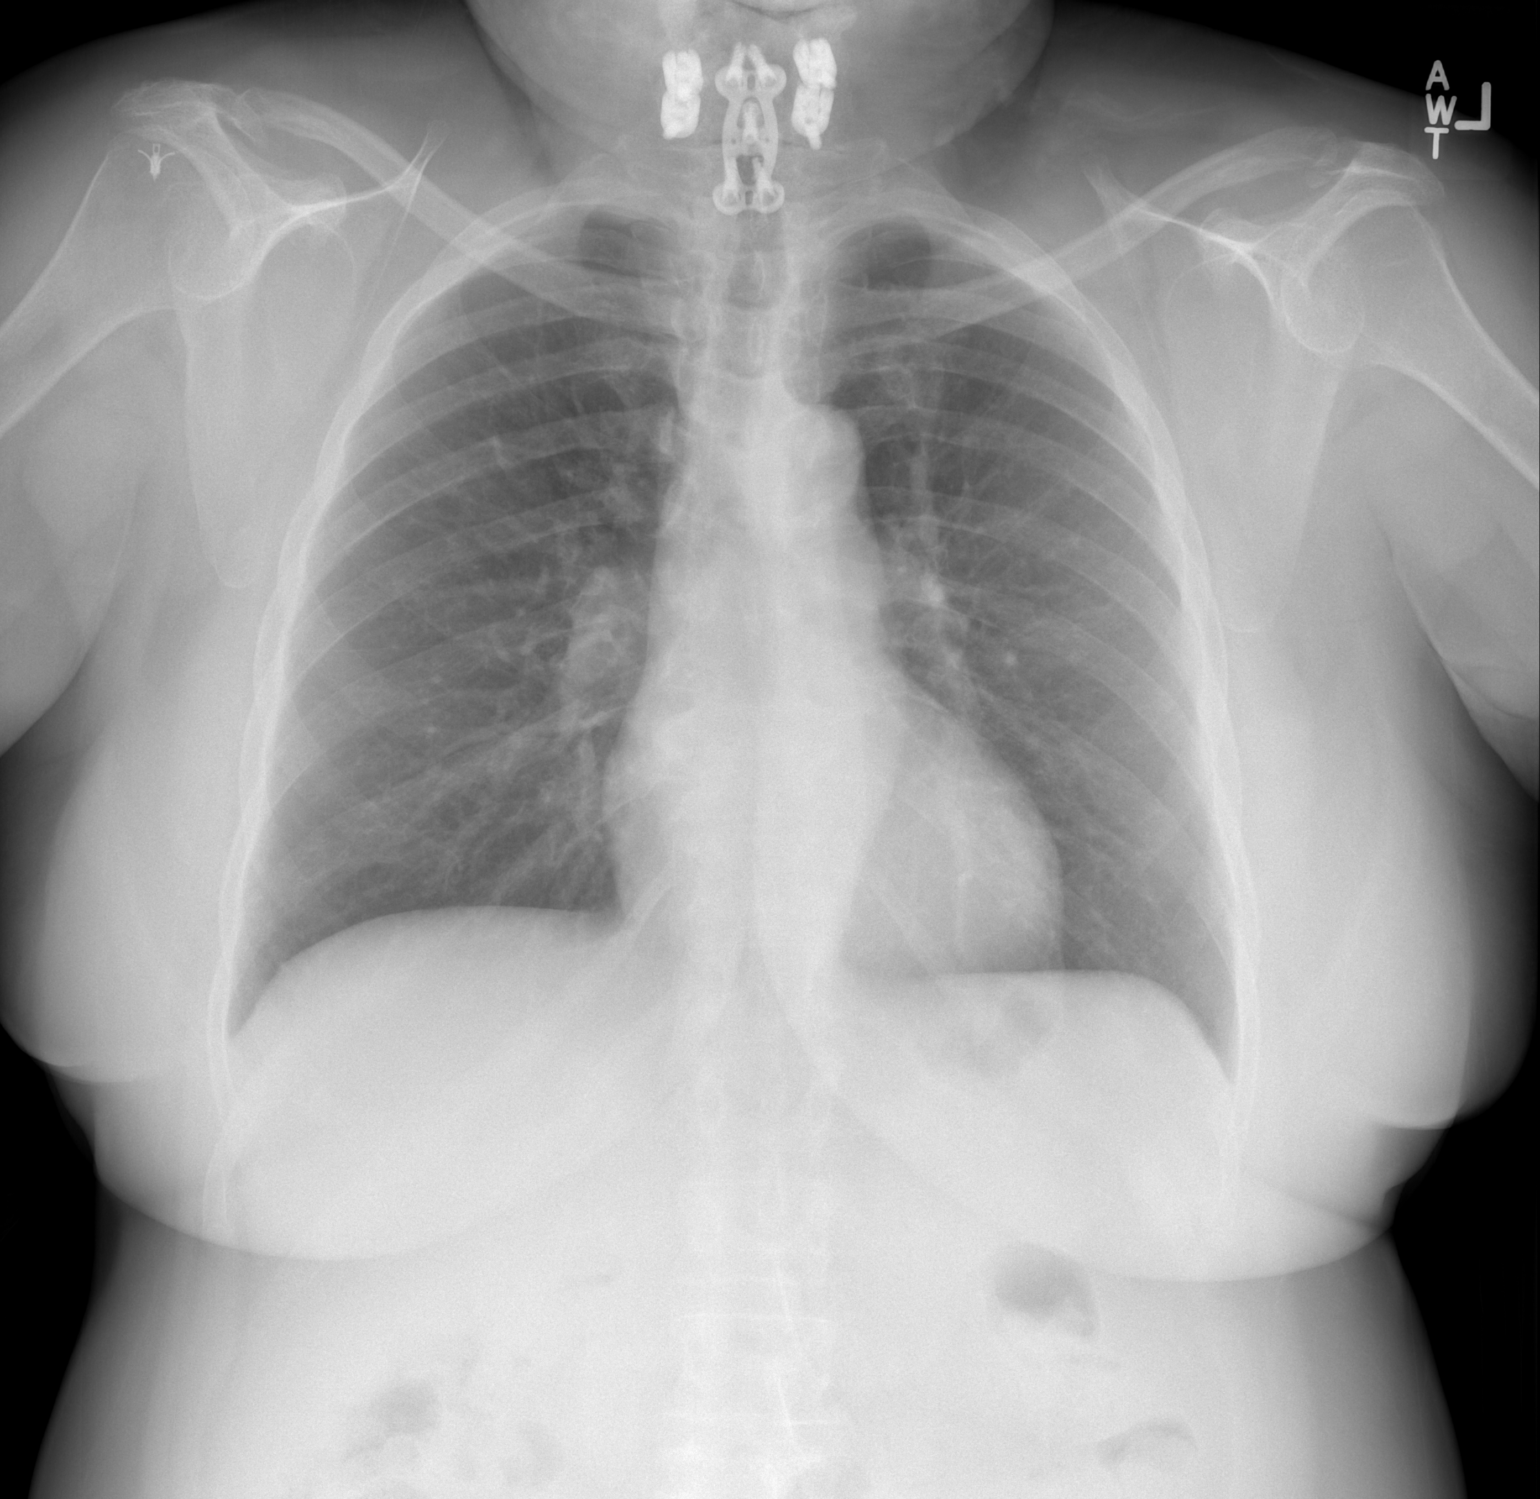

[w chest lat]
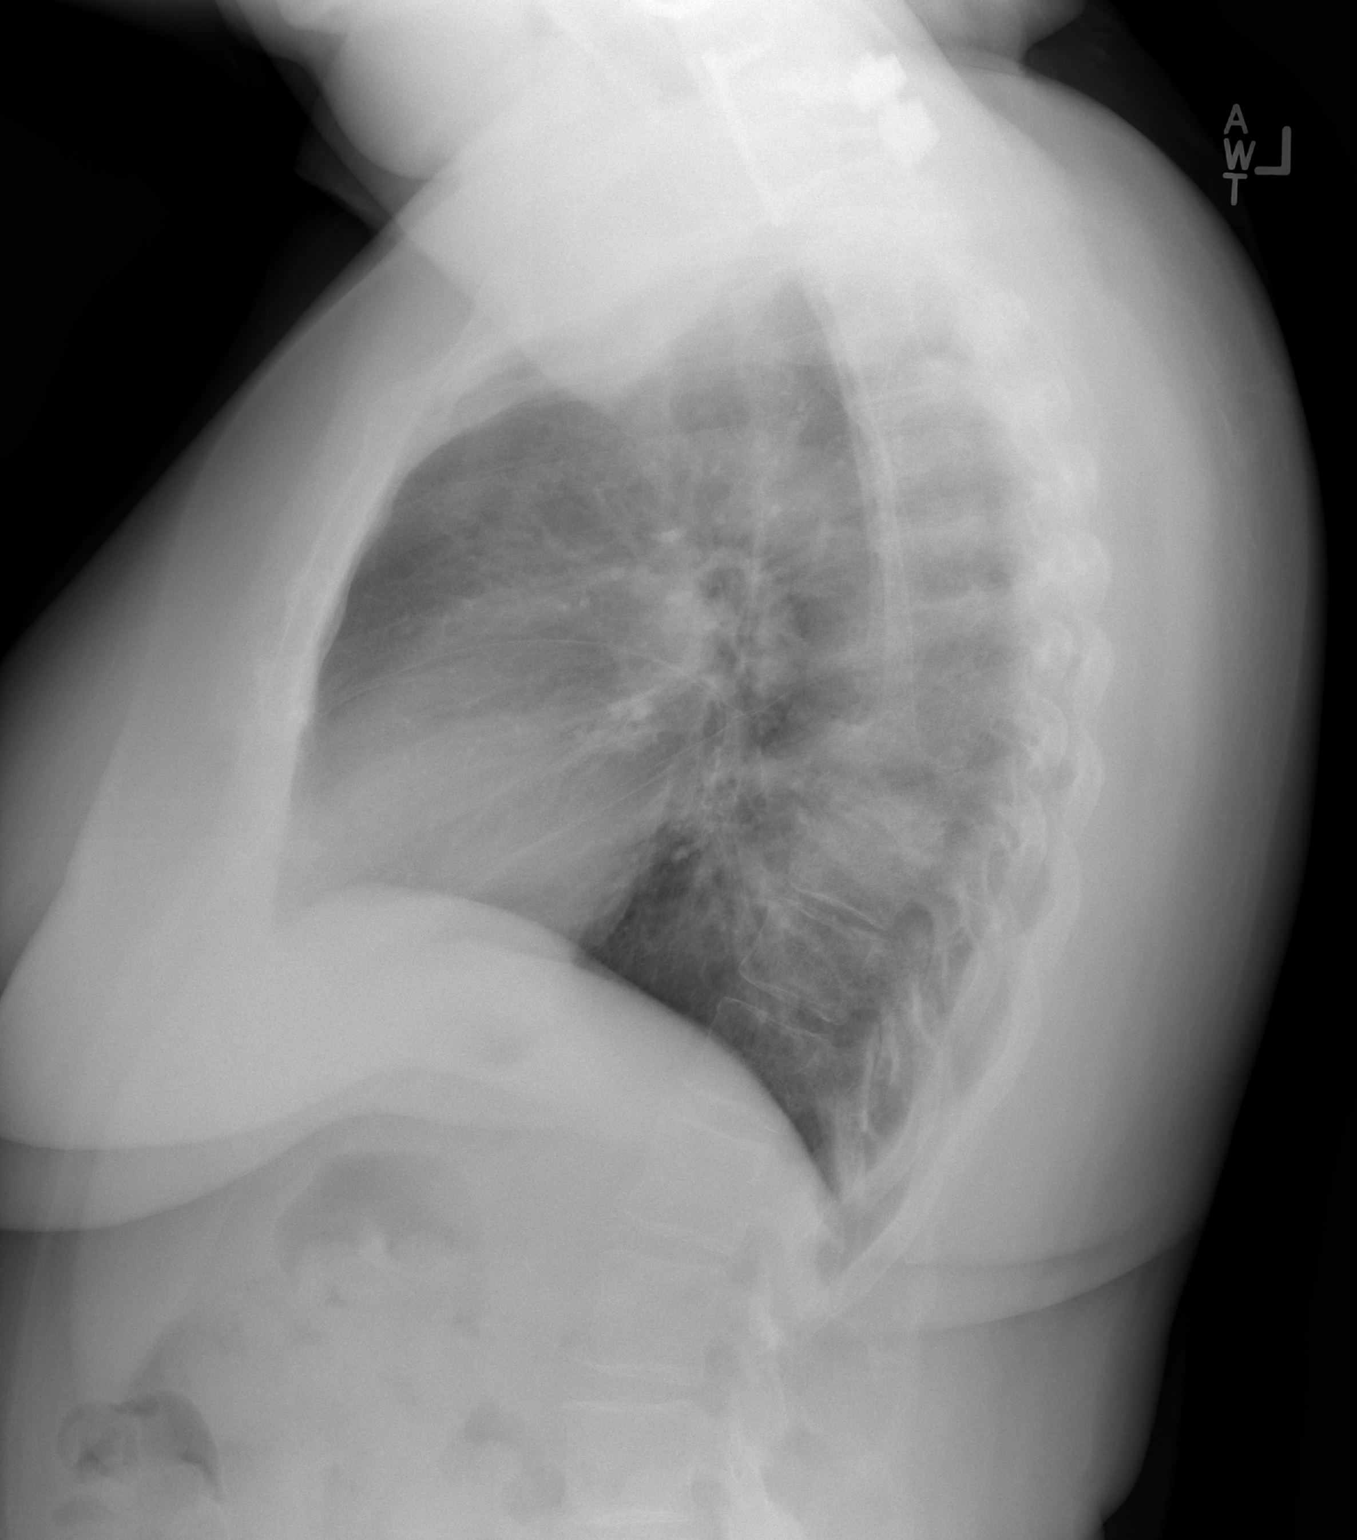

[2 of 2 positions shown; findings below may reference images not displayed]

FINDINGS: Trachea is midline. Heart size normal. Lungs are clear. No pleural
fluid. Postoperative changes are seen in the neck. Mitek anchor in
the right humeral head. Degenerative changes are seen in the spine
and acromioclavicular joints.
IMPRESSION: No acute findings.  No evidence of metastatic disease.

## 2018-09-01 ENCOUNTER — Ambulatory Visit: Payer: Medicare Other | Admitting: Podiatry

## 2018-09-01 ENCOUNTER — Other Ambulatory Visit: Payer: Self-pay

## 2018-09-01 ENCOUNTER — Encounter: Payer: Self-pay | Admitting: Podiatry

## 2018-09-01 DIAGNOSIS — B351 Tinea unguium: Secondary | ICD-10-CM | POA: Diagnosis not present

## 2018-09-01 DIAGNOSIS — E669 Obesity, unspecified: Secondary | ICD-10-CM

## 2018-09-01 DIAGNOSIS — M79675 Pain in left toe(s): Secondary | ICD-10-CM | POA: Diagnosis not present

## 2018-09-01 DIAGNOSIS — M79674 Pain in right toe(s): Secondary | ICD-10-CM

## 2018-09-01 DIAGNOSIS — E1169 Type 2 diabetes mellitus with other specified complication: Secondary | ICD-10-CM | POA: Diagnosis not present

## 2018-09-01 NOTE — Progress Notes (Signed)
Complaint:  Visit Type: Patient returns to my office for continued preventative foot care services. Complaint: Patient states" my nails have grown long and thick and become painful to walk and wear shoes" Patient has been diagnosed with DM with neuropathy.. The patient presents for preventative foot care services. No changes to ROS  Podiatric Exam: Vascular: dorsalis pedis and posterior tibial pulses are palpable bilateral. Capillary return is immediate. Temperature gradient is WNL. Skin turgor WNL  Sensorium: Normal Semmes Weinstein monofilament test. Normal tactile sensation bilaterally. Nail Exam: Pt has thick disfigured discolored nails with subungual debris noted bilateral entire nail hallux through fifth toenails.  Black line extending under the nail plate left hallux.  Patient says this has been present since childhood. Ulcer Exam: There is no evidence of ulcer or pre-ulcerative changes or infection. Orthopedic Exam: Muscle tone and strength are WNL. No limitations in general ROM. No crepitus or effusions noted. Foot type and digits show no abnormalities.  DJD 1st MPJ  B/L. Skin: No Porokeratosis. No infection or ulcers  Diagnosis:  Onychomycosis, , Pain in right toe, pain in left toes  Treatment & Plan Procedures and Treatment: Consent by patient was obtained for treatment procedures.   Debridement of mycotic and hypertrophic toenails, 1 through 5 bilateral and clearing of subungual debris. No ulceration, no infection noted.  Return Visit-Office Procedure: Patient instructed to return to the office for a follow up visit 4 months for continued evaluation and treatment.    Gardiner Barefoot DPM

## 2018-09-02 ENCOUNTER — Encounter: Payer: Self-pay | Admitting: Adult Health

## 2018-09-03 IMAGING — CR DG CHEST 2V
2 series · 2 of 2 positions shown · non-contrast
Comparison: October 18, 2015

CLINICAL DATA: Chest pain for 1 week.  Cough.

EXAM:
CHEST  2 VIEW

[w chest pa]
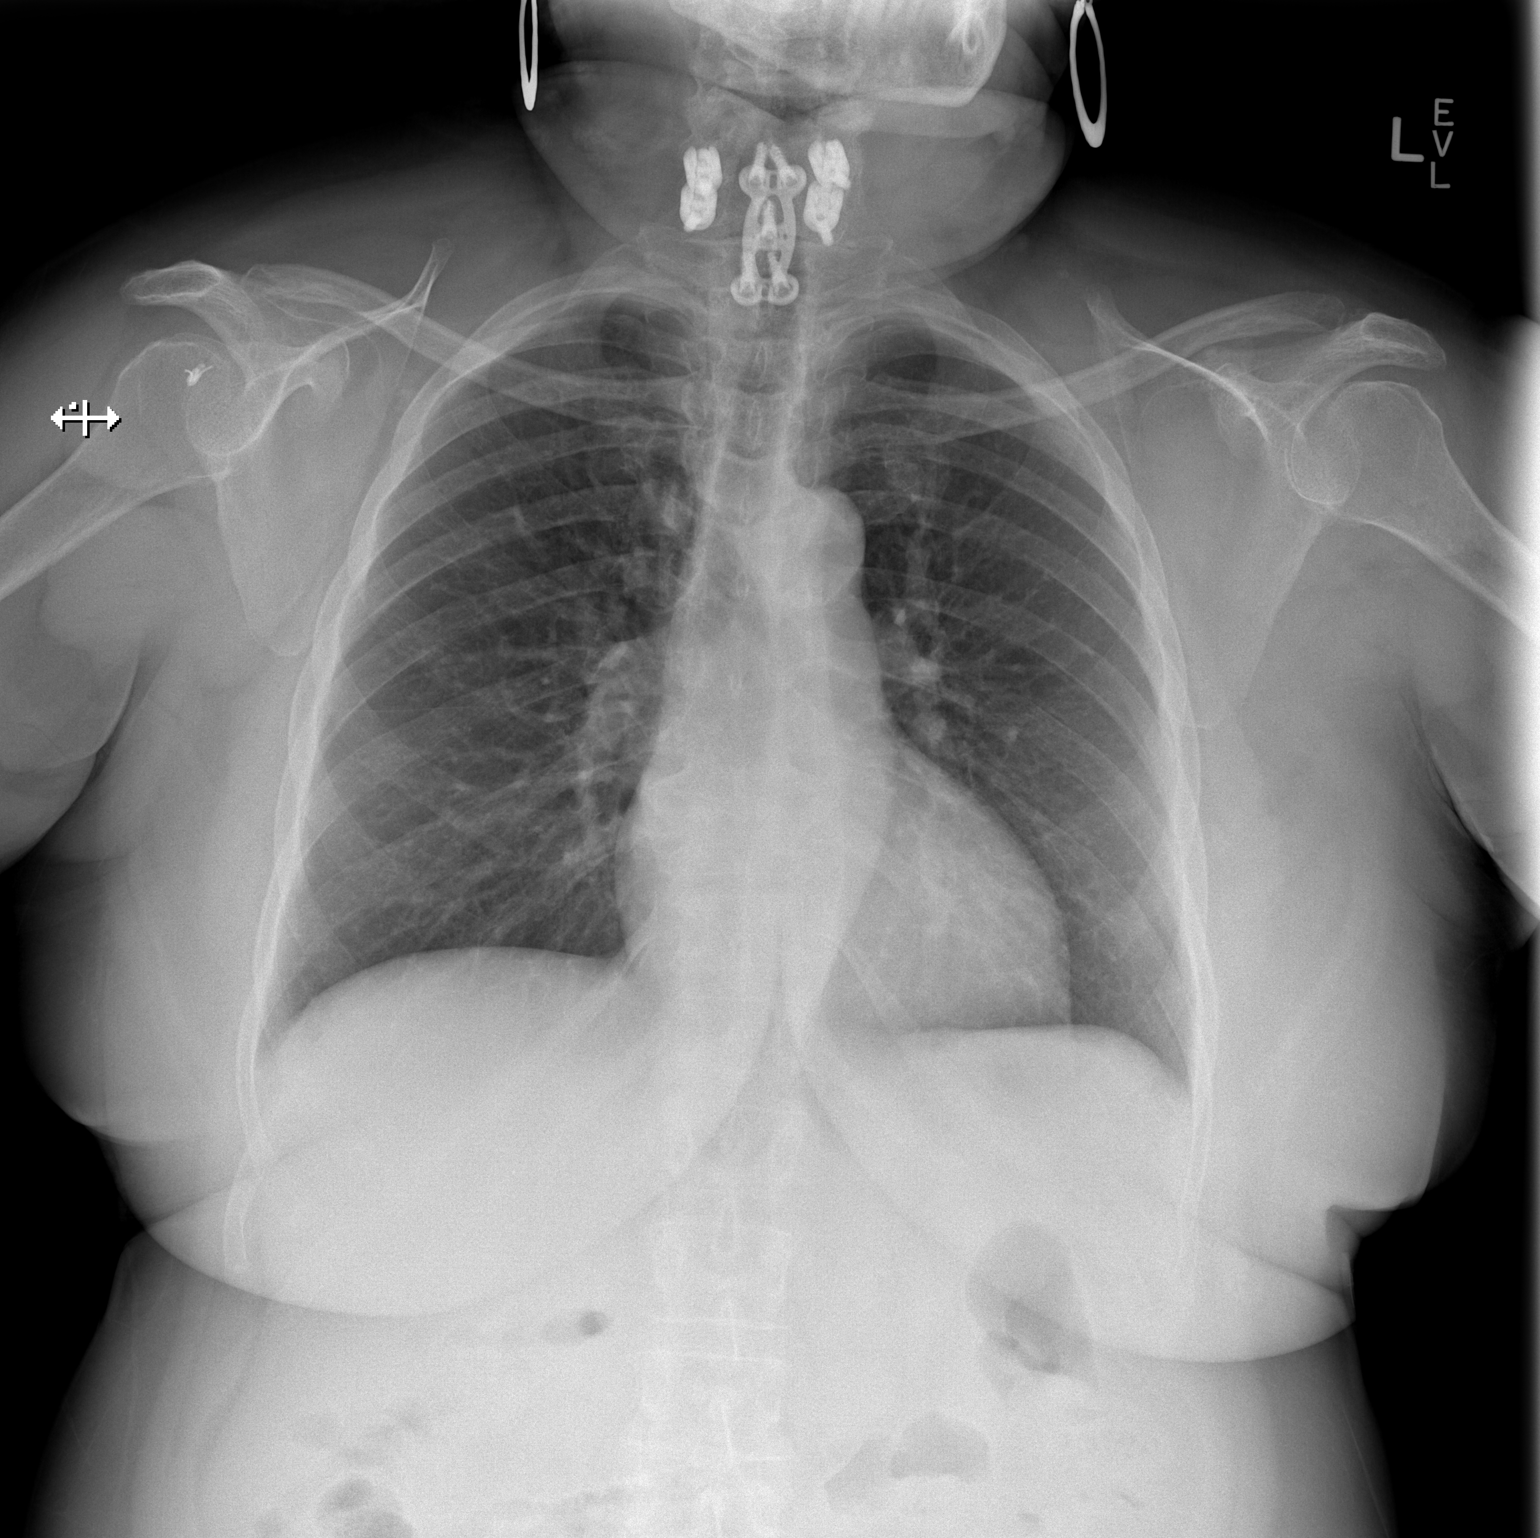

[w chest lat]
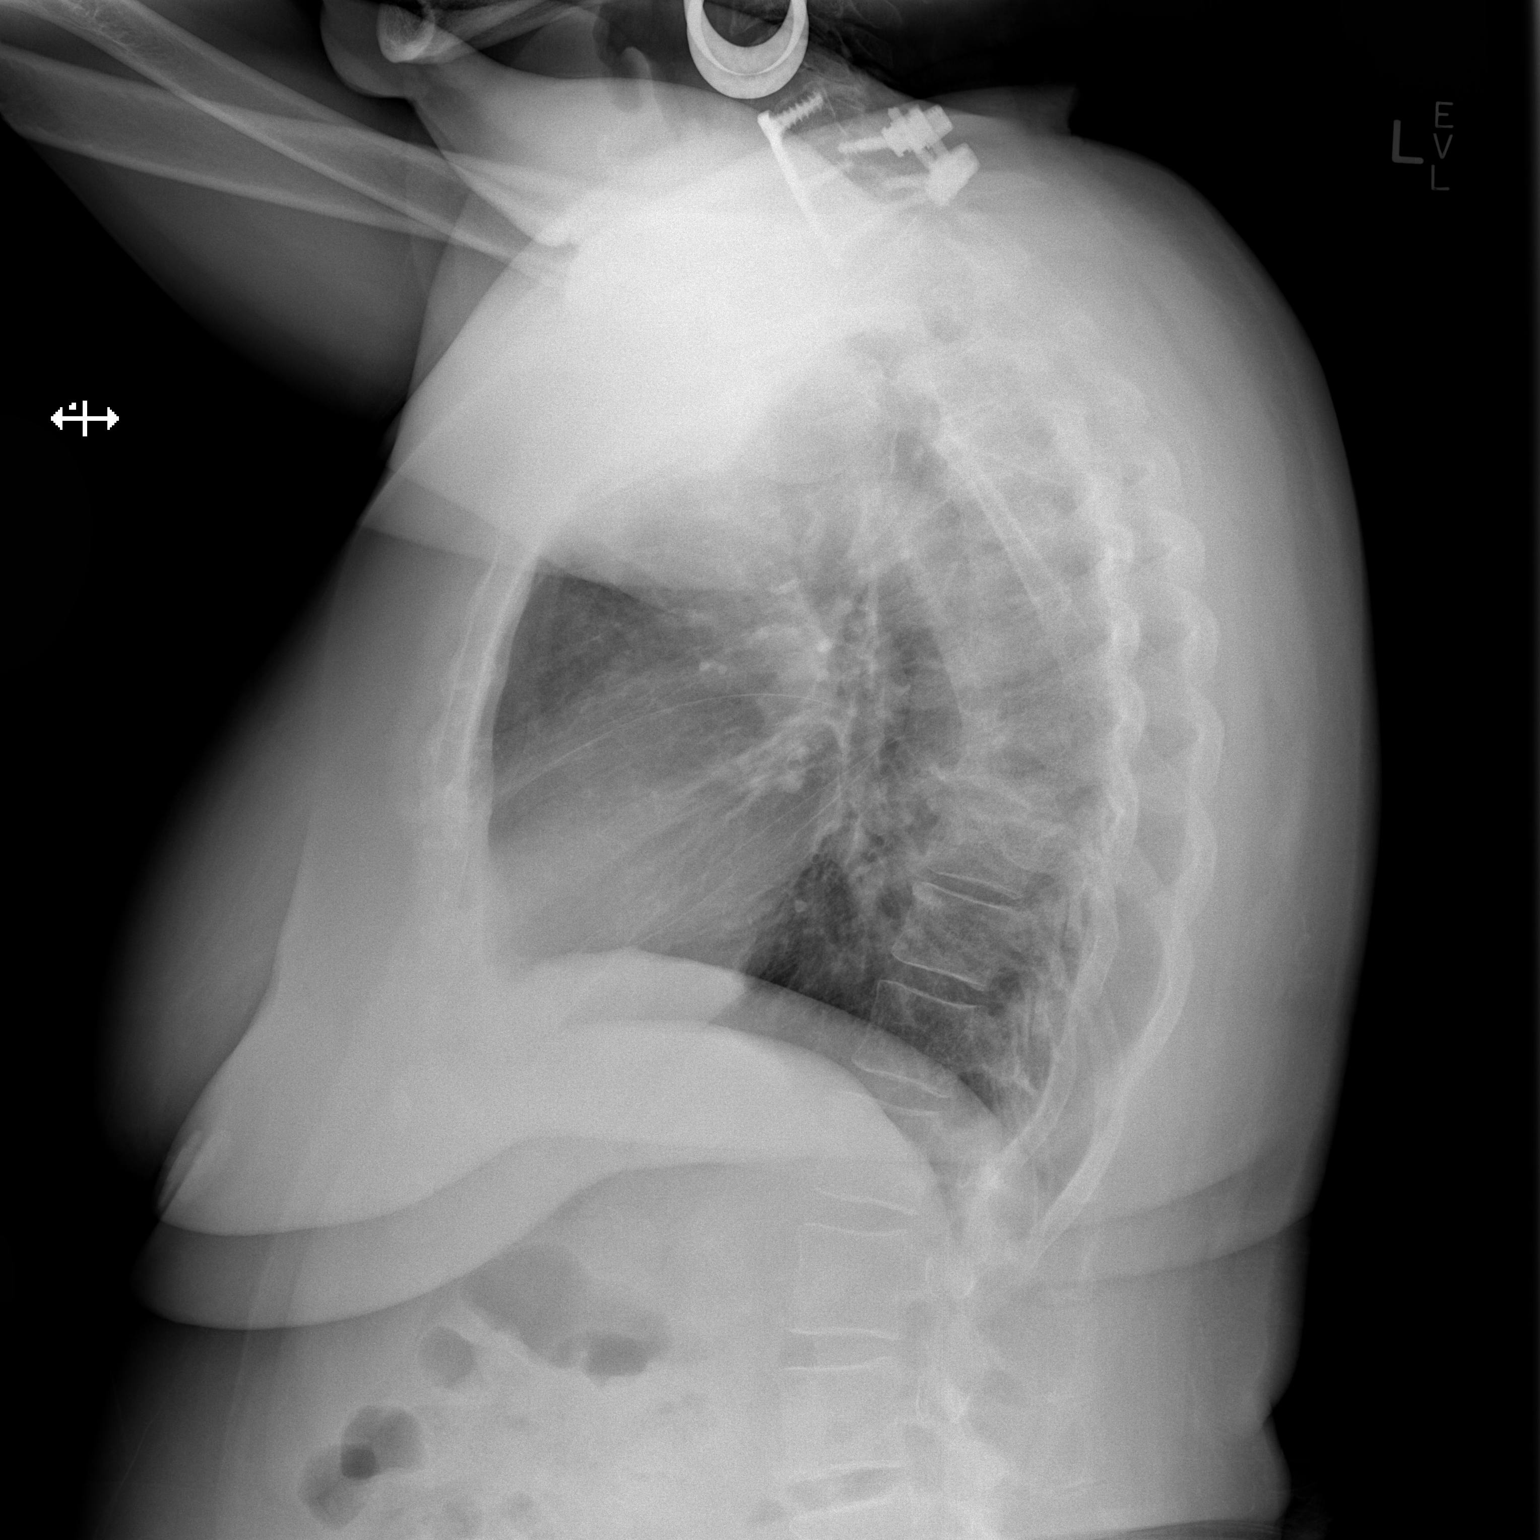

[2 of 2 positions shown; findings below may reference images not displayed]

FINDINGS: The heart size and mediastinal contours are within normal limits.
Both lungs are clear. The visualized skeletal structures are
unremarkable.
IMPRESSION: No active cardiopulmonary disease.

## 2018-09-03 IMAGING — DX DG SHOULDER 2+V*L*
3 series · 3 of 3 positions shown · non-contrast
Comparison: Chest radiograph dated 10/23/2015

CLINICAL DATA: 75-year-old female with left shoulder pain. No known
injury.

EXAM:
LEFT SHOULDER - 2+ VIEW

[shoulder axial]
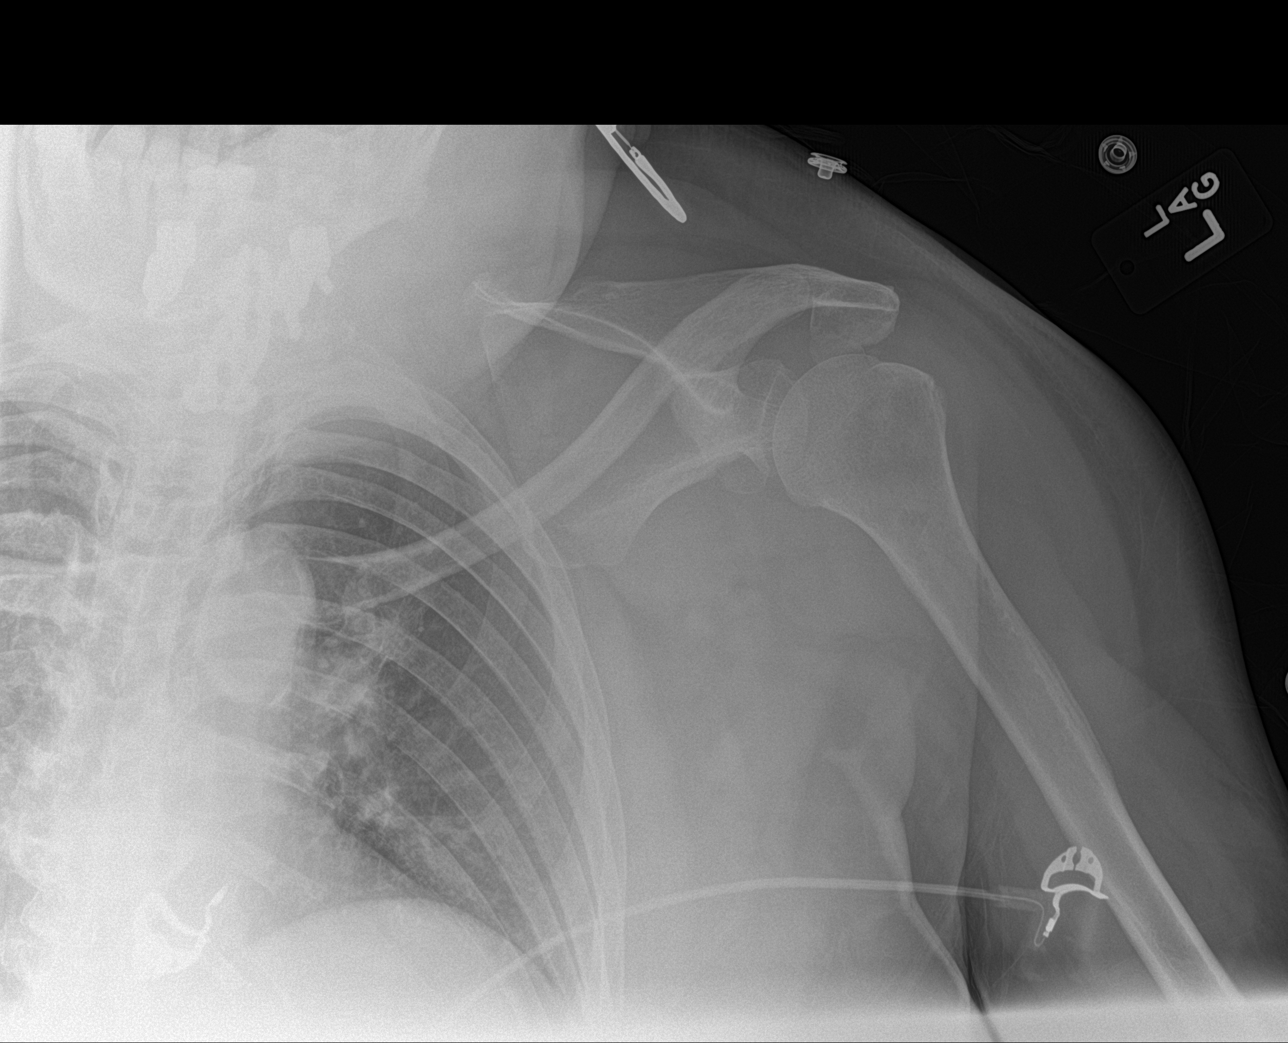

[shoulder ap]
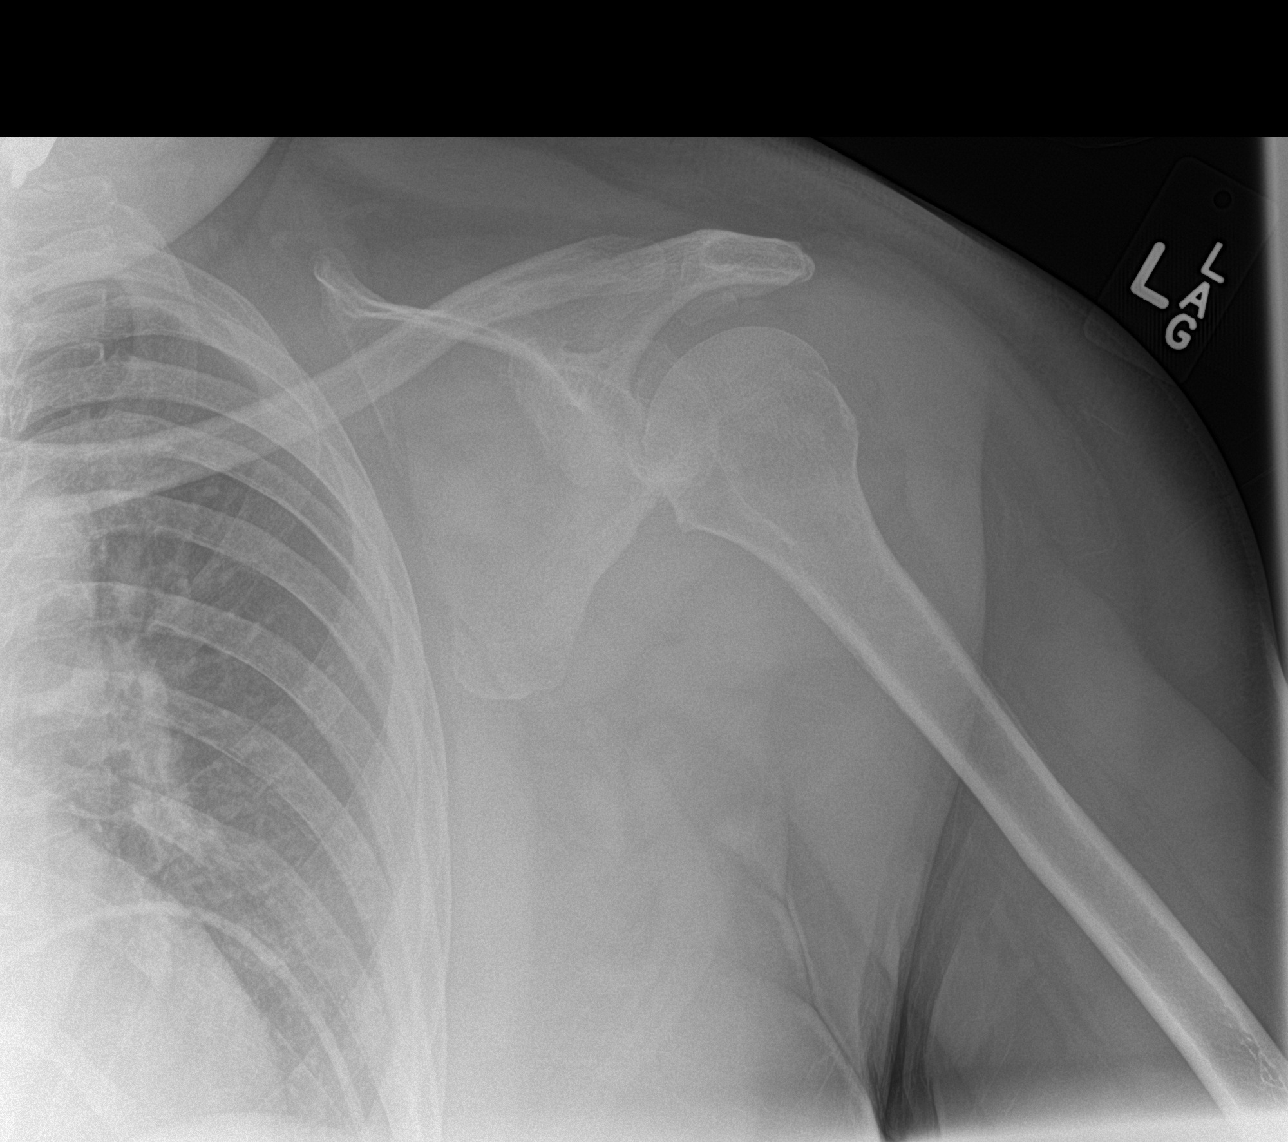

[shoulder obl]
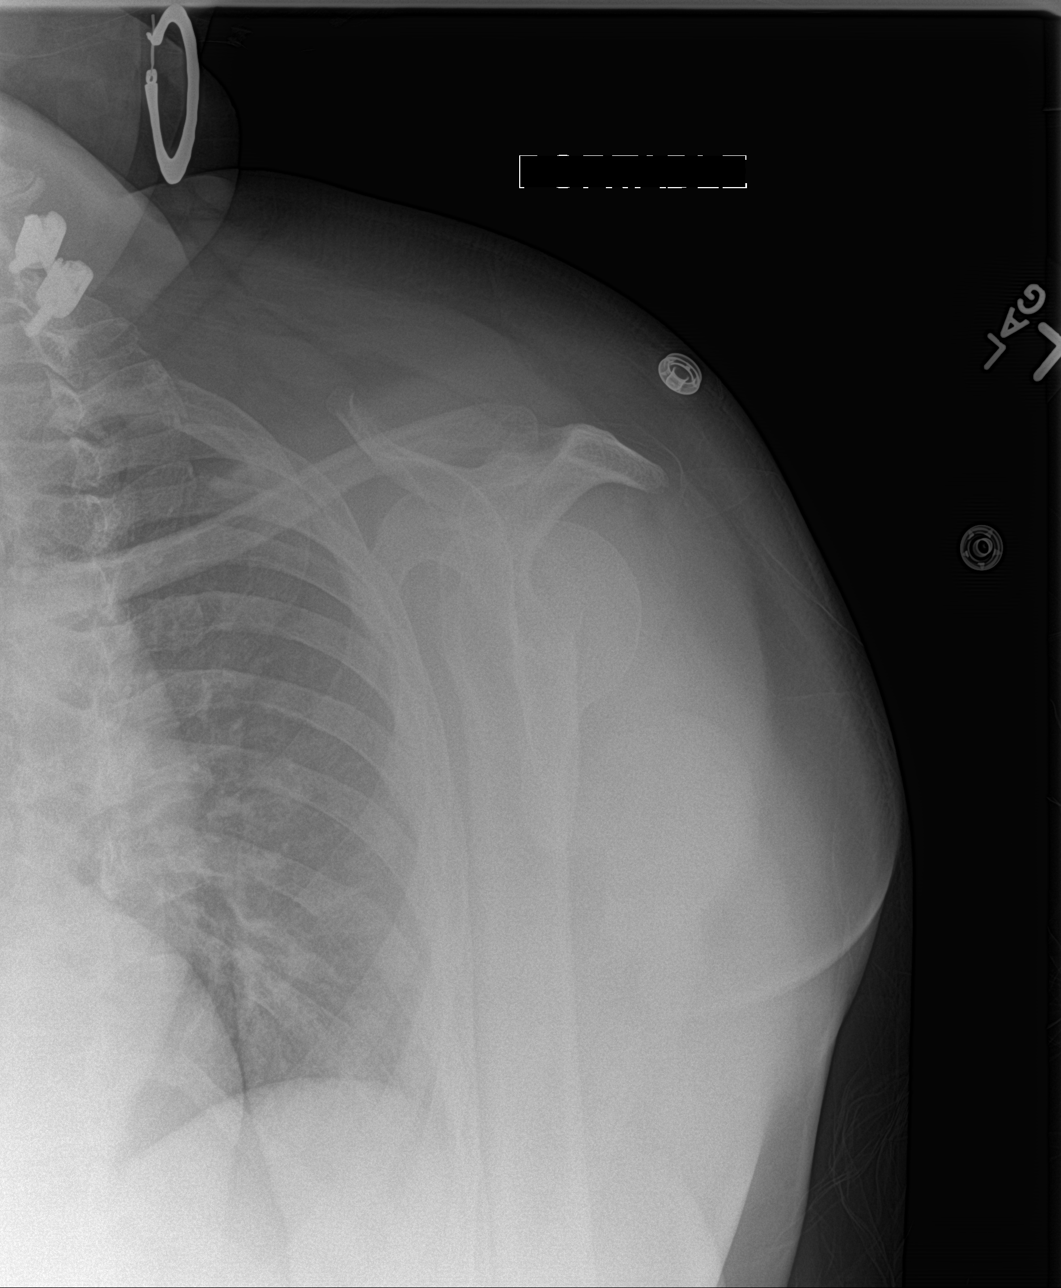

[3 of 3 positions shown; findings below may reference images not displayed]

FINDINGS: There is no acute fracture or dislocation. The bones are mildly
osteopenic. No significant arthritic changes. There is a small well
corticated bony fragment superior to the left humeral head and
inferior to the acromion likely representing an intra-articular
fragment. The soft tissues appear unremarkable.
IMPRESSION: No acute fracture or dislocation.

Small intra-articular fragment superior to the humeral head.

## 2018-09-04 IMAGING — MR MR SHOULDER*L* W/O CM
4 of 5 series · 19 of 40 positions shown · non-contrast
Comparison: None.

CLINICAL DATA: Left shoulder pain. History of renal cell carcinoma.
Limited range of motion.

EXAM:
MRI OF THE LEFT SHOULDER WITHOUT CONTRAST
TECHNIQUE: Multiplanar, multisequence MR imaging of the shoulder was performed.
No intravenous contrast was administered.

[Series 5: T2 fat-sat · axial · 4.0mm · 0.23mm/px · z∈[-63,+12]mm · 6 of 19 slices shown (1 of 3)]
[im 1/19]
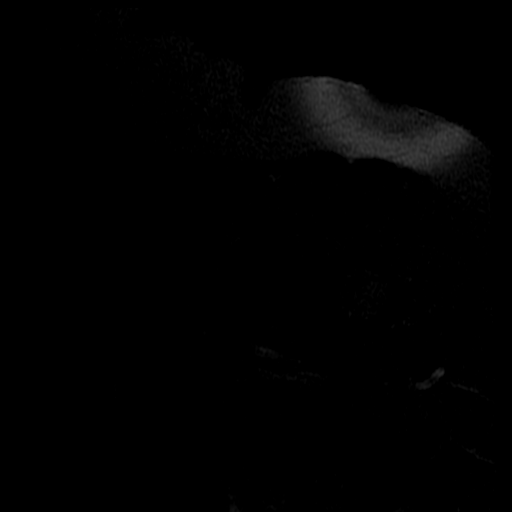
[im 3/19]
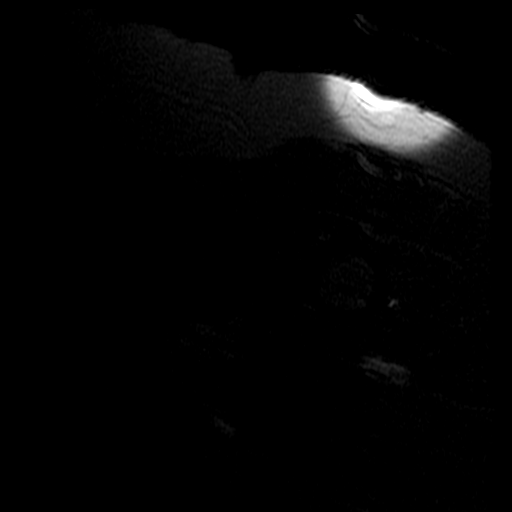
[im 5/19]
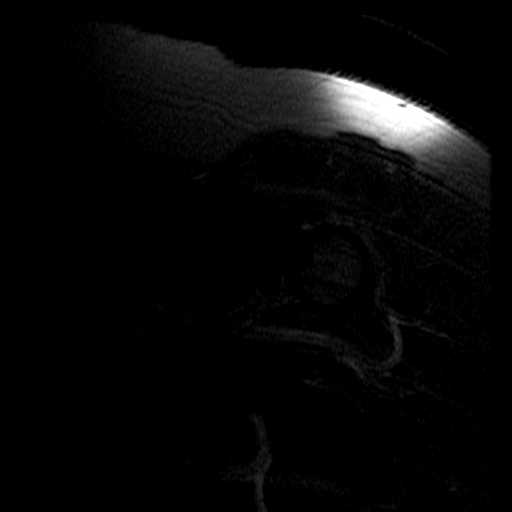
[im 7/19]
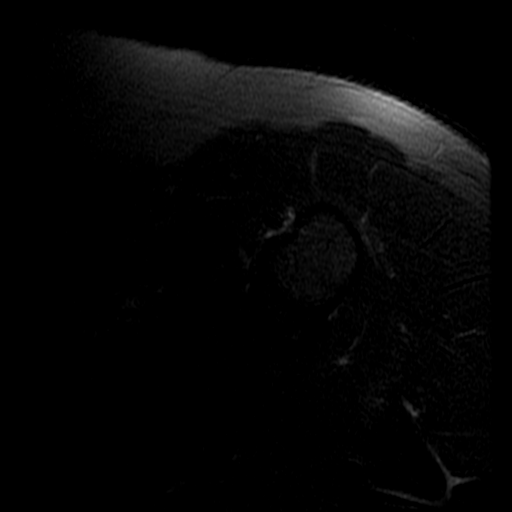
[im 10/19]
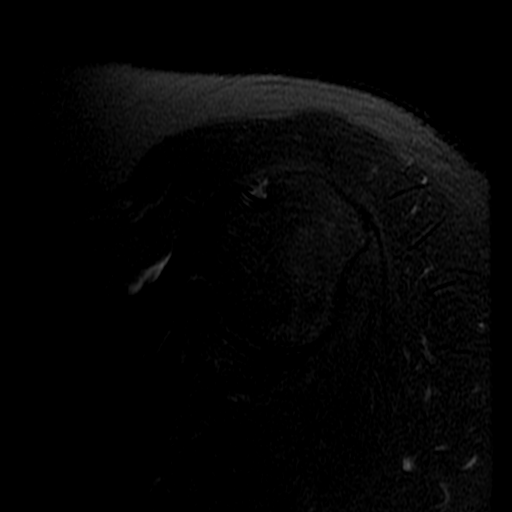
[im 16/19]
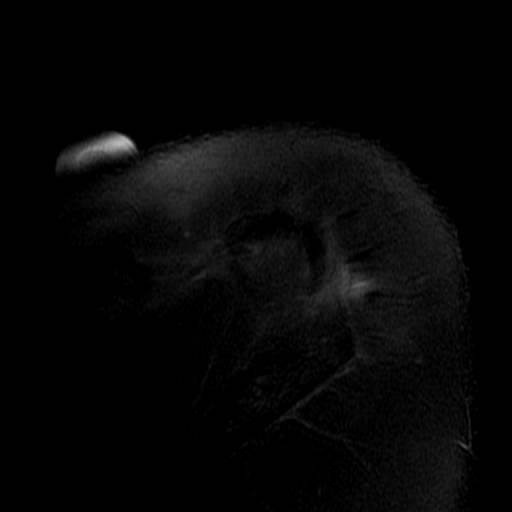

[Series 7: PD · sagittal · 4.0mm · 0.29mm/px · 7 of 15 slices shown]
[im 1/15]
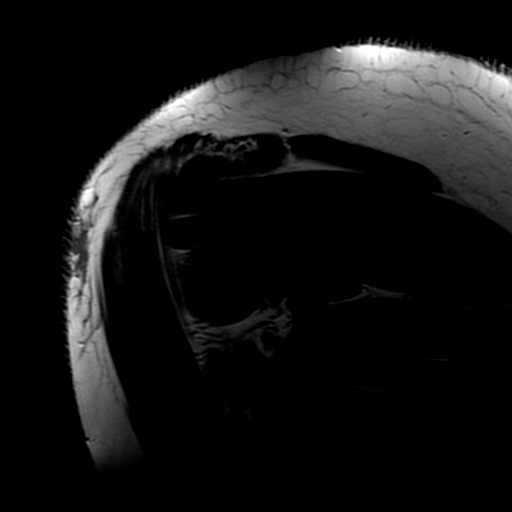
[im 3/15]
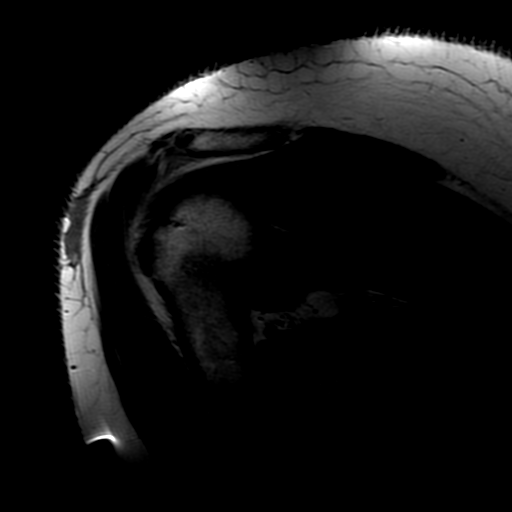
[im 5/15]
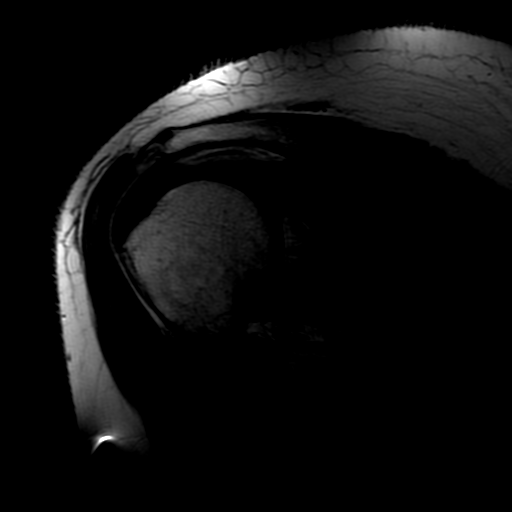
[im 8/15]
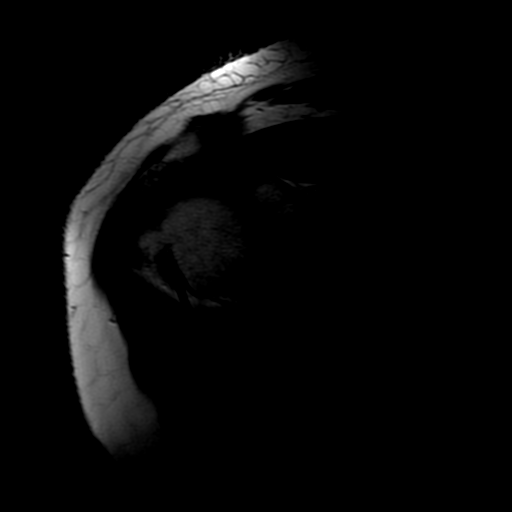
[im 10/15]
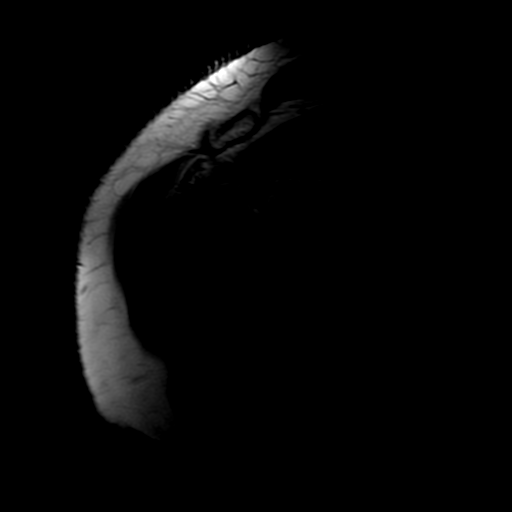
[im 12/15]
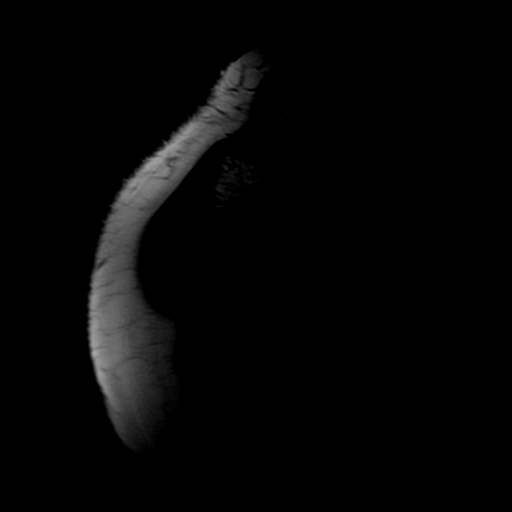
[im 15/15]
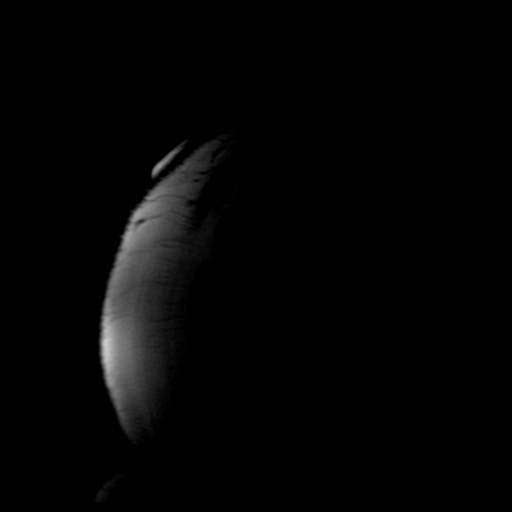

[Series 9: T2 fat-sat · coronal · 4.0mm · 0.29mm/px · 3 of 15 slices shown (2 of 3)]
[im 3/15]
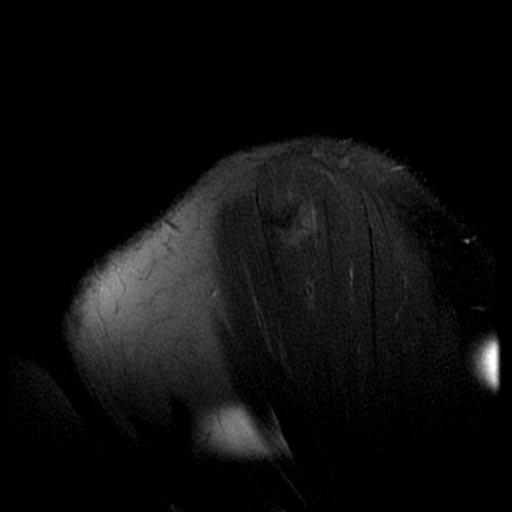
[im 9/15]
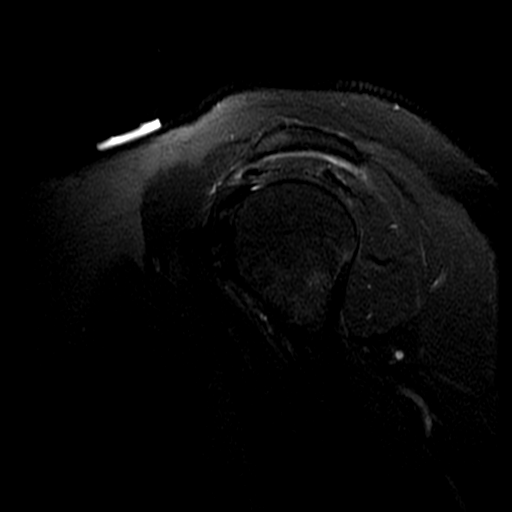
[im 13/15]
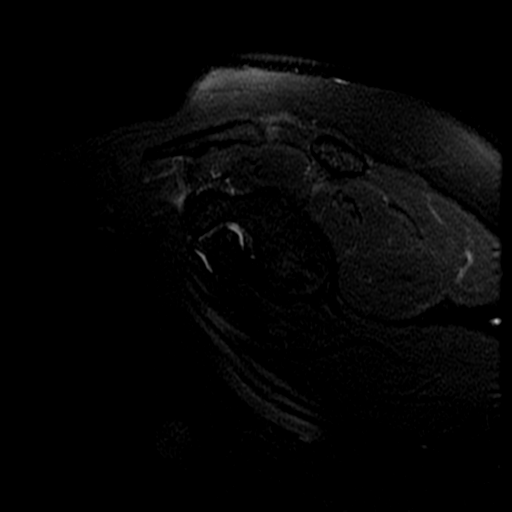

[Series 10: T2 fat-sat · sagittal · 4.0mm · 0.29mm/px · 3 of 15 slices shown (3 of 3)]
[im 3/15]
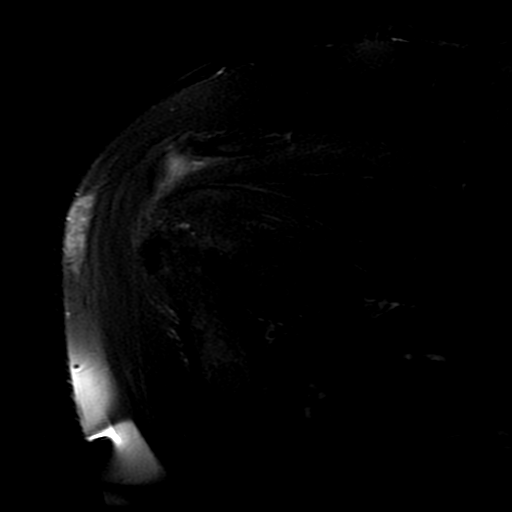
[im 9/15]
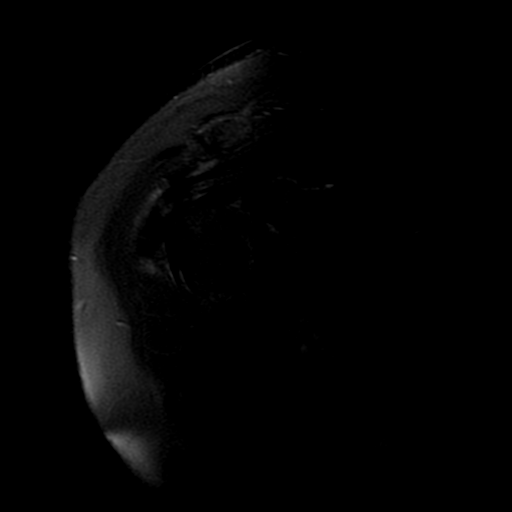
[im 13/15]
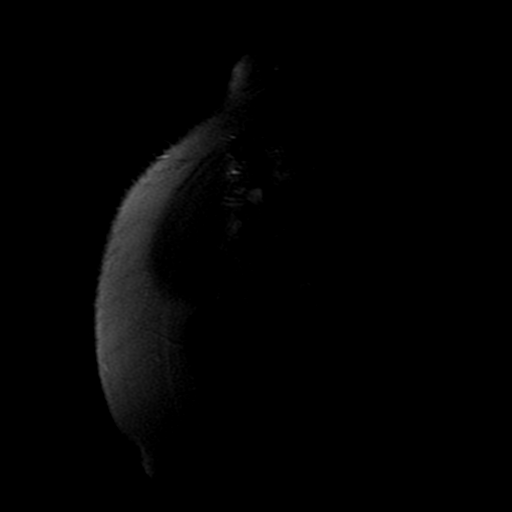

[19 of 40 positions shown; findings below may reference images not displayed]

FINDINGS: Rotator cuff: Severe tendinosis of the supraspinatus tendon with a
small interstitial tear. Moderate tendinosis of the infraspinatus
tendon with fraying along the bursal surface. Teres minor tendon is
intact. Mild tendinosis of the subscapularis tendon.

Muscles: No atrophy or fatty replacement of nor abnormal signal
within, the muscles of the rotator cuff.

Biceps long head:  Intact.

Acromioclavicular Joint: Mild arthropathy of the acromioclavicular
joint. Type II acromion. No significant subacromial/ subdeltoid
bursal fluid.

Glenohumeral Joint: No joint effusion.  No chondral defect.

Labrum: Grossly intact, but evaluation is limited by lack of
intraarticular fluid.

Bones: No focal marrow signal abnormality. No fracture or
dislocation. No aggressive osseous lesion.

Other: None.
IMPRESSION: 1. Severe tendinosis of the supraspinatus tendon with a small
interstitial tear.
2. Moderate tendinosis of the infraspinatus tendon with fraying
along the bursal surface.
3. Mild tendinosis of the subscapularis tendon.

## 2018-09-04 IMAGING — MR MR CERVICAL SPINE W/O CM
4 of 5 series · 18 of 48 positions shown · IV contrast (Yes)
Comparison: CT cervical spine June 08, 2014

CLINICAL DATA: Neck and LEFT shoulder pain, LEFT arm numbness and
tingling. History of renal cell carcinoma, diabetes, hypertension.

EXAM:
MRI CERVICAL SPINE WITHOUT CONTRAST
TECHNIQUE: Multiplanar, multisequence MR imaging of the cervical spine was
performed. No intravenous contrast was administered.

[Series 2: T1 · sagittal · 3.0mm · 0.41mm/px · 3 of 15 slices shown]
[im 3/15]
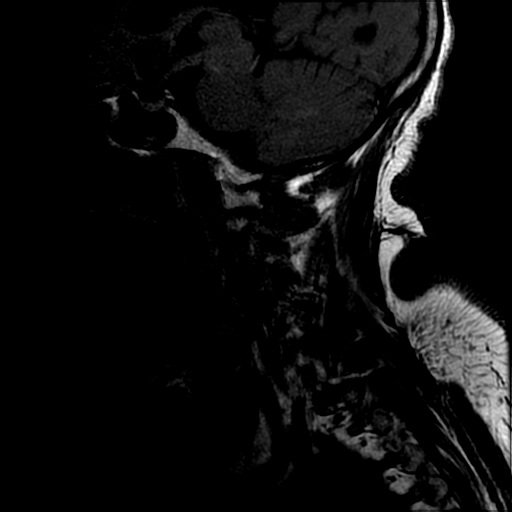
[im 9/15]
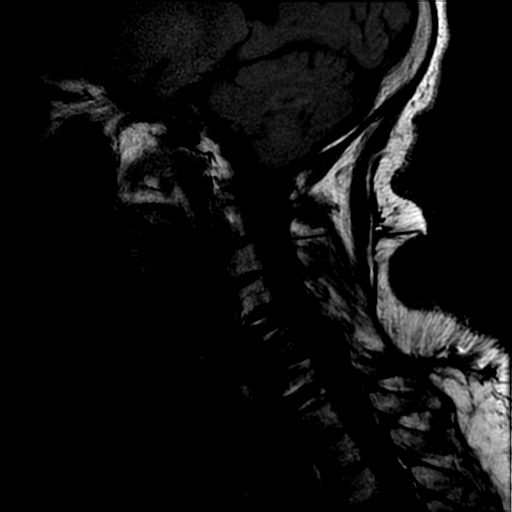
[im 15/15]
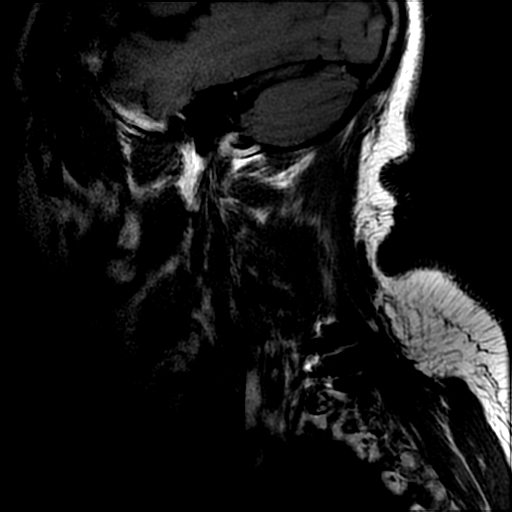

[Series 3: sag ir · sagittal · 3.0mm · 0.41mm/px · 3 of 15 slices shown]
[im 3/15]
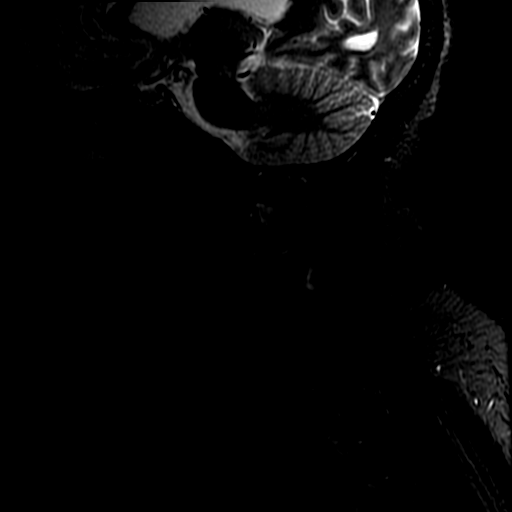
[im 8/15]
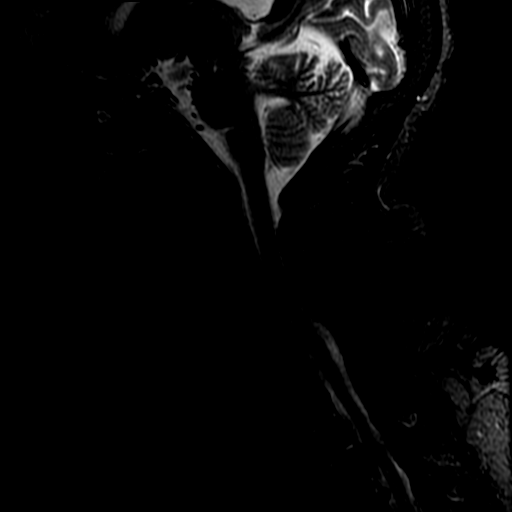
[im 12/15]
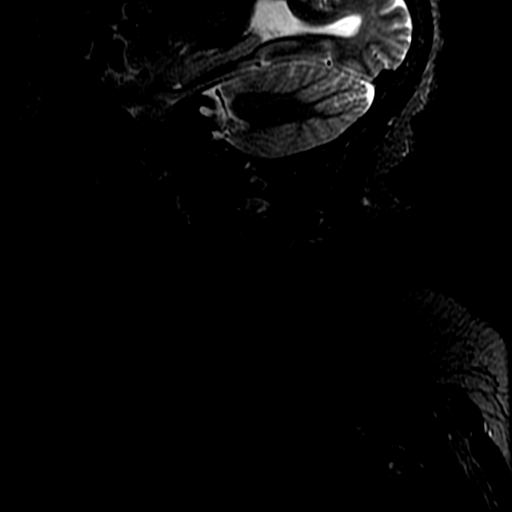

[Series 4: T2 · sagittal · 3.0mm · 0.41mm/px · 7 of 15 slices shown (1 of 2)]
[im 1/15]
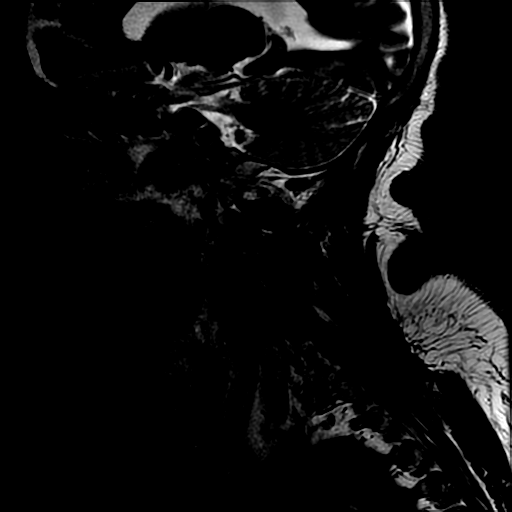
[im 3/15]
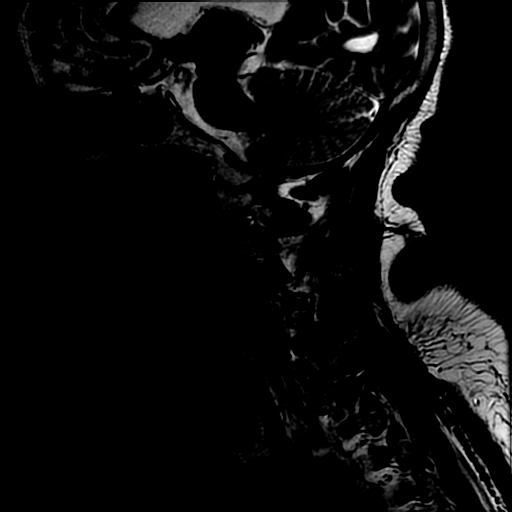
[im 5/15]
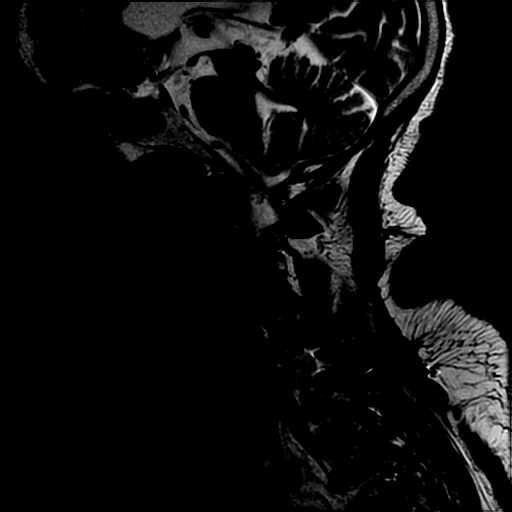
[im 8/15]
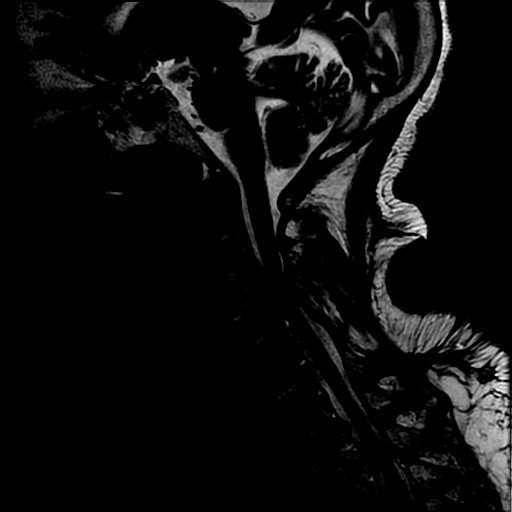
[im 10/15]
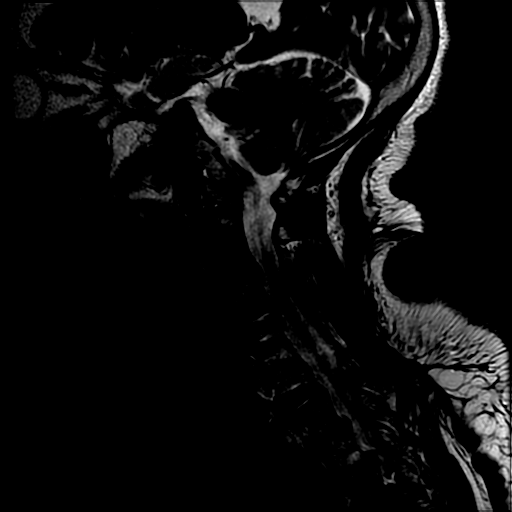
[im 12/15]
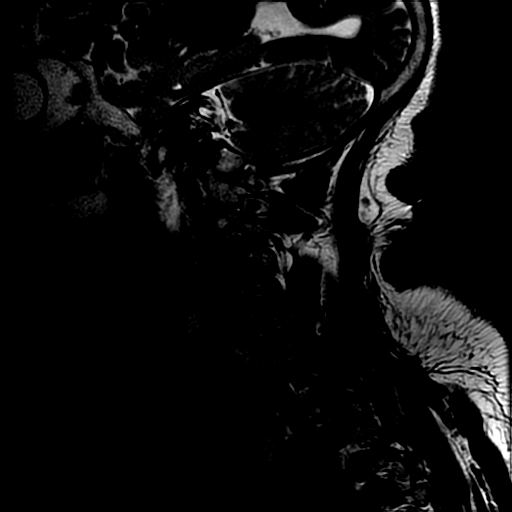
[im 15/15]
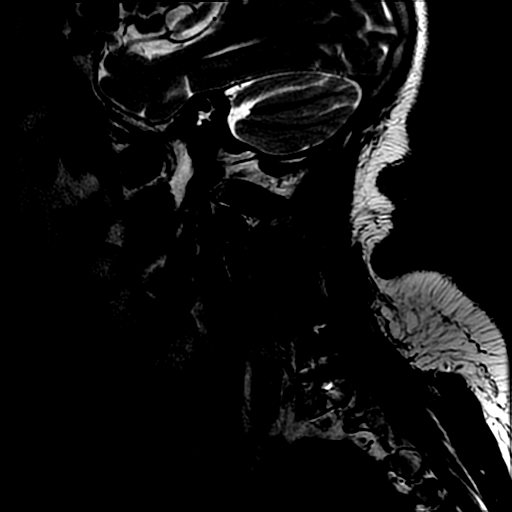

[Series 6: T2 · axial · 3.0mm · 0.35mm/px · z∈[-59,+18]mm · 5 of 31 slices shown (2 of 2)]
[im 1/31]
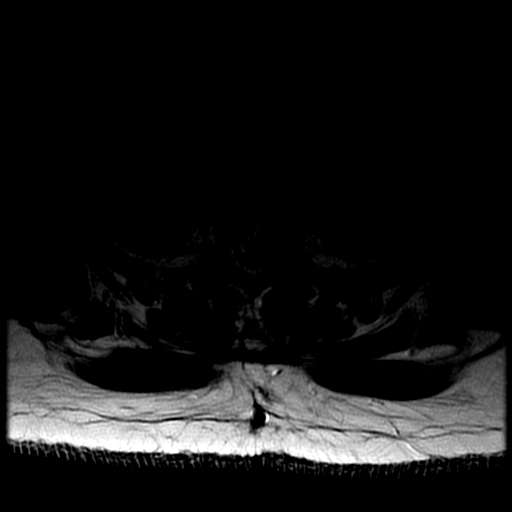
[im 5/31]
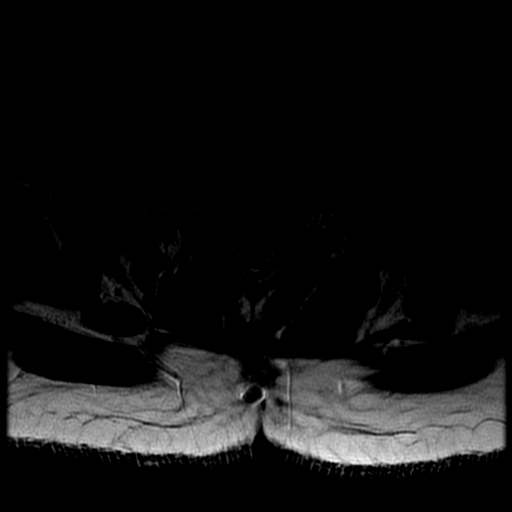
[im 10/31]
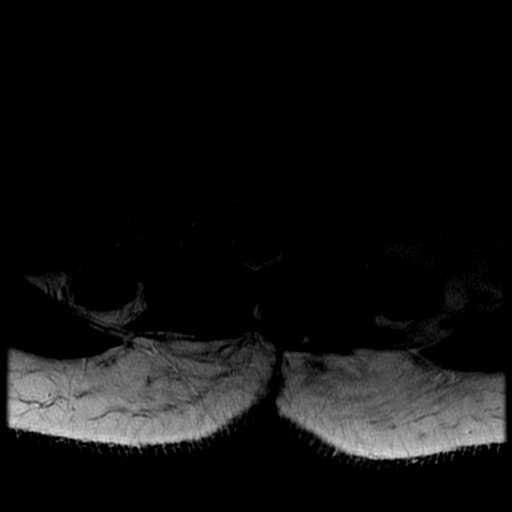
[im 17/31]
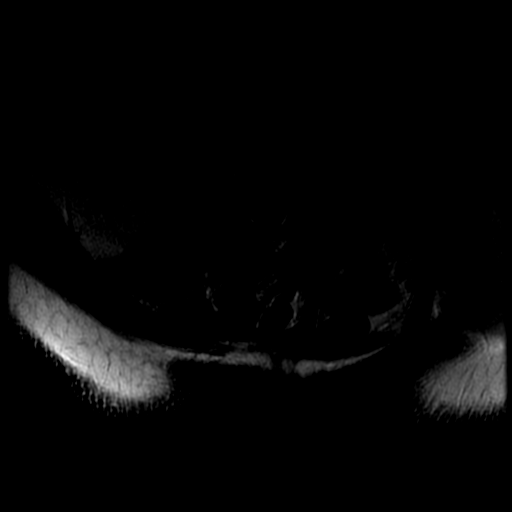
[im 26/31]
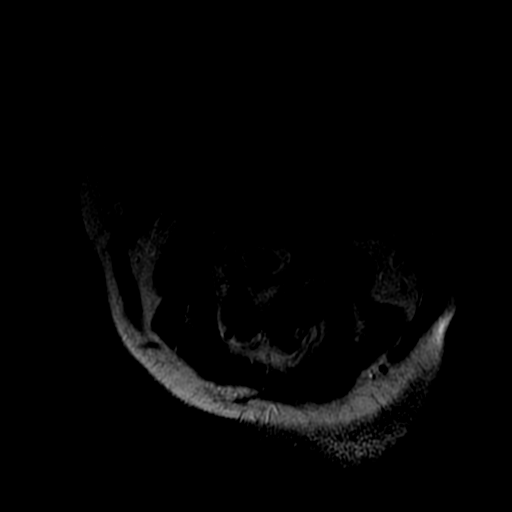

[18 of 48 positions shown; findings below may reference images not displayed]

FINDINGS: Multiple sequences are mildly or moderately motion degraded.

ALIGNMENT: Straightened cervical lordosis.  No malalignment.

VERTEBRAE/DISCS: Vertebral bodies are intact. Status post C5-6 and
C6-7 ACDF, C6-7 facet screws; hardware results in local
susceptibility defect. Arthrodesis better demonstrated on prior CT.
Similar moderate C3-4 disc height loss, mild at C4-5. Decreased T2
signal within all non surgically altered disc compatible with
desiccation. Mild chronic discogenic endplate changes C3-4 and C4-5.
No STIR signal abnormality to suggest a acute process in the
cervical spine. However, moderate T1-2, T2-3 and T3-4 disc bulges,
associated with acute on chronic discogenic endplate changes. No
upper thoracic canal stenosis. Multilevel moderate to severe/ severe
neural foraminal narrowing T1-2 through T3-4.

CORD:Cervical spinal cord is normal morphology and signal
characteristics from the cervicomedullary junction to level of T1-2,
the most caudal well visualized level.

POSTERIOR FOSSA, VERTEBRAL ARTERIES, PARASPINAL TISSUES: No MR
findings of ligamentous injury. Vertebral artery flow voids present.
Included posterior fossa and paraspinal soft tissues are nonacute;
posterior perispinal scarring.

DISC LEVELS:

C2-3:  No disc bulge, canal stenosis nor neural foraminal narrowing.

C3-4: Moderate broad-based disc bulge, uncovertebral hypertrophy.
Mild canal stenosis. Moderate bilateral neural foraminal narrowing.

C4-5: Uncovertebral hypertrophy. Severe LEFT and mild RIGHT facet
arthropathy. No canal stenosis. Mild RIGHT and moderate to severe
LEFT neural foraminal narrowing.

C5-6, C6-7: ACDF, posterior decompression without canal stenosis.
Neural foramen obscured by hardware artifact.

C7-T1: No disc bulge. Mild facet arthropathy without canal stenosis.
Moderate bilateral suspected neural foraminal narrowing may be
overestimated by hardware artifact.
IMPRESSION: Status post C5-6 and C6-7 ACDF and posterior decompression. No acute
fracture or malalignment.

Mild canal stenosis C3-4. Multilevel neural foraminal narrowing:
Moderate to severe on the LEFT at C4-5 most compatible with adjacent
segment disease.

Degenerative upper thoracic spine resulting in multilevel moderate
to severe/severe neural foraminal narrowing.

## 2018-09-07 ENCOUNTER — Encounter: Payer: Self-pay | Admitting: Neurology

## 2018-09-07 ENCOUNTER — Other Ambulatory Visit: Payer: Self-pay

## 2018-09-07 ENCOUNTER — Ambulatory Visit (INDEPENDENT_AMBULATORY_CARE_PROVIDER_SITE_OTHER): Payer: Medicare Other | Admitting: Neurology

## 2018-09-07 VITALS — BP 154/83 | HR 68 | Ht 59.0 in | Wt 151.0 lb

## 2018-09-07 DIAGNOSIS — R413 Other amnesia: Secondary | ICD-10-CM

## 2018-09-07 DIAGNOSIS — G4733 Obstructive sleep apnea (adult) (pediatric): Secondary | ICD-10-CM | POA: Diagnosis not present

## 2018-09-07 DIAGNOSIS — Z9989 Dependence on other enabling machines and devices: Secondary | ICD-10-CM | POA: Diagnosis not present

## 2018-09-07 NOTE — Patient Instructions (Signed)
Keep up the good work! We can see you in 6 months. You can see one of our nurse practitioners as you are stable.  Your memory scores are stable as well, we will continue to monitor.  Please continue to take your memory medication as prescribed by Dr. Berdine Addison.

## 2018-09-07 NOTE — Progress Notes (Signed)
Subjective:    Patient ID: [name] is a 78 y.o. female.  HPI     Interim history:   [name] is a 78 year old right-handed woman with an underlying complex medical history of diabetes, asthma, reflux disease, hypertension, hyperlipidemia, kidney cancer with status post left nephrectomy, sleep apnea, vertigo, chronic kidney disease, arthritis and obesity, who presents for follow-up consultation of her memory loss and sleep apnea.  The patient is unaccompanied today.  I last saw her on 03/05/2018, at which time she was doing fairly well, she was compliant with CPAP and we talked about recent test results including her sleep studies.  She was on Aricept and tolerating it, MMSE was 29 at the time.  Today, 09/07/2018: I reviewed her CPAP compliance data from 08/04/2018 through 09/02/2018 which is a total of 30 days, during which time she used her machine every night with percent used days greater than 4 hours at 97%, indicating excellent compliance with an average usage of 7 hours and 42 minutes, residual AHI 3.6/h, leak acceptable with a 95th percentile at 11.2 L/min on a pressure of 9 cm with EPR of 3.  She reports Feeling stable, she drove herself to the appointment today.  She has not had any acute illness.  She continues to take generic Aricept daily per PCP, she had a recent appointment with her primary care physician as well.  Of note, she was hospitalized in February secondary to pyelonephritis.  She tries to hydrate well with water.She received nasal inserts for the nasal pillows of her CPAP machine, she is not sure how to change the inserts.  She did not bring her supplies today unfortunately.   The patient's allergies, current medications, family history, past medical history, past social history, past surgical history and problem list were reviewed and updated as appropriate.    Previously:   I first met her on 07/09/2017 at the request of her nephrologist, at which time the patient  reported short-term memory issues as well as a prior diagnosis of OSA. Her MMSE was 27 at the time. I suggested we proceed with further testing including brain MRI and sleep studies. She had a brain MRI without contrast on 07/22/2017 and I reviewed the results: IMPRESSION: Slightly abnormal MRI scan of the brain showing age-appropriate changes of chronic microvascular ischemia and generalized cerebral atrophy.  No significant change compared with previous CT head dated 06/01/2017. She had a baseline sleep study on 07/29/2017, followed by a CPAP titration study. I went over her test results with her in detail today. Baseline sleep study from 07/29/2017 showed a sleep latency of 30.5 minutes, REM latency of 129.5 minutes, sleep efficiency reduced at 77.3%. Total AHI was in the mild range of 5.6 per hour, average oxygen saturation 94%, nadir of 86%. Based on her medical history I suggested we proceed with treatment of her mild sleep apnea with CPAP. She had a titration study on 08/26/2017. She was fitted with nasal pillows and CPAP was titrated from 5 cm to 8 cm. On the final pressure her AHI was 0 per hour with nonsupine non-REM sleep achieved an O2 nadir of 93%. Sleep efficiency was markedly reduced with a very long sleep latency. Nevertheless, I suggested she try home CPAP therapy at a pressure of 8 cm via nasal pillows.  I reviewed her CPAP compliance data from 02/02/2018 through 03/03/2018 which is a total of 30 days, during which time she used her CPAP every night with percent used days greater  than 4 hours at 100%, indicating superb compliance with an average usage of 8 hours and 4 minutes, residual AHI at goal at 1.9 per hour, leak acceptable with the 95th percentile at 13.1 L/m on a pressure of 9 cm with EPR of 3.    07/09/17: (She) reports short-term memory loss for the past year. She has issues remembering names and dates, short-term memory loss. She does not have a family history of dementia as far she  knows. She is an only child. She has one daughter who lives in [place] and her son died at the age of 25. She is widowed and lives alone. She believes that her memory loss is due to age. I reviewed your office note from 05/16/2017, which you kindly included. She has been on Aricept.   She has a diagnosis of sleep apnea but could not tolerate CPAP. She would be willing to consider CPAP again.    She had a brain MRI without contrast on 10/04/2005 and I reviewed the results: IMPRESSION:  1. No acute intracranial abnormality.   2. Greater than expected subcortical T2 hyperintensities bilaterally. These were described on previous MRI of 2002. They remain non specific. Differential diagnosis includes microvascular ischemia, chronic migraine headaches, demyelinating process or vasculitis or the sequelae of prior infection or inflammation.   3. Mild cerebellar tonsillar ectopia. This is likely to be incidental.   She had a head CT without contrast on 06/01/2017 and I reviewed the results: IMPRESSION: No acute intracranial abnormalities. She presented to the emergency room on 06/01/2017 with blurry vision, abdominal pain, nausea and dizziness. I reviewed the emergency room records. She was treated symptomatically with antiemetic medication and IV fluids for suspected viral GI illness. Her Past Medical History Is Significant For: Past Medical History:  Diagnosis Date  . ACS (acute coronary syndrome) ([facility]) 12/27/2010  . AKI (acute kidney injury) ([facility]) 03/25/2018  . Anginal pain ([facility])   . Arthritis   . Asthma   . Diabetes mellitus    Type 1  . Dysrhythmia    "irregular heart beat"  . GERD (gastroesophageal reflux disease)   . History of urinary tract infection   . Hypercholesteremia   . Hypertension   . left renal ca dx'd 2012 (?)   surg only, left kidney  . Nocturia   . Reflux   . Renal failure ([facility]F), acute on chronic ([facility])   . Shortness of breath dyspnea    pt denies; states can climb stairs  w/o difficulty   . Sleep apnea    does not use c-pap machine  . Tingling in extremities    legs bilat  . Tinnitus   . Urinary frequency   . Urinary incontinence   . Vertigo    occurs when lying flat     Her Past Surgical History Is Significant For: Past Surgical History:  Procedure Laterality Date  . ABDOMINAL HYSTERECTOMY    . APPENDECTOMY    . CARDIAC CATHETERIZATION N/A 07/28/2014   Procedure: Left Heart Cath and Coronary Angiography;  Surgeon: [name], MD;  Location: [facility];  Service: Cardiovascular;  Laterality: N/A;  . CYSTOSCOPY WITH RETROGRADE PYELOGRAM, URETEROSCOPY AND STENT PLACEMENT Right 02/09/2015   Procedure: CYSTOSCOPY WITH RETROGRADE PYELOGRAM AND URETEROSCOPY ;  Surgeon: Raynelle Bring, MD;  Location: [facility];  Service: Urology;  Laterality: Right;  . LAPAROSCOPIC NEPHRECTOMY Right 03/20/2015   Procedure: LAPAROSCOPIC NEPHRECTOMY;  Surgeon: Raynelle Bring, MD;  Location: [facility];  Service: Urology;  Laterality: Right;  .  PERIPHERAL VASCULAR CATHETERIZATION N/A 07/28/2014   Procedure: Aortic Arch Angiography;  Surgeon: [name], MD;  Location: [facility];  Service: Cardiovascular;  Laterality: N/A;  . RENAL MASS EXCISION    . SPINE SURGERY      Her Family History Is Significant For: Family History  Problem Relation Age of Onset  . Hypertension Mother   . Coronary artery disease Other   . Breast cancer Cousin     Her Social History Is Significant For: Social History   Socioeconomic History  . Marital status: Widowed    Spouse name: Not on file  . Number of children: 2  . Years of education: Not on file  . Highest education level: Not on file  Occupational History  . Not on file  Social Needs  . Financial resource strain: Not on file  . Food insecurity    Worry: Not on file    Inability: Not on file  . Transportation needs    Medical: Not on file    Non-medical: Not on file  Tobacco Use  . Smoking status: Never Smoker  . Smokeless  tobacco: Never Used  Substance and Sexual Activity  . Alcohol use: No  . Drug use: No    Frequency: 1.0 times per week  . Sexual activity: Never  Lifestyle  . Physical activity    Days per week: Not on file    Minutes per session: Not on file  . Stress: Not on file  Relationships  . Social Herbalist on phone: Not on file    Gets together: Not on file    Attends religious service: Not on file    Active member of club or organization: Not on file    Attends meetings of clubs or organizations: Not on file    Relationship status: Not on file  Other Topics Concern  . Not on file  Social History Narrative  . Not on file    Her Allergies Are:  Allergies  Allergen Reactions  . Sulfa Antibiotics Itching  :   Her Current Medications Are:  Outpatient Encounter Medications as of 09/07/2018  Medication Sig  . albuterol (PROVENTIL,VENTOLIN) 90 MCG/ACT inhaler Inhale 2 puffs into the lungs every 4 (four) hours as needed for wheezing or shortness of breath.   Marland Kitchen amLODipine (NORVASC) 5 MG tablet TAKE 1 TABLET BY MOUTH EVERY DAY  . aspirin EC 81 MG tablet Take 81 mg by mouth at bedtime.  Marland Kitchen atorvastatin (LIPITOR) 10 MG tablet Take 10 mg by mouth every morning.   . cholecalciferol (VITAMIN D) 1000 units tablet Take 1,000 Units by mouth daily.  Marland Kitchen donepezil (ARICEPT) 5 MG tablet Take 5 mg by mouth daily.  Marland Kitchen gabapentin (NEURONTIN) 300 MG capsule Take 1 capsule (300 mg total) by mouth at bedtime.  Marland Kitchen HYDROcodone-acetaminophen (NORCO/VICODIN) 5-325 MG tablet Take 1 tablet by mouth every 6 (six) hours as needed for up to 15 doses for severe pain.  . Insulin Degludec (TRESIBA FLEXTOUCH) 200 UNIT/ML SOPN Inject 76 Units into the skin daily before breakfast.   . lidocaine (LIDODERM) 5 % Place 1 patch onto the skin daily. Remove & Discard patch within 12 hours or as directed by MD  . metoprolol succinate (TOPROL-XL) 100 MG 24 hr tablet TAKE 1 TABLET BY MOUTH EVERY DAY  . mirabegron ER  (MYRBETRIQ) 50 MG TB24 tablet Take 50 mg by mouth daily.  . Multiple Vitamins-Minerals (CENTRUM SILVER ADULT 50+ PO) Take 1 tablet by  mouth daily.  . nitroGLYCERIN (NITROSTAT) 0.4 MG SL tablet Place 0.4 mg under the tongue every 5 (five) minutes as needed for chest pain.  . TRADJENTA 5 MG TABS tablet Take 5 mg by mouth daily.   No facility-administered encounter medications on file as of 09/07/2018.   :  Review of Systems:  Out of a complete 14 point review of systems, all are reviewed and negative with the exception of these symptoms as listed below: Review of Systems  Neurological:       Pt presents today to follow up on her cpap and her memory. Pt has questions about the mask on her cpap.    Objective:  Neurological Exam  Physical Exam Physical Examination:   Vitals:   09/07/18 1355  BP: (!) 154/83  Pulse: 68    General Examination: The patient is a very pleasant 78 y.o. female in no acute distress. She appears well-developed and well-nourished and well groomed.   HEENT:Normocephalic, atraumatic, pupils are equal, round and reactive to light and accommodation. Extraocular tracking is good without limitation to gaze excursion or nystagmus noted. Normal smooth pursuit is noted. Hearing is grossly intact. Face is symmetric with normal facial animation and normal facial sensation. Speech is clear with no dysarthria noted. There is no hypophonia. There is no lip, neck/head, jaw or voice tremor. Neck is supple with full range of passive and active motion. There are no carotid bruits on auscultation. Oropharynx exam reveals: mildmouth dryness, adequatedental hygiene andmoderateairway crowding, due to redundant soft palate, tonsils are 1+, Mallampati is class II.  Chest:Clear to auscultation without wheezing, rhonchi or crackles noted.  Heart:S1+S2+0, regular and normal without murmurs, rubs or gallops noted.   Abdomen:Soft, non-tender and non-distended with normal bowel  sounds appreciated on auscultation.  Extremities:There istrace edema around the ankles, left slightly worse than right.  Skin: Warm and dry without trophic changes noted. There are novaricose veins.  Musculoskeletal: exam reveals no obvious joint deformities, tenderness or joint swelling or erythema.   Neurologically:  Mental status: The patient is awake, alert and oriented in all 4 spheres.Herimmediate and remote memory, attention, language skills and fund of knowledge are mildly impaired.There is no evidence of aphasia, agnosia, apraxia or anomia. Speech is clear with normal prosody and enunciation. Thought process is linear. Mood is normaland affect is normal.  On5/22/2019: MMSE: 27/30, CDT: 4/4, AFT: 9/min.  On 03/05/2018: MMSE: 29/30, CDT: 4/4, AFT: 11/min.  On 09/07/2018: MMSE: 28/30, CDT: 4/4, AFT: 13/min.  Cranial nerves II - XII are as described above under HEENT exam. In addition: shoulder shrug is normal with equal shoulder height noted. Motor exam: Normal bulk, strength and tone is noted. There is no tremor or rebound. Romberg isnot tested for safety reasons.Fine motor skills and coordination: grossly intact.  Cerebellar testing: No dysmetria or intention tremor. There is no truncal or gait ataxia.  Sensory exam: intact to light touch in the upper and lower extremities.  Gait, station and balance:Shestands without significant difficulty, no limp, walking is age-appropriate.  Assessmentand Plan:   In summary,Dhruvi S Turneris a very pleasant 74 year oldfemalewith an underlying complex medical history of diabetes, asthma, reflux disease, hypertension, hyperlipidemia, kidney cancer with status post left nephrectomy, sleep apnea, vertigo, chronic kidney disease, arthritis and obesity, who presents for follow-up consultation of her memory loss and her sleep apnea. On examination, she has stable mild memory loss and she is advised, that We will continue to  monitor her memory and she is encouraged to continue  with generic Aricept, she gets the prescription typically from her primary care physician. Her brain MRI showed fairly age-appropriate findings thankfully. Her sleep study testing revealed mild sleep apnea. She has been on treatment with CPAP therapy and is fully compliant. She is commended for her Ongoing excellent treatment adherence.  She is advised to follow-up routinely in 6 months to see the nurse practitioner. She is encouraged to call her DME company regarding advice on how to Change the nasal pillow insert.  I did my best to talk her through it as well. I answered all her questions today and she was in agreement. I spent 20 minutes in total face-to-face time with the patient, more than 50% of which was spent in counseling and coordination of care, reviewing test results, reviewing medication and discussing or reviewing the diagnosis of OSA, memory loss, its prognosis and treatment options. Pertinent laboratory and imaging test results that were available during this visit with the patient were reviewed by me and considered in my medical decision making (see chart for details).

## 2018-09-19 IMAGING — MR MR ABDOMEN W/O CM
4 of 8 series · 24 of 48 positions shown · non-contrast
Comparison: MRI on 01/30/2015

CLINICAL DATA: Intermittent sharp abdominal pain. Followup renal
cell carcinoma. Previous right nephrectomy and partial left
nephrectomy.

EXAM:
MRI ABDOMEN WITHOUT CONTRAST
TECHNIQUE: Multiplanar multisequence MR imaging was performed without the
administration of intravenous contrast.

[Series 3: DWI b500 · axial · 6.0mm · 1.48mm/px · z∈[-13,+229]mm · 7 of 64 slices shown]
[im 1/64]
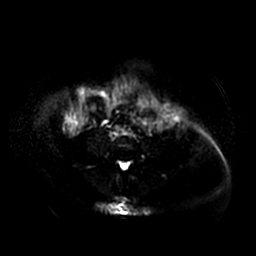
[im 11/64]
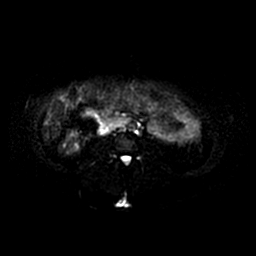
[im 22/64]
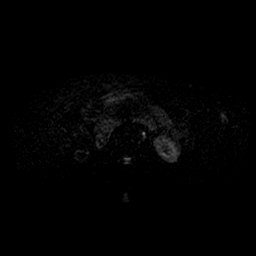
[im 32/64]
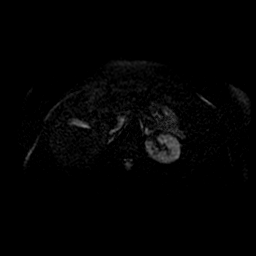
[im 43/64]
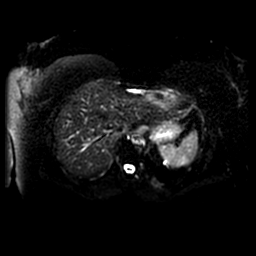
[im 53/64]
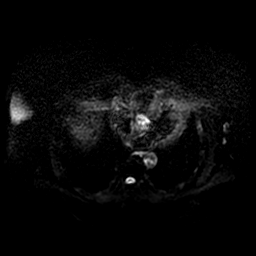
[im 64/64]
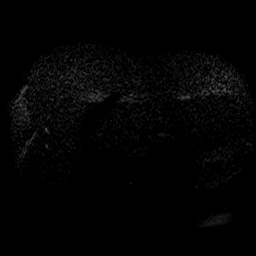

[Series 4: T2 fat-sat · axial · 5.0mm · 0.86mm/px · z∈[-38,+222]mm · 5 of 53 slices shown]
[im 1/53]
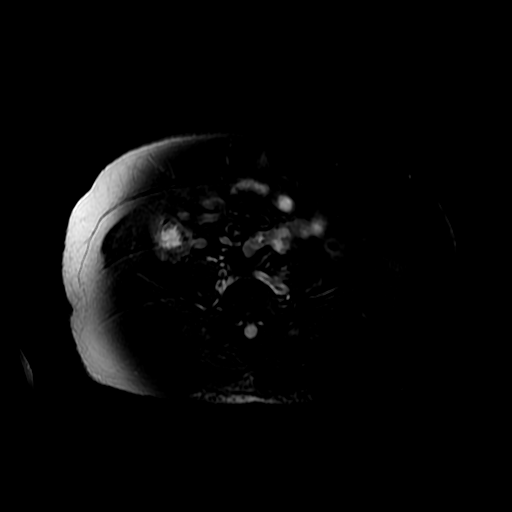
[im 14/53]
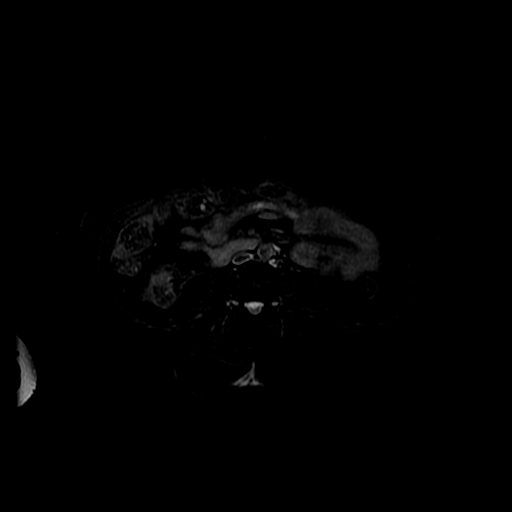
[im 27/53]
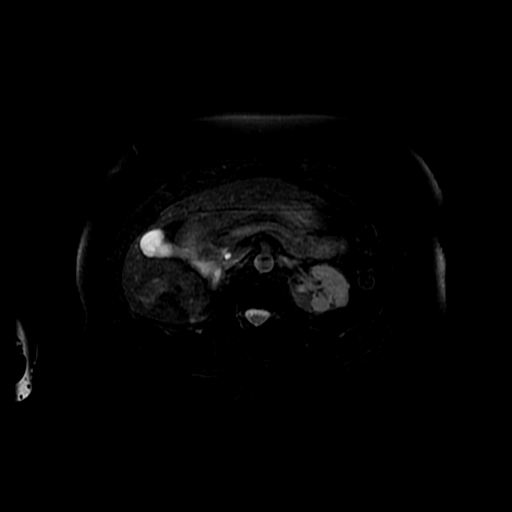
[im 40/53]
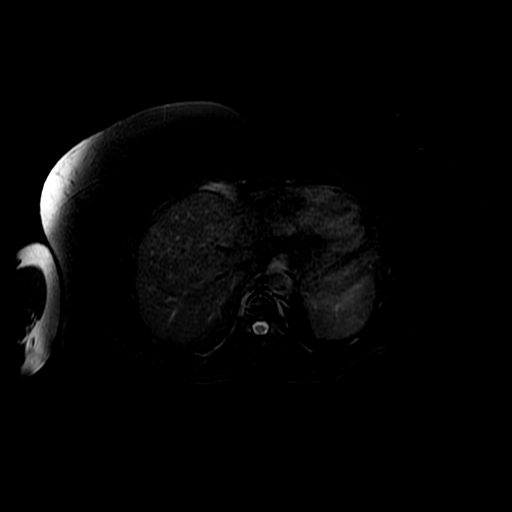
[im 53/53]
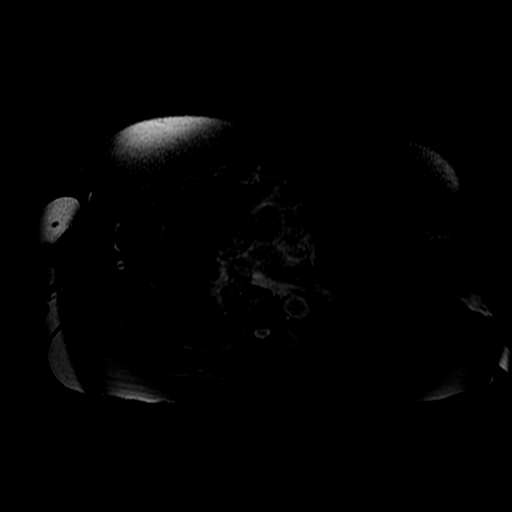

[Series 5: ax dualecho · axial · 5.0mm · 0.86mm/px · z∈[-38,+197]mm · 9 of 106 slices shown]
[im 1/106]
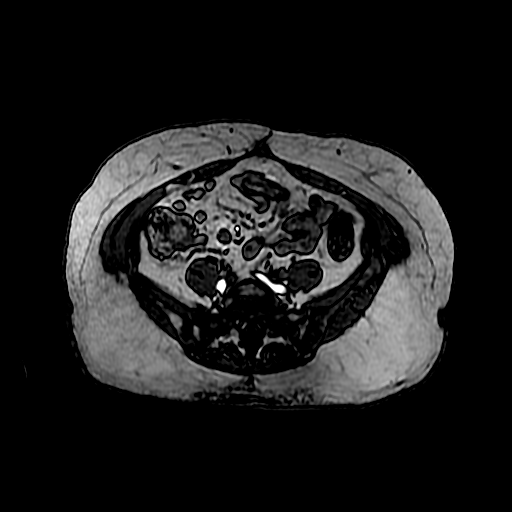
[im 11/106]
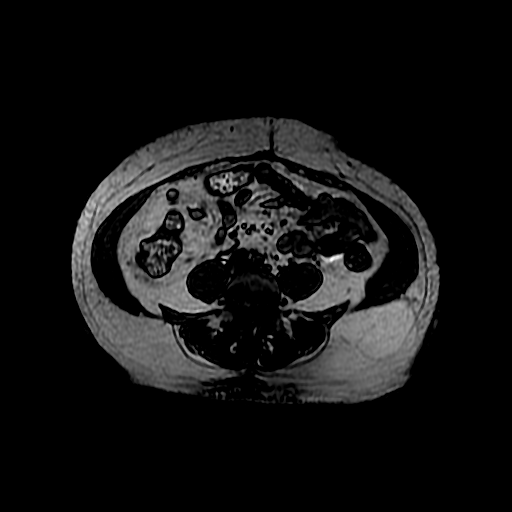
[im 22/106]
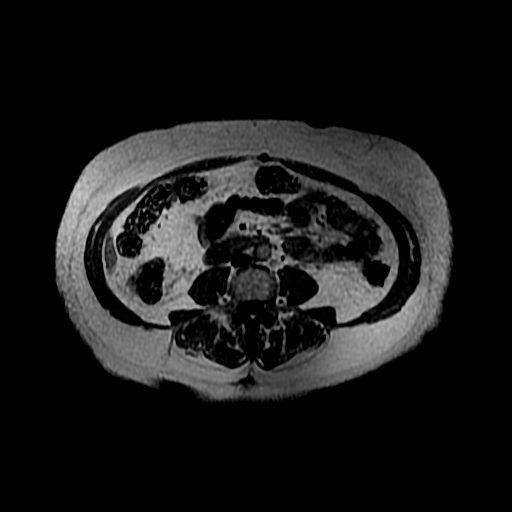
[im 32/106]
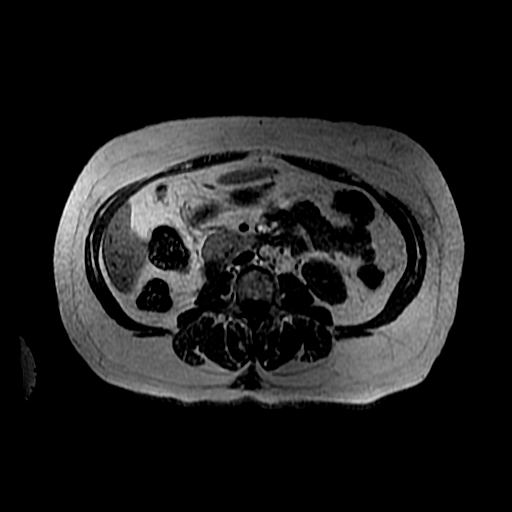
[im 43/106]
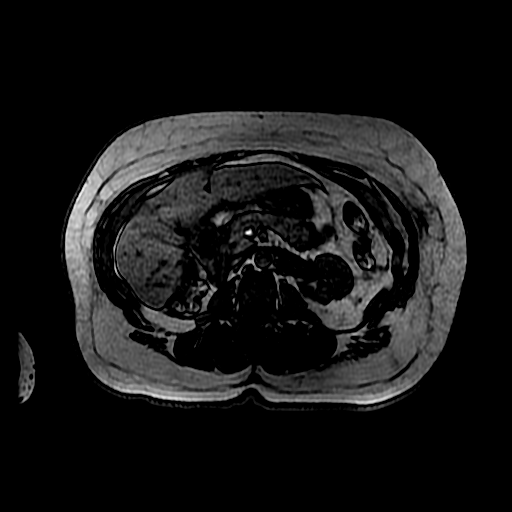
[im 53/106]
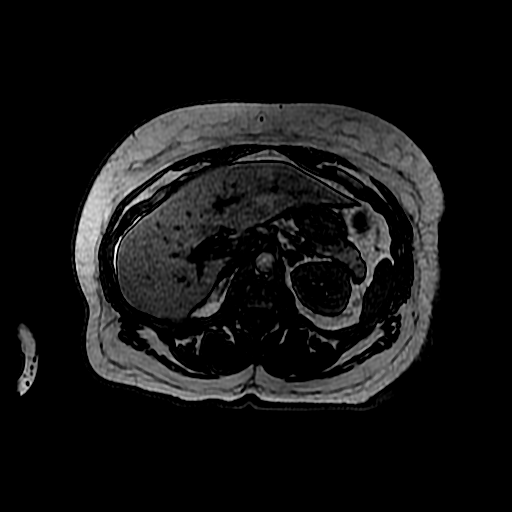
[im 64/106]
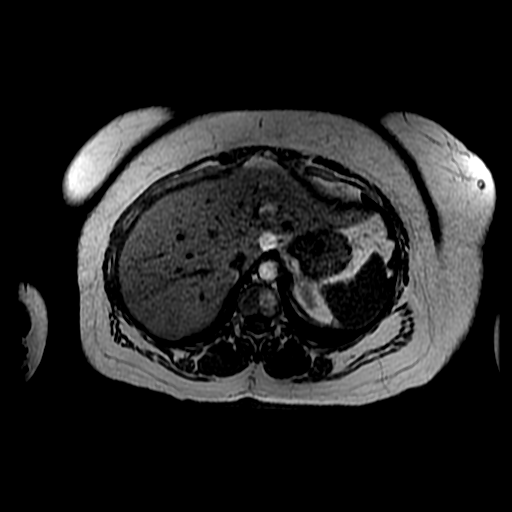
[im 74/106]
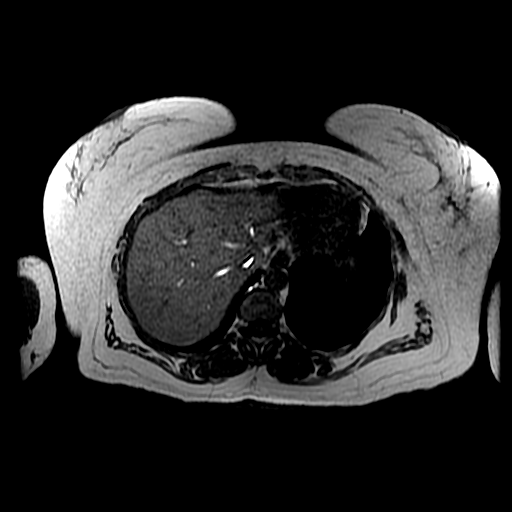
[im 95/106]
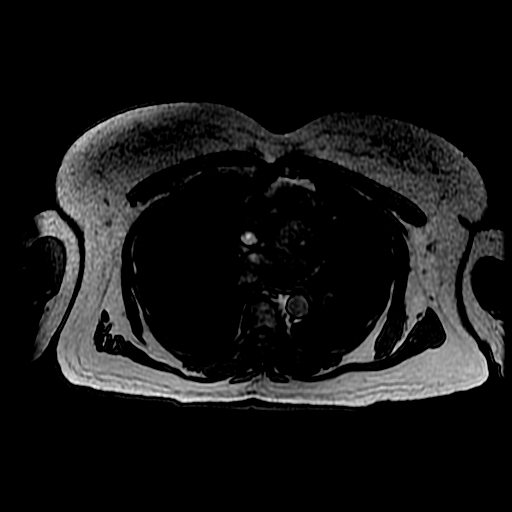

[Series 6: T2 · axial · 5.0mm · 0.86mm/px · z∈[-33,+222]mm · 3 of 52 slices shown]
[im 1/52]
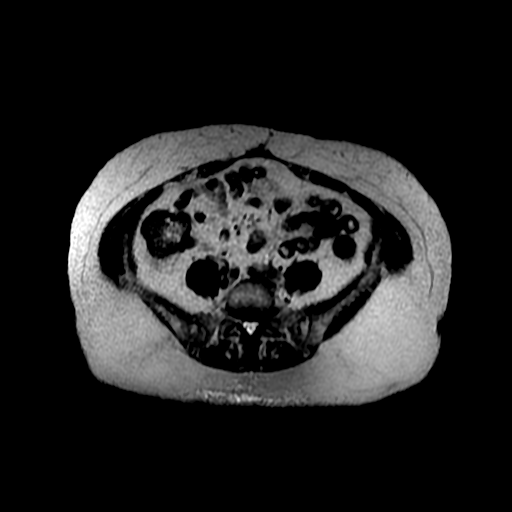
[im 26/52]
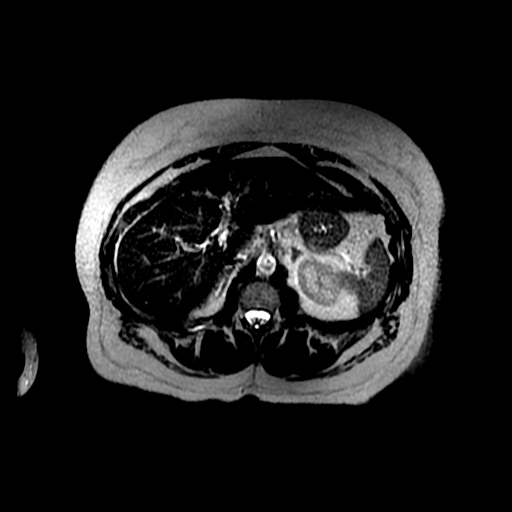
[im 52/52]
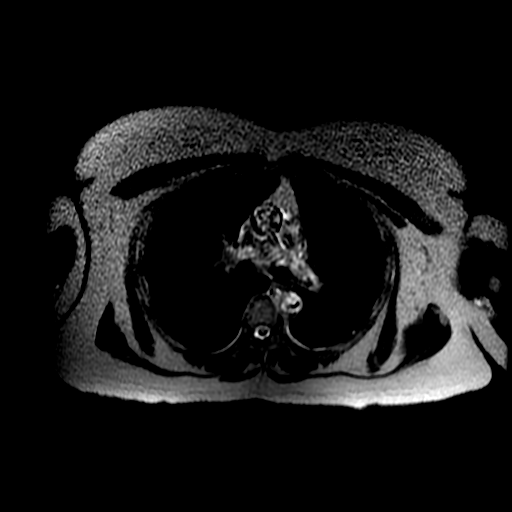

[24 of 48 positions shown; findings below may reference images not displayed]

FINDINGS: Lower chest: No acute findings.

Hepatobiliary: No mass visualized on this unenhanced exam.
Gallbladder is unremarkable. No evidence of biliary ductal
dilatation.

Pancreas: No mass or inflammatory process visualized on this
unenhanced exam.

Spleen:  Within normal limits in size.

Adrenals/Urinary tract: Normal appearance of both adrenal glands.
Interval right nephrectomy. No mass identified within the right
nephrectomy bed. Stable post surgical changes from previous partial
left nephrectomy. No left renal mass identified on this unenhanced
exam. No evidence of hydronephrosis.

Stomach/Bowel: No evidence of obstruction, inflammatory process, or
abnormal fluid collections.

Vascular/Lymphatic: No pathologically enlarged lymph nodes
identified. No evidence of abdominal aortic aneurysm.

Other:  None.

Musculoskeletal:  No suspicious bone lesions identified.
IMPRESSION: Interval right nephrectomy. Stable postsurgical changes from partial
left nephrectomy.

No acute findings. No evidence of recurrent or metastatic carcinoma
within the abdomen.

## 2018-11-17 ENCOUNTER — Other Ambulatory Visit: Payer: Self-pay

## 2018-11-17 ENCOUNTER — Encounter (INDEPENDENT_AMBULATORY_CARE_PROVIDER_SITE_OTHER): Payer: Medicare Other | Admitting: Ophthalmology

## 2018-11-17 DIAGNOSIS — H35033 Hypertensive retinopathy, bilateral: Secondary | ICD-10-CM | POA: Diagnosis not present

## 2018-11-17 DIAGNOSIS — E103393 Type 1 diabetes mellitus with moderate nonproliferative diabetic retinopathy without macular edema, bilateral: Secondary | ICD-10-CM

## 2018-11-17 DIAGNOSIS — I10 Essential (primary) hypertension: Secondary | ICD-10-CM | POA: Diagnosis not present

## 2018-11-17 DIAGNOSIS — H43813 Vitreous degeneration, bilateral: Secondary | ICD-10-CM

## 2018-11-17 DIAGNOSIS — E10319 Type 1 diabetes mellitus with unspecified diabetic retinopathy without macular edema: Secondary | ICD-10-CM | POA: Diagnosis not present

## 2018-12-22 ENCOUNTER — Other Ambulatory Visit (HOSPITAL_COMMUNITY): Payer: Self-pay | Admitting: Urology

## 2018-12-22 ENCOUNTER — Other Ambulatory Visit: Payer: Self-pay | Admitting: Urology

## 2018-12-22 DIAGNOSIS — Z85528 Personal history of other malignant neoplasm of kidney: Secondary | ICD-10-CM

## 2018-12-28 ENCOUNTER — Other Ambulatory Visit: Payer: Self-pay | Admitting: Cardiology

## 2018-12-28 DIAGNOSIS — I10 Essential (primary) hypertension: Secondary | ICD-10-CM

## 2019-01-06 ENCOUNTER — Ambulatory Visit: Payer: Medicare Other | Admitting: Podiatry

## 2019-01-20 ENCOUNTER — Ambulatory Visit (HOSPITAL_COMMUNITY)
Admission: RE | Admit: 2019-01-20 | Discharge: 2019-01-20 | Disposition: A | Discharge status: Home / Self Care | Payer: Medicare Other | Source: Ambulatory Visit | Attending: Urology | Admitting: Urology

## 2019-01-20 ENCOUNTER — Other Ambulatory Visit: Payer: Self-pay

## 2019-01-20 DIAGNOSIS — Z85528 Personal history of other malignant neoplasm of kidney: Secondary | ICD-10-CM | POA: Diagnosis present

## 2019-01-25 IMAGING — MG DIGITAL SCREENING BILATERAL MAMMOGRAM WITH CAD
6 series · 6 of 6 positions shown · non-contrast
Comparison: Previous exam(s).

CLINICAL DATA: Screening.

EXAM:
DIGITAL SCREENING BILATERAL MAMMOGRAM WITH CAD

[R MLO (1 of 2)]
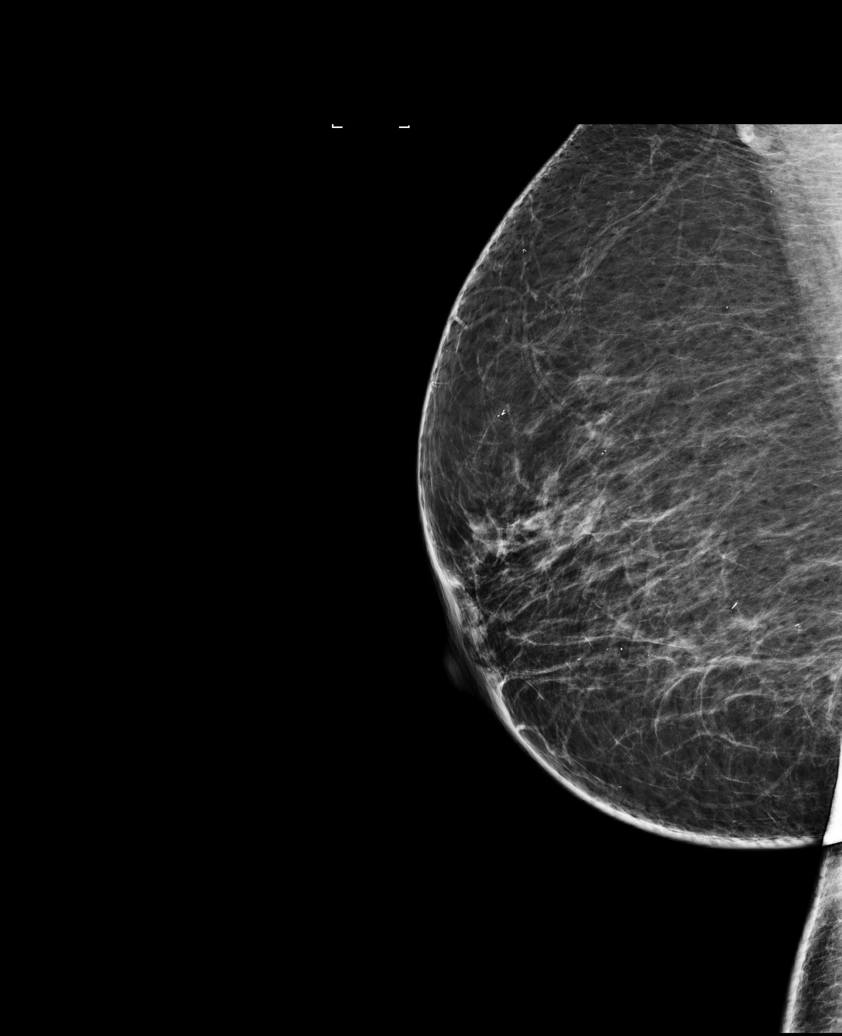

[L MLO (1 of 2)]
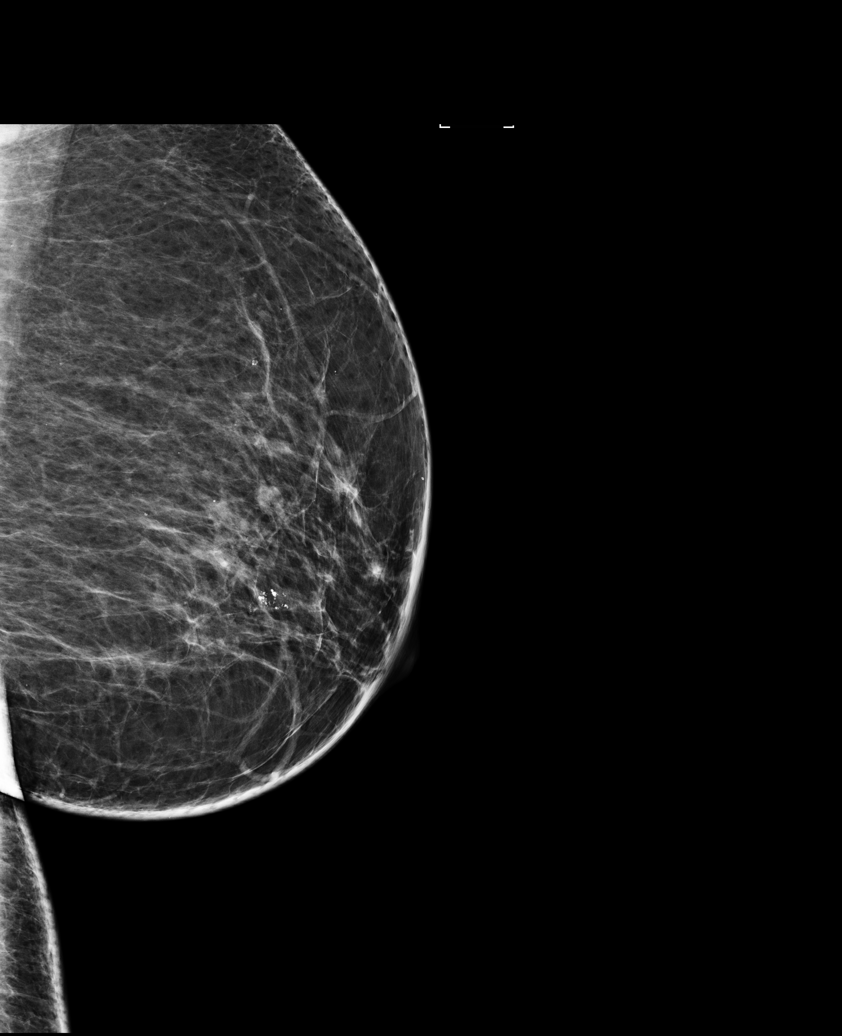

[L MLO (2 of 2)]
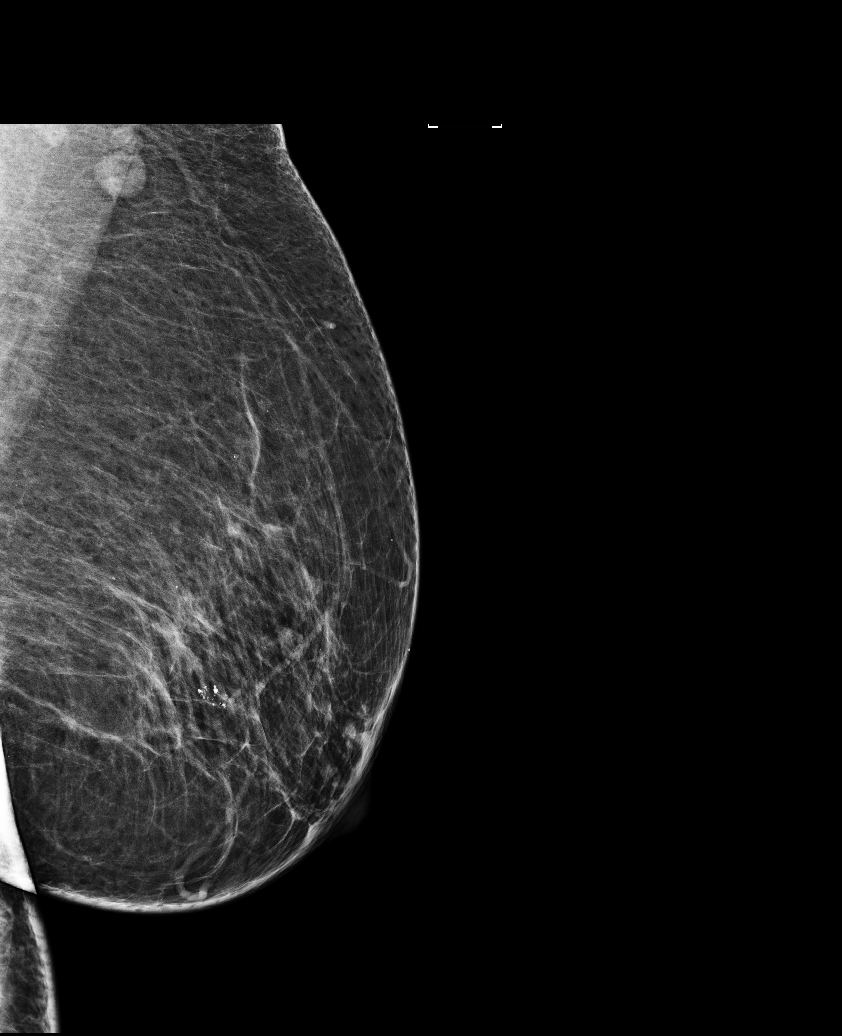

[R MLO (2 of 2)]
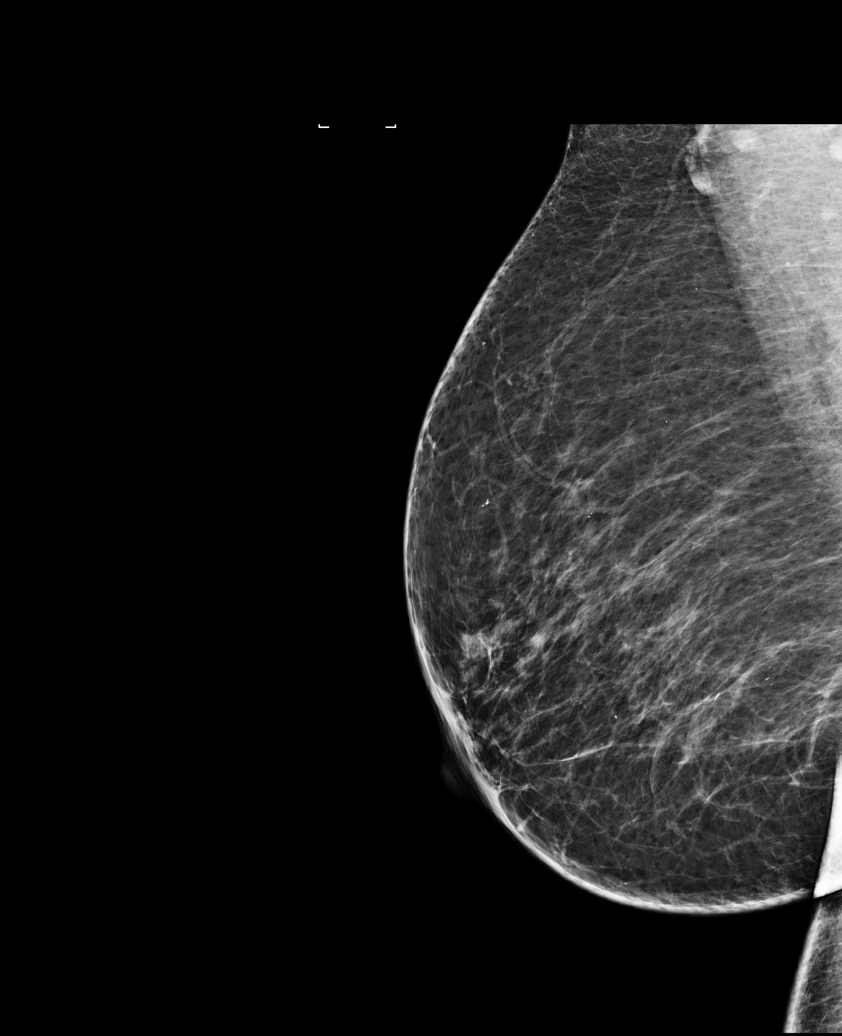

[L CC]
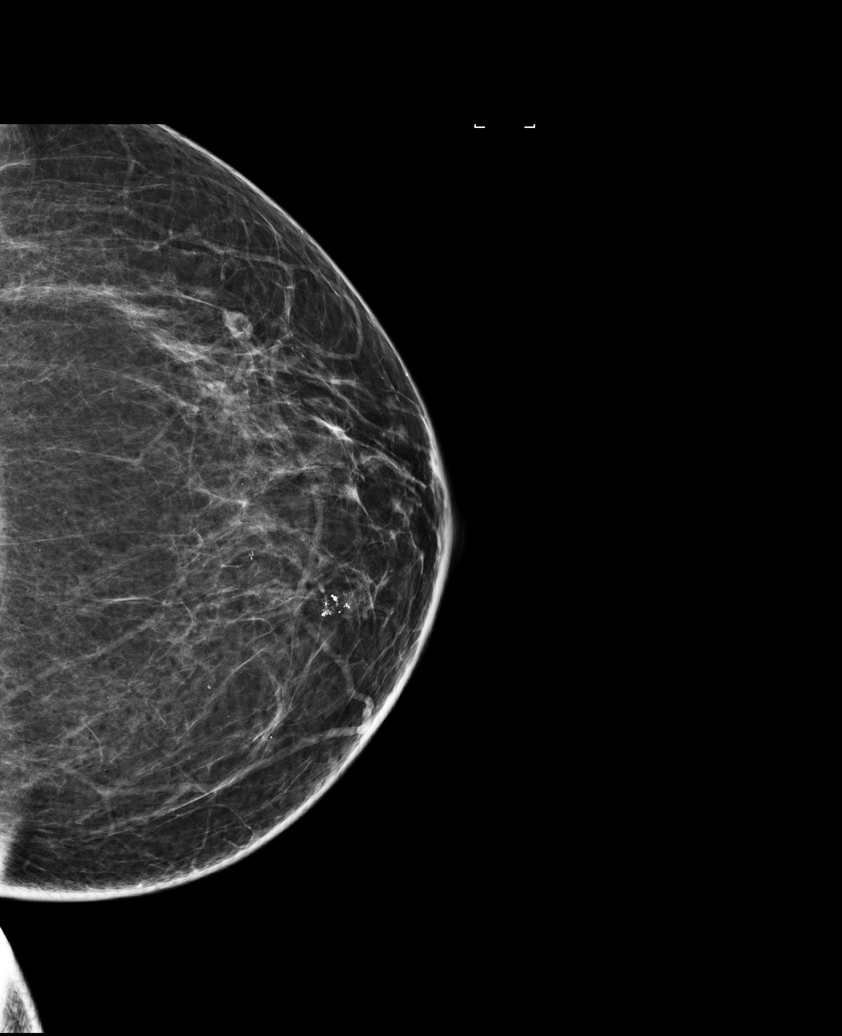

[R CC]
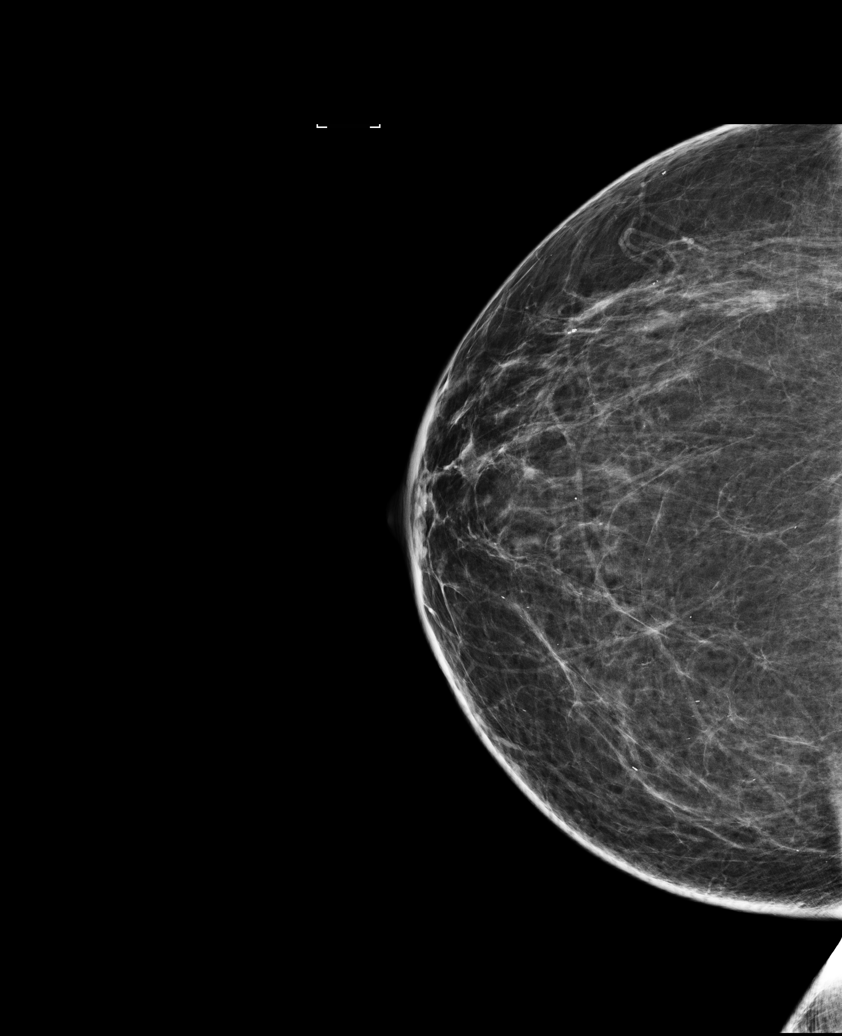

[6 of 6 positions shown; findings below may reference images not displayed]

ACR Breast Density Category b: There are scattered areas of
fibroglandular density.
FINDINGS: There are no findings suspicious for malignancy. Images were
processed with CAD.
IMPRESSION: No mammographic evidence of malignancy. A result letter of this
screening mammogram will be mailed directly to the patient.

RECOMMENDATION:
Screening mammogram in one year. (Code:AS-G-LCT)

BI-RADS CATEGORY  1: Negative.

## 2019-01-27 ENCOUNTER — Ambulatory Visit (HOSPITAL_COMMUNITY)
Admission: RE | Admit: 2019-01-27 | Discharge: 2019-01-27 | Disposition: A | Discharge status: Home / Self Care | Payer: Medicare Other | Source: Ambulatory Visit | Attending: Urology | Admitting: Urology

## 2019-01-27 ENCOUNTER — Other Ambulatory Visit (HOSPITAL_COMMUNITY): Payer: Self-pay | Admitting: Urology

## 2019-01-27 ENCOUNTER — Other Ambulatory Visit: Payer: Self-pay

## 2019-01-27 DIAGNOSIS — Z85528 Personal history of other malignant neoplasm of kidney: Secondary | ICD-10-CM

## 2019-03-08 DIAGNOSIS — I10 Essential (primary) hypertension: Secondary | ICD-10-CM | POA: Diagnosis not present

## 2019-03-08 DIAGNOSIS — E1169 Type 2 diabetes mellitus with other specified complication: Secondary | ICD-10-CM | POA: Diagnosis not present

## 2019-03-08 DIAGNOSIS — N184 Chronic kidney disease, stage 4 (severe): Secondary | ICD-10-CM | POA: Diagnosis not present

## 2019-03-08 DIAGNOSIS — Z794 Long term (current) use of insulin: Secondary | ICD-10-CM | POA: Diagnosis not present

## 2019-03-10 DIAGNOSIS — Z01812 Encounter for preprocedural laboratory examination: Secondary | ICD-10-CM | POA: Diagnosis not present

## 2019-03-10 DIAGNOSIS — H02403 Unspecified ptosis of bilateral eyelids: Secondary | ICD-10-CM | POA: Diagnosis not present

## 2019-03-15 ENCOUNTER — Encounter: Payer: Self-pay | Admitting: Neurology

## 2019-03-15 IMAGING — DX DG CHEST 2V
2 series · 2 of 2 positions shown · non-contrast
Comparison: 02/01/2016

CLINICAL DATA: Recent left side chest pain radiating to left
shoulder

EXAM:
CHEST  2 VIEW

[chest pa]
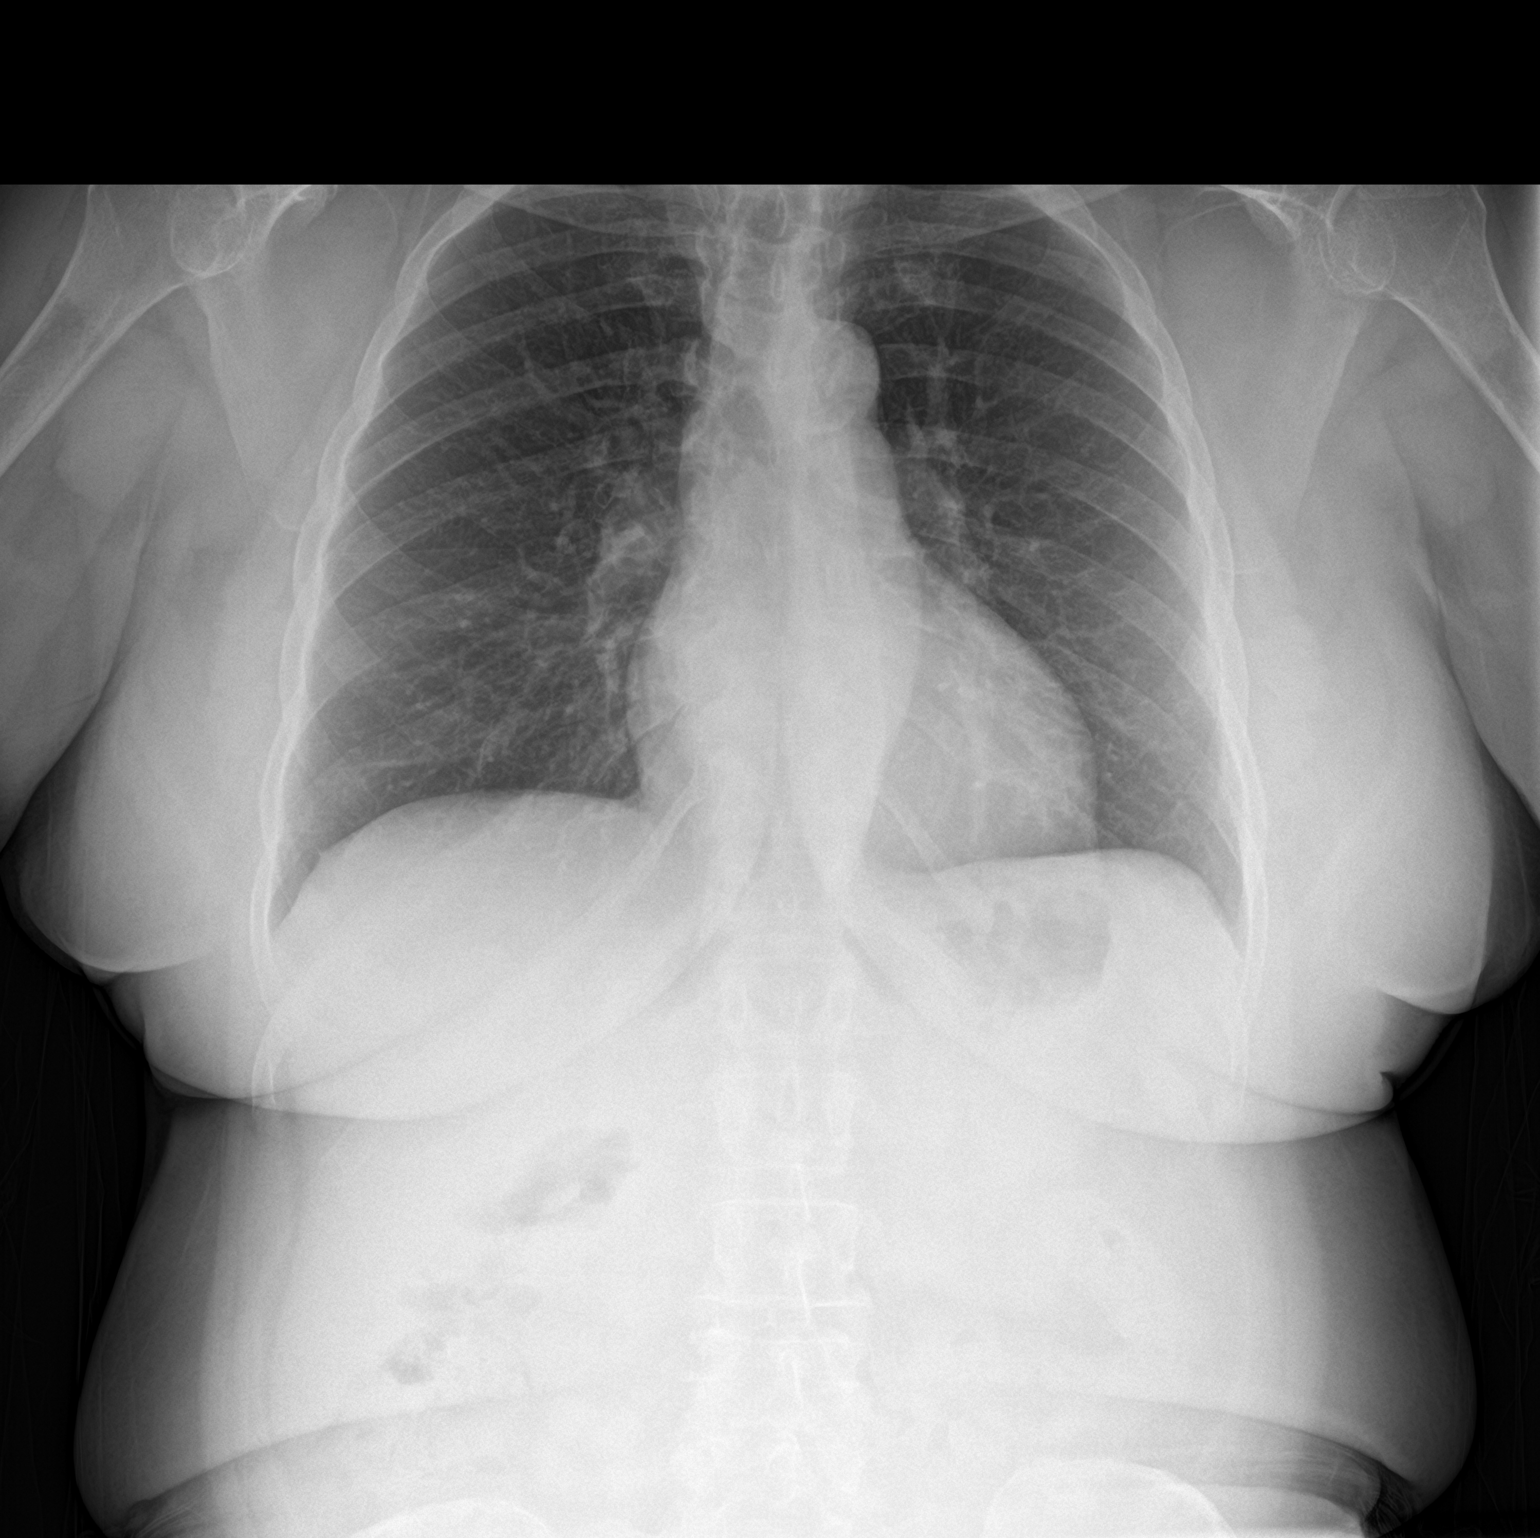

[chest lat]
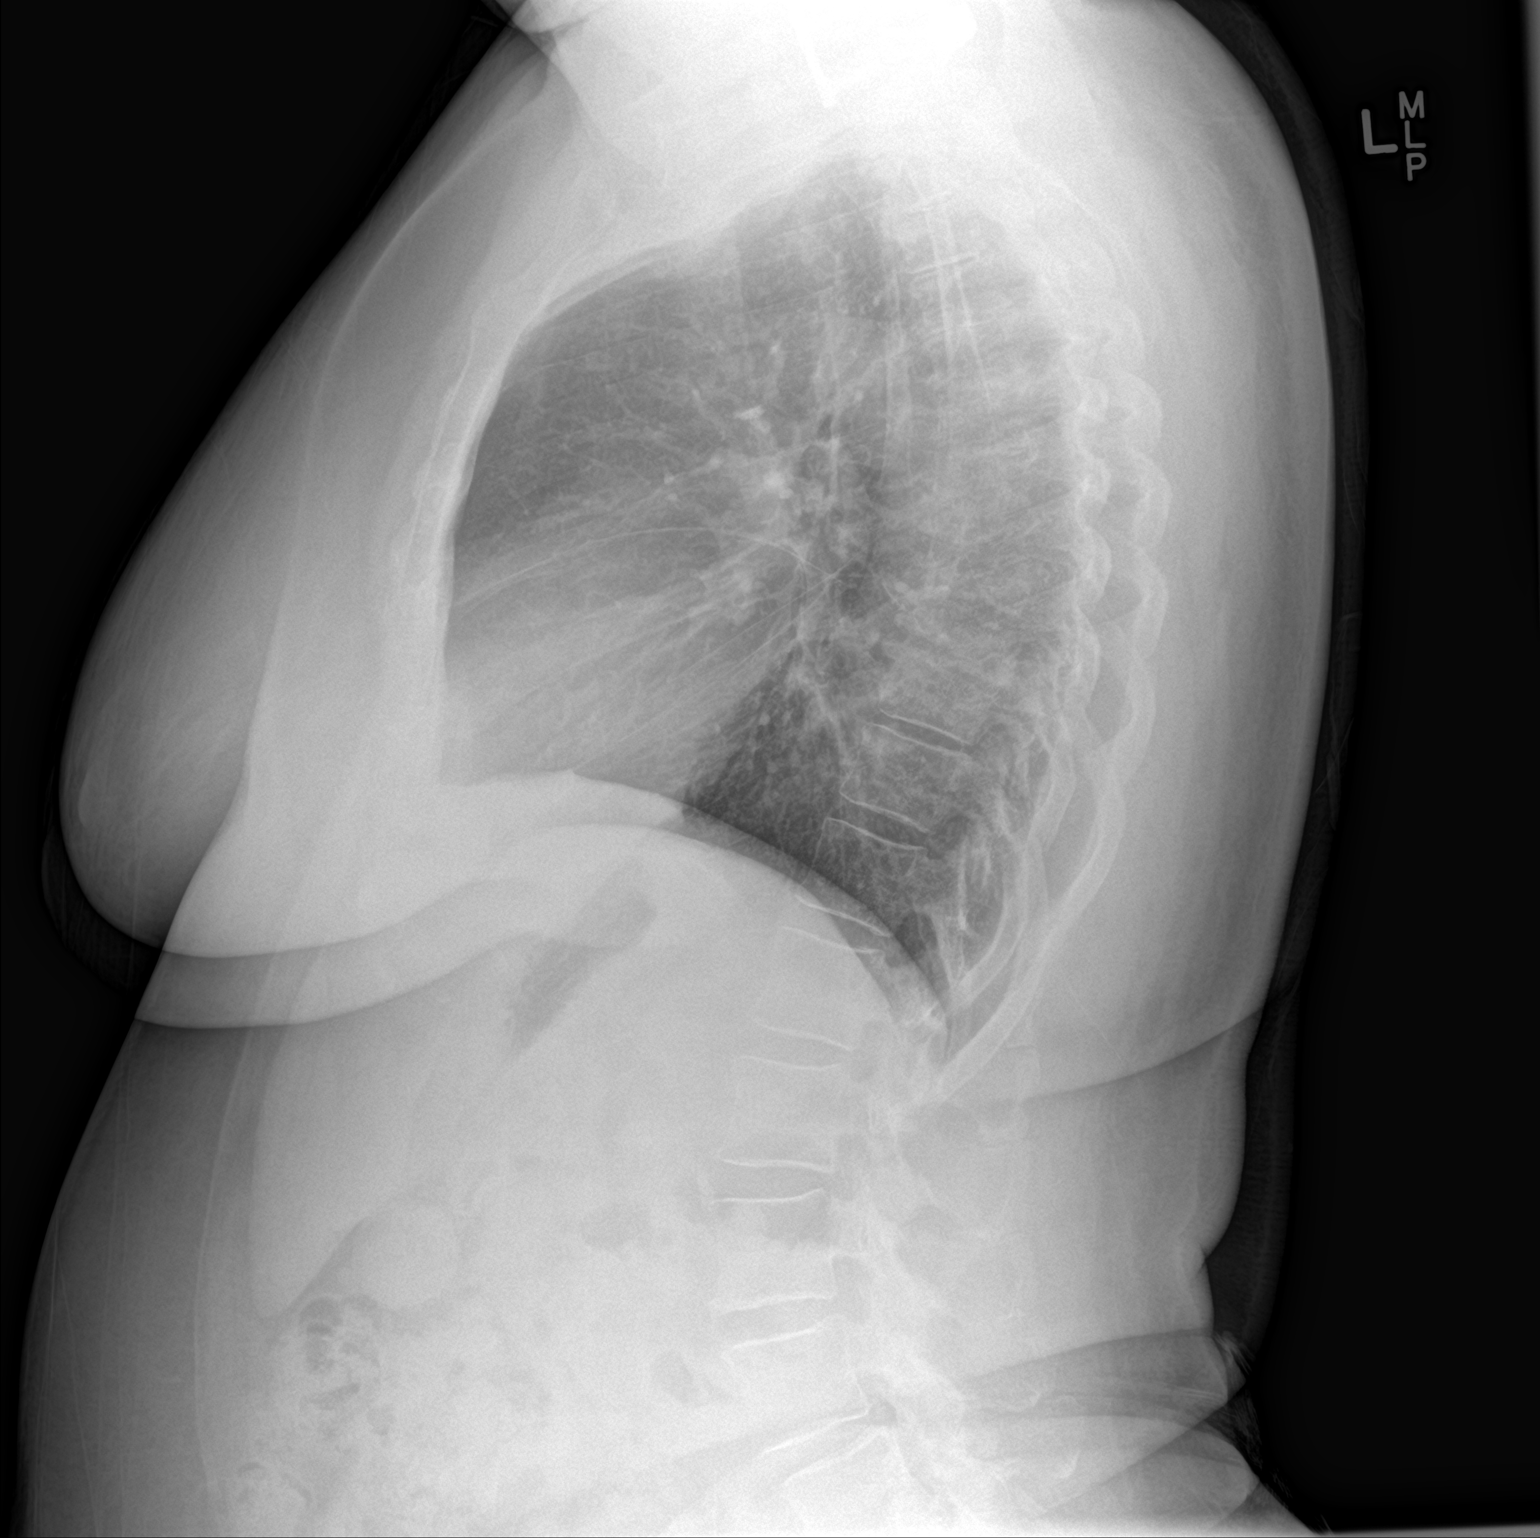

[2 of 2 positions shown; findings below may reference images not displayed]

FINDINGS: Heart and mediastinal contours are within normal limits. No focal
opacities or effusions. No acute bony abnormality.
IMPRESSION: No active cardiopulmonary disease.

## 2019-03-17 ENCOUNTER — Encounter: Payer: Self-pay | Admitting: Adult Health

## 2019-03-17 ENCOUNTER — Other Ambulatory Visit: Payer: Self-pay

## 2019-03-17 ENCOUNTER — Ambulatory Visit: Payer: Medicare PPO | Admitting: Adult Health

## 2019-03-17 VITALS — BP 132/74 | HR 62 | Temp 97.6°F | Ht 59.0 in | Wt 155.2 lb

## 2019-03-17 DIAGNOSIS — G4733 Obstructive sleep apnea (adult) (pediatric): Secondary | ICD-10-CM | POA: Diagnosis not present

## 2019-03-17 DIAGNOSIS — R413 Other amnesia: Secondary | ICD-10-CM | POA: Diagnosis not present

## 2019-03-17 DIAGNOSIS — Z9989 Dependence on other enabling machines and devices: Secondary | ICD-10-CM | POA: Diagnosis not present

## 2019-03-17 NOTE — Progress Notes (Addendum)
PATIENT: [name] DOB: 1940/02/19  REASON FOR VISIT: follow up HISTORY FROM: patient  HISTORY OF PRESENT ILLNESS: Today 03/17/19:  [name] is an 79 year old female with a history of obstructive sleep apnea on CPAP.  She returns today for follow-up.  Her download indicates that she use her machine nightly for compliance of 100%.  She use her machine greater than 4 hours each night.  On average she uses her machine 8 hours and 18 minutes.  Her residual AHI is 2.7 on 9 cm of water with EPR of 3.  Her leak in the 95th percentile is 6.2 L/min.  Overall she feels that she is doing well with CPAP.  She continues to notice some changes in her memory.  She reports that she does not feel as sharp as she used to be.  She lives at home alone.  She is able to complete all ADLs independently.  She manages her own finances.  She operates a Teacher, music.  She reports that she typically does well as long as she does not get distracted.  She remains on Aricept.  She returns today for an evaluation.    REVIEW OF SYSTEMS: Out of a complete 14 system review of symptoms, the patient complains only of the following symptoms, and all other reviewed systems are negative.  See HPI  ALLERGIES: Allergies  Allergen Reactions  . Sulfa Antibiotics Itching    HOME MEDICATIONS: Outpatient Medications Prior to Visit  Medication Sig Dispense Refill  . albuterol (PROVENTIL,VENTOLIN) 90 MCG/ACT inhaler Inhale 2 puffs into the lungs every 4 (four) hours as needed for wheezing or shortness of breath.     Marland Kitchen amLODipine (NORVASC) 5 MG tablet TAKE 1 TABLET BY MOUTH EVERY DAY 90 tablet 1  . aspirin EC 81 MG tablet Take 81 mg by mouth at bedtime.    Marland Kitchen atorvastatin (LIPITOR) 10 MG tablet Take 10 mg by mouth every morning.     . cholecalciferol (VITAMIN D) 1000 units tablet Take 1,000 Units by mouth daily.    Marland Kitchen donepezil (ARICEPT) 5 MG tablet Take 5 mg by mouth daily.  1  . gabapentin (NEURONTIN) 300 MG capsule Take  1 capsule (300 mg total) by mouth at bedtime. 90 capsule 0  . Insulin Degludec (TRESIBA FLEXTOUCH) 200 UNIT/ML SOPN Inject 76 Units into the skin daily before breakfast.     . mirabegron ER (MYRBETRIQ) 50 MG TB24 tablet Take 50 mg by mouth daily.    . Multiple Vitamins-Minerals (CENTRUM SILVER ADULT 50+ PO) Take 1 tablet by mouth daily.    . nitroGLYCERIN (NITROSTAT) 0.4 MG SL tablet Place 0.4 mg under the tongue every 5 (five) minutes as needed for chest pain.    . TRADJENTA 5 MG TABS tablet Take 5 mg by mouth daily.  3  . lidocaine (LIDODERM) 5 % Place 1 patch onto the skin daily. Remove & Discard patch within 12 hours or as directed by MD 30 patch 0  . metoprolol succinate (TOPROL-XL) 100 MG 24 hr tablet TAKE 1 TABLET BY MOUTH EVERY DAY 90 tablet 1  . HYDROcodone-acetaminophen (NORCO/VICODIN) 5-325 MG tablet Take 1 tablet by mouth every 6 (six) hours as needed for up to 15 doses for severe pain. 15 tablet 0   No facility-administered medications prior to visit.    PAST MEDICAL HISTORY: Past Medical History:  Diagnosis Date  . ACS (acute coronary syndrome) ([place]) 12/27/2010  . AKI (acute kidney injury) ([place]) 03/25/2018  . Anginal pain (  [place])   . Arthritis   . Asthma   . Diabetes mellitus    Type 1  . Dysrhythmia    "irregular heart beat"  . GERD (gastroesophageal reflux disease)   . History of urinary tract infection   . Hypercholesteremia   . Hypertension   . left renal ca dx'd 2012 (?)   surg only, left kidney  . Nocturia   . Reflux   . Renal failure (ARF), acute on chronic (HCC)   . Shortness of breath dyspnea    pt denies; states can climb stairs w/o difficulty   . Sleep apnea    does not use c-pap machine  . Tingling in extremities    legs bilat  . Tinnitus   . Urinary frequency   . Urinary incontinence   . Vertigo    occurs when lying flat     PAST SURGICAL HISTORY: Past Surgical History:  Procedure Laterality Date  . ABDOMINAL HYSTERECTOMY    . APPENDECTOMY      . CARDIAC CATHETERIZATION N/A 07/28/2014   Procedure: Left Heart Cath and Coronary Angiography;  Surgeon: [name], MD;  Location: [facility];  Service: Cardiovascular;  Laterality: N/A;  . CYSTOSCOPY WITH RETROGRADE PYELOGRAM, URETEROSCOPY AND STENT PLACEMENT Right 02/09/2015   Procedure: CYSTOSCOPY WITH RETROGRADE PYELOGRAM AND URETEROSCOPY ;  Surgeon: Raynelle Bring, MD;  Location: [facility];  Service: Urology;  Laterality: Right;  . LAPAROSCOPIC NEPHRECTOMY Right 03/20/2015   Procedure: LAPAROSCOPIC NEPHRECTOMY;  Surgeon: Raynelle Bring, MD;  Location: [facility];  Service: Urology;  Laterality: Right;  . PERIPHERAL VASCULAR CATHETERIZATION N/A 07/28/2014   Procedure: Aortic Arch Angiography;  Surgeon: [name], MD;  Location: [facility];  Service: Cardiovascular;  Laterality: N/A;  . RENAL MASS EXCISION    . SPINE SURGERY      FAMILY HISTORY: Family History  Problem Relation Age of Onset  . Hypertension Mother   . Coronary artery disease Other   . Breast cancer Cousin     SOCIAL HISTORY: Social History   Socioeconomic History  . Marital status: Widowed    Spouse name: Not on file  . Number of children: 2  . Years of education: Not on file  . Highest education level: Not on file  Occupational History  . Not on file  Tobacco Use  . Smoking status: Never Smoker  . Smokeless tobacco: Never Used  Substance and Sexual Activity  . Alcohol use: No  . Drug use: No    Frequency: 1.0 times per week  . Sexual activity: Never  Other Topics Concern  . Not on file  Social History Narrative  . Not on file   Social Determinants of Health   Financial Resource Strain:   . Difficulty of Paying Living Expenses: Not on file  Food Insecurity:   . Worried About Charity fundraiser in the Last Year: Not on file  . Ran Out of Food in the Last Year: Not on file  Transportation Needs:   . Lack of Transportation (Medical): Not on file  . Lack of Transportation (Non-Medical): Not on  file  Physical Activity:   . Days of Exercise per Week: Not on file  . Minutes of Exercise per Session: Not on file  Stress:   . Feeling of Stress : Not on file  Social Connections:   . Frequency of Communication with Friends and Family: Not on file  . Frequency of Social Gatherings with Friends and Family: Not on file  .  Attends Religious Services: Not on file  . Active Member of Clubs or Organizations: Not on file  . Attends Archivist Meetings: Not on file  . Marital Status: Not on file  Intimate Partner Violence:   . Fear of Current or Ex-Partner: Not on file  . Emotionally Abused: Not on file  . Physically Abused: Not on file  . Sexually Abused: Not on file      PHYSICAL EXAM  Vitals:   03/17/19 1355  BP: 132/74  Pulse: 62  Temp: 97.6 F (36.4 C)  Weight: 155 lb 3.2 oz (70.4 kg)  Height: 4\' 11"  (1.499 m)   Body mass index is 31.35 kg/m.   MMSE - Mini Mental State Exam 03/17/2019 09/07/2018 03/05/2018  Orientation to time 5 5 5   Orientation to Place 5 4 4   Registration 3 3 3   Attention/ Calculation 1 5 5   Recall 3 3 3   Language- name 2 objects 2 2 2   Language- repeat 1 1 1   Language- follow 3 step command 3 3 3   Language- read & follow direction 1 1 1   Write a sentence 1 1 1   Copy design 1 0 1  Total score 26 28 29      Generalized: Well developed, in no acute distress   Neurological examination  Mentation: Alert oriented to time, place, history taking. Follows all commands speech and language fluent Cranial nerve II-XII:  Extraocular movements were full, visual field were full on confrontational test.. Head turning and shoulder shrug  were normal and symmetric. Motor: The motor testing reveals 5 over 5 strength of all 4 extremities. Good symmetric motor tone is noted throughout.  Sensory: Sensory testing is intact to soft touch on all 4 extremities. No evidence of extinction is noted.  Coordination: Cerebellar testing reveals good  finger-nose-finger and heel-to-shin bilaterally.  Gait and station: Gait is normal.  Reflexes: Deep tendon reflexes are symmetric and normal bilaterally.   DIAGNOSTIC DATA (LABS, IMAGING, TESTING) - I reviewed patient records, labs, notes, testing and imaging myself where available.  Lab Results  Component Value Date   WBC 5.3 03/26/2018   HGB 11.4 (L) 03/26/2018   HCT 36.3 03/26/2018   MCV 94.8 03/26/2018   PLT 190 03/26/2018      Component Value Date/Time   NA 142 03/26/2018 0340   K 3.9 03/26/2018 0340   CL 115 (H) 03/26/2018 0340   CO2 22 03/26/2018 0340   GLUCOSE 83 03/26/2018 0340   BUN 28 (H) 03/26/2018 0340   CREATININE 1.83 (H) 03/26/2018 0340   CALCIUM 8.9 03/26/2018 0340   PROT 6.6 03/26/2018 0340   ALBUMIN 3.3 (L) 03/26/2018 0340   AST 14 (L) 03/26/2018 0340   ALT 17 03/26/2018 0340   ALKPHOS 60 03/26/2018 0340   BILITOT 0.2 (L) 03/26/2018 0340   GFRNONAA 26 (L) 03/26/2018 0340   GFRAA 30 (L) 03/26/2018 0340   Lab Results  Component Value Date   CHOL 137 12/28/2010   HDL 43 12/28/2010   LDLCALC 75 12/28/2010   TRIG 97 12/28/2010   CHOLHDL 3.2 12/28/2010   Lab Results  Component Value Date   HGBA1C 10.2 (H) 02/06/2015   No results found for: [place] PLAN 79 y.o. year old female  has a past medical history of ACS (acute coronary syndrome) ([place]) (12/27/2010), AKI (acute kidney injury) ([place]) (03/25/2018), Anginal pain ([place]), Arthritis, Asthma, Diabetes mellitus, Dysrhythmia, GERD (gastroesophageal reflux disease), History of urinary tract infection, Hypercholesteremia,  Hypertension, left renal ca (dx'd 2012 (?)), Nocturia, Reflux, Renal failure (ARF), acute on chronic (HCC), Shortness of breath dyspnea, Sleep apnea, Tingling in extremities, Tinnitus, Urinary frequency, Urinary incontinence, and Vertigo. here with:  1.  Obstructive sleep apnea on CPAP  -CPAP download shows excellent appliance and good treatment of her apnea -Continue  using CPAP nightly  2.  Memory disturbance  -MMSE 26 out of 30 previously 28 out of 30 -Continue Aricept  Advised if symptoms worsen or she develops new symptoms she should let us know.  Follow-up in 6 months or sooner if needed.     Ward Givens, MSN, NP-C 03/17/2019, 2:16 PM [facility] [address] [place], [place] [address] [phone]  I reviewed the above note and documentation by the Nurse Practitioner and agree with the history, exam, assessment and plan as outlined above. I was available for consultation. Star Age, MD, PhD [facility])

## 2019-03-17 NOTE — Patient Instructions (Signed)
Your Plan:  Continue Aricept  Continue to monitor memory Continue CPAP If your symptoms worsen or you develop new symptoms please let us know.    Thank you for coming to see [place] at [facility]. I hope we have been able to provide you high quality care today.  You may receive a patient satisfaction survey over the next few weeks. We would appreciate your feedback and comments so that we may continue to improve ourselves and the health of our patients.

## 2019-03-23 DIAGNOSIS — Z20822 Contact with and (suspected) exposure to covid-19: Secondary | ICD-10-CM | POA: Diagnosis not present

## 2019-03-23 DIAGNOSIS — Z01812 Encounter for preprocedural laboratory examination: Secondary | ICD-10-CM | POA: Diagnosis not present

## 2019-03-29 DIAGNOSIS — H02403 Unspecified ptosis of bilateral eyelids: Secondary | ICD-10-CM | POA: Diagnosis not present

## 2019-05-11 DIAGNOSIS — M4800 Spinal stenosis, site unspecified: Secondary | ICD-10-CM | POA: Diagnosis not present

## 2019-05-11 DIAGNOSIS — I129 Hypertensive chronic kidney disease with stage 1 through stage 4 chronic kidney disease, or unspecified chronic kidney disease: Secondary | ICD-10-CM | POA: Diagnosis not present

## 2019-05-11 DIAGNOSIS — E119 Type 2 diabetes mellitus without complications: Secondary | ICD-10-CM | POA: Diagnosis not present

## 2019-05-11 DIAGNOSIS — E1122 Type 2 diabetes mellitus with diabetic chronic kidney disease: Secondary | ICD-10-CM | POA: Diagnosis not present

## 2019-05-11 DIAGNOSIS — N184 Chronic kidney disease, stage 4 (severe): Secondary | ICD-10-CM | POA: Diagnosis not present

## 2019-05-11 DIAGNOSIS — C642 Malignant neoplasm of left kidney, except renal pelvis: Secondary | ICD-10-CM | POA: Diagnosis not present

## 2019-05-11 DIAGNOSIS — N189 Chronic kidney disease, unspecified: Secondary | ICD-10-CM | POA: Diagnosis not present

## 2019-05-11 DIAGNOSIS — C641 Malignant neoplasm of right kidney, except renal pelvis: Secondary | ICD-10-CM | POA: Diagnosis not present

## 2019-05-11 DIAGNOSIS — N179 Acute kidney failure, unspecified: Secondary | ICD-10-CM | POA: Diagnosis not present

## 2019-05-11 DIAGNOSIS — N2581 Secondary hyperparathyroidism of renal origin: Secondary | ICD-10-CM | POA: Diagnosis not present

## 2019-05-11 DIAGNOSIS — D631 Anemia in chronic kidney disease: Secondary | ICD-10-CM | POA: Diagnosis not present

## 2019-05-18 DIAGNOSIS — G4733 Obstructive sleep apnea (adult) (pediatric): Secondary | ICD-10-CM | POA: Diagnosis not present

## 2019-05-24 ENCOUNTER — Encounter (INDEPENDENT_AMBULATORY_CARE_PROVIDER_SITE_OTHER): Payer: Medicare PPO | Admitting: Ophthalmology

## 2019-05-24 DIAGNOSIS — I10 Essential (primary) hypertension: Secondary | ICD-10-CM

## 2019-05-24 DIAGNOSIS — H35033 Hypertensive retinopathy, bilateral: Secondary | ICD-10-CM | POA: Diagnosis not present

## 2019-05-24 DIAGNOSIS — H43813 Vitreous degeneration, bilateral: Secondary | ICD-10-CM

## 2019-05-24 DIAGNOSIS — E113393 Type 2 diabetes mellitus with moderate nonproliferative diabetic retinopathy without macular edema, bilateral: Secondary | ICD-10-CM | POA: Diagnosis not present

## 2019-05-24 DIAGNOSIS — E11319 Type 2 diabetes mellitus with unspecified diabetic retinopathy without macular edema: Secondary | ICD-10-CM

## 2019-06-02 ENCOUNTER — Other Ambulatory Visit: Payer: Self-pay | Admitting: Family Medicine

## 2019-06-02 DIAGNOSIS — Z1231 Encounter for screening mammogram for malignant neoplasm of breast: Secondary | ICD-10-CM

## 2019-06-07 DIAGNOSIS — N184 Chronic kidney disease, stage 4 (severe): Secondary | ICD-10-CM | POA: Diagnosis not present

## 2019-06-07 DIAGNOSIS — E1122 Type 2 diabetes mellitus with diabetic chronic kidney disease: Secondary | ICD-10-CM | POA: Diagnosis not present

## 2019-06-07 DIAGNOSIS — Z794 Long term (current) use of insulin: Secondary | ICD-10-CM | POA: Diagnosis not present

## 2019-06-07 DIAGNOSIS — I10 Essential (primary) hypertension: Secondary | ICD-10-CM | POA: Diagnosis not present

## 2019-06-10 DIAGNOSIS — H02403 Unspecified ptosis of bilateral eyelids: Secondary | ICD-10-CM | POA: Diagnosis not present

## 2019-06-10 DIAGNOSIS — Z9889 Other specified postprocedural states: Secondary | ICD-10-CM | POA: Diagnosis not present

## 2019-06-11 ENCOUNTER — Ambulatory Visit
Admission: RE | Admit: 2019-06-11 | Discharge: 2019-06-11 | Disposition: A | Discharge status: Home / Self Care | Payer: Medicare Other | Source: Ambulatory Visit | Attending: Family Medicine | Admitting: Family Medicine

## 2019-06-11 ENCOUNTER — Other Ambulatory Visit: Payer: Self-pay

## 2019-06-11 DIAGNOSIS — Z1231 Encounter for screening mammogram for malignant neoplasm of breast: Secondary | ICD-10-CM

## 2019-06-22 ENCOUNTER — Emergency Department (HOSPITAL_COMMUNITY): Payer: Medicare PPO

## 2019-06-22 ENCOUNTER — Emergency Department (HOSPITAL_COMMUNITY)
Admission: EM | Admit: 2019-06-22 | Discharge: 2019-06-22 | Disposition: A | Discharge status: Home / Self Care | Payer: Medicare PPO | Attending: Emergency Medicine | Admitting: Emergency Medicine

## 2019-06-22 ENCOUNTER — Encounter (HOSPITAL_COMMUNITY): Payer: Self-pay | Admitting: Emergency Medicine

## 2019-06-22 ENCOUNTER — Other Ambulatory Visit: Payer: Self-pay

## 2019-06-22 DIAGNOSIS — E119 Type 2 diabetes mellitus without complications: Secondary | ICD-10-CM | POA: Diagnosis not present

## 2019-06-22 DIAGNOSIS — G44209 Tension-type headache, unspecified, not intractable: Secondary | ICD-10-CM | POA: Insufficient documentation

## 2019-06-22 DIAGNOSIS — I10 Essential (primary) hypertension: Secondary | ICD-10-CM | POA: Insufficient documentation

## 2019-06-22 DIAGNOSIS — Z85528 Personal history of other malignant neoplasm of kidney: Secondary | ICD-10-CM | POA: Insufficient documentation

## 2019-06-22 DIAGNOSIS — R072 Precordial pain: Secondary | ICD-10-CM

## 2019-06-22 DIAGNOSIS — R1013 Epigastric pain: Secondary | ICD-10-CM

## 2019-06-22 DIAGNOSIS — J45909 Unspecified asthma, uncomplicated: Secondary | ICD-10-CM | POA: Diagnosis not present

## 2019-06-22 DIAGNOSIS — R519 Headache, unspecified: Secondary | ICD-10-CM | POA: Diagnosis not present

## 2019-06-22 DIAGNOSIS — R079 Chest pain, unspecified: Secondary | ICD-10-CM | POA: Diagnosis not present

## 2019-06-22 DIAGNOSIS — R109 Unspecified abdominal pain: Secondary | ICD-10-CM | POA: Diagnosis not present

## 2019-06-22 LAB — CBC
HCT: 40.5 % (ref 36.0–46.0)
Hemoglobin: 12.9 g/dL (ref 12.0–15.0)
MCH: 30.1 pg (ref 26.0–34.0)
MCHC: 31.9 g/dL (ref 30.0–36.0)
MCV: 94.6 fL (ref 80.0–100.0)
Platelets: 212 10*3/uL (ref 150–400)
RBC: 4.28 MIL/uL (ref 3.87–5.11)
RDW: 14 % (ref 11.5–15.5)
WBC: 6.2 10*3/uL (ref 4.0–10.5)
nRBC: 0 % (ref 0.0–0.2)

## 2019-06-22 LAB — URINALYSIS, ROUTINE W REFLEX MICROSCOPIC
Bilirubin Urine: NEGATIVE
Glucose, UA: 150 mg/dL — AB
Hgb urine dipstick: NEGATIVE
Ketones, ur: NEGATIVE mg/dL
Leukocytes,Ua: NEGATIVE
Nitrite: NEGATIVE
Protein, ur: NEGATIVE mg/dL
Specific Gravity, Urine: 1.004 — ABNORMAL LOW (ref 1.005–1.030)
pH: 6 (ref 5.0–8.0)

## 2019-06-22 LAB — COMPREHENSIVE METABOLIC PANEL
ALT: 29 U/L (ref 0–44)
AST: 19 U/L (ref 15–41)
Albumin: 4.3 g/dL (ref 3.5–5.0)
Alkaline Phosphatase: 81 U/L (ref 38–126)
Anion gap: 7 (ref 5–15)
BUN: 28 mg/dL — ABNORMAL HIGH (ref 8–23)
CO2: 26 mmol/L (ref 22–32)
Calcium: 9.6 mg/dL (ref 8.9–10.3)
Chloride: 108 mmol/L (ref 98–111)
Creatinine, Ser: 2.15 mg/dL — ABNORMAL HIGH (ref 0.44–1.00)
GFR calc Af Amer: 24 mL/min — ABNORMAL LOW (ref >60)
GFR calc non Af Amer: 21 mL/min — ABNORMAL LOW (ref >60)
Glucose, Bld: 165 mg/dL — ABNORMAL HIGH (ref 70–99)
Potassium: 4.4 mmol/L (ref 3.5–5.1)
Sodium: 141 mmol/L (ref 135–145)
Total Bilirubin: 0.4 mg/dL (ref 0.3–1.2)
Total Protein: 8.1 g/dL (ref 6.5–8.1)

## 2019-06-22 LAB — TROPONIN I (HIGH SENSITIVITY)
Troponin I (High Sensitivity): 3 ng/L (ref <18)
Troponin I (High Sensitivity): 4 ng/L (ref <18)

## 2019-06-22 LAB — LIPASE, BLOOD: Lipase: 42 U/L (ref 11–51)

## 2019-06-22 MED ORDER — SODIUM CHLORIDE 0.9 % IV BOLUS
500.0000 mL | Freq: Once | INTRAVENOUS | Status: AC
  Administered 2019-06-22: 500 mL via INTRAVENOUS

## 2019-06-22 MED ORDER — SODIUM CHLORIDE 0.9% FLUSH: 3.0000 mL | Freq: Once | INTRAVENOUS | Status: DC

## 2019-06-22 NOTE — Discharge Instructions (Signed)
You were seen in the emergency department today with chest pain, abdominal pain, headache.  Your lab work here is reassuring.  You may have some mild dehydration and should call both your primary care doctor and your kidney doctors for repeat labs in the next week.  Please drink plenty of hydrating fluids.  If you develop worsening chest pain, abdominal pain, headache you should return to the emergency department immediately and or call 911.

## 2019-06-22 NOTE — ED Provider Notes (Signed)
Emergency Department Provider Note   I have reviewed the triage vital signs and the nursing notes.   HISTORY  Chief Complaint Headache, Dizziness, Abdominal Pain, Chest Pain, and Nausea   HPI [name] is a 79 y.o. female with past medical history of of asthma, diabetes, HLD, and remote history of renal cell carcinoma status post right nephrectomy and partial left nephrectomy presents to the emergency department with headache, lightheadedness, chest pain, abdominal pain.  Patient feels that symptoms began approximately 3 days ago primarily with lightheaded feeling.  She denies vertigo.  She developed a diffuse headache which is mostly in front.  She denies any sudden onset, maximal intensity headache symptoms.  She is not experiencing unilateral weakness or numbness.  She denies fevers or chills.  She is also describing a bandlike pain over the epigastric region with no clear modifying factors or provoking factors.  She denies any vomiting.  She is having some nonbloody diarrhea especially after eating.  Patient also describing some sharp center to right-sided chest discomfort.  Pain is not pleuritic or exertional.  She does not see a clear association with her abdominal pain.  She denies any dysuria, hesitancy, urgency. No vision change, speech change, or difficulty swallowing.   Past Medical History:  Diagnosis Date  . ACS (acute coronary syndrome) ([place]) 12/27/2010  . AKI (acute kidney injury) ([place]) 03/25/2018  . Anginal pain ([place])   . Arthritis   . Asthma   . Diabetes mellitus    Type 1  . Dysrhythmia    "irregular heart beat"  . GERD (gastroesophageal reflux disease)   . History of urinary tract infection   . Hypercholesteremia   . Hypertension   . left renal ca dx'd 2012 (?)   surg only, left kidney  . Nocturia   . Reflux   . Renal failure (ARF), acute on chronic (HCC)   . Shortness of breath dyspnea    pt denies; states can climb stairs w/o difficulty   . Sleep apnea     does not use c-pap machine  . Tingling in extremities    legs bilat  . Tinnitus   . Urinary frequency   . Urinary incontinence   . Vertigo    occurs when lying flat     Patient Active Problem List   Diagnosis Date Noted  . Pain due to onychomycosis of toenails of both feet 09/01/2018  . Left shoulder pain   . Limited joint range of motion (ROM)   . Chest pain 10/23/2015  . Diabetes mellitus type 2 in obese ([place]) 10/23/2015  . Chest pain with high risk for cardiac etiology 07/28/2014  . Diabetes mellitus 12/27/2010  . Hypertension 12/27/2010  . Hyperlipidemia 12/27/2010  . GERD (gastroesophageal reflux disease) 12/27/2010  . Renal cell adenocarcinoma ([place]) 12/27/2010  . Arthritis 12/27/2010  . Cervical stenosis of spine 12/27/2010  . Carpal tunnel syndrome 12/27/2010  . Asthma 12/27/2010    Past Surgical History:  Procedure Laterality Date  . ABDOMINAL HYSTERECTOMY    . APPENDECTOMY    . CARDIAC CATHETERIZATION N/A 07/28/2014   Procedure: Left Heart Cath and Coronary Angiography;  Surgeon: [name], MD;  Location: [facility];  Service: Cardiovascular;  Laterality: N/A;  . CYSTOSCOPY WITH RETROGRADE PYELOGRAM, URETEROSCOPY AND STENT PLACEMENT Right 02/09/2015   Procedure: CYSTOSCOPY WITH RETROGRADE PYELOGRAM AND URETEROSCOPY ;  Surgeon: Raynelle Bring, MD;  Location[facility];  Service: Urology;  Laterality: Right;  . LAPAROSCOPIC NEPHRECTOMY Right 03/20/2015  Procedure: LAPAROSCOPIC NEPHRECTOMY;  Surgeon: Raynelle Bring, MD;  Location: [facility];  Service: Urology;  Laterality: Right;  . PERIPHERAL VASCULAR CATHETERIZATION N/A 07/28/2014   Procedure: Aortic Arch Angiography;  Surgeon: [name], MD;  Location: [facility];  Service: Cardiovascular;  Laterality: N/A;  . RENAL MASS EXCISION    . SPINE SURGERY      Allergies Sulfa antibiotics  Family History  Problem Relation Age of Onset  . Hypertension Mother   . Coronary artery disease Other   . Breast  cancer Cousin     Social History Social History   Tobacco Use  . Smoking status: Never Smoker  . Smokeless tobacco: Never Used  Substance Use Topics  . Alcohol use: No  . Drug use: No    Frequency: 1.0 times per week    Review of Systems  Constitutional: No fever/chills. Positive lightheadedness without passing out.  Eyes: No visual changes. ENT: No sore throat. Cardiovascular: Positive chest pain. Respiratory: Denies shortness of breath. Gastrointestinal: Positive abdominal pain.  No nausea, no vomiting.  Positive diarrhea.  No constipation. Genitourinary: Negative for dysuria. Musculoskeletal: Negative for back pain. Skin: Negative for rash. Neurological: Negative for focal weakness or numbness. Positive frontal HA.   10-point ROS otherwise negative.  ____________________________________________   PHYSICAL EXAM:  VITAL SIGNS: ED Triage Vitals  Enc Vitals Group     BP 06/22/19 1612 (!) 158/72     Pulse Rate 06/22/19 1612 67     Resp 06/22/19 1612 17     Temp 06/22/19 1612 98.1 F (36.7 C)     Temp Source 06/22/19 1612 Oral     SpO2 06/22/19 1612 98 %   Constitutional: Alert and oriented. Well appearing and in no acute distress. Eyes: Conjunctivae are normal. PERRL.  Head: Atraumatic. Nose: No congestion/rhinnorhea. Mouth/Throat: Mucous membranes are moist.  Oropharynx non-erythematous. Neck: No stridor.  Cardiovascular: Normal rate, regular rhythm. Good peripheral circulation. Grossly normal heart sounds.   Respiratory: Normal respiratory effort.  No retractions. Lungs CTAB. Gastrointestinal: Soft with mild left sided tenderness in the upper and lower abdomen. No rebound or guarding. No distention.  Musculoskeletal: No gross deformities of extremities. Neurologic:  Normal speech and language.  No facial asymmetry or sensory deficit.  Patient with 5 out of 5 strength in the bilateral upper and lower extremities with normal coordination.  Normal sensation  throughout.  Skin:  Skin is warm, dry and intact. No rash noted.  ____________________________________________   LABS (all labs ordered are listed, but only abnormal results are displayed)  Labs Reviewed  COMPREHENSIVE METABOLIC PANEL - Abnormal; Notable for the following components:      Result Value   Glucose, Bld 165 (*)    BUN 28 (*)    Creatinine, Ser 2.15 (*)    GFR calc non Af Amer 21 (*)    GFR calc Af Amer 24 (*)    All other components within normal limits  URINALYSIS, ROUTINE W REFLEX MICROSCOPIC - Abnormal; Notable for the following components:   Color, Urine COLORLESS (*)    Specific Gravity, Urine 1.004 (*)    Glucose, UA 150 (*)    All other components within normal limits  LIPASE, BLOOD  CBC  TROPONIN I (HIGH SENSITIVITY)  TROPONIN I (HIGH SENSITIVITY)   ____________________________________________  EKG   EKG Interpretation  Date/Time:  Tuesday Jun 22 2019 16:12:36 EDT Ventricular Rate:  67 PR Interval:    QRS Duration: 75 QT Interval:  364 QTC Calculation: 385  R Axis:   50 Text Interpretation: Sinus rhythm ST changes inferiorly similar to prior. No STEMI Confirmed by Nanda Quinton [phone]) on 06/22/2019 6:00:46 PM       ____________________________________________  RADIOLOGY  DG Chest 2 View  Result Date: 06/22/2019 CLINICAL DATA:  Chest pain EXAM: CHEST - 2 VIEW COMPARISON:  01/27/2019 FINDINGS: The heart size and mediastinal contours are within normal limits. Both lungs are clear. Incompletely visualized hardware in the cervical spine. IMPRESSION: No active cardiopulmonary disease. Electronically Signed   By: Donavan Foil M.D.   On: 06/22/2019 18:08   CT Head Wo Contrast  Result Date: 06/22/2019 CLINICAL DATA:  Headache. EXAM: CT HEAD WITHOUT CONTRAST TECHNIQUE: Contiguous axial images were obtained from the base of the skull through the vertex without intravenous contrast. COMPARISON:  06/01/2017 FINDINGS: Brain: No evidence of acute infarction,  hemorrhage, hydrocephalus, extra-axial collection or mass lesion/mass effect. Vascular: No hyperdense vessel or unexpected calcification. Skull: Normal. Negative for fracture or focal lesion. Sinuses/Orbits: No acute finding. Other: None. IMPRESSION: No acute intracranial pathology. Electronically Signed   By: Constance Holster M.D.   On: 06/22/2019 18:24   CT Renal Stone Study  Result Date: 06/22/2019 CLINICAL DATA:  Abdominal pain. History of right nephrectomy, appendectomy, and hysterectomy. The patient is status post prior partial left nephrectomy. EXAM: CT ABDOMEN AND PELVIS WITHOUT CONTRAST TECHNIQUE: Multidetector CT imaging of the abdomen and pelvis was performed following the standard protocol without IV contrast. COMPARISON:  03/25/2018 FINDINGS: Lower chest: The lung bases are clear. The heart size is normal. Hepatobiliary: The liver is normal. Normal gallbladder.There is no biliary ductal dilation. Pancreas: Normal contours without ductal dilatation. No peripancreatic fluid collection. Spleen: Unremarkable. Adrenals/Urinary Tract: --Adrenal glands: Unremarkable. --Right kidney/ureter: The right kidney is surgically absent. There is no soft tissue abnormality within the right nephrectomy bed. --Left kidney/ureter: There are postsurgical changes of the lower pole of the left kidney likely related to the prior left partial nephrectomy. There is no left-sided hydronephrosis. Are no radiopaque kidney stones. --Urinary bladder: Unremarkable. Stomach/Bowel: --Stomach/Duodenum: No hiatal hernia or other gastric abnormality. Normal duodenal course and caliber. --Small bowel: Unremarkable. --Colon: Unremarkable. --Appendix: Surgically absent. Vascular/Lymphatic: Atherosclerotic calcification is present within the non-aneurysmal abdominal aorta, without hemodynamically significant stenosis. --No retroperitoneal lymphadenopathy. --No mesenteric lymphadenopathy. --No pelvic or inguinal lymphadenopathy.  Reproductive: Status post hysterectomy. No adnexal mass. Other: No ascites or free air. Multiple small fat containing periumbilical hernias are noted. Musculoskeletal. No acute displaced fractures. IMPRESSION: 1. No acute abdominopelvic abnormality. 2. Status post right nephrectomy and left partial nephrectomy. Aortic Atherosclerosis (ICD10-I70.0). Electronically Signed   By: Constance Holster M.D.   On: 06/22/2019 18:28    ____________________________________________   PROCEDURES  Procedure(s) performed:   Procedures  None  ____________________________________________   INITIAL IMPRESSION / ASSESSMENT AND PLAN / ED COURSE  Pertinent labs & imaging results that were available during my care of the patient were reviewed by me and considered in my medical decision making (see chart for details).   Patient presents emergency department with multiple complaints including headache, abdominal pain, chest pain, lightheadedness.  Chest pain is very atypical for ACS and PE.  EKG is reassuring and similar to prior.  No acute ischemic changes.  Plan for troponins and chest x-ray.  Abdominal pain is somewhat irregular.  She does have some mild tenderness in the left abdomen.  She does have history of right nephrectomy.  Plan for CT renal along with CT head given the patient's age and headache symptoms  but suspect tension headache clinically.  No concern for CNS infection.   CT imaging of the head and abdomen reviewed.  No acute findings.  Troponin negative x2.  Creatinine is 2.15 which is similar to prior values.  BUN also within range.  She is drinking fluids and tolerating PO here.  Feeling somewhat improved after small IV fluid bolus here.  Plan for PCP and nephrology follow-up.  Patient to call in the morning to schedule the next available follow-up appointment.  Discussed ED return precautions in detail. Family en route to drive the patient home.   ____________________________________________  FINAL CLINICAL IMPRESSION(S) / ED DIAGNOSES  Final diagnoses:  Epigastric pain  Precordial chest pain  Acute non intractable tension-type headache    MEDICATIONS GIVEN DURING THIS VISIT:  Medications  sodium chloride flush (NS) 0.9 % injection 3 mL (has no administration in time range)  sodium chloride 0.9 % bolus 500 mL (0 mLs Intravenous Stopped 06/22/19 1933)    Note:  This document was prepared using Dragon voice recognition software and may include unintentional dictation errors.  Nanda Quinton, MD, [facility] Emergency Medicine    Taygan Connell, [facility], MD 06/22/19 2259

## 2019-06-22 NOTE — ED Triage Notes (Signed)
Pt c/o headache, dizziness, abd pains and chest pains with nausea that started 3 days ago. Denies vomiting. Reports that had issues with loose stools after eating that has been going on for a while.

## 2019-06-27 ENCOUNTER — Emergency Department (HOSPITAL_COMMUNITY): Payer: Medicare PPO

## 2019-06-27 ENCOUNTER — Other Ambulatory Visit: Payer: Self-pay

## 2019-06-27 ENCOUNTER — Encounter (HOSPITAL_COMMUNITY): Payer: Self-pay

## 2019-06-27 ENCOUNTER — Emergency Department (HOSPITAL_COMMUNITY)
Admission: EM | Admit: 2019-06-27 | Discharge: 2019-06-27 | Disposition: A | Discharge status: Home / Self Care | Payer: Medicare PPO | Attending: Emergency Medicine | Admitting: Emergency Medicine

## 2019-06-27 DIAGNOSIS — I129 Hypertensive chronic kidney disease with stage 1 through stage 4 chronic kidney disease, or unspecified chronic kidney disease: Secondary | ICD-10-CM | POA: Insufficient documentation

## 2019-06-27 DIAGNOSIS — Z85528 Personal history of other malignant neoplasm of kidney: Secondary | ICD-10-CM | POA: Insufficient documentation

## 2019-06-27 DIAGNOSIS — E1065 Type 1 diabetes mellitus with hyperglycemia: Secondary | ICD-10-CM | POA: Diagnosis not present

## 2019-06-27 DIAGNOSIS — R739 Hyperglycemia, unspecified: Secondary | ICD-10-CM

## 2019-06-27 DIAGNOSIS — R519 Headache, unspecified: Secondary | ICD-10-CM | POA: Diagnosis not present

## 2019-06-27 DIAGNOSIS — Z794 Long term (current) use of insulin: Secondary | ICD-10-CM | POA: Insufficient documentation

## 2019-06-27 DIAGNOSIS — R110 Nausea: Secondary | ICD-10-CM | POA: Diagnosis not present

## 2019-06-27 DIAGNOSIS — E1022 Type 1 diabetes mellitus with diabetic chronic kidney disease: Secondary | ICD-10-CM | POA: Insufficient documentation

## 2019-06-27 DIAGNOSIS — R0789 Other chest pain: Secondary | ICD-10-CM | POA: Insufficient documentation

## 2019-06-27 DIAGNOSIS — R109 Unspecified abdominal pain: Secondary | ICD-10-CM

## 2019-06-27 DIAGNOSIS — R1084 Generalized abdominal pain: Secondary | ICD-10-CM | POA: Insufficient documentation

## 2019-06-27 DIAGNOSIS — K5900 Constipation, unspecified: Secondary | ICD-10-CM | POA: Diagnosis not present

## 2019-06-27 DIAGNOSIS — Z20822 Contact with and (suspected) exposure to covid-19: Secondary | ICD-10-CM | POA: Insufficient documentation

## 2019-06-27 DIAGNOSIS — N189 Chronic kidney disease, unspecified: Secondary | ICD-10-CM | POA: Insufficient documentation

## 2019-06-27 DIAGNOSIS — R1012 Left upper quadrant pain: Secondary | ICD-10-CM | POA: Diagnosis not present

## 2019-06-27 DIAGNOSIS — Z79899 Other long term (current) drug therapy: Secondary | ICD-10-CM | POA: Insufficient documentation

## 2019-06-27 DIAGNOSIS — R079 Chest pain, unspecified: Secondary | ICD-10-CM

## 2019-06-27 LAB — URINALYSIS, ROUTINE W REFLEX MICROSCOPIC
Bacteria, UA: NONE SEEN
Bilirubin Urine: NEGATIVE
Glucose, UA: >=500 mg/dL — AB
Hgb urine dipstick: NEGATIVE
Ketones, ur: NEGATIVE mg/dL
Leukocytes,Ua: NEGATIVE
Nitrite: NEGATIVE
Protein, ur: NEGATIVE mg/dL
Specific Gravity, Urine: 1.002 — ABNORMAL LOW (ref 1.005–1.030)
pH: 6 (ref 5.0–8.0)

## 2019-06-27 LAB — COMPREHENSIVE METABOLIC PANEL
ALT: 25 U/L (ref 0–44)
AST: 20 U/L (ref 15–41)
Albumin: 4.1 g/dL (ref 3.5–5.0)
Alkaline Phosphatase: 87 U/L (ref 38–126)
Anion gap: 7 (ref 5–15)
BUN: 29 mg/dL — ABNORMAL HIGH (ref 8–23)
CO2: 24 mmol/L (ref 22–32)
Calcium: 9.4 mg/dL (ref 8.9–10.3)
Chloride: 106 mmol/L (ref 98–111)
Creatinine, Ser: 1.73 mg/dL — ABNORMAL HIGH (ref 0.44–1.00)
GFR calc Af Amer: 32 mL/min — ABNORMAL LOW (ref >60)
GFR calc non Af Amer: 27 mL/min — ABNORMAL LOW (ref >60)
Glucose, Bld: 279 mg/dL — ABNORMAL HIGH (ref 70–99)
Potassium: 4.7 mmol/L (ref 3.5–5.1)
Sodium: 137 mmol/L (ref 135–145)
Total Bilirubin: 0.4 mg/dL (ref 0.3–1.2)
Total Protein: 7.6 g/dL (ref 6.5–8.1)

## 2019-06-27 LAB — RESPIRATORY PANEL BY RT PCR (FLU A&B, COVID)
Influenza A by PCR: NEGATIVE
Influenza B by PCR: NEGATIVE
SARS Coronavirus 2 by RT PCR: NEGATIVE

## 2019-06-27 LAB — TROPONIN I (HIGH SENSITIVITY)
Troponin I (High Sensitivity): 3 ng/L (ref <18)
Troponin I (High Sensitivity): 2 ng/L (ref <18)

## 2019-06-27 LAB — CBC
HCT: 39.9 % (ref 36.0–46.0)
Hemoglobin: 12.9 g/dL (ref 12.0–15.0)
MCH: 30.5 pg (ref 26.0–34.0)
MCHC: 32.3 g/dL (ref 30.0–36.0)
MCV: 94.3 fL (ref 80.0–100.0)
Platelets: 216 10*3/uL (ref 150–400)
RBC: 4.23 MIL/uL (ref 3.87–5.11)
RDW: 14 % (ref 11.5–15.5)
WBC: 4.7 10*3/uL (ref 4.0–10.5)
nRBC: 0 % (ref 0.0–0.2)

## 2019-06-27 LAB — LIPASE, BLOOD: Lipase: 47 U/L (ref 11–51)

## 2019-06-27 MED ORDER — MORPHINE SULFATE (PF) 4 MG/ML IV SOLN
4.0000 mg | Freq: Once | INTRAVENOUS | Status: AC
  Administered 2019-06-27: 4 mg via INTRAVENOUS
  Filled 2019-06-27: qty 1

## 2019-06-27 MED ORDER — SODIUM CHLORIDE 0.9 % IV SOLN: INTRAVENOUS | Status: DC

## 2019-06-27 MED ORDER — ONDANSETRON HCL 4 MG/2ML IJ SOLN
4.0000 mg | Freq: Once | INTRAMUSCULAR | Status: AC
  Administered 2019-06-27: 12:00:00 4 mg via INTRAVENOUS
  Filled 2019-06-27: qty 2

## 2019-06-27 MED ORDER — MORPHINE SULFATE (PF) 4 MG/ML IV SOLN
4.0000 mg | Freq: Once | INTRAVENOUS | Status: DC
  Filled 2019-06-27: qty 1

## 2019-06-27 MED ORDER — SODIUM CHLORIDE 0.9% FLUSH
3.0000 mL | Freq: Once | INTRAVENOUS | Status: AC
  Administered 2019-06-27: 12:00:00 3 mL via INTRAVENOUS

## 2019-06-27 MED ORDER — ONDANSETRON HCL 4 MG/2ML IJ SOLN
4.0000 mg | Freq: Once | INTRAMUSCULAR | Status: AC
  Administered 2019-06-27: 4 mg via INTRAVENOUS
  Filled 2019-06-27: qty 2

## 2019-06-27 MED ORDER — SODIUM CHLORIDE 0.9 % IV BOLUS
500.0000 mL | Freq: Once | INTRAVENOUS | Status: AC
  Administered 2019-06-27: 12:00:00 500 mL via INTRAVENOUS

## 2019-06-27 NOTE — ED Notes (Signed)
Provided pt with meal tray

## 2019-06-27 NOTE — ED Provider Notes (Signed)
3:25 PM-checkout from Dr. Tomi Bamberger to evaluate delta troponin and consider discharge if appropriate.  Patient presenting for evaluation of multiple symptoms, which are nonspecific.  Second evaluation for same, seen here several days ago.  On evaluation today, delta troponin negative, Covid and influenza negative, metabolic panel indicating hyperglycemia with mild azotemia and renal insufficiency.  Urinalysis with hyperglycemia.  Vital signs are reassuring.  3:50 PM-patient is eating a dinner tray, and tolerating well.  She appears comfortable.  At this time her major concern is bitemporal headache.  She began to tear up while talking about it.  She denies stress or tension and states "I leave it all up to him," indicating God.  I discussed the findings with her, and the lack of emergency indicators for hospitalization or intervention.  She states she will follow up with her PCP for further evaluation treatment.  MDM-nonspecific malaise, with hyperglycemia, headache and constipation.  I suspect she has an element of depression and possibly social isolation leading to malaise.  No indication for hospitalization at this time.  Recommend symptomatic care and close follow-up with PCP.     Daleen Bo, MD 06/27/19 [phone]

## 2019-06-27 NOTE — ED Provider Notes (Signed)
[facility] DEPT Provider Note   CSN: [id number] Arrival date & time: 06/27/19  1014     History Chief Complaint  Patient presents with  . Chest Pain  . Headache    [name] is a 79 y.o. female.  HPI  HPI: A 80 year old patient with a history of treated diabetes and hypertension presents for evaluation of chest pain. Initial onset of pain was more than 6 hours ago. The patient's chest pain is sharp and is not worse with exertion. The patient complains of nausea. The patient's chest pain is middle- or left-sided, is not well-localized, is not described as heaviness/pressure/tightness and does not radiate to the arms/jaw/neck. The patient denies diaphoresis. The patient has no history of stroke, has no history of peripheral artery disease, has not smoked in the past 90 days, has no relevant family history of coronary artery disease (first degree relative at less than age 2), has no history of hypercholesterolemia and does not have an elevated BMI (>=30). Patient presents to the ED for evaluation of headache chest pain abdominal pain diarrhea and nausea.  Patient states she started having symptoms earlier this week.  At that time the patient felt that her symptoms started 3 days previously.  She was having a bitemporal headache as well as nausea.  Patient was not having any vomiting but was having some loose stools.  She also had some right-sided chest discomfort that was not pleuritic or exertional.  Patient's ED evaluation included laboratory tests as well as CT scans of her head and her abdomen.  Patient states her symptoms have persisted since that evaluation.  She continues to feel nauseated and overall unwell but has not had any fevers.  She is not having any shortness of breath.  She is worried about her kidneys as she does have a history of prior nephrectomy she denies any dysuria specifically.  Past Medical History:  Diagnosis Date  . ACS (acute  coronary syndrome) ([place]) 12/27/2010  . AKI (acute kidney injury) ([place]) 03/25/2018  . Anginal pain ([place])   . Arthritis   . Asthma   . Diabetes mellitus    Type 1  . Dysrhythmia    "irregular heart beat"  . GERD (gastroesophageal reflux disease)   . History of urinary tract infection   . Hypercholesteremia   . Hypertension   . left renal ca dx'd 2012 (?)   surg only, left kidney  . Nocturia   . Reflux   . Renal failure (ARF), acute on chronic (HCC)   . Shortness of breath dyspnea    pt denies; states can climb stairs w/o difficulty   . Sleep apnea    does not use c-pap machine  . Tingling in extremities    legs bilat  . Tinnitus   . Urinary frequency   . Urinary incontinence   . Vertigo    occurs when lying flat     Patient Active Problem List   Diagnosis Date Noted  . Pain due to onychomycosis of toenails of both feet 09/01/2018  . Left shoulder pain   . Limited joint range of motion (ROM)   . Chest pain 10/23/2015  . Diabetes mellitus type 2 in obese ([place]) 10/23/2015  . Chest pain with high risk for cardiac etiology 07/28/2014  . Diabetes mellitus 12/27/2010  . Hypertension 12/27/2010  . Hyperlipidemia 12/27/2010  . GERD (gastroesophageal reflux disease) 12/27/2010  . Renal cell adenocarcinoma ([place]) 12/27/2010  . Arthritis 12/27/2010  . Cervical  stenosis of spine 12/27/2010  . Carpal tunnel syndrome 12/27/2010  . Asthma 12/27/2010    Past Surgical History:  Procedure Laterality Date  . ABDOMINAL HYSTERECTOMY    . APPENDECTOMY    . CARDIAC CATHETERIZATION N/A 07/28/2014   Procedure: Left Heart Cath and Coronary Angiography;  Surgeon: [name], MD;  Location: [facility];  Service: Cardiovascular;  Laterality: N/A;  . CYSTOSCOPY WITH RETROGRADE PYELOGRAM, URETEROSCOPY AND STENT PLACEMENT Right 02/09/2015   Procedure: CYSTOSCOPY WITH RETROGRADE PYELOGRAM AND URETEROSCOPY ;  Surgeon: Raynelle Bring, MD;  Location: [facility];  Service: Urology;  Laterality: Right;    . LAPAROSCOPIC NEPHRECTOMY Right 03/20/2015   Procedure: LAPAROSCOPIC NEPHRECTOMY;  Surgeon: Raynelle Bring, MD;  Location: [facility];  Service: Urology;  Laterality: Right;  . PERIPHERAL VASCULAR CATHETERIZATION N/A 07/28/2014   Procedure: Aortic Arch Angiography;  Surgeon: [name], MD;  Location: [facility];  Service: Cardiovascular;  Laterality: N/A;  . RENAL MASS EXCISION    . SPINE SURGERY       OB History   No obstetric history on file.     Family History  Problem Relation Age of Onset  . Hypertension Mother   . Coronary artery disease Other   . Breast cancer Cousin     Social History   Tobacco Use  . Smoking status: Never Smoker  . Smokeless tobacco: Never Used  Substance Use Topics  . Alcohol use: No  . Drug use: No    Frequency: 1.0 times per week    Home Medications Prior to Admission medications   Medication Sig Start Date End Date Taking? Authorizing Provider  albuterol (PROVENTIL,VENTOLIN) 90 MCG/ACT inhaler Inhale 2 puffs into the lungs every 4 (four) hours as needed for wheezing or shortness of breath.    Yes [provider]  amLODipine (NORVASC) 5 MG tablet TAKE 1 TABLET BY MOUTH EVERY DAY Patient taking differently: Take 5 mg by mouth daily.  12/29/18  Yes Patwardhan, Reynold Bowen, MD  aspirin EC 81 MG tablet Take 81 mg by mouth at bedtime.   Yes [provider]  atorvastatin (LIPITOR) 10 MG tablet Take 10 mg by mouth every morning.    Yes [provider]  cholecalciferol (VITAMIN D) 1000 units tablet Take 1,000 Units by mouth daily.   Yes [provider]  donepezil (ARICEPT) 5 MG tablet Take 5 mg by mouth at bedtime.  03/29/17  Yes [provider]  gabapentin (NEURONTIN) 300 MG capsule Take 1 capsule (300 mg total) by mouth at bedtime. 07/16/16  Yes Regal, Tamala Fothergill, DPM  Insulin Degludec (TRESIBA FLEXTOUCH) 200 UNIT/ML SOPN Inject 78 Units into the skin daily before breakfast.    Yes [provider]   metoprolol succinate (TOPROL-XL) 100 MG 24 hr tablet TAKE 1 TABLET BY MOUTH EVERY DAY Patient taking differently: Take 100 mg by mouth daily.  12/28/18  Yes Patwardhan, Manish J, MD  mirabegron ER (MYRBETRIQ) 50 MG TB24 tablet Take 50 mg by mouth daily.   Yes [provider]  Multiple Vitamins-Minerals (CENTRUM SILVER ADULT 50+ PO) Take 1 tablet by mouth daily.   Yes [provider]  nitroGLYCERIN (NITROSTAT) 0.4 MG SL tablet Place 0.4 mg under the tongue every 5 (five) minutes as needed for chest pain.   Yes [provider]  pantoprazole (PROTONIX) 40 MG tablet Take 40 mg by mouth daily as needed (heartburn).  03/25/19  Yes [provider]  lidocaine (LIDODERM) 5 % Place 1 patch onto  the skin daily. Remove & Discard patch within 12 hours or as directed by MD Patient not taking: Reported on 06/22/2019 03/27/18   Reyne Dumas, MD  TRADJENTA 5 MG TABS tablet Take 5 mg by mouth daily. 06/24/19   [provider]    Allergies    Sulfa antibiotics  Review of Systems   Review of Systems  All other systems reviewed and are negative.   Physical Exam Updated Vital Signs BP (!) 123/100   Pulse (!) 59   Temp 97.7 F (36.5 C) (Oral)   Resp 16   SpO2 99%   Physical Exam Vitals and nursing note reviewed.  Constitutional:      General: She is not in acute distress.    Appearance: She is well-developed.  HENT:     Head: Normocephalic and atraumatic.     Right Ear: External ear normal.     Left Ear: External ear normal.  Eyes:     General: No scleral icterus.       Right eye: No discharge.        Left eye: No discharge.     Conjunctiva/sclera: Conjunctivae normal.  Neck:     Trachea: No tracheal deviation.  Cardiovascular:     Rate and Rhythm: Normal rate and regular rhythm.  Pulmonary:     Effort: Pulmonary effort is normal. No respiratory distress.     Breath sounds: Normal breath sounds. No stridor. No wheezing or rales.  Abdominal:      General: Bowel sounds are normal. There is no distension.     Palpations: Abdomen is soft.     Tenderness: There is abdominal tenderness. There is no guarding or rebound.     Comments: Generalized tenderness  Musculoskeletal:        General: No tenderness.     Cervical back: Neck supple.  Skin:    General: Skin is warm and dry.     Findings: No rash.  Neurological:     Mental Status: She is alert.     Cranial Nerves: No cranial nerve deficit (no facial droop, extraocular movements intact, no slurred speech).     Sensory: No sensory deficit.     Motor: No abnormal muscle tone or seizure activity.     Coordination: Coordination normal.     ED Results / Procedures / Treatments   Labs (all labs ordered are listed, but only abnormal results are displayed) Labs Reviewed  COMPREHENSIVE METABOLIC PANEL - Abnormal; Notable for the following components:      Result Value   Glucose, Bld 279 (*)    BUN 29 (*)    Creatinine, Ser 1.73 (*)    GFR calc non Af Amer 27 (*)    GFR calc Af Amer 32 (*)    All other components within normal limits  URINALYSIS, ROUTINE W REFLEX MICROSCOPIC - Abnormal; Notable for the following components:   Color, Urine COLORLESS (*)    Specific Gravity, Urine 1.002 (*)    Glucose, UA >=500 (*)    All other components within normal limits  RESPIRATORY PANEL BY RT PCR (FLU A&B, COVID)  CBC  LIPASE, BLOOD  TROPONIN I (HIGH SENSITIVITY)  TROPONIN I (HIGH SENSITIVITY)    EKG EKG Interpretation  Date/Time:  Sunday Jun 27 2019 10:31:19 EDT Ventricular Rate:  59 PR Interval:    QRS Duration: 80 QT Interval:  420 QTC Calculation: 416 R Axis:   47 Text Interpretation: Sinus arrhythmia No significant change since last tracing Confirmed  by Dorie Rank [phone]) on 06/27/2019 10:55:26 AM   Radiology DG Abd Acute W/Chest  Result Date: 06/27/2019 CLINICAL DATA:  Chest pain; abdominal pain with nausea EXAM: DG ABDOMEN ACUTE W/ 1V CHEST COMPARISON:  Abdomen series Jun 22, 2015; CT abdomen and pelvis Jun 22, 2019 FINDINGS: PA chest: Lungs are clear. Heart size and pulmonary vascularity are normal. No adenopathy. Supine and upright abdomen: There is diffuse stool throughout the colon. There is no bowel dilatation or air-fluid level to suggest bowel obstruction. No free air. There are apparent phleboliths in the left pelvis. IMPRESSION: Diffuse stool throughout colon. Question a degree of underlying constipation. No bowel obstruction or free air. Lungs are clear. Electronically Signed   By: Lowella Grip III M.D.   On: 06/27/2019 11:38    Procedures Procedures (including critical care time)  Medications Ordered in ED Medications  sodium chloride 0.9 % bolus 500 mL (0 mLs Intravenous Stopped 06/27/19 1302)    And  0.9 %  sodium chloride infusion (has no administration in time range)  morphine 4 MG/ML injection 4 mg (0 mg Intravenous Hold 06/27/19 1400)  sodium chloride flush (NS) 0.9 % injection 3 mL (3 mLs Intravenous Given 06/27/19 1139)  ondansetron (ZOFRAN) injection 4 mg (4 mg Intravenous Given 06/27/19 1138)  morphine 4 MG/ML injection 4 mg (4 mg Intravenous Given 06/27/19 1138)  ondansetron (ZOFRAN) injection 4 mg (4 mg Intravenous Given 06/27/19 1256)    ED Course  I have reviewed the triage vital signs and the nursing notes.  Pertinent labs & imaging results that were available during my care of the patient were reviewed by me and considered in my medical decision making (see chart for details).  Clinical Course as of Jun 27 1518  Sun Jun 27, 2019  1340 Labs reviewed.  Urinalysis does not suggest UTI.  Covid test is negative.  Initial troponin is normal.  CBC is unremarkable.   [JK]  1340 Renal function is stable   [JK]  1340 Acute abdominal series suggests a component of constipation but no obstruction   [JK]  1341 CT scans from May 4 reviewed.  No acute abnormalities on her abdominal CT scan.  No acute abnormalities noted on head CT   [JK]  1352  Discussed findings with patient.  She is concerned about her frequent urination.  Some of this may be related to her glucosuria.  I explained to the patient that there is no sign of infection and her CT scan 4 days ago.   [JK]    Clinical Course User Index [JK] Dorie Rank, MD   MDM Rules/Calculators/A&P HEAR Score: 4                    Pt presents with multiple complaints, headache, chest pain, abd pain, urinary frequency.  Recent visit for similar sx.  CT scans unremarkable on the 4th.  Doubt stroke, tia, SAH, mengitis.  Doubt colitis, diverticulitis, obstruction considering recent workup.  Chest pain not suggestive of acs.  With her history will check delta trop.  Plan on delta trop.  If negative, appears stable for outpatient follow up.  Dr Eulis Foster will follow up on delta trop.  Final Clinical Impression(s) / ED Diagnoses Final diagnoses:  Chest pain, unspecified type  Abdominal pain, unspecified abdominal location    Rx / DC Orders ED Discharge Orders    None       Dorie Rank, MD 06/27/19 [phone]

## 2019-06-27 NOTE — Discharge Instructions (Addendum)
Testing today did not show any acute problems with your heart or lungs.  Your abdominal x-ray indicates you might have constipation.  Your headache appears nonspecific and may be related to muscle tension.  Your blood sugar is high so make sure you watch your carbohydrate intake and take your diabetes medication.  You will also help to drink 3 to 4 glasses of water every day.  To help treat constipation, increase fiber in your diet and take Colace 100 mg twice a day for several weeks.  Use Tylenol if needed for headache.  Make sure you follow-up with your PCP for further evaluation and treatment as soon as possible.

## 2019-06-27 NOTE — ED Triage Notes (Addendum)
Pt c/o headache and central chest pain that started again this morning. See previous chart and blood work.   9/10 headache 6/10 chest pain   C/o nausea and diarrhea  Denies emesis   Reports being here last week for same and reports pain eased off but has now came back this morning.   A/Ox4 Wheelchair in triage patient able to stand and sit on stretcher with 1 assistance.

## 2019-06-28 ENCOUNTER — Other Ambulatory Visit: Payer: Self-pay | Admitting: Cardiology

## 2019-06-28 DIAGNOSIS — I10 Essential (primary) hypertension: Secondary | ICD-10-CM

## 2019-07-05 DIAGNOSIS — E1122 Type 2 diabetes mellitus with diabetic chronic kidney disease: Secondary | ICD-10-CM | POA: Diagnosis not present

## 2019-07-05 DIAGNOSIS — N189 Chronic kidney disease, unspecified: Secondary | ICD-10-CM | POA: Diagnosis not present

## 2019-07-05 DIAGNOSIS — I10 Essential (primary) hypertension: Secondary | ICD-10-CM | POA: Diagnosis not present

## 2019-07-13 DIAGNOSIS — H02401 Unspecified ptosis of right eyelid: Secondary | ICD-10-CM | POA: Diagnosis not present

## 2019-07-13 DIAGNOSIS — Z9889 Other specified postprocedural states: Secondary | ICD-10-CM | POA: Diagnosis not present

## 2019-07-28 DIAGNOSIS — E119 Type 2 diabetes mellitus without complications: Secondary | ICD-10-CM | POA: Diagnosis not present

## 2019-07-28 DIAGNOSIS — G4733 Obstructive sleep apnea (adult) (pediatric): Secondary | ICD-10-CM | POA: Diagnosis not present

## 2019-07-28 DIAGNOSIS — I10 Essential (primary) hypertension: Secondary | ICD-10-CM | POA: Diagnosis not present

## 2019-07-28 DIAGNOSIS — K219 Gastro-esophageal reflux disease without esophagitis: Secondary | ICD-10-CM | POA: Diagnosis not present

## 2019-07-28 DIAGNOSIS — N183 Chronic kidney disease, stage 3 unspecified: Secondary | ICD-10-CM | POA: Diagnosis not present

## 2019-07-28 DIAGNOSIS — Z85528 Personal history of other malignant neoplasm of kidney: Secondary | ICD-10-CM | POA: Diagnosis not present

## 2019-07-28 DIAGNOSIS — Z794 Long term (current) use of insulin: Secondary | ICD-10-CM | POA: Diagnosis not present

## 2019-07-28 DIAGNOSIS — Z905 Acquired absence of kidney: Secondary | ICD-10-CM | POA: Diagnosis not present

## 2019-07-28 DIAGNOSIS — H02401 Unspecified ptosis of right eyelid: Secondary | ICD-10-CM | POA: Diagnosis not present

## 2019-08-02 DIAGNOSIS — E1165 Type 2 diabetes mellitus with hyperglycemia: Secondary | ICD-10-CM | POA: Diagnosis not present

## 2019-08-16 DIAGNOSIS — G4733 Obstructive sleep apnea (adult) (pediatric): Secondary | ICD-10-CM | POA: Diagnosis not present

## 2019-09-16 ENCOUNTER — Ambulatory Visit: Payer: Medicare PPO | Admitting: Adult Health

## 2019-09-21 DIAGNOSIS — C642 Malignant neoplasm of left kidney, except renal pelvis: Secondary | ICD-10-CM | POA: Diagnosis not present

## 2019-09-21 DIAGNOSIS — C641 Malignant neoplasm of right kidney, except renal pelvis: Secondary | ICD-10-CM | POA: Diagnosis not present

## 2019-09-21 DIAGNOSIS — E1122 Type 2 diabetes mellitus with diabetic chronic kidney disease: Secondary | ICD-10-CM | POA: Diagnosis not present

## 2019-09-21 DIAGNOSIS — D631 Anemia in chronic kidney disease: Secondary | ICD-10-CM | POA: Diagnosis not present

## 2019-09-21 DIAGNOSIS — I129 Hypertensive chronic kidney disease with stage 1 through stage 4 chronic kidney disease, or unspecified chronic kidney disease: Secondary | ICD-10-CM | POA: Diagnosis not present

## 2019-09-21 DIAGNOSIS — N184 Chronic kidney disease, stage 4 (severe): Secondary | ICD-10-CM | POA: Diagnosis not present

## 2019-09-27 IMAGING — CR DG CHEST 2V
2 series · 2 of 2 positions shown · non-contrast
Comparison: 05/03/2016

CLINICAL DATA: 76-year-old female with a history of asthma. History
of bilateral kidney cancer

EXAM:
CHEST  2 VIEW

[w chest pa]
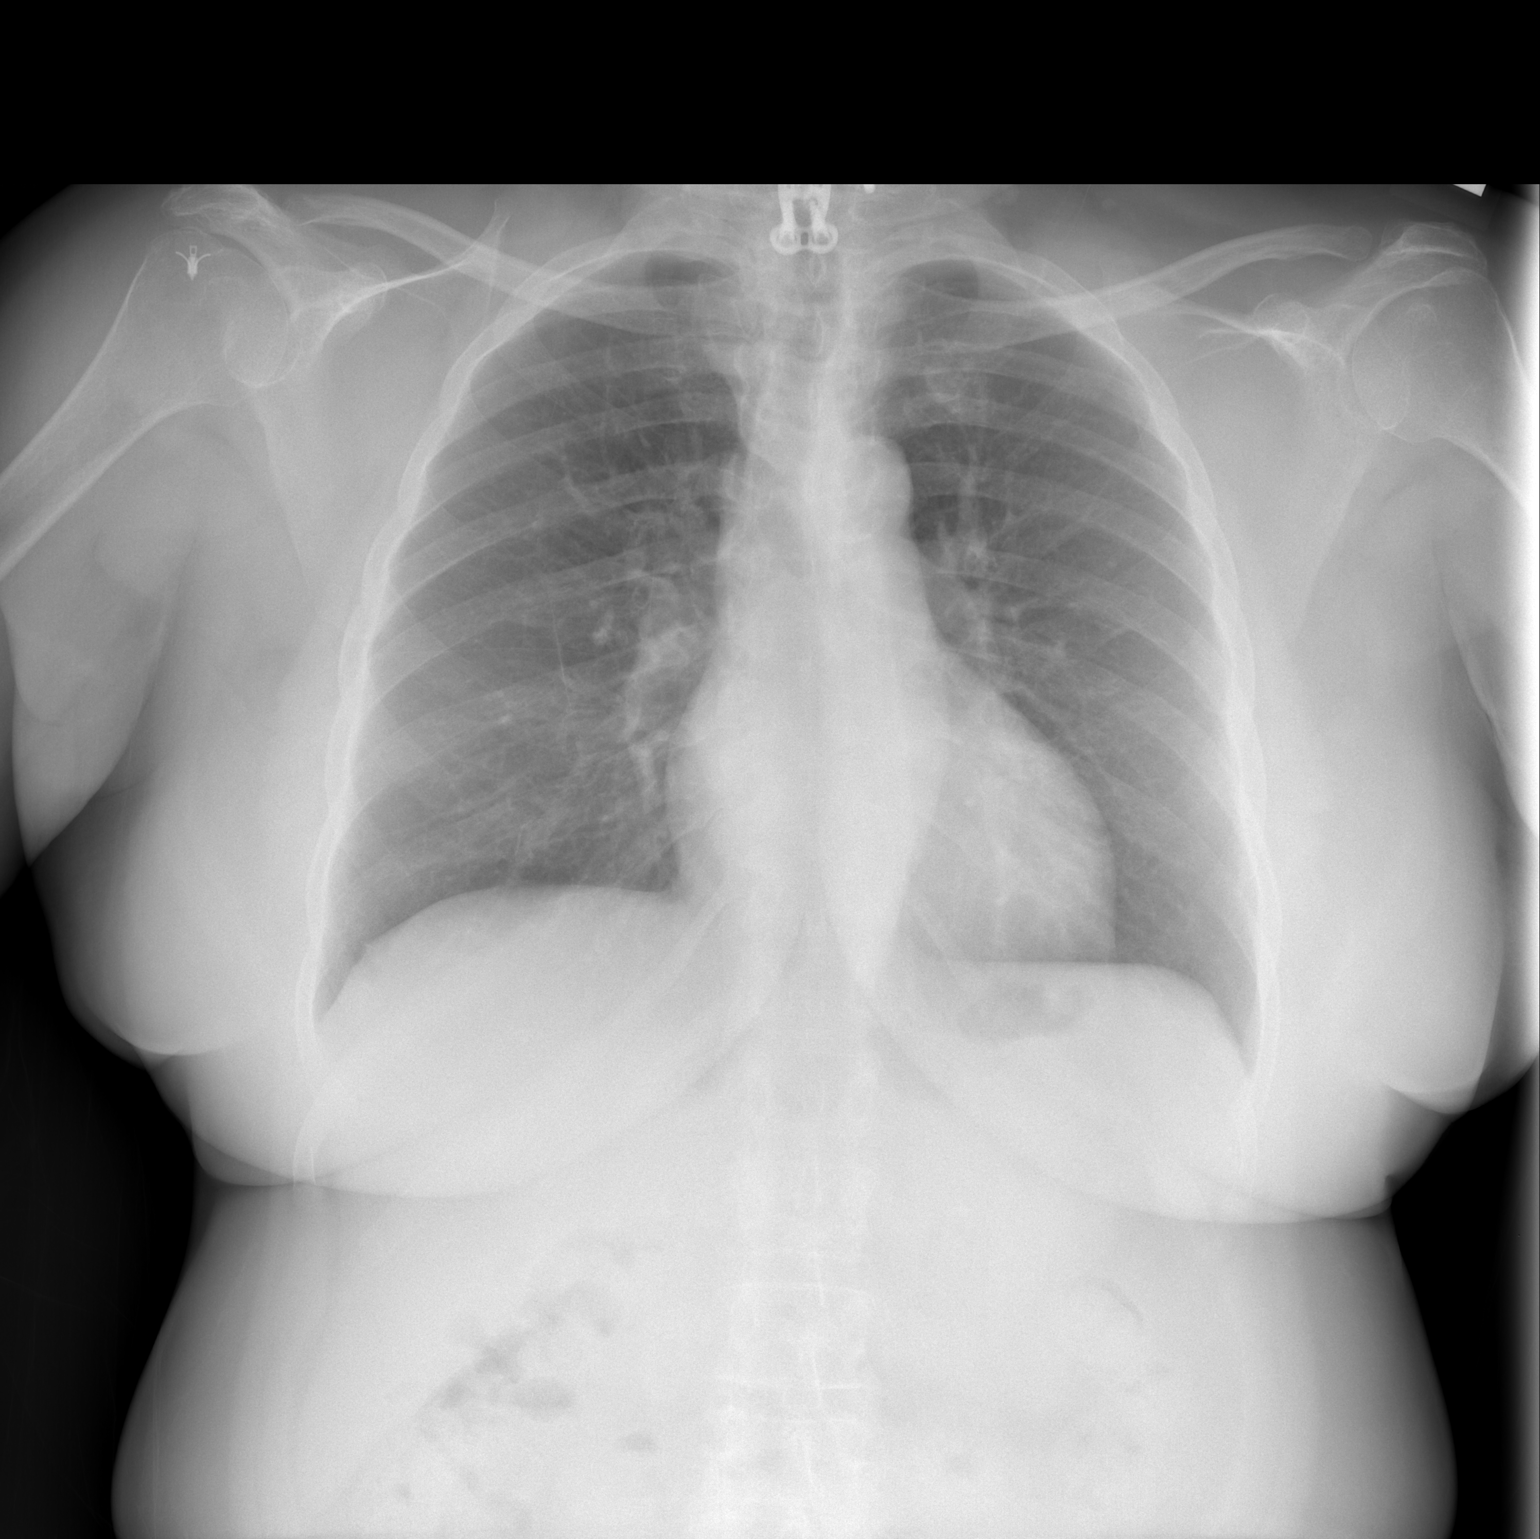

[w chest lat]
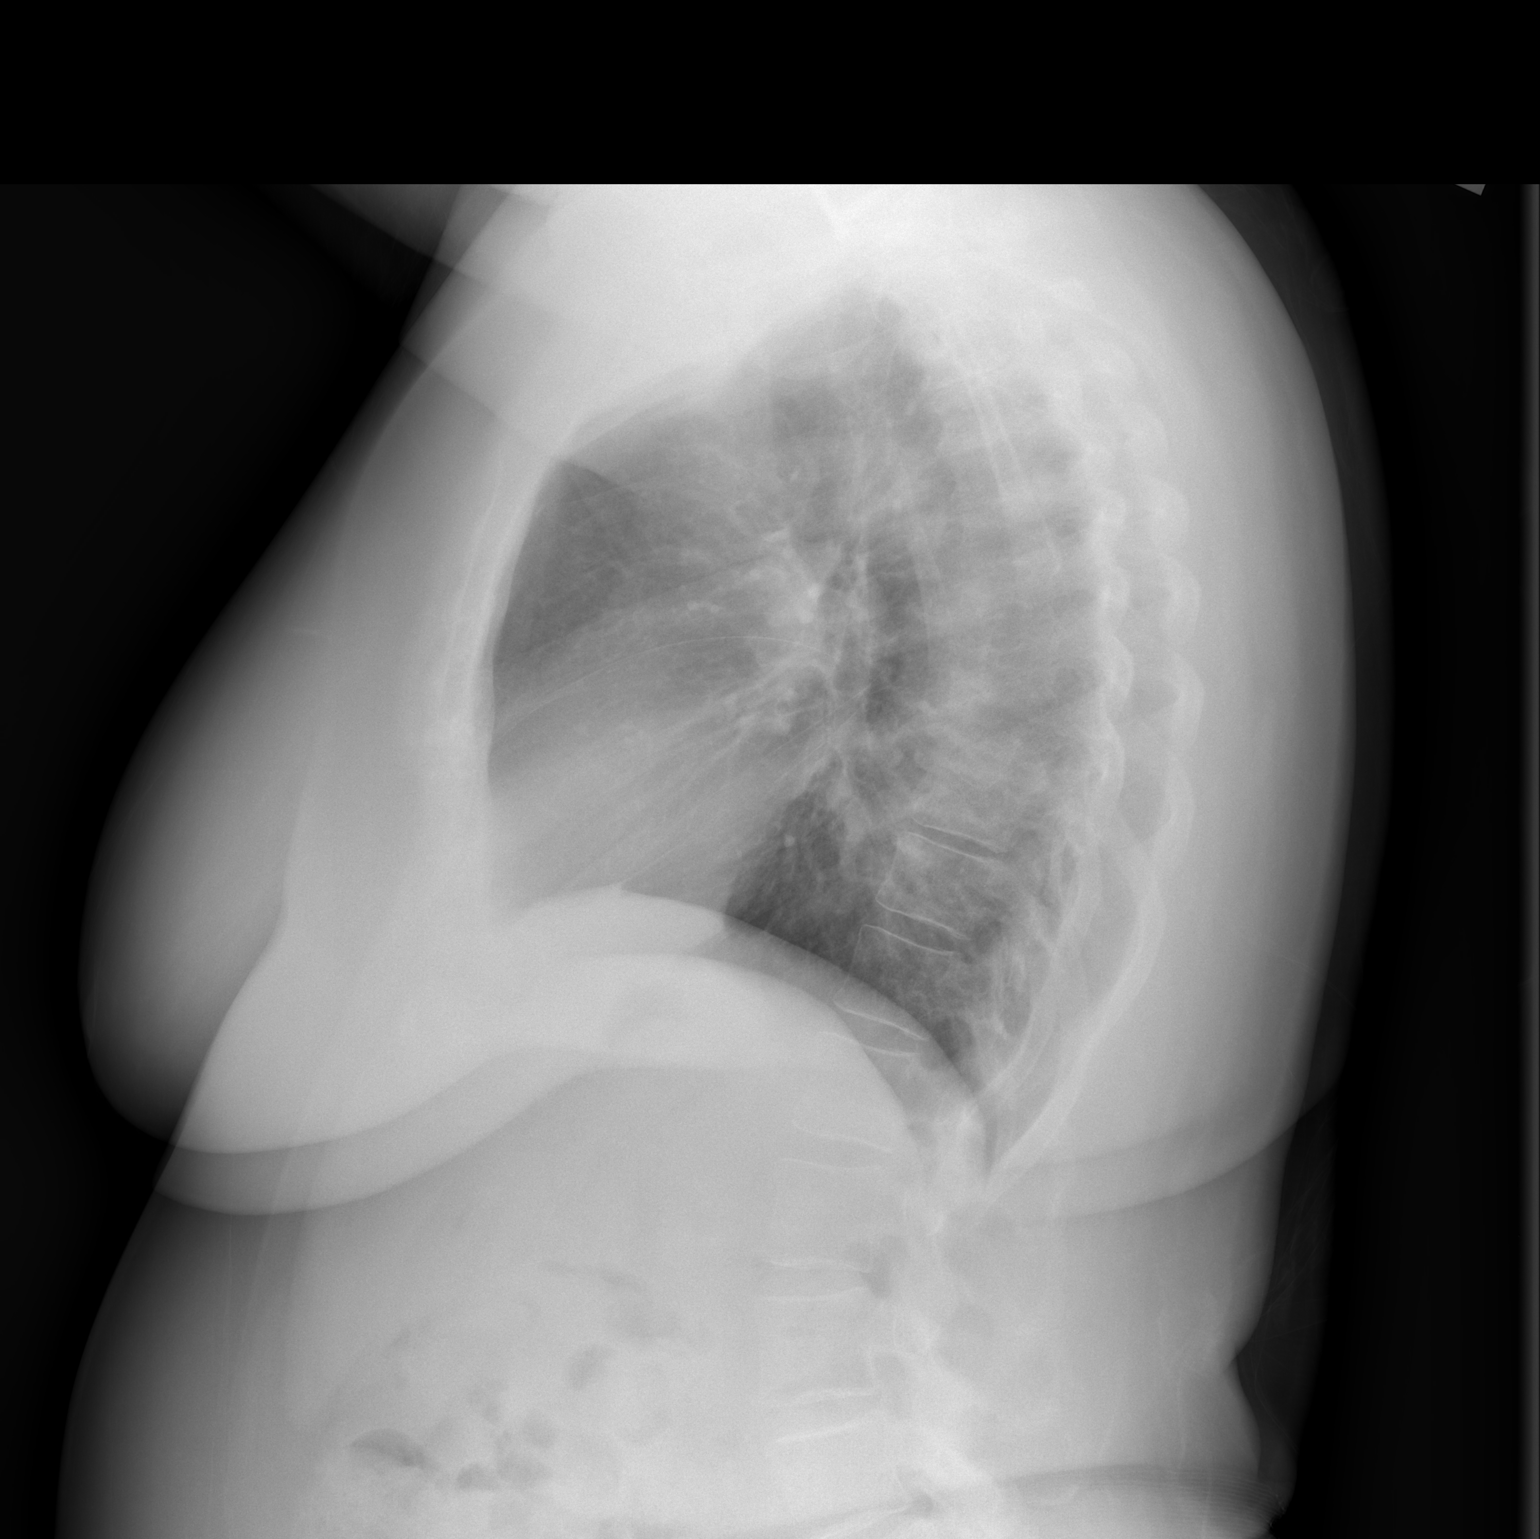

[2 of 2 positions shown; findings below may reference images not displayed]

FINDINGS: Cardiomediastinal silhouette within normal limits in size and
contour. No evidence of central vascular congestion. No pneumothorax
or pleural effusion. No confluent airspace disease. No
hyperinflation.

Surgical changes of the right humerus and at the cervical region.
IMPRESSION: Negative for acute cardiopulmonary disease

## 2019-10-04 DIAGNOSIS — N184 Chronic kidney disease, stage 4 (severe): Secondary | ICD-10-CM | POA: Diagnosis not present

## 2019-10-04 DIAGNOSIS — I10 Essential (primary) hypertension: Secondary | ICD-10-CM | POA: Diagnosis not present

## 2019-10-04 DIAGNOSIS — E1122 Type 2 diabetes mellitus with diabetic chronic kidney disease: Secondary | ICD-10-CM | POA: Diagnosis not present

## 2019-10-10 ENCOUNTER — Encounter: Payer: Self-pay | Admitting: Neurology

## 2019-10-19 DIAGNOSIS — Z794 Long term (current) use of insulin: Secondary | ICD-10-CM | POA: Diagnosis not present

## 2019-10-19 DIAGNOSIS — I129 Hypertensive chronic kidney disease with stage 1 through stage 4 chronic kidney disease, or unspecified chronic kidney disease: Secondary | ICD-10-CM | POA: Diagnosis not present

## 2019-10-19 DIAGNOSIS — N184 Chronic kidney disease, stage 4 (severe): Secondary | ICD-10-CM | POA: Diagnosis not present

## 2019-10-19 DIAGNOSIS — E1122 Type 2 diabetes mellitus with diabetic chronic kidney disease: Secondary | ICD-10-CM | POA: Diagnosis not present

## 2019-10-20 ENCOUNTER — Telehealth: Payer: Self-pay | Admitting: Neurology

## 2019-10-20 NOTE — Telephone Encounter (Signed)
Pt is requesting a call back from the nurse regarding her sleep machine. She had to r/s her follow-up appointment to 09/21 due to a family emergency and is hoping to have some questions answered before then. Best call back # is [phone].

## 2019-10-20 NOTE — Telephone Encounter (Signed)
I called pt. No answer, left a message asking pt to call me back.   

## 2019-10-21 ENCOUNTER — Ambulatory Visit: Payer: Medicare PPO | Admitting: Neurology

## 2019-10-21 NOTE — Telephone Encounter (Addendum)
I called pt. She reports she is having trouble turning her cpap on. Pt reports the machine will not turn on at all. I asked if she had called her DME and she sts she did call but no one has called her back. I have sent message to DME asking for them to call her. Pt advised to call back in a few days if she has not received a cal from the DME she verbalized understanding.

## 2019-11-09 ENCOUNTER — Other Ambulatory Visit: Payer: Self-pay

## 2019-11-09 ENCOUNTER — Encounter: Payer: Self-pay | Admitting: Neurology

## 2019-11-09 ENCOUNTER — Ambulatory Visit: Payer: Medicare PPO | Admitting: Neurology

## 2019-11-09 VITALS — BP 132/76 | HR 60 | Ht 59.0 in | Wt 151.0 lb

## 2019-11-09 DIAGNOSIS — Z9989 Dependence on other enabling machines and devices: Secondary | ICD-10-CM

## 2019-11-09 DIAGNOSIS — G4733 Obstructive sleep apnea (adult) (pediatric): Secondary | ICD-10-CM | POA: Diagnosis not present

## 2019-11-09 DIAGNOSIS — R413 Other amnesia: Secondary | ICD-10-CM

## 2019-11-09 MED ORDER — DONEPEZIL HCL 10 MG PO TABS: 10.0000 mg | ORAL_TABLET | Freq: Every day | ORAL | 3 refills | Status: DC

## 2019-11-09 NOTE — Progress Notes (Signed)
Subjective:    Patient ID: [name] is a 79 y.o. female.  HPI     Interim history:   [name] is an 79 year old right-handed woman with an underlying complex medical history of diabetes, asthma, reflux disease, hypertension, hyperlipidemia, kidney cancer with status post left nephrectomy, sleep apnea, vertigo, chronic kidney disease, arthritis and obesity, who presents for follow-up consultation of her memory loss and sleep apnea.  The patient is unaccompanied today.  I last saw her on 09/07/18, at which time she felt stable.  She was compliant with her CPAP.  She was on generic Aricept per PCP.  Today, 11/09/2019: I reviewed her CPAP compliance data from 09/11/2019 through 10/10/2019, which is a total of 30 days, during which time she used her machine 29 days with percent use days greater than 4 hours at 93%, indicating excellent compliance with an average usage of 7 hours and 3 minutes, residual AHI at goal at 2.9/h, leak acceptable with a 95th percentile at 5.1 L/min, pressure of 9 cm with EPR of 3.  She reports that she cannot use her CPAP currently as it does not turn on, she had contacted her DME company and was supposed to take the machine in but then lost the information as to where to go and when to go.  She has been compliant with treatment up until about 3 weeks ago.  She feels stable with regards to her memory, sometimes she has word finding difficulty.  She continues to take Aricept but it looks like it is only 5 mg each evening.  She does not remember increasing it.  She would not be opposed to increasing it at this time to 10 mg daily.  She has not had any new symptoms. She denies any significant symptoms of anxiety or depression but does report sometimes feeling irritable and have low patients.  She never used to be like this.  She has not talked to Dr. Berdine Addison about it yet.   The patient's allergies, current medications, family history, past medical history, past social history, past  surgical history and problem list were reviewed and updated as appropriate.    Previously:   I saw her on 03/05/2018, at which time she was doing fairly well, she was compliant with CPAP and we talked about recent test results including her sleep studies.  She was on Aricept and tolerating it, MMSE was 29 at the time.   I reviewed her CPAP compliance data from 08/04/2018 through 09/02/2018 which is a total of 30 days, during which time she used her machine every night with percent used days greater than 4 hours at 97%, indicating excellent compliance with an average usage of 7 hours and 42 minutes, residual AHI 3.6/h, leak acceptable with a 95th percentile at 11.2 L/min on a pressure of 9 cm with EPR of 3.       I first met her on 07/09/2017 at the request of her nephrologist, at which time the patient reported short-term memory issues as well as a prior diagnosis of OSA. Her MMSE was 27 at the time. I suggested we proceed with further testing including brain MRI and sleep studies. She had a brain MRI without contrast on 07/22/2017 and I reviewed the results: IMPRESSION: Slightly abnormal MRI scan of the brain showing age-appropriate changes of chronic microvascular ischemia and generalized cerebral atrophy.  No significant change compared with previous CT head dated 06/01/2017. She had a baseline sleep study on 07/29/2017, followed by a CPAP  titration study. I went over her test results with her in detail today. Baseline sleep study from 07/29/2017 showed a sleep latency of 30.5 minutes, REM latency of 129.5 minutes, sleep efficiency reduced at 77.3%. Total AHI was in the mild range of 5.6 per hour, average oxygen saturation 94%, nadir of 86%. Based on her medical history I suggested we proceed with treatment of her mild sleep apnea with CPAP. She had a titration study on 08/26/2017. She was fitted with nasal pillows and CPAP was titrated from 5 cm to 8 cm. On the final pressure her AHI was 0 per hour with  nonsupine non-REM sleep achieved an O2 nadir of 93%. Sleep efficiency was markedly reduced with a very long sleep latency. Nevertheless, I suggested she try home CPAP therapy at a pressure of 8 cm via nasal pillows.   I reviewed her CPAP compliance data from 02/02/2018 through 03/03/2018 which is a total of 30 days, during which time she used her CPAP every night with percent used days greater than 4 hours at 100%, indicating superb compliance with an average usage of 8 hours and 4 minutes, residual AHI at goal at 1.9 per hour, leak acceptable with the 95th percentile at 13.1 L/m on a pressure of 9 cm with EPR of 3.    07/09/17: (She) reports short-term memory loss for the past year. She has issues remembering names and dates, short-term memory loss. She does not have a family history of dementia as far she knows. She is an only child. She has one daughter who lives in [place] and her son died at the age of 7. She is widowed and lives alone. She believes that her memory loss is due to age. I reviewed your office note from 05/16/2017, which you kindly included. She has been on Aricept.   She has a diagnosis of sleep apnea but could not tolerate CPAP. She would be willing to consider CPAP again.    She had a brain MRI without contrast on 10/04/2005 and I reviewed the results: IMPRESSION:  1. No acute intracranial abnormality.   2. Greater than expected subcortical T2 hyperintensities bilaterally. These were described on previous MRI of 2002. They remain non specific. Differential diagnosis includes microvascular ischemia, chronic migraine headaches, demyelinating process or vasculitis or the sequelae of prior infection or inflammation.   3. Mild cerebellar tonsillar ectopia. This is likely to be incidental.   She had a head CT without contrast on 06/01/2017 and I reviewed the results: IMPRESSION: No acute intracranial abnormalities. She presented to the emergency room on 06/01/2017 with blurry vision,  abdominal pain, nausea and dizziness. I reviewed the emergency room records. She was treated symptomatically with antiemetic medication and IV fluids for suspected viral GI illness.  Her Past Medical History Is Significant For: Past Medical History:  Diagnosis Date  . ACS (acute coronary syndrome) ([place]) 12/27/2010  . AKI (acute kidney injury) ([place]) 03/25/2018  . Anginal pain ([place])   . Arthritis   . Asthma   . Diabetes mellitus    Type 1  . Dysrhythmia    "irregular heart beat"  . GERD (gastroesophageal reflux disease)   . History of urinary tract infection   . Hypercholesteremia   . Hypertension   . left renal ca dx'd 2012 (?)   surg only, left kidney  . Nocturia   . Reflux   . Renal failure (ARF), acute on chronic ([facility])   . Shortness of breath dyspnea    pt denies; states  can climb stairs w/o difficulty   . Sleep apnea    does not use c-pap machine  . Tingling in extremities    legs bilat  . Tinnitus   . Urinary frequency   . Urinary incontinence   . Vertigo    occurs when lying flat     Her Past Surgical History Is Significant For: Past Surgical History:  Procedure Laterality Date  . ABDOMINAL HYSTERECTOMY    . APPENDECTOMY    . CARDIAC CATHETERIZATION N/A 07/28/2014   Procedure: Left Heart Cath and Coronary Angiography;  Surgeon: [name], MD;  Location: [facility];  Service: Cardiovascular;  Laterality: N/A;  . CYSTOSCOPY WITH RETROGRADE PYELOGRAM, URETEROSCOPY AND STENT PLACEMENT Right 02/09/2015   Procedure: CYSTOSCOPY WITH RETROGRADE PYELOGRAM AND URETEROSCOPY ;  Surgeon: Raynelle Bring, MD;  Location: [facility];  Service: Urology;  Laterality: Right;  . LAPAROSCOPIC NEPHRECTOMY Right 03/20/2015   Procedure: LAPAROSCOPIC NEPHRECTOMY;  Surgeon: Raynelle Bring, MD;  Location: [facility];  Service: Urology;  Laterality: Right;  . PERIPHERAL VASCULAR CATHETERIZATION N/A 07/28/2014   Procedure: Aortic Arch Angiography;  Surgeon: [name], MD;  Location: [facility];   Service: Cardiovascular;  Laterality: N/A;  . RENAL MASS EXCISION    . SPINE SURGERY      Her Family History Is Significant For: Family History  Problem Relation Age of Onset  . Hypertension Mother   . Coronary artery disease Other   . Breast cancer Cousin     Her Social History Is Significant For: Social History   Socioeconomic History  . Marital status: Widowed    Spouse name: Not on file  . Number of children: 2  . Years of education: Not on file  . Highest education level: Not on file  Occupational History  . Not on file  Tobacco Use  . Smoking status: Never Smoker  . Smokeless tobacco: Never Used  Vaping Use  . Vaping Use: Never used  Substance and Sexual Activity  . Alcohol use: No  . Drug use: No    Frequency: 1.0 times per week  . Sexual activity: Never  Other Topics Concern  . Not on file  Social History Narrative  . Not on file   Social Determinants of Health   Financial Resource Strain:   . Difficulty of Paying Living Expenses: Not on file  Food Insecurity:   . Worried About Charity fundraiser in the Last Year: Not on file  . Ran Out of Food in the Last Year: Not on file  Transportation Needs:   . Lack of Transportation (Medical): Not on file  . Lack of Transportation (Non-Medical): Not on file  Physical Activity:   . Days of Exercise per Week: Not on file  . Minutes of Exercise per Session: Not on file  Stress:   . Feeling of Stress : Not on file  Social Connections:   . Frequency of Communication with Friends and Family: Not on file  . Frequency of Social Gatherings with Friends and Family: Not on file  . Attends Religious Services: Not on file  . Active Member of Clubs or Organizations: Not on file  . Attends Archivist Meetings: Not on file  . Marital Status: Not on file    Her Allergies Are:  Allergies  Allergen Reactions  . Sulfa Antibiotics Itching  :   Her Current Medications Are:  Outpatient Encounter Medications as  of 11/09/2019  Medication Sig  . albuterol (PROVENTIL,VENTOLIN) 90  MCG/ACT inhaler Inhale 2 puffs into the lungs every 4 (four) hours as needed for wheezing or shortness of breath.   Marland Kitchen amLODipine (NORVASC) 5 MG tablet TAKE 1 TABLET BY MOUTH EVERY DAY  . aspirin EC 81 MG tablet Take 81 mg by mouth at bedtime.  Marland Kitchen atorvastatin (LIPITOR) 10 MG tablet Take 10 mg by mouth every morning.   . cholecalciferol (VITAMIN D) 1000 units tablet Take 1,000 Units by mouth daily.  Marland Kitchen donepezil (ARICEPT) 5 MG tablet Take 5 mg by mouth at bedtime.   . gabapentin (NEURONTIN) 300 MG capsule Take 1 capsule (300 mg total) by mouth at bedtime.  . Insulin Degludec (TRESIBA FLEXTOUCH) 200 UNIT/ML SOPN Inject 78 Units into the skin daily before breakfast.   . lidocaine (LIDODERM) 5 % Place 1 patch onto the skin daily. Remove & Discard patch within 12 hours or as directed by MD  . metoprolol succinate (TOPROL-XL) 100 MG 24 hr tablet TAKE 1 TABLET BY MOUTH EVERY DAY  . mirabegron ER (MYRBETRIQ) 50 MG TB24 tablet Take 50 mg by mouth daily.  . Multiple Vitamins-Minerals (CENTRUM SILVER ADULT 50+ PO) Take 1 tablet by mouth daily.  . nitroGLYCERIN (NITROSTAT) 0.4 MG SL tablet Place 0.4 mg under the tongue every 5 (five) minutes as needed for chest pain.  . pantoprazole (PROTONIX) 40 MG tablet Take 40 mg by mouth daily as needed (heartburn).   . TRADJENTA 5 MG TABS tablet Take 5 mg by mouth daily.   No facility-administered encounter medications on file as of 11/09/2019.  :  Review of Systems:  Out of a complete 14 point review of systems, all are reviewed and negative with the exception of these symptoms as listed below:      Review of Systems  Neurological:       Here for f/u on memory. Pt reports memory has been stable since last visit.     Objective:  Neurological Exam  Physical Exam Physical Examination:   Vitals:   11/09/19 1046  BP: 132/76  Pulse: 60  SpO2: 97%   General Examination: The patient is a  very pleasant 79 y.o. female in no acute distress. She appears well-developed and well-nourished and well groomed.   HEENT:Normocephalic, atraumatic, pupils are equal, round and reactive to light and accommodation. Extraocular tracking is good without limitation to gaze excursion or nystagmus noted. Normal smooth pursuit is noted. Hearing is grossly intact. Face is symmetric with normal facial animation and normal facial sensation. Speech is clear with no dysarthria noted. There is no hypophonia. There is no lip, neck/head, jaw or voice tremor. Neck is supple with full range of passive and active motion. There are no carotid bruits on auscultation. Oropharynx exam reveals: mildmouth dryness, adequatedental hygiene andmoderateairway crowding, due to redundant soft palate, tonsils are 1+, Mallampati is class II.  Chest:Clear to auscultation without wheezing, rhonchi or crackles noted.  Heart:S1+S2+0, regular and normal without murmurs, rubs or gallops noted.   Abdomen:Soft, non-tender and non-distended with normal bowel sounds appreciated on auscultation.  Extremities:There istrace edema around the ankles, left slightly worse than right.  Skin: Warm and dry without trophic changes noted. There are novaricose veins.  Musculoskeletal: exam reveals no obvious joint deformities, tenderness or joint swelling or erythema.   Neurologically:  Mental status: The patient is awake, alert and oriented in all 4 spheres.Herimmediate and remote memory, attention, language skills and fund of knowledge are mildly impaired.There is no evidence of aphasia, agnosia, apraxia or anomia. Speech is clear  with normal prosody and enunciation. Thought process is linear. Mood is normaland affect is normal.  On5/22/2019: MMSE: 27/30, CDT: 4/4, AFT: 9/min.  On1/16/2020: MMSE: 29/30, CDT: 4/4, AFT: 11/min.  On 09/07/2018: MMSE: 28/30, CDT: 4/4, AFT: 13/min.  On 11/09/2019: MMSE: 27/30, CDT: 4/4,  AFT: 10/min.  Cranial nerves II - XII are as described above under HEENT exam.  Motor exam: Normal bulk, strength and tone is noted. There is no tremor or rebound. Romberg isnot tested for safety reasons.Fine motor skills and coordination:grosslyintact.  Cerebellar testing: No dysmetria or intention tremor. There is no truncal or gait ataxia.  Sensory exam: intact to light touch in the upper and lower extremities.  Gait, station and balance:Shestands without significant difficulty, no limp, walking and posture are age-appropriate.   Assessmentand Plan:   In summary,Stefanie S Turneris a very pleasant 74 year oldfemalewith an underlying complex medical history of diabetes, asthma, reflux disease, hypertension, hyperlipidemia, kidney cancer with status post left nephrectomy, sleep apnea, vertigo, chronic kidney disease, arthritis and obesity, whopresents for follow-up consultation of her memory loss and her sleep apnea.  She has been on low-dose Aricept.  Her memory scores have declined ever so slightly over time.  She would be willing to increase the Aricept to 10 mg.  She currently has a 5 mg strength prescription, she is advised to take 2 pills each bedtime for the next 2 weeks and if tolerated she can fill the next prescription at the 10 mg strength, 1 pill once daily.  She is agreeable to this.  She is typically compliant with her CPAP but has had trouble turning the machine on.  She needs to get it looked at.  She is advised to get in touch with her DME company and also requested a recheck on the machine or a replacement through her DME company.  I placed an order for this.  She is advised to continue to pursue healthy lifestyle, stay active mentally and physically.  She has had some mood irritability and frustration, less tolerance or less patients.  Feels like she never used to be like this, she used to be very patient.  She is very active, is involved in the choir.  She is encouraged to  talk to her primary care physician, from my end of things, I think we can monitor her symptoms, she does not have any telltale symptoms of an underlying mood disorder.  I would like to see how she does on the increased dose of Aricept.  We will help her get back on track with the CPAP as she has always been compliant with it.  She is advised to follow-up with one of our nurse practitioners in 6 months, sooner if needed.  I answered all her questions today and she was in agreement.   I spent 30 minutes in total face-to-face time and in reviewing records during pre-charting, more than 50% of which was spent in counseling and coordination of care, reviewing test results, reviewing medications and treatment regimen and/or in discussing or reviewing the diagnosis of OSA, memory loss, the prognosis and treatment options. Pertinent laboratory and imaging test results that were available during this visit with the patient were reviewed by me and considered in my medical decision making (see chart for details).

## 2019-11-09 NOTE — Patient Instructions (Signed)
I have requested that your DME company, [employer] look at your machine to repair it or replace it.  Please also get in touch with them because you will likely have to take in your current machine to get it looked at.  As discussed, we will increase your Aricept/donepezil from 5 mg daily in the evening to 10 mg daily in the evening.  For starters, you can increase your current prescription which is 5 mg strength to 2 pills at night for the first week or 2 and if tolerated, you can fill the prescription for the 10 mg strength which I placed today.    Please follow-up to see Ward Givens, nurse practitioner in 6 months.

## 2019-11-23 ENCOUNTER — Encounter (INDEPENDENT_AMBULATORY_CARE_PROVIDER_SITE_OTHER): Payer: Medicare PPO | Admitting: Ophthalmology

## 2019-11-29 DIAGNOSIS — K580 Irritable bowel syndrome with diarrhea: Secondary | ICD-10-CM | POA: Diagnosis not present

## 2019-11-29 DIAGNOSIS — N184 Chronic kidney disease, stage 4 (severe): Secondary | ICD-10-CM | POA: Diagnosis not present

## 2019-11-29 DIAGNOSIS — I10 Essential (primary) hypertension: Secondary | ICD-10-CM | POA: Diagnosis not present

## 2019-11-29 DIAGNOSIS — E1122 Type 2 diabetes mellitus with diabetic chronic kidney disease: Secondary | ICD-10-CM | POA: Diagnosis not present

## 2019-12-18 DIAGNOSIS — Z794 Long term (current) use of insulin: Secondary | ICD-10-CM | POA: Diagnosis not present

## 2019-12-18 DIAGNOSIS — E1122 Type 2 diabetes mellitus with diabetic chronic kidney disease: Secondary | ICD-10-CM | POA: Diagnosis not present

## 2019-12-18 DIAGNOSIS — N184 Chronic kidney disease, stage 4 (severe): Secondary | ICD-10-CM | POA: Diagnosis not present

## 2019-12-18 DIAGNOSIS — I129 Hypertensive chronic kidney disease with stage 1 through stage 4 chronic kidney disease, or unspecified chronic kidney disease: Secondary | ICD-10-CM | POA: Diagnosis not present

## 2019-12-22 ENCOUNTER — Other Ambulatory Visit: Payer: Self-pay | Admitting: Cardiology

## 2019-12-22 DIAGNOSIS — I10 Essential (primary) hypertension: Secondary | ICD-10-CM

## 2020-01-02 IMAGING — CR DG HIP (WITH OR WITHOUT PELVIS) 2-3V*R*
3 series · 3 of 3 positions shown · non-contrast
Comparison: None.

CLINICAL DATA: Hip pain.

EXAM:
DG HIP (WITH OR WITHOUT PELVIS) 2-3V RIGHT

[t pelvis ap]
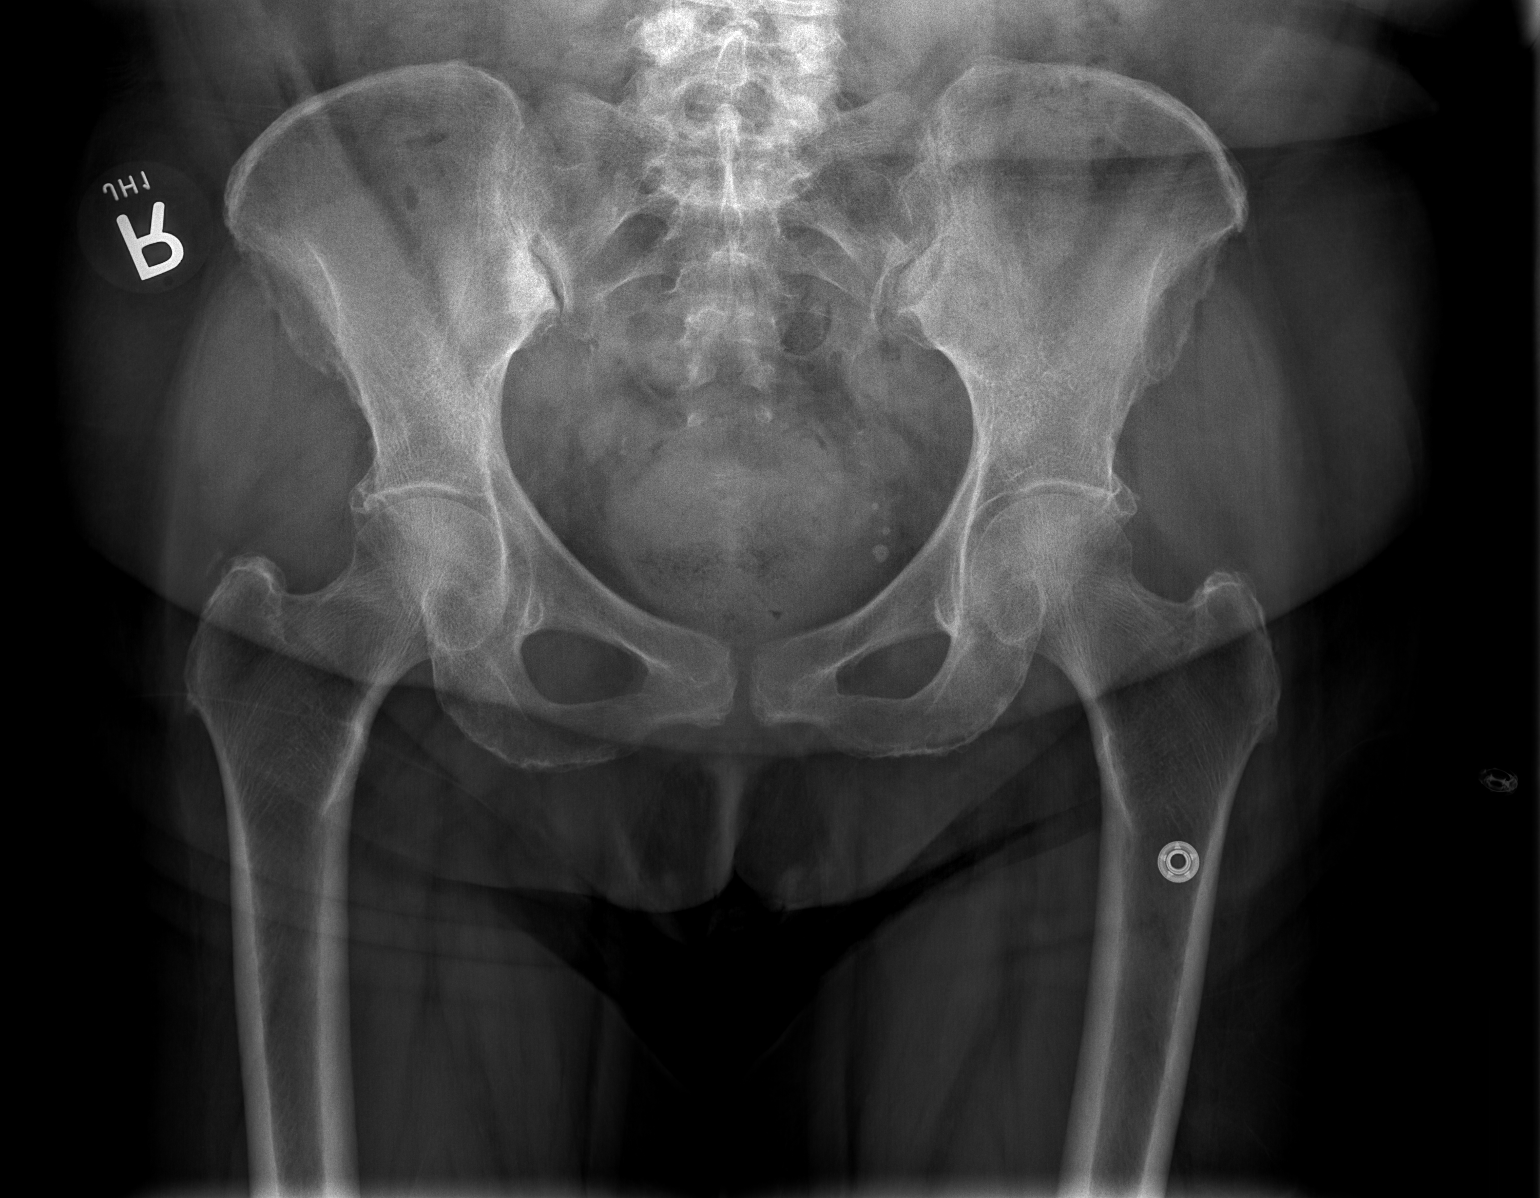

[t hip ap right]
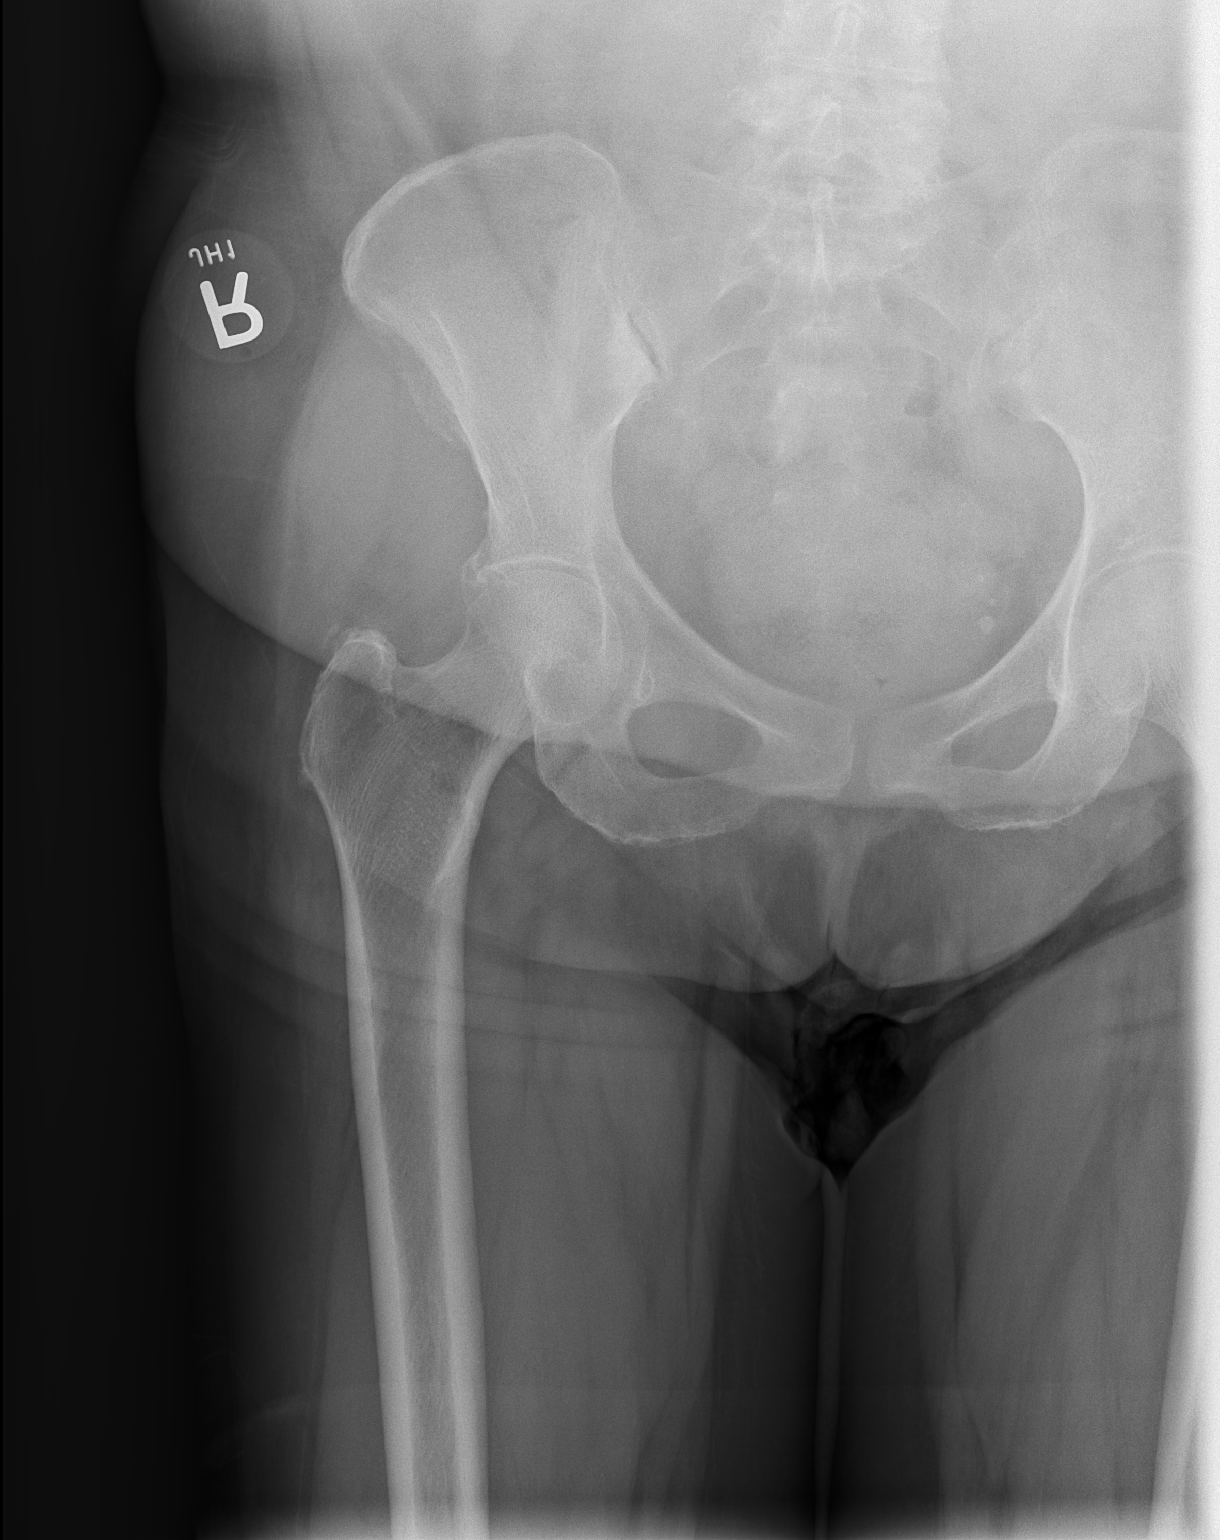

[t hip frog leg right]
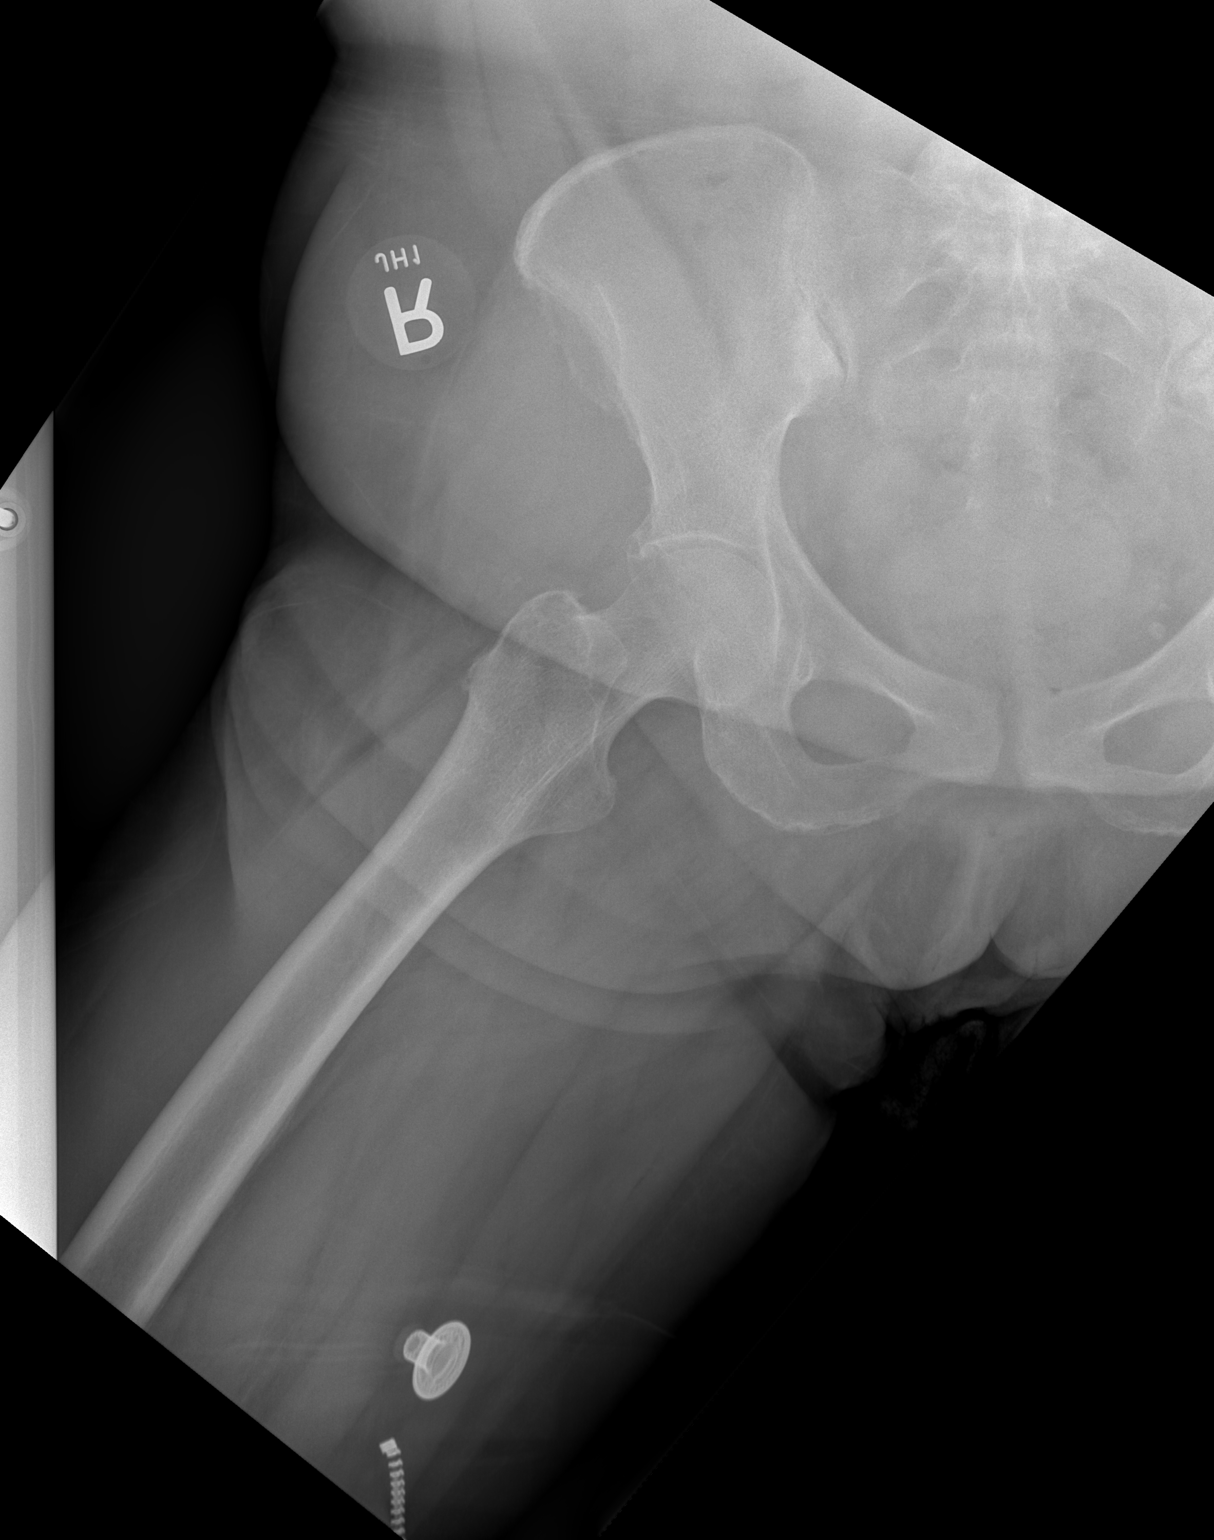

[3 of 3 positions shown; findings below may reference images not displayed]

FINDINGS: There is no evidence of hip fracture or dislocation. There is no
evidence of arthropathy or other focal bone abnormality.
IMPRESSION: Negative.

## 2020-01-03 IMAGING — CR DG LUMBAR SPINE COMPLETE 4+V
5 series · 5 of 5 positions shown · non-contrast
Comparison: CT abdomen and pelvis 02/12/2015

CLINICAL DATA: Low back and hip pain for 3 days.  No known injury.

EXAM:
LUMBAR SPINE - COMPLETE 4+ VIEW

[t lumbar spine obl (1 of 3)]
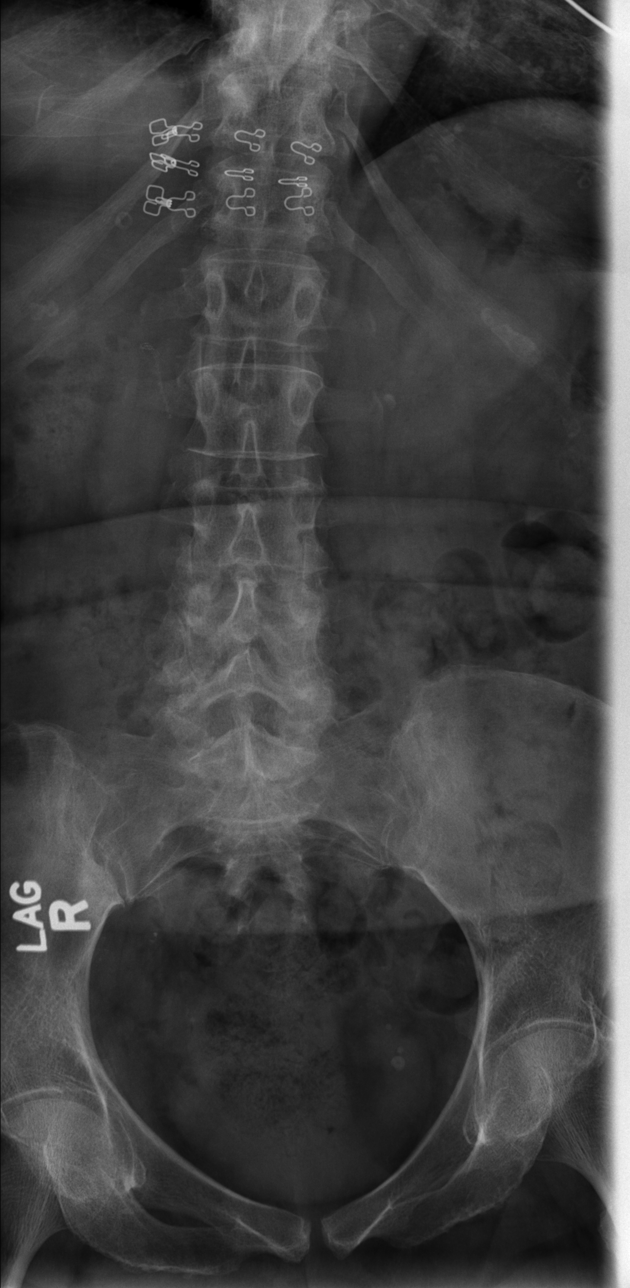

[t lumbar spine obl (2 of 3)]
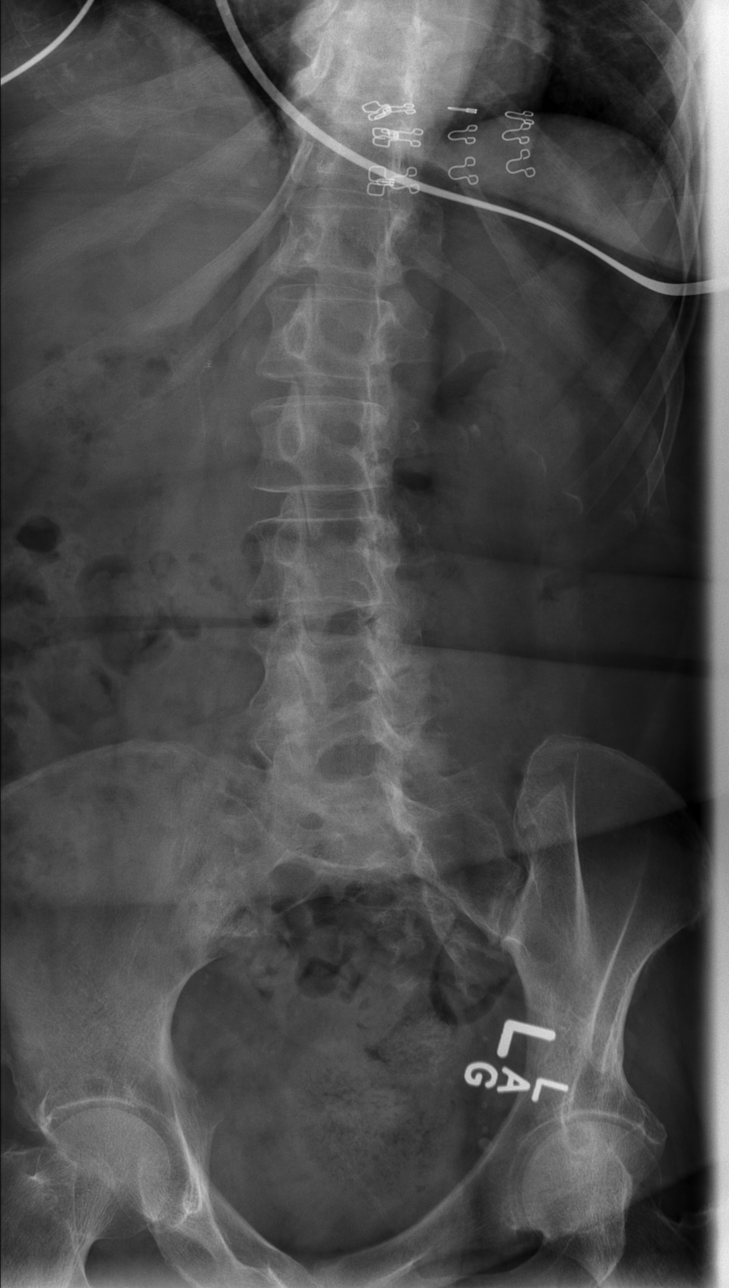

[t lumbar spine obl (3 of 3)]
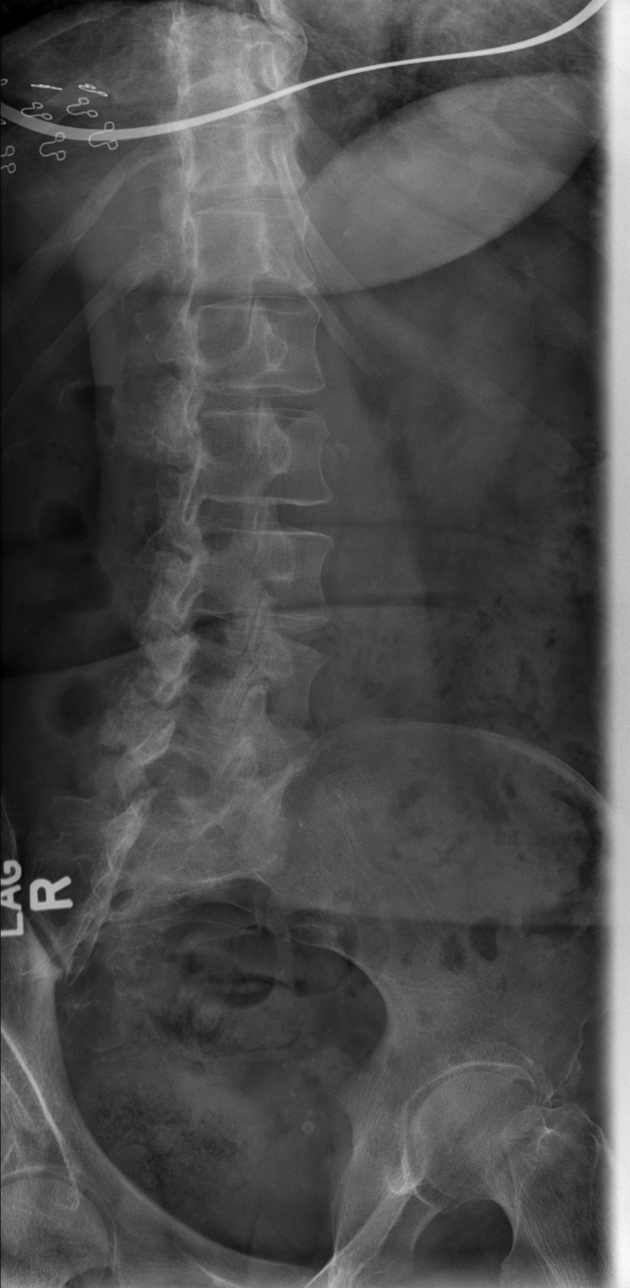

[t lumbar spine lat]
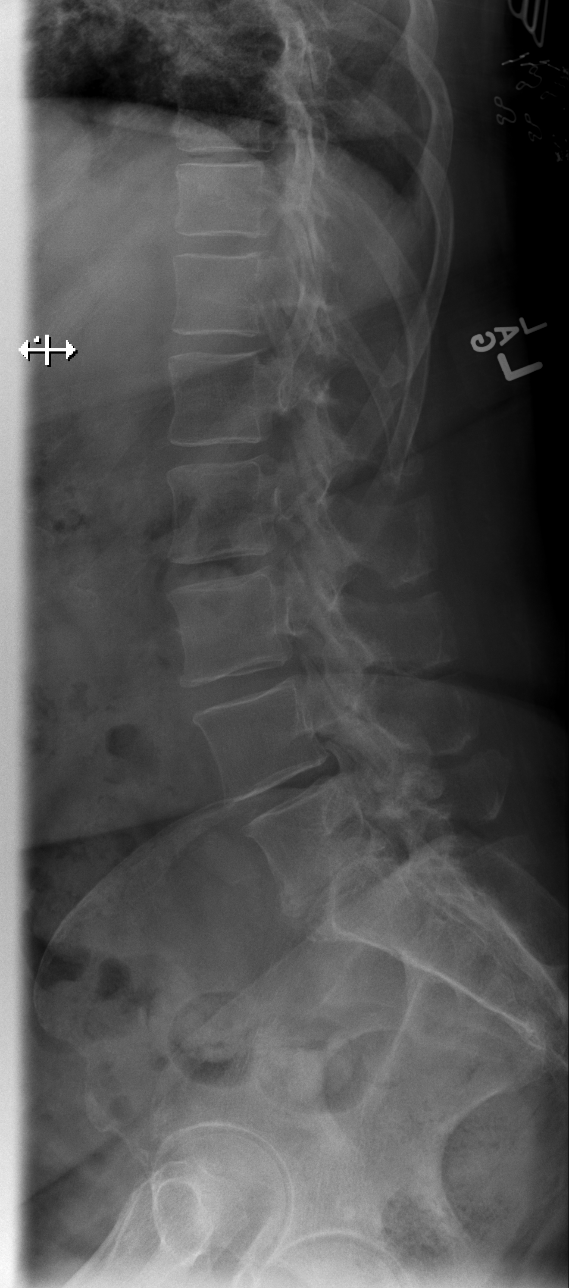

[t lumbar l-5 s-1 spot]
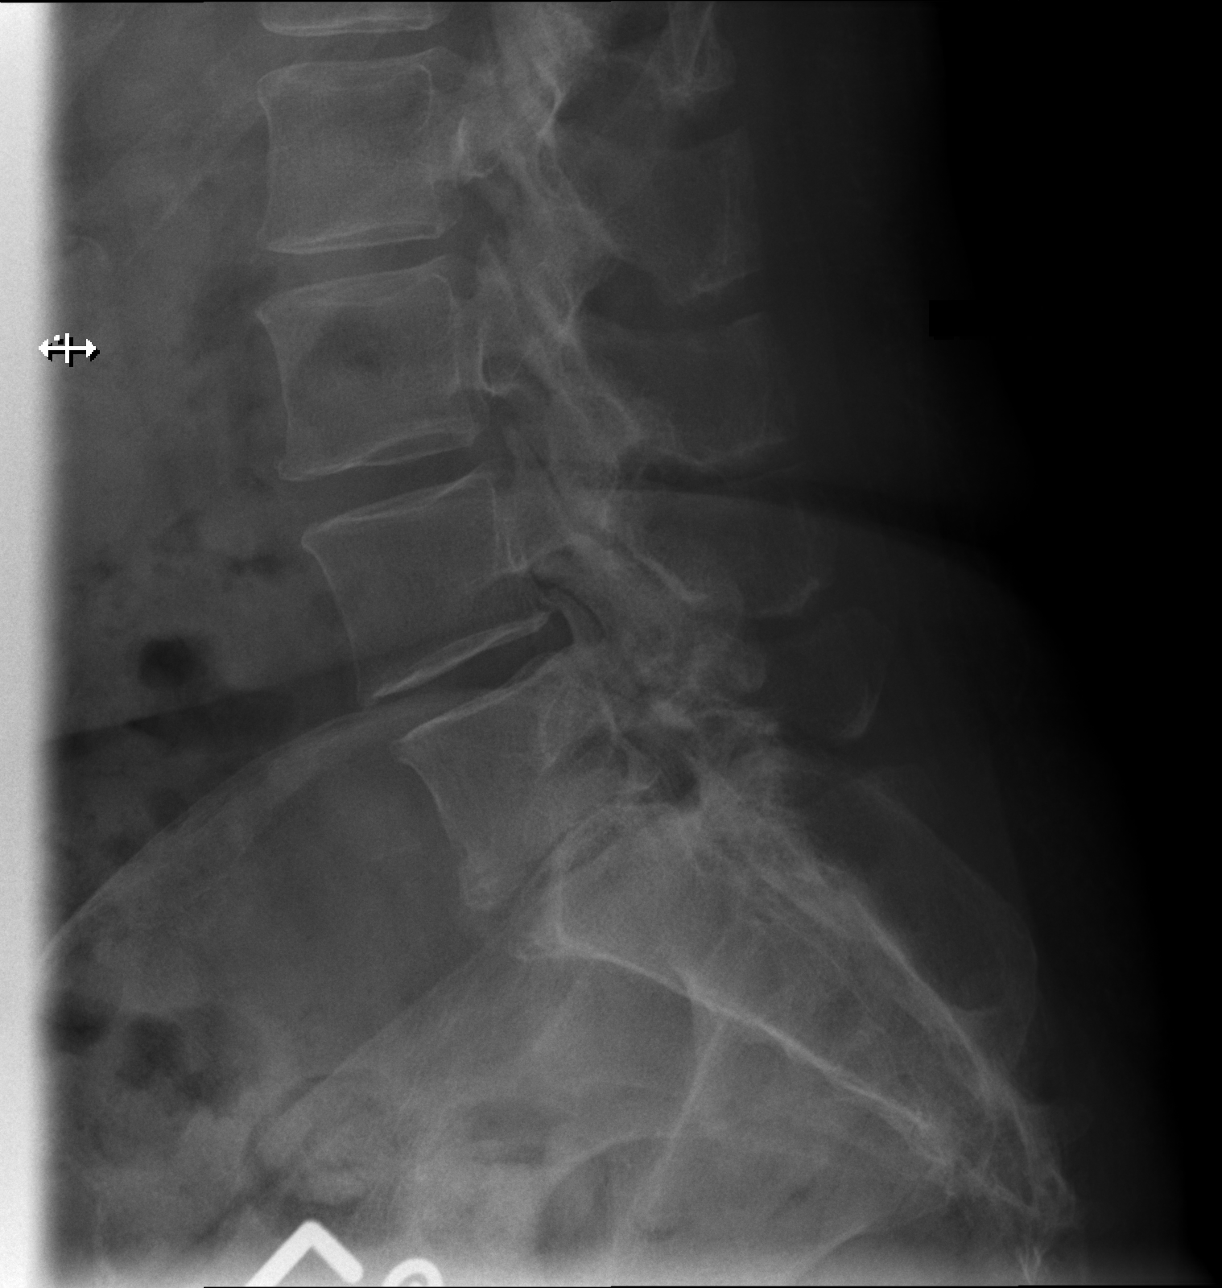

[5 of 5 positions shown; findings below may reference images not displayed]

FINDINGS: Five lumbar type vertebral bodies. No vertebral compression
deformities. No focal bone lesion or bone destruction. Degenerative
changes in the lumbar spine most severe at L5-S1. No significant
change since previous CT study. Visualized sacrum appears intact.
Calcifications demonstrated in the upper abdomen bilaterally may
represent renal stones and/ or gallstones.
IMPRESSION: Normal alignment of the lumbar spine. Degenerative changes. No acute
displaced fractures identified. Calcifications in the upper abdomen
bilaterally may represent renal and/ or gallbladder stones.

## 2020-01-04 ENCOUNTER — Other Ambulatory Visit: Payer: Self-pay | Admitting: Cardiology

## 2020-01-04 DIAGNOSIS — I10 Essential (primary) hypertension: Secondary | ICD-10-CM

## 2020-01-18 DIAGNOSIS — Z794 Long term (current) use of insulin: Secondary | ICD-10-CM | POA: Diagnosis not present

## 2020-01-18 DIAGNOSIS — I129 Hypertensive chronic kidney disease with stage 1 through stage 4 chronic kidney disease, or unspecified chronic kidney disease: Secondary | ICD-10-CM | POA: Diagnosis not present

## 2020-01-18 DIAGNOSIS — N184 Chronic kidney disease, stage 4 (severe): Secondary | ICD-10-CM | POA: Diagnosis not present

## 2020-01-18 DIAGNOSIS — E1122 Type 2 diabetes mellitus with diabetic chronic kidney disease: Secondary | ICD-10-CM | POA: Diagnosis not present

## 2020-01-26 DIAGNOSIS — I129 Hypertensive chronic kidney disease with stage 1 through stage 4 chronic kidney disease, or unspecified chronic kidney disease: Secondary | ICD-10-CM | POA: Diagnosis not present

## 2020-01-26 DIAGNOSIS — D631 Anemia in chronic kidney disease: Secondary | ICD-10-CM | POA: Diagnosis not present

## 2020-01-26 DIAGNOSIS — E1122 Type 2 diabetes mellitus with diabetic chronic kidney disease: Secondary | ICD-10-CM | POA: Diagnosis not present

## 2020-01-26 DIAGNOSIS — N2581 Secondary hyperparathyroidism of renal origin: Secondary | ICD-10-CM | POA: Diagnosis not present

## 2020-01-26 DIAGNOSIS — N189 Chronic kidney disease, unspecified: Secondary | ICD-10-CM | POA: Diagnosis not present

## 2020-01-26 DIAGNOSIS — N184 Chronic kidney disease, stage 4 (severe): Secondary | ICD-10-CM | POA: Diagnosis not present

## 2020-01-26 DIAGNOSIS — C642 Malignant neoplasm of left kidney, except renal pelvis: Secondary | ICD-10-CM | POA: Diagnosis not present

## 2020-01-26 DIAGNOSIS — C641 Malignant neoplasm of right kidney, except renal pelvis: Secondary | ICD-10-CM | POA: Diagnosis not present

## 2020-01-26 DIAGNOSIS — M4800 Spinal stenosis, site unspecified: Secondary | ICD-10-CM | POA: Diagnosis not present

## 2020-01-26 DIAGNOSIS — N179 Acute kidney failure, unspecified: Secondary | ICD-10-CM | POA: Diagnosis not present

## 2020-01-26 DIAGNOSIS — E119 Type 2 diabetes mellitus without complications: Secondary | ICD-10-CM | POA: Diagnosis not present

## 2020-02-15 ENCOUNTER — Other Ambulatory Visit: Payer: Self-pay | Admitting: Urology

## 2020-02-15 ENCOUNTER — Other Ambulatory Visit (HOSPITAL_COMMUNITY): Payer: Self-pay | Admitting: Urology

## 2020-02-15 DIAGNOSIS — Z85528 Personal history of other malignant neoplasm of kidney: Secondary | ICD-10-CM

## 2020-02-17 ENCOUNTER — Other Ambulatory Visit: Payer: Self-pay

## 2020-02-17 ENCOUNTER — Encounter (HOSPITAL_COMMUNITY): Payer: Self-pay

## 2020-02-17 ENCOUNTER — Emergency Department (HOSPITAL_COMMUNITY): Payer: Medicare PPO

## 2020-02-17 DIAGNOSIS — R1031 Right lower quadrant pain: Secondary | ICD-10-CM | POA: Diagnosis not present

## 2020-02-17 DIAGNOSIS — Z79899 Other long term (current) drug therapy: Secondary | ICD-10-CM | POA: Insufficient documentation

## 2020-02-17 DIAGNOSIS — Z794 Long term (current) use of insulin: Secondary | ICD-10-CM | POA: Insufficient documentation

## 2020-02-17 DIAGNOSIS — R109 Unspecified abdominal pain: Secondary | ICD-10-CM | POA: Diagnosis present

## 2020-02-17 DIAGNOSIS — N189 Chronic kidney disease, unspecified: Secondary | ICD-10-CM | POA: Diagnosis not present

## 2020-02-17 DIAGNOSIS — Z90710 Acquired absence of both cervix and uterus: Secondary | ICD-10-CM | POA: Diagnosis not present

## 2020-02-17 DIAGNOSIS — Z7982 Long term (current) use of aspirin: Secondary | ICD-10-CM | POA: Insufficient documentation

## 2020-02-17 DIAGNOSIS — E1122 Type 2 diabetes mellitus with diabetic chronic kidney disease: Secondary | ICD-10-CM | POA: Insufficient documentation

## 2020-02-17 DIAGNOSIS — J45909 Unspecified asthma, uncomplicated: Secondary | ICD-10-CM | POA: Diagnosis not present

## 2020-02-17 DIAGNOSIS — I129 Hypertensive chronic kidney disease with stage 1 through stage 4 chronic kidney disease, or unspecified chronic kidney disease: Secondary | ICD-10-CM | POA: Insufficient documentation

## 2020-02-17 DIAGNOSIS — R300 Dysuria: Secondary | ICD-10-CM | POA: Diagnosis not present

## 2020-02-17 DIAGNOSIS — R1084 Generalized abdominal pain: Secondary | ICD-10-CM | POA: Diagnosis not present

## 2020-02-17 DIAGNOSIS — I700 Atherosclerosis of aorta: Secondary | ICD-10-CM | POA: Diagnosis not present

## 2020-02-17 DIAGNOSIS — Z905 Acquired absence of kidney: Secondary | ICD-10-CM | POA: Diagnosis not present

## 2020-02-17 DIAGNOSIS — K5790 Diverticulosis of intestine, part unspecified, without perforation or abscess without bleeding: Secondary | ICD-10-CM | POA: Diagnosis not present

## 2020-02-17 LAB — URINALYSIS, ROUTINE W REFLEX MICROSCOPIC
Bilirubin Urine: NEGATIVE
Glucose, UA: NEGATIVE mg/dL
Hgb urine dipstick: NEGATIVE
Ketones, ur: NEGATIVE mg/dL
Leukocytes,Ua: NEGATIVE
Nitrite: NEGATIVE
Protein, ur: NEGATIVE mg/dL
Specific Gravity, Urine: 1.009 (ref 1.005–1.030)
pH: 5 (ref 5.0–8.0)

## 2020-02-17 LAB — BASIC METABOLIC PANEL
Anion gap: 10 (ref 5–15)
BUN: 22 mg/dL (ref 8–23)
CO2: 24 mmol/L (ref 22–32)
Calcium: 10.1 mg/dL (ref 8.9–10.3)
Chloride: 106 mmol/L (ref 98–111)
Creatinine, Ser: 1.66 mg/dL — ABNORMAL HIGH (ref 0.44–1.00)
GFR, Estimated: 31 mL/min — ABNORMAL LOW (ref >60)
Glucose, Bld: 95 mg/dL (ref 70–99)
Potassium: 4.1 mmol/L (ref 3.5–5.1)
Sodium: 140 mmol/L (ref 135–145)

## 2020-02-17 LAB — CBC
HCT: 42.8 % (ref 36.0–46.0)
Hemoglobin: 13.9 g/dL (ref 12.0–15.0)
MCH: 29.8 pg (ref 26.0–34.0)
MCHC: 32.5 g/dL (ref 30.0–36.0)
MCV: 91.8 fL (ref 80.0–100.0)
Platelets: 229 10*3/uL (ref 150–400)
RBC: 4.66 MIL/uL (ref 3.87–5.11)
RDW: 13.9 % (ref 11.5–15.5)
WBC: 6.5 10*3/uL (ref 4.0–10.5)
nRBC: 0 % (ref 0.0–0.2)

## 2020-02-17 NOTE — ED Triage Notes (Signed)
Patient c/o right flank pain and dysuria x 1 week. Patient states she has had urinary frequency also.

## 2020-02-18 ENCOUNTER — Emergency Department (HOSPITAL_COMMUNITY)
Admission: EM | Admit: 2020-02-18 | Discharge: 2020-02-18 | Disposition: A | Discharge status: Home / Self Care | Payer: Medicare PPO | Attending: Emergency Medicine | Admitting: Emergency Medicine

## 2020-02-18 DIAGNOSIS — Z794 Long term (current) use of insulin: Secondary | ICD-10-CM | POA: Diagnosis not present

## 2020-02-18 DIAGNOSIS — R300 Dysuria: Secondary | ICD-10-CM

## 2020-02-18 DIAGNOSIS — R109 Unspecified abdominal pain: Secondary | ICD-10-CM

## 2020-02-18 DIAGNOSIS — I129 Hypertensive chronic kidney disease with stage 1 through stage 4 chronic kidney disease, or unspecified chronic kidney disease: Secondary | ICD-10-CM | POA: Diagnosis not present

## 2020-02-18 DIAGNOSIS — E1122 Type 2 diabetes mellitus with diabetic chronic kidney disease: Secondary | ICD-10-CM | POA: Diagnosis not present

## 2020-02-18 DIAGNOSIS — N184 Chronic kidney disease, stage 4 (severe): Secondary | ICD-10-CM | POA: Diagnosis not present

## 2020-02-18 LAB — HEPATIC FUNCTION PANEL
ALT: 22 U/L (ref 0–44)
AST: 19 U/L (ref 15–41)
Albumin: 4.1 g/dL (ref 3.5–5.0)
Alkaline Phosphatase: 64 U/L (ref 38–126)
Bilirubin, Direct: 0.1 mg/dL (ref 0.0–0.2)
Indirect Bilirubin: 0.2 mg/dL — ABNORMAL LOW (ref 0.3–0.9)
Total Bilirubin: 0.3 mg/dL (ref 0.3–1.2)
Total Protein: 7.8 g/dL (ref 6.5–8.1)

## 2020-02-18 LAB — GLUCOSE, CAPILLARY: Glucose-Capillary: 147 mg/dL — ABNORMAL HIGH (ref 70–99)

## 2020-02-18 MED ORDER — METHOCARBAMOL 500 MG PO TABS
500.0000 mg | ORAL_TABLET | Freq: Two times a day (BID) | ORAL | 0 refills | Status: DC
Start: 2020-02-18 — End: 2021-06-18

## 2020-02-18 MED ORDER — ACETAMINOPHEN 325 MG PO TABS
650.0000 mg | ORAL_TABLET | Freq: Once | ORAL | Status: AC
  Administered 2020-02-18: 650 mg via ORAL
  Filled 2020-02-18: qty 2

## 2020-02-18 MED ORDER — ACETAMINOPHEN ER 650 MG PO TBCR
650.0000 mg | EXTENDED_RELEASE_TABLET | Freq: Three times a day (TID) | ORAL | 0 refills | Status: DC | PRN
Start: 2020-02-18 — End: 2023-12-18

## 2020-02-18 MED ORDER — LIDOCAINE 5 % EX PTCH
1.0000 | MEDICATED_PATCH | CUTANEOUS | Status: DC
  Administered 2020-02-18: 1 via TRANSDERMAL
  Filled 2020-02-18: qty 1

## 2020-02-18 NOTE — ED Provider Notes (Signed)
[facility] DEPT Provider Note   CSN: [id number] Arrival date & time: 02/17/20  1718     History Chief Complaint  Patient presents with  . Flank Pain  . Dysuria  . Urinary Frequency    [name] is a 79 y.o. female with a past medical history significant for asthma, diabetes, GERD, hyperlipidemia, hypertension, and history of renal cell adenocarcinoma who presents to the ED due to right flank/back pain x1 week.  Patient states pain is constant, but worse with palpation and movement.  Flank/back pain associated with dysuria and increased urinary frequency.  Patient denies fever and chills.  Denies any direct injury.  Patient notes she is concerned that her cancer has returned due to the location of pain.  Denies overlying rash. Denies abdominal pain, nausea, vomiting, and diarrhea. No treatment prior to arrival.  No history of previous kidney stones.  Patient had a previous right nephrectomy. Denies chest pain and shortness of breath. Denies history of blood clots, recent surgeries, recent long immobilizations, and lower extremity edema.   History obtained from patient and past medical records. No interpreter used during encounter.      Past Medical History:  Diagnosis Date  . ACS (acute coronary syndrome) ([place]) 12/27/2010  . AKI (acute kidney injury) ([place]) 03/25/2018  . Anginal pain ([place])   . Arthritis   . Asthma   . Diabetes mellitus    Type 1  . Dysrhythmia    "irregular heart beat"  . GERD (gastroesophageal reflux disease)   . History of urinary tract infection   . Hypercholesteremia   . Hypertension   . left renal ca dx'd 2012 (?)   surg only, left kidney  . Nocturia   . Reflux   . Renal failure (ARF), acute on chronic (HCC)   . Shortness of breath dyspnea    pt denies; states can climb stairs w/o difficulty   . Sleep apnea    does not use c-pap machine  . Tingling in extremities    legs bilat  . Tinnitus   . Urinary frequency   .  Urinary incontinence   . Vertigo    occurs when lying flat     Patient Active Problem List   Diagnosis Date Noted  . Pain due to onychomycosis of toenails of both feet 09/01/2018  . Left shoulder pain   . Limited joint range of motion (ROM)   . Chest pain 10/23/2015  . Diabetes mellitus type 2 in obese ([place]) 10/23/2015  . Chest pain with high risk for cardiac etiology 07/28/2014  . Diabetes mellitus 12/27/2010  . Hypertension 12/27/2010  . Hyperlipidemia 12/27/2010  . GERD (gastroesophageal reflux disease) 12/27/2010  . Renal cell adenocarcinoma ([place]) 12/27/2010  . Arthritis 12/27/2010  . Cervical stenosis of spine 12/27/2010  . Carpal tunnel syndrome 12/27/2010  . Asthma 12/27/2010    Past Surgical History:  Procedure Laterality Date  . ABDOMINAL HYSTERECTOMY    . APPENDECTOMY    . CARDIAC CATHETERIZATION N/A 07/28/2014   Procedure: Left Heart Cath and Coronary Angiography;  Surgeon: [name], MD;  Location: [facility];  Service: Cardiovascular;  Laterality: N/A;  . CYSTOSCOPY WITH RETROGRADE PYELOGRAM, URETEROSCOPY AND STENT PLACEMENT Right 02/09/2015   Procedure: CYSTOSCOPY WITH RETROGRADE PYELOGRAM AND URETEROSCOPY ;  Surgeon: Raynelle Bring, MD;  Location: [facility];  Service: Urology;  Laterality: Right;  . LAPAROSCOPIC NEPHRECTOMY Right 03/20/2015   Procedure: LAPAROSCOPIC NEPHRECTOMY;  Surgeon: Raynelle Bring, MD;  Location: [facility];  Service: Urology;  Laterality: Right;  . PERIPHERAL VASCULAR CATHETERIZATION N/A 07/28/2014   Procedure: Aortic Arch Angiography;  Surgeon: [name], MD;  Location: [facility];  Service: Cardiovascular;  Laterality: N/A;  . RENAL MASS EXCISION    . SPINE SURGERY       OB History   No obstetric history on file.     Family History  Problem Relation Age of Onset  . Hypertension Mother   . Coronary artery disease Other   . Breast cancer Cousin     Social History   Tobacco Use  . Smoking status: Never Smoker  . Smokeless  tobacco: Never Used  Vaping Use  . Vaping Use: Never used  Substance Use Topics  . Alcohol use: No  . Drug use: No    Home Medications Prior to Admission medications   Medication Sig Start Date End Date Taking? Authorizing Provider  acetaminophen (TYLENOL 8 HOUR) 650 MG CR tablet Take 1 tablet (650 mg total) by mouth every 8 (eight) hours as needed for pain. 02/18/20  Yes Emaley Applin, Druscilla Brownie, PA-C  methocarbamol (ROBAXIN) 500 MG tablet Take 1 tablet (500 mg total) by mouth 2 (two) times daily. 02/18/20  Yes Karman Veney C, PA-C  albuterol (PROVENTIL,VENTOLIN) 90 MCG/ACT inhaler Inhale 2 puffs into the lungs every 4 (four) hours as needed for wheezing or shortness of breath.     [provider]  amLODipine (NORVASC) 5 MG tablet TAKE 1 TABLET BY MOUTH EVERY DAY 06/28/19   Patwardhan, Manish J, MD  aspirin EC 81 MG tablet Take 81 mg by mouth at bedtime.    [provider]  atorvastatin (LIPITOR) 10 MG tablet Take 10 mg by mouth every morning.     [provider]  cholecalciferol (VITAMIN D) 1000 units tablet Take 1,000 Units by mouth daily.    [provider]  donepezil (ARICEPT) 10 MG tablet Take 1 tablet (10 mg total) by mouth at bedtime. 11/09/19   Star Age, MD  gabapentin (NEURONTIN) 300 MG capsule Take 1 capsule (300 mg total) by mouth at bedtime. 07/16/16   Wallene Huh, DPM  Insulin Degludec (TRESIBA FLEXTOUCH) 200 UNIT/ML SOPN Inject 78 Units into the skin daily before breakfast.     [provider]  lidocaine (LIDODERM) 5 % Place 1 patch onto the skin daily. Remove & Discard patch within 12 hours or as directed by MD 03/27/18   Reyne Dumas, MD  metoprolol succinate (TOPROL-XL) 100 MG 24 hr tablet TAKE 1 TABLET (100 MG TOTAL) BY MOUTH DAILY. PT NEEDS TO SCHEDULE F/U APPT FOR FURTHER REFILLS. 01/05/20   [name], MD  mirabegron ER (MYRBETRIQ) 50 MG TB24 tablet Take 50 mg by mouth daily.    [provider]  Multiple  Vitamins-Minerals (CENTRUM SILVER ADULT 50+ PO) Take 1 tablet by mouth daily.    [provider]  nitroGLYCERIN (NITROSTAT) 0.4 MG SL tablet Place 0.4 mg under the tongue every 5 (five) minutes as needed for chest pain.    [provider]  pantoprazole (PROTONIX) 40 MG tablet Take 40 mg by mouth daily as needed (heartburn).  03/25/19   [provider]  TRADJENTA 5 MG TABS tablet Take 5 mg by mouth daily. 06/24/19   [provider]    Allergies    Sulfa antibiotics  Review of Systems   Review of Systems  Constitutional: Negative for chills and fever.  Respiratory: Negative for shortness of breath.   Cardiovascular: Negative for  chest pain.  Gastrointestinal: Negative for abdominal pain, diarrhea, nausea and vomiting.  Genitourinary: Positive for dysuria, flank pain and frequency.    Physical Exam Updated Vital Signs BP (!) 152/70 (BP Location: Left Arm)   Pulse (!) 57   Temp 98.2 F (36.8 C) (Oral)   Resp 20   Ht 4\' 11"  (1.499 m)   Wt 67.1 kg   SpO2 99%   BMI 29.89 kg/m   Physical Exam Vitals and nursing note reviewed.  Constitutional:      General: She is not in acute distress.    Appearance: She is not ill-appearing.  HENT:     Head: Normocephalic.  Eyes:     Pupils: Pupils are equal, round, and reactive to light.  Cardiovascular:     Rate and Rhythm: Normal rate and regular rhythm.     Pulses: Normal pulses.     Heart sounds: Normal heart sounds. No murmur heard. No friction rub. No gallop.   Pulmonary:     Effort: Pulmonary effort is normal.     Breath sounds: Normal breath sounds.  Abdominal:     General: Abdomen is flat. Bowel sounds are normal. There is no distension.     Palpations: Abdomen is soft.     Tenderness: There is abdominal tenderness. There is right CVA tenderness. There is no guarding or rebound.     Comments: Diffuse abdominal tenderness. No focal tenderness. No rebound or guarding. Reproducible right flank/mid  back tenderness.  Musculoskeletal:     Cervical back: Neck supple.     Comments: Reproducible tenderness in right flank/mid back area. No crepitus or deformity.   Skin:    Comments: No overlying rash in flank region  Neurological:     General: No focal deficit present.     Mental Status: She is alert.  Psychiatric:        Mood and Affect: Mood normal.        Behavior: Behavior normal.     ED Results / Procedures / Treatments   Labs (all labs ordered are listed, but only abnormal results are displayed) Labs Reviewed  URINALYSIS, ROUTINE W REFLEX MICROSCOPIC - Abnormal; Notable for the following components:      Result Value   Color, Urine STRAW (*)    All other components within normal limits  BASIC METABOLIC PANEL - Abnormal; Notable for the following components:   Creatinine, Ser 1.66 (*)    GFR, Estimated 31 (*)    All other components within normal limits  GLUCOSE, CAPILLARY - Abnormal; Notable for the following components:   Glucose-Capillary 147 (*)    All other components within normal limits  HEPATIC FUNCTION PANEL - Abnormal; Notable for the following components:   Indirect Bilirubin 0.2 (*)    All other components within normal limits  URINE CULTURE  CBC    EKG None  Radiology CT Renal Stone Study  Result Date: 02/17/2020 CLINICAL DATA:  Right-sided flank pain EXAM: CT ABDOMEN AND PELVIS WITHOUT CONTRAST TECHNIQUE: Multidetector CT imaging of the abdomen and pelvis was performed following the standard protocol without IV contrast. COMPARISON:  None. FINDINGS: Lower chest: The visualized heart size within normal limits. No pericardial fluid/thickening. No hiatal hernia. The visualized portions of the lungs are clear. Hepatobiliary: Although limited due to the lack of intravenous contrast, normal in appearance without gross focal abnormality. No evidence of calcified gallstones or biliary ductal dilatation. Pancreas:  Unremarkable.  No surrounding inflammatory  changes. Spleen: Normal in size. Although limited  due to the lack of intravenous contrast, normal in appearance. Adrenals/Urinary Tract: The patient is status post right-sided nephrectomy. No left-sided renal collecting system calculi. Bladder is unremarkable. Stomach/Bowel: The stomach, small bowel, and colon are normal in appearance. No inflammatory changes or obstructive findings. Scattered diverticula are noted. Vascular/Lymphatic: There are no enlarged abdominal or pelvic lymph nodes. Scattered aortic atherosclerotic calcifications are seen without aneurysmal dilatation. Reproductive: The patient is status post hysterectomy. No adnexal masses or collections seen. Other: No evidence of abdominal wall mass or hernia. Musculoskeletal: No acute or significant osseous findings. IMPRESSION: Status post right-sided nephrectomy. No renal or collecting system calculi. No other acute intra-abdominopelvic pathology to explain the patient's symptoms. Aortic Atherosclerosis (ICD10-I70.0). Electronically Signed   By: Prudencio Pair M.D.   On: 02/17/2020 21:25    Procedures Procedures (including critical care time)  Medications Ordered in ED Medications  lidocaine (LIDODERM) 5 % 1 patch (1 patch Transdermal Patch Applied 02/18/20 0707)  acetaminophen (TYLENOL) tablet 650 mg (650 mg Oral Given 02/18/20 5643)    ED Course  I have reviewed the triage vital signs and the nursing notes.  Pertinent labs & imaging results that were available during my care of the patient were reviewed by me and considered in my medical decision making (see chart for details).    MDM Rules/Calculators/A&P                         79 year old female presents to the ED due to right flank/mid back pain associated with dysuria and increased urinary frequency x1 week.  Patient has a history of renal cell adenocarcinoma status post right nephrectomy.  Denies fever and chills.  Unfortunately, patient waited 13 h prior to my initial  evaluation.  Stable vitals with mildly elevated BP.  Low suspicion for hypertensive urgency/emergency.  Patient in no acute distress and nontoxic-appearing.  Physical exam significant for reproducible right flank/mid back tenderness.  No overlying rash to suggest shingles.  Routine labs, UA, and CT renal scan ordered at triage.  CBC unremarkable no leukocytosis and normal hemoglobin.  BMP reassuring with elevated creatinine at 1.66 which appears to be patient's baseline.  No major electrolyte derangements.  UA without signs of infection or hematuria.  Low suspicion for pyelonephritis. Given reproducible nature, suspect MSK etiology vs. Early shingles infection. Low suspicion for PE. No clinical signs of DVT on exam. No hypoxia or tachycardia. Low suspicion for gallbladder etiology given no focal RUQ tenderness. Hepatic function panel reassuring. CT renal study personally reviewed which demonstrates: IMPRESSION:  Status post right-sided nephrectomy. No renal or collecting system  calculi.    No other acute intra-abdominopelvic pathology to explain the  patient's symptoms.   Low suspicion for dissection. Pain treated here in the ED. Patient drove herself to the ED so unable to given certain medications due to drowsiness. Patient discharged with Robaxin and tylenol. Instructed patient to follow-up with her nephrologist next week. Strict ED precautions discussed with patient. Patient states understanding and agrees to plan. Patient discharged home in no acute distress and stable vitals.  Discussed case with Dr. Jeanell Sparrow who evaluated patient at bedside who agrees with assessment and plan.   Final Clinical Impression(s) / ED Diagnoses Final diagnoses:  Right flank pain  Dysuria    Rx / DC Orders ED Discharge Orders         Ordered    methocarbamol (ROBAXIN) 500 MG tablet  2 times daily  02/18/20 0909    acetaminophen (TYLENOL 8 HOUR) 650 MG CR tablet  Every 8 hours PRN        02/18/20 0909            Suzy Bouchard, PA-C 02/18/20 4660    Pattricia Boss, MD 02/19/20 [phone]

## 2020-02-18 NOTE — ED Notes (Signed)
Called lab and spoke with [place]. Advised that the hepatic panel was added on abd being run.

## 2020-02-18 NOTE — Discharge Instructions (Addendum)
As discussed, all of your labs were reassuring today. Your CT scan did not show any abnormalities. I suspect your symptoms are related to muscular pain. I am sending you home with Tylenol and a muscle relaxer.  Take as needed.  Muscle relaxer can cause drowsiness do not drive or operate machinery while on the medication.  Please follow-up with your nephrologist for further evaluation of pain. Return to the ER for new or worsening symptoms.  You may also purchase over-the-counter Lidoderm patches and Voltaren gel for added pain relief.

## 2020-02-18 NOTE — ED Notes (Signed)
Lab contacted regarding urine culture add on.

## 2020-02-19 LAB — URINE CULTURE

## 2020-02-28 ENCOUNTER — Ambulatory Visit (HOSPITAL_COMMUNITY)
Admission: RE | Admit: 2020-02-28 | Discharge: 2020-02-28 | Disposition: A | Discharge status: Home / Self Care | Payer: Medicare Other | Source: Ambulatory Visit | Attending: Urology | Admitting: Urology

## 2020-02-28 ENCOUNTER — Other Ambulatory Visit: Payer: Self-pay

## 2020-02-28 DIAGNOSIS — Z85528 Personal history of other malignant neoplasm of kidney: Secondary | ICD-10-CM

## 2020-02-28 DIAGNOSIS — C641 Malignant neoplasm of right kidney, except renal pelvis: Secondary | ICD-10-CM | POA: Diagnosis not present

## 2020-02-28 DIAGNOSIS — Z905 Acquired absence of kidney: Secondary | ICD-10-CM | POA: Diagnosis not present

## 2020-02-29 DIAGNOSIS — M545 Low back pain, unspecified: Secondary | ICD-10-CM | POA: Diagnosis not present

## 2020-02-29 DIAGNOSIS — N184 Chronic kidney disease, stage 4 (severe): Secondary | ICD-10-CM | POA: Diagnosis not present

## 2020-02-29 DIAGNOSIS — E1122 Type 2 diabetes mellitus with diabetic chronic kidney disease: Secondary | ICD-10-CM | POA: Diagnosis not present

## 2020-02-29 DIAGNOSIS — Z794 Long term (current) use of insulin: Secondary | ICD-10-CM | POA: Diagnosis not present

## 2020-03-01 ENCOUNTER — Other Ambulatory Visit (HOSPITAL_COMMUNITY): Payer: Self-pay | Admitting: Urology

## 2020-03-01 ENCOUNTER — Other Ambulatory Visit: Payer: Self-pay

## 2020-03-01 ENCOUNTER — Ambulatory Visit (HOSPITAL_COMMUNITY)
Admission: RE | Admit: 2020-03-01 | Discharge: 2020-03-01 | Disposition: A | Discharge status: Home / Self Care | Payer: Medicare Other | Source: Ambulatory Visit | Attending: Urology | Admitting: Urology

## 2020-03-01 DIAGNOSIS — Z960 Presence of urogenital implants: Secondary | ICD-10-CM | POA: Diagnosis not present

## 2020-03-01 DIAGNOSIS — Z85528 Personal history of other malignant neoplasm of kidney: Secondary | ICD-10-CM | POA: Insufficient documentation

## 2020-04-12 IMAGING — CR DG CHEST 2V
2 series · 2 of 2 positions shown · non-contrast
Comparison: November 15, 2016

CLINICAL DATA: Dizziness with blurred vision.  Pain.

EXAM:
CHEST - 2 VIEW

[w chest lat]
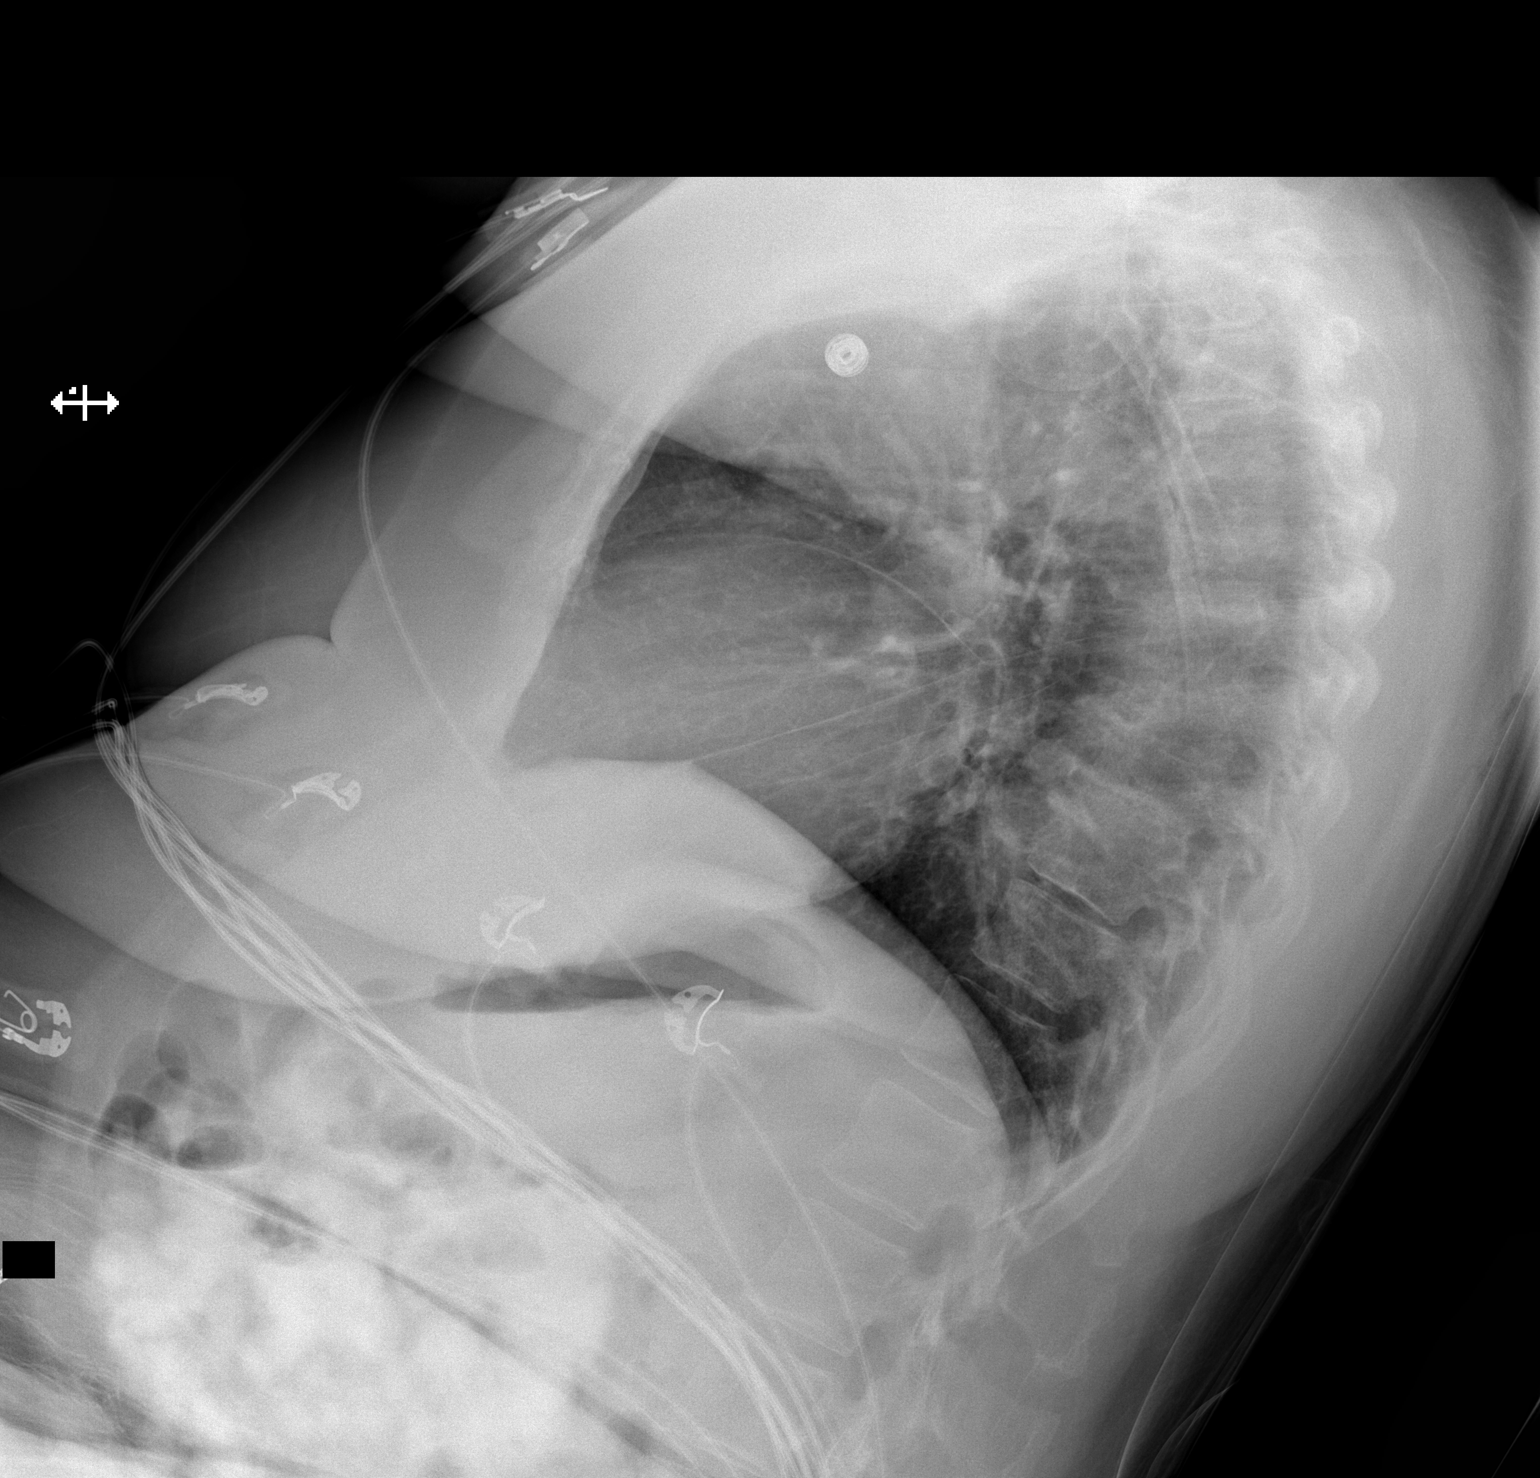

[x chest ap]
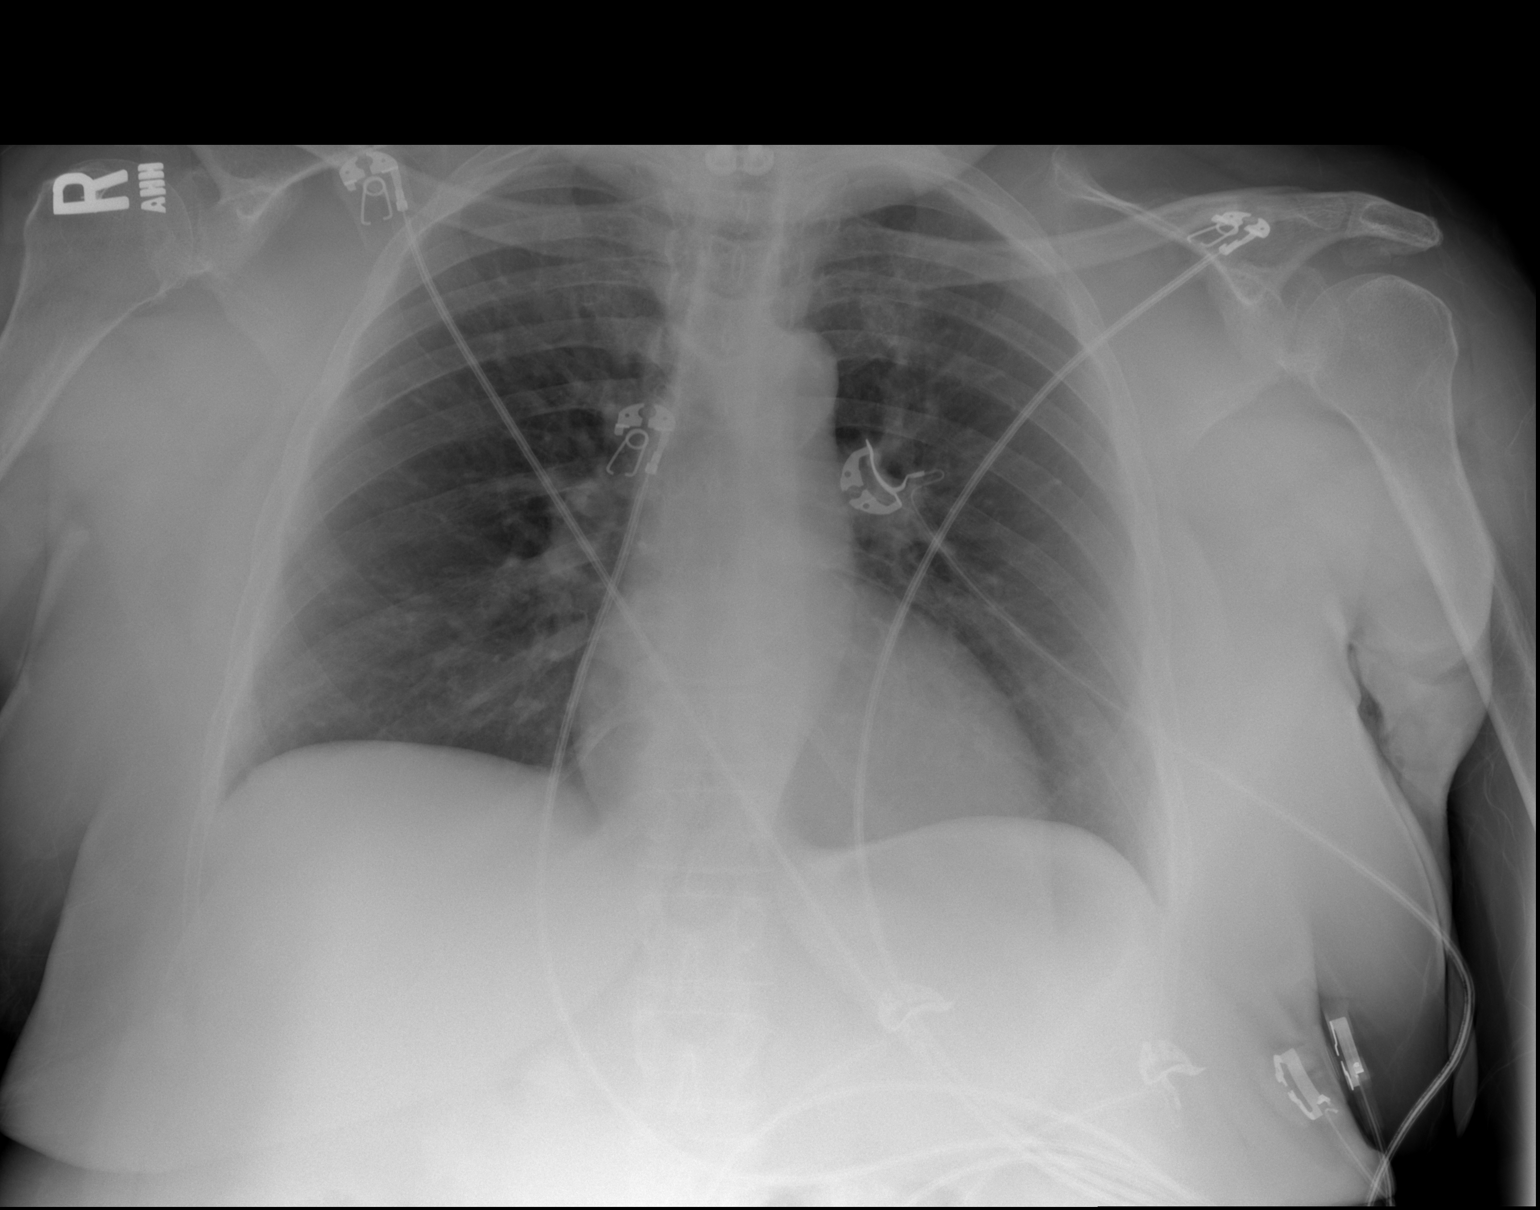

[2 of 2 positions shown; findings below may reference images not displayed]

FINDINGS: There is no edema or consolidation. The heart size and pulmonary
vascularity are normal. No adenopathy. There is postoperative change
in the lower cervical spine region.
IMPRESSION: No edema or consolidation.

## 2020-04-12 IMAGING — CT CT ABD-PELV W/O CM
2 of 4 series · 17 of 46 positions shown, 19 images · non-contrast
Comparison: 02/12/2015

CLINICAL DATA: Dizziness for 1 week, blurred vision, abdominal pain
and nausea for 4 days, urinary problems, history asthma, diabetes
mellitus, hypertension, and renal cell carcinoma post RIGHT
nephrectomy, GERD

EXAM:
CT ABDOMEN AND PELVIS WITHOUT CONTRAST
TECHNIQUE: Multidetector CT imaging of the abdomen and pelvis was performed
following the standard protocol without IV contrast. Sagittal and
coronal MPR images reconstructed from axial data set. Patient drank
dilute oral contrast for exam

[Series 2: axial st · axial · 0.84mm/px · z∈[-734,-394]mm · 14 of 78 slices shown, 16 images]
[im 5/78  soft-tissue]
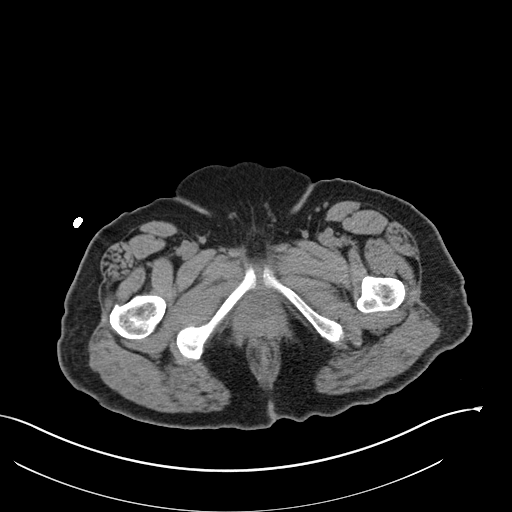
[im 5/78  bone]
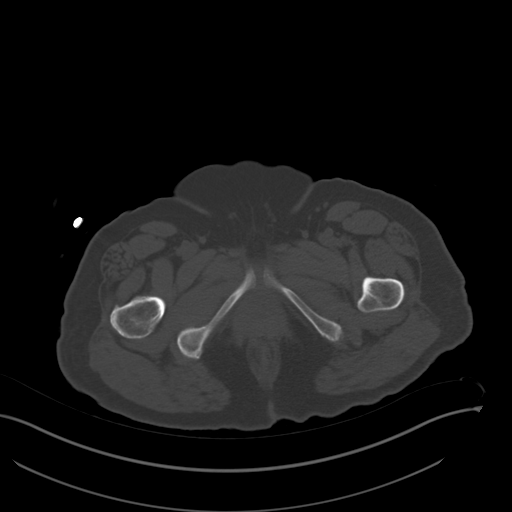
[im 9/78  soft-tissue]
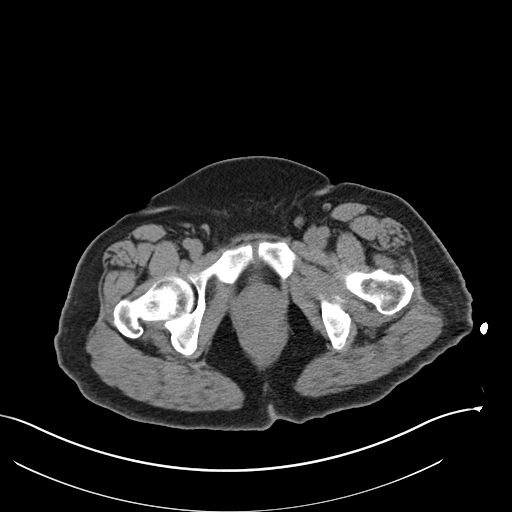
[im 18/78  soft-tissue]
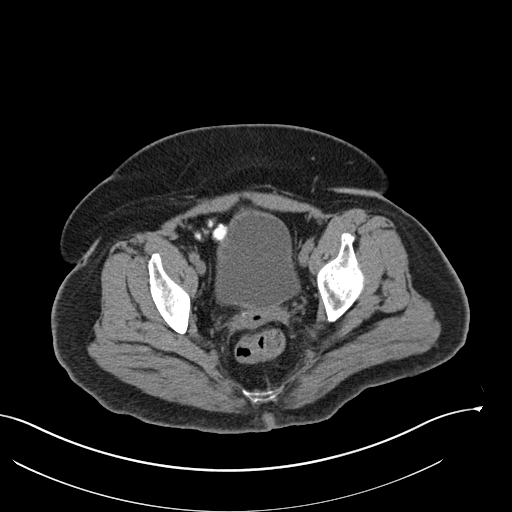
[im 22/78  soft-tissue]
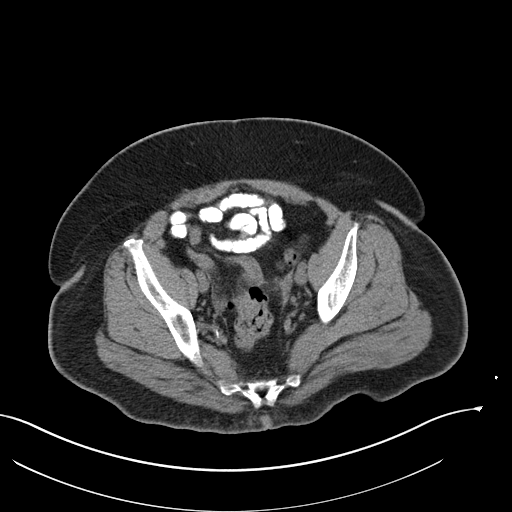
[im 26/78  soft-tissue]
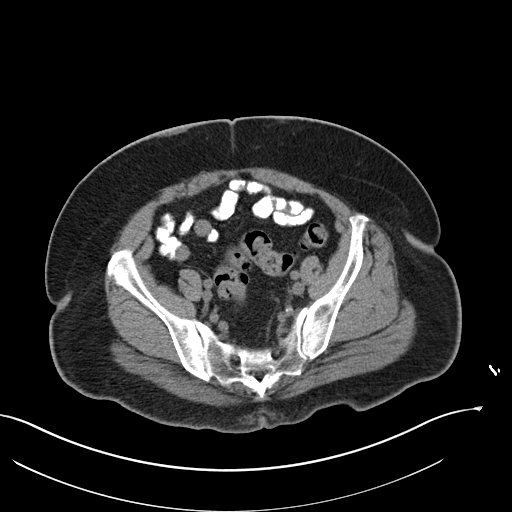
[im 30/78  soft-tissue]
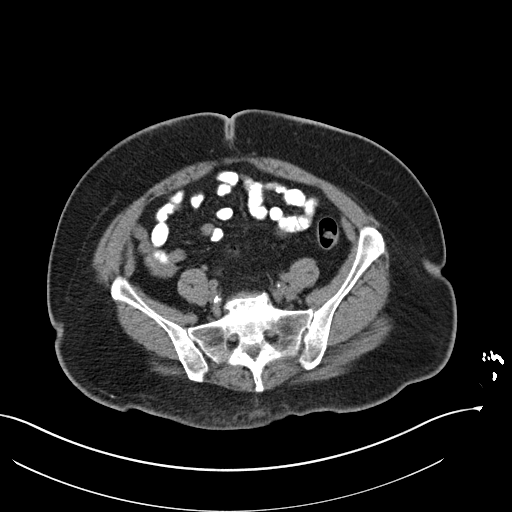
[im 35/78  soft-tissue]
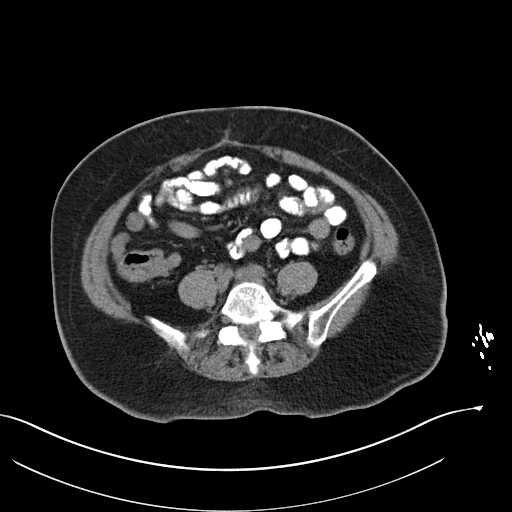
[im 43/78  soft-tissue]
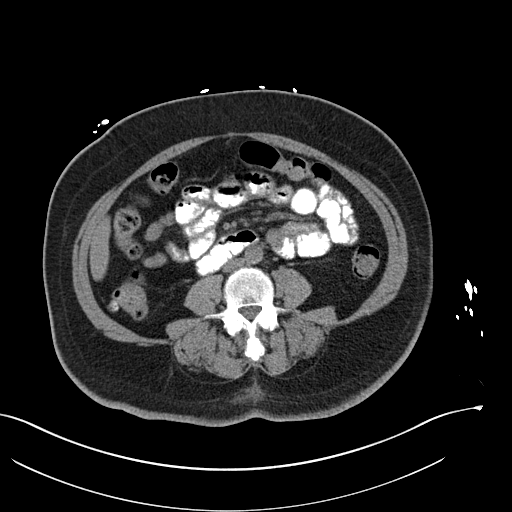
[im 48/78  soft-tissue]
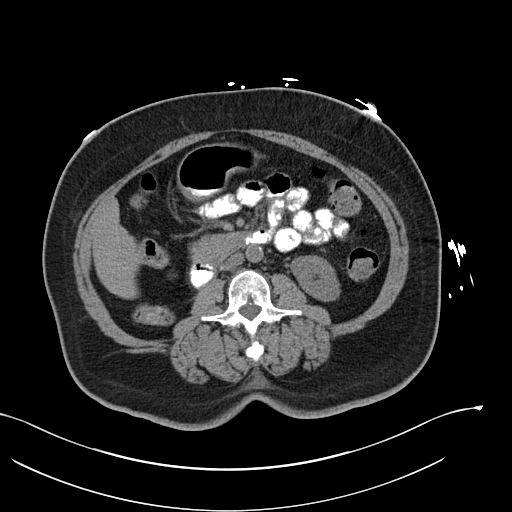
[im 48/78  bone]
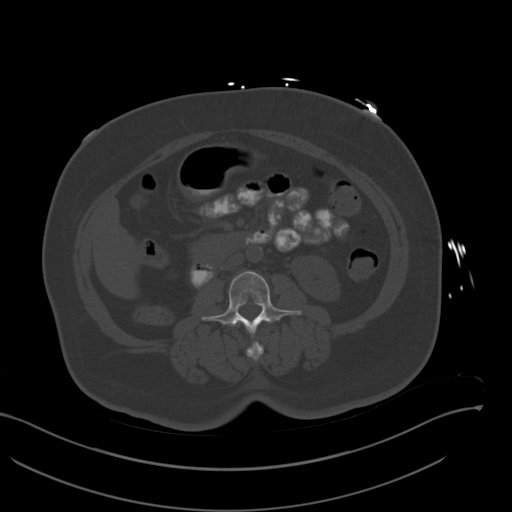
[im 52/78  soft-tissue]
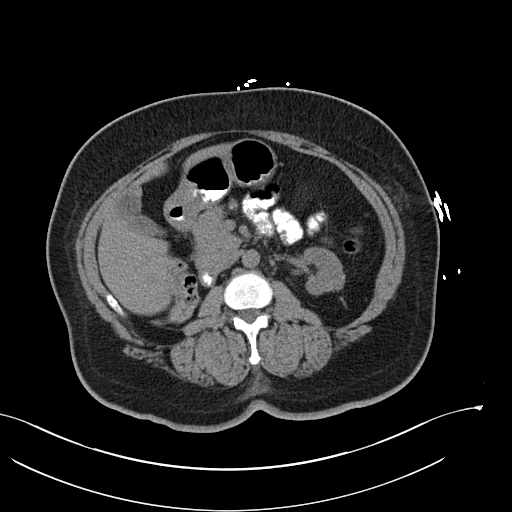
[im 56/78  soft-tissue]
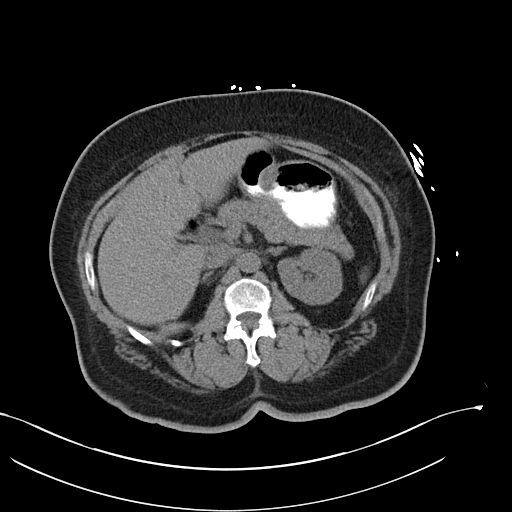
[im 60/78  soft-tissue]
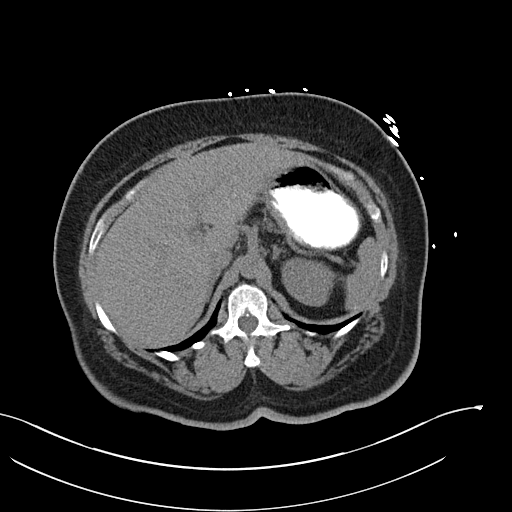
[im 69/78  soft-tissue]
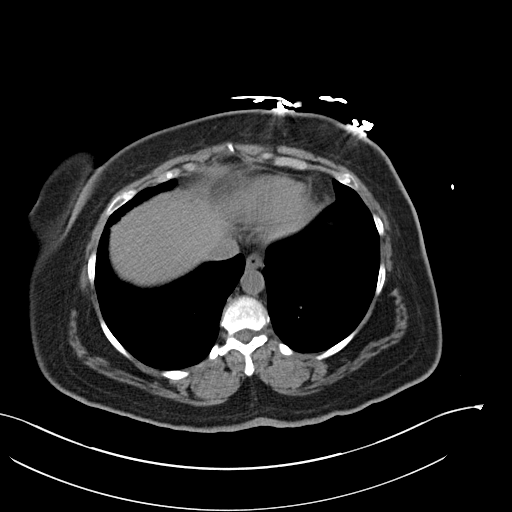
[im 73/78  soft-tissue]
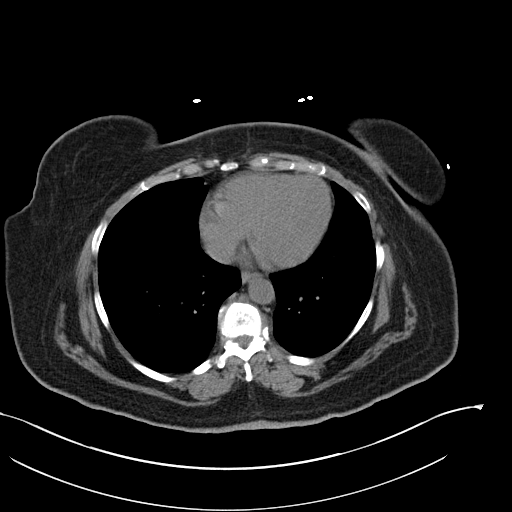

[Series 5: coronal st · coronal · 0.79mm/px · 3 of 100 slices shown]
[im 34/100  soft-tissue]
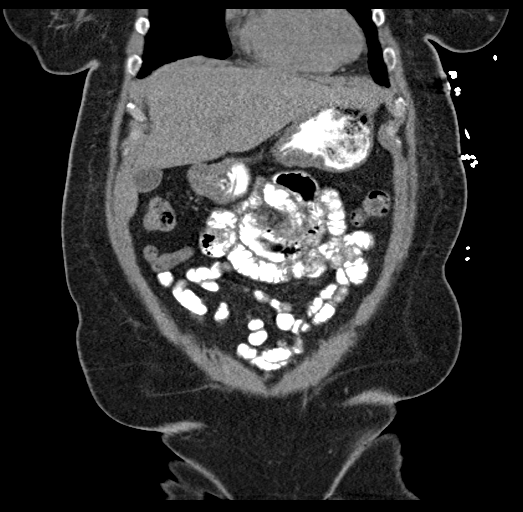
[im 45/100  soft-tissue]
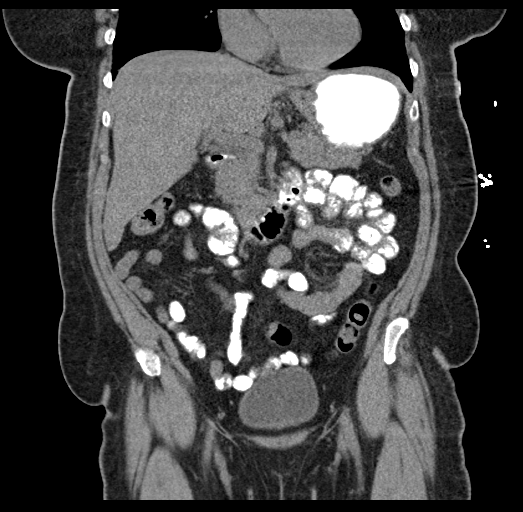
[im 56/100  soft-tissue]
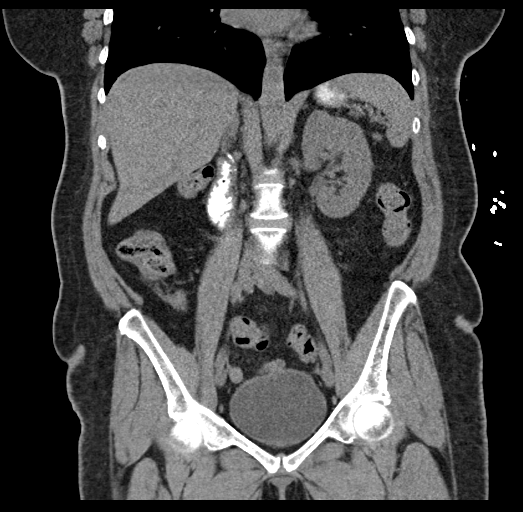

[17 of 46 positions shown; findings below may reference images not displayed]

FINDINGS: Lower chest: Minimal peripheral chronic interstitial changes at lung
bases without acute infiltrate

Hepatobiliary: Gallbladder and liver normal appearance

Pancreas: Normal appearance

Spleen: Normal appearance

Adrenals/Urinary Tract: Post RIGHT nephrectomy. Adrenal glands
normal. LEFT kidney normal appearance. No urinary tract
calcification or LEFT hydroureteronephrosis. Bladder unremarkable.

Stomach/Bowel: Single diverticulum at ascending colon. Stomach and
bowel loops otherwise normal appearance. Appendix surgically absent
by history.

Vascular/Lymphatic: Minor atherosclerotic calcifications aorta and
iliac arteries without aneurysm. No adenopathy. Few pelvic
phleboliths.

Reproductive: Uterus surgically absent with unremarkable LEFT ovary
and nonvisualization of RIGHT ovary

Other: No free air or free fluid. Small umbilical and supraumbilical
ventral hernias containing fat.

Musculoskeletal: No acute osseous lesions.
IMPRESSION: Post RIGHT nephrectomy, hysterectomy, and appendectomy.

No acute intra-abdominal or intrapelvic abnormalities.

Small umbilical and supraumbilical ventral hernias containing fat.

## 2020-04-12 IMAGING — CT CT HEAD W/O CM
3 series · 15 of 47 positions shown, 18 images · non-contrast
Comparison: 06/07/2012

CLINICAL DATA: Dizziness for 1 week, blurred vision, acute
headache, abdominal pain with nausea for 4 days, history of asthma,
type I diabetes mellitus, hypertension, renal cell carcinoma

EXAM:
CT HEAD WITHOUT CONTRAST
TECHNIQUE: Contiguous axial images were obtained from the base of the skull
through the vertex without intravenous contrast. Sagittal and
coronal MPR images reconstructed from axial data set.

[Series 2: head wo · axial · 0.47mm/px · z∈[-146,-16]mm · 9 of 32 slices shown, 12 images]
[im 3/32  brain]
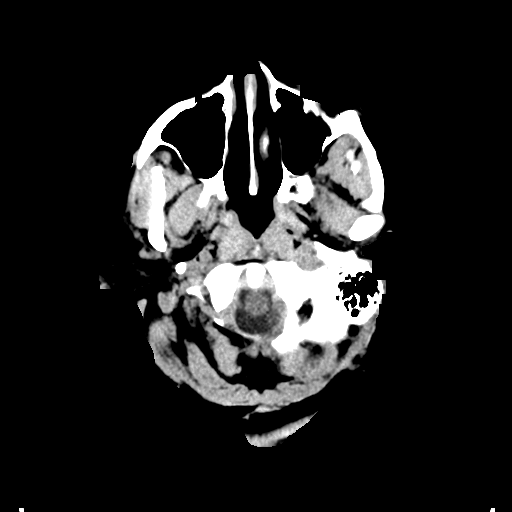
[im 3/32  bone]
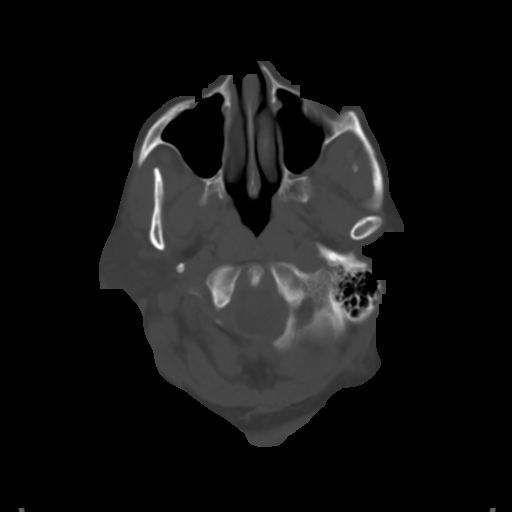
[im 6/32  brain]
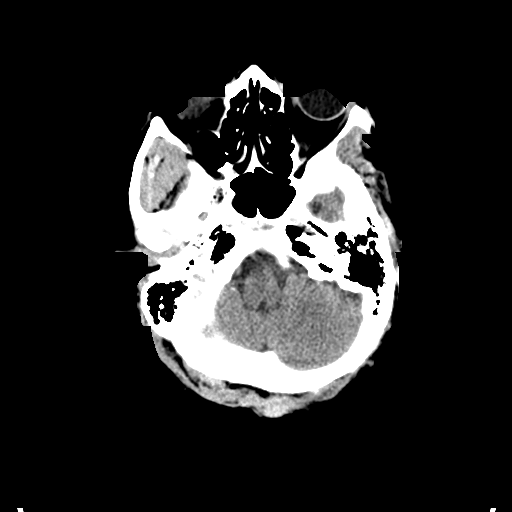
[im 9/32  brain]
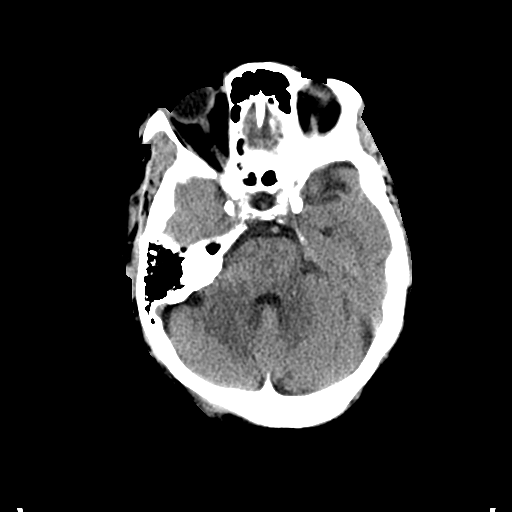
[im 12/32  brain]
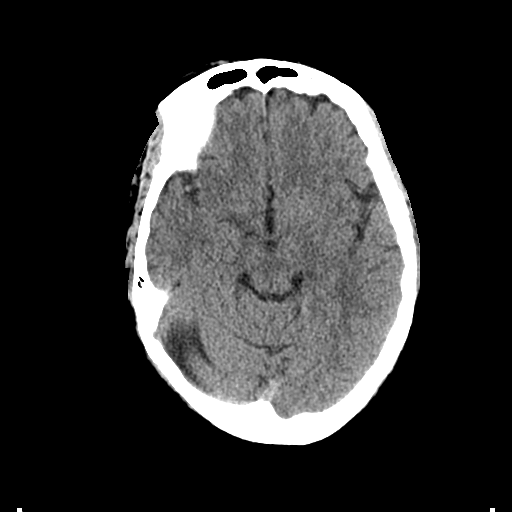
[im 17/32  brain]
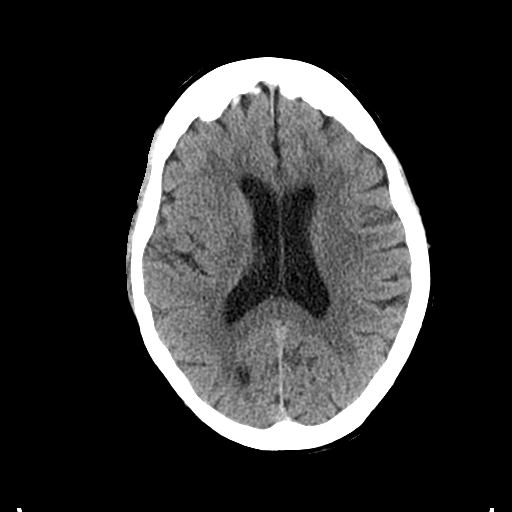
[im 17/32  bone]
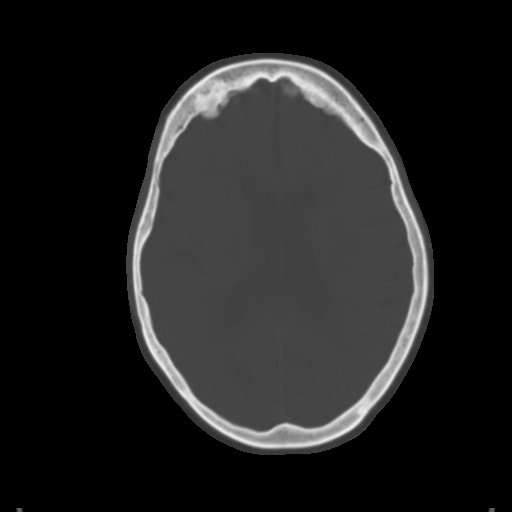
[im 20/32  brain]
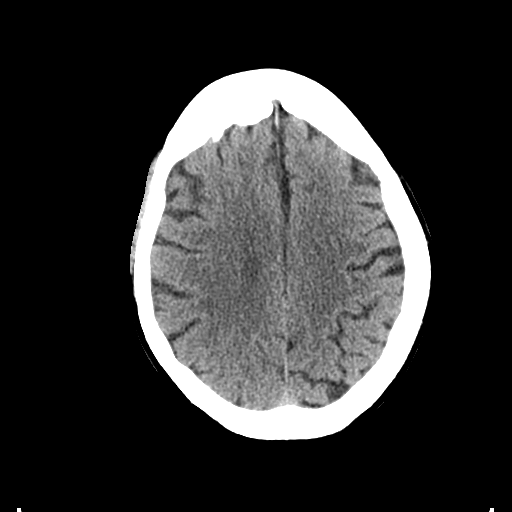
[im 23/32  brain]
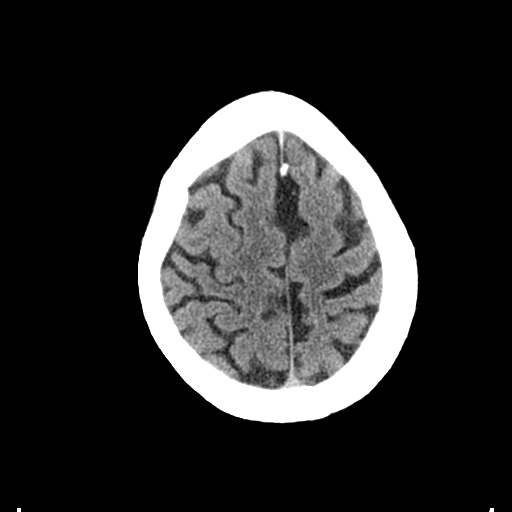
[im 26/32  brain]
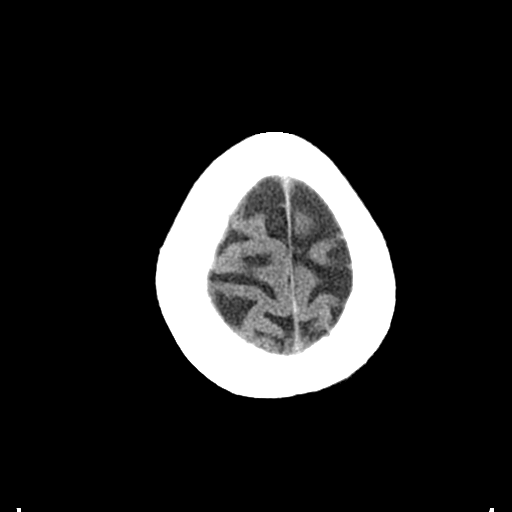
[im 29/32  brain]
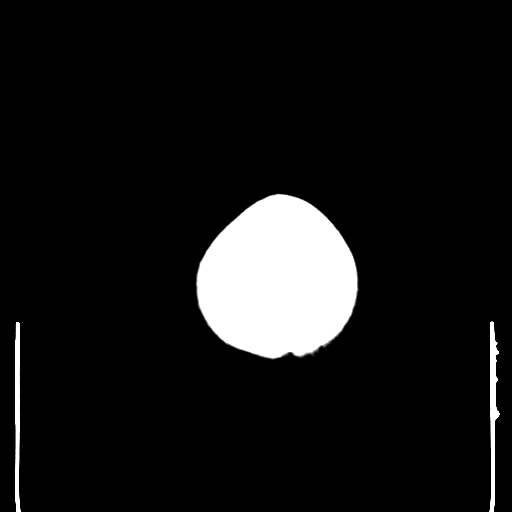
[im 29/32  bone]
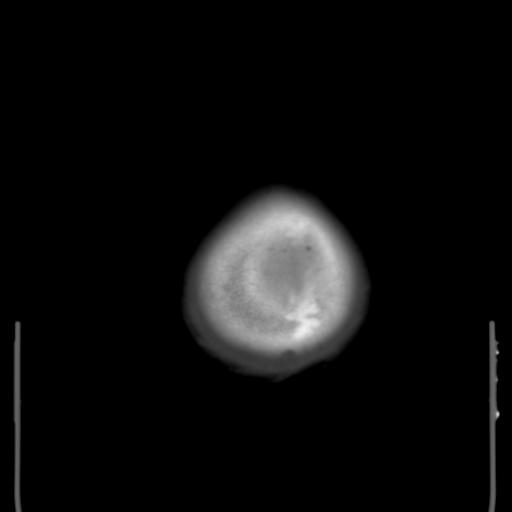

[Series 4: coronal soft tissue · coronal · 0.31mm/px · 3 of 84 slices shown]
[im 28/84  brain]
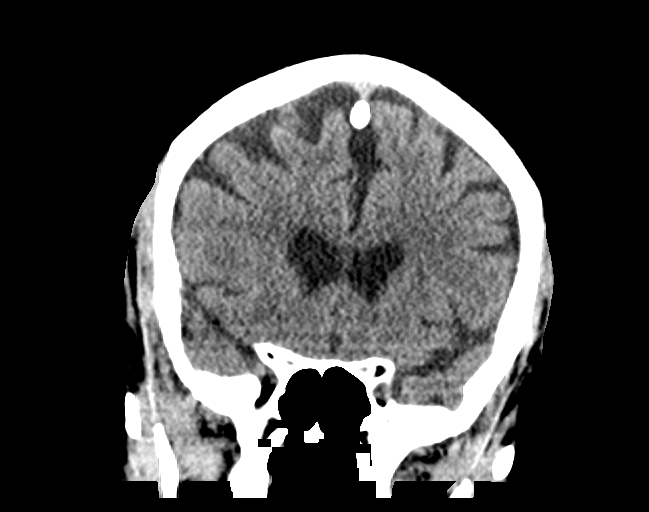
[im 37/84  brain]
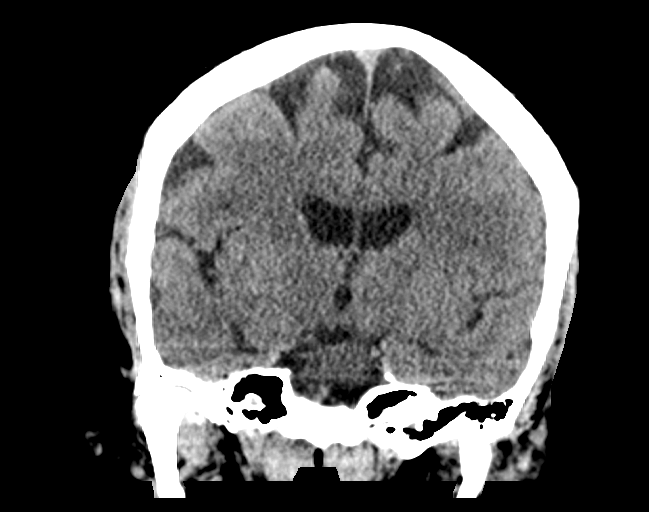
[im 47/84  brain]
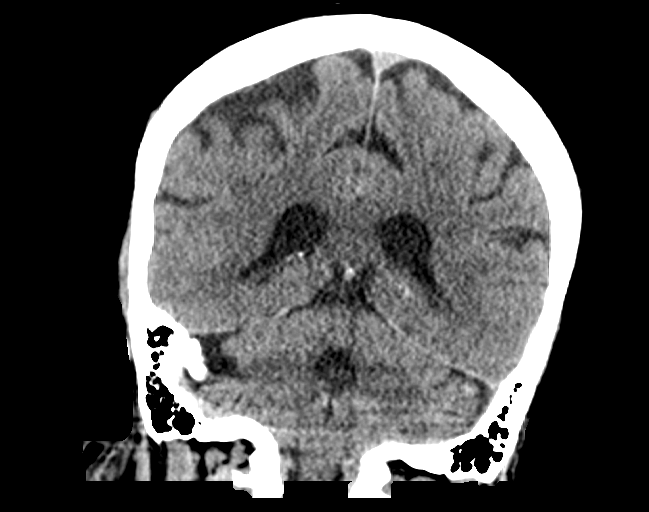

[Series 5: sagittal soft tissue · sagittal · 0.34mm/px · 3 of 65 slices shown]
[im 22/65  brain]
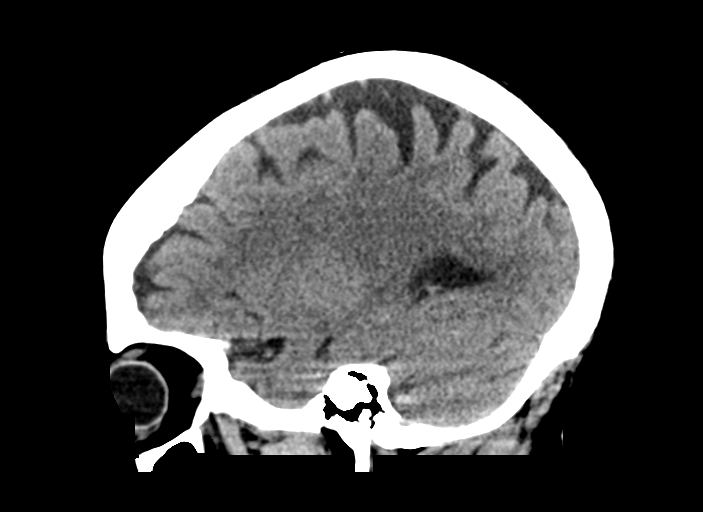
[im 33/65  brain]
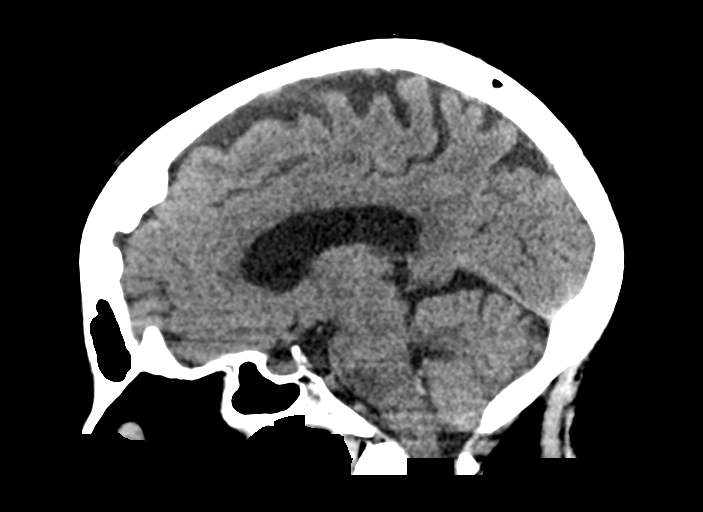
[im 43/65  brain]
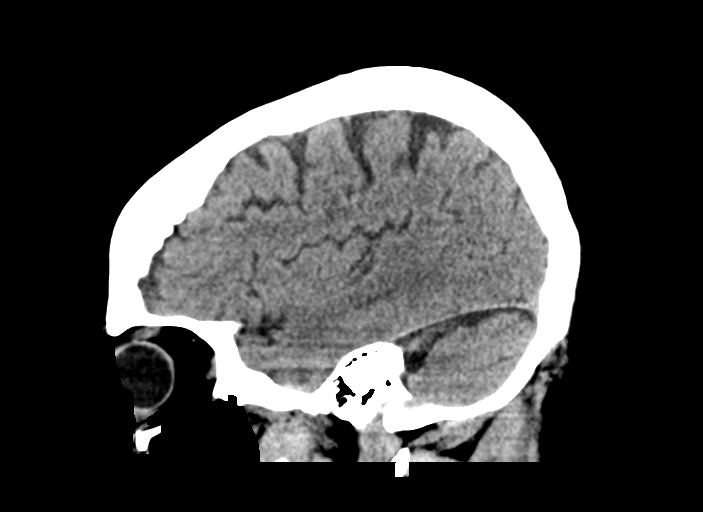

[15 of 47 positions shown; findings below may reference images not displayed]

FINDINGS: Brain: Mild atrophy. Normal ventricular morphology. No midline shift
or mass effect. Otherwise normal appearance of brain parenchyma. No
intracranial hemorrhage, mass lesion or evidence of acute
infarction. No extra-axial fluid collections.

Vascular: Mild atherosclerotic calcification of internal carotid
arteries at skull base

Skull: Intact

Sinuses/Orbits: Clear

Other: N/A
IMPRESSION: No acute intracranial abnormalities.

## 2020-04-17 DIAGNOSIS — N184 Chronic kidney disease, stage 4 (severe): Secondary | ICD-10-CM | POA: Diagnosis not present

## 2020-04-17 DIAGNOSIS — I129 Hypertensive chronic kidney disease with stage 1 through stage 4 chronic kidney disease, or unspecified chronic kidney disease: Secondary | ICD-10-CM | POA: Diagnosis not present

## 2020-04-17 DIAGNOSIS — Z794 Long term (current) use of insulin: Secondary | ICD-10-CM | POA: Diagnosis not present

## 2020-04-17 DIAGNOSIS — E1122 Type 2 diabetes mellitus with diabetic chronic kidney disease: Secondary | ICD-10-CM | POA: Diagnosis not present

## 2020-05-01 DIAGNOSIS — M159 Polyosteoarthritis, unspecified: Secondary | ICD-10-CM | POA: Diagnosis not present

## 2020-05-01 DIAGNOSIS — E1142 Type 2 diabetes mellitus with diabetic polyneuropathy: Secondary | ICD-10-CM | POA: Diagnosis not present

## 2020-05-01 DIAGNOSIS — I10 Essential (primary) hypertension: Secondary | ICD-10-CM | POA: Diagnosis not present

## 2020-05-01 DIAGNOSIS — M545 Low back pain, unspecified: Secondary | ICD-10-CM | POA: Diagnosis not present

## 2020-05-04 ENCOUNTER — Emergency Department (HOSPITAL_COMMUNITY)
Admission: EM | Admit: 2020-05-04 | Discharge: 2020-05-04 | Disposition: A | Discharge status: Home / Self Care | Payer: Medicare Other | Attending: Emergency Medicine | Admitting: Emergency Medicine

## 2020-05-04 ENCOUNTER — Emergency Department (HOSPITAL_COMMUNITY): Payer: Medicare Other

## 2020-05-04 ENCOUNTER — Encounter (HOSPITAL_COMMUNITY): Payer: Self-pay | Admitting: *Deleted

## 2020-05-04 DIAGNOSIS — M4802 Spinal stenosis, cervical region: Secondary | ICD-10-CM | POA: Diagnosis not present

## 2020-05-04 DIAGNOSIS — M4804 Spinal stenosis, thoracic region: Secondary | ICD-10-CM | POA: Diagnosis not present

## 2020-05-04 DIAGNOSIS — E119 Type 2 diabetes mellitus without complications: Secondary | ICD-10-CM | POA: Diagnosis not present

## 2020-05-04 DIAGNOSIS — Z7982 Long term (current) use of aspirin: Secondary | ICD-10-CM | POA: Diagnosis not present

## 2020-05-04 DIAGNOSIS — Z85528 Personal history of other malignant neoplasm of kidney: Secondary | ICD-10-CM | POA: Insufficient documentation

## 2020-05-04 DIAGNOSIS — M79604 Pain in right leg: Secondary | ICD-10-CM | POA: Diagnosis not present

## 2020-05-04 DIAGNOSIS — Z79899 Other long term (current) drug therapy: Secondary | ICD-10-CM | POA: Diagnosis not present

## 2020-05-04 DIAGNOSIS — I10 Essential (primary) hypertension: Secondary | ICD-10-CM | POA: Diagnosis not present

## 2020-05-04 DIAGNOSIS — Z794 Long term (current) use of insulin: Secondary | ICD-10-CM | POA: Diagnosis not present

## 2020-05-04 DIAGNOSIS — M5134 Other intervertebral disc degeneration, thoracic region: Secondary | ICD-10-CM | POA: Diagnosis not present

## 2020-05-04 DIAGNOSIS — J45909 Unspecified asthma, uncomplicated: Secondary | ICD-10-CM | POA: Diagnosis not present

## 2020-05-04 DIAGNOSIS — M545 Low back pain, unspecified: Secondary | ICD-10-CM | POA: Diagnosis not present

## 2020-05-04 DIAGNOSIS — M5124 Other intervertebral disc displacement, thoracic region: Secondary | ICD-10-CM | POA: Diagnosis not present

## 2020-05-04 LAB — BASIC METABOLIC PANEL
Anion gap: 10 (ref 5–15)
BUN: 24 mg/dL — ABNORMAL HIGH (ref 8–23)
CO2: 26 mmol/L (ref 22–32)
Calcium: 9.4 mg/dL (ref 8.9–10.3)
Chloride: 103 mmol/L (ref 98–111)
Creatinine, Ser: 1.78 mg/dL — ABNORMAL HIGH (ref 0.44–1.00)
GFR, Estimated: 28 mL/min — ABNORMAL LOW (ref >60)
Glucose, Bld: 269 mg/dL — ABNORMAL HIGH (ref 70–99)
Potassium: 5.1 mmol/L (ref 3.5–5.1)
Sodium: 139 mmol/L (ref 135–145)

## 2020-05-04 LAB — CBC WITH DIFFERENTIAL/PLATELET
Abs Immature Granulocytes: 0.01 10*3/uL (ref 0.00–0.07)
Basophils Absolute: 0 10*3/uL (ref 0.0–0.1)
Basophils Relative: 1 %
Eosinophils Absolute: 0.2 10*3/uL (ref 0.0–0.5)
Eosinophils Relative: 5 %
HCT: 39.7 % (ref 36.0–46.0)
Hemoglobin: 12.9 g/dL (ref 12.0–15.0)
Immature Granulocytes: 0 %
Lymphocytes Relative: 39 %
Lymphs Abs: 1.8 10*3/uL (ref 0.7–4.0)
MCH: 30.5 pg (ref 26.0–34.0)
MCHC: 32.5 g/dL (ref 30.0–36.0)
MCV: 93.9 fL (ref 80.0–100.0)
Monocytes Absolute: 0.5 10*3/uL (ref 0.1–1.0)
Monocytes Relative: 11 %
Neutro Abs: 2 10*3/uL (ref 1.7–7.7)
Neutrophils Relative %: 44 %
Platelets: 214 10*3/uL (ref 150–400)
RBC: 4.23 MIL/uL (ref 3.87–5.11)
RDW: 13.5 % (ref 11.5–15.5)
WBC: 4.6 10*3/uL (ref 4.0–10.5)
nRBC: 0 % (ref 0.0–0.2)

## 2020-05-04 LAB — CBG MONITORING, ED: Glucose-Capillary: 85 mg/dL (ref 70–99)

## 2020-05-04 MED ORDER — GADOBUTROL 1 MMOL/ML IV SOLN
6.0000 mL | Freq: Once | INTRAVENOUS | Status: AC | PRN
  Administered 2020-05-04: 6 mL via INTRAVENOUS

## 2020-05-04 MED ORDER — OXYCODONE HCL 5 MG PO TABS: 5.0000 mg | ORAL_TABLET | Freq: Four times a day (QID) | ORAL | 0 refills | Status: DC | PRN

## 2020-05-04 MED ORDER — METHYLPREDNISOLONE 4 MG PO TBPK: ORAL_TABLET | ORAL | 0 refills | Status: DC

## 2020-05-04 MED ORDER — MORPHINE SULFATE (PF) 4 MG/ML IV SOLN
4.0000 mg | Freq: Once | INTRAVENOUS | Status: AC
  Administered 2020-05-04: 4 mg via INTRAVENOUS
  Filled 2020-05-04: qty 1

## 2020-05-04 MED ORDER — MORPHINE SULFATE (PF) 2 MG/ML IV SOLN
2.0000 mg | Freq: Once | INTRAVENOUS | Status: AC
  Administered 2020-05-04: 2 mg via INTRAVENOUS
  Filled 2020-05-04: qty 1

## 2020-05-04 NOTE — ED Provider Notes (Signed)
[facility] DEPT Provider Note   CSN: [id number] Arrival date & time: 05/04/20  9563     History Chief Complaint  Patient presents with  . Leg Pain    [name] is a 80 y.o. female.  80 year old female with prior medical history as detailed below presents for evaluation.  Patient complains of right lower back pain with radiation into the right leg.  Patient reports the pain has been uncomfortable for the last week and a half.  She denies specific injury.  She denies fever.  She denies difficulty ambulating.  She reports painful sharp tingling pain that radiates from the right low back all the way down to her toes.  Her PCP gave her Norco to try for pain without control of her symptoms per report.  She denies urinary incontinence.  She denies changes in her BMs.  The history is provided by the patient and medical records.  Leg Pain Location:  Leg Leg location:  R leg Pain details:    Quality:  Aching   Radiates to:  Back   Severity:  Mild   Onset quality:  Gradual   Duration:  2 weeks   Timing:  Constant   Progression:  Waxing and waning Chronicity:  New      Past Medical History:  Diagnosis Date  . ACS (acute coronary syndrome) ([place]) 12/27/2010  . AKI (acute kidney injury) ([place]) 03/25/2018  . Anginal pain ([place])   . Arthritis   . Asthma   . Diabetes mellitus    Type 1  . Dysrhythmia    "irregular heart beat"  . GERD (gastroesophageal reflux disease)   . History of urinary tract infection   . Hypercholesteremia   . Hypertension   . left renal ca dx'd 2012 (?)   surg only, left kidney  . Nocturia   . Reflux   . Renal failure (ARF), acute on chronic (HCC)   . Shortness of breath dyspnea    pt denies; states can climb stairs w/o difficulty   . Sleep apnea    does not use c-pap machine  . Tingling in extremities    legs bilat  . Tinnitus   . Urinary frequency   . Urinary incontinence   . Vertigo    occurs when lying flat      Patient Active Problem List   Diagnosis Date Noted  . Pain due to onychomycosis of toenails of both feet 09/01/2018  . Left shoulder pain   . Limited joint range of motion (ROM)   . Chest pain 10/23/2015  . Diabetes mellitus type 2 in obese ([place]) 10/23/2015  . Chest pain with high risk for cardiac etiology 07/28/2014  . Diabetes mellitus 12/27/2010  . Hypertension 12/27/2010  . Hyperlipidemia 12/27/2010  . GERD (gastroesophageal reflux disease) 12/27/2010  . Renal cell adenocarcinoma ([place]) 12/27/2010  . Arthritis 12/27/2010  . Cervical stenosis of spine 12/27/2010  . Carpal tunnel syndrome 12/27/2010  . Asthma 12/27/2010    Past Surgical History:  Procedure Laterality Date  . ABDOMINAL HYSTERECTOMY    . APPENDECTOMY    . CARDIAC CATHETERIZATION N/A 07/28/2014   Procedure: Left Heart Cath and Coronary Angiography;  Surgeon: [name], MD;  Location: [facility];  Service: Cardiovascular;  Laterality: N/A;  . CYSTOSCOPY WITH RETROGRADE PYELOGRAM, URETEROSCOPY AND STENT PLACEMENT Right 02/09/2015   Procedure: CYSTOSCOPY WITH RETROGRADE PYELOGRAM AND URETEROSCOPY ;  Surgeon: Raynelle Bring, MD;  Location: [facility];  Service: Urology;  Laterality:  Right;  Marland Kitchen LAPAROSCOPIC NEPHRECTOMY Right 03/20/2015   Procedure: LAPAROSCOPIC NEPHRECTOMY;  Surgeon: Raynelle Bring, MD;  Location: [facility];  Service: Urology;  Laterality: Right;  . PERIPHERAL VASCULAR CATHETERIZATION N/A 07/28/2014   Procedure: Aortic Arch Angiography;  Surgeon: [name], MD;  Location: [facility];  Service: Cardiovascular;  Laterality: N/A;  . RENAL MASS EXCISION    . SPINE SURGERY       OB History   No obstetric history on file.     Family History  Problem Relation Age of Onset  . Hypertension Mother   . Coronary artery disease Other   . Breast cancer Cousin     Social History   Tobacco Use  . Smoking status: Never Smoker  . Smokeless tobacco: Never Used  Vaping Use  . Vaping Use: Never used   Substance Use Topics  . Alcohol use: No  . Drug use: No    Home Medications Prior to Admission medications   Medication Sig Start Date End Date Taking? Authorizing Provider  acetaminophen (TYLENOL 8 HOUR) 650 MG CR tablet Take 1 tablet (650 mg total) by mouth every 8 (eight) hours as needed for pain. 02/18/20   Suzy Bouchard, PA-C  albuterol (PROVENTIL,VENTOLIN) 90 MCG/ACT inhaler Inhale 2 puffs into the lungs every 4 (four) hours as needed for wheezing or shortness of breath.     [provider]  amLODipine (NORVASC) 5 MG tablet TAKE 1 TABLET BY MOUTH EVERY DAY 06/28/19   Patwardhan, Manish J, MD  aspirin EC 81 MG tablet Take 81 mg by mouth at bedtime.    [provider]  atorvastatin (LIPITOR) 10 MG tablet Take 10 mg by mouth every morning.     [provider]  cholecalciferol (VITAMIN D) 1000 units tablet Take 1,000 Units by mouth daily.    [provider]  donepezil (ARICEPT) 10 MG tablet Take 1 tablet (10 mg total) by mouth at bedtime. 11/09/19   Star Age, MD  gabapentin (NEURONTIN) 300 MG capsule Take 1 capsule (300 mg total) by mouth at bedtime. 07/16/16   Wallene Huh, DPM  Insulin Degludec (TRESIBA FLEXTOUCH) 200 UNIT/ML SOPN Inject 78 Units into the skin daily before breakfast.     [provider]  lidocaine (LIDODERM) 5 % Place 1 patch onto the skin daily. Remove & Discard patch within 12 hours or as directed by MD 03/27/18   Reyne Dumas, MD  methocarbamol (ROBAXIN) 500 MG tablet Take 1 tablet (500 mg total) by mouth 2 (two) times daily. 02/18/20   Suzy Bouchard, PA-C  metoprolol succinate (TOPROL-XL) 100 MG 24 hr tablet TAKE 1 TABLET (100 MG TOTAL) BY MOUTH DAILY. PT NEEDS TO SCHEDULE F/U APPT FOR FURTHER REFILLS. 01/05/20   [name], MD  mirabegron ER (MYRBETRIQ) 50 MG TB24 tablet Take 50 mg by mouth daily.    [provider]  Multiple Vitamins-Minerals (CENTRUM SILVER ADULT 50+ PO) Take 1 tablet by mouth  daily.    [provider]  nitroGLYCERIN (NITROSTAT) 0.4 MG SL tablet Place 0.4 mg under the tongue every 5 (five) minutes as needed for chest pain.    [provider]  pantoprazole (PROTONIX) 40 MG tablet Take 40 mg by mouth daily as needed (heartburn).  03/25/19   [provider]  TRADJENTA 5 MG TABS tablet Take 5 mg by mouth daily. 06/24/19   [provider]    Allergies    Sulfa antibiotics  Review of Systems  Review of Systems  All other systems reviewed and are negative.   Physical Exam Updated Vital Signs BP (!) 166/119 (BP Location: Left Arm)   Pulse 68   Temp 98.2 F (36.8 C) (Oral)   Resp 18   SpO2 98%   Physical Exam Vitals and nursing note reviewed.  Constitutional:      General: She is not in acute distress.    Appearance: Normal appearance. She is well-developed.  HENT:     Head: Normocephalic and atraumatic.  Eyes:     Conjunctiva/sclera: Conjunctivae normal.     Pupils: Pupils are equal, round, and reactive to light.  Cardiovascular:     Rate and Rhythm: Normal rate and regular rhythm.     Heart sounds: Normal heart sounds.  Pulmonary:     Effort: Pulmonary effort is normal. No respiratory distress.     Breath sounds: Normal breath sounds.  Abdominal:     General: There is no distension.     Palpations: Abdomen is soft.     Tenderness: There is no abdominal tenderness.  Musculoskeletal:        General: No deformity. Normal range of motion.     Cervical back: Normal range of motion and neck supple.  Skin:    General: Skin is warm and dry.  Neurological:     Mental Status: She is alert and oriented to person, place, and time. Mental status is at baseline.     Comments: Ambulatory  5/5 strength to BLE  Intact gross sensation to distal BLE     ED Results / Procedures / Treatments   Labs (all labs ordered are listed, but only abnormal results are displayed) Labs Reviewed  BASIC METABOLIC PANEL - Abnormal;  Notable for the following components:      Result Value   Glucose, Bld 269 (*)    BUN 24 (*)    Creatinine, Ser 1.78 (*)    GFR, Estimated 28 (*)    All other components within normal limits  CBC WITH DIFFERENTIAL/PLATELET  URINALYSIS, ROUTINE W REFLEX MICROSCOPIC  CBG MONITORING, ED    EKG None  Radiology MR LUMBAR SPINE WO CONTRAST  Result Date: 05/04/2020 CLINICAL DATA:  Low back pain.  Infection suspected. EXAM: MRI LUMBAR SPINE WITHOUT CONTRAST TECHNIQUE: Multiplanar, multisequence MR imaging of the lumbar spine was performed. No intravenous contrast was administered. COMPARISON:  MRI thoracic spine 03/26/2018.  Lumbar MRI 03/26/2018 FINDINGS: Segmentation:  Normal Alignment:  Slight anterolisthesis C4-5 otherwise normal alignment Vertebrae: Patchy bone marrow edema T11 vertebral body diffusely. This could be due to fracture or mass or degenerative change. Bone marrow edema not typical in location and appearance for infection. No significant vertebral body loss of height identified. No lumbar fracture or mass. Conus medullaris and cauda equina: Conus extends to the L1-2 level. Conus and cauda equina appear normal. Paraspinal and other soft tissues: Negative for paraspinous mass or adenopathy. No paraspinous edema. Disc levels: T10-11: Disc degeneration with disc space narrowing. Diffuse endplate spurring and broad-based disc protrusion. Bilateral facet hypertrophy. Severe spinal stenosis with cord compression and probable cord hyperintensity. This shows significant progression from the prior thoracic MRI of 03/26/2018 T11-12: Mild disc and facet degeneration. Mild foraminal narrowing bilaterally T12-L1: Small left paracentral disc protrusion and mild facet degeneration. Negative for stenosis L1-2: Diffuse bulging of the disc and bilateral facet hypertrophy. No significant stenosis. L2-3: Mild disc degeneration. Bilateral facet degeneration. Mild foraminal narrowing bilaterally L3-4: Diffuse  bulging of the disc and bilateral facet  degeneration. Mild subarticular stenosis bilaterally L4-5: Disc degeneration with disc bulging and moderate to advanced facet degeneration bilaterally. Mild to moderate spinal stenosis. Moderate subarticular stenosis bilaterally right greater than left. L5-S1: Disc degeneration with diffuse endplate spurring. Moderate left subarticular stenosis. IMPRESSION: 1. Patchy bone marrow edema T11 vertebral body without significant loss of height. Differential diagnosis includes mild fracture, edema related to disc degeneration, and tumor. Infection not considered likely. Recommend follow-up MRI of the thoracic spine without with contrast 2. Severe spinal stenosis with cord compression and cord hyperintensity at T10-11. Attention at follow-up MRI thoracic spine without with contrast. 3. Lumbar spine degenerative changes most prominent L4-5. Mild to moderate spinal stenosis L4-5 with moderate subarticular stenosis bilaterally right greater than left. 4. These results were called by telephone at the time of interpretation on 05/04/2020 at 10:03 am to provider [facility] , who verbally acknowledged these results. Electronically Signed   By: Franchot Gallo M.D.   On: 05/04/2020 10:04   MR THORACIC SPINE W WO CONTRAST  Result Date: 05/04/2020 CLINICAL DATA:  Mid back pain.  Abnormal MRI lumbar spine EXAM: MRI THORACIC WITHOUT AND WITH CONTRAST TECHNIQUE: Multiplanar and multiecho pulse sequences of the thoracic spine were obtained without and with intravenous contrast. CONTRAST:  81mL GADAVIST GADOBUTROL 1 MMOL/ML IV SOLN COMPARISON:  MRI lumbar spine May 04, 2020. FINDINGS: Alignment: Mildly exaggerated thoracic kyphosis. No substantial sagittal subluxation. Vertebrae: Multilevel degenerative endplate signal changes. As noted on same day MRI lumbar spine, there is patchy bone marrow edema involving the T11 vertebral body, which demonstrates patchy enhancement on post-contrast  imaging. Of note, there is also edema/enhancement of the inferior T10 vertebral body with severe degenerative disc disease at this level (as detailed below). Cord: Query subtle cord T2 hyperintensity at T9-T10 (series 22, image 7) without cord expansion Paraspinal and other soft tissues: Unremarkable. Disc levels: Degenerative disc height loss at T10-T11 with T11 superior Schmorl's node. There is also posterior disc bulge, facet hypertrophy and ligamentum flavum thickening at this level resulting in moderate canal stenosis and severe bilateral foraminal stenosis. Posterior disc/osteophyte complexes at nearly every thoracic level with mild to moderate canal stenosis at T2-T3, T3-T4, T4-T5 and T9-T10 and multilevel mild canal stenosis. At T9-T10, posterior osteophyte contacts and flattens the ventral cord There is multilevel facet hypertrophy with at least moderate foraminal stenosis bilaterally at T1-T2 and on the left at T2-T3, on the left at C2-C3 and on the right at T4-T5 and T5-T6 with multilevel mild-to-moderate foraminal stenosis. C7-T1 is not well imaged due to metallic artifact. Dedicated MRI of the cervical spine could better characterize the cervicothoracic junction if clinically indicated. IMPRESSION: 1. As noted on same day MRI lumbar spine, there is patchy bone marrow edema involving the T11 vertebral body, which demonstrates patchy enhancement on post-contrast imaging. Of note, there is also edema/enhancement of the inferior T10 vertebral body with severe degenerative disc disease at this level, suggesting that this process is degenerative/discogenic in etiology. 2. At T10-T11 there is moderate canal stenosis and severe bilateral foraminal stenosis. There is at least moderate foraminal stenosis bilaterally at T1-T2, on the left at T2-T3, and the right at T4-T5 and T5-T6 with multilevel mild-to-moderate foraminal stenosis. 3. Mild to moderate canal stenosis atT2-T3, T3-T4, T4-T5, and T9-T10. Query subtle  cord edema or myelomalacia at T9-T10 (series 22, image 7) where posterior osteophyte contacts and deforms the ventral cord. Electronically Signed   By: Margaretha Sheffield MD   On: 05/04/2020 12:43    Procedures  Procedures   Medications Ordered in ED Medications  morphine 2 MG/ML injection 2 mg (2 mg Intravenous Given 05/04/20 5537)    ED Course  I have reviewed the triage vital signs and the nursing notes.  Pertinent labs & imaging results that were available during my care of the patient were reviewed by me and considered in my medical decision making (see chart for details).    MDM Rules/Calculators/A&P                          MDM  Screen complete  NOLAN LASSER was evaluated in Emergency Department on 05/04/2020 for the symptoms described in the history of present illness. She was evaluated in the context of the global COVID-19 pandemic, which necessitated consideration that the patient might be at risk for infection with the SARS-CoV-2 virus that causes COVID-19. Institutional protocols and algorithms that pertain to the evaluation of patients at risk for COVID-19 are in a state of rapid change based on information released by regulatory bodies including the CDC and federal and state organizations. These policies and algorithms were followed during the patient's care in the ED.  Patient is presenting for evaluation of right leg pain.  Patient's exam is consistent with likely sciatica.  Patient without motor weakness.  Patient is experiencing gradually worsening pain to the right low back and right leg.  MRIs obtained are without significant acute pathology.  MRI images reviewed with radiology and Dr. Reatha Armour covering [facility].  Patient is appropriate for outpatient management.  Patient understands need for close follow-up with Dr. Reatha Armour.  Patient will be prescribed Medrol Dosepak and additional pain medication.  Patient and daughter understand plan of care.   Importance of close follow-up is stressed.   Final Clinical Impression(s) / ED Diagnoses Final diagnoses:  Right leg pain    Rx / DC Orders ED Discharge Orders    None       Valarie Merino, MD 05/04/20 1443

## 2020-05-04 NOTE — Progress Notes (Signed)
Was called by emergency physician, Dr. Francia Greaves, patient is complaining of right-sided low back pain, mid back pain and right lower extremity radiculopathy down to her toes.  She apparently denies any weakness whatsoever, has no weakness on exam and has no signs of myelopathy.  I reviewed the images over the phone with the ED physician.  I recommend he work on pain control for her and I will see her in a short follow-up in the next 1 to 2 weeks in my office.    Elwin Sleight, DO Neurosurgery

## 2020-05-04 NOTE — Discharge Instructions (Addendum)
Please return for any problem.   Follow up with Dr. Reatha Armour [facility]) as instructed.

## 2020-05-04 NOTE — ED Triage Notes (Signed)
Pt complains of right leg pain and back pain. She called her doctor who told her to come to ED for possible sciatica.

## 2020-05-09 ENCOUNTER — Ambulatory Visit: Payer: Medicare PPO | Admitting: Adult Health

## 2020-05-10 ENCOUNTER — Ambulatory Visit: Payer: BC Managed Care – PPO | Admitting: Adult Health

## 2020-05-17 DIAGNOSIS — N184 Chronic kidney disease, stage 4 (severe): Secondary | ICD-10-CM | POA: Diagnosis not present

## 2020-05-17 DIAGNOSIS — I129 Hypertensive chronic kidney disease with stage 1 through stage 4 chronic kidney disease, or unspecified chronic kidney disease: Secondary | ICD-10-CM | POA: Diagnosis not present

## 2020-05-17 DIAGNOSIS — E1122 Type 2 diabetes mellitus with diabetic chronic kidney disease: Secondary | ICD-10-CM | POA: Diagnosis not present

## 2020-05-18 DIAGNOSIS — M5432 Sciatica, left side: Secondary | ICD-10-CM | POA: Diagnosis not present

## 2020-05-18 DIAGNOSIS — E1122 Type 2 diabetes mellitus with diabetic chronic kidney disease: Secondary | ICD-10-CM | POA: Diagnosis not present

## 2020-05-18 DIAGNOSIS — Z7189 Other specified counseling: Secondary | ICD-10-CM | POA: Diagnosis not present

## 2020-05-18 DIAGNOSIS — I10 Essential (primary) hypertension: Secondary | ICD-10-CM | POA: Diagnosis not present

## 2020-05-19 ENCOUNTER — Other Ambulatory Visit: Payer: Self-pay | Admitting: Family Medicine

## 2020-05-19 DIAGNOSIS — Z1231 Encounter for screening mammogram for malignant neoplasm of breast: Secondary | ICD-10-CM

## 2020-05-26 DIAGNOSIS — M5431 Sciatica, right side: Secondary | ICD-10-CM | POA: Diagnosis not present

## 2020-06-01 ENCOUNTER — Other Ambulatory Visit: Payer: Self-pay

## 2020-06-01 ENCOUNTER — Encounter (HOSPITAL_COMMUNITY): Payer: Self-pay

## 2020-06-01 ENCOUNTER — Emergency Department (HOSPITAL_COMMUNITY)
Admission: EM | Admit: 2020-06-01 | Discharge: 2020-06-01 | Disposition: A | Discharge status: Home / Self Care | Payer: Medicare Other | Attending: Emergency Medicine | Admitting: Emergency Medicine

## 2020-06-01 ENCOUNTER — Emergency Department (HOSPITAL_COMMUNITY): Payer: Medicare Other

## 2020-06-01 DIAGNOSIS — I10 Essential (primary) hypertension: Secondary | ICD-10-CM | POA: Insufficient documentation

## 2020-06-01 DIAGNOSIS — M47895 Other spondylosis, thoracolumbar region: Secondary | ICD-10-CM

## 2020-06-01 DIAGNOSIS — Z7984 Long term (current) use of oral hypoglycemic drugs: Secondary | ICD-10-CM | POA: Insufficient documentation

## 2020-06-01 DIAGNOSIS — R32 Unspecified urinary incontinence: Secondary | ICD-10-CM | POA: Diagnosis not present

## 2020-06-01 DIAGNOSIS — N179 Acute kidney failure, unspecified: Secondary | ICD-10-CM | POA: Diagnosis not present

## 2020-06-01 DIAGNOSIS — Z7982 Long term (current) use of aspirin: Secondary | ICD-10-CM | POA: Insufficient documentation

## 2020-06-01 DIAGNOSIS — J45909 Unspecified asthma, uncomplicated: Secondary | ICD-10-CM | POA: Diagnosis not present

## 2020-06-01 DIAGNOSIS — M545 Low back pain, unspecified: Secondary | ICD-10-CM | POA: Diagnosis not present

## 2020-06-01 DIAGNOSIS — Z794 Long term (current) use of insulin: Secondary | ICD-10-CM | POA: Insufficient documentation

## 2020-06-01 DIAGNOSIS — Z79899 Other long term (current) drug therapy: Secondary | ICD-10-CM | POA: Diagnosis not present

## 2020-06-01 DIAGNOSIS — E119 Type 2 diabetes mellitus without complications: Secondary | ICD-10-CM | POA: Insufficient documentation

## 2020-06-01 DIAGNOSIS — M546 Pain in thoracic spine: Secondary | ICD-10-CM | POA: Diagnosis not present

## 2020-06-01 DIAGNOSIS — M4804 Spinal stenosis, thoracic region: Secondary | ICD-10-CM

## 2020-06-01 DIAGNOSIS — M549 Dorsalgia, unspecified: Secondary | ICD-10-CM

## 2020-06-01 MED ORDER — OXYCODONE-ACETAMINOPHEN 5-325 MG PO TABS
1.0000 | ORAL_TABLET | Freq: Once | ORAL | Status: AC
  Administered 2020-06-01: 1 via ORAL
  Filled 2020-06-01: qty 1

## 2020-06-01 MED ORDER — OXYCODONE-ACETAMINOPHEN 5-325 MG PO TABS: 1.0000 | ORAL_TABLET | ORAL | 0 refills | Status: DC | PRN

## 2020-06-01 NOTE — ED Provider Notes (Signed)
[facility] DEPT Provider Note   CSN: [id number] Arrival date & time: 06/01/20  1303     History Chief Complaint  Patient presents with  . Back Pain    [name] is a 80 y.o. female.  HPI Patient presents for continued pain in her back which is mostly right lower, and has been unresponsive to treatments so far.  She was seen and evaluated for the same problem on 05/04/2020 in the emergency department.  At that time she had imaging of the lumbar and thoracic spines.  Findings were for significant degenerative changes with spinal stenosis.  Neurosurgery was contacted, and the patient was instructed to follow-up with him in 1 or 2 weeks.  She did not do that.  She did follow-up with an orthopedic doctor, 1 week ago at which time she received "a shot in both of my hips."  She experienced brief transient partial recovery after the injections but now the pain has returned.  She has ongoing urinary and stool incontinence that has been present for 2 weeks.  She continues to be able to walk.  She apparently also completed a methylprednisolone taper and treatment with oxycodone, following the ED visit on 317/22.  She is Deal[employer] and came here by private vehicle today.  Denies fever, chills, nausea, vomiting.  There are no other known modifying factors.    Past Medical History:  Diagnosis Date  . ACS (acute coronary syndrome) ([place]) 12/27/2010  . AKI (acute kidney injury) ([place]) 03/25/2018  . Anginal pain ([place])   . Arthritis   . Asthma   . Diabetes mellitus    Type 1  . Dysrhythmia    "irregular heart beat"  . GERD (gastroesophageal reflux disease)   . History of urinary tract infection   . Hypercholesteremia   . Hypertension   . left renal ca dx'd 2012 (?)   surg only, left kidney  . Nocturia   . Reflux   . Renal failure (ARF), acute on chronic (HCC)   . Shortness of breath dyspnea    pt denies; states can climb stairs w/o difficulty   . Sleep apnea     does not use c-pap machine  . Tingling in extremities    legs bilat  . Tinnitus   . Urinary frequency   . Urinary incontinence   . Vertigo    occurs when lying flat     Patient Active Problem List   Diagnosis Date Noted  . Pain due to onychomycosis of toenails of both feet 09/01/2018  . Left shoulder pain   . Limited joint range of motion (ROM)   . Chest pain 10/23/2015  . Diabetes mellitus type 2 in obese ([place]) 10/23/2015  . Chest pain with high risk for cardiac etiology 07/28/2014  . Diabetes mellitus 12/27/2010  . Hypertension 12/27/2010  . Hyperlipidemia 12/27/2010  . GERD (gastroesophageal reflux disease) 12/27/2010  . Renal cell adenocarcinoma ([place]) 12/27/2010  . Arthritis 12/27/2010  . Cervical stenosis of spine 12/27/2010  . Carpal tunnel syndrome 12/27/2010  . Asthma 12/27/2010    Past Surgical History:  Procedure Laterality Date  . ABDOMINAL HYSTERECTOMY    . APPENDECTOMY    . CARDIAC CATHETERIZATION N/A 07/28/2014   Procedure: Left Heart Cath and Coronary Angiography;  Surgeon: [name], MD;  Location: [facility];  Service: Cardiovascular;  Laterality: N/A;  . CYSTOSCOPY WITH RETROGRADE PYELOGRAM, URETEROSCOPY AND STENT PLACEMENT Right 02/09/2015   Procedure: CYSTOSCOPY WITH RETROGRADE PYELOGRAM AND URETEROSCOPY ;  Surgeon: Raynelle Bring, MD;  Location: [facility];  Service: Urology;  Laterality: Right;  . LAPAROSCOPIC NEPHRECTOMY Right 03/20/2015   Procedure: LAPAROSCOPIC NEPHRECTOMY;  Surgeon: Raynelle Bring, MD;  Location: [facility];  Service: Urology;  Laterality: Right;  . PERIPHERAL VASCULAR CATHETERIZATION N/A 07/28/2014   Procedure: Aortic Arch Angiography;  Surgeon: [name], MD;  Location: [facility];  Service: Cardiovascular;  Laterality: N/A;  . RENAL MASS EXCISION    . SPINE SURGERY       OB History   No obstetric history on file.     Family History  Problem Relation Age of Onset  . Hypertension Mother   . Coronary artery disease Other    . Breast cancer Cousin     Social History   Tobacco Use  . Smoking status: Never Smoker  . Smokeless tobacco: Never Used  Vaping Use  . Vaping Use: Never used  Substance Use Topics  . Alcohol use: No  . Drug use: No    Home Medications Prior to Admission medications   Medication Sig Start Date End Date Taking? Authorizing Provider  acetaminophen (TYLENOL 8 HOUR) 650 MG CR tablet Take 1 tablet (650 mg total) by mouth every 8 (eight) hours as needed for pain. 02/18/20   Suzy Bouchard, PA-C  albuterol (PROVENTIL,VENTOLIN) 90 MCG/ACT inhaler Inhale 2 puffs into the lungs every 4 (four) hours as needed for wheezing or shortness of breath.     [provider]  amLODipine (NORVASC) 5 MG tablet TAKE 1 TABLET BY MOUTH EVERY DAY 06/28/19   Patwardhan, Manish J, MD  aspirin EC 81 MG tablet Take 81 mg by mouth at bedtime.    [provider]  atorvastatin (LIPITOR) 10 MG tablet Take 10 mg by mouth every morning.     [provider]  cholecalciferol (VITAMIN D) 1000 units tablet Take 1,000 Units by mouth daily.    [provider]  donepezil (ARICEPT) 10 MG tablet Take 1 tablet (10 mg total) by mouth at bedtime. 11/09/19   Star Age, MD  gabapentin (NEURONTIN) 300 MG capsule Take 1 capsule (300 mg total) by mouth at bedtime. 07/16/16   Wallene Huh, DPM  HYDROcodone-acetaminophen (NORCO) 10-325 MG tablet Take 1 tablet by mouth daily as needed for pain. 06/01/20   [provider]  Insulin Degludec (TRESIBA FLEXTOUCH) 200 UNIT/ML SOPN Inject 78 Units into the skin daily before breakfast.     [provider]  lidocaine (LIDODERM) 5 % Place 1 patch onto the skin daily. Remove & Discard patch within 12 hours or as directed by MD 03/27/18   Reyne Dumas, MD  methocarbamol (ROBAXIN) 500 MG tablet Take 1 tablet (500 mg total) by mouth 2 (two) times daily. 02/18/20   Suzy Bouchard, PA-C  methylPREDNISolone (MEDROL DOSEPAK) 4 MG TBPK tablet  Take as instructed 05/04/20   Valarie Merino, MD  metoprolol succinate (TOPROL-XL) 100 MG 24 hr tablet TAKE 1 TABLET (100 MG TOTAL) BY MOUTH DAILY. PT NEEDS TO SCHEDULE F/U APPT FOR FURTHER REFILLS. 01/05/20   [name], MD  mirabegron ER (MYRBETRIQ) 50 MG TB24 tablet Take 50 mg by mouth daily.    [provider]  Multiple Vitamins-Minerals (CENTRUM SILVER ADULT 50+ PO) Take 1 tablet by mouth daily.    [provider]  nitroGLYCERIN (NITROSTAT) 0.4 MG SL tablet Place 0.4 mg under the tongue every 5 (five) minutes as needed for chest pain.    [provider]  oxyCODONE (  ROXICODONE) 5 MG immediate release tablet Take 1 tablet (5 mg total) by mouth every 6 (six) hours as needed for severe pain. 05/04/20   Valarie Merino, MD  pantoprazole (PROTONIX) 40 MG tablet Take 40 mg by mouth daily as needed (heartburn).  03/25/19   [provider]  TRADJENTA 5 MG TABS tablet Take 5 mg by mouth daily. 06/24/19   [provider]    Allergies    Sulfa antibiotics  Review of Systems   Review of Systems  All other systems reviewed and are negative.   Physical Exam Updated Vital Signs BP (!) 114/94 (BP Location: Left Arm)   Pulse 66   Temp 98.4 F (36.9 C) (Oral)   Resp 17   SpO2 100%   Physical Exam Vitals and nursing note reviewed.  Constitutional:      General: She is not in acute distress.    Appearance: She is well-developed. She is not ill-appearing, toxic-appearing or diaphoretic.  HENT:     Head: Normocephalic and atraumatic.     Right Ear: External ear normal.     Left Ear: External ear normal.  Eyes:     Conjunctiva/sclera: Conjunctivae normal.     Pupils: Pupils are equal, round, and reactive to light.  Neck:     Trachea: Phonation normal.  Cardiovascular:     Rate and Rhythm: Normal rate.  Pulmonary:     Effort: Pulmonary effort is normal.  Abdominal:     General: There is no distension.  Genitourinary:    Comments: Anal wink  present, anal tone normal. Musculoskeletal:        General: Normal range of motion.     Cervical back: Normal range of motion and neck supple.     Comments: Normal strength, arms and legs bilaterally.  Skin:    General: Skin is warm and dry.  Neurological:     Mental Status: She is alert and oriented to person, place, and time.     Cranial Nerves: No cranial nerve deficit.     Sensory: No sensory deficit.     Motor: No abnormal muscle tone.     Coordination: Coordination normal.     Comments: Intact perineal sensation.  Psychiatric:        Mood and Affect: Mood normal.        Behavior: Behavior normal.        Thought Content: Thought content normal.        Judgment: Judgment normal.     ED Results / Procedures / Treatments   Labs (all labs ordered are listed, but only abnormal results are displayed) Labs Reviewed - No data to display  EKG None  Radiology No results found.  Procedures Procedures   Medications Ordered in ED Medications  oxyCODONE-acetaminophen (PERCOCET/ROXICET) 5-325 MG per tablet 1 tablet (has no administration in time range)    ED Course  I have reviewed the triage vital signs and the nursing notes.  Pertinent labs & imaging results that were available during my care of the patient were reviewed by me and considered in my medical decision making (see chart for details).    MDM Rules/Calculators/A&P                           Patient Vitals for the past 24 hrs:  BP Temp Temp src Pulse Resp SpO2  06/01/20 1425 (!) 114/94 -- -- 66 17 100 %  06/01/20 1311 (!) 160/76 98.4  F (36.9 C) Oral 79 16 98 %    5:08 PM Reevaluation with update and discussion. After initial assessment and treatment, an updated evaluation reveals patient states she has not had any relief after the Percocet.  She does appear to be resting comfortably.  I discussed the findings with the patient and her son, Joneen Caraway, by telephone.  He is comfortable with the treatment plan.  He  will help patient contact neurosurgery tomorrow morning to schedule the appointment.  He understands patient may need to return here for further care and treatment if needed, some she cannot get help from neurosurgery determine manner, or her PCP if needed.  All questions answered. Daleen Bo   Medical Decision Making:  This patient is presenting for evaluation of back pain with spinal stenosis and bowel/bladder incontinence, which does require a range of treatment options, and is a complaint that involves a high risk of morbidity and mortality. The differential diagnoses include degenerative spine disorder, spinal myelopathy. I decided to review old records, and in summary 80 year old female, with chronic and worsening back pain despite multiple treatments, presenting now with persistent symptoms and concern clinically for spinal myelopathy..  I did not require additional historical information from anyone.    MRI results 05/04/20:  IMPRESSION: 1. As noted on same day MRI lumbar spine, there is patchy bone marrow edema involving the T11 vertebral body, which demonstrates patchy enhancement on post-contrast imaging. Of note, there is also edema/enhancement of the inferior T10 vertebral body with severe degenerative disc disease at this level, suggesting that this process is degenerative/discogenic in etiology. 2. At T10-T11 there is moderate canal stenosis and severe bilateral foraminal stenosis. There is at least moderate foraminal stenosis bilaterally at T1-T2, on the left at T2-T3, and the right at T4-T5 and T5-T6 with multilevel mild-to-moderate foraminal stenosis. 3. Mild to moderate canal stenosis atT2-T3, T3-T4, T4-T5, and T9-T10. Query subtle cord edema or myelomalacia at T9-T10 (series 22, image 7) where posterior osteophyte contacts and deforms the ventral cord.  Critical Interventions-clinical evaluation, medication treatment, radiography, discussion with neurosurgery,  observation and reassessment  After These Interventions, the Patient was reevaluated and was found stable for discharge.  Patient with nonspecific pain, significant spine arthritis, without spinal myelopathy.  Stable for discharge, with outpatient management.  She has been referred to neurosurgery, family has been contacted to help arrange neurosurgery follow-up appointment.  Patient was previously referred, about 1 month ago but did not go see neurosurgery at that time.  X-ray of thoracic spine done today to evaluate for acute compression fracture contributing to pain.  No evidence for acute compression fracture at this time.  CRITICAL CARE-no Performed by: Daleen Bo  Nursing Notes Reviewed/ Care Coordinated Applicable Imaging Reviewed Interpretation of Laboratory Data incorporated into ED treatment  The patient appears reasonably screened and/or stabilized for discharge and I doubt any other medical condition or other [facility] requiring further screening, evaluation, or treatment in the ED at this time prior to discharge.  Plan: Home Medications-continue current; Home Treatments-rest, fluids; return here if the recommended treatment, does not improve the symptoms; Recommended follow up-neurosurgery follow-up ASAP     Final Clinical Impression(s) / ED Diagnoses Final diagnoses:  None    Rx / DC Orders ED Discharge Orders    None       Daleen Bo, MD 06/01/20 1718

## 2020-06-01 NOTE — Discharge Instructions (Signed)
We are prescribing Percocet to help your discomfort.  Be careful when taking it to avoid sedation, and constipation.  You may need to take a stool softener if you are having trouble moving her bowels.  Call Dr. Reatha Armour, the neurosurgeon, tomorrow morning to schedule a follow-up appointment.  Return here, if needed.

## 2020-06-01 NOTE — ED Triage Notes (Signed)
Pt arrived via walk in, c/o back pain. Was seen previously, dx with sciatica. Followed up with orthopedics, given IM injections to help with pain last week, helped for the time being, pain back since.

## 2020-06-08 DIAGNOSIS — M48061 Spinal stenosis, lumbar region without neurogenic claudication: Secondary | ICD-10-CM | POA: Diagnosis not present

## 2020-06-08 DIAGNOSIS — M5416 Radiculopathy, lumbar region: Secondary | ICD-10-CM | POA: Diagnosis not present

## 2020-06-12 DIAGNOSIS — E1122 Type 2 diabetes mellitus with diabetic chronic kidney disease: Secondary | ICD-10-CM | POA: Diagnosis not present

## 2020-06-12 DIAGNOSIS — I10 Essential (primary) hypertension: Secondary | ICD-10-CM | POA: Diagnosis not present

## 2020-06-12 DIAGNOSIS — N184 Chronic kidney disease, stage 4 (severe): Secondary | ICD-10-CM | POA: Diagnosis not present

## 2020-06-12 DIAGNOSIS — E1165 Type 2 diabetes mellitus with hyperglycemia: Secondary | ICD-10-CM | POA: Diagnosis not present

## 2020-06-20 DIAGNOSIS — N184 Chronic kidney disease, stage 4 (severe): Secondary | ICD-10-CM | POA: Diagnosis not present

## 2020-06-20 DIAGNOSIS — I129 Hypertensive chronic kidney disease with stage 1 through stage 4 chronic kidney disease, or unspecified chronic kidney disease: Secondary | ICD-10-CM | POA: Diagnosis not present

## 2020-06-20 DIAGNOSIS — E119 Type 2 diabetes mellitus without complications: Secondary | ICD-10-CM | POA: Diagnosis not present

## 2020-06-20 DIAGNOSIS — E1122 Type 2 diabetes mellitus with diabetic chronic kidney disease: Secondary | ICD-10-CM | POA: Diagnosis not present

## 2020-06-20 DIAGNOSIS — D631 Anemia in chronic kidney disease: Secondary | ICD-10-CM | POA: Diagnosis not present

## 2020-07-02 ENCOUNTER — Other Ambulatory Visit: Payer: Self-pay | Admitting: Cardiology

## 2020-07-02 DIAGNOSIS — I10 Essential (primary) hypertension: Secondary | ICD-10-CM

## 2020-07-10 ENCOUNTER — Other Ambulatory Visit: Payer: Self-pay

## 2020-07-10 ENCOUNTER — Ambulatory Visit
Admission: RE | Admit: 2020-07-10 | Discharge: 2020-07-10 | Disposition: A | Discharge status: Home / Self Care | Payer: Medicare Other | Source: Ambulatory Visit | Attending: Family Medicine | Admitting: Family Medicine

## 2020-07-10 DIAGNOSIS — Z1231 Encounter for screening mammogram for malignant neoplasm of breast: Secondary | ICD-10-CM

## 2020-08-07 DIAGNOSIS — I129 Hypertensive chronic kidney disease with stage 1 through stage 4 chronic kidney disease, or unspecified chronic kidney disease: Secondary | ICD-10-CM | POA: Diagnosis not present

## 2020-08-07 DIAGNOSIS — I10 Essential (primary) hypertension: Secondary | ICD-10-CM | POA: Diagnosis not present

## 2020-08-07 DIAGNOSIS — K219 Gastro-esophageal reflux disease without esophagitis: Secondary | ICD-10-CM | POA: Diagnosis not present

## 2020-08-07 DIAGNOSIS — E1122 Type 2 diabetes mellitus with diabetic chronic kidney disease: Secondary | ICD-10-CM | POA: Diagnosis not present

## 2020-08-07 DIAGNOSIS — N184 Chronic kidney disease, stage 4 (severe): Secondary | ICD-10-CM | POA: Diagnosis not present

## 2020-08-17 DIAGNOSIS — I129 Hypertensive chronic kidney disease with stage 1 through stage 4 chronic kidney disease, or unspecified chronic kidney disease: Secondary | ICD-10-CM | POA: Diagnosis not present

## 2020-08-17 DIAGNOSIS — E1122 Type 2 diabetes mellitus with diabetic chronic kidney disease: Secondary | ICD-10-CM | POA: Diagnosis not present

## 2020-08-17 DIAGNOSIS — N184 Chronic kidney disease, stage 4 (severe): Secondary | ICD-10-CM | POA: Diagnosis not present

## 2020-08-17 DIAGNOSIS — Z794 Long term (current) use of insulin: Secondary | ICD-10-CM | POA: Diagnosis not present

## 2020-08-20 IMAGING — MR MR ABDOMEN W/O CM
7 of 8 series · 35 of 48 positions shown · non-contrast
Comparison: Abdominopelvic CT 06/01/2017. Abdominal MRI 11/15/2016.

CLINICAL DATA: Abdominal pain radiating into the lower back for 1
month. History of renal cell carcinoma post nephrectomy 03/20/2015.

EXAM:
MRI ABDOMEN WITHOUT CONTRAST
TECHNIQUE: Multiplanar multisequence MR imaging was performed without the
administration of intravenous contrast.

[Series 3: T2 · axial · 5.0mm · 0.74mm/px · z∈[+19,+253]mm · 4 of 40 slices shown (1 of 3)]
[im 1/40]
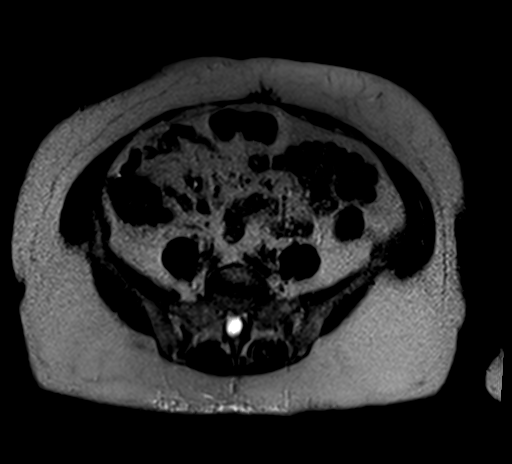
[im 14/40]
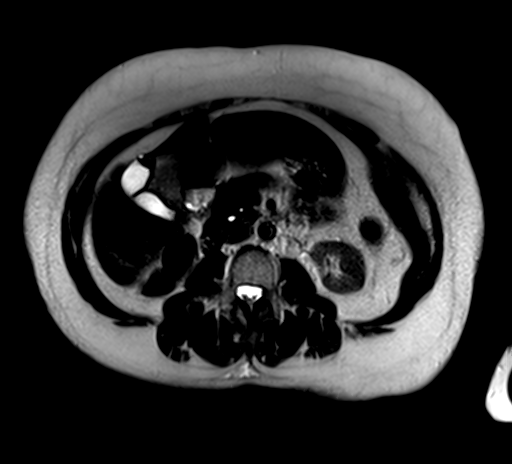
[im 27/40]
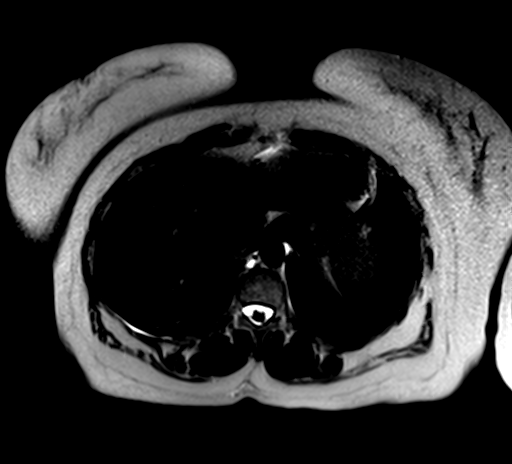
[im 40/40]
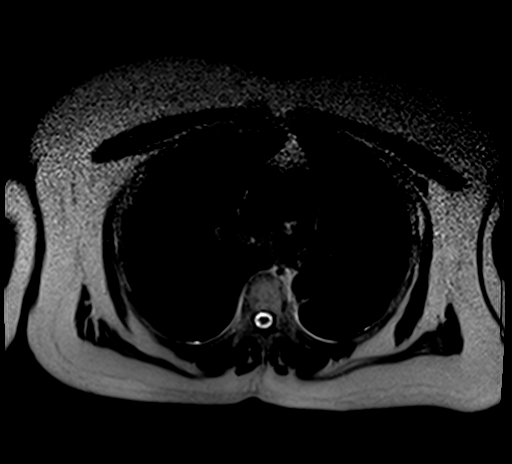

[Series 4: T1 · axial · 5.0mm · 0.74mm/px · z∈[+55,+217]mm · 7 of 56 slices shown]
[im 1/56]
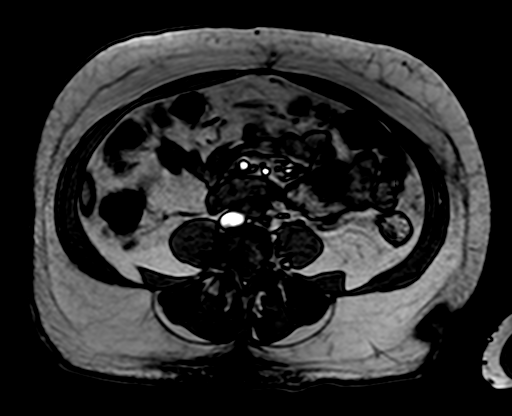
[im 10/56]
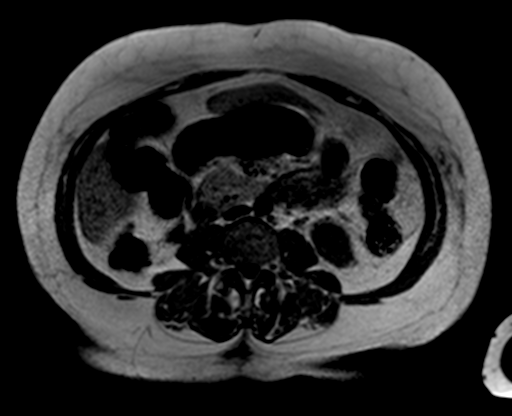
[im 19/56]
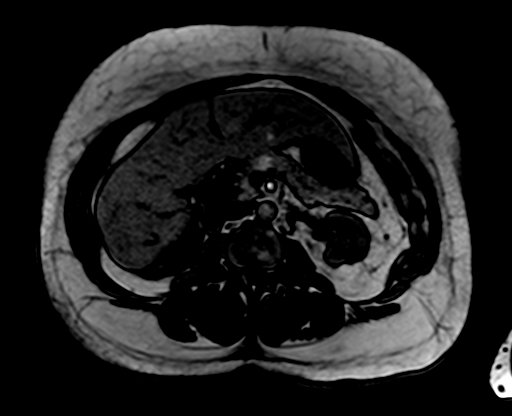
[im 28/56]
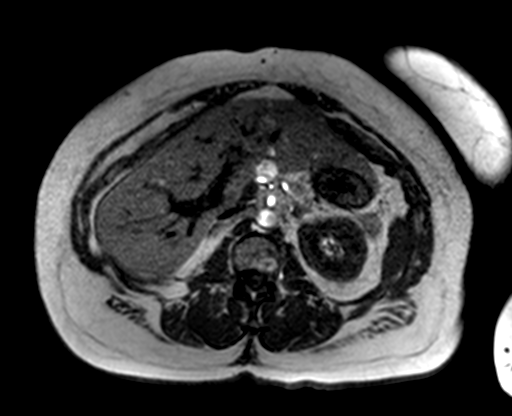
[im 37/56]
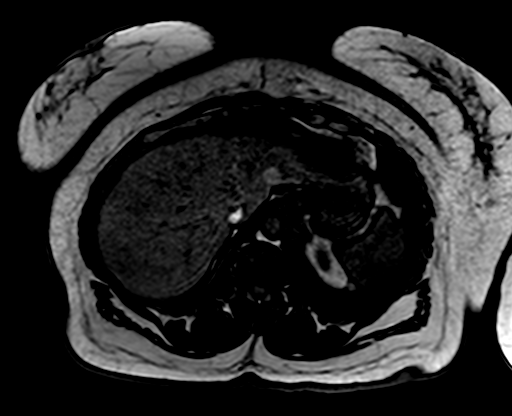
[im 46/56]
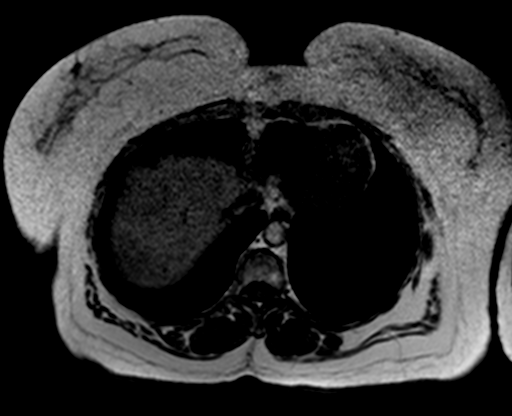
[im 56/56]
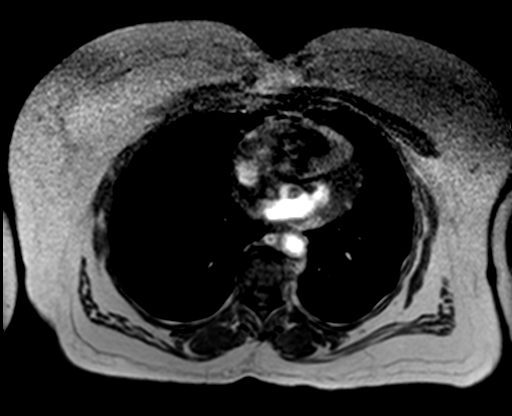

[Series 5: bSSFP · axial · 4.0mm · 0.74mm/px · z∈[+51,+199]mm · 5 of 38 slices shown]
[im 1/38]
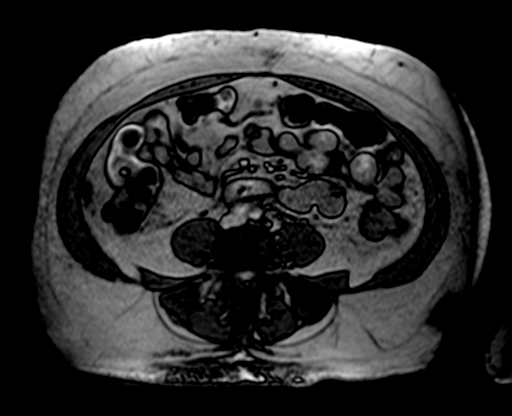
[im 10/38]
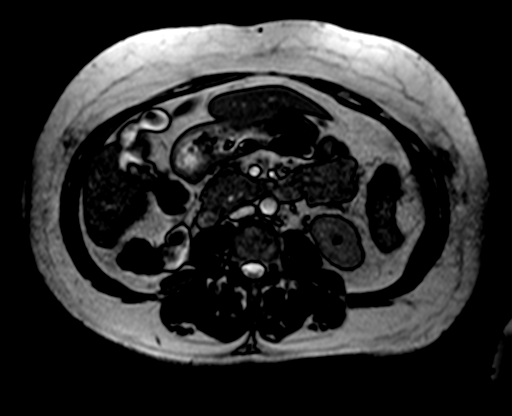
[im 19/38]
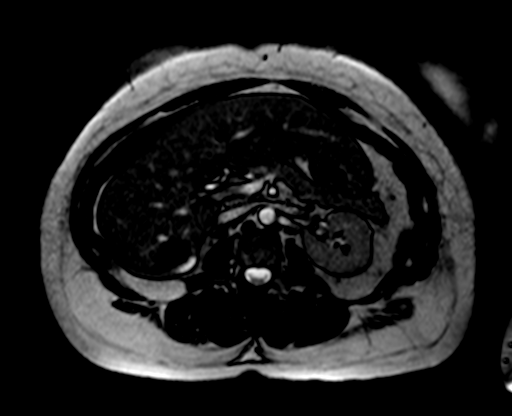
[im 28/38]
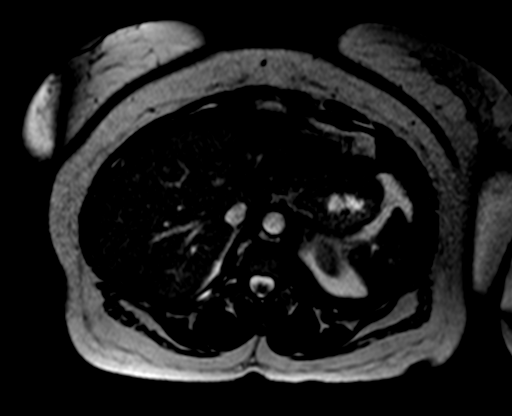
[im 38/38]
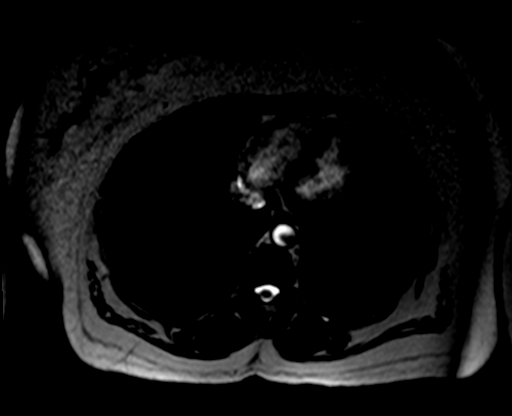

[Series 6: T2 · coronal · 5.0mm · 0.78mm/px · 3 of 22 slices shown (2 of 3)]
[im 1/22]
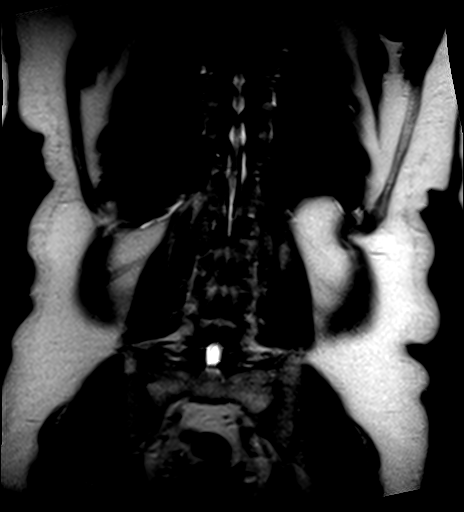
[im 11/22]
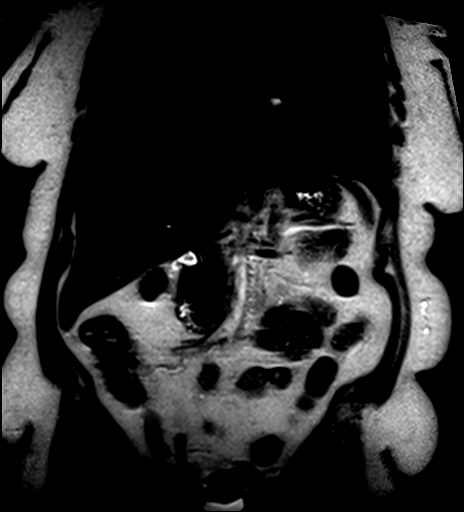
[im 22/22]
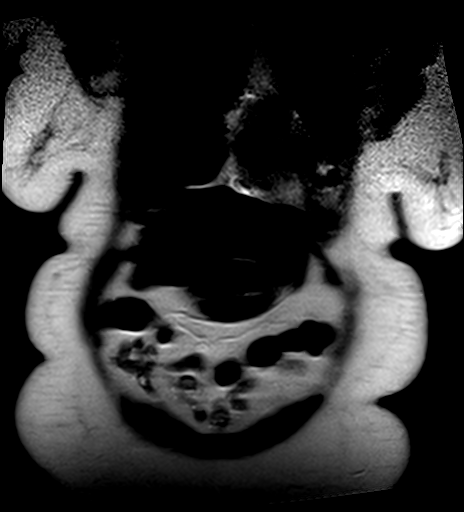

[Series 7: T2 · axial · 5.0mm · 1.33mm/px · z∈[+64,+250]mm · 4 of 32 slices shown (3 of 3)]
[im 1/32]
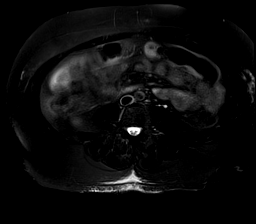
[im 11/32]
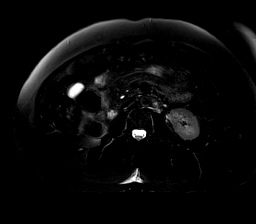
[im 21/32]
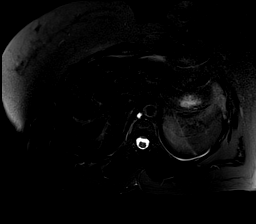
[im 32/32]
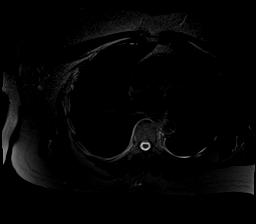

[Series 8: ep2d_[id number]_500_800_p2_trig · axial · 5.0mm · 1.98mm/px · z∈[+64,+250]mm · 8 of 96 slices shown]
[im 1/96]
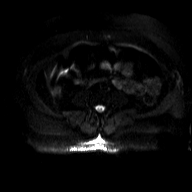
[im 18/96]
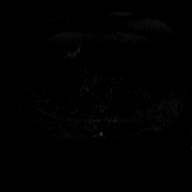
[im 26/96]
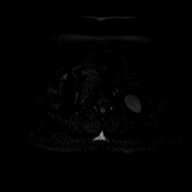
[im 44/96]
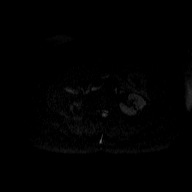
[im 52/96]
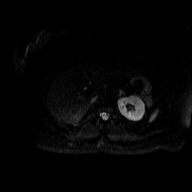
[im 70/96]
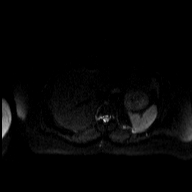
[im 78/96]
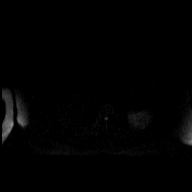
[im 96/96]
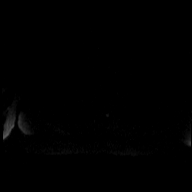

[Series 9: ep2d_[id number]_800_p2_trig_adc · axial · 5.0mm · 1.98mm/px · z∈[+64,+250]mm · 4 of 32 slices shown]
[im 1/32]
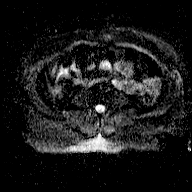
[im 11/32]
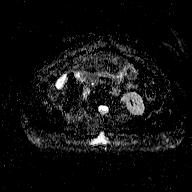
[im 21/32]
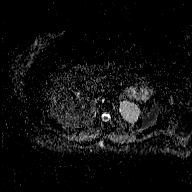
[im 32/32]
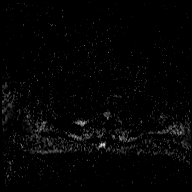

[35 of 48 positions shown; findings below may reference images not displayed]

FINDINGS: Lower chest:  The visualized lower chest appears unremarkable.

Hepatobiliary: No significant residual hepatic steatosis. No focal
lesion identified on noncontrast imaging. No evidence of gallstones,
gallbladder wall thickening or biliary dilatation.

Pancreas: Unremarkable. No pancreatic ductal dilatation or
surrounding inflammatory changes.

Spleen: Normal in size without focal abnormality.

Adrenals/Urinary Tract: Both adrenal glands appear normal. Status
post right nephrectomy. There are postsurgical changes in the mid
left kidney consistent with partial left nephrectomy. No evidence of
recurrent renal mass on noncontrast imaging. There is no
hydronephrosis.

Stomach/Bowel: No evidence of bowel wall thickening, distention or
surrounding inflammatory change.

Vascular/Lymphatic: There are no enlarged abdominal lymph nodes. No
significant vascular findings.

Other: The visualized abdominal wall is intact.  No ascites.

Musculoskeletal: No acute or significant osseous findings.
Degenerative disc disease and possible ankylosis in the midthoracic
spine.
IMPRESSION: 1. Stable examination status post right nephrectomy and partial left
nephrectomy. No evidence of local recurrence or metastatic disease.
2. No acute findings or explanation for the patient's symptoms.
3. Resolution of previously demonstrated hepatic steatosis.

## 2020-08-22 DIAGNOSIS — E1122 Type 2 diabetes mellitus with diabetic chronic kidney disease: Secondary | ICD-10-CM | POA: Diagnosis not present

## 2020-08-22 DIAGNOSIS — N184 Chronic kidney disease, stage 4 (severe): Secondary | ICD-10-CM | POA: Diagnosis not present

## 2020-08-22 DIAGNOSIS — R509 Fever, unspecified: Secondary | ICD-10-CM | POA: Diagnosis not present

## 2020-08-22 DIAGNOSIS — I10 Essential (primary) hypertension: Secondary | ICD-10-CM | POA: Diagnosis not present

## 2020-08-22 DIAGNOSIS — R519 Headache, unspecified: Secondary | ICD-10-CM | POA: Diagnosis not present

## 2020-08-22 DIAGNOSIS — R079 Chest pain, unspecified: Secondary | ICD-10-CM | POA: Diagnosis not present

## 2020-08-22 DIAGNOSIS — J069 Acute upper respiratory infection, unspecified: Secondary | ICD-10-CM | POA: Diagnosis not present

## 2020-08-27 ENCOUNTER — Encounter (HOSPITAL_COMMUNITY): Payer: Self-pay | Admitting: Emergency Medicine

## 2020-08-27 ENCOUNTER — Emergency Department (HOSPITAL_COMMUNITY): Payer: Medicare Other

## 2020-08-27 ENCOUNTER — Other Ambulatory Visit: Payer: Self-pay

## 2020-08-27 ENCOUNTER — Emergency Department (HOSPITAL_COMMUNITY)
Admission: EM | Admit: 2020-08-27 | Discharge: 2020-08-27 | Disposition: A | Discharge status: Home / Self Care | Payer: Medicare Other | Attending: Emergency Medicine | Admitting: Emergency Medicine

## 2020-08-27 DIAGNOSIS — U071 COVID-19: Secondary | ICD-10-CM | POA: Insufficient documentation

## 2020-08-27 DIAGNOSIS — Z7984 Long term (current) use of oral hypoglycemic drugs: Secondary | ICD-10-CM | POA: Insufficient documentation

## 2020-08-27 DIAGNOSIS — N179 Acute kidney failure, unspecified: Secondary | ICD-10-CM | POA: Diagnosis not present

## 2020-08-27 DIAGNOSIS — E119 Type 2 diabetes mellitus without complications: Secondary | ICD-10-CM | POA: Diagnosis not present

## 2020-08-27 DIAGNOSIS — Z85528 Personal history of other malignant neoplasm of kidney: Secondary | ICD-10-CM | POA: Diagnosis not present

## 2020-08-27 DIAGNOSIS — R0602 Shortness of breath: Secondary | ICD-10-CM

## 2020-08-27 DIAGNOSIS — Z7951 Long term (current) use of inhaled steroids: Secondary | ICD-10-CM | POA: Insufficient documentation

## 2020-08-27 DIAGNOSIS — Z79899 Other long term (current) drug therapy: Secondary | ICD-10-CM | POA: Insufficient documentation

## 2020-08-27 DIAGNOSIS — Z7982 Long term (current) use of aspirin: Secondary | ICD-10-CM | POA: Diagnosis not present

## 2020-08-27 DIAGNOSIS — I10 Essential (primary) hypertension: Secondary | ICD-10-CM | POA: Diagnosis not present

## 2020-08-27 DIAGNOSIS — R059 Cough, unspecified: Secondary | ICD-10-CM | POA: Diagnosis not present

## 2020-08-27 DIAGNOSIS — R110 Nausea: Secondary | ICD-10-CM | POA: Insufficient documentation

## 2020-08-27 DIAGNOSIS — J45909 Unspecified asthma, uncomplicated: Secondary | ICD-10-CM | POA: Diagnosis not present

## 2020-08-27 DIAGNOSIS — Z794 Long term (current) use of insulin: Secondary | ICD-10-CM | POA: Insufficient documentation

## 2020-08-27 LAB — CBC WITH DIFFERENTIAL/PLATELET
Abs Immature Granulocytes: 0.01 10*3/uL (ref 0.00–0.07)
Basophils Absolute: 0 10*3/uL (ref 0.0–0.1)
Basophils Relative: 0 %
Eosinophils Absolute: 0 10*3/uL (ref 0.0–0.5)
Eosinophils Relative: 1 %
HCT: 42.5 % (ref 36.0–46.0)
Hemoglobin: 13.8 g/dL (ref 12.0–15.0)
Immature Granulocytes: 0 %
Lymphocytes Relative: 29 %
Lymphs Abs: 1.2 10*3/uL (ref 0.7–4.0)
MCH: 30.9 pg (ref 26.0–34.0)
MCHC: 32.5 g/dL (ref 30.0–36.0)
MCV: 95.3 fL (ref 80.0–100.0)
Monocytes Absolute: 0.4 10*3/uL (ref 0.1–1.0)
Monocytes Relative: 9 %
Neutro Abs: 2.5 10*3/uL (ref 1.7–7.7)
Neutrophils Relative %: 61 %
Platelets: 244 10*3/uL (ref 150–400)
RBC: 4.46 MIL/uL (ref 3.87–5.11)
RDW: 13.5 % (ref 11.5–15.5)
WBC: 4.2 10*3/uL (ref 4.0–10.5)
nRBC: 0 % (ref 0.0–0.2)

## 2020-08-27 LAB — URINALYSIS, ROUTINE W REFLEX MICROSCOPIC
Bacteria, UA: NONE SEEN
Bilirubin Urine: NEGATIVE
Glucose, UA: >=500 mg/dL — AB
Hgb urine dipstick: NEGATIVE
Ketones, ur: NEGATIVE mg/dL
Leukocytes,Ua: NEGATIVE
Nitrite: NEGATIVE
Protein, ur: 30 mg/dL — AB
Renal Epithelial: 1
Specific Gravity, Urine: 1.02 (ref 1.005–1.030)
pH: 6 (ref 5.0–8.0)

## 2020-08-27 LAB — COMPREHENSIVE METABOLIC PANEL
ALT: 24 U/L (ref 0–44)
AST: 14 U/L — ABNORMAL LOW (ref 15–41)
Albumin: 4 g/dL (ref 3.5–5.0)
Alkaline Phosphatase: 72 U/L (ref 38–126)
Anion gap: 7 (ref 5–15)
BUN: 27 mg/dL — ABNORMAL HIGH (ref 8–23)
CO2: 27 mmol/L (ref 22–32)
Calcium: 9.4 mg/dL (ref 8.9–10.3)
Chloride: 103 mmol/L (ref 98–111)
Creatinine, Ser: 1.76 mg/dL — ABNORMAL HIGH (ref 0.44–1.00)
GFR, Estimated: 29 mL/min — ABNORMAL LOW (ref >60)
Glucose, Bld: 352 mg/dL — ABNORMAL HIGH (ref 70–99)
Potassium: 4.6 mmol/L (ref 3.5–5.1)
Sodium: 137 mmol/L (ref 135–145)
Total Bilirubin: 0.5 mg/dL (ref 0.3–1.2)
Total Protein: 7.9 g/dL (ref 6.5–8.1)

## 2020-08-27 MED ORDER — FLUTICASONE PROPIONATE 50 MCG/ACT NA SUSP: 1.0000 | Freq: Every day | NASAL | 2 refills | Status: DC

## 2020-08-27 MED ORDER — ACETAMINOPHEN 500 MG PO TABS
1000.0000 mg | ORAL_TABLET | Freq: Once | ORAL | Status: AC
  Administered 2020-08-27: 1000 mg via ORAL
  Filled 2020-08-27: qty 2

## 2020-08-27 MED ORDER — ONDANSETRON HCL 4 MG/2ML IJ SOLN
4.0000 mg | Freq: Once | INTRAMUSCULAR | Status: AC
  Administered 2020-08-27: 4 mg via INTRAVENOUS
  Filled 2020-08-27: qty 2

## 2020-08-27 MED ORDER — SODIUM CHLORIDE 0.9 % IV BOLUS
1000.0000 mL | Freq: Once | INTRAVENOUS | Status: AC
  Administered 2020-08-27: 1000 mL via INTRAVENOUS

## 2020-08-27 MED ORDER — ONDANSETRON 4 MG PO TBDP: 4.0000 mg | ORAL_TABLET | Freq: Three times a day (TID) | ORAL | 0 refills | Status: DC | PRN

## 2020-08-27 NOTE — ED Notes (Signed)
Pt ambulated around room and SpO2 stayed between 96-98% on RA. Pt denies SHOB when ambulating.

## 2020-08-27 NOTE — ED Provider Notes (Signed)
[facility] [place] DEPT Provider Note   CSN: [id number] Arrival date & time: 08/27/20  1247     History Chief Complaint  Patient presents with   Covid Positive    [name] is a 80 y.o. female with PMH/o ACS, AI, DM, HTN who presents for evaluation of cough, fatigue, SOB, nausea, headache, body aches that has been ongoing for the last week. She has been feeling back for approximately 1 week. She went to her doctors office on 08/19/20 and was tested positive for COVID. She has been vaccinated and had a booster as well. She reports her doctor prescribed her a Z pack which she did not finish as well as an antiviral, which she has finished. Despite that she has continued to have cough, SOB. She has also felt nausea and fatigued. She feels like her body doesn't have any energy. She has been nauseous but no vomiting. She has been forcing herself to eat. She does report some chest soreness from coughing. No abdominal pain. No diarrhea. No urinary complaints. She has a history of DM and states she has been taking her medications.   The history is provided by the patient.      Past Medical History:  Diagnosis Date   ACS (acute coronary syndrome) ([place]) 12/27/2010   AKI (acute kidney injury) ([place]) 03/25/2018   Anginal pain ([place])    Arthritis    Asthma    Diabetes mellitus    Type 1   Dysrhythmia    "irregular heart beat"   GERD (gastroesophageal reflux disease)    History of urinary tract infection    Hypercholesteremia    Hypertension    left renal ca dx'd 2012 (?)   surg only, left kidney   Nocturia    Reflux    Renal failure (ARF), acute on chronic (HCC)    Shortness of breath dyspnea    pt denies; states can climb stairs w/o difficulty    Sleep apnea    does not use c-pap machine   Tingling in extremities    legs bilat   Tinnitus    Urinary frequency    Urinary incontinence    Vertigo    occurs when lying flat     Patient Active Problem List    Diagnosis Date Noted   Pain due to onychomycosis of toenails of both feet 09/01/2018   Left shoulder pain    Limited joint range of motion (ROM)    Chest pain 10/23/2015   Diabetes mellitus type 2 in obese ([place]) 10/23/2015   Chest pain with high risk for cardiac etiology 07/28/2014   Diabetes mellitus 12/27/2010   Hypertension 12/27/2010   Hyperlipidemia 12/27/2010   GERD (gastroesophageal reflux disease) 12/27/2010   Renal cell adenocarcinoma ([place]) 12/27/2010   Arthritis 12/27/2010   Cervical stenosis of spine 12/27/2010   Carpal tunnel syndrome 12/27/2010   Asthma 12/27/2010    Past Surgical History:  Procedure Laterality Date   ABDOMINAL HYSTERECTOMY     APPENDECTOMY     CARDIAC CATHETERIZATION N/A 07/28/2014   Procedure: Left Heart Cath and Coronary Angiography;  Surgeon: [name], MD;  Location: [facility];  Service: Cardiovascular;  Laterality: N/A;   CYSTOSCOPY WITH RETROGRADE PYELOGRAM, URETEROSCOPY AND STENT PLACEMENT Right 02/09/2015   Procedure: CYSTOSCOPY WITH RETROGRADE PYELOGRAM AND URETEROSCOPY ;  Surgeon: Raynelle Bring, MD;  Location: [facility];  Service: Urology;  Laterality: Right;   LAPAROSCOPIC NEPHRECTOMY Right 03/20/2015   Procedure: LAPAROSCOPIC NEPHRECTOMY;  Surgeon:  Raynelle Bring, MD;  Location: [facility];  Service: Urology;  Laterality: Right;   PERIPHERAL VASCULAR CATHETERIZATION N/A 07/28/2014   Procedure: Aortic Arch Angiography;  Surgeon: [name], MD;  Location: [facility];  Service: Cardiovascular;  Laterality: N/A;   RENAL MASS EXCISION     SPINE SURGERY       OB History   No obstetric history on file.     Family History  Problem Relation Age of Onset   Hypertension Mother    Coronary artery disease Other    Breast cancer Cousin     Social History   Tobacco Use   Smoking status: Never   Smokeless tobacco: Never  Vaping Use   Vaping Use: Never used  Substance Use Topics   Alcohol use: No   Drug use: No    Home  Medications Prior to Admission medications   Medication Sig Start Date End Date Taking? Authorizing Provider  ondansetron (ZOFRAN ODT) 4 MG disintegrating tablet Take 1 tablet (4 mg total) by mouth every 8 (eight) hours as needed for nausea or vomiting. 08/27/20  Yes Volanda Napoleon, PA-C  acetaminophen (TYLENOL 8 HOUR) 650 MG CR tablet Take 1 tablet (650 mg total) by mouth every 8 (eight) hours as needed for pain. 02/18/20   Suzy Bouchard, PA-C  albuterol (PROVENTIL,VENTOLIN) 90 MCG/ACT inhaler Inhale 2 puffs into the lungs every 4 (four) hours as needed for wheezing or shortness of breath.     [provider]  amLODipine (NORVASC) 5 MG tablet TAKE 1 TABLET BY MOUTH EVERY DAY 06/28/19   Patwardhan, Manish J, MD  aspirin EC 81 MG tablet Take 81 mg by mouth at bedtime.    [provider]  atorvastatin (LIPITOR) 10 MG tablet Take 10 mg by mouth every morning.    [provider]  cholecalciferol (VITAMIN D) 1000 units tablet Take 1,000 Units by mouth daily.    [provider]  donepezil (ARICEPT) 10 MG tablet Take 1 tablet (10 mg total) by mouth at bedtime. 11/09/19   Star Age, MD  fluticasone (FLONASE) 50 MCG/ACT nasal spray Place 1 spray into both nostrils daily. 08/27/20   Volanda Napoleon, PA-C  gabapentin (NEURONTIN) 300 MG capsule Take 1 capsule (300 mg total) by mouth at bedtime. 07/16/16   Wallene Huh, DPM  HYDROcodone-acetaminophen (NORCO) 10-325 MG tablet Take 1 tablet by mouth daily as needed for pain. 06/01/20   [provider]  insulin degludec (TRESIBA) 200 UNIT/ML FlexTouch Pen Inject 78 Units into the skin daily before breakfast.     [provider]  lidocaine (LIDODERM) 5 % Place 1 patch onto the skin daily. Remove & Discard patch within 12 hours or as directed by MD Patient not taking: Reported on 06/01/2020 03/27/18   Reyne Dumas, MD  methocarbamol (ROBAXIN) 500 MG tablet Take 1 tablet (500 mg total) by mouth 2 (two)  times daily. Patient not taking: No sig reported 02/18/20   Suzy Bouchard, PA-C  methylPREDNISolone (MEDROL DOSEPAK) 4 MG TBPK tablet Take as instructed Patient not taking: No sig reported 05/04/20   Valarie Merino, MD  metoprolol succinate (TOPROL-XL) 100 MG 24 hr tablet TAKE 1 TABLET (100 MG TOTAL) BY MOUTH DAILY. PT NEEDS TO SCHEDULE F/U APPT FOR FURTHER REFILLS. 07/03/20   [name], MD  mirabegron ER (MYRBETRIQ) 50 MG TB24 tablet Take 50 mg by mouth daily.    [provider]  Multiple Vitamins-Minerals (CENTRUM SILVER ADULT 50+ PO)  Take 1 tablet by mouth daily.    [provider]  nitroGLYCERIN (NITROSTAT) 0.4 MG SL tablet Place 0.4 mg under the tongue every 5 (five) minutes as needed for chest pain.    [provider]  oxyCODONE (ROXICODONE) 5 MG immediate release tablet Take 1 tablet (5 mg total) by mouth every 6 (six) hours as needed for severe pain. Patient not taking: No sig reported 05/04/20   Valarie Merino, MD  oxyCODONE-acetaminophen (PERCOCET) 5-325 MG tablet Take 1 tablet by mouth every 4 (four) hours as needed for moderate pain or severe pain. 06/01/20   Daleen Bo, MD  pantoprazole (PROTONIX) 40 MG tablet Take 40 mg by mouth daily as needed (heartburn).  03/25/19   [provider]  TRADJENTA 5 MG TABS tablet Take 5 mg by mouth daily. 06/24/19   [provider]    Allergies    Sulfa antibiotics  Review of Systems   Review of Systems  Constitutional:  Positive for activity change, appetite change, chills and fatigue. Negative for fever.  Respiratory:  Positive for cough and shortness of breath.   Cardiovascular:  Negative for chest pain.  Gastrointestinal:  Positive for nausea. Negative for abdominal pain and vomiting.  Genitourinary:  Negative for dysuria and hematuria.  Musculoskeletal:  Positive for myalgias.  Neurological:  Positive for light-headedness. Negative for headaches.  All other systems reviewed and are  negative.  Physical Exam Updated Vital Signs BP (!) 158/87   Pulse 70   Temp 98.4 F (36.9 C) (Oral)   Resp 18   Ht 4\' 11"  (1.499 m)   Wt 68 kg   SpO2 98%   BMI 30.30 kg/m   Physical Exam Vitals and nursing note reviewed.  Constitutional:      Appearance: Normal appearance. She is well-developed.  HENT:     Head: Normocephalic and atraumatic.  Eyes:     General: Lids are normal.     Conjunctiva/sclera: Conjunctivae normal.     Pupils: Pupils are equal, round, and reactive to light.     Comments: PERRL. EOMs intact. No nystagmus. No neglect.   Cardiovascular:     Rate and Rhythm: Normal rate and regular rhythm.     Pulses: Normal pulses.     Heart sounds: Normal heart sounds. No murmur heard.   No friction rub. No gallop.  Pulmonary:     Effort: Pulmonary effort is normal.     Breath sounds: Normal breath sounds.     Comments: Lungs clear to auscultation bilaterally.  Symmetric chest rise.  No wheezing, rales, rhonchi. Able to speak in full sentences without any difficulty. No evidences of respiratory distress. No wheezing.  Abdominal:     Palpations: Abdomen is soft. Abdomen is not rigid.     Tenderness: There is no abdominal tenderness. There is no guarding.     Comments: Abdomen is soft, non-distended, non-tender. No rigidity, No guarding. No peritoneal signs.  Musculoskeletal:        General: Normal range of motion.     Cervical back: Full passive range of motion without pain.  Skin:    General: Skin is warm and dry.     Capillary Refill: Capillary refill takes less than 2 seconds.  Neurological:     Mental Status: She is alert and oriented to person, place, and time.     Comments: Cranial nerves III-XII intact Follows commands, Moves all extremities  5/5 strength to BUE and BLE  Sensation intact throughout all major nerve  distributions No slurred speech. No facial droop.   Psychiatric:        Speech: Speech normal.    ED Results / Procedures / Treatments    Labs (all labs ordered are listed, but only abnormal results are displayed) Labs Reviewed  COMPREHENSIVE METABOLIC PANEL - Abnormal; Notable for the following components:      Result Value   Glucose, Bld 352 (*)    BUN 27 (*)    Creatinine, Ser 1.76 (*)    AST 14 (*)    GFR, Estimated 29 (*)    All other components within normal limits  URINALYSIS, ROUTINE W REFLEX MICROSCOPIC - Abnormal; Notable for the following components:   Color, Urine STRAW (*)    Glucose, UA >=500 (*)    Protein, ur 30 (*)    All other components within normal limits  CBC WITH DIFFERENTIAL/PLATELET    EKG EKG Interpretation  Date/Time:  Sunday August 27 2020 16:37:43 EDT Ventricular Rate:  69 PR Interval:  172 QRS Duration: 82 QT Interval:  402 QTC Calculation: 430 R Axis:   52 Text Interpretation: Normal sinus rhythm Normal ECG When compared to prior, similar appearance. No STEMI Confirmed by Tegeler, Chris ([id number]) on 08/27/2020 5:06:35 PM  Radiology DG Chest 2 View  Result Date: 08/27/2020 CLINICAL DATA:  Cough, COVID 10 days ago EXAM: CHEST - 2 VIEW COMPARISON:  03/01/2020 FINDINGS: The heart size and mediastinal contours are within normal limits. Both lungs are clear. The visualized skeletal structures are unremarkable. IMPRESSION: No acute abnormality of the lungs. Electronically Signed   By: Alex  Bibbey M.D.   On: 08/27/2020 14:09    Procedures Procedures   Medications Ordered in ED Medications  sodium chloride 0.9 % bolus 1,000 mL (0 mLs Intravenous Stopped 08/27/20 2032)  ondansetron (ZOFRAN) injection 4 mg (4 mg Intravenous Given 08/27/20 1658)  acetaminophen (TYLENOL) tablet 1,000 mg (1,000 mg Oral Given 08/27/20 1839)    ED Course  I have reviewed the triage vital signs and the nursing notes.  Pertinent labs & imaging results that were available during my care of the patient were reviewed by me and considered in my medical decision making (see chart for details).    MDM  Rules/Calculators/A&P                          79  year old female past ministry of diabetes, hypertension, CAD presents for evaluation of generalized body aches, fatigue, headache, nausea, chills.  Was diagnosed with COVID on 08/19/2020.  States she is continue to have cough, shortness of breath.  She was prescribed a Z-Pak which she did not finish as well as antiviral medications which she completed.  She has had some chills.  She has had some nausea but no vomiting.  On initial arrival, she is afebrile, toxic appearing.  Vital signs are stable.  On exam, she has no abdominal tenderness.  No neurodeficits.  Lungs clear to auscultation with no evidence of respiratory distress.  I suspect with her consolation of symptoms, this is related to ongoing COVID-19.  We will plan to check labs, chest x-ray and reassess.  CBC shows no leukocytosis or anemia. CMP shows hyperglycemia. BUN of 27, Cr of 1.76. This is her baseline. Work up non consistent with DKA.  UA shows glucosuria.  No infectious etiology.  CXR shows no evidence of pneumonia.   Patient able to ambulate in the ED well maintaining O2 sats of 95-97% on room air.  At this time, patient is well-appearing.  Work-up is reassuring.  Patient is stable for discharge at this time.  I did discuss extensively with daughter.  She did inquire about Z-Pak, prednisone.  I discussed that again this is viral in nature and patient shows no signs of pneumonia, Z-Pak would not be warranted.  Additionally, given patient's history of diabetes, hyperglycemia would hold off any oral steroids.  We can do a nasal steroid to help with her congestion.  At this time, patient is hemodynamically stable and no signs of hypoxia.  Patient stable for discharge at this time. At this time, patient exhibits no emergent life-threatening condition that require further evaluation in ED. Discussed patient with Dr. Sherry Ruffing who is agreeable to plan.   Portions of this note were generated with  [employer]. Dictation errors may occur despite best attempts at proofreading.   Final Clinical Impression(s) / ED Diagnoses Final diagnoses:  Cough  Shortness of breath  Nausea    Rx / DC Orders ED Discharge Orders          Ordered    ondansetron (ZOFRAN ODT) 4 MG disintegrating tablet  Every 8 hours PRN        08/27/20 1945    fluticasone (FLONASE) 50 MCG/ACT nasal spray  Daily,   Status:  Discontinued        08/27/20 1945    fluticasone (FLONASE) 50 MCG/ACT nasal spray  Daily        08/27/20 2021             Volanda Napoleon, PA-C 08/27/20 2341    Tegeler, Gwenyth Allegra, MD 08/28/20 [phone]

## 2020-08-27 NOTE — Discharge Instructions (Addendum)
Use flonase as directed for nasal congestion.   Take zofran for nausea.   Follow up with your primary care doctor or the [facility].   Return to the Emergency Dept for any worsening cough, shortness of breath, chest pain, vomiting or any other worsening or concerning symptoms.

## 2020-08-27 NOTE — ED Provider Notes (Signed)
Emergency Medicine Provider Triage Evaluation Note  CAMRIN LAPRE , a 80 y.o. female  was evaluated in triage.  Pt complains of 8 days of cough, congestion, nausea. No vomiting. Uncertain of fevers.  Review of Systems  Positive: cough Negative: Vomiting   Physical Exam  BP (!) 167/93 (BP Location: Left Arm)   Pulse 66   Temp 98.4 F (36.9 C) (Oral)   Resp 19   Ht 4\' 11"  (1.499 m)   Wt 68 kg   SpO2 95%   BMI 30.30 kg/m  Gen:   Awake, no distress   Resp:  Normal effort  MSK:   Moves extremities without difficulty  Other:  Lungs clear  Medical Decision Making  Medically screening exam initiated at 1:42 PM.  Appropriate orders placed.  TORII ROYSE was informed that the remainder of the evaluation will be completed by another provider, this initial triage assessment does not replace that evaluation, and the importance of remaining in the ED until their evaluation is complete.  COVID + 7/2 has had persistent symptoms concerned about her cough. CXR to eval for PNA   Tedd Sias, [place] 08/27/20 1347    Milton Ferguson, MD 08/27/20 1701

## 2020-08-27 NOTE — ED Triage Notes (Signed)
Patient states she is she because she has Covid, complains of headache, body aches, fatigue. States she is still having hot and cold chills, has not taken her temperature. Has been taking Tylenol as needed.

## 2020-08-27 NOTE — ED Notes (Signed)
Daughter Sharyn Lull would like to speak with nurse or PA on the plan of action for mother. Her number is in epic.

## 2020-08-31 DIAGNOSIS — Z20828 Contact with and (suspected) exposure to other viral communicable diseases: Secondary | ICD-10-CM | POA: Diagnosis not present

## 2020-08-31 DIAGNOSIS — Z20822 Contact with and (suspected) exposure to covid-19: Secondary | ICD-10-CM | POA: Diagnosis not present

## 2020-09-17 DIAGNOSIS — Z794 Long term (current) use of insulin: Secondary | ICD-10-CM | POA: Diagnosis not present

## 2020-09-17 DIAGNOSIS — E1122 Type 2 diabetes mellitus with diabetic chronic kidney disease: Secondary | ICD-10-CM | POA: Diagnosis not present

## 2020-09-17 DIAGNOSIS — N184 Chronic kidney disease, stage 4 (severe): Secondary | ICD-10-CM | POA: Diagnosis not present

## 2020-09-17 DIAGNOSIS — I129 Hypertensive chronic kidney disease with stage 1 through stage 4 chronic kidney disease, or unspecified chronic kidney disease: Secondary | ICD-10-CM | POA: Diagnosis not present

## 2020-10-18 DIAGNOSIS — I129 Hypertensive chronic kidney disease with stage 1 through stage 4 chronic kidney disease, or unspecified chronic kidney disease: Secondary | ICD-10-CM | POA: Diagnosis not present

## 2020-10-18 DIAGNOSIS — E1122 Type 2 diabetes mellitus with diabetic chronic kidney disease: Secondary | ICD-10-CM | POA: Diagnosis not present

## 2020-10-18 DIAGNOSIS — N184 Chronic kidney disease, stage 4 (severe): Secondary | ICD-10-CM | POA: Diagnosis not present

## 2020-10-18 DIAGNOSIS — Z794 Long term (current) use of insulin: Secondary | ICD-10-CM | POA: Diagnosis not present

## 2020-10-24 ENCOUNTER — Other Ambulatory Visit: Payer: Self-pay | Admitting: Cardiology

## 2020-10-24 DIAGNOSIS — I10 Essential (primary) hypertension: Secondary | ICD-10-CM

## 2020-10-30 DIAGNOSIS — M25562 Pain in left knee: Secondary | ICD-10-CM | POA: Diagnosis not present

## 2020-10-30 DIAGNOSIS — M25512 Pain in left shoulder: Secondary | ICD-10-CM | POA: Diagnosis not present

## 2020-10-30 DIAGNOSIS — M549 Dorsalgia, unspecified: Secondary | ICD-10-CM | POA: Diagnosis not present

## 2020-10-30 DIAGNOSIS — E1122 Type 2 diabetes mellitus with diabetic chronic kidney disease: Secondary | ICD-10-CM | POA: Diagnosis not present

## 2020-11-16 DIAGNOSIS — N189 Chronic kidney disease, unspecified: Secondary | ICD-10-CM | POA: Diagnosis not present

## 2020-11-16 DIAGNOSIS — N184 Chronic kidney disease, stage 4 (severe): Secondary | ICD-10-CM | POA: Diagnosis not present

## 2020-11-16 DIAGNOSIS — I129 Hypertensive chronic kidney disease with stage 1 through stage 4 chronic kidney disease, or unspecified chronic kidney disease: Secondary | ICD-10-CM | POA: Diagnosis not present

## 2020-11-16 DIAGNOSIS — E119 Type 2 diabetes mellitus without complications: Secondary | ICD-10-CM | POA: Diagnosis not present

## 2020-11-16 DIAGNOSIS — D631 Anemia in chronic kidney disease: Secondary | ICD-10-CM | POA: Diagnosis not present

## 2020-11-16 DIAGNOSIS — Z23 Encounter for immunization: Secondary | ICD-10-CM | POA: Diagnosis not present

## 2020-11-16 DIAGNOSIS — E1122 Type 2 diabetes mellitus with diabetic chronic kidney disease: Secondary | ICD-10-CM | POA: Diagnosis not present

## 2020-11-16 DIAGNOSIS — N2581 Secondary hyperparathyroidism of renal origin: Secondary | ICD-10-CM | POA: Diagnosis not present

## 2020-11-17 DIAGNOSIS — I129 Hypertensive chronic kidney disease with stage 1 through stage 4 chronic kidney disease, or unspecified chronic kidney disease: Secondary | ICD-10-CM | POA: Diagnosis not present

## 2020-11-17 DIAGNOSIS — Z794 Long term (current) use of insulin: Secondary | ICD-10-CM | POA: Diagnosis not present

## 2020-11-17 DIAGNOSIS — N184 Chronic kidney disease, stage 4 (severe): Secondary | ICD-10-CM | POA: Diagnosis not present

## 2020-11-17 DIAGNOSIS — E1122 Type 2 diabetes mellitus with diabetic chronic kidney disease: Secondary | ICD-10-CM | POA: Diagnosis not present

## 2020-11-19 IMAGING — MR MR ABDOMEN W/O CM
5 of 8 series · 23 of 48 positions shown · non-contrast
Comparison: 06/01/2017

CLINICAL DATA: Renal cell carcinoma.

EXAM:
MRI ABDOMEN WITHOUT CONTRAST
TECHNIQUE: Multiplanar multisequence MR imaging was performed without the
administration of intravenous contrast.

[Series 3: DWI b500 · axial · 6.0mm · 1.48mm/px · z∈[-111,+146]mm · 6 of 68 slices shown]
[im 1/68]
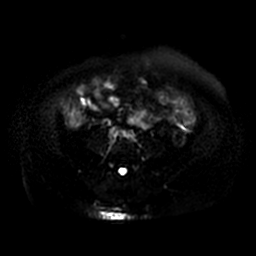
[im 14/68]
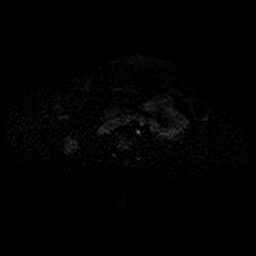
[im 27/68]
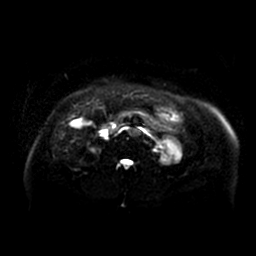
[im 41/68]
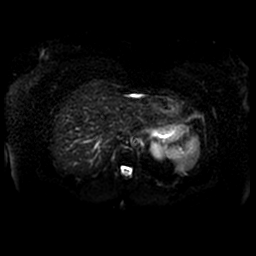
[im 54/68]
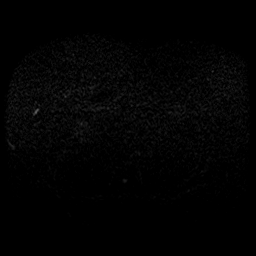
[im 68/68]
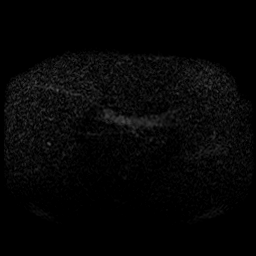

[Series 4: T2 fat-sat · axial · 5.0mm · 0.78mm/px · z∈[-108,+122]mm · 5 of 47 slices shown]
[im 1/47]
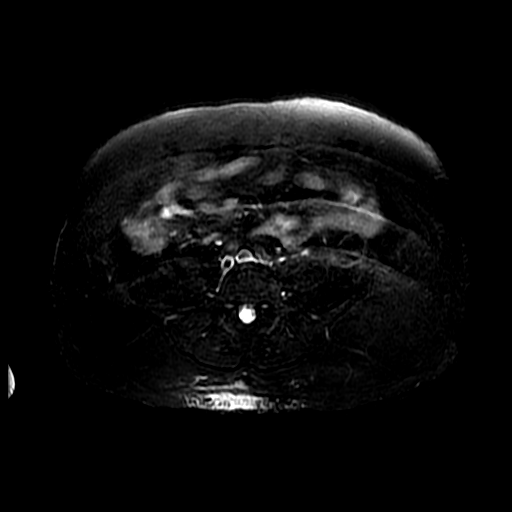
[im 12/47]
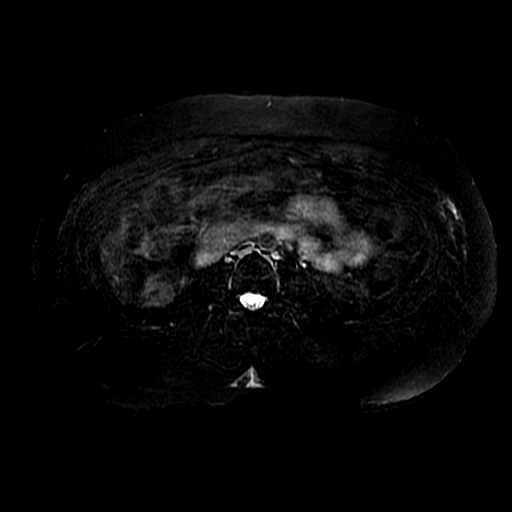
[im 24/47]
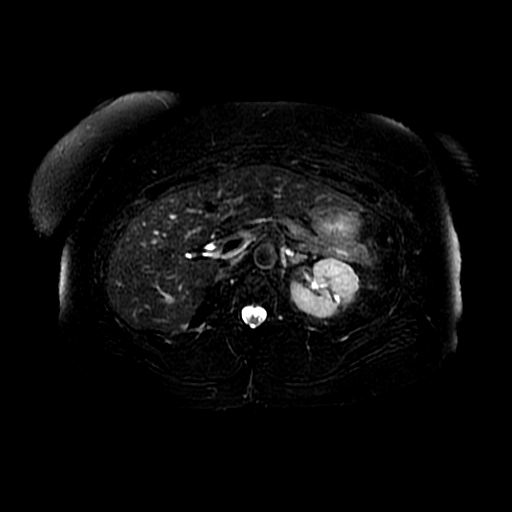
[im 35/47]
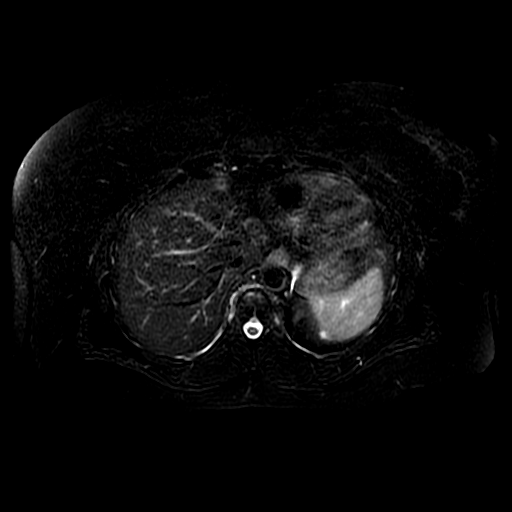
[im 47/47]
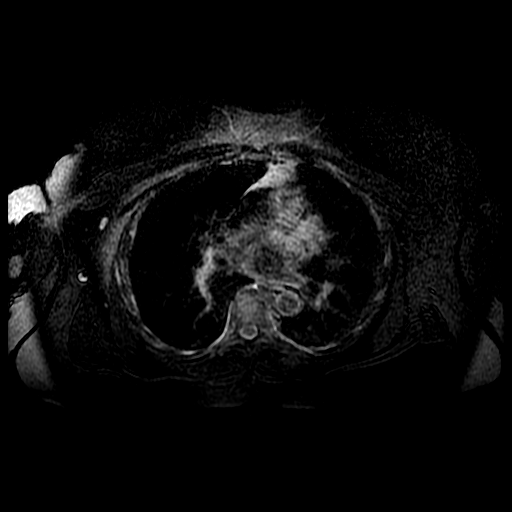

[Series 5: T2 · axial · 5.0mm · 0.78mm/px · z∈[-108,+122]mm · 5 of 47 slices shown (1 of 2)]
[im 1/47]
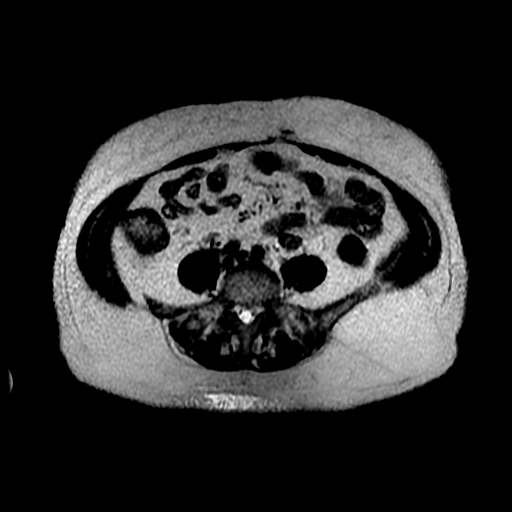
[im 12/47]
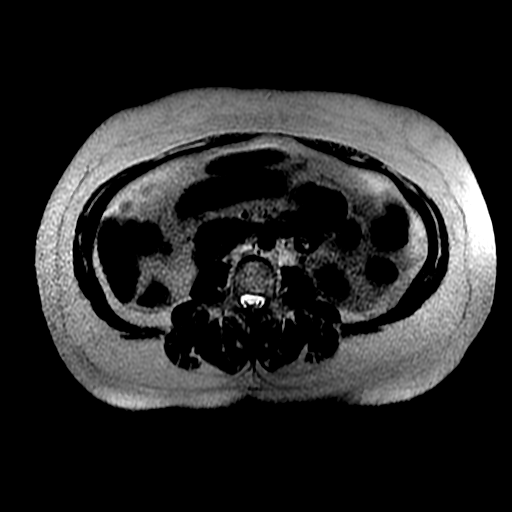
[im 24/47]
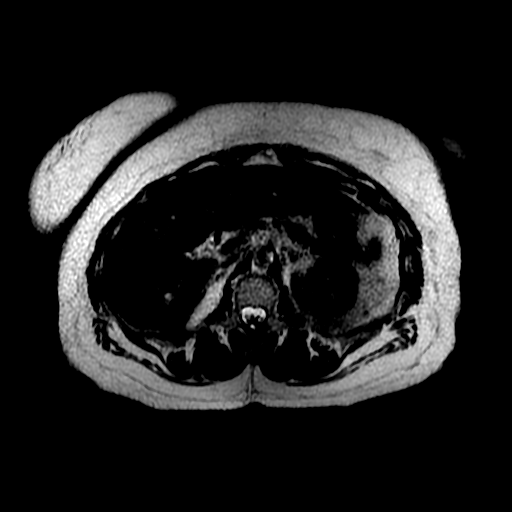
[im 35/47]
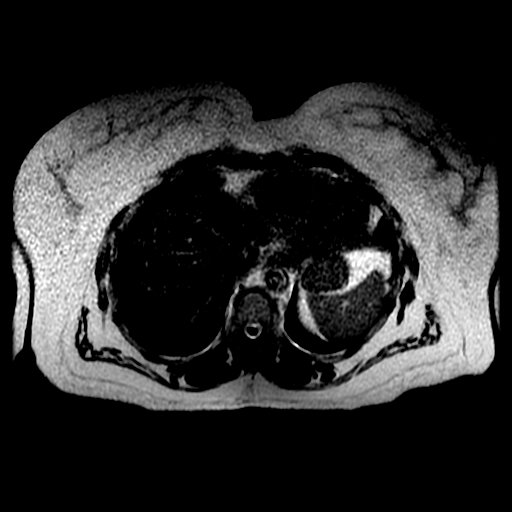
[im 47/47]
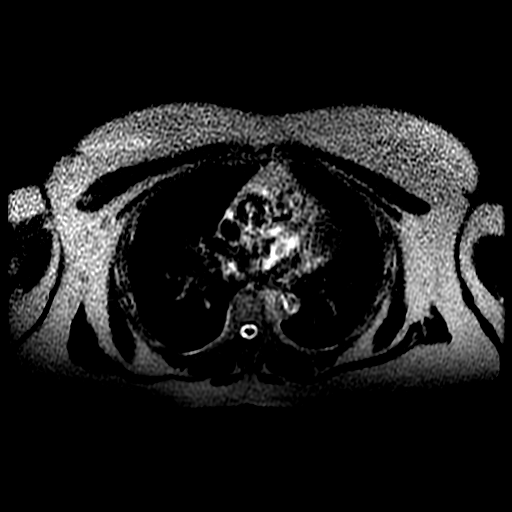

[Series 6: T2 · coronal · 5.0mm · 0.78mm/px · 5 of 45 slices shown (2 of 2)]
[im 1/45]
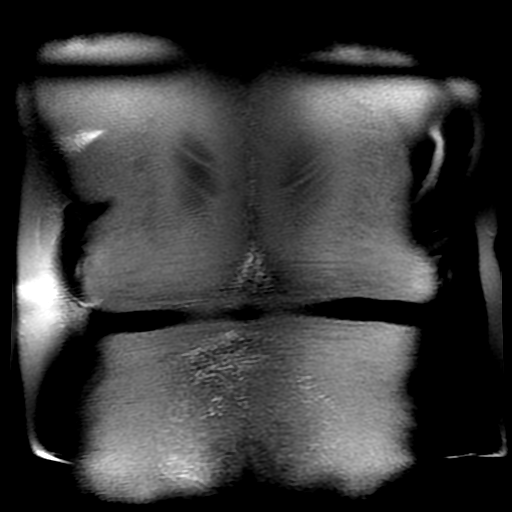
[im 12/45]
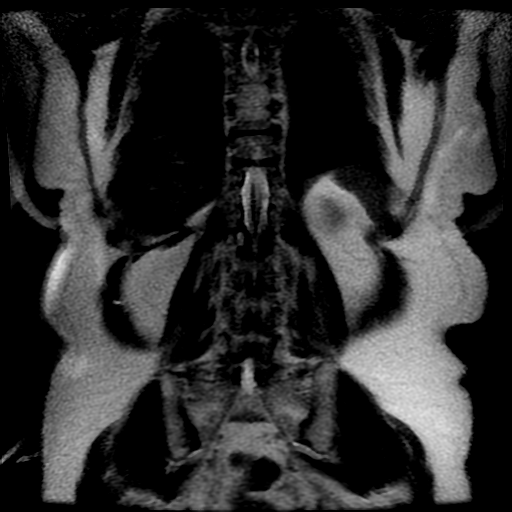
[im 23/45]
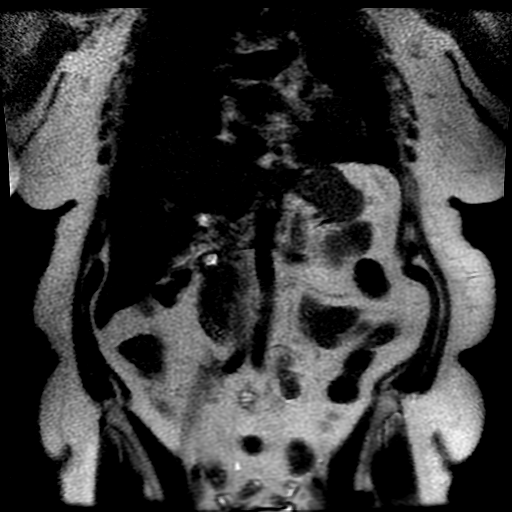
[im 34/45]
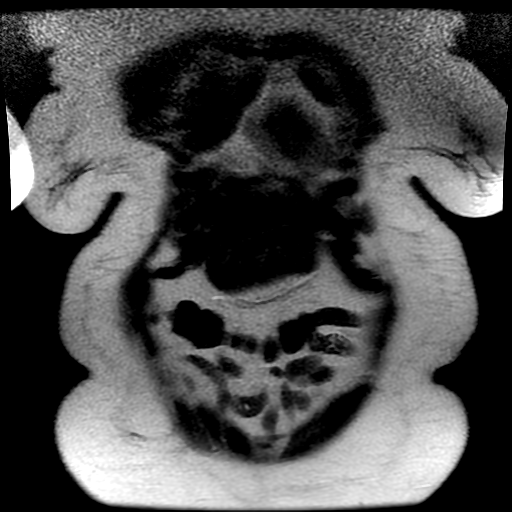
[im 45/45]
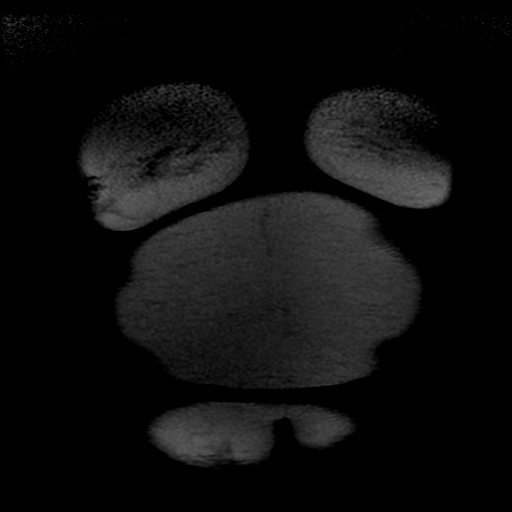

[Series 7: bSSFP · axial · 5.0mm · 0.78mm/px · z∈[-108,-53]mm · 2 of 47 slices shown]
[im 1/47]
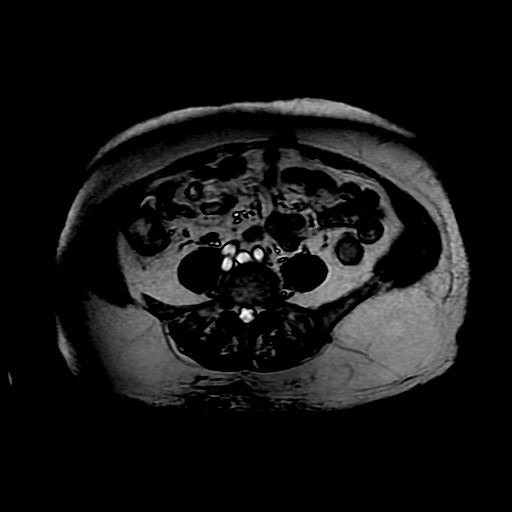
[im 12/47]
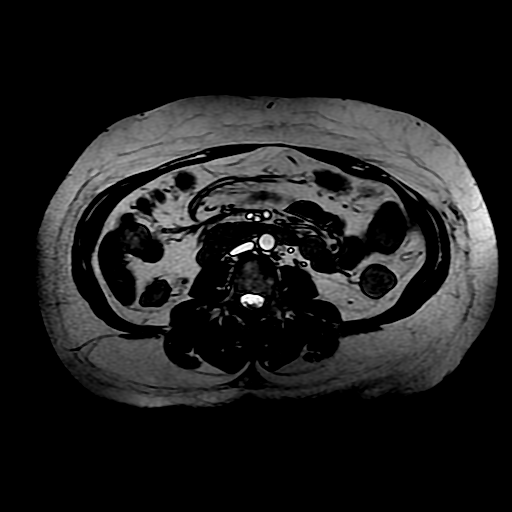

[23 of 48 positions shown; findings below may reference images not displayed]

FINDINGS: Lower chest: No acute findings.

Hepatobiliary: No focal liver abnormality. The gallbladder appears
normal. No biliary dilatation.

Pancreas: No mass, inflammatory changes, or other parenchymal
abnormality identified.

Spleen:  Within normal limits in size and appearance.

Adrenals/Urinary Tract:  Normal appearance of the adrenal glands.

Status post right nephrectomy. Partial nephrectomy from the
posterior cortex of the left mid kidney noted. No suspicious nodule
or mass identified to suggest residual or recurrence of tumor. No
hydronephrosis identified. No left-sided perinephric fluid
collections.

Stomach/Bowel: Visualized portions within the abdomen are
unremarkable.

Vascular/Lymphatic: No pathologically enlarged lymph nodes
identified. No abdominal aortic aneurysm demonstrated.

Other:  None.

Musculoskeletal: No suspicious bone lesions identified.
IMPRESSION: 1. No acute findings within the abdomen.
2. No findings to suggest residual or recurrence of tumor status
post right nephrectomy and left partial nephrectomy.

## 2020-11-19 IMAGING — DX DG CHEST 2V
2 series · 2 of 2 positions shown · non-contrast
Comparison: Radiographs June 01, 2017.

CLINICAL DATA: Personal history of kidney cancer.

EXAM:
CHEST - 2 VIEW

[chest pa]
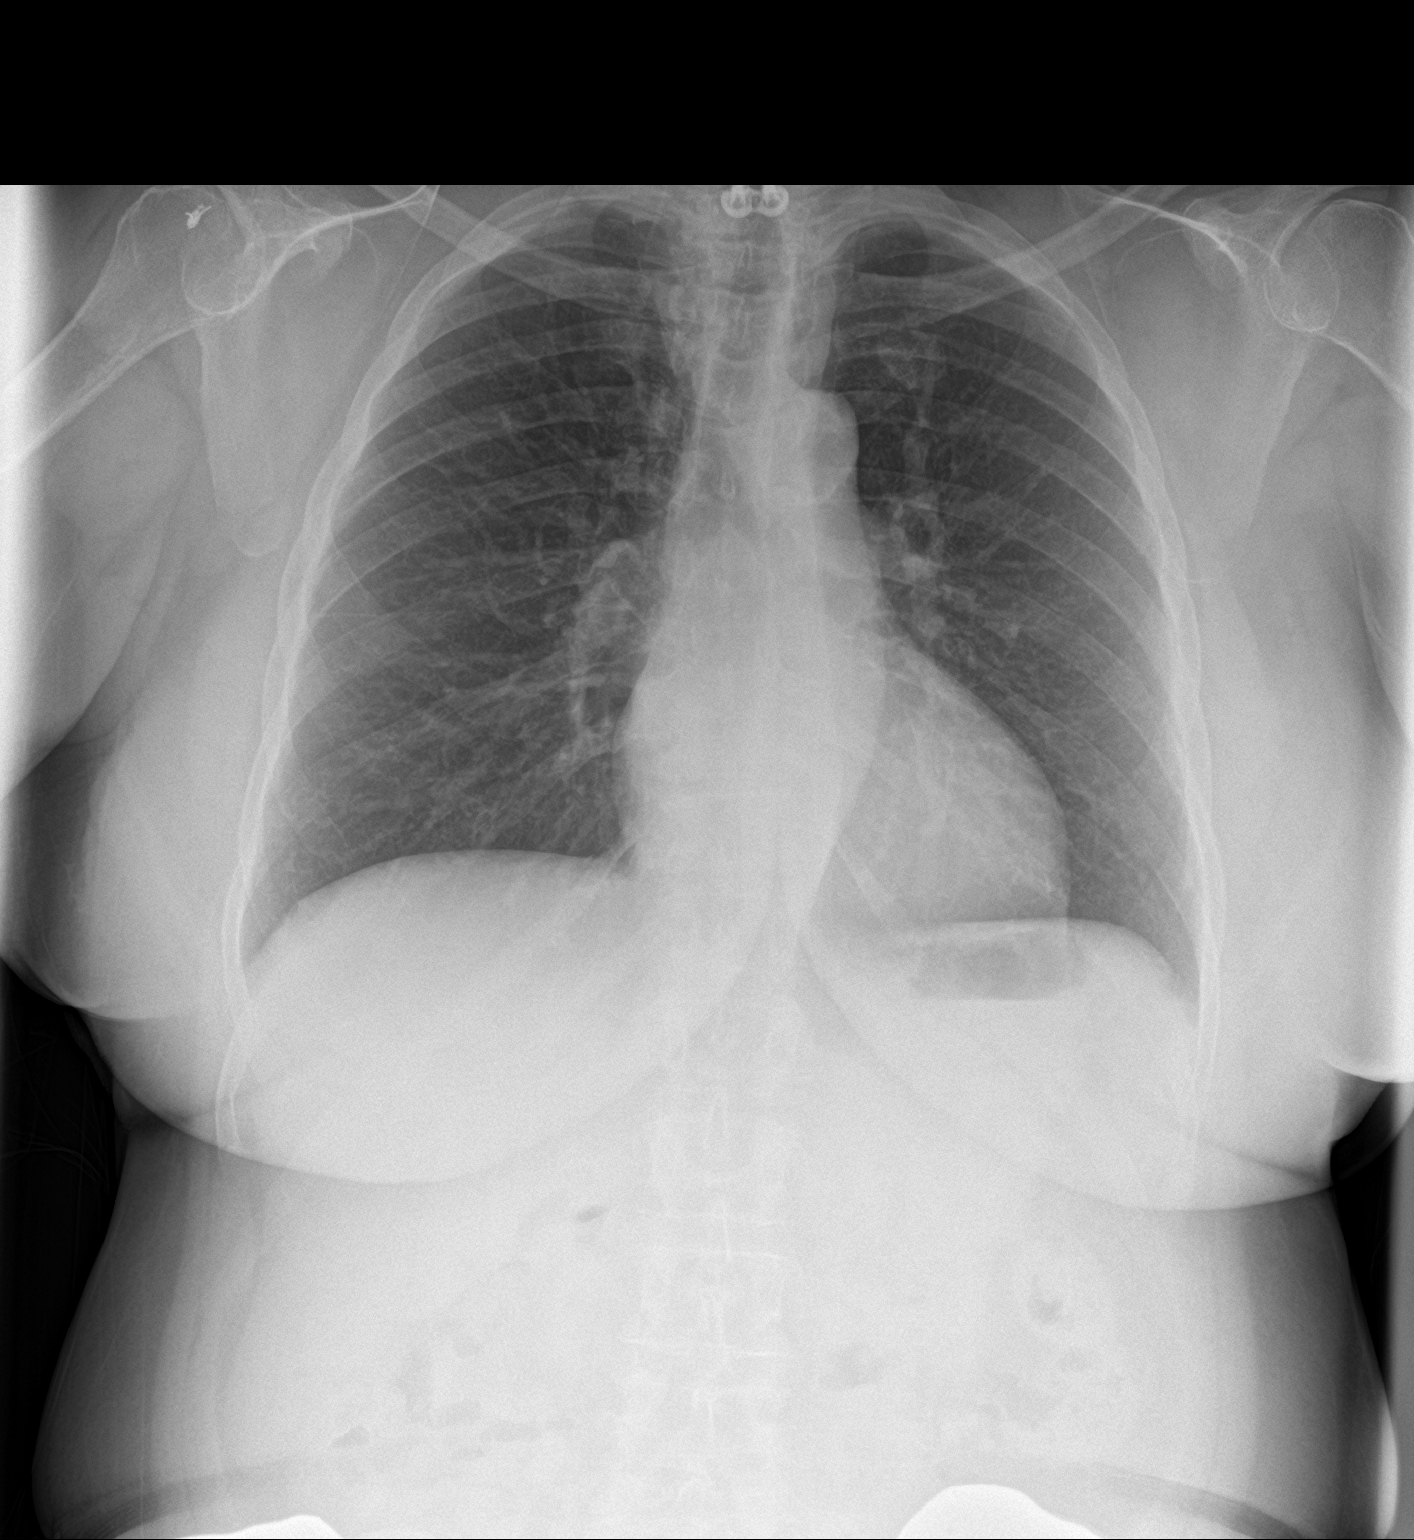

[chest lat]
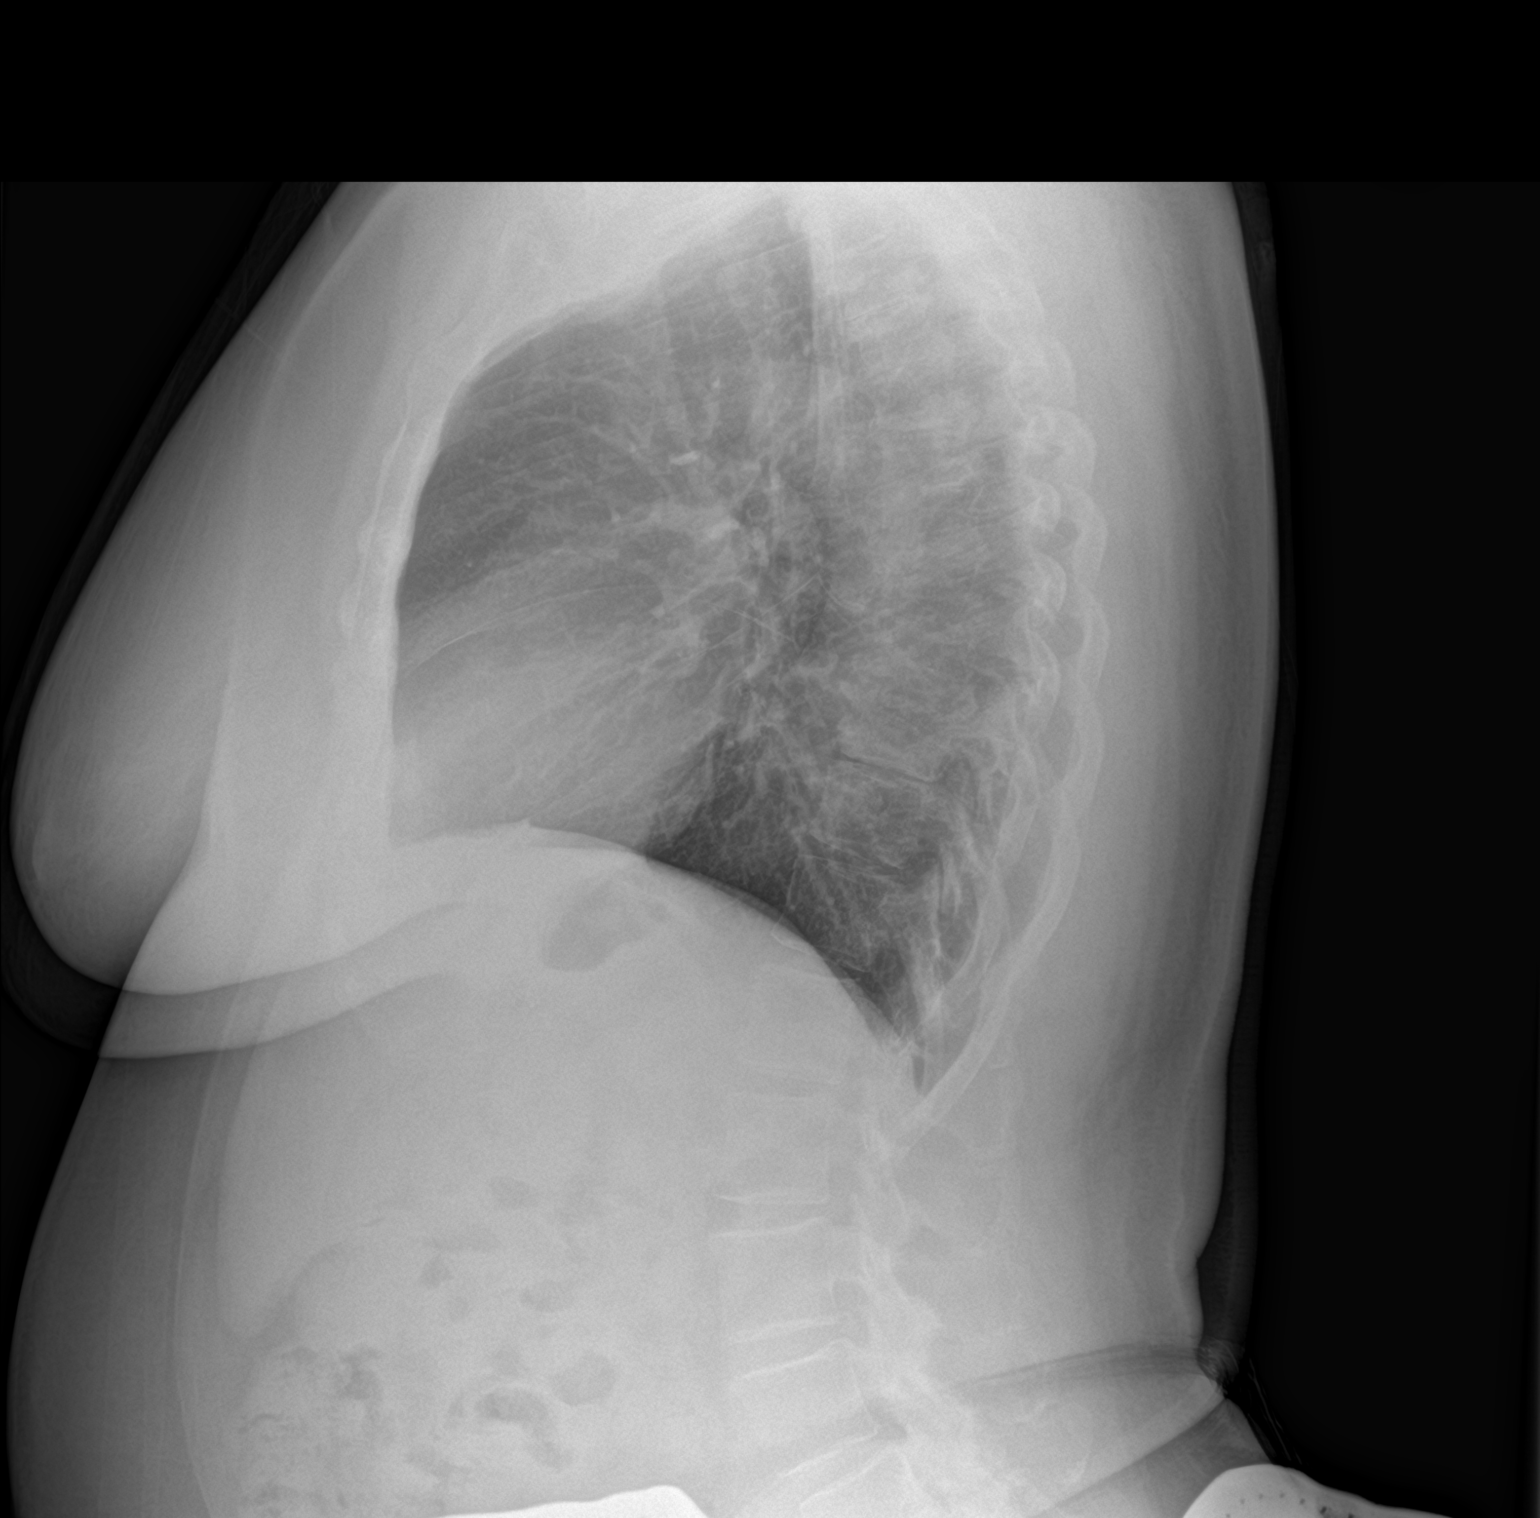

[2 of 2 positions shown; findings below may reference images not displayed]

FINDINGS: The heart size and mediastinal contours are within normal limits.
Both lungs are clear. The visualized skeletal structures are
unremarkable.
IMPRESSION: No active cardiopulmonary disease.

## 2020-11-20 ENCOUNTER — Other Ambulatory Visit: Payer: Self-pay

## 2020-11-20 ENCOUNTER — Encounter (INDEPENDENT_AMBULATORY_CARE_PROVIDER_SITE_OTHER): Payer: Medicare Other | Admitting: Ophthalmology

## 2020-11-20 DIAGNOSIS — H43813 Vitreous degeneration, bilateral: Secondary | ICD-10-CM

## 2020-11-20 DIAGNOSIS — H35033 Hypertensive retinopathy, bilateral: Secondary | ICD-10-CM | POA: Diagnosis not present

## 2020-11-20 DIAGNOSIS — I10 Essential (primary) hypertension: Secondary | ICD-10-CM | POA: Diagnosis not present

## 2020-11-20 DIAGNOSIS — E113393 Type 2 diabetes mellitus with moderate nonproliferative diabetic retinopathy without macular edema, bilateral: Secondary | ICD-10-CM | POA: Diagnosis not present

## 2020-12-18 DIAGNOSIS — E1122 Type 2 diabetes mellitus with diabetic chronic kidney disease: Secondary | ICD-10-CM | POA: Diagnosis not present

## 2020-12-18 DIAGNOSIS — I129 Hypertensive chronic kidney disease with stage 1 through stage 4 chronic kidney disease, or unspecified chronic kidney disease: Secondary | ICD-10-CM | POA: Diagnosis not present

## 2020-12-18 DIAGNOSIS — N184 Chronic kidney disease, stage 4 (severe): Secondary | ICD-10-CM | POA: Diagnosis not present

## 2020-12-26 ENCOUNTER — Other Ambulatory Visit: Payer: Self-pay | Admitting: Neurology

## 2021-01-17 DIAGNOSIS — Z794 Long term (current) use of insulin: Secondary | ICD-10-CM | POA: Diagnosis not present

## 2021-01-17 DIAGNOSIS — I129 Hypertensive chronic kidney disease with stage 1 through stage 4 chronic kidney disease, or unspecified chronic kidney disease: Secondary | ICD-10-CM | POA: Diagnosis not present

## 2021-01-17 DIAGNOSIS — E1122 Type 2 diabetes mellitus with diabetic chronic kidney disease: Secondary | ICD-10-CM | POA: Diagnosis not present

## 2021-01-17 DIAGNOSIS — N184 Chronic kidney disease, stage 4 (severe): Secondary | ICD-10-CM | POA: Diagnosis not present

## 2021-02-03 IMAGING — CT CT ABD-PELV W/O
2 of 4 series · 16 of 46 positions shown, 18 images · non-contrast
Comparison: Abdomen MR dated 01/08/2018 and 10/09/2017 and abdomen
and pelvis CT dated 06/01/2017.

CLINICAL DATA: Right flank pain the past month. Status post right
nephrectomy on 03/20/2015 for renal cell carcinoma and partial left
nephrectomy.

EXAM:
CT ABDOMEN AND PELVIS WITHOUT CONTRAST
TECHNIQUE: Multidetector CT imaging of the abdomen and pelvis was performed
following the standard protocol without IV contrast.

[Series 3: axial st · axial · 0.88mm/px · z∈[-450,-100]mm · 13 of 78 slices shown, 15 images]
[im 4/78  soft-tissue]
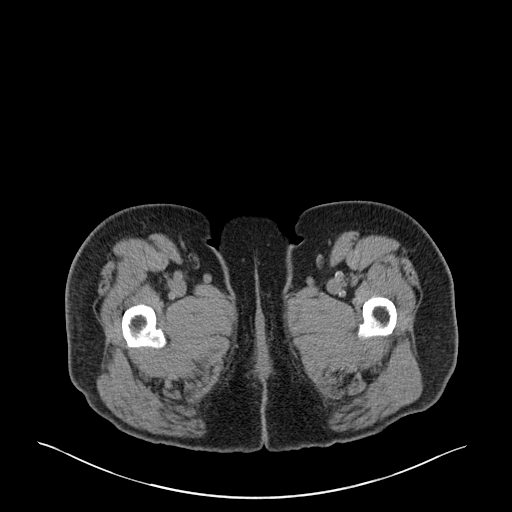
[im 4/78  bone]
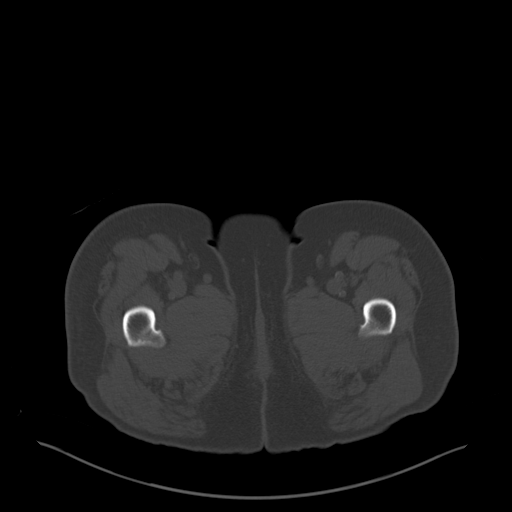
[im 12/78  soft-tissue]
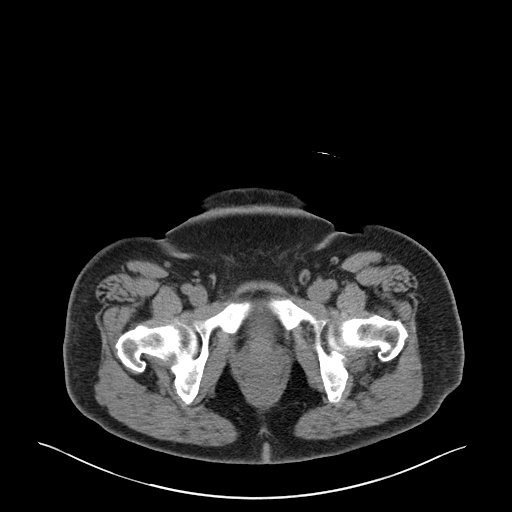
[im 16/78  soft-tissue]
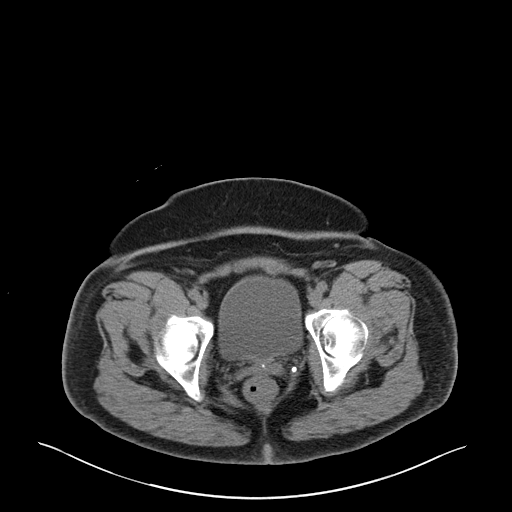
[im 24/78  soft-tissue]
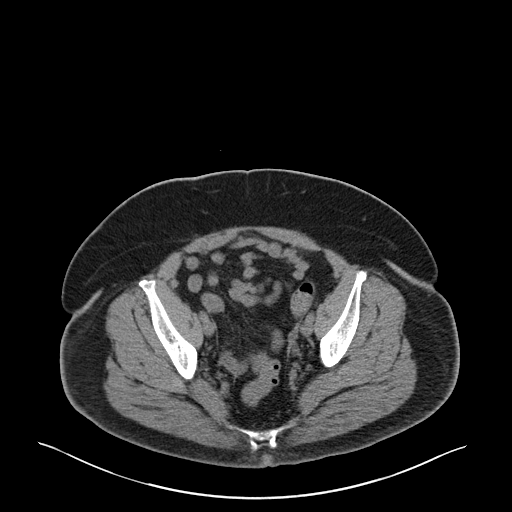
[im 27/78  soft-tissue]
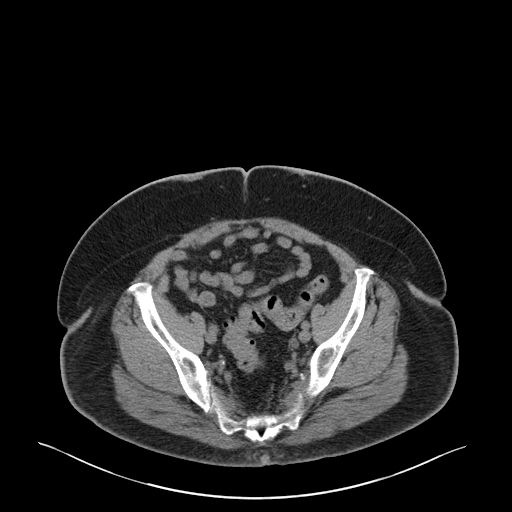
[im 35/78  soft-tissue]
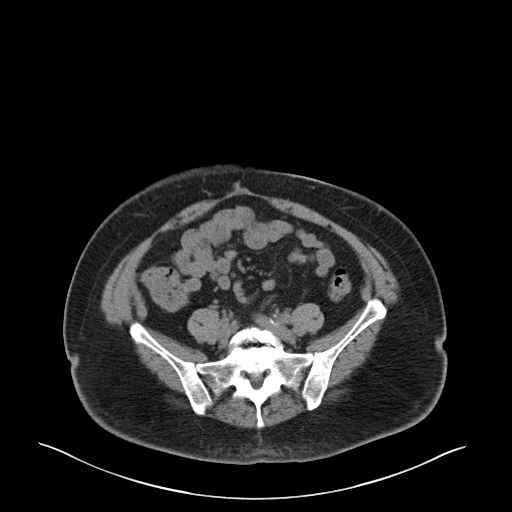
[im 39/78  soft-tissue]
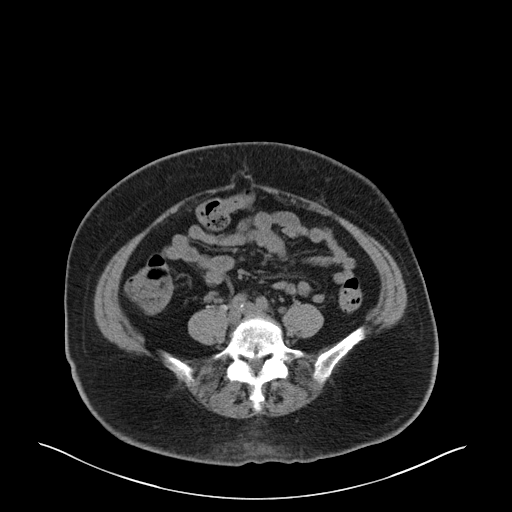
[im 43/78  soft-tissue]
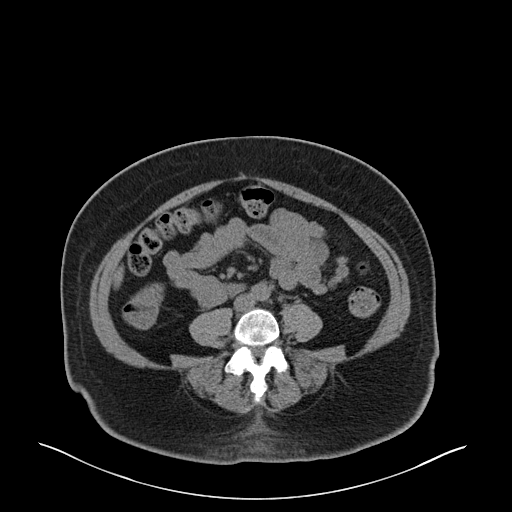
[im 51/78  soft-tissue]
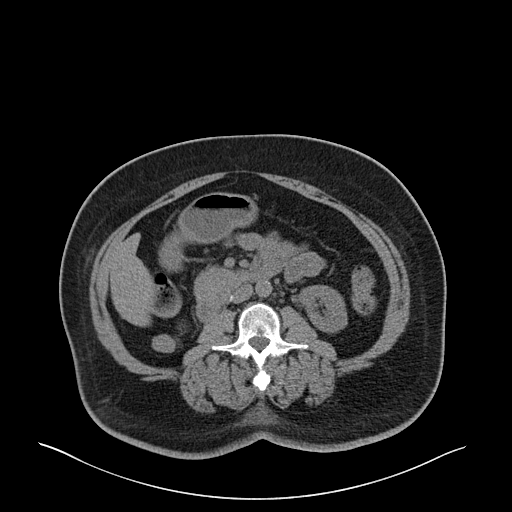
[im 51/78  bone]
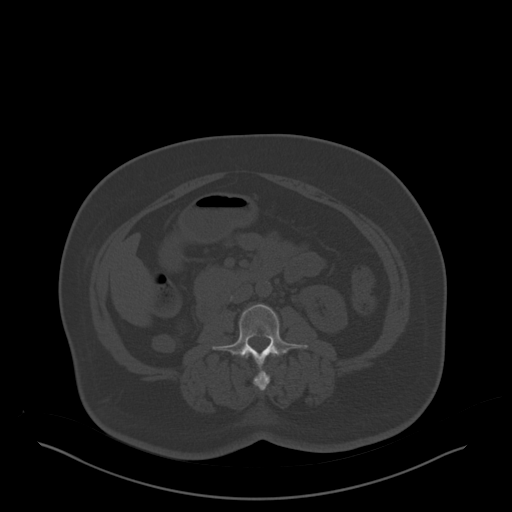
[im 54/78  soft-tissue]
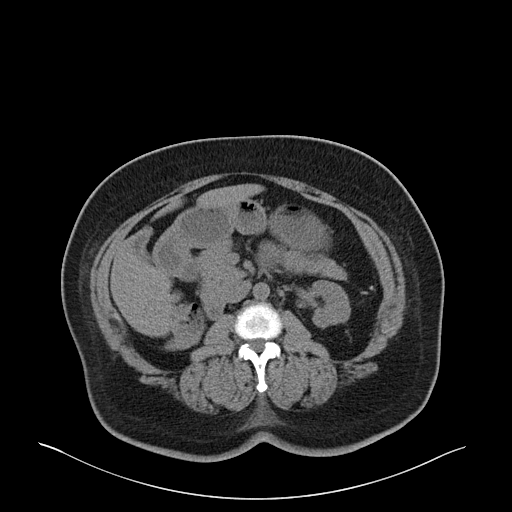
[im 62/78  soft-tissue]
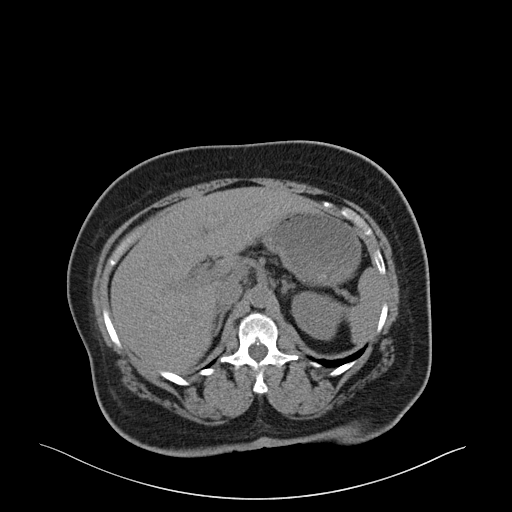
[im 66/78  soft-tissue]
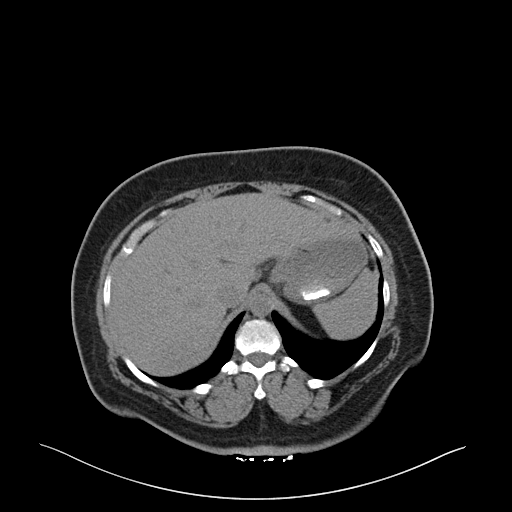
[im 74/78  soft-tissue]
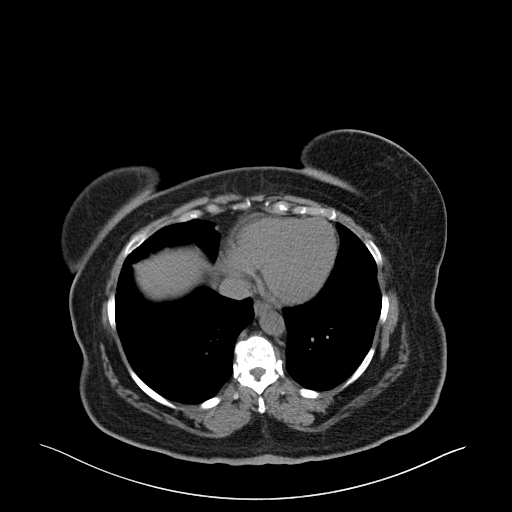

[Series 8: coronal st · coronal · 0.73mm/px · 3 of 107 slices shown]
[im 36/107  soft-tissue]
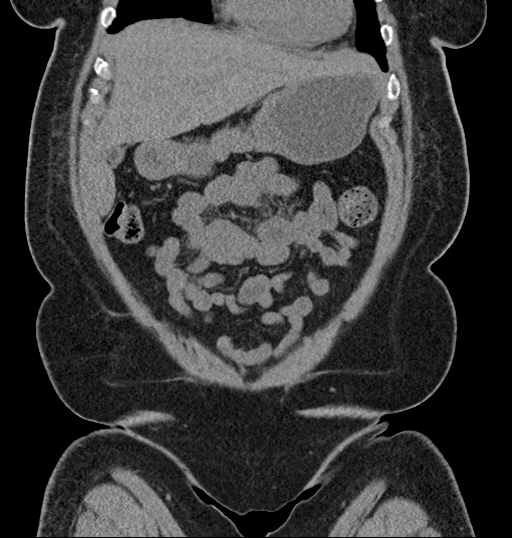
[im 48/107  soft-tissue]
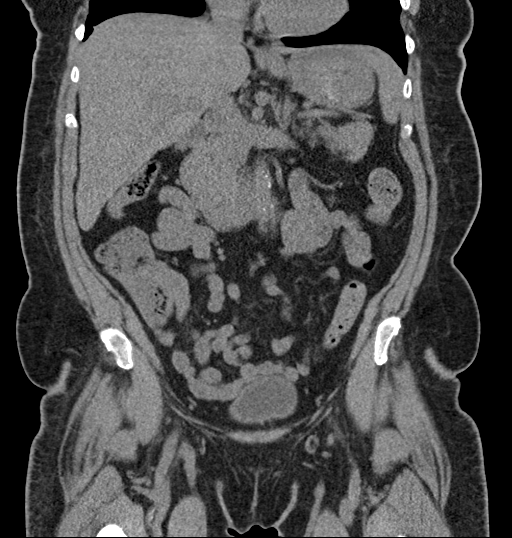
[im 59/107  soft-tissue]
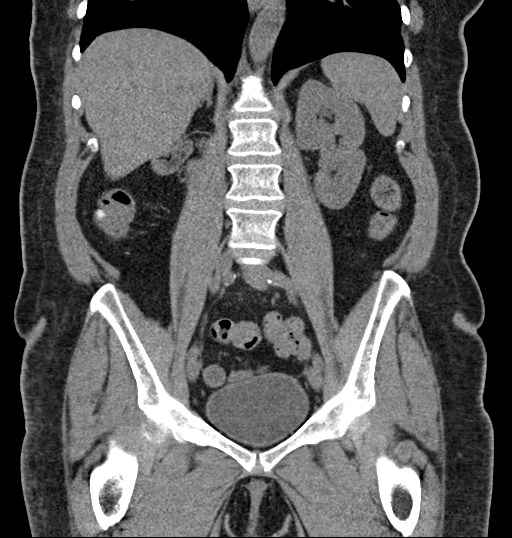

[16 of 46 positions shown; findings below may reference images not displayed]

FINDINGS: Lower chest: Clear lung bases.

Hepatobiliary: Poorly distended gallbladder. Normal appearing liver.

Pancreas: Unremarkable. No pancreatic ductal dilatation or
surrounding inflammatory changes.

Spleen: Normal in size without focal abnormality.

Adrenals/Urinary Tract: Normal appearing adrenal glands. Surgically
absent right kidney and stable surgical absence of a portion of the
left kidney. No left renal mass visualized. Unremarkable urinary
bladder and left ureter.

Stomach/Bowel: Small number of colonic diverticula. Surgically
absent appendix. Unremarkable stomach and small bowel.

Vascular/Lymphatic: Mild atheromatous arterial calcifications. No
enlarged lymph nodes.

Reproductive: Status post hysterectomy. No adnexal masses.

Other: Midline surgical scar with a small supraumbilical incisional
hernia containing fat. There is also a small umbilical hernia
containing fat. Transversely oriented linear areas of increased
density are again demonstrated just beneath the skin in the lower
abdomen bilaterally. These an appearance suggesting scar formation.

Musculoskeletal: Lumbar and lower thoracic spine degenerative
changes. No evidence of bony metastatic disease.
IMPRESSION: 1. Status post right nephrectomy and partial left nephrectomy with
no evidence of tumor recurrence.
2. Mild colonic diverticulosis.
3. Small supraumbilical incisional hernia containing fat and small
umbilical hernia containing fat.

## 2021-02-04 IMAGING — MR MR LUMBAR SPINE W/O CM
4 of 11 series · 17 of 48 positions shown · non-contrast
Comparison: CT abdomen and pelvis 03/25/2018. MRI cervical spine
10/24/2015.

CLINICAL DATA: Back pain. Kidney cancer. RIGHT flank pain. Status
post RIGHT nephrectomy. CT abdomen and pelvis shows no obvious
abnormality.

EXAM:
MRI THORACIC AND LUMBAR SPINE WITHOUT CONTRAST
TECHNIQUE: Multiplanar and multiecho pulse sequences of the thoracic and lumbar
spine were obtained without intravenous contrast.

[Series 7: T2 · sagittal · 3.0mm · 0.61mm/px · 2 of 14 slices shown (1 of 4)]
[im 1/14]
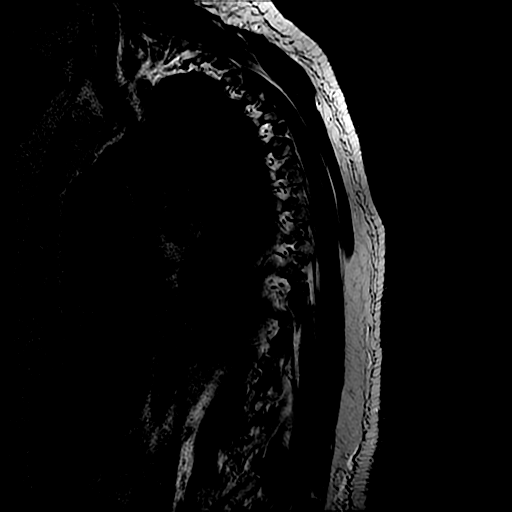
[im 14/14]
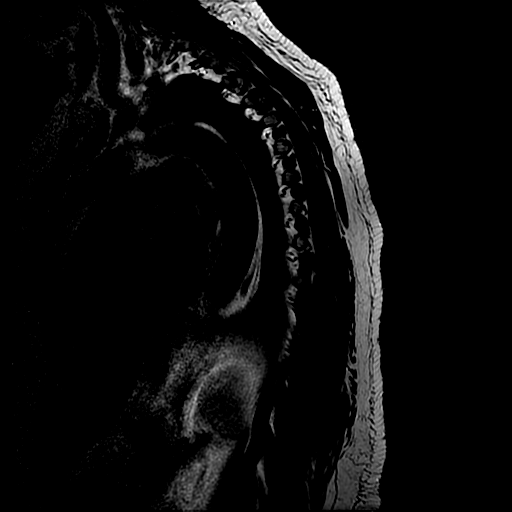

[Series 8: T2 · axial · 4.0mm · 0.39mm/px · z∈[-199,+10]mm · 9 of 52 slices shown (2 of 4)]
[im 1/52]
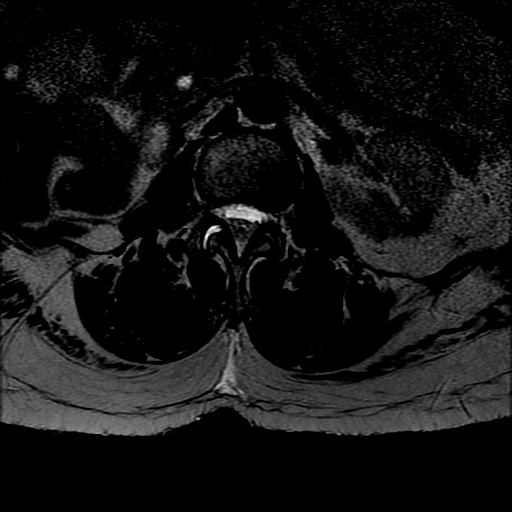
[im 7/52]
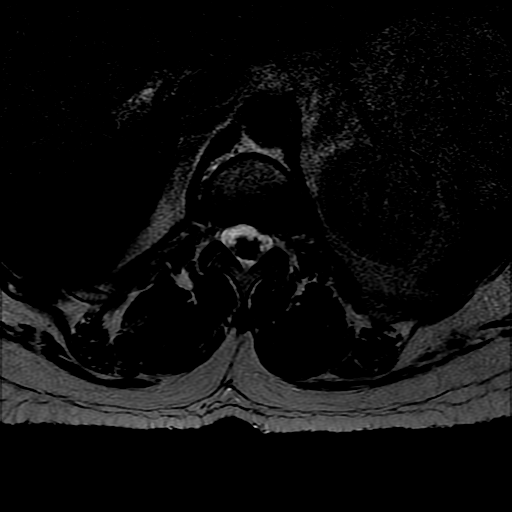
[im 13/52]
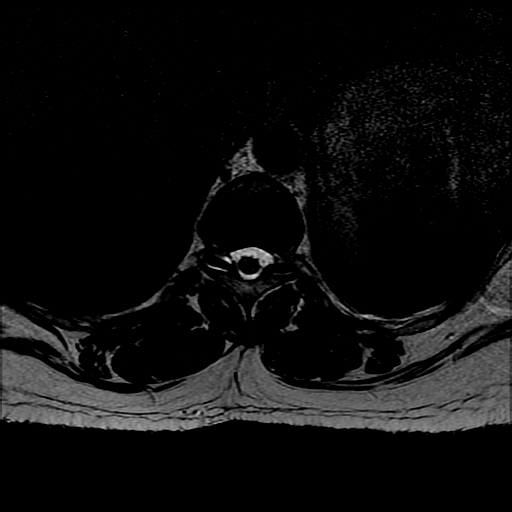
[im 20/52]
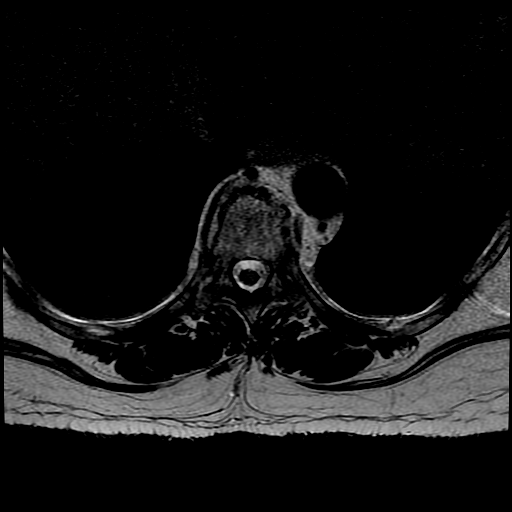
[im 26/52]
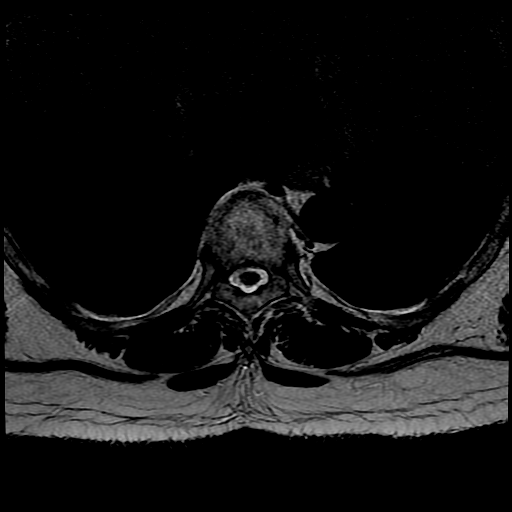
[im 32/52]
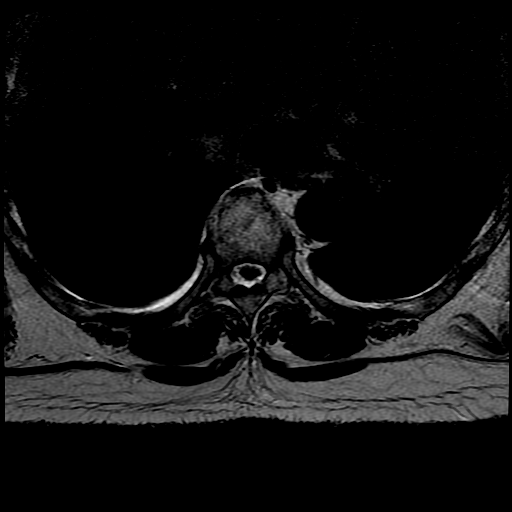
[im 39/52]
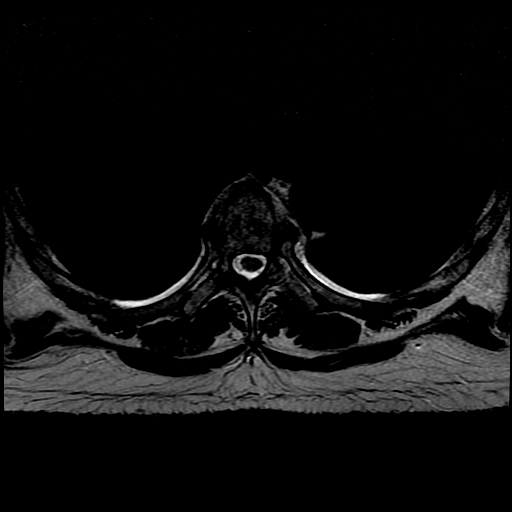
[im 45/52]
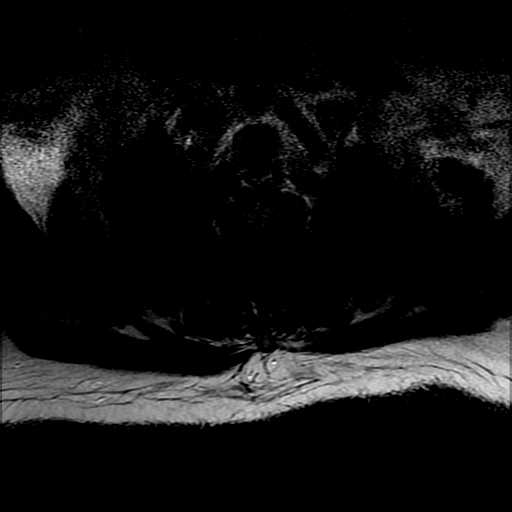
[im 52/52]
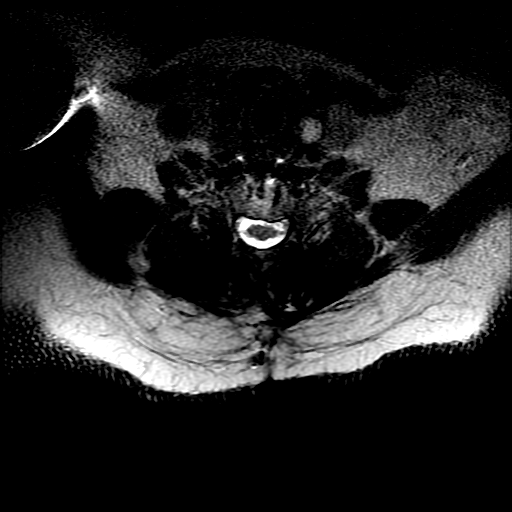

[Series 13: T2 · sagittal · 4.0mm · 0.51mm/px · 2 of 13 slices shown (3 of 4)]
[im 1/13]
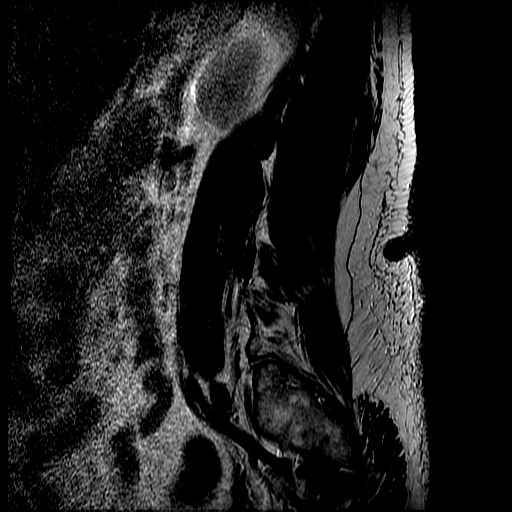
[im 13/13]
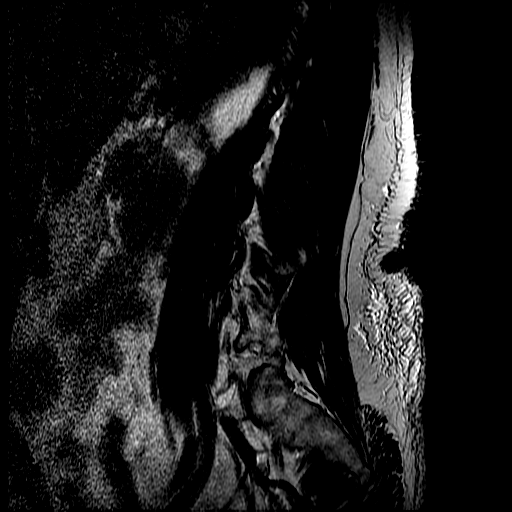

[Series 15: T2 · axial · 4.0mm · 0.39mm/px · z∈[-335,-179]mm · 4 of 37 slices shown (4 of 4)]
[im 1/37]
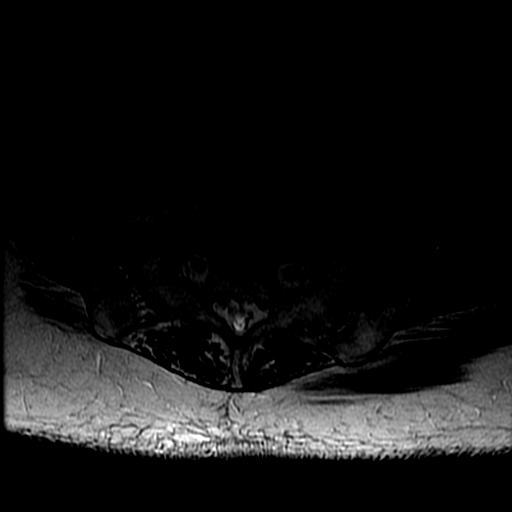
[im 7/37]
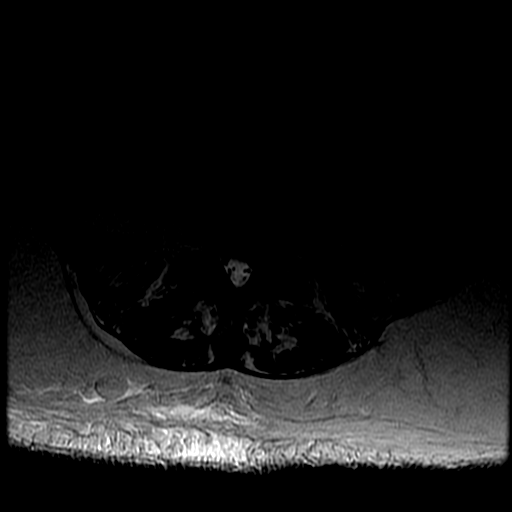
[im 19/37]
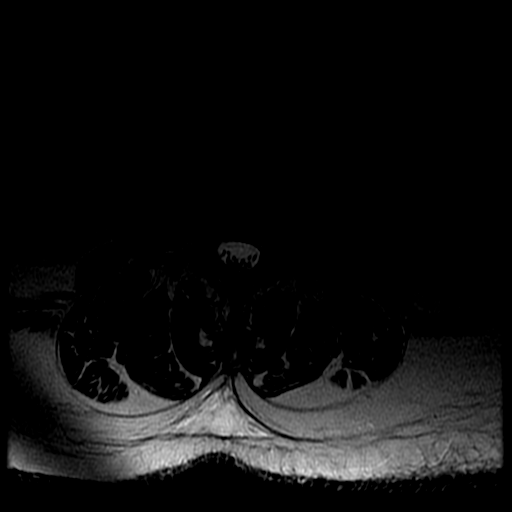
[im 31/37]
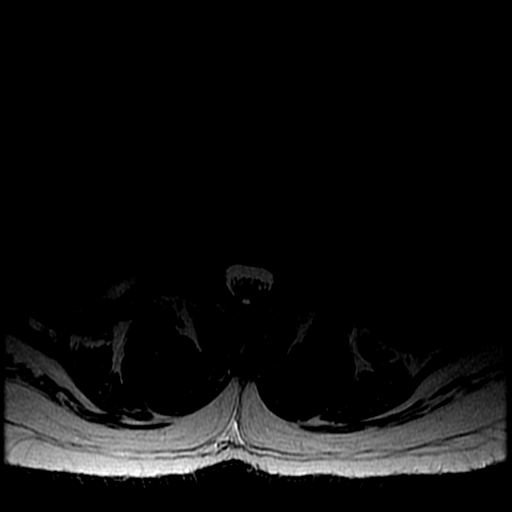

[17 of 48 positions shown; findings below may reference images not displayed]

FINDINGS: MRI THORACIC SPINE FINDINGS

Alignment:  Anatomic

Vertebrae: Disc space narrowing with spontaneous apparent
arthrodesis T4 through T9. T2 and STIR hyperintense endplate signal
above and below T10-11, Modic type 1, without compression fracture,
most consistent with acute degenerative disc disease at T10-11. No
concern for osseous metastatic disease.

Cord: Stenosis with mild cord flattening at multiple levels,
described below. No abnormal cord signal. No intraspinal mass
lesion. No concern for epidural tumor.

Paraspinal and other soft tissues: No visible thoracic pathology.

Disc levels:

The individual disc spaces are examined as follows:

T1-2: Disc space narrowing. Central protrusion. BILATERAL T1
foraminal narrowing may be present.

T2-3: Disc space narrowing. Shallow protrusion. No definite
impingement.

T3-4: Disc space narrowing. Central protrusion. Ligamentum flavum
infolding. Mild stenosis. No definite foraminal narrowing.

T4-5: Osseous spurring. Disc space narrowing. Likely spontaneous
arthrodesis. No impingement.

T5-6: Osseous spurring. Disc space narrowing. Likely spontaneous
arthrodesis. No impingement.

T6-7: Osseous spurring. Disc space narrowing. Likely spontaneous
arthrodesis. No impingement.

T7-8: Osseous spurring. Disc space narrowing. Likely spontaneous
arthrodesis. No impingement.

T8-9: Disc space narrowing. Osseous spurring. Likely spontaneous
arthrodesis. No impingement.

T9-10: Osseous spurring. Disc space narrowing. Likely spontaneous
arthrodesis. No impingement.

T10-11: Disc space narrowing with vacuum phenomenon. Central
protrusion. Posterior element hypertrophy with ligamentum flavum
infolding. Mild stenosis. BILATERAL T10 foraminal narrowing.

T11-12:  Unremarkable.

T12-L1: Unremarkable.

MRI LUMBAR SPINE FINDINGS

Segmentation:  Standard.

Alignment:  Anatomic.

Vertebrae: No worrisome osseous lesion. Modic type 2 changes L5-S1,
chronic disc space narrowing. No evidence of metastatic disease.

Conus medullaris and cauda equina: Conus extends to the L1-L2.
Level. Conus and cauda equina appear normal.

Paraspinal and other soft tissues: Unremarkable. RIGHT nephrectomy.
Partial LEFT nephrectomy.

Disc levels:

L1-L2: Normal disc space. Facet arthropathy and facet joint
effusions, greater on the RIGHT. No impingement.

L2-L3: Annular bulge. Posterior element hypertrophy. No impingement.

L3-L4: Annular bulge. Posterior element hypertrophy. No impingement.

L4-L5: Annular bulge. Posterior element hypertrophy. Mild stenosis.
BILATERAL subarticular zone narrowing could affect either L5 nerve
root. BILATERAL mild foraminal narrowing not clearly compressive.

L5-S1: Disc space narrowing. Osseous spurring. Central and leftward
protrusion. Posterior element hypertrophy. LEFT L5 and LEFT S1 nerve
root impingement are likely.
IMPRESSION: MR THORACIC SPINE IMPRESSION

No metastatic disease or intraspinal mass lesion in the thoracic
spine.

Midthoracic spontaneous arthrodesis across multiple disc spaces due
to chronic degenerative change. Endplate reactive changes above and
below the T10-11 disc space, where there is vacuum phenomenon, could
contribute to mid back pain.

MR LUMBAR SPINE IMPRESSION

Mild multilevel spondylosis. No dominant RIGHT-sided compressive
lesion. No evidence of osseous metastatic disease. Asymmetric facet
arthropathy at L1-2 on the RIGHT, uncertain significance.

## 2021-02-13 ENCOUNTER — Other Ambulatory Visit: Payer: Self-pay | Admitting: Urology

## 2021-02-13 DIAGNOSIS — Z85528 Personal history of other malignant neoplasm of kidney: Secondary | ICD-10-CM

## 2021-02-21 ENCOUNTER — Other Ambulatory Visit: Payer: Self-pay

## 2021-03-05 ENCOUNTER — Other Ambulatory Visit: Payer: Self-pay

## 2021-03-05 ENCOUNTER — Emergency Department (HOSPITAL_COMMUNITY): Payer: Medicare PPO

## 2021-03-05 ENCOUNTER — Encounter (HOSPITAL_COMMUNITY): Payer: Self-pay

## 2021-03-05 ENCOUNTER — Emergency Department (HOSPITAL_COMMUNITY)
Admission: EM | Admit: 2021-03-05 | Discharge: 2021-03-06 | Disposition: A | Discharge status: Home / Self Care | Payer: Medicare PPO | Attending: Emergency Medicine | Admitting: Emergency Medicine

## 2021-03-05 DIAGNOSIS — R42 Dizziness and giddiness: Secondary | ICD-10-CM | POA: Insufficient documentation

## 2021-03-05 DIAGNOSIS — Z79899 Other long term (current) drug therapy: Secondary | ICD-10-CM | POA: Diagnosis not present

## 2021-03-05 DIAGNOSIS — I10 Essential (primary) hypertension: Secondary | ICD-10-CM | POA: Insufficient documentation

## 2021-03-05 DIAGNOSIS — R739 Hyperglycemia, unspecified: Secondary | ICD-10-CM | POA: Diagnosis not present

## 2021-03-05 DIAGNOSIS — E1165 Type 2 diabetes mellitus with hyperglycemia: Secondary | ICD-10-CM | POA: Diagnosis not present

## 2021-03-05 DIAGNOSIS — R1011 Right upper quadrant pain: Secondary | ICD-10-CM | POA: Insufficient documentation

## 2021-03-05 DIAGNOSIS — R350 Frequency of micturition: Secondary | ICD-10-CM | POA: Diagnosis not present

## 2021-03-05 DIAGNOSIS — I700 Atherosclerosis of aorta: Secondary | ICD-10-CM | POA: Diagnosis not present

## 2021-03-05 DIAGNOSIS — R1013 Epigastric pain: Secondary | ICD-10-CM | POA: Diagnosis present

## 2021-03-05 DIAGNOSIS — Z7982 Long term (current) use of aspirin: Secondary | ICD-10-CM | POA: Insufficient documentation

## 2021-03-05 DIAGNOSIS — R001 Bradycardia, unspecified: Secondary | ICD-10-CM | POA: Diagnosis not present

## 2021-03-05 DIAGNOSIS — R109 Unspecified abdominal pain: Secondary | ICD-10-CM | POA: Diagnosis not present

## 2021-03-05 DIAGNOSIS — R079 Chest pain, unspecified: Secondary | ICD-10-CM | POA: Insufficient documentation

## 2021-03-05 DIAGNOSIS — R110 Nausea: Secondary | ICD-10-CM | POA: Diagnosis not present

## 2021-03-05 DIAGNOSIS — K7689 Other specified diseases of liver: Secondary | ICD-10-CM | POA: Diagnosis not present

## 2021-03-05 DIAGNOSIS — R0789 Other chest pain: Secondary | ICD-10-CM | POA: Diagnosis not present

## 2021-03-05 LAB — COMPREHENSIVE METABOLIC PANEL
ALT: 19 U/L (ref 0–44)
AST: 16 U/L (ref 15–41)
Albumin: 4.1 g/dL (ref 3.5–5.0)
Alkaline Phosphatase: 71 U/L (ref 38–126)
Anion gap: 6 (ref 5–15)
BUN: 25 mg/dL — ABNORMAL HIGH (ref 8–23)
CO2: 27 mmol/L (ref 22–32)
Calcium: 9.3 mg/dL (ref 8.9–10.3)
Chloride: 104 mmol/L (ref 98–111)
Creatinine, Ser: 1.97 mg/dL — ABNORMAL HIGH (ref 0.44–1.00)
GFR, Estimated: 25 mL/min — ABNORMAL LOW (ref >60)
Glucose, Bld: 175 mg/dL — ABNORMAL HIGH (ref 70–99)
Potassium: 4.9 mmol/L (ref 3.5–5.1)
Sodium: 137 mmol/L (ref 135–145)
Total Bilirubin: 0.6 mg/dL (ref 0.3–1.2)
Total Protein: 7.6 g/dL (ref 6.5–8.1)

## 2021-03-05 LAB — CBC WITH DIFFERENTIAL/PLATELET
Abs Immature Granulocytes: 0.02 10*3/uL (ref 0.00–0.07)
Basophils Absolute: 0 10*3/uL (ref 0.0–0.1)
Basophils Relative: 1 %
Eosinophils Absolute: 0.2 10*3/uL (ref 0.0–0.5)
Eosinophils Relative: 4 %
HCT: 40.7 % (ref 36.0–46.0)
Hemoglobin: 13.2 g/dL (ref 12.0–15.0)
Immature Granulocytes: 0 %
Lymphocytes Relative: 40 %
Lymphs Abs: 2 10*3/uL (ref 0.7–4.0)
MCH: 30.8 pg (ref 26.0–34.0)
MCHC: 32.4 g/dL (ref 30.0–36.0)
MCV: 95.1 fL (ref 80.0–100.0)
Monocytes Absolute: 0.5 10*3/uL (ref 0.1–1.0)
Monocytes Relative: 10 %
Neutro Abs: 2.2 10*3/uL (ref 1.7–7.7)
Neutrophils Relative %: 45 %
Platelets: 242 10*3/uL (ref 150–400)
RBC: 4.28 MIL/uL (ref 3.87–5.11)
RDW: 13.3 % (ref 11.5–15.5)
WBC: 5 10*3/uL (ref 4.0–10.5)
nRBC: 0 % (ref 0.0–0.2)

## 2021-03-05 LAB — URINALYSIS, ROUTINE W REFLEX MICROSCOPIC
Bilirubin Urine: NEGATIVE
Glucose, UA: 50 mg/dL — AB
Hgb urine dipstick: NEGATIVE
Ketones, ur: NEGATIVE mg/dL
Leukocytes,Ua: NEGATIVE
Nitrite: NEGATIVE
Protein, ur: NEGATIVE mg/dL
Specific Gravity, Urine: 1.014 (ref 1.005–1.030)
pH: 5 (ref 5.0–8.0)

## 2021-03-05 LAB — CBG MONITORING, ED
Glucose-Capillary: 188 mg/dL — ABNORMAL HIGH (ref 70–99)
Glucose-Capillary: 150 mg/dL — ABNORMAL HIGH (ref 70–99)

## 2021-03-05 LAB — TROPONIN I (HIGH SENSITIVITY)
Troponin I (High Sensitivity): 3 ng/L (ref <18)
Troponin I (High Sensitivity): 4 ng/L (ref <18)

## 2021-03-05 LAB — LIPASE, BLOOD: Lipase: 50 U/L (ref 11–51)

## 2021-03-05 MED ORDER — ALUM & MAG HYDROXIDE-SIMETH 200-200-20 MG/5ML PO SUSP
30.0000 mL | Freq: Once | ORAL | Status: AC
  Administered 2021-03-05: 30 mL via ORAL
  Filled 2021-03-05: qty 30

## 2021-03-05 MED ORDER — LACTATED RINGERS IV BOLUS
500.0000 mL | Freq: Once | INTRAVENOUS | Status: AC
  Administered 2021-03-05: 500 mL via INTRAVENOUS

## 2021-03-05 MED ORDER — METOPROLOL SUCCINATE ER 50 MG PO TB24
100.0000 mg | ORAL_TABLET | Freq: Every day | ORAL | Status: DC
  Administered 2021-03-05: 100 mg via ORAL
  Filled 2021-03-05: qty 2

## 2021-03-05 MED ORDER — LIDOCAINE VISCOUS HCL 2 % MT SOLN
15.0000 mL | Freq: Once | OROMUCOSAL | Status: AC
  Administered 2021-03-05: 15 mL via ORAL
  Filled 2021-03-05: qty 15

## 2021-03-05 MED ORDER — ONDANSETRON HCL 4 MG/2ML IJ SOLN
4.0000 mg | Freq: Once | INTRAMUSCULAR | Status: AC
  Administered 2021-03-05: 4 mg via INTRAVENOUS
  Filled 2021-03-05: qty 2

## 2021-03-05 MED ORDER — METOPROLOL SUCCINATE ER 50 MG PO TB24: 100.0000 mg | ORAL_TABLET | Freq: Every evening | ORAL | Status: DC

## 2021-03-05 NOTE — ED Triage Notes (Addendum)
Patient c/o RUQ abdominal pain, chest pain, and nausea x 2 weeks. Patient states her blood sugar was 61 this AM and last night the blood sugar was 521.  Patient added during triage that she I shaving urinary frequency, and states she has had an intermittent small amount of bright red blood x 1 month.

## 2021-03-05 NOTE — ED Provider Notes (Signed)
[facility] DEPT Provider Note   CSN: [id number] Arrival date & time: 03/05/21  1423     History  Chief Complaint  Patient presents with   Hyperglycemia   Abdominal Pain   Chest Pain   Dizziness   Blood In Stools    [name] is a 81 y.o. female.   Hyperglycemia Associated symptoms: abdominal pain, chest pain, dizziness and nausea   Abdominal Pain Associated symptoms: chest pain and nausea   Chest Pain Associated symptoms: abdominal pain, dizziness and nausea   Dizziness Associated symptoms: chest pain and nausea    81 year old female presenting to the emergency department with epigastric abdominal pain with associated nausea for the past 3 weeks.  She states that the pain is burning in sensation and radiates to her chest.  Additionally, she states that her blood sugars have been labile at home.  She noticed a blood glucose of 61 this morning and last night her blood sugar was 521.  She endorses some increased urinary frequency.  She states that she had some bright red blood in her stool over the past month intermittently but has had none over the past week.  She denies any melena.  She denies any fevers or chills.  She denies any shortness of breath.  She states that she has a history of partial nephrectomy.  Home Medications Prior to Admission medications   Medication Sig Start Date End Date Taking? Authorizing Provider  acetaminophen (TYLENOL 8 HOUR) 650 MG CR tablet Take 1 tablet (650 mg total) by mouth every 8 (eight) hours as needed for pain. 02/18/20  Yes Aberman, Caroline C, PA-C  albuterol (PROVENTIL,VENTOLIN) 90 MCG/ACT inhaler Inhale 2 puffs into the lungs every 4 (four) hours as needed for wheezing or shortness of breath.    Yes [provider]  amLODipine (NORVASC) 5 MG tablet TAKE 1 TABLET BY MOUTH EVERY DAY 06/28/19  Yes Patwardhan, Manish J, MD  aspirin EC 81 MG tablet Take 81 mg by mouth at bedtime.   Yes [provider]  atorvastatin (LIPITOR) 10 MG tablet Take 10 mg by mouth every morning.   Yes [provider]  cholecalciferol (VITAMIN D) 1000 units tablet Take 1,000 Units by mouth daily.   Yes [provider]  donepezil (ARICEPT) 10 MG tablet Take 1 tablet (10 mg total) by mouth at bedtime. Must be seen for further refills. Call [phone]. 12/27/20  Yes Ward Givens, NP  fluticasone (FLONASE) 50 MCG/ACT nasal spray Place 1 spray into both nostrils daily. Patient taking differently: Place 1 spray into both nostrils daily as needed for allergies. 08/27/20  Yes Providence Lanius A, PA-C  gabapentin (NEURONTIN) 300 MG capsule Take 1 capsule (300 mg total) by mouth at bedtime. Patient taking differently: Take 300 mg by mouth 2 (two) times daily. 07/16/16  Yes Regal, Tamala Fothergill, DPM  insulin degludec (TRESIBA) 200 UNIT/ML FlexTouch Pen Inject 80 Units into the skin daily before breakfast.   Yes [provider]  metoprolol succinate (TOPROL-XL) 100 MG 24 hr tablet TAKE 1 TABLET (100 MG TOTAL) BY MOUTH DAILY Patient taking differently: 100 mg every evening. 10/25/20  Yes [name], MD  Multiple Vitamins-Minerals (CENTRUM SILVER ADULT 50+ PO) Take 1 tablet by mouth daily.   Yes [provider]  nitroGLYCERIN (NITROSTAT) 0.4 MG SL tablet Place 0.4 mg under the tongue every 5 (five) minutes as needed for chest pain.   Yes [provider]  pantoprazole (PROTONIX) 40 MG tablet  Take 40 mg by mouth daily as needed (heartburn).  03/25/19  Yes [provider]  TRADJENTA 5 MG TABS tablet Take 5 mg by mouth daily. 06/24/19  Yes [provider]  lidocaine (LIDODERM) 5 % Place 1 patch onto the skin daily. Remove & Discard patch within 12 hours or as directed by MD Patient not taking: Reported on 06/01/2020 03/27/18   Reyne Dumas, MD  methocarbamol (ROBAXIN) 500 MG tablet Take 1 tablet (500 mg total) by mouth 2 (two) times daily. Patient not taking: Reported on  06/01/2020 02/18/20   Suzy Bouchard, PA-C  methylPREDNISolone (MEDROL DOSEPAK) 4 MG TBPK tablet Take as instructed Patient not taking: Reported on 06/01/2020 05/04/20   Valarie Merino, MD  ondansetron (ZOFRAN ODT) 4 MG disintegrating tablet Take 1 tablet (4 mg total) by mouth every 8 (eight) hours as needed for nausea or vomiting. Patient not taking: Reported on 03/05/2021 08/27/20   Providence Lanius A, PA-C  oxyCODONE (ROXICODONE) 5 MG immediate release tablet Take 1 tablet (5 mg total) by mouth every 6 (six) hours as needed for severe pain. Patient not taking: Reported on 06/01/2020 05/04/20   Valarie Merino, MD  oxyCODONE-acetaminophen (PERCOCET) 5-325 MG tablet Take 1 tablet by mouth every 4 (four) hours as needed for moderate pain or severe pain. Patient not taking: Reported on 03/05/2021 06/01/20   Daleen Bo, MD      Allergies    Sulfa antibiotics    Review of Systems   Review of Systems  Cardiovascular:  Positive for chest pain.  Gastrointestinal:  Positive for abdominal pain and nausea.  Neurological:  Positive for dizziness.  All other systems reviewed and are negative.  Physical Exam Updated Vital Signs BP (!) 179/65    Pulse (!) 59    Temp (!) 97.4 F (36.3 C) (Oral)    Resp 17    Ht 4\' 11"  (1.499 m)    Wt 67.1 kg    SpO2 99%    BMI 29.89 kg/m  Physical Exam Vitals and nursing note reviewed.  Constitutional:      General: She is not in acute distress.    Appearance: She is well-developed.  HENT:     Head: Normocephalic and atraumatic.  Eyes:     Conjunctiva/sclera: Conjunctivae normal.     Pupils: Pupils are equal, round, and reactive to light.  Cardiovascular:     Rate and Rhythm: Normal rate and regular rhythm.     Heart sounds: No murmur heard. Pulmonary:     Effort: Pulmonary effort is normal. No respiratory distress.     Breath sounds: Normal breath sounds.  Abdominal:     General: There is no distension.     Palpations: Abdomen is soft.      Tenderness: There is abdominal tenderness in the epigastric area. There is no guarding.  Musculoskeletal:        General: No swelling, deformity or signs of injury.     Cervical back: Neck supple.  Skin:    General: Skin is warm and dry.     Capillary Refill: Capillary refill takes less than 2 seconds.     Findings: No lesion or rash.  Neurological:     General: No focal deficit present.     Mental Status: She is alert. Mental status is at baseline.  Psychiatric:        Mood and Affect: Mood normal.    ED Results / Procedures / Treatments   Labs (all labs ordered  are listed, but only abnormal results are displayed) Labs Reviewed  URINALYSIS, ROUTINE W REFLEX MICROSCOPIC - Abnormal; Notable for the following components:      Result Value   Glucose, UA 50 (*)    All other components within normal limits  COMPREHENSIVE METABOLIC PANEL - Abnormal; Notable for the following components:   Glucose, Bld 175 (*)    BUN 25 (*)    Creatinine, Ser 1.97 (*)    GFR, Estimated 25 (*)    All other components within normal limits  CBG MONITORING, ED - Abnormal; Notable for the following components:   Glucose-Capillary 188 (*)    All other components within normal limits  CBG MONITORING, ED - Abnormal; Notable for the following components:   Glucose-Capillary 150 (*)    All other components within normal limits  CBC WITH DIFFERENTIAL/PLATELET  LIPASE, BLOOD  TROPONIN I (HIGH SENSITIVITY)  TROPONIN I (HIGH SENSITIVITY)    EKG EKG Interpretation  Date/Time:  Monday March 05 2021 22:20:10 EST Ventricular Rate:  60 PR Interval:  183 QRS Duration: 80 QT Interval:  411 QTC Calculation: 411 R Axis:   64 Text Interpretation: EASI Derived Leads Sinus rhythm T wave inversions in inferior leads No STEMI Confirmed by Regan Lemming ([id number]) on 03/06/2021 12:06:42 AM  Radiology CT ABDOMEN PELVIS WO CONTRAST  Result Date: 03/05/2021 CLINICAL DATA:  Abdominal pain and blood in stool. EXAM: CT  ABDOMEN AND PELVIS WITHOUT CONTRAST TECHNIQUE: Multidetector CT imaging of the abdomen and pelvis was performed following the standard protocol without IV contrast. RADIATION DOSE REDUCTION: This exam was performed according to the departmental dose-optimization program which includes automated exposure control, adjustment of the mA and/or kV according to patient size and/or use of iterative reconstruction technique. COMPARISON:  02/17/2020. FINDINGS: Lower chest: Minimal atelectasis or scarring at the lung bases. Hepatobiliary: No focal liver abnormality is seen. No gallstones, gallbladder wall thickening, or biliary dilatation. Pancreas: Unremarkable. No pancreatic ductal dilatation or surrounding inflammatory changes. Spleen: Normal in size without focal abnormality. Adrenals/Urinary Tract: The adrenal glands are within normal limits. Right nephrectomy changes are noted. No renal or ureteral calculus or obstructive uropathy on the left. The bladder is within normal limits. Stomach/Bowel: The stomach is within normal limits. No bowel obstruction, free air or pneumatosis. A few scattered diverticula are present along the colon without evidence of diverticulitis. No focal bowel wall thickening. The appendix is not visualized on exam, however no inflammatory changes are present in the right lower quadrant Vascular/Lymphatic: Aortic atherosclerosis. No enlarged abdominal or pelvic lymph nodes. Reproductive: Status post hysterectomy. No adnexal masses. Other: Small fat containing umbilical hernia.  No free fluid. Musculoskeletal: Degenerative changes are present in the thoracolumbar spine. No acute osseous abnormality. IMPRESSION: 1. No acute intra-abdominal process. 2. Diverticulosis without diverticulitis. 3. Status post right nephrectomy. 4. Aortic atherosclerosis. Electronically Signed   By: Brett Fairy M.D.   On: 03/05/2021 21:47   DG Chest 2 View  Result Date: 03/05/2021 CLINICAL DATA:  Chest pain and  nausea for 2 weeks. EXAM: CHEST - 2 VIEW COMPARISON:  August 27, 2020 FINDINGS: The heart size and mediastinal contours are within normal limits. Both lungs are clear. The visualized skeletal structures are stable. IMPRESSION: No active cardiopulmonary disease. Electronically Signed   By: Abelardo Diesel M.D.   On: 03/05/2021 15:38   US Abdomen Limited RUQ (LIVER/GB)  Result Date: 03/05/2021 CLINICAL DATA:  Right upper quadrant pain for 2 weeks EXAM: ULTRASOUND ABDOMEN LIMITED RIGHT UPPER QUADRANT COMPARISON:  None. FINDINGS: Gallbladder: No gallstones or wall thickening visualized. No sonographic Murphy sign noted by sonographer. Common bile duct: Diameter: 4.2 mm. Liver: Small 6 mm cyst is noted adjacent to the gallbladder fossa. No other focal abnormality is noted. Portal vein is patent on color Doppler imaging with normal direction of blood flow towards the liver. Other: None. IMPRESSION: Small hepatic cyst.  No other focal abnormality is noted. Electronically Signed   By: Inez Catalina M.D.   On: 03/05/2021 22:37    Procedures Procedures    Medications Ordered in ED Medications  lactated ringers bolus 500 mL (0 mLs Intravenous Stopped 03/05/21 2340)  ondansetron (ZOFRAN) injection 4 mg (4 mg Intravenous Given 03/05/21 2225)  alum & mag hydroxide-simeth (MAALOX/MYLANTA) 200-200-20 MG/5ML suspension 30 mL (30 mLs Oral Given 03/05/21 2342)    And  lidocaine (XYLOCAINE) 2 % viscous mouth solution 15 mL (15 mLs Oral Given 03/05/21 2342)    ED Course/ Medical Decision Making/ A&P                           Medical Decision Making Amount and/or Complexity of Data Reviewed Radiology: ordered.  Risk OTC drugs. Prescription drug management.   81 year old female presenting to the emergency department with epigastric abdominal pain with associated nausea for the past 3 weeks.  She states that the pain is burning in sensation and radiates to her chest.  Additionally, she states that her blood sugars  have been labile at home.  She noticed a blood glucose of 61 this morning and last night her blood sugar was 521.  She endorses some increased urinary frequency.  She states that she had some bright red blood in her stool over the past month intermittently but has had none over the past week.  She denies any melena.  She denies any fevers or chills.  She denies any shortness of breath.  She states that she has a history of partial nephrectomy.  On arrival, the patient was afebrile, mildly bradycardic, P 59, mildly hypertensive BP 145/68, saturating 99% on room air.  Sinus bradycardia to normal sinus rhythm intermittently noted on cardiac telemetry.  Differential diagnosis includes gastritis, GERD, ACS, stable angina, cholelithiasis/cholecystitis.  The patient is passing gas and moving her bowels.  She has had no episodes of hematochezia in the past week.  She denies any melena.  Work-up initiated to include an EKG which revealed sinus rhythm with nonspecific T wave changes present.  The patient underwent troponins x2 which were normal.  Her CBG on arrival was 188 and on recheck following fluids was 150.  Her lipase was normal.  Her CBC was without a leukocytosis, anemia or platelet abnormality, her CMP did not reveal significant electrolyte dysfunction, it did reveal a creatinine of 1.97 from a baseline of around 1.78.  Urinalysis did not convincingly reveal evidence of UTI with negative hemoglobin, negative protein, negative nitrites and leukocytes, 6-10 WBCs, no bacteria seen.  Her increased urinary frequency is likely due to hyperglycemia.  She has no evidence of an anion gap acidosis at this time.  Low concern for DKA or HHS.  Her blood sugar appears to be moderately well controlled today.  She was administered  Maalox and lidocaine with some improvement.  An abdominal ultrasound revealed a small hepatic cyst with no focal abnormality.  A CT abdomen pelvis without contrast due to her kidney function revealed  no acute intra-abdominal process with diverticulosis without evidence of diverticulitis,  status post right nephrectomy.  The patient has been appropriately medically screened and/or stabilized in the ED. I have low suspicion for any other emergent medical condition which would require further screening, evaluation or treatment in the ED or require inpatient management.  Symptoms are most consistent with likely GERD versus gastritis.  Advised that the patient follow-up with her PCP regarding her blood sugar management outpatient.  Advised that she follow-up with a cardiologist outpatient in clinic for consideration for stress test.  Overall stable for discharge at this time.  DC Instructions: Your exam/testing today was overall reassuring.  Your cardiac enzymes were reassuring.  Your abdominal CT and right upper quadrant ultrasound also did not reveal acute abnormalities.  Recommend you follow-up with a cardiologist outpatient in clinic for consideration for a stress test.  Call your PCP in the morning to discuss your recent labile blood sugars.   Final Clinical Impression(s) / ED Diagnoses Final diagnoses:  RUQ pain  Nonspecific chest pain    Rx / DC Orders ED Discharge Orders          Ordered    Ambulatory referral to Cardiology        03/06/21 0011              Regan Lemming, MD 03/07/21 1429

## 2021-03-05 NOTE — ED Provider Triage Note (Signed)
Emergency Medicine Provider Triage Evaluation Note  [name] , a 81 y.o. female  was evaluated in triage.  Pt complains of chest pain, lightheadedness, blood in her stool, and abdominal pain of 3-week duration.  She denies nausea, vomiting, fever.  She also reports she has solitary kidney.  States her symptoms are about the same since onset.  Chest pain is substernal and does not radiate.  Review of Systems  Positive: As above Negative: As above  Physical Exam  BP (!) 145/68 (BP Location: Right Arm)    Pulse (!) 59    Temp (!) 97.1 F (36.2 C) (Oral)    Resp (!) 9    Ht 4\' 11"  (1.499 m)    Wt 67.1 kg    SpO2 99%    BMI 29.89 kg/m  Gen:   Awake, no distress   Resp:  Normal effort MSK:   Moves extremities without difficulty  Other:    Medical Decision Making  Medically screening exam initiated at 2:53 PM.  Appropriate orders placed.  [name] was informed that the remainder of the evaluation will be completed by another provider, this initial triage assessment does not replace that evaluation, and the importance of remaining in the ED until their evaluation is complete.     Evlyn Courier, PA-C 03/05/21 1455

## 2021-03-05 NOTE — ED Notes (Signed)
Patient daughter called for an update: Selinda Eon [phone]

## 2021-03-06 NOTE — Discharge Instructions (Addendum)
You were evaluated in the Emergency Department and after careful evaluation, we did not find any emergent condition requiring admission or further testing in the hospital.  Your exam/testing today was overall reassuring.  Your cardiac enzymes were reassuring.  Your abdominal CT and right upper quadrant ultrasound also did not reveal acute abnormalities.  Recommend you follow-up with a cardiologist outpatient in clinic for consideration for a stress test.  Call your PCP in the morning to discuss your recent labile blood sugars.  Please return to the Emergency Department if you experience any worsening of your condition.  Thank you for allowing [place] to be a part of your care.

## 2021-03-12 DIAGNOSIS — C642 Malignant neoplasm of left kidney, except renal pelvis: Secondary | ICD-10-CM | POA: Diagnosis not present

## 2021-03-12 DIAGNOSIS — E1122 Type 2 diabetes mellitus with diabetic chronic kidney disease: Secondary | ICD-10-CM | POA: Diagnosis not present

## 2021-03-12 DIAGNOSIS — C641 Malignant neoplasm of right kidney, except renal pelvis: Secondary | ICD-10-CM | POA: Diagnosis not present

## 2021-03-12 DIAGNOSIS — N2581 Secondary hyperparathyroidism of renal origin: Secondary | ICD-10-CM | POA: Diagnosis not present

## 2021-03-12 DIAGNOSIS — D631 Anemia in chronic kidney disease: Secondary | ICD-10-CM | POA: Diagnosis not present

## 2021-03-12 DIAGNOSIS — I129 Hypertensive chronic kidney disease with stage 1 through stage 4 chronic kidney disease, or unspecified chronic kidney disease: Secondary | ICD-10-CM | POA: Diagnosis not present

## 2021-03-12 DIAGNOSIS — N184 Chronic kidney disease, stage 4 (severe): Secondary | ICD-10-CM | POA: Diagnosis not present

## 2021-03-12 DIAGNOSIS — E119 Type 2 diabetes mellitus without complications: Secondary | ICD-10-CM | POA: Diagnosis not present

## 2021-03-12 DIAGNOSIS — M4800 Spinal stenosis, site unspecified: Secondary | ICD-10-CM | POA: Diagnosis not present

## 2021-03-12 DIAGNOSIS — N189 Chronic kidney disease, unspecified: Secondary | ICD-10-CM | POA: Diagnosis not present

## 2021-03-13 DIAGNOSIS — E1169 Type 2 diabetes mellitus with other specified complication: Secondary | ICD-10-CM | POA: Diagnosis not present

## 2021-03-13 DIAGNOSIS — Z20822 Contact with and (suspected) exposure to covid-19: Secondary | ICD-10-CM | POA: Diagnosis not present

## 2021-03-13 DIAGNOSIS — K580 Irritable bowel syndrome with diarrhea: Secondary | ICD-10-CM | POA: Diagnosis not present

## 2021-03-13 DIAGNOSIS — I10 Essential (primary) hypertension: Secondary | ICD-10-CM | POA: Diagnosis not present

## 2021-03-20 ENCOUNTER — Other Ambulatory Visit: Payer: Self-pay | Admitting: Adult Health

## 2021-03-20 ENCOUNTER — Other Ambulatory Visit: Payer: Self-pay

## 2021-03-20 ENCOUNTER — Ambulatory Visit
Admission: RE | Admit: 2021-03-20 | Discharge: 2021-03-20 | Disposition: A | Discharge status: Home / Self Care | Payer: Medicare PPO | Source: Ambulatory Visit | Attending: Urology | Admitting: Urology

## 2021-03-20 DIAGNOSIS — Z905 Acquired absence of kidney: Secondary | ICD-10-CM | POA: Diagnosis not present

## 2021-03-20 DIAGNOSIS — Z85528 Personal history of other malignant neoplasm of kidney: Secondary | ICD-10-CM | POA: Diagnosis not present

## 2021-03-20 DIAGNOSIS — M47816 Spondylosis without myelopathy or radiculopathy, lumbar region: Secondary | ICD-10-CM | POA: Diagnosis not present

## 2021-03-20 DIAGNOSIS — R109 Unspecified abdominal pain: Secondary | ICD-10-CM | POA: Diagnosis not present

## 2021-03-20 MED ORDER — GADOBENATE DIMEGLUMINE 529 MG/ML IV SOLN
13.0000 mL | Freq: Once | INTRAVENOUS | Status: AC | PRN
  Administered 2021-03-20: 13 mL via INTRAVENOUS

## 2021-04-11 ENCOUNTER — Other Ambulatory Visit: Payer: Self-pay

## 2021-04-11 ENCOUNTER — Ambulatory Visit (HOSPITAL_COMMUNITY)
Admission: RE | Admit: 2021-04-11 | Discharge: 2021-04-11 | Disposition: A | Discharge status: Home / Self Care | Payer: Medicare PPO | Source: Ambulatory Visit | Attending: Urology | Admitting: Urology

## 2021-04-11 ENCOUNTER — Other Ambulatory Visit (HOSPITAL_COMMUNITY): Payer: Self-pay | Admitting: Urology

## 2021-04-11 DIAGNOSIS — Z85528 Personal history of other malignant neoplasm of kidney: Secondary | ICD-10-CM | POA: Diagnosis not present

## 2021-04-11 DIAGNOSIS — Z9889 Other specified postprocedural states: Secondary | ICD-10-CM | POA: Diagnosis not present

## 2021-04-11 DIAGNOSIS — Z981 Arthrodesis status: Secondary | ICD-10-CM | POA: Diagnosis not present

## 2021-04-11 DIAGNOSIS — C649 Malignant neoplasm of unspecified kidney, except renal pelvis: Secondary | ICD-10-CM | POA: Diagnosis not present

## 2021-04-18 DIAGNOSIS — N3946 Mixed incontinence: Secondary | ICD-10-CM | POA: Diagnosis not present

## 2021-04-18 DIAGNOSIS — Z85528 Personal history of other malignant neoplasm of kidney: Secondary | ICD-10-CM | POA: Diagnosis not present

## 2021-05-08 DIAGNOSIS — E1122 Type 2 diabetes mellitus with diabetic chronic kidney disease: Secondary | ICD-10-CM | POA: Diagnosis not present

## 2021-05-08 DIAGNOSIS — M25562 Pain in left knee: Secondary | ICD-10-CM | POA: Diagnosis not present

## 2021-05-08 DIAGNOSIS — M150 Primary generalized (osteo)arthritis: Secondary | ICD-10-CM | POA: Diagnosis not present

## 2021-05-08 DIAGNOSIS — I10 Essential (primary) hypertension: Secondary | ICD-10-CM | POA: Diagnosis not present

## 2021-05-08 DIAGNOSIS — Z6829 Body mass index (BMI) 29.0-29.9, adult: Secondary | ICD-10-CM | POA: Diagnosis not present

## 2021-05-08 DIAGNOSIS — E1165 Type 2 diabetes mellitus with hyperglycemia: Secondary | ICD-10-CM | POA: Diagnosis not present

## 2021-05-08 DIAGNOSIS — N184 Chronic kidney disease, stage 4 (severe): Secondary | ICD-10-CM | POA: Diagnosis not present

## 2021-05-14 IMAGING — MG DIGITAL SCREENING BILATERAL MAMMOGRAM WITH TOMO AND CAD
6 of 10 series · 6 of 30 positions shown · non-contrast
Comparison: Previous exam(s).

CLINICAL DATA: Screening.

EXAM:
DIGITAL SCREENING BILATERAL MAMMOGRAM WITH TOMO AND CAD

[L MLO synth-2D]
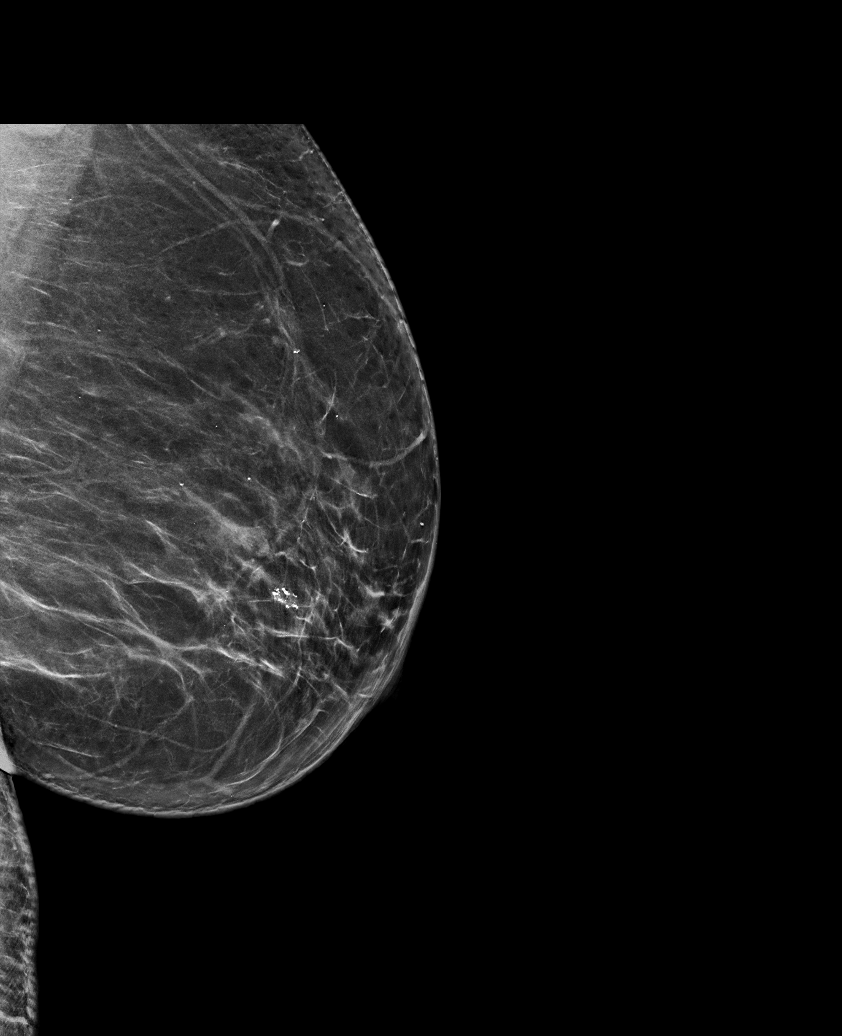

[R MLO synth-2D (1 of 2)]
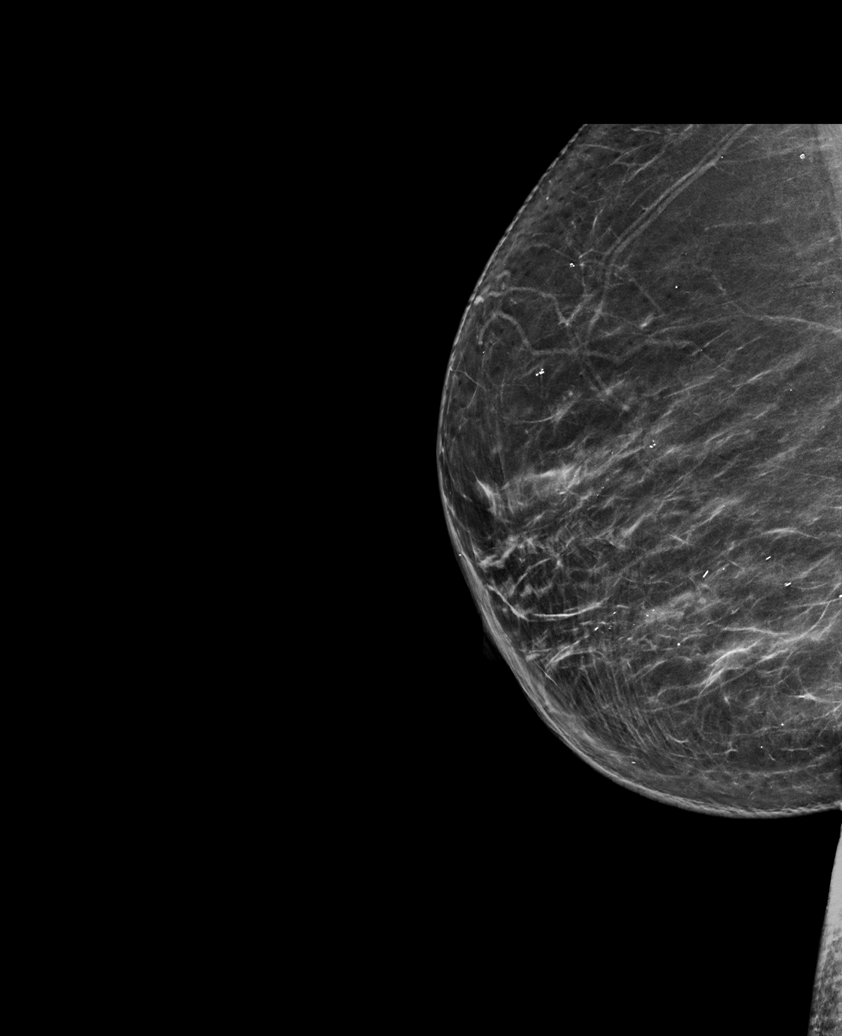

[L CC synth-2D]
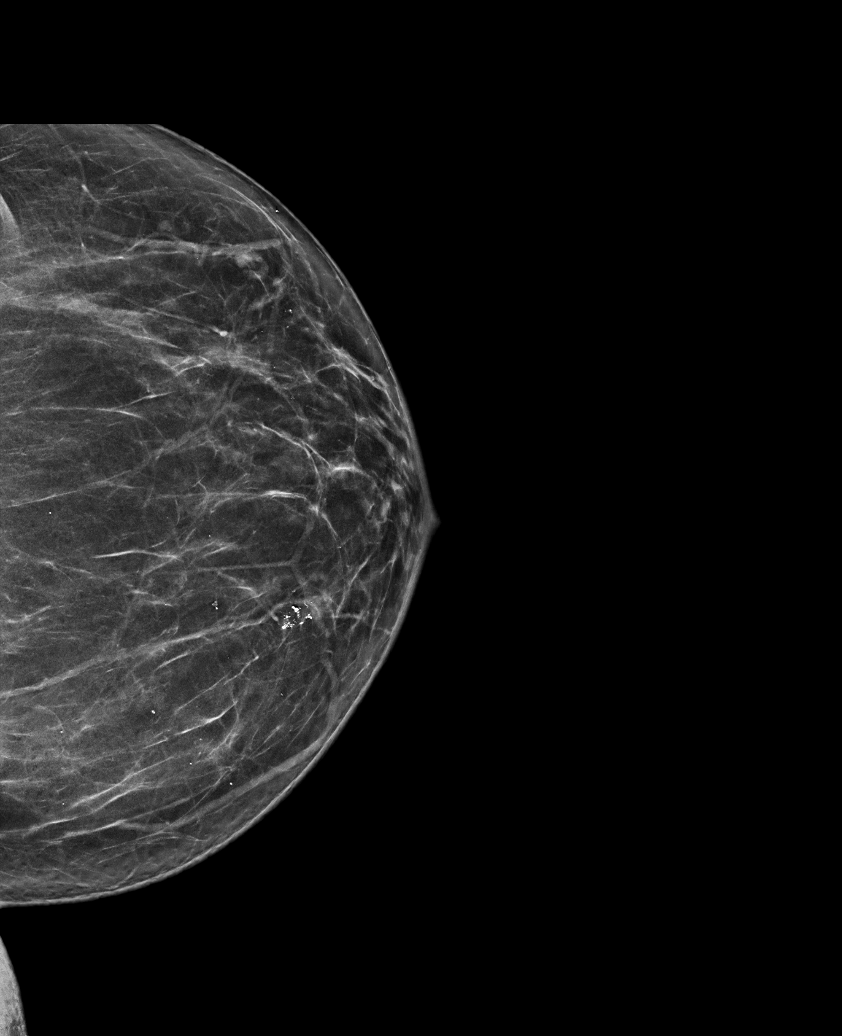

[R MLO synth-2D (2 of 2)]
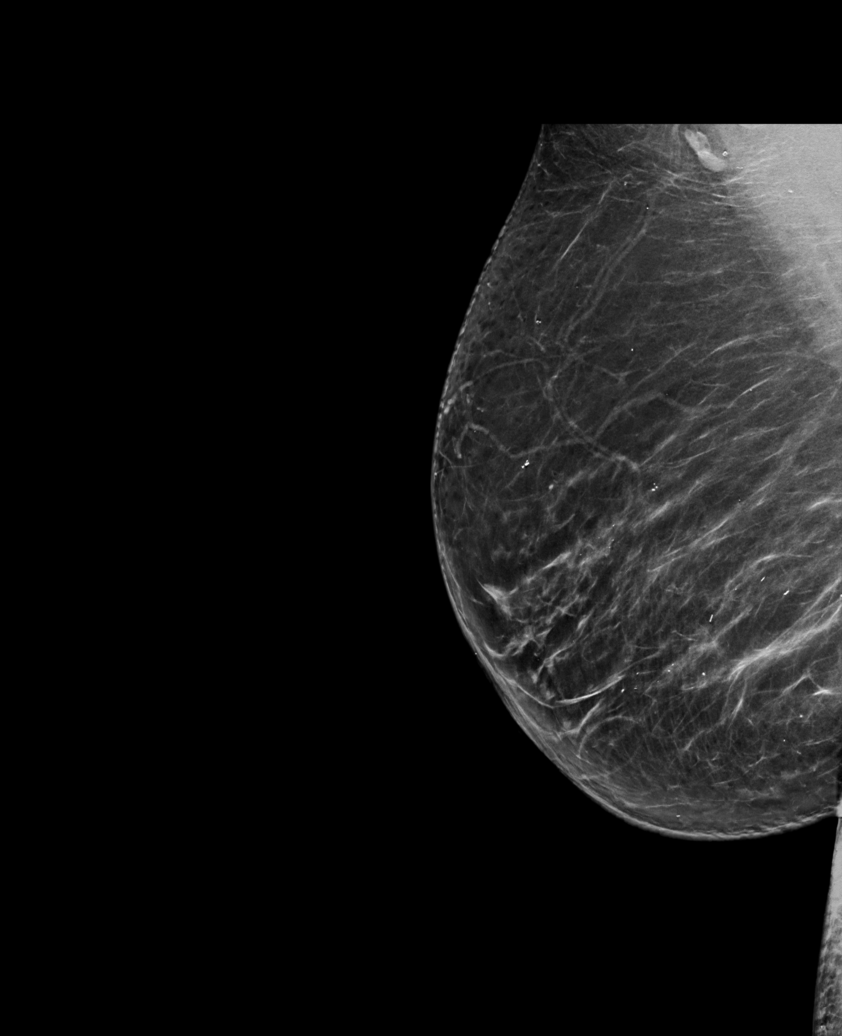

[R CC synth-2D]
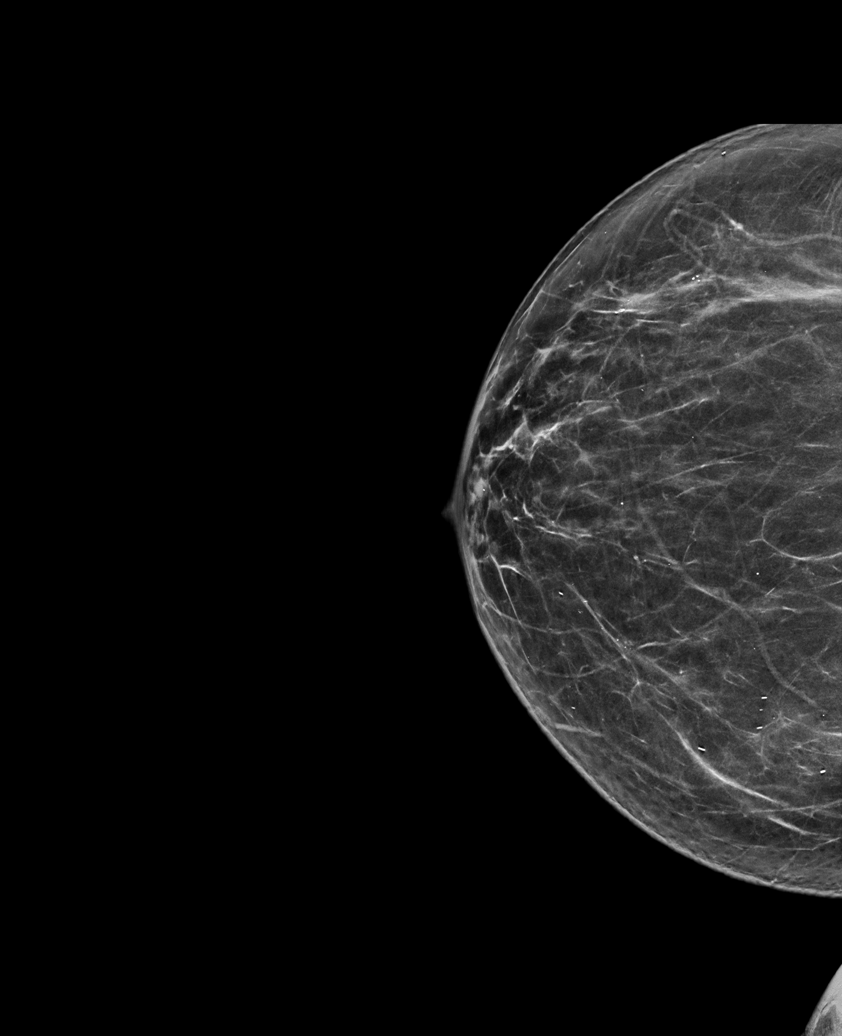

[R CC tomo · tomo slice 36/71.0]
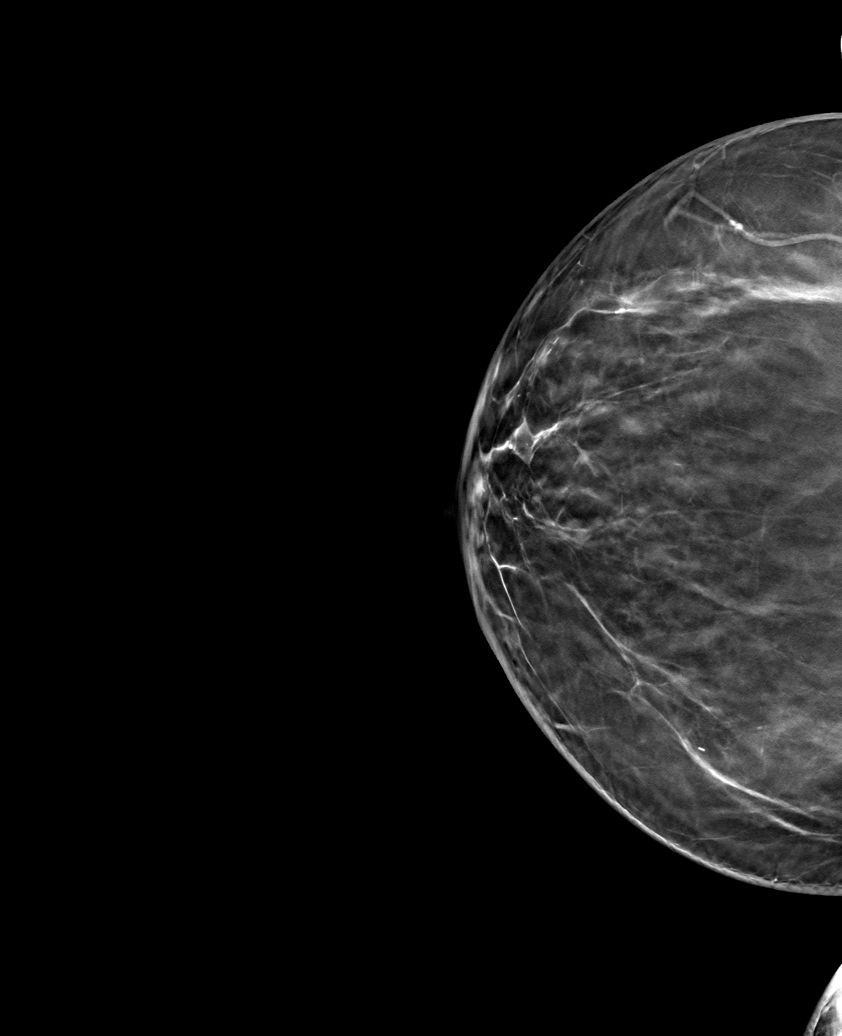

[6 of 30 positions shown; findings below may reference images not displayed]

ACR Breast Density Category b: There are scattered areas of
fibroglandular density.
FINDINGS: There are no findings suspicious for malignancy. Images were
processed with CAD.
IMPRESSION: No mammographic evidence of malignancy. A result letter of this
screening mammogram will be mailed directly to the patient.

RECOMMENDATION:
Screening mammogram in one year. (Code:[id number])

BI-RADS CATEGORY  1: Negative.

## 2021-05-18 DIAGNOSIS — Z794 Long term (current) use of insulin: Secondary | ICD-10-CM | POA: Diagnosis not present

## 2021-05-18 DIAGNOSIS — E1122 Type 2 diabetes mellitus with diabetic chronic kidney disease: Secondary | ICD-10-CM | POA: Diagnosis not present

## 2021-05-18 DIAGNOSIS — N184 Chronic kidney disease, stage 4 (severe): Secondary | ICD-10-CM | POA: Diagnosis not present

## 2021-05-18 DIAGNOSIS — I129 Hypertensive chronic kidney disease with stage 1 through stage 4 chronic kidney disease, or unspecified chronic kidney disease: Secondary | ICD-10-CM | POA: Diagnosis not present

## 2021-05-28 ENCOUNTER — Encounter (INDEPENDENT_AMBULATORY_CARE_PROVIDER_SITE_OTHER): Payer: BC Managed Care – PPO | Admitting: Ophthalmology

## 2021-06-04 ENCOUNTER — Other Ambulatory Visit: Payer: Self-pay | Admitting: Family Medicine

## 2021-06-04 ENCOUNTER — Ambulatory Visit
Admission: RE | Admit: 2021-06-04 | Discharge: 2021-06-04 | Disposition: A | Discharge status: Home / Self Care | Payer: Medicare PPO | Source: Ambulatory Visit | Attending: Family Medicine | Admitting: Family Medicine

## 2021-06-04 ENCOUNTER — Other Ambulatory Visit: Payer: Self-pay | Admitting: Cardiology

## 2021-06-04 DIAGNOSIS — R52 Pain, unspecified: Secondary | ICD-10-CM

## 2021-06-04 DIAGNOSIS — M25562 Pain in left knee: Secondary | ICD-10-CM | POA: Diagnosis not present

## 2021-06-04 DIAGNOSIS — I10 Essential (primary) hypertension: Secondary | ICD-10-CM

## 2021-06-05 DIAGNOSIS — I10 Essential (primary) hypertension: Secondary | ICD-10-CM | POA: Diagnosis not present

## 2021-06-05 DIAGNOSIS — N184 Chronic kidney disease, stage 4 (severe): Secondary | ICD-10-CM | POA: Diagnosis not present

## 2021-06-05 DIAGNOSIS — E1122 Type 2 diabetes mellitus with diabetic chronic kidney disease: Secondary | ICD-10-CM | POA: Diagnosis not present

## 2021-06-11 ENCOUNTER — Encounter (INDEPENDENT_AMBULATORY_CARE_PROVIDER_SITE_OTHER): Payer: Medicare PPO | Admitting: Ophthalmology

## 2021-06-11 DIAGNOSIS — E103393 Type 1 diabetes mellitus with moderate nonproliferative diabetic retinopathy without macular edema, bilateral: Secondary | ICD-10-CM | POA: Diagnosis not present

## 2021-06-11 DIAGNOSIS — H35033 Hypertensive retinopathy, bilateral: Secondary | ICD-10-CM | POA: Diagnosis not present

## 2021-06-11 DIAGNOSIS — H43813 Vitreous degeneration, bilateral: Secondary | ICD-10-CM

## 2021-06-11 DIAGNOSIS — I10 Essential (primary) hypertension: Secondary | ICD-10-CM

## 2021-06-18 ENCOUNTER — Emergency Department (HOSPITAL_COMMUNITY): Payer: Medicare PPO

## 2021-06-18 ENCOUNTER — Emergency Department (HOSPITAL_BASED_OUTPATIENT_CLINIC_OR_DEPARTMENT_OTHER): Payer: Medicare PPO

## 2021-06-18 ENCOUNTER — Emergency Department (HOSPITAL_COMMUNITY)
Admission: EM | Admit: 2021-06-18 | Discharge: 2021-06-18 | Disposition: A | Discharge status: Home / Self Care | Payer: Medicare PPO | Attending: Emergency Medicine | Admitting: Emergency Medicine

## 2021-06-18 ENCOUNTER — Encounter (HOSPITAL_COMMUNITY): Payer: Self-pay

## 2021-06-18 ENCOUNTER — Other Ambulatory Visit: Payer: Self-pay

## 2021-06-18 DIAGNOSIS — R410 Disorientation, unspecified: Secondary | ICD-10-CM | POA: Diagnosis not present

## 2021-06-18 DIAGNOSIS — I129 Hypertensive chronic kidney disease with stage 1 through stage 4 chronic kidney disease, or unspecified chronic kidney disease: Secondary | ICD-10-CM | POA: Diagnosis not present

## 2021-06-18 DIAGNOSIS — N189 Chronic kidney disease, unspecified: Secondary | ICD-10-CM | POA: Diagnosis not present

## 2021-06-18 DIAGNOSIS — F4489 Other dissociative and conversion disorders: Secondary | ICD-10-CM

## 2021-06-18 DIAGNOSIS — E875 Hyperkalemia: Secondary | ICD-10-CM | POA: Insufficient documentation

## 2021-06-18 DIAGNOSIS — F69 Unspecified disorder of adult personality and behavior: Secondary | ICD-10-CM | POA: Diagnosis not present

## 2021-06-18 DIAGNOSIS — R1084 Generalized abdominal pain: Secondary | ICD-10-CM | POA: Diagnosis not present

## 2021-06-18 DIAGNOSIS — D631 Anemia in chronic kidney disease: Secondary | ICD-10-CM | POA: Diagnosis not present

## 2021-06-18 DIAGNOSIS — I10 Essential (primary) hypertension: Secondary | ICD-10-CM | POA: Diagnosis not present

## 2021-06-18 DIAGNOSIS — Z7982 Long term (current) use of aspirin: Secondary | ICD-10-CM | POA: Insufficient documentation

## 2021-06-18 DIAGNOSIS — M79605 Pain in left leg: Secondary | ICD-10-CM | POA: Diagnosis not present

## 2021-06-18 DIAGNOSIS — Z79899 Other long term (current) drug therapy: Secondary | ICD-10-CM | POA: Insufficient documentation

## 2021-06-18 DIAGNOSIS — R531 Weakness: Secondary | ICD-10-CM | POA: Diagnosis not present

## 2021-06-18 DIAGNOSIS — I639 Cerebral infarction, unspecified: Secondary | ICD-10-CM | POA: Diagnosis not present

## 2021-06-18 DIAGNOSIS — C641 Malignant neoplasm of right kidney, except renal pelvis: Secondary | ICD-10-CM | POA: Diagnosis not present

## 2021-06-18 DIAGNOSIS — R4182 Altered mental status, unspecified: Secondary | ICD-10-CM | POA: Diagnosis not present

## 2021-06-18 DIAGNOSIS — Z794 Long term (current) use of insulin: Secondary | ICD-10-CM | POA: Diagnosis not present

## 2021-06-18 DIAGNOSIS — M79662 Pain in left lower leg: Secondary | ICD-10-CM | POA: Insufficient documentation

## 2021-06-18 DIAGNOSIS — N184 Chronic kidney disease, stage 4 (severe): Secondary | ICD-10-CM | POA: Diagnosis not present

## 2021-06-18 DIAGNOSIS — R000 Tachycardia, unspecified: Secondary | ICD-10-CM | POA: Diagnosis not present

## 2021-06-18 DIAGNOSIS — R42 Dizziness and giddiness: Secondary | ICD-10-CM | POA: Insufficient documentation

## 2021-06-18 DIAGNOSIS — I672 Cerebral atherosclerosis: Secondary | ICD-10-CM | POA: Diagnosis not present

## 2021-06-18 DIAGNOSIS — R079 Chest pain, unspecified: Secondary | ICD-10-CM | POA: Diagnosis not present

## 2021-06-18 DIAGNOSIS — C642 Malignant neoplasm of left kidney, except renal pelvis: Secondary | ICD-10-CM | POA: Diagnosis not present

## 2021-06-18 DIAGNOSIS — E1122 Type 2 diabetes mellitus with diabetic chronic kidney disease: Secondary | ICD-10-CM | POA: Insufficient documentation

## 2021-06-18 LAB — COMPREHENSIVE METABOLIC PANEL
ALT: 24 U/L (ref 0–44)
AST: 30 U/L (ref 15–41)
Albumin: 3.5 g/dL (ref 3.5–5.0)
Alkaline Phosphatase: 65 U/L (ref 38–126)
Anion gap: 6 (ref 5–15)
BUN: 19 mg/dL (ref 8–23)
CO2: 26 mmol/L (ref 22–32)
Calcium: 8.5 mg/dL — ABNORMAL LOW (ref 8.9–10.3)
Chloride: 111 mmol/L (ref 98–111)
Creatinine, Ser: 1.56 mg/dL — ABNORMAL HIGH (ref 0.44–1.00)
GFR, Estimated: 33 mL/min — ABNORMAL LOW (ref >60)
Glucose, Bld: 218 mg/dL — ABNORMAL HIGH (ref 70–99)
Potassium: 5.4 mmol/L — ABNORMAL HIGH (ref 3.5–5.1)
Sodium: 143 mmol/L (ref 135–145)
Total Bilirubin: 0.7 mg/dL (ref 0.3–1.2)
Total Protein: 6.5 g/dL (ref 6.5–8.1)

## 2021-06-18 LAB — DIFFERENTIAL
Abs Immature Granulocytes: 0.02 10*3/uL (ref 0.00–0.07)
Basophils Absolute: 0 10*3/uL (ref 0.0–0.1)
Basophils Relative: 1 %
Eosinophils Absolute: 0.3 10*3/uL (ref 0.0–0.5)
Eosinophils Relative: 5 %
Immature Granulocytes: 0 %
Lymphocytes Relative: 37 %
Lymphs Abs: 1.9 10*3/uL (ref 0.7–4.0)
Monocytes Absolute: 0.6 10*3/uL (ref 0.1–1.0)
Monocytes Relative: 11 %
Neutro Abs: 2.4 10*3/uL (ref 1.7–7.7)
Neutrophils Relative %: 46 %

## 2021-06-18 LAB — I-STAT CHEM 8, ED
BUN: 28 mg/dL — ABNORMAL HIGH (ref 8–23)
Calcium, Ion: 1.12 mmol/L — ABNORMAL LOW (ref 1.15–1.40)
Chloride: 109 mmol/L (ref 98–111)
Creatinine, Ser: 1.7 mg/dL — ABNORMAL HIGH (ref 0.44–1.00)
Glucose, Bld: 225 mg/dL — ABNORMAL HIGH (ref 70–99)
HCT: 38 % (ref 36.0–46.0)
Hemoglobin: 12.9 g/dL (ref 12.0–15.0)
Potassium: 5.5 mmol/L — ABNORMAL HIGH (ref 3.5–5.1)
Sodium: 141 mmol/L (ref 135–145)
TCO2: 27 mmol/L (ref 22–32)

## 2021-06-18 LAB — CBC
HCT: 39.6 % (ref 36.0–46.0)
Hemoglobin: 12.8 g/dL (ref 12.0–15.0)
MCH: 30.9 pg (ref 26.0–34.0)
MCHC: 32.3 g/dL (ref 30.0–36.0)
MCV: 95.7 fL (ref 80.0–100.0)
Platelets: 232 10*3/uL (ref 150–400)
RBC: 4.14 MIL/uL (ref 3.87–5.11)
RDW: 13.5 % (ref 11.5–15.5)
WBC: 5.2 10*3/uL (ref 4.0–10.5)
nRBC: 0 % (ref 0.0–0.2)

## 2021-06-18 LAB — CBG MONITORING, ED
Glucose-Capillary: 210 mg/dL — ABNORMAL HIGH (ref 70–99)
Glucose-Capillary: 59 mg/dL — ABNORMAL LOW (ref 70–99)

## 2021-06-18 LAB — PROTIME-INR
INR: 1 (ref 0.8–1.2)
Prothrombin Time: 12.8 seconds (ref 11.4–15.2)

## 2021-06-18 LAB — TROPONIN I (HIGH SENSITIVITY): Troponin I (High Sensitivity): 5 ng/L (ref <18)

## 2021-06-18 LAB — APTT: aPTT: 28 seconds (ref 24–36)

## 2021-06-18 MED ORDER — SODIUM CHLORIDE 0.9 % IV BOLUS
1000.0000 mL | Freq: Once | INTRAVENOUS | Status: AC
  Administered 2021-06-18: 1000 mL via INTRAVENOUS

## 2021-06-18 MED ORDER — SODIUM ZIRCONIUM CYCLOSILICATE 10 G PO PACK
10.0000 g | PACK | Freq: Once | ORAL | Status: AC
  Administered 2021-06-18: 10 g via ORAL
  Filled 2021-06-18: qty 1

## 2021-06-18 MED ORDER — SODIUM CHLORIDE 0.9% FLUSH: 3.0000 mL | Freq: Once | INTRAVENOUS | Status: DC

## 2021-06-18 NOTE — Discharge Instructions (Addendum)
Your potassium is mildly elevated today.  You need to follow-up with your primary care physician to get repeat blood work later this week. ? ?If you develop new or worsening symptoms such as dizziness, lightheadedness, severe headache, weakness or numbness, or any other new/concerning symptoms then return to the ER for evaluation. ?

## 2021-06-18 NOTE — ED Notes (Signed)
IV no longer infusing. Pulled-catheter appears bent. New IV palced- fluids started. ?

## 2021-06-18 NOTE — ED Notes (Signed)
Pt verbalized understanding of d/c instructions, meds, and followup care. Denies questions. VSS, no distress noted. Steady gait to exit with all belongings.  ?

## 2021-06-18 NOTE — ED Provider Notes (Signed)
?[facility] ?Provider Note ? ? ?CSN: [id number] ?Arrival date & time: 06/18/21  1229 ? ?  ? ?History ? ?Chief Complaint  ?Patient presents with  ? Code Stroke  ? ? ?[name] is a 81 y.o. female. ? ?HPI ?81 year old female with a history of diabetes, kidney disease, hypertension presents with acute confusion and as a code stroke.  History is initially from EMS who reports that she went from her house to the doctor starting at 10 AM.  At some point she became confused and she was pulled over by police for driving erratically.  She was disoriented.  She has a history of vertigo and indicates that was also playing a role today but the confusion still lingered and so 911 was called. She was found to have some LLE weakness on initial neurology exam. ? ?Home Medications ?Prior to Admission medications   ?Medication Sig Start Date End Date Taking? Authorizing Provider  ?acetaminophen (TYLENOL 8 HOUR) 650 MG CR tablet Take 1 tablet (650 mg total) by mouth every 8 (eight) hours as needed for pain. 02/18/20  Yes Aberman, Druscilla Brownie, PA-C  ?albuterol (PROVENTIL,VENTOLIN) 90 MCG/ACT inhaler Inhale 2 puffs into the lungs every 4 (four) hours as needed for wheezing or shortness of breath.    Yes [provider]  ?amLODipine (NORVASC) 5 MG tablet TAKE 1 TABLET BY MOUTH EVERY DAY ?Patient taking differently: Take 10 mg by mouth daily. 06/28/19  Yes Patwardhan, Manish J, MD  ?aspirin EC 81 MG tablet Take 81 mg by mouth at bedtime.   Yes [provider]  ?atorvastatin (LIPITOR) 10 MG tablet Take 10 mg by mouth every morning.   Yes [provider]  ?cholecalciferol (VITAMIN D) 1000 units tablet Take 1,000 Units by mouth daily.   Yes [provider]  ?donepezil (ARICEPT) 10 MG tablet Take 1 tablet (10 mg total) by mouth at bedtime. Must be seen for further refills. Call [phone]. 12/27/20  Yes Ward Givens, NP  ?fluticasone (FLONASE) 50 MCG/ACT nasal  spray Place 1 spray into both nostrils daily. ?Patient taking differently: Place 1 spray into both nostrils daily as needed for allergies. 08/27/20  Yes Volanda Napoleon, PA-C  ?gabapentin (NEURONTIN) 300 MG capsule Take 1 capsule (300 mg total) by mouth at bedtime. ?Patient taking differently: Take 300 mg by mouth 2 (two) times daily. 07/16/16  Yes RegalTamala Fothergill, DPM  ?HYDROcodone-acetaminophen (NORCO/VICODIN) 5-325 MG tablet Take 0.5 tablets by mouth every 6 (six) hours as needed for moderate pain.   Yes [provider]  ?insulin degludec (TRESIBA) 200 UNIT/ML FlexTouch Pen Inject 80 Units into the skin daily before breakfast.   Yes [provider]  ?metoprolol succinate (TOPROL-XL) 100 MG 24 hr tablet TAKE 1 TABLET (100 MG TOTAL) BY MOUTH DAILY ?Patient taking differently: 100 mg every evening. 10/25/20  Yes [name], MD  ?Multiple Vitamins-Minerals (CENTRUM SILVER ADULT 50+ PO) Take 1 tablet by mouth daily.   Yes [provider]  ?MYRBETRIQ 25 MG TB24 tablet Take 25 mg by mouth daily. 06/18/21  Yes [provider]  ?nitroGLYCERIN (NITROSTAT) 0.4 MG SL tablet Place 0.4 mg under the tongue every 5 (five) minutes as needed for chest pain.   Yes [provider]  ?pantoprazole (PROTONIX) 40 MG tablet Take 40 mg by mouth daily. 03/25/19  Yes [provider]  ?TRADJENTA 5 MG TABS tablet Take 5 mg by mouth at bedtime. 06/24/19  Yes [provider]  ?   ? ?  Allergies    ?Sulfa antibiotics   ? ?Review of Systems   ?Review of Systems ? ?Physical Exam ?Updated Vital Signs ?BP (!) 159/126   Pulse (!) 55   Temp 98.2 ?F (36.8 ?C) (Oral)   Resp 17   Ht '4\' 11"'$  (1.499 m)   Wt 68 kg   SpO2 98%   BMI 30.28 kg/m?  ?Physical Exam ?Vitals and nursing note reviewed.  ?Constitutional:   ?   Appearance: She is well-developed.  ?HENT:  ?   Head: Normocephalic and atraumatic.  ?Cardiovascular:  ?   Rate and Rhythm: Normal rate and regular rhythm.  ?   Heart sounds: Normal  heart sounds.  ?Pulmonary:  ?   Effort: Pulmonary effort is normal.  ?   Breath sounds: Normal breath sounds.  ?Abdominal:  ?   Palpations: Abdomen is soft.  ?   Tenderness: There is no abdominal tenderness.  ?Skin: ?   General: Skin is warm and dry.  ?Neurological:  ?   Mental Status: She is alert.  ?   Comments: Equal strength in both upper extremities.  Right lower extremity has normal strength.  Left lower extremity is very difficult for her to keep off of the stretcher against gravity or lift up on her own.  However she also states there is pain in her left calf while doing this.  She can bend her knee and hip okay.  No numbness.  When we try to sit up to stand she acutely felt dizzy but then this got a little better and she was able to take a few steps with significant assistance  ? ? ?ED Results / Procedures / Treatments   ?Labs ?(all labs ordered are listed, but only abnormal results are displayed) ?Labs Reviewed  ?COMPREHENSIVE METABOLIC PANEL - Abnormal; Notable for the following components:  ?    Result Value  ? Potassium 5.4 (*)   ? Glucose, Bld 218 (*)   ? Creatinine, Ser 1.56 (*)   ? Calcium 8.5 (*)   ? GFR, Estimated 33 (*)   ? All other components within normal limits  ?I-STAT CHEM 8, ED - Abnormal; Notable for the following components:  ? Potassium 5.5 (*)   ? BUN 28 (*)   ? Creatinine, Ser 1.70 (*)   ? Glucose, Bld 225 (*)   ? Calcium, Ion 1.12 (*)   ? All other components within normal limits  ?CBG MONITORING, ED - Abnormal; Notable for the following components:  ? Glucose-Capillary 210 (*)   ? All other components within normal limits  ?PROTIME-INR  ?APTT  ?CBC  ?DIFFERENTIAL  ?TROPONIN I (HIGH SENSITIVITY)  ? ? ?EKG ?EKG Interpretation ? ?Date/Time:  Monday Jun 18 2021 14:31:29 EDT ?Ventricular Rate:  62 ?PR Interval:  183 ?QRS Duration: 78 ?QT Interval:  421 ?QTC Calculation: 428 ?R Axis:   62 ?Text Interpretation: Sinus arrhythmia no acute ST/T changes Confirmed by Sherwood Gambler [phone]) on  06/18/2021 4:40:43 PM ? ?Radiology ?MR ANGIO HEAD WO CONTRAST ? ?Result Date: 06/18/2021 ?CLINICAL DATA:  Altered mental status, stroke suspected EXAM: MRI HEAD WITHOUT CONTRAST MRA HEAD WITHOUT CONTRAST MRA NECK WITHOUT CONTRAST TECHNIQUE: Multiplanar, multiecho pulse sequences of the brain and surrounding structures were obtained without intravenous contrast. Angiographic images of the Circle of Willis were obtained using MRA technique without intravenous contrast. Angiographic images of the neck were obtained using MRA technique without intravenous contrast. Carotid stenosis measurements (when applicable) are obtained utilizing NASCET criteria, using the distal internal  carotid diameter as the denominator. COMPARISON:  Same-day noncontrast head CT, MR head 07/22/2017 FINDINGS: MRI HEAD FINDINGS Brain: There is no acute intracranial hemorrhage, extra-axial fluid collection, or acute infarct. There is mild global parenchymal volume loss with prominence of the ventricular system and extra-axial CSF spaces. Patchy FLAIR signal abnormality in the subcortical and periventricular white matter likely reflects sequela of mild chronic white matter microangiopathy. There is no suspicious parenchymal signal abnormality. There is no mass lesion. There is no mass effect or midline shift. Vascular: See below. Skull and upper cervical spine: Normal marrow signal. Sinuses/Orbits: The paranasal sinuses are clear. Bilateral lens implants are in place. The globes and orbits are otherwise unremarkable. Other: None. MRA HEAD FINDINGS Anterior circulation: The intracranial ICAs are patent The bilateral MCAs are patent. The bilateral ACAs are patent. The anterior communicating artery is normal. There is no aneurysm or AVM. Posterior circulation: Bilateral V4 segments are patent. The basilar artery is patent. The bilateral PCAs are patent. A prominent right posterior communicating artery is identified. The left posterior communicating artery  is not definitely seen. There is no aneurysm or AVM. Anatomic variants: As above. MRA NECK FINDINGS Aortic arch: The imaged aortic arch is normal. The origins of the major branch vessels appear patent. Right caroti

## 2021-06-18 NOTE — Consult Note (Addendum)
Neurology Consultation ? ?Reason for Consult: Code stroke  ?Referring Physician: Dr. Regenia Skeeter  ? ?CC: AMS ? ?History is obtained from:patient, medical record and EMS  ? ?HPI: [name] is a 81 y.o. female with past medical history of HTN, HLD, vertigo, CKD,kidney cancer with status post left nephrectomy, asthma, DM, GERD, sleep apnea who presents today for AMS. She was attempting to go to her kidney MD appt, when she was driving erratically and was found to be confused when she was pulled over by the police. The police brought her to her appointment. From there EMS was called for confusion and vertigo. Code stroke was called by EMS. No focal deficits. LKW ~ 1000am. She does not remember exact time however her appointment was @ 1045a. CT head negative for acute process  ? ? ?LKW: ~10am  ?tpa given?: no, low NIHSS  ?Premorbid modified Rankin scale (mRS):  ?1-No significant post stroke disability and can perform usual duties with stroke symptoms ? ?ROS: Full ROS was performed and is negative except as noted in the HPI. ?Past Medical History:  ?Diagnosis Date  ? ACS (acute coronary syndrome) ([facility]) 12/27/2010  ? AKI (acute kidney injury) ([facility]) 03/25/2018  ? Anginal pain ([facility])   ? Arthritis   ? Asthma   ? Diabetes mellitus   ? Type 1  ? Dysrhythmia   ? "irregular heart beat"  ? GERD (gastroesophageal reflux disease)   ? History of urinary tract infection   ? Hypercholesteremia   ? Hypertension   ? left renal ca dx'd 2012 (?)  ? surg only, left kidney  ? Nocturia   ? Reflux   ? Renal failure (ARF), acute on chronic (HCC)   ? Shortness of breath dyspnea   ? pt denies; states can climb stairs w/o difficulty   ? Sleep apnea   ? does not use c-pap machine  ? Tingling in extremities   ? legs bilat  ? Tinnitus   ? Urinary frequency   ? Urinary incontinence   ? Vertigo   ? occurs when lying flat   ? ?Essential (primary) hypertension, Type 2 diabetes mellitus with hyperglycemia , and CKD Stage 4 (GFR 15-29)  ? ?Family History   ?Problem Relation Age of Onset  ? Hypertension Mother   ? Coronary artery disease Other   ? Breast cancer Cousin   ? ? ? ?Social History:  ? reports that she has never smoked. She has never used smokeless tobacco. She reports that she does not drink alcohol and does not use drugs. ? ?Medications ? ?Current Facility-Administered Medications:  ?  sodium chloride flush (NS) 0.9 % injection 3 mL, 3 mL, Intravenous, Once, Sherwood Gambler, MD ? ?Current Outpatient Medications:  ?  acetaminophen (TYLENOL 8 HOUR) 650 MG CR tablet, Take 1 tablet (650 mg total) by mouth every 8 (eight) hours as needed for pain., Disp: 15 tablet, Rfl: 0 ?  albuterol (PROVENTIL,VENTOLIN) 90 MCG/ACT inhaler, Inhale 2 puffs into the lungs every 4 (four) hours as needed for wheezing or shortness of breath. , Disp: , Rfl:  ?  amLODipine (NORVASC) 5 MG tablet, TAKE 1 TABLET BY MOUTH EVERY DAY, Disp: 90 tablet, Rfl: 1 ?  aspirin EC 81 MG tablet, Take 81 mg by mouth at bedtime., Disp: , Rfl:  ?  atorvastatin (LIPITOR) 10 MG tablet, Take 10 mg by mouth every morning., Disp: , Rfl:  ?  cholecalciferol (VITAMIN D) 1000 units tablet, Take 1,000 Units by mouth daily., Disp: , Rfl:  ?  donepezil (ARICEPT) 10 MG tablet, Take 1 tablet (10 mg total) by mouth at bedtime. Must be seen for further refills. Call [phone]., Disp: 90 tablet, Rfl: 0 ?  fluticasone (FLONASE) 50 MCG/ACT nasal spray, Place 1 spray into both nostrils daily. (Patient taking differently: Place 1 spray into both nostrils daily as needed for allergies.), Disp: 16 g, Rfl: 2 ?  gabapentin (NEURONTIN) 300 MG capsule, Take 1 capsule (300 mg total) by mouth at bedtime. (Patient taking differently: Take 300 mg by mouth 2 (two) times daily.), Disp: 90 capsule, Rfl: 0 ?  insulin degludec (TRESIBA) 200 UNIT/ML FlexTouch Pen, Inject 80 Units into the skin daily before breakfast., Disp: , Rfl:  ?  lidocaine (LIDODERM) 5 %, Place 1 patch onto the skin daily. Remove & Discard patch within 12 hours  or as directed by MD (Patient not taking: Reported on 06/01/2020), Disp: 30 patch, Rfl: 0 ?  methocarbamol (ROBAXIN) 500 MG tablet, Take 1 tablet (500 mg total) by mouth 2 (two) times daily. (Patient not taking: Reported on 06/01/2020), Disp: 20 tablet, Rfl: 0 ?  methylPREDNISolone (MEDROL DOSEPAK) 4 MG TBPK tablet, Take as instructed (Patient not taking: Reported on 06/01/2020), Disp: 1 each, Rfl: 0 ?  metoprolol succinate (TOPROL-XL) 100 MG 24 hr tablet, TAKE 1 TABLET (100 MG TOTAL) BY MOUTH DAILY (Patient taking differently: 100 mg every evening.), Disp: 90 tablet, Rfl: 1 ?  Multiple Vitamins-Minerals (CENTRUM SILVER ADULT 50+ PO), Take 1 tablet by mouth daily., Disp: , Rfl:  ?  nitroGLYCERIN (NITROSTAT) 0.4 MG SL tablet, Place 0.4 mg under the tongue every 5 (five) minutes as needed for chest pain., Disp: , Rfl:  ?  ondansetron (ZOFRAN ODT) 4 MG disintegrating tablet, Take 1 tablet (4 mg total) by mouth every 8 (eight) hours as needed for nausea or vomiting. (Patient not taking: Reported on 03/05/2021), Disp: 6 tablet, Rfl: 0 ?  oxyCODONE (ROXICODONE) 5 MG immediate release tablet, Take 1 tablet (5 mg total) by mouth every 6 (six) hours as needed for severe pain. (Patient not taking: Reported on 06/01/2020), Disp: 10 tablet, Rfl: 0 ?  oxyCODONE-acetaminophen (PERCOCET) 5-325 MG tablet, Take 1 tablet by mouth every 4 (four) hours as needed for moderate pain or severe pain. (Patient not taking: Reported on 03/05/2021), Disp: 20 tablet, Rfl: 0 ?  pantoprazole (PROTONIX) 40 MG tablet, Take 40 mg by mouth daily as needed (heartburn). , Disp: , Rfl:  ?  TRADJENTA 5 MG TABS tablet, Take 5 mg by mouth daily., Disp: , Rfl:  ? ? ?Exam: ?Current vital signs: ?Ht '4\' 11"'$  (1.499 m)   Wt 68 kg   BMI 30.28 kg/m?  ?Vital signs in last 24 hours: ?Weight:  [68 kg] 68 kg (05/01 1238) ? ?GENERAL: tearful, Awake, alert in NAD ?HEENT: - Normocephalic and atraumatic, dry mm ?LUNGS - Clear to auscultation bilaterally with no wheezes ?CV -  S1S2 RRR, no m/r/g, equal pulses bilaterally. ?ABDOMEN - Soft, nontender, nondistended with normoactive BS ?Ext: warm, well perfused, intact peripheral pulses, no edema ? ?NEURO:  ?Mental Status: AA&Ox4 ?Language: speech is clear.  Naming, repetition, fluency, and comprehension intact. ?Cranial Nerves: PERRL 8m/brisk. EOMI, visual fields full, no facial asymmetry, facial sensation intact, hearing intact, tongue/uvula/soft palate midline, normal sternocleidomastoid and trapezius muscle strength. No evidence of tongue atrophy or fibrillations ?Motor: left arm 5/5, left leg 4/5, right arm 5/5, right leg 5/5  ?Tone: is normal and bulk is normal ?Sensation- Intact to light touch bilaterally ?Coordination: FTN intact bilaterally, no  ataxia in BLE. ?Gait- deferred ? ?NIHSS ?1a Level of Conscious.: 0 ?1b LOC Questions: 0 ?1c LOC Commands: 0 ?2 Best Gaze: 0 ?3 Visual: 0 ?4 Facial Palsy: 0 ?5a Motor Arm - left: 0 ?5b Motor Arm - Right: 0 ?6a Motor Leg - Left: 1 ?6b Motor Leg - Right: 0 ?7 Limb Ataxia: 0 ?8 Sensory: 0 ?9 Best Language: 0 ?10 Dysarthria: 0 ?11 Extinct. and Inatten.: 0 ?TOTAL: 0  ? ? ?Imaging ?I have reviewed the images obtained: ? ?Code stroke CT-head ?1. No acute brain parenchymal finding. Question hyperdense right M2 branch, not definite. ?2. ASPECTS is 10. ? ?Assessment:  ?[name] is a 81 y.o. female with past medical history of HTN, HLD, vertigo, CKD, asthma, DM, GERD, sleep apnea who presents today for AMS. She was attempting to go to her kidney MD appt, when she was driving erratically and was found to be confused when she was pulled over by the police. Code stroke called by EMS  ? ?Recommendations: ?- MRI of the brain without contrast ? ?Beulah Gandy DNP, ACNPC-AG  ? ?I was present for directing the entirety of the evaluation and management reflected in the above note.  She gives poor effort in her left leg, but this is inconsistent, at times flopping flaccidly while at other times if you lift  her knee she holds her ankle off the bed.  I suspect there may be some degree of embellishment, in addition to some degree of pain contributing to her exam given her complaint of thigh pain on the left. ? ?Sh

## 2021-06-18 NOTE — ED Triage Notes (Signed)
Brought in by EMS after police stopped her for driving erratically.  Patient takes namenda.  Patient complains of being confused. LKW unknown due to patient living alone.  ?

## 2021-06-18 NOTE — Progress Notes (Signed)
LLE venous duplex has been completed.  Preliminary results messaged to Dr. Regenia Skeeter via secure chat. ? ? ?Results can be found under chart review under CV PROC. ?06/18/2021 3:36 PM ?Jasia Hiltunen RVT, RDMS ? ?

## 2021-06-18 NOTE — Code Documentation (Signed)
Cancel code stroke per Neurologist. [facility] called and page sent out. Oneisha Ammons, Rande Brunt, RN   ?

## 2021-07-14 ENCOUNTER — Other Ambulatory Visit: Payer: Self-pay | Admitting: Cardiology

## 2021-07-14 DIAGNOSIS — I10 Essential (primary) hypertension: Secondary | ICD-10-CM

## 2021-07-18 DIAGNOSIS — I129 Hypertensive chronic kidney disease with stage 1 through stage 4 chronic kidney disease, or unspecified chronic kidney disease: Secondary | ICD-10-CM | POA: Diagnosis not present

## 2021-07-18 DIAGNOSIS — E1122 Type 2 diabetes mellitus with diabetic chronic kidney disease: Secondary | ICD-10-CM | POA: Diagnosis not present

## 2021-07-24 DIAGNOSIS — E1122 Type 2 diabetes mellitus with diabetic chronic kidney disease: Secondary | ICD-10-CM | POA: Diagnosis not present

## 2021-07-24 DIAGNOSIS — M545 Low back pain, unspecified: Secondary | ICD-10-CM | POA: Diagnosis not present

## 2021-07-24 DIAGNOSIS — M25512 Pain in left shoulder: Secondary | ICD-10-CM | POA: Diagnosis not present

## 2021-07-24 DIAGNOSIS — N184 Chronic kidney disease, stage 4 (severe): Secondary | ICD-10-CM | POA: Diagnosis not present

## 2021-07-24 DIAGNOSIS — N181 Chronic kidney disease, stage 1: Secondary | ICD-10-CM | POA: Diagnosis not present

## 2021-07-24 DIAGNOSIS — I10 Essential (primary) hypertension: Secondary | ICD-10-CM | POA: Diagnosis not present

## 2021-07-24 DIAGNOSIS — R600 Localized edema: Secondary | ICD-10-CM | POA: Diagnosis not present

## 2021-07-31 DIAGNOSIS — I10 Essential (primary) hypertension: Secondary | ICD-10-CM | POA: Diagnosis not present

## 2021-07-31 DIAGNOSIS — R600 Localized edema: Secondary | ICD-10-CM | POA: Diagnosis not present

## 2021-07-31 DIAGNOSIS — E1122 Type 2 diabetes mellitus with diabetic chronic kidney disease: Secondary | ICD-10-CM | POA: Diagnosis not present

## 2021-07-31 DIAGNOSIS — N184 Chronic kidney disease, stage 4 (severe): Secondary | ICD-10-CM | POA: Diagnosis not present

## 2021-08-14 DIAGNOSIS — N184 Chronic kidney disease, stage 4 (severe): Secondary | ICD-10-CM | POA: Diagnosis not present

## 2021-08-14 DIAGNOSIS — E1122 Type 2 diabetes mellitus with diabetic chronic kidney disease: Secondary | ICD-10-CM | POA: Diagnosis not present

## 2021-08-14 DIAGNOSIS — M184 Other bilateral secondary osteoarthritis of first carpometacarpal joints: Secondary | ICD-10-CM | POA: Diagnosis not present

## 2021-08-14 DIAGNOSIS — Z6828 Body mass index (BMI) 28.0-28.9, adult: Secondary | ICD-10-CM | POA: Diagnosis not present

## 2021-08-23 DIAGNOSIS — E1169 Type 2 diabetes mellitus with other specified complication: Secondary | ICD-10-CM | POA: Diagnosis not present

## 2021-08-28 ENCOUNTER — Other Ambulatory Visit: Payer: Self-pay

## 2021-08-28 ENCOUNTER — Encounter (HOSPITAL_COMMUNITY): Payer: Self-pay

## 2021-08-28 ENCOUNTER — Emergency Department (HOSPITAL_COMMUNITY)
Admission: EM | Admit: 2021-08-28 | Discharge: 2021-08-29 | Disposition: A | Discharge status: Home / Self Care | Payer: Medicare PPO | Attending: Emergency Medicine | Admitting: Emergency Medicine

## 2021-08-28 ENCOUNTER — Emergency Department (HOSPITAL_COMMUNITY): Payer: Medicare PPO

## 2021-08-28 DIAGNOSIS — R42 Dizziness and giddiness: Secondary | ICD-10-CM | POA: Insufficient documentation

## 2021-08-28 DIAGNOSIS — Z79899 Other long term (current) drug therapy: Secondary | ICD-10-CM | POA: Insufficient documentation

## 2021-08-28 DIAGNOSIS — I700 Atherosclerosis of aorta: Secondary | ICD-10-CM | POA: Diagnosis not present

## 2021-08-28 DIAGNOSIS — Z794 Long term (current) use of insulin: Secondary | ICD-10-CM | POA: Diagnosis not present

## 2021-08-28 DIAGNOSIS — R109 Unspecified abdominal pain: Secondary | ICD-10-CM | POA: Diagnosis not present

## 2021-08-28 DIAGNOSIS — E1169 Type 2 diabetes mellitus with other specified complication: Secondary | ICD-10-CM | POA: Diagnosis not present

## 2021-08-28 DIAGNOSIS — R1013 Epigastric pain: Secondary | ICD-10-CM | POA: Diagnosis not present

## 2021-08-28 DIAGNOSIS — Z7951 Long term (current) use of inhaled steroids: Secondary | ICD-10-CM | POA: Diagnosis not present

## 2021-08-28 DIAGNOSIS — J45909 Unspecified asthma, uncomplicated: Secondary | ICD-10-CM | POA: Insufficient documentation

## 2021-08-28 DIAGNOSIS — R600 Localized edema: Secondary | ICD-10-CM | POA: Diagnosis not present

## 2021-08-28 DIAGNOSIS — R0602 Shortness of breath: Secondary | ICD-10-CM | POA: Insufficient documentation

## 2021-08-28 DIAGNOSIS — R079 Chest pain, unspecified: Secondary | ICD-10-CM | POA: Diagnosis not present

## 2021-08-28 DIAGNOSIS — I10 Essential (primary) hypertension: Secondary | ICD-10-CM | POA: Insufficient documentation

## 2021-08-28 DIAGNOSIS — Z7982 Long term (current) use of aspirin: Secondary | ICD-10-CM | POA: Insufficient documentation

## 2021-08-28 DIAGNOSIS — R110 Nausea: Secondary | ICD-10-CM | POA: Diagnosis not present

## 2021-08-28 DIAGNOSIS — K580 Irritable bowel syndrome with diarrhea: Secondary | ICD-10-CM | POA: Diagnosis not present

## 2021-08-28 DIAGNOSIS — R0789 Other chest pain: Secondary | ICD-10-CM | POA: Insufficient documentation

## 2021-08-28 DIAGNOSIS — Z6829 Body mass index (BMI) 29.0-29.9, adult: Secondary | ICD-10-CM | POA: Diagnosis not present

## 2021-08-28 DIAGNOSIS — E109 Type 1 diabetes mellitus without complications: Secondary | ICD-10-CM | POA: Diagnosis not present

## 2021-08-28 DIAGNOSIS — N184 Chronic kidney disease, stage 4 (severe): Secondary | ICD-10-CM | POA: Diagnosis not present

## 2021-08-28 DIAGNOSIS — E1165 Type 2 diabetes mellitus with hyperglycemia: Secondary | ICD-10-CM | POA: Diagnosis not present

## 2021-08-28 DIAGNOSIS — R531 Weakness: Secondary | ICD-10-CM | POA: Diagnosis not present

## 2021-08-28 DIAGNOSIS — R5381 Other malaise: Secondary | ICD-10-CM | POA: Diagnosis not present

## 2021-08-28 LAB — BASIC METABOLIC PANEL
Anion gap: 6 (ref 5–15)
BUN: 25 mg/dL — ABNORMAL HIGH (ref 8–23)
CO2: 28 mmol/L (ref 22–32)
Calcium: 9.8 mg/dL (ref 8.9–10.3)
Chloride: 108 mmol/L (ref 98–111)
Creatinine, Ser: 1.73 mg/dL — ABNORMAL HIGH (ref 0.44–1.00)
GFR, Estimated: 29 mL/min — ABNORMAL LOW (ref >60)
Glucose, Bld: 300 mg/dL — ABNORMAL HIGH (ref 70–99)
Potassium: 5 mmol/L (ref 3.5–5.1)
Sodium: 142 mmol/L (ref 135–145)

## 2021-08-28 LAB — CBC
HCT: 39.2 % (ref 36.0–46.0)
Hemoglobin: 12.6 g/dL (ref 12.0–15.0)
MCH: 30.2 pg (ref 26.0–34.0)
MCHC: 32.1 g/dL (ref 30.0–36.0)
MCV: 94 fL (ref 80.0–100.0)
Platelets: 246 10*3/uL (ref 150–400)
RBC: 4.17 MIL/uL (ref 3.87–5.11)
RDW: 13.6 % (ref 11.5–15.5)
WBC: 5 10*3/uL (ref 4.0–10.5)
nRBC: 0 % (ref 0.0–0.2)

## 2021-08-28 LAB — URINALYSIS, ROUTINE W REFLEX MICROSCOPIC
Bacteria, UA: NONE SEEN
Bilirubin Urine: NEGATIVE
Glucose, UA: >=500 mg/dL — AB
Hgb urine dipstick: NEGATIVE
Ketones, ur: NEGATIVE mg/dL
Nitrite: NEGATIVE
Protein, ur: NEGATIVE mg/dL
Specific Gravity, Urine: 1.01 (ref 1.005–1.030)
pH: 6 (ref 5.0–8.0)

## 2021-08-28 LAB — TROPONIN I (HIGH SENSITIVITY)
Troponin I (High Sensitivity): 3 ng/L (ref <18)
Troponin I (High Sensitivity): 4 ng/L (ref <18)

## 2021-08-28 MED ORDER — ONDANSETRON HCL 4 MG/2ML IJ SOLN
4.0000 mg | Freq: Once | INTRAMUSCULAR | Status: AC
  Administered 2021-08-28: 4 mg via INTRAVENOUS
  Filled 2021-08-28: qty 2

## 2021-08-28 NOTE — ED Triage Notes (Signed)
Patient has had chest pain since Sunday. Pain is in her left breast, no radiation. Said she is sick to her stomach, has not vomited yet. Said her head is swimming, not feeling steady. No heart attack or family history of one. Has type 1 diabetes, it is 274. She said it is her fault it is high because she has been eating sugary things. But she does take her insulin.

## 2021-08-28 NOTE — ED Provider Notes (Signed)
[facility] DEPT Provider Note   CSN: [id number] Arrival date & time: 08/28/21  1918     History {Add pertinent medical, surgical, social history, OB history to HPI:1} Chief Complaint  Patient presents with   Chest Pain    [name] is a 81 y.o. female.  HPI     This is an 81 year old female who presents with multiple complaints.  Patient reports that she just has not felt well since Sunday.  She describes feeling nauseated and "just sick."  She describes shortness of breath and chest discomfort.  She has not had any vomiting.  She has had some abdominal discomfort.  Normal bowel movements.  No fevers.  Patient states that she has had some chest discomfort intermittently over the last several weeks.  She has some dizziness as well.  She has a history of diabetes and reports that she has been noncompliant with the diet.  Home Medications Prior to Admission medications   Medication Sig Start Date End Date Taking? Authorizing Provider  acetaminophen (TYLENOL 8 HOUR) 650 MG CR tablet Take 1 tablet (650 mg total) by mouth every 8 (eight) hours as needed for pain. 02/18/20   Suzy Bouchard, PA-C  albuterol (PROVENTIL,VENTOLIN) 90 MCG/ACT inhaler Inhale 2 puffs into the lungs every 4 (four) hours as needed for wheezing or shortness of breath.     [provider]  amLODipine (NORVASC) 5 MG tablet TAKE 1 TABLET BY MOUTH EVERY DAY Patient taking differently: Take 10 mg by mouth daily. 06/28/19   Patwardhan, Reynold Bowen, MD  aspirin EC 81 MG tablet Take 81 mg by mouth at bedtime.    [provider]  atorvastatin (LIPITOR) 10 MG tablet Take 10 mg by mouth every morning.    [provider]  cholecalciferol (VITAMIN D) 1000 units tablet Take 1,000 Units by mouth daily.    [provider]  donepezil (ARICEPT) 10 MG tablet Take 1 tablet (10 mg total) by mouth at bedtime. Must be seen for further refills. Call [phone].  12/27/20   Ward Givens, NP  fluticasone (FLONASE) 50 MCG/ACT nasal spray Place 1 spray into both nostrils daily. Patient taking differently: Place 1 spray into both nostrils daily as needed for allergies. 08/27/20   Volanda Napoleon, PA-C  gabapentin (NEURONTIN) 300 MG capsule Take 1 capsule (300 mg total) by mouth at bedtime. Patient taking differently: Take 300 mg by mouth 2 (two) times daily. 07/16/16   Wallene Huh, DPM  HYDROcodone-acetaminophen (NORCO/VICODIN) 5-325 MG tablet Take 0.5 tablets by mouth every 6 (six) hours as needed for moderate pain.    [provider]  insulin degludec (TRESIBA) 200 UNIT/ML FlexTouch Pen Inject 80 Units into the skin daily before breakfast.    [provider]  metoprolol succinate (TOPROL-XL) 100 MG 24 hr tablet TAKE 1 TABLET BY MOUTH EVERY DAY 07/17/21   [name], MD  Multiple Vitamins-Minerals (CENTRUM SILVER ADULT 50+ PO) Take 1 tablet by mouth daily.    [provider]  MYRBETRIQ 25 MG TB24 tablet Take 25 mg by mouth daily. 06/18/21   [provider]  nitroGLYCERIN (NITROSTAT) 0.4 MG SL tablet Place 0.4 mg under the tongue every 5 (five) minutes as needed for chest pain.    [provider]  pantoprazole (PROTONIX) 40 MG tablet Take 40 mg by mouth daily. 03/25/19   [provider]  TRADJENTA 5 MG TABS tablet Take 5 mg by mouth at bedtime. 06/24/19   [provider]      Allergies    Sulfa antibiotics    Review of Systems   Review of Systems  Constitutional:  Negative for fever.  Respiratory:  Positive for shortness of breath.   Cardiovascular:  Positive for chest pain.  Gastrointestinal:  Positive for abdominal pain and nausea.  All other systems reviewed and are negative.   Physical Exam Updated Vital Signs BP (!) 174/82   Pulse 61   Temp 98.2 F (36.8 C) (Oral)   Resp 13   Ht 1.499 m ('4\' 11"'$ )   Wt 67.1 kg   SpO2 100%   BMI 29.89 kg/m  Physical Exam Vitals and nursing  note reviewed.  Constitutional:      Appearance: She is well-developed. She is obese. She is not ill-appearing.  HENT:     Head: Normocephalic and atraumatic.  Eyes:     Pupils: Pupils are equal, round, and reactive to light.  Cardiovascular:     Rate and Rhythm: Normal rate and regular rhythm.     Heart sounds: Normal heart sounds.  Pulmonary:     Effort: Pulmonary effort is normal. No respiratory distress.     Breath sounds: No wheezing.  Abdominal:     General: Bowel sounds are normal.     Palpations: Abdomen is soft.     Tenderness: There is abdominal tenderness.     Comments: Epigastric bilateral upper quadrant tenderness to palpation without rebound or guarding  Musculoskeletal:     Cervical back: Neck supple.     Comments: Trace bilateral lower extremity edema  Skin:    General: Skin is warm and dry.  Neurological:     Mental Status: She is alert and oriented to person, place, and time.  Psychiatric:        Mood and Affect: Mood normal.     ED Results / Procedures / Treatments   Labs (all labs ordered are listed, but only abnormal results are displayed) Labs Reviewed  BASIC METABOLIC PANEL - Abnormal; Notable for the following components:      Result Value   Glucose, Bld 300 (*)    BUN 25 (*)    Creatinine, Ser 1.73 (*)    GFR, Estimated 29 (*)    All other components within normal limits  CBC  HEPATIC FUNCTION PANEL  LIPASE, BLOOD  URINALYSIS, ROUTINE W REFLEX MICROSCOPIC  TROPONIN I (HIGH SENSITIVITY)  TROPONIN I (HIGH SENSITIVITY)    EKG None  Radiology DG Chest 2 View  Result Date: 08/28/2021 CLINICAL DATA:  Chest pain weakness. EXAM: CHEST - 2 VIEW COMPARISON:  Jun 18, 2021. FINDINGS: EKG leads project over the chest. Cardiomediastinal contours and hilar structures are stable lungs are clear. No sign of effusion. On limited assessment there is no acute skeletal process. IMPRESSION: No active cardiopulmonary disease. Electronically Signed   By:  Zetta Bills M.D.   On: 08/28/2021 20:42    Procedures Procedures  {Document cardiac monitor, telemetry assessment procedure when appropriate:1}  Medications Ordered in ED Medications  ondansetron (ZOFRAN) injection 4 mg (4 mg Intravenous Given 08/28/21 2337)    ED Course/ Medical Decision Making/ A&P                           Medical Decision Making Amount and/or Complexity of Data Reviewed Labs: ordered. Radiology: ordered.  Risk Prescription drug management.   ***  {Document critical care time when appropriate:1} {Document review of labs and clinical  decision tools ie heart score, Chads2Vasc2 etc:1}  {Document your independent review of radiology images, and any outside records:1} {Document your discussion with family members, caretakers, and with consultants:1} {Document social determinants of health affecting pt's care:1} {Document your decision making why or why not admission, treatments were needed:1} Final Clinical Impression(s) / ED Diagnoses Final diagnoses:  None    Rx / DC Orders ED Discharge Orders     None

## 2021-08-28 NOTE — ED Provider Triage Note (Signed)
Emergency Medicine Provider Triage Evaluation Note  [name] , a 81 y.o. female  was evaluated in triage.  Pt complains of chest pain.  Patient report for the past 4 to 6 weeks she has had intermittent left-sided chest pain that happen sporadically.  Furthermore, she endorses pressure behind both eyes, having occasional pain in her abdomen, and overall not feeling well.  Endorsed bouts of dizziness.  Admits to history of diabetes and having been compliant with her diet.  Denies fever, productive cough, or dysuria.  Denies focal numbness or focal weakness.  Review of Systems  Positive: As above Negative: As above  Physical Exam  BP (!) 177/65 (BP Location: Right Arm)   Pulse 64   Temp 98.4 F (36.9 C) (Oral)   Resp (!) 25   Ht '4\' 11"'$  (1.499 m)   Wt 67.1 kg   SpO2 100%   BMI 29.89 kg/m  Gen:   Awake, no distress   Resp:  Normal effort  MSK:   Moves extremities without difficulty  Other:    Medical Decision Making  Medically screening exam initiated at 7:54 PM.  Appropriate orders placed.  FEROL LAICHE was informed that the remainder of the evaluation will be completed by another provider, this initial triage assessment does not replace that evaluation, and the importance of remaining in the ED until their evaluation is complete.     Domenic Moras, PA-C 08/28/21 2129

## 2021-08-29 ENCOUNTER — Emergency Department (HOSPITAL_COMMUNITY): Payer: Medicare PPO

## 2021-08-29 DIAGNOSIS — I700 Atherosclerosis of aorta: Secondary | ICD-10-CM | POA: Diagnosis not present

## 2021-08-29 DIAGNOSIS — R109 Unspecified abdominal pain: Secondary | ICD-10-CM | POA: Diagnosis not present

## 2021-08-29 LAB — HEPATIC FUNCTION PANEL
ALT: 19 U/L (ref 0–44)
AST: 25 U/L (ref 15–41)
Albumin: 4.1 g/dL (ref 3.5–5.0)
Alkaline Phosphatase: 82 U/L (ref 38–126)
Bilirubin, Direct: 0.2 mg/dL (ref 0.0–0.2)
Indirect Bilirubin: 0.6 mg/dL (ref 0.3–0.9)
Total Bilirubin: 0.8 mg/dL (ref 0.3–1.2)
Total Protein: 7.9 g/dL (ref 6.5–8.1)

## 2021-08-29 LAB — CBG MONITORING, ED: Glucose-Capillary: 64 mg/dL — ABNORMAL LOW (ref 70–99)

## 2021-08-29 LAB — LIPASE, BLOOD: Lipase: 49 U/L (ref 11–51)

## 2021-08-29 MED ORDER — MORPHINE SULFATE (PF) 4 MG/ML IV SOLN
4.0000 mg | Freq: Once | INTRAVENOUS | Status: AC
Start: 2021-08-29 — End: 2021-08-29
  Administered 2021-08-29: 4 mg via INTRAVENOUS
  Filled 2021-08-29: qty 1

## 2021-08-29 MED ORDER — ALUM & MAG HYDROXIDE-SIMETH 200-200-20 MG/5ML PO SUSP
30.0000 mL | Freq: Once | ORAL | Status: AC
Start: 2021-08-29 — End: 2021-08-29
  Administered 2021-08-29: 30 mL via ORAL
  Filled 2021-08-29: qty 30

## 2021-08-29 MED ORDER — ONDANSETRON HCL 4 MG/2ML IJ SOLN
4.0000 mg | Freq: Once | INTRAMUSCULAR | Status: AC
  Administered 2021-08-29: 4 mg via INTRAVENOUS
  Filled 2021-08-29: qty 2

## 2021-08-29 MED ORDER — PANTOPRAZOLE SODIUM 40 MG IV SOLR
40.0000 mg | Freq: Once | INTRAVENOUS | Status: AC
  Administered 2021-08-29: 40 mg via INTRAVENOUS
  Filled 2021-08-29: qty 10

## 2021-08-29 MED ORDER — ACETAMINOPHEN 500 MG PO TABS
1000.0000 mg | ORAL_TABLET | Freq: Four times a day (QID) | ORAL | Status: DC | PRN
  Filled 2021-08-29: qty 2

## 2021-08-29 MED ORDER — PANTOPRAZOLE SODIUM 40 MG PO TBEC: 40.0000 mg | DELAYED_RELEASE_TABLET | Freq: Every day | ORAL | 0 refills | Status: DC

## 2021-08-29 MED ORDER — SODIUM CHLORIDE 0.9 % IV SOLN
12.5000 mg | Freq: Once | INTRAVENOUS | Status: AC
  Administered 2021-08-29: 12.5 mg via INTRAVENOUS
  Filled 2021-08-29: qty 12.5

## 2021-08-29 NOTE — Discharge Instructions (Signed)
You were seen today for chest and abdominal pain as well as several other symptoms.  Your work-up today is reassuring.  You may benefit from gastroenterology evaluation.  In the meantime, make sure that you are taking Protonix.  If you develop new or worsening symptoms, you should be reevaluated.

## 2021-08-29 NOTE — ED Notes (Signed)
Gluc 64. Gcs 15. Provided pt w/ oj and pb crackers.

## 2021-08-30 ENCOUNTER — Other Ambulatory Visit: Payer: Self-pay | Admitting: Family Medicine

## 2021-09-01 ENCOUNTER — Emergency Department (HOSPITAL_COMMUNITY)
Admission: EM | Admit: 2021-09-01 | Discharge: 2021-09-01 | Disposition: A | Discharge status: Home / Self Care | Payer: Medicare PPO | Attending: Emergency Medicine | Admitting: Emergency Medicine

## 2021-09-01 ENCOUNTER — Other Ambulatory Visit: Payer: Self-pay

## 2021-09-01 ENCOUNTER — Encounter (HOSPITAL_COMMUNITY): Payer: Self-pay

## 2021-09-01 ENCOUNTER — Emergency Department (HOSPITAL_COMMUNITY): Payer: Medicare PPO

## 2021-09-01 DIAGNOSIS — R059 Cough, unspecified: Secondary | ICD-10-CM | POA: Diagnosis not present

## 2021-09-01 DIAGNOSIS — R531 Weakness: Secondary | ICD-10-CM | POA: Diagnosis not present

## 2021-09-01 DIAGNOSIS — R5383 Other fatigue: Secondary | ICD-10-CM | POA: Diagnosis not present

## 2021-09-01 DIAGNOSIS — Z7982 Long term (current) use of aspirin: Secondary | ICD-10-CM | POA: Insufficient documentation

## 2021-09-01 LAB — COMPREHENSIVE METABOLIC PANEL
ALT: 24 U/L (ref 0–44)
AST: 29 U/L (ref 15–41)
Albumin: 3.8 g/dL (ref 3.5–5.0)
Alkaline Phosphatase: 78 U/L (ref 38–126)
Anion gap: 7 (ref 5–15)
BUN: 21 mg/dL (ref 8–23)
CO2: 25 mmol/L (ref 22–32)
Calcium: 9.7 mg/dL (ref 8.9–10.3)
Chloride: 105 mmol/L (ref 98–111)
Creatinine, Ser: 1.64 mg/dL — ABNORMAL HIGH (ref 0.44–1.00)
GFR, Estimated: 31 mL/min — ABNORMAL LOW (ref >60)
Glucose, Bld: 234 mg/dL — ABNORMAL HIGH (ref 70–99)
Potassium: 5.4 mmol/L — ABNORMAL HIGH (ref 3.5–5.1)
Sodium: 137 mmol/L (ref 135–145)
Total Bilirubin: 0.7 mg/dL (ref 0.3–1.2)
Total Protein: 8 g/dL (ref 6.5–8.1)

## 2021-09-01 LAB — CBC WITH DIFFERENTIAL/PLATELET
Abs Immature Granulocytes: 0.02 10*3/uL (ref 0.00–0.07)
Basophils Absolute: 0 10*3/uL (ref 0.0–0.1)
Basophils Relative: 1 %
Eosinophils Absolute: 0.2 10*3/uL (ref 0.0–0.5)
Eosinophils Relative: 4 %
HCT: 37.5 % (ref 36.0–46.0)
Hemoglobin: 12.3 g/dL (ref 12.0–15.0)
Immature Granulocytes: 1 %
Lymphocytes Relative: 31 %
Lymphs Abs: 1.3 10*3/uL (ref 0.7–4.0)
MCH: 30.2 pg (ref 26.0–34.0)
MCHC: 32.8 g/dL (ref 30.0–36.0)
MCV: 92.1 fL (ref 80.0–100.0)
Monocytes Absolute: 0.4 10*3/uL (ref 0.1–1.0)
Monocytes Relative: 10 %
Neutro Abs: 2.2 10*3/uL (ref 1.7–7.7)
Neutrophils Relative %: 53 %
Platelets: 223 10*3/uL (ref 150–400)
RBC: 4.07 MIL/uL (ref 3.87–5.11)
RDW: 13.2 % (ref 11.5–15.5)
WBC: 4.1 10*3/uL (ref 4.0–10.5)
nRBC: 0 % (ref 0.0–0.2)

## 2021-09-01 LAB — URINALYSIS, ROUTINE W REFLEX MICROSCOPIC
Bacteria, UA: NONE SEEN
Bilirubin Urine: NEGATIVE
Glucose, UA: >=500 mg/dL — AB
Hgb urine dipstick: NEGATIVE
Ketones, ur: NEGATIVE mg/dL
Leukocytes,Ua: NEGATIVE
Nitrite: NEGATIVE
Protein, ur: NEGATIVE mg/dL
Specific Gravity, Urine: 1.002 — ABNORMAL LOW (ref 1.005–1.030)
pH: 7 (ref 5.0–8.0)

## 2021-09-01 LAB — TSH: TSH: 1.059 u[IU]/mL (ref 0.350–4.500)

## 2021-09-01 LAB — TROPONIN I (HIGH SENSITIVITY): Troponin I (High Sensitivity): 4 ng/L (ref <18)

## 2021-09-01 LAB — LIPASE, BLOOD: Lipase: 47 U/L (ref 11–51)

## 2021-09-01 MED ORDER — LACTATED RINGERS IV SOLN: INTRAVENOUS | Status: DC

## 2021-09-01 MED ORDER — LACTATED RINGERS IV BOLUS
1000.0000 mL | Freq: Once | INTRAVENOUS | Status: AC
  Administered 2021-09-01: 1000 mL via INTRAVENOUS

## 2021-09-01 NOTE — ED Triage Notes (Signed)
"  Weakness, abdominal pain, urinary frequency, just feel sick. I was here a few days ago for the same and I feel like you all are missing something" per pt

## 2021-09-01 NOTE — ED Provider Notes (Signed)
[facility] DEPT Provider Note   CSN: [id number] Arrival date & time: 09/01/21  1053     History  No chief complaint on file.   [name] is a 81 y.o. female.  81 year old female presents with 3-week history of lower abdominal discomfort as well as generalized weakness.  Seen here 4 days ago for same symptoms and that work-up was reviewed which included cardiac and abdominal evaluation which was negative.  Has been seen by her doctor for similar symptoms several weeks ago Dr. Berdine Addison without diagnosis.  Today she denies any fever or chills.  No emesis noted.  Some nausea.  No dark stools.  Abdominal discomfort is without urinary symptoms.  Has crampiness in her mid abdomen without syncope or near syncope.  Is not currently using any medications for this.  States that she wakes in the morning and just feels weak and tired.  Denies any new medication       Home Medications Prior to Admission medications   Medication Sig Start Date End Date Taking? Authorizing Provider  acetaminophen (TYLENOL 8 HOUR) 650 MG CR tablet Take 1 tablet (650 mg total) by mouth every 8 (eight) hours as needed for pain. Patient taking differently: Take 650 mg by mouth in the morning and at bedtime. 02/18/20   Suzy Bouchard, PA-C  albuterol (PROVENTIL,VENTOLIN) 90 MCG/ACT inhaler Inhale 2 puffs into the lungs every 4 (four) hours as needed for wheezing or shortness of breath.     [provider]  amLODipine (NORVASC) 5 MG tablet TAKE 1 TABLET BY MOUTH EVERY DAY Patient taking differently: Take 10 mg by mouth daily. 06/28/19   Patwardhan, Reynold Bowen, MD  aspirin EC 81 MG tablet Take 81 mg by mouth at bedtime.    [provider]  atorvastatin (LIPITOR) 10 MG tablet Take 10 mg by mouth every morning.    [provider]  cholecalciferol (VITAMIN D) 1000 units tablet Take 1,000 Units by mouth daily.    [provider]  donepezil (ARICEPT) 10 MG  tablet Take 1 tablet (10 mg total) by mouth at bedtime. Must be seen for further refills. Call [phone]. 12/27/20   Ward Givens, NP  fluticasone (FLONASE) 50 MCG/ACT nasal spray Place 1 spray into both nostrils daily. Patient taking differently: Place 1 spray into both nostrils daily as needed for allergies. 08/27/20   Volanda Napoleon, PA-C  furosemide (LASIX) 20 MG tablet Take 10 mg by mouth daily. 07/25/21   [provider]  gabapentin (NEURONTIN) 300 MG capsule Take 1 capsule (300 mg total) by mouth at bedtime. Patient taking differently: Take 300 mg by mouth 2 (two) times daily. 07/16/16   Wallene Huh, DPM  insulin degludec (TRESIBA) 200 UNIT/ML FlexTouch Pen Inject 80 Units into the skin daily before breakfast.    [provider]  metoprolol succinate (TOPROL-XL) 100 MG 24 hr tablet TAKE 1 TABLET BY MOUTH EVERY DAY Patient taking differently: Take 100 mg by mouth at bedtime. 07/17/21   [name], MD  mirabegron ER (MYRBETRIQ) 50 MG TB24 tablet Take 50 mg by mouth daily. 06/18/21   [provider]  Multiple Vitamins-Minerals (CENTRUM SILVER ADULT 50+ PO) Take 1 tablet by mouth daily.    [provider]  pantoprazole (PROTONIX) 40 MG tablet Take 1 tablet (40 mg total) by mouth daily. 08/29/21   Horton, Barbette Hair, MD      Allergies    Sulfa antibiotics    Review of Systems  Review of Systems  All other systems reviewed and are negative.   Physical Exam Updated Vital Signs BP (!) 173/68 (BP Location: Right Arm)   Pulse 60   Temp 98.2 F (36.8 C) (Oral)   Resp 18   SpO2 98%  Physical Exam Vitals and nursing note reviewed.  Constitutional:      General: She is not in acute distress.    Appearance: Normal appearance. She is well-developed. She is not toxic-appearing.  HENT:     Head: Normocephalic and atraumatic.  Eyes:     General: Lids are normal.     Conjunctiva/sclera: Conjunctivae normal.     Pupils: Pupils are equal, round, and  reactive to light.  Neck:     Thyroid: No thyroid mass.     Trachea: No tracheal deviation.  Cardiovascular:     Rate and Rhythm: Normal rate and regular rhythm.     Heart sounds: Normal heart sounds. No murmur heard.    No gallop.  Pulmonary:     Effort: Pulmonary effort is normal. No respiratory distress.     Breath sounds: Normal breath sounds. No stridor. No decreased breath sounds, wheezing, rhonchi or rales.  Abdominal:     General: There is no distension.     Palpations: Abdomen is soft.     Tenderness: There is no abdominal tenderness. There is no rebound.  Musculoskeletal:        General: No tenderness. Normal range of motion.     Cervical back: Normal range of motion and neck supple.  Skin:    General: Skin is warm and dry.     Findings: No abrasion or rash.  Neurological:     Mental Status: She is alert and oriented to person, place, and time. Mental status is at baseline.     GCS: GCS eye subscore is 4. GCS verbal subscore is 5. GCS motor subscore is 6.     Cranial Nerves: No cranial nerve deficit.     Sensory: No sensory deficit.     Motor: Motor function is intact.  Psychiatric:        Attention and Perception: Attention normal.        Speech: Speech normal.        Behavior: Behavior normal.     ED Results / Procedures / Treatments   Labs (all labs ordered are listed, but only abnormal results are displayed) Labs Reviewed  CBC WITH DIFFERENTIAL/PLATELET  COMPREHENSIVE METABOLIC PANEL  LIPASE, BLOOD  URINALYSIS, ROUTINE W REFLEX MICROSCOPIC  TROPONIN I (HIGH SENSITIVITY)    EKG EKG Interpretation  Date/Time:  Saturday September 01 2021 12:18:30 EDT Ventricular Rate:  54 PR Interval:  182 QRS Duration: 74 QT Interval:  432 QTC Calculation: 410 R Axis:   63 Text Interpretation: Sinus rhythm Borderline ST elevation, inferior leads Confirmed by Lacretia Leigh ([id number]) on 09/01/2021 1:43:34 PM  Radiology No results found.  Procedures Procedures     Medications Ordered in ED Medications  lactated ringers bolus 1,000 mL (has no administration in time range)  lactated ringers infusion (has no administration in time range)    ED Course/ Medical Decision Making/ A&P                           Medical Decision Making Amount and/or Complexity of Data Reviewed Labs: ordered. Radiology: ordered. ECG/medicine tests: ordered.  Risk Prescription drug management.   Patient's EKG per my interpretation shows normal sinus rhythm.  No signs of acute changes.  Chest x-ray per my interpretation shows no acute findings of O2.  She has no leukocytosis on CBC.  Hemoglobin is stable.  Slight hyperglycemia noted.  But no evidence of DKA.  She has chronic kidney disease and her creatinine is essentially unchanged from prior.  Troponin was always performed for evaluation of possible ACS and that was negative.  Patient has no focal weakness and do not feel that she needs to have a head CT at this time.  Had abdominal CT recently and do not feel that that was indicated to repeat.  Vital signs are stable.  No definitive cause of her weakness.  Patient very frustrated with this but understands.  Will discharge at this time as she does not require hospitalization she will follow-up with her doctor        Final Clinical Impression(s) / ED Diagnoses Final diagnoses:  None    Rx / DC Orders ED Discharge Orders     None         Lacretia Leigh, MD 09/01/21 1444

## 2021-09-04 DIAGNOSIS — R110 Nausea: Secondary | ICD-10-CM | POA: Diagnosis not present

## 2021-09-04 DIAGNOSIS — K580 Irritable bowel syndrome with diarrhea: Secondary | ICD-10-CM | POA: Diagnosis not present

## 2021-09-04 DIAGNOSIS — N184 Chronic kidney disease, stage 4 (severe): Secondary | ICD-10-CM | POA: Diagnosis not present

## 2021-09-04 DIAGNOSIS — I129 Hypertensive chronic kidney disease with stage 1 through stage 4 chronic kidney disease, or unspecified chronic kidney disease: Secondary | ICD-10-CM | POA: Diagnosis not present

## 2021-09-04 DIAGNOSIS — Z0000 Encounter for general adult medical examination without abnormal findings: Secondary | ICD-10-CM | POA: Diagnosis not present

## 2021-09-04 DIAGNOSIS — E1122 Type 2 diabetes mellitus with diabetic chronic kidney disease: Secondary | ICD-10-CM | POA: Diagnosis not present

## 2021-09-04 DIAGNOSIS — R600 Localized edema: Secondary | ICD-10-CM | POA: Diagnosis not present

## 2021-09-04 DIAGNOSIS — N644 Mastodynia: Secondary | ICD-10-CM | POA: Diagnosis not present

## 2021-09-04 DIAGNOSIS — K219 Gastro-esophageal reflux disease without esophagitis: Secondary | ICD-10-CM | POA: Diagnosis not present

## 2021-09-07 DIAGNOSIS — C641 Malignant neoplasm of right kidney, except renal pelvis: Secondary | ICD-10-CM | POA: Diagnosis not present

## 2021-09-07 DIAGNOSIS — N184 Chronic kidney disease, stage 4 (severe): Secondary | ICD-10-CM | POA: Diagnosis not present

## 2021-09-07 DIAGNOSIS — N2581 Secondary hyperparathyroidism of renal origin: Secondary | ICD-10-CM | POA: Diagnosis not present

## 2021-09-07 DIAGNOSIS — D631 Anemia in chronic kidney disease: Secondary | ICD-10-CM | POA: Diagnosis not present

## 2021-09-07 DIAGNOSIS — N189 Chronic kidney disease, unspecified: Secondary | ICD-10-CM | POA: Diagnosis not present

## 2021-09-07 DIAGNOSIS — I129 Hypertensive chronic kidney disease with stage 1 through stage 4 chronic kidney disease, or unspecified chronic kidney disease: Secondary | ICD-10-CM | POA: Diagnosis not present

## 2021-09-07 DIAGNOSIS — N390 Urinary tract infection, site not specified: Secondary | ICD-10-CM | POA: Diagnosis not present

## 2021-09-07 DIAGNOSIS — C642 Malignant neoplasm of left kidney, except renal pelvis: Secondary | ICD-10-CM | POA: Diagnosis not present

## 2021-09-07 DIAGNOSIS — E1122 Type 2 diabetes mellitus with diabetic chronic kidney disease: Secondary | ICD-10-CM | POA: Diagnosis not present

## 2021-09-17 DIAGNOSIS — Z794 Long term (current) use of insulin: Secondary | ICD-10-CM | POA: Diagnosis not present

## 2021-09-17 DIAGNOSIS — E1122 Type 2 diabetes mellitus with diabetic chronic kidney disease: Secondary | ICD-10-CM | POA: Diagnosis not present

## 2021-09-17 DIAGNOSIS — I129 Hypertensive chronic kidney disease with stage 1 through stage 4 chronic kidney disease, or unspecified chronic kidney disease: Secondary | ICD-10-CM | POA: Diagnosis not present

## 2021-09-18 DIAGNOSIS — N644 Mastodynia: Secondary | ICD-10-CM | POA: Diagnosis not present

## 2021-09-18 DIAGNOSIS — R921 Mammographic calcification found on diagnostic imaging of breast: Secondary | ICD-10-CM | POA: Diagnosis not present

## 2021-09-19 ENCOUNTER — Ambulatory Visit: Payer: Self-pay

## 2021-09-19 NOTE — Patient Outreach (Signed)
  Care Coordination   Initial Visit Note   09/19/2021 Name: [name] MRN: [id number] DOB: 09-04-1940  [name] is a 81 y.o. year old female who sees [name], MD for primary care. I spoke with  [name] by phone today  What matters to the patients health and wellness today?  Begin to feel better    Goals Addressed             This Visit's Progress    COMPLETED: Care Coordination Activities       Care Coordination Interventions: SDoH assessment performed - no acute challenges at this time Discussed patients daughter prefers the patient stop driving Educated patient on transportation resource options in the event they are needed Fall risk assessment completed Discussed patient underwent diagnostic mammogram 09/18/21 which was negative Performed chart review to note several ED visits Referral placed to RN Care Coordinator     Education and Benefit of Advance Care Planning       Care Coordination Interventions: Determined patient does not have an Advance Directive but is interested in completing one Mailed Advance Directive packet to the patients home address for review Scheduled follow up call to assist with completion        SDOH assessments and interventions completed:  Yes  SDOH Interventions Today    Flowsheet Row Most Recent Value  SDOH Interventions   Food Insecurity Interventions Intervention Not Indicated  Housing Interventions Intervention Not Indicated  Transportation Interventions Intervention Not Indicated        Care Coordination Interventions Activated:  Yes  Care Coordination Interventions:  Yes, provided   Follow up plan: Referral made to RN Care Coordinator. Scheduled SW follow up call to review Advance Directive packet with the patient.    Encounter Outcome:  Pt. Visit Completed   Daneen Schick, BSW, CDP Social Worker, Certified Dementia Practitioner Care Coordination [phone]

## 2021-09-19 NOTE — Patient Instructions (Signed)
Visit Information  Thank you for taking time to visit with me today. Please don't hesitate to contact me if I can be of assistance to you.   Following are the goals we discussed today:   Goals Addressed             This Visit's Progress    COMPLETED: Care Coordination Activities       Care Coordination Interventions: SDoH assessment performed - no acute challenges at this time Discussed patients daughter prefers the patient stop driving Educated patient on transportation resource options in the event they are needed Fall risk assessment completed Discussed patient underwent diagnostic mammogram 09/18/21 which was negative Performed chart review to note several ED visits Referral placed to RN Care Coordinator     Education and Benefit of Advance Care Planning       Care Coordination Interventions: Determined patient does not have an Advance Directive but is interested in completing one Mailed Advance Directive packet to the patients home address for review Scheduled follow up call to assist with completion        Our next appointment is by telephone on 8/23 at 1:00  Please call the care guide team at [phone] if you need to cancel or reschedule your appointment.   If you are experiencing a Mental Health or [place] or need someone to talk to, please go to [facility] [address] [place] [phone])  Patient verbalizes understanding of instructions and care plan provided today and agrees to view in [place]. Active MyChart status and patient understanding of how to access instructions and care plan via MyChart confirmed with patient.     Telephone follow up appointment with care management team member scheduled for:8/23 to review Advance Directive packet  Daneen Schick, [place], CDP Social Worker, Certified Dementia Practitioner Care Coordination [phone]

## 2021-09-25 ENCOUNTER — Ambulatory Visit: Payer: Self-pay

## 2021-09-25 NOTE — Patient Outreach (Signed)
  Care Coordination   Initial Visit Note   09/25/2021 Name: [name] MRN: [id number] DOB: Apr 22, 1940  [name] is a 81 y.o. year old female who sees Stefanie Beard, MD for primary care. I spoke with  [name] by phone today  What matters to the patients health and wellness today?  Patient would like to work on better managing her diabetes.     Goals Addressed      "To do better with my diabetes"       Care Coordination Interventions: Provided education to patient about basic DM disease process Reviewed medications with patient and discussed importance of medication adherence Counseled on importance of regular laboratory monitoring as prescribed Provided patient with written educational materials related to hypo and hyperglycemia and importance of correct treatment Advised patient, providing education and rationale, to check cbg daily before meals and record, calling PCP for findings outside established parameters Review of patient status, including review of consultants reports, relevant laboratory and other test results, and medications completed Placed outbound call to PCP, Dr. Iona [name] to assist with scheduling post hospital/ED follow up Reviewed upcoming PCP post ED follow up scheduled with PCP, Dr. Iona [name], on 10/09/21 '@11'$ :15 AM  Mailed printed educational materials to patient's home for review related to Diabetes management to be reviewed and discussed at next RN CC follow up call     SDOH assessments and interventions completed:  Yes     Care Coordination Interventions Activated:  Yes  Care Coordination Interventions:  Yes, provided   Follow up plan: Follow up call scheduled for 10/23/21 '@12'$ :30 PM     Encounter Outcome:  Pt. Visit Completed

## 2021-09-25 NOTE — Patient Instructions (Signed)
Visit Information  Thank you for taking time to visit with me today. Please don't hesitate to contact me if I can be of assistance to you.   Following are the goals we discussed today:   Goals Addressed      "To do better with my diabetes"       Care Coordination Interventions: Provided education to patient about basic DM disease process Reviewed medications with patient and discussed importance of medication adherence Counseled on importance of regular laboratory monitoring as prescribed Provided patient with written educational materials related to hypo and hyperglycemia and importance of correct treatment Advised patient, providing education and rationale, to check cbg daily before meals and record, calling PCP for findings outside established parameters Review of patient status, including review of consultants reports, relevant laboratory and other test results, and medications completed Placed outbound call to PCP, Dr. [name] to assist with scheduling post hospital/ED follow up Reviewed upcoming PCP post ED follow up scheduled with PCP, Dr. [name], on 10/09/21 '@11'$ :15 AM  Mailed printed educational materials to patient's home for review related to Diabetes management to be reviewed and discussed at next RN CC follow up call     Our next appointment is by telephone on 10/23/21 at 12:30 PM   Please call the care guide team at [phone] if you need to cancel or reschedule your appointment.   If you are experiencing a Mental Health or [place] or need someone to talk to, please call 1-800-273-TALK (toll free, 24 hour hotline)  Patient verbalizes understanding of instructions and care plan provided today and agrees to view in [place]. Active MyChart status and patient understanding of how to access instructions and care plan via MyChart confirmed with patient.     Barb Merino, RN, BSN, CCM Care Management Coordinator [facility] Care Management Direct Phone:  [phone]

## 2021-10-10 ENCOUNTER — Ambulatory Visit: Payer: Self-pay

## 2021-10-10 NOTE — Patient Outreach (Signed)
  Care Coordination   10/10/2021 Name: [name] MRN: [id number] DOB: 1940/12/23   Care Coordination Outreach Attempts:  An unsuccessful telephone outreach was attempted today to offer the patient information about available care coordination services as a benefit of their health plan.   Follow Up Plan:  Additional outreach attempts will be made to offer the patient care coordination information and services.   Encounter Outcome:  No Answer  Care Coordination Interventions Activated:  No   Care Coordination Interventions:  No, not indicated    Daneen Schick, BSW, CDP Social Worker, Certified Dementia Practitioner Care Coordination [phone]

## 2021-10-13 ENCOUNTER — Other Ambulatory Visit: Payer: Self-pay | Admitting: Cardiology

## 2021-10-13 DIAGNOSIS — I10 Essential (primary) hypertension: Secondary | ICD-10-CM

## 2021-10-23 ENCOUNTER — Ambulatory Visit: Payer: Self-pay

## 2021-10-23 NOTE — Patient Outreach (Signed)
  Care Coordination   10/23/2021 Name: [name] MRN: [id number] DOB: 01/28/1941   Care Coordination Outreach Attempts:  An unsuccessful telephone outreach was attempted for a scheduled appointment today.  Follow Up Plan:  Additional outreach attempts will be made to offer the patient care coordination information and services.   Encounter Outcome:  No Answer  Care Coordination Interventions Activated:  No   Care Coordination Interventions:  No, not indicated    Barb Merino, RN, BSN, CCM Care Management Coordinator [facility] Care Management Direct Phone: [phone]

## 2021-11-05 ENCOUNTER — Other Ambulatory Visit: Payer: Self-pay | Admitting: Cardiology

## 2021-11-05 DIAGNOSIS — I10 Essential (primary) hypertension: Secondary | ICD-10-CM

## 2021-11-06 ENCOUNTER — Ambulatory Visit: Payer: Self-pay

## 2021-11-06 NOTE — Patient Instructions (Signed)
Visit Information  Thank you for taking time to visit with me today. Please don't hesitate to contact me if I can be of assistance to you.   Following are the goals we discussed today:   Goals Addressed             This Visit's Progress    "To do better with my diabetes"       Care Coordination Interventions: Provided education to patient about basic DM disease process Confirmed patient received and reviewed the printed diabetes educational materials, she denies questions Determined patient is currently out of her Accu-chek test strips, therefore she has not been checking her sugars Discussed patient contacted Dr. Cathey Endow office to request a refill without success Placed outbound call to Dr. Cathey Endow office, left a message for nurse [name] requesting a call back to discuss patients refill needs Encouraged patient to resume monitoring her blood sugars as directed once she receives her test strips Reviewed upcoming scheduled appointment with Dr. Zigmund Daniel for retina follow up set for 12/12/21 '@2'$ :00 PM         Our next appointment is by telephone on 12/14/21 at 09:30 AM  Please call the care guide team at [phone] if you need to cancel or reschedule your appointment.   If you are experiencing a Mental Health or [place] or need someone to talk to, please call 1-800-273-TALK (toll free, 24 hour hotline)  Patient verbalizes understanding of instructions and care plan provided today and agrees to view in [place]. Active MyChart status and patient understanding of how to access instructions and care plan via MyChart confirmed with patient.     Barb Merino, RN, BSN, CCM Care Management Coordinator [place] Management Direct Phone: [phone]

## 2021-11-06 NOTE — Patient Outreach (Signed)
  Care Coordination   Follow Up Visit Note   11/06/2021 Name: [name] MRN: [id number] DOB: [birth year]  [name] is a 81 y.o. year old female who sees Stefanie Beard, MD for primary care. I spoke with  [name] by phone today.  What matters to the patients health and wellness today?  Patient needs help getting a refill for her Accu-Chek test strips.     Goals Addressed             This Visit's Progress    "To do better with my diabetes"       Care Coordination Interventions: Provided education to patient about basic DM disease process Confirmed patient received and reviewed the printed diabetes educational materials, she denies questions Determined patient is currently out of her Accu-chek test strips, therefore she has not been checking her sugars Discussed patient contacted [name] office to request a refill without success Placed outbound call to [name] office, left a message for nurse [name] requesting a call back to discuss patients refill needs Encouraged patient to resume monitoring her blood sugars as directed once she receives her test strips Reviewed upcoming scheduled appointment with [name] for retina follow up set for 12/12/21 '@2'$ :00 PM         SDOH assessments and interventions completed:  No     Care Coordination Interventions Activated:  Yes  Care Coordination Interventions:  Yes, provided   Follow up plan: Follow up call scheduled for 12/14/21 '@09'$ :30 AM     Encounter Outcome:  Pt. Visit Completed

## 2021-11-07 ENCOUNTER — Ambulatory Visit: Payer: Self-pay

## 2021-11-08 NOTE — Patient Outreach (Signed)
  Care Coordination   Provider Collaboration Visit Note   11/08/2021 Name: [name] MRN: [id number] DOB: 04/08/1940  [name] is a 81 y.o. year old female who sees [name], MD for primary care. I  spoke with nurse [name] for Dr. Berdine Addison today regarding the need for a refill for patient's Accu-Chek test strips.   What matters to the patients health and wellness today?  Not applicable     Goals Addressed             This Visit's Progress    "To do better with my diabetes"       Care Coordination Interventions: Received a voice message from [facility], nurse for Dr. Berdine Addison requesting a return call Placed outbound call to [facility] nurse for Dr. Berdine Addison [phone], discussed refill is needed for patient's Accu-Chek test strips Discussed [name] has submitted a refill to the pharmacy         SDOH assessments and interventions completed:  No     Care Coordination Interventions Activated:  Yes  Care Coordination Interventions:  Yes, provided   Follow up plan: Follow up call scheduled for 12/14/21 '@09'$ :30 AM    Encounter Outcome:  Pt. Visit Completed

## 2021-11-19 DIAGNOSIS — I129 Hypertensive chronic kidney disease with stage 1 through stage 4 chronic kidney disease, or unspecified chronic kidney disease: Secondary | ICD-10-CM | POA: Diagnosis not present

## 2021-11-19 DIAGNOSIS — R600 Localized edema: Secondary | ICD-10-CM | POA: Diagnosis not present

## 2021-11-19 DIAGNOSIS — I10 Essential (primary) hypertension: Secondary | ICD-10-CM | POA: Diagnosis not present

## 2021-11-19 DIAGNOSIS — R5382 Chronic fatigue, unspecified: Secondary | ICD-10-CM | POA: Diagnosis not present

## 2021-11-19 DIAGNOSIS — E1122 Type 2 diabetes mellitus with diabetic chronic kidney disease: Secondary | ICD-10-CM | POA: Diagnosis not present

## 2021-11-19 DIAGNOSIS — Z6829 Body mass index (BMI) 29.0-29.9, adult: Secondary | ICD-10-CM | POA: Diagnosis not present

## 2021-11-19 DIAGNOSIS — N184 Chronic kidney disease, stage 4 (severe): Secondary | ICD-10-CM | POA: Diagnosis not present

## 2021-11-28 ENCOUNTER — Telehealth: Payer: Self-pay | Admitting: *Deleted

## 2021-12-01 IMAGING — MR MR ABDOMEN W/O CM
5 of 7 series · 23 of 48 positions shown · non-contrast
Comparison: 01/08/2018 MRI abdomen.  03/25/2018 CT abdomen/pelvis.

CLINICAL DATA: Status post right nephrectomy 03/20/2015 for renal
cell carcinoma. Status post partial left nephrectomy 10/06/2008 for
renal cell carcinoma. Restaging.

EXAM:
MRI ABDOMEN WITHOUT CONTRAST
TECHNIQUE: Multiplanar multisequence MR imaging was performed without the
administration of intravenous contrast.

[Series 4: T2 fat-sat · axial · 5.0mm · 0.78mm/px · z∈[-24,+201]mm · 5 of 46 slices shown]
[im 1/46]
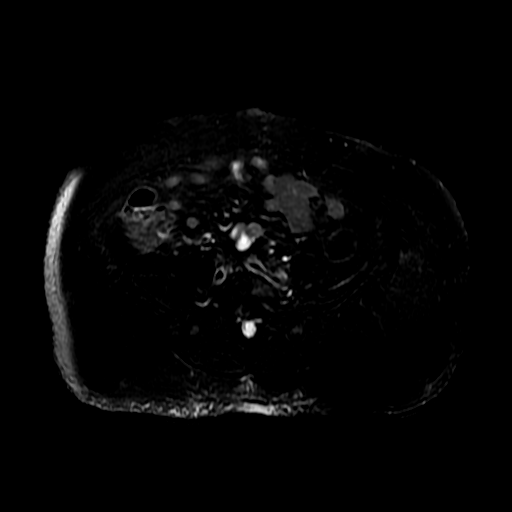
[im 12/46]
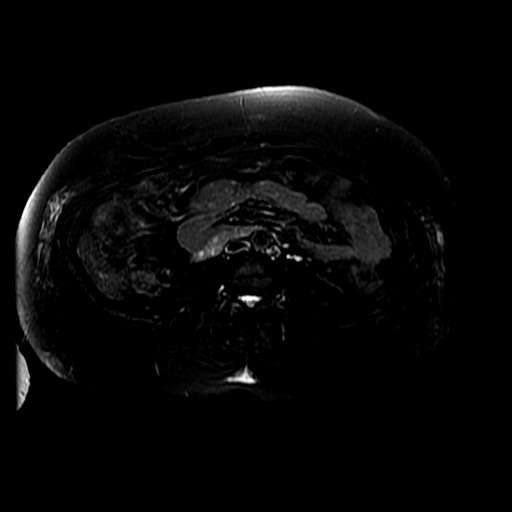
[im 23/46]
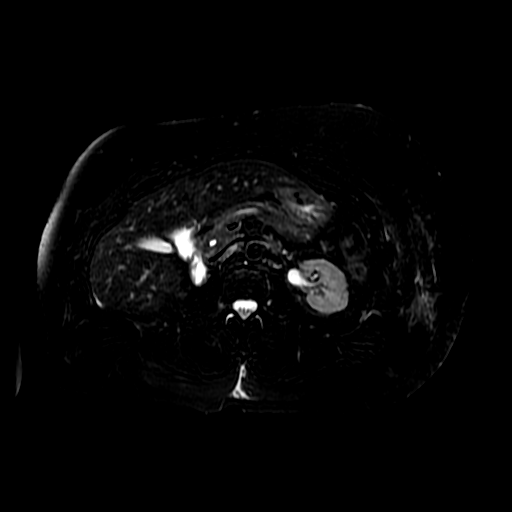
[im 34/46]
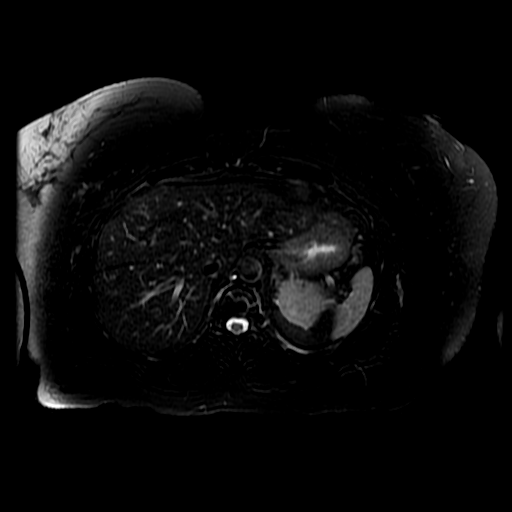
[im 46/46]
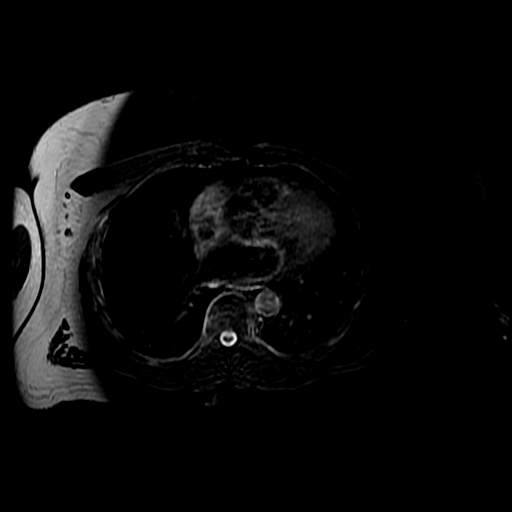

[Series 5: DWI b500 · axial · 6.0mm · 1.48mm/px · z∈[-23,+219]mm · 7 of 64 slices shown]
[im 1/64]
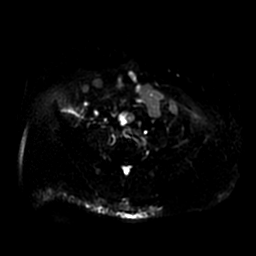
[im 11/64]
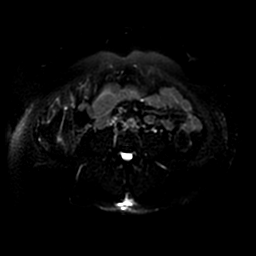
[im 22/64]
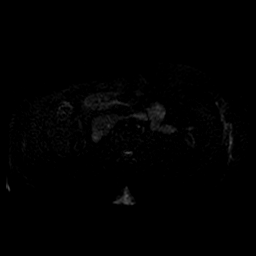
[im 32/64]
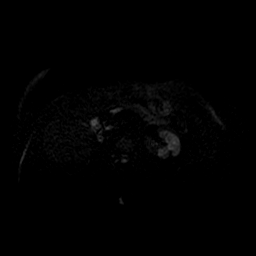
[im 43/64]
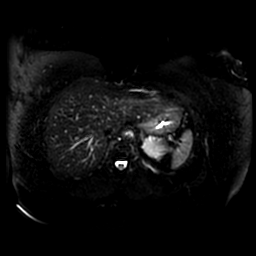
[im 53/64]
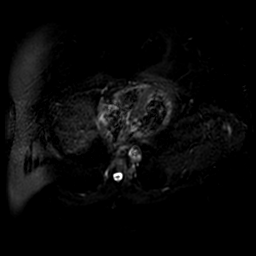
[im 64/64]
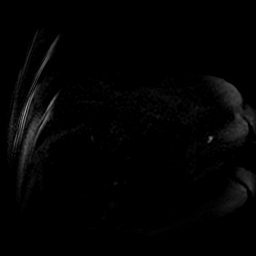

[Series 6: T2 · axial · 5.0mm · 0.78mm/px · z∈[-25,+220]mm · 5 of 50 slices shown (1 of 2)]
[im 1/50]
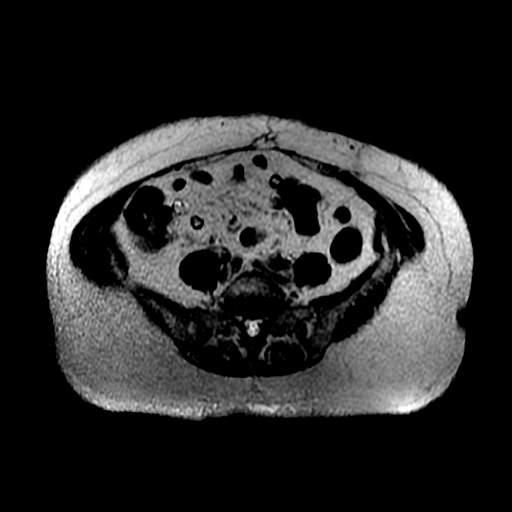
[im 13/50]
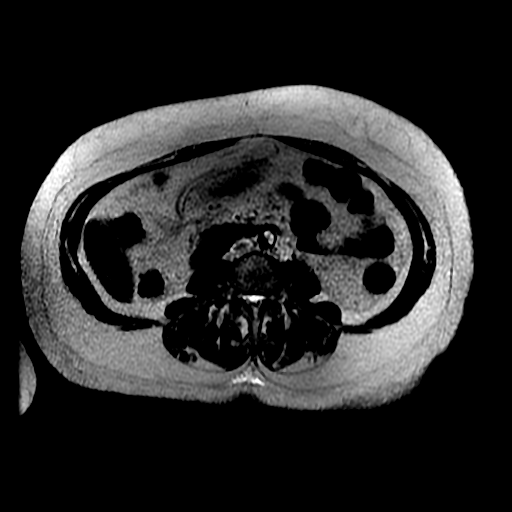
[im 25/50]
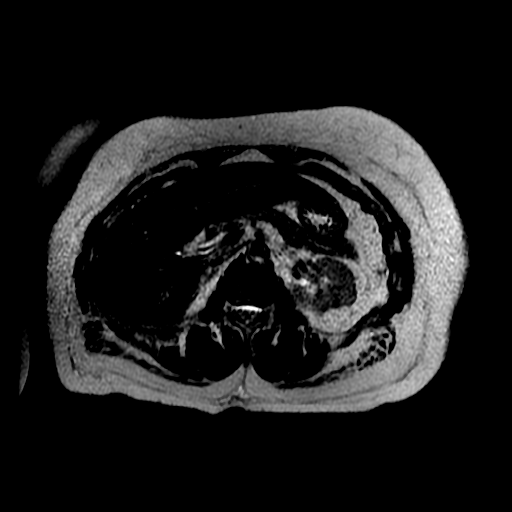
[im 37/50]
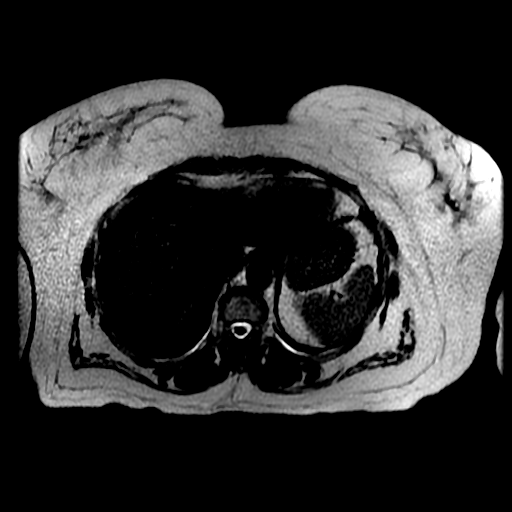
[im 50/50]
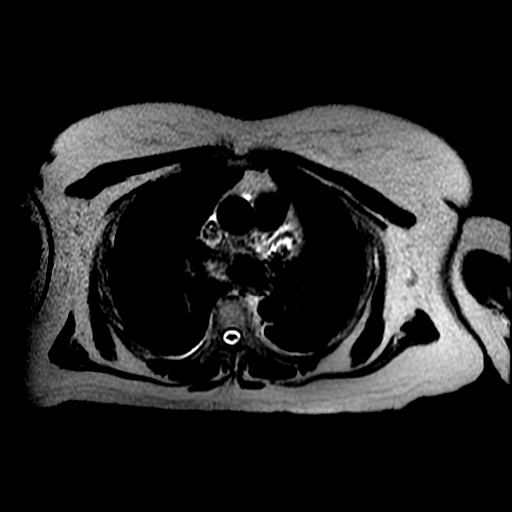

[Series 7: T2 · coronal · 5.0mm · 0.78mm/px · 5 of 42 slices shown (2 of 2)]
[im 1/42]
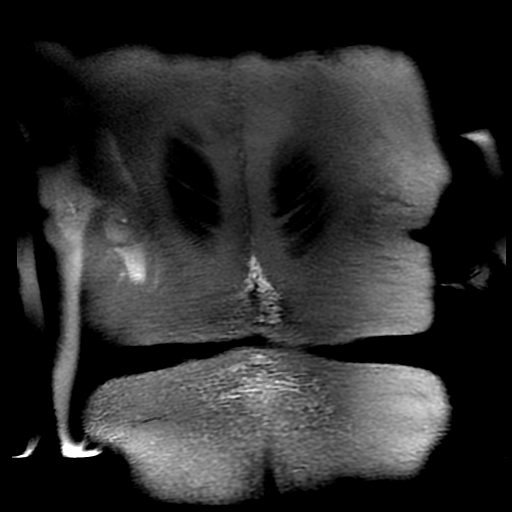
[im 11/42]
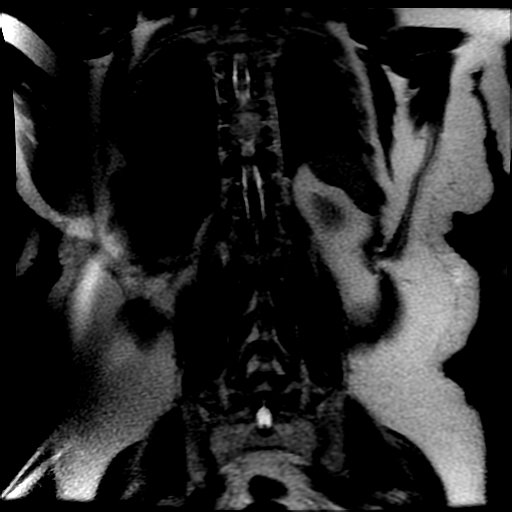
[im 21/42]
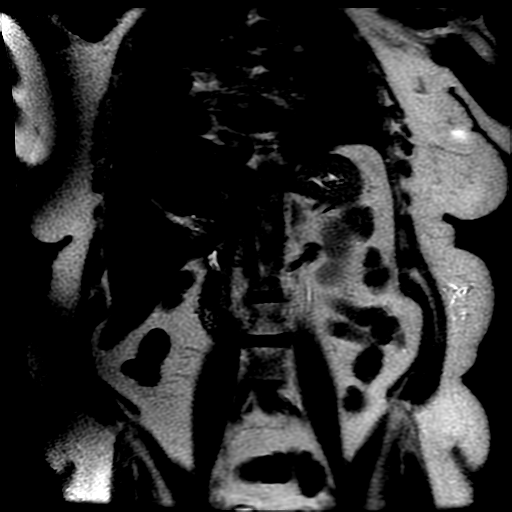
[im 31/42]
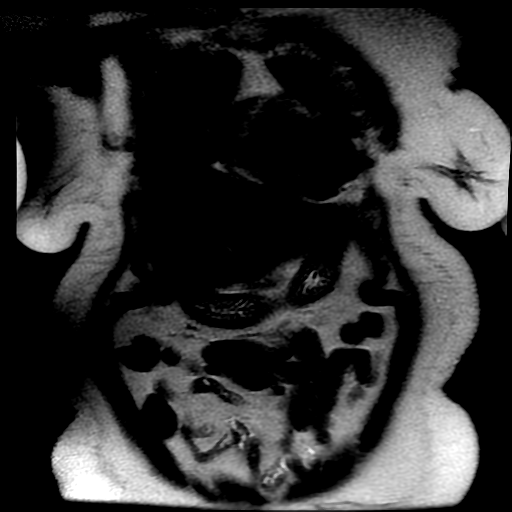
[im 42/42]
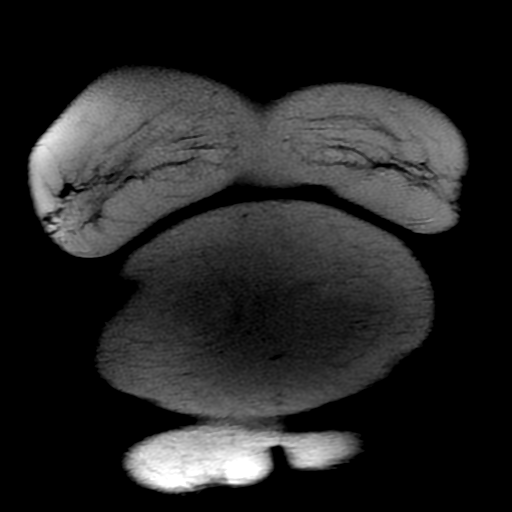

[Series 8: bSSFP · axial · 5.0mm · 0.78mm/px · 1 of 50 slices shown]
[im 1/50]
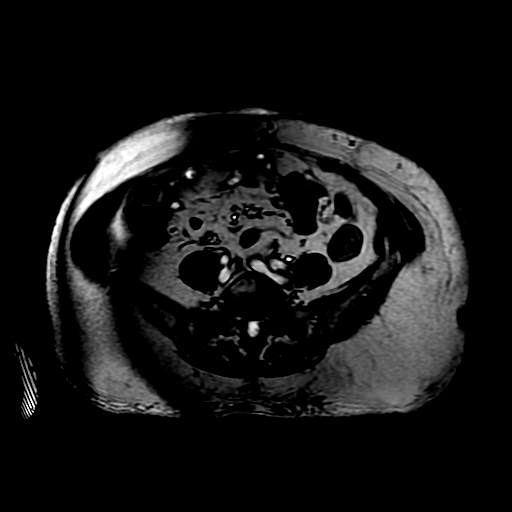

[23 of 48 positions shown; findings below may reference images not displayed]

FINDINGS: Lower chest: No acute abnormality at the lung bases.

Hepatobiliary: Normal liver size and configuration. Mild diffuse
hepatic steatosis. No liver mass. No cholelithiasis. Stable mild
focal adenomyomatosis in the fundal gallbladder wall. No biliary
ductal dilatation. Common bile duct diameter 4 mm. No evidence of
choledocholithiasis. No biliary strictures, masses or beading.

Pancreas: No pancreatic mass or duct dilation.  No pancreas divisum.

Spleen: Normal size. No mass.

Adrenals/Urinary Tract: Normal adrenals. Status post right
nephrectomy. No mass or fluid collection in the right nephrectomy
bed. Stable postsurgical changes from partial nephrectomy in
posterior interpolar left kidney with no recurrent mass in this
location. No left hydronephrosis.

Stomach/Bowel: Normal non-distended stomach. Visualized small and
large bowel is normal caliber, with no bowel wall thickening.

Vascular/Lymphatic: Normal caliber abdominal aorta. No
pathologically enlarged lymph nodes in the abdomen.

Other: No abdominal ascites or focal fluid collection.

Musculoskeletal: No aggressive appearing focal osseous lesions.
IMPRESSION: 1. No evidence of local tumor recurrence status post right
nephrectomy and partial interpolar left nephrectomy.
2. No findings of metastatic disease in the abdomen on this
noncontrast scan.
3. Mild diffuse hepatic steatosis.

## 2021-12-08 IMAGING — CR DG CHEST 2V
2 series · 2 of 2 positions shown · non-contrast
Comparison: 01/08/2018

CLINICAL DATA: History of renal cell carcinoma, follow-up
examination

EXAM:
CHEST - 2 VIEW

[w chest pa]
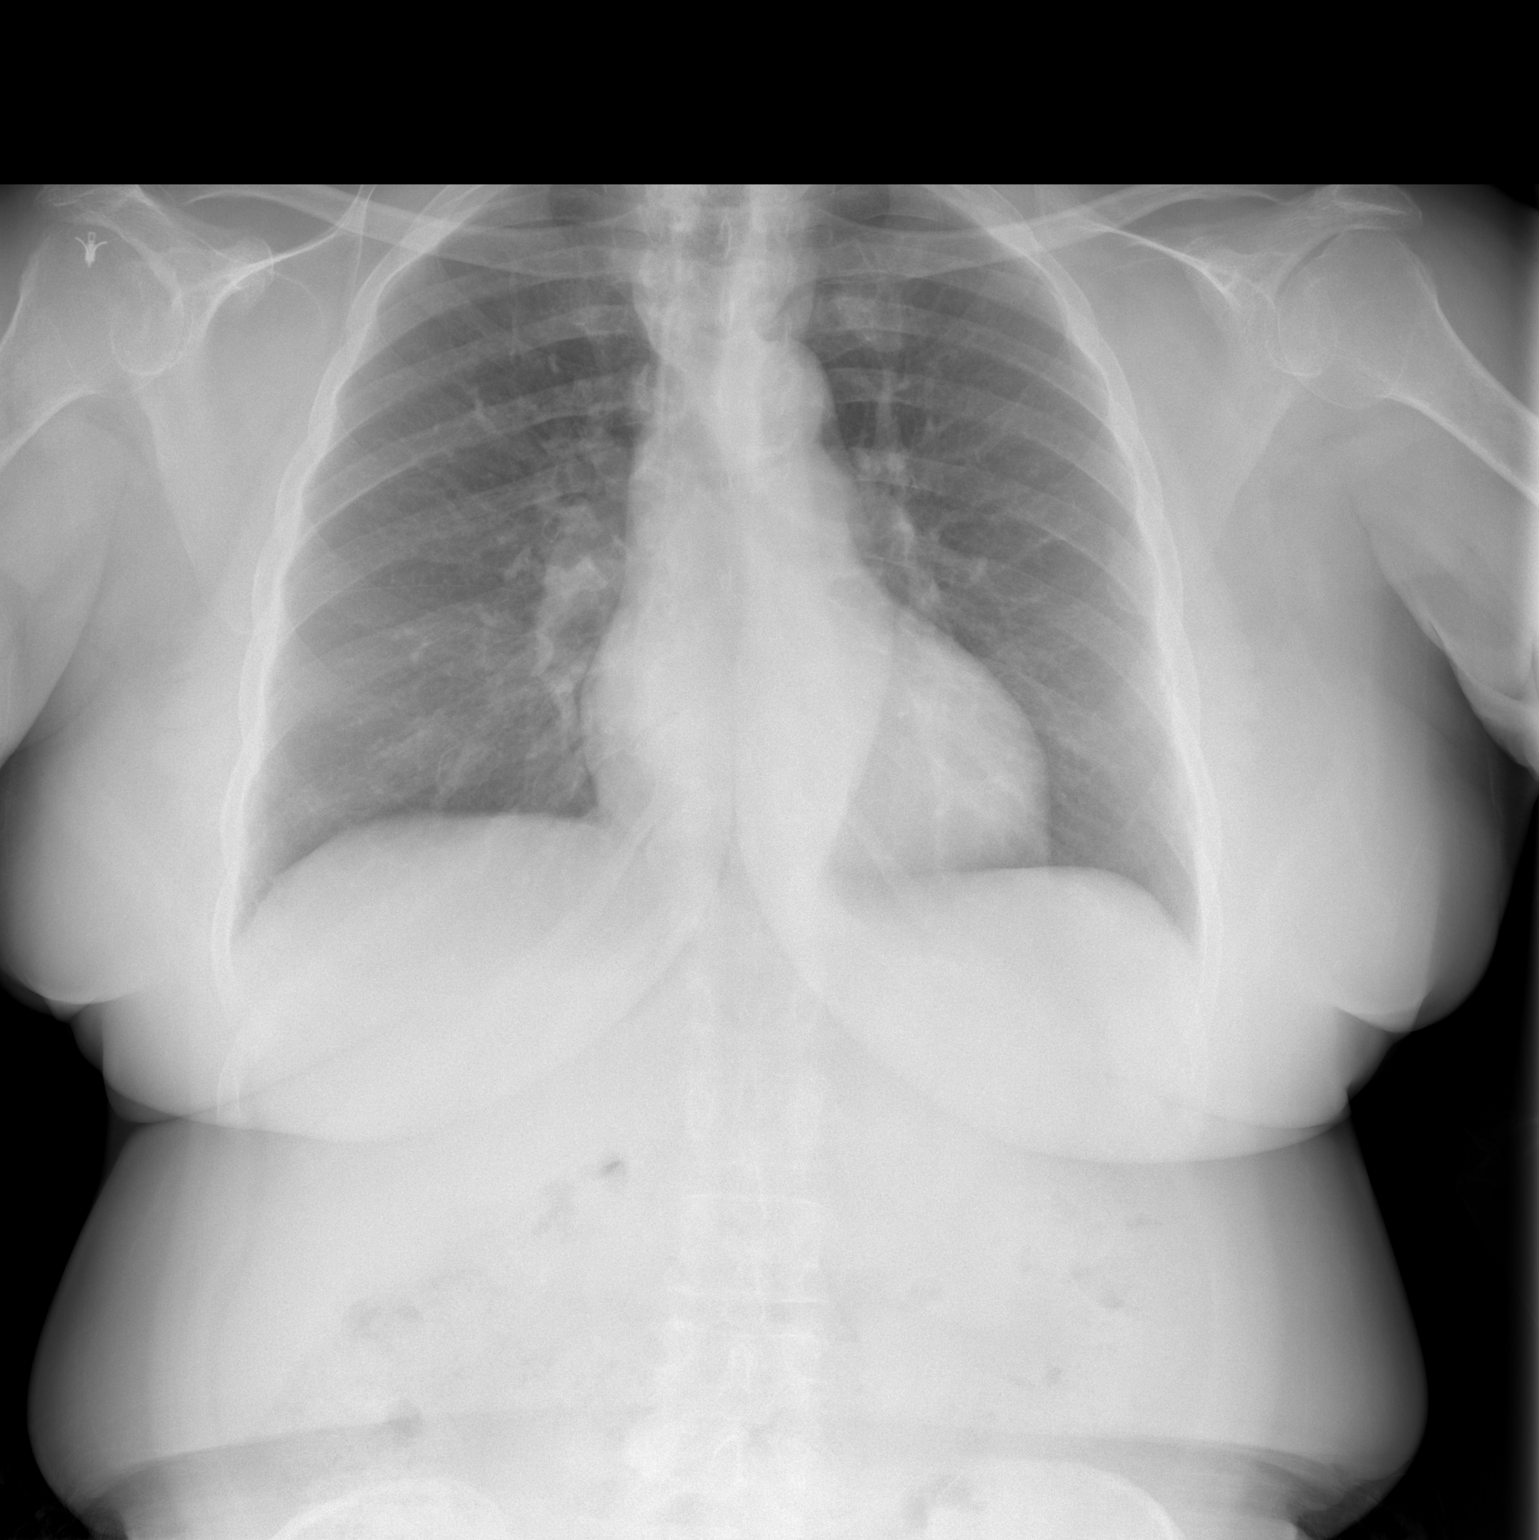

[w chest lat]
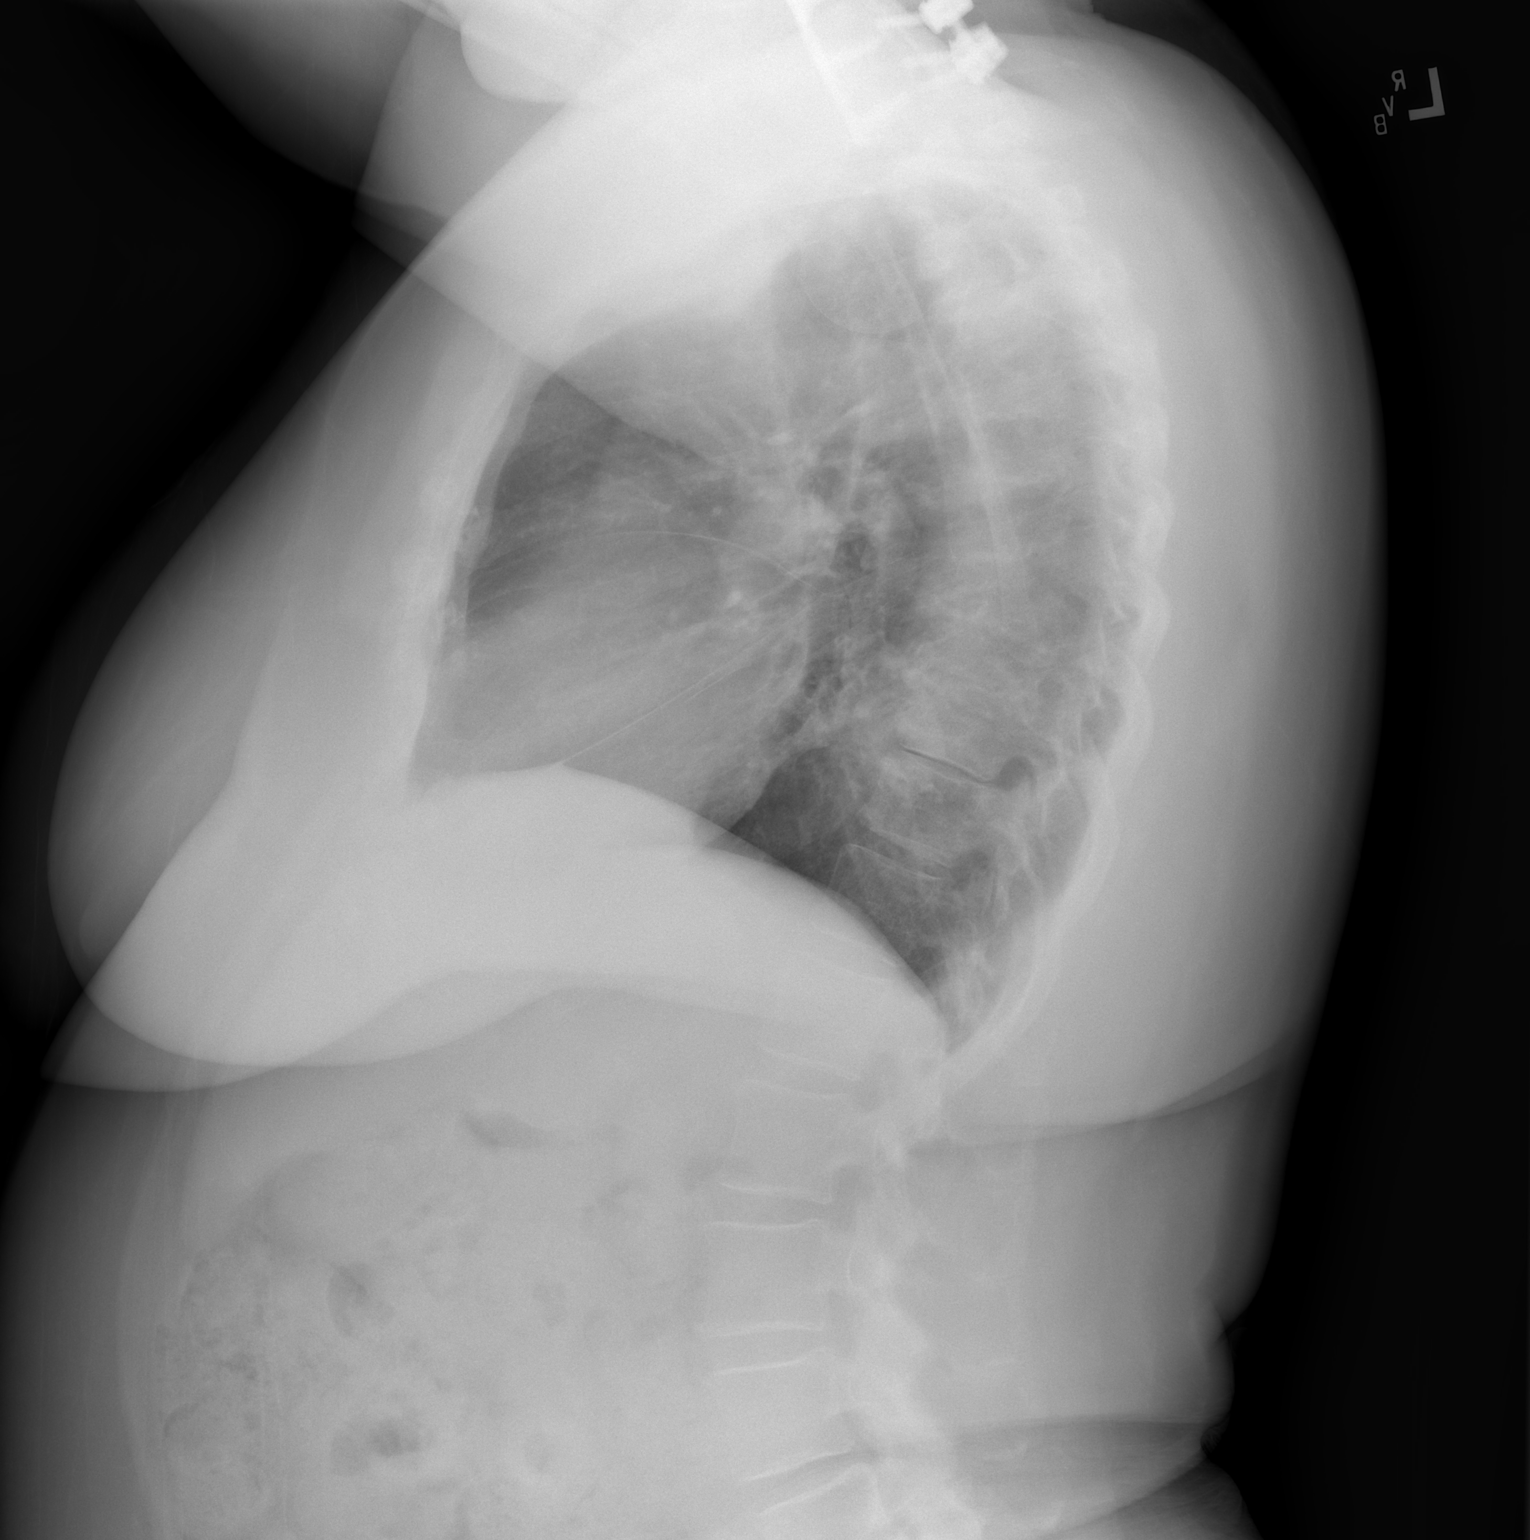

[2 of 2 positions shown; findings below may reference images not displayed]

FINDINGS: The heart size and mediastinal contours are within normal limits.
Both lungs are clear. The visualized skeletal structures are
unremarkable. Postsurgical changes are noted in the cervical spine.
IMPRESSION: No active cardiopulmonary disease.

## 2021-12-12 ENCOUNTER — Encounter (INDEPENDENT_AMBULATORY_CARE_PROVIDER_SITE_OTHER): Payer: Medicare PPO | Admitting: Ophthalmology

## 2021-12-12 DIAGNOSIS — I10 Essential (primary) hypertension: Secondary | ICD-10-CM | POA: Diagnosis not present

## 2021-12-12 DIAGNOSIS — H43813 Vitreous degeneration, bilateral: Secondary | ICD-10-CM | POA: Diagnosis not present

## 2021-12-12 DIAGNOSIS — H35033 Hypertensive retinopathy, bilateral: Secondary | ICD-10-CM | POA: Diagnosis not present

## 2021-12-12 DIAGNOSIS — E113393 Type 2 diabetes mellitus with moderate nonproliferative diabetic retinopathy without macular edema, bilateral: Secondary | ICD-10-CM

## 2021-12-14 NOTE — Chronic Care Management (AMB) (Signed)
  Care Coordination Note  12/14/2021 Name: [name] MRN: [id number] DOB: 04/05/1940  [name] is a 81 y.o. year old female who is a primary care patient of [name], MD and is actively engaged with the care management team. I reached out to [name] by phone today to assist with re-scheduling a follow up visit with the RN Case Manager  Follow up plan: Telephone appointment with care management team member scheduled for: 01/02/2022  Julian Hy, [place] Direct Dial: [phone]

## 2021-12-15 ENCOUNTER — Other Ambulatory Visit: Payer: Self-pay | Admitting: Cardiology

## 2021-12-15 DIAGNOSIS — I10 Essential (primary) hypertension: Secondary | ICD-10-CM

## 2022-01-02 ENCOUNTER — Ambulatory Visit: Payer: Self-pay

## 2022-01-02 NOTE — Patient Instructions (Signed)
Visit Information  Thank you for taking time to visit with me today. Please don't hesitate to contact me if I can be of assistance to you.   Following are the goals we discussed today:   Goals Addressed             This Visit's Progress    "To do better with my diabetes"       Care Coordination Interventions: Evaluation of current treatment plan related to Diabetes Mellitus and patient's adherence to plan as established by provider Advised patient [name] from Dr. Cathey Endow office sent in an Rx for her glucose test strips Confirmed patient uses [facility] on [address], she plans to pick up the glucose test strips today Encouraged patient to resume monitoring her blood sugars as directed by her doctor and report any concerns to her PCP promptly           Our next appointment is by telephone on 03/04/22 at 100 PM   Please call the care guide team at [phone] if you need to cancel or reschedule your appointment.   If you are experiencing a Mental Health or Christian or need someone to talk to, please call 1-800-273-TALK (toll free, 24 hour hotline)  Patient verbalizes understanding of instructions and care plan provided today and agrees to view in [place]. Active MyChart status and patient understanding of how to access instructions and care plan via MyChart confirmed with patient.     Barb Merino, RN, BSN, CCM Care Management Coordinator [facility] Care Management Direct Phone: [phone]

## 2022-01-02 NOTE — Patient Outreach (Signed)
  Care Coordination   Follow Up Visit Note   01/02/2022 Name: CLEVIE PROUT MRN: [id number] DOB: 09-04-1940  HAVEN PYLANT is a 81 y.o. year old female who sees [name], MD for primary care. I spoke with  [name] by phone today.  What matters to the patients health and wellness today?  Patient will fill her glucose test strips today and resume checking her bloods sugars.     Goals Addressed             This Visit's Progress    "To do better with my diabetes"       Care Coordination Interventions: Evaluation of current treatment plan related to Diabetes Mellitus and patient's adherence to plan as established by provider Advised patient [name] from Dr. Cathey Endow office sent in an Rx for her glucose test strips Confirmed patient uses [facility] on [address], she plans to pick up the glucose test strips today Encouraged patient to resume monitoring her blood sugars as directed by her doctor and report any concerns to her PCP promptly           SDOH assessments and interventions completed:  No     Care Coordination Interventions Activated:  Yes  Care Coordination Interventions:  Yes, provided   Follow up plan: Follow up call scheduled for 03/04/22 '@100'$  PM    Encounter Outcome:  Pt. Visit Completed

## 2022-01-03 ENCOUNTER — Other Ambulatory Visit: Payer: Self-pay | Admitting: Cardiology

## 2022-01-03 DIAGNOSIS — I10 Essential (primary) hypertension: Secondary | ICD-10-CM

## 2022-01-09 DIAGNOSIS — N2581 Secondary hyperparathyroidism of renal origin: Secondary | ICD-10-CM | POA: Diagnosis not present

## 2022-01-09 DIAGNOSIS — N189 Chronic kidney disease, unspecified: Secondary | ICD-10-CM | POA: Diagnosis not present

## 2022-01-09 DIAGNOSIS — N184 Chronic kidney disease, stage 4 (severe): Secondary | ICD-10-CM | POA: Diagnosis not present

## 2022-01-09 DIAGNOSIS — E1122 Type 2 diabetes mellitus with diabetic chronic kidney disease: Secondary | ICD-10-CM | POA: Diagnosis not present

## 2022-01-09 DIAGNOSIS — C642 Malignant neoplasm of left kidney, except renal pelvis: Secondary | ICD-10-CM | POA: Diagnosis not present

## 2022-01-09 DIAGNOSIS — I129 Hypertensive chronic kidney disease with stage 1 through stage 4 chronic kidney disease, or unspecified chronic kidney disease: Secondary | ICD-10-CM | POA: Diagnosis not present

## 2022-01-09 DIAGNOSIS — D631 Anemia in chronic kidney disease: Secondary | ICD-10-CM | POA: Diagnosis not present

## 2022-01-09 DIAGNOSIS — C641 Malignant neoplasm of right kidney, except renal pelvis: Secondary | ICD-10-CM | POA: Diagnosis not present

## 2022-01-09 DIAGNOSIS — N390 Urinary tract infection, site not specified: Secondary | ICD-10-CM | POA: Diagnosis not present

## 2022-01-17 DIAGNOSIS — E1122 Type 2 diabetes mellitus with diabetic chronic kidney disease: Secondary | ICD-10-CM | POA: Diagnosis not present

## 2022-01-17 DIAGNOSIS — I129 Hypertensive chronic kidney disease with stage 1 through stage 4 chronic kidney disease, or unspecified chronic kidney disease: Secondary | ICD-10-CM | POA: Diagnosis not present

## 2022-01-17 DIAGNOSIS — N184 Chronic kidney disease, stage 4 (severe): Secondary | ICD-10-CM | POA: Diagnosis not present

## 2022-01-19 ENCOUNTER — Other Ambulatory Visit: Payer: Self-pay | Admitting: Cardiology

## 2022-01-19 DIAGNOSIS — I10 Essential (primary) hypertension: Secondary | ICD-10-CM

## 2022-02-26 DIAGNOSIS — I129 Hypertensive chronic kidney disease with stage 1 through stage 4 chronic kidney disease, or unspecified chronic kidney disease: Secondary | ICD-10-CM | POA: Diagnosis not present

## 2022-02-26 DIAGNOSIS — E1122 Type 2 diabetes mellitus with diabetic chronic kidney disease: Secondary | ICD-10-CM | POA: Diagnosis not present

## 2022-02-26 DIAGNOSIS — N184 Chronic kidney disease, stage 4 (severe): Secondary | ICD-10-CM | POA: Diagnosis not present

## 2022-02-26 DIAGNOSIS — I10 Essential (primary) hypertension: Secondary | ICD-10-CM | POA: Diagnosis not present

## 2022-02-26 DIAGNOSIS — R600 Localized edema: Secondary | ICD-10-CM | POA: Diagnosis not present

## 2022-02-26 DIAGNOSIS — Z6829 Body mass index (BMI) 29.0-29.9, adult: Secondary | ICD-10-CM | POA: Diagnosis not present

## 2022-03-01 ENCOUNTER — Emergency Department (HOSPITAL_COMMUNITY): Payer: Medicare PPO

## 2022-03-01 ENCOUNTER — Encounter (HOSPITAL_COMMUNITY): Payer: Self-pay

## 2022-03-01 ENCOUNTER — Emergency Department (HOSPITAL_COMMUNITY)
Admission: EM | Admit: 2022-03-01 | Discharge: 2022-03-01 | Disposition: A | Discharge status: Home / Self Care | Payer: Medicare PPO | Attending: Student | Admitting: Student

## 2022-03-01 ENCOUNTER — Other Ambulatory Visit: Payer: Self-pay

## 2022-03-01 DIAGNOSIS — E119 Type 2 diabetes mellitus without complications: Secondary | ICD-10-CM | POA: Insufficient documentation

## 2022-03-01 DIAGNOSIS — J45909 Unspecified asthma, uncomplicated: Secondary | ICD-10-CM | POA: Insufficient documentation

## 2022-03-01 DIAGNOSIS — Z1152 Encounter for screening for COVID-19: Secondary | ICD-10-CM | POA: Insufficient documentation

## 2022-03-01 DIAGNOSIS — R079 Chest pain, unspecified: Secondary | ICD-10-CM

## 2022-03-01 DIAGNOSIS — Z794 Long term (current) use of insulin: Secondary | ICD-10-CM | POA: Insufficient documentation

## 2022-03-01 DIAGNOSIS — R0789 Other chest pain: Secondary | ICD-10-CM | POA: Diagnosis not present

## 2022-03-01 DIAGNOSIS — I2510 Atherosclerotic heart disease of native coronary artery without angina pectoris: Secondary | ICD-10-CM | POA: Insufficient documentation

## 2022-03-01 DIAGNOSIS — R519 Headache, unspecified: Secondary | ICD-10-CM | POA: Diagnosis not present

## 2022-03-01 DIAGNOSIS — Z7982 Long term (current) use of aspirin: Secondary | ICD-10-CM | POA: Diagnosis not present

## 2022-03-01 DIAGNOSIS — Z7951 Long term (current) use of inhaled steroids: Secondary | ICD-10-CM | POA: Insufficient documentation

## 2022-03-01 DIAGNOSIS — Z79899 Other long term (current) drug therapy: Secondary | ICD-10-CM | POA: Diagnosis not present

## 2022-03-01 DIAGNOSIS — I10 Essential (primary) hypertension: Secondary | ICD-10-CM | POA: Diagnosis not present

## 2022-03-01 DIAGNOSIS — Z85528 Personal history of other malignant neoplasm of kidney: Secondary | ICD-10-CM | POA: Insufficient documentation

## 2022-03-01 LAB — BASIC METABOLIC PANEL
Anion gap: 8 (ref 5–15)
BUN: 28 mg/dL — ABNORMAL HIGH (ref 8–23)
CO2: 28 mmol/L (ref 22–32)
Calcium: 9.5 mg/dL (ref 8.9–10.3)
Chloride: 104 mmol/L (ref 98–111)
Creatinine, Ser: 1.72 mg/dL — ABNORMAL HIGH (ref 0.44–1.00)
GFR, Estimated: 29 mL/min — ABNORMAL LOW (ref >60)
Glucose, Bld: 150 mg/dL — ABNORMAL HIGH (ref 70–99)
Potassium: 4 mmol/L (ref 3.5–5.1)
Sodium: 140 mmol/L (ref 135–145)

## 2022-03-01 LAB — RESP PANEL BY RT-PCR (RSV, FLU A&B, COVID)  RVPGX2
Influenza A by PCR: NEGATIVE
Influenza B by PCR: NEGATIVE
Resp Syncytial Virus by PCR: NEGATIVE
SARS Coronavirus 2 by RT PCR: NEGATIVE

## 2022-03-01 LAB — CBC
HCT: 38.2 % (ref 36.0–46.0)
Hemoglobin: 12.2 g/dL (ref 12.0–15.0)
MCH: 29.8 pg (ref 26.0–34.0)
MCHC: 31.9 g/dL (ref 30.0–36.0)
MCV: 93.4 fL (ref 80.0–100.0)
Platelets: 221 10*3/uL (ref 150–400)
RBC: 4.09 MIL/uL (ref 3.87–5.11)
RDW: 13.4 % (ref 11.5–15.5)
WBC: 4.7 10*3/uL (ref 4.0–10.5)
nRBC: 0 % (ref 0.0–0.2)

## 2022-03-01 LAB — TROPONIN I (HIGH SENSITIVITY)
Troponin I (High Sensitivity): 4 ng/L (ref <18)
Troponin I (High Sensitivity): 4 ng/L (ref <18)

## 2022-03-01 MED ORDER — ALUM & MAG HYDROXIDE-SIMETH 200-200-20 MG/5ML PO SUSP
30.0000 mL | Freq: Once | ORAL | Status: DC
  Filled 2022-03-01: qty 30

## 2022-03-01 MED ORDER — PROCHLORPERAZINE EDISYLATE 10 MG/2ML IJ SOLN
10.0000 mg | Freq: Once | INTRAMUSCULAR | Status: AC
  Administered 2022-03-01: 10 mg via INTRAVENOUS
  Filled 2022-03-01: qty 2

## 2022-03-01 MED ORDER — LACTATED RINGERS IV BOLUS
1000.0000 mL | Freq: Once | INTRAVENOUS | Status: AC
  Administered 2022-03-01: 1000 mL via INTRAVENOUS

## 2022-03-01 MED ORDER — DIPHENHYDRAMINE HCL 50 MG/ML IJ SOLN
25.0000 mg | Freq: Once | INTRAMUSCULAR | Status: AC
  Administered 2022-03-01: 25 mg via INTRAVENOUS
  Filled 2022-03-01: qty 1

## 2022-03-01 MED ORDER — AMLODIPINE BESYLATE 5 MG PO TABS
10.0000 mg | ORAL_TABLET | Freq: Every day | ORAL | Status: DC
  Administered 2022-03-01: 10 mg via ORAL
  Filled 2022-03-01: qty 2

## 2022-03-01 MED ORDER — METOPROLOL SUCCINATE ER 50 MG PO TB24
100.0000 mg | ORAL_TABLET | Freq: Every day | ORAL | Status: DC
  Administered 2022-03-01: 100 mg via ORAL
  Filled 2022-03-01: qty 2

## 2022-03-01 NOTE — ED Notes (Signed)
Patient is calling her ride.

## 2022-03-01 NOTE — ED Notes (Signed)
Patient is resting.  No s/sx of distress.  She is aware of the plan to monitor for a longer period of time to ensure her heartrate does not drop to an unsafe rate

## 2022-03-01 NOTE — ED Notes (Signed)
Pt was taken to CT

## 2022-03-01 NOTE — ED Provider Notes (Addendum)
[facility] DEPT Provider Note  CSN: [id number] Arrival date & time: 03/01/22 7915  Chief Complaint(s) Chest Pain, Headache, and Abdominal Pain  HPI [name] is a 82 y.o. female with PMH CAD, AKI, T1DM, HTN, HLD who presents emergency department for multiple complaints including headache, abdominal pain and chest pain.  Patient states that symptoms began abruptly at 4 AM this morning and she had some associated chest pressure prompting a call to EMS.  Here in the emergency department, she states the chest pain has improved but states she has a persistent headache.  Denies current chest pain, shortness of breath, nausea, vomiting or other systemic symptoms.  Denies numbness, tingling, weakness or other systemic or neurologic complaints.   Past Medical History Past Medical History:  Diagnosis Date   ACS (acute coronary syndrome) ([place]) 12/27/2010   AKI (acute kidney injury) ([place]) 03/25/2018   Anginal pain ([place])    Arthritis    Asthma    Diabetes mellitus    Type 1   Dysrhythmia    "irregular heart beat"   GERD (gastroesophageal reflux disease)    History of urinary tract infection    Hypercholesteremia    Hypertension    left renal ca dx'd 2012 (?)   surg only, left kidney   Nocturia    Reflux    Renal failure (ARF), acute on chronic (HCC)    Shortness of breath dyspnea    pt denies; states can climb stairs w/o difficulty    Sleep apnea    does not use c-pap machine   Tingling in extremities    legs bilat   Tinnitus    Urinary frequency    Urinary incontinence    Vertigo    occurs when lying flat    Patient Active Problem List   Diagnosis Date Noted   Pain due to onychomycosis of toenails of both feet 09/01/2018   Left shoulder pain    Limited joint range of motion (ROM)    Chest pain 10/23/2015   Diabetes mellitus type 2 in obese ([place]) 10/23/2015   Chest pain with high risk for cardiac etiology 07/28/2014   Diabetes mellitus  12/27/2010   Hypertension 12/27/2010   Hyperlipidemia 12/27/2010   GERD (gastroesophageal reflux disease) 12/27/2010   Renal cell adenocarcinoma ([place]) 12/27/2010   Arthritis 12/27/2010   Cervical stenosis of spine 12/27/2010   Carpal tunnel syndrome 12/27/2010   Asthma 12/27/2010   Home Medication(s) Prior to Admission medications   Medication Sig Start Date End Date Taking? Authorizing Provider  acetaminophen (TYLENOL 8 HOUR) 650 MG CR tablet Take 1 tablet (650 mg total) by mouth every 8 (eight) hours as needed for pain. Patient taking differently: Take 650 mg by mouth in the morning and at bedtime. 02/18/20   Suzy Bouchard, PA-C  albuterol (PROVENTIL,VENTOLIN) 90 MCG/ACT inhaler Inhale 2 puffs into the lungs every 4 (four) hours as needed for wheezing or shortness of breath.     [provider]  amLODipine (NORVASC) 5 MG tablet TAKE 1 TABLET BY MOUTH EVERY DAY Patient taking differently: Take 10 mg by mouth daily. 06/28/19   Patwardhan, Reynold Bowen, MD  aspirin EC 81 MG tablet Take 81 mg by mouth at bedtime.    [provider]  atorvastatin (LIPITOR) 10 MG tablet Take 10 mg by mouth every morning.    [provider]  cholecalciferol (VITAMIN D) 1000 units tablet Take 1,000 Units by mouth daily.    [provider]  donepezil (ARICEPT) 10 MG tablet Take 1 tablet (10 mg total) by mouth at bedtime. Must be seen for further refills. Call [phone]. 12/27/20   Ward Givens, NP  fluticasone (FLONASE) 50 MCG/ACT nasal spray Place 1 spray into both nostrils daily. Patient taking differently: Place 1 spray into both nostrils daily as needed for allergies. 08/27/20   Volanda Napoleon, PA-C  furosemide (LASIX) 20 MG tablet Take 10 mg by mouth daily. 07/25/21   [provider]  gabapentin (NEURONTIN) 300 MG capsule Take 1 capsule (300 mg total) by mouth at bedtime. Patient taking differently: Take 300 mg by mouth 2 (two) times daily. 07/16/16   Wallene Huh, DPM  insulin degludec (TRESIBA) 200 UNIT/ML FlexTouch Pen Inject 80 Units into the skin daily before breakfast.    [provider]  metoprolol succinate (TOPROL-XL) 100 MG 24 hr tablet TAKE 1 TABLET BY MOUTH EVERY DAY 01/21/22   [name], MD  mirabegron ER (MYRBETRIQ) 50 MG TB24 tablet Take 50 mg by mouth daily. 06/18/21   [provider]  Multiple Vitamins-Minerals (CENTRUM SILVER ADULT 50+ PO) Take 1 tablet by mouth daily.    [provider]  pantoprazole (PROTONIX) 40 MG tablet Take 1 tablet (40 mg total) by mouth daily. 08/29/21   Horton, Barbette Hair, MD                                                                                                                                    Past Surgical History Past Surgical History:  Procedure Laterality Date   ABDOMINAL HYSTERECTOMY     APPENDECTOMY     CARDIAC CATHETERIZATION N/A 07/28/2014   Procedure: Left Heart Cath and Coronary Angiography;  Surgeon: [name], MD;  Location: [facility];  Service: Cardiovascular;  Laterality: N/A;   CYSTOSCOPY WITH RETROGRADE PYELOGRAM, URETEROSCOPY AND STENT PLACEMENT Right 02/09/2015   Procedure: CYSTOSCOPY WITH RETROGRADE PYELOGRAM AND URETEROSCOPY ;  Surgeon: Raynelle Bring, MD;  Location: [facility];  Service: Urology;  Laterality: Right;   LAPAROSCOPIC NEPHRECTOMY Right 03/20/2015   Procedure: LAPAROSCOPIC NEPHRECTOMY;  Surgeon: Raynelle Bring, MD;  Location: [facility];  Service: Urology;  Laterality: Right;   PERIPHERAL VASCULAR CATHETERIZATION N/A 07/28/2014   Procedure: Aortic Arch Angiography;  Surgeon: [name], MD;  Location: [facility];  Service: Cardiovascular;  Laterality: N/A;   RENAL MASS EXCISION     SPINE SURGERY     Family History Family History  Problem Relation Age of Onset   Hypertension Mother    Coronary artery disease Other    Breast cancer Cousin     Social History Social History   Tobacco Use   Smoking status: Never   Smokeless  tobacco: Never  Vaping Use   Vaping Use: Never used  Substance Use Topics   Alcohol use: No   Drug use: No   Allergies Sulfa antibiotics  Review of Systems Review  of Systems  Cardiovascular:  Positive for chest pain.  Gastrointestinal:  Positive for abdominal pain and nausea.  Neurological:  Positive for headaches.    Physical Exam Vital Signs  I have reviewed the triage vital signs BP (!) 226/78   Pulse (!) 54   Temp 98.3 F (36.8 C) (Oral)   Resp 12   Ht '4\' 11"'$  (1.499 m)   Wt 67.1 kg   SpO2 98%   BMI 29.89 kg/m   Physical Exam Vitals and nursing note reviewed.  Constitutional:      General: She is not in acute distress.    Appearance: She is well-developed.  HENT:     Head: Normocephalic and atraumatic.  Eyes:     Conjunctiva/sclera: Conjunctivae normal.  Cardiovascular:     Rate and Rhythm: Normal rate and regular rhythm.     Heart sounds: No murmur heard. Pulmonary:     Effort: Pulmonary effort is normal. No respiratory distress.     Breath sounds: Normal breath sounds.  Abdominal:     Palpations: Abdomen is soft.     Tenderness: There is no abdominal tenderness.  Musculoskeletal:        General: No swelling.     Cervical back: Neck supple.  Skin:    General: Skin is warm and dry.     Capillary Refill: Capillary refill takes less than 2 seconds.  Neurological:     Mental Status: She is alert.     Cranial Nerves: No cranial nerve deficit.     Motor: No weakness.  Psychiatric:        Mood and Affect: Mood normal.     ED Results and Treatments Labs (all labs ordered are listed, but only abnormal results are displayed) Labs Reviewed  BASIC METABOLIC PANEL - Abnormal; Notable for the following components:      Result Value   Glucose, Bld 150 (*)    BUN 28 (*)    Creatinine, Ser 1.72 (*)    GFR, Estimated 29 (*)    All other components within normal limits  RESP PANEL BY RT-PCR (RSV, FLU A&B, COVID)  RVPGX2  CBC  TROPONIN I (HIGH SENSITIVITY)   TROPONIN I (HIGH SENSITIVITY)                                                                                                                          Radiology CT Head Wo Contrast  Result Date: 03/01/2022 CLINICAL DATA:  Headache. EXAM: CT HEAD WITHOUT CONTRAST TECHNIQUE: Contiguous axial images were obtained from the base of the skull through the vertex without intravenous contrast. RADIATION DOSE REDUCTION: This exam was performed according to the departmental dose-optimization program which includes automated exposure control, adjustment of the mA and/or kV according to patient size and/or use of iterative reconstruction technique. COMPARISON:  MRI brain and CT head dated Jun 18, 2021. FINDINGS: Brain: No evidence of acute infarction, hemorrhage, hydrocephalus, extra-axial collection or mass lesion/mass effect. Stable mild atrophy and  chronic microvascular ischemic changes. Vascular: Atherosclerotic vascular calcification of the carotid siphons. No hyperdense vessel. Skull: Normal. Negative for fracture or focal lesion. Sinuses/Orbits: No acute finding. Other: None. IMPRESSION: 1. No acute intracranial abnormality. Stable mild atrophy and chronic microvascular ischemic changes. Electronically Signed   By: Titus Dubin M.D.   On: 03/01/2022 09:08   DG Chest 2 View  Result Date: 03/01/2022 CLINICAL DATA:  82 year old female with chest pain and headache. EXAM: CHEST - 2 VIEW COMPARISON:  Portable chest 09/01/2021 and earlier. FINDINGS: AP and lateral views at 0722 hours. Anterior and posterior cervical spine fusion hardware. Lung volumes and mediastinal contours are stable since last year and within normal limits. No pneumothorax, pulmonary edema, pleural effusion or confluent pulmonary opacity. Visualized tracheal air column is within normal limits. Chronic postoperative changes to the right shoulder also. No acute osseous abnormality identified. Negative visible bowel gas. IMPRESSION: No acute  cardiopulmonary abnormality. Electronically Signed   By: Genevie Ann M.D.   On: 03/01/2022 07:37    Pertinent labs & imaging results that were available during my care of the patient were reviewed by me and considered in my medical decision making (see MDM for details).  Medications Ordered in ED Medications  amLODipine (NORVASC) tablet 10 mg (10 mg Oral Given 03/01/22 0933)  metoprolol succinate (TOPROL-XL) 24 hr tablet 100 mg (100 mg Oral Given 03/01/22 0935)  prochlorperazine (COMPAZINE) injection 10 mg (10 mg Intravenous Given 03/01/22 0858)  diphenhydrAMINE (BENADRYL) injection 25 mg (25 mg Intravenous Given 03/01/22 0858)  lactated ringers bolus 1,000 mL (0 mLs Intravenous Stopped 03/01/22 1055)                                                                                                                                     Procedures Procedures  (including critical care time)  Medical Decision Making / ED Course   This patient presents to the ED for concern of headache, abdominal pain and chest pain, this involves an extensive number of treatment options, and is a complaint that carries with it a high risk of complications and morbidity.  The differential diagnosis includes viral URI, migraine headache, ACS, pneumonia, COVID-19, influenza, dehydration, hypertensive urgency, hypertensive emergency  MDM: Patient seen emergency room for evaluation of multiple complaints described above.  Physical exam is unremarkable with no focal motor or sensory deficits, cardiopulmonary exam unremarkable.  Laboratory evaluation with a BUN of 28, creatinine 1.72 which is this patient's baseline.  High-sensitivity troponin troponin unremarkable.  COVID and flu negative.  Chest x-ray unremarkable.  CT head unremarkable.  Patient was given headache cocktail and on reevaluation her symptoms of improved significantly.  Patient is low risk by Wells criteria and I have very low suspicion for PE as the source of her  underlying intermittent chest pain.  Patient has a heart score of 4 and as she has no exertional symptoms, chest pain resolved with headache cocktail,  no diaphoresis or shortness of breath, I have overall lower suspicion for ACS but patient would benefit from outpatient cardiology follow-up of which I placed a referral.  She was given return precautions of which she voiced understanding.  Patient remained persistently hypertensive here in the ER and I did restart her home medications with minimal improvement of her blood pressure.  Current emergency department literature shows worsening outcomes for aggressive asymptomatic blood pressure control in the ER and the patient will need outpatient PCP follow-up for adjustment of her blood pressure medications.  On reevaluation, patient continues to be chest pain-free at discharge and she was discharged with outpatient follow-up.  Update: As the patient was attempting to be discharged, she accidentally took an additional dose of her home metoprolol not realizing that we gave her some here.  Given that the patient already is bradycardic at baseline, I did have concern about possible beta-blocker toxicity and the patient was observed for an additional hour and a half to ensure that she does not have worsening bradycardia.  Patient's heart rate was largely unchanged but her blood pressure did significant improve.  Strict return precautions given which she voiced understanding.   Additional history obtained:  -External records from outside source obtained and reviewed including: Chart review including previous notes, labs, imaging, consultation notes   Lab Tests: -I ordered, reviewed, and interpreted labs.   The pertinent results include:   Labs Reviewed  BASIC METABOLIC PANEL - Abnormal; Notable for the following components:      Result Value   Glucose, Bld 150 (*)    BUN 28 (*)    Creatinine, Ser 1.72 (*)    GFR, Estimated 29 (*)    All other components  within normal limits  RESP PANEL BY RT-PCR (RSV, FLU A&B, COVID)  RVPGX2  CBC  TROPONIN I (HIGH SENSITIVITY)  TROPONIN I (HIGH SENSITIVITY)      EKG   EKG Interpretation  Date/Time:  Friday March 01 2022 06:49:17 EST Ventricular Rate:  55 PR Interval:  227 QRS Duration: 84 QT Interval:  451 QTC Calculation: 432 R Axis:   50 Text Interpretation: Sinus rhythm Borderline prolonged PR interval Low voltage, precordial leads Anteroseptal infarct, old Minimal ST elevation, inferior leads similar to previous Confirmed by Wynona Dove ([id number]) on 03/01/2022 6:51:42 AM         Imaging Studies ordered: I ordered imaging studies including chest x-ray, CTH I independently visualized and interpreted imaging. I agree with the radiologist interpretation   Medicines ordered and prescription drug management: Meds ordered this encounter  Medications   DISCONTD: alum & mag hydroxide-simeth (MAALOX/MYLANTA) 200-200-20 MG/5ML suspension 30 mL   prochlorperazine (COMPAZINE) injection 10 mg   diphenhydrAMINE (BENADRYL) injection 25 mg   lactated ringers bolus 1,000 mL   amLODipine (NORVASC) tablet 10 mg   metoprolol succinate (TOPROL-XL) 24 hr tablet 100 mg    -I have reviewed the patients home medicines and have made adjustments as needed  Critical interventions none   Cardiac Monitoring: The patient was maintained on a cardiac monitor.  I personally viewed and interpreted the cardiac monitored which showed an underlying rhythm of: Sinus bradycardia  Social Determinants of Health:  Factors impacting patients care include: none   Reevaluation: After the interventions noted above, I reevaluated the patient and found that they have :improved  Co morbidities that complicate the patient evaluation  Past Medical History:  Diagnosis Date   ACS (acute coronary syndrome) ([place]) 12/27/2010   AKI (acute kidney injury) (  [place]) 03/25/2018   Anginal pain ([place])    Arthritis    Asthma    Diabetes  mellitus    Type 1   Dysrhythmia    "irregular heart beat"   GERD (gastroesophageal reflux disease)    History of urinary tract infection    Hypercholesteremia    Hypertension    left renal ca dx'd 2012 (?)   surg only, left kidney   Nocturia    Reflux    Renal failure (ARF), acute on chronic (HCC)    Shortness of breath dyspnea    pt denies; states can climb stairs w/o difficulty    Sleep apnea    does not use c-pap machine   Tingling in extremities    legs bilat   Tinnitus    Urinary frequency    Urinary incontinence    Vertigo    occurs when lying flat       Dispostion: I considered admission for this patient, but she currently does not meet inpatient criteria for admission and with a heart score of 4 she is safe for discharge with close cardiology follow-up of which I placed a referral.     Final Clinical Impression(s) / ED Diagnoses Final diagnoses:  Acute nonintractable headache, unspecified headache type  Chest pain, unspecified type     '@PCDICTATION'$ @    Teressa Lower, MD 03/01/22 [place], [place], MD 03/01/22 1436

## 2022-03-01 NOTE — ED Notes (Signed)
Patient continues to resting with no complaints.

## 2022-03-01 NOTE — ED Notes (Signed)
Patient is resting comfortably. 

## 2022-03-01 NOTE — ED Notes (Signed)
Patient is alert and oriented.  She denies any complaints other than dizziness that has been happening for 3 days.  No other neuro deficit noted.  She denies pain.  Patient up to bathroom with assistance due to dizziness

## 2022-03-01 NOTE — ED Notes (Signed)
Patient is alert.  No s/sx of distress.  HR remains in the 50's at rest.

## 2022-03-01 NOTE — ED Notes (Signed)
Patient is resting.  No complaints at this time.  Patient remains on cardiac monitoring

## 2022-03-01 NOTE — ED Provider Triage Note (Signed)
Emergency Medicine Provider Triage Evaluation Note  [name] , a 82 y.o. female  was evaluated in triage.  Pt complains of chest pain, abdominal pain, headache.  Patient poor historian.  Patient reports that all the symptoms began this morning around 3 AM she was try to go to sleep.  Patient denies any fevers, nausea, vomiting, diarrhea, dysuria, shortness of breath.  The patient denies any sick contacts.  Patient states that she has been told her blood pressure has been elevated during her last doctor visit, blood pressure is elevated here today.  The patient denies blurred vision, shortness of breath, does endorse headache and chest pain.  Patient also with history of GERD.  Will order chest pain labs, respiratory panel, GI cocktail.  Review of Systems  Positive:  Negative:   Physical Exam  BP (!) 207/88 (BP Location: Left Arm)   Pulse (!) 55   Temp 97.9 F (36.6 C) (Oral)   Resp 10   Ht '4\' 11"'$  (1.499 m)   Wt 67.1 kg   SpO2 98%   BMI 29.89 kg/m  Gen:   Awake, no distress   Resp:  Normal effort  MSK:   Moves extremities without difficulty  Other:    Medical Decision Making  Medically screening exam initiated at 7:12 AM.  Appropriate orders placed.  [name] was informed that the remainder of the evaluation will be completed by another provider, this initial triage assessment does not replace that evaluation, and the importance of remaining in the ED until their evaluation is complete.     Azucena Cecil, PA-C 03/01/22 [phone]

## 2022-03-01 NOTE — ED Notes (Signed)
Patient verbalized understanding of discharge instructions and reasons to return to the ED for near syncope, chest pain, shortness of breath, or feeling that her heart rate is lower.  She understands not to take another dose of metoprolol and novasc today.

## 2022-03-01 NOTE — ED Triage Notes (Signed)
Pt states that she is having sharp chest pain and a headache since this morning. Pt states that her stomach feels uncomfortable.

## 2022-03-01 NOTE — ED Notes (Signed)
Patient had her morning medications with her.  She was not aware of what was in the box and took them due to not having had her medications yet this morning.  She was not aware that she had taken amlodipine and metoprolol already this morning in the ED and that these were in her box.  MD notified of the same.  He is contacting pharmacy for further directions

## 2022-03-05 ENCOUNTER — Telehealth: Payer: Self-pay | Admitting: *Deleted

## 2022-03-05 ENCOUNTER — Ambulatory Visit: Payer: Medicare PPO | Admitting: Cardiology

## 2022-03-05 NOTE — Progress Notes (Signed)
  Care Coordination Note  03/05/2022 Name: ARNESHA SCHIRALDI MRN: [id number] DOB: May 30, 1940  EZELLA KELL is a 82 y.o. year old female who is a primary care patient of [name], MD and is actively engaged with the care management team. I reached out to [name] by phone today to assist with canceling a follow up visit with the RN Case Manager  Follow up plan: Unsuccessful telephone outreach attempt made.   [place]  Direct Dial: [phone]

## 2022-03-06 NOTE — Progress Notes (Signed)
  Care Coordination Note  03/06/2022 Name: [name] MRN: [id number] DOB: December 03, 1940  [name] is a 82 y.o. year old female who is a primary care patient of [name], MD and is actively engaged with the care management team. I reached out to [name] by phone today to assist with canceling a follow up visit with the RN Case Manager.   Follow up plan: The patient has been provided with information for canceling her upcoming call with the care management team and has been advised to call with any health related questions or concerns to her PCP office.   [place]  Direct Dial: [phone]

## 2022-03-07 DIAGNOSIS — R0789 Other chest pain: Secondary | ICD-10-CM | POA: Diagnosis not present

## 2022-03-07 DIAGNOSIS — E1169 Type 2 diabetes mellitus with other specified complication: Secondary | ICD-10-CM | POA: Diagnosis not present

## 2022-03-07 DIAGNOSIS — N184 Chronic kidney disease, stage 4 (severe): Secondary | ICD-10-CM | POA: Diagnosis not present

## 2022-03-07 DIAGNOSIS — Z794 Long term (current) use of insulin: Secondary | ICD-10-CM | POA: Diagnosis not present

## 2022-03-07 DIAGNOSIS — E1122 Type 2 diabetes mellitus with diabetic chronic kidney disease: Secondary | ICD-10-CM | POA: Diagnosis not present

## 2022-03-07 DIAGNOSIS — I129 Hypertensive chronic kidney disease with stage 1 through stage 4 chronic kidney disease, or unspecified chronic kidney disease: Secondary | ICD-10-CM | POA: Diagnosis not present

## 2022-03-16 ENCOUNTER — Other Ambulatory Visit: Payer: Self-pay | Admitting: Cardiology

## 2022-03-16 DIAGNOSIS — I10 Essential (primary) hypertension: Secondary | ICD-10-CM

## 2022-03-21 DIAGNOSIS — I10 Essential (primary) hypertension: Secondary | ICD-10-CM | POA: Diagnosis not present

## 2022-03-21 DIAGNOSIS — N390 Urinary tract infection, site not specified: Secondary | ICD-10-CM | POA: Diagnosis not present

## 2022-03-21 DIAGNOSIS — E1165 Type 2 diabetes mellitus with hyperglycemia: Secondary | ICD-10-CM | POA: Diagnosis not present

## 2022-03-21 DIAGNOSIS — M184 Other bilateral secondary osteoarthritis of first carpometacarpal joints: Secondary | ICD-10-CM | POA: Diagnosis not present

## 2022-03-29 ENCOUNTER — Other Ambulatory Visit: Payer: Self-pay

## 2022-03-29 ENCOUNTER — Emergency Department (HOSPITAL_BASED_OUTPATIENT_CLINIC_OR_DEPARTMENT_OTHER): Payer: Medicare PPO | Admitting: Radiology

## 2022-03-29 ENCOUNTER — Emergency Department (HOSPITAL_BASED_OUTPATIENT_CLINIC_OR_DEPARTMENT_OTHER)
Admission: EM | Admit: 2022-03-29 | Discharge: 2022-03-29 | Disposition: A | Discharge status: Home / Self Care | Payer: Medicare PPO | Attending: Emergency Medicine | Admitting: Emergency Medicine

## 2022-03-29 DIAGNOSIS — W19XXXA Unspecified fall, initial encounter: Secondary | ICD-10-CM | POA: Diagnosis not present

## 2022-03-29 DIAGNOSIS — Y92830 Public park as the place of occurrence of the external cause: Secondary | ICD-10-CM | POA: Insufficient documentation

## 2022-03-29 DIAGNOSIS — Z794 Long term (current) use of insulin: Secondary | ICD-10-CM | POA: Diagnosis not present

## 2022-03-29 DIAGNOSIS — Z7982 Long term (current) use of aspirin: Secondary | ICD-10-CM | POA: Diagnosis not present

## 2022-03-29 DIAGNOSIS — S20212A Contusion of left front wall of thorax, initial encounter: Secondary | ICD-10-CM | POA: Insufficient documentation

## 2022-03-29 DIAGNOSIS — Z79899 Other long term (current) drug therapy: Secondary | ICD-10-CM | POA: Insufficient documentation

## 2022-03-29 DIAGNOSIS — S8991XA Unspecified injury of right lower leg, initial encounter: Secondary | ICD-10-CM | POA: Diagnosis present

## 2022-03-29 DIAGNOSIS — Y9301 Activity, walking, marching and hiking: Secondary | ICD-10-CM | POA: Diagnosis not present

## 2022-03-29 DIAGNOSIS — S8001XA Contusion of right knee, initial encounter: Secondary | ICD-10-CM | POA: Diagnosis not present

## 2022-03-29 DIAGNOSIS — R0781 Pleurodynia: Secondary | ICD-10-CM | POA: Diagnosis not present

## 2022-03-29 DIAGNOSIS — M25561 Pain in right knee: Secondary | ICD-10-CM | POA: Diagnosis not present

## 2022-03-29 DIAGNOSIS — S20211A Contusion of right front wall of thorax, initial encounter: Secondary | ICD-10-CM | POA: Diagnosis not present

## 2022-03-29 MED ORDER — LIDOCAINE 5 % EX PTCH
1.0000 | MEDICATED_PATCH | CUTANEOUS | Status: DC
  Administered 2022-03-29: 1 via TRANSDERMAL
  Filled 2022-03-29: qty 1

## 2022-03-29 MED ORDER — LIDOCAINE 5 % EX PTCH: 1.0000 | MEDICATED_PATCH | CUTANEOUS | 0 refills | Status: DC

## 2022-03-29 MED ORDER — CYCLOBENZAPRINE HCL 5 MG PO TABS: 5.0000 mg | ORAL_TABLET | Freq: Two times a day (BID) | ORAL | 0 refills | Status: DC | PRN

## 2022-03-29 NOTE — ED Provider Notes (Signed)
[facility] Provider Note   CSN: [id number] Arrival date & time: 03/29/22  1038     History  Chief Complaint  Patient presents with   Lytle Michaels    [name] is a 82 y.o. female.  Fall yesterday, pain to the right knee and left rib area.  Not hit her head or lose consciousness.  She not on blood thinners.  She has been ambulatory but with pain mostly in her left lower ribs and right knee.  No major medical problems otherwise.  Denies any other extremity pain or back pain.  Tylenol has helped.  The history is provided by the patient.       Home Medications Prior to Admission medications   Medication Sig Start Date End Date Taking? Authorizing Provider  cyclobenzaprine (FLEXERIL) 5 MG tablet Take 1 tablet (5 mg total) by mouth 2 (two) times daily as needed for up to 20 doses for muscle spasms. 03/29/22  Yes Lanny Lipkin, DO  lidocaine (LIDODERM) 5 % Place 1 patch onto the skin daily. Remove & Discard patch within 12 hours or as directed by MD 03/29/22  Yes Bentzion Dauria, DO  acetaminophen (TYLENOL 8 HOUR) 650 MG CR tablet Take 1 tablet (650 mg total) by mouth every 8 (eight) hours as needed for pain. Patient taking differently: Take 650 mg by mouth in the morning and at bedtime. 02/18/20   Suzy Bouchard, PA-C  albuterol (PROVENTIL,VENTOLIN) 90 MCG/ACT inhaler Inhale 2 puffs into the lungs every 4 (four) hours as needed for wheezing or shortness of breath.     [provider]  amLODipine (NORVASC) 5 MG tablet TAKE 1 TABLET BY MOUTH EVERY DAY Patient taking differently: Take 10 mg by mouth daily. 06/28/19   Patwardhan, Reynold Bowen, MD  aspirin EC 81 MG tablet Take 81 mg by mouth at bedtime.    [provider]  atorvastatin (LIPITOR) 10 MG tablet Take 10 mg by mouth every morning.    [provider]  cholecalciferol (VITAMIN D) 1000 units tablet Take 1,000 Units by mouth daily.    [provider]  donepezil  (ARICEPT) 10 MG tablet Take 1 tablet (10 mg total) by mouth at bedtime. Must be seen for further refills. Call [phone]. 12/27/20   Ward Givens, NP  fluticasone (FLONASE) 50 MCG/ACT nasal spray Place 1 spray into both nostrils daily. Patient taking differently: Place 1 spray into both nostrils daily as needed for allergies. 08/27/20   Volanda Napoleon, PA-C  furosemide (LASIX) 20 MG tablet Take 10 mg by mouth daily. 07/25/21   [provider]  gabapentin (NEURONTIN) 300 MG capsule Take 1 capsule (300 mg total) by mouth at bedtime. Patient taking differently: Take 300 mg by mouth 2 (two) times daily. 07/16/16   Wallene Huh, DPM  insulin degludec (TRESIBA) 200 UNIT/ML FlexTouch Pen Inject 80 Units into the skin daily before breakfast.    [provider]  metoprolol succinate (TOPROL-XL) 100 MG 24 hr tablet TAKE 1 TABLET BY MOUTH EVERY DAY 01/21/22   [name], MD  mirabegron ER (MYRBETRIQ) 50 MG TB24 tablet Take 50 mg by mouth daily. 06/18/21   [provider]  Multiple Vitamins-Minerals (CENTRUM SILVER ADULT 50+ PO) Take 1 tablet by mouth daily.    [provider]  pantoprazole (PROTONIX) 40 MG tablet Take 1 tablet (40 mg total) by mouth daily. 08/29/21   Horton, Barbette Hair, MD      Allergies  Sulfa antibiotics    Review of Systems   Review of Systems  Physical Exam Updated Vital Signs BP (!) 162/73 (BP Location: Right Arm)   Pulse 64   Temp 98.6 F (37 C) (Oral)   Resp 18   Ht 4' 11"$  (1.499 m)   Wt 67.1 kg   SpO2 100%   BMI 29.89 kg/m  Physical Exam Vitals and nursing note reviewed.  Constitutional:      General: She is not in acute distress.    Appearance: She is well-developed. She is not ill-appearing.  HENT:     Head: Normocephalic and atraumatic.     Nose: Nose normal.     Mouth/Throat:     Mouth: Mucous membranes are moist.  Eyes:     Extraocular Movements: Extraocular movements intact.     Conjunctiva/sclera: Conjunctivae  normal.     Pupils: Pupils are equal, round, and reactive to light.  Cardiovascular:     Rate and Rhythm: Normal rate and regular rhythm.     Heart sounds: No murmur heard. Pulmonary:     Effort: Pulmonary effort is normal. No respiratory distress.     Breath sounds: Normal breath sounds.  Abdominal:     Palpations: Abdomen is soft.     Tenderness: There is no abdominal tenderness.  Musculoskeletal:        General: Tenderness present. No swelling. Normal range of motion.     Cervical back: Normal range of motion and neck supple. No tenderness.     Comments: No midline spinal pain.  Tenderness to the right knee, left side of the ribs  Skin:    General: Skin is warm and dry.     Capillary Refill: Capillary refill takes less than 2 seconds.  Neurological:     General: No focal deficit present.     Mental Status: She is alert.     Cranial Nerves: No cranial nerve deficit.     Sensory: No sensory deficit.     Motor: No weakness.     Coordination: Coordination normal.  Psychiatric:        Mood and Affect: Mood normal.     ED Results / Procedures / Treatments   Labs (all labs ordered are listed, but only abnormal results are displayed) Labs Reviewed - No data to display  EKG None  Radiology DG Ribs Unilateral W/Chest Left  Result Date: 03/29/2022 CLINICAL DATA:  Fall yesterday.  Anterior left rib pain. EXAM: LEFT RIBS AND CHEST - 3+ VIEW COMPARISON:  Chest radiographs 03/01/2022 FINDINGS: The cardiac silhouette is accentuated by AP technique. No airspace consolidation, edema, sizable pleural effusion, or pneumothorax is identified. No rib fracture is identified. Prior anterior and posterior cervical spine fusion is noted. IMPRESSION: No rib fracture identified. Electronically Signed   By: Logan Bores M.D.   On: 03/29/2022 11:45   DG Knee Complete 4 Views Right  Result Date: 03/29/2022 CLINICAL DATA:  Status post fall.  Anterior ribbon right knee pain. EXAM: RIGHT KNEE - COMPLETE  4+ VIEW COMPARISON:  None Available. FINDINGS: The mineralization and alignment are normal. There is no evidence of acute fracture or dislocation. Mild medial compartment joint space narrowing and osteophyte formation. No evidence of joint effusion or foreign body. IMPRESSION: No acute osseous findings. Mild medial compartment osteoarthritis. Electronically Signed   By: Richardean Sale M.D.   On: 03/29/2022 11:45    Procedures Procedures    Medications Ordered in ED Medications  lidocaine (LIDODERM) 5 % 1 patch (  1 patch Transdermal Patch Applied 03/29/22 1146)    ED Course/ Medical Decision Making/ A&P                             Medical Decision Making Amount and/or Complexity of Data Reviewed Radiology: ordered.  Risk Prescription drug management.   SYBILLA MELL is here with left rib pain, right knee pain after fall yesterday.  No midline spinal pain.  Neurologically she is intact.  She not on blood thinners.  No significant comorbidities.  Differential diagnosis is contusion versus fracture.  There is no obvious laxity of the right knee joint.  X-ray of the right knee per my review interpretation shows no acute fracture or malalignment.  X-ray of the chest shows no fracture or pneumothorax per my review and interpretation.  Radiology report with the same.  Will prescribe lidocaine patch.  Recommend Tylenol, ice.  She was put in the Ace wrap for the right knee.  Will have her follow-up with orthopedics.  Discharged in good condition.  No headache or neck pain no loss of consciousness.  No need for head or neck imaging.  This chart was dictated using voice recognition software.  Despite best efforts to proofread,  errors can occur which can change the documentation meaning.          Final Clinical Impression(s) / ED Diagnoses Final diagnoses:  Contusion of right knee, initial encounter  Rib contusion, left, initial encounter    Rx / DC Orders ED Discharge Orders           Ordered    lidocaine (LIDODERM) 5 %  Every 24 hours        03/29/22 1149    cyclobenzaprine (FLEXERIL) 5 MG tablet  2 times daily PRN        03/29/22 1149              Sherrilynn Gudgel, DO 03/29/22 1150

## 2022-03-29 NOTE — Discharge Instructions (Signed)
X-rays did not show any fractures.  Overall suspect you have a bruise to your ribs and your knee.  Activity as tolerated.  Use lidocaine patches as prescribed.  Take Flexeril as prescribed, this medication is sedating so do not mix with alcohol or drugs or dangerous activities including driving.  Continue Tylenol and ice.  Follow-up with primary care doctor.

## 2022-03-29 NOTE — ED Notes (Signed)
Patient transported to X-ray 

## 2022-03-29 NOTE — ED Triage Notes (Signed)
Pt arrived POV, caox4. Pt reports mechanical fall while walking in the park yesterday morning falling forward. Pt c/o R knee pain and R rib area. Pt also has bruising to the palms of the hands. Pt denies hitting her head, denies LOC, denies taking blood thinner medication.

## 2022-04-10 ENCOUNTER — Encounter: Payer: Self-pay | Admitting: Cardiology

## 2022-04-10 ENCOUNTER — Ambulatory Visit: Payer: Medicare PPO | Attending: Cardiology | Admitting: Cardiology

## 2022-04-10 VITALS — BP 160/70 | HR 68 | Ht 59.0 in | Wt 150.4 lb

## 2022-04-10 DIAGNOSIS — R079 Chest pain, unspecified: Secondary | ICD-10-CM

## 2022-04-10 DIAGNOSIS — R072 Precordial pain: Secondary | ICD-10-CM | POA: Diagnosis not present

## 2022-04-10 DIAGNOSIS — I10 Essential (primary) hypertension: Secondary | ICD-10-CM | POA: Diagnosis not present

## 2022-04-10 MED ORDER — AMLODIPINE BESYLATE 10 MG PO TABS: 10.0000 mg | ORAL_TABLET | Freq: Every day | ORAL | 3 refills | Status: DC

## 2022-04-10 NOTE — Patient Instructions (Signed)
Medication Instructions:  Please STOP taking amlodipine (Norvasc) 5 mg daily. Please START taking 10 mg amlodipine (Norvasc) daily. Dr. Radford Pax has increased your dose.   *If you need a refill on your cardiac medications before your next appointment, please call your pharmacy*   Lab Work: None.  If you have labs (blood work) drawn today and your tests are completely normal, you will receive your results only by: [facility] (if you have MyChart) OR A paper copy in the mail If you have any lab test that is abnormal or we need to change your treatment, we will call you to review the results.   Testing/Procedures: Your physician has requested that you have an echocardiogram. Echocardiography is a painless test that uses sound waves to create images of your heart. It provides your doctor with information about the size and shape of your heart and how well your heart's chambers and valves are working. This procedure takes approximately one hour. There are no restrictions for this procedure. Please do NOT wear cologne, perfume, aftershave, or lotions (deodorant is allowed). Please arrive 15 minutes prior to your appointment time.  How to Prepare for Your Cardiac PET/CT Stress Test:  1. Please do not take these medications before your test:   Medications that may interfere with the cardiac pharmacological stress agent (ex. nitrates - including erectile dysfunction medications, isosorbide mononitrate, tamulosin or beta-blockers) the day of the exam. (Erectile dysfunction medication should be held for at least 72 hrs prior to test) Theophylline containing medications for 12 hours. Dipyridamole 48 hours prior to the test. Your remaining medications may be taken with water.  2. Nothing to eat or drink, except water, 3 hours prior to arrival time.   NO caffeine/decaffeinated products, or chocolate 12 hours prior to arrival.  3. NO perfume, cologne or lotion  4. Total time is 1 to 2 hours;  you may want to bring reading material for the waiting time.  5. Please report to Radiology at the [facility] Main Entrance 30 minutes early for your test.  [place], [place] [address]  Diabetic Preparation:  Hold oral medications. You may take NPH and Lantus insulin. Do not take Humalog or Humulin R (Regular Insulin) the day of your test. Check blood sugars prior to leaving the house. If able to eat breakfast prior to 3 hour fasting, you may take all medications, including your insulin, Do not worry if you miss your breakfast dose of insulin - start at your next meal.  IF YOU THINK YOU MAY BE PREGNANT, OR ARE NURSING PLEASE INFORM THE TECHNOLOGIST.  In preparation for your appointment, medication and supplies will be purchased.  Appointment availability is limited, so if you need to cancel or reschedule, please call the Radiology Department at [phone]  24 hours in advance to avoid a cancellation fee of $100.00  What to Expect After you Arrive:  Once you arrive and check in for your appointment, you will be taken to a preparation room within the Radiology Department.  A technologist or Nurse will obtain your medical history, verify that you are correctly prepped for the exam, and explain the procedure.  Afterwards,  an IV will be started in your arm and electrodes will be placed on your skin for EKG monitoring during the stress portion of the exam. Then you will be escorted to the PET/CT scanner.  There, staff will get you positioned on the scanner and obtain a blood pressure and EKG.  During  the exam, you will continue to be connected to the EKG and blood pressure machines.  A small, safe amount of a radioactive tracer will be injected in your IV to obtain a series of pictures of your heart along with an injection of a stress agent.    After your Exam:  It is recommended that you eat a meal and drink a caffeinated beverage to counter act any effects of the  stress agent.  Drink plenty of fluids for the remainder of the day and urinate frequently for the first couple of hours after the exam.  Your doctor will inform you of your test results within 7-10 business days.  For questions about your test or how to prepare for your test, please call: Marchia Bond, Cardiac Imaging Nurse Navigator  Gordy Clement, Cardiac Imaging Nurse Navigator Office: [phone]    Follow-Up: At [facility], you and your health needs are our priority.  As part of our continuing mission to provide you with exceptional heart care, we have created designated Provider Care Teams.  These Care Teams include your primary Cardiologist (physician) and Advanced Practice Providers (APPs -  Physician Assistants and Nurse Practitioners) who all work together to provide you with the care you need, when you need it.  We recommend signing up for the patient portal called "MyChart".  Sign up information is provided on this After Visit Summary.  MyChart is used to connect with patients for Virtual Visits (Telemedicine).  Patients are able to view lab/test results, encounter notes, upcoming appointments, etc.  Non-urgent messages can be sent to your provider as well.   To learn more about what you can do with MyChart, go to [email or web address].    Your next appointment:   6 month(s)  Provider:   Dr. Fransico Him, MD   Other Instructions You have been referred to the hypertension clinic.

## 2022-04-10 NOTE — Addendum Note (Signed)
Addended by: Joni Reining on: 04/10/2022 02:17 PM   Modules accepted: Orders

## 2022-04-10 NOTE — Progress Notes (Signed)
Cardiology CONSULT Note    Date:  04/10/2022   ID:  [name], DOB 08-Jun-1940, MRN [id number]  PCP:  [name], MD  Cardiologist:  Fransico Him, MD   Chief Complaint  Patient presents with   New Patient (Initial Visit)    Chest pain    History of Present Illness:  [name] is a 82 y.o. female who is being seen today for the evaluation of CAD at the request of [name], MD.  This is an 82yo AAF with a hx of hypertension, type 2 DM, hyperlipidemia, prior normal coronary angiograms 2012 and 2016, h/o nephrectomy, CKD III-IV, GERD, mild dementia. She was in the ER in Jan 2024 for abdominal and chest pain along with HA.   Her sx had come on suddenly and called EMS.  In the ER her CP resolved.  BP was markedly elevated at 226/47mHg.  Head CT was normal and Cxray showed NAD. hsTrop was normal EKG showed sinus brady with no acute ST changes.   She is now referred by the ER for further evaluation of her CP.  Her last cath was in 2016 showing normal coronary arteries with slow filling of the RCA and there was some concern for microvascular disease.  She is on amlodipine and BB. Since her ER visit she has not had any further CP.  She has chronic DOE that occurs with talking.  She has had some LE edema in the past noted by her PCP.  She is on diuretics.  She denies any dizziness or syncope.   Past Medical History:  Diagnosis Date   ACS (acute coronary syndrome) ([facility] 12/27/2010   AKI (acute kidney injury) ([facility] 03/25/2018   Anginal pain ([facility]    Arthritis    Asthma    Diabetes mellitus    Type 1   Dysrhythmia    "irregular heart beat"   GERD (gastroesophageal reflux disease)    History of urinary tract infection    Hypercholesteremia    Hypertension    left renal ca dx'd 2012 (?)   surg only, left kidney   Nocturia    Reflux    Renal failure (ARF), acute on chronic (HCC)    Shortness of breath dyspnea    pt denies; states can climb stairs w/o difficulty    Sleep  apnea    does not use c-pap machine   Tingling in extremities    legs bilat   Tinnitus    Urinary frequency    Urinary incontinence    Vertigo    occurs when lying flat     Past Surgical History:  Procedure Laterality Date   ABDOMINAL HYSTERECTOMY     APPENDECTOMY     CARDIAC CATHETERIZATION N/A 07/28/2014   Procedure: Left Heart Cath and Coronary Angiography;  Surgeon: JAdrian Prows MD;  [facility];  Service: Cardiovascular;  Laterality: N/A;   CYSTOSCOPY WITH RETROGRADE PYELOGRAM, URETEROSCOPY AND STENT PLACEMENT Right 02/09/2015   Procedure: CYSTOSCOPY WITH RETROGRADE PYELOGRAM AND URETEROSCOPY ;  Surgeon: LRaynelle Bring MD;  Location: [facility];  Service: Urology;  Laterality: Right;   LAPAROSCOPIC NEPHRECTOMY Right 03/20/2015   Procedure: LAPAROSCOPIC NEPHRECTOMY;  Surgeon: LRaynelle Bring MD;  Location: [facility];  Service: Urology;  Laterality: Right;   PERIPHERAL VASCULAR CATHETERIZATION N/A 07/28/2014   Procedure: Aortic Arch Angiography;  Surgeon: JAdrian Prows MD;  Location[facility];  Service: Cardiovascular;  Laterality: N/A;   RENAL MASS EXCISION  SPINE SURGERY      Current Medications: Current Meds  Medication Sig   acetaminophen (TYLENOL 8 HOUR) 650 MG CR tablet Take 1 tablet (650 mg total) by mouth every 8 (eight) hours as needed for pain. (Patient taking differently: Take 650 mg by mouth in the morning and at bedtime.)   albuterol (PROVENTIL,VENTOLIN) 90 MCG/ACT inhaler Inhale 2 puffs into the lungs every 4 (four) hours as needed for wheezing or shortness of breath.    amLODipine (NORVASC) 5 MG tablet TAKE 1 TABLET BY MOUTH EVERY DAY (Patient taking differently: Take 5 mg by mouth daily.)   aspirin EC 81 MG tablet Take 81 mg by mouth at bedtime.   atorvastatin (LIPITOR) 10 MG tablet Take 10 mg by mouth every morning.   cholecalciferol (VITAMIN D) 1000 units tablet Take 1,000 Units by mouth daily.   donepezil (ARICEPT) 10 MG tablet Take 1 tablet (10  mg total) by mouth at bedtime. Must be seen for further refills. Call [phone].   gabapentin (NEURONTIN) 300 MG capsule Take 1 capsule (300 mg total) by mouth at bedtime. (Patient taking differently: Take 300 mg by mouth 2 (two) times daily.)   insulin degludec (TRESIBA) 200 UNIT/ML FlexTouch Pen Inject 80 Units into the skin daily before breakfast.   metoprolol succinate (TOPROL-XL) 100 MG 24 hr tablet TAKE 1 TABLET BY MOUTH EVERY DAY   mirabegron ER (MYRBETRIQ) 50 MG TB24 tablet Take 50 mg by mouth daily.   Multiple Vitamins-Minerals (CENTRUM SILVER ADULT 50+ PO) Take 1 tablet by mouth daily.   pantoprazole (PROTONIX) 40 MG tablet Take 1 tablet (40 mg total) by mouth daily.    Allergies:   Sulfa antibiotics   Social History   Socioeconomic History   Marital status: Widowed    Spouse name: Not on file   Number of children: 2   Years of education: Not on file   Highest education level: Not on file  Occupational History   Not on file  Tobacco Use   Smoking status: Never   Smokeless tobacco: Never  Vaping Use   Vaping Use: Never used  Substance and Sexual Activity   Alcohol use: No   Drug use: No   Sexual activity: Never  Other Topics Concern   Not on file  Social History Narrative   Not on file   Social Determinants of Health   Financial Resource Strain: Not on file  Food Insecurity: No Food Insecurity (09/19/2021)   Hunger Vital Sign    Worried About Running Out of Food in the Last Year: Never true    Ran Out of Food in the Last Year: Never true  Transportation Needs: No Transportation Needs (09/19/2021)   PRAPARE - Hydrologist (Medical): No    Lack of Transportation (Non-Medical): No  Physical Activity: Not on file  Stress: Not on file  Social Connections: Not on file     Family History:  The patient's family history includes Breast cancer in her cousin; Coronary artery disease in an other family member; Hypertension in her mother.    ROS:   Please see the history of present illness.    ROS All other systems reviewed and are negative.      No data to display             PHYSICAL EXAM:   VS:  BP (!) 160/70 (BP Location: Left Arm, Patient Position: Sitting, Cuff Size: Normal)   Pulse 68   Ht 4'  11" (1.499 m)   Wt 150 lb 6.4 oz (68.2 kg)   BMI 30.38 kg/m    GEN: Well nourished, well developed, in no acute distress  HEENT: normal  Neck: no JVD, carotid bruits, or masses Cardiac: RRR; no murmurs, rubs, or gallops,no edema.  Intact distal pulses bilaterally.  Respiratory:  clear to auscultation bilaterally, normal work of breathing GI: soft, nontender, nondistended, + BS MS: no deformity or atrophy  Skin: warm and dry, no rash Neuro:  Alert and Oriented x 3, Strength and sensation are intact Psych: euthymic mood, full affect  Wt Readings from Last 3 Encounters:  04/10/22 150 lb 6.4 oz (68.2 kg)  03/29/22 148 lb (67.1 kg)  03/01/22 148 lb (67.1 kg)      Studies/Labs Reviewed:   EKG:  EKG is not ordered today.   Recent Labs: 09/01/2021: ALT 24; TSH 1.059 03/01/2022: BUN 28; Creatinine, Ser 1.72; Hemoglobin 12.2; Platelets 221; Potassium 4.0; Sodium 140   Lipid Panel    Component Value Date/Time   CHOL 137 12/28/2010 0010   TRIG 97 12/28/2010 0010   HDL 43 12/28/2010 0010   CHOLHDL 3.2 12/28/2010 0010   VLDL 19 12/28/2010 0010   LDLCALC 75 12/28/2010 0010   Additional studies/ records that were reviewed today include:  none    ASSESSMENT:    1. Precordial pain   2. Primary hypertension      PLAN:  In order of problems listed above:  HTN -BP poorly controlled on exam today -continue prescription drug management with Toprol-XL 100 mg daily with as needed refills -increase Amlodipine to 40m daily  -Pharm D HTN clinic in 2 weeks  2.  Chest pain -she has had 2 normal caths in the past with last being 2016 with ? Of possible endothelial dysfunction -will get a stress PET CT to  rule out ischemia and assess coronary flow reserve -Shared Decision Making/Informed Consent The risks [chest pain, shortness of breath, cardiac arrhythmias, dizziness, blood pressure fluctuations, myocardial infarction, stroke/transient ischemic attack, nausea, vomiting, allergic reaction, radiation exposure, metallic taste sensation and life-threatening complications (estimated to be 1 in 10,000)], benefits (risk stratification, diagnosing coronary artery disease, treatment guidance) and alternatives of a cardiac PET stress test were discussed in detail with Ms. Jurgensen and she agrees to proceed. -check 2D echo  Time Spent: 20 minutes total time of encounter, including 15 minutes spent in face-to-face patient care on the date of this encounter. This time includes coordination of care and counseling regarding above mentioned problem list. Remainder of non-face-to-face time involved reviewing chart documents/testing relevant to the patient encounter and documentation in the medical record. I have independently reviewed documentation from referring provider  Medication Adjustments/Labs and Tests Ordered: Current medicines are reviewed at length with the patient today.  Concerns regarding medicines are outlined above.  Medication changes, Labs and Tests ordered today are listed in the Patient Instructions below.  There are no Patient Instructions on file for this visit.   Signed, TFransico Him MD  04/10/2022 2:04 PM    [facility] [address] [place]  [id number]: [phone] Fax: [phone]

## 2022-04-11 DIAGNOSIS — S8001XA Contusion of right knee, initial encounter: Secondary | ICD-10-CM | POA: Diagnosis not present

## 2022-04-11 DIAGNOSIS — S80211A Abrasion, right knee, initial encounter: Secondary | ICD-10-CM | POA: Diagnosis not present

## 2022-04-11 DIAGNOSIS — W1839XA Other fall on same level, initial encounter: Secondary | ICD-10-CM | POA: Diagnosis not present

## 2022-04-11 DIAGNOSIS — E1165 Type 2 diabetes mellitus with hyperglycemia: Secondary | ICD-10-CM | POA: Diagnosis not present

## 2022-04-11 DIAGNOSIS — S20212A Contusion of left front wall of thorax, initial encounter: Secondary | ICD-10-CM | POA: Diagnosis not present

## 2022-04-11 DIAGNOSIS — S60221A Contusion of right hand, initial encounter: Secondary | ICD-10-CM | POA: Diagnosis not present

## 2022-04-18 DIAGNOSIS — I129 Hypertensive chronic kidney disease with stage 1 through stage 4 chronic kidney disease, or unspecified chronic kidney disease: Secondary | ICD-10-CM | POA: Diagnosis not present

## 2022-04-18 DIAGNOSIS — N184 Chronic kidney disease, stage 4 (severe): Secondary | ICD-10-CM | POA: Diagnosis not present

## 2022-04-18 DIAGNOSIS — Z794 Long term (current) use of insulin: Secondary | ICD-10-CM | POA: Diagnosis not present

## 2022-04-18 DIAGNOSIS — E1122 Type 2 diabetes mellitus with diabetic chronic kidney disease: Secondary | ICD-10-CM | POA: Diagnosis not present

## 2022-04-19 DIAGNOSIS — Z85528 Personal history of other malignant neoplasm of kidney: Secondary | ICD-10-CM | POA: Diagnosis not present

## 2022-04-19 DIAGNOSIS — C641 Malignant neoplasm of right kidney, except renal pelvis: Secondary | ICD-10-CM | POA: Diagnosis not present

## 2022-04-19 DIAGNOSIS — N3946 Mixed incontinence: Secondary | ICD-10-CM | POA: Diagnosis not present

## 2022-04-22 IMAGING — MG DIGITAL SCREENING BILAT W/ TOMO W/ CAD
6 of 10 series · 6 of 30 positions shown · non-contrast
Comparison: Previous exam(s).

CLINICAL DATA: Screening.

EXAM:
DIGITAL SCREENING BILATERAL MAMMOGRAM WITH TOMO AND CAD

[L CC synth-2D]
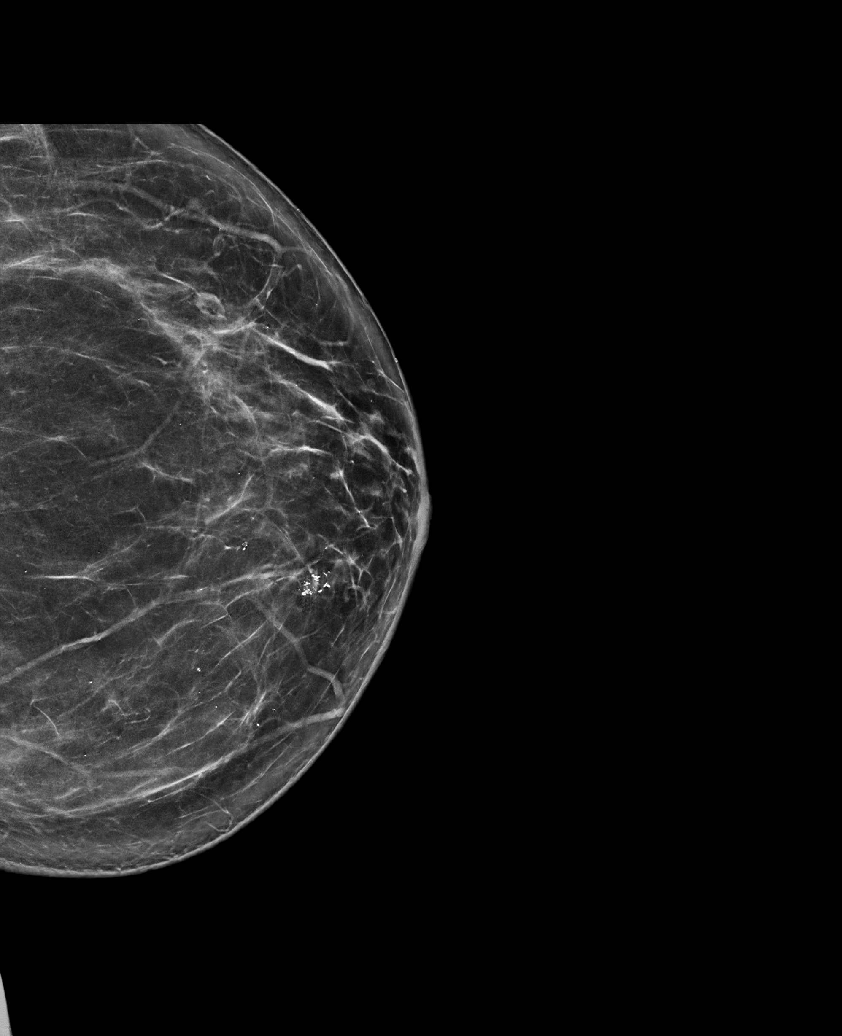

[R MLO synth-2D (1 of 2)]
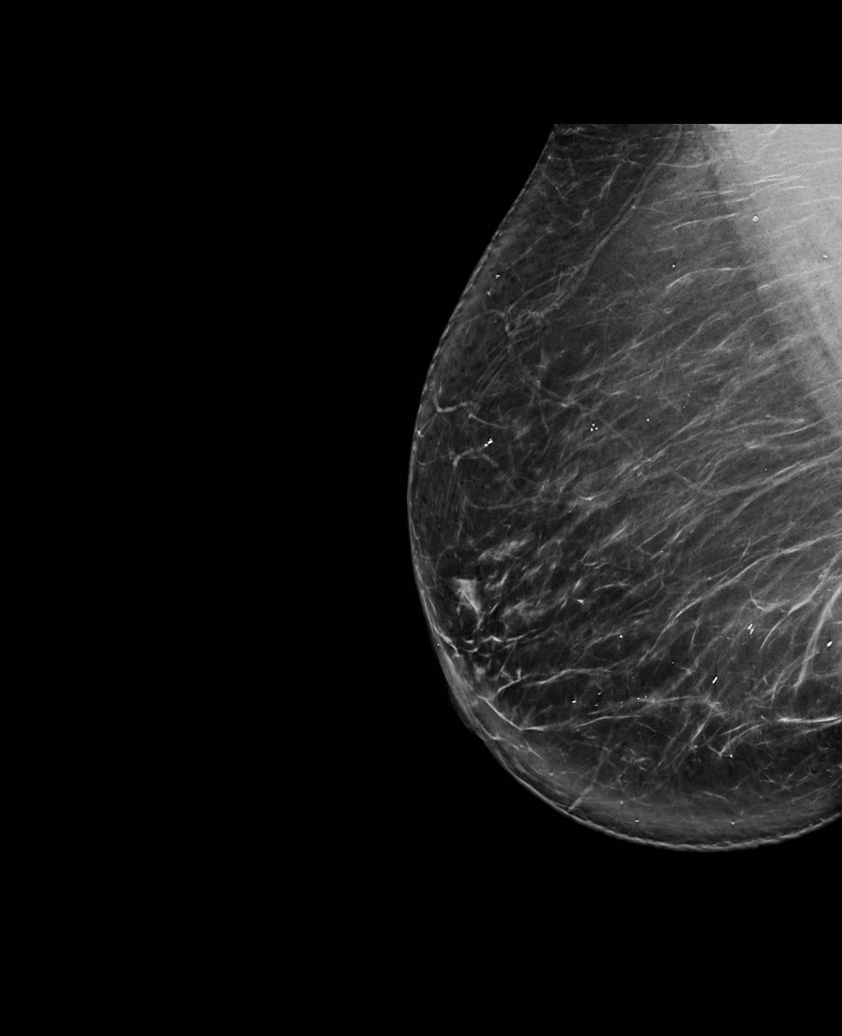

[R MLO synth-2D (2 of 2)]
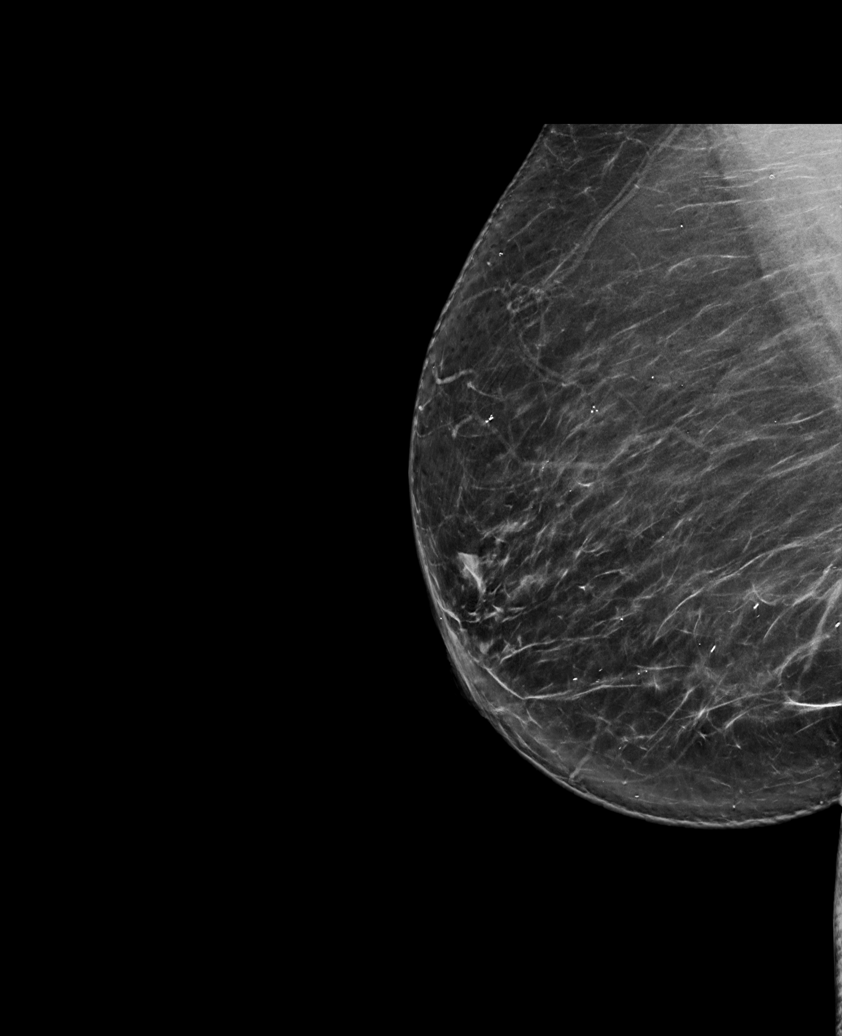

[R CC synth-2D]
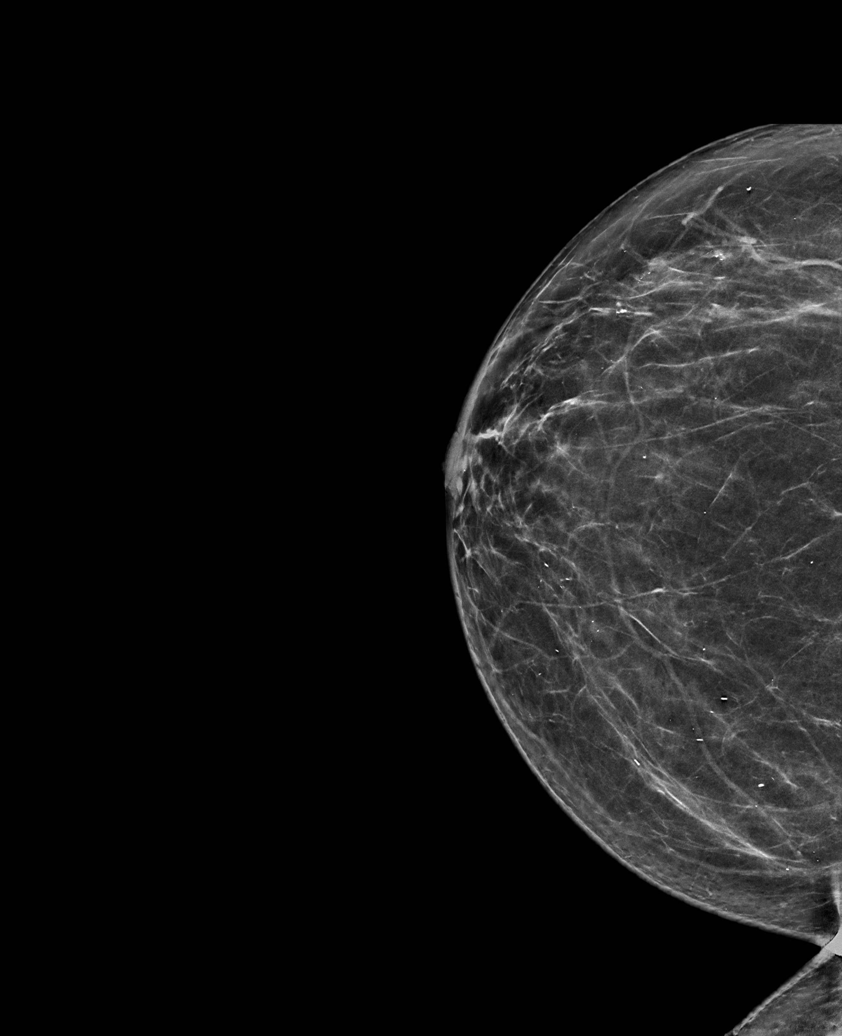

[L MLO synth-2D]
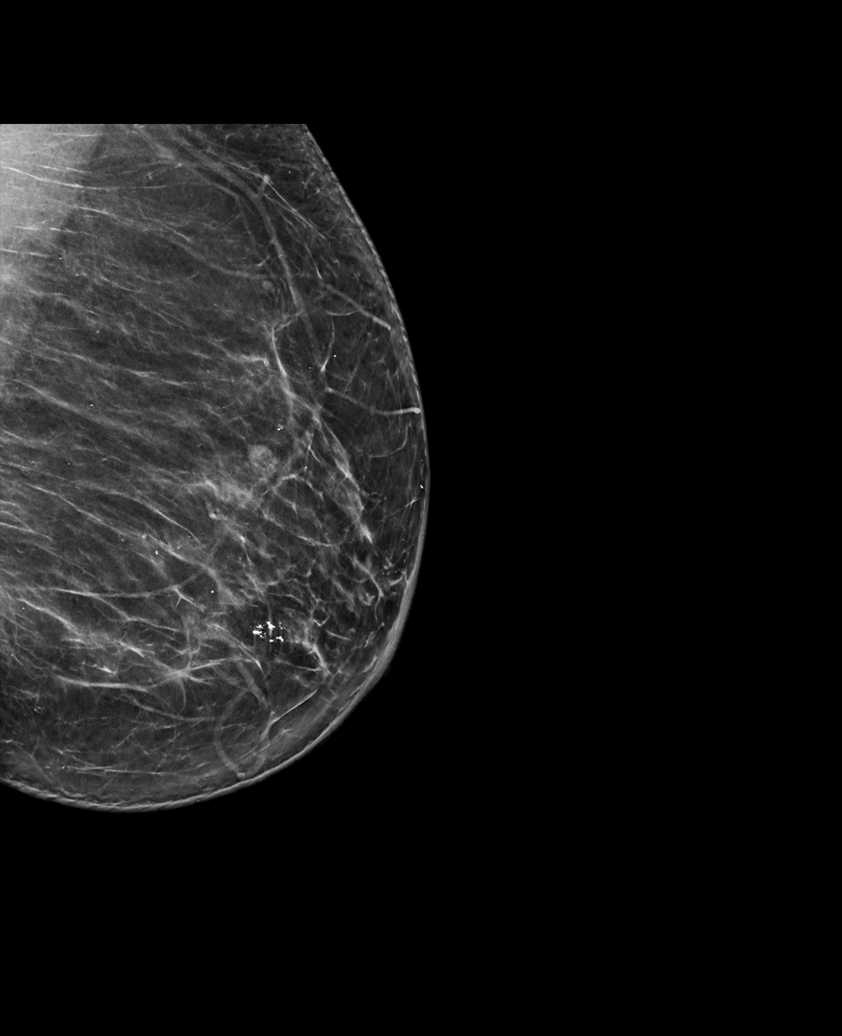

[R MLO tomo · tomo slice 45/90.0]
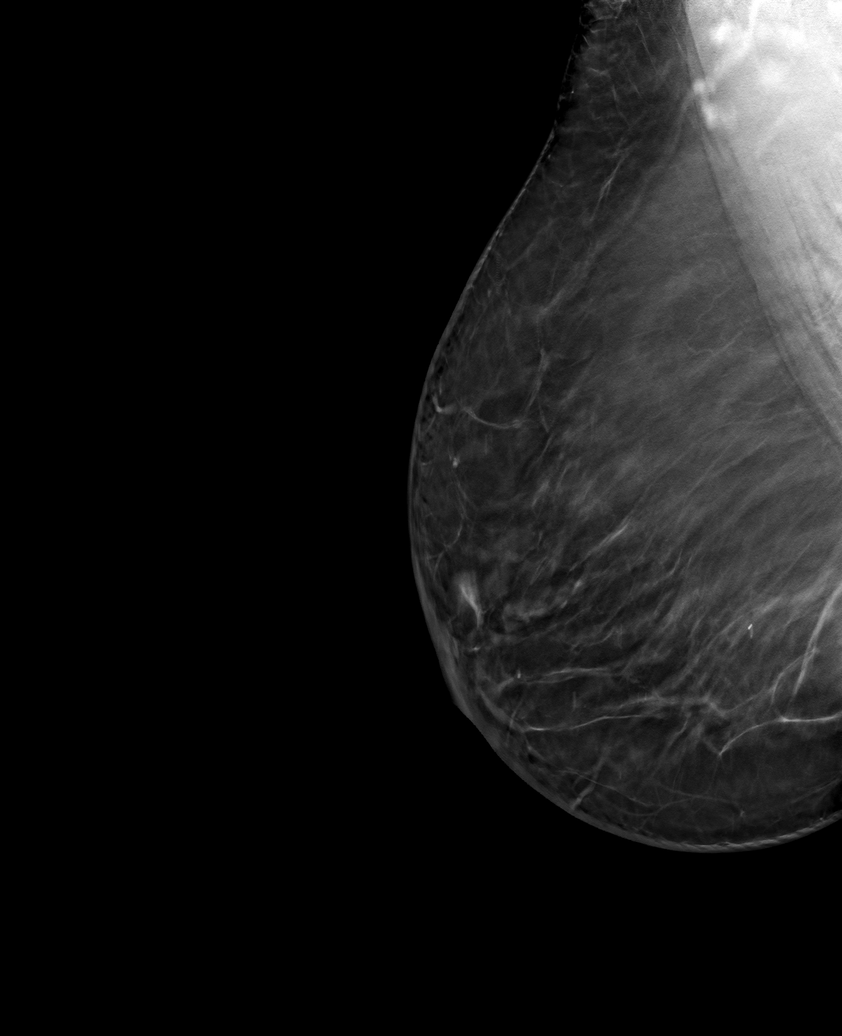

[6 of 30 positions shown; findings below may reference images not displayed]

ACR Breast Density Category b: There are scattered areas of
fibroglandular density.
FINDINGS: There are no findings suspicious for malignancy. Images were
processed with CAD.
IMPRESSION: No mammographic evidence of malignancy. A result letter of this
screening mammogram will be mailed directly to the patient.

RECOMMENDATION:
Screening mammogram in one year. (Code:[id number])

BI-RADS CATEGORY  1: Negative.

## 2022-04-30 DIAGNOSIS — S8001XD Contusion of right knee, subsequent encounter: Secondary | ICD-10-CM | POA: Diagnosis not present

## 2022-04-30 DIAGNOSIS — W1839XD Other fall on same level, subsequent encounter: Secondary | ICD-10-CM | POA: Diagnosis not present

## 2022-04-30 DIAGNOSIS — S60221D Contusion of right hand, subsequent encounter: Secondary | ICD-10-CM | POA: Diagnosis not present

## 2022-04-30 DIAGNOSIS — E1169 Type 2 diabetes mellitus with other specified complication: Secondary | ICD-10-CM | POA: Diagnosis not present

## 2022-04-30 DIAGNOSIS — S80211D Abrasion, right knee, subsequent encounter: Secondary | ICD-10-CM | POA: Diagnosis not present

## 2022-04-30 DIAGNOSIS — S20212D Contusion of left front wall of thorax, subsequent encounter: Secondary | ICD-10-CM | POA: Diagnosis not present

## 2022-04-30 NOTE — Addendum Note (Signed)
Addended by: Gwyndolyn Kaufman on: 04/30/2022 08:42 PM   Modules accepted: Orders

## 2022-05-01 ENCOUNTER — Telehealth: Payer: Self-pay | Admitting: Cardiology

## 2022-05-01 ENCOUNTER — Other Ambulatory Visit: Payer: Self-pay

## 2022-05-01 DIAGNOSIS — I10 Essential (primary) hypertension: Secondary | ICD-10-CM

## 2022-05-01 MED ORDER — METOPROLOL SUCCINATE ER 100 MG PO TB24: 100.0000 mg | ORAL_TABLET | Freq: Every day | ORAL | 3 refills | Status: DC

## 2022-05-01 NOTE — Telephone Encounter (Signed)
Pt's medication was sent to pt's pharmacy as requested. Confirmation received.  °

## 2022-05-01 NOTE — Telephone Encounter (Signed)
*  STAT* If patient is at the pharmacy, call can be transferred to refill team.   1. Which medications need to be refilled? (please list name of each medication and dose if known)   metoprolol succinate (TOPROL-XL) 100 MG 24 hr tablet    2. Which pharmacy/location (including street and city if local pharmacy) is medication to be sent to?  [facility] - [place], [place] - [address]    3. Do they need a 30 day or 90 day supply? 90 day   Delivery date is on 3/14.

## 2022-05-03 ENCOUNTER — Encounter (HOSPITAL_COMMUNITY): Payer: Self-pay

## 2022-05-03 ENCOUNTER — Emergency Department (HOSPITAL_COMMUNITY)
Admission: EM | Admit: 2022-05-03 | Discharge: 2022-05-03 | Disposition: A | Discharge status: Home / Self Care | Payer: Medicare PPO | Attending: Emergency Medicine | Admitting: Emergency Medicine

## 2022-05-03 ENCOUNTER — Other Ambulatory Visit: Payer: Self-pay

## 2022-05-03 DIAGNOSIS — R222 Localized swelling, mass and lump, trunk: Secondary | ICD-10-CM | POA: Diagnosis present

## 2022-05-03 DIAGNOSIS — R21 Rash and other nonspecific skin eruption: Secondary | ICD-10-CM | POA: Diagnosis not present

## 2022-05-03 DIAGNOSIS — Z7982 Long term (current) use of aspirin: Secondary | ICD-10-CM | POA: Diagnosis not present

## 2022-05-03 DIAGNOSIS — Z794 Long term (current) use of insulin: Secondary | ICD-10-CM | POA: Diagnosis not present

## 2022-05-03 DIAGNOSIS — B09 Unspecified viral infection characterized by skin and mucous membrane lesions: Secondary | ICD-10-CM

## 2022-05-03 IMAGING — CT CT HEAD W/O CM
3 series · 15 of 47 positions shown, 18 images · non-contrast
Comparison: 06/01/2017

CLINICAL DATA: Headache.

EXAM:
CT HEAD WITHOUT CONTRAST
TECHNIQUE: Contiguous axial images were obtained from the base of the skull
through the vertex without intravenous contrast.

[Series 2: head wo · axial · 0.45mm/px · z∈[-188,-53]mm · 9 of 33 slices shown, 12 images]
[im 3/33  brain]
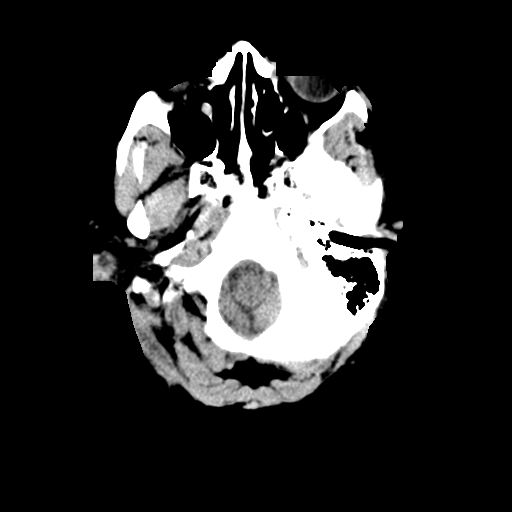
[im 3/33  bone]
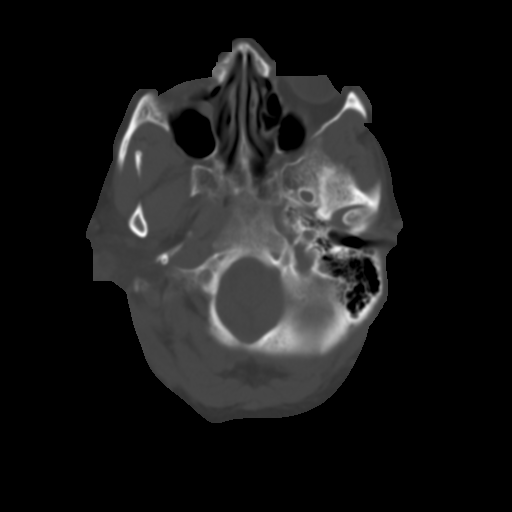
[im 6/33  brain]
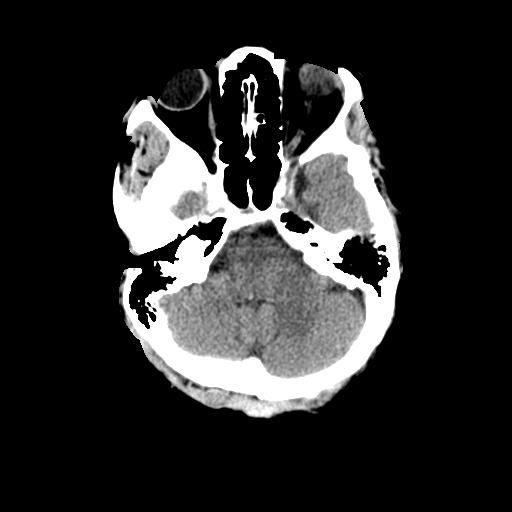
[im 9/33  brain]
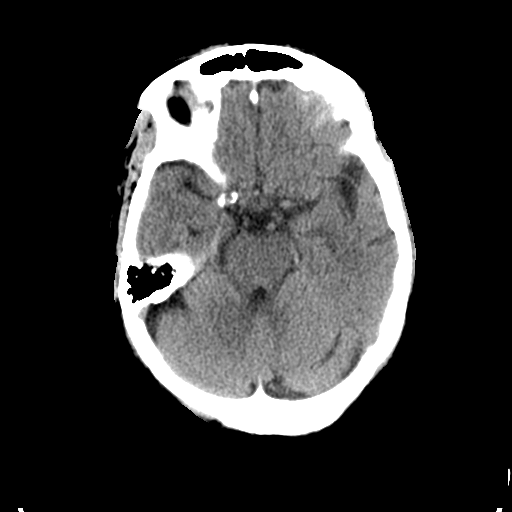
[im 13/33  brain]
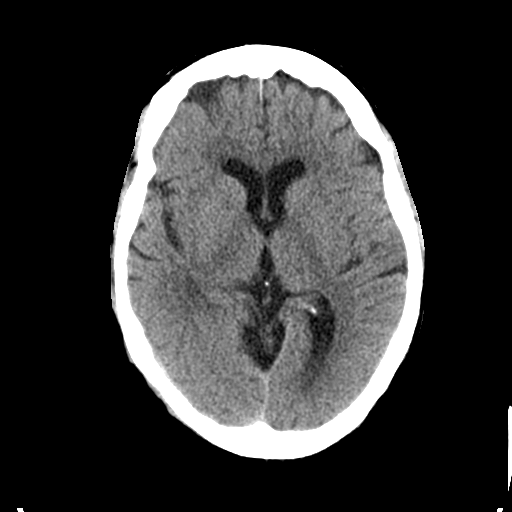
[im 17/33  brain]
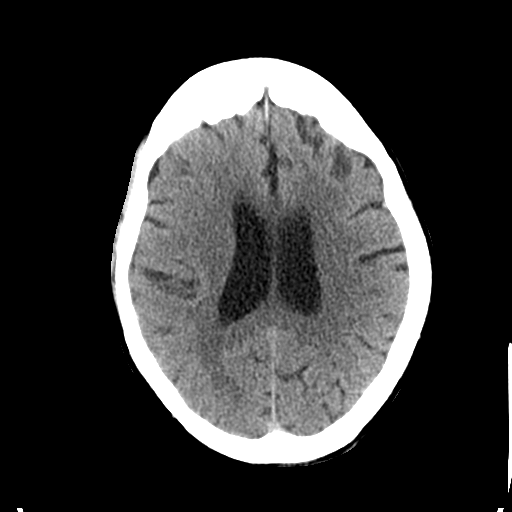
[im 17/33  bone]
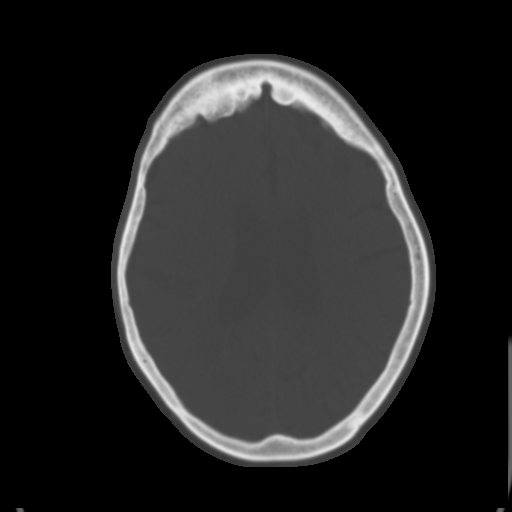
[im 20/33  brain]
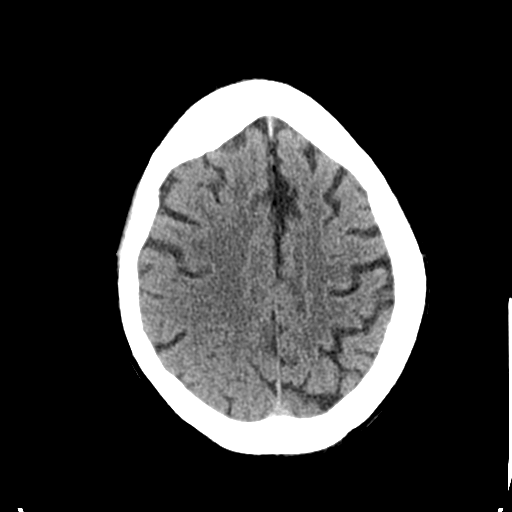
[im 24/33  brain]
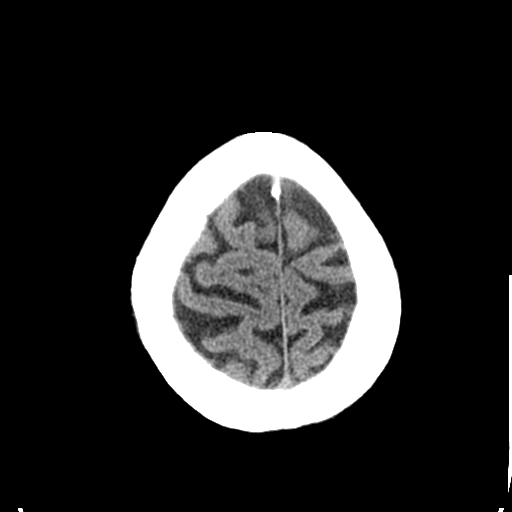
[im 27/33  brain]
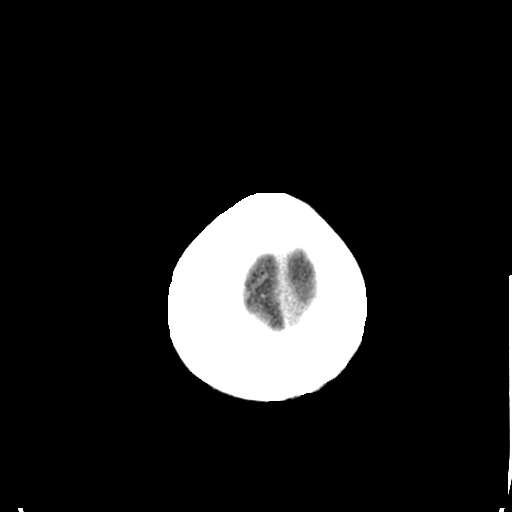
[im 30/33  brain]
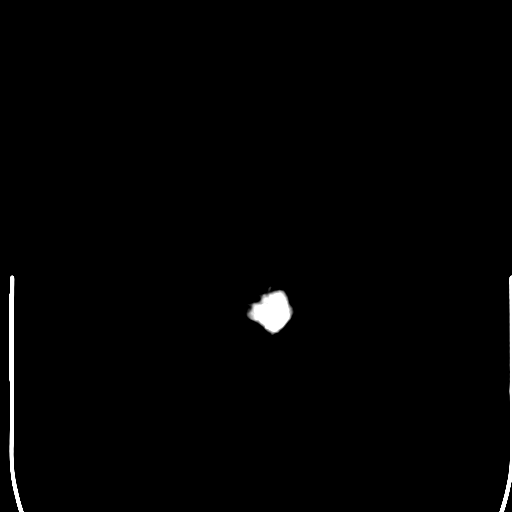
[im 30/33  bone]
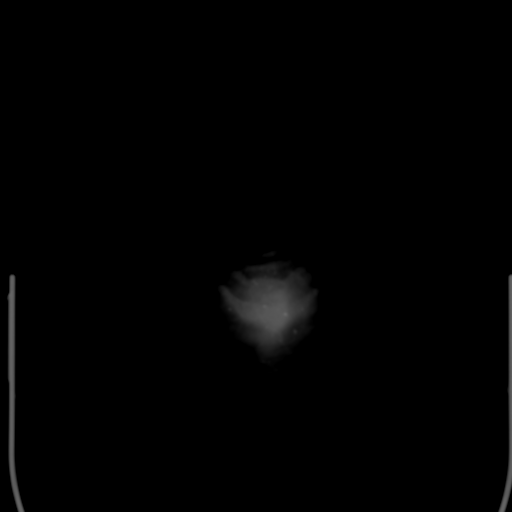

[Series 4: coronal soft tissue · coronal · 0.31mm/px · 3 of 69 slices shown]
[im 23/69  brain]
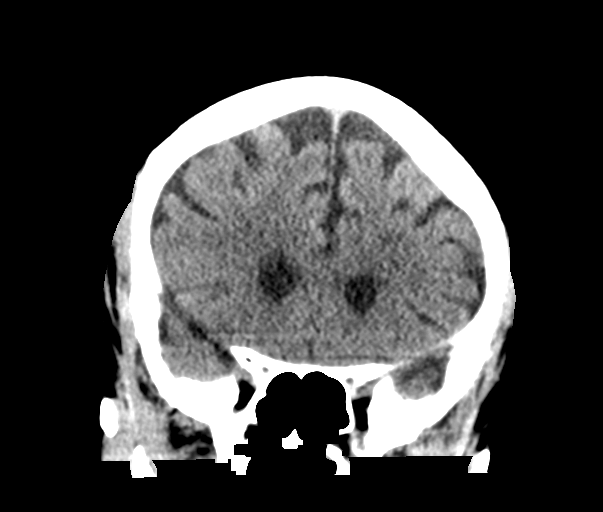
[im 31/69  brain]
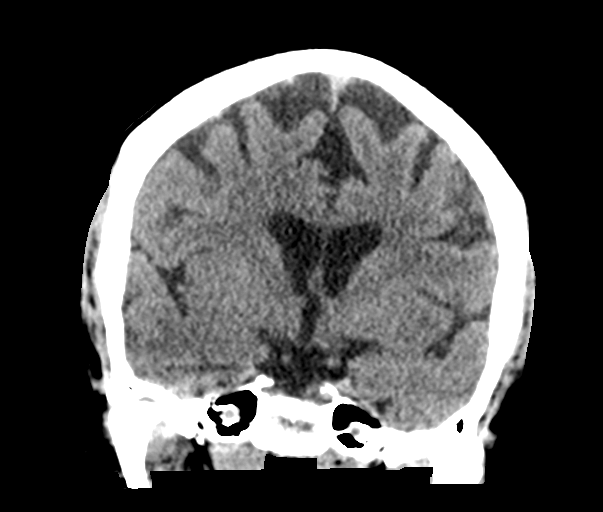
[im 38/69  brain]
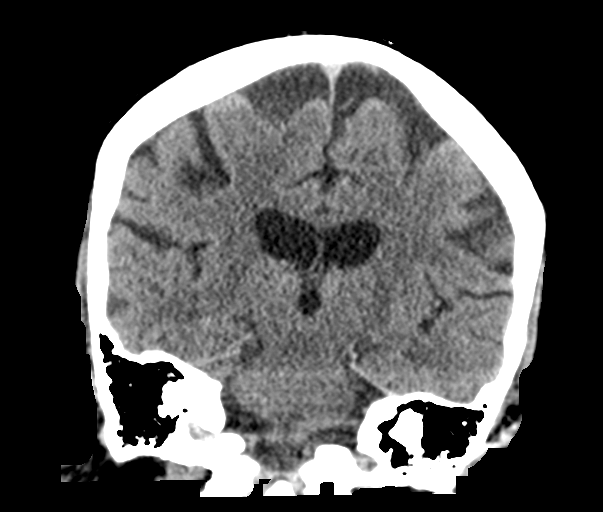

[Series 5: sagittal soft tissue · sagittal · 0.34mm/px · 3 of 65 slices shown]
[im 22/65  brain]
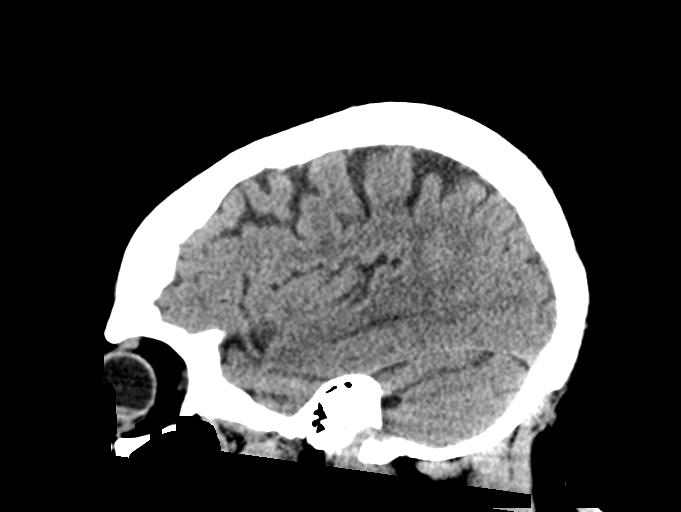
[im 33/65  brain]
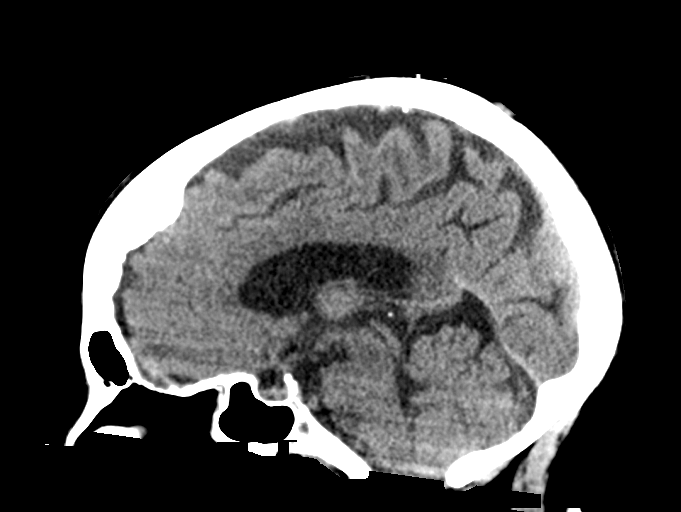
[im 43/65  brain]
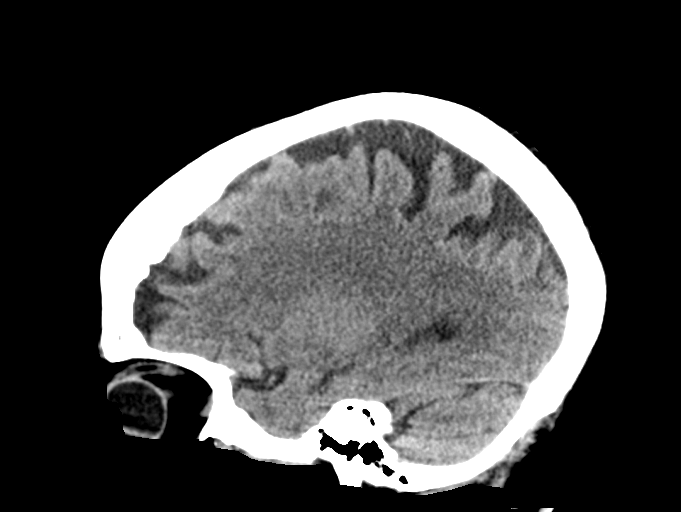

[15 of 47 positions shown; findings below may reference images not displayed]

FINDINGS: Brain: No evidence of acute infarction, hemorrhage, hydrocephalus,
extra-axial collection or mass lesion/mass effect.

Vascular: No hyperdense vessel or unexpected calcification.

Skull: Normal. Negative for fracture or focal lesion.

Sinuses/Orbits: No acute finding.

Other: None.
IMPRESSION: No acute intracranial pathology.

## 2022-05-03 IMAGING — CT CT RENAL STONE PROTOCOL
2 of 4 series · 16 of 46 positions shown, 18 images · non-contrast
Comparison: 03/25/2018

CLINICAL DATA: Abdominal pain. History of right nephrectomy,
appendectomy, and hysterectomy. The patient is status post prior
partial left nephrectomy.

EXAM:
CT ABDOMEN AND PELVIS WITHOUT CONTRAST
TECHNIQUE: Multidetector CT imaging of the abdomen and pelvis was performed
following the standard protocol without IV contrast.

[Series 2: axial st · axial · 0.74mm/px · z∈[-784,-460]mm · 13 of 75 slices shown, 15 images]
[im 5/75  soft-tissue]
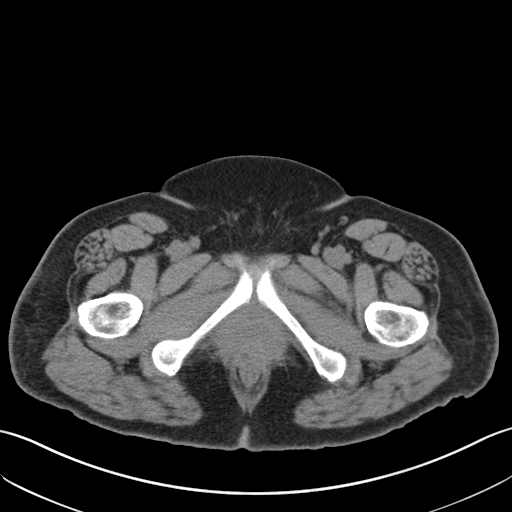
[im 5/75  bone]
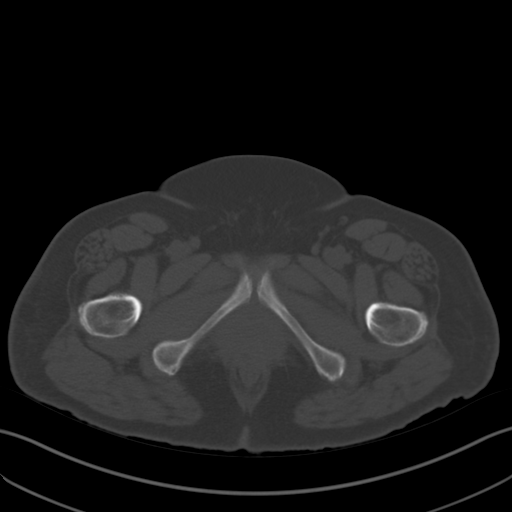
[im 9/75  soft-tissue]
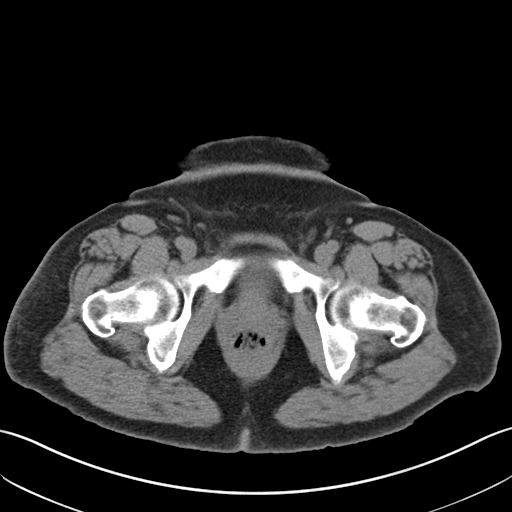
[im 17/75  soft-tissue]
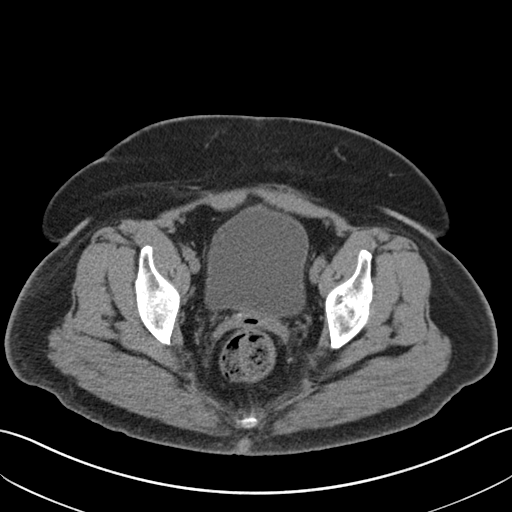
[im 21/75  soft-tissue]
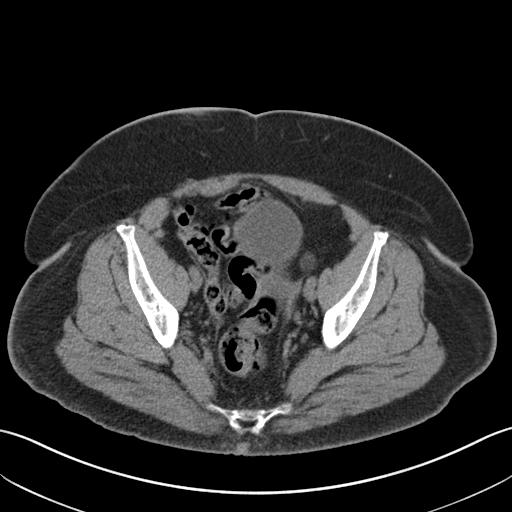
[im 25/75  soft-tissue]
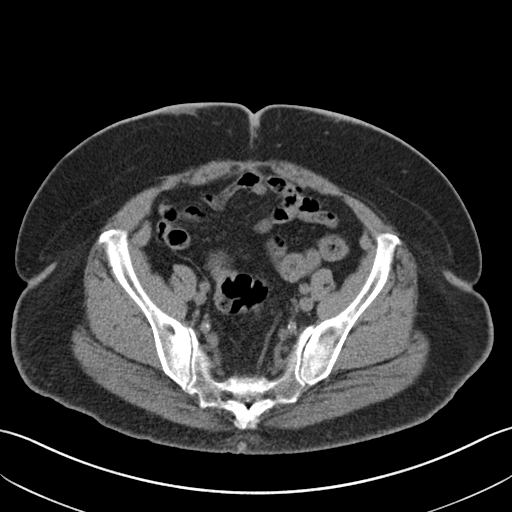
[im 33/75  soft-tissue]
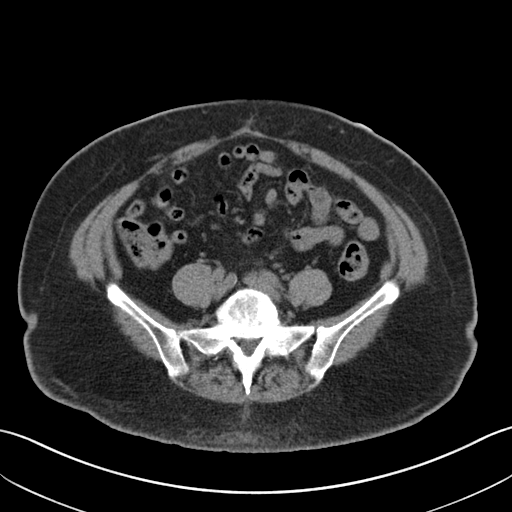
[im 38/75  soft-tissue]
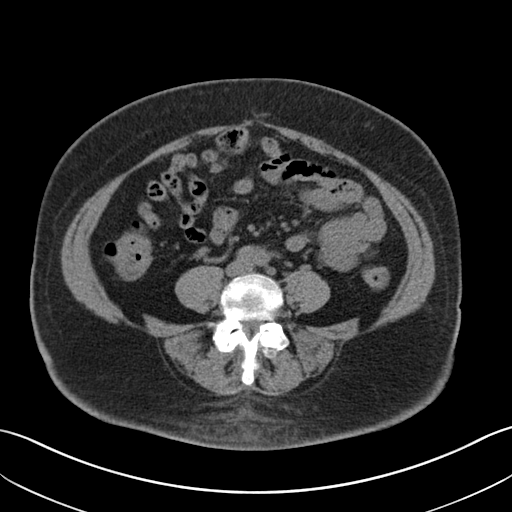
[im 42/75  soft-tissue]
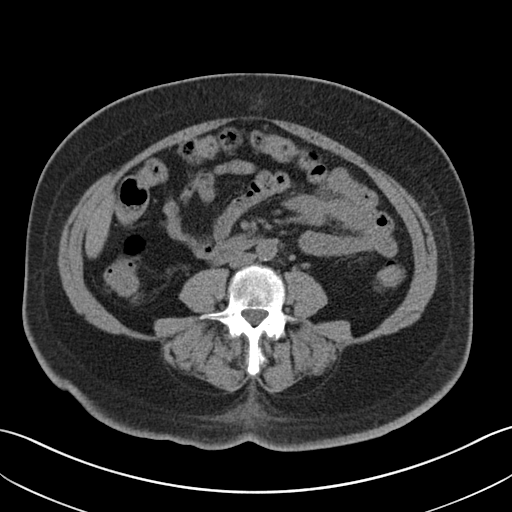
[im 50/75  soft-tissue]
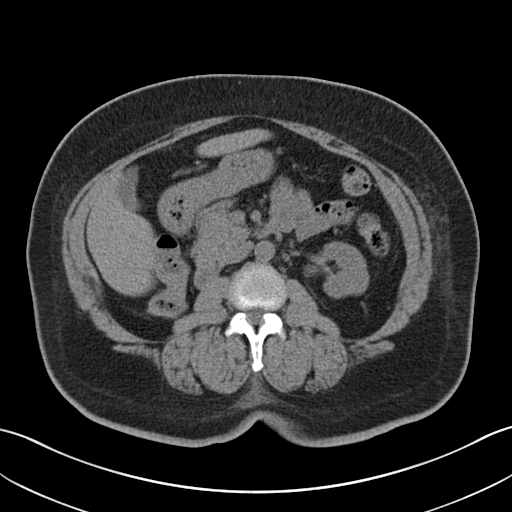
[im 50/75  bone]
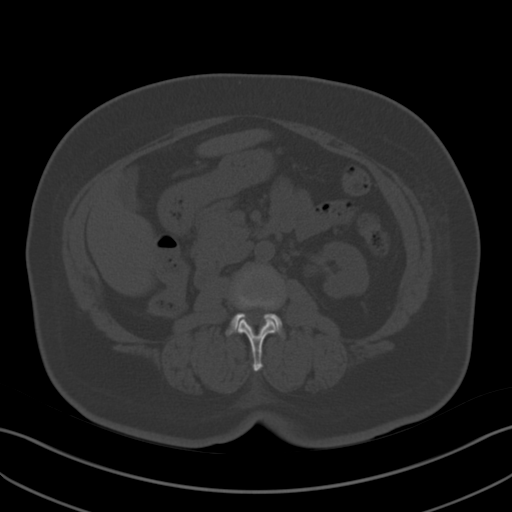
[im 54/75  soft-tissue]
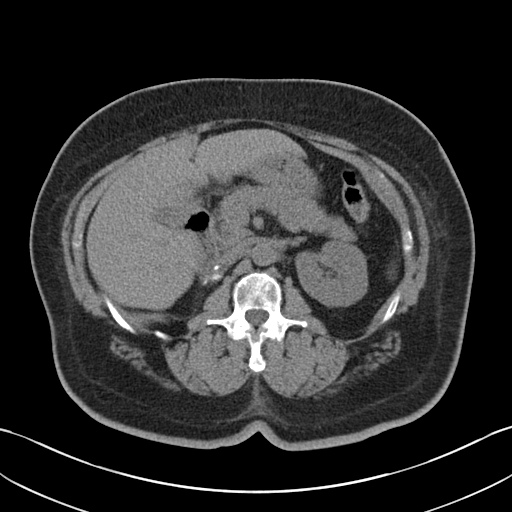
[im 58/75  soft-tissue]
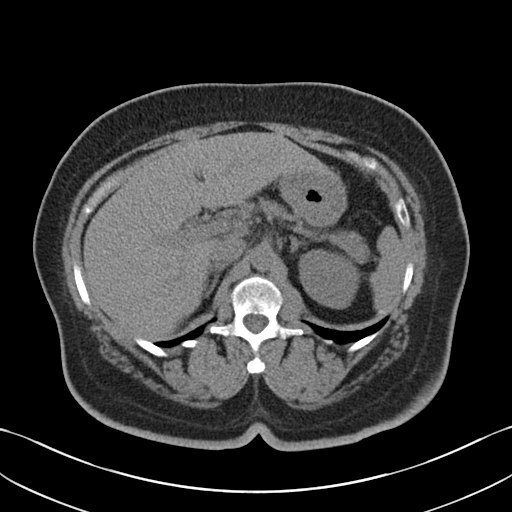
[im 66/75  soft-tissue]
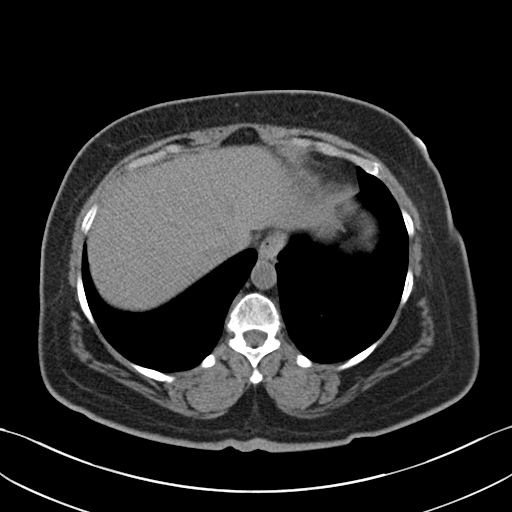
[im 70/75  soft-tissue]
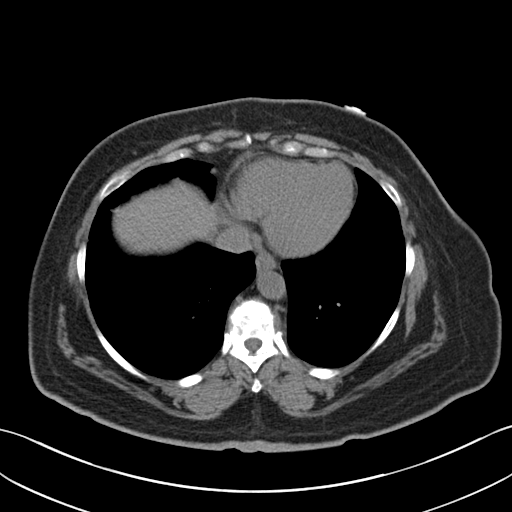

[Series 4: coronal · coronal · 0.66mm/px · 3 of 150 slices shown]
[im 50/150  soft-tissue]
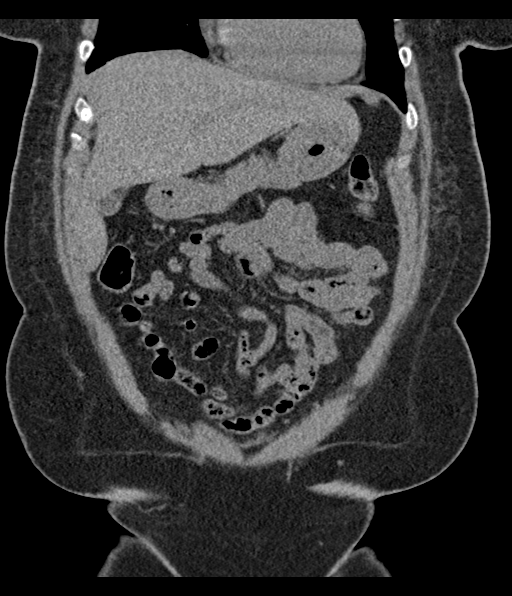
[im 67/150  soft-tissue]
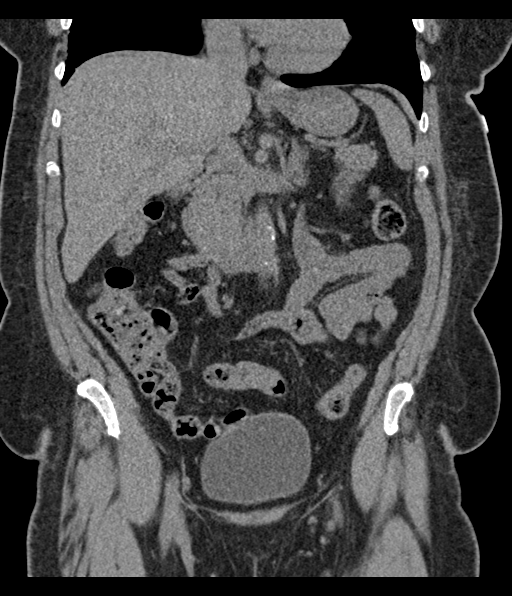
[im 83/150  soft-tissue]
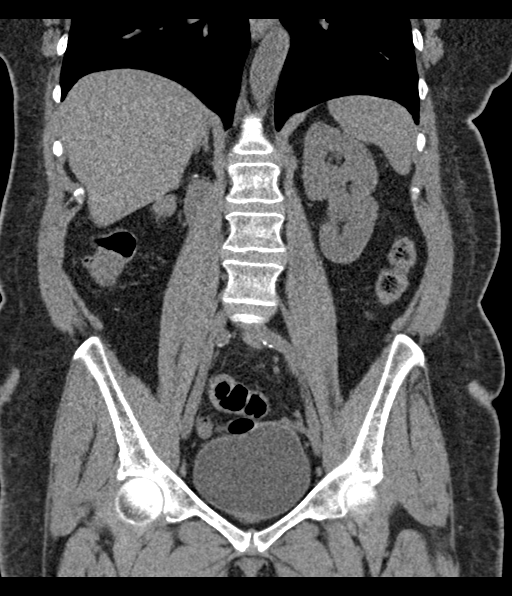

[16 of 46 positions shown; findings below may reference images not displayed]

FINDINGS: Lower chest: The lung bases are clear. The heart size is normal.

Hepatobiliary: The liver is normal. Normal gallbladder.There is no
biliary ductal dilation.

Pancreas: Normal contours without ductal dilatation. No
peripancreatic fluid collection.

Spleen: Unremarkable.

Adrenals/Urinary Tract:

--Adrenal glands: Unremarkable.

--Right kidney/ureter: The right kidney is surgically absent. There
is no soft tissue abnormality within the right nephrectomy bed.

--Left kidney/ureter: There are postsurgical changes of the lower
pole of the left kidney likely related to the prior left partial
nephrectomy. There is no left-sided hydronephrosis. Are no
radiopaque kidney stones.

--Urinary bladder: Unremarkable.

Stomach/Bowel:

--Stomach/Duodenum: No hiatal hernia or other gastric abnormality.
Normal duodenal course and caliber.

--Small bowel: Unremarkable.

--Colon: Unremarkable.

--Appendix: Surgically absent.

Vascular/Lymphatic: Atherosclerotic calcification is present within
the non-aneurysmal abdominal aorta, without hemodynamically
significant stenosis.

--No retroperitoneal lymphadenopathy.

--No mesenteric lymphadenopathy.

--No pelvic or inguinal lymphadenopathy.

Reproductive: Status post hysterectomy. No adnexal mass.

Other: No ascites or free air. Multiple small fat containing
periumbilical hernias are noted.

Musculoskeletal. No acute displaced fractures.
IMPRESSION: 1. No acute abdominopelvic abnormality.
2. Status post right nephrectomy and left partial nephrectomy.

Aortic Atherosclerosis ([id number]-GOW.W).

## 2022-05-03 IMAGING — CR DG CHEST 2V
2 series · 2 of 2 positions shown · non-contrast
Comparison: 01/27/2019

CLINICAL DATA: Chest pain

EXAM:
CHEST - 2 VIEW

[w chest lat]
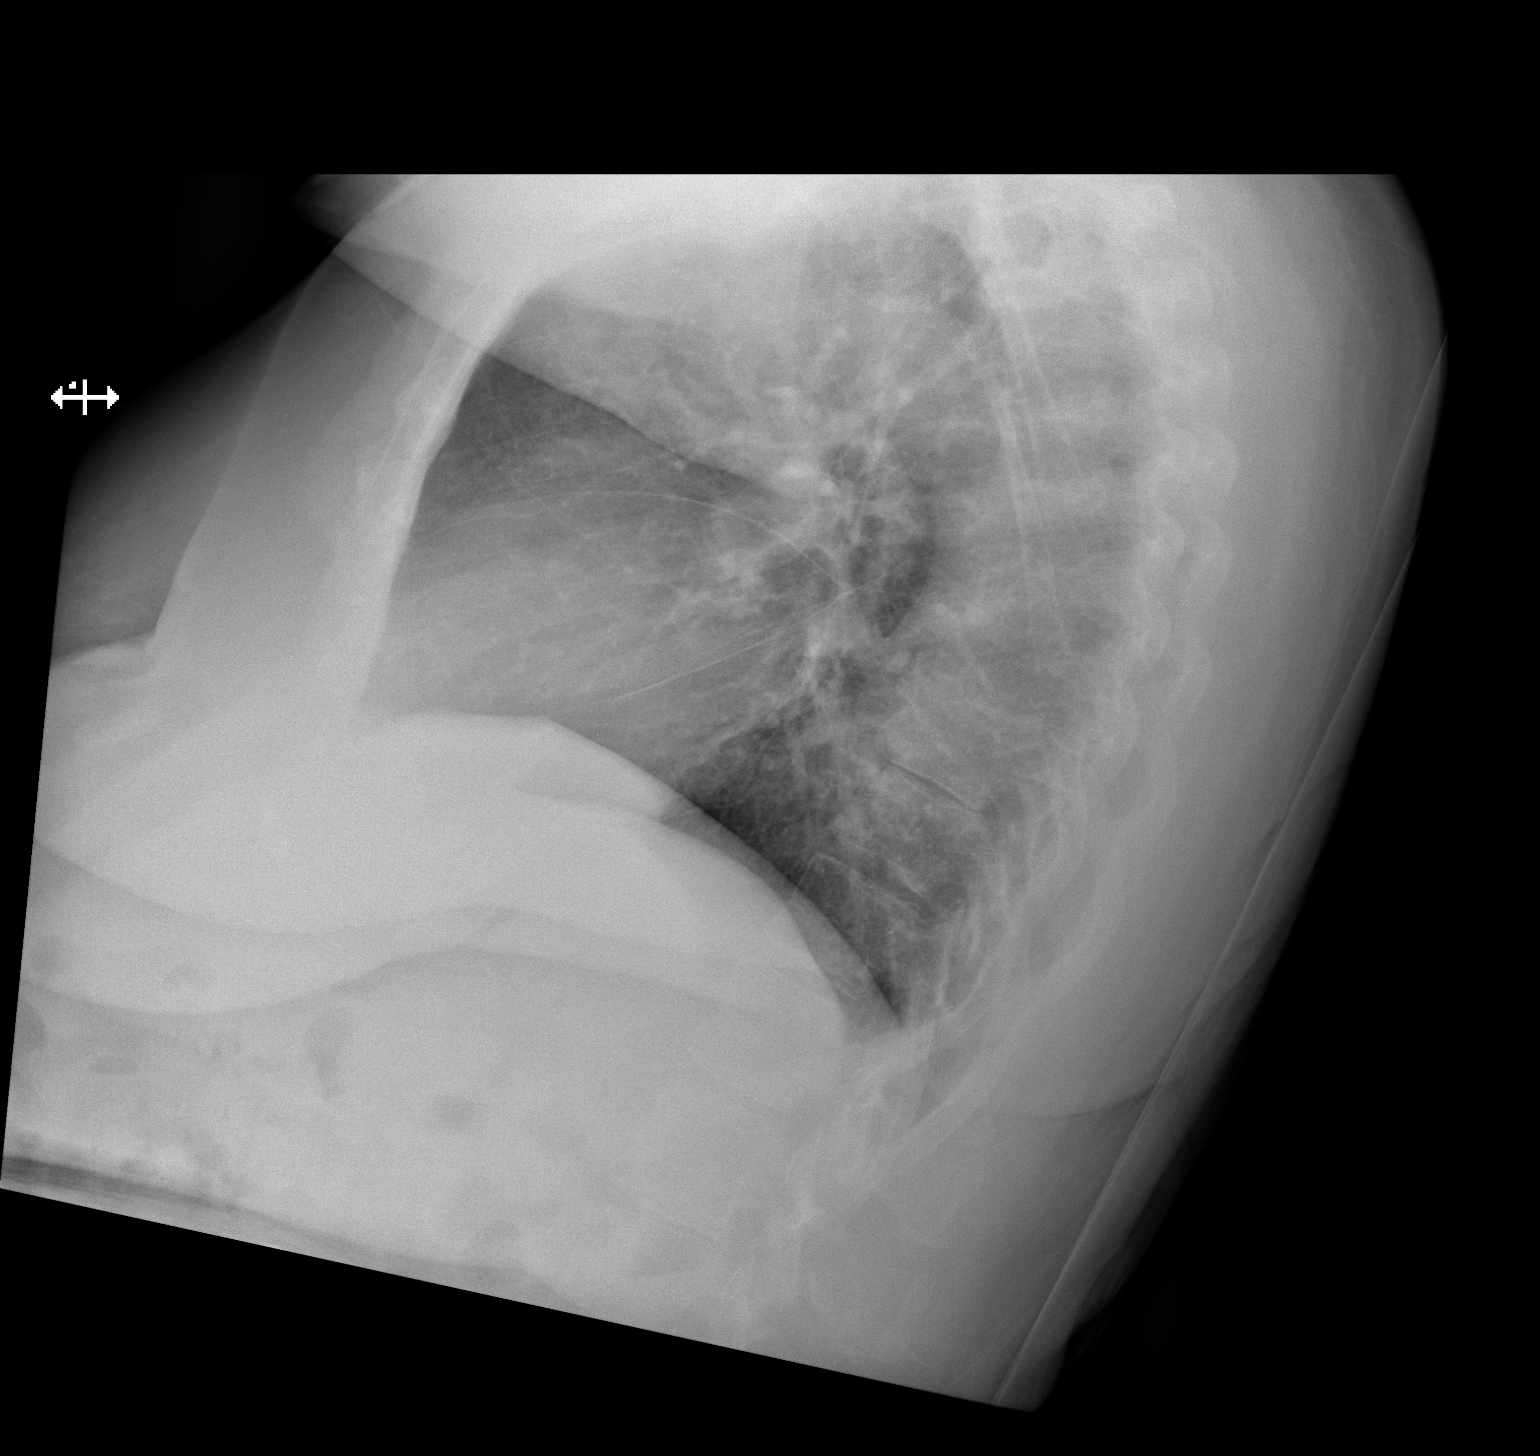

[x chest ap]
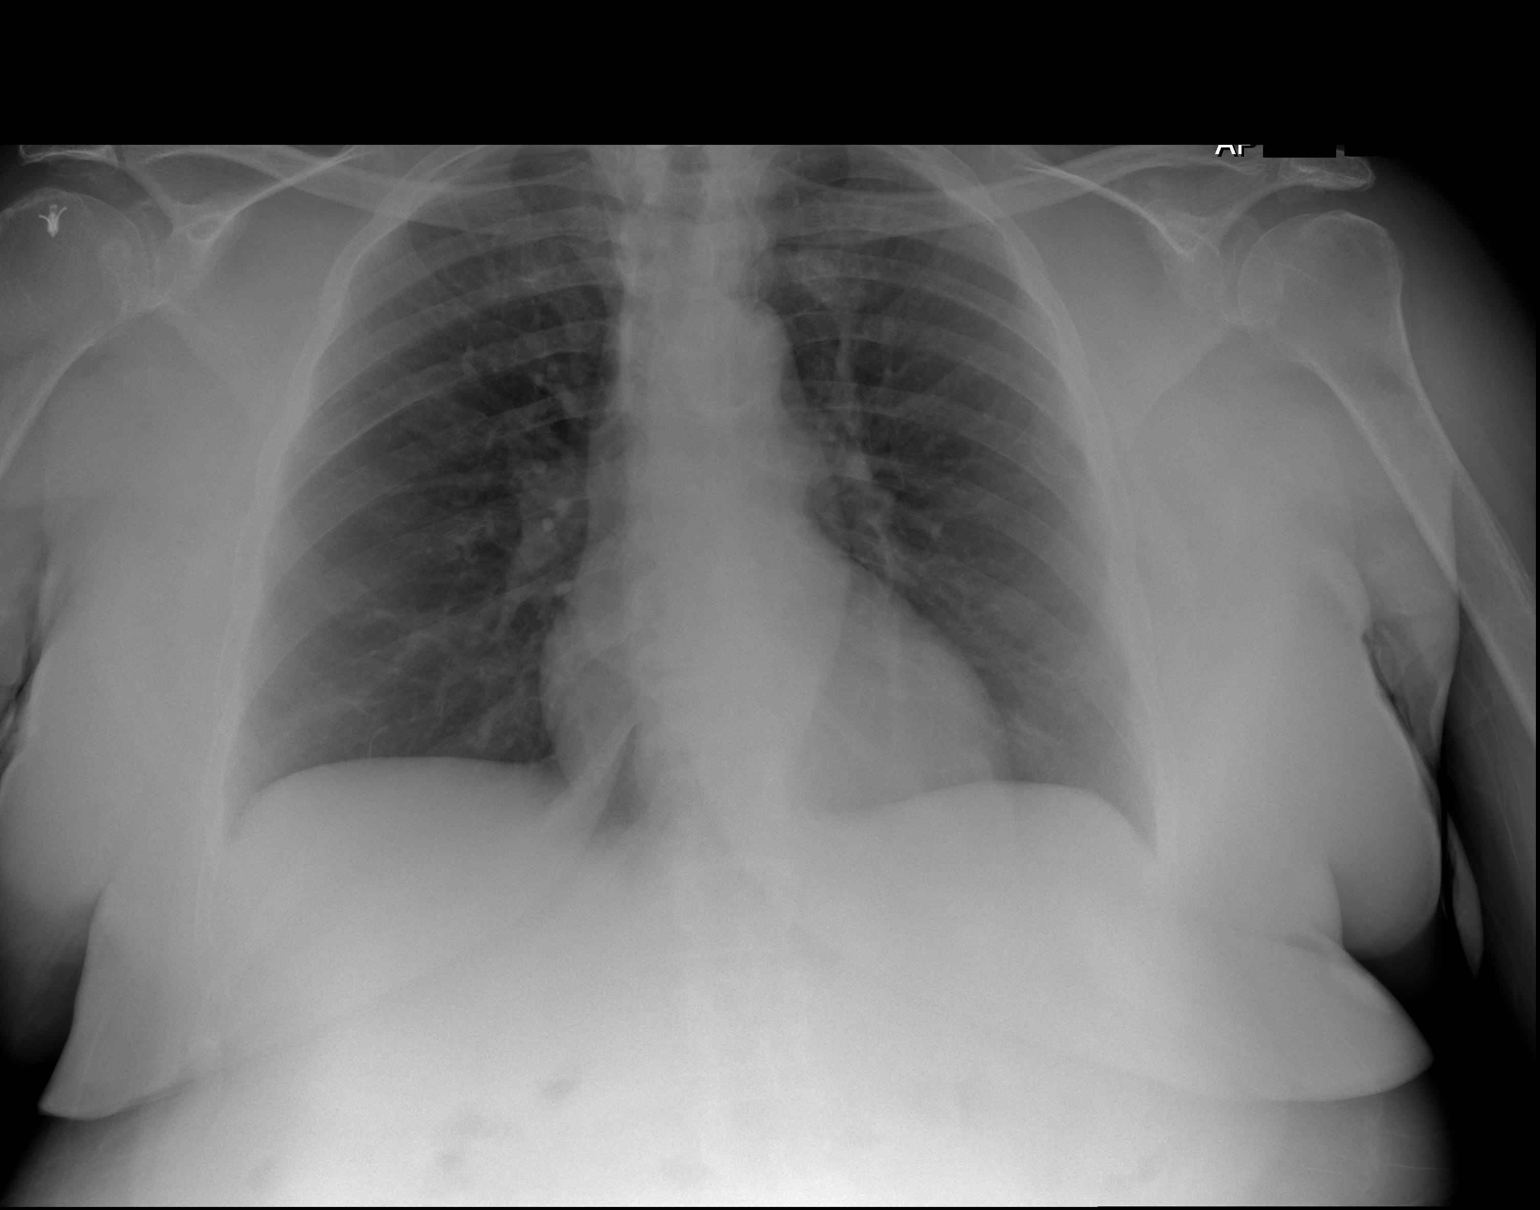

[2 of 2 positions shown; findings below may reference images not displayed]

FINDINGS: The heart size and mediastinal contours are within normal limits.
Both lungs are clear. Incompletely visualized hardware in the
cervical spine.
IMPRESSION: No active cardiopulmonary disease.

## 2022-05-03 MED ORDER — BACITRACIN ZINC 500 UNIT/GM EX OINT
TOPICAL_OINTMENT | Freq: Two times a day (BID) | CUTANEOUS | Status: DC
  Filled 2022-05-03: qty 0.9

## 2022-05-03 NOTE — ED Triage Notes (Signed)
States that she has had a spot on the right side of her rib cage that is painful to the touch. She states that it has been weeping.    Blister noted, with redness.

## 2022-05-03 NOTE — Discharge Instructions (Signed)
I suspect that your rash is from a virus -- as we discussed.   Follow-up with your primary care doctor and return to emergency room should you develop any fevers or fatigue weakness nausea vomiting or any other new or concerning symptoms.  Apply bacitracin that we have given to you twice daily and keep this area covered

## 2022-05-03 NOTE — ED Provider Notes (Signed)
[place] EMERGENCY DEPARTMENT AT [facility] Provider Note   CSN: [id number] Arrival date & time: 05/03/22  1629     History  Chief Complaint  Patient presents with   Wound Infection    [name] is a 82 y.o. female.  HPI  Patient is an 82 year old female presenting to the emergency room today with lesion to the right side of her chest wall that has been present for approximately 1 week.  She states that it is a blister of some kind with some fluid in it that has seeped out.  She states it is not particularly painful.  No fevers chills nausea vomiting lightheadedness or dizziness.  No other associate symptoms  She has a history of chickenpox as a child she has never had shingles.     Home Medications Prior to Admission medications   Medication Sig Start Date End Date Taking? Authorizing Provider  acetaminophen (TYLENOL 8 HOUR) 650 MG CR tablet Take 1 tablet (650 mg total) by mouth every 8 (eight) hours as needed for pain. Patient taking differently: Take 650 mg by mouth in the morning and at bedtime. 02/18/20   Suzy Bouchard, PA-C  albuterol (PROVENTIL,VENTOLIN) 90 MCG/ACT inhaler Inhale 2 puffs into the lungs every 4 (four) hours as needed for wheezing or shortness of breath.     [provider]  amLODipine (NORVASC) 10 MG tablet Take 1 tablet (10 mg total) by mouth daily. 04/10/22   Sueanne Margarita, MD  aspirin EC 81 MG tablet Take 81 mg by mouth at bedtime.    [provider]  atorvastatin (LIPITOR) 10 MG tablet Take 10 mg by mouth every morning.    [provider]  cholecalciferol (VITAMIN D) 1000 units tablet Take 1,000 Units by mouth daily.    [provider]  cyclobenzaprine (FLEXERIL) 5 MG tablet Take 1 tablet (5 mg total) by mouth 2 (two) times daily as needed for up to 20 doses for muscle spasms. Patient not taking: Reported on 04/10/2022 03/29/22   Lennice Sites, DO  donepezil (ARICEPT) 10 MG tablet Take 1 tablet  (10 mg total) by mouth at bedtime. Must be seen for further refills. Call [phone]. 12/27/20   Ward Givens, NP  fluticasone (FLONASE) 50 MCG/ACT nasal spray Place 1 spray into both nostrils daily. Patient not taking: Reported on 04/10/2022 08/27/20   Providence Lanius A, PA-C  furosemide (LASIX) 20 MG tablet Take 10 mg by mouth daily. Patient not taking: Reported on 04/10/2022 07/25/21   [provider]  gabapentin (NEURONTIN) 300 MG capsule Take 1 capsule (300 mg total) by mouth at bedtime. Patient taking differently: Take 300 mg by mouth 2 (two) times daily. 07/16/16   Wallene Huh, DPM  insulin degludec (TRESIBA) 200 UNIT/ML FlexTouch Pen Inject 80 Units into the skin daily before breakfast.    [provider]  lidocaine (LIDODERM) 5 % Place 1 patch onto the skin daily. Remove & Discard patch within 12 hours or as directed by MD Patient not taking: Reported on 04/10/2022 03/29/22   Lennice Sites, DO  metoprolol succinate (TOPROL-XL) 100 MG 24 hr tablet Take 1 tablet (100 mg total) by mouth daily. Take with or immediately following a meal. 05/01/22   Robak, Eber Hong, MD  mirabegron ER (MYRBETRIQ) 50 MG TB24 tablet Take 50 mg by mouth daily. 06/18/21   [provider]  Multiple Vitamins-Minerals (CENTRUM SILVER ADULT 50+ PO) Take 1 tablet by mouth daily.    [provider]  pantoprazole (PROTONIX) 40 MG tablet Take 1 tablet (40 mg total) by mouth daily. 08/29/21   Horton, Barbette Hair, MD      Allergies    Sulfa antibiotics    Review of Systems   Review of Systems  Physical Exam Updated Vital Signs BP (!) 147/72 (BP Location: Left Arm)   Pulse 66   Temp 98.6 F (37 C) (Oral)   Resp 17   Ht 4\' 11"  (1.499 m)   Wt 64.9 kg   SpO2 99%   BMI 28.88 kg/m  Physical Exam Vitals and nursing note reviewed.  Constitutional:      General: She is not in acute distress.    Appearance: Normal appearance. She is not ill-appearing.  HENT:     Head: Normocephalic  and atraumatic.  Eyes:     General: No scleral icterus.       Right eye: No discharge.        Left eye: No discharge.     Conjunctiva/sclera: Conjunctivae normal.  Pulmonary:     Effort: Pulmonary effort is normal.     Breath sounds: No stridor.  Skin:    Comments: Vesicular rash with 3 small bubbles and a small erythematous patch of skin that is approximately dime sized.  No rash following the dermatome.  No other rash.  No tenderness with manipulation of the rash.  Neurological:     Mental Status: She is alert and oriented to person, place, and time. Mental status is at baseline.     ED Results / Procedures / Treatments   Labs (all labs ordered are listed, but only abnormal results are displayed) Labs Reviewed - No data to display  EKG None  Radiology No results found.  Procedures Procedures    Medications Ordered in ED Medications  bacitracin ointment (has no administration in time range)    ED Course/ Medical Decision Making/ A&P                             Medical Decision Making Risk OTC drugs.    Patient is well-appearing 82 year old female with a small dime sized vesicular rash on erythematous base.  I suspect this is a herpetic rash of some kind.  Low suspicion for shingles given that is not following a dermatome pattern  Vitals normal.  No systemic symptoms  FU w pcp  Final Clinical Impression(s) / ED Diagnoses Final diagnoses:  Viral rash    Rx / DC Orders ED Discharge Orders     None         Tedd Sias, [place] 05/03/22 2125    Wyvonnia Dusky, MD 05/03/22 2322

## 2022-05-08 IMAGING — CR DG ABDOMEN ACUTE W/ 1V CHEST
1 series · 1 of 1 positions shown · non-contrast
Comparison: Abdomen series June 22, 2015; CT abdomen and pelvis June 22, 2019

CLINICAL DATA: Chest pain; abdominal pain with nausea

EXAM:
DG ABDOMEN ACUTE W/ 1V CHEST

[w chest pa]
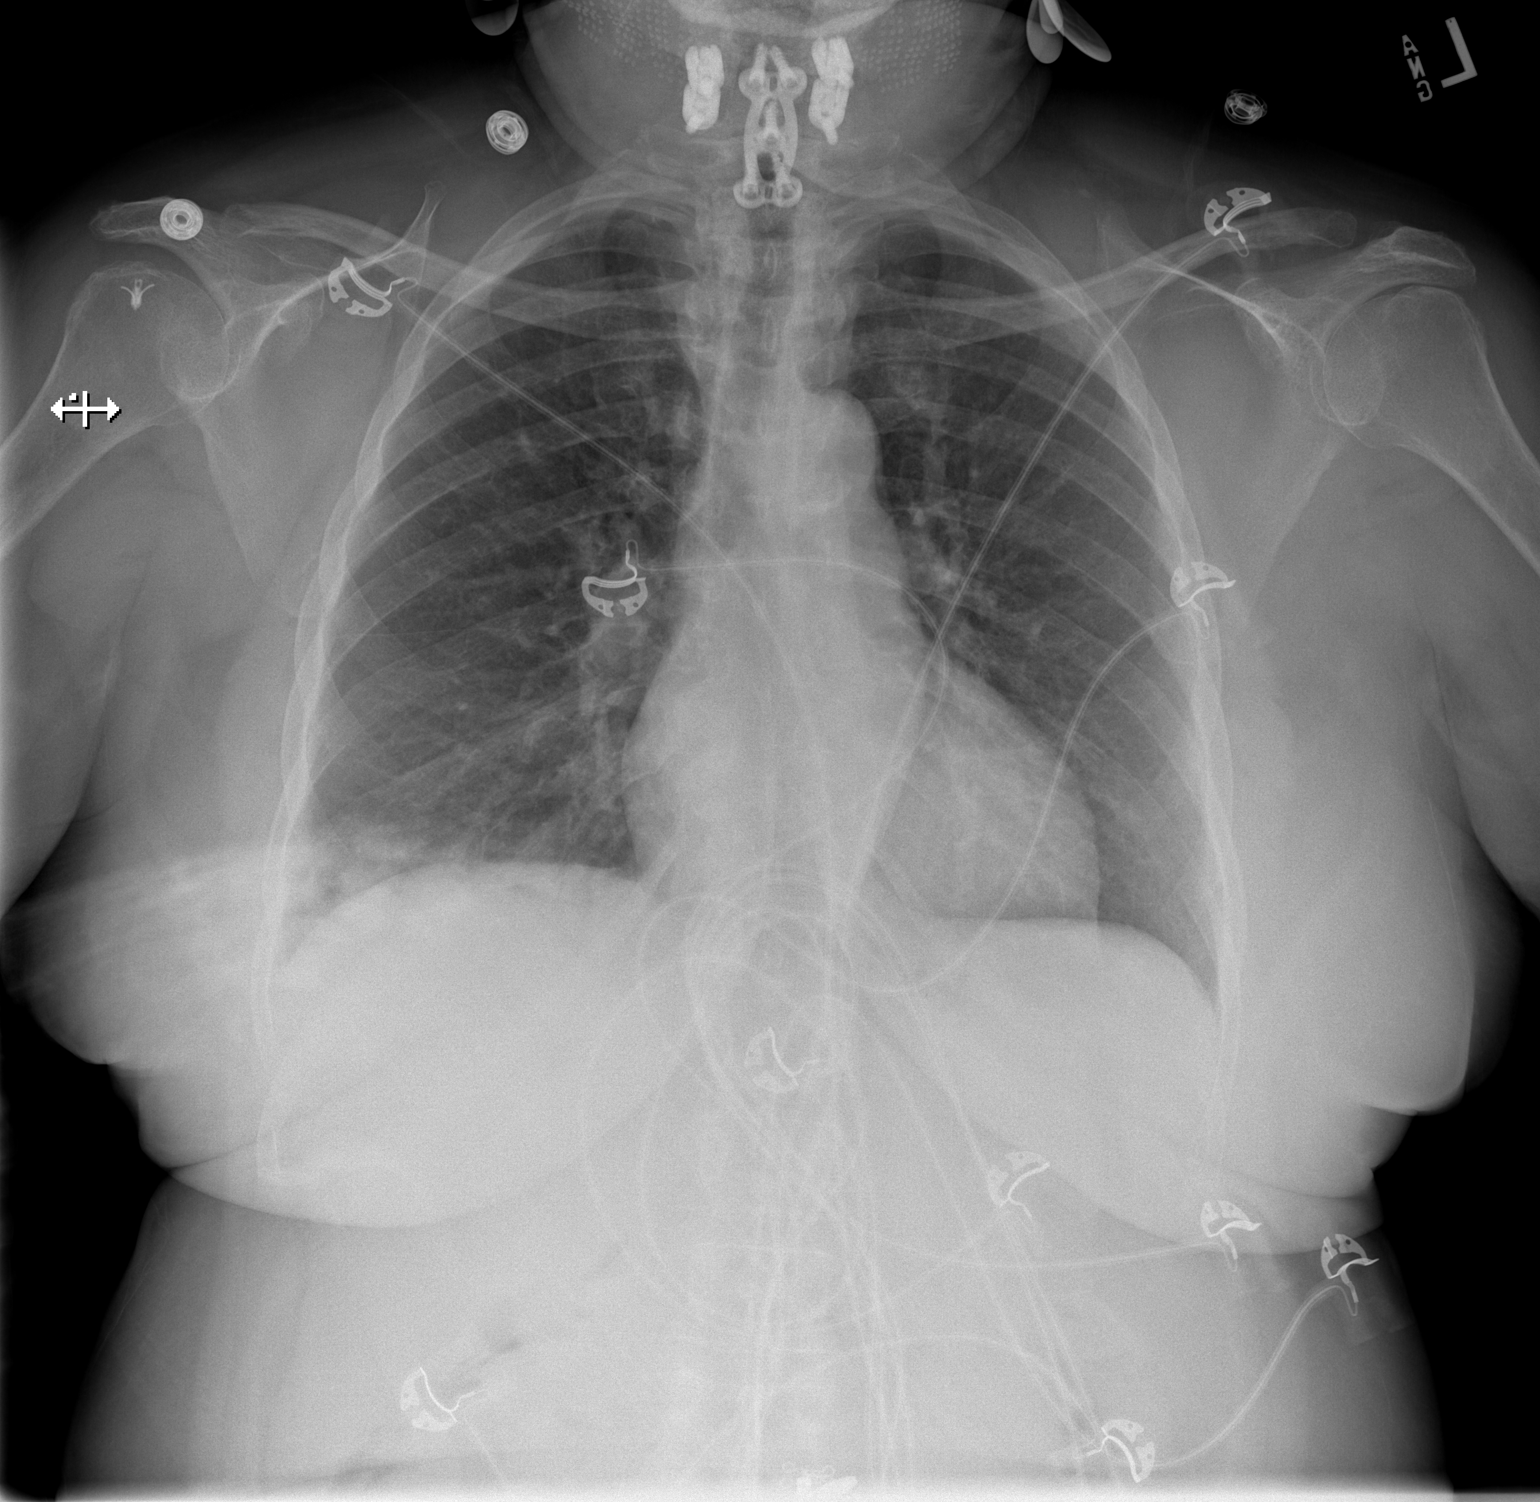

[1 of 1 positions shown; findings below may reference images not displayed]

FINDINGS: PA chest: Lungs are clear. Heart size and pulmonary vascularity are
normal. No adenopathy.

Supine and upright abdomen: There is diffuse stool throughout the
colon. There is no bowel dilatation or air-fluid level to suggest
bowel obstruction. No free air. There are apparent phleboliths in
the left pelvis.
IMPRESSION: Diffuse stool throughout colon. Question a degree of underlying
constipation. No bowel obstruction or free air. Lungs are clear.

## 2022-05-10 ENCOUNTER — Ambulatory Visit (HOSPITAL_COMMUNITY): Payer: Medicare PPO | Attending: Cardiovascular Disease

## 2022-05-10 DIAGNOSIS — R072 Precordial pain: Secondary | ICD-10-CM

## 2022-05-10 LAB — ECHOCARDIOGRAM COMPLETE: Area-P 1/2: 3.53 cm2

## 2022-05-13 ENCOUNTER — Telehealth: Payer: Self-pay

## 2022-05-13 DIAGNOSIS — E1169 Type 2 diabetes mellitus with other specified complication: Secondary | ICD-10-CM | POA: Diagnosis not present

## 2022-05-13 DIAGNOSIS — S31100A Unspecified open wound of abdominal wall, right upper quadrant without penetration into peritoneal cavity, initial encounter: Secondary | ICD-10-CM | POA: Diagnosis not present

## 2022-05-13 DIAGNOSIS — I10 Essential (primary) hypertension: Secondary | ICD-10-CM | POA: Diagnosis not present

## 2022-05-13 NOTE — Telephone Encounter (Signed)
-----   Message from Sueanne Margarita, MD sent at 05/13/2022  2:48 PM EDT ----- Echo showed normal heart function with mildly thickened heart muscle and increased stiffness of heart muscle called diastolic dysfunction, mildly enlarged LA.

## 2022-05-13 NOTE — Telephone Encounter (Signed)
Echo results reviewed with patient who verbalizes understanding of normal pumping function, mildly thickened heart muscle as well as diastolic dysfunction and mildly enlarged left atrium.

## 2022-05-15 ENCOUNTER — Other Ambulatory Visit: Payer: Self-pay | Admitting: Cardiology

## 2022-05-15 DIAGNOSIS — I10 Essential (primary) hypertension: Secondary | ICD-10-CM

## 2022-05-19 DIAGNOSIS — Z794 Long term (current) use of insulin: Secondary | ICD-10-CM | POA: Diagnosis not present

## 2022-05-19 DIAGNOSIS — E1122 Type 2 diabetes mellitus with diabetic chronic kidney disease: Secondary | ICD-10-CM | POA: Diagnosis not present

## 2022-05-19 DIAGNOSIS — N184 Chronic kidney disease, stage 4 (severe): Secondary | ICD-10-CM | POA: Diagnosis not present

## 2022-05-19 DIAGNOSIS — I129 Hypertensive chronic kidney disease with stage 1 through stage 4 chronic kidney disease, or unspecified chronic kidney disease: Secondary | ICD-10-CM | POA: Diagnosis not present

## 2022-05-20 ENCOUNTER — Telehealth (HOSPITAL_COMMUNITY): Payer: Self-pay | Admitting: *Deleted

## 2022-05-20 NOTE — Telephone Encounter (Signed)
Reaching out to patient to offer assistance regarding upcoming cardiac imaging study; pt verbalizes understanding of appt date/time, parking situation and where to check in, pre-test NPO status, and verified current allergies; name and call back number provided for further questions should they arise  Larenzo Caples RN Navigator Cardiac Imaging [facility] [phone] office [phone] cell  Patient aware to avoid caffeine 12 hours prior to her cardiac PET scan.  

## 2022-05-21 ENCOUNTER — Ambulatory Visit: Payer: Medicare PPO | Attending: Cardiovascular Disease | Admitting: Pharmacist

## 2022-05-21 VITALS — BP 145/62 | HR 55

## 2022-05-21 DIAGNOSIS — I10 Essential (primary) hypertension: Secondary | ICD-10-CM | POA: Diagnosis not present

## 2022-05-21 NOTE — Patient Instructions (Signed)
Summary of today's discussion  Continue amlodipine 10mg  daily, metoprolol succinate 100mg  daily, furosemide 10mg  daily  2. Please check your blood pressure once or twice a day a few hours after taking your medications  3.please bring your blood pressure cuff with you to your next visit  4.  5.   Your blood pressure goal is <140/80  To check your pressure at home you will need to:  1. Sit up in a chair, with feet flat on the floor and back supported. Do not cross your ankles or legs. 2. Rest your left arm so that the cuff is about heart level. If the cuff goes on your upper arm,  then just relax the arm on the table, arm of the chair or your lap. If you have a wrist cuff, we  suggest relaxing your wrist against your chest (think of it as Pledging the Flag with the  wrong arm).  3. Place the cuff snugly around your arm, about 1 inch above the crook of your elbow. The  cords should be inside the groove of your elbow.  4. Sit quietly, with the cuff in place, for about 5 minutes. After that 5 minutes press the power  button to start a reading. 5. Do not talk or move while the reading is taking place.  6. Record your readings on a sheet of paper. Although most cuffs have a memory, it is often  easier to see a pattern developing when the numbers are all in front of you.  7. You can repeat the reading after 1-3 minutes if it is recommended  Make sure your bladder is empty and you have not had caffeine or tobacco within the last 30 min  Always bring your blood pressure log with you to your appointments. If you have not brought your monitor in to be double checked for accuracy, please bring it to your next appointment.  You can find a list of validated (accurate) blood pressure cuffs at [employer]   Important lifestyle changes to control high blood pressure  Intervention  Effect on the BP  Lose extra pounds and watch your waistline Weight loss is one of the most effective lifestyle  changes for controlling blood pressure. If you're overweight or obese, losing even a small amount of weight can help reduce blood pressure. Blood pressure might go down by about 1 millimeter of mercury (mm Hg) with each kilogram (about 2.2 pounds) of weight lost.  Exercise regularly As a general goal, aim for at least 30 minutes of moderate physical activity every day. Regular physical activity can lower high blood pressure by about 5 to 8 mm Hg.  Eat a healthy diet Eating a diet rich in whole grains, fruits, vegetables, and low-fat dairy products and low in saturated fat and cholesterol. A healthy diet can lower high blood pressure by up to 11 mm Hg.  Reduce salt (sodium) in your diet Even a small reduction of sodium in the diet can improve heart health and reduce high blood pressure by about 5 to 6 mm Hg.  Limit alcohol One drink equals 12 ounces of beer, 5 ounces of wine, or 1.5 ounces of 80-proof liquor.  Limiting alcohol to less than one drink a day for women or two drinks a day for men can help lower blood pressure by about 4 mm Hg.   Please call me at [phone] with any questions.

## 2022-05-21 NOTE — Progress Notes (Unsigned)
Patient ID: [name]                 DOB: [birth year]                      MRN: [id number]      HPI: [name] is a 82 y.o. female referred by Dr. Radford Pax to HTN clinic. PMH is significant for hypertension, type 2 DM, hyperlipidemia, prior normal coronary angiograms 2012 and 2016, h/o nephrectomy, CKD III-IV, GERD, mild dementia. At last visit with Dr. Radford Pax, amlodipine was increased to 10mg  daily.   Patient presents today to hypertension clinic.  She only has a few her home blood pressure readings with her.  They are all elevated readings.  She states she is checking first thing in the morning, sitting in a chair with her arm resting on the table.  There does appear to be some memory issues at baseline.  She admits to some blurred vision and occasional dizziness.  Denies headache.  States her primary care doctor thinks that she has swelling but she does not think she does.  She was signed [place] by his PCP [place] [facility] [place].  Patient does not like this.  When her packs came yesterday she cut them all open and put them in her pillbox because she said that some of the medications that she takes in the evening her were put in the morning spots.   Has not been exercising.  States that she is lazy.  Also admits she went for a walk in the park about a month ago and fell therefore she is afraid she will fall again.  States she is too cool to use a cane or walker.  Eats out fairly often.  Family lives in [place].   Current HTN meds: amlodipine 10mg  daily, metoprolol succinate 100mg  daily, furosemide 10mg  daily Previously tried: lisinopril (CKD) BP goal: <140/80  Family History:  Family History  Problem Relation Age of Onset   Hypertension Mother    Coronary artery disease Other    Breast cancer Cousin     Social History: no tobacco, no alcohol  Diet: no salt on food Cooks some, mostly eats out ([name], [employer]) Tried to always include a vegetable Lamb chops,  seafood  Exercise: none  Home BP readings:  198/73, 158/74, 160/70, 158/69  Wt Readings from Last 3 Encounters:  05/03/22 143 lb (64.9 kg)  04/10/22 150 lb 6.4 oz (68.2 kg)  03/29/22 148 lb (67.1 kg)   BP Readings from Last 3 Encounters:  05/21/22 (!) 145/62  05/03/22 (!) 147/72  04/10/22 (!) 160/70   Pulse Readings from Last 3 Encounters:  05/21/22 (!) 55  05/03/22 66  04/10/22 68    Renal function: CrCl cannot be calculated (Patient's most recent lab result is older than the maximum 21 days allowed.).  Past Medical History:  Diagnosis Date   ACS (acute coronary syndrome) ([place]) 12/27/2010   AKI (acute kidney injury) ([place]) 03/25/2018   Anginal pain ([place])    Arthritis    Asthma    Diabetes mellitus    Type 1   Dysrhythmia    "irregular heart beat"   GERD (gastroesophageal reflux disease)    History of urinary tract infection    Hypercholesteremia    Hypertension    left renal ca dx'd 2012 (?)   surg only, left kidney   Nocturia    Reflux    Renal failure (ARF), acute  on chronic (HCC)    Shortness of breath dyspnea    pt denies; states can climb stairs w/o difficulty    Sleep apnea    does not use c-pap machine   Tingling in extremities    legs bilat   Tinnitus    Urinary frequency    Urinary incontinence    Vertigo    occurs when lying flat     Current Outpatient Medications on File Prior to Visit  Medication Sig Dispense Refill   acetaminophen (TYLENOL 8 HOUR) 650 MG CR tablet Take 1 tablet (650 mg total) by mouth every 8 (eight) hours as needed for pain. (Patient taking differently: Take 650 mg by mouth in the morning and at bedtime.) 15 tablet 0   albuterol (PROVENTIL,VENTOLIN) 90 MCG/ACT inhaler Inhale 2 puffs into the lungs every 4 (four) hours as needed for wheezing or shortness of breath.      amLODipine (NORVASC) 10 MG tablet Take 1 tablet (10 mg total) by mouth daily. 90 tablet 3   aspirin EC 81 MG tablet Take 81 mg by mouth at bedtime.      atorvastatin (LIPITOR) 10 MG tablet Take 10 mg by mouth every morning.     cholecalciferol (VITAMIN D) 1000 units tablet Take 1,000 Units by mouth daily.     donepezil (ARICEPT) 10 MG tablet Take 1 tablet (10 mg total) by mouth at bedtime. Must be seen for further refills. Call [phone]. 90 tablet 0   furosemide (LASIX) 20 MG tablet Take 10 mg by mouth daily.     insulin degludec (TRESIBA) 200 UNIT/ML FlexTouch Pen Inject 80 Units into the skin daily before breakfast.     metoprolol succinate (TOPROL-XL) 100 MG 24 hr tablet Take 1 tablet (100 mg total) by mouth daily. Take with or immediately following a meal. 90 tablet 3   mirabegron ER (MYRBETRIQ) 50 MG TB24 tablet Take 50 mg by mouth daily.     Multiple Vitamins-Minerals (CENTRUM SILVER ADULT 50+ PO) Take 1 tablet by mouth daily.     pantoprazole (PROTONIX) 40 MG tablet Take 1 tablet (40 mg total) by mouth daily. 30 tablet 0   gabapentin (NEURONTIN) 300 MG capsule Take 1 capsule (300 mg total) by mouth at bedtime. (Patient not taking: Reported on 05/21/2022) 90 capsule 0   lidocaine (LIDODERM) 5 % Place 1 patch onto the skin daily. Remove & Discard patch within 12 hours or as directed by MD (Patient not taking: Reported on 04/10/2022) 30 patch 0   No current facility-administered medications on file prior to visit.    Allergies  Allergen Reactions   Sulfa Antibiotics Itching    Blood pressure (!) 145/62, pulse (!) 55, SpO2 98 %.   Assessment/Plan:  1. Hypertension -  Hypertension Assessment: Blood pressure in clinic is slightly above goal of less than 140/80 due to patient's age and mild dementia Home readings are much higher but have not been able to assess patient's monitor or technique I recommended changing metoprolol to carvedilol but patient had just picked up metoprolol and did not want to make the change. Encouraged patient to increase her activity.  To walk with a cane for stability or to go to the [facility] and ride stationary  bike or walk the track  Plan: I have asked the patient to start checking her blood pressure once or twice a day a few hours after taking her medications I have asked patient to bring in her home blood pressure cuff and the readings  to her next visit at the end of the month Continue metoprolol succinate 100 mg daily, amlodipine 10 mg daily and furosemide 10 mg daily Blood pressure medication options are limited due to her nephrectomy and stage III-IV CKD Options in the future would be changing metoprolol to carvedilol 12.5 mg twice daily or adding hydralazine Follow-up in clinic in 3 weeks      Thank you  Ramond Dial, Pharm.D, BCPS, CPP [facility] of [facility] [address], [place], [place] [address]  Phone: [phone]; Fax: [phone]

## 2022-05-21 NOTE — Assessment & Plan Note (Signed)
Assessment: Blood pressure in clinic is slightly above goal of less than 140/80 due to patient's age and mild dementia Home readings are much higher but have not been able to assess patient's monitor or technique I recommended changing metoprolol to carvedilol but patient had just picked up metoprolol and did not want to make the change. Encouraged patient to increase her activity.  To walk with a cane for stability or to go to the [facility] and ride stationary bike or walk the track  Plan: I have asked the patient to start checking her blood pressure once or twice a day a few hours after taking her medications I have asked patient to bring in her home blood pressure cuff and the readings to her next visit at the end of the month Continue metoprolol succinate 100 mg daily, amlodipine 10 mg daily and furosemide 10 mg daily Blood pressure medication options are limited due to her nephrectomy and stage III-IV CKD Options in the future would be changing metoprolol to carvedilol 12.5 mg twice daily or adding hydralazine Follow-up in clinic in 3 weeks

## 2022-05-22 ENCOUNTER — Encounter (HOSPITAL_COMMUNITY): Payer: Self-pay

## 2022-05-22 ENCOUNTER — Encounter (HOSPITAL_COMMUNITY)
Admission: RE | Admit: 2022-05-22 | Discharge: 2022-05-22 | Disposition: A | Discharge status: Home / Self Care | Payer: Medicare PPO | Source: Ambulatory Visit | Attending: Cardiology | Admitting: Cardiology

## 2022-05-22 DIAGNOSIS — R079 Chest pain, unspecified: Secondary | ICD-10-CM | POA: Insufficient documentation

## 2022-05-22 DIAGNOSIS — R072 Precordial pain: Secondary | ICD-10-CM | POA: Insufficient documentation

## 2022-06-04 DIAGNOSIS — N390 Urinary tract infection, site not specified: Secondary | ICD-10-CM | POA: Diagnosis not present

## 2022-06-04 DIAGNOSIS — E1122 Type 2 diabetes mellitus with diabetic chronic kidney disease: Secondary | ICD-10-CM | POA: Diagnosis not present

## 2022-06-04 DIAGNOSIS — N2581 Secondary hyperparathyroidism of renal origin: Secondary | ICD-10-CM | POA: Diagnosis not present

## 2022-06-04 DIAGNOSIS — N189 Chronic kidney disease, unspecified: Secondary | ICD-10-CM | POA: Diagnosis not present

## 2022-06-04 DIAGNOSIS — D631 Anemia in chronic kidney disease: Secondary | ICD-10-CM | POA: Diagnosis not present

## 2022-06-04 DIAGNOSIS — C642 Malignant neoplasm of left kidney, except renal pelvis: Secondary | ICD-10-CM | POA: Diagnosis not present

## 2022-06-04 DIAGNOSIS — I129 Hypertensive chronic kidney disease with stage 1 through stage 4 chronic kidney disease, or unspecified chronic kidney disease: Secondary | ICD-10-CM | POA: Diagnosis not present

## 2022-06-04 DIAGNOSIS — N184 Chronic kidney disease, stage 4 (severe): Secondary | ICD-10-CM | POA: Diagnosis not present

## 2022-06-04 DIAGNOSIS — C641 Malignant neoplasm of right kidney, except renal pelvis: Secondary | ICD-10-CM | POA: Diagnosis not present

## 2022-06-10 DIAGNOSIS — E1122 Type 2 diabetes mellitus with diabetic chronic kidney disease: Secondary | ICD-10-CM | POA: Diagnosis not present

## 2022-06-10 DIAGNOSIS — N184 Chronic kidney disease, stage 4 (severe): Secondary | ICD-10-CM | POA: Diagnosis not present

## 2022-06-10 DIAGNOSIS — I10 Essential (primary) hypertension: Secondary | ICD-10-CM | POA: Diagnosis not present

## 2022-06-10 DIAGNOSIS — M150 Primary generalized (osteo)arthritis: Secondary | ICD-10-CM | POA: Diagnosis not present

## 2022-06-10 DIAGNOSIS — M25512 Pain in left shoulder: Secondary | ICD-10-CM | POA: Diagnosis not present

## 2022-06-12 ENCOUNTER — Encounter (INDEPENDENT_AMBULATORY_CARE_PROVIDER_SITE_OTHER): Payer: Medicare PPO | Admitting: Ophthalmology

## 2022-06-14 ENCOUNTER — Encounter (INDEPENDENT_AMBULATORY_CARE_PROVIDER_SITE_OTHER): Payer: Medicare PPO | Admitting: Ophthalmology

## 2022-06-14 DIAGNOSIS — I10 Essential (primary) hypertension: Secondary | ICD-10-CM | POA: Diagnosis not present

## 2022-06-14 DIAGNOSIS — Z794 Long term (current) use of insulin: Secondary | ICD-10-CM

## 2022-06-14 DIAGNOSIS — E113393 Type 2 diabetes mellitus with moderate nonproliferative diabetic retinopathy without macular edema, bilateral: Secondary | ICD-10-CM | POA: Diagnosis not present

## 2022-06-14 DIAGNOSIS — H43813 Vitreous degeneration, bilateral: Secondary | ICD-10-CM

## 2022-06-14 DIAGNOSIS — H35033 Hypertensive retinopathy, bilateral: Secondary | ICD-10-CM | POA: Diagnosis not present

## 2022-06-17 ENCOUNTER — Ambulatory Visit: Payer: Medicare PPO | Attending: Cardiovascular Disease | Admitting: Pharmacist

## 2022-06-17 ENCOUNTER — Encounter (HOSPITAL_COMMUNITY): Payer: Self-pay | Admitting: Cardiology

## 2022-06-17 VITALS — BP 154/62 | HR 60

## 2022-06-17 DIAGNOSIS — I10 Essential (primary) hypertension: Secondary | ICD-10-CM | POA: Diagnosis not present

## 2022-06-17 NOTE — Assessment & Plan Note (Signed)
Assessment: Blood pressure elevated above goal of less than 140/80 in clinic today Blood pressures at home better controlled, average 133/65 Patient just started exercising some.  Encouraged her to continue with this Home cuff has not been validated  Plan: Continue amlodipine 10 mg daily, metoprolol 100mg  daily and furosemide 10 mg daily Continue checking blood pressure at home Have asked patient to bring her blood pressure cuff to next visit to validate cuff Follow-up in 1 month

## 2022-06-17 NOTE — Progress Notes (Signed)
Patient ID: [name]                 DOB: 10-Apr-1940                      MRN: [id number]      HPI: [name] is a 82 y.o. female referred by Dr. Mayford Knife to HTN clinic. PMH is significant for hypertension, type 2 DM, hyperlipidemia, prior normal coronary angiograms 2012 and 2016, h/o nephrectomy, CKD III-IV, GERD, mild dementia. At last visit with Dr. Mayford Knife, amlodipine was increased to 10mg  daily.   At last visit with Pharm.D. for 05/21/22 blood pressure was 145/62.  Very limited home readings available.  Patient was not exercising.  Her PCP signed her up for pill pack but when they arrived at her house she cut them open and put them in her pillbox herself.  We recommended changing metoprolol to carvedilol but patient had just picked up metoprolol and did not want to make this change.  She was encouraged to go to the [facility] or walk using a cane.  I asked her to please bring in her home blood pressure cuff to next visit.  Patient presents today for follow-up.  She does bring in a list of blood pressure readings, but no blood pressure cuff.  She admits that her head feels swimmy, but does not feel like she is going to pass out.  Denies any headache, blurred vision or swelling.  She just started walking with a friend.  States they plan to walk 3 days a week.   Current HTN meds: amlodipine 10mg  daily, metoprolol succinate 100mg  daily, furosemide 10mg  daily Previously tried: lisinopril (CKD) BP goal: <140/80  Family History:  Family History  Problem Relation Age of Onset   Hypertension Mother    Coronary artery disease Other    Breast cancer Cousin     Social History: no tobacco, no alcohol  Diet: no salt on food Cooks some, mostly eats out ([name], [name]) Tried to always include a vegetable Lamb chops, seafood  Exercise: none  Home BP readings:  129/71, 132/63, 153/67, 135/62, 135/62, 123/67, 112/60, 140/70, 155/68, 125/64 average 133/65  Wt Readings from Last 3  Encounters:  05/03/22 143 lb (64.9 kg)  04/10/22 150 lb 6.4 oz (68.2 kg)  03/29/22 148 lb (67.1 kg)   BP Readings from Last 3 Encounters:  06/17/22 (!) 154/62  05/21/22 (!) 145/62  05/03/22 (!) 147/72   Pulse Readings from Last 3 Encounters:  06/17/22 60  05/21/22 (!) 55  05/03/22 66    Renal function: CrCl cannot be calculated (Patient's most recent lab result is older than the maximum 21 days allowed.).  Past Medical History:  Diagnosis Date   ACS (acute coronary syndrome) (HCC) 12/27/2010   AKI (acute kidney injury) (HCC) 03/25/2018   Anginal pain (HCC)    Arthritis    Asthma    Diabetes mellitus    Type 1   Dysrhythmia    "irregular heart beat"   GERD (gastroesophageal reflux disease)    History of urinary tract infection    Hypercholesteremia    Hypertension    left renal ca dx'd 2012 (?)   surg only, left kidney   Nocturia    Reflux    Renal failure (ARF), acute on chronic (HCC)    Shortness of breath dyspnea    pt denies; states can climb stairs w/o difficulty    Sleep apnea    does  not use c-pap machine   Tingling in extremities    legs bilat   Tinnitus    Urinary frequency    Urinary incontinence    Vertigo    occurs when lying flat     Current Outpatient Medications on File Prior to Visit  Medication Sig Dispense Refill   amLODipine (NORVASC) 10 MG tablet Take 1 tablet (10 mg total) by mouth daily. 90 tablet 3   aspirin EC 81 MG tablet Take 81 mg by mouth at bedtime.     cholecalciferol (VITAMIN D) 1000 units tablet Take 1,000 Units by mouth daily.     donepezil (ARICEPT) 10 MG tablet Take 1 tablet (10 mg total) by mouth at bedtime. Must be seen for further refills. Call [phone]. 90 tablet 0   furosemide (LASIX) 20 MG tablet Take 10 mg by mouth daily.     gabapentin (NEURONTIN) 300 MG capsule Take 1 capsule (300 mg total) by mouth at bedtime. 90 capsule 0   insulin degludec (TRESIBA) 200 UNIT/ML FlexTouch Pen Inject 80 Units into the skin daily  before breakfast.     metoprolol succinate (TOPROL-XL) 100 MG 24 hr tablet Take 1 tablet (100 mg total) by mouth daily. Take with or immediately following a meal. 90 tablet 3   mirabegron ER (MYRBETRIQ) 50 MG TB24 tablet Take 50 mg by mouth daily.     pantoprazole (PROTONIX) 40 MG tablet Take 1 tablet (40 mg total) by mouth daily. 30 tablet 0   acetaminophen (TYLENOL 8 HOUR) 650 MG CR tablet Take 1 tablet (650 mg total) by mouth every 8 (eight) hours as needed for pain. (Patient taking differently: Take 650 mg by mouth in the morning and at bedtime.) 15 tablet 0   albuterol (PROVENTIL,VENTOLIN) 90 MCG/ACT inhaler Inhale 2 puffs into the lungs every 4 (four) hours as needed for wheezing or shortness of breath.      atorvastatin (LIPITOR) 10 MG tablet Take 10 mg by mouth every morning. (Patient not taking: Reported on 06/17/2022)     lidocaine (LIDODERM) 5 % Place 1 patch onto the skin daily. Remove & Discard patch within 12 hours or as directed by MD (Patient not taking: Reported on 04/10/2022) 30 patch 0   Multiple Vitamins-Minerals (CENTRUM SILVER ADULT 50+ PO) Take 1 tablet by mouth daily.     No current facility-administered medications on file prior to visit.    Allergies  Allergen Reactions   Sulfa Antibiotics Itching    Blood pressure (!) 154/62, pulse 60, SpO2 98 %.   Assessment/Plan:  1. Hypertension -  Hypertension Assessment: Blood pressure elevated above goal of less than 140/80 in clinic today Blood pressures at home better controlled, average 133/65 Patient just started exercising some.  Encouraged her to continue with this Home cuff has not been validated  Plan: Continue amlodipine 10 mg daily, metoprolol 100mg  daily and furosemide 10 mg daily Continue checking blood pressure at home Have asked patient to bring her blood pressure cuff to next visit to validate cuff Follow-up in 1 month   Thank you  Olene Floss, Pharm.D, BCPS, CPP [facility]  Division of [facility] [address], [place], [place] [address]  Phone: [phone]; Fax: [phone]

## 2022-06-17 NOTE — Patient Instructions (Signed)
Summary of today's discussion  1.Continue amlodipine 10mg  daily, metoprolol succinate 100mg  daily, furosemide 10mg  daily  2. Please continue checking blood pressure at home  3.Please bring in your readings and your home blood pressure machine to your next visit  4.  5.   Your blood pressure goal is <140/80  To check your pressure at home you will need to:  1. Sit up in a chair, with feet flat on the floor and back supported. Do not cross your ankles or legs. 2. Rest your left arm so that the cuff is about heart level. If the cuff goes on your upper arm,  then just relax the arm on the table, arm of the chair or your lap. If you have a wrist cuff, we  suggest relaxing your wrist against your chest (think of it as Pledging the Flag with the  wrong arm).  3. Place the cuff snugly around your arm, about 1 inch above the crook of your elbow. The  cords should be inside the groove of your elbow.  4. Sit quietly, with the cuff in place, for about 5 minutes. After that 5 minutes press the power  button to start a reading. 5. Do not talk or move while the reading is taking place.  6. Record your readings on a sheet of paper. Although most cuffs have a memory, it is often  easier to see a pattern developing when the numbers are all in front of you.  7. You can repeat the reading after 1-3 minutes if it is recommended  Make sure your bladder is empty and you have not had caffeine or tobacco within the last 30 min  Always bring your blood pressure log with you to your appointments. If you have not brought your monitor in to be double checked for accuracy, please bring it to your next appointment.  You can find a list of validated (accurate) blood pressure cuffs at [email or web address]   Important lifestyle changes to control high blood pressure  Intervention  Effect on the BP  Lose extra pounds and watch your waistline Weight loss is one of the most effective lifestyle changes for controlling  blood pressure. If you're overweight or obese, losing even a small amount of weight can help reduce blood pressure. Blood pressure might go down by about 1 millimeter of mercury (mm Hg) with each kilogram (about 2.2 pounds) of weight lost.  Exercise regularly As a general goal, aim for at least 30 minutes of moderate physical activity every day. Regular physical activity can lower high blood pressure by about 5 to 8 mm Hg.  Eat a healthy diet Eating a diet rich in whole grains, fruits, vegetables, and low-fat dairy products and low in saturated fat and cholesterol. A healthy diet can lower high blood pressure by up to 11 mm Hg.  Reduce salt (sodium) in your diet Even a small reduction of sodium in the diet can improve heart health and reduce high blood pressure by about 5 to 6 mm Hg.  Limit alcohol One drink equals 12 ounces of beer, 5 ounces of wine, or 1.5 ounces of 80-proof liquor.  Limiting alcohol to less than one drink a day for women or two drinks a day for men can help lower blood pressure by about 4 mm Hg.   Please call me at [phone] with any questions.

## 2022-06-18 DIAGNOSIS — N184 Chronic kidney disease, stage 4 (severe): Secondary | ICD-10-CM | POA: Diagnosis not present

## 2022-06-18 DIAGNOSIS — E1122 Type 2 diabetes mellitus with diabetic chronic kidney disease: Secondary | ICD-10-CM | POA: Diagnosis not present

## 2022-06-18 DIAGNOSIS — Z794 Long term (current) use of insulin: Secondary | ICD-10-CM | POA: Diagnosis not present

## 2022-06-18 DIAGNOSIS — I129 Hypertensive chronic kidney disease with stage 1 through stage 4 chronic kidney disease, or unspecified chronic kidney disease: Secondary | ICD-10-CM | POA: Diagnosis not present

## 2022-07-01 ENCOUNTER — Other Ambulatory Visit: Payer: Self-pay | Admitting: Cardiology

## 2022-07-01 DIAGNOSIS — E1122 Type 2 diabetes mellitus with diabetic chronic kidney disease: Secondary | ICD-10-CM | POA: Diagnosis not present

## 2022-07-01 DIAGNOSIS — J209 Acute bronchitis, unspecified: Secondary | ICD-10-CM | POA: Diagnosis not present

## 2022-07-01 DIAGNOSIS — R001 Bradycardia, unspecified: Secondary | ICD-10-CM | POA: Diagnosis not present

## 2022-07-01 DIAGNOSIS — I10 Essential (primary) hypertension: Secondary | ICD-10-CM

## 2022-07-08 DIAGNOSIS — J209 Acute bronchitis, unspecified: Secondary | ICD-10-CM | POA: Diagnosis not present

## 2022-07-08 DIAGNOSIS — R001 Bradycardia, unspecified: Secondary | ICD-10-CM | POA: Diagnosis not present

## 2022-07-08 DIAGNOSIS — E1122 Type 2 diabetes mellitus with diabetic chronic kidney disease: Secondary | ICD-10-CM | POA: Diagnosis not present

## 2022-07-09 ENCOUNTER — Emergency Department (HOSPITAL_COMMUNITY)
Admission: EM | Admit: 2022-07-09 | Discharge: 2022-07-09 | Disposition: A | Discharge status: Home / Self Care | Payer: Medicare PPO | Attending: Emergency Medicine | Admitting: Emergency Medicine

## 2022-07-09 ENCOUNTER — Encounter (HOSPITAL_COMMUNITY): Payer: Self-pay

## 2022-07-09 ENCOUNTER — Emergency Department (HOSPITAL_COMMUNITY): Payer: Medicare PPO

## 2022-07-09 ENCOUNTER — Other Ambulatory Visit: Payer: Self-pay

## 2022-07-09 DIAGNOSIS — I129 Hypertensive chronic kidney disease with stage 1 through stage 4 chronic kidney disease, or unspecified chronic kidney disease: Secondary | ICD-10-CM | POA: Insufficient documentation

## 2022-07-09 DIAGNOSIS — R030 Elevated blood-pressure reading, without diagnosis of hypertension: Secondary | ICD-10-CM

## 2022-07-09 DIAGNOSIS — N189 Chronic kidney disease, unspecified: Secondary | ICD-10-CM | POA: Diagnosis not present

## 2022-07-09 DIAGNOSIS — Z7982 Long term (current) use of aspirin: Secondary | ICD-10-CM | POA: Diagnosis not present

## 2022-07-09 DIAGNOSIS — Z1152 Encounter for screening for COVID-19: Secondary | ICD-10-CM | POA: Diagnosis not present

## 2022-07-09 DIAGNOSIS — B9789 Other viral agents as the cause of diseases classified elsewhere: Secondary | ICD-10-CM | POA: Diagnosis not present

## 2022-07-09 DIAGNOSIS — R059 Cough, unspecified: Secondary | ICD-10-CM | POA: Diagnosis present

## 2022-07-09 DIAGNOSIS — J069 Acute upper respiratory infection, unspecified: Secondary | ICD-10-CM | POA: Diagnosis not present

## 2022-07-09 DIAGNOSIS — Z794 Long term (current) use of insulin: Secondary | ICD-10-CM | POA: Diagnosis not present

## 2022-07-09 DIAGNOSIS — R051 Acute cough: Secondary | ICD-10-CM

## 2022-07-09 DIAGNOSIS — I10 Essential (primary) hypertension: Secondary | ICD-10-CM

## 2022-07-09 DIAGNOSIS — Z79899 Other long term (current) drug therapy: Secondary | ICD-10-CM | POA: Diagnosis not present

## 2022-07-09 DIAGNOSIS — N184 Chronic kidney disease, stage 4 (severe): Secondary | ICD-10-CM | POA: Diagnosis not present

## 2022-07-09 DIAGNOSIS — R0602 Shortness of breath: Secondary | ICD-10-CM | POA: Diagnosis not present

## 2022-07-09 LAB — CBC WITH DIFFERENTIAL/PLATELET
Abs Immature Granulocytes: 0.02 10*3/uL (ref 0.00–0.07)
Basophils Absolute: 0.1 10*3/uL (ref 0.0–0.1)
Basophils Relative: 1 %
Eosinophils Absolute: 0.3 10*3/uL (ref 0.0–0.5)
Eosinophils Relative: 4 %
HCT: 36.8 % (ref 36.0–46.0)
Hemoglobin: 12.1 g/dL (ref 12.0–15.0)
Immature Granulocytes: 0 %
Lymphocytes Relative: 28 %
Lymphs Abs: 1.7 10*3/uL (ref 0.7–4.0)
MCH: 30.9 pg (ref 26.0–34.0)
MCHC: 32.9 g/dL (ref 30.0–36.0)
MCV: 93.9 fL (ref 80.0–100.0)
Monocytes Absolute: 0.6 10*3/uL (ref 0.1–1.0)
Monocytes Relative: 9 %
Neutro Abs: 3.6 10*3/uL (ref 1.7–7.7)
Neutrophils Relative %: 58 %
Platelets: 265 10*3/uL (ref 150–400)
RBC: 3.92 MIL/uL (ref 3.87–5.11)
RDW: 13.6 % (ref 11.5–15.5)
WBC: 6.2 10*3/uL (ref 4.0–10.5)
nRBC: 0 % (ref 0.0–0.2)

## 2022-07-09 LAB — COMPREHENSIVE METABOLIC PANEL
ALT: 15 U/L (ref 0–44)
AST: 17 U/L (ref 15–41)
Albumin: 3.6 g/dL (ref 3.5–5.0)
Alkaline Phosphatase: 77 U/L (ref 38–126)
Anion gap: 10 (ref 5–15)
BUN: 23 mg/dL (ref 8–23)
CO2: 22 mmol/L (ref 22–32)
Calcium: 8.9 mg/dL (ref 8.9–10.3)
Chloride: 103 mmol/L (ref 98–111)
Creatinine, Ser: 1.86 mg/dL — ABNORMAL HIGH (ref 0.44–1.00)
GFR, Estimated: 27 mL/min — ABNORMAL LOW (ref >60)
Glucose, Bld: 246 mg/dL — ABNORMAL HIGH (ref 70–99)
Potassium: 4.3 mmol/L (ref 3.5–5.1)
Sodium: 135 mmol/L (ref 135–145)
Total Bilirubin: 0.4 mg/dL (ref 0.3–1.2)
Total Protein: 7.4 g/dL (ref 6.5–8.1)

## 2022-07-09 LAB — RESP PANEL BY RT-PCR (RSV, FLU A&B, COVID)  RVPGX2
Influenza A by PCR: NEGATIVE
Influenza B by PCR: NEGATIVE
Resp Syncytial Virus by PCR: NEGATIVE
SARS Coronavirus 2 by RT PCR: NEGATIVE

## 2022-07-09 LAB — TROPONIN I (HIGH SENSITIVITY): Troponin I (High Sensitivity): 3 ng/L (ref <18)

## 2022-07-09 MED ORDER — ALBUTEROL SULFATE HFA 108 (90 BASE) MCG/ACT IN AERS: 2.0000 | INHALATION_SPRAY | RESPIRATORY_TRACT | Status: DC | PRN

## 2022-07-09 MED ORDER — GUAIFENESIN-DM 100-10 MG/5ML PO SYRP
10.0000 mL | ORAL_SOLUTION | Freq: Once | ORAL | Status: AC
  Administered 2022-07-09: 10 mL via ORAL
  Filled 2022-07-09: qty 10

## 2022-07-09 NOTE — ED Provider Notes (Addendum)
EMERGENCY DEPARTMENT AT [facility] Provider Note   CSN: [id number] Arrival date & time: 07/09/22  1944     History  Chief Complaint  Patient presents with   Cough    [name] is a 82 y.o. female.  Patient c/o cough, generally non prod but occasional greenish phlegm, in the past week. Mild nasal congestion. Sinus pain or headache. No sore throat or trouble swallowing. Body aches. Is just completing a five day course of antibiotic therapy for same. No specific known ill contacts, no known covid or flu exposure. Denies chest pain or discomfort. No sob. No abd pain or nvd. No extremity pain or swelling. No rash.   The history is provided by the patient and medical records.  Cough Associated symptoms: myalgias, rhinorrhea and shortness of breath   Associated symptoms: no chest pain, no chills, no fever, no headaches, no rash and no sore throat   Shortness of Breath Associated symptoms: cough   Associated symptoms: no abdominal pain, no chest pain, no fever, no headaches, no neck pain, no rash, no sore throat and no vomiting        Home Medications Prior to Admission medications   Medication Sig Start Date End Date Taking? Authorizing Provider  acetaminophen (TYLENOL 8 HOUR) 650 MG CR tablet Take 1 tablet (650 mg total) by mouth every 8 (eight) hours as needed for pain. Patient taking differently: Take 650 mg by mouth in the morning and at bedtime. 02/18/20   Mannie Stabile, PA-C  albuterol (PROVENTIL,VENTOLIN) 90 MCG/ACT inhaler Inhale 2 puffs into the lungs every 4 (four) hours as needed for wheezing or shortness of breath.     [provider]  amLODipine (NORVASC) 10 MG tablet Take 1 tablet (10 mg total) by mouth daily. 04/10/22   Quintella Reichert, MD  aspirin EC 81 MG tablet Take 81 mg by mouth at bedtime.    [provider]  atorvastatin (LIPITOR) 10 MG tablet Take 10 mg by mouth every morning. Patient not taking: Reported on  06/17/2022    [provider]  cholecalciferol (VITAMIN D) 1000 units tablet Take 1,000 Units by mouth daily.    [provider]  donepezil (ARICEPT) 10 MG tablet Take 1 tablet (10 mg total) by mouth at bedtime. Must be seen for further refills. Call [phone]. 12/27/20   Butch Penny, NP  furosemide (LASIX) 20 MG tablet Take 10 mg by mouth daily. 07/25/21   [provider]  gabapentin (NEURONTIN) 300 MG capsule Take 1 capsule (300 mg total) by mouth at bedtime. 07/16/16   Lenn Sink, DPM  insulin degludec (TRESIBA) 200 UNIT/ML FlexTouch Pen Inject 80 Units into the skin daily before breakfast.    [provider]  lidocaine (LIDODERM) 5 % Place 1 patch onto the skin daily. Remove & Discard patch within 12 hours or as directed by MD Patient not taking: Reported on 04/10/2022 03/29/22   Virgina Norfolk, DO  metoprolol succinate (TOPROL-XL) 100 MG 24 hr tablet Take 1 tablet (100 mg total) by mouth daily. Take with or immediately following a meal. 05/01/22   Bartram, Cornelious Bryant, MD  mirabegron ER (MYRBETRIQ) 50 MG TB24 tablet Take 50 mg by mouth daily. 06/18/21   [provider]  Multiple Vitamins-Minerals (CENTRUM SILVER ADULT 50+ PO) Take 1 tablet by mouth daily.    [provider]  pantoprazole (PROTONIX) 40 MG tablet Take 1 tablet (40 mg total) by mouth daily. 08/29/21  Horton, Mayer Masker, MD      Allergies    Sulfa antibiotics    Review of Systems   Review of Systems  Constitutional:  Negative for chills and fever.  HENT:  Positive for congestion and rhinorrhea. Negative for sore throat.   Eyes:  Negative for redness.  Respiratory:  Positive for cough and shortness of breath.   Cardiovascular:  Negative for chest pain and leg swelling.  Gastrointestinal:  Negative for abdominal pain, diarrhea and vomiting.  Genitourinary:  Negative for dysuria and flank pain.  Musculoskeletal:  Positive for myalgias. Negative for back pain and neck pain.   Skin:  Negative for rash.  Neurological:  Negative for headaches.  Hematological:  Does not bruise/bleed easily.  Psychiatric/Behavioral:  Negative for confusion.     Physical Exam Updated Vital Signs BP (!) 152/83   Pulse 65   Temp 98.4 F (36.9 C) (Oral)   Resp 15   Ht 1.499 m (4\' 11" )   Wt 66.2 kg   SpO2 98%   BMI 29.49 kg/m  Physical Exam  ED Results / Procedures / Treatments   Labs (all labs ordered are listed, but only abnormal results are displayed) Results for orders placed or performed during the hospital encounter of 07/09/22  Resp panel by RT-PCR (RSV, Flu A&B, Covid) Anterior Nasal Swab   Specimen: Anterior Nasal Swab  Result Value Ref Range   SARS Coronavirus 2 by RT PCR NEGATIVE NEGATIVE   Influenza A by PCR NEGATIVE NEGATIVE   Influenza B by PCR NEGATIVE NEGATIVE   Resp Syncytial Virus by PCR NEGATIVE NEGATIVE  Comprehensive metabolic panel  Result Value Ref Range   Sodium 135 135 - 145 mmol/L   Potassium 4.3 3.5 - 5.1 mmol/L   Chloride 103 98 - 111 mmol/L   CO2 22 22 - 32 mmol/L   Glucose, Bld 246 (H) 70 - 99 mg/dL   BUN 23 8 - 23 mg/dL   Creatinine, Ser 7.82 (H) 0.44 - 1.00 mg/dL   Calcium 8.9 8.9 - 95.6 mg/dL   Total Protein 7.4 6.5 - 8.1 g/dL   Albumin 3.6 3.5 - 5.0 g/dL   AST 17 15 - 41 U/L   ALT 15 0 - 44 U/L   Alkaline Phosphatase 77 38 - 126 U/L   Total Bilirubin 0.4 0.3 - 1.2 mg/dL   GFR, Estimated 27 (L) >60 mL/min   Anion gap 10 5 - 15  CBC with Differential  Result Value Ref Range   WBC 6.2 4.0 - 10.5 K/uL   RBC 3.92 3.87 - 5.11 MIL/uL   Hemoglobin 12.1 12.0 - 15.0 g/dL   HCT 21.3 08.6 - 57.8 %   MCV 93.9 80.0 - 100.0 fL   MCH 30.9 26.0 - 34.0 pg   MCHC 32.9 30.0 - 36.0 g/dL   RDW 46.9 62.9 - 52.8 %   Platelets 265 150 - 400 K/uL   nRBC 0.0 0.0 - 0.2 %   Neutrophils Relative % 58 %   Neutro Abs 3.6 1.7 - 7.7 K/uL   Lymphocytes Relative 28 %   Lymphs Abs 1.7 0.7 - 4.0 K/uL   Monocytes Relative 9 %   Monocytes Absolute 0.6  0.1 - 1.0 K/uL   Eosinophils Relative 4 %   Eosinophils Absolute 0.3 0.0 - 0.5 K/uL   Basophils Relative 1 %   Basophils Absolute 0.1 0.0 - 0.1 K/uL   Immature Granulocytes 0 %   Abs Immature Granulocytes 0.02 0.00 - 0.07  K/uL  Troponin I (High Sensitivity)  Result Value Ref Range   Troponin I (High Sensitivity) 3 <18 ng/L   DG Chest 2 View  Result Date: 07/09/2022 CLINICAL DATA:  shortness of breath EXAM: CHEST - 2 VIEW COMPARISON:  03/29/2022 FINDINGS: Lungs are clear. Heart size and mediastinal contours are within normal limits. Aortic Atherosclerosis (ICD10-170.0). No effusion. Cervical fixation hardware. IMPRESSION: No acute cardiopulmonary disease. Electronically Signed   By: Corlis Leak M.D.   On: 07/09/2022 20:31    EKG EKG Interpretation  Date/Time:  Tuesday Jul 09 2022 20:04:20 EDT Ventricular Rate:  63 PR Interval:  173 QRS Duration: 86 QT Interval:  415 QTC Calculation: 425 R Axis:   42 Text Interpretation: Sinus rhythm Nonspecific T wave abnormality Confirmed by Cathren Laine ([id number]) on 07/09/2022 9:01:29 PM  Radiology DG Chest 2 View  Result Date: 07/09/2022 CLINICAL DATA:  shortness of breath EXAM: CHEST - 2 VIEW COMPARISON:  03/29/2022 FINDINGS: Lungs are clear. Heart size and mediastinal contours are within normal limits. Aortic Atherosclerosis (ICD10-170.0). No effusion. Cervical fixation hardware. IMPRESSION: No acute cardiopulmonary disease. Electronically Signed   By: Corlis Leak M.D.   On: 07/09/2022 20:31    Procedures Procedures    Medications Ordered in ED Medications  albuterol (VENTOLIN HFA) 108 (90 Base) MCG/ACT inhaler 2 puff (has no administration in time range)    ED Course/ Medical Decision Making/ A&P                             Medical Decision Making Problems Addressed: Acute cough: acute illness or injury with systemic symptoms Elevated blood pressure reading: acute illness or injury Essential hypertension: chronic illness or injury  with exacerbation, progression, or side effects of treatment that poses a threat to life or bodily functions Stage 4 chronic kidney disease (HCC): chronic illness or injury with exacerbation, progression, or side effects of treatment that poses a threat to life or bodily functions Viral URI with cough: acute illness or injury with systemic symptoms that poses a threat to life or bodily functions  Amount and/or Complexity of Data Reviewed External Data Reviewed: notes. Labs: ordered. Decision-making details documented in ED Course. Radiology: ordered and independent interpretation performed. Decision-making details documented in ED Course. ECG/medicine tests: ordered and independent interpretation performed. Decision-making details documented in ED Course.  Risk OTC drugs. Prescription drug management. Decision regarding hospitalization.   Iv ns. Continuous pulse ox and cardiac monitoring. Labs ordered/sent. Imaging ordered.   Differential diagnosis includes pna, bronchitis, viral uri, covid, flu, etc. Dispo decision including potential need for admission considered - will get labs and imaging and reassess.   Reviewed nursing notes and prior charts for additional history. External reports reviewed.   Cardiac monitor: sinus rhythm, rate 66.  Labs reviewed/interpreted by me - wbc and hgb normal. Ckd.   Xrays reviewed/interpreted by me - no pna.  Pt currently is breathing comfortably, o2 sats 98%. Pt appears stable for d/c.   Rec close pcp f/u.  Return precautions provided.          Final Clinical Impression(s) / ED Diagnoses Final diagnoses:  None    Rx / DC Orders ED Discharge Orders     None          Cathren Laine, MD 07/09/22 2210

## 2022-07-09 NOTE — Discharge Instructions (Addendum)
It was our pleasure to provide your ER care today - we hope that you feel better.  Drink plenty of fluids/stay well hydrated. You may try myquil, mucinex, robitussin or similar over the counter cough, cold and flu medication for symptom relief.   Follow up with primary care doctor in the coming week if symptoms fail to improve/resolve. Also follow up closely with your doctor regarding your high blood pressure.  Regarding your chronic kidney disease, make sure to avoid taking nsaid meds such as ibuprofen/motrin or naprosyn/aleve, drink adequate fluids/stay well hydrated, control blood pressure, and follow up with your doctor in the coming week.  Return to ER if worse, new symptoms, high fevers,  recurrent or persistent chest pain, increased trouble breathing, or other emergency concern.

## 2022-07-09 NOTE — ED Triage Notes (Signed)
Pt arrives from home c/o productive cough and SOB with exertion since x1 week. States that she has also felt feverish/chills, pain in her chest when she coughs and headache. Went to her PCP on 5/13 and was placed on antibiotic and robitussin, then followed up with PCP yesterday but no new meds or treatment ordered at that time. Pt states that she just doesn't feel any better. Persistent cough during triage.

## 2022-07-16 ENCOUNTER — Telehealth (HOSPITAL_COMMUNITY): Payer: Self-pay | Admitting: *Deleted

## 2022-07-16 ENCOUNTER — Telehealth: Payer: Self-pay | Admitting: Cardiology

## 2022-07-16 ENCOUNTER — Ambulatory Visit: Payer: Medicare PPO | Attending: Cardiology | Admitting: Pharmacist

## 2022-07-16 VITALS — BP 160/70 | HR 55

## 2022-07-16 DIAGNOSIS — I10 Essential (primary) hypertension: Secondary | ICD-10-CM

## 2022-07-16 NOTE — Telephone Encounter (Signed)
New Message:      Patient's daughter called. She would like to talk to Dr Mayford Knife or her nurse. She would like to kind out what the patient condition is and what is she having tests for please.

## 2022-07-16 NOTE — Assessment & Plan Note (Signed)
Assessment: BP elevated in clinic today Patient visibly does not feel well I recommended she go home and rest and drink plenty of water. Call PCP if she doesn't feel better tomorrow.  Plan: Start checking BP at home once she feels better Bring BP cuff and readings to next visit Continue amlodipine 10mg  daily and metoprolol succinate 100mg  daily Follow up in 4 weeks

## 2022-07-16 NOTE — Telephone Encounter (Signed)
Reaching out to patient to offer assistance regarding upcoming cardiac imaging study; pt verbalizes understanding of appt date/time, parking situation and where to check in, pre-test NPO status, and verified current allergies; name and call back number provided for further questions should they arise  Mishti Swanton RN Navigator Cardiac Imaging [facility] [phone] office [phone] cell  Patient aware to avoid caffeine 12 hours prior to her cardiac PET scan.  

## 2022-07-16 NOTE — Progress Notes (Signed)
Patient ID: [name]                 DOB: 1940/06/11                      MRN: [id number]      HPI: [name] is a 82 y.o. female referred by [name] to HTN clinic. PMH is significant for hypertension, type 2 DM, hyperlipidemia, prior normal coronary angiograms 2012 and 2016, h/o nephrectomy, CKD III-IV, GERD, mild dementia. At last visit with [name], amlodipine was increased to 10mg  daily.   At visit with [name]. for 05/21/22 blood pressure was 145/62.  Very limited home readings available.  Patient was not exercising.  Her PCP signed her up for pill pack but when they arrived at her house she cut them open and put them in her pillbox herself.  We recommended changing metoprolol to carvedilol but patient had just picked up metoprolol and did not want to make this change.  She was encouraged to go to the [facility] or walk using a cane.  I asked her to please bring in her home blood pressure cuff to next visit.  At last visit 06/17/22, blood pressure as 154/62. BP at home averaged 133/65. She has just started walking with a friend. No medication changes were made.   Patient presents today to clinic. She reports that this AM she woke up and was feeling weak, nauseous, dizzy/lightheaded and had some diarrhea. Went out to dinner last night and had some cookies someone baked for her. She has drank 1 bottle of water today. Has a choir concert today- advised she not go. States she has a URI about 2 weeks ago, hasn't been out of the house since then until yesterday. Has not been checking BP and did not bring cuff.  Current HTN meds: amlodipine 10mg  daily, metoprolol succinate 100mg  daily, furosemide 10mg  daily Previously tried: lisinopril (CKD) BP goal: <140/80  Family History:  Family History  Problem Relation Age of Onset   Hypertension Mother    Coronary artery disease Other    Breast cancer Cousin     Social History: no tobacco, no alcohol  Diet: no salt on food Cooks some, mostly  eats out ([name]) Tried to always include a vegetable Lamb chops, seafood  Exercise: none  Home BP readings:  129/71, 132/63, 153/67, 135/62, 135/62, 123/67, 112/60, 140/70, 155/68, 125/64 average 133/65  Wt Readings from Last 3 Encounters:  07/09/22 146 lb (66.2 kg)  05/03/22 143 lb (64.9 kg)  04/10/22 150 lb 6.4 oz (68.2 kg)   BP Readings from Last 3 Encounters:  07/16/22 (!) 160/70  07/09/22 (!) 152/83  06/17/22 (!) 154/62   Pulse Readings from Last 3 Encounters:  07/16/22 (!) 55  07/09/22 65  06/17/22 60    Renal function: Estimated Creatinine Clearance: 19 mL/min (A) (by C-G formula based on SCr of 1.86 mg/dL (H)).  Past Medical History:  Diagnosis Date   ACS (acute coronary syndrome) (HCC) 12/27/2010   AKI (acute kidney injury) (HCC) 03/25/2018   Anginal pain (HCC)    Arthritis    Asthma    Diabetes mellitus    Type 1   Dysrhythmia    "irregular heart beat"   GERD (gastroesophageal reflux disease)    History of urinary tract infection    Hypercholesteremia    Hypertension    left renal ca dx'd 2012 (?)   surg only, left kidney  Nocturia    Reflux    Renal failure (ARF), acute on chronic (HCC)    Shortness of breath dyspnea    pt denies; states can climb stairs w/o difficulty    Sleep apnea    does not use c-pap machine   Tingling in extremities    legs bilat   Tinnitus    Urinary frequency    Urinary incontinence    Vertigo    occurs when lying flat     Current Outpatient Medications on File Prior to Visit  Medication Sig Dispense Refill   acetaminophen (TYLENOL 8 HOUR) 650 MG CR tablet Take 1 tablet (650 mg total) by mouth every 8 (eight) hours as needed for pain. (Patient taking differently: Take 650 mg by mouth in the morning and at bedtime.) 15 tablet 0   albuterol (PROVENTIL,VENTOLIN) 90 MCG/ACT inhaler Inhale 2 puffs into the lungs every 4 (four) hours as needed for wheezing or shortness of breath.      amLODipine  (NORVASC) 10 MG tablet Take 1 tablet (10 mg total) by mouth daily. 90 tablet 3   aspirin EC 81 MG tablet Take 81 mg by mouth at bedtime.     atorvastatin (LIPITOR) 10 MG tablet Take 10 mg by mouth every morning. (Patient not taking: Reported on 06/17/2022)     cholecalciferol (VITAMIN D) 1000 units tablet Take 1,000 Units by mouth daily.     donepezil (ARICEPT) 10 MG tablet Take 1 tablet (10 mg total) by mouth at bedtime. Must be seen for further refills. Call [phone]. 90 tablet 0   furosemide (LASIX) 20 MG tablet Take 10 mg by mouth daily.     gabapentin (NEURONTIN) 300 MG capsule Take 1 capsule (300 mg total) by mouth at bedtime. 90 capsule 0   insulin degludec (TRESIBA) 200 UNIT/ML FlexTouch Pen Inject 80 Units into the skin daily before breakfast.     lidocaine (LIDODERM) 5 % Place 1 patch onto the skin daily. Remove & Discard patch within 12 hours or as directed by MD (Patient not taking: Reported on 04/10/2022) 30 patch 0   metoprolol succinate (TOPROL-XL) 100 MG 24 hr tablet Take 1 tablet (100 mg total) by mouth daily. Take with or immediately following a meal. 90 tablet 3   mirabegron ER (MYRBETRIQ) 50 MG TB24 tablet Take 50 mg by mouth daily.     Multiple Vitamins-Minerals (CENTRUM SILVER ADULT 50+ PO) Take 1 tablet by mouth daily.     pantoprazole (PROTONIX) 40 MG tablet Take 1 tablet (40 mg total) by mouth daily. 30 tablet 0   No current facility-administered medications on file prior to visit.    Allergies  Allergen Reactions   Sulfa Antibiotics Itching    Blood pressure (!) 160/70, pulse (!) 55.   Assessment/Plan:  1. Hypertension -  Hypertension Assessment: BP elevated in clinic today Patient visibly does not feel well I recommended she go home and rest and drink plenty of water. Call PCP if she doesn't feel better tomorrow.  Plan: Start checking BP at home once she feels better Bring BP cuff and readings to next visit Continue amlodipine 10mg  daily and  metoprolol succinate 100mg  daily Follow up in 4 weeks    Thank you  Olene Floss, [name], BCPS, CPP [facility] [address], [place], [place] [address]  Phone: [phone]; Fax: [phone]

## 2022-07-16 NOTE — Telephone Encounter (Signed)
Spoke with patient's daughter, Marcelino Duster, to answer questions about her mother's condition and review upcoming appointments. She would like someone to follow up with her after the patient's cardiac PET to review results as well.  Advised I would forward to her provider's nurse to follow up after the scan is completed.

## 2022-07-16 NOTE — Patient Instructions (Signed)
Summary of today's discussion  Continue amlodipine 10mg  daily, metoprolol succinate 100mg  daily  2. Please bring blood pressure cuff and list of readings with you to your next appointment  3.  4.  5.   Your blood pressure goal is <130/80  To check your pressure at home you will need to:  1. Sit up in a chair, with feet flat on the floor and back supported. Do not cross your ankles or legs. 2. Rest your left arm so that the cuff is about heart level. If the cuff goes on your upper arm,  then just relax the arm on the table, arm of the chair or your lap. If you have a wrist cuff, we  suggest relaxing your wrist against your chest (think of it as Pledging the Flag with the  wrong arm).  3. Place the cuff snugly around your arm, about 1 inch above the crook of your elbow. The  cords should be inside the groove of your elbow.  4. Sit quietly, with the cuff in place, for about 5 minutes. After that 5 minutes press the power  button to start a reading. 5. Do not talk or move while the reading is taking place.  6. Record your readings on a sheet of paper. Although most cuffs have a memory, it is often  easier to see a pattern developing when the numbers are all in front of you.  7. You can repeat the reading after 1-3 minutes if it is recommended  Make sure your bladder is empty and you have not had caffeine or tobacco within the last 30 min  Always bring your blood pressure log with you to your appointments. If you have not brought your monitor in to be double checked for accuracy, please bring it to your next appointment.  You can find a list of validated (accurate) blood pressure cuffs at [email or web address]   Important lifestyle changes to control high blood pressure  Intervention  Effect on the BP  Lose extra pounds and watch your waistline Weight loss is one of the most effective lifestyle changes for controlling blood pressure. If you're overweight or obese, losing even a small amount  of weight can help reduce blood pressure. Blood pressure might go down by about 1 millimeter of mercury (mm Hg) with each kilogram (about 2.2 pounds) of weight lost.  Exercise regularly As a general goal, aim for at least 30 minutes of moderate physical activity every day. Regular physical activity can lower high blood pressure by about 5 to 8 mm Hg.  Eat a healthy diet Eating a diet rich in whole grains, fruits, vegetables, and low-fat dairy products and low in saturated fat and cholesterol. A healthy diet can lower high blood pressure by up to 11 mm Hg.  Reduce salt (sodium) in your diet Even a small reduction of sodium in the diet can improve heart health and reduce high blood pressure by about 5 to 6 mm Hg.  Limit alcohol One drink equals 12 ounces of beer, 5 ounces of wine, or 1.5 ounces of 80-proof liquor.  Limiting alcohol to less than one drink a day for women or two drinks a day for men can help lower blood pressure by about 4 mm Hg.   Please call me at [phone] with any questions.

## 2022-07-16 NOTE — Telephone Encounter (Signed)
I did not need this encounter, had sent it earlier

## 2022-07-17 ENCOUNTER — Ambulatory Visit (HOSPITAL_COMMUNITY)
Admission: RE | Admit: 2022-07-17 | Discharge: 2022-07-17 | Disposition: A | Discharge status: Home / Self Care | Payer: Medicare PPO | Source: Ambulatory Visit | Attending: Cardiology | Admitting: Cardiology

## 2022-07-17 ENCOUNTER — Telehealth: Payer: Self-pay

## 2022-07-17 DIAGNOSIS — R072 Precordial pain: Secondary | ICD-10-CM | POA: Diagnosis not present

## 2022-07-17 DIAGNOSIS — I700 Atherosclerosis of aorta: Secondary | ICD-10-CM | POA: Diagnosis not present

## 2022-07-17 LAB — NM PET CT CARDIAC PERFUSION MULTI W/ABSOLUTE BLOODFLOW
LV dias vol: 67 mL (ref 46–106)
LV sys vol: 18 mL
MBFR: 2.24
Nuc Rest EF: 67 %
Nuc Stress EF: 73 %
Peak HR: 75 {beats}/min
Rest HR: 54 {beats}/min
Rest MBF: 0.89 ml/g/min
Rest Nuclear Isotope Dose: 17.7 mCi
ST Depression (mm): 0 mm
Stress MBF: 1.99 ml/g/min
Stress Nuclear Isotope Dose: 17.7 mCi
TID: 1.06

## 2022-07-17 LAB — GLUCOSE, CAPILLARY: Glucose-Capillary: 166 mg/dL — ABNORMAL HIGH (ref 70–99)

## 2022-07-17 MED ORDER — REGADENOSON 0.4 MG/5ML IV SOLN
0.4000 mg | Freq: Once | INTRAVENOUS | Status: AC
  Administered 2022-07-17: 0.4 mg via INTRAVENOUS

## 2022-07-17 MED ORDER — RUBIDIUM RB82 GENERATOR (RUBYFILL)
17.7000 | PACK | Freq: Once | INTRAVENOUS | Status: AC
  Administered 2022-07-17: 17.7 via INTRAVENOUS

## 2022-07-17 MED ORDER — REGADENOSON 0.4 MG/5ML IV SOLN
INTRAVENOUS | Status: AC
  Filled 2022-07-17: qty 5

## 2022-07-17 NOTE — Telephone Encounter (Signed)
Advised patient of normal stress test, patient verbalized understanding.

## 2022-07-17 NOTE — Telephone Encounter (Signed)
-----   Message from Meriam Sprague, MD sent at 07/17/2022 11:30 AM EDT ----- Her stress test is normal with normal blood flow. This is great news!

## 2022-07-17 NOTE — Telephone Encounter (Signed)
Called to discuss stress/PET/CT results w/ dtr, no answer, left message asking her to call office.

## 2022-07-19 DIAGNOSIS — E1122 Type 2 diabetes mellitus with diabetic chronic kidney disease: Secondary | ICD-10-CM | POA: Diagnosis not present

## 2022-07-19 DIAGNOSIS — N184 Chronic kidney disease, stage 4 (severe): Secondary | ICD-10-CM | POA: Diagnosis not present

## 2022-07-19 DIAGNOSIS — I129 Hypertensive chronic kidney disease with stage 1 through stage 4 chronic kidney disease, or unspecified chronic kidney disease: Secondary | ICD-10-CM | POA: Diagnosis not present

## 2022-08-12 DIAGNOSIS — E1165 Type 2 diabetes mellitus with hyperglycemia: Secondary | ICD-10-CM | POA: Diagnosis not present

## 2022-08-12 DIAGNOSIS — J209 Acute bronchitis, unspecified: Secondary | ICD-10-CM | POA: Diagnosis not present

## 2022-08-12 DIAGNOSIS — E1169 Type 2 diabetes mellitus with other specified complication: Secondary | ICD-10-CM | POA: Diagnosis not present

## 2022-08-13 ENCOUNTER — Ambulatory Visit: Payer: Medicare PPO | Attending: Cardiology | Admitting: Pharmacist

## 2022-08-13 VITALS — BP 140/60 | HR 96

## 2022-08-13 DIAGNOSIS — I10 Essential (primary) hypertension: Secondary | ICD-10-CM | POA: Diagnosis not present

## 2022-08-13 MED ORDER — CARVEDILOL 12.5 MG PO TABS: 12.5000 mg | ORAL_TABLET | Freq: Two times a day (BID) | ORAL | 3 refills | Status: DC

## 2022-08-13 NOTE — Patient Instructions (Addendum)
Please purchase a new blood pressure cuff. [facility] sell an OMRON blood pressure cuff for ~$30  STOP taking metoprolol START taking carvedilol 12.5mg  twice a day. I will call [facility] to make these changes with your med packs.  Please call your primary care doctor about your fasting blood sugars. I think you need to call your primary care doctor to discuss decreasing your tresiba.   Continue checking blood pressure at home  Continue walking with your friend. Increase the amount if you are able.

## 2022-08-13 NOTE — Assessment & Plan Note (Addendum)
Assessment: Blood pressure elevated in clinic today Home blood pressure readings, if adjusted for inaccurate cuff are also above goal Advised patient to get new cuff. Recommended OMRON. She can get one at [facility] pharmacy for ~30 Patient denies any missed doses. She uses [facility] pill packs, but does not seem to like it Eats out often reports walking with a friend in the park 1-2 times a week She does have a dx of mild dementia. I am a little concerned if she is taking her medications correctly  Plan: Stop metoprolol Start carvedilol 12.5mg  twice a day I called [facility] to let them know the changes. She is due for new pill packs soon Encouraged her to continue to walk and increase frequency if able. She does not feel comfortable walking alone (reports she fell once) Purchase new BP cuff and continue to check at home. I advised she speak with PCP about decreasing insulin. I will also call them and let them know she is having lows as she said she did not give them her blood sugar readings Follow up in 1 month

## 2022-08-13 NOTE — Progress Notes (Signed)
Patient ID: HARUE PRIBBLE                 DOB: 1940-07-24                      MRN: [id number]      HPI: [name] is a 82 y.o. female referred by Dr. Mayford Knife to HTN clinic. PMH is significant for hypertension, type 2 DM, hyperlipidemia, prior normal coronary angiograms 2012 and 2016, h/o nephrectomy, CKD III-IV, GERD, mild dementia. At last visit with Dr. Mayford Knife, amlodipine was increased to 10mg  daily.   At visit with Pharm.D. for 05/21/22 blood pressure was 145/62.  Very limited home readings available.  Patient was not exercising.  Her PCP signed her up for pill pack but when they arrived at her house she cut them open and put them in her pillbox herself.  We recommended changing metoprolol to carvedilol but patient had just picked up metoprolol and did not want to make this change.  She was encouraged to go to the [facility] or walk using a cane.  I asked her to please bring in her home blood pressure cuff to next visit.  At visit 06/17/22, blood pressure as 154/62. BP at home averaged 133/65. She has just started walking with a friend. No medication changes were made. I saw her again 5/28, however patient was not feeling well that day (diarrhea, nausea). She also had had a URI a few weeks prior and had not been checking her blood pressure.   Patient presents today to clinic. She brings in her home BP cuff and readings. Home BP cuff found to be inaccurate. She also brings in some fasting blood sugars. Some of these are low, one as low as 55. She did treat appropriately. She reports this was the AM of her PCP apt. After correcting for low BG her BG was 200 in his office and he increased her [place]. I advised that her long acting insulin really needs to be lowered if she is having lows.   Home cuff: 124/64 Clinic manual cuff: 140/60 Home cuff: 120/62 Clinic automatic cuff: 151/60  BG 87, 98, 92, 107, 109, 86, 90, 101, 135, 100, 55  Current HTN meds: amlodipine 10mg  daily, metoprolol succinate  100mg  daily, furosemide 10mg  daily Previously tried: lisinopril (CKD) BP goal: <140/80  Family History:  Family History  Problem Relation Age of Onset   Hypertension Mother    Coronary artery disease Other    Breast cancer Cousin     Social History: no tobacco, no alcohol  Diet: no salt on food Cooks some, mostly eats out ([name], [name]) Tried to always include a vegetable Lamb chops, seafood  Exercise: none  Home BP readings:  BP cuff inaccurate 156 73  140 69  125 62  140 72  143 76  136 64  155 79  140 64  137 64  183 77  173 73  180 72  127  71  154 69  149.2143 70.35714    Wt Readings from Last 3 Encounters:  07/09/22 146 lb (66.2 kg)  05/03/22 143 lb (64.9 kg)  04/10/22 150 lb 6.4 oz (68.2 kg)   BP Readings from Last 3 Encounters:  08/13/22 (!) 140/60  07/17/22 (!) 140/52  07/16/22 (!) 160/70   Pulse Readings from Last 3 Encounters:  08/13/22 96  07/17/22 64  07/16/22 (!) 55    Renal function: CrCl cannot be calculated (Patient's most recent lab  result is older than the maximum 21 days allowed.).  Past Medical History:  Diagnosis Date   ACS (acute coronary syndrome) (HCC) 12/27/2010   AKI (acute kidney injury) (HCC) 03/25/2018   Anginal pain (HCC)    Arthritis    Asthma    Diabetes mellitus    Type 1   Dysrhythmia    "irregular heart beat"   GERD (gastroesophageal reflux disease)    History of urinary tract infection    Hypercholesteremia    Hypertension    left renal ca dx'd 2012 (?)   surg only, left kidney   Nocturia    Reflux    Renal failure (ARF), acute on chronic (HCC)    Shortness of breath dyspnea    pt denies; states can climb stairs w/o difficulty    Sleep apnea    does not use c-pap machine   Tingling in extremities    legs bilat   Tinnitus    Urinary frequency    Urinary incontinence    Vertigo    occurs when lying flat     Current Outpatient Medications on File Prior to Visit  Medication Sig  Dispense Refill   acetaminophen (TYLENOL 8 HOUR) 650 MG CR tablet Take 1 tablet (650 mg total) by mouth every 8 (eight) hours as needed for pain. (Patient taking differently: Take 650 mg by mouth in the morning and at bedtime.) 15 tablet 0   albuterol (PROVENTIL,VENTOLIN) 90 MCG/ACT inhaler Inhale 2 puffs into the lungs every 4 (four) hours as needed for wheezing or shortness of breath.      amLODipine (NORVASC) 10 MG tablet Take 1 tablet (10 mg total) by mouth daily. 90 tablet 3   aspirin EC 81 MG tablet Take 81 mg by mouth at bedtime.     atorvastatin (LIPITOR) 10 MG tablet Take 10 mg by mouth every morning. (Patient not taking: Reported on 06/17/2022)     cholecalciferol (VITAMIN D) 1000 units tablet Take 1,000 Units by mouth daily.     donepezil (ARICEPT) 10 MG tablet Take 1 tablet (10 mg total) by mouth at bedtime. Must be seen for further refills. Call [phone]. 90 tablet 0   furosemide (LASIX) 20 MG tablet Take 10 mg by mouth daily.     gabapentin (NEURONTIN) 300 MG capsule Take 1 capsule (300 mg total) by mouth at bedtime. 90 capsule 0   insulin degludec (TRESIBA) 200 UNIT/ML FlexTouch Pen Inject 80 Units into the skin daily before breakfast.     lidocaine (LIDODERM) 5 % Place 1 patch onto the skin daily. Remove & Discard patch within 12 hours or as directed by MD (Patient not taking: Reported on 04/10/2022) 30 patch 0   mirabegron ER (MYRBETRIQ) 50 MG TB24 tablet Take 50 mg by mouth daily.     Multiple Vitamins-Minerals (CENTRUM SILVER ADULT 50+ PO) Take 1 tablet by mouth daily.     pantoprazole (PROTONIX) 40 MG tablet Take 1 tablet (40 mg total) by mouth daily. 30 tablet 0   TRADJENTA 5 MG TABS tablet Take 5 mg by mouth daily.     No current facility-administered medications on file prior to visit.    Allergies  Allergen Reactions   Sulfa Antibiotics Itching    Blood pressure (!) 140/60, pulse 96.   Assessment/Plan:  1. Hypertension -  Hypertension Assessment: Blood  pressure elevated in clinic today Home blood pressure readings, if adjusted for inaccurate cuff are also above goal Advised patient to get new cuff. Recommended OMRON.  She can get one at [facility] pharmacy for ~30 Patient denies any missed doses. She uses [facility] pill packs, but does not seem to like it Eats out often reports walking with a friend in the park 1-2 times a week She does have a dx of mild dementia. I am a little concerned if she is taking her medications correctly  Plan: Stop metoprolol Start carvedilol 12.5mg  twice a day I called [facility] to let them know the changes. She is due for new pill packs soon Encouraged her to continue to walk and increase frequency if able. She does not feel comfortable walking alone (reports she fell once) Purchase new BP cuff and continue to check at home. I advised she speak with PCP about decreasing insulin. I will also call them and let them know she is having lows as she said she did not give them her blood sugar readings Follow up in 1 month      Thank you  [name], Pharm.D, BCPS, CPP [facility] of [facility] [address], [place], [place] [address]  Phone: [phone]; Fax: [phone]

## 2022-08-14 ENCOUNTER — Telehealth: Payer: Self-pay | Admitting: Cardiology

## 2022-08-14 NOTE — Telephone Encounter (Signed)
Pt c/o medication issue:   [name] Pharmacist from [facility] called. She wanted to know which medicine is patient supposed to be taking?  1. Name of Medication: Metoprolol and Carvedilol  2. How are you currently taking this medication (dosage and times per day)?   3. Are you having a reaction (difficulty breathing--STAT)?   4. What is your medication issue? Which medicine is patient supposed to be taking

## 2022-08-14 NOTE — Telephone Encounter (Signed)
Called and spoke to pharmacist, reviewed M. Maccia's note from hypertension clinic yesterday in which metoprolol was stopped and carvedilol was started. Pharmacist Vallie verbalizes understanding.

## 2022-08-18 ENCOUNTER — Other Ambulatory Visit: Payer: Self-pay | Admitting: Cardiology

## 2022-08-18 DIAGNOSIS — E1122 Type 2 diabetes mellitus with diabetic chronic kidney disease: Secondary | ICD-10-CM | POA: Diagnosis not present

## 2022-08-18 DIAGNOSIS — I129 Hypertensive chronic kidney disease with stage 1 through stage 4 chronic kidney disease, or unspecified chronic kidney disease: Secondary | ICD-10-CM | POA: Diagnosis not present

## 2022-08-18 DIAGNOSIS — I10 Essential (primary) hypertension: Secondary | ICD-10-CM

## 2022-08-18 DIAGNOSIS — N184 Chronic kidney disease, stage 4 (severe): Secondary | ICD-10-CM | POA: Diagnosis not present

## 2022-08-23 ENCOUNTER — Other Ambulatory Visit: Payer: Self-pay | Admitting: Cardiology

## 2022-08-23 DIAGNOSIS — I10 Essential (primary) hypertension: Secondary | ICD-10-CM

## 2022-09-10 ENCOUNTER — Telehealth: Payer: Self-pay | Admitting: Pharmacist

## 2022-09-10 ENCOUNTER — Ambulatory Visit: Payer: Medicare PPO | Attending: Cardiology | Admitting: Pharmacist

## 2022-09-10 VITALS — BP 138/60 | HR 53

## 2022-09-10 DIAGNOSIS — Z794 Long term (current) use of insulin: Secondary | ICD-10-CM | POA: Diagnosis not present

## 2022-09-10 DIAGNOSIS — E11649 Type 2 diabetes mellitus with hypoglycemia without coma: Secondary | ICD-10-CM | POA: Diagnosis not present

## 2022-09-10 DIAGNOSIS — I10 Essential (primary) hypertension: Secondary | ICD-10-CM | POA: Diagnosis not present

## 2022-09-10 NOTE — Patient Instructions (Addendum)
Your blood pressure goal is < 140/29mmHg   Please call me when you get home and let me know if you are taking carvedilol or metoprolol. [phone]  Please decrease your [name] to 68 units  Important lifestyle changes to control high blood pressure  Intervention  Effect on the BP   Weight loss Weight loss is one of the most effective lifestyle changes for controlling blood pressure. If you're overweight or obese, losing even a small amount of weight can help reduce blood pressure.    Blood pressure can decrease by 1 millimeter of mercury (mmHg) with each kilogram (about 2.2 pounds) of weight lost.   Exercise regularly As a general goal, aim for 30 minutes of moderate physical activity every day.    Regular physical activity can lower blood pressure by 5 - 8 mmHg.   Eat a healthy diet Eat a diet rich in whole grains, fruits, vegetables, lean meat, and low-fat dairy products. Limit processed foods, saturated fat, and sweets.    A heart-healthy diet can lower high blood pressure by 10 mmHg.   Reduce salt (sodium) in your diet Aim for 000mg  of sodium each day. Avoid deli meats, canned food, and frozen microwave meals which are high in sodium.     Limiting sodium can reduce blood pressure by 5 mmHg.   Limit alcohol One drink equals 12 ounces of beer, 5 ounces of wine, or 1.5 ounces of 80-proof liquor.    Limiting alcohol to < 1 drink a day for women or < 2 drinks a day for men can help lower blood pressure by about 4 mmHg.   To check your pressure at home you will need to:   Sit up in a chair, with feet flat on the floor and back supported. Do not cross your ankles or legs. Rest your left arm so that the cuff is about heart level. If the cuff goes on your upper arm, then just relax your arm on the table, arm of the chair, or your lap. If you have a wrist cuff, hold your wrist against your chest at heart level. Place the cuff snugly around your arm, about 1 inch above the  crease of your elbow. The cords should be inside the groove of your elbow.  Sit quietly, with the cuff in place, for about 5 minutes. Then press the power button to start a reading. Do not talk or move while the reading is taking place.  Record your readings on a sheet of paper. Although most cuffs have a memory, it is often easier to see a pattern developing when the numbers are all in front of you.  You can repeat the reading after 1-3 minutes if it is recommended.   Make sure your bladder is empty and you have not had caffeine or tobacco within the last 30 minutes   Always bring your blood pressure log with you to your appointments. If you have not brought your monitor in to be double checked for accuracy, please bring it to your next appointment.   You can find a list of validated (accurate) blood pressure cuffs at: validatebp.org

## 2022-09-10 NOTE — Assessment & Plan Note (Signed)
Assessment: Once patient rested for a while her blood pressure in clinic was at goal of less than 140/80 She is checking her blood pressure at home first thing in the morning and is not resting She did demonstrate fairly good technique with putting cuff on Unclear whether she was taking both metoprolol and carvedilol and what strength of carvedilol she is taking  Plan: I have asked patient to call me when she gets home and looks at her medications to clarify what exactly she is taking Concerned she may be taking both beta-blockers as her heart rate was in the low 50s I have scheduled her follow-up in clinic in about 3 weeks Encourage patient to make sure that she rests at least 5 minutes prior to checking

## 2022-09-10 NOTE — Progress Notes (Signed)
Patient ID: [name]                 DOB: [birth year]                      MRN: [id number]      HPI: [name] is a 82 y.o. female referred by Dr. Mayford Knife to HTN clinic. PMH is significant for hypertension, type 2 DM, hyperlipidemia, prior normal coronary angiograms 2012 and 2016, h/o nephrectomy, CKD III-IV, GERD, mild dementia. At last visit with Dr. Mayford Knife, amlodipine was increased to 10mg  daily.   At visit with Pharm.D. for 05/21/22 blood pressure was 145/62.  Very limited home readings available.  Patient was not exercising.  Her PCP signed her up for pill pack but when they arrived at her house she cut them open and put them in her pillbox herself.  We recommended changing metoprolol to carvedilol but patient had just picked up metoprolol and did not want to make this change.  She was encouraged to go to the [facility] or walk using a cane.  I asked her to please bring in her home blood pressure cuff to next visit.  At visit 06/17/22, blood pressure as 154/62. BP at home averaged 133/65. She has just started walking with a friend. No medication changes were made. I saw her again 5/28, however patient was not feeling well that day (diarrhea, nausea). She also had had a URI a few weeks prior and had not been checking her blood pressure.   Patient last seen 6/25. Blood pressure was 140/60. Her metoprolol was stopped and carvedilol 12.5mg  twice a day was started. Her home cuff was also found to be inaccurate and was asked to purchase a new one. [facility] was updated on medication change. She had some low blood sugars and I advised that she speak with her PCP about this. I also called and left message at her PCP office.   Patient presents today to Pharm.D. clinic.  She brings in about 7-10 blood pressure readings.  They range from the 140s to the 160s over 70s.  Blood sugars still in the 60s and 70s with 1 of 100 and 2 in the morning after she ate cake.  Still taking Tresiba 72 units per her report.   Patient is not resting prior to checking blood pressure.  She did bring in a new blood pressure cuff Omron that has been found to be accurate.  She does follow with a pharmacist at her PCPs office but cannot really tell me what she does.  At first patient tells me that she is not taking carvedilol however it appears [facility] has been filled by [facility].  She does not want to use [facility] in the prepack because they did not cash her check quick enough.  Later she shows me a list of medications she is taking in which both carvedilol and metoprolol are on.  However on her list it states she is taking 6.25 mg of carvedilol and not the 12.5 mg we had prescribed.  Patient describes the inability to calm down.  Not sure why her primary care doctor told her she could not take on more choirs.    1st home reading 151/73 2nd home reading 152/77 1st clinic reading 138/60 3rd home reading 139/70  Current HTN meds: amlodipine 10mg  daily, carvedilol 12.5mg  twice a day, furosemide 10mg  daily Previously tried: lisinopril (CKD) BP goal: <140/80  Family History:  Family History  Problem Relation  Age of Onset   Hypertension Mother    Coronary artery disease Other    Breast cancer Cousin     Social History: no tobacco, no alcohol  Diet: no salt on food Cooks some, mostly eats out ([name], [name]) Tried to always include a vegetable Lamb chops, seafood  Exercise: none  Home BP readings: Patient not resting prior to checking.  Blood pressures range from 140s to the 160s with 1 or 2 higher than that   Wt Readings from Last 3 Encounters:  07/09/22 146 lb (66.2 kg)  05/03/22 143 lb (64.9 kg)  04/10/22 150 lb 6.4 oz (68.2 kg)   BP Readings from Last 3 Encounters:  09/10/22 138/60  08/13/22 (!) 140/60  07/17/22 (!) 140/52   Pulse Readings from Last 3 Encounters:  09/10/22 (!) 53  08/13/22 96  07/17/22 64    Renal function: CrCl cannot be calculated (Patient's most recent lab result is older  than the maximum 21 days allowed.).  Past Medical History:  Diagnosis Date   ACS (acute coronary syndrome) (HCC) 12/27/2010   AKI (acute kidney injury) (HCC) 03/25/2018   Anginal pain (HCC)    Arthritis    Asthma    Diabetes mellitus    Type 1   Dysrhythmia    "irregular heart beat"   GERD (gastroesophageal reflux disease)    History of urinary tract infection    Hypercholesteremia    Hypertension    left renal ca dx'd 2012 (?)   surg only, left kidney   Nocturia    Reflux    Renal failure (ARF), acute on chronic (HCC)    Shortness of breath dyspnea    pt denies; states can climb stairs w/o difficulty    Sleep apnea    does not use c-pap machine   Tingling in extremities    legs bilat   Tinnitus    Urinary frequency    Urinary incontinence    Vertigo    occurs when lying flat     Current Outpatient Medications on File Prior to Visit  Medication Sig Dispense Refill   acetaminophen (TYLENOL 8 HOUR) 650 MG CR tablet Take 1 tablet (650 mg total) by mouth every 8 (eight) hours as needed for pain. (Patient taking differently: Take 650 mg by mouth in the morning and at bedtime.) 15 tablet 0   albuterol (PROVENTIL,VENTOLIN) 90 MCG/ACT inhaler Inhale 2 puffs into the lungs every 4 (four) hours as needed for wheezing or shortness of breath.      amLODipine (NORVASC) 10 MG tablet Take 1 tablet (10 mg total) by mouth daily. 90 tablet 3   aspirin EC 81 MG tablet Take 81 mg by mouth at bedtime.     atorvastatin (LIPITOR) 10 MG tablet Take 10 mg by mouth every morning. (Patient not taking: Reported on 06/17/2022)     carvedilol (COREG) 12.5 MG tablet Take 1 tablet (12.5 mg total) by mouth 2 (two) times daily. 180 tablet 3   cholecalciferol (VITAMIN D) 1000 units tablet Take 1,000 Units by mouth daily.     donepezil (ARICEPT) 10 MG tablet Take 1 tablet (10 mg total) by mouth at bedtime. Must be seen for further refills. Call [phone]. 90 tablet 0   furosemide (LASIX) 20 MG tablet Take 10  mg by mouth daily.     gabapentin (NEURONTIN) 300 MG capsule Take 1 capsule (300 mg total) by mouth at bedtime. 90 capsule 0   insulin degludec (TRESIBA) 200 UNIT/ML FlexTouch Pen  Inject 80 Units into the skin daily before breakfast.     lidocaine (LIDODERM) 5 % Place 1 patch onto the skin daily. Remove & Discard patch within 12 hours or as directed by MD (Patient not taking: Reported on 04/10/2022) 30 patch 0   mirabegron ER (MYRBETRIQ) 50 MG TB24 tablet Take 50 mg by mouth daily.     Multiple Vitamins-Minerals (CENTRUM SILVER ADULT 50+ PO) Take 1 tablet by mouth daily.     pantoprazole (PROTONIX) 40 MG tablet Take 1 tablet (40 mg total) by mouth daily. 30 tablet 0   TRADJENTA 5 MG TABS tablet Take 5 mg by mouth daily.     No current facility-administered medications on file prior to visit.    Allergies  Allergen Reactions   Sulfa Antibiotics Itching    Blood pressure 138/60, pulse (!) 53.   Assessment/Plan:  1. Hypertension -  Hypertension Assessment: Once patient rested for a while her blood pressure in clinic was at goal of less than 140/80 She is checking her blood pressure at home first thing in the morning and is not resting She did demonstrate fairly good technique with putting cuff on Unclear whether she was taking both metoprolol and carvedilol and what strength of carvedilol she is taking  Plan: I have asked patient to call me when she gets home and looks at her medications to clarify what exactly she is taking Concerned she may be taking both beta-blockers as her heart rate was in the low 50s I have scheduled her follow-up in clinic in about 3 weeks Encourage patient to make sure that she rests at least 5 minutes prior to checking  Diabetes mellitus (HCC) Still concerned with patient's blood sugars.  Fasting blood sugar she brings me are less than 80.  She denies discussing with Dr. Loleta Chance or his office her blood sugars.  Reports taking 72 units of Tresiba.  I have asked  her to decrease this to 68 units. I spoke with the pharmacist at the [facility], about my concerns with patient and her insulin, blood sugars and memory. Per [name] she will discuss with Dr. Loleta Chance and ask Dr. Loleta Chance to reach out to patient. Patient was supposed to see Dr. Loleta Chance today at 10:45, but her apt with me was at 10:30.    Thank you  Olene Floss, Pharm.D, BCACP, BCPS, CPP [facility] Division of [facility] [address] [place], [place] [address]  Phone: [phone]; Fax: [phone]

## 2022-09-10 NOTE — Assessment & Plan Note (Addendum)
Still concerned with patient's blood sugars.  Fasting blood sugar she brings me are less than 80.  She denies discussing with Dr. Loleta Chance or his office her blood sugars.  Reports taking 72 units of Tresiba.  I have asked her to decrease this to 68 units. I spoke with the pharmacist at the [facility], about my concerns with patient and her insulin, blood sugars and memory. Per [name] she will discuss with Dr. Loleta Chance and ask Dr. Loleta Chance to reach out to patient. Patient was supposed to see Dr. Loleta Chance today at 10:45, but her apt with me was at 10:30.

## 2022-09-10 NOTE — Telephone Encounter (Signed)
Patient called and said she has carvedilol at home, but she "crossed it off and isnt taking it" she is taking metoprolol at night. Per [name] at PCP [facility] I advised patient she is not to take metoprolol. She is only to take carvedilol twice a day. I advised she will need to remove metoprolol from her medbox.

## 2022-09-16 DIAGNOSIS — E1142 Type 2 diabetes mellitus with diabetic polyneuropathy: Secondary | ICD-10-CM | POA: Diagnosis not present

## 2022-09-16 DIAGNOSIS — N184 Chronic kidney disease, stage 4 (severe): Secondary | ICD-10-CM | POA: Diagnosis not present

## 2022-09-16 DIAGNOSIS — I10 Essential (primary) hypertension: Secondary | ICD-10-CM | POA: Diagnosis not present

## 2022-09-23 DIAGNOSIS — Z1231 Encounter for screening mammogram for malignant neoplasm of breast: Secondary | ICD-10-CM | POA: Diagnosis not present

## 2022-09-30 ENCOUNTER — Ambulatory Visit: Payer: Medicare PPO | Attending: Cardiology | Admitting: Pharmacist

## 2022-09-30 VITALS — BP 142/68 | HR 61

## 2022-09-30 DIAGNOSIS — I10 Essential (primary) hypertension: Secondary | ICD-10-CM

## 2022-09-30 NOTE — Patient Instructions (Addendum)
STOP taking metoprolol  Please call me when you get home [phone]  You should be taking amlodipine 10mg  daily, carvedilol 12.5mg  twice a day, furosemide 10mg  daily Please decrease your Tresiba to 68 units

## 2022-09-30 NOTE — Assessment & Plan Note (Addendum)
Assessment: Blood pressure is close to goal (<140/80) in clinic today Home readings are above goal  Unclear what patient is taking HR was 60 (may be taking both metoprolol and carvedilol) Concerned about patient's memory and her use of insulin- I have contacted her PCP about this multiple times. Per patient she can't remember if they discussed at last appointment. I reminded her again she needs to decrease her Tresiba to 68 units (she says she is taking 72). Advised that her AM blood sugar should be >80.  Plan: I have asked patient to call me when she gets home so I know exactly what medications she is taking Plan will be determined off of what patient is taking She will see Dr. Mendel Corning next week and me in the beginning of Sept

## 2022-09-30 NOTE — Progress Notes (Unsigned)
Patient ID: [name]                 DOB: 1940-11-26                      MRN: [id number]      HPI: [name] is a 82 y.o. female referred by [name] to HTN clinic. PMH is significant for hypertension, type 2 DM, hyperlipidemia, prior normal coronary angiograms 2012 and 2016, h/o nephrectomy, CKD III-IV, GERD, mild dementia. At last visit with [name], amlodipine was increased to 10mg  daily.   At visit with Pharm.D. for 05/21/22 blood pressure was 145/62.  Very limited home readings available.  Patient was not exercising.  Her PCP signed her up for pill pack but when they arrived at her house she cut them open and put them in her pillbox herself.  We recommended changing metoprolol to carvedilol but patient had just picked up metoprolol and did not want to make this change.  She was encouraged to go to the [name] or walk using a cane.  I asked her to please bring in her home blood pressure cuff to next visit.  At visit 06/17/22, blood pressure as 154/62. BP at home averaged 133/65. She has just started walking with a friend. No medication changes were made. I saw her again 5/28, however patient was not feeling well that day (diarrhea, nausea). She also had had a URI a few weeks prior and had not been checking her blood pressure.   Patient last seen 6/25. Blood pressure was 140/60. Her metoprolol was stopped and carvedilol 12.5mg  twice a day was started. Her home cuff was also found to be inaccurate and was asked to purchase a new one. [facility]stream was updated on medication change. She had some low blood sugars and I advised that she speak with her PCP about this. I also called and left message at her PCP office.   Patient last seen 09/10/22. Confusion over metoprolol vs carvedilol. Patient called me when she got home and stated she was taking metoprolol and not carvedilol. Advised that she needs to stop metoprolol and start taking carvedilol 12.5mg  twice a day. I spoke with PCP pharmacist [name]  over concerns about her memory and blood sugar.   Patient presents today to clinic. She brings in a list of BP readings- all in the 150-160's systolic. She says she thinks she is taking both metoprolol and carvedilol. Still unsure of dose.Says her BP cuff isn't working. I checked it and home cuff got a reading of 146/70 which matched our clinic reading.  Blood sugars are still 70's, 80 and one reading of 50. She admits she didn't decrease her [name] because her sugar is high. I reminded her that AM blood sugars that low are dangerous and she needs to decrease.   Patient reports being tired and busy with her choirs. Reports being on the go. Upset with her daughter.   Current HTN meds: amlodipine 10mg  daily, carvedilol 12.5mg  twice a day, furosemide 10mg  daily Previously tried: lisinopril (CKD) BP goal: <140/80  Family History:  Family History  Problem Relation Age of Onset   Hypertension Mother    Coronary artery disease Other    Breast cancer Cousin     Social History: no tobacco, no alcohol  Diet: no salt on food Cooks some, mostly eats out ([name], [name]) Tried to always include a vegetable Lamb chops, seafood  Exercise: none  Home BP readings:  155/75, 162/80, 162/72, 158/81, 166/74, 158/74, 156/75   Wt Readings from Last 3 Encounters:  07/09/22 146 lb (66.2 kg)  05/03/22 143 lb (64.9 kg)  04/10/22 150 lb 6.4 oz (68.2 kg)   BP Readings from Last 3 Encounters:  09/30/22 (!) 142/68  09/10/22 138/60  08/13/22 (!) 140/60   Pulse Readings from Last 3 Encounters:  09/30/22 61  09/10/22 (!) 53  08/13/22 96    Renal function: CrCl cannot be calculated (Patient's most recent lab result is older than the maximum 21 days allowed.).  Past Medical History:  Diagnosis Date   ACS (acute coronary syndrome) (HCC) 12/27/2010   AKI (acute kidney injury) (HCC) 03/25/2018   Anginal pain (HCC)    Arthritis    Asthma    Diabetes mellitus    Type 1   Dysrhythmia     "irregular heart beat"   GERD (gastroesophageal reflux disease)    History of urinary tract infection    Hypercholesteremia    Hypertension    left renal ca dx'd 2012 (?)   surg only, left kidney   Nocturia    Reflux    Renal failure (ARF), acute on chronic (HCC)    Shortness of breath dyspnea    pt denies; states can climb stairs w/o difficulty    Sleep apnea    does not use c-pap machine   Tingling in extremities    legs bilat   Tinnitus    Urinary frequency    Urinary incontinence    Vertigo    occurs when lying flat     Current Outpatient Medications on File Prior to Visit  Medication Sig Dispense Refill   acetaminophen (TYLENOL 8 HOUR) 650 MG CR tablet Take 1 tablet (650 mg total) by mouth every 8 (eight) hours as needed for pain. (Patient taking differently: Take 650 mg by mouth in the morning and at bedtime.) 15 tablet 0   albuterol (PROVENTIL,VENTOLIN) 90 MCG/ACT inhaler Inhale 2 puffs into the lungs every 4 (four) hours as needed for wheezing or shortness of breath.      amLODipine (NORVASC) 10 MG tablet Take 1 tablet (10 mg total) by mouth daily. 90 tablet 3   aspirin EC 81 MG tablet Take 81 mg by mouth at bedtime.     atorvastatin (LIPITOR) 10 MG tablet Take 10 mg by mouth every morning. (Patient not taking: Reported on 06/17/2022)     carvedilol (COREG) 12.5 MG tablet Take 1 tablet (12.5 mg total) by mouth 2 (two) times daily. 180 tablet 3   cholecalciferol (VITAMIN D) 1000 units tablet Take 1,000 Units by mouth daily.     donepezil (ARICEPT) 10 MG tablet Take 1 tablet (10 mg total) by mouth at bedtime. Must be seen for further refills. Call [phone]. 90 tablet 0   furosemide (LASIX) 20 MG tablet Take 10 mg by mouth daily.     gabapentin (NEURONTIN) 300 MG capsule Take 1 capsule (300 mg total) by mouth at bedtime. 90 capsule 0   insulin degludec (TRESIBA) 200 UNIT/ML FlexTouch Pen Inject 80 Units into the skin daily before breakfast.     lidocaine (LIDODERM) 5 %  Place 1 patch onto the skin daily. Remove & Discard patch within 12 hours or as directed by MD (Patient not taking: Reported on 04/10/2022) 30 patch 0   mirabegron ER (MYRBETRIQ) 50 MG TB24 tablet Take 50 mg by mouth daily.     Multiple Vitamins-Minerals (CENTRUM SILVER ADULT 50+ PO) Take 1 tablet by  mouth daily.     pantoprazole (PROTONIX) 40 MG tablet Take 1 tablet (40 mg total) by mouth daily. 30 tablet 0   TRADJENTA 5 MG TABS tablet Take 5 mg by mouth daily.     No current facility-administered medications on file prior to visit.    Allergies  Allergen Reactions   Sulfa Antibiotics Itching    Blood pressure (!) 142/68, pulse 61.   Assessment/Plan:  1. Hypertension -  Hypertension Assessment: Blood pressure is close to goal (<140/80) in clinic today Home readings are above goal  Unclear what patient is taking HR was 60 (may be taking both metoprolol and carvedilol) Concerned about patient's memory and her use of insulin- I have contacted her PCP about this multiple times. Per patient she can't remember if they discussed at last appointment. I reminded her again she needs to decrease her Tresiba to 68 units (she says she is taking 72). Advised that her AM blood sugar should be >80.  Plan: I have asked patient to call me when she gets home so I know exactly what medications she is taking Plan will be determined off of what patient is taking She will see Dr. Mendel Corning next week and me in the beginning of Sept    Thank you  Olene Floss, Pharm.D, BCACP, BCPS, CPP [facility] [address], [place], [place] [address]  Phone: [phone]; Fax: [phone]

## 2022-10-01 ENCOUNTER — Telehealth: Payer: Self-pay | Admitting: Pharmacist

## 2022-10-01 NOTE — Telephone Encounter (Signed)
Called pt to follow up from apt yesterday. Patient was supposed to call me when she got home to tell me what medications she is taking. Tried to call pt. No answer and VM full

## 2022-10-04 DIAGNOSIS — H04223 Epiphora due to insufficient drainage, bilateral lacrimal glands: Secondary | ICD-10-CM | POA: Diagnosis not present

## 2022-10-04 DIAGNOSIS — H04123 Dry eye syndrome of bilateral lacrimal glands: Secondary | ICD-10-CM | POA: Diagnosis not present

## 2022-10-04 DIAGNOSIS — H35033 Hypertensive retinopathy, bilateral: Secondary | ICD-10-CM | POA: Diagnosis not present

## 2022-10-04 DIAGNOSIS — E113393 Type 2 diabetes mellitus with moderate nonproliferative diabetic retinopathy without macular edema, bilateral: Secondary | ICD-10-CM | POA: Diagnosis not present

## 2022-10-10 ENCOUNTER — Encounter: Payer: Self-pay | Admitting: Cardiology

## 2022-10-10 ENCOUNTER — Ambulatory Visit: Payer: Medicare PPO | Attending: Cardiology | Admitting: Cardiology

## 2022-10-10 VITALS — BP 162/60 | HR 58 | Ht 59.0 in | Wt 148.4 lb

## 2022-10-10 DIAGNOSIS — I2510 Atherosclerotic heart disease of native coronary artery without angina pectoris: Secondary | ICD-10-CM

## 2022-10-10 DIAGNOSIS — I10 Essential (primary) hypertension: Secondary | ICD-10-CM

## 2022-10-10 DIAGNOSIS — Z79899 Other long term (current) drug therapy: Secondary | ICD-10-CM

## 2022-10-10 DIAGNOSIS — E785 Hyperlipidemia, unspecified: Secondary | ICD-10-CM | POA: Diagnosis not present

## 2022-10-10 DIAGNOSIS — R079 Chest pain, unspecified: Secondary | ICD-10-CM

## 2022-10-10 MED ORDER — ISOSORB DINITRATE-HYDRALAZINE 20-37.5 MG PO TABS
1.0000 | ORAL_TABLET | Freq: Two times a day (BID) | ORAL | 3 refills | Status: DC
Start: 2022-10-10 — End: 2022-11-12

## 2022-10-10 NOTE — Progress Notes (Addendum)
Cardiology Note    Date:  10/10/2022   ID:  Amberli, Salt May 30, 1940, MRN [id number]  PCP:  Mirna Mires, MD  Cardiologist:  Armanda Magic, MD   Chief Complaint  Patient presents with   Follow-up    Chest pain, coronary artery calcifications, hypertension, hyperlipidemia    History of Present Illness:  [name] is a 82 y.o. female with a hx of hypertension, type 2 DM, hyperlipidemia, prior normal coronary angiograms 2012 and 2016, h/o nephrectomy, CKD III-IV, GERD, mild dementia. When she was seen last it was for a workup for chest pain.  This was felt to likely be related to hypertensive urgency documented in the ER.  Troponins were normal at that time.  She underwent a stress PET CT which showed no ischemia normal EF and normal myocardial blood flow reserve.  She did have minimal coronary calcifications present.  She is here today for followup and is doing well.  She still gets occasional CP that occurs after a choir rehearsal or at night.  She is fine when she does housework.  She did have some sharp pain this am in her chest and her arm.The arm pain is different from the chast pain and comes on when she lifts her arm up.  The CP is sharp and fleeting and identical to what she had that prompted her stress test.  She denies any SOB, DOE, PND, orthopnea, dizziness, palpitations or syncope. She does have intermittent LE edema. She is compliant with her meds and is tolerating meds with no SE.    Past Medical History:  Diagnosis Date   ACS (acute coronary syndrome) (HCC) 12/27/2010   AKI (acute kidney injury) (HCC) 03/25/2018   Anginal pain (HCC)    Arthritis    Asthma    Diabetes mellitus    Type 1   Dysrhythmia    "irregular heart beat"   GERD (gastroesophageal reflux disease)    History of urinary tract infection    Hypercholesteremia    Hypertension    left renal ca dx'd 2012 (?)   surg only, left kidney   Nocturia    Reflux    Renal failure (ARF), acute on  chronic (HCC)    Shortness of breath dyspnea    pt denies; states can climb stairs w/o difficulty    Sleep apnea    does not use c-pap machine   Tingling in extremities    legs bilat   Tinnitus    Urinary frequency    Urinary incontinence    Vertigo    occurs when lying flat     Past Surgical History:  Procedure Laterality Date   ABDOMINAL HYSTERECTOMY     APPENDECTOMY     CARDIAC CATHETERIZATION N/A 07/28/2014   Procedure: Left Heart Cath and Coronary Angiography;  Surgeon: Yates Decamp, MD;  Location[facility] INVASIVE CV LAB;  Service: Cardiovascular;  Laterality: N/A;   CYSTOSCOPY WITH RETROGRADE PYELOGRAM, URETEROSCOPY AND STENT PLACEMENT Right 02/09/2015   Procedure: CYSTOSCOPY WITH RETROGRADE PYELOGRAM AND URETEROSCOPY ;  Surgeon: Heloise Purpura, MD;  Location: [facility] ORS;  Service: Urology;  Laterality: Right;   LAPAROSCOPIC NEPHRECTOMY Right 03/20/2015   Procedure: LAPAROSCOPIC NEPHRECTOMY;  Surgeon: Heloise Purpura, MD;  Location: [facility] ORS;  Service: Urology;  Laterality: Right;   PERIPHERAL VASCULAR CATHETERIZATION N/A 07/28/2014   Procedure: Aortic Arch Angiography;  Surgeon: Yates Decamp, MD;  Location: [facility] INVASIVE CV LAB;  Service: Cardiovascular;  Laterality: N/A;   RENAL MASS EXCISION  SPINE SURGERY      Current Medications: Current Meds  Medication Sig   acetaminophen (TYLENOL 8 HOUR) 650 MG CR tablet Take 1 tablet (650 mg total) by mouth every 8 (eight) hours as needed for pain. (Patient taking differently: Take 650 mg by mouth in the morning and at bedtime.)   albuterol (PROVENTIL,VENTOLIN) 90 MCG/ACT inhaler Inhale 2 puffs into the lungs every 4 (four) hours as needed for wheezing or shortness of breath.    amLODipine (NORVASC) 10 MG tablet Take 1 tablet (10 mg total) by mouth daily.   aspirin EC 81 MG tablet Take 81 mg by mouth at bedtime.   carvedilol (COREG) 12.5 MG tablet Take 1 tablet (12.5 mg total) by mouth 2 (two) times daily.   cholecalciferol (VITAMIN D) 1000 units tablet  Take 1,000 Units by mouth daily.   donepezil (ARICEPT) 10 MG tablet Take 1 tablet (10 mg total) by mouth at bedtime. Must be seen for further refills. Call [phone].   furosemide (LASIX) 20 MG tablet Take 10 mg by mouth daily.   gabapentin (NEURONTIN) 300 MG capsule Take 1 capsule (300 mg total) by mouth at bedtime.   insulin degludec (TRESIBA) 200 UNIT/ML FlexTouch Pen Inject 80 Units into the skin daily before breakfast.   lidocaine (LIDODERM) 5 % Place 1 patch onto the skin daily. Remove & Discard patch within 12 hours or as directed by MD   mirabegron ER (MYRBETRIQ) 50 MG TB24 tablet Take 50 mg by mouth daily.   pantoprazole (PROTONIX) 40 MG tablet Take 1 tablet (40 mg total) by mouth daily.   TRADJENTA 5 MG TABS tablet Take 5 mg by mouth daily.    Allergies:   Sulfa antibiotics   Social History   Socioeconomic History   Marital status: Widowed    Spouse name: Not on file   Number of children: 2   Years of education: Not on file   Highest education level: Not on file  Occupational History   Not on file  Tobacco Use   Smoking status: Never   Smokeless tobacco: Never  Vaping Use   Vaping status: Never Used  Substance and Sexual Activity   Alcohol use: No   Drug use: No   Sexual activity: Never  Other Topics Concern   Not on file  Social History Narrative   Not on file   Social Determinants of Health   Financial Resource Strain: Not on file  Food Insecurity: No Food Insecurity (09/19/2021)   Hunger Vital Sign    Worried About Running Out of Food in the Last Year: Never true    Ran Out of Food in the Last Year: Never true  Transportation Needs: No Transportation Needs (09/19/2021)   PRAPARE - Administrator, Civil Service (Medical): No    Lack of Transportation (Non-Medical): No  Physical Activity: Not on file  Stress: Not on file  Social Connections: Not on file     Family History:  The patient's family history includes Breast cancer in her cousin;  Coronary artery disease in an other family member; Hypertension in her mother.   ROS:   Please see the history of present illness.    ROS All other systems reviewed and are negative.      No data to display             PHYSICAL EXAM:   VS:  BP (!) 162/60   Pulse (!) 58   Ht 4\' 11"  (1.499 m)  Wt 148 lb 6.4 oz (67.3 kg)   SpO2 99%   BMI 29.97 kg/m    GEN: Well nourished, well developed in no acute distress HEENT: Normal NECK: No JVD; No carotid bruits LYMPHATICS: No lymphadenopathy CARDIAC:RRR, no murmurs, rubs, gallops RESPIRATORY:  Clear to auscultation without rales, wheezing or rhonchi  ABDOMEN: Soft, non-tender, non-distended MUSCULOSKELETAL:  No edema; No deformity  SKIN: Warm and dry NEUROLOGIC:  Alert and oriented x 3 PSYCHIATRIC:  Normal affect  Wt Readings from Last 3 Encounters:  10/10/22 148 lb 6.4 oz (67.3 kg)  07/09/22 146 lb (66.2 kg)  05/03/22 143 lb (64.9 kg)      Studies/Labs Reviewed:   EKG Interpretation Date/Time:  Thursday October 10 2022 11:19:03 EDT Ventricular Rate:  55 PR Interval:  184 QRS Duration:  74 QT Interval:  428 QTC Calculation: 409 R Axis:   71  Text Interpretation: Sinus bradycardia When compared with ECG of 09-Jul-2022 20:04, PREVIOUS ECG IS PRESENT Confirmed by Armanda Magic [phone]) on 10/10/2022 11:25:00 AM    Recent Labs: 07/09/2022: ALT 15; BUN 23; Creatinine, Ser 1.86; Hemoglobin 12.1; Platelets 265; Potassium 4.3; Sodium 135   Lipid Panel    Component Value Date/Time   CHOL 137 12/28/2010 0010   TRIG 97 12/28/2010 0010   HDL 43 12/28/2010 0010   CHOLHDL 3.2 12/28/2010 0010   VLDL 19 12/28/2010 0010   LDLCALC 75 12/28/2010 0010   Additional studies/ records that were reviewed today include:  none    ASSESSMENT:    1. Primary hypertension   2. Chest pain, unspecified type   3. Coronary artery calcification seen on CAT scan   4. Hyperlipidemia LDL goal <70       PLAN:  In order of problems  listed above:  HTN -BP poorly controlled on exam today -Continue prescription drug management with amlodipine 10 mg daily, carvedilol 6.25 mg twice daily with as needed refills -cannot increase BB further due to bradycardia -no ACE/ARB/MRA due to CKD stage 3b -Add Bidil 20-37.5mg  BID -Followup with Pharm D in 2 weeks for BP check -I will get last OV notes from Ren[facility]  2.  Chest pain -she has had 2 normal caths in the past with last being 2016  -She had a ER visit for chest pain in early 2024 in the setting of hypertensive urgency with normal troponins.  Stress PET CT showed no ischemia, normal LV function and normal myocardial blood flow reserve.  There were minimal coronary calcifications -Her episodes of chest pain are likely related to demand ischemia in the setting of poorly controlled blood pressure and possible GERD -She still has CP at times >>?GERD since it occurs at night more often>>Continue Protonix -She had some mild sharp chest pain in her chest this morning and left arm but EKG is completely normal.   -her CP is exactly like what it was when she had her stress PET CT which showed no ischemia and normal blood flow reserve -I do not think her CP is cardiac -No further cardiac workup for ischemia at this time  3.  Coronary artery calcifications -Noted  on recent stress PET CT which showed no ischemia -Continue statin therapy  4.  Hyperlipidemia -LDL goal less than 70 because of coronary calcifications -continue prescription drug management with Atorvastatin 10mg  daily  -I have personally reviewed and interpreted outside labs performed by patient's PCP which showed LDL 74 and HDL 48 on 03/21/2022 -Repeat FLP and ALT  Follow-up with me in 1  year  Time Spent: 20 minutes total time of encounter, including 15 minutes spent in face-to-face patient care on the date of this encounter. This time includes coordination of care and counseling regarding above mentioned problem list.  Remainder of non-face-to-face time involved reviewing chart documents/testing relevant to the patient encounter and documentation in the medical record. I have independently reviewed documentation from referring provider  Medication Adjustments/Labs and Tests Ordered: Current medicines are reviewed at length with the patient today.  Concerns regarding medicines are outlined above.  Medication changes, Labs and Tests ordered today are listed in the Patient Instructions below.  There are no Patient Instructions on file for this visit.   Signed, Armanda Magic, MD  10/10/2022 10:52 AM    [facility] [address] [place], [place], [place]  [address] Phone: [phone]; Fax: [phone]

## 2022-10-10 NOTE — Addendum Note (Signed)
Addended by: Luellen Pucker on: 10/10/2022 11:11 AM   Modules accepted: Orders

## 2022-10-10 NOTE — Patient Instructions (Signed)
Medication Instructions:  Please START taking Bidil twice daily.   *If you need a refill on your cardiac medications before your next appointment, please call your pharmacy*   Lab Work: Please make an appointment to have a FASTING lipid panel and an ALT drawn in our lab.   If you have labs (blood work) drawn today and your tests are completely normal, you will receive your results only by: MyChart Message (if you have MyChart) OR A paper copy in the mail If you have any lab test that is abnormal or we need to change your treatment, we will call you to review the results.   Testing/Procedures: None.   Follow-Up: At [facility], you and your health needs are our priority.  As part of our continuing mission to provide you with exceptional heart care, we have created designated Provider Care Teams.  These Care Teams include your primary Cardiologist (physician) and Advanced Practice Providers (APPs -  Physician Assistants and Nurse Practitioners) who all work together to provide you with the care you need, when you need it.  We recommend signing up for the patient portal called "MyChart".  Sign up information is provided on this After Visit Summary.  MyChart is used to connect with patients for Virtual Visits (Telemedicine).  Patients are able to view lab/test results, encounter notes, upcoming appointments, etc.  Non-urgent messages can be sent to your provider as well.   To learn more about what you can do with MyChart, go to [email or web address].    Your next appointment:   1 year(s)  Provider:   None     Other Instructions You have been referred to our hypertension clinic. Someone will call to get your scheduled for a visit.

## 2022-10-10 NOTE — Addendum Note (Signed)
Addended by: Luellen Pucker on: 10/10/2022 11:19 AM   Modules accepted: Orders

## 2022-10-15 DIAGNOSIS — M159 Polyosteoarthritis, unspecified: Secondary | ICD-10-CM | POA: Diagnosis not present

## 2022-10-15 DIAGNOSIS — M25512 Pain in left shoulder: Secondary | ICD-10-CM | POA: Diagnosis not present

## 2022-10-15 DIAGNOSIS — M25511 Pain in right shoulder: Secondary | ICD-10-CM | POA: Diagnosis not present

## 2022-10-15 DIAGNOSIS — I10 Essential (primary) hypertension: Secondary | ICD-10-CM | POA: Diagnosis not present

## 2022-10-15 DIAGNOSIS — H6123 Impacted cerumen, bilateral: Secondary | ICD-10-CM | POA: Diagnosis not present

## 2022-10-15 DIAGNOSIS — E1142 Type 2 diabetes mellitus with diabetic polyneuropathy: Secondary | ICD-10-CM | POA: Diagnosis not present

## 2022-10-15 DIAGNOSIS — N184 Chronic kidney disease, stage 4 (severe): Secondary | ICD-10-CM | POA: Diagnosis not present

## 2022-10-15 DIAGNOSIS — K580 Irritable bowel syndrome with diarrhea: Secondary | ICD-10-CM | POA: Diagnosis not present

## 2022-10-18 ENCOUNTER — Ambulatory Visit: Payer: Medicare PPO | Attending: Cardiology

## 2022-10-18 DIAGNOSIS — E785 Hyperlipidemia, unspecified: Secondary | ICD-10-CM

## 2022-10-18 DIAGNOSIS — Z79899 Other long term (current) drug therapy: Secondary | ICD-10-CM

## 2022-10-19 LAB — LIPID PANEL
Chol/HDL Ratio: 3.8 ratio (ref 0.0–4.4)
Cholesterol, Total: 150 mg/dL (ref 100–199)
HDL: 39 mg/dL — ABNORMAL LOW (ref >39)
LDL Chol Calc (NIH): 67 mg/dL (ref 0–99)
Triglycerides: 274 mg/dL — ABNORMAL HIGH (ref 0–149)
VLDL Cholesterol Cal: 44 mg/dL — ABNORMAL HIGH (ref 5–40)

## 2022-10-19 LAB — ALT: ALT: 14 IU/L (ref 0–32)

## 2022-10-21 NOTE — Progress Notes (Signed)
Please find out if she was fasting for this

## 2022-10-25 ENCOUNTER — Emergency Department (HOSPITAL_COMMUNITY): Payer: No Typology Code available for payment source

## 2022-10-25 ENCOUNTER — Emergency Department (HOSPITAL_COMMUNITY)
Admission: EM | Admit: 2022-10-25 | Discharge: 2022-10-25 | Disposition: A | Discharge status: Home / Self Care | Payer: No Typology Code available for payment source | Attending: Emergency Medicine | Admitting: Emergency Medicine

## 2022-10-25 ENCOUNTER — Telehealth: Payer: Self-pay

## 2022-10-25 ENCOUNTER — Encounter (HOSPITAL_COMMUNITY): Payer: Self-pay

## 2022-10-25 ENCOUNTER — Other Ambulatory Visit: Payer: Self-pay

## 2022-10-25 DIAGNOSIS — R109 Unspecified abdominal pain: Secondary | ICD-10-CM | POA: Insufficient documentation

## 2022-10-25 DIAGNOSIS — M79605 Pain in left leg: Secondary | ICD-10-CM | POA: Insufficient documentation

## 2022-10-25 DIAGNOSIS — S3991XA Unspecified injury of abdomen, initial encounter: Secondary | ICD-10-CM | POA: Diagnosis not present

## 2022-10-25 DIAGNOSIS — S3993XA Unspecified injury of pelvis, initial encounter: Secondary | ICD-10-CM | POA: Diagnosis not present

## 2022-10-25 DIAGNOSIS — Z981 Arthrodesis status: Secondary | ICD-10-CM | POA: Diagnosis not present

## 2022-10-25 DIAGNOSIS — R079 Chest pain, unspecified: Secondary | ICD-10-CM | POA: Diagnosis not present

## 2022-10-25 DIAGNOSIS — Z7982 Long term (current) use of aspirin: Secondary | ICD-10-CM | POA: Diagnosis not present

## 2022-10-25 DIAGNOSIS — M5030 Other cervical disc degeneration, unspecified cervical region: Secondary | ICD-10-CM | POA: Diagnosis not present

## 2022-10-25 DIAGNOSIS — I6782 Cerebral ischemia: Secondary | ICD-10-CM | POA: Diagnosis not present

## 2022-10-25 DIAGNOSIS — M79604 Pain in right leg: Secondary | ICD-10-CM | POA: Diagnosis not present

## 2022-10-25 DIAGNOSIS — R519 Headache, unspecified: Secondary | ICD-10-CM | POA: Insufficient documentation

## 2022-10-25 DIAGNOSIS — Z794 Long term (current) use of insulin: Secondary | ICD-10-CM | POA: Insufficient documentation

## 2022-10-25 DIAGNOSIS — R1032 Left lower quadrant pain: Secondary | ICD-10-CM | POA: Diagnosis not present

## 2022-10-25 DIAGNOSIS — Y92410 Unspecified street and highway as the place of occurrence of the external cause: Secondary | ICD-10-CM | POA: Insufficient documentation

## 2022-10-25 DIAGNOSIS — I2510 Atherosclerotic heart disease of native coronary artery without angina pectoris: Secondary | ICD-10-CM | POA: Insufficient documentation

## 2022-10-25 DIAGNOSIS — S199XXA Unspecified injury of neck, initial encounter: Secondary | ICD-10-CM | POA: Diagnosis not present

## 2022-10-25 DIAGNOSIS — S0990XA Unspecified injury of head, initial encounter: Secondary | ICD-10-CM | POA: Diagnosis not present

## 2022-10-25 DIAGNOSIS — I10 Essential (primary) hypertension: Secondary | ICD-10-CM | POA: Insufficient documentation

## 2022-10-25 DIAGNOSIS — G9389 Other specified disorders of brain: Secondary | ICD-10-CM | POA: Diagnosis not present

## 2022-10-25 DIAGNOSIS — R739 Hyperglycemia, unspecified: Secondary | ICD-10-CM | POA: Diagnosis not present

## 2022-10-25 DIAGNOSIS — M47812 Spondylosis without myelopathy or radiculopathy, cervical region: Secondary | ICD-10-CM | POA: Diagnosis not present

## 2022-10-25 DIAGNOSIS — R0789 Other chest pain: Secondary | ICD-10-CM | POA: Diagnosis not present

## 2022-10-25 DIAGNOSIS — R0902 Hypoxemia: Secondary | ICD-10-CM | POA: Diagnosis not present

## 2022-10-25 DIAGNOSIS — E119 Type 2 diabetes mellitus without complications: Secondary | ICD-10-CM | POA: Diagnosis not present

## 2022-10-25 DIAGNOSIS — Z79899 Other long term (current) drug therapy: Secondary | ICD-10-CM | POA: Diagnosis not present

## 2022-10-25 DIAGNOSIS — M79661 Pain in right lower leg: Secondary | ICD-10-CM | POA: Diagnosis not present

## 2022-10-25 DIAGNOSIS — S299XXA Unspecified injury of thorax, initial encounter: Secondary | ICD-10-CM | POA: Diagnosis not present

## 2022-10-25 DIAGNOSIS — M79662 Pain in left lower leg: Secondary | ICD-10-CM | POA: Diagnosis not present

## 2022-10-25 DIAGNOSIS — R1031 Right lower quadrant pain: Secondary | ICD-10-CM | POA: Diagnosis not present

## 2022-10-25 DIAGNOSIS — I700 Atherosclerosis of aorta: Secondary | ICD-10-CM | POA: Diagnosis not present

## 2022-10-25 LAB — CBC
HCT: 41.6 % (ref 36.0–46.0)
Hemoglobin: 13.5 g/dL (ref 12.0–15.0)
MCH: 30.2 pg (ref 26.0–34.0)
MCHC: 32.5 g/dL (ref 30.0–36.0)
MCV: 93.1 fL (ref 80.0–100.0)
Platelets: 258 10*3/uL (ref 150–400)
RBC: 4.47 MIL/uL (ref 3.87–5.11)
RDW: 13.6 % (ref 11.5–15.5)
WBC: 5.5 10*3/uL (ref 4.0–10.5)
nRBC: 0 % (ref 0.0–0.2)

## 2022-10-25 LAB — I-STAT CHEM 8, ED
BUN: 32 mg/dL — ABNORMAL HIGH (ref 8–23)
Calcium, Ion: 1.18 mmol/L (ref 1.15–1.40)
Chloride: 106 mmol/L (ref 98–111)
Creatinine, Ser: 2.2 mg/dL — ABNORMAL HIGH (ref 0.44–1.00)
Glucose, Bld: 232 mg/dL — ABNORMAL HIGH (ref 70–99)
HCT: 43 % (ref 36.0–46.0)
Hemoglobin: 14.6 g/dL (ref 12.0–15.0)
Potassium: 4.6 mmol/L (ref 3.5–5.1)
Sodium: 140 mmol/L (ref 135–145)
TCO2: 24 mmol/L (ref 22–32)

## 2022-10-25 MED ORDER — CYCLOBENZAPRINE HCL 10 MG PO TABS: 5.0000 mg | ORAL_TABLET | Freq: Two times a day (BID) | ORAL | 0 refills | Status: DC | PRN

## 2022-10-25 MED ORDER — ACETAMINOPHEN 500 MG PO TABS
1000.0000 mg | ORAL_TABLET | ORAL | Status: AC
  Administered 2022-10-25: 1000 mg via ORAL
  Filled 2022-10-25: qty 2

## 2022-10-25 NOTE — Telephone Encounter (Signed)
-----   Message from Armanda Magic sent at 10/21/2022  9:50 AM EDT ----- Please find out if she was fasting for this

## 2022-10-25 NOTE — Telephone Encounter (Signed)
Called, no answer, unable to leave message as VM was full.
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
## 2022-10-25 NOTE — ED Notes (Signed)
The pt is alert and oriented skin warm and dry  she just returned from xray  visitor at  the bedside

## 2022-10-29 ENCOUNTER — Ambulatory Visit: Payer: Medicare PPO

## 2022-10-29 DIAGNOSIS — M542 Cervicalgia: Secondary | ICD-10-CM | POA: Diagnosis not present

## 2022-10-29 DIAGNOSIS — R0789 Other chest pain: Secondary | ICD-10-CM | POA: Diagnosis not present

## 2022-10-29 DIAGNOSIS — R1032 Left lower quadrant pain: Secondary | ICD-10-CM | POA: Diagnosis not present

## 2022-10-29 DIAGNOSIS — M79652 Pain in left thigh: Secondary | ICD-10-CM | POA: Diagnosis not present

## 2022-10-29 NOTE — Progress Notes (Deleted)
Patient ID: [name]                 DOB: 04/17/1940                      MRN: [id number]      HPI: [name] is a 82 y.o. female referred by [name] to HTN clinic. PMH is significant for hypertension, type 2 DM, hyperlipidemia, prior normal coronary angiograms 2012 and 2016, h/o nephrectomy, CKD III-IV, GERD, mild dementia. At last visit with [name], amlodipine was increased to 10mg  daily.   At visit with Pharm.D. for 05/21/22 blood pressure was 145/62.  Very limited home readings available.  Patient was not exercising.  Her PCP signed her up for pill pack but when they arrived at her house she cut them open and put them in her pillbox herself.  We recommended changing metoprolol to carvedilol but patient had just picked up metoprolol and did not want to make this change.  She was encouraged to go to the [facility] or walk using a cane.  I asked her to please bring in her home blood pressure cuff to next visit.  At visit 06/17/22, blood pressure as 154/62. BP at home averaged 133/65. She has just started walking with a friend. No medication changes were made. I saw her again 5/28, however patient was not feeling well that day (diarrhea, nausea). She also had had a URI a few weeks prior and had not been checking her blood pressure.   Patient last seen 6/25. Blood pressure was 140/60. Her metoprolol was stopped and carvedilol 12.5mg  twice a day was started. Her home cuff was also found to be inaccurate and was asked to purchase a new one. [facility]stream was updated on medication change. She had some low blood sugars and I advised that she speak with her PCP about this. I also called and left message at her PCP office.   Patient last seen 09/10/22. Confusion over metoprolol vs carvedilol. Patient called me when she got home and stated she was taking metoprolol and not carvedilol. Advised that she needs to stop metoprolol and start taking carvedilol 12.5mg  twice a day. I spoke with PCP pharmacist [name]  over concerns about her memory and blood sugar.   Patient presents today to clinic. She brings in a list of BP readings- all in the 150-160's systolic. She says she thinks she is taking both metoprolol and carvedilol. Still unsure of dose.Says her BP cuff isn't working. I checked it and home cuff got a reading of 146/70 which matched our clinic reading.  Blood sugars are still 70's, 80 and one reading of 50. She admits she didn't decrease her [name] because her sugar is high. I reminded her that AM blood sugars that low are dangerous and she needs to decrease.   Patient reports being tired and busy with her choirs. Reports being on the go. Upset with her daughter.   Bidil? Metoprolol or carvedilol?  Current HTN meds: amlodipine 10mg  daily, carvedilol 12.5mg  twice a day, furosemide 10mg  daily Previously tried: lisinopril (CKD) BP goal: <140/80  Family History:  Family History  Problem Relation Age of Onset   Hypertension Mother    Coronary artery disease Other    Breast cancer Cousin     Social History: no tobacco, no alcohol  Diet: no salt on food Cooks some, mostly eats out ([name], [name]) Tried to always include a vegetable Lamb chops, seafood  Exercise:  none  Home BP readings: 155/75, 162/80, 162/72, 158/81, 166/74, 158/74, 156/75   Wt Readings from Last 3 Encounters:  10/10/22 148 lb 6.4 oz (67.3 kg)  07/09/22 146 lb (66.2 kg)  05/03/22 143 lb (64.9 kg)   BP Readings from Last 3 Encounters:  10/25/22 (!) 158/81  10/10/22 (!) 162/60  09/30/22 (!) 142/68   Pulse Readings from Last 3 Encounters:  10/25/22 76  10/10/22 (!) 58  09/30/22 61    Renal function: CrCl cannot be calculated (Unknown ideal weight.).  Past Medical History:  Diagnosis Date   ACS (acute coronary syndrome) (HCC) 12/27/2010   AKI (acute kidney injury) (HCC) 03/25/2018   Anginal pain (HCC)    Arthritis    Asthma    Diabetes mellitus    Type 1   Dysrhythmia    "irregular  heart beat"   GERD (gastroesophageal reflux disease)    History of urinary tract infection    Hypercholesteremia    Hypertension    left renal ca dx'd 2012 (?)   surg only, left kidney   Nocturia    Reflux    Renal failure (ARF), acute on chronic (HCC)    Shortness of breath dyspnea    pt denies; states can climb stairs w/o difficulty    Sleep apnea    does not use c-pap machine   Tingling in extremities    legs bilat   Tinnitus    Urinary frequency    Urinary incontinence    Vertigo    occurs when lying flat     Current Outpatient Medications on File Prior to Visit  Medication Sig Dispense Refill   acetaminophen (TYLENOL 8 HOUR) 650 MG CR tablet Take 1 tablet (650 mg total) by mouth every 8 (eight) hours as needed for pain. (Patient taking differently: Take 650 mg by mouth in the morning and at bedtime.) 15 tablet 0   albuterol (PROVENTIL,VENTOLIN) 90 MCG/ACT inhaler Inhale 2 puffs into the lungs every 4 (four) hours as needed for wheezing or shortness of breath.      amLODipine (NORVASC) 10 MG tablet Take 1 tablet (10 mg total) by mouth daily. 90 tablet 3   aspirin EC 81 MG tablet Take 81 mg by mouth at bedtime.     atorvastatin (LIPITOR) 10 MG tablet Take 10 mg by mouth every morning. (Patient not taking: Reported on 10/10/2022)     carvedilol (COREG) 12.5 MG tablet Take 1 tablet (12.5 mg total) by mouth 2 (two) times daily. 180 tablet 3   cholecalciferol (VITAMIN D) 1000 units tablet Take 1,000 Units by mouth daily.     cyclobenzaprine (FLEXERIL) 10 MG tablet Take 0.5 tablets (5 mg total) by mouth 2 (two) times daily as needed for muscle spasms. 10 tablet 0   donepezil (ARICEPT) 10 MG tablet Take 1 tablet (10 mg total) by mouth at bedtime. Must be seen for further refills. Call [phone]. 90 tablet 0   furosemide (LASIX) 20 MG tablet Take 10 mg by mouth daily.     gabapentin (NEURONTIN) 300 MG capsule Take 1 capsule (300 mg total) by mouth at bedtime. 90 capsule 0   insulin  degludec (TRESIBA) 200 UNIT/ML FlexTouch Pen Inject 80 Units into the skin daily before breakfast.     isosorbide-hydrALAZINE (BIDIL) 20-37.5 MG tablet Take 1 tablet by mouth in the morning and at bedtime. 180 tablet 3   lidocaine (LIDODERM) 5 % Place 1 patch onto the skin daily. Remove & Discard patch within 12 hours  or as directed by MD 30 patch 0   mirabegron ER (MYRBETRIQ) 50 MG TB24 tablet Take 50 mg by mouth daily.     Multiple Vitamins-Minerals (CENTRUM SILVER ADULT 50+ PO) Take 1 tablet by mouth daily. (Patient not taking: Reported on 10/10/2022)     pantoprazole (PROTONIX) 40 MG tablet Take 1 tablet (40 mg total) by mouth daily. 30 tablet 0   TRADJENTA 5 MG TABS tablet Take 5 mg by mouth daily.     No current facility-administered medications on file prior to visit.    Allergies  Allergen Reactions   Sulfa Antibiotics Itching    There were no vitals taken for this visit.   Assessment/Plan:  1. Hypertension -  No problem-specific Assessment & Plan notes found for this encounter.     Thank you  Olene Floss, Pharm.D, BCACP, BCPS, CPP [facility] [address], [place], [place] [address]  Phone: [phone]; Fax: [phone]

## 2022-11-04 ENCOUNTER — Telehealth: Payer: Self-pay

## 2022-11-04 NOTE — Telephone Encounter (Signed)
-----  Message from Armanda Magic sent at 10/21/2022  9:50 AM EDT ----- Please find out if she was fasting for this

## 2022-11-04 NOTE — Telephone Encounter (Signed)
Called to discuss lab results, no answer, unable to leave message as VM was full.

## 2022-11-05 ENCOUNTER — Ambulatory Visit: Payer: Medicare PPO | Attending: Family Medicine | Admitting: Physical Therapy

## 2022-11-05 DIAGNOSIS — R2681 Unsteadiness on feet: Secondary | ICD-10-CM

## 2022-11-05 DIAGNOSIS — R42 Dizziness and giddiness: Secondary | ICD-10-CM

## 2022-11-05 DIAGNOSIS — M79605 Pain in left leg: Secondary | ICD-10-CM | POA: Diagnosis not present

## 2022-11-05 DIAGNOSIS — R2689 Other abnormalities of gait and mobility: Secondary | ICD-10-CM

## 2022-11-05 DIAGNOSIS — M79602 Pain in left arm: Secondary | ICD-10-CM

## 2022-11-05 DIAGNOSIS — M6281 Muscle weakness (generalized): Secondary | ICD-10-CM | POA: Diagnosis not present
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
## 2022-11-06 ENCOUNTER — Ambulatory Visit: Payer: Medicare PPO

## 2022-11-07 ENCOUNTER — Ambulatory Visit: Payer: Medicare PPO | Attending: Cardiovascular Disease | Admitting: Pharmacist

## 2022-11-07 ENCOUNTER — Encounter: Payer: Self-pay | Admitting: Physical Therapy

## 2022-11-07 ENCOUNTER — Ambulatory Visit: Payer: Medicare PPO | Admitting: Physical Therapy

## 2022-11-07 VITALS — BP 140/60 | HR 59

## 2022-11-07 VITALS — BP 138/71 | HR 56

## 2022-11-07 DIAGNOSIS — M6281 Muscle weakness (generalized): Secondary | ICD-10-CM | POA: Diagnosis not present

## 2022-11-07 DIAGNOSIS — R2681 Unsteadiness on feet: Secondary | ICD-10-CM

## 2022-11-07 DIAGNOSIS — R2689 Other abnormalities of gait and mobility: Secondary | ICD-10-CM | POA: Diagnosis not present

## 2022-11-07 DIAGNOSIS — M79605 Pain in left leg: Secondary | ICD-10-CM | POA: Diagnosis not present

## 2022-11-07 DIAGNOSIS — M79602 Pain in left arm: Secondary | ICD-10-CM | POA: Diagnosis not present

## 2022-11-07 DIAGNOSIS — I10 Essential (primary) hypertension: Secondary | ICD-10-CM | POA: Diagnosis not present

## 2022-11-07 DIAGNOSIS — R42 Dizziness and giddiness: Secondary | ICD-10-CM | POA: Diagnosis not present

## 2022-11-07 NOTE — Patient Instructions (Signed)
Bring all of your medication bottles AND medication box with you to your next appointment. Please also bring your blood pressure cuff
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
## 2022-11-11 ENCOUNTER — Encounter: Payer: Self-pay | Admitting: Physical Therapy

## 2022-11-11 ENCOUNTER — Ambulatory Visit: Payer: Medicare PPO | Admitting: Physical Therapy

## 2022-11-11 VITALS — BP 161/60 | HR 59

## 2022-11-11 DIAGNOSIS — M79605 Pain in left leg: Secondary | ICD-10-CM

## 2022-11-11 DIAGNOSIS — R42 Dizziness and giddiness: Secondary | ICD-10-CM

## 2022-11-11 DIAGNOSIS — M79602 Pain in left arm: Secondary | ICD-10-CM | POA: Diagnosis not present

## 2022-11-11 DIAGNOSIS — M6281 Muscle weakness (generalized): Secondary | ICD-10-CM | POA: Diagnosis not present

## 2022-11-11 DIAGNOSIS — R2689 Other abnormalities of gait and mobility: Secondary | ICD-10-CM

## 2022-11-11 DIAGNOSIS — R2681 Unsteadiness on feet: Secondary | ICD-10-CM

## 2022-11-11 NOTE — Patient Instructions (Signed)
Access Code: [email or web address] URL: [email or web address] Date: 11/11/2022 Prepared by: Camille Bal  Exercises - Supine Bridge  - 1 x daily - 4 x weekly - 2 sets - 10 reps - Supine Straight Leg Raises  - 1 x daily - 4 x weekly - 2 sets - 10 reps
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
## 2022-11-12 ENCOUNTER — Ambulatory Visit: Payer: Medicare PPO | Attending: Cardiology | Admitting: Pharmacist

## 2022-11-12 ENCOUNTER — Other Ambulatory Visit (HOSPITAL_COMMUNITY): Payer: Self-pay

## 2022-11-12 VITALS — BP 147/68 | HR 60

## 2022-11-12 DIAGNOSIS — E11649 Type 2 diabetes mellitus with hypoglycemia without coma: Secondary | ICD-10-CM | POA: Diagnosis not present

## 2022-11-12 DIAGNOSIS — I10 Essential (primary) hypertension: Secondary | ICD-10-CM

## 2022-11-12 DIAGNOSIS — Z794 Long term (current) use of insulin: Secondary | ICD-10-CM | POA: Diagnosis not present

## 2022-11-12 MED ORDER — FARXIGA 10 MG PO TABS
10.0000 mg | ORAL_TABLET | Freq: Every day | ORAL | 11 refills | Status: DC
  Filled 2022-11-12 – 2022-11-19 (×2): qty 30, 30d supply, fill #0
  Filled 2022-12-16 (×2): qty 30, 30d supply, fill #1
  Filled 2023-01-09: qty 30, 30d supply, fill #2
  Filled 2023-02-06: qty 30, 30d supply, fill #3
  Filled 2023-02-08: qty 3, 3d supply, fill #4
  Filled 2023-02-21: qty 3, 3d supply, fill #5
  Filled 2023-03-07: qty 30, 30d supply, fill #5
  Filled 2023-03-07: qty 3, 3d supply, fill #5
  Filled 2023-03-28 – 2023-04-01 (×2): qty 30, 30d supply, fill #6
  Filled 2023-04-21 – 2023-04-24 (×2): qty 30, 30d supply, fill #7
  Filled 2023-05-02 – 2023-05-21 (×2): qty 30, 30d supply, fill #8

## 2022-11-12 MED ORDER — HYDRALAZINE HCL 25 MG PO TABS
25.0000 mg | ORAL_TABLET | Freq: Three times a day (TID) | ORAL | 3 refills | Status: DC
  Filled 2022-11-12 – 2022-11-19 (×2): qty 90, 30d supply, fill #0
  Filled 2022-12-16 (×2): qty 90, 30d supply, fill #1
  Filled 2023-01-09: qty 90, 30d supply, fill #2

## 2022-11-12 MED ORDER — CARVEDILOL 6.25 MG PO TABS
6.2500 mg | ORAL_TABLET | Freq: Two times a day (BID) | ORAL | 3 refills | Status: DC
  Filled 2022-11-12 – 2022-12-16 (×6): qty 60, 30d supply, fill #0
  Filled 2023-01-09: qty 60, 30d supply, fill #1
  Filled 2023-02-06: qty 60, 30d supply, fill #2
  Filled 2023-02-08: qty 6, 3d supply, fill #3
  Filled 2023-02-21 – 2023-03-07 (×2): qty 6, 3d supply, fill #4
  Filled 2023-03-07: qty 60, 30d supply, fill #4
  Filled 2023-03-28 – 2023-04-01 (×2): qty 60, 30d supply, fill #5
  Filled 2023-04-21 – 2023-04-24 (×2): qty 60, 30d supply, fill #6
  Filled 2023-05-02 – 2023-05-21 (×2): qty 60, 30d supply, fill #7
  Filled 2023-06-09 – 2023-06-17 (×2): qty 60, 30d supply, fill #8
  Filled 2023-07-04 – 2023-07-23 (×5): qty 60, 30d supply, fill #9
  Filled 2023-08-14 – 2023-08-15 (×2): qty 60, 30d supply, fill #10
  Filled 2023-09-16 – 2023-09-26 (×3): qty 60, 30d supply, fill #11

## 2022-11-12 MED ORDER — AMLODIPINE BESYLATE 10 MG PO TABS
10.0000 mg | ORAL_TABLET | Freq: Every day | ORAL | 3 refills | Status: DC
Start: 2022-11-12 — End: 2022-11-12

## 2022-11-12 MED ORDER — FUROSEMIDE 20 MG PO TABS
10.0000 mg | ORAL_TABLET | Freq: Every day | ORAL | 3 refills | Status: DC
  Filled 2022-11-12 – 2022-11-19 (×3): qty 15, 30d supply, fill #0
  Filled 2023-01-09: qty 15, 30d supply, fill #1
  Filled 2023-02-06 (×2): qty 15, 30d supply, fill #2
  Filled 2023-02-06: qty 45, 90d supply, fill #2

## 2022-11-12 MED ORDER — AMLODIPINE BESYLATE 10 MG PO TABS
10.0000 mg | ORAL_TABLET | Freq: Every day | ORAL | 3 refills | Status: DC
  Filled 2022-11-12 – 2022-11-19 (×2): qty 30, 30d supply, fill #0
  Filled 2022-12-16 (×3): qty 30, 30d supply, fill #1
  Filled 2023-01-09: qty 30, 30d supply, fill #2
  Filled 2023-02-06: qty 30, 30d supply, fill #3
  Filled 2023-02-08: qty 3, 3d supply, fill #4
  Filled 2023-02-21 – 2023-03-07 (×2): qty 3, 3d supply, fill #5
  Filled 2023-03-07: qty 30, 30d supply, fill #5
  Filled 2023-03-28 – 2023-04-01 (×2): qty 30, 30d supply, fill #6
  Filled 2023-04-21 – 2023-04-24 (×2): qty 30, 30d supply, fill #7
  Filled 2023-05-02 – 2023-05-21 (×2): qty 30, 30d supply, fill #8
  Filled 2023-06-09 – 2023-06-17 (×2): qty 30, 30d supply, fill #9
  Filled 2023-07-04 – 2023-07-23 (×5): qty 30, 30d supply, fill #10
  Filled 2023-08-14 – 2023-08-15 (×2): qty 30, 30d supply, fill #11
  Filled 2023-09-16: qty 30, 30d supply, fill #12

## 2022-11-12 MED ORDER — ATORVASTATIN CALCIUM 10 MG PO TABS
10.0000 mg | ORAL_TABLET | Freq: Every morning | ORAL | 3 refills | Status: DC
  Filled 2022-11-12 – 2022-11-14 (×2): qty 30, 30d supply, fill #0
  Filled 2022-11-14: qty 90, 90d supply, fill #0
  Filled 2022-11-19: qty 30, 30d supply, fill #0
  Filled 2022-12-16 (×2): qty 30, 30d supply, fill #1
  Filled 2023-01-09: qty 30, 30d supply, fill #2
  Filled 2023-01-20 – 2023-02-06 (×2): qty 30, 30d supply, fill #3
  Filled 2023-02-08: qty 3, 3d supply, fill #4
  Filled 2023-02-21 – 2023-03-07 (×2): qty 3, 3d supply, fill #5
  Filled 2023-03-07: qty 30, 30d supply, fill #5
  Filled 2023-03-28 – 2023-04-01 (×2): qty 30, 30d supply, fill #6
  Filled 2023-04-21 – 2023-04-24 (×2): qty 30, 30d supply, fill #7
  Filled 2023-05-02 – 2023-05-21 (×2): qty 30, 30d supply, fill #8
  Filled 2023-06-09 – 2023-06-17 (×2): qty 30, 30d supply, fill #9
  Filled 2023-07-04 – 2023-07-23 (×5): qty 30, 30d supply, fill #10
  Filled 2023-08-14 – 2023-08-15 (×2): qty 30, 30d supply, fill #11
  Filled 2023-09-16: qty 30, 30d supply, fill #12

## 2022-11-12 NOTE — Patient Instructions (Addendum)
START hydralazine 25mg  three times a day. This will be in your pill pack Continue amlodipine 10mg  daily and carvedilol 6.25mg  twice a day Someone from [facility]) pharmacy will call you about pill packs and delivery Please continue to check blood pressure at home

## 2022-11-12 NOTE — Assessment & Plan Note (Signed)
Patient shares that she is still having low BG at night. Happens maybe twice a week. BG in 50's, she has the shakes. She is drinking OJ before bed to avoid lows. States she decreased her [place] to 71 as I had told her to. I advised she needs to decrease even further. I have asked her to decrease to 64 units. Advised that low blood sugar can be very dangerous and we should not be drinking OJ to prevent lows, we need to be taking less insulin.
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
## 2022-11-12 NOTE — Assessment & Plan Note (Signed)
Assessment: Blood pressure above goal of <140/90 Concerned about patient's compliance and ability to make pill pack Discussed with patient using a pill pack service from [facility]. She was in agreement Reviewed how to use blood pressure machine Discussed that she really needs another med for her BP. Cannot increase carvedilol due to HR No need for Bidil as it will be expensive  Plan: Start hydralazine 25mg  TID. (She will atleast take it BID) Continue amlodipine 10mg  daily and carvedilol 6.25mg  BID I have called in rx to [facility] for all of the prescriptions my CPP allows. Pharmacy will transfer all the others I have contacted Sherlynn Stalls and she has set patients profile for pill pack and delivery They will call her tomorrow to verify address and payment.  Follow up in 3 weeks

## 2022-11-13 ENCOUNTER — Ambulatory Visit: Payer: Medicare PPO | Admitting: Physical Therapy

## 2022-11-13 ENCOUNTER — Other Ambulatory Visit (HOSPITAL_COMMUNITY): Payer: Self-pay

## 2022-11-13 ENCOUNTER — Other Ambulatory Visit: Payer: Self-pay

## 2022-11-13 VITALS — BP 147/58 | HR 52

## 2022-11-13 DIAGNOSIS — M79605 Pain in left leg: Secondary | ICD-10-CM | POA: Diagnosis not present

## 2022-11-13 DIAGNOSIS — M6281 Muscle weakness (generalized): Secondary | ICD-10-CM

## 2022-11-13 DIAGNOSIS — R42 Dizziness and giddiness: Secondary | ICD-10-CM | POA: Diagnosis not present

## 2022-11-13 DIAGNOSIS — R2681 Unsteadiness on feet: Secondary | ICD-10-CM | POA: Diagnosis not present

## 2022-11-13 DIAGNOSIS — M79602 Pain in left arm: Secondary | ICD-10-CM | POA: Diagnosis not present

## 2022-11-13 DIAGNOSIS — R2689 Other abnormalities of gait and mobility: Secondary | ICD-10-CM | POA: Diagnosis not present
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
## 2022-11-14 ENCOUNTER — Other Ambulatory Visit: Payer: Self-pay

## 2022-11-15 ENCOUNTER — Other Ambulatory Visit: Payer: Self-pay

## 2022-11-18 ENCOUNTER — Other Ambulatory Visit (HOSPITAL_COMMUNITY): Payer: Self-pay

## 2022-11-18 ENCOUNTER — Encounter: Payer: Self-pay | Admitting: Physical Therapy

## 2022-11-18 ENCOUNTER — Ambulatory Visit: Payer: Medicare PPO | Admitting: Physical Therapy

## 2022-11-18 DIAGNOSIS — M79605 Pain in left leg: Secondary | ICD-10-CM

## 2022-11-18 DIAGNOSIS — M79602 Pain in left arm: Secondary | ICD-10-CM

## 2022-11-18 DIAGNOSIS — R2689 Other abnormalities of gait and mobility: Secondary | ICD-10-CM

## 2022-11-18 DIAGNOSIS — M6281 Muscle weakness (generalized): Secondary | ICD-10-CM

## 2022-11-18 DIAGNOSIS — R2681 Unsteadiness on feet: Secondary | ICD-10-CM

## 2022-11-18 DIAGNOSIS — R42 Dizziness and giddiness: Secondary | ICD-10-CM

## 2022-11-18 DIAGNOSIS — R1012 Left upper quadrant pain: Secondary | ICD-10-CM | POA: Diagnosis not present
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
## 2022-11-18 NOTE — Patient Instructions (Signed)
Access Code: [email or web address] URL: [email or web address] Date: 11/18/2022 Prepared by: Camille Bal  Exercises - Supine Bridge  - 1 x daily - 4 x weekly - 2 sets - 10 reps - Supine Straight Leg Raises  - 1 x daily - 4 x weekly - 2 sets - 10 reps - Gentle Levator Scapulae Stretch  - 1 x daily - 4 x weekly - 1 sets - 3-5 reps - 30 sec - hold - Feet Together Balance at [employer] Eyes Open  - 1 x daily - 4 x weekly - 1 sets - 2-3 reps - 45 seconds to 1 minute hold - Corner Balance Feet Together: Eyes Open With Head Turns  - 1 x daily - 4 x weekly - 1 sets - 2 reps - 1 minute hold - Standing Balance in Corner with Eyes Closed  - 1 x daily - 4 x weekly - 1 sets - 2-3 reps - 45 seconds to 1 minute hold

## 2022-11-19 ENCOUNTER — Other Ambulatory Visit: Payer: Self-pay

## 2022-11-19 ENCOUNTER — Other Ambulatory Visit (HOSPITAL_COMMUNITY): Payer: Self-pay

## 2022-11-19 ENCOUNTER — Telehealth: Payer: Self-pay | Admitting: Pharmacist

## 2022-11-19 NOTE — Telephone Encounter (Signed)
I spoke with patient. The pharmacy had been trying to call her, but she had just spoken to them prior to me calling. They will deliver her pill packs tomorrow. She is out of a few meds including her amlodipine and atorvastatin. Advised to be sure she takes her carvedilol and resume the other meds as soon as she gets them tomorrow.

## 2022-11-20 ENCOUNTER — Telehealth: Payer: Self-pay | Admitting: Pharmacist

## 2022-11-20 NOTE — Telephone Encounter (Signed)
Patient is calling to talk with Melissa or another pharmacist in regards to her pill packs that she received.

## 2022-11-21 ENCOUNTER — Other Ambulatory Visit: Payer: Self-pay

## 2022-11-21 ENCOUNTER — Ambulatory Visit: Payer: Medicare PPO | Admitting: Physical Therapy

## 2022-11-21 DIAGNOSIS — H532 Diplopia: Secondary | ICD-10-CM | POA: Diagnosis not present

## 2022-11-21 DIAGNOSIS — H5111 Convergence insufficiency: Secondary | ICD-10-CM | POA: Diagnosis not present

## 2022-11-21 NOTE — Telephone Encounter (Signed)
Called and answered pt questions The only things not in pill pack are donepezil (filled at [facility], no refills) and gabapentin (not due to be filled, no refill on rx) She keeps getting confused about her mirabegron (saying she isnt supposed to take bc she is on carvedilol, but that is her metoprolol) [facility] did not fill bc patient said she wasn't taking Advised patient that next time she needs a refill on something to call [place] [facility]. All her medications should be filled at [facility] now and she should tell her PCP that all meds should be sent to [place].    I will fax Dr. Loleta Chance office to see if they can change her preferred pharmacy to [facility] since they will do her med boxes and he can d/c meds from our office so that they can't get filled by mistake.

## 2022-11-26 ENCOUNTER — Ambulatory Visit: Payer: Medicare PPO | Attending: Family Medicine | Admitting: Physical Therapy

## 2022-11-26 ENCOUNTER — Encounter: Payer: Self-pay | Admitting: Physical Therapy

## 2022-11-26 VITALS — BP 138/59 | HR 64

## 2022-11-26 DIAGNOSIS — R2681 Unsteadiness on feet: Secondary | ICD-10-CM | POA: Insufficient documentation

## 2022-11-26 DIAGNOSIS — H8111 Benign paroxysmal vertigo, right ear: Secondary | ICD-10-CM | POA: Insufficient documentation

## 2022-11-26 DIAGNOSIS — R2689 Other abnormalities of gait and mobility: Secondary | ICD-10-CM | POA: Diagnosis not present

## 2022-11-26 DIAGNOSIS — M6281 Muscle weakness (generalized): Secondary | ICD-10-CM | POA: Diagnosis not present

## 2022-11-26 DIAGNOSIS — R42 Dizziness and giddiness: Secondary | ICD-10-CM | POA: Diagnosis not present

## 2022-11-26 DIAGNOSIS — M79605 Pain in left leg: Secondary | ICD-10-CM | POA: Insufficient documentation

## 2022-11-26 DIAGNOSIS — M79602 Pain in left arm: Secondary | ICD-10-CM | POA: Diagnosis not present

## 2022-11-26 NOTE — Patient Instructions (Signed)
Gaze Stabilization: Sitting    Keeping eyes on target on wall a few tilt head down 15-30 and move head side to side for __30__ seconds. Repeat while moving head up and down for _30___ seconds. Perform 2-3 sets of each.  Do __2__ sessions per day.   Gaze Stabilization: Tip Card  1.Target must remain in focus, not blurry, and appear stationary while head is in motion. 2.Perform exercises with small head movements (45 to either side of midline). 3.Increase speed of head motion so long as target is in focus. 4.If you wear eyeglasses, be sure you can see target through lens (therapist will give specific instructions for bifocal / progressive lenses). 5.These exercises may provoke dizziness or nausea. Work through these symptoms. If too dizzy, slow head movement slightly. Rest between each exercise. 6.Exercises demand concentration; avoid distractions. 7.For safety, perform standing exercises close to a counter, wall, corner, or next to someone.
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
## 2022-11-28 ENCOUNTER — Ambulatory Visit: Payer: Medicare PPO | Admitting: Physical Therapy

## 2022-11-28 ENCOUNTER — Encounter: Payer: Self-pay | Admitting: Physical Therapy

## 2022-11-28 DIAGNOSIS — R2681 Unsteadiness on feet: Secondary | ICD-10-CM | POA: Diagnosis not present

## 2022-11-28 DIAGNOSIS — R2689 Other abnormalities of gait and mobility: Secondary | ICD-10-CM

## 2022-11-28 DIAGNOSIS — H8111 Benign paroxysmal vertigo, right ear: Secondary | ICD-10-CM | POA: Diagnosis not present

## 2022-11-28 DIAGNOSIS — M6281 Muscle weakness (generalized): Secondary | ICD-10-CM

## 2022-11-28 DIAGNOSIS — M79605 Pain in left leg: Secondary | ICD-10-CM | POA: Diagnosis not present

## 2022-11-28 DIAGNOSIS — R42 Dizziness and giddiness: Secondary | ICD-10-CM

## 2022-11-28 DIAGNOSIS — M79602 Pain in left arm: Secondary | ICD-10-CM | POA: Diagnosis not present
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
## 2022-12-02 ENCOUNTER — Ambulatory Visit: Payer: Medicare PPO | Attending: Cardiovascular Disease | Admitting: Pharmacist

## 2022-12-02 VITALS — BP 140/60 | HR 64

## 2022-12-02 DIAGNOSIS — I10 Essential (primary) hypertension: Secondary | ICD-10-CM | POA: Diagnosis not present

## 2022-12-02 NOTE — Patient Instructions (Addendum)
Your blood pressure goal is < 130/18mmHg   Please take your second dose of hydralazine around 4-5 PM Please check your blood pressure at home  Continue amlodipine 10mg  daily, carvedilol 6.25mg  twice a day, furosemide 10mg  daily and hydralazine 25mg  three times a day  Please call Dr. Loleta Chance with your concerns about dizziness/concussion/focus. You should get back in with your neurologist.  Important lifestyle changes to control high blood pressure  Intervention  Effect on the BP   Weight loss Weight loss is one of the most effective lifestyle changes for controlling blood pressure. If you're overweight or obese, losing even a small amount of weight can help reduce blood pressure.    Blood pressure can decrease by 1 millimeter of mercury (mmHg) with each kilogram (about 2.2 pounds) of weight lost.   Exercise regularly As a general goal, aim for 30 minutes of moderate physical activity every day.    Regular physical activity can lower blood pressure by 5 - 8 mmHg.   Eat a healthy diet Eat a diet rich in whole grains, fruits, vegetables, lean meat, and low-fat dairy products. Limit processed foods, saturated fat, and sweets.    A heart-healthy diet can lower high blood pressure by 10 mmHg.   Reduce salt (sodium) in your diet Aim for 000mg  of sodium each day. Avoid deli meats, canned food, and frozen microwave meals which are high in sodium.     Limiting sodium can reduce blood pressure by 5 mmHg.   Limit alcohol One drink equals 12 ounces of beer, 5 ounces of wine, or 1.5 ounces of 80-proof liquor.    Limiting alcohol to < 1 drink a day for women or < 2 drinks a day for men can help lower blood pressure by about 4 mmHg.   To check your pressure at home you will need to:   Sit up in a chair, with feet flat on the floor and back supported. Do not cross your ankles or legs. Rest your left arm so that the cuff is about heart level. If the cuff goes on your upper arm, then just  relax your arm on the table, arm of the chair, or your lap. If you have a wrist cuff, hold your wrist against your chest at heart level. Place the cuff snugly around your arm, about 1 inch above the crease of your elbow. The cords should be inside the groove of your elbow.  Sit quietly, with the cuff in place, for about 5 minutes. Then press the power button to start a reading. Do not talk or move while the reading is taking place.  Record your readings on a sheet of paper. Although most cuffs have a memory, it is often easier to see a pattern developing when the numbers are all in front of you.  You can repeat the reading after 1-3 minutes if it is recommended.   Make sure your bladder is empty and you have not had caffeine or tobacco within the last 30 minutes   Always bring your blood pressure log with you to your appointments. If you have not brought your monitor in to be double checked for accuracy, please bring it to your next appointment.   You can find a list of validated (accurate) blood pressure cuffs at: validatebp.org

## 2022-12-03 ENCOUNTER — Ambulatory Visit: Payer: Medicare PPO | Admitting: Physical Therapy

## 2022-12-03 ENCOUNTER — Encounter: Payer: Self-pay | Admitting: Physical Therapy

## 2022-12-03 DIAGNOSIS — H8111 Benign paroxysmal vertigo, right ear: Secondary | ICD-10-CM | POA: Diagnosis not present

## 2022-12-03 DIAGNOSIS — R1012 Left upper quadrant pain: Secondary | ICD-10-CM | POA: Diagnosis not present

## 2022-12-03 DIAGNOSIS — M79602 Pain in left arm: Secondary | ICD-10-CM | POA: Diagnosis not present

## 2022-12-03 DIAGNOSIS — M6281 Muscle weakness (generalized): Secondary | ICD-10-CM | POA: Diagnosis not present

## 2022-12-03 DIAGNOSIS — R2689 Other abnormalities of gait and mobility: Secondary | ICD-10-CM | POA: Diagnosis not present

## 2022-12-03 DIAGNOSIS — M542 Cervicalgia: Secondary | ICD-10-CM | POA: Diagnosis not present

## 2022-12-03 DIAGNOSIS — R42 Dizziness and giddiness: Secondary | ICD-10-CM | POA: Diagnosis not present

## 2022-12-03 DIAGNOSIS — M79605 Pain in left leg: Secondary | ICD-10-CM | POA: Diagnosis not present

## 2022-12-03 DIAGNOSIS — M79652 Pain in left thigh: Secondary | ICD-10-CM | POA: Diagnosis not present

## 2022-12-03 DIAGNOSIS — R2681 Unsteadiness on feet: Secondary | ICD-10-CM
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
## 2022-12-05 ENCOUNTER — Ambulatory Visit: Payer: Medicare PPO | Admitting: Physical Therapy

## 2022-12-05 ENCOUNTER — Encounter: Payer: Self-pay | Admitting: Physical Therapy

## 2022-12-05 VITALS — BP 143/58 | HR 60

## 2022-12-05 DIAGNOSIS — M79605 Pain in left leg: Secondary | ICD-10-CM | POA: Diagnosis not present

## 2022-12-05 DIAGNOSIS — R2681 Unsteadiness on feet: Secondary | ICD-10-CM | POA: Diagnosis not present

## 2022-12-05 DIAGNOSIS — R42 Dizziness and giddiness: Secondary | ICD-10-CM

## 2022-12-05 DIAGNOSIS — M6281 Muscle weakness (generalized): Secondary | ICD-10-CM

## 2022-12-05 DIAGNOSIS — R2689 Other abnormalities of gait and mobility: Secondary | ICD-10-CM | POA: Diagnosis not present

## 2022-12-05 DIAGNOSIS — M79602 Pain in left arm: Secondary | ICD-10-CM

## 2022-12-05 DIAGNOSIS — H8111 Benign paroxysmal vertigo, right ear: Secondary | ICD-10-CM | POA: Diagnosis not present
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
## 2022-12-10 ENCOUNTER — Ambulatory Visit: Payer: Medicare PPO | Admitting: Physical Therapy

## 2022-12-10 ENCOUNTER — Encounter: Payer: Self-pay | Admitting: Physical Therapy

## 2022-12-10 VITALS — BP 160/63 | HR 57

## 2022-12-10 DIAGNOSIS — M79602 Pain in left arm: Secondary | ICD-10-CM | POA: Diagnosis not present

## 2022-12-10 DIAGNOSIS — R2681 Unsteadiness on feet: Secondary | ICD-10-CM | POA: Diagnosis not present

## 2022-12-10 DIAGNOSIS — M6281 Muscle weakness (generalized): Secondary | ICD-10-CM | POA: Diagnosis not present

## 2022-12-10 DIAGNOSIS — I10 Essential (primary) hypertension: Secondary | ICD-10-CM | POA: Diagnosis not present

## 2022-12-10 DIAGNOSIS — H8111 Benign paroxysmal vertigo, right ear: Secondary | ICD-10-CM | POA: Diagnosis not present

## 2022-12-10 DIAGNOSIS — R2689 Other abnormalities of gait and mobility: Secondary | ICD-10-CM

## 2022-12-10 DIAGNOSIS — M79605 Pain in left leg: Secondary | ICD-10-CM | POA: Diagnosis not present

## 2022-12-10 DIAGNOSIS — R42 Dizziness and giddiness: Secondary | ICD-10-CM | POA: Diagnosis not present

## 2022-12-10 DIAGNOSIS — Z6829 Body mass index (BMI) 29.0-29.9, adult: Secondary | ICD-10-CM | POA: Diagnosis not present

## 2022-12-10 DIAGNOSIS — E1169 Type 2 diabetes mellitus with other specified complication: Secondary | ICD-10-CM | POA: Diagnosis not present

## 2022-12-10 DIAGNOSIS — R410 Disorientation, unspecified: Secondary | ICD-10-CM | POA: Diagnosis not present

## 2022-12-10 DIAGNOSIS — N184 Chronic kidney disease, stage 4 (severe): Secondary | ICD-10-CM | POA: Diagnosis not present

## 2022-12-10 DIAGNOSIS — E1165 Type 2 diabetes mellitus with hyperglycemia: Secondary | ICD-10-CM | POA: Diagnosis not present

## 2022-12-10 DIAGNOSIS — I129 Hypertensive chronic kidney disease with stage 1 through stage 4 chronic kidney disease, or unspecified chronic kidney disease: Secondary | ICD-10-CM | POA: Diagnosis not present
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
## 2022-12-12 ENCOUNTER — Encounter: Payer: Self-pay | Admitting: Physical Therapy

## 2022-12-12 ENCOUNTER — Ambulatory Visit: Payer: Medicare PPO | Admitting: Physical Therapy

## 2022-12-12 VITALS — BP 152/48 | HR 54

## 2022-12-12 DIAGNOSIS — H8111 Benign paroxysmal vertigo, right ear: Secondary | ICD-10-CM | POA: Diagnosis not present

## 2022-12-12 DIAGNOSIS — M6281 Muscle weakness (generalized): Secondary | ICD-10-CM | POA: Diagnosis not present

## 2022-12-12 DIAGNOSIS — R42 Dizziness and giddiness: Secondary | ICD-10-CM

## 2022-12-12 DIAGNOSIS — R2681 Unsteadiness on feet: Secondary | ICD-10-CM | POA: Diagnosis not present

## 2022-12-12 DIAGNOSIS — M79602 Pain in left arm: Secondary | ICD-10-CM | POA: Diagnosis not present

## 2022-12-12 DIAGNOSIS — R2689 Other abnormalities of gait and mobility: Secondary | ICD-10-CM | POA: Diagnosis not present

## 2022-12-12 DIAGNOSIS — M79605 Pain in left leg: Secondary | ICD-10-CM | POA: Diagnosis not present
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
## 2022-12-16 ENCOUNTER — Other Ambulatory Visit (HOSPITAL_COMMUNITY): Payer: Self-pay

## 2022-12-16 ENCOUNTER — Other Ambulatory Visit: Payer: Self-pay

## 2022-12-16 MED ORDER — INSULIN DEGLUDEC 200 UNIT/ML ~~LOC~~ SOPN
70.0000 [IU] | PEN_INJECTOR | Freq: Every day | SUBCUTANEOUS | 6 refills | Status: DC
  Filled 2022-12-16 – 2022-12-30 (×2): qty 9, 26d supply, fill #0
  Filled 2023-01-27: qty 9, 26d supply, fill #1

## 2022-12-16 MED ORDER — ISOSORB DINITRATE-HYDRALAZINE 20-37.5 MG PO TABS
1.0000 | ORAL_TABLET | Freq: Two times a day (BID) | ORAL | 3 refills | Status: DC
  Filled 2023-01-09: qty 60, 30d supply, fill #0

## 2022-12-16 MED ORDER — AZITHROMYCIN 250 MG PO TABS: ORAL_TABLET | ORAL | 0 refills | Status: AC

## 2022-12-16 MED ORDER — DAPAGLIFLOZIN PROPANEDIOL 10 MG PO TABS: 10.0000 mg | ORAL_TABLET | Freq: Every day | ORAL | 1 refills | Status: DC

## 2022-12-16 MED ORDER — GABAPENTIN 300 MG PO CAPS
300.0000 mg | ORAL_CAPSULE | Freq: Two times a day (BID) | ORAL | 1 refills | Status: DC
  Filled 2022-12-16 – 2023-01-09 (×2): qty 60, 30d supply, fill #0
  Filled 2023-02-06: qty 60, 30d supply, fill #1
  Filled 2023-02-08: qty 6, 3d supply, fill #2

## 2022-12-16 MED ORDER — DICYCLOMINE HCL 10 MG PO CAPS
10.0000 mg | ORAL_CAPSULE | Freq: Three times a day (TID) | ORAL | 1 refills | Status: DC
  Filled 2022-12-16 – 2023-01-09 (×2): qty 90, 30d supply, fill #0

## 2022-12-16 MED ORDER — METOPROLOL SUCCINATE ER 100 MG PO TB24
100.0000 mg | ORAL_TABLET | Freq: Every day | ORAL | 0 refills | Status: DC
  Filled 2023-01-09: qty 30, 30d supply, fill #0

## 2022-12-17 ENCOUNTER — Ambulatory Visit: Payer: Medicare PPO | Admitting: Physical Therapy

## 2022-12-17 ENCOUNTER — Other Ambulatory Visit: Payer: Self-pay

## 2022-12-17 ENCOUNTER — Other Ambulatory Visit (HOSPITAL_COMMUNITY): Payer: Self-pay

## 2022-12-17 ENCOUNTER — Telehealth: Payer: Self-pay

## 2022-12-17 ENCOUNTER — Encounter: Payer: Self-pay | Admitting: Physical Therapy

## 2022-12-17 ENCOUNTER — Other Ambulatory Visit: Payer: Self-pay | Admitting: Neurology

## 2022-12-17 VITALS — BP 147/65 | HR 60

## 2022-12-17 DIAGNOSIS — H8111 Benign paroxysmal vertigo, right ear: Secondary | ICD-10-CM

## 2022-12-17 DIAGNOSIS — R42 Dizziness and giddiness: Secondary | ICD-10-CM

## 2022-12-17 DIAGNOSIS — M6281 Muscle weakness (generalized): Secondary | ICD-10-CM | POA: Diagnosis not present

## 2022-12-17 DIAGNOSIS — M79605 Pain in left leg: Secondary | ICD-10-CM | POA: Diagnosis not present

## 2022-12-17 DIAGNOSIS — R2689 Other abnormalities of gait and mobility: Secondary | ICD-10-CM

## 2022-12-17 DIAGNOSIS — R2681 Unsteadiness on feet: Secondary | ICD-10-CM | POA: Diagnosis not present

## 2022-12-17 DIAGNOSIS — M79602 Pain in left arm: Secondary | ICD-10-CM | POA: Diagnosis not present
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
## 2022-12-17 NOTE — Telephone Encounter (Signed)
-----   Message from Armanda Magic sent at 10/21/2022  9:50 AM EDT ----- Please find out if she was fasting for this

## 2022-12-17 NOTE — Telephone Encounter (Signed)
Called to discuss lipid panel results, no answer, unable to leave message as VM was full. Letter sent.

## 2022-12-18 ENCOUNTER — Other Ambulatory Visit: Payer: Self-pay

## 2022-12-18 ENCOUNTER — Encounter (INDEPENDENT_AMBULATORY_CARE_PROVIDER_SITE_OTHER): Payer: Medicare PPO | Admitting: Ophthalmology

## 2022-12-18 DIAGNOSIS — H43813 Vitreous degeneration, bilateral: Secondary | ICD-10-CM | POA: Diagnosis not present

## 2022-12-18 DIAGNOSIS — H35033 Hypertensive retinopathy, bilateral: Secondary | ICD-10-CM | POA: Diagnosis not present

## 2022-12-18 DIAGNOSIS — I10 Essential (primary) hypertension: Secondary | ICD-10-CM | POA: Diagnosis not present

## 2022-12-18 DIAGNOSIS — Z794 Long term (current) use of insulin: Secondary | ICD-10-CM | POA: Diagnosis not present

## 2022-12-18 DIAGNOSIS — E113393 Type 2 diabetes mellitus with moderate nonproliferative diabetic retinopathy without macular edema, bilateral: Secondary | ICD-10-CM | POA: Diagnosis not present

## 2022-12-19 ENCOUNTER — Ambulatory Visit: Payer: Medicare PPO | Admitting: Physical Therapy

## 2022-12-19 ENCOUNTER — Other Ambulatory Visit (HOSPITAL_COMMUNITY): Payer: Self-pay

## 2022-12-19 ENCOUNTER — Telehealth: Payer: Self-pay | Admitting: Pharmacist

## 2022-12-19 NOTE — Telephone Encounter (Signed)
Called and spoke with patient and advised meds will be delivered today

## 2022-12-19 NOTE — Telephone Encounter (Signed)
Spoke to [facility], medication is en-route and should be delivered today.   If she doesn't receive today, she can call them to check status.

## 2022-12-19 NOTE — Telephone Encounter (Signed)
Patient stated she is on [facility] and her medications were not delivered and she has no medications on hand.

## 2022-12-19 NOTE — Telephone Encounter (Signed)
Patient states she is returning an additional call regarding this matter.

## 2022-12-20 ENCOUNTER — Other Ambulatory Visit (HOSPITAL_COMMUNITY): Payer: Self-pay

## 2022-12-24 ENCOUNTER — Encounter: Payer: Self-pay | Admitting: Physical Therapy

## 2022-12-24 ENCOUNTER — Ambulatory Visit: Payer: Medicare PPO | Attending: Family Medicine | Admitting: Physical Therapy

## 2022-12-24 DIAGNOSIS — M6281 Muscle weakness (generalized): Secondary | ICD-10-CM | POA: Diagnosis not present

## 2022-12-24 DIAGNOSIS — R2689 Other abnormalities of gait and mobility: Secondary | ICD-10-CM | POA: Diagnosis not present

## 2022-12-24 DIAGNOSIS — M79605 Pain in left leg: Secondary | ICD-10-CM | POA: Insufficient documentation

## 2022-12-24 DIAGNOSIS — R2681 Unsteadiness on feet: Secondary | ICD-10-CM | POA: Diagnosis not present

## 2022-12-24 DIAGNOSIS — R42 Dizziness and giddiness: Secondary | ICD-10-CM | POA: Insufficient documentation

## 2022-12-24 DIAGNOSIS — M79602 Pain in left arm: Secondary | ICD-10-CM | POA: Diagnosis not present

## 2022-12-24 DIAGNOSIS — H8111 Benign paroxysmal vertigo, right ear: Secondary | ICD-10-CM | POA: Insufficient documentation
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
## 2022-12-26 ENCOUNTER — Encounter: Payer: Self-pay | Admitting: Physical Therapy

## 2022-12-26 ENCOUNTER — Ambulatory Visit: Payer: Medicare PPO | Admitting: Physical Therapy

## 2022-12-26 DIAGNOSIS — M6281 Muscle weakness (generalized): Secondary | ICD-10-CM

## 2022-12-26 DIAGNOSIS — R2689 Other abnormalities of gait and mobility: Secondary | ICD-10-CM

## 2022-12-26 DIAGNOSIS — R42 Dizziness and giddiness: Secondary | ICD-10-CM

## 2022-12-26 DIAGNOSIS — R2681 Unsteadiness on feet: Secondary | ICD-10-CM

## 2022-12-26 NOTE — Therapy (Signed)
[facility] [address] [place], [place], [address] Phone: [phone]   Fax:  [phone]  Patient Details  Name: [name] MRN: [id number] Date of Birth: 1940-08-18 Referring Provider:  Mirna Mires, MD  Encounter Date: 12/26/2022  Session was arrive no charge. Patient reports that last known glucose reading was 70 this morning. She has not assessed glucose since and thinks it may now be high but does not have a method of reading glucose on her. Educated on safe ranges for glucose in therapy 70-250 and advised patient to bring glucose monitor to future sessions for safety.   Carmelia Bake, PT, DPT 12/26/2022, 1:13 PM  [facility] [address] [place], [place], [address] Phone: [phone]   Fax:  [phone]

## 2022-12-27 ENCOUNTER — Encounter: Payer: Self-pay | Admitting: Physical Therapy

## 2022-12-27 ENCOUNTER — Ambulatory Visit: Payer: Medicare PPO | Admitting: Physical Therapy

## 2022-12-27 VITALS — BP 151/69 | HR 61

## 2022-12-27 DIAGNOSIS — H8111 Benign paroxysmal vertigo, right ear: Secondary | ICD-10-CM

## 2022-12-27 DIAGNOSIS — M79605 Pain in left leg: Secondary | ICD-10-CM

## 2022-12-27 DIAGNOSIS — R2689 Other abnormalities of gait and mobility: Secondary | ICD-10-CM | POA: Diagnosis not present

## 2022-12-27 DIAGNOSIS — M79602 Pain in left arm: Secondary | ICD-10-CM

## 2022-12-27 DIAGNOSIS — R2681 Unsteadiness on feet: Secondary | ICD-10-CM | POA: Diagnosis not present

## 2022-12-27 DIAGNOSIS — M6281 Muscle weakness (generalized): Secondary | ICD-10-CM

## 2022-12-27 DIAGNOSIS — R42 Dizziness and giddiness: Secondary | ICD-10-CM
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
## 2022-12-29 IMAGING — CT CT RENAL STONE PROTOCOL
2 of 4 series · 16 of 46 positions shown, 18 images · non-contrast
Comparison: None.

CLINICAL DATA: Right-sided flank pain

EXAM:
CT ABDOMEN AND PELVIS WITHOUT CONTRAST
TECHNIQUE: Multidetector CT imaging of the abdomen and pelvis was performed
following the standard protocol without IV contrast.

[Series 2: axial st · axial · 0.82mm/px · z∈[+1102,+1438]mm · 13 of 77 slices shown, 15 images]
[im 5/77  soft-tissue]
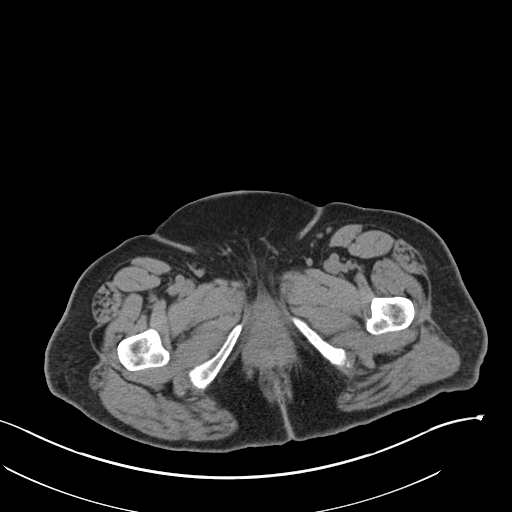
[im 5/77  bone]
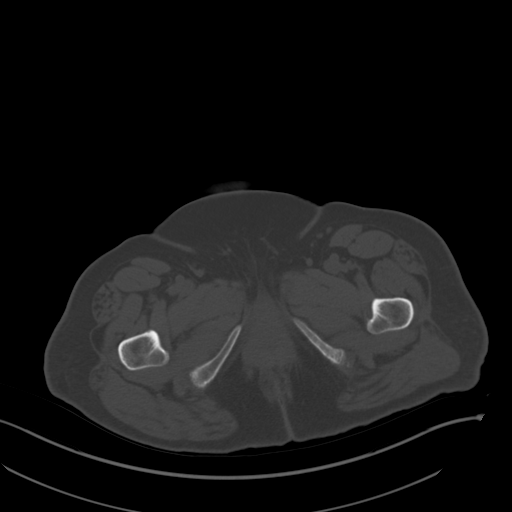
[im 9/77  soft-tissue]
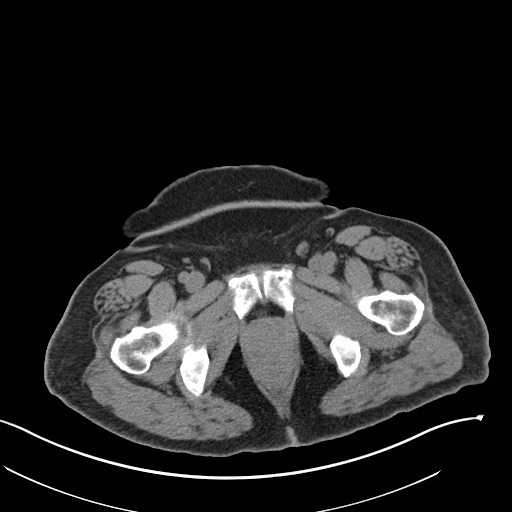
[im 18/77  soft-tissue]
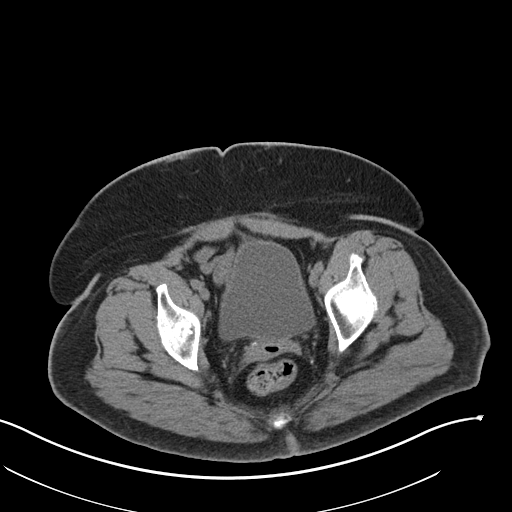
[im 23/77  soft-tissue]
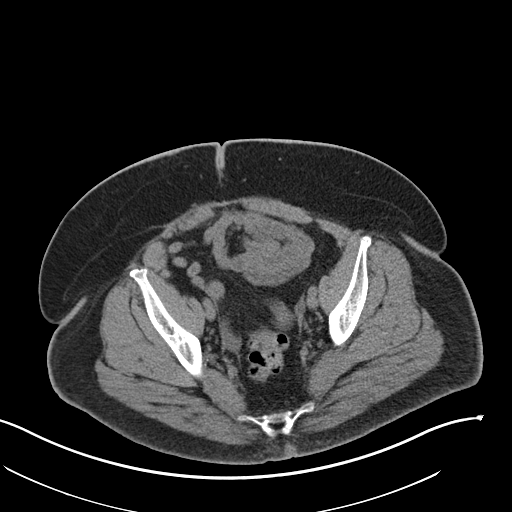
[im 27/77  soft-tissue]
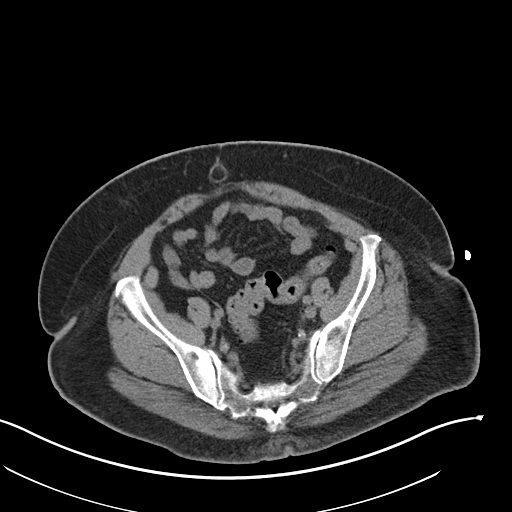
[im 32/77  soft-tissue]
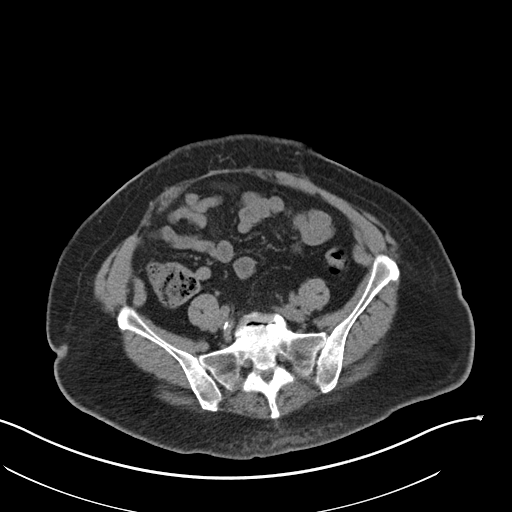
[im 41/77  soft-tissue]
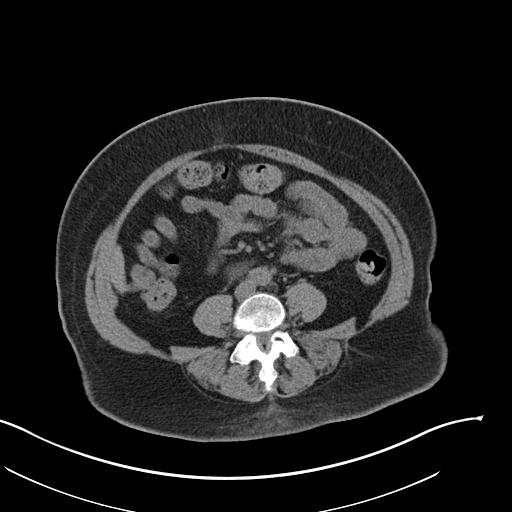
[im 45/77  soft-tissue]
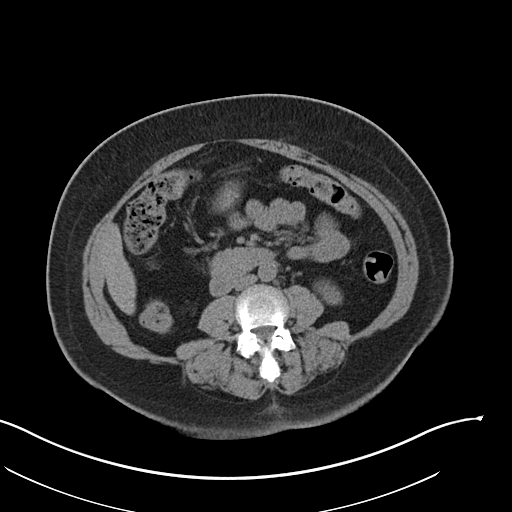
[im 50/77  soft-tissue]
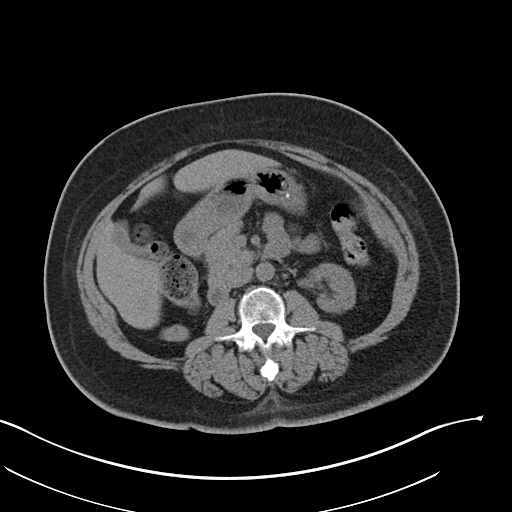
[im 50/77  bone]
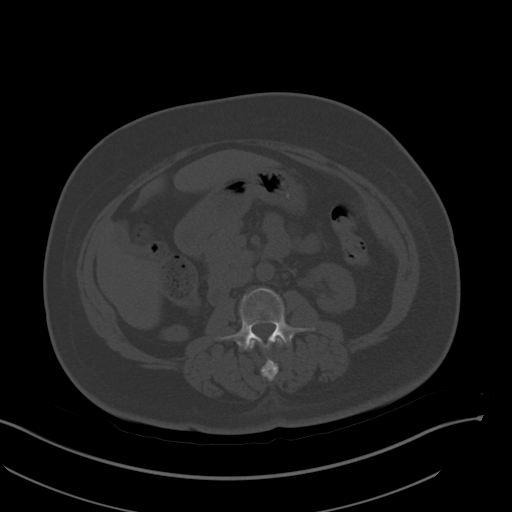
[im 54/77  soft-tissue]
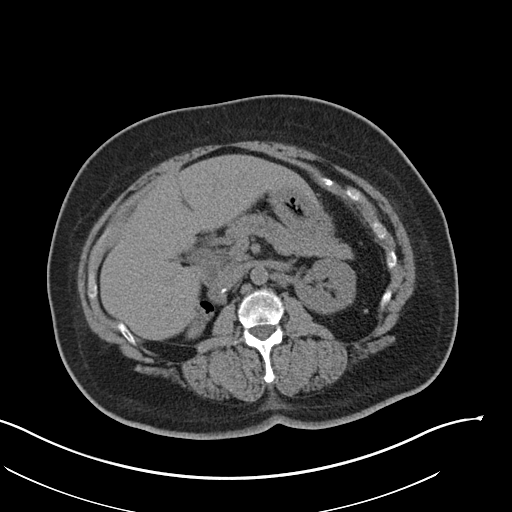
[im 59/77  soft-tissue]
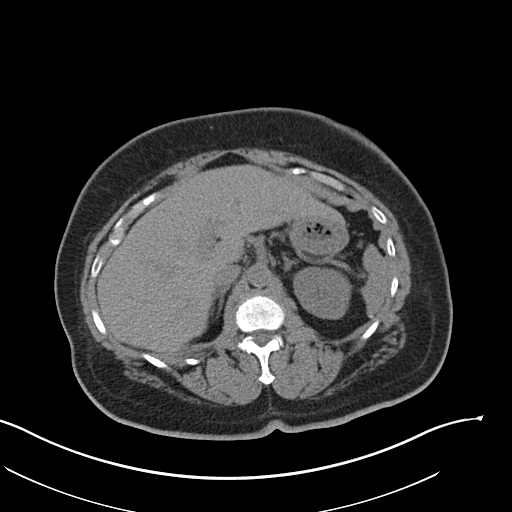
[im 68/77  soft-tissue]
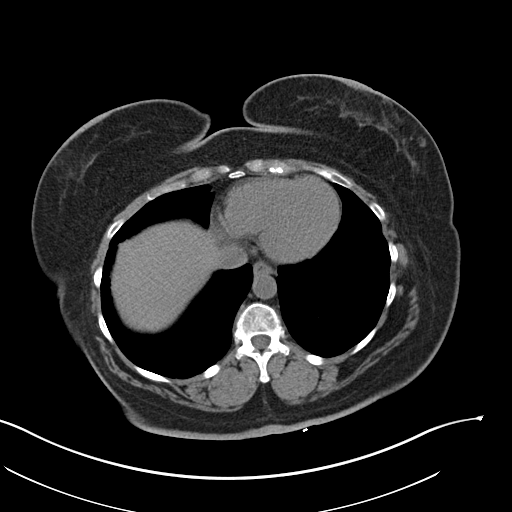
[im 72/77  soft-tissue]
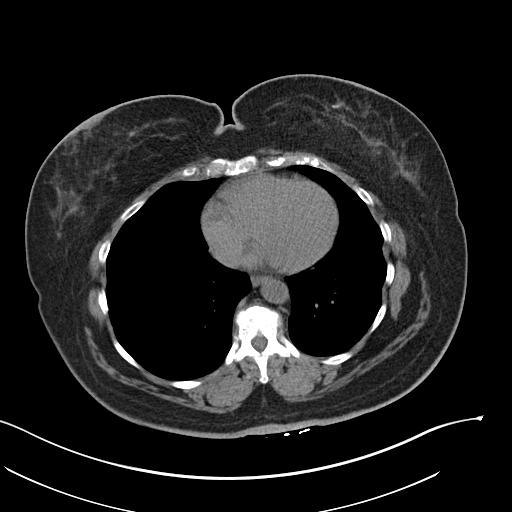

[Series 5: coronal · coronal · 0.64mm/px · 3 of 151 slices shown]
[im 51/151  soft-tissue]
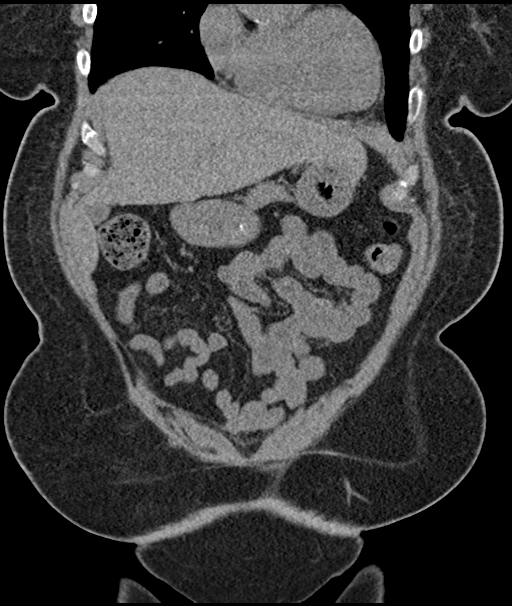
[im 67/151  soft-tissue]
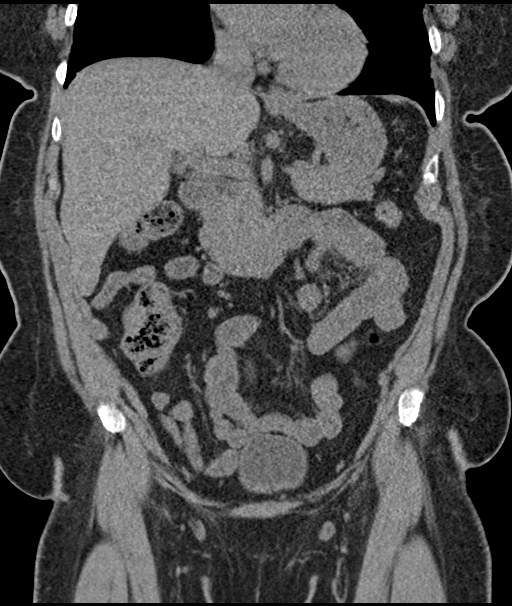
[im 84/151  soft-tissue]
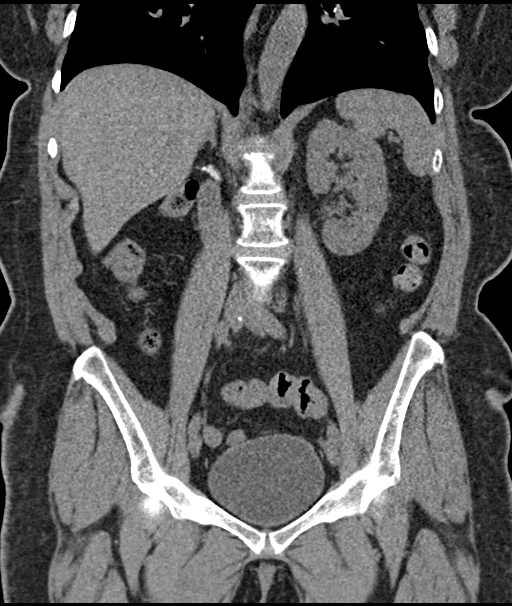

[16 of 46 positions shown; findings below may reference images not displayed]

FINDINGS: Lower chest: The visualized heart size within normal limits. No
pericardial fluid/thickening.

No hiatal hernia.

The visualized portions of the lungs are clear.

Hepatobiliary: Although limited due to the lack of intravenous
contrast, normal in appearance without gross focal abnormality. No
evidence of calcified gallstones or biliary ductal dilatation.

Pancreas:  Unremarkable.  No surrounding inflammatory changes.

Spleen: Normal in size. Although limited due to the lack of
intravenous contrast, normal in appearance.

Adrenals/Urinary Tract: The patient is status post right-sided
nephrectomy. No left-sided renal collecting system calculi. Bladder
is unremarkable.

Stomach/Bowel: The stomach, small bowel, and colon are normal in
appearance. No inflammatory changes or obstructive findings.
Scattered diverticula are noted.

Vascular/Lymphatic: There are no enlarged abdominal or pelvic lymph
nodes. Scattered aortic atherosclerotic calcifications are seen
without aneurysmal dilatation.

Reproductive: The patient is status post hysterectomy. No adnexal
masses or collections seen.

Other: No evidence of abdominal wall mass or hernia.

Musculoskeletal: No acute or significant osseous findings.
IMPRESSION: Status post right-sided nephrectomy. No renal or collecting system
calculi.

No other acute intra-abdominopelvic pathology to explain the
patient's symptoms.

Aortic Atherosclerosis (VD1AA-3TR.R).

## 2022-12-30 ENCOUNTER — Other Ambulatory Visit (HOSPITAL_COMMUNITY): Payer: Self-pay

## 2022-12-30 ENCOUNTER — Ambulatory Visit: Payer: Medicare PPO | Attending: Cardiology | Admitting: Pharmacist

## 2022-12-30 ENCOUNTER — Other Ambulatory Visit: Payer: Self-pay

## 2022-12-30 VITALS — BP 150/58 | HR 64

## 2022-12-30 DIAGNOSIS — I10 Essential (primary) hypertension: Secondary | ICD-10-CM

## 2022-12-30 NOTE — Patient Instructions (Addendum)
Your blood pressure is slightly elevated but stable, so we are there are no changes to your blood pressure medications for now. Continue to take amlodipine 10 mg daily, carvedilol 6.25 mg twice a day, furosemide 10 mg daily, hydralazine 25 mg three timed daily.  Your current medication schedule should be: Morning, around 8:00 AM: amlodipine 10 mg, carvedilol 12.5 mg, furosemide 10 mg, hydralazine 25 mg, atorvastatin 10 mg, cholecalciferol 1000 units, dapagliflozin 10 mg, gabapentin 300 mg Afternoon, around 2:00 to 4:00 PM: hydralazine 25 mg Evening, around 8:00 PM: carvedilol 12.5 mg, hydralazine 25 mg, aspirin 81 mg, gabapentin 300 mg  Please remember to take your afternoon dose of hydralazine (around 2:00 to 4:00 PM)   Please remember to check your blood pressures at home and record the readings to bring in at your next visit.   When you get home check to see if furosemide is in your most recent pill pack shipment that was sent to you. It should be a half tablet of furosemide 10 mg that you take every day in the morning.   All of your medications can be picked up at [facility] if you ever don't want them mailed to you. Here is their address: [address] [place], [place] [address].   Medications that our out of refills, need to call [facility] for refills: Gabapentin    Medications to call [facility] to have filled/transferred, their phone number is [phone]: Gabapentin (will get shipped 11/30) Call prior to this date Donepezil (will get shipped 12/30) Call prior to this date   Thanks for coming in today!   Wilmer Floor, PharmD PGY2 Cardiology Pharmacy Resident

## 2022-12-30 NOTE — Assessment & Plan Note (Addendum)
Patient blood pressure is slightly above her goal of <140/90 according to readings in office (142/60 and 150/58) Continue amlodipine 10mg  daily, carvedilol 6.25mg  twice a day, furosemide 10mg  daily, hydralazine 25 mg three timed daily Encouraged patient to remember to take her afternoon dose of hydralazine by storing it in her purse when she goes out Encouraged patient to resume checking blood pressures at home and recording them to bring in with her next time Follow-up in two weeks
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
## 2022-12-31 ENCOUNTER — Ambulatory Visit: Payer: Medicare PPO | Admitting: Physical Therapy

## 2022-12-31 DIAGNOSIS — R410 Disorientation, unspecified: Secondary | ICD-10-CM | POA: Diagnosis not present

## 2022-12-31 DIAGNOSIS — H6123 Impacted cerumen, bilateral: Secondary | ICD-10-CM | POA: Diagnosis not present

## 2022-12-31 DIAGNOSIS — E1165 Type 2 diabetes mellitus with hyperglycemia: Secondary | ICD-10-CM | POA: Diagnosis not present

## 2023-01-01 ENCOUNTER — Ambulatory Visit: Payer: Medicare PPO | Admitting: Physical Therapy

## 2023-01-01 VITALS — BP 153/56 | HR 59

## 2023-01-01 DIAGNOSIS — H8111 Benign paroxysmal vertigo, right ear: Secondary | ICD-10-CM | POA: Diagnosis not present

## 2023-01-01 DIAGNOSIS — R2689 Other abnormalities of gait and mobility: Secondary | ICD-10-CM | POA: Diagnosis not present

## 2023-01-01 DIAGNOSIS — M79602 Pain in left arm: Secondary | ICD-10-CM

## 2023-01-01 DIAGNOSIS — M6281 Muscle weakness (generalized): Secondary | ICD-10-CM | POA: Diagnosis not present

## 2023-01-01 DIAGNOSIS — M79605 Pain in left leg: Secondary | ICD-10-CM

## 2023-01-01 DIAGNOSIS — R42 Dizziness and giddiness: Secondary | ICD-10-CM

## 2023-01-01 DIAGNOSIS — R2681 Unsteadiness on feet: Secondary | ICD-10-CM | POA: Diagnosis not present
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
## 2023-01-02 ENCOUNTER — Encounter: Payer: Self-pay | Admitting: Physical Therapy

## 2023-01-02 ENCOUNTER — Ambulatory Visit: Payer: Medicare PPO | Admitting: Physical Therapy

## 2023-01-02 VITALS — BP 146/66 | HR 64

## 2023-01-02 DIAGNOSIS — R42 Dizziness and giddiness: Secondary | ICD-10-CM

## 2023-01-02 DIAGNOSIS — M6281 Muscle weakness (generalized): Secondary | ICD-10-CM | POA: Diagnosis not present

## 2023-01-02 DIAGNOSIS — R2689 Other abnormalities of gait and mobility: Secondary | ICD-10-CM

## 2023-01-02 DIAGNOSIS — H8111 Benign paroxysmal vertigo, right ear: Secondary | ICD-10-CM | POA: Diagnosis not present

## 2023-01-02 DIAGNOSIS — M79605 Pain in left leg: Secondary | ICD-10-CM | POA: Diagnosis not present

## 2023-01-02 DIAGNOSIS — M79602 Pain in left arm: Secondary | ICD-10-CM | POA: Diagnosis not present

## 2023-01-02 DIAGNOSIS — R2681 Unsteadiness on feet: Secondary | ICD-10-CM
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
## 2023-01-07 ENCOUNTER — Encounter: Payer: Self-pay | Admitting: Physical Therapy

## 2023-01-07 ENCOUNTER — Ambulatory Visit: Payer: Medicare PPO | Admitting: Physical Therapy

## 2023-01-07 DIAGNOSIS — R2689 Other abnormalities of gait and mobility: Secondary | ICD-10-CM

## 2023-01-07 DIAGNOSIS — R2681 Unsteadiness on feet: Secondary | ICD-10-CM

## 2023-01-07 DIAGNOSIS — M79602 Pain in left arm: Secondary | ICD-10-CM | POA: Diagnosis not present

## 2023-01-07 DIAGNOSIS — M6281 Muscle weakness (generalized): Secondary | ICD-10-CM | POA: Diagnosis not present

## 2023-01-07 DIAGNOSIS — M79605 Pain in left leg: Secondary | ICD-10-CM | POA: Diagnosis not present

## 2023-01-07 DIAGNOSIS — R42 Dizziness and giddiness: Secondary | ICD-10-CM

## 2023-01-07 DIAGNOSIS — H8111 Benign paroxysmal vertigo, right ear: Secondary | ICD-10-CM | POA: Diagnosis not present
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
## 2023-01-09 ENCOUNTER — Other Ambulatory Visit (HOSPITAL_COMMUNITY): Payer: Self-pay

## 2023-01-09 ENCOUNTER — Other Ambulatory Visit: Payer: Self-pay

## 2023-01-09 IMAGING — MR MR ABDOMEN W/O CM
9 series · 48 of 48 positions shown · non-contrast
Comparison: CT stone study 02/17/2020. MRI 01/20/2019

CLINICAL DATA: Right nephrectomy for renal cell carcinoma. Status
post partial left nephrectomy. Restaging.

EXAM:
MRI ABDOMEN WITHOUT CONTRAST
TECHNIQUE: Multiplanar multisequence MR imaging was performed without the
administration of intravenous contrast.

[Series 3: T2 · coronal · 6.0mm · 1.27mm/px · 4 of 33 slices shown (1 of 2)]
[im 1/33]
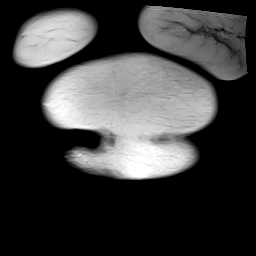
[im 11/33]
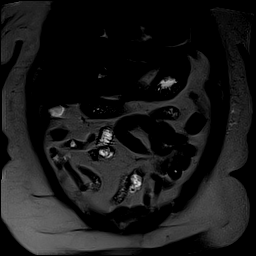
[im 22/33]
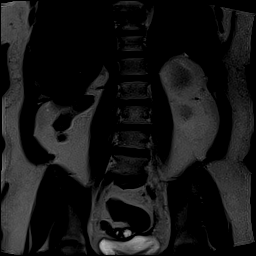
[im 33/33]
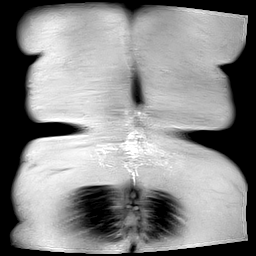

[Series 5: T2 · axial · 6.0mm · 1.27mm/px · z∈[+1,+217]mm · 4 of 31 slices shown (2 of 2)]
[im 1/31]
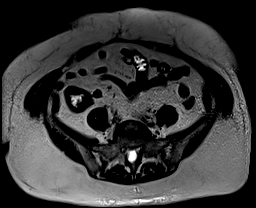
[im 11/31]
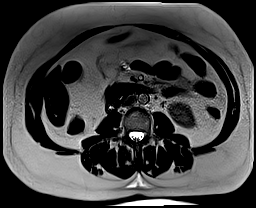
[im 21/31]
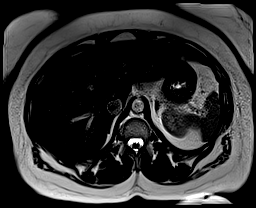
[im 31/31]
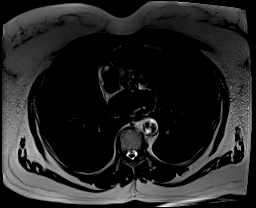

[Series 7: T2 fat-sat · axial · 6.0mm · 1.02mm/px · z∈[+17,+247]mm · 3 of 33 slices shown]
[im 1/33]
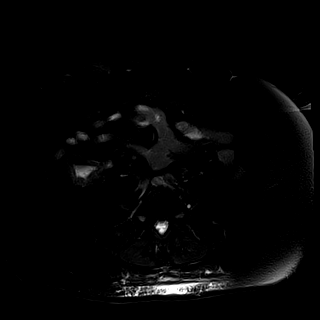
[im 17/33]
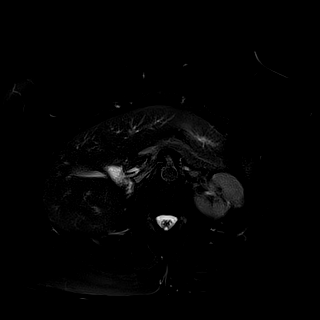
[im 33/33]
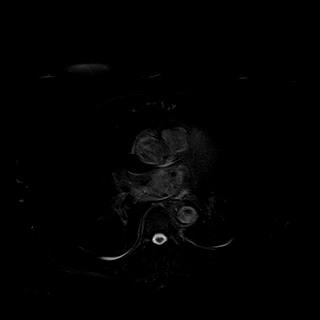

[Series 8: DWI · axial · 6.0mm · 1.36mm/px · z∈[+33,+256]mm · 6 of 64 slices shown (1 of 2)]
[im 1/64]
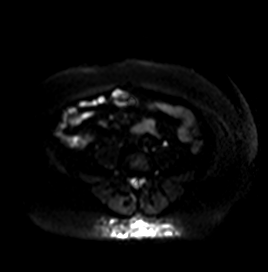
[im 13/64]
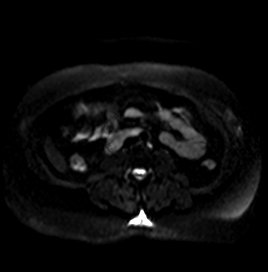
[im 26/64]
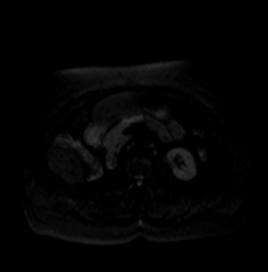
[im 38/64]
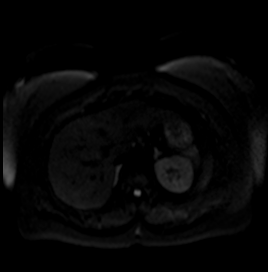
[im 51/64]
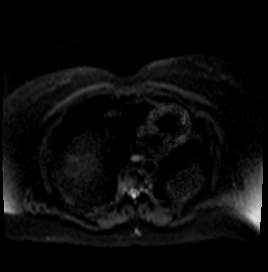
[im 64/64]
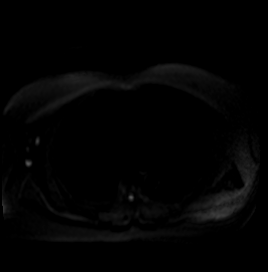

[Series 9: DWI · axial · 6.0mm · 1.36mm/px · z∈[+33,+256]mm · 3 of 32 slices shown (2 of 2)]
[im 1/32]
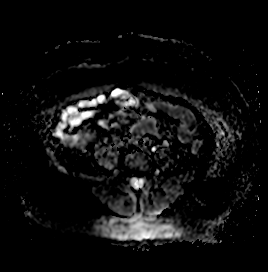
[im 16/32]
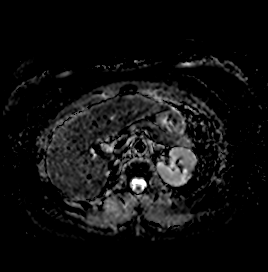
[im 32/32]
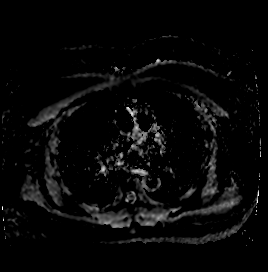

[Series 10: bSSFP · axial · 5.0mm · 0.63mm/px · z∈[-2,+212]mm · 4 of 40 slices shown]
[im 1/40]
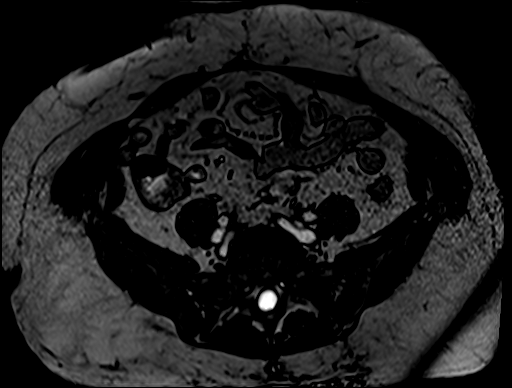
[im 14/40]
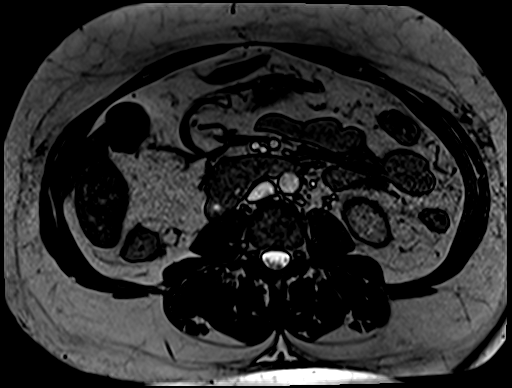
[im 27/40]
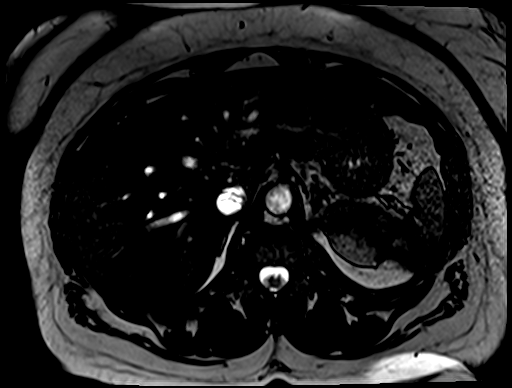
[im 40/40]
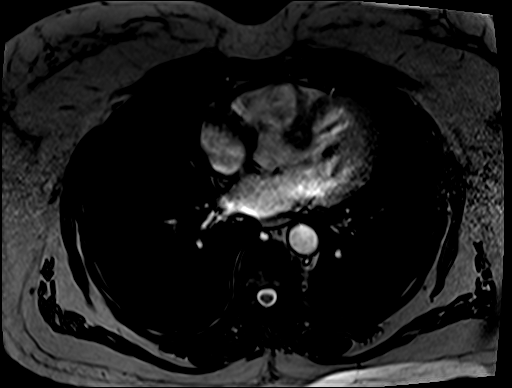

[Series 11: T1 · axial · 3.0mm · 1.02mm/px · z∈[-17,+220]mm · 8 of 80 slices shown (1 of 2)]
[im 1/80]
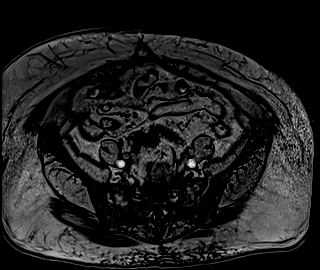
[im 12/80]
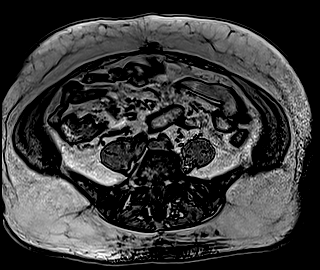
[im 23/80]
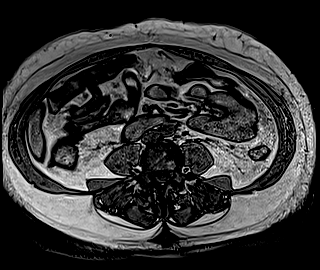
[im 34/80]
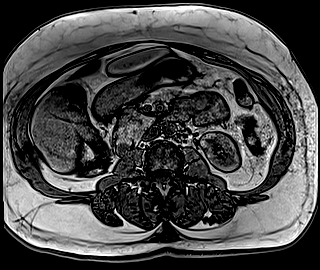
[im 46/80]
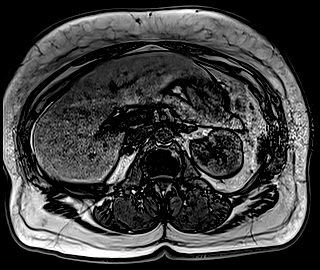
[im 57/80]
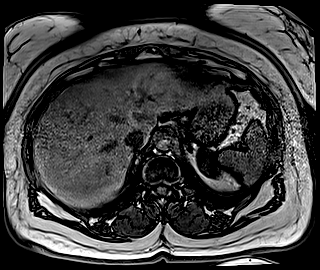
[im 68/80]
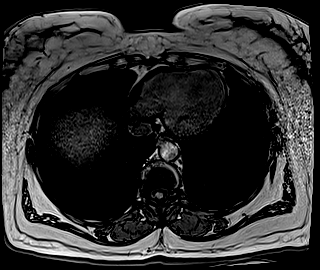
[im 80/80]
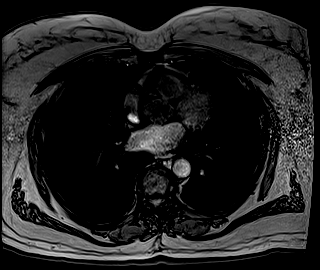

[Series 12: T1 · axial · 3.0mm · 1.02mm/px · z∈[-17,+220]mm · 8 of 80 slices shown (2 of 2)]
[im 1/80]
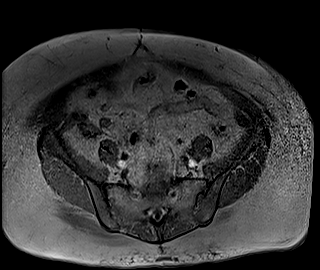
[im 12/80]
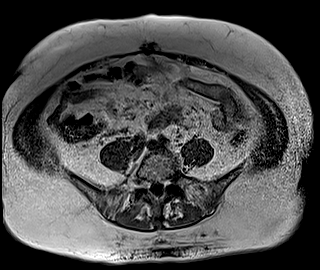
[im 23/80]
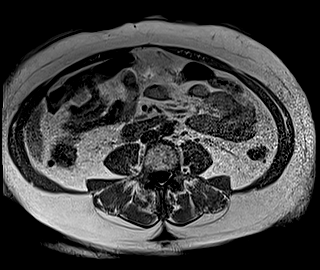
[im 34/80]
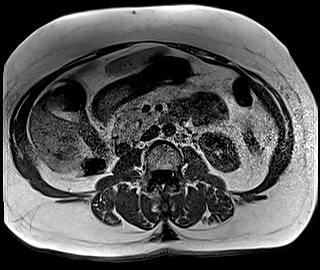
[im 46/80]
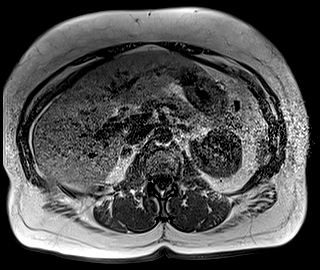
[im 57/80]
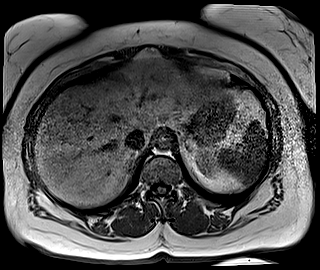
[im 68/80]
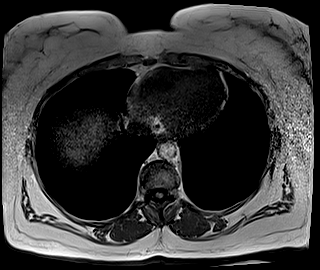
[im 80/80]
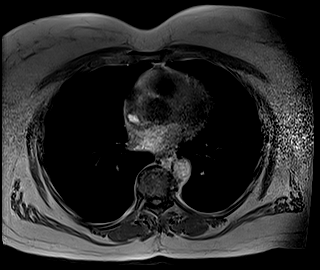

[Series 14: T1 dynamic · axial · 3.0mm · 1.02mm/px · z∈[-13,+224]mm · 8 of 80 slices shown]
[im 1/80]
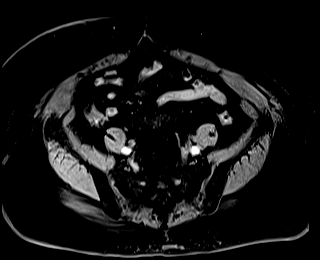
[im 12/80]
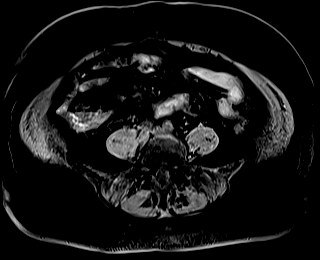
[im 23/80]
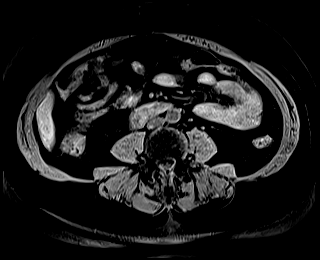
[im 34/80]
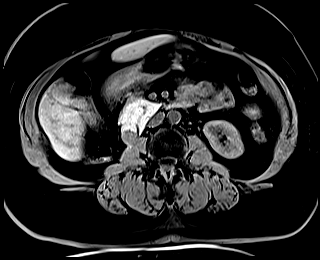
[im 46/80]
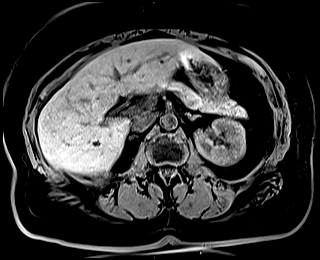
[im 57/80]
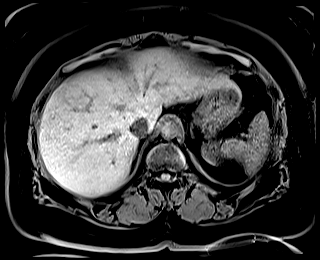
[im 68/80]
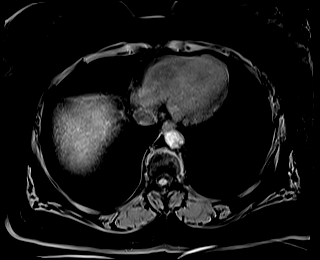
[im 80/80]
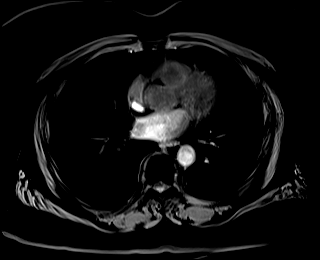

[48 of 48 positions shown; findings below may reference images not displayed]

FINDINGS: Lower chest: Unremarkable.

Hepatobiliary: No focal abnormality in the liver on this study
without intravenous contrast. There is no evidence for gallstones,
gallbladder wall thickening, or pericholecystic fluid. No
intrahepatic or extrahepatic biliary dilation.

Pancreas: No focal mass lesion. No dilatation of the main duct. No
intraparenchymal cyst. No peripancreatic edema.

Spleen:  No splenomegaly. No focal mass lesion.

Adrenals/Urinary Tract: No adrenal nodule or mass. Right kidney
surgically absent. No new suspicious soft tissue in the right
nephrectomy bed. Defect in the interpolar left kidney compatible
with partial nephrectomy. No features to suggest local recurrence.
No left hydronephrosis.

Stomach/Bowel: Stomach is unremarkable. No gastric wall thickening.
No evidence of outlet obstruction. Duodenum is normally positioned
as is the ligament of Treitz. No small bowel or colonic dilatation
within the visualized abdomen.

Vascular/Lymphatic: No abdominal aortic aneurysm There is no
gastrohepatic or hepatoduodenal ligament lymphadenopathy. No
retroperitoneal or mesenteric lymphadenopathy.

Other:  No intraperitoneal free fluid.

Musculoskeletal: No suspicious marrow signal abnormality.
IMPRESSION: 1. Stable exam. Status post right nephrectomy and partial left
nephrectomy. No evidence for local recurrence.
2. No evidence for metastatic disease in the abdomen.
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
## 2023-01-11 IMAGING — DX DG CHEST 2V
2 series · 2 of 2 positions shown · non-contrast
Comparison: Radiograph 06/22/2019

CLINICAL DATA: Kidney insert

EXAM:
CHEST - 2 VIEW

[chest pa]
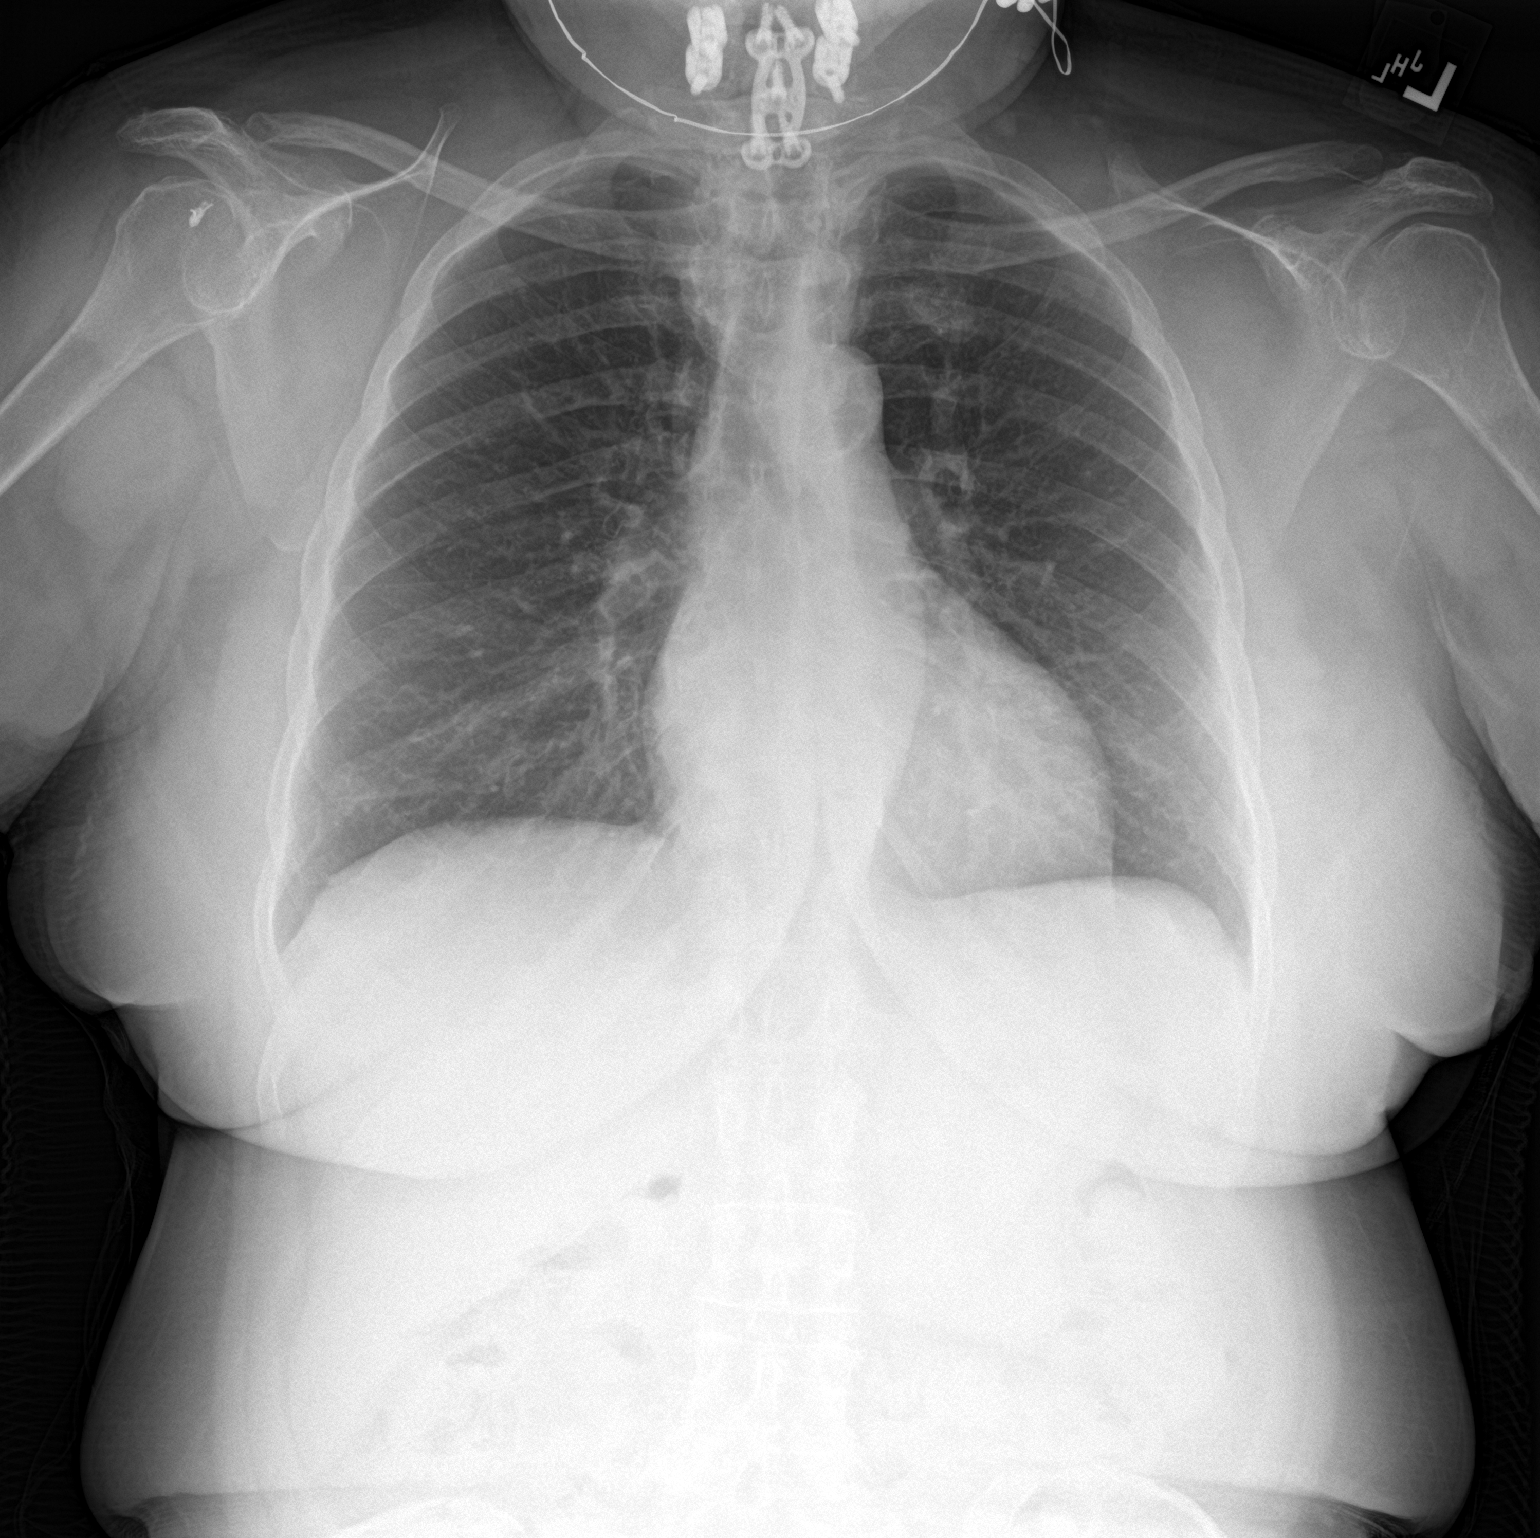

[chest lat]
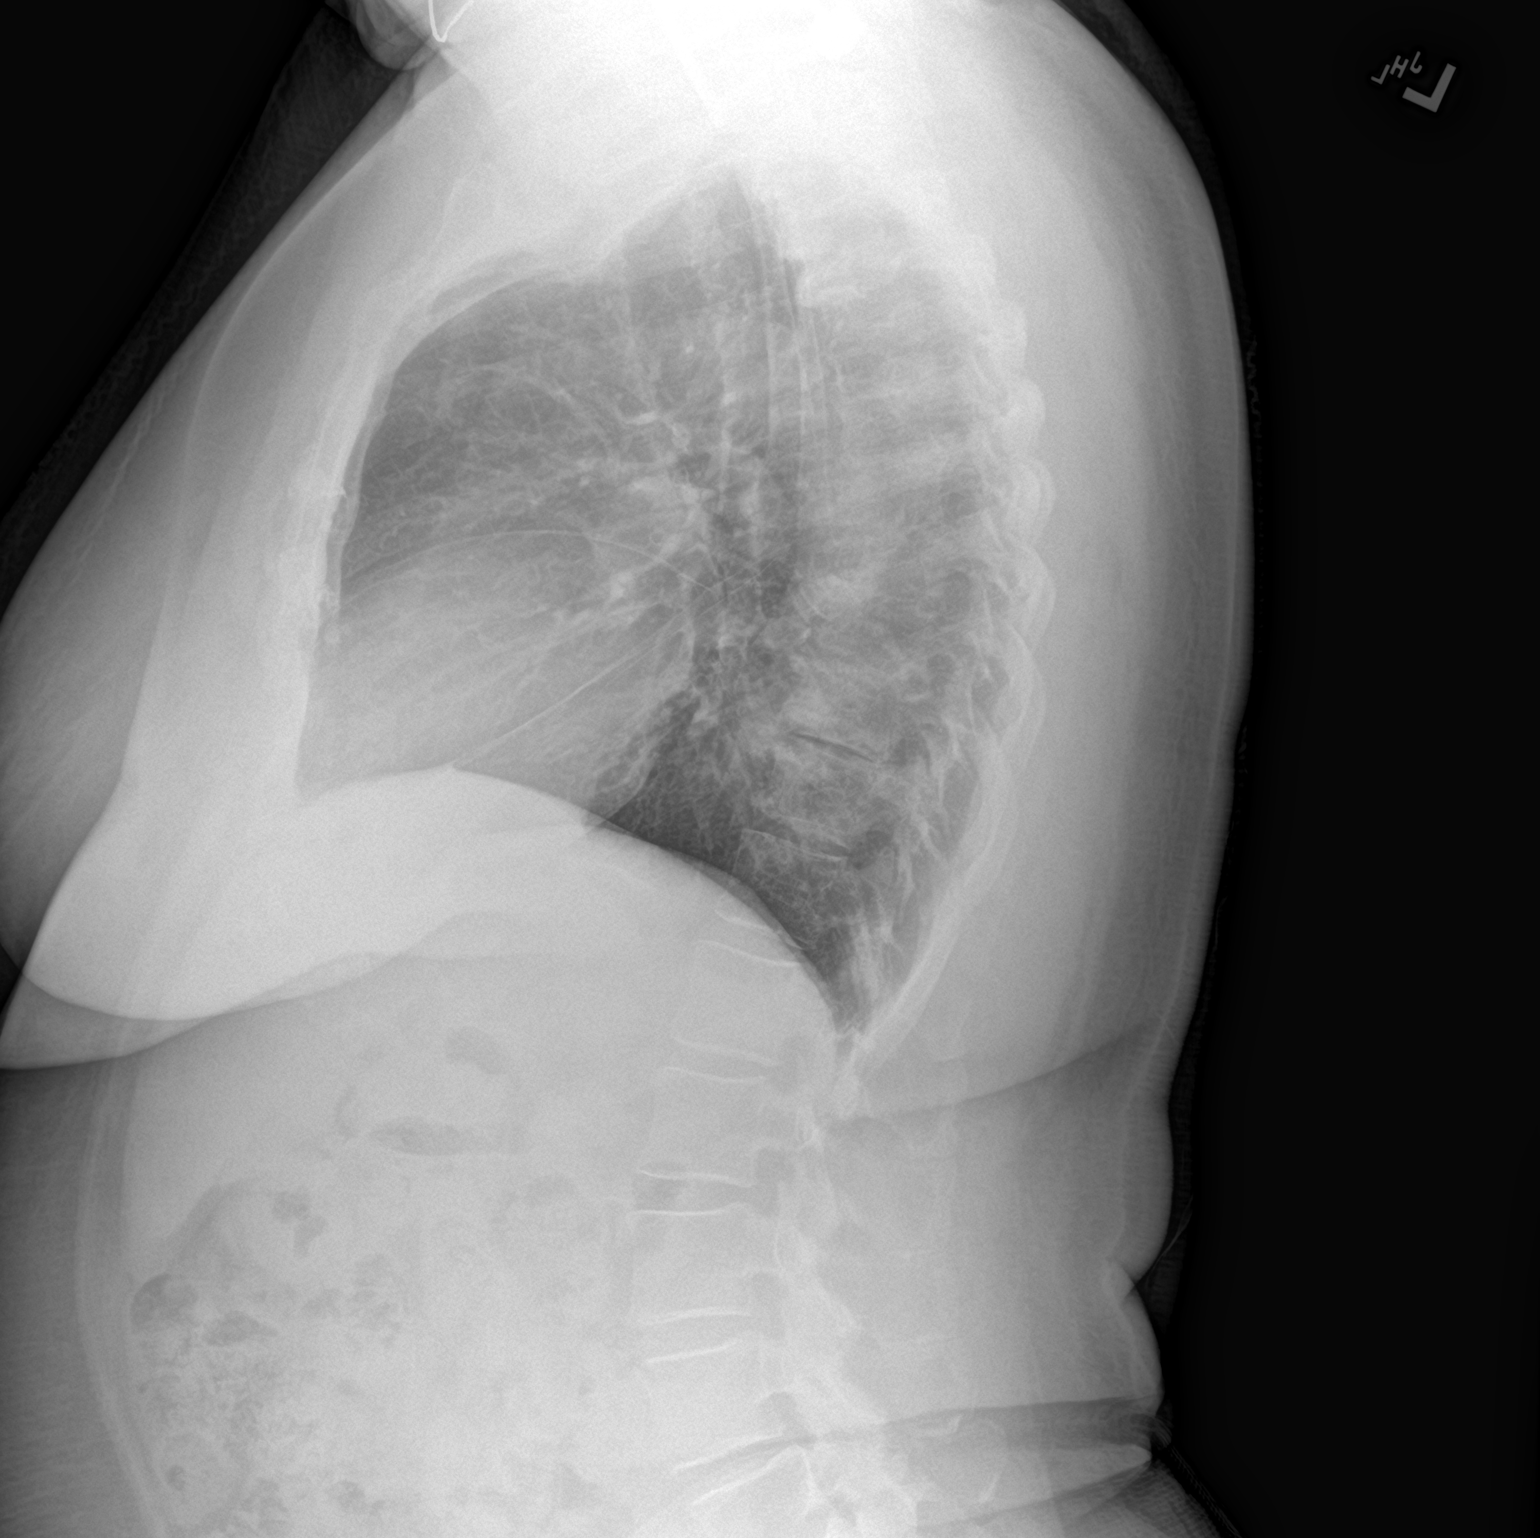

[2 of 2 positions shown; findings below may reference images not displayed]

FINDINGS: No consolidation, features of edema, pneumothorax, or effusion.
Pulmonary vascularity is normally distributed. The cardiomediastinal
contours are unremarkable. No acute osseous or soft tissue
abnormality. Prior cervical fusion and right rotator cuff repair.
IMPRESSION: No acute cardiopulmonary abnormality.

## 2023-01-13 ENCOUNTER — Ambulatory Visit: Payer: Medicare PPO | Attending: Cardiology | Admitting: Pharmacist

## 2023-01-13 ENCOUNTER — Other Ambulatory Visit (HOSPITAL_COMMUNITY): Payer: Self-pay

## 2023-01-13 ENCOUNTER — Other Ambulatory Visit: Payer: Self-pay

## 2023-01-13 VITALS — HR 65

## 2023-01-13 DIAGNOSIS — I10 Essential (primary) hypertension: Secondary | ICD-10-CM

## 2023-01-13 MED ORDER — HYDRALAZINE HCL 50 MG PO TABS
50.0000 mg | ORAL_TABLET | Freq: Two times a day (BID) | ORAL | 3 refills | Status: DC
  Filled 2023-01-13: qty 180, 90d supply, fill #0
  Filled 2023-01-14 (×2): qty 60, 30d supply, fill #0
  Filled 2023-02-06: qty 60, 30d supply, fill #1
  Filled 2023-02-08: qty 6, 3d supply, fill #2
  Filled 2023-02-21: qty 6, 3d supply, fill #3
  Filled 2023-03-07: qty 60, 30d supply, fill #3
  Filled 2023-03-07: qty 6, 3d supply, fill #3
  Filled 2023-03-28 – 2023-04-01 (×2): qty 60, 30d supply, fill #4
  Filled 2023-04-21 – 2023-04-24 (×2): qty 60, 30d supply, fill #5
  Filled 2023-05-02 – 2023-05-21 (×2): qty 60, 30d supply, fill #6
  Filled 2023-06-09 – 2023-06-17 (×2): qty 60, 30d supply, fill #7
  Filled 2023-07-04 – 2023-07-23 (×6): qty 60, 30d supply, fill #8
  Filled 2023-08-14 – 2023-08-15 (×2): qty 60, 30d supply, fill #9
  Filled 2023-09-16 – 2023-09-26 (×3): qty 60, 30d supply, fill #10
  Filled 2023-10-22 – 2023-10-27 (×2): qty 60, 30d supply, fill #11

## 2023-01-13 NOTE — Assessment & Plan Note (Signed)
Blood pressure remains slightly above goal at 142-144/60 mmHg in office Patient reports frequently missing her afternoon dose of hydralazine but good adherence to her morning and evening doses of all of her medications Adjust hydralazine dosing from 25 mg three times daily to 50 mg twice daily Continue amlodipine 10mg  daily, carvedilol 6.25mg  twice a day, furosemide 10mg  daily Patient instructed to check and record home BP readings periodically and bring them with her to her next visit in clinic Follow-up in 6 weeks in clinic

## 2023-01-13 NOTE — Patient Instructions (Addendum)
Your blood pressure was slightly above your goal today at 142-144/60 mmHg (goal of 140/90 mmHg). We will have you take your hydralazine only twice a day at morning and at night but now at a higher dose 50 mg. This adjustment will be reflected in your newest pill packs. Please try to record some BP readings at home and bring those with you to your next visit.  For your insulin detemir [name]) reduce the dose from 64 units to 60 units daily. Continue to check your blood sugars each morning and evening and bring those readings into your follow-up visit.  Thank you for coming in to see [place] today! It is always a pleasure to see you!  Wilmer Floor, PharmD PGY2 Cardiology Pharmacy Resident

## 2023-01-14 ENCOUNTER — Other Ambulatory Visit (HOSPITAL_COMMUNITY): Payer: Self-pay

## 2023-01-14 ENCOUNTER — Other Ambulatory Visit: Payer: Self-pay

## 2023-01-20 ENCOUNTER — Other Ambulatory Visit: Payer: Self-pay

## 2023-01-20 ENCOUNTER — Telehealth: Payer: Self-pay | Admitting: Pharmacist

## 2023-01-20 ENCOUNTER — Other Ambulatory Visit (HOSPITAL_COMMUNITY): Payer: Self-pay

## 2023-01-20 NOTE — Telephone Encounter (Signed)
Unsure how metoprolol and Bidil got back on patients medication list. I wonder if it was transferred from [facility]. Patient is not supposed to be on. Also does not look like the increase hydralazine dose was added to pill pack. I have asked her to bring her pill packs by so I can fix them.

## 2023-01-20 NOTE — Telephone Encounter (Signed)
Sounds like daughter insisted that full med profile from [facility] be transferred which is how the Bidil and metoprolol was transferred. When meds were refilled for this round of med pouches they were filled. Patient came in with 1 week of med box AM and PM and her box of pre-packs plus he bottle of hydralazine 50mg . I made up her pill box with what medications she should be taking and removed the ones she should not. Explained what dicyclomine is used for. It is rx by PCP. I gave her the pill pouches to take if she wanted. Does not sound like she has been taking. She will come back Friday for me to finish filling boxes. I will go get more from pharmacy.

## 2023-01-20 NOTE — Telephone Encounter (Signed)
Pt c/o medication issue:  1. Name of Medication:  amLODipine (NORVASC) 10 MG tablet  carvedilol (COREG) 6.25 MG tablet  atorvastatin (LIPITOR) 10 MG tablet   dapagliflozin propanediol (FARXIGA) 10 MG TABS tablet    hydrALAZINE (APRESOLINE) 50 MG tablet   2. How are you currently taking this medication (dosage and times per day)? As Written  3. Are you having a reaction (difficulty breathing--STAT)? No  4. What is your medication issue? Patient is requesting to speak with the Pharmacist Melissa in regards to how she should take these medications. Please advise.

## 2023-01-21 ENCOUNTER — Other Ambulatory Visit: Payer: Self-pay

## 2023-01-23 NOTE — Addendum Note (Signed)
Addended by: Malena Peer D on: 01/23/2023 12:21 PM   Modules accepted: Orders

## 2023-01-24 ENCOUNTER — Other Ambulatory Visit (HOSPITAL_COMMUNITY): Payer: Self-pay

## 2023-01-24 NOTE — Telephone Encounter (Signed)
Fix patient's pillbox through 02/06/2023.  Confirmed with [facility] pharmacy that her medication showed refill on 12/14 and would be delivered to her on 12/18. She needs a refill of the donepezil.  Was a long center request of PCP but it was denied.  I asked patient to call his office for refill and ask it to be sent to [facility] pharmacy so that it can be included in her med box.  Medications that are missing from the med box I made up are Tylenol aspirin and donepezil.  Patient states she will add these herself.

## 2023-01-27 ENCOUNTER — Other Ambulatory Visit (HOSPITAL_COMMUNITY): Payer: Self-pay

## 2023-01-31 NOTE — Progress Notes (Signed)
I have seen patient alone with [name] and agree with note

## 2023-02-04 ENCOUNTER — Other Ambulatory Visit: Payer: Self-pay

## 2023-02-06 ENCOUNTER — Other Ambulatory Visit: Payer: Self-pay

## 2023-02-07 ENCOUNTER — Other Ambulatory Visit: Payer: Self-pay

## 2023-02-07 ENCOUNTER — Other Ambulatory Visit (HOSPITAL_COMMUNITY): Payer: Self-pay

## 2023-02-07 NOTE — Telephone Encounter (Signed)
Patient called looking for Melissa. States she does not have any medications and [facility] suppose to send her pill pack but she did not received it. Reached out to Sherlynn Stalls, [facility]. She had called patient and resolved the pill pack delivery issue.   I call back patient at 4:15 pm to see patient does not have any other concern. Patient said everything is good now and she received her pill.

## 2023-02-08 ENCOUNTER — Other Ambulatory Visit: Payer: Self-pay

## 2023-02-08 ENCOUNTER — Other Ambulatory Visit (HOSPITAL_COMMUNITY): Payer: Self-pay

## 2023-02-11 ENCOUNTER — Other Ambulatory Visit (HOSPITAL_COMMUNITY): Payer: Self-pay

## 2023-02-11 ENCOUNTER — Other Ambulatory Visit: Payer: Self-pay

## 2023-02-11 MED ORDER — GABAPENTIN 300 MG PO CAPS
ORAL_CAPSULE | ORAL | 1 refills | Status: DC
  Filled 2023-02-11 (×2): qty 90, 30d supply, fill #0
  Filled 2023-02-21 – 2023-03-07 (×3): qty 90, 30d supply, fill #1
  Filled 2023-03-28 – 2023-04-01 (×2): qty 90, 30d supply, fill #2
  Filled 2023-04-21 – 2023-04-24 (×2): qty 90, 30d supply, fill #3
  Filled 2023-05-02 – 2023-05-21 (×2): qty 90, 30d supply, fill #4
  Filled 2023-06-09 – 2023-06-17 (×2): qty 90, 30d supply, fill #5

## 2023-02-11 MED ORDER — GABAPENTIN 300 MG PO CAPS
ORAL_CAPSULE | ORAL | 1 refills | Status: DC
  Filled 2023-02-11: qty 270, 90d supply, fill #0
  Filled 2023-03-07: qty 90, 30d supply, fill #0
  Filled 2023-07-04: qty 270, 90d supply, fill #0
  Filled 2023-07-10 – 2023-07-23 (×4): qty 90, 30d supply, fill #0
  Filled 2023-08-14 – 2023-08-15 (×2): qty 90, 30d supply, fill #1
  Filled 2023-09-16 – 2023-09-26 (×3): qty 90, 30d supply, fill #2

## 2023-02-11 MED ORDER — DONEPEZIL HCL 10 MG PO TABS
10.0000 mg | ORAL_TABLET | Freq: Every day | ORAL | 1 refills | Status: DC
  Filled 2023-02-11: qty 30, 30d supply, fill #0
  Filled 2023-02-21 – 2023-03-07 (×3): qty 30, 30d supply, fill #1
  Filled 2023-03-28 – 2023-04-01 (×2): qty 30, 30d supply, fill #2
  Filled 2023-04-21 – 2023-04-24 (×2): qty 30, 30d supply, fill #3
  Filled 2023-05-02 – 2023-05-21 (×2): qty 30, 30d supply, fill #4
  Filled 2023-06-09 – 2023-06-17 (×2): qty 30, 30d supply, fill #5

## 2023-02-18 ENCOUNTER — Other Ambulatory Visit: Payer: Self-pay

## 2023-02-18 ENCOUNTER — Other Ambulatory Visit (HOSPITAL_COMMUNITY): Payer: Self-pay

## 2023-02-18 MED ORDER — TRESIBA FLEXTOUCH 200 UNIT/ML ~~LOC~~ SOPN
70.0000 [IU] | PEN_INJECTOR | Freq: Every day | SUBCUTANEOUS | 6 refills | Status: DC
  Filled 2023-02-18: qty 12, 35d supply, fill #0
  Filled 2023-04-14: qty 12, 35d supply, fill #1
  Filled 2023-05-27: qty 9, 26d supply, fill #2
  Filled 2023-06-09 – 2023-06-17 (×2): qty 9, 26d supply, fill #3
  Filled 2023-07-04 – 2023-07-15 (×4): qty 9, 26d supply, fill #4
  Filled 2023-08-14: qty 9, 26d supply, fill #5
  Filled 2023-09-05: qty 9, 26d supply, fill #6

## 2023-02-20 ENCOUNTER — Ambulatory Visit: Payer: Medicare PPO | Attending: Family Medicine
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
## 2023-02-21 ENCOUNTER — Other Ambulatory Visit: Payer: Self-pay

## 2023-02-22 ENCOUNTER — Other Ambulatory Visit (HOSPITAL_COMMUNITY): Payer: Self-pay

## 2023-02-24 DIAGNOSIS — E1165 Type 2 diabetes mellitus with hyperglycemia: Secondary | ICD-10-CM | POA: Diagnosis not present

## 2023-02-26 ENCOUNTER — Other Ambulatory Visit: Payer: Self-pay

## 2023-03-03 ENCOUNTER — Other Ambulatory Visit (HOSPITAL_COMMUNITY): Payer: Self-pay

## 2023-03-03 DIAGNOSIS — I10 Essential (primary) hypertension: Secondary | ICD-10-CM | POA: Diagnosis not present

## 2023-03-03 DIAGNOSIS — E1165 Type 2 diabetes mellitus with hyperglycemia: Secondary | ICD-10-CM | POA: Diagnosis not present

## 2023-03-03 DIAGNOSIS — E785 Hyperlipidemia, unspecified: Secondary | ICD-10-CM | POA: Diagnosis not present

## 2023-03-03 MED ORDER — NOVOLOG FLEXPEN 100 UNIT/ML ~~LOC~~ SOPN
1.0000 [IU] | PEN_INJECTOR | Freq: Two times a day (BID) | SUBCUTANEOUS | 3 refills | Status: DC
  Filled 2023-03-03: qty 3, 28d supply, fill #0
  Filled 2023-07-01: qty 3, 28d supply, fill #1
  Filled 2023-08-18: qty 3, 28d supply, fill #2
  Filled 2023-08-26: qty 3, 28d supply, fill #3

## 2023-03-04 ENCOUNTER — Telehealth: Payer: Self-pay | Admitting: Pharmacist

## 2023-03-04 DIAGNOSIS — M542 Cervicalgia: Secondary | ICD-10-CM | POA: Diagnosis not present

## 2023-03-04 DIAGNOSIS — R1012 Left upper quadrant pain: Secondary | ICD-10-CM | POA: Diagnosis not present

## 2023-03-04 DIAGNOSIS — M79652 Pain in left thigh: Secondary | ICD-10-CM | POA: Diagnosis not present

## 2023-03-04 NOTE — Telephone Encounter (Signed)
 Patient missed appointment with me the other week. Called pt to make sure she was alright and reschedule. Mail box was full

## 2023-03-05 ENCOUNTER — Other Ambulatory Visit: Payer: Self-pay

## 2023-03-07 ENCOUNTER — Other Ambulatory Visit: Payer: Self-pay

## 2023-03-07 DIAGNOSIS — E1169 Type 2 diabetes mellitus with other specified complication: Secondary | ICD-10-CM | POA: Diagnosis not present

## 2023-03-11 ENCOUNTER — Other Ambulatory Visit (HOSPITAL_COMMUNITY): Payer: Self-pay

## 2023-03-12 ENCOUNTER — Other Ambulatory Visit: Payer: Self-pay

## 2023-03-14 ENCOUNTER — Other Ambulatory Visit: Payer: Self-pay

## 2023-03-14 ENCOUNTER — Other Ambulatory Visit (HOSPITAL_COMMUNITY): Payer: Self-pay

## 2023-03-14 MED ORDER — DEXCOM G7 SENSOR MISC
5 refills | Status: DC
  Filled 2023-03-14: qty 2, 20d supply, fill #0
  Filled 2023-03-15: qty 2, 28d supply, fill #0
  Filled 2023-03-15: qty 3, 30d supply, fill #0
  Filled 2023-04-14: qty 3, 30d supply, fill #1
  Filled 2023-05-06 – 2023-05-07 (×2): qty 3, 30d supply, fill #2
  Filled 2023-06-20 – 2023-06-23 (×2): qty 3, 30d supply, fill #3
  Filled 2023-07-04 – 2023-07-16 (×3): qty 3, 30d supply, fill #4
  Filled 2023-08-19 (×2): qty 3, 30d supply, fill #5

## 2023-03-15 ENCOUNTER — Other Ambulatory Visit (HOSPITAL_COMMUNITY): Payer: Self-pay

## 2023-03-15 ENCOUNTER — Other Ambulatory Visit (HOSPITAL_BASED_OUTPATIENT_CLINIC_OR_DEPARTMENT_OTHER): Payer: Self-pay

## 2023-03-16 IMAGING — MR MR LUMBAR SPINE W/O CM
6 series · 36 of 48 positions shown · non-contrast
Comparison: MRI thoracic spine 03/26/2018.  Lumbar MRI 03/26/2018

CLINICAL DATA: Low back pain.  Infection suspected.

EXAM:
MRI LUMBAR SPINE WITHOUT CONTRAST
TECHNIQUE: Multiplanar, multisequence MR imaging of the lumbar spine was
performed. No intravenous contrast was administered.

[Series 5: T1 · sagittal · 4.0mm · 0.81mm/px · 4 of 16 slices shown (1 of 2)]
[im 1/16]
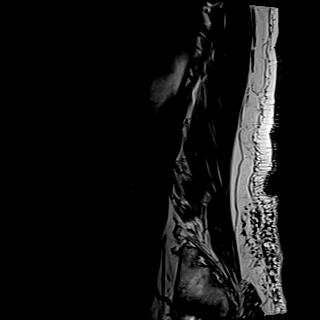
[im 6/16]
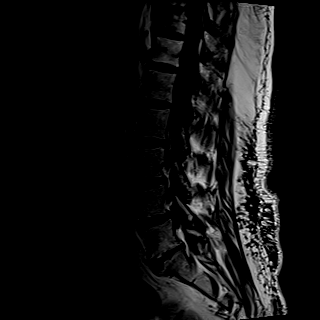
[im 11/16]
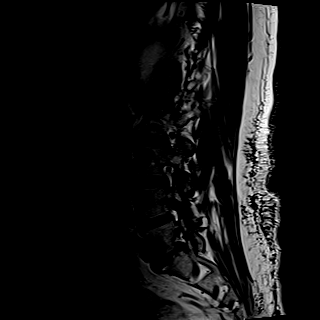
[im 16/16]
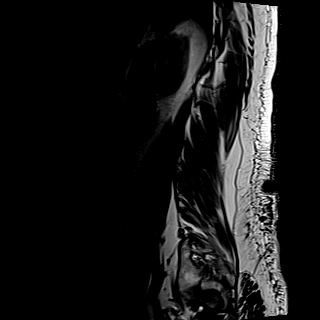

[Series 6: T2 · sagittal · 4.0mm · 0.81mm/px · 4 of 16 slices shown (1 of 3)]
[im 1/16]
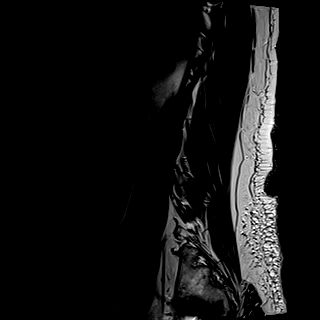
[im 6/16]
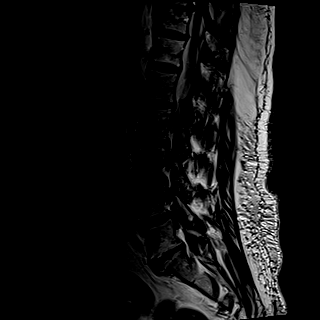
[im 11/16]
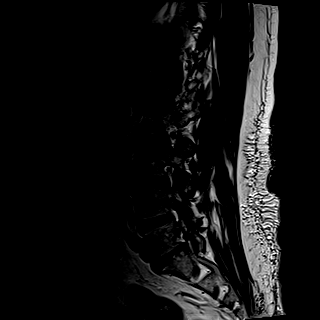
[im 16/16]
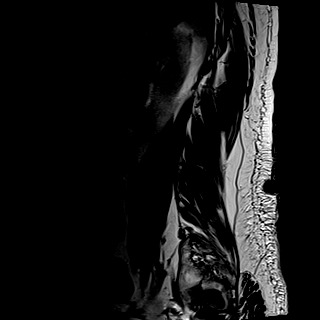

[Series 7: STIR · sagittal · 4.0mm · 0.51mm/px · 2 of 16 slices shown]
[im 1/16]
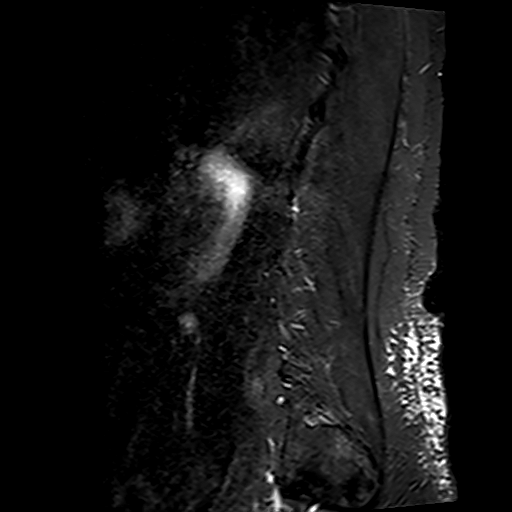
[im 6/16]
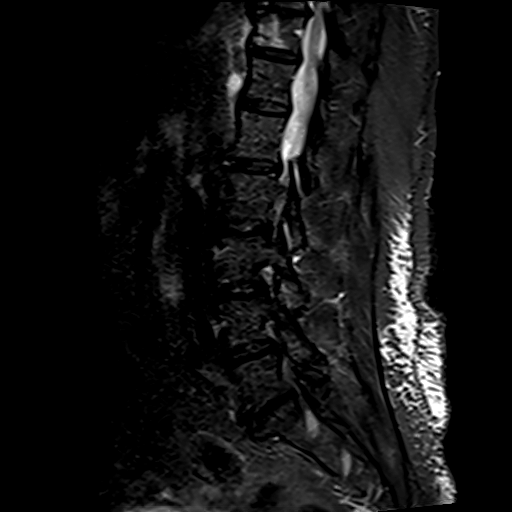

[Series 8: T2 · axial · 4.0mm · 0.78mm/px · z∈[+42,+295]mm · 9 of 46 slices shown (2 of 3)]
[im 1/46]
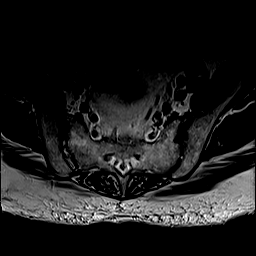
[im 9/46]
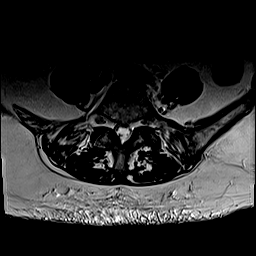
[im 13/46]
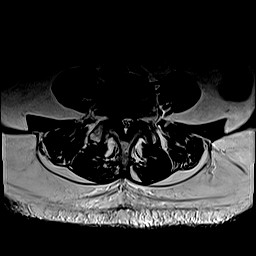
[im 21/46]
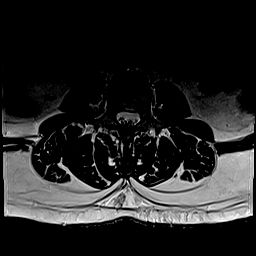
[im 25/46]
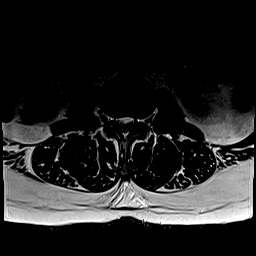
[im 33/46]
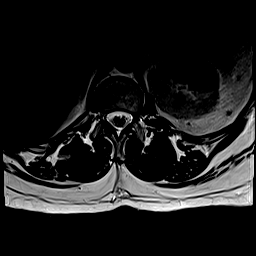
[im 37/46]
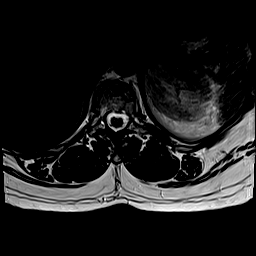
[im 41/46]
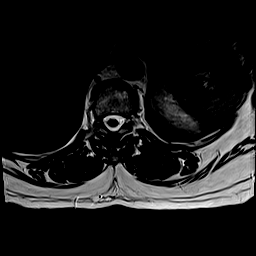
[im 46/46]
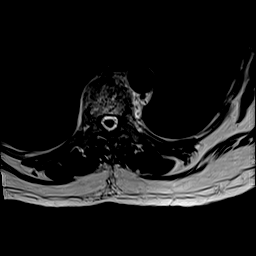

[Series 9: T1 · axial · 4.0mm · 0.39mm/px · z∈[+42,+295]mm · 8 of 46 slices shown (2 of 2)]
[im 1/46]
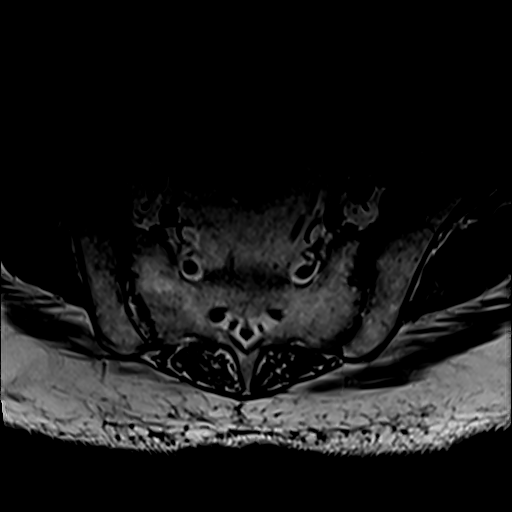
[im 9/46]
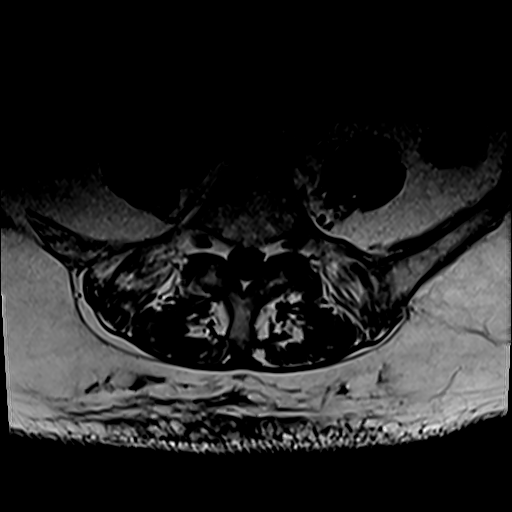
[im 13/46]
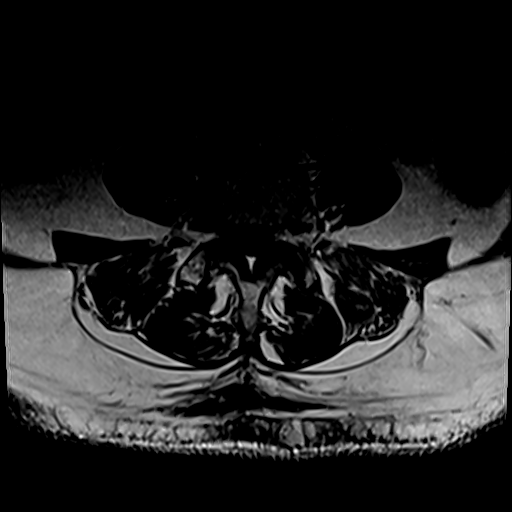
[im 21/46]
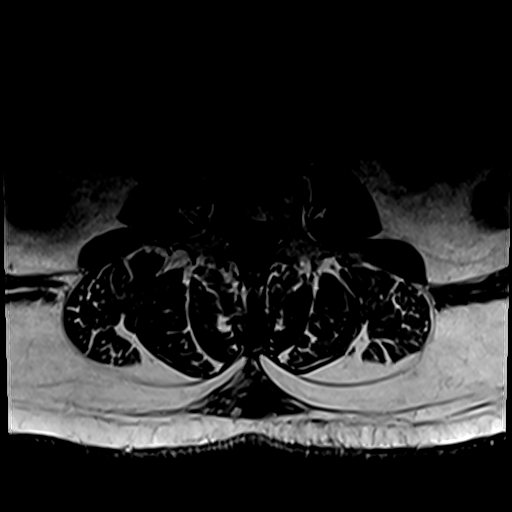
[im 25/46]
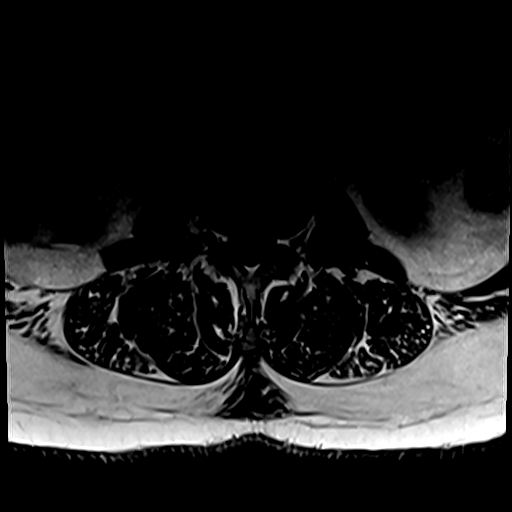
[im 33/46]
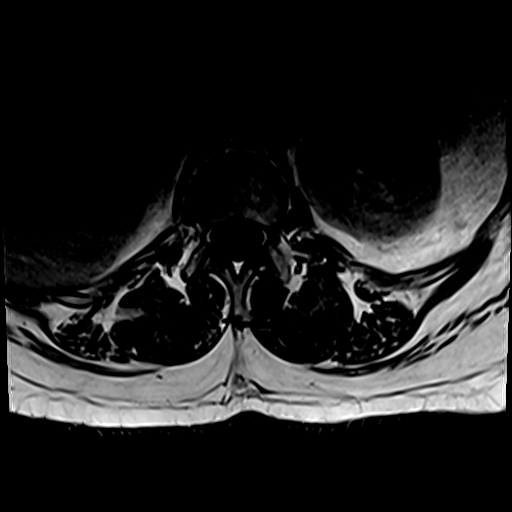
[im 37/46]
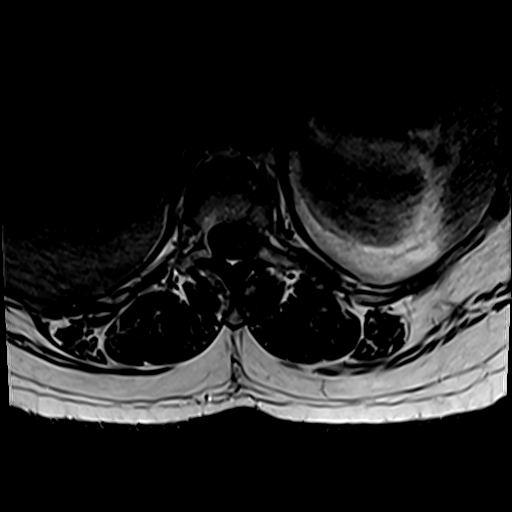
[im 46/46]
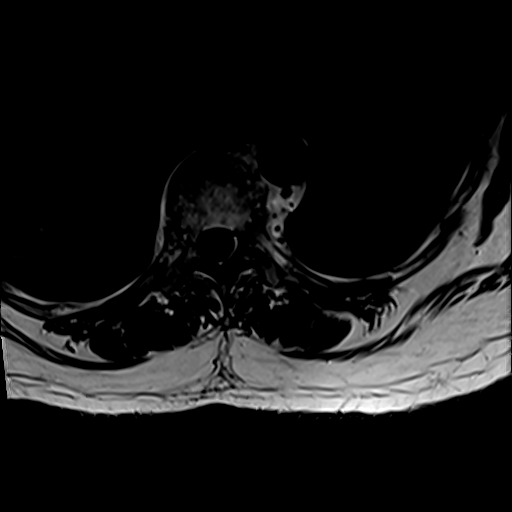

[Series 10: T2 · axial · 4.0mm · 1.04mm/px · z∈[+42,+295]mm · 9 of 46 slices shown (3 of 3)]
[im 1/46]
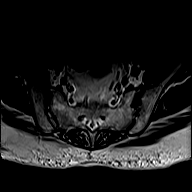
[im 9/46]
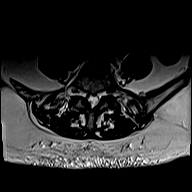
[im 13/46]
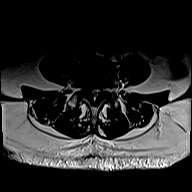
[im 21/46]
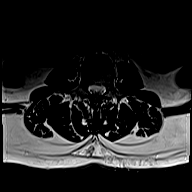
[im 25/46]
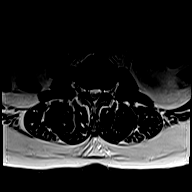
[im 33/46]
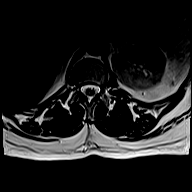
[im 37/46]
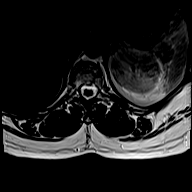
[im 41/46]
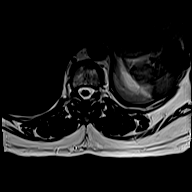
[im 46/46]
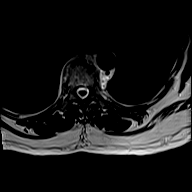

[36 of 48 positions shown; findings below may reference images not displayed]

FINDINGS: Segmentation:  Normal

Alignment:  Slight anterolisthesis C4-5 otherwise normal alignment

Vertebrae: Patchy bone marrow edema T11 vertebral body diffusely.
This could be due to fracture or mass or degenerative change. Bone
marrow edema not typical in location and appearance for infection.
No significant vertebral body loss of height identified. No lumbar
fracture or mass.

Conus medullaris and cauda equina: Conus extends to the L1-2 level.
Conus and cauda equina appear normal.

Paraspinal and other soft tissues: Negative for paraspinous mass or
adenopathy. No paraspinous edema.

Disc levels:

T10-11: Disc degeneration with disc space narrowing. Diffuse
endplate spurring and broad-based disc protrusion. Bilateral facet
hypertrophy. Severe spinal stenosis with cord compression and
probable cord hyperintensity. This shows significant progression
from the prior thoracic MRI of 03/26/2018

T11-12: Mild disc and facet degeneration. Mild foraminal narrowing
bilaterally

T12-L1: Small left paracentral disc protrusion and mild facet
degeneration. Negative for stenosis

L1-2: Diffuse bulging of the disc and bilateral facet hypertrophy.
No significant stenosis.

L2-3: Mild disc degeneration. Bilateral facet degeneration. Mild
foraminal narrowing bilaterally

L3-4: Diffuse bulging of the disc and bilateral facet degeneration.
Mild subarticular stenosis bilaterally

L4-5: Disc degeneration with disc bulging and moderate to advanced
facet degeneration bilaterally. Mild to moderate spinal stenosis.
Moderate subarticular stenosis bilaterally right greater than left.

L5-S1: Disc degeneration with diffuse endplate spurring. Moderate
left subarticular stenosis.
IMPRESSION: 1. Patchy bone marrow edema T11 vertebral body without significant
loss of height. Differential diagnosis includes mild fracture, edema
related to disc degeneration, and tumor. Infection not considered
likely. Recommend follow-up MRI of the thoracic spine without with
contrast
2. Severe spinal stenosis with cord compression and cord
hyperintensity at T10-11. Attention at follow-up MRI thoracic spine
without with contrast.
3. Lumbar spine degenerative changes most prominent L4-5. Mild to
moderate spinal stenosis L4-5 with moderate subarticular stenosis
bilaterally right greater than left.
4. These results were called by telephone at the time of
interpretation on 05/04/2020 at [DATE] to provider LUSINE JIM ,
who verbally acknowledged these results.

## 2023-03-16 IMAGING — MR MR THORACIC SPINE WO/W CM
8 of 10 series · 32 of 48 positions shown · IV contrast (gadavist)
Comparison: MRI lumbar spine May 04, 2020.

CLINICAL DATA: Mid back pain.  Abnormal MRI lumbar spine

EXAM:
MRI THORACIC WITHOUT AND WITH CONTRAST
TECHNIQUE: Multiplanar and multiecho pulse sequences of the thoracic spine were
obtained without and with intravenous contrast.
CONTRAST:  6mL GADAVIST GADOBUTROL 1 MMOL/ML IV SOLN

[Series 16: T1 · sagittal · 4.0mm · 1.72mm/px · 1 of 5 slices shown (1 of 3)]
[im 1/5]
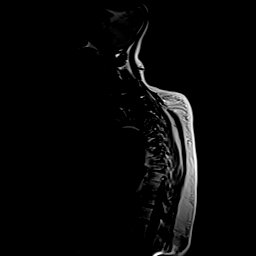

[Series 17: STIR · sagittal · 3.0mm · 0.97mm/px · 3 of 15 slices shown]
[im 1/15]
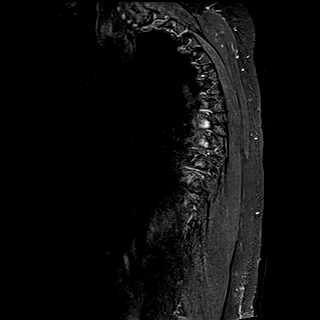
[im 8/15]
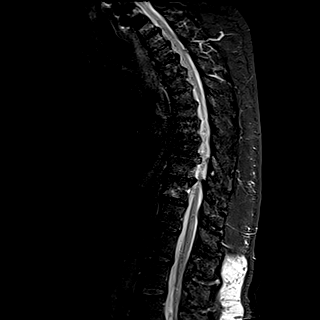
[im 15/15]
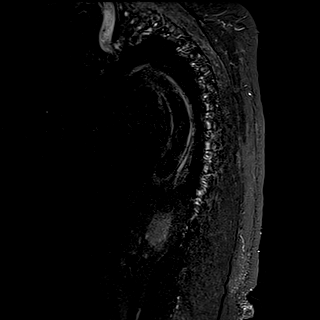

[Series 18: T1 · sagittal · 3.0mm · 0.97mm/px · 3 of 15 slices shown (2 of 3)]
[im 1/15]
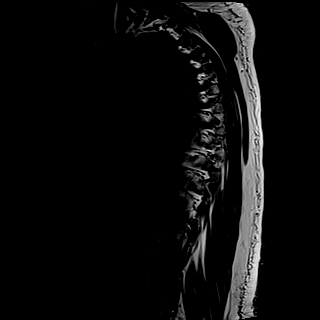
[im 8/15]
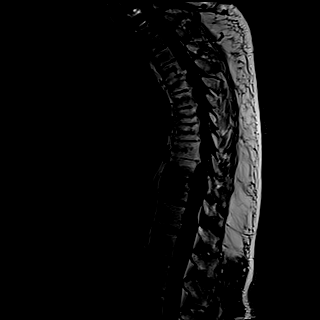
[im 15/15]
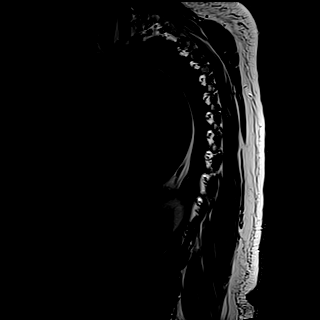

[Series 19: T2 · axial · 4.0mm · 0.78mm/px · z∈[-204,-66]mm · 8 of 43 slices shown]
[im 1/43]
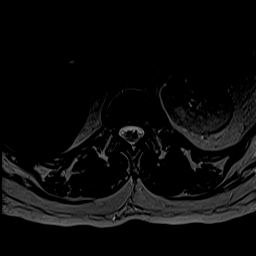
[im 7/43]
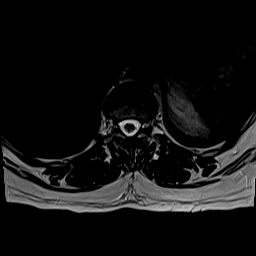
[im 13/43]
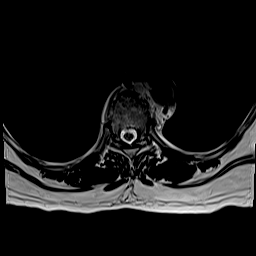
[im 19/43]
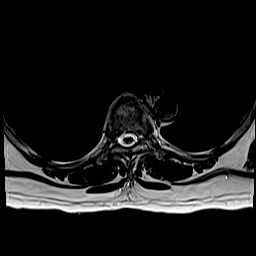
[im 25/43]
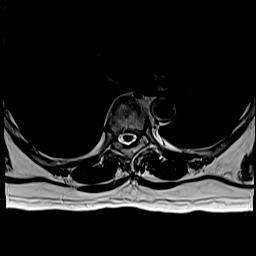
[im 31/43]
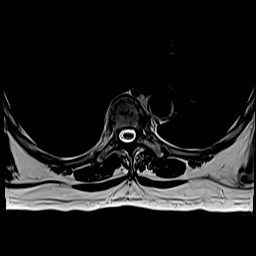
[im 37/43]
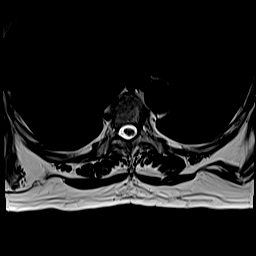
[im 43/43]
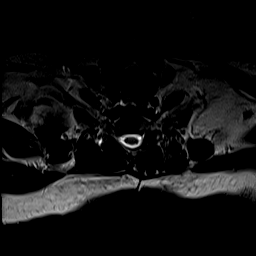

[Series 21: T1 · axial · 4.0mm · 0.39mm/px · z∈[-204,-66]mm · 8 of 43 slices shown (3 of 3)]
[im 1/43]
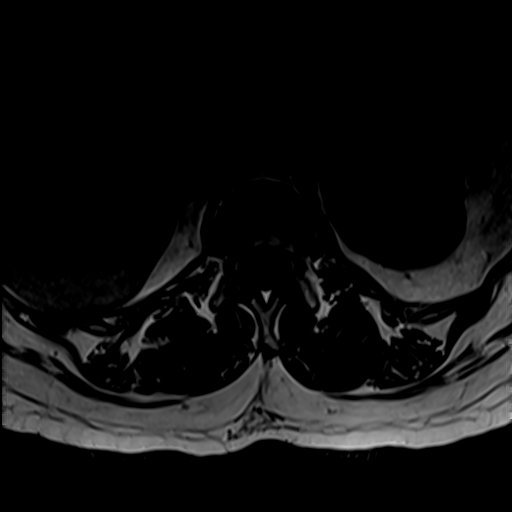
[im 7/43]
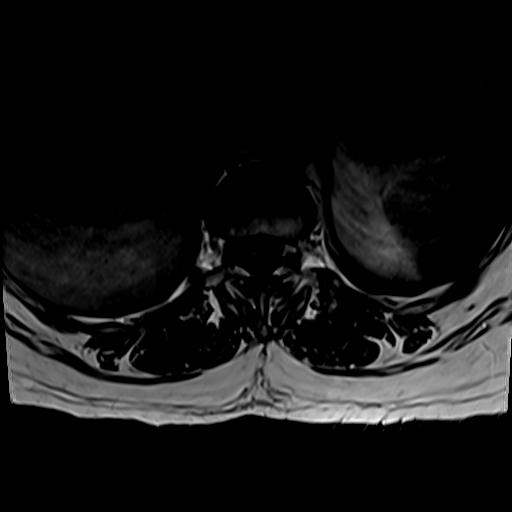
[im 13/43]
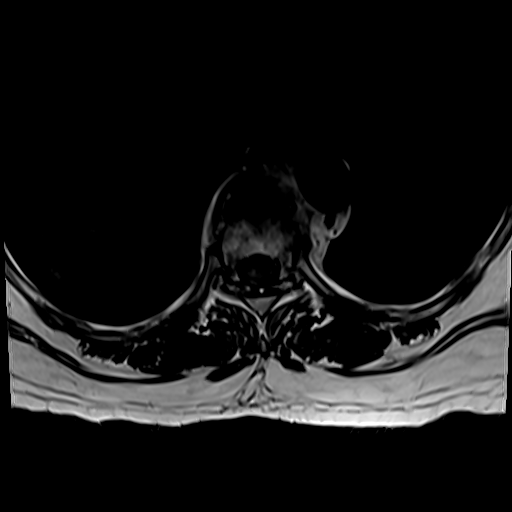
[im 19/43]
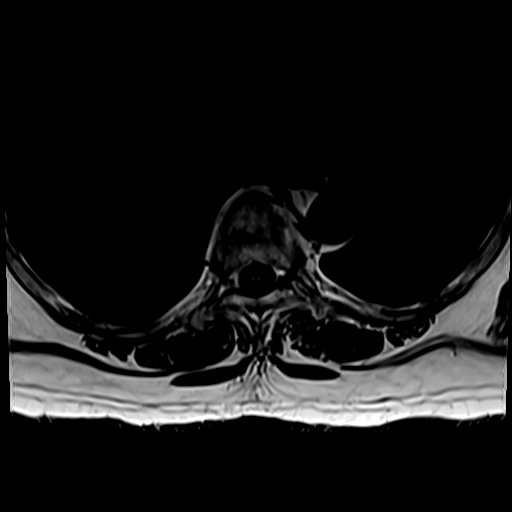
[im 25/43]
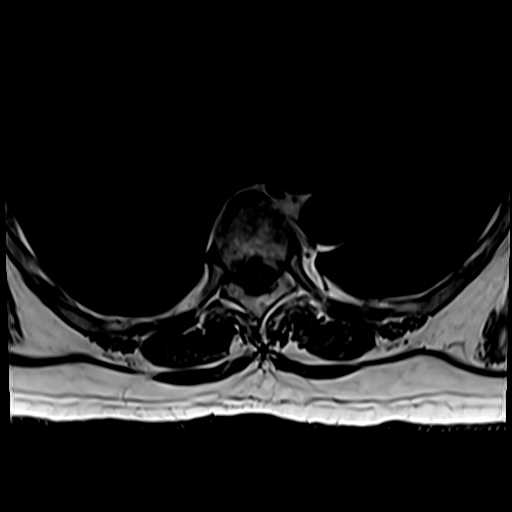
[im 31/43]
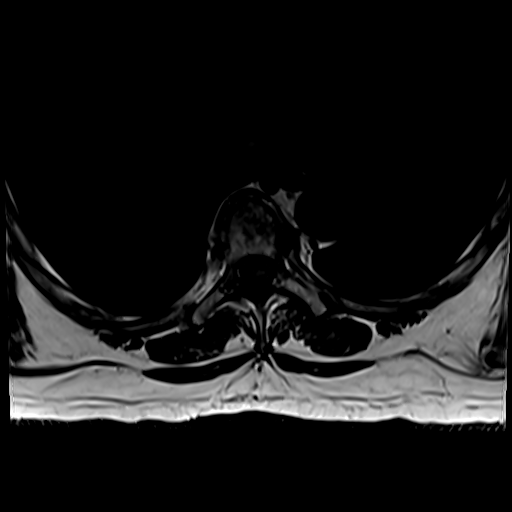
[im 37/43]
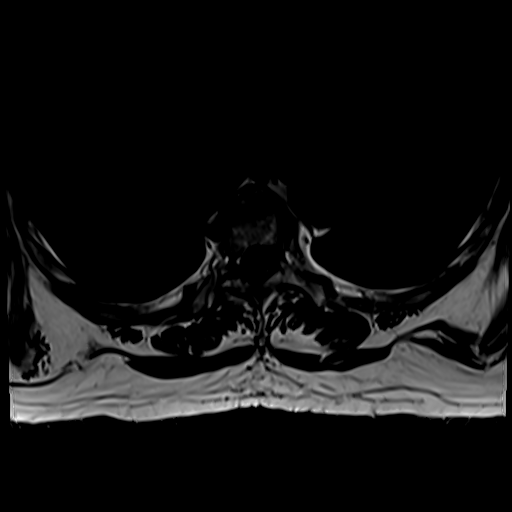
[im 43/43]
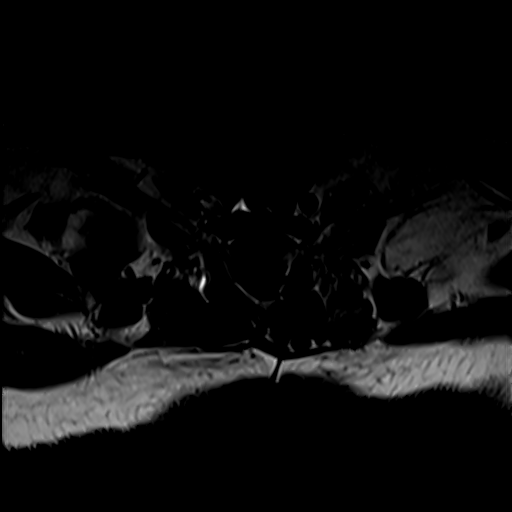

[Series 22: T2 post-contrast · sagittal · 3.0mm · 0.81mm/px · 3 of 15 slices shown]
[im 1/15]
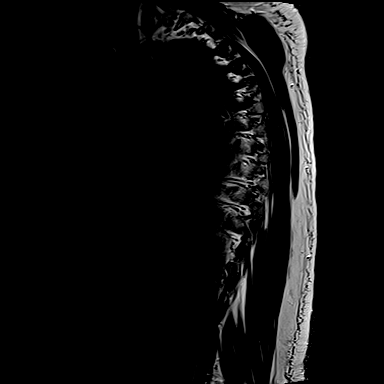
[im 8/15]
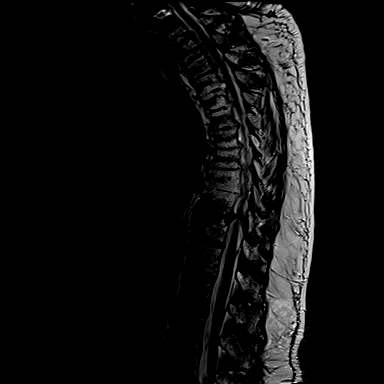
[im 15/15]
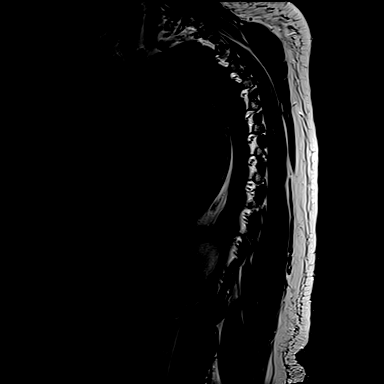

[Series 23: T1 fat-sat post-contrast · sagittal · 3.0mm · 0.97mm/px · 3 of 15 slices shown (1 of 2)]
[im 1/15]
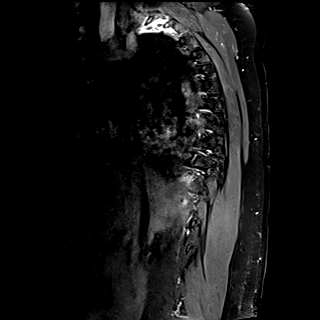
[im 8/15]
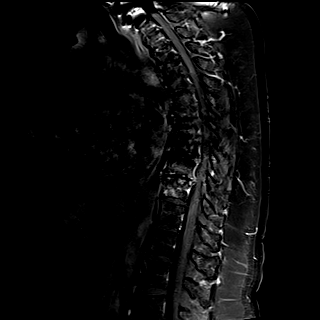
[im 15/15]
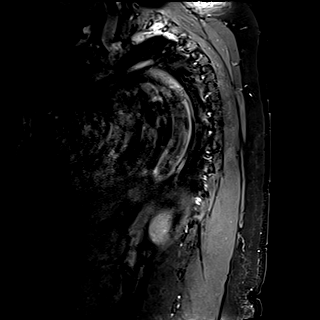

[Series 25: T1 fat-sat post-contrast · sagittal · 3.0mm · 0.97mm/px · 3 of 15 slices shown (2 of 2)]
[im 1/15]
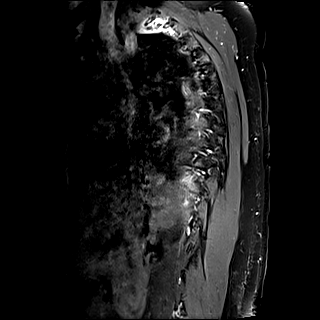
[im 8/15]
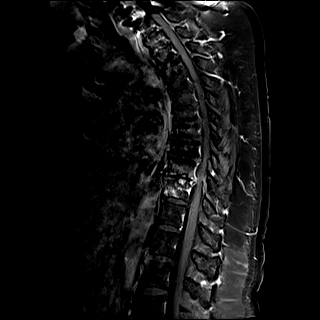
[im 15/15]
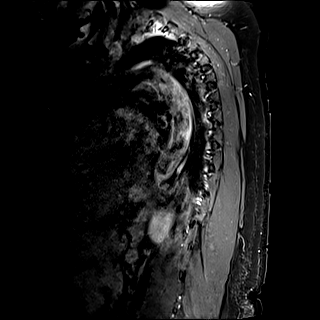

[32 of 48 positions shown; findings below may reference images not displayed]

FINDINGS: Alignment: Mildly exaggerated thoracic kyphosis. No substantial
sagittal subluxation.

Vertebrae: Multilevel degenerative endplate signal changes. As noted
on same day MRI lumbar spine, there is patchy bone marrow edema
involving the T11 vertebral body, which demonstrates patchy
enhancement on post-contrast imaging. Of note, there is also
edema/enhancement of the inferior T10 vertebral body with severe
degenerative disc disease at this level (as detailed below).

Cord: Query subtle cord T2 hyperintensity at T9-T10 (series 22,
image 7) without cord expansion

Paraspinal and other soft tissues: Unremarkable.

Disc levels:

Degenerative disc height loss at T10-T11 with T11 superior Schmorl's
node. There is also posterior disc bulge, facet hypertrophy and
ligamentum flavum thickening at this level resulting in moderate
canal stenosis and severe bilateral foraminal stenosis.

Posterior disc/osteophyte complexes at nearly every thoracic level
with mild to moderate canal stenosis at T2-T3, T3-T4, T4-T5 and
T9-T10 and multilevel mild canal stenosis. At T9-T10, posterior
osteophyte contacts and flattens the ventral cord

There is multilevel facet hypertrophy with at least moderate
foraminal stenosis bilaterally at T1-T2 and on the left at T2-T3, on
the left at C2-C3 and on the right at T4-T5 and T5-T6 with
multilevel mild-to-moderate foraminal stenosis. C7-T1 is not well
imaged due to metallic artifact. Dedicated MRI of the cervical spine
could better characterize the cervicothoracic junction if clinically
indicated.
IMPRESSION: 1. As noted on same day MRI lumbar spine, there is patchy bone
marrow edema involving the T11 vertebral body, which demonstrates
patchy enhancement on post-contrast imaging. Of note, there is also
edema/enhancement of the inferior T10 vertebral body with severe
degenerative disc disease at this level, suggesting that this
process is degenerative/discogenic in etiology.
2. At T10-T11 there is moderate canal stenosis and severe bilateral
foraminal stenosis. There is at least moderate foraminal stenosis
bilaterally at T1-T2, on the left at T2-T3, and the right at T4-T5
and T5-T6 with multilevel mild-to-moderate foraminal stenosis.
3. Mild to moderate canal stenosis atT2-T3, T3-T4, T4-T5, and
T9-T10. Query subtle cord edema or myelomalacia at T9-T10 (series
22, image 7) where posterior osteophyte contacts and deforms the
ventral cord.

## 2023-03-17 ENCOUNTER — Other Ambulatory Visit (HOSPITAL_COMMUNITY): Payer: Self-pay

## 2023-03-17 ENCOUNTER — Other Ambulatory Visit (HOSPITAL_BASED_OUTPATIENT_CLINIC_OR_DEPARTMENT_OTHER): Payer: Self-pay

## 2023-03-27 DIAGNOSIS — I10 Essential (primary) hypertension: Secondary | ICD-10-CM | POA: Diagnosis not present

## 2023-03-27 DIAGNOSIS — E1165 Type 2 diabetes mellitus with hyperglycemia: Secondary | ICD-10-CM | POA: Diagnosis not present

## 2023-03-27 DIAGNOSIS — E785 Hyperlipidemia, unspecified: Secondary | ICD-10-CM | POA: Diagnosis not present

## 2023-03-28 ENCOUNTER — Other Ambulatory Visit: Payer: Self-pay

## 2023-04-01 ENCOUNTER — Other Ambulatory Visit: Payer: Self-pay

## 2023-04-02 ENCOUNTER — Other Ambulatory Visit: Payer: Self-pay

## 2023-04-13 IMAGING — CR DG THORACIC SPINE 2V
3 series · 3 of 3 positions shown · non-contrast
Comparison: MR thoracic spine, 05/04/2020

CLINICAL DATA: Back pain, query T11 fracture

EXAM:
THORACIC SPINE 2 VIEWS

[t thoracic spine ap]
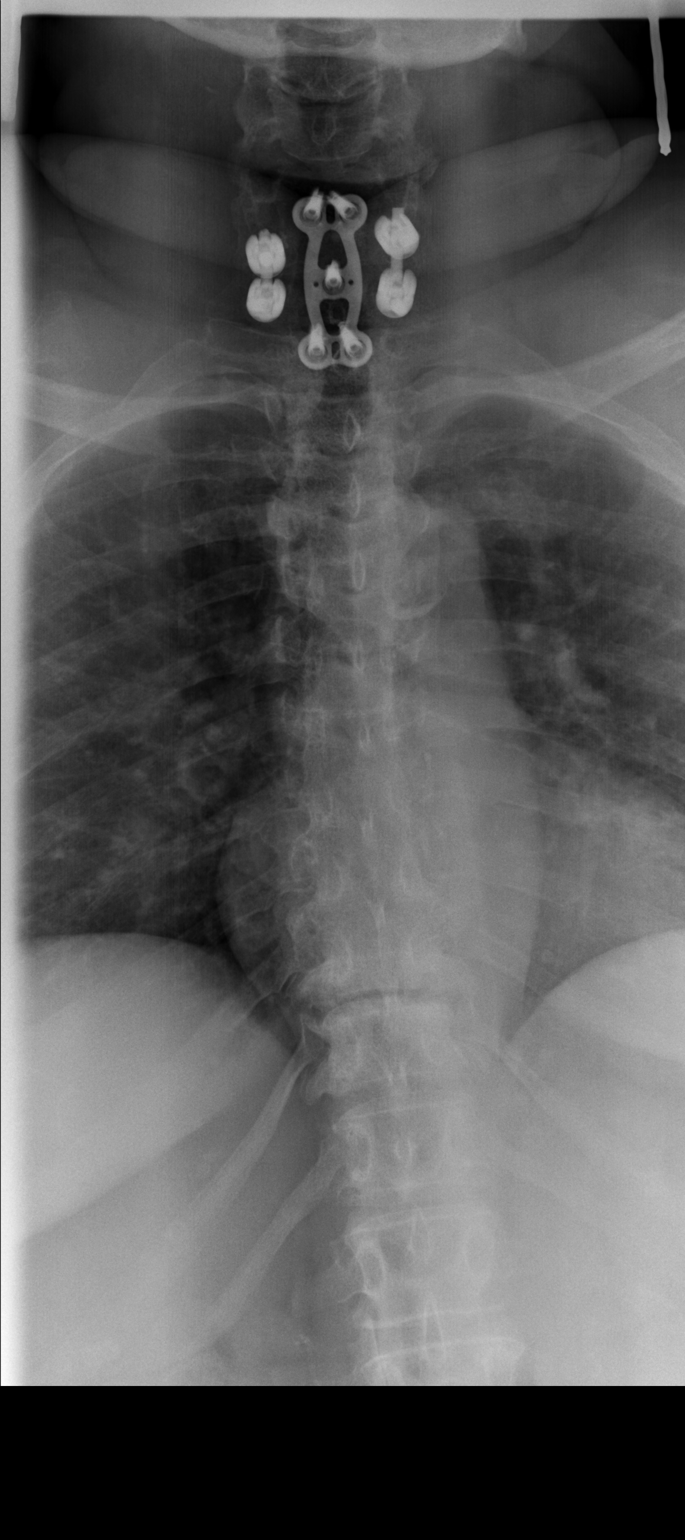

[t thoracic spine lat (1 of 2)]
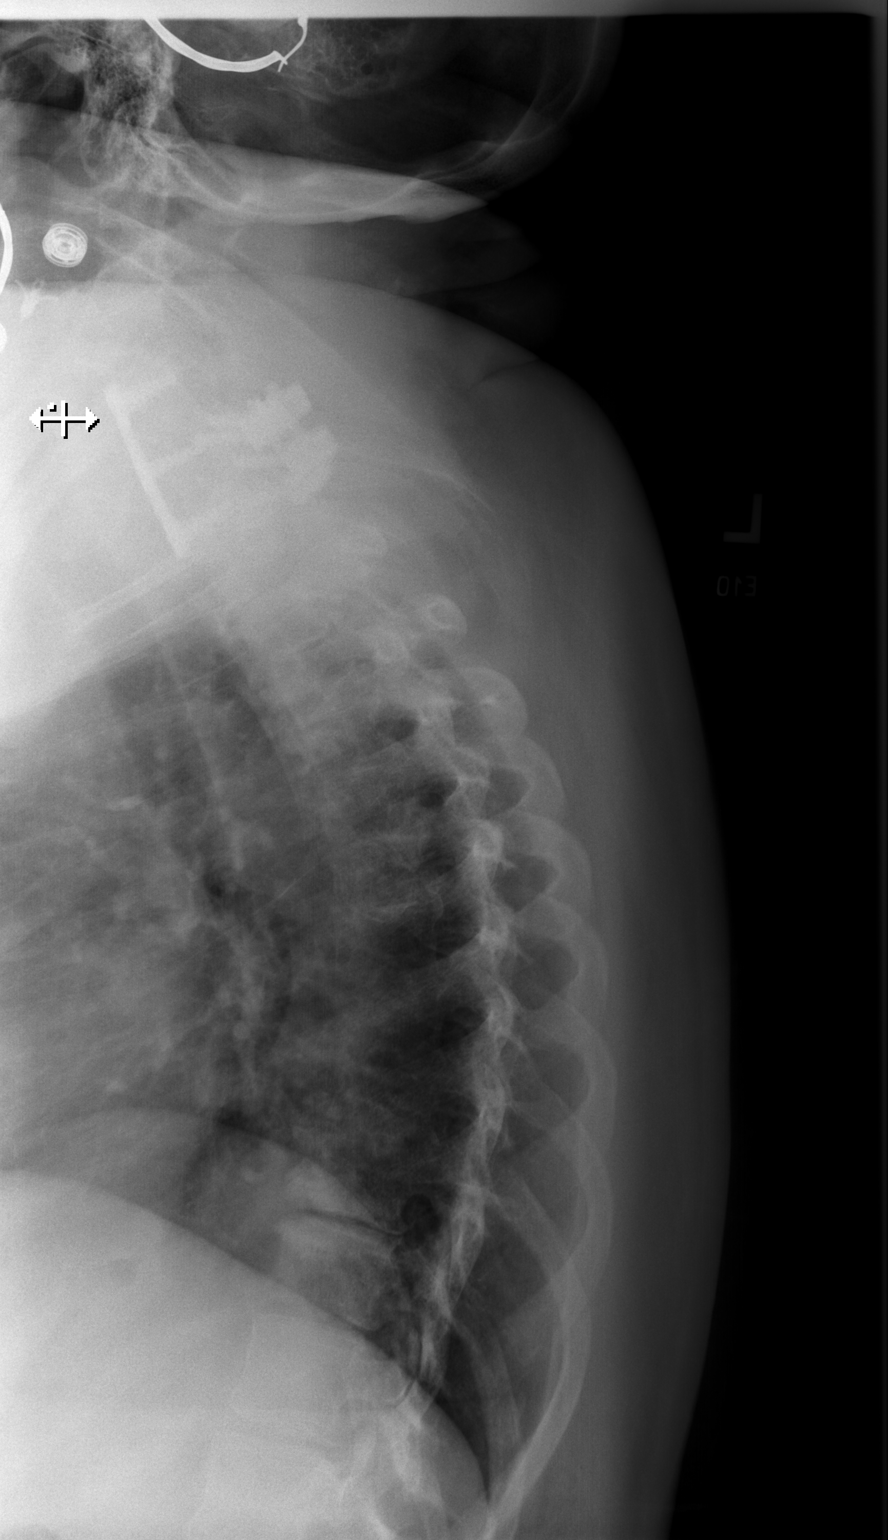

[t thoracic spine lat (2 of 2)]
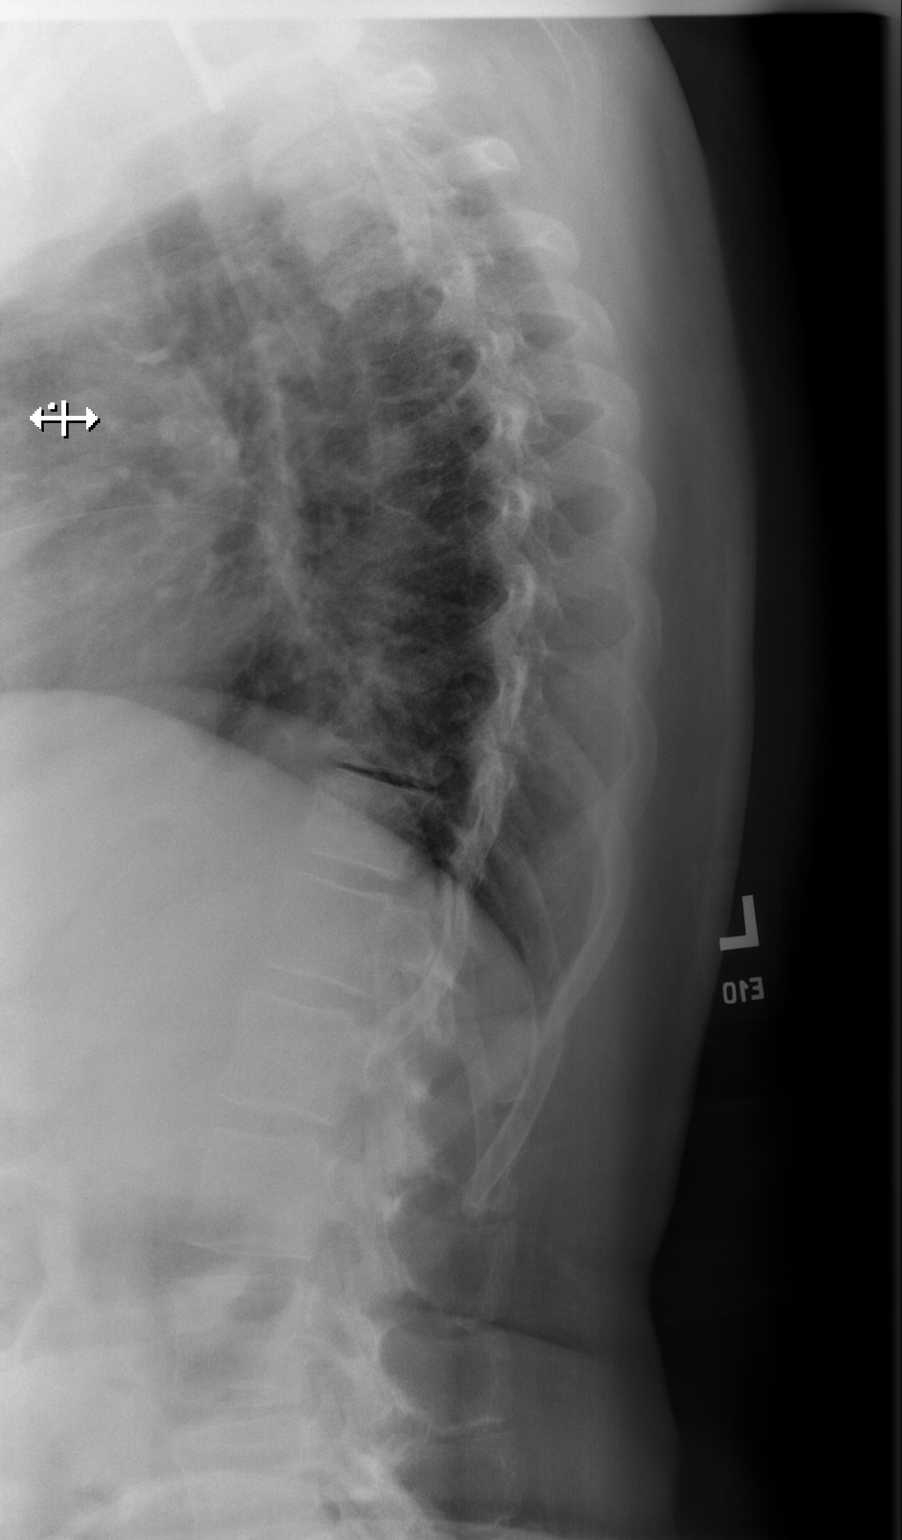

[3 of 3 positions shown; findings below may reference images not displayed]

FINDINGS: There is no evidence of thoracic spine fracture. Alignment is
normal. Moderate multilevel disc degenerative disease throughout,
best detailed by prior MR. No other significant bone abnormalities
are identified.
IMPRESSION: No fracture or dislocation of the thoracic spine. Moderate
multilevel disc degenerative disease throughout, best detailed by
prior MR.

## 2023-04-14 ENCOUNTER — Other Ambulatory Visit (HOSPITAL_COMMUNITY): Payer: Self-pay

## 2023-04-15 ENCOUNTER — Other Ambulatory Visit: Payer: Self-pay

## 2023-04-15 ENCOUNTER — Other Ambulatory Visit (HOSPITAL_COMMUNITY): Payer: Self-pay

## 2023-04-15 DIAGNOSIS — N184 Chronic kidney disease, stage 4 (severe): Secondary | ICD-10-CM | POA: Diagnosis not present

## 2023-04-15 DIAGNOSIS — I129 Hypertensive chronic kidney disease with stage 1 through stage 4 chronic kidney disease, or unspecified chronic kidney disease: Secondary | ICD-10-CM | POA: Diagnosis not present

## 2023-04-15 DIAGNOSIS — K589 Irritable bowel syndrome without diarrhea: Secondary | ICD-10-CM | POA: Diagnosis not present

## 2023-04-15 DIAGNOSIS — E1122 Type 2 diabetes mellitus with diabetic chronic kidney disease: Secondary | ICD-10-CM | POA: Diagnosis not present

## 2023-04-15 MED ORDER — DICYCLOMINE HCL 10 MG PO CAPS
10.0000 mg | ORAL_CAPSULE | Freq: Two times a day (BID) | ORAL | 1 refills | Status: DC
  Filled 2023-04-15 (×2): qty 60, 30d supply, fill #0
  Filled 2023-05-02 – 2023-05-21 (×2): qty 60, 30d supply, fill #1

## 2023-04-21 ENCOUNTER — Other Ambulatory Visit: Payer: Self-pay

## 2023-04-24 ENCOUNTER — Other Ambulatory Visit: Payer: Self-pay

## 2023-04-25 DIAGNOSIS — C642 Malignant neoplasm of left kidney, except renal pelvis: Secondary | ICD-10-CM | POA: Diagnosis not present

## 2023-04-25 DIAGNOSIS — C641 Malignant neoplasm of right kidney, except renal pelvis: Secondary | ICD-10-CM | POA: Diagnosis not present

## 2023-04-29 ENCOUNTER — Other Ambulatory Visit (HOSPITAL_COMMUNITY): Payer: Self-pay

## 2023-04-29 MED ORDER — GVOKE HYPOPEN 2-PACK 1 MG/0.2ML ~~LOC~~ SOAJ
SUBCUTANEOUS | 1 refills | Status: DC
  Filled 2023-04-29: qty 0.4, 2d supply, fill #0

## 2023-04-30 ENCOUNTER — Other Ambulatory Visit: Payer: Self-pay

## 2023-05-02 ENCOUNTER — Other Ambulatory Visit (HOSPITAL_COMMUNITY): Payer: Self-pay

## 2023-05-02 ENCOUNTER — Other Ambulatory Visit: Payer: Self-pay

## 2023-05-02 DIAGNOSIS — C641 Malignant neoplasm of right kidney, except renal pelvis: Secondary | ICD-10-CM | POA: Diagnosis not present

## 2023-05-02 DIAGNOSIS — C642 Malignant neoplasm of left kidney, except renal pelvis: Secondary | ICD-10-CM | POA: Diagnosis not present

## 2023-05-02 MED ORDER — BAQSIMI TWO PACK 3 MG/DOSE NA POWD
NASAL | 1 refills | Status: DC
  Filled 2023-05-02: qty 2, 30d supply, fill #0

## 2023-05-06 ENCOUNTER — Other Ambulatory Visit (HOSPITAL_COMMUNITY): Payer: Self-pay

## 2023-05-06 ENCOUNTER — Other Ambulatory Visit: Payer: Self-pay

## 2023-05-07 ENCOUNTER — Encounter: Payer: Self-pay | Admitting: Pharmacist

## 2023-05-07 ENCOUNTER — Other Ambulatory Visit: Payer: Self-pay

## 2023-05-13 DIAGNOSIS — K589 Irritable bowel syndrome without diarrhea: Secondary | ICD-10-CM | POA: Diagnosis not present

## 2023-05-13 DIAGNOSIS — E1122 Type 2 diabetes mellitus with diabetic chronic kidney disease: Secondary | ICD-10-CM | POA: Diagnosis not present

## 2023-05-13 DIAGNOSIS — N184 Chronic kidney disease, stage 4 (severe): Secondary | ICD-10-CM | POA: Diagnosis not present

## 2023-05-13 DIAGNOSIS — H6122 Impacted cerumen, left ear: Secondary | ICD-10-CM | POA: Diagnosis not present

## 2023-05-15 DIAGNOSIS — H5111 Convergence insufficiency: Secondary | ICD-10-CM | POA: Diagnosis not present

## 2023-05-15 DIAGNOSIS — F0781 Postconcussional syndrome: Secondary | ICD-10-CM | POA: Diagnosis not present

## 2023-05-15 DIAGNOSIS — H532 Diplopia: Secondary | ICD-10-CM | POA: Diagnosis not present

## 2023-05-21 ENCOUNTER — Other Ambulatory Visit: Payer: Self-pay

## 2023-05-22 ENCOUNTER — Other Ambulatory Visit: Payer: Self-pay

## 2023-05-22 IMAGING — MG MM DIGITAL SCREENING BILAT W/ TOMO AND CAD
8 series · 8 of 24 positions shown · non-contrast
Comparison: Previous exam(s).

CLINICAL DATA: Screening.

EXAM:
DIGITAL SCREENING BILATERAL MAMMOGRAM WITH TOMOSYNTHESIS AND CAD
TECHNIQUE: Bilateral screening digital craniocaudal and mediolateral oblique
mammograms were obtained. Bilateral screening digital breast
tomosynthesis was performed. The images were evaluated with
computer-aided detection.

[R CC synth-2D]
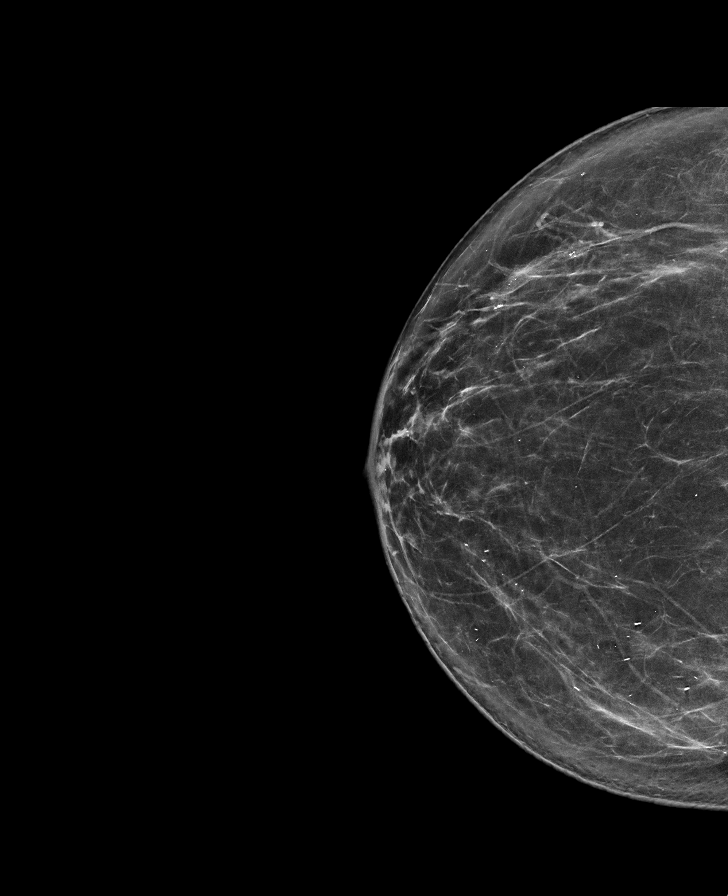

[R MLO synth-2D]
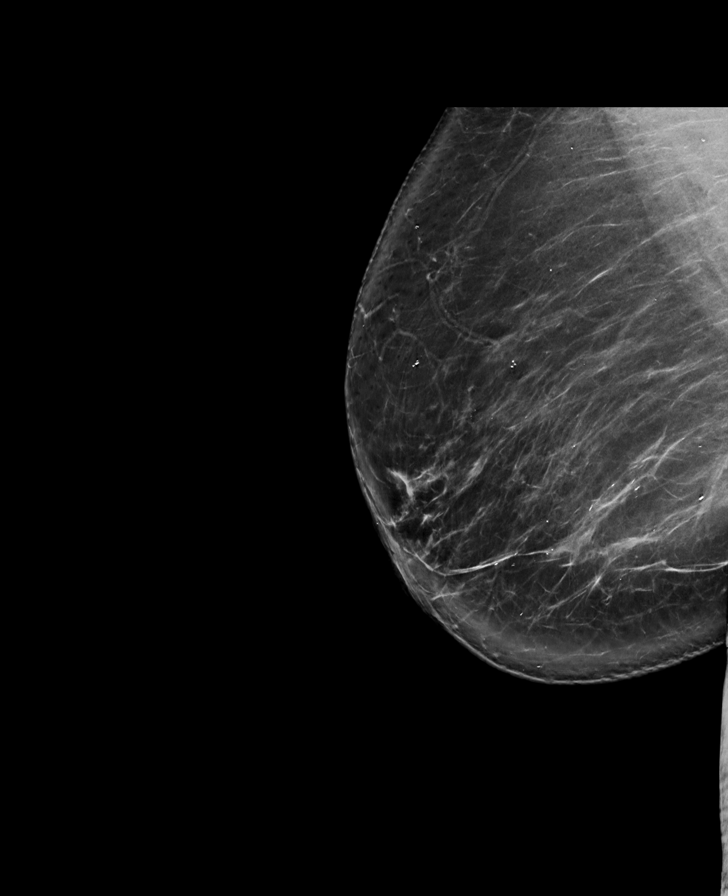

[L MLO synth-2D]
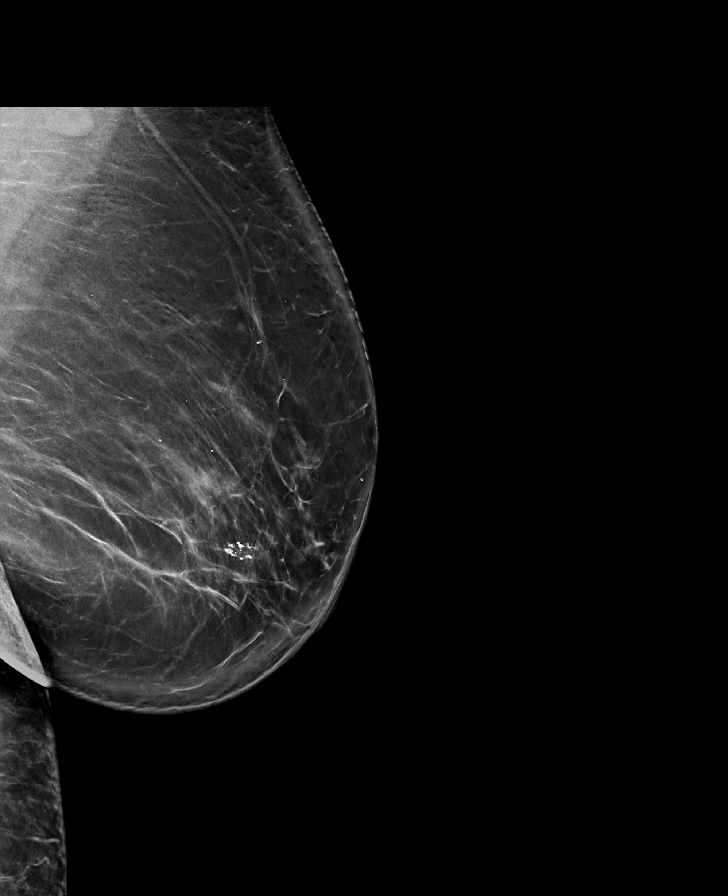

[L CC synth-2D]
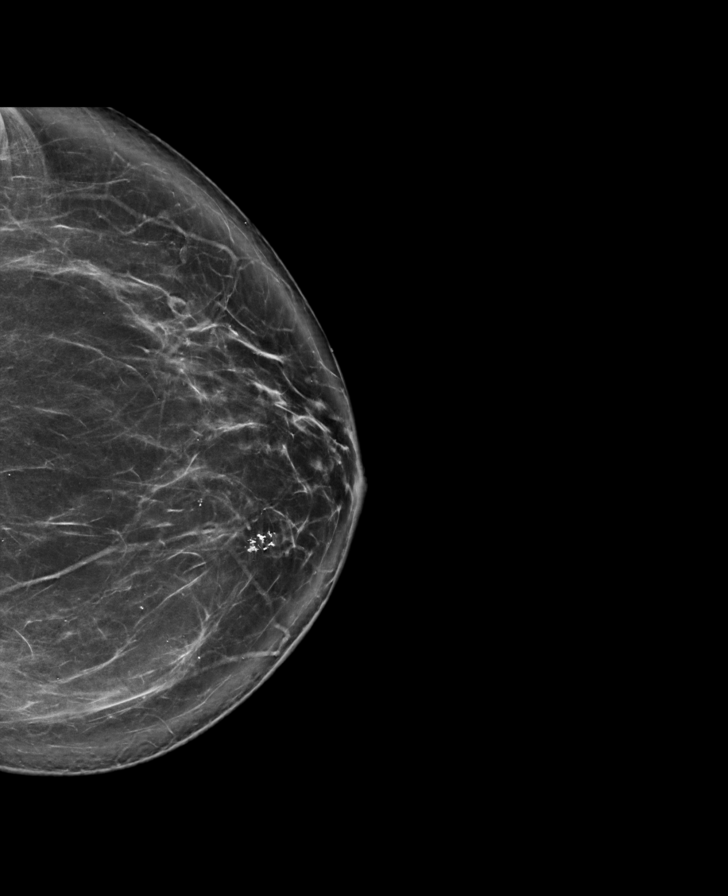

[R CC tomo · tomo slice 41/81.0]
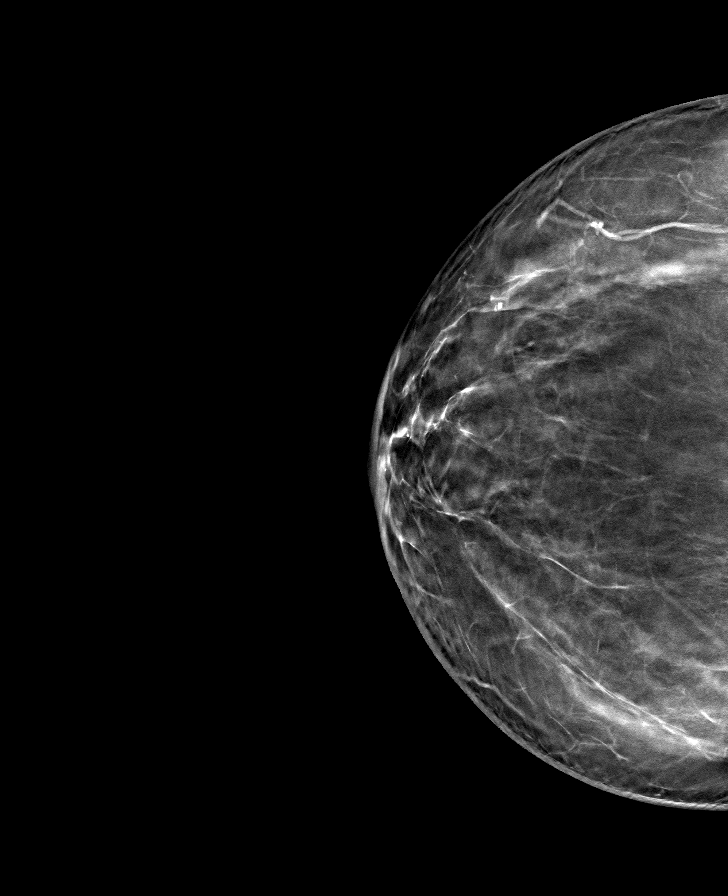

[L CC tomo · tomo slice 47/94.0]
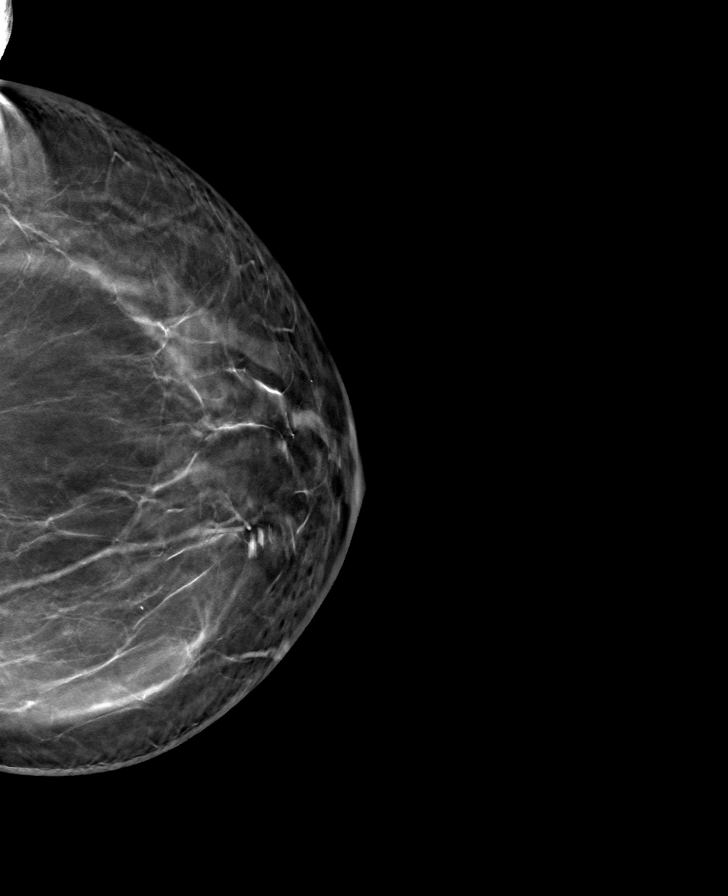

[L MLO tomo · tomo slice 50/99.0]
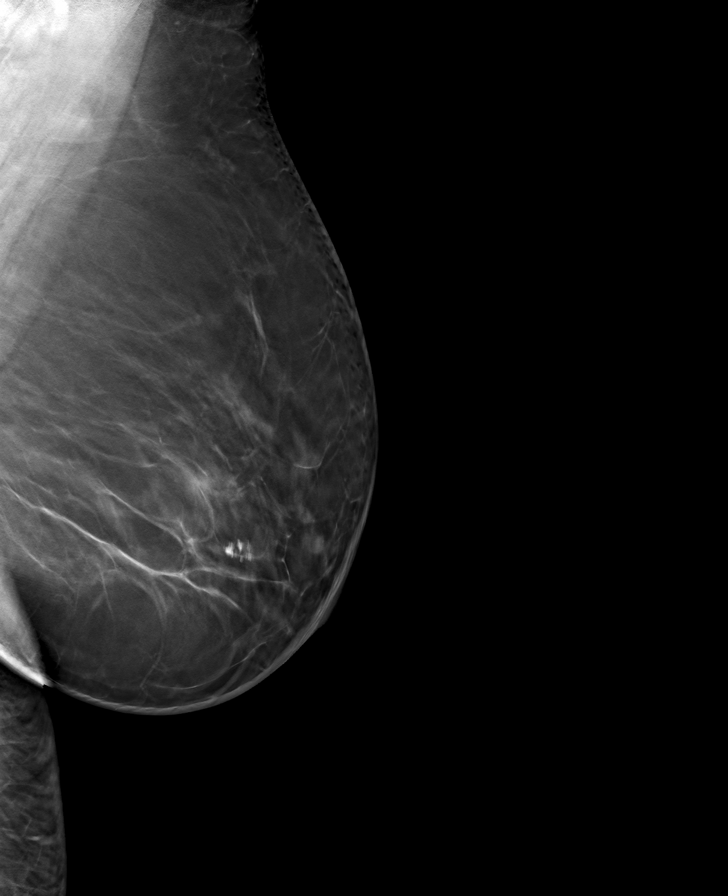

[R MLO tomo · tomo slice 51/100.0]
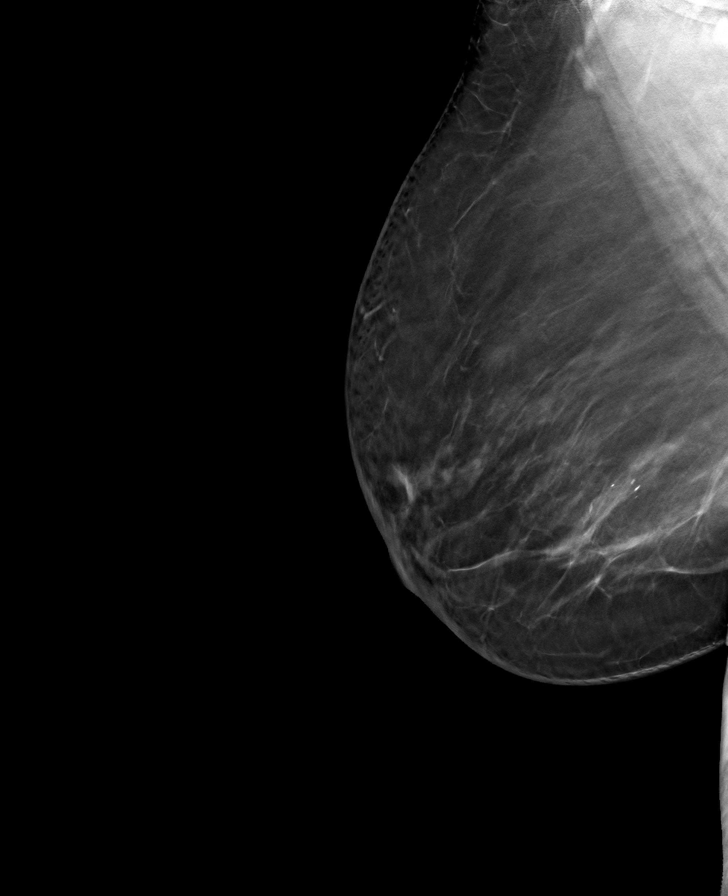

[8 of 24 positions shown; findings below may reference images not displayed]

ACR Breast Density Category b: There are scattered areas of
fibroglandular density.
FINDINGS: There are no findings suspicious for malignancy. The images were
evaluated with computer-aided detection.
IMPRESSION: No mammographic evidence of malignancy. A result letter of this
screening mammogram will be mailed directly to the patient.

RECOMMENDATION:
Screening mammogram in one year. (Code:[id number])

BI-RADS CATEGORY  1: Negative.

## 2023-05-26 ENCOUNTER — Other Ambulatory Visit (HOSPITAL_COMMUNITY): Payer: Self-pay

## 2023-05-27 ENCOUNTER — Other Ambulatory Visit (HOSPITAL_COMMUNITY): Payer: Self-pay

## 2023-06-01 ENCOUNTER — Emergency Department (HOSPITAL_COMMUNITY)

## 2023-06-01 ENCOUNTER — Other Ambulatory Visit: Payer: Self-pay

## 2023-06-01 ENCOUNTER — Encounter (HOSPITAL_COMMUNITY): Payer: Self-pay | Admitting: Emergency Medicine

## 2023-06-01 ENCOUNTER — Emergency Department (HOSPITAL_COMMUNITY)
Admission: EM | Admit: 2023-06-01 | Discharge: 2023-06-02 | Disposition: A | Discharge status: Home / Self Care | Attending: Emergency Medicine | Admitting: Emergency Medicine

## 2023-06-01 DIAGNOSIS — E109 Type 1 diabetes mellitus without complications: Secondary | ICD-10-CM | POA: Insufficient documentation

## 2023-06-01 DIAGNOSIS — Z8673 Personal history of transient ischemic attack (TIA), and cerebral infarction without residual deficits: Secondary | ICD-10-CM | POA: Diagnosis not present

## 2023-06-01 DIAGNOSIS — I10 Essential (primary) hypertension: Secondary | ICD-10-CM | POA: Insufficient documentation

## 2023-06-01 DIAGNOSIS — I6782 Cerebral ischemia: Secondary | ICD-10-CM | POA: Diagnosis not present

## 2023-06-01 DIAGNOSIS — R110 Nausea: Secondary | ICD-10-CM | POA: Diagnosis not present

## 2023-06-01 DIAGNOSIS — R0602 Shortness of breath: Secondary | ICD-10-CM | POA: Diagnosis not present

## 2023-06-01 DIAGNOSIS — R519 Headache, unspecified: Secondary | ICD-10-CM | POA: Diagnosis not present

## 2023-06-01 DIAGNOSIS — Z981 Arthrodesis status: Secondary | ICD-10-CM | POA: Diagnosis not present

## 2023-06-01 DIAGNOSIS — R42 Dizziness and giddiness: Secondary | ICD-10-CM | POA: Diagnosis not present

## 2023-06-01 DIAGNOSIS — J45909 Unspecified asthma, uncomplicated: Secondary | ICD-10-CM | POA: Diagnosis not present

## 2023-06-01 DIAGNOSIS — R55 Syncope and collapse: Secondary | ICD-10-CM | POA: Diagnosis not present

## 2023-06-01 LAB — BASIC METABOLIC PANEL WITH GFR
Anion gap: 9 (ref 5–15)
BUN: 32 mg/dL — ABNORMAL HIGH (ref 8–23)
CO2: 26 mmol/L (ref 22–32)
Calcium: 10 mg/dL (ref 8.9–10.3)
Chloride: 106 mmol/L (ref 98–111)
Creatinine, Ser: 1.72 mg/dL — ABNORMAL HIGH (ref 0.44–1.00)
GFR, Estimated: 29 mL/min — ABNORMAL LOW (ref >60)
Glucose, Bld: 129 mg/dL — ABNORMAL HIGH (ref 70–99)
Potassium: 4.1 mmol/L (ref 3.5–5.1)
Sodium: 141 mmol/L (ref 135–145)

## 2023-06-01 LAB — CBC
HCT: 41.8 % (ref 36.0–46.0)
Hemoglobin: 13.4 g/dL (ref 12.0–15.0)
MCH: 30.9 pg (ref 26.0–34.0)
MCHC: 32.1 g/dL (ref 30.0–36.0)
MCV: 96.3 fL (ref 80.0–100.0)
Platelets: 241 10*3/uL (ref 150–400)
RBC: 4.34 MIL/uL (ref 3.87–5.11)
RDW: 13.5 % (ref 11.5–15.5)
WBC: 4.8 10*3/uL (ref 4.0–10.5)
nRBC: 0 % (ref 0.0–0.2)

## 2023-06-01 LAB — RESP PANEL BY RT-PCR (RSV, FLU A&B, COVID)  RVPGX2
Influenza A by PCR: NEGATIVE
Influenza B by PCR: NEGATIVE
Resp Syncytial Virus by PCR: NEGATIVE
SARS Coronavirus 2 by RT PCR: NEGATIVE

## 2023-06-01 NOTE — ED Provider Notes (Signed)
 Emergency Department Provider Note   I have reviewed the triage vital signs and the nursing notes.   HISTORY  Chief Complaint Dizziness (Nausea /)   HPI [name] is a 83 y.o. female past history of ACS, diabetes, GERD, hypertension presents to the emergency department with lightheadedness, leg swelling, total body aches.  The leg swelling and bodyaches and going on for several weeks gradually worsening.  She states she was at church today when she felt near syncope symptoms which caused her to sit down.  She did not fully lose consciousness.  She denies any vertigo.  No unilateral weakness or numbness.  She did have a mild headache which is not typical for her.  She has been compliant with her home medications.Denies abdominal or chest pain.    Past Medical History:  Diagnosis Date   ACS (acute coronary syndrome) (HCC) 12/27/2010   AKI (acute kidney injury) (HCC) 03/25/2018   Anginal pain (HCC)    Arthritis    Asthma    Diabetes mellitus    Type 1   Dysrhythmia    "irregular heart beat"   GERD (gastroesophageal reflux disease)    History of urinary tract infection    Hypercholesteremia    Hypertension    left renal ca dx'd 2012 (?)   surg only, left kidney   Nocturia    Reflux    Renal failure (ARF), acute on chronic (HCC)    Shortness of breath dyspnea    pt denies; states can climb stairs w/o difficulty    Sleep apnea    does not use c-pap machine   Tingling in extremities    legs bilat   Tinnitus    Urinary frequency    Urinary incontinence    Vertigo    occurs when lying flat     Review of Systems  Constitutional: No fever/chills  Cardiovascular: Denies chest pain. Positive near syncope.  Respiratory: Denies shortness of breath. Gastrointestinal: No abdominal pain. Positive nausea, no vomiting.  Genitourinary: Negative for dysuria. Musculoskeletal: Negative for back pain. Skin: Negative for rash. Neurological: Negative for headaches, focal weakness  or numbness.   ____________________________________________   PHYSICAL EXAM:  VITAL SIGNS: ED Triage Vitals  Encounter Vitals Group     BP 06/01/23 1828 (!) 185/72     Pulse Rate 06/01/23 1828 71     Resp 06/01/23 1828 16     Temp 06/01/23 1828 99.1 F (37.3 C)     Temp Source 06/01/23 1828 Oral     SpO2 06/01/23 1828 99 %     Weight 06/01/23 1832 147 lb (66.7 kg)     Height 06/01/23 1832 4\' 11"  (1.499 m)   Constitutional: Alert and oriented. Well appearing and in no acute distress. Eyes: Conjunctivae are normal.  Head: Atraumatic. Nose: No congestion/rhinnorhea. Mouth/Throat: Mucous membranes are moist.   Neck: No stridor.   Cardiovascular: Normal rate, regular rhythm. Good peripheral circulation. Grossly normal heart sounds.   Respiratory: Normal respiratory effort.  No retractions. Lungs CTAB. Gastrointestinal: Soft and nontender. No distention.  Musculoskeletal: No lower extremity tenderness nor edema. No gross deformities of extremities. Neurologic:  Normal speech and language. No gross focal neurologic deficits are appreciated.  Skin:  Skin is warm, dry and intact. No rash noted.  ____________________________________________   LABS (all labs ordered are listed, but only abnormal results are displayed)  Labs Reviewed  BASIC METABOLIC PANEL WITH GFR - Abnormal; Notable for the following components:  Result Value   Glucose, Bld 129 (*)    BUN 32 (*)    Creatinine, Ser 1.72 (*)    GFR, Estimated 29 (*)    All other components within normal limits  URINALYSIS, ROUTINE W REFLEX MICROSCOPIC - Abnormal; Notable for the following components:   Color, Urine STRAW (*)    Glucose, UA >=500 (*)    Protein, ur 100 (*)    All other components within normal limits  MAGNESIUM - Abnormal; Notable for the following components:   Magnesium 2.7 (*)    All other components within normal limits  CBG MONITORING, ED - Abnormal; Notable for the following components:    Glucose-Capillary 110 (*)    All other components within normal limits  RESP PANEL BY RT-PCR (RSV, FLU A&B, COVID)  RVPGX2  CBC  HEPATIC FUNCTION PANEL  LIPASE, BLOOD  CK  BRAIN NATRIURETIC PEPTIDE  TROPONIN I (HIGH SENSITIVITY)  TROPONIN I (HIGH SENSITIVITY)   ____________________________________________  EKG   EKG Interpretation Date/Time:  Sunday June 01 2023 18:36:25 EDT Ventricular Rate:  68 PR Interval:  181 QRS Duration:  76 QT Interval:  408 QTC Calculation: 434 R Axis:   57  Text Interpretation: Sinus rhythm Low voltage, precordial leads Probable anteroseptal infarct, old Minimal ST elevation, inferior leads ST changes similar to prior Confirmed by Abby Hocking [phone]) on 06/01/2023 11:11:22 PM       ____________________________________________   PROCEDURES  Procedure(s) performed:   Procedures  None  ____________________________________________   INITIAL IMPRESSION / ASSESSMENT AND PLAN / ED COURSE  Pertinent labs & imaging results that were available during my care of the patient were reviewed by me and considered in my medical decision making (see chart for details).   This patient is Presenting for Evaluation of dizziness, which does require a range of treatment options, and is a complaint that involves a high risk of morbidity and mortality.  The Differential Diagnoses includes but is not exclusive to acute coronary syndrome, aortic dissection, pulmonary embolism, cardiac tamponade, community-acquired pneumonia, pericarditis, musculoskeletal chest wall pain, etc.  Clinical Laboratory Tests Ordered, included BMP with CKD near baseline. CBC without anemia. No leukocytosis. COVID negative.   Radiologic Tests Ordered, included CT head and CXR. I independently interpreted the images and agree with radiology interpretation.   Cardiac Monitor Tracing which shows NSR.    Social Determinants of Health Risk patient is a non-smoker.   Medical Decision  Making: Summary:  The patient presents to the emergency department with near syncope today.  Mild swelling in the feet.  No focal neurodeficits to suspect stroke.  Vital signs not consistent with SIRS or developing sepsis.  Low suspicion for ACS but will need screening blood work and CT head with HA atypical for her but no sudden onset/max intensity HA symptoms.   Reevaluation with update and discussion with patient. MRI brain pending. Care transferred to Dr. Daivd Dub.   Patient's presentation is most consistent with acute presentation with potential threat to life or bodily function.   Disposition: pending  ____________________________________________  FINAL CLINICAL IMPRESSION(S) / ED DIAGNOSES  Final diagnoses:  Dizziness  Vertigo     NEW OUTPATIENT MEDICATIONS STARTED DURING THIS VISIT:  Discharge Medication List as of 06/02/2023  9:05 AM     START taking these medications   Details  meclizine (ANTIVERT) 12.5 MG tablet Take 1 tablet (12.5 mg total) by mouth 3 (three) times daily as needed for dizziness., Starting Mon 06/02/2023, Normal  Note:  This document was prepared using Dragon voice recognition software and may include unintentional dictation errors.  Abby Hocking, MD, [facility]    Blanchie Zeleznik, Shereen Dike, MD 06/05/23 [phone]

## 2023-06-01 NOTE — ED Triage Notes (Signed)
 Patient comes in for dizziness, nausea and body aches that started yesterday.

## 2023-06-02 ENCOUNTER — Other Ambulatory Visit: Payer: Self-pay

## 2023-06-02 ENCOUNTER — Emergency Department (HOSPITAL_COMMUNITY)

## 2023-06-02 ENCOUNTER — Other Ambulatory Visit (HOSPITAL_COMMUNITY): Payer: Self-pay

## 2023-06-02 DIAGNOSIS — I6782 Cerebral ischemia: Secondary | ICD-10-CM | POA: Diagnosis not present

## 2023-06-02 DIAGNOSIS — Z8673 Personal history of transient ischemic attack (TIA), and cerebral infarction without residual deficits: Secondary | ICD-10-CM | POA: Diagnosis not present

## 2023-06-02 DIAGNOSIS — R110 Nausea: Secondary | ICD-10-CM | POA: Diagnosis not present

## 2023-06-02 DIAGNOSIS — R42 Dizziness and giddiness: Secondary | ICD-10-CM | POA: Diagnosis not present

## 2023-06-02 LAB — URINALYSIS, ROUTINE W REFLEX MICROSCOPIC
Bacteria, UA: NONE SEEN
Bilirubin Urine: NEGATIVE
Glucose, UA: >=500 mg/dL — AB
Hgb urine dipstick: NEGATIVE
Ketones, ur: NEGATIVE mg/dL
Leukocytes,Ua: NEGATIVE
Nitrite: NEGATIVE
Protein, ur: 100 mg/dL — AB
Specific Gravity, Urine: 1.016 (ref 1.005–1.030)
pH: 6 (ref 5.0–8.0)

## 2023-06-02 LAB — BRAIN NATRIURETIC PEPTIDE: B Natriuretic Peptide: 49.2 pg/mL (ref 0.0–100.0)

## 2023-06-02 LAB — HEPATIC FUNCTION PANEL
ALT: 16 U/L (ref 0–44)
AST: 23 U/L (ref 15–41)
Albumin: 4.2 g/dL (ref 3.5–5.0)
Alkaline Phosphatase: 61 U/L (ref 38–126)
Bilirubin, Direct: 0.2 mg/dL (ref 0.0–0.2)
Indirect Bilirubin: 0.5 mg/dL (ref 0.3–0.9)
Total Bilirubin: 0.7 mg/dL (ref 0.0–1.2)
Total Protein: 8 g/dL (ref 6.5–8.1)

## 2023-06-02 LAB — CBG MONITORING, ED: Glucose-Capillary: 110 mg/dL — ABNORMAL HIGH (ref 70–99)

## 2023-06-02 LAB — LIPASE, BLOOD: Lipase: 46 U/L (ref 11–51)

## 2023-06-02 LAB — TROPONIN I (HIGH SENSITIVITY)
Troponin I (High Sensitivity): 4 ng/L (ref <18)
Troponin I (High Sensitivity): 4 ng/L (ref <18)

## 2023-06-02 LAB — CK: Total CK: 99 U/L (ref 38–234)

## 2023-06-02 LAB — MAGNESIUM: Magnesium: 2.7 mg/dL — ABNORMAL HIGH (ref 1.7–2.4)

## 2023-06-02 MED ORDER — MECLIZINE HCL 25 MG PO TABS
12.5000 mg | ORAL_TABLET | Freq: Once | ORAL | Status: AC
  Administered 2023-06-02: 12.5 mg via ORAL
  Filled 2023-06-02: qty 1

## 2023-06-02 MED ORDER — ACETAMINOPHEN 500 MG PO TABS
1000.0000 mg | ORAL_TABLET | Freq: Once | ORAL | Status: AC
  Administered 2023-06-02: 1000 mg via ORAL
  Filled 2023-06-02: qty 2

## 2023-06-02 MED ORDER — MECLIZINE HCL 12.5 MG PO TABS
12.5000 mg | ORAL_TABLET | Freq: Three times a day (TID) | ORAL | 0 refills | Status: DC | PRN
  Filled 2023-06-02: qty 30, 10d supply, fill #0

## 2023-06-02 NOTE — ED Notes (Signed)
 Patient transported to MRI

## 2023-06-02 NOTE — ED Notes (Signed)
 Patient returned from MRI.

## 2023-06-02 NOTE — Discharge Instructions (Addendum)
 1.  Your MRI did not show any evidence of a stroke. 2.  Some of the symptoms you describe sound like vertigo.  You report you have had vertigo in the past.  You may try meclizine half or 1 tablet to see if this is helpful.  If it does not improve your symptoms, you do not need to take this medication. 3.  Follow-up with your family doctor for recheck as soon as possible. 4.  Return to the emergency department if you have new worsening or concerning symptoms.

## 2023-06-02 NOTE — ED Notes (Signed)
 Patient's daughter called to get update on the patient

## 2023-06-02 NOTE — ED Provider Notes (Signed)
 Dizzy, lightheaded. Getting hydration and MRI to r/o CVA. If normal, probable d/c. Physical Exam  BP (!) 148/67 (BP Location: Right Arm)   Pulse 60   Temp 98.1 F (36.7 C) (Oral)   Resp 20   Ht 4\' 11"  (1.499 m)   Wt 66.7 kg   SpO2 96%   BMI 29.69 kg/m   Physical Exam  Procedures  Procedures  ED Course / MDM    Medical Decision Making Amount and/or Complexity of Data Reviewed Labs: ordered. Radiology: ordered.  Risk OTC drugs.    MRI no acute findings.  I discussed the findings and the patient's symptoms.  Patient reports she does have a history of vertigo and it occurs if she lies flat.  She reports symptoms have been controlled as long as she does not lie flat.  She did not think this was vertigo because it was not as severe.  She however does now think it may have been because she had nausea and feeling of incoordination and dizziness.  We discussed the fact that vertigo can be less severe than incapacitating.  At this time we discussed trial of meclizine to see if this helps with symptoms and close follow-up with PCP.  Patient voices understanding.  She is alert and in good condition.      Wynetta Heckle, MD 06/02/23 [phone]

## 2023-06-02 NOTE — ED Notes (Signed)
 Ambulated patient with assistance to bathroom. Patient unsteady and dizzy. Had to roll her back on wheelchair back to room

## 2023-06-09 ENCOUNTER — Other Ambulatory Visit: Payer: Self-pay

## 2023-06-09 DIAGNOSIS — R1012 Left upper quadrant pain: Secondary | ICD-10-CM | POA: Diagnosis not present

## 2023-06-09 DIAGNOSIS — E1169 Type 2 diabetes mellitus with other specified complication: Secondary | ICD-10-CM | POA: Diagnosis not present

## 2023-06-09 DIAGNOSIS — S20212A Contusion of left front wall of thorax, initial encounter: Secondary | ICD-10-CM | POA: Diagnosis not present

## 2023-06-09 DIAGNOSIS — S20212D Contusion of left front wall of thorax, subsequent encounter: Secondary | ICD-10-CM | POA: Diagnosis not present

## 2023-06-09 DIAGNOSIS — M542 Cervicalgia: Secondary | ICD-10-CM | POA: Diagnosis not present

## 2023-06-09 DIAGNOSIS — E1165 Type 2 diabetes mellitus with hyperglycemia: Secondary | ICD-10-CM | POA: Diagnosis not present

## 2023-06-09 DIAGNOSIS — M79652 Pain in left thigh: Secondary | ICD-10-CM | POA: Diagnosis not present

## 2023-06-17 ENCOUNTER — Other Ambulatory Visit: Payer: Self-pay

## 2023-06-18 ENCOUNTER — Encounter (INDEPENDENT_AMBULATORY_CARE_PROVIDER_SITE_OTHER): Payer: Medicare PPO | Admitting: Ophthalmology

## 2023-06-18 DIAGNOSIS — Z794 Long term (current) use of insulin: Secondary | ICD-10-CM

## 2023-06-18 DIAGNOSIS — H43813 Vitreous degeneration, bilateral: Secondary | ICD-10-CM | POA: Diagnosis not present

## 2023-06-18 DIAGNOSIS — H35033 Hypertensive retinopathy, bilateral: Secondary | ICD-10-CM

## 2023-06-18 DIAGNOSIS — E113393 Type 2 diabetes mellitus with moderate nonproliferative diabetic retinopathy without macular edema, bilateral: Secondary | ICD-10-CM | POA: Diagnosis not present

## 2023-06-18 DIAGNOSIS — I10 Essential (primary) hypertension: Secondary | ICD-10-CM | POA: Diagnosis not present

## 2023-06-19 ENCOUNTER — Other Ambulatory Visit: Payer: Self-pay

## 2023-06-20 ENCOUNTER — Other Ambulatory Visit: Payer: Self-pay

## 2023-06-20 ENCOUNTER — Other Ambulatory Visit (HOSPITAL_COMMUNITY): Payer: Self-pay

## 2023-06-20 MED ORDER — DICYCLOMINE HCL 10 MG PO CAPS
ORAL_CAPSULE | ORAL | 1 refills | Status: DC
  Filled 2023-06-20 (×2): qty 60, 30d supply, fill #0
  Filled 2023-07-04 – 2023-07-16 (×3): qty 60, 30d supply, fill #1
  Filled ????-??-??: fill #1

## 2023-06-23 ENCOUNTER — Other Ambulatory Visit (HOSPITAL_COMMUNITY): Payer: Self-pay

## 2023-06-23 ENCOUNTER — Other Ambulatory Visit: Payer: Self-pay

## 2023-06-23 DIAGNOSIS — E1122 Type 2 diabetes mellitus with diabetic chronic kidney disease: Secondary | ICD-10-CM | POA: Diagnosis not present

## 2023-06-23 DIAGNOSIS — K580 Irritable bowel syndrome with diarrhea: Secondary | ICD-10-CM | POA: Diagnosis not present

## 2023-06-23 DIAGNOSIS — N3946 Mixed incontinence: Secondary | ICD-10-CM | POA: Diagnosis not present

## 2023-06-23 DIAGNOSIS — Z794 Long term (current) use of insulin: Secondary | ICD-10-CM | POA: Diagnosis not present

## 2023-06-23 DIAGNOSIS — M150 Primary generalized (osteo)arthritis: Secondary | ICD-10-CM | POA: Diagnosis not present

## 2023-06-23 DIAGNOSIS — I129 Hypertensive chronic kidney disease with stage 1 through stage 4 chronic kidney disease, or unspecified chronic kidney disease: Secondary | ICD-10-CM | POA: Diagnosis not present

## 2023-06-23 DIAGNOSIS — N184 Chronic kidney disease, stage 4 (severe): Secondary | ICD-10-CM | POA: Diagnosis not present

## 2023-06-23 DIAGNOSIS — E1141 Type 2 diabetes mellitus with diabetic mononeuropathy: Secondary | ICD-10-CM | POA: Diagnosis not present

## 2023-06-23 MED ORDER — FUROSEMIDE 20 MG PO TABS
10.0000 mg | ORAL_TABLET | Freq: Every day | ORAL | 0 refills | Status: DC
  Filled 2023-06-23 – 2023-06-24 (×2): qty 45, 90d supply, fill #0

## 2023-06-24 ENCOUNTER — Other Ambulatory Visit: Payer: Self-pay

## 2023-06-24 ENCOUNTER — Other Ambulatory Visit (HOSPITAL_COMMUNITY): Payer: Self-pay

## 2023-07-01 ENCOUNTER — Other Ambulatory Visit (HOSPITAL_COMMUNITY): Payer: Self-pay

## 2023-07-01 DIAGNOSIS — E785 Hyperlipidemia, unspecified: Secondary | ICD-10-CM | POA: Diagnosis not present

## 2023-07-01 DIAGNOSIS — E1165 Type 2 diabetes mellitus with hyperglycemia: Secondary | ICD-10-CM | POA: Diagnosis not present

## 2023-07-01 DIAGNOSIS — I10 Essential (primary) hypertension: Secondary | ICD-10-CM | POA: Diagnosis not present

## 2023-07-03 ENCOUNTER — Other Ambulatory Visit (HOSPITAL_COMMUNITY): Payer: Self-pay

## 2023-07-03 ENCOUNTER — Other Ambulatory Visit: Payer: Self-pay | Admitting: Cardiology

## 2023-07-04 ENCOUNTER — Other Ambulatory Visit (HOSPITAL_COMMUNITY): Payer: Self-pay

## 2023-07-04 ENCOUNTER — Other Ambulatory Visit: Payer: Self-pay

## 2023-07-07 ENCOUNTER — Other Ambulatory Visit (HOSPITAL_COMMUNITY): Payer: Self-pay

## 2023-07-07 DIAGNOSIS — N184 Chronic kidney disease, stage 4 (severe): Secondary | ICD-10-CM | POA: Diagnosis not present

## 2023-07-07 DIAGNOSIS — E1122 Type 2 diabetes mellitus with diabetic chronic kidney disease: Secondary | ICD-10-CM | POA: Diagnosis not present

## 2023-07-07 DIAGNOSIS — I129 Hypertensive chronic kidney disease with stage 1 through stage 4 chronic kidney disease, or unspecified chronic kidney disease: Secondary | ICD-10-CM | POA: Diagnosis not present

## 2023-07-07 DIAGNOSIS — Z794 Long term (current) use of insulin: Secondary | ICD-10-CM | POA: Diagnosis not present

## 2023-07-07 DIAGNOSIS — E1165 Type 2 diabetes mellitus with hyperglycemia: Secondary | ICD-10-CM | POA: Diagnosis not present

## 2023-07-09 IMAGING — CR DG CHEST 2V
2 series · 2 of 2 positions shown · non-contrast
Comparison: 03/01/2020

CLINICAL DATA: Cough, COVID 10 days ago

EXAM:
CHEST - 2 VIEW

[w chest pa]
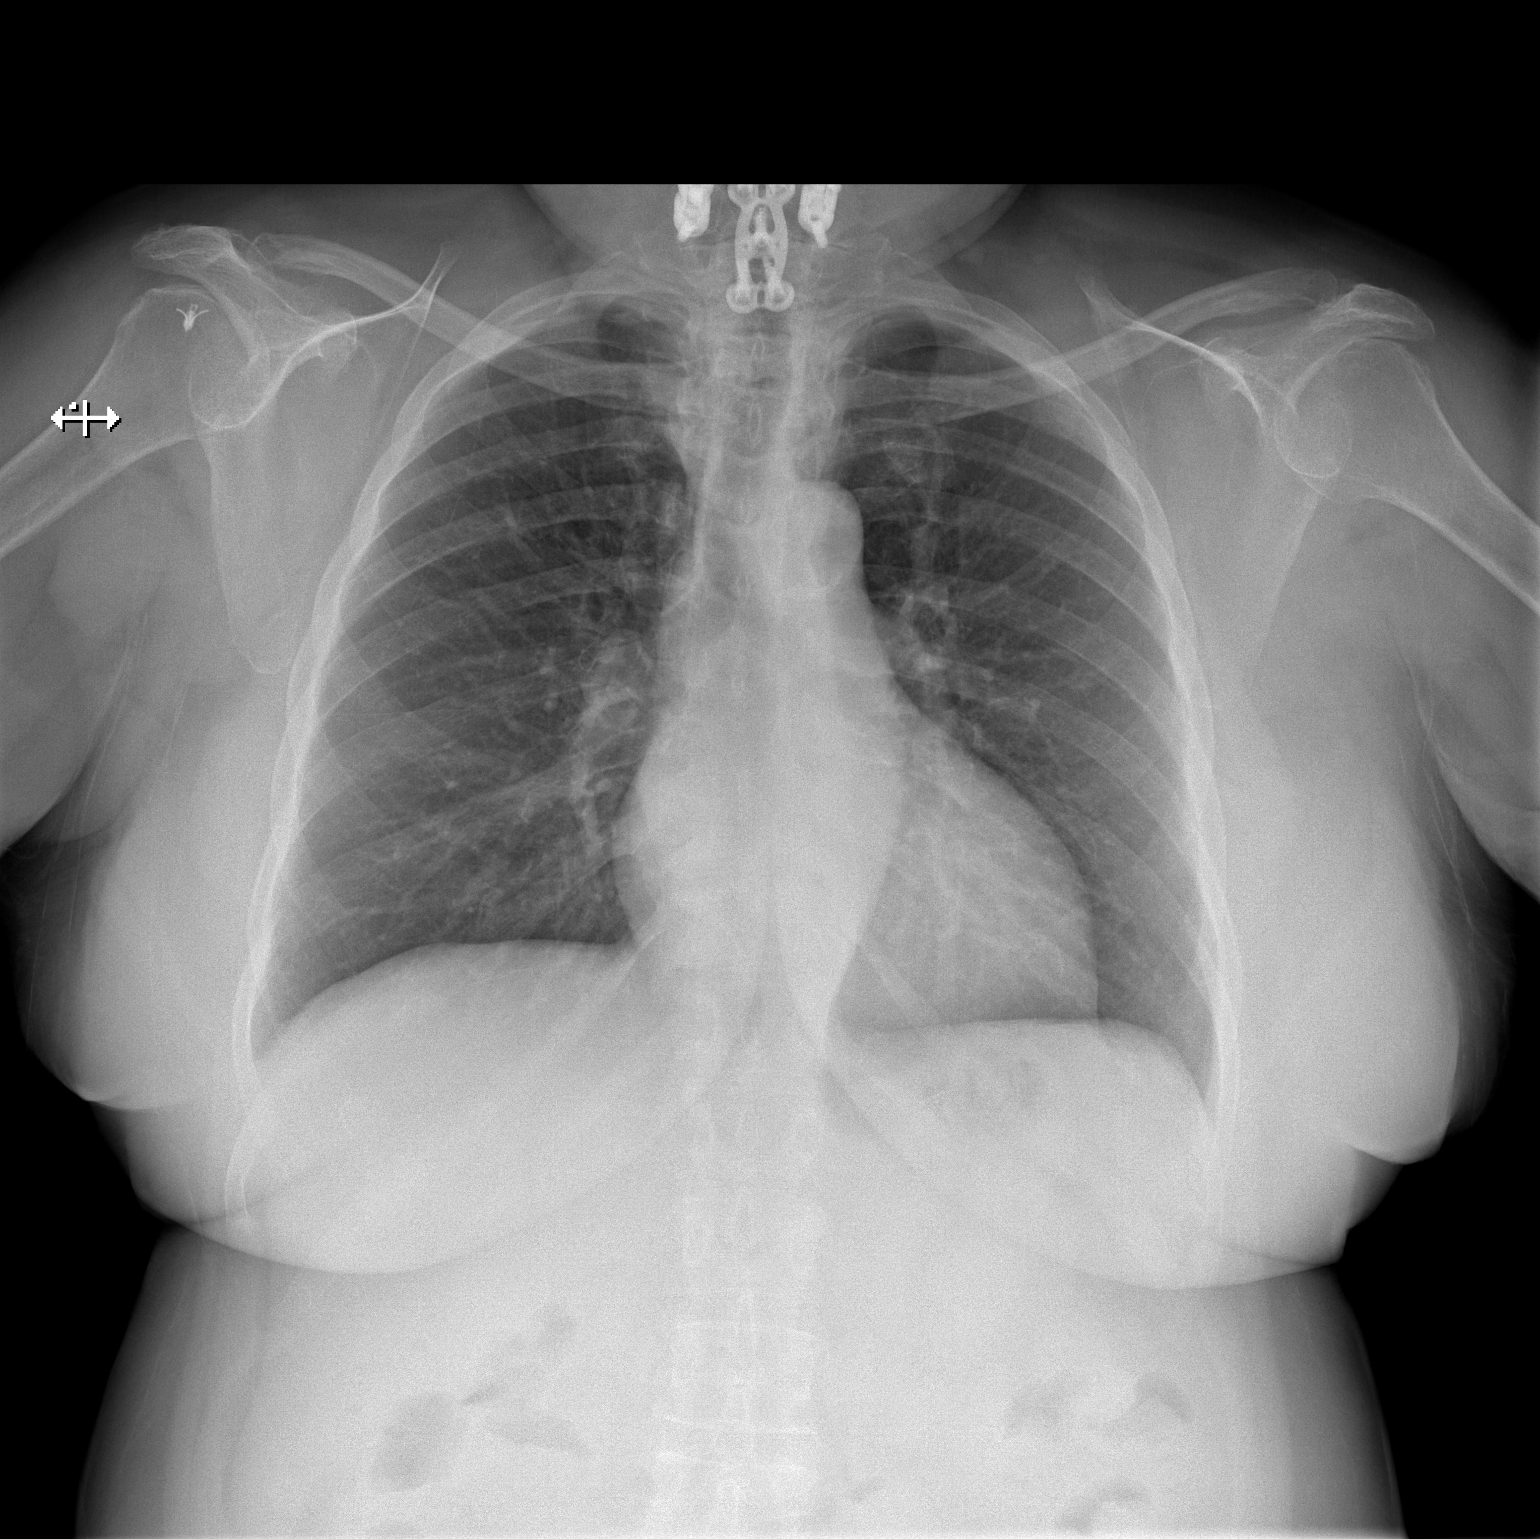

[w chest lat]
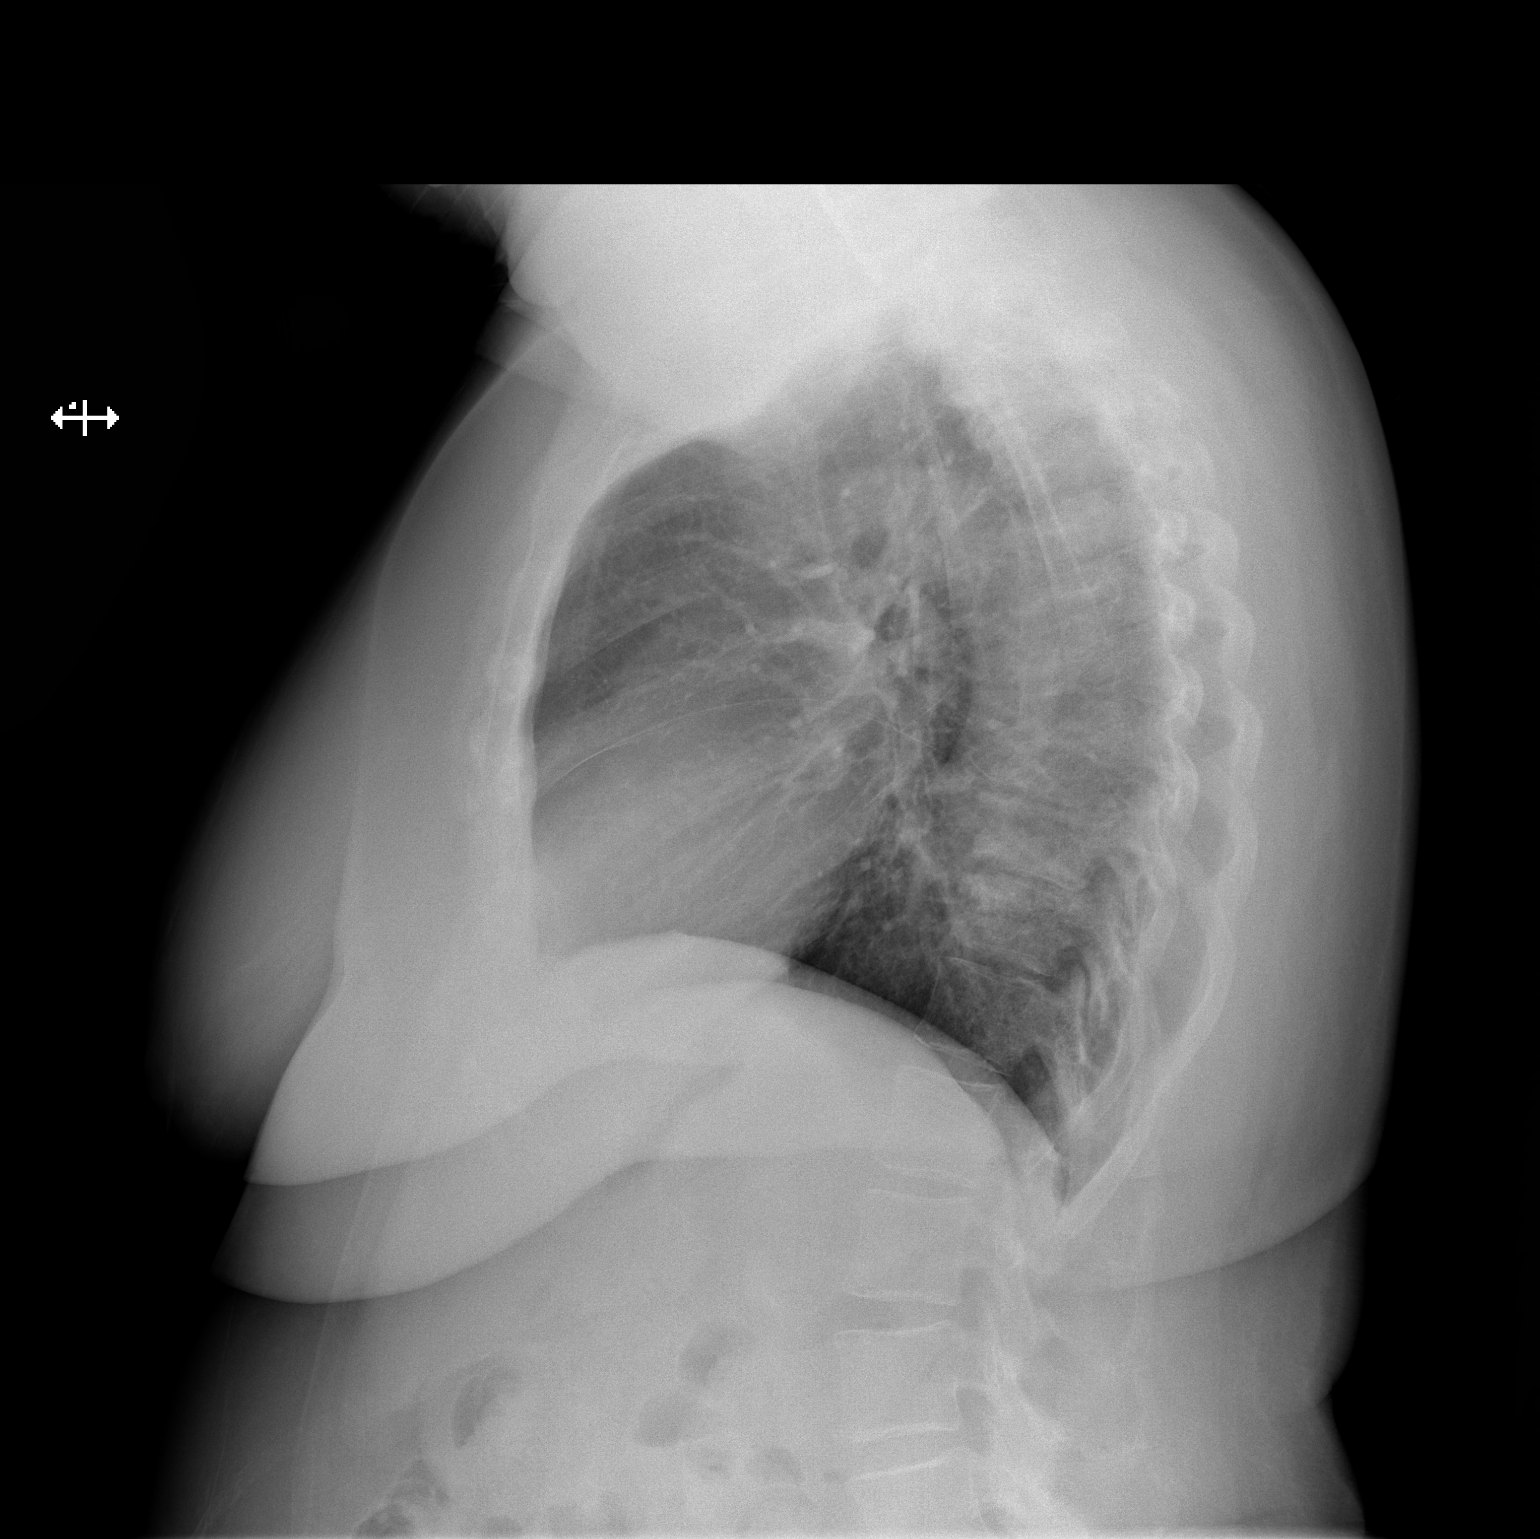

[2 of 2 positions shown; findings below may reference images not displayed]

FINDINGS: The heart size and mediastinal contours are within normal limits.
Both lungs are clear. The visualized skeletal structures are
unremarkable.
IMPRESSION: No acute abnormality of the lungs.

## 2023-07-10 ENCOUNTER — Other Ambulatory Visit: Payer: Self-pay

## 2023-07-11 ENCOUNTER — Other Ambulatory Visit: Payer: Self-pay

## 2023-07-11 ENCOUNTER — Other Ambulatory Visit (HOSPITAL_COMMUNITY): Payer: Self-pay

## 2023-07-15 ENCOUNTER — Other Ambulatory Visit (HOSPITAL_COMMUNITY): Payer: Self-pay

## 2023-07-15 ENCOUNTER — Other Ambulatory Visit: Payer: Self-pay

## 2023-07-16 ENCOUNTER — Other Ambulatory Visit: Payer: Self-pay

## 2023-07-18 ENCOUNTER — Other Ambulatory Visit: Payer: Self-pay

## 2023-07-21 ENCOUNTER — Other Ambulatory Visit: Payer: Self-pay

## 2023-07-21 DIAGNOSIS — I10 Essential (primary) hypertension: Secondary | ICD-10-CM | POA: Diagnosis not present

## 2023-07-21 DIAGNOSIS — E1165 Type 2 diabetes mellitus with hyperglycemia: Secondary | ICD-10-CM | POA: Diagnosis not present

## 2023-07-21 DIAGNOSIS — E785 Hyperlipidemia, unspecified: Secondary | ICD-10-CM | POA: Diagnosis not present

## 2023-07-22 ENCOUNTER — Other Ambulatory Visit (HOSPITAL_COMMUNITY): Payer: Self-pay

## 2023-07-22 ENCOUNTER — Other Ambulatory Visit: Payer: Self-pay

## 2023-07-23 ENCOUNTER — Other Ambulatory Visit: Payer: Self-pay

## 2023-07-23 ENCOUNTER — Other Ambulatory Visit (HOSPITAL_COMMUNITY): Payer: Self-pay

## 2023-07-23 MED ORDER — DONEPEZIL HCL 10 MG PO TABS
10.0000 mg | ORAL_TABLET | Freq: Every day | ORAL | 1 refills | Status: DC
  Filled 2023-07-23 (×2): qty 30, 30d supply, fill #0
  Filled 2023-08-14 – 2023-08-15 (×2): qty 30, 30d supply, fill #1
  Filled 2023-09-16 – 2023-09-26 (×3): qty 30, 30d supply, fill #2
  Filled 2023-10-22 – 2023-10-27 (×2): qty 30, 30d supply, fill #3
  Filled 2023-11-19 – 2023-11-27 (×2): qty 30, 30d supply, fill #4
  Filled 2023-12-29: qty 30, 30d supply, fill #5

## 2023-07-24 ENCOUNTER — Other Ambulatory Visit: Payer: Self-pay

## 2023-07-25 ENCOUNTER — Other Ambulatory Visit: Payer: Self-pay

## 2023-07-28 ENCOUNTER — Other Ambulatory Visit: Payer: Self-pay

## 2023-08-04 ENCOUNTER — Other Ambulatory Visit: Payer: Self-pay

## 2023-08-04 ENCOUNTER — Other Ambulatory Visit (HOSPITAL_COMMUNITY): Payer: Self-pay

## 2023-08-04 DIAGNOSIS — M545 Low back pain, unspecified: Secondary | ICD-10-CM | POA: Diagnosis not present

## 2023-08-04 DIAGNOSIS — I129 Hypertensive chronic kidney disease with stage 1 through stage 4 chronic kidney disease, or unspecified chronic kidney disease: Secondary | ICD-10-CM | POA: Diagnosis not present

## 2023-08-04 DIAGNOSIS — M5417 Radiculopathy, lumbosacral region: Secondary | ICD-10-CM | POA: Diagnosis not present

## 2023-08-04 DIAGNOSIS — N184 Chronic kidney disease, stage 4 (severe): Secondary | ICD-10-CM | POA: Diagnosis not present

## 2023-08-04 DIAGNOSIS — E1122 Type 2 diabetes mellitus with diabetic chronic kidney disease: Secondary | ICD-10-CM | POA: Diagnosis not present

## 2023-08-04 MED ORDER — PREDNISONE 5 MG (21) PO TBPK
ORAL_TABLET | ORAL | 0 refills | Status: DC
  Filled 2023-08-04 (×2): qty 21, 6d supply, fill #0

## 2023-08-04 MED ORDER — HYDROCODONE-ACETAMINOPHEN 5-325 MG PO TABS
0.5000 | ORAL_TABLET | ORAL | 0 refills | Status: DC
  Filled 2023-08-04 (×2): qty 30, 10d supply, fill #0

## 2023-08-05 ENCOUNTER — Other Ambulatory Visit (HOSPITAL_COMMUNITY): Payer: Self-pay

## 2023-08-11 DIAGNOSIS — I10 Essential (primary) hypertension: Secondary | ICD-10-CM | POA: Diagnosis not present

## 2023-08-11 DIAGNOSIS — E1165 Type 2 diabetes mellitus with hyperglycemia: Secondary | ICD-10-CM | POA: Diagnosis not present

## 2023-08-11 DIAGNOSIS — M533 Sacrococcygeal disorders, not elsewhere classified: Secondary | ICD-10-CM | POA: Diagnosis not present

## 2023-08-11 DIAGNOSIS — M545 Low back pain, unspecified: Secondary | ICD-10-CM | POA: Diagnosis not present

## 2023-08-11 DIAGNOSIS — M150 Primary generalized (osteo)arthritis: Secondary | ICD-10-CM | POA: Diagnosis not present

## 2023-08-11 DIAGNOSIS — E1169 Type 2 diabetes mellitus with other specified complication: Secondary | ICD-10-CM | POA: Diagnosis not present

## 2023-08-12 DIAGNOSIS — M7061 Trochanteric bursitis, right hip: Secondary | ICD-10-CM | POA: Diagnosis not present

## 2023-08-12 DIAGNOSIS — M5416 Radiculopathy, lumbar region: Secondary | ICD-10-CM | POA: Diagnosis not present

## 2023-08-14 ENCOUNTER — Other Ambulatory Visit: Payer: Self-pay

## 2023-08-15 ENCOUNTER — Other Ambulatory Visit: Payer: Self-pay

## 2023-08-18 ENCOUNTER — Other Ambulatory Visit (HOSPITAL_COMMUNITY): Payer: Self-pay

## 2023-08-18 ENCOUNTER — Other Ambulatory Visit: Payer: Self-pay

## 2023-08-19 ENCOUNTER — Telehealth: Payer: Self-pay | Admitting: Pharmacist

## 2023-08-19 ENCOUNTER — Other Ambulatory Visit (HOSPITAL_COMMUNITY): Payer: Self-pay

## 2023-08-19 DIAGNOSIS — M545 Low back pain, unspecified: Secondary | ICD-10-CM | POA: Diagnosis not present

## 2023-08-19 NOTE — Telephone Encounter (Signed)
 Attempted to call patient to f/u on her BP and missed apt. Mailbox full

## 2023-08-20 DIAGNOSIS — E1122 Type 2 diabetes mellitus with diabetic chronic kidney disease: Secondary | ICD-10-CM | POA: Diagnosis not present

## 2023-08-20 DIAGNOSIS — D631 Anemia in chronic kidney disease: Secondary | ICD-10-CM | POA: Diagnosis not present

## 2023-08-20 DIAGNOSIS — N184 Chronic kidney disease, stage 4 (severe): Secondary | ICD-10-CM | POA: Diagnosis not present

## 2023-08-20 DIAGNOSIS — C641 Malignant neoplasm of right kidney, except renal pelvis: Secondary | ICD-10-CM | POA: Diagnosis not present

## 2023-08-20 DIAGNOSIS — C642 Malignant neoplasm of left kidney, except renal pelvis: Secondary | ICD-10-CM | POA: Diagnosis not present

## 2023-08-20 DIAGNOSIS — R413 Other amnesia: Secondary | ICD-10-CM | POA: Diagnosis not present

## 2023-08-20 DIAGNOSIS — I129 Hypertensive chronic kidney disease with stage 1 through stage 4 chronic kidney disease, or unspecified chronic kidney disease: Secondary | ICD-10-CM | POA: Diagnosis not present

## 2023-08-20 DIAGNOSIS — N2581 Secondary hyperparathyroidism of renal origin: Secondary | ICD-10-CM | POA: Diagnosis not present

## 2023-08-20 DIAGNOSIS — N189 Chronic kidney disease, unspecified: Secondary | ICD-10-CM | POA: Diagnosis not present

## 2023-08-26 ENCOUNTER — Other Ambulatory Visit (HOSPITAL_COMMUNITY): Payer: Self-pay

## 2023-09-02 DIAGNOSIS — M5416 Radiculopathy, lumbar region: Secondary | ICD-10-CM | POA: Diagnosis not present

## 2023-09-05 ENCOUNTER — Other Ambulatory Visit (HOSPITAL_COMMUNITY): Payer: Self-pay

## 2023-09-05 ENCOUNTER — Other Ambulatory Visit: Payer: Self-pay

## 2023-09-05 MED ORDER — FUROSEMIDE 20 MG PO TABS
10.0000 mg | ORAL_TABLET | Freq: Every day | ORAL | 0 refills | Status: DC
  Filled 2023-09-19 – 2023-09-26 (×3): qty 45, 90d supply, fill #0

## 2023-09-05 MED ORDER — DICYCLOMINE HCL 10 MG PO CAPS
10.0000 mg | ORAL_CAPSULE | Freq: Every day | ORAL | 1 refills | Status: DC
  Filled 2023-09-19 – 2023-09-23 (×2): qty 60, 30d supply, fill #0
  Filled 2023-09-26: qty 30, 30d supply, fill #0
  Filled 2023-09-26 (×2): qty 60, 30d supply, fill #0

## 2023-09-08 ENCOUNTER — Other Ambulatory Visit: Payer: Self-pay

## 2023-09-08 ENCOUNTER — Other Ambulatory Visit (HOSPITAL_COMMUNITY): Payer: Self-pay

## 2023-09-08 DIAGNOSIS — I129 Hypertensive chronic kidney disease with stage 1 through stage 4 chronic kidney disease, or unspecified chronic kidney disease: Secondary | ICD-10-CM | POA: Diagnosis not present

## 2023-09-08 DIAGNOSIS — M545 Low back pain, unspecified: Secondary | ICD-10-CM | POA: Diagnosis not present

## 2023-09-08 DIAGNOSIS — E1122 Type 2 diabetes mellitus with diabetic chronic kidney disease: Secondary | ICD-10-CM | POA: Diagnosis not present

## 2023-09-08 DIAGNOSIS — M150 Primary generalized (osteo)arthritis: Secondary | ICD-10-CM | POA: Diagnosis not present

## 2023-09-08 DIAGNOSIS — N184 Chronic kidney disease, stage 4 (severe): Secondary | ICD-10-CM | POA: Diagnosis not present

## 2023-09-08 DIAGNOSIS — E1165 Type 2 diabetes mellitus with hyperglycemia: Secondary | ICD-10-CM | POA: Diagnosis not present

## 2023-09-08 DIAGNOSIS — M48061 Spinal stenosis, lumbar region without neurogenic claudication: Secondary | ICD-10-CM | POA: Diagnosis not present

## 2023-09-08 MED ORDER — HYDROCODONE-ACETAMINOPHEN 5-325 MG PO TABS
0.5000 | ORAL_TABLET | ORAL | 0 refills | Status: DC
  Filled 2023-09-08 (×2): qty 30, 10d supply, fill #0

## 2023-09-09 ENCOUNTER — Other Ambulatory Visit: Payer: Self-pay

## 2023-09-16 ENCOUNTER — Other Ambulatory Visit: Payer: Self-pay

## 2023-09-16 ENCOUNTER — Other Ambulatory Visit: Payer: Self-pay | Admitting: Cardiology

## 2023-09-16 DIAGNOSIS — I10 Essential (primary) hypertension: Secondary | ICD-10-CM

## 2023-09-17 ENCOUNTER — Other Ambulatory Visit: Payer: Self-pay | Admitting: Cardiology

## 2023-09-17 ENCOUNTER — Other Ambulatory Visit: Payer: Self-pay

## 2023-09-17 DIAGNOSIS — I10 Essential (primary) hypertension: Secondary | ICD-10-CM

## 2023-09-17 MED ORDER — AMLODIPINE BESYLATE 10 MG PO TABS
10.0000 mg | ORAL_TABLET | Freq: Every day | ORAL | 0 refills | Status: DC
  Filled 2023-09-17: qty 30, 30d supply, fill #0

## 2023-09-18 ENCOUNTER — Other Ambulatory Visit: Payer: Self-pay

## 2023-09-18 ENCOUNTER — Other Ambulatory Visit (HOSPITAL_COMMUNITY): Payer: Self-pay

## 2023-09-18 DIAGNOSIS — I10 Essential (primary) hypertension: Secondary | ICD-10-CM

## 2023-09-18 MED ORDER — AMLODIPINE BESYLATE 10 MG PO TABS
10.0000 mg | ORAL_TABLET | Freq: Every day | ORAL | 3 refills | Status: AC
  Filled 2023-09-18: qty 90, 90d supply, fill #0
  Filled 2023-09-18 – 2023-09-26 (×3): qty 30, 30d supply, fill #0
  Filled 2023-10-22 – 2023-10-27 (×2): qty 30, 30d supply, fill #1
  Filled 2023-11-19 – 2023-11-27 (×2): qty 30, 30d supply, fill #2
  Filled 2023-12-29: qty 30, 30d supply, fill #3
  Filled 2024-01-21 – 2024-01-22 (×2): qty 30, 30d supply, fill #4
  Filled 2024-02-24: qty 30, 30d supply, fill #5
  Filled 2024-03-25: qty 30, 30d supply, fill #6

## 2023-09-18 MED ORDER — ATORVASTATIN CALCIUM 10 MG PO TABS
10.0000 mg | ORAL_TABLET | Freq: Every morning | ORAL | 3 refills | Status: DC
  Filled 2023-09-18: qty 90, 90d supply, fill #0
  Filled 2023-09-18 – 2023-09-26 (×4): qty 30, 30d supply, fill #0
  Filled 2023-10-22 – 2023-10-27 (×2): qty 30, 30d supply, fill #1
  Filled 2023-11-19 – 2023-11-27 (×2): qty 30, 30d supply, fill #2

## 2023-09-18 NOTE — Progress Notes (Signed)
 Meds reordered to pharmacy of choice per Dr. Shlomo.

## 2023-09-19 ENCOUNTER — Other Ambulatory Visit: Payer: Self-pay

## 2023-09-22 ENCOUNTER — Other Ambulatory Visit: Payer: Self-pay

## 2023-09-22 ENCOUNTER — Other Ambulatory Visit (HOSPITAL_COMMUNITY): Payer: Self-pay

## 2023-09-22 DIAGNOSIS — M150 Primary generalized (osteo)arthritis: Secondary | ICD-10-CM | POA: Diagnosis not present

## 2023-09-22 DIAGNOSIS — M48061 Spinal stenosis, lumbar region without neurogenic claudication: Secondary | ICD-10-CM | POA: Diagnosis not present

## 2023-09-22 DIAGNOSIS — M545 Low back pain, unspecified: Secondary | ICD-10-CM | POA: Diagnosis not present

## 2023-09-22 DIAGNOSIS — E1169 Type 2 diabetes mellitus with other specified complication: Secondary | ICD-10-CM | POA: Diagnosis not present

## 2023-09-22 MED ORDER — DEXCOM G7 SENSOR MISC
5 refills | Status: DC
  Filled 2023-09-22 – 2023-10-06 (×2): qty 3, 30d supply, fill #0

## 2023-09-22 MED ORDER — DEXCOM G7 SENSOR MISC
5 refills | Status: DC
  Filled 2023-09-22: qty 2, 20d supply, fill #0
  Filled 2023-09-22: qty 3, 30d supply, fill #0
  Filled 2023-09-22: qty 1, 10d supply, fill #0
  Filled 2023-10-23 – 2023-10-29 (×3): qty 3, 30d supply, fill #1

## 2023-09-23 ENCOUNTER — Other Ambulatory Visit: Payer: Self-pay

## 2023-09-23 ENCOUNTER — Other Ambulatory Visit (HOSPITAL_COMMUNITY): Payer: Self-pay

## 2023-09-24 ENCOUNTER — Other Ambulatory Visit: Payer: Self-pay

## 2023-09-24 DIAGNOSIS — M5416 Radiculopathy, lumbar region: Secondary | ICD-10-CM | POA: Diagnosis not present

## 2023-09-25 ENCOUNTER — Other Ambulatory Visit (HOSPITAL_COMMUNITY): Payer: Self-pay

## 2023-09-25 ENCOUNTER — Other Ambulatory Visit: Payer: Self-pay

## 2023-09-26 ENCOUNTER — Other Ambulatory Visit: Payer: Self-pay

## 2023-09-29 ENCOUNTER — Other Ambulatory Visit: Payer: Self-pay

## 2023-10-01 ENCOUNTER — Other Ambulatory Visit: Payer: Self-pay

## 2023-10-02 ENCOUNTER — Emergency Department (HOSPITAL_COMMUNITY)

## 2023-10-02 ENCOUNTER — Other Ambulatory Visit: Payer: Self-pay

## 2023-10-02 ENCOUNTER — Observation Stay (HOSPITAL_COMMUNITY)
Admission: EM | Admit: 2023-10-02 | Discharge: 2023-10-05 | Disposition: A | Discharge status: Home / Self Care | Attending: Internal Medicine | Admitting: Internal Medicine

## 2023-10-02 DIAGNOSIS — Z85528 Personal history of other malignant neoplasm of kidney: Secondary | ICD-10-CM | POA: Insufficient documentation

## 2023-10-02 DIAGNOSIS — F0390 Unspecified dementia without behavioral disturbance: Secondary | ICD-10-CM | POA: Diagnosis not present

## 2023-10-02 DIAGNOSIS — R079 Chest pain, unspecified: Secondary | ICD-10-CM | POA: Diagnosis not present

## 2023-10-02 DIAGNOSIS — Z794 Long term (current) use of insulin: Secondary | ICD-10-CM | POA: Diagnosis not present

## 2023-10-02 DIAGNOSIS — E1169 Type 2 diabetes mellitus with other specified complication: Secondary | ICD-10-CM | POA: Diagnosis present

## 2023-10-02 DIAGNOSIS — R0789 Other chest pain: Secondary | ICD-10-CM | POA: Insufficient documentation

## 2023-10-02 DIAGNOSIS — E785 Hyperlipidemia, unspecified: Secondary | ICD-10-CM | POA: Insufficient documentation

## 2023-10-02 DIAGNOSIS — E1159 Type 2 diabetes mellitus with other circulatory complications: Secondary | ICD-10-CM | POA: Diagnosis present

## 2023-10-02 DIAGNOSIS — E109 Type 1 diabetes mellitus without complications: Secondary | ICD-10-CM | POA: Insufficient documentation

## 2023-10-02 DIAGNOSIS — R109 Unspecified abdominal pain: Secondary | ICD-10-CM | POA: Diagnosis present

## 2023-10-02 DIAGNOSIS — R29818 Other symptoms and signs involving the nervous system: Secondary | ICD-10-CM | POA: Diagnosis not present

## 2023-10-02 DIAGNOSIS — R9089 Other abnormal findings on diagnostic imaging of central nervous system: Secondary | ICD-10-CM | POA: Diagnosis not present

## 2023-10-02 DIAGNOSIS — E119 Type 2 diabetes mellitus without complications: Secondary | ICD-10-CM

## 2023-10-02 DIAGNOSIS — Z7982 Long term (current) use of aspirin: Secondary | ICD-10-CM | POA: Diagnosis not present

## 2023-10-02 DIAGNOSIS — R42 Dizziness and giddiness: Secondary | ICD-10-CM | POA: Diagnosis not present

## 2023-10-02 DIAGNOSIS — J45909 Unspecified asthma, uncomplicated: Secondary | ICD-10-CM | POA: Insufficient documentation

## 2023-10-02 DIAGNOSIS — N184 Chronic kidney disease, stage 4 (severe): Secondary | ICD-10-CM | POA: Diagnosis present

## 2023-10-02 DIAGNOSIS — R4182 Altered mental status, unspecified: Secondary | ICD-10-CM | POA: Diagnosis not present

## 2023-10-02 DIAGNOSIS — H538 Other visual disturbances: Secondary | ICD-10-CM | POA: Diagnosis not present

## 2023-10-02 DIAGNOSIS — I1310 Hypertensive heart and chronic kidney disease without heart failure, with stage 1 through stage 4 chronic kidney disease, or unspecified chronic kidney disease: Secondary | ICD-10-CM | POA: Diagnosis not present

## 2023-10-02 DIAGNOSIS — Z471 Aftercare following joint replacement surgery: Secondary | ICD-10-CM | POA: Diagnosis not present

## 2023-10-02 DIAGNOSIS — H532 Diplopia: Secondary | ICD-10-CM

## 2023-10-02 DIAGNOSIS — R519 Headache, unspecified: Secondary | ICD-10-CM | POA: Diagnosis not present

## 2023-10-02 LAB — HEPATIC FUNCTION PANEL
ALT: 20 U/L (ref 0–44)
AST: 22 U/L (ref 15–41)
Albumin: 3.6 g/dL (ref 3.5–5.0)
Alkaline Phosphatase: 68 U/L (ref 38–126)
Bilirubin, Direct: 0.1 mg/dL (ref 0.0–0.2)
Indirect Bilirubin: 0.5 mg/dL (ref 0.3–0.9)
Total Bilirubin: 0.6 mg/dL (ref 0.0–1.2)
Total Protein: 7.3 g/dL (ref 6.5–8.1)

## 2023-10-02 LAB — BASIC METABOLIC PANEL WITH GFR
Anion gap: 9 (ref 5–15)
BUN: 24 mg/dL — ABNORMAL HIGH (ref 8–23)
CO2: 24 mmol/L (ref 22–32)
Calcium: 9.6 mg/dL (ref 8.9–10.3)
Chloride: 105 mmol/L (ref 98–111)
Creatinine, Ser: 1.34 mg/dL — ABNORMAL HIGH (ref 0.44–1.00)
GFR, Estimated: 39 mL/min — ABNORMAL LOW (ref >60)
Glucose, Bld: 139 mg/dL — ABNORMAL HIGH (ref 70–99)
Potassium: 4.1 mmol/L (ref 3.5–5.1)
Sodium: 138 mmol/L (ref 135–145)

## 2023-10-02 LAB — CBC
HCT: 40.8 % (ref 36.0–46.0)
Hemoglobin: 12.8 g/dL (ref 12.0–15.0)
MCH: 31 pg (ref 26.0–34.0)
MCHC: 31.4 g/dL (ref 30.0–36.0)
MCV: 98.8 fL (ref 80.0–100.0)
Platelets: 213 K/uL (ref 150–400)
RBC: 4.13 MIL/uL (ref 3.87–5.11)
RDW: 13.8 % (ref 11.5–15.5)
WBC: 5.9 K/uL (ref 4.0–10.5)
nRBC: 0 % (ref 0.0–0.2)

## 2023-10-02 LAB — URINALYSIS, ROUTINE W REFLEX MICROSCOPIC
Bilirubin Urine: NEGATIVE
Glucose, UA: 150 mg/dL — AB
Hgb urine dipstick: NEGATIVE
Ketones, ur: NEGATIVE mg/dL
Leukocytes,Ua: NEGATIVE
Nitrite: NEGATIVE
Protein, ur: NEGATIVE mg/dL
Specific Gravity, Urine: 1.006 (ref 1.005–1.030)
pH: 8 (ref 5.0–8.0)

## 2023-10-02 LAB — TROPONIN I (HIGH SENSITIVITY)
Troponin I (High Sensitivity): 4 ng/L (ref <18)
Troponin I (High Sensitivity): 5 ng/L (ref <18)

## 2023-10-02 LAB — AMMONIA: Ammonia: 26 umol/L (ref 9–35)

## 2023-10-02 LAB — LACTIC ACID, PLASMA
Lactic Acid, Venous: 1.5 mmol/L (ref 0.5–1.9)
Lactic Acid, Venous: 1.5 mmol/L (ref 0.5–1.9)

## 2023-10-02 LAB — GLUCOSE, CAPILLARY: Glucose-Capillary: 194 mg/dL — ABNORMAL HIGH (ref 70–99)

## 2023-10-02 LAB — RESP PANEL BY RT-PCR (RSV, FLU A&B, COVID)  RVPGX2
Influenza A by PCR: NEGATIVE
Influenza B by PCR: NEGATIVE
Resp Syncytial Virus by PCR: NEGATIVE
SARS Coronavirus 2 by RT PCR: NEGATIVE

## 2023-10-02 LAB — LIPASE, BLOOD: Lipase: 47 U/L (ref 11–51)

## 2023-10-02 MED ORDER — ATORVASTATIN CALCIUM 10 MG PO TABS
10.0000 mg | ORAL_TABLET | Freq: Every morning | ORAL | Status: DC
  Administered 2023-10-03 – 2023-10-05 (×3): 10 mg via ORAL
  Filled 2023-10-02 (×3): qty 1

## 2023-10-02 MED ORDER — DIAZEPAM 5 MG/ML IJ SOLN
2.5000 mg | Freq: Once | INTRAMUSCULAR | Status: AC
  Administered 2023-10-02: 2.5 mg via INTRAVENOUS
  Filled 2023-10-02: qty 2

## 2023-10-02 MED ORDER — ACETAMINOPHEN 500 MG PO TABS
1000.0000 mg | ORAL_TABLET | Freq: Four times a day (QID) | ORAL | Status: DC | PRN
  Administered 2023-10-03: 1000 mg via ORAL
  Filled 2023-10-02: qty 2

## 2023-10-02 MED ORDER — ONDANSETRON HCL 4 MG/2ML IJ SOLN: 4.0000 mg | Freq: Four times a day (QID) | INTRAMUSCULAR | Status: DC | PRN

## 2023-10-02 MED ORDER — LACTATED RINGERS IV BOLUS
500.0000 mL | Freq: Once | INTRAVENOUS | Status: AC
  Administered 2023-10-02: 500 mL via INTRAVENOUS

## 2023-10-02 MED ORDER — ONDANSETRON HCL 4 MG/2ML IJ SOLN
4.0000 mg | Freq: Once | INTRAMUSCULAR | Status: AC
  Administered 2023-10-02: 4 mg via INTRAVENOUS
  Filled 2023-10-02: qty 2

## 2023-10-02 MED ORDER — HYDRALAZINE HCL 50 MG PO TABS
50.0000 mg | ORAL_TABLET | Freq: Two times a day (BID) | ORAL | Status: DC
  Administered 2023-10-03 – 2023-10-05 (×5): 50 mg via ORAL
  Filled 2023-10-02 (×5): qty 1

## 2023-10-02 MED ORDER — ACETAMINOPHEN 650 MG RE SUPP: 650.0000 mg | Freq: Four times a day (QID) | RECTAL | Status: DC | PRN

## 2023-10-02 MED ORDER — MECLIZINE HCL 25 MG PO TABS
25.0000 mg | ORAL_TABLET | Freq: Once | ORAL | Status: AC
  Administered 2023-10-02: 25 mg via ORAL
  Filled 2023-10-02: qty 1

## 2023-10-02 MED ORDER — CARVEDILOL 6.25 MG PO TABS: 6.2500 mg | ORAL_TABLET | Freq: Two times a day (BID) | ORAL | Status: DC

## 2023-10-02 MED ORDER — ONDANSETRON HCL 4 MG PO TABS: 4.0000 mg | ORAL_TABLET | Freq: Four times a day (QID) | ORAL | Status: DC | PRN

## 2023-10-02 MED ORDER — GABAPENTIN 300 MG PO CAPS
600.0000 mg | ORAL_CAPSULE | Freq: Every day | ORAL | Status: DC
  Administered 2023-10-02 – 2023-10-04 (×3): 600 mg via ORAL
  Filled 2023-10-02 (×3): qty 2

## 2023-10-02 MED ORDER — INSULIN ASPART 100 UNIT/ML IJ SOLN
0.0000 [IU] | Freq: Three times a day (TID) | INTRAMUSCULAR | Status: DC
  Administered 2023-10-03 (×2): 2 [IU] via SUBCUTANEOUS
  Administered 2023-10-04: 3 [IU] via SUBCUTANEOUS
  Administered 2023-10-04 – 2023-10-05 (×3): 2 [IU] via SUBCUTANEOUS
  Administered 2023-10-05: 3 [IU] via SUBCUTANEOUS
  Administered 2023-10-05: 5 [IU] via SUBCUTANEOUS

## 2023-10-02 MED ORDER — MECLIZINE HCL 25 MG PO TABS
25.0000 mg | ORAL_TABLET | Freq: Three times a day (TID) | ORAL | Status: DC
  Administered 2023-10-02 – 2023-10-03 (×2): 25 mg via ORAL
  Filled 2023-10-02 (×2): qty 1

## 2023-10-02 MED ORDER — DONEPEZIL HCL 10 MG PO TABS
10.0000 mg | ORAL_TABLET | Freq: Every day | ORAL | Status: DC
  Administered 2023-10-03 – 2023-10-04 (×3): 10 mg via ORAL
  Filled 2023-10-02 (×3): qty 1

## 2023-10-02 MED ORDER — GABAPENTIN 300 MG PO CAPS
300.0000 mg | ORAL_CAPSULE | Freq: Every day | ORAL | Status: DC
  Administered 2023-10-03 – 2023-10-05 (×3): 300 mg via ORAL
  Filled 2023-10-02 (×3): qty 1

## 2023-10-02 MED ORDER — AMLODIPINE BESYLATE 10 MG PO TABS
10.0000 mg | ORAL_TABLET | Freq: Every day | ORAL | Status: DC
  Administered 2023-10-03 – 2023-10-05 (×3): 10 mg via ORAL
  Filled 2023-10-02 (×3): qty 1

## 2023-10-02 MED ORDER — HEPARIN SODIUM (PORCINE) 5000 UNIT/ML IJ SOLN
5000.0000 [IU] | Freq: Three times a day (TID) | INTRAMUSCULAR | Status: DC
  Administered 2023-10-03 – 2023-10-05 (×8): 5000 [IU] via SUBCUTANEOUS
  Filled 2023-10-02 (×8): qty 1

## 2023-10-02 MED ORDER — SENNOSIDES-DOCUSATE SODIUM 8.6-50 MG PO TABS: 1.0000 | ORAL_TABLET | Freq: Every evening | ORAL | Status: DC | PRN

## 2023-10-02 NOTE — ED Provider Notes (Signed)
 [place] EMERGENCY DEPARTMENT AT [facility] Provider Note   CSN: [id number] Arrival date & time: 10/02/23  9052   Patient presents with: Chest Pain, Abdominal Pain, and Headache   [name] is a 83 y.o. female.  -Over the month, progressive dizziness, chest pain, weakness, abdominal pain -No new symptoms in last few days, just progressive -Spinning sensation of dizziness -Frequent falls but catches herself, no head injury -Intermittent chest pain -Abdominal pain with nausea, no vomiting, decreased appetite -Increased urinary frequency, no dysuria or hematuria  -Has confusion, memory issues at baseline -Per friend who is accompanying her today, patient overall at baseline, no acute changes   PMH: RCC s/p R nephrectomy, partial L nephrectomy  Memory issues HTN    Chest Pain Associated symptoms: abdominal pain and headache   Abdominal Pain Associated symptoms: chest pain   Headache Associated symptoms: abdominal pain       Prior to Admission medications   Medication Sig Start Date End Date Taking? Authorizing Provider  acetaminophen  (TYLENOL  8 HOUR) 650 MG CR tablet Take 1 tablet (650 mg total) by mouth every 8 (eight) hours as needed for pain. Patient taking differently: Take 650-1,300 mg by mouth every 8 (eight) hours as needed for pain. 02/18/20   Aberman, Caroline C, PA-C  albuterol  (PROVENTIL ,VENTOLIN ) 90 MCG/ACT inhaler Inhale 2 puffs into the lungs every 4 (four) hours as needed for wheezing or shortness of breath.     [provider]  amLODipine  (NORVASC ) 10 MG tablet Take 1 tablet (10 mg total) by mouth daily. 09/18/23   Bihari Wilbert SAUNDERS, MD  aspirin  EC 81 MG tablet Take 81 mg by mouth at bedtime.    [provider]  atorvastatin  (LIPITOR) 10 MG tablet Take 1 tablet (10 mg total) by mouth every morning. 09/18/23   Bihari Wilbert SAUNDERS, MD  carvedilol  (COREG ) 6.25 MG tablet Take 1 tablet (6.25 mg total) by mouth 2 (two) times daily.  11/12/22   Bihari Wilbert SAUNDERS, MD  cholecalciferol  (VITAMIN D ) 1000 units tablet Take 2,000 Units by mouth daily.    [provider]  Continuous Glucose Sensor (DEXCOM G7 SENSOR) MISC Use as directed to monitor blood glucose. Change every 10 days. 09/22/23     Continuous Glucose Sensor (DEXCOM G7 SENSOR) MISC Use to check blood sugar as directed. Change every 10 days 09/22/23   Roy Harlene HERO, NP  dicyclomine  (BENTYL ) 10 MG capsule Take 1 capsule (10 mg total) by mouth 2 (two) times daily before supper and at bedtime 06/20/23     dicyclomine  (BENTYL ) 10 MG capsule Take 1 capsule (10 mg total) by mouth  30 minutes before supper and at bedtime. 09/05/23     donepezil  (ARICEPT ) 10 MG tablet Take 1 tablet (10 mg total) by mouth at bedtime. Must be seen for further refills. Call [phone]. Patient not taking: Reported on 06/02/2023 12/27/20   Millikan, Megan, NP  donepezil  (ARICEPT ) 10 MG tablet Take 1 tablet (10 mg total) by mouth daily. 07/23/23     furosemide  (LASIX ) 20 MG tablet Take 0.5 tablets (10 mg total) by mouth daily. 11/12/22   Bihari Wilbert SAUNDERS, MD  furosemide  (LASIX ) 20 MG tablet Take 0.5 tablets (10 mg total) by mouth daily for swelling and legs. 06/23/23     furosemide  (LASIX ) 20 MG tablet Take 0.5 tablets (10 mg total) by mouth daily for swelling and legs. 09/05/23     gabapentin  (NEURONTIN ) 300 MG capsule Take 1 capsule (300 mg total) by  mouth in the morning AND 2 capsules (600 mg total) Nightly. 02/11/23     gabapentin  (NEURONTIN ) 300 MG capsule Take 1 capsule (300 mg total) by mouth every morning AND 2 capsules (600 mg total) at bedtime. Patient not taking: Reported on 06/02/2023 02/11/23     Glucagon  (BAQSIMI  TWO PACK) 3 MG/DOSE POWD Use as needed nasally for severe hypoglycemia 05/01/23     hydrALAZINE  (APRESOLINE ) 50 MG tablet Take 1 tablet (50 mg total) by mouth 2 (two) times daily. 01/13/23   Shlomo Wilbert SAUNDERS, MD  HYDROcodone -acetaminophen  (NORCO/VICODIN) 5-325 MG tablet Take 0.5 tablets  by mouth every 4-6 hours as needed for pain 09/08/23     insulin  aspart (NOVOLOG  FLEXPEN) 100 UNIT/ML FlexPen Inject 1-2 Units into the skin 2 (two) times daily before lunch and supper. Patient taking differently: Inject 1-2 Units into the skin 2 (two) times daily as needed for high blood sugar. 03/03/23     insulin  degludec (TRESIBA  FLEXTOUCH) 200 UNIT/ML FlexTouch Pen Inject 70 Units into the skin daily. Patient taking differently: Inject 35 Units into the skin daily. 02/18/23     meclizine  (ANTIVERT ) 12.5 MG tablet Take 1 tablet (12.5 mg total) by mouth 3 (three) times daily as needed for dizziness. 06/02/23   Armenta Canning, MD  Multiple Vitamins-Minerals (CENTRUM SILVER ADULT 50+ PO) Take 1 tablet by mouth daily.    [provider]  predniSONE  (STERAPRED UNI-PAK 21 TAB) 5 MG (21) TBPK tablet Take as directed on foil pack 08/04/23       Allergies: Sulfa antibiotics    Review of Systems  Cardiovascular:  Positive for chest pain.  Gastrointestinal:  Positive for abdominal pain.  Neurological:  Positive for headaches.    Updated Vital Signs BP (!) 163/72 (BP Location: Left Arm)   Pulse 66   Temp 98.3 F (36.8 C) (Oral)   Resp 17   Ht 4' 11 (1.499 m)   Wt 63 kg   SpO2 98%   BMI 28.07 kg/m   Physical Exam Cardiovascular:     Rate and Rhythm: Normal rate and regular rhythm.     Heart sounds: Normal heart sounds.  Pulmonary:     Effort: Pulmonary effort is normal.     Breath sounds: Normal breath sounds.  Abdominal:     Palpations: Abdomen is soft.     Tenderness: There is abdominal tenderness (global).  Musculoskeletal:     Right lower leg: No edema.  Skin:    General: Skin is warm and dry.  Neurological:     General: No focal deficit present.     Mental Status: She is alert and oriented to person, place, and time.     Cranial Nerves: No cranial nerve deficit.     Comments: Equal and intact strength and sensation of upper and lower extremities.      (all labs  ordered are listed, but only abnormal results are displayed) Labs Reviewed  BASIC METABOLIC PANEL WITH GFR - Abnormal; Notable for the following components:      Result Value   Glucose, Bld 139 (*)    BUN 24 (*)    Creatinine, Ser 1.34 (*)    GFR, Estimated 39 (*)    All other components within normal limits  URINALYSIS, ROUTINE W REFLEX MICROSCOPIC - Abnormal; Notable for the following components:   Color, Urine STRAW (*)    Glucose, UA 150 (*)    All other components within normal limits  RESP PANEL BY RT-PCR (RSV, FLU A&B,  COVID)  RVPGX2  CBC  AMMONIA  LACTIC ACID, PLASMA  LACTIC ACID, PLASMA  HEPATIC FUNCTION PANEL  LIPASE, BLOOD  TROPONIN I (HIGH SENSITIVITY)  TROPONIN I (HIGH SENSITIVITY)    EKG: EKG Interpretation Date/Time:  Thursday October 02 2023 10:29:46 EDT Ventricular Rate:  59 PR Interval:    QRS Duration:  77 QT Interval:  420 QTC Calculation: 416 R Axis:   60  Text Interpretation: Sinus rhythm Low voltage, precordial leads Borderline ST elevation, inferior leads No significant change since last tracing Confirmed by Doretha Folks ([id number]) on 10/02/2023 11:08:58 AM  Radiology: CT Head Wo Contrast Result Date: 10/02/2023 CLINICAL DATA:  Mental status change, unknown cause EXAM: CT HEAD WITHOUT CONTRAST TECHNIQUE: Contiguous axial images were obtained from the base of the skull through the vertex without intravenous contrast. RADIATION DOSE REDUCTION: This exam was performed according to the departmental dose-optimization program which includes automated exposure control, adjustment of the mA and/or kV according to patient size and/or use of iterative reconstruction technique. COMPARISON:  June 01, 2023 FINDINGS: Brain: Proportional prominence of the ventricles and sulci, consistent with diffuse cerebral parenchymal volume loss. The ventricles otherwise maintained midline position without midline shift. Gray-white differentiation is preserved.Scattered  periventricular white matter hypoattenuation, most consistent with changes of mild chronic ischemic microvascular disease.No evidence of acute territorial infarction, extra-axial fluid collection, hemorrhage, or mass lesion. The basilar cisterns are patent without downward herniation. The cerebellar hemispheres and vermis are well formed without mass lesion or focal attenuation abnormality. Vascular: No hyperdense vessel. Calcified atherosclerotic plaque within the cavernous/supraclinoid ICA and intradural vertebral arteries. Skull: Normal. Negative for fracture or focal lesion. Sinuses/Orbits: The paranasal sinuses and mastoids are clear.The globes appear intact. No retrobulbar hematoma. Other: None. IMPRESSION: 1. No acute intracranial abnormality, specifically, no acute hemorrhage, territorial infarction, or intracranial mass. 2. Mild global cerebral volume loss with sequelae of chronic ischemic microvascular disease. Electronically Signed   By: Rogelia Myers M.D.   On: 10/02/2023 11:19   DG Chest 2 View Result Date: 10/02/2023 CLINICAL DATA:  Chest pain EXAM: CHEST - 2 VIEW COMPARISON:  06/01/2023 FINDINGS: Frontal and lateral views of the chest demonstrate an unremarkable cardiac silhouette. No acute airspace disease, effusion, or pneumothorax. No acute bony abnormalities. IMPRESSION: 1. No acute intrathoracic process. Electronically Signed   By: Ozell Daring M.D.   On: 10/02/2023 10:36     Medications Ordered in the ED  lactated ringers  bolus 500 mL (0 mLs Intravenous Stopped 10/02/23 1256)  meclizine  (ANTIVERT ) tablet 25 mg (25 mg Oral Given 10/02/23 1200)  ondansetron  (ZOFRAN ) injection 4 mg (4 mg Intravenous Given 10/02/23 1200)    Medical Decision Making Amount and/or Complexity of Data Reviewed Labs: ordered. Radiology: ordered.  Risk Prescription drug management.   [name] is a 83 y.o. female is here with worsening dizziness, headache, chest pain.     Lab Tests:  CBC  unremarkable  BMP: Cr 1.34 HFP: Unremarkable  Troponin 4>5 Ammonia 26 Quad resp screen negative Lactic acid 1.5>1.5 UA: Unremarkable  Lipase 47  Imaging Studies ordered:  Ct Head: no acute findings, mild global cerebral volume loss CXR: no acute findings   Medicines ordered:  Meclizine  25 Zofran  4 IV LR bolus 500   Plan Patient with chronic dizziness, chest pain, urinary frequency. CT Head unremarkable. Low concern for ACS given EKG with chronic minimal ST elevation, no acute findings and unreamrkable troponins. UA not suggestive of infection. Patient given meclizine , zofran , fluid bolus. Upon re-evaluation, patient reports  dizziness mildly improved, newly reporting seeing double. Will proceed with brain MRI for further assessment.   At sign out, patient care signed out to oncoming ED provider.   Final diagnoses:  None    ED Discharge Orders     None          Diona Perkins, MD 10/02/23 1539    Doretha Folks, MD 10/02/23 [phone]

## 2023-10-02 NOTE — ED Notes (Signed)
 Pt to MRI

## 2023-10-02 NOTE — Hospital Course (Signed)
 [name] is a 83 y.o. female with medical history significant for CKD stage IV, insulin -dependent T2DM, HTN, HLD, vertigo, renal cell carcinoma s/p right nephrectomy, OSA who is admitted with intractable vertigo.  MRI negative for acute CVA.

## 2023-10-02 NOTE — ED Triage Notes (Signed)
 Patient to ED by POV with c/o left side chest pain, lower ABD pain and headache. Patient states she has had this pain for months she denies cardiac HX, takes ASA 81mg  daily.

## 2023-10-02 NOTE — H&P (Signed)
 + History and Physical    [name]:[id number] DOB: [birth year] DOA: 10/02/2023  PCP: Leigh Lung, MD  Patient coming from: Home  I have personally briefly reviewed patient's old medical records in [facility]  Chief Complaint: Dizziness  HPI: [name] is a 83 y.o. female with medical history significant for CKD stage IV, insulin -dependent T2DM, HTN, HLD, vertigo, renal cell carcinoma s/p right nephrectomy, OSA who presented to the ED for evaluation of dizziness.  Patient states that she has a history of vertigo.  She says over the last 2 months she has had intermittent episodes of dizziness/lightheadedness.  She uses meclizine  as needed at home.  She says more recently her dizziness has been persistent, occurring even while at rest.  She says mostly feeling lightheaded but sometimes spinning sensation.  She has had associated headache and nausea without emesis.  She has not passed out.  She has nearly fallen but has been able to catch herself.  She also reports central chest pain.  On further questioning this is reproducible chest wall pain.  She says she lives alone and has recently been lifting heavy objects.  ED Course  Labs/Imaging on admission: I have personally reviewed following labs and imaging studies.  Initial vitals showed BP 158/57, pulse 60, RR 16, temp 97.7 F, SpO2 99% on room air.  Labs showed WBC 5.9, hemoglobin 12.8, platelets 213, sodium 138, potassium 4.1, bicarb 24, BUN 24, creatinine 1.34, serum glucose 139, LFTs within normal limits, lipase 47, troponin 4 > 5, lactic acid 1.5 x 2.  SARS-CoV-2, influenza, RSV PCR negative.  UA negative for UTI.  Ammonia 26.  2 view chest x-ray negative for focal consolidation, edema, effusion.  CT head without contrast negative for acute intracranial abnormality.  Mild global cerebral volume loss with cephalochronic ischemic microvascular disease.  MRI brain without contrast negative for acute intracranial  abnormality.  Age-related cerebral volume loss seen.  Numerous scattered foci of increased T2 signal within the subcortical cerebral white matter noted.  Patient was given 5 1 cc LR, oral meclizine  25 mg, IV Zofran , IV Valium  2.5 mg.  EDP discussed with on-call neurology (Dr. Merrianne) who reviewed the case and felt this was likely peripheral vertigo.  Patient was unable to ambulate due to persistent vertiginous symptoms therefore the hospitalist service consulted to admit.  Review of Systems: All systems reviewed and are negative except as documented in history of present illness above.   Past Medical History:  Diagnosis Date   ACS (acute coronary syndrome) (HCC) 12/27/2010   AKI (acute kidney injury) (HCC) 03/25/2018   Anginal pain (HCC)    Arthritis    Asthma    Diabetes mellitus    Type 1   Dysrhythmia    irregular heart beat   GERD (gastroesophageal reflux disease)    History of urinary tract infection    Hypercholesteremia    Hypertension    left renal ca dx'd 2012 (?)   surg only, left kidney   Nocturia    Reflux    Renal failure (ARF), acute on chronic (HCC)    Shortness of breath dyspnea    pt denies; states can climb stairs w/o difficulty    Sleep apnea    does not use c-pap machine   Tingling in extremities    legs bilat   Tinnitus    Urinary frequency    Urinary incontinence    Vertigo    occurs when lying flat  Past Surgical History:  Procedure Laterality Date   ABDOMINAL HYSTERECTOMY     APPENDECTOMY     CARDIAC CATHETERIZATION N/A 07/28/2014   Procedure: Left Heart Cath and Coronary Angiography;  Surgeon: Gordy Bergamo, MD;  Location: [facility] INVASIVE CV LAB;  Service: Cardiovascular;  Laterality: N/A;   CYSTOSCOPY WITH RETROGRADE PYELOGRAM, URETEROSCOPY AND STENT PLACEMENT Right 02/09/2015   Procedure: CYSTOSCOPY WITH RETROGRADE PYELOGRAM AND URETEROSCOPY ;  Surgeon: Gretel Ferrara, MD;  Location: [facility] ORS;  Service: Urology;  Laterality: Right;   LAPAROSCOPIC  NEPHRECTOMY Right 03/20/2015   Procedure: LAPAROSCOPIC NEPHRECTOMY;  Surgeon: Gretel Ferrara, MD;  Location: [facility] ORS;  Service: Urology;  Laterality: Right;   PERIPHERAL VASCULAR CATHETERIZATION N/A 07/28/2014   Procedure: Aortic Arch Angiography;  Surgeon: Gordy Bergamo, MD;  Location: [facility] INVASIVE CV LAB;  Service: Cardiovascular;  Laterality: N/A;   RENAL MASS EXCISION     SPINE SURGERY      Social History: Social History   Tobacco Use   Smoking status: Never   Smokeless tobacco: Never  Vaping Use   Vaping status: Never Used  Substance Use Topics   Alcohol use: No   Drug use: No   Allergies  Allergen Reactions   Sulfa Antibiotics Itching    Family History  Problem Relation Age of Onset   Hypertension Mother    Coronary artery disease Other    Breast cancer Cousin      Prior to Admission medications   Medication Sig Start Date End Date Taking? Authorizing Provider  acetaminophen  (TYLENOL  8 HOUR) 650 MG CR tablet Take 1 tablet (650 mg total) by mouth every 8 (eight) hours as needed for pain. Patient taking differently: Take 650-1,300 mg by mouth every 8 (eight) hours as needed for pain. 02/18/20   Aberman, Caroline C, PA-C  albuterol  (PROVENTIL ,VENTOLIN ) 90 MCG/ACT inhaler Inhale 2 puffs into the lungs every 4 (four) hours as needed for wheezing or shortness of breath.     [provider]  amLODipine  (NORVASC ) 10 MG tablet Take 1 tablet (10 mg total) by mouth daily. 09/18/23   Shlomo Wilbert SAUNDERS, MD  aspirin  EC 81 MG tablet Take 81 mg by mouth at bedtime.    [provider]  atorvastatin  (LIPITOR) 10 MG tablet Take 1 tablet (10 mg total) by mouth every morning. 09/18/23   Shlomo Wilbert SAUNDERS, MD  carvedilol  (COREG ) 6.25 MG tablet Take 1 tablet (6.25 mg total) by mouth 2 (two) times daily. 11/12/22   Shlomo Wilbert SAUNDERS, MD  cholecalciferol  (VITAMIN D ) 1000 units tablet Take 2,000 Units by mouth daily.    [provider]  Continuous Glucose Sensor (DEXCOM G7 SENSOR) MISC  Use as directed to monitor blood glucose. Change every 10 days. 09/22/23     Continuous Glucose Sensor (DEXCOM G7 SENSOR) MISC Use to check blood sugar as directed. Change every 10 days 09/22/23   Roy Harlene HERO, NP  dicyclomine  (BENTYL ) 10 MG capsule Take 1 capsule (10 mg total) by mouth 2 (two) times daily before supper and at bedtime 06/20/23     dicyclomine  (BENTYL ) 10 MG capsule Take 1 capsule (10 mg total) by mouth  30 minutes before supper and at bedtime. 09/05/23     donepezil  (ARICEPT ) 10 MG tablet Take 1 tablet (10 mg total) by mouth at bedtime. Must be seen for further refills. Call [phone]. Patient not taking: Reported on 06/02/2023 12/27/20   Millikan, Megan, NP  donepezil  (ARICEPT ) 10 MG tablet Take 1 tablet (10 mg total) by mouth  daily. 07/23/23     furosemide  (LASIX ) 20 MG tablet Take 0.5 tablets (10 mg total) by mouth daily. 11/12/22   Shlomo Wilbert SAUNDERS, MD  furosemide  (LASIX ) 20 MG tablet Take 0.5 tablets (10 mg total) by mouth daily for swelling and legs. 06/23/23     furosemide  (LASIX ) 20 MG tablet Take 0.5 tablets (10 mg total) by mouth daily for swelling and legs. 09/05/23     gabapentin  (NEURONTIN ) 300 MG capsule Take 1 capsule (300 mg total) by mouth in the morning AND 2 capsules (600 mg total) Nightly. 02/11/23     gabapentin  (NEURONTIN ) 300 MG capsule Take 1 capsule (300 mg total) by mouth every morning AND 2 capsules (600 mg total) at bedtime. Patient not taking: Reported on 06/02/2023 02/11/23     Glucagon  (BAQSIMI  TWO PACK) 3 MG/DOSE POWD Use as needed nasally for severe hypoglycemia 05/01/23     hydrALAZINE  (APRESOLINE ) 50 MG tablet Take 1 tablet (50 mg total) by mouth 2 (two) times daily. 01/13/23   Shlomo Wilbert SAUNDERS, MD  HYDROcodone -acetaminophen  (NORCO/VICODIN) 5-325 MG tablet Take 0.5 tablets by mouth every 4-6 hours as needed for pain 09/08/23     insulin  aspart (NOVOLOG  FLEXPEN) 100 UNIT/ML FlexPen Inject 1-2 Units into the skin 2 (two) times daily before lunch and  supper. Patient taking differently: Inject 1-2 Units into the skin 2 (two) times daily as needed for high blood sugar. 03/03/23     insulin  degludec (TRESIBA  FLEXTOUCH) 200 UNIT/ML FlexTouch Pen Inject 70 Units into the skin daily. Patient taking differently: Inject 35 Units into the skin daily. 02/18/23     meclizine  (ANTIVERT ) 12.5 MG tablet Take 1 tablet (12.5 mg total) by mouth 3 (three) times daily as needed for dizziness. 06/02/23   Armenta Canning, MD  Multiple Vitamins-Minerals (CENTRUM SILVER ADULT 50+ PO) Take 1 tablet by mouth daily.    [provider]  predniSONE  (STERAPRED UNI-PAK 21 TAB) 5 MG (21) TBPK tablet Take as directed on foil pack 08/04/23       Physical Exam: Vitals:   10/02/23 1445 10/02/23 1600 10/02/23 1700 10/02/23 1846  BP: (!) 163/72 (!) 161/65  (!) 156/54  Pulse: 66 62  69  Resp: 17 15  16   Temp: 98.3 F (36.8 C)  98.1 F (36.7 C) 98.3 F (36.8 C)  TempSrc: Oral   Oral  SpO2: 98% 98%  96%  Weight:      Height:       Constitutional: Resting in bed, NAD, calm Eyes: PERRL, EOMI, lids and conjunctivae normal ENMT: Mucous membranes are moist. Posterior pharynx clear of any exudate or lesions.Normal dentition.  Neck: normal, supple, no masses. Respiratory: clear to auscultation bilaterally, no wheezing, no crackles. Normal respiratory effort. No accessory muscle use.  Cardiovascular: Regular rate and rhythm, no murmurs / rubs / gallops.  Trace bilateral lower extremity edema. 2+ pedal pulses. Abdomen: Soft, no tenderness, no masses palpated. Musculoskeletal: Reproducible chest wall tenderness with palpation.  No clubbing / cyanosis. No joint deformity upper and lower extremities. Good ROM, no contractures. Normal muscle tone.  Skin: no rashes, lesions, ulcers. No induration Neurologic: Sensation intact. Strength 5/5 in all 4.  Psychiatric: Alert and oriented x 3. Normal mood.   EKG: Personally reviewed. Sinus rhythm, rate 59, low  voltage.  Assessment/Plan Principal Problem:   Vertigo Active Problems:   Insulin  dependent type 2 diabetes mellitus (HCC)   Hypertension associated with diabetes (HCC)   Hyperlipidemia associated with type 2 diabetes mellitus (HCC)  CKD (chronic kidney disease), stage IV (HCC)   [name] is a 83 y.o. female with medical history significant for CKD stage IV, insulin-dependent T2DM, HTN, HLD, vertigo, renal cell carcinoma s/p right nephrectomy, OSA who is admitted with intractable vertigo.  MRI negative for acute CVA.  Assessment and Plan: Intractable vertigo: Patient with history of vertigo now with persistent vertiginous symptoms.  MRI brain negative for acute CVA.  Has had some relief with meclizine. - Scheduled meclizine 25 mg 3 times daily - IV Zofran as needed for nausea - PT eval, fall precautions  Musculoskeletal chest pain: Patient has reproducible chest wall pain.  EKG without acute ischemic changes and troponin is negative.  Stress test 07/17/2022 was a normal study.  No further cardiac workup indicated.  CKD stage IV History of RCC s/p right nephrectomy: Creatinine actually improved from baseline on admission.  Will continue to monitor.  Insulin-dependent type 2 diabetes: Placed on SSI.  Hypertension: Resume amlodipine and hydralazine in AM.  Hyperlipidemia: Continue atorvastatin.  Memory: Continue donepezil.   DVT prophylaxis: heparin injection 5,000 Units Start: 10/03/23 0600 Code Status: Full code, confirmed with patient on admission Family Communication: Discussed with patient, she has discussed with family Disposition Plan: From home, dispo pending clinical progress Consults called: None Severity of Illness: The appropriate patient status for this patient is OBSERVATION. Observation status is judged to be reasonable and necessary in order to provide the required intensity of service to ensure the patient's safety. The patient's presenting symptoms,  physical exam findings, and initial radiographic and laboratory data in the context of their medical condition is felt to place them at decreased risk for further clinical deterioration. Furthermore, it is anticipated that the patient will be medically stable for discharge from the hospital within 2 midnights of admission.   Jorie Blanch MD [facility]  If 7PM-7AM, please contact night-coverage [email or web address]  10/02/2023, 10:26 PM

## 2023-10-02 NOTE — ED Notes (Signed)
 Pt provided with sandwich and water .   Pt denies further needs. Friend remains present at bedside.

## 2023-10-02 NOTE — ED Provider Notes (Signed)
 I took over care of this patient at 3:30 PM pending remainder of workup including MRI.  Patient is still feeling dizzy after meclizine . Physical Exam  BP (!) 156/54   Pulse 69   Temp 98.3 F (36.8 C) (Oral)   Resp 16   Ht 4' 11 (1.499 m)   Wt 63 kg   SpO2 96%   BMI 28.07 kg/m   Physical Exam Vitals and nursing note reviewed.  Constitutional:      General: She is not in acute distress.    Appearance: She is well-developed. She is not ill-appearing.  HENT:     Head: Normocephalic and atraumatic.  Eyes:     Conjunctiva/sclera: Conjunctivae normal.  Cardiovascular:     Rate and Rhythm: Normal rate and regular rhythm.     Heart sounds: No murmur heard. Pulmonary:     Effort: Pulmonary effort is normal. No respiratory distress.     Breath sounds: Normal breath sounds.  Abdominal:     Palpations: Abdomen is soft.     Tenderness: There is no abdominal tenderness.  Musculoskeletal:        General: No swelling.     Cervical back: Neck supple.  Skin:    General: Skin is warm and dry.     Capillary Refill: Capillary refill takes less than 2 seconds.  Neurological:     General: No focal deficit present.     Mental Status: She is alert.  Psychiatric:        Mood and Affect: Mood normal.     Procedures  Procedures  ED Course / MDM    Medical Decision Making Patient here for double vision or tonsillar vertigo.  Was given meclizine  without improvement in her symptoms.  MRI shows some T2 enhancement.  She was given Valium  and fluids and is still feeling very dizzy having double vision unable to ambulate.  I discussed patient's case with neurology, Dr Cleave, he reviewed the patient's MRI and feels that this is likely peripheral etiology.  Discussed patient's case with Dr. Tobie, who will admit the patient for further workup and management to the service.  Patient and family agreeable with plan.  They were updated on results.  Problems Addressed: Double vision: undiagnosed new  problem with uncertain prognosis Vertigo: undiagnosed new problem with uncertain prognosis  Amount and/or Complexity of Data Reviewed Labs: ordered. Decision-making details documented in ED Course.    Details: Reviewed.  Fairly unremarkable for any acute etiology Radiology: ordered and independent interpretation performed. Decision-making details documented in ED Course.    Details: MRI reviewed and discussed with neurology, he feels that these changes are likely chronic in nature Discussion of management or test interpretation with external provider(s): Dr. Trenia -I discussed patient's case with him and he reviewed imaging and thinks peripheral etiology at this time  Dr. Tobie - hospitalist -will admit the patient to his service for further workup and management  Risk OTC drugs. Prescription drug management. Parenteral controlled substances. Drug therapy requiring intensive monitoring for toxicity. Decision regarding hospitalization.         Gennaro Duwaine CROME, DO 10/02/23 2245

## 2023-10-03 ENCOUNTER — Encounter (HOSPITAL_COMMUNITY): Payer: Self-pay | Admitting: Internal Medicine

## 2023-10-03 DIAGNOSIS — R42 Dizziness and giddiness: Secondary | ICD-10-CM | POA: Diagnosis not present

## 2023-10-03 LAB — CBC
HCT: 38.6 % (ref 36.0–46.0)
Hemoglobin: 12 g/dL (ref 12.0–15.0)
MCH: 31.2 pg (ref 26.0–34.0)
MCHC: 31.1 g/dL (ref 30.0–36.0)
MCV: 100.3 fL — ABNORMAL HIGH (ref 80.0–100.0)
Platelets: 221 K/uL (ref 150–400)
RBC: 3.85 MIL/uL — ABNORMAL LOW (ref 3.87–5.11)
RDW: 14.1 % (ref 11.5–15.5)
WBC: 5 K/uL (ref 4.0–10.5)
nRBC: 0 % (ref 0.0–0.2)

## 2023-10-03 LAB — BASIC METABOLIC PANEL WITH GFR
Anion gap: 8 (ref 5–15)
BUN: 24 mg/dL — ABNORMAL HIGH (ref 8–23)
CO2: 26 mmol/L (ref 22–32)
Calcium: 9.2 mg/dL (ref 8.9–10.3)
Chloride: 109 mmol/L (ref 98–111)
Creatinine, Ser: 1.7 mg/dL — ABNORMAL HIGH (ref 0.44–1.00)
GFR, Estimated: 29 mL/min — ABNORMAL LOW (ref >60)
Glucose, Bld: 74 mg/dL (ref 70–99)
Potassium: 4.4 mmol/L (ref 3.5–5.1)
Sodium: 143 mmol/L (ref 135–145)

## 2023-10-03 LAB — GLUCOSE, CAPILLARY
Glucose-Capillary: 83 mg/dL (ref 70–99)
Glucose-Capillary: 177 mg/dL — ABNORMAL HIGH (ref 70–99)
Glucose-Capillary: 187 mg/dL — ABNORMAL HIGH (ref 70–99)
Glucose-Capillary: 285 mg/dL — ABNORMAL HIGH (ref 70–99)

## 2023-10-03 LAB — VITAMIN B12: Vitamin B-12: 1320 pg/mL — ABNORMAL HIGH (ref 180–914)

## 2023-10-03 MED ORDER — INSULIN ASPART 100 UNIT/ML IJ SOLN
3.0000 [IU] | Freq: Once | INTRAMUSCULAR | Status: AC
  Administered 2023-10-03: 3 [IU] via SUBCUTANEOUS

## 2023-10-03 NOTE — Care Management Obs Status (Signed)
 MEDICARE OBSERVATION STATUS NOTIFICATION   Patient Details  Name: [name] MRN: [id number] Date of Birth: [birth year]   Medicare Observation Status Notification Given:  Yes    Alfonse JONELLE Rex, RN 10/03/2023, 2:14 PM

## 2023-10-03 NOTE — Evaluation (Signed)
 Physical Therapy Evaluation Patient Details Name: [name] MRN: [id number] DOB: 1940-09-22 Today's Date: 10/03/2023  History of Present Illness  83 y.o. female with medical history significant for CKD stage IV, insulin -dependent T2DM, HTN, HLD, vertigo, tinnitus, renal cell carcinoma s/p right nephrectomy, OSA, urinary incontinence who presented to the ED for evaluation of dizziness.  Clinical Impression  Pt admitted with above diagnosis.  Pt currently with functional limitations due to the deficits listed below (see PT Problem List). Pt will benefit from acute skilled PT to increase their independence and safety with mobility to allow discharge.  Pt poor historian in regards to previous hx of vertigo as well as currently describing her symptoms.  Pt does report double vision with bil horizontal canal and bil Dix Hallpike testing and no true nystagmus observed however pt's eyelids actively twitching with positional testing.  Eyelids also twitching occasionally throughout session but not constant.  Pt reports more off balance, blurry vision, double vision, and a little dizzy as symptoms with session today.  Pt denies spinning sensation felt today.  Pt assisted with ambulating in hallway and had episodes of LOB (at least 3) especially without bil UE support.  Pt did utilize RW (although preferred not to use) initially and stability mildly improved.  Pt reports she has no family close and her friends are not able to assist her upon d/c.  She lives alone and has not needed use of assistive device.  Pt would benefit from f/u HHPT or OPPT with a therapist familiar with vestibular and balance issues (HHPT may be preferred as safest option if pt is not able to drive or have transportation) however OPPT vestibular rehab referral handout provided as a resource as well.    Vestibular assessment below:   10/03/23 1617  Symptom Behavior  Subjective history of current problem Pt reports previous hx of vertigo  however denies hearing BPPV.  She was prescribed Meclizine  and has at home however states she does not use it.  Also reports hx of tinnitus  Type of Dizziness  Diplopia;Blurred vision;Unsteady with head/body turns;Imbalance  Frequency of Dizziness reports constant but improved since admission  Symptom Nature Constant  Aggravating Factors Activity in general;Turning head quickly;Turning head sideways;Supine to sit  Relieving Factors Rest;Closing eyes  Progression of Symptoms Better  Oculomotor Exam  Spontaneous Absent  Gaze-induced  Absent  Head shaking Horizontal Absent  Head Shaking Vertical Absent  Smooth Pursuits Intact  Saccades Intact  Comment pt with occasional bil eyelids twitching  Positional Testing  Dix-Hallpike Dix-Hallpike Right;Dix-Hallpike Left  Horizontal Canal Testing Horizontal Canal Right;Horizontal Canal Left  Dix-Hallpike Right  Dix-Hallpike Right Symptoms No nystagmus;Other (comment) (pt reports double vision)  Dix-Hallpike Left  Dix-Hallpike Left Symptoms No nystagmus;Other (comment) (pt reports double vision)  Horizontal Canal Right  Horizontal Canal Right Symptoms  (pt reports double vision)  Horizontal Canal Left  Horizontal Canal Left Symptoms Normal;Other (comment) (pt reports double vision)          If plan is discharge home, recommend the following: A little help with walking and/or transfers;A little help with bathing/dressing/bathroom;Assistance with cooking/housework;Assist for transportation;Help with stairs or ramp for entrance   Can travel by private vehicle        Equipment Recommendations Rolling walker (2 wheels)  Recommendations for Other Services       Functional Status Assessment Patient has had a recent decline in their functional status and demonstrates the ability to make significant improvements in function in a reasonable and predictable amount  of time.     Precautions / Restrictions Precautions Precautions: Fall       Mobility  Bed Mobility Overal bed mobility: Needs Assistance Bed Mobility: Supine to Sit     Supine to sit: Min assist, Used rails, HOB elevated     General bed mobility comments: assist for trunk    Transfers Overall transfer level: Needs assistance Equipment used: Rolling walker (2 wheels) Transfers: Sit to/from Stand Sit to Stand: Min assist           General transfer comment: light assist to rise and stabilize, cues for UE support/assist    Ambulation/Gait Ambulation/Gait assistance: Min assist Gait Distance (Feet): 80 Feet (x2) Assistive device: Rolling walker (2 wheels) Gait Pattern/deviations: Step-through pattern, Decreased stride length, Ataxic Gait velocity: decr     General Gait Details: uncoordinated, ataxic gait so provided RW with little improvement better stability; pt feeling off balance and legs giving ; pt did not prefer RW so removed for 80 ft returning to room and pt requiring more assist for occasional LOB; cues for focusing on stationary object however pt continuously looking around environment (and also more unsteady with head movements)  Stairs            Wheelchair Mobility     Tilt Bed    Modified Rankin (Stroke Patients Only)       Balance                                             Pertinent Vitals/Pain Pain Assessment Pain Assessment: No/denies pain    Home Living Family/patient expects to be discharged to:: Private residence Living Arrangements: Alone     Home Access: Stairs to enter     Alternate Level Stairs-Number of Steps: flight Home Layout: Two level Home Equipment: None      Prior Function Prior Level of Function : Independent/Modified Independent;Driving                     Extremity/Trunk Assessment        Lower Extremity Assessment Lower Extremity Assessment: Overall WFL for tasks assessed       Communication   Communication Communication: No apparent  difficulties    Cognition Arousal: Alert Behavior During Therapy: WFL for tasks assessed/performed   PT - Cognitive impairments: No apparent impairments                         Following commands: Intact       Cueing       General Comments      Exercises     Assessment/Plan    PT Assessment Patient needs continued PT services  PT Problem List Decreased strength;Decreased balance;Decreased mobility;Decreased knowledge of use of DME       PT Treatment Interventions Gait training;DME instruction;Balance training;Canalith reposition;Functional mobility training;Therapeutic activities;Therapeutic exercise;Patient/family education;Stair training (compensation strategies, gaze stabilization exercises)    PT Goals (Current goals can be found in the Care Plan section)  Acute Rehab PT Goals PT Goal Formulation: With patient Time For Goal Achievement: 10/17/23 Potential to Achieve Goals: Good    Frequency Min 3X/week     Co-evaluation               AM-PAC PT 6 Clicks Mobility  Outcome Measure Help needed turning from your back to your side while  in a flat bed without using bedrails?: A Little Help needed moving from lying on your back to sitting on the side of a flat bed without using bedrails?: A Little Help needed moving to and from a bed to a chair (including a wheelchair)?: A Little Help needed standing up from a chair using your arms (e.g., wheelchair or bedside chair)?: A Little Help needed to walk in hospital room?: A Lot Help needed climbing 3-5 steps with a railing? : A Lot 6 Click Score: 16    End of Session Equipment Utilized During Treatment: Gait belt Activity Tolerance: Patient tolerated treatment well Patient left: in chair;with call bell/phone within reach;with chair alarm set;with family/visitor present Nurse Communication: Mobility status PT Visit Diagnosis: Ataxic gait (R26.0);Unsteadiness on feet (R26.81)    Time: 8690-8656 PT  Time Calculation (min) (ACUTE ONLY): 34 min   Charges:   PT Evaluation $PT Eval Moderate Complexity: 1 Mod PT Treatments $Gait Training: 8-22 mins PT General Charges $$ ACUTE PT VISIT: 1 Visit        Tari PT, DPT Physical Therapist Acute Rehabilitation Services Office: [phone]   Tari CROME [place] 10/03/2023, 4:24 PM

## 2023-10-03 NOTE — Plan of Care (Signed)

## 2023-10-03 NOTE — Progress Notes (Addendum)
 [facility] Progress Note  Patient: [name]     FMW:[id number]  DOA: 10/02/2023   PCP: Leigh Lung, MD       Brief hospital course: [name] is a 83 y.o. female with medical history significant for CKD stage IV, insulin -dependent T2DM, HTN, HLD, vertigo, renal cell carcinoma s/p right nephrectomy, OSA who presented to the ED for evaluation of dizziness.   Patient states that she has a history of vertigo.  She says over the last 2 months she has had intermittent episodes of dizziness/lightheadedness.  She uses meclizine  as needed at home.  She says more recently her dizziness has been persistent, occurring even while at rest.  She says mostly feeling lightheaded but sometimes spinning sensation.  She has had associated headache and nausea without emesis.  She has not passed out.  She has nearly fallen but has been able to catch herself.    Subjective:  No longer nauseated but very unsteady when attempting to ambulate  Assessment and Plan: Principal Problem:   Vertigo - improving but still not able to ambulate - MRI brain without contrast negative for acute intracranial abnormality. Age-related cerebral volume loss seen. Numerous scattered foci of increased T2 signal within the subcortical cerebral white matter noted.  - discussed with neuro- changes on MRI consistent with microvascular disease-  - PT eval completed - extremely unsteady today, and los her balance 3 x -  will hold off on discharging her today- PT will reassess tomorrow  - hold Meclizine  as it can also cause an unstable gait - will also check B12 level  Active Problems:      CKD stage IV History of RCC s/p right nephrectomy:  - Baseline creatinine ranges from 1.3-2.2   Insulin -dependent type 2 diabetes: - cont SSI - holding Tresiba     Hypertension: Resume amlodipine  and hydralazine      Hyperlipidemia: Continue atorvastatin .   Memory: Continue donepezil .      Code Status: Full  Code Total time on patient care: 45 min DVT prophylaxis:  heparin  injection 5,000 Units Start: 10/03/23 0600     Objective:   Vitals:   10/03/23 0450 10/03/23 0944 10/03/23 1140 10/03/23 1146  BP: (!) 144/59 (!) 148/59  (!) 156/59  Pulse: (!) 58 64 63 60  Resp: 18  18   Temp: 97.8 F (36.6 C)  98.7 F (37.1 C)   TempSrc:      SpO2: 95% 97% 98% 99%  Weight:      Height:       Filed Weights   10/02/23 1000  Weight: 63 kg   Exam: General exam: Appears comfortable  HEENT: oral mucosa moist Respiratory system: Clear to auscultation.  Cardiovascular system: S1 & S2 heard  Gastrointestinal system: Abdomen soft, non-tender, nondistended. Normal bowel sounds   Extremities: No cyanosis, clubbing or edema Psychiatry:  Mood & affect appropriate.      CBC: Recent Labs  Lab 10/02/23 1019 10/03/23 0454  WBC 5.9 5.0  HGB 12.8 12.0  HCT 40.8 38.6  MCV 98.8 100.3*  PLT 213 221   Basic Metabolic Panel: Recent Labs  Lab 10/02/23 1019 10/03/23 0454  NA 138 143  K 4.1 4.4  CL 105 109  CO2 24 26  GLUCOSE 139* 74  BUN 24* 24*  CREATININE 1.34* 1.70*  CALCIUM  9.6 9.2     Scheduled Meds:  amLODipine   10 mg Oral Daily   atorvastatin   10 mg Oral q morning   donepezil   10  mg Oral Daily   gabapentin   300 mg Oral Daily   And   gabapentin   600 mg Oral QHS   heparin   5,000 Units Subcutaneous Q8H   hydrALAZINE   50 mg Oral BID   insulin  aspart  0-9 Units Subcutaneous TID WC   meclizine   25 mg Oral TID    Imaging and lab data personally reviewed   Author: Namish Krise  10/03/2023 3:02 PM  To contact [facility]>   Check the care team in [facility] and look for the attending/consulting TR[facility] provider listed  Log into [email or web address] and use [place]'s universal password   Go to> [facility]  and find provider  If you still have difficulty reaching the provider, please page the [facility] (Director on Call) for the Hospitalists listed on amion

## 2023-10-03 NOTE — TOC Initial Note (Addendum)
 Transition of Care [facility]) - Initial/Assessment Note    Patient Details  Name: [name] MRN: [id number] Date of Birth: 06/02/1940  [facility] CM/SW Contact:    Alfonse JONELLE Rex, RN Phone Number: 10/03/2023, 2:32 PM  Clinical Narrative:  Met with patient at bedside to introduce role of TOC/NCM and review for dc planning, patient confirmed she has an established PCP, no current home care services or home DME, patient reports she is independent with he self care ane functional mobility. MOON reviewed and completed. PT eval, await recommendation. TOC will continue to follow.     -3:50pm PT eval completed, recommendation for HH PT and RW, pt agreeable, no preference. Request sent to attending to enter orders. Referral sent to [facility], rep-Mitch, accepted referral for RW, added to AVS.   4:03pm [facility], rep Amy ,accepted for [facility] PT ,added to AVS.                Expected Discharge Plan: OP Rehab Barriers to Discharge: Continued Medical Work up   Patient Goals and CMS Choice Patient states their goals for this hospitalization and ongoing recovery are:: return home          Expected Discharge Plan and Services       Living arrangements for the past 2 months: Single Family Home                                      Prior Living Arrangements/Services Living arrangements for the past 2 months: Single Family Home Lives with:: Self Patient language and need for interpreter reviewed:: Yes Do you feel safe going back to the place where you live?: Yes      Need for Family Participation in Patient Care: Yes (Comment) Care giver support system in place?: Yes (comment)   Criminal Activity/Legal Involvement Pertinent to Current Situation/Hospitalization: No - Comment as needed  Activities of Daily Living   ADL Screening (condition at time of admission) Independently performs ADLs?: Yes (appropriate for developmental age) Is the patient deaf or have  difficulty hearing?: No Does the patient have difficulty seeing, even when wearing glasses/contacts?: No Does the patient have difficulty concentrating, remembering, or making decisions?: No  Permission Sought/Granted                  Emotional Assessment Appearance:: Appears stated age Attitude/Demeanor/Rapport: Engaged Affect (typically observed): Accepting Orientation: : Oriented to Self, Oriented to Place, Oriented to  Time, Oriented to Situation Alcohol / Substance Use: Not Applicable Psych Involvement: No (comment)  Admission diagnosis:  Double vision [H53.2] Vertigo [R42] Patient Active Problem List   Diagnosis Date Noted   Vertigo 10/02/2023   CKD (chronic kidney disease), stage IV (HCC) 10/02/2023   Pain due to onychomycosis of toenails of both feet 09/01/2018   Left shoulder pain    Limited joint range of motion (ROM)    Chest pain 10/23/2015   Type 2 diabetes mellitus with obesity (HCC) 10/23/2015   Chest pain with high risk for cardiac etiology 07/28/2014   Insulin  dependent type 2 diabetes mellitus (HCC) 12/27/2010   Hypertension associated with diabetes (HCC) 12/27/2010   Hyperlipidemia associated with type 2 diabetes mellitus (HCC) 12/27/2010   GERD (gastroesophageal reflux disease) 12/27/2010   Renal cell adenocarcinoma (HCC) 12/27/2010   Arthritis 12/27/2010   Cervical stenosis of spine 12/27/2010   Carpal tunnel syndrome 12/27/2010   Asthma 12/27/2010  PCP:  Leigh Lung, MD Pharmacy:   [id number] [place], [place] - [address] [place] [address] Phone: [phone] Fax: [phone]  D[facility] - [facility] [address] [place] [address] Phone: [phone] Fax: [phone]     Social Drivers of Health (SDOH) Social History: SDOH Screenings   Food Insecurity: No Food Insecurity (10/03/2023)  Housing: Low Risk  (10/03/2023)  Transportation Needs: No Transportation Needs (09/19/2021)   Tobacco Use: Low Risk  (06/01/2023)   SDOH Interventions:     Readmission Risk Interventions     No data to display

## 2023-10-04 ENCOUNTER — Encounter (HOSPITAL_COMMUNITY): Payer: Self-pay | Admitting: Internal Medicine

## 2023-10-04 DIAGNOSIS — R42 Dizziness and giddiness: Secondary | ICD-10-CM | POA: Diagnosis not present

## 2023-10-04 LAB — GLUCOSE, CAPILLARY
Glucose-Capillary: 154 mg/dL — ABNORMAL HIGH (ref 70–99)
Glucose-Capillary: 169 mg/dL — ABNORMAL HIGH (ref 70–99)
Glucose-Capillary: 270 mg/dL — ABNORMAL HIGH (ref 70–99)
Glucose-Capillary: 254 mg/dL — ABNORMAL HIGH (ref 70–99)

## 2023-10-04 LAB — HEMOGLOBIN A1C
Hgb A1c MFr Bld: 7.4 % — ABNORMAL HIGH (ref 4.8–5.6)
Mean Plasma Glucose: 166 mg/dL

## 2023-10-04 MED ORDER — INSULIN ASPART 100 UNIT/ML IJ SOLN
2.0000 [IU] | Freq: Once | INTRAMUSCULAR | Status: AC
  Administered 2023-10-04: 2 [IU] via SUBCUTANEOUS

## 2023-10-04 NOTE — Plan of Care (Signed)
  Problem: Education: Goal: Knowledge of General Education information will improve Description: Including pain rating scale, medication(s)/side effects and non-pharmacologic comfort measures Outcome: Progressing   Problem: Safety: Goal: Ability to remain free from injury will improve Outcome: Progressing   Problem: Coping: Goal: Ability to adjust to condition or change in health will improve Outcome: Progressing   Problem: Skin Integrity: Goal: Risk for impaired skin integrity will decrease Outcome: Progressing

## 2023-10-04 NOTE — Plan of Care (Signed)

## 2023-10-04 NOTE — Discharge Summary (Signed)
 Physician Discharge Summary  [name] FMW:[id number] DOB: [birth year] DOA: 10/02/2023  PCP: Leigh Lung, MD  Admit date: 10/02/2023 Discharge date: 10/05/2023 Discharging to: home    Discharge Diagnoses:   Principal Problem:   Vertigo Active Problems:   Insulin  dependent type 2 diabetes mellitus (HCC)   Hypertension associated with diabetes (HCC)   Hyperlipidemia associated with type 2 diabetes mellitus (HCC)   CKD (chronic kidney disease), stage IV [facility])   Brief hospital course: [name] is a 83 y.o. female with medical history significant for CKD stage IV, insulin -dependent T2DM, HTN, HLD, vertigo, renal cell carcinoma s/p right nephrectomy, OSA who presented to the ED for evaluation of dizziness.   Patient states that she has a history of vertigo.  She says over the last 2 months she has had intermittent episodes of dizziness/lightheadedness.  She uses meclizine  as needed at home.  She says more recently her dizziness has been persistent, occurring even while at rest.  She says mostly feeling lightheaded but sometimes spinning sensation.  She has had associated headache and nausea without emesis.  She has not passed out.  She has nearly fallen but has been able to catch herself.      Assessment and Plan: Principal Problem:   Vertigo - MRI brain without contrast negative for acute intracranial abnormality. Age-related cerebral volume loss seen. Numerous scattered foci of increased T2 signal within the subcortical cerebral white matter noted.  - discussed with neuro- changes on MRI consistent with microvascular disease-  - B12 not low - PT eval completed again today- less balance issues today and able to do stairs    Active Problems:      CKD stage IV History of RCC s/p right nephrectomy:  - Baseline creatinine ranges from 1.3-2.2   Insulin -dependent type 2 diabetes: - cont SSI - holding Tresiba  in hospital   Hypertension: Resumed amlodipine  and hydralazine       Hyperlipidemia: Continue atorvastatin .   Dementia Continue donepezil - no obvious memory issues             Discharge Instructions   Allergies as of 10/05/2023       Reactions   Sulfa Antibiotics Itching        Medication List     STOP taking these medications    furosemide  20 MG tablet Commonly known as: LASIX        TAKE these medications    acetaminophen  650 MG CR tablet Commonly known as: Tylenol  8 Hour Take 1 tablet (650 mg total) by mouth every 8 (eight) hours as needed for pain. What changed: how much to take   albuterol  90 MCG/ACT inhaler Commonly known as: PROVENTIL ,VENTOLIN  Inhale 2 puffs into the lungs every 4 (four) hours as needed for wheezing or shortness of breath.   amLODipine  10 MG tablet Commonly known as: NORVASC  Take 1 tablet (10 mg total) by mouth daily.   aspirin  EC 81 MG tablet Take 81 mg by mouth at bedtime.   atorvastatin  10 MG tablet Commonly known as: LIPITOR Take 1 tablet (10 mg total) by mouth every morning.   Baqsimi  Two Pack 3 MG/DOSE Powd Generic drug: Glucagon  Use as needed nasally for severe hypoglycemia   carvedilol  6.25 MG tablet Commonly known as: COREG  Take 1 tablet (6.25 mg total) by mouth 2 (two) times daily.   CENTRUM SILVER ADULT 50+ PO Take 1 tablet by mouth daily.   cholecalciferol  1000 units tablet Commonly known as: VITAMIN D  Take 2,000 Units by mouth daily.  Dexcom G7 Sensor Misc Use as directed to monitor blood glucose. Change every 10 days.   Dexcom G7 Sensor Misc Use to check blood sugar as directed. Change every 10 days   donepezil  10 MG tablet Commonly known as: Aricept  Take 1 tablet (10 mg total) by mouth daily.   gabapentin  300 MG capsule Commonly known as: NEURONTIN  Take 1 capsule (300 mg total) by mouth in the morning AND 2 capsules (600 mg total) Nightly.   hydrALAZINE  50 MG tablet Commonly known as: APRESOLINE  Take 1 tablet (50 mg total) by mouth 2 (two) times daily.    HYDROcodone -acetaminophen  5-325 MG tablet Commonly known as: NORCO/VICODIN Take 0.5 tablets by mouth every 4-6 hours as needed for pain   meclizine  12.5 MG tablet Commonly known as: ANTIVERT  Take 1 tablet (12.5 mg total) by mouth 3 (three) times daily as needed for dizziness.   NovoLOG  FlexPen 100 UNIT/ML FlexPen Generic drug: insulin  aspart Inject 1-2 Units into the skin 2 (two) times daily before lunch and supper.   Tresiba  FlexTouch 200 UNIT/ML FlexTouch Pen Generic drug: insulin  degludec Inject 70 Units into the skin daily. What changed: how much to take               Durable Medical Equipment  (From admission, onward)           Start     Ordered   10/04/23 1625  For home use only DME Walker  Once       Question:  Patient needs a walker to treat with the following condition  Answer:  Unsteady gait   10/04/23 1625   10/03/23 1633  For home use only DME Walker rolling  Once       Question Answer Comment  Walker: With 5 Inch Wheels   Patient needs a walker to treat with the following condition Vertigo      10/03/23 1632            Follow-up Information     [facility] Follow up.   Why: [facility]   [address] Contact information: [address] [place] [address] [phone]         [facility] Follow up.   Why: [facility]   Physical Therapy Contact information: [address] [place] [address] [phone]                    The results of significant diagnostics from this hospitalization (including imaging, microbiology, ancillary and laboratory) are listed below for reference.    MR BRAIN WO CONTRAST Result Date: 10/02/2023 EXAM: MRI BRAIN WITHOUT CONTRAST 10/02/2023 05:04:00 PM TECHNIQUE: Multiplanar multisequence MRI of the head/brain was performed without the administration of intravenous contrast. COMPARISON: CT of the head dated 10/02/2023 and MRI  of the head dated 06/02/2023. CLINICAL HISTORY: Headache, neuro deficit; Mental status change, unknown cause. FINDINGS: BRAIN AND VENTRICLES: No acute infarct. No intracranial hemorrhage. No mass. No midline shift. No hydrocephalus. The sella is unremarkable. Normal flow voids. Age-related cerebral volume loss. Numerous scattered foci of increased T2 signal present within the subcortical cerebral white matter. ORBITS: The patient is status post bilateral lens replacement. SINUSES AND MASTOIDS: No acute abnormality. BONES AND SOFT TISSUES: Normal marrow signal. No acute soft tissue abnormality. IMPRESSION: 1. No acute intracranial abnormality. 2. Age-related cerebral volume loss. 3. Numerous scattered foci of increased T2 signal within the subcortical cerebral white matter. Electronically signed by: timothy  berrigan 10/02/2023 06:10 PM EDT RP Workstation: [id number]   CT Head Wo Contrast Result Date: 10/02/2023 CLINICAL DATA:  Mental status change, unknown cause EXAM: CT HEAD WITHOUT CONTRAST TECHNIQUE: Contiguous axial images were obtained from the base of the skull through the vertex without intravenous contrast. RADIATION DOSE REDUCTION: This exam was performed according to the departmental dose-optimization program which includes automated exposure control, adjustment of the mA and/or kV according to patient size and/or use of iterative reconstruction technique. COMPARISON:  June 01, 2023 FINDINGS: Brain: Proportional prominence of the ventricles and sulci, consistent with diffuse cerebral parenchymal volume loss. The ventricles otherwise maintained midline position without midline shift. Gray-white differentiation is preserved.Scattered periventricular white matter hypoattenuation, most consistent with changes of mild chronic ischemic microvascular disease.No evidence of acute territorial infarction, extra-axial fluid collection, hemorrhage, or mass lesion. The basilar cisterns are patent without downward  herniation. The cerebellar hemispheres and vermis are well formed without mass lesion or focal attenuation abnormality. Vascular: No hyperdense vessel. Calcified atherosclerotic plaque within the cavernous/supraclinoid ICA and intradural vertebral arteries. Skull: Normal. Negative for fracture or focal lesion. Sinuses/Orbits: The paranasal sinuses and mastoids are clear.The globes appear intact. No retrobulbar hematoma. Other: None. IMPRESSION: 1. No acute intracranial abnormality, specifically, no acute hemorrhage, territorial infarction, or intracranial mass. 2. Mild global cerebral volume loss with sequelae of chronic ischemic microvascular disease. Electronically Signed   By: Rogelia Myers M.D.   On: 10/02/2023 11:19   DG Chest 2 View Result Date: 10/02/2023 CLINICAL DATA:  Chest pain EXAM: CHEST - 2 VIEW COMPARISON:  06/01/2023 FINDINGS: Frontal and lateral views of the chest demonstrate an unremarkable cardiac silhouette. No acute airspace disease, effusion, or pneumothorax. No acute bony abnormalities. IMPRESSION: 1. No acute intrathoracic process. Electronically Signed   By: Ozell Daring M.D.   On: 10/02/2023 10:36   Labs:   Basic Metabolic Panel: Recent Labs  Lab 10/02/23 1019 10/03/23 0454  NA 138 143  K 4.1 4.4  CL 105 109  CO2 24 26  GLUCOSE 139* 74  BUN 24* 24*  CREATININE 1.34* 1.70*  CALCIUM  9.6 9.2     CBC: Recent Labs  Lab 10/02/23 1019 10/03/23 0454  WBC 5.9 5.0  HGB 12.8 12.0  HCT 40.8 38.6  MCV 98.8 100.3*  PLT 213 221         SIGNED:   True Atlas, MD  [facility] 10/05/2023, 11:36 AM Time taking on discharge: 50 minutes

## 2023-10-04 NOTE — Discharge Instructions (Signed)
 Please see an ophthalmologist if you continue to have double vision.   You were cared for by a hospitalist during your hospital stay. Please review all of you discharge paperwork on the day of discharge and be sure you have all of your prescribed medications and please read the below instructions:  Once you are discharged, your primary care physician will handle any further medical issues. Please note that NO REFILLS for any discharge medications will be authorized once you are discharged as it is imperative that you return to your primary care physician (or establish a relationship with a primary care physician if you do not have one) for your aftercare needs. Please obtain a follow up appointment with your primary care physician within 1-2 weeks of discharge. Please take all your medications with you for your next visit with your Primary MD. Please request your Primary MD to go over all Hospital Tests and Procedures, Radiological results at the follow up appointment. In some cases, there will be blood work, cultures and biopsy results pending at the time of your discharge. Please request that your primary care M.D. goes through all the records of your hospital data and follows up on these results. Please get all hospital records sent to your primary MD by signing hospital release before you go home or request your primary care doctor's office to assist with obtaining medical records.   You must read complete instructions/literature along with all the possible adverse reactions/side effects for all the medicines that have been prescribed to you. Take any new medicines after you have completely understood and accpet all the possible adverse reactions/side effects.  Please take medications as prescribed and speak with your doctor if changes are needed.   If you have smoked or chewed tobacco in the last 2 yrs please stop. Stop any regular alcohol  and or any recreational drug use. Wear Seat belts  while driving.   If you had Pneumonia at the Hospital: Please get a 2 view Chest X ray done in 6-8 weeks after hospital discharge or sooner if instructed by your Primary MD.   If you have Congestive Heart Failure: Follow a cardiac low salt diet and 1.5 lit/day fluid restriction. Please call your Cardiologist or Primary MD anytime you have any of the following symptoms:  1) 3 pound weight gain in 24 hours or 5 pounds in 1 week  2) shortness of breath, with or without a dry hacking cough  3) increasing swelling in the feet or stomach  4) if you have to sleep on extra pillows at night in order to breathe   If you have Diabetes: Check blood sugars 4 times/day- once on AM empty stomach and then before each meal. Log in all results and show them to your primary doctor at your next visit. If glucose readings are often under 60 or above 400 call your primary MD to see if medication dosages need to be adjusted   If you have Syncope (passing out) or Seizure/Convulsions/Epilepsy: Please do not drive, operate heavy machinery, participate in activities at heights or participate in high speed sports until you have seen by Primary MD or a Neurologist and advised to do so again. Per [place]  DMV statutes, patients with seizures are not allowed to drive until they have been seizure-free for six months.  Use caution when using heavy equipment or power tools. Avoid working on ladders or at heights. Take showers instead of baths. Ensure the water  temperature is not too high on  the home water  heater. Do not go swimming alone. Do not lock yourself in a room alone (i.e. bathroom). When caring for infants or small children, sit down when holding, feeding, or changing them to minimize risk of injury to the child in the event you have a seizure. Maintain good sleep hygiene. Avoid alcohol.    If you had Gastrointestinal Bleeding: Please ask your Primary MD to check a complete blood count within one week of  discharge or at your next visit. Your endoscopic/colonoscopic biopsies that are pending at the time of discharge will also need to followed by your Primary MD.    [place] can reach the hospitalist office at phone [phone] or fax [phone]   If you do not have a primary care physician, you can call [phone] for a physician referral.

## 2023-10-04 NOTE — Progress Notes (Signed)
 Physical Therapy Treatment Patient Details Name: [name] MRN: [id number] DOB: 07-18-1940 Today's Date: 10/04/2023   History of Present Illness 83 y.o. female with medical history significant for CKD stage IV, insulin -dependent T2DM, HTN, HLD, vertigo, tinnitus, renal cell carcinoma s/p right nephrectomy, OSA, urinary incontinence who presented to the ED for evaluation of dizziness.    PT Comments  Pt seen for vestibular re-assessment in setting of possible vertigo/BPPV (see vestibular assessment table copied below), in summary pt did not present with typical suite of symptoms of BPPV (either central or peripheral), pt complains of diplopia and throbbing pressure/pain when placed in provacative positions (fully supine). DixHallpike bilaterally negative. Minor response during Test of Skew (lateral deviation when gaze fixation removed) which is possibly suggestive of intraocular muscular weakness(?). Pt wears glasses and has not been wearing them; also has not been hydrating per her own report. Pt guided through bed mobility with minA, STS to RW with CGA, ambulation with RW CGA transitioned to Dynamic Gait Index balance assessment and scored 15/24 indicating HIGH risk of falls, pertinent positive include instability with head turns and slow pivot turn. Pt lives in [place] house with shower and bed upstairs, recommend that if pt should return home soon she sleep downstairs, sponge bathe, and utilize RW at all times. Pt does not have family nor friends who can stay with her 24/7. Recommend that when pt return home she start wearing her glasses daily. Pt would benefit from OPPT vestibular evaluation if possible and HH/caregiver services at least 3x week. We will continue to follow acutely.   10/04/23 1540  Vestibular Assessment  General Observation Pt reports history of vertigo but that this feels different, more like throbbing pressure and dizzyheadedness  Symptom Behavior  Type of Dizziness   Diplopia;Blurred vision;Unsteady with head/body turns;Imbalance;Comment (Throbbing/pressure)  Frequency of Dizziness Reports improvement if I don't lay down flat  Duration of Dizziness <60s, but returns quickly  Symptom Nature Positional;Variable;Intermittent  Aggravating Factors Activity in general;Turning head quickly;Turning head sideways;Supine to sit;Lying supine  Relieving Factors Rest;Closing eyes;Slow movements  Progression of Symptoms Better  Oculomotor Exam  Spontaneous Absent  Gaze-induced  Absent  Head shaking Horizontal Absent  Smooth Pursuits Intact  Saccades Intact  Comment pt with occasional bil eyelids twitching  Oculomotor Exam-Fixation Suppressed   Left Head Impulse Neg  Right Head Impulse Neg  Head Shaking Nystagmus-Horizontal Absent Nystagmus  Other Tests  Comments Test of Skew negative bilaterally, but with horizontal skew (laterally with gaze fixation removed) of L eye, possible ocular muscular weakness with central compensation?  Positional Testing  Dix-Hallpike Dix-Hallpike Right;Dix-Hallpike Left  Dix-Hallpike Right  Dix-Hallpike Right Duration 60  Dix-Hallpike Right Symptoms No nystagmus  Dix-Hallpike Left  Dix-Hallpike Left Duration 60  Dix-Hallpike Left Symptoms No nystagmus  Cognition  Cognition Orientation Level Oriented x 4  Positional Sensitivities  Up from Right Hallpike 3  Up from Left Hallpike 3       If plan is discharge home, recommend the following: A little help with walking and/or transfers;A little help with bathing/dressing/bathroom;Assistance with cooking/housework;Assist for transportation;Help with stairs or ramp for entrance   Can travel by private vehicle        Equipment Recommendations  Rolling walker (2 wheels)    Recommendations for Other Services       Precautions / Restrictions Precautions Precautions: Fall Restrictions Weight Bearing Restrictions Per Provider Order: No     Mobility  Bed Mobility Overal  bed mobility: Needs Assistance Bed Mobility: Supine to Sit  Supine to sit: Min assist, Used rails, HOB elevated     General bed mobility comments: assist for trunk    Transfers Overall transfer level: Needs assistance Equipment used: Rolling walker (2 wheels) Transfers: Sit to/from Stand, Bed to chair/wheelchair/BSC Sit to Stand: Contact guard assist   Step pivot transfers: Contact guard assist       General transfer comment: CGA, no physical assist required    Ambulation/Gait Ambulation/Gait assistance: Contact guard assist Gait Distance (Feet): 150 Feet Assistive device: Rolling walker (2 wheels), None Gait Pattern/deviations: Step-through pattern, Decreased stride length, Ataxic, Wide base of support Gait velocity: decr     General Gait Details: Pt instructed in gait training with RW and CGA, no physical assist required nor overt LOB noted, with RW pt with stable gait without major gait dysfunctions noted; after ~4ft removed RW and assessed pt's dynamic baalnce and pt with ML instability and stepping outside of her pathway, no physical assist required or overt LOB noted, ~1ft without RW, seated recovery period provided.   Stairs             Wheelchair Mobility     Tilt Bed    Modified Rankin (Stroke Patients Only)       Balance Overall balance assessment: Needs assistance Sitting-balance support: Feet supported, Single extremity supported Sitting balance-Leahy Scale: Good     Standing balance support: During functional activity, Bilateral upper extremity supported Standing balance-Leahy Scale: Fair Standing balance comment: Pt able to stand without support; during dynamic activities (gait) balance improves with support                 Standardized Balance Assessment Standardized Balance Assessment : Dynamic Gait Index   Dynamic Gait Index Level Surface: Mild Impairment Change in Gait Speed: Mild Impairment Gait with Horizontal Head  Turns: Mild Impairment Gait with Vertical Head Turns: Mild Impairment Gait and Pivot Turn: Mild Impairment Step Over Obstacle: Mild Impairment Step Around Obstacles: Mild Impairment Steps: Mild Impairment Total Score: 16      Communication Communication Communication: No apparent difficulties  Cognition Arousal: Alert Behavior During Therapy: WFL for tasks assessed/performed   PT - Cognitive impairments: No apparent impairments                         Following commands: Intact      Cueing Cueing Techniques: Verbal cues  Exercises      General Comments        Pertinent Vitals/Pain Pain Assessment Pain Assessment: No/denies pain    Home Living                          Prior Function            PT Goals (current goals can now be found in the care plan section) Acute Rehab PT Goals Patient Stated Goal: Go home PT Goal Formulation: With patient Time For Goal Achievement: 10/17/23 Potential to Achieve Goals: Good Progress towards PT goals: Progressing toward goals    Frequency    Min 3X/week      PT Plan      Co-evaluation              AM-PAC PT 6 Clicks Mobility   Outcome Measure  Help needed turning from your back to your side while in a flat bed without using bedrails?: A Little Help needed moving from lying on your back to sitting on the side of  a flat bed without using bedrails?: A Little Help needed moving to and from a bed to a chair (including a wheelchair)?: A Little Help needed standing up from a chair using your arms (e.g., wheelchair or bedside chair)?: A Little Help needed to walk in hospital room?: A Lot Help needed climbing 3-5 steps with a railing? : A Lot 6 Click Score: 16    End of Session Equipment Utilized During Treatment: Gait belt Activity Tolerance: Patient tolerated treatment well Patient left: in chair;with call bell/phone within reach;with chair alarm set;with family/visitor present Nurse  Communication: Mobility status PT Visit Diagnosis: Ataxic gait (R26.0);Unsteadiness on feet (R26.81)     Time: 1437-1530 PT Time Calculation (min) (ACUTE ONLY): 53 min  Charges:    $Gait Training: 8-22 mins $Therapeutic Activity: 8-22 mins $Physical Performance Test: 23-37 mins PT General Charges $$ ACUTE PT VISIT: 1 Visit                     Elsie Grieves, PT, DPT [facility] Department Office: [phone]   Elsie Grieves 10/04/2023, 3:54 PM

## 2023-10-04 NOTE — Progress Notes (Signed)
 [facility] Progress Note  Patient: [name]     FMW:[id number]  DOA: 10/02/2023   PCP: Leigh Lung, MD       Brief hospital course: [name] is a 83 y.o. female with medical history significant for CKD stage IV, insulin -dependent T2DM, HTN, HLD, vertigo, renal cell carcinoma s/p right nephrectomy, OSA who presented to the ED for evaluation of dizziness.   Patient states that she has a history of vertigo.  She says over the last 2 months she has had intermittent episodes of dizziness/lightheadedness.  She uses meclizine  as needed at home.  She says more recently her dizziness has been persistent, occurring even while at rest.  She says mostly feeling lightheaded but sometimes spinning sensation.  She has had associated headache and nausea without emesis.  She has not passed out.  She has nearly fallen but has been able to catch herself.    Subjective:  Still having double vision at times. Unsteady gait per PT  Assessment and Plan: Principal Problem:   Vertigo - improving but still not able to ambulate - MRI brain without contrast negative for acute intracranial abnormality. Age-related cerebral volume loss seen. Numerous scattered foci of increased T2 signal within the subcortical cerebral white matter noted.  - discussed with neuro- changes on MRI consistent with microvascular disease-  - PT eval completed - still unsteady today with PT - B12 not low  Active Problems:      CKD stage IV History of RCC s/p right nephrectomy:  - Baseline creatinine ranges from 1.3-2.2   Insulin -dependent type 2 diabetes: - cont SSI - holding Tresiba     Hypertension: Resumed amlodipine  and hydralazine      Hyperlipidemia: Continue atorvastatin .   Memory: Continue donepezil .      Code Status: Full Code Total time on patient care: 45 min DVT prophylaxis:  heparin  injection 5,000 Units Start: 10/03/23 0600     Objective:   Vitals:   10/03/23 2030 10/04/23 0427  10/04/23 0919 10/04/23 1304  BP: 129/89 (!) 150/61 (!) 161/59 (!) 152/60  Pulse: 71 60 68 73  Resp: 18 18 18 20   Temp: 98.8 F (37.1 C) 97.8 F (36.6 C) 98.7 F (37.1 C) 99.5 F (37.5 C)  TempSrc: Oral Oral Oral Oral  SpO2: 97% 96% 99% 98%  Weight:      Height:       Filed Weights   10/02/23 1000  Weight: 63 kg   Exam: General exam: Appears comfortable  HEENT: oral mucosa moist Respiratory system: Clear to auscultation.  Cardiovascular system: S1 & S2 heard  Gastrointestinal system: Abdomen soft, non-tender, nondistended. Normal bowel sounds   Extremities: No cyanosis, clubbing or edema Psychiatry:  Mood & affect appropriate.      CBC: Recent Labs  Lab 10/02/23 1019 10/03/23 0454  WBC 5.9 5.0  HGB 12.8 12.0  HCT 40.8 38.6  MCV 98.8 100.3*  PLT 213 221   Basic Metabolic Panel: Recent Labs  Lab 10/02/23 1019 10/03/23 0454  NA 138 143  K 4.1 4.4  CL 105 109  CO2 24 26  GLUCOSE 139* 74  BUN 24* 24*  CREATININE 1.34* 1.70*  CALCIUM  9.6 9.2     Scheduled Meds:  amLODipine   10 mg Oral Daily   atorvastatin   10 mg Oral q morning   donepezil   10 mg Oral Daily   gabapentin   300 mg Oral Daily   And   gabapentin   600 mg Oral QHS   heparin   5,000 Units Subcutaneous Q8H   hydrALAZINE   50 mg Oral BID   insulin  aspart  0-9 Units Subcutaneous TID WC    Imaging and lab data personally reviewed   Author: Jonisha Kindig  10/04/2023 4:59 PM  To contact [facility]>   Check the care team in [facility] and look for the attending/consulting [facility] provider listed  Log into [email or web address] and use [place]'s universal password   Go to> [facility]  and find provider  If you still have difficulty reaching the provider, please page the [facility] (Director on Call) for the Hospitalists listed on amion

## 2023-10-05 LAB — GLUCOSE, CAPILLARY
Glucose-Capillary: 174 mg/dL — ABNORMAL HIGH (ref 70–99)
Glucose-Capillary: 255 mg/dL — ABNORMAL HIGH (ref 70–99)
Glucose-Capillary: 256 mg/dL — ABNORMAL HIGH (ref 70–99)
Glucose-Capillary: 226 mg/dL — ABNORMAL HIGH (ref 70–99)

## 2023-10-05 NOTE — Plan of Care (Signed)

## 2023-10-05 NOTE — TOC Transition Note (Addendum)
 Transition of Care [facility]) - Discharge Note   Patient Details  Name: [name] MRN: [id number] Date of Birth: [birth year]  Transition [facility] CM/SW Contact:  Sonda Manuella Quill, RN Phone Number: 10/05/2023, 11:45 AM   Clinical Narrative:    D/C orders received; RW delivered per [employer]; HHPT per [facility]; Amy Hyatt at agency notified; no TOC needs.  -1703- notified by [place], RN RW not delivered; referral placed 10/02/25 at 1550; LVMs for Mitch and Ada at agency; awaiting return call.  -1719- pt's d/c address verified:[address] [address]; notified Carlene after hours at [employer] [phone]); she will have on call rep for agency return call.  -1802- no return call from on call for [employer]; spoke w/ Carlene; she will place another page to on call.  -1804- return call from [place], she states pt RW not delivered b/c pt has $8.62; pt notified by [facility]; balance pt; [employer] RW will be delivered topt's home this pm: d/c address confirmed: [address] [place] [phone]; no TOC needs.  Final next level of care: Home w Home Health Services Barriers to Discharge: No Barriers Identified   Patient Goals and CMS Choice Patient states their goals for this hospitalization and ongoing recovery are:: return home          Discharge Placement                       Discharge Plan and Services Additional resources added to the After Visit Summary for                  DME Arranged: N/A DME Agency: NA                  Social Drivers of Health (SDOH) Interventions SDOH Screenings   Food Insecurity: No Food Insecurity (10/03/2023)  Housing: Low Risk  (10/03/2023)  Transportation Needs: No Transportation Needs (10/04/2023)  Utilities: Not At Risk (10/04/2023)  Social Connections: Moderately Integrated (10/04/2023)  Tobacco Use: Low Risk  (06/01/2023)     Readmission Risk Interventions     No data to display

## 2023-10-05 NOTE — Progress Notes (Signed)
 Physical Therapy Treatment Patient Details Name: [name] MRN: [id number] DOB: 1941/02/09 Today's Date: 10/05/2023   History of Present Illness 83 y.o. female with medical history significant for CKD stage IV, insulin -dependent T2DM, HTN, HLD, vertigo, tinnitus, renal cell carcinoma s/p right nephrectomy, OSA, urinary incontinence who presented to the ED for evaluation of dizziness.    PT Comments  Pt in recliner on arrival to room and reports she feels ready to d/c home today.  Pt ambulated in hallway with RW and practiced stairs (since her bedroom and full bathroom is upstairs).  Pt did ambulate without RW (to challenge balance) and required min assist so encouraged and stressed use of RW upon return home for safety and decreasing fall risk.  Pt did also practice stairs (as she will likely attempt upon return home) however recommended initially staying on main level if possible.     If plan is discharge home, recommend the following: A little help with walking and/or transfers;A little help with bathing/dressing/bathroom;Assistance with cooking/housework;Assist for transportation;Help with stairs or ramp for entrance   Can travel by private vehicle        Equipment Recommendations  Rolling walker (2 wheels)    Recommendations for Other Services       Precautions / Restrictions Precautions Precautions: Fall     Mobility  Bed Mobility               General bed mobility comments: pt in recliner    Transfers Overall transfer level: Needs assistance Equipment used: Rolling walker (2 wheels) Transfers: Sit to/from Stand Sit to Stand: Contact guard assist           General transfer comment: CGA, no physical assist required    Ambulation/Gait Ambulation/Gait assistance: Contact guard assist Gait Distance (Feet): 260 Feet Assistive device: Rolling walker (2 wheels), None Gait Pattern/deviations: Step-through pattern, Decreased stride length, Ataxic, Wide base of  support Gait velocity: decr     General Gait Details: pt with improved stability with RW today and cues for focus on stationary object; pt wanted more challenge so removed RW for her to challenge balance with PT holding gait belt - which she required min assist at times for LOB, performed approx 150 ft without RW, returned RW and pt's stability improved again only CGA for safety; pt agreeable to use RW upon d/c for safety and decreasing fall risk   Stairs Stairs: Yes Stairs assistance: Contact guard assist Stair Management: Step to pattern, Forwards, One rail Right Number of Stairs: 10 General stair comments: verbal cues for safety and sequencing; able to hold right rail ascending with one hand and reciprocal pattern however descending required both hands and step to pattern; pt provided with safety cues but did not require physical assist; encouraged to remain on main level of home upon d/c initially for safety   Wheelchair Mobility     Tilt Bed    Modified Rankin (Stroke Patients Only)       Balance                                            Communication Communication Communication: No apparent difficulties  Cognition Arousal: Alert Behavior During Therapy: WFL for tasks assessed/performed   PT - Cognitive impairments: No apparent impairments  Following commands: Intact      Cueing Cueing Techniques: Verbal cues  Exercises      General Comments        Pertinent Vitals/Pain Pain Assessment Pain Assessment: No/denies pain    Home Living                          Prior Function            PT Goals (current goals can now be found in the care plan section) Progress towards PT goals: Progressing toward goals    Frequency    Min 3X/week      PT Plan      Co-evaluation              AM-PAC PT 6 Clicks Mobility   Outcome Measure  Help needed turning from your back to your side  while in a flat bed without using bedrails?: A Little Help needed moving from lying on your back to sitting on the side of a flat bed without using bedrails?: A Little Help needed moving to and from a bed to a chair (including a wheelchair)?: A Little Help needed standing up from a chair using your arms (e.g., wheelchair or bedside chair)?: A Little Help needed to walk in hospital room?: A Little Help needed climbing 3-5 steps with a railing? : A Little 6 Click Score: 18    End of Session Equipment Utilized During Treatment: Gait belt Activity Tolerance: Patient tolerated treatment well Patient left: in chair;with chair alarm set;with call bell/phone within reach Nurse Communication: Mobility status PT Visit Diagnosis: Ataxic gait (R26.0);Unsteadiness on feet (R26.81)     Time: 8869-8851 PT Time Calculation (min) (ACUTE ONLY): 18 min  Charges:    $Gait Training: 8-22 mins PT General Charges $$ ACUTE PT VISIT: 1 Visit                    [name], DPT Physical Therapist Acute Rehabilitation Services Office: [phone]    [name] 10/05/2023, 3:50 PM

## 2023-10-05 NOTE — Plan of Care (Signed)
 Problem: Education: Goal: Knowledge of General Education information will improve Description: Including pain rating scale, medication(s)/side effects and non-pharmacologic comfort measures 10/05/2023 1625 by Jene Hurl, RN Outcome: Adequate for Discharge 10/05/2023 1623 by Jene Hurl, RN Outcome: Progressing   Problem: Health Behavior/Discharge Planning: Goal: Ability to manage health-related needs will improve 10/05/2023 1625 by Jene Hurl, RN Outcome: Adequate for Discharge 10/05/2023 1623 by Jene Hurl, RN Outcome: Progressing   Problem: Clinical Measurements: Goal: Ability to maintain clinical measurements within normal limits will improve 10/05/2023 1625 by Jene Hurl, RN Outcome: Adequate for Discharge 10/05/2023 1623 by Jene Hurl, RN Outcome: Progressing Goal: Will remain free from infection 10/05/2023 1625 by Jene Hurl, RN Outcome: Adequate for Discharge 10/05/2023 1623 by Jene Hurl, RN Outcome: Progressing Goal: Diagnostic test results will improve 10/05/2023 1625 by Jene Hurl, RN Outcome: Adequate for Discharge 10/05/2023 1623 by Jene Hurl, RN Outcome: Progressing Goal: Respiratory complications will improve 10/05/2023 1625 by Jene Hurl, RN Outcome: Adequate for Discharge 10/05/2023 1623 by Jene Hurl, RN Outcome: Progressing Goal: Cardiovascular complication will be avoided 10/05/2023 1625 by Jene Hurl, RN Outcome: Adequate for Discharge 10/05/2023 1623 by Jene Hurl, RN Outcome: Progressing   Problem: Activity: Goal: Risk for activity intolerance will decrease 10/05/2023 1625 by Jene Hurl, RN Outcome: Adequate for Discharge 10/05/2023 1623 by Jene Hurl, RN Outcome: Progressing   Problem: Nutrition: Goal: Adequate nutrition will be maintained 10/05/2023 1625 by Jene Hurl, RN Outcome: Adequate for Discharge 10/05/2023 1623 by Jene Hurl, RN Outcome: Progressing   Problem: Coping: Goal: Level of anxiety will  decrease 10/05/2023 1625 by Jene Hurl, RN Outcome: Adequate for Discharge 10/05/2023 1623 by Jene Hurl, RN Outcome: Progressing   Problem: Elimination: Goal: Will not experience complications related to bowel motility 10/05/2023 1625 by Jene Hurl, RN Outcome: Adequate for Discharge 10/05/2023 1623 by Jene Hurl, RN Outcome: Progressing Goal: Will not experience complications related to urinary retention 10/05/2023 1625 by Jene Hurl, RN Outcome: Adequate for Discharge 10/05/2023 1623 by Jene Hurl, RN Outcome: Progressing   Problem: Pain Managment: Goal: General experience of comfort will improve and/or be controlled 10/05/2023 1625 by Jene Hurl, RN Outcome: Adequate for Discharge 10/05/2023 1623 by Jene Hurl, RN Outcome: Progressing   Problem: Safety: Goal: Ability to remain free from injury will improve 10/05/2023 1625 by Jene Hurl, RN Outcome: Adequate for Discharge 10/05/2023 1623 by Jene Hurl, RN Outcome: Progressing   Problem: Skin Integrity: Goal: Risk for impaired skin integrity will decrease 10/05/2023 1625 by Jene Hurl, RN Outcome: Adequate for Discharge 10/05/2023 1623 by Jene Hurl, RN Outcome: Progressing   Problem: Education: Goal: Ability to describe self-care measures that may prevent or decrease complications (Diabetes Survival Skills Education) will improve 10/05/2023 1625 by Jene Hurl, RN Outcome: Adequate for Discharge 10/05/2023 1623 by Jene Hurl, RN Outcome: Progressing Goal: Individualized Educational Video(s) 10/05/2023 1625 by Jene Hurl, RN Outcome: Adequate for Discharge 10/05/2023 1623 by Jene Hurl, RN Outcome: Progressing   Problem: Coping: Goal: Ability to adjust to condition or change in health will improve 10/05/2023 1625 by Jene Hurl, RN Outcome: Adequate for Discharge 10/05/2023 1623 by Jene Hurl, RN Outcome: Progressing   Problem: Fluid Volume: Goal: Ability to maintain a balanced intake and  output will improve 10/05/2023 1625 by Jene Hurl, RN Outcome: Adequate for Discharge 10/05/2023 1623 by Jene Hurl, RN Outcome: Progressing   Problem: Health Behavior/Discharge Planning: Goal: Ability to identify and utilize available resources and services will improve 10/05/2023 1625 by Jene Hurl, RN Outcome: Adequate for Discharge 10/05/2023 1623  by Jene Hurl, RN Outcome: Progressing Goal: Ability to manage health-related needs will improve 10/05/2023 1625 by Jene Hurl, RN Outcome: Adequate for Discharge 10/05/2023 1623 by Jene Hurl, RN Outcome: Progressing   Problem: Metabolic: Goal: Ability to maintain appropriate glucose levels will improve 10/05/2023 1625 by Jene Hurl, RN Outcome: Adequate for Discharge 10/05/2023 1623 by Jene Hurl, RN Outcome: Progressing   Problem: Nutritional: Goal: Maintenance of adequate nutrition will improve 10/05/2023 1625 by Jene Hurl, RN Outcome: Adequate for Discharge 10/05/2023 1623 by Jene Hurl, RN Outcome: Progressing Goal: Progress toward achieving an optimal weight will improve 10/05/2023 1625 by Jene Hurl, RN Outcome: Adequate for Discharge 10/05/2023 1623 by Jene Hurl, RN Outcome: Progressing   Problem: Skin Integrity: Goal: Risk for impaired skin integrity will decrease 10/05/2023 1625 by Jene Hurl, RN Outcome: Adequate for Discharge 10/05/2023 1623 by Jene Hurl, RN Outcome: Progressing   Problem: Tissue Perfusion: Goal: Adequacy of tissue perfusion will improve 10/05/2023 1625 by Jene Hurl, RN Outcome: Adequate for Discharge 10/05/2023 1623 by Jene Hurl, RN Outcome: Progressing

## 2023-10-05 NOTE — Plan of Care (Signed)

## 2023-10-06 ENCOUNTER — Other Ambulatory Visit (HOSPITAL_COMMUNITY): Payer: Self-pay

## 2023-10-06 ENCOUNTER — Other Ambulatory Visit: Payer: Self-pay

## 2023-10-06 DIAGNOSIS — R269 Unspecified abnormalities of gait and mobility: Secondary | ICD-10-CM | POA: Diagnosis not present

## 2023-10-06 MED ORDER — NOVOLOG FLEXPEN 100 UNIT/ML ~~LOC~~ SOPN
1.0000 [IU] | PEN_INJECTOR | Freq: Two times a day (BID) | SUBCUTANEOUS | 5 refills | Status: AC
  Filled 2023-10-06: qty 3, 28d supply, fill #0
  Filled 2023-10-30: qty 3, 28d supply, fill #1
  Filled 2023-12-09: qty 3, 28d supply, fill #2

## 2023-10-07 ENCOUNTER — Other Ambulatory Visit: Payer: Self-pay

## 2023-10-07 ENCOUNTER — Other Ambulatory Visit (HOSPITAL_COMMUNITY): Payer: Self-pay

## 2023-10-09 ENCOUNTER — Other Ambulatory Visit: Payer: Self-pay

## 2023-10-13 ENCOUNTER — Other Ambulatory Visit (HOSPITAL_COMMUNITY): Payer: Self-pay

## 2023-10-15 ENCOUNTER — Other Ambulatory Visit (HOSPITAL_COMMUNITY): Payer: Self-pay

## 2023-10-15 MED ORDER — NOVOLOG FLEXPEN 100 UNIT/ML ~~LOC~~ SOPN
1.0000 [IU] | PEN_INJECTOR | Freq: Two times a day (BID) | SUBCUTANEOUS | 5 refills | Status: DC
  Filled 2023-10-15: qty 3, 75d supply, fill #0

## 2023-10-21 ENCOUNTER — Other Ambulatory Visit (HOSPITAL_COMMUNITY): Payer: Self-pay

## 2023-10-21 DIAGNOSIS — M545 Low back pain, unspecified: Secondary | ICD-10-CM | POA: Diagnosis not present

## 2023-10-21 DIAGNOSIS — M150 Primary generalized (osteo)arthritis: Secondary | ICD-10-CM | POA: Diagnosis not present

## 2023-10-21 DIAGNOSIS — E1122 Type 2 diabetes mellitus with diabetic chronic kidney disease: Secondary | ICD-10-CM | POA: Diagnosis not present

## 2023-10-21 DIAGNOSIS — I129 Hypertensive chronic kidney disease with stage 1 through stage 4 chronic kidney disease, or unspecified chronic kidney disease: Secondary | ICD-10-CM | POA: Diagnosis not present

## 2023-10-21 DIAGNOSIS — N189 Chronic kidney disease, unspecified: Secondary | ICD-10-CM | POA: Diagnosis not present

## 2023-10-21 MED ORDER — HYDROCODONE-ACETAMINOPHEN 5-325 MG PO TABS
0.5000 | ORAL_TABLET | Freq: Four times a day (QID) | ORAL | 0 refills | Status: DC | PRN
  Filled 2023-10-21: qty 30, 15d supply, fill #0

## 2023-10-22 ENCOUNTER — Other Ambulatory Visit: Payer: Self-pay

## 2023-10-22 ENCOUNTER — Other Ambulatory Visit: Payer: Self-pay | Admitting: Cardiology

## 2023-10-23 ENCOUNTER — Other Ambulatory Visit (HOSPITAL_COMMUNITY): Payer: Self-pay

## 2023-10-23 ENCOUNTER — Other Ambulatory Visit: Payer: Self-pay

## 2023-10-23 MED ORDER — CARVEDILOL 6.25 MG PO TABS
6.2500 mg | ORAL_TABLET | Freq: Two times a day (BID) | ORAL | 0 refills | Status: DC
  Filled 2023-10-23 – 2023-10-27 (×2): qty 60, 30d supply, fill #0

## 2023-10-24 ENCOUNTER — Other Ambulatory Visit: Payer: Self-pay

## 2023-10-26 ENCOUNTER — Other Ambulatory Visit (HOSPITAL_COMMUNITY): Payer: Self-pay

## 2023-10-27 ENCOUNTER — Other Ambulatory Visit: Payer: Self-pay

## 2023-10-27 ENCOUNTER — Other Ambulatory Visit (HOSPITAL_COMMUNITY): Payer: Self-pay

## 2023-10-29 ENCOUNTER — Other Ambulatory Visit (HOSPITAL_COMMUNITY): Payer: Self-pay

## 2023-10-29 ENCOUNTER — Other Ambulatory Visit: Payer: Self-pay

## 2023-11-06 ENCOUNTER — Other Ambulatory Visit (HOSPITAL_COMMUNITY): Payer: Self-pay

## 2023-11-06 DIAGNOSIS — M5416 Radiculopathy, lumbar region: Secondary | ICD-10-CM | POA: Diagnosis not present

## 2023-11-06 MED ORDER — DEXCOM G7 SENSOR MISC
5 refills | Status: AC
  Filled 2023-11-06 – 2023-11-28 (×2): qty 3, 30d supply, fill #0
  Filled 2023-12-15: qty 3, 30d supply, fill #1
  Filled 2024-01-19: qty 3, 30d supply, fill #2
  Filled 2024-02-22 – 2024-03-01 (×3): qty 3, 30d supply, fill #3

## 2023-11-07 ENCOUNTER — Other Ambulatory Visit (HOSPITAL_COMMUNITY): Payer: Self-pay

## 2023-11-10 ENCOUNTER — Other Ambulatory Visit (HOSPITAL_COMMUNITY): Payer: Self-pay

## 2023-11-10 MED ORDER — GABAPENTIN 300 MG PO CAPS
300.0000 mg | ORAL_CAPSULE | Freq: Every day | ORAL | 0 refills | Status: DC
  Filled 2023-11-11: qty 21, 21d supply, fill #0

## 2023-11-10 MED ORDER — FUROSEMIDE 20 MG PO TABS
20.0000 mg | ORAL_TABLET | Freq: Every day | ORAL | 1 refills | Status: AC
  Filled 2023-11-11: qty 21, 21d supply, fill #0
  Filled 2023-11-19: qty 21, 21d supply, fill #1
  Filled 2023-11-27: qty 30, 30d supply, fill #1
  Filled 2023-12-29: qty 30, 30d supply, fill #2
  Filled 2024-01-21 – 2024-01-22 (×2): qty 30, 30d supply, fill #3
  Filled 2024-02-24: qty 30, 30d supply, fill #4
  Filled 2024-03-25: qty 30, 30d supply, fill #5

## 2023-11-11 ENCOUNTER — Other Ambulatory Visit: Payer: Self-pay

## 2023-11-11 ENCOUNTER — Other Ambulatory Visit (HOSPITAL_COMMUNITY): Payer: Self-pay

## 2023-11-11 DIAGNOSIS — E1165 Type 2 diabetes mellitus with hyperglycemia: Secondary | ICD-10-CM | POA: Diagnosis not present

## 2023-11-11 DIAGNOSIS — I10 Essential (primary) hypertension: Secondary | ICD-10-CM | POA: Diagnosis not present

## 2023-11-11 DIAGNOSIS — E785 Hyperlipidemia, unspecified: Secondary | ICD-10-CM | POA: Diagnosis not present

## 2023-11-11 MED ORDER — BAQSIMI TWO PACK 3 MG/DOSE NA POWD
NASAL | 1 refills | Status: AC
  Filled 2023-11-11: qty 2, 30d supply, fill #0

## 2023-11-12 ENCOUNTER — Observation Stay (HOSPITAL_COMMUNITY)
Admission: EM | Admit: 2023-11-12 | Discharge: 2023-11-13 | Disposition: A | Discharge status: Home / Self Care | Attending: Internal Medicine | Admitting: Internal Medicine

## 2023-11-12 ENCOUNTER — Emergency Department (HOSPITAL_COMMUNITY)

## 2023-11-12 ENCOUNTER — Other Ambulatory Visit (HOSPITAL_COMMUNITY): Payer: Self-pay

## 2023-11-12 ENCOUNTER — Other Ambulatory Visit: Payer: Self-pay

## 2023-11-12 DIAGNOSIS — R600 Localized edema: Secondary | ICD-10-CM | POA: Diagnosis not present

## 2023-11-12 DIAGNOSIS — J45909 Unspecified asthma, uncomplicated: Secondary | ICD-10-CM | POA: Insufficient documentation

## 2023-11-12 DIAGNOSIS — R9431 Abnormal electrocardiogram [ECG] [EKG]: Secondary | ICD-10-CM | POA: Diagnosis not present

## 2023-11-12 DIAGNOSIS — E875 Hyperkalemia: Secondary | ICD-10-CM | POA: Diagnosis not present

## 2023-11-12 DIAGNOSIS — N184 Chronic kidney disease, stage 4 (severe): Secondary | ICD-10-CM | POA: Insufficient documentation

## 2023-11-12 DIAGNOSIS — E1169 Type 2 diabetes mellitus with other specified complication: Secondary | ICD-10-CM | POA: Diagnosis not present

## 2023-11-12 DIAGNOSIS — Z7982 Long term (current) use of aspirin: Secondary | ICD-10-CM | POA: Insufficient documentation

## 2023-11-12 DIAGNOSIS — E119 Type 2 diabetes mellitus without complications: Secondary | ICD-10-CM

## 2023-11-12 DIAGNOSIS — E162 Hypoglycemia, unspecified: Secondary | ICD-10-CM

## 2023-11-12 DIAGNOSIS — M5410 Radiculopathy, site unspecified: Secondary | ICD-10-CM | POA: Insufficient documentation

## 2023-11-12 DIAGNOSIS — F0390 Unspecified dementia without behavioral disturbance: Secondary | ICD-10-CM | POA: Diagnosis not present

## 2023-11-12 DIAGNOSIS — M7989 Other specified soft tissue disorders: Secondary | ICD-10-CM | POA: Diagnosis not present

## 2023-11-12 DIAGNOSIS — R601 Generalized edema: Principal | ICD-10-CM

## 2023-11-12 DIAGNOSIS — E11649 Type 2 diabetes mellitus with hypoglycemia without coma: Secondary | ICD-10-CM | POA: Diagnosis not present

## 2023-11-12 DIAGNOSIS — I130 Hypertensive heart and chronic kidney disease with heart failure and stage 1 through stage 4 chronic kidney disease, or unspecified chronic kidney disease: Secondary | ICD-10-CM | POA: Diagnosis not present

## 2023-11-12 DIAGNOSIS — J811 Chronic pulmonary edema: Secondary | ICD-10-CM | POA: Diagnosis not present

## 2023-11-12 DIAGNOSIS — E1122 Type 2 diabetes mellitus with diabetic chronic kidney disease: Secondary | ICD-10-CM | POA: Insufficient documentation

## 2023-11-12 DIAGNOSIS — Z794 Long term (current) use of insulin: Secondary | ICD-10-CM | POA: Insufficient documentation

## 2023-11-12 DIAGNOSIS — I5030 Unspecified diastolic (congestive) heart failure: Secondary | ICD-10-CM | POA: Diagnosis not present

## 2023-11-12 DIAGNOSIS — Z79899 Other long term (current) drug therapy: Secondary | ICD-10-CM | POA: Diagnosis not present

## 2023-11-12 DIAGNOSIS — E785 Hyperlipidemia, unspecified: Secondary | ICD-10-CM | POA: Insufficient documentation

## 2023-11-12 DIAGNOSIS — R0602 Shortness of breath: Secondary | ICD-10-CM | POA: Diagnosis not present

## 2023-11-12 DIAGNOSIS — I152 Hypertension secondary to endocrine disorders: Secondary | ICD-10-CM | POA: Diagnosis present

## 2023-11-12 DIAGNOSIS — N179 Acute kidney failure, unspecified: Secondary | ICD-10-CM | POA: Diagnosis not present

## 2023-11-12 LAB — CBG MONITORING, ED
Glucose-Capillary: 49 mg/dL — ABNORMAL LOW (ref 70–99)
Glucose-Capillary: 209 mg/dL — ABNORMAL HIGH (ref 70–99)

## 2023-11-12 LAB — BASIC METABOLIC PANEL WITH GFR
Anion gap: 15 (ref 5–15)
Anion gap: 14 (ref 5–15)
BUN: 25 mg/dL — ABNORMAL HIGH (ref 8–23)
BUN: 23 mg/dL (ref 8–23)
CO2: 21 mmol/L — ABNORMAL LOW (ref 22–32)
CO2: 27 mmol/L (ref 22–32)
Calcium: 9.7 mg/dL (ref 8.9–10.3)
Calcium: 9.6 mg/dL (ref 8.9–10.3)
Chloride: 100 mmol/L (ref 98–111)
Chloride: 99 mmol/L (ref 98–111)
Creatinine, Ser: 1.91 mg/dL — ABNORMAL HIGH (ref 0.44–1.00)
Creatinine, Ser: 1.83 mg/dL — ABNORMAL HIGH (ref 0.44–1.00)
GFR, Estimated: 25 mL/min — ABNORMAL LOW (ref >60)
GFR, Estimated: 27 mL/min — ABNORMAL LOW (ref >60)
Glucose, Bld: 194 mg/dL — ABNORMAL HIGH (ref 70–99)
Glucose, Bld: 153 mg/dL — ABNORMAL HIGH (ref 70–99)
Potassium: 5.4 mmol/L — ABNORMAL HIGH (ref 3.5–5.1)
Potassium: 4.3 mmol/L (ref 3.5–5.1)
Sodium: 135 mmol/L (ref 135–145)
Sodium: 140 mmol/L (ref 135–145)

## 2023-11-12 LAB — CBC
HCT: 38.1 % (ref 36.0–46.0)
Hemoglobin: 12.2 g/dL (ref 12.0–15.0)
MCH: 31.6 pg (ref 26.0–34.0)
MCHC: 32 g/dL (ref 30.0–36.0)
MCV: 98.7 fL (ref 80.0–100.0)
Platelets: 214 K/uL (ref 150–400)
RBC: 3.86 MIL/uL — ABNORMAL LOW (ref 3.87–5.11)
RDW: 12.8 % (ref 11.5–15.5)
WBC: 5.9 K/uL (ref 4.0–10.5)
nRBC: 0 % (ref 0.0–0.2)

## 2023-11-12 LAB — GLUCOSE, CAPILLARY
Glucose-Capillary: 117 mg/dL — ABNORMAL HIGH (ref 70–99)
Glucose-Capillary: 87 mg/dL (ref 70–99)
Glucose-Capillary: 139 mg/dL — ABNORMAL HIGH (ref 70–99)
Glucose-Capillary: 183 mg/dL — ABNORMAL HIGH (ref 70–99)

## 2023-11-12 LAB — PRO BRAIN NATRIURETIC PEPTIDE: Pro Brain Natriuretic Peptide: 148 pg/mL (ref <300.0)

## 2023-11-12 MED ORDER — HEPARIN SODIUM (PORCINE) 5000 UNIT/ML IJ SOLN
5000.0000 [IU] | Freq: Three times a day (TID) | INTRAMUSCULAR | Status: DC
  Administered 2023-11-12 – 2023-11-13 (×4): 5000 [IU] via SUBCUTANEOUS
  Filled 2023-11-12 (×5): qty 1

## 2023-11-12 MED ORDER — VITAMIN D3 25 MCG (1000 UNIT) PO TABS
2000.0000 [IU] | ORAL_TABLET | Freq: Every day | ORAL | Status: DC
  Administered 2023-11-13: 2000 [IU] via ORAL
  Filled 2023-11-12: qty 2

## 2023-11-12 MED ORDER — CARVEDILOL 3.125 MG PO TABS
6.2500 mg | ORAL_TABLET | Freq: Two times a day (BID) | ORAL | Status: DC
  Administered 2023-11-12 – 2023-11-13 (×3): 6.25 mg via ORAL
  Filled 2023-11-12 (×3): qty 2

## 2023-11-12 MED ORDER — DONEPEZIL HCL 5 MG PO TABS
10.0000 mg | ORAL_TABLET | Freq: Every day | ORAL | Status: DC
  Administered 2023-11-12: 10 mg via ORAL
  Filled 2023-11-12: qty 2

## 2023-11-12 MED ORDER — DEXTROSE 50 % IV SOLN
INTRAVENOUS | Status: AC
  Administered 2023-11-12: 50 mL via INTRAVENOUS
  Filled 2023-11-12: qty 50

## 2023-11-12 MED ORDER — ALBUTEROL SULFATE (2.5 MG/3ML) 0.083% IN NEBU: 2.5000 mg | INHALATION_SOLUTION | RESPIRATORY_TRACT | Status: DC | PRN

## 2023-11-12 MED ORDER — GABAPENTIN 300 MG PO CAPS
300.0000 mg | ORAL_CAPSULE | Freq: Every day | ORAL | Status: DC
  Administered 2023-11-12: 300 mg via ORAL
  Filled 2023-11-12: qty 1

## 2023-11-12 MED ORDER — INSULIN ASPART 100 UNIT/ML IJ SOLN
0.0000 [IU] | Freq: Three times a day (TID) | INTRAMUSCULAR | Status: DC
  Administered 2023-11-12: 2 [IU] via SUBCUTANEOUS
  Administered 2023-11-13: 3 [IU] via SUBCUTANEOUS
  Filled 2023-11-12: qty 0.15

## 2023-11-12 MED ORDER — CENTRUM SILVER ADULT 50+ PO TABS: ORAL_TABLET | Freq: Every day | ORAL | Status: DC

## 2023-11-12 MED ORDER — TRAZODONE HCL 50 MG PO TABS: 25.0000 mg | ORAL_TABLET | Freq: Every evening | ORAL | Status: DC | PRN

## 2023-11-12 MED ORDER — ASPIRIN 81 MG PO TBEC
81.0000 mg | DELAYED_RELEASE_TABLET | Freq: Every day | ORAL | Status: DC
Start: 2023-11-12 — End: 2023-11-13
  Administered 2023-11-12: 81 mg via ORAL
  Filled 2023-11-12: qty 1

## 2023-11-12 MED ORDER — HYDRALAZINE HCL 50 MG PO TABS
50.0000 mg | ORAL_TABLET | Freq: Two times a day (BID) | ORAL | Status: DC
Start: 2023-11-12 — End: 2023-11-13
  Administered 2023-11-12 – 2023-11-13 (×2): 50 mg via ORAL
  Filled 2023-11-12 (×2): qty 1

## 2023-11-12 MED ORDER — ATORVASTATIN CALCIUM 10 MG PO TABS
10.0000 mg | ORAL_TABLET | Freq: Every morning | ORAL | Status: DC
  Administered 2023-11-12 – 2023-11-13 (×2): 10 mg via ORAL
  Filled 2023-11-12 (×2): qty 1

## 2023-11-12 MED ORDER — FUROSEMIDE 10 MG/ML IJ SOLN
40.0000 mg | Freq: Once | INTRAMUSCULAR | Status: AC
  Administered 2023-11-12: 40 mg via INTRAVENOUS
  Filled 2023-11-12 (×2): qty 4

## 2023-11-12 MED ORDER — ADULT MULTIVITAMIN W/MINERALS CH
1.0000 | ORAL_TABLET | Freq: Every day | ORAL | Status: DC
  Administered 2023-11-12 – 2023-11-13 (×2): 1 via ORAL
  Filled 2023-11-12 (×2): qty 1

## 2023-11-12 MED ORDER — DEXTROSE 50 % IV SOLN
50.0000 mL | Freq: Once | INTRAVENOUS | Status: AC
  Administered 2023-11-12: 50 mL via INTRAVENOUS

## 2023-11-12 MED ORDER — AMLODIPINE BESYLATE 5 MG PO TABS
10.0000 mg | ORAL_TABLET | Freq: Every day | ORAL | Status: DC
  Administered 2023-11-12 – 2023-11-13 (×2): 10 mg via ORAL
  Filled 2023-11-12 (×2): qty 2

## 2023-11-12 MED ORDER — ONDANSETRON HCL 4 MG/2ML IJ SOLN: 4.0000 mg | Freq: Four times a day (QID) | INTRAMUSCULAR | Status: DC | PRN

## 2023-11-12 MED ORDER — ACETAMINOPHEN 650 MG RE SUPP: 650.0000 mg | Freq: Four times a day (QID) | RECTAL | Status: DC | PRN

## 2023-11-12 MED ORDER — INSULIN DEGLUDEC 200 UNIT/ML ~~LOC~~ SOPN: 30.0000 [IU] | PEN_INJECTOR | Freq: Every day | SUBCUTANEOUS | Status: DC

## 2023-11-12 MED ORDER — ACETAMINOPHEN 325 MG PO TABS
650.0000 mg | ORAL_TABLET | Freq: Four times a day (QID) | ORAL | Status: DC | PRN
  Administered 2023-11-12: 650 mg via ORAL
  Filled 2023-11-12: qty 2

## 2023-11-12 MED ORDER — ONDANSETRON HCL 4 MG PO TABS: 4.0000 mg | ORAL_TABLET | Freq: Four times a day (QID) | ORAL | Status: DC | PRN

## 2023-11-12 MED ORDER — HYDROCODONE-ACETAMINOPHEN 5-325 MG PO TABS
1.0000 | ORAL_TABLET | ORAL | Status: DC | PRN
  Administered 2023-11-12: 1 via ORAL
  Filled 2023-11-12: qty 1

## 2023-11-12 MED ORDER — SODIUM BICARBONATE 8.4 % IV SOLN
50.0000 meq | Freq: Once | INTRAVENOUS | Status: AC
  Administered 2023-11-12: 50 meq via INTRAVENOUS
  Filled 2023-11-12: qty 50

## 2023-11-12 MED ORDER — ALBUTEROL SULFATE (2.5 MG/3ML) 0.083% IN NEBU
2.5000 mg | INHALATION_SOLUTION | Freq: Once | RESPIRATORY_TRACT | Status: AC
  Administered 2023-11-12: 2.5 mg via RESPIRATORY_TRACT
  Filled 2023-11-12: qty 3

## 2023-11-12 MED ORDER — HYDROCODONE-ACETAMINOPHEN 5-325 MG PO TABS
1.0000 | ORAL_TABLET | Freq: Once | ORAL | Status: AC
  Administered 2023-11-12: 1 via ORAL
  Filled 2023-11-12: qty 1

## 2023-11-12 MED ORDER — INSULIN GLARGINE 100 UNIT/ML ~~LOC~~ SOLN
30.0000 [IU] | Freq: Every day | SUBCUTANEOUS | Status: DC
  Administered 2023-11-12 – 2023-11-13 (×2): 30 [IU] via SUBCUTANEOUS
  Filled 2023-11-12 (×2): qty 0.3

## 2023-11-12 MED ORDER — INSULIN ASPART 100 UNIT/ML IJ SOLN
0.0000 [IU] | Freq: Every day | INTRAMUSCULAR | Status: DC
  Filled 2023-11-12: qty 0.05

## 2023-11-12 MED ORDER — SODIUM ZIRCONIUM CYCLOSILICATE 10 G PO PACK
10.0000 g | PACK | ORAL | Status: AC
  Administered 2023-11-12: 10 g via ORAL
  Filled 2023-11-12: qty 1

## 2023-11-12 MED ORDER — HYDROCODONE-ACETAMINOPHEN 5-325 MG PO TABS
1.0000 | ORAL_TABLET | ORAL | Status: DC | PRN
  Administered 2023-11-12: 2 via ORAL
  Administered 2023-11-13: 1 via ORAL
  Administered 2023-11-13: 2 via ORAL
  Filled 2023-11-12 (×3): qty 2

## 2023-11-12 MED ORDER — HYDROCODONE-ACETAMINOPHEN 5-325 MG PO TABS
0.5000 | ORAL_TABLET | ORAL | Status: DC
Start: 2023-11-12 — End: 2023-11-12

## 2023-11-12 NOTE — Evaluation (Signed)
 Physical Therapy Evaluation Patient Details Name: [name] MRN: [id number] DOB: June 09, 1940 Today's Date: 11/12/2023  History of Present Illness  83 yo female presents to therapy following hospital admission on 11/12/2023 due to progressive B LE edema and pain starting 9/21. Pt found to have abn labs. Pt admitted to hospital in August due to dizziness. Pt PMH includes but is not limited to: SM II, CKD IV, renal ca s/p nephrectomy, HTN, HLD, vertigo, and spine surgery.  Clinical Impression      Pt admitted with above diagnosis.  Pt currently with functional limitations due to the deficits listed below (see PT Problem List). Pt in bed when therapist arrived. Pt agreeable to therapy intervention. Nurse provided pt with pain medication. Pt indicates B LE pain R > L and states that she went to the orthopedic and there is something loose in her hip or back, pt states she was trying to use a back brace from a neighbor prior to admission, brace is not at hospital. Pt required CGA for supine to sit with increased time and use of hospital bed, pt required CGA for sit to stand  from EOB and required encouragement for gait assessment due to B LE pain, pt able to ambulate 30 feet with RW, min cues and CGA. Pt left seated in recliner, all needs in place and set up for lunch. Pt reports limited assist in home setting, recently completing [facility] services and daughter lives out of town. Therapist encouraged pt to possibly seek some caregiver assist upon d/c home. Pt will benefit from acute skilled PT to increase their independence and safety with mobility to allow discharge.       If plan is discharge home, recommend the following: A little help with walking and/or transfers;A little help with bathing/dressing/bathroom;Assistance with cooking/housework;Assist for transportation;Help with stairs or ramp for entrance   Can travel by private vehicle        Equipment Recommendations None recommended by PT   Recommendations for Other Services       Functional Status Assessment Patient has had a recent decline in their functional status and demonstrates the ability to make significant improvements in function in a reasonable and predictable amount of time.     Precautions / Restrictions Precautions Precautions: Fall Restrictions Weight Bearing Restrictions Per Provider Order: No      Mobility  Bed Mobility Overal bed mobility: Needs Assistance Bed Mobility: Supine to Sit     Supine to sit: Contact guard, HOB elevated     General bed mobility comments: min cues increased time    Transfers Overall transfer level: Needs assistance Equipment used: Rolling walker (2 wheels) Transfers: Sit to/from Stand Sit to Stand: Contact guard assist           General transfer comment: min cues    Ambulation/Gait Ambulation/Gait assistance: Contact guard assist Gait Distance (Feet): 30 Feet Assistive device: Rolling walker (2 wheels) Gait Pattern/deviations: Step-to pattern, Antalgic, Trunk flexed, Decreased stance time - right Gait velocity: decreased     General Gait Details: min cues for encouragement, step almost through pattern with  slight trunk flexion with B UE support at RW to offload R LE in stance phase, pt reporting R LE hurting worse than the L  Stairs            Wheelchair Mobility     Tilt Bed    Modified Rankin (Stroke Patients Only)       Balance Overall balance assessment: Needs assistance Sitting-balance support:  Feet supported Sitting balance-Leahy Scale: Good     Standing balance support: Reliant on assistive device for balance, During functional activity, Bilateral upper extremity supported Standing balance-Leahy Scale: Poor                               Pertinent Vitals/Pain Pain Assessment Pain Assessment: 0-10 Pain Score: 7  Pain Location: B LE R > L Pain Descriptors / Indicators: Aching, Burning, Constant,  Discomfort Pain Intervention(s): Limited activity within patient's tolerance, Monitored during session, Premedicated before session, Repositioned    Home Living Family/patient expects to be discharged to:: Private residence Living Arrangements: Alone Available Help at Discharge: Other (Comment) (pt reports no assist) Type of Home: House Home Access: Stairs to enter Entrance Stairs-Rails: None Entrance Stairs-Number of Steps: 3 Alternate Level Stairs-Number of Steps: flight Home Layout: Two level Home Equipment: Agricultural consultant (2 wheels);Shower seat Additional Comments: reacher    Prior Function Prior Level of Function : Independent/Modified Independent;Driving             Mobility Comments: mod I with intermittent use of RW, no AD for community navigaiton ADLs Comments: mod I for all bathing, dressing, medication management, light house work, take out for food a majority of the time     Extremity/Trunk Assessment        Lower Extremity Assessment Lower Extremity Assessment: Generalized weakness (noted edema and pain)    Cervical / Trunk Assessment Cervical / Trunk Assessment: Back Surgery  Communication   Communication Communication: No apparent difficulties    Cognition Arousal: Alert Behavior During Therapy: WFL for tasks assessed/performed   PT - Cognitive impairments: No apparent impairments                         Following commands: Intact       Cueing       General Comments      Exercises     Assessment/Plan    PT Assessment Patient needs continued PT services  PT Problem List Decreased strength;Decreased activity tolerance;Decreased balance;Decreased mobility;Decreased coordination;Pain       PT Treatment Interventions DME instruction;Gait training;Stair training;Functional mobility training;Therapeutic activities;Therapeutic exercise;Neuromuscular re-education;Balance training;Patient/family education;Modalities    PT Goals  (Current goals can be found in the Care Plan section)  Acute Rehab PT Goals Patient Stated Goal: to be safe at home PT Goal Formulation: With patient Time For Goal Achievement: 11/27/23 Potential to Achieve Goals: Good    Frequency Min 3X/week     Co-evaluation PT/OT/SLP Co-Evaluation/Treatment: Yes Reason for Co-Treatment: For patient/therapist safety;To address functional/ADL transfers;Complexity of the patient's impairments (multi-system involvement);Necessary to address cognition/behavior during functional activity PT goals addressed during session: Mobility/safety with mobility;Balance;Proper use of DME OT goals addressed during session: ADL's and self-care;Proper use of Adaptive equipment and DME       AM-PAC PT 6 Clicks Mobility  Outcome Measure Help needed turning from your back to your side while in a flat bed without using bedrails?: None Help needed moving from lying on your back to sitting on the side of a flat bed without using bedrails?: A Little Help needed moving to and from a bed to a chair (including a wheelchair)?: A Little Help needed standing up from a chair using your arms (e.g., wheelchair or bedside chair)?: A Little Help needed to walk in hospital room?: A Little Help needed climbing 3-5 steps with a railing? : A Lot 6 Click  Score: 18    End of Session Equipment Utilized During Treatment: Gait belt Activity Tolerance: Patient limited by pain Patient left: in chair;with call bell/phone within reach;with chair alarm set Nurse Communication: Mobility status PT Visit Diagnosis: Unsteadiness on feet (R26.81);Other abnormalities of gait and mobility (R26.89);Muscle weakness (generalized) (M62.81);Difficulty in walking, not elsewhere classified (R26.2);Pain Pain - Right/Left:  (B) Pain - part of body: Leg;Knee;Ankle and joints of foot    Time: 8871-8844 PT Time Calculation (min) (ACUTE ONLY): 27 min   Charges:   PT Evaluation $PT Eval Low Complexity: 1  Low   PT General Charges $$ ACUTE PT VISIT: 1 Visit         [place], PT Acute Rehab   [place] VEAR Drone 11/12/2023, 1:14 PM

## 2023-11-12 NOTE — ED Triage Notes (Signed)
 Pt reports concern for bilateral leg pain that started Sunday. Pain associated with bilateral nonpitting edema. Pain worse in right. Pt is prescribed 20 po lasix .

## 2023-11-12 NOTE — H&P (Addendum)
 History and Physical  [name] FMW:[id number] DOB: 1940-05-06 DOA: 11/12/2023  PCP: Leigh Lung, MD   Chief Complaint: Leg swelling and pain  HPI: [name] is a 83 y.o. female with medical history significant for insulin -dependent type 2 diabetes, CKD stage IV, RCC status post nephrectomy, hypertension, hyperlipidemia being admitted for observation due to lower extremity edema.  Patient states that she takes Lasix  daily lower extremity edema, in the last 3 days it has worsened.  She denies any chest pain, shortness of breath, orthopnea, cough, she has had bilateral ankle and calf swelling.  The swelling makes it uncomfortable and difficult for her to ambulate.  Workup in the ER as detailed below shows evidence of stable renal function, mild hyperkalemia and metabolic acidosis.  Chest x-ray and BNP unremarkable.  Due to the lower extremity edema, she was given a dose of IV Lasix  and hospitalist admission was requested.  On my exam this morning, patient states that her lower extremity edema seems to be marginally improved.  Review of Systems: Please see HPI for pertinent positives and negatives. A complete 10 system review of systems are otherwise negative.  Past Medical History:  Diagnosis Date   ACS (acute coronary syndrome) (HCC) 12/27/2010   AKI (acute kidney injury) 03/25/2018   Anginal pain    Arthritis    Asthma    Diabetes mellitus    Type 1   Dysrhythmia    irregular heart beat   GERD (gastroesophageal reflux disease)    History of urinary tract infection    Hypercholesteremia    Hypertension    left renal ca dx'd 2012 (?)   surg only, left kidney   Nocturia    Reflux    Renal failure (ARF), acute on chronic    Shortness of breath dyspnea    pt denies; states can climb stairs w/o difficulty    Sleep apnea    does not use c-pap machine   Tingling in extremities    legs bilat   Tinnitus    Urinary frequency    Urinary incontinence    Vertigo    occurs when  lying flat    Past Surgical History:  Procedure Laterality Date   ABDOMINAL HYSTERECTOMY     APPENDECTOMY     CARDIAC CATHETERIZATION N/A 07/28/2014   Procedure: Left Heart Cath and Coronary Angiography;  Surgeon: Gordy Bergamo, MD;  Location: [facility] INVASIVE CV LAB;  Service: Cardiovascular;  Laterality: N/A;   CYSTOSCOPY WITH RETROGRADE PYELOGRAM, URETEROSCOPY AND STENT PLACEMENT Right 02/09/2015   Procedure: CYSTOSCOPY WITH RETROGRADE PYELOGRAM AND URETEROSCOPY ;  Surgeon: Gretel Ferrara, MD;  Location: [facility] ORS;  Service: Urology;  Laterality: Right;   LAPAROSCOPIC NEPHRECTOMY Right 03/20/2015   Procedure: LAPAROSCOPIC NEPHRECTOMY;  Surgeon: Gretel Ferrara, MD;  Location: [facility] ORS;  Service: Urology;  Laterality: Right;   PERIPHERAL VASCULAR CATHETERIZATION N/A 07/28/2014   Procedure: Aortic Arch Angiography;  Surgeon: Gordy Bergamo, MD;  Location: [facility] INVASIVE CV LAB;  Service: Cardiovascular;  Laterality: N/A;   RENAL MASS EXCISION     SPINE SURGERY     Social History:  reports that she has never smoked. She has never used smokeless tobacco. She reports that she does not drink alcohol and does not use drugs.  Allergies  Allergen Reactions   Sulfa Antibiotics Itching    Family History  Problem Relation Age of Onset   Hypertension Mother    Coronary artery disease Other    Breast cancer Cousin  Prior to Admission medications   Medication Sig Start Date End Date Taking? Authorizing Provider  acetaminophen  (TYLENOL  8 HOUR) 650 MG CR tablet Take 1 tablet (650 mg total) by mouth every 8 (eight) hours as needed for pain. Patient taking differently: Take 650-1,300 mg by mouth every 8 (eight) hours as needed for pain. 02/18/20   Aberman, Caroline C, PA-C  albuterol  (PROVENTIL ,VENTOLIN ) 90 MCG/ACT inhaler Inhale 2 puffs into the lungs every 4 (four) hours as needed for wheezing or shortness of breath.     [provider]  amLODipine  (NORVASC ) 10 MG tablet Take 1 tablet (10 mg total) by mouth  daily. 09/18/23   Shlomo Wilbert SAUNDERS, MD  aspirin  EC 81 MG tablet Take 81 mg by mouth at bedtime.    [provider]  atorvastatin  (LIPITOR) 10 MG tablet Take 1 tablet (10 mg total) by mouth every morning. 09/18/23   Shlomo Wilbert SAUNDERS, MD  carvedilol  (COREG ) 6.25 MG tablet Take 1 tablet (6.25 mg total) by mouth 2 (two) times daily. 10/23/23   Shlomo Wilbert SAUNDERS, MD  cholecalciferol  (VITAMIN D ) 1000 units tablet Take 2,000 Units by mouth daily.    [provider]  Continuous Glucose Sensor (DEXCOM G7 SENSOR) MISC Use as directed to monitor blood glucose. Change every 10 days. 09/22/23     Continuous Glucose Sensor (DEXCOM G7 SENSOR) MISC Use to check blood sugar as directed. Change every 10 days 09/22/23   Roy Harlene HERO, NP  Continuous Glucose Sensor (DEXCOM G7 SENSOR) MISC Use to check blood sugar as directed. Change every 10 days. 11/06/23     donepezil  (ARICEPT ) 10 MG tablet Take 1 tablet (10 mg total) by mouth daily. 07/23/23     furosemide  (LASIX ) 20 MG tablet Take 1 tablet (20 mg total) by mouth daily for fluid. 11/10/23     gabapentin  (NEURONTIN ) 300 MG capsule Take 1 capsule (300 mg total) by mouth in the morning AND 2 capsules (600 mg total) Nightly. 02/11/23     gabapentin  (NEURONTIN ) 300 MG capsule Take 1 capsule (300 mg total) by mouth at bedtime. 11/10/23     Glucagon  (BAQSIMI  TWO PACK) 3 MG/DOSE POWD Use as needed nasally for severe hypoglycemia Patient not taking: Reported on 10/03/2023 05/01/23     Glucagon  (BAQSIMI  TWO PACK) 3 MG/DOSE POWD Use as needed for severe hypoglycemia Nasally 11/11/23     hydrALAZINE  (APRESOLINE ) 50 MG tablet Take 1 tablet (50 mg total) by mouth 2 (two) times daily. 01/13/23   Shlomo Wilbert SAUNDERS, MD  HYDROcodone -acetaminophen  (NORCO/VICODIN) 5-325 MG tablet Take 0.5 tablets by mouth every 4-6 hours as needed for pain 09/08/23     HYDROcodone -acetaminophen  (NORCO/VICODIN) 5-325 MG tablet Take 0.5 tablets by mouth every 6 (six) to 8 (eight) hours as needed. 10/21/23      insulin  aspart (NOVOLOG  FLEXPEN) 100 UNIT/ML FlexPen Inject 1-2 Units into the skin 2 (two) times daily before lunch and supper. 03/03/23     insulin  aspart (NOVOLOG  FLEXPEN) 100 UNIT/ML FlexPen Inject 1-2 Units into the skin twice daily before lunch and supper. Discard 28 days after first use. 10/06/23     insulin  aspart (NOVOLOG  FLEXPEN) 100 UNIT/ML FlexPen Inject 1-2 units subcutaneously twice daily -before lunch and supper 10/15/23     insulin  degludec (TRESIBA  FLEXTOUCH) 200 UNIT/ML FlexTouch Pen Inject 70 Units into the skin daily. Patient taking differently: Inject 40 Units into the skin daily. 02/18/23     meclizine  (ANTIVERT ) 12.5 MG tablet Take 1 tablet (12.5 mg total) by  mouth 3 (three) times daily as needed for dizziness. Patient not taking: Reported on 10/03/2023 06/02/23   Armenta Canning, MD  Multiple Vitamins-Minerals (CENTRUM SILVER  ADULT 50+ PO) Take 1 tablet by mouth daily.    [provider]    Physical Exam: BP (!) 170/67 (BP Location: Right Arm)   Pulse (!) 56   Temp 97.7 F (36.5 C) (Oral)   Resp 17   SpO2 97%  General:  Alert, orientedx4, good historian, calm, in no acute distress, resting comfortably on room air Eyes: EOMI, clear conjuctivae, white sclerea Neck: supple, no masses, trachea mildline  Cardiovascular: RRR, no murmurs or rubs, she has trace lower extremity edema, left greater than right Respiratory: clear to auscultation bilaterally, no wheezes, no crackles  Abdomen: soft, nontender, nondistended, normal bowel tones heard  Skin: dry, no rashes  Musculoskeletal: no joint effusions, normal range of motion  Psychiatric: appropriate affect, normal speech  Neurologic: extraocular muscles intact, clear speech, moving all extremities with intact sensorium         Labs on Admission:  Basic Metabolic Panel: Recent Labs  Lab 11/12/23 0032  NA 135  K 5.4*  CL 100  CO2 21*  GLUCOSE 194*  BUN 25*  CREATININE 1.91*  CALCIUM  9.7   Liver Function  Tests: No results for input(s): AST, ALT, ALKPHOS, BILITOT, PROT, ALBUMIN in the last 168 hours. No results for input(s): LIPASE, AMYLASE in the last 168 hours. No results for input(s): AMMONIA in the last 168 hours. CBC: Recent Labs  Lab 11/12/23 0032  WBC 5.9  HGB 12.2  HCT 38.1  MCV 98.7  PLT 214   Cardiac Enzymes: No results for input(s): CKTOTAL, CKMB, CKMBINDEX, TROPONINI in the last 168 hours. BNP (last 3 results) Recent Labs    06/01/23 2318  BNP 49.2    ProBNP (last 3 results) Recent Labs    11/12/23 0032  PROBNP 148.0    CBG: Recent Labs  Lab 11/12/23 0609 11/12/23 0638  GLUCAP 49* 209*    Radiological Exams on Admission: DG Chest Portable 1 View Result Date: 11/12/2023 EXAM: 1 VIEW(S) XRAY OF THE CHEST 11/12/2023 04:46:00 AM COMPARISON: 10/02/2023 CLINICAL HISTORY: edema. Legs swelling, shortness of breath FINDINGS: LUNGS AND PLEURA: No focal pulmonary opacity. No pulmonary edema. No pleural effusion. No pneumothorax. HEART AND MEDIASTINUM: Atherosclerotic plaque. No acute abnormality of the cardiac and mediastinal silhouettes. BONES AND SOFT TISSUES: Right humeral head rotator cuff anchor suture. Cervical spine surgical hardware. No acute osseous abnormality. IMPRESSION: 1. No acute process. Electronically signed by: Evalene Coho MD 11/12/2023 05:02 AM EDT RP Workstation: [id number]   Assessment/Plan [name] is a 83 y.o. female with medical history significant for insulin -dependent type 2 diabetes, CKD stage IV, RCC status post nephrectomy, hypertension, hyperlipidemia being admitted for observation due to lower extremity edema.  Lower extremity edema-likely due to age-related mild venous insufficiency, patient does take low-dose diuretic daily.  Improving with diuresis already, doubt infection, DVT, heart failure etc.  Received IV Lasix  in the emergency department. -Observation admission -TED hose -Elevate legs when at  rest or sleeping -PT evaluation  CKD stage IV-creatinine baseline seems to range from 1.4-2.2, renal function appears to be at baseline  Grade 1 diastolic dysfunction-continue Coreg , will plan to resume daily Lasix  in the morning, consider increasing from 20 mg daily at discharge  Hyperkalemia-mild, without EKG changes.  Given a dose of Lokelma  in the emergency department Metabolic acidosis-due to CKD stage IV, received a dose of IV bicarb -  Will plan to recheck BMP in the morning  Insulin -dependent type 2 diabetes-last hemoglobin A1c 7.22 September 2023 -Carb modified heart healthy diet -Continue Tresiba  at reduced dose of 30 units/day (patient states she takes 40 units/day), no Tresiba  was held during her last hospitalization for unclear reasons -Moderate dose sliding scale  Hyperlipidemia-Lipitor  Hypertension-continue home amlodipine   Dementia-continue Aricept   DVT prophylaxis: Subcutaneous heparin     Code Status: Full Code  Consults called: None  Admission status: Observation  Time spent: 55 minutes  Manasa Spease CHRISTELLA Gail MD [facility] Pager [phone]  If 7PM-7AM, please contact night-coverage [email or web address] Password [email or web address]  11/12/2023, 7:46 AM

## 2023-11-12 NOTE — ED Provider Notes (Signed)
 [place] EMERGENCY DEPARTMENT AT [facility] Provider Note   CSN: [id number] Arrival date & time: 11/12/23  0010     Patient presents with: Leg Swelling   [name] is a 83 y.o. female.   The history is provided by the patient.  Leg Pain Location:  Leg Time since incident:  3 days Injury: no   Leg location:  L leg and R leg Pain details:    Radiates to:  Does not radiate   Severity:  Moderate   Onset quality:  Gradual   Timing:  Constant   Progression:  Unchanged      Prior to Admission medications   Medication Sig Start Date End Date Taking? Authorizing Provider  acetaminophen  (TYLENOL  8 HOUR) 650 MG CR tablet Take 1 tablet (650 mg total) by mouth every 8 (eight) hours as needed for pain. Patient taking differently: Take 650-1,300 mg by mouth every 8 (eight) hours as needed for pain. 02/18/20   Aberman, Caroline C, PA-C  albuterol  (PROVENTIL ,VENTOLIN ) 90 MCG/ACT inhaler Inhale 2 puffs into the lungs every 4 (four) hours as needed for wheezing or shortness of breath.     [provider]  amLODipine  (NORVASC ) 10 MG tablet Take 1 tablet (10 mg total) by mouth daily. 09/18/23   Bihari Wilbert SAUNDERS, MD  aspirin  EC 81 MG tablet Take 81 mg by mouth at bedtime.    [provider]  atorvastatin  (LIPITOR) 10 MG tablet Take 1 tablet (10 mg total) by mouth every morning. 09/18/23   Vonbehren, Wilbert SAUNDERS, MD  carvedilol  (COREG ) 6.25 MG tablet Take 1 tablet (6.25 mg total) by mouth 2 (two) times daily. 10/23/23   Bihari Wilbert SAUNDERS, MD  cholecalciferol  (VITAMIN D ) 1000 units tablet Take 2,000 Units by mouth daily.    [provider]  Continuous Glucose Sensor (DEXCOM G7 SENSOR) MISC Use as directed to monitor blood glucose. Change every 10 days. 09/22/23     Continuous Glucose Sensor (DEXCOM G7 SENSOR) MISC Use to check blood sugar as directed. Change every 10 days 09/22/23   Roy Harlene HERO, NP  Continuous Glucose Sensor (DEXCOM G7 SENSOR) MISC Use to check  blood sugar as directed. Change every 10 days. 11/06/23     donepezil  (ARICEPT ) 10 MG tablet Take 1 tablet (10 mg total) by mouth daily. 07/23/23     furosemide  (LASIX ) 20 MG tablet Take 1 tablet (20 mg total) by mouth daily for fluid. 11/10/23     gabapentin  (NEURONTIN ) 300 MG capsule Take 1 capsule (300 mg total) by mouth in the morning AND 2 capsules (600 mg total) Nightly. 02/11/23     gabapentin  (NEURONTIN ) 300 MG capsule Take 1 capsule (300 mg total) by mouth at bedtime. 11/10/23     Glucagon  (BAQSIMI  TWO PACK) 3 MG/DOSE POWD Use as needed nasally for severe hypoglycemia Patient not taking: Reported on 10/03/2023 05/01/23     Glucagon  (BAQSIMI  TWO PACK) 3 MG/DOSE POWD Use as needed for severe hypoglycemia Nasally 11/11/23     hydrALAZINE  (APRESOLINE ) 50 MG tablet Take 1 tablet (50 mg total) by mouth 2 (two) times daily. 01/13/23   Bihari Wilbert SAUNDERS, MD  HYDROcodone -acetaminophen  (NORCO/VICODIN) 5-325 MG tablet Take 0.5 tablets by mouth every 4-6 hours as needed for pain 09/08/23     HYDROcodone -acetaminophen  (NORCO/VICODIN) 5-325 MG tablet Take 0.5 tablets by mouth every 6 (six) to 8 (eight) hours as needed. 10/21/23     insulin  aspart (NOVOLOG  FLEXPEN) 100 UNIT/ML FlexPen Inject 1-2 Units into  the skin 2 (two) times daily before lunch and supper. 03/03/23     insulin  aspart (NOVOLOG  FLEXPEN) 100 UNIT/ML FlexPen Inject 1-2 Units into the skin twice daily before lunch and supper. Discard 28 days after first use. 10/06/23     insulin  aspart (NOVOLOG  FLEXPEN) 100 UNIT/ML FlexPen Inject 1-2 units subcutaneously twice daily -before lunch and supper 10/15/23     insulin  degludec (TRESIBA  FLEXTOUCH) 200 UNIT/ML FlexTouch Pen Inject 70 Units into the skin daily. Patient taking differently: Inject 40 Units into the skin daily. 02/18/23     meclizine  (ANTIVERT ) 12.5 MG tablet Take 1 tablet (12.5 mg total) by mouth 3 (three) times daily as needed for dizziness. Patient not taking: Reported on 10/03/2023 06/02/23    Armenta Canning, MD  Multiple Vitamins-Minerals (CENTRUM SILVER  ADULT 50+ PO) Take 1 tablet by mouth daily.    [provider]    Allergies: Sulfa antibiotics    Review of Systems  Updated Vital Signs BP (!) 150/104   Pulse 64   Temp 97.9 F (36.6 C) (Oral)   Resp 15   SpO2 96%   Physical Exam Vitals and nursing note reviewed.  Constitutional:      General: She is not in acute distress.    Appearance: Normal appearance. She is well-developed.  HENT:     Head: Normocephalic and atraumatic.     Nose: Nose normal.  Eyes:     Pupils: Pupils are equal, round, and reactive to light.  Cardiovascular:     Rate and Rhythm: Normal rate and regular rhythm.     Pulses: Normal pulses.     Heart sounds: Normal heart sounds.  Pulmonary:     Effort: Pulmonary effort is normal. No respiratory distress.     Breath sounds: Wheezing and rhonchi present.  Abdominal:     General: Bowel sounds are normal. There is no distension.     Palpations: Abdomen is soft.     Tenderness: There is no abdominal tenderness. There is no guarding or rebound.  Musculoskeletal:        General: Normal range of motion.     Cervical back: Normal range of motion and neck supple.  Skin:    General: Skin is warm and dry.     Capillary Refill: Capillary refill takes less than 2 seconds.     Findings: No erythema or rash.  Neurological:     General: No focal deficit present.     Mental Status: She is alert.     Deep Tendon Reflexes: Reflexes normal.  Psychiatric:        Mood and Affect: Mood normal.     (all labs ordered are listed, but only abnormal results are displayed) Results for orders placed or performed during the hospital encounter of 11/12/23  Basic metabolic panel   Collection Time: 11/12/23 12:32 AM  Result Value Ref Range   Sodium 135 135 - 145 mmol/L   Potassium 5.4 (H) 3.5 - 5.1 mmol/L   Chloride 100 98 - 111 mmol/L   CO2 21 (L) 22 - 32 mmol/L   Glucose, Bld 194 (H) 70 - 99 mg/dL    BUN 25 (H) 8 - 23 mg/dL   Creatinine, Ser 8.08 (H) 0.44 - 1.00 mg/dL   Calcium  9.7 8.9 - 10.3 mg/dL   GFR, Estimated 25 (L) >60 mL/min   Anion gap 15 5 - 15  CBC   Collection Time: 11/12/23 12:32 AM  Result Value Ref Range   WBC 5.9 4.0 -  10.5 K/uL   RBC 3.86 (L) 3.87 - 5.11 MIL/uL   Hemoglobin 12.2 12.0 - 15.0 g/dL   HCT 61.8 63.9 - 53.9 %   MCV 98.7 80.0 - 100.0 fL   MCH 31.6 26.0 - 34.0 pg   MCHC 32.0 30.0 - 36.0 g/dL   RDW 87.1 88.4 - 84.4 %   Platelets 214 150 - 400 K/uL   nRBC 0.0 0.0 - 0.2 %  Pro Brain natriuretic peptide   Collection Time: 11/12/23 12:32 AM  Result Value Ref Range   Pro Brain Natriuretic Peptide 148.0 <300.0 pg/mL   DG Chest Portable 1 View Result Date: 11/12/2023 EXAM: 1 VIEW(S) XRAY OF THE CHEST 11/12/2023 04:46:00 AM COMPARISON: 10/02/2023 CLINICAL HISTORY: edema. Legs swelling, shortness of breath FINDINGS: LUNGS AND PLEURA: No focal pulmonary opacity. No pulmonary edema. No pleural effusion. No pneumothorax. HEART AND MEDIASTINUM: Atherosclerotic plaque. No acute abnormality of the cardiac and mediastinal silhouettes. BONES AND SOFT TISSUES: Right humeral head rotator cuff anchor suture. Cervical spine surgical hardware. No acute osseous abnormality. IMPRESSION: 1. No acute process. Electronically signed by: Evalene Coho MD 11/12/2023 05:02 AM EDT RP Workstation: [id number]    EKG: EKG Interpretation Date/Time:  Wednesday November 12 2023 04:40:35 EDT Ventricular Rate:  61 PR Interval:    QRS Duration:  128 QT Interval:  447 QTC Calculation: 443 R Axis:   83  Text Interpretation: Normal sinus rhythm ST elevation, consider inferior injury  Confirmed by Nettie, Alton Bouknight ([id number]) on 11/12/2023 5:10:33 AM  Radiology: [facility] Chest Portable 1 View Result Date: 11/12/2023 EXAM: 1 VIEW(S) XRAY OF THE CHEST 11/12/2023 04:46:00 AM COMPARISON: 10/02/2023 CLINICAL HISTORY: edema. Legs swelling, shortness of breath FINDINGS: LUNGS AND PLEURA: No focal  pulmonary opacity. No pulmonary edema. No pleural effusion. No pneumothorax. HEART AND MEDIASTINUM: Atherosclerotic plaque. No acute abnormality of the cardiac and mediastinal silhouettes. BONES AND SOFT TISSUES: Right humeral head rotator cuff anchor suture. Cervical spine surgical hardware. No acute osseous abnormality. IMPRESSION: 1. No acute process. Electronically signed by: Evalene Coho MD 11/12/2023 05:02 AM EDT RP Workstation: [id number]     Procedures   Medications Ordered in the ED  furosemide  (LASIX ) injection 40 mg (has no administration in time range)                                    Medical Decision Making Patient with leg swelling in BLE   Amount and/or Complexity of Data Reviewed External Data Reviewed: notes.    Details: Previous notes reviewed  Labs: ordered.    Details: Normal white count 5.9, normal hemoglobin 12.3, platelets normal. Normal sodium 135, elevated potassium 5.4, elevated creatinine 1.9  Radiology: ordered and independent interpretation performed.    Details: No pNA ECG/medicine tests: ordered and independent interpretation performed. Decision-making details documented in ED Course.  Risk Prescription drug management. Decision regarding hospitalization.    Final diagnoses:  None    The patient appears reasonably stabilized for admission considering the current resources, flow, and capabilities available in the ED at this time, and I doubt any other [facility] requiring further screening and/or treatment in the ED prior to admission.  ED Discharge Orders     None          Clemmie Buelna, MD 11/12/23 [phone]

## 2023-11-12 NOTE — ED Notes (Signed)
 Save blue tube in main lab

## 2023-11-12 NOTE — Evaluation (Signed)
 Occupational Therapy Evaluation Patient Details Name: [name] MRN: [id number] DOB: 1940/04/02 Today's Date: 11/12/2023   History of Present Illness   83 yo female presents to therapy following hospital admission on 11/12/2023 due to progressive B LE edema and pain starting 9/21. Pt found to have abn labs. Pt admitted to hospital in August due to dizziness. Pt PMH includes but is not limited to: SM II, CKD IV, renal ca s/p nephrectomy, HTN, HLD, vertigo, and spine surgery.     Clinical Impressions PTA, patient lives home alone and had just completed [facility] services due to back/LE pain last week. Patient reports she drives, uses rollator for amb and shower seat but only has support from limited friends and daughter who lives out of town.  Currently, patient presents with deficits outlined below (see OT Problem List for details) most significantly pain, decreased activity tolerance, balance, higher level cognitive deficits limiting BADL's and functional mobility. Patient reports having been issued a back brace but is not in hospital. Patient requires continued Acute care hospital level OT services to progress safety and functional performance and allow for discharge with therapy encouraging patient to seek additional assist in home if able and follow up with [facility] OT services post Acute hospital discharge.       If plan is discharge home, recommend the following:   A lot of help with walking and/or transfers;A lot of help with bathing/dressing/bathroom;Assistance with cooking/housework;Assist for transportation;Help with stairs or ramp for entrance     Functional Status Assessment   Patient has had a recent decline in their functional status and demonstrates the ability to make significant improvements in function in a reasonable and predictable amount of time.     Equipment Recommendations   BSC/3in1 (if no covered, OT will provide resources via handout/online)       Precautions/Restrictions   Precautions Precautions: Fall Restrictions Weight Bearing Restrictions Per Provider Order: No     Mobility Bed Mobility Overal bed mobility: Needs Assistance Bed Mobility: Supine to Sit     Supine to sit: Contact guard, HOB elevated     General bed mobility comments: min cues increased time    Transfers Overall transfer level: Needs assistance Equipment used: Rolling walker (2 wheels) Transfers: Sit to/from Stand Sit to Stand: Contact guard assist           General transfer comment: min cues      Balance Overall balance assessment: Needs assistance Sitting-balance support: Feet supported Sitting balance-Leahy Scale: Good     Standing balance support: Reliant on assistive device for balance, During functional activity, Bilateral upper extremity supported Standing balance-Leahy Scale: Poor                             ADL either performed or assessed with clinical judgement   ADL Overall ADL's : Needs assistance/impaired Eating/Feeding: Set up;Sitting   Grooming: Wash/dry hands;Wash/dry face;Oral care;Sitting;Set up   Upper Body Bathing: Minimal assistance;Sitting   Lower Body Bathing: Moderate assistance;Sit to/from stand   Upper Body Dressing : Set up;Sitting   Lower Body Dressing: Moderate assistance;Sit to/from stand;Cueing for sequencing;Cueing for compensatory techniques   Toilet Transfer: Minimal assistance;Regular Toilet;Rolling walker (2 wheels)   Toileting- Clothing Manipulation and Hygiene: Minimal assistance;Sitting/lateral lean       Functional mobility during ADLs: Contact guard assist;Minimal assistance;Rolling walker (2 wheels) General ADL Comments: back and LE pain limis functional reach, cues for pacing and breathing integration  Vision Baseline Vision/History: 0 No visual deficits;1 Wears glasses              Pertinent Vitals/Pain Pain Assessment Pain Location: B LE R > L Pain  Descriptors / Indicators: Aching, Burning, Constant, Discomfort     Extremity/Trunk Assessment Upper Extremity Assessment Upper Extremity Assessment: Generalized weakness;Right hand dominant   Lower Extremity Assessment Lower Extremity Assessment: Defer to PT evaluation   Cervical / Trunk Assessment Cervical / Trunk Assessment: Back Surgery   Communication Communication Communication: No apparent difficulties   Cognition Arousal: Alert Behavior During Therapy: WFL for tasks assessed/performed Cognition: Cognition impaired   Orientation impairments: Time Awareness: Online awareness impaired Memory impairment (select all impairments): Short-term memory Attention impairment (select first level of impairment): Selective attention Executive functioning impairment (select all impairments): Problem solving OT - Cognition Comments: hiher level increased processing deficits noted                 Following commands: Intact       Cueing  General Comments   Cueing Techniques: Verbal cues  no SOB this sesison, mild +1 B LE edema           Home Living Family/patient expects to be discharged to:: Private residence Living Arrangements: Alone Available Help at Discharge: Other (Comment) (pt reports no assist) Type of Home: House Home Access: Stairs to enter [employer] of Steps: 3 Entrance Stairs-Rails: None Home Layout: Two level Alternate Level Stairs-Number of Steps: flight Alternate Level Stairs-Rails: Right;Left;Can reach both Bathroom Shower/Tub: Producer, television/film/video: Standard Bathroom Accessibility: Yes   Home Equipment: Agricultural consultant (2 wheels);Shower seat   Additional Comments: reacher      Prior Functioning/Environment Prior Level of Function : Independent/Modified Independent;Driving             Mobility Comments: mod I with intermittent use of RW, no AD for community navigaiton ADLs Comments: mod I for all bathing,  dressing, medication management, light house work, take out for food a majority of the time    OT Problem List: Decreased strength;Decreased activity tolerance;Impaired balance (sitting and/or standing);Decreased cognition;Decreased safety awareness;Decreased knowledge of use of DME or AE;Decreased knowledge of precautions;Cardiopulmonary status limiting activity;Pain;Increased edema   OT Treatment/Interventions: Self-care/ADL training;Therapeutic exercise;Energy conservation;Neuromuscular education;DME and/or AE instruction;Therapeutic activities;Cognitive remediation/compensation;Patient/family education;Balance training      OT Goals(Current goals can be found in the care plan section)   Acute Rehab OT Goals Patient Stated Goal: to get stronger OT Goal Formulation: With patient Time For Goal Achievement: 11/26/23 Potential to Achieve Goals: Good ADL Goals Pt Will Perform Lower Body Bathing: with modified independence;sit to/from stand;with adaptive equipment Pt Will Perform Lower Body Dressing: with modified independence;with adaptive equipment;sit to/from stand Pt Will Transfer to Toilet: with supervision;regular height toilet;ambulating Pt Will Perform Toileting - Clothing Manipulation and hygiene: with modified independence;sit to/from stand Pt Will Perform Tub/Shower Transfer: with supervision;rolling walker;shower seat Pt/caregiver will Perform Home Exercise Program: Increased strength;Independently;With written HEP provided;Both right and left upper extremity Additional ADL Goal #1: Patient will teach back 3-4 body mechanics and ECT strategies with light ADL's and mobility with min cues   OT Frequency:  Min 2X/week    Co-evaluation   Reason for Co-Treatment: For patient/therapist safety;To address functional/ADL transfers;Complexity of the patient's impairments (multi-system involvement);Necessary to address cognition/behavior during functional activity PT goals addressed  during session: Mobility/safety with mobility;Balance;Proper use of DME OT goals addressed during session: ADL's and self-care;Proper use of Adaptive equipment and DME  AM-PAC OT 6 Clicks Daily Activity     Outcome Measure Help from another person eating meals?: None Help from another person taking care of personal grooming?: A Little Help from another person toileting, which includes using toliet, bedpan, or urinal?: A Little Help from another person bathing (including washing, rinsing, drying)?: A Lot Help from another person to put on and taking off regular upper body clothing?: A Little Help from another person to put on and taking off regular lower body clothing?: A Lot 6 Click Score: 17   End of Session Equipment Utilized During Treatment: Gait belt;Rolling walker (2 wheels) Nurse Communication: Mobility status  Activity Tolerance: Patient limited by pain;Patient limited by fatigue Patient left: in chair;with call bell/phone within reach;with chair alarm set  OT Visit Diagnosis: Unsteadiness on feet (R26.81);Muscle weakness (generalized) (M62.81);Pain;Cognitive communication deficit (R41.841) Pain - part of body: Leg (back)                Time: 8871-8844 OT Time Calculation (min): 27 min Charges:  OT General Charges $OT Visit: 1 Visit OT Evaluation $OT Eval Low Complexity: 1 Low  Yessenia Maillet OT/L Acute Rehabilitation Department  [phone]  11/12/2023, 3:24 PM

## 2023-11-13 ENCOUNTER — Other Ambulatory Visit (HOSPITAL_COMMUNITY): Payer: Self-pay

## 2023-11-13 ENCOUNTER — Other Ambulatory Visit: Payer: Self-pay

## 2023-11-13 DIAGNOSIS — R600 Localized edema: Principal | ICD-10-CM

## 2023-11-13 DIAGNOSIS — M5410 Radiculopathy, site unspecified: Secondary | ICD-10-CM | POA: Diagnosis present

## 2023-11-13 LAB — CBC
HCT: 37.1 % (ref 36.0–46.0)
Hemoglobin: 11.4 g/dL — ABNORMAL LOW (ref 12.0–15.0)
MCH: 30.5 pg (ref 26.0–34.0)
MCHC: 30.7 g/dL (ref 30.0–36.0)
MCV: 99.2 fL (ref 80.0–100.0)
Platelets: 215 K/uL (ref 150–400)
RBC: 3.74 MIL/uL — ABNORMAL LOW (ref 3.87–5.11)
RDW: 12.7 % (ref 11.5–15.5)
WBC: 4.2 K/uL (ref 4.0–10.5)
nRBC: 0 % (ref 0.0–0.2)

## 2023-11-13 LAB — BASIC METABOLIC PANEL WITH GFR
Anion gap: 11 (ref 5–15)
BUN: 28 mg/dL — ABNORMAL HIGH (ref 8–23)
CO2: 27 mmol/L (ref 22–32)
Calcium: 9.1 mg/dL (ref 8.9–10.3)
Chloride: 103 mmol/L (ref 98–111)
Creatinine, Ser: 2.11 mg/dL — ABNORMAL HIGH (ref 0.44–1.00)
GFR, Estimated: 22 mL/min — ABNORMAL LOW (ref >60)
Glucose, Bld: 65 mg/dL — ABNORMAL LOW (ref 70–99)
Potassium: 4.1 mmol/L (ref 3.5–5.1)
Sodium: 142 mmol/L (ref 135–145)

## 2023-11-13 LAB — GLUCOSE, CAPILLARY
Glucose-Capillary: 68 mg/dL — ABNORMAL LOW (ref 70–99)
Glucose-Capillary: 103 mg/dL — ABNORMAL HIGH (ref 70–99)
Glucose-Capillary: 196 mg/dL — ABNORMAL HIGH (ref 70–99)

## 2023-11-13 MED ORDER — PREDNISONE 20 MG PO TABS
20.0000 mg | ORAL_TABLET | Freq: Every day | ORAL | 0 refills | Status: AC
  Filled 2023-11-13: qty 3, 3d supply, fill #0

## 2023-11-13 MED ORDER — GABAPENTIN 300 MG PO CAPS
300.0000 mg | ORAL_CAPSULE | Freq: Every day | ORAL | 0 refills | Status: DC
  Filled 2023-11-13 – 2023-11-19 (×3): qty 90, 90d supply, fill #0
  Filled 2023-11-27: qty 30, 30d supply, fill #0
  Filled 2023-12-29: qty 30, 30d supply, fill #1
  Filled 2024-01-21 – 2024-01-22 (×2): qty 30, 30d supply, fill #2

## 2023-11-13 MED ORDER — TRESIBA FLEXTOUCH 200 UNIT/ML ~~LOC~~ SOPN
25.0000 [IU] | PEN_INJECTOR | Freq: Every day | SUBCUTANEOUS | 0 refills | Status: DC
  Filled 2023-11-13: qty 12, 90d supply, fill #0

## 2023-11-13 NOTE — Progress Notes (Signed)
 Occupational Therapy Treatment Patient Details Name: [name] MRN: [id number] DOB: 09-10-1940 Today's Date: 11/13/2023   History of present illness 83 yr old female presents to the hospital on 11/12/2023 due to progressive B LE edema and pain starting 9/21.  Pt admitted to hospital in August due to dizziness. Pt PMH includes but is not limited to:  CKD IV, renal CA s/p nephrectomy, HTN, HLD, vertigo, and spine surgery.   OT comments  The pt was seen for progression of functional activity. She required SBA to CGA for supine to sit, for sit to stand using a RW, and for functional ambulation into the hall using a RW. She initially reported having 6/10 RLE pain at rest. Once she was given pain medication, her pain improved to 4/10. She reported pain to be baseline in nature, due to what she believes to be a bulging disc in her spine. She expressed concerns about returning home alone at discharge if her pain is not controlled. OT anticipates she will be okay to return home with home therapy if her pain is controlled, although it is recommended that she have intermittent check ins and availability of assistance if she needs it. If she has no availability of support and her pain is unable to be controlled, then consider short-term SNF rehab. Continue OT plan of care.       If plan is discharge home, recommend the following:  Assistance with cooking/housework;Help with stairs or ramp for entrance;Assist for transportation   Equipment Recommendations  BSC/3in1    Recommendations for Other Services      Precautions / Restrictions Restrictions Weight Bearing Restrictions Per Provider Order: No       Mobility Bed Mobility Overal bed mobility: Needs Assistance Bed Mobility: Supine to Sit     Supine to sit: Supervision, HOB elevated, Used rails          Transfers Overall transfer level: Needs assistance Equipment used: Rolling walker (2 wheels) Transfers: Sit to/from Stand Sit to  Stand: Contact guard assist                 Balance     Sitting balance-Leahy Scale: Good       Standing balance-Leahy Scale:  (CGA with RW)            ADL either performed or assessed with clinical judgement   ADL Overall ADL's : Needs assistance/impaired            Communication Communication Communication: No apparent difficulties   Cognition Arousal: Alert Behavior During Therapy: WFL for tasks assessed/performed      Following commands: Intact        Cueing   Cueing Techniques: Verbal cues             Pertinent Vitals/ Pain       Pain Assessment Pain Assessment: 0-10 Pain Score: 4  Pain Location: RLE Pain Intervention(s): Patient requesting pain meds-RN notified, Monitored during session   Frequency  Min 2X/week        Progress Toward Goals  OT Goals(current goals can now be found in the care plan section)     Acute Rehab OT Goals OT Goal Formulation: With patient Time For Goal Achievement: 11/26/23 Potential to Achieve Goals: Good  Plan      AM-PAC OT 6 Clicks Daily Activity     Outcome Measure   Help from another person eating meals?: None Help from another person taking care of personal grooming?: None Help from another person  toileting, which includes using toliet, bedpan, or urinal?: A Little Help from another person bathing (including washing, rinsing, drying)?: A Little Help from another person to put on and taking off regular upper body clothing?: None Help from another person to put on and taking off regular lower body clothing?: A Little 6 Click Score: 21    End of Session Equipment Utilized During Treatment: Rolling walker (2 wheels)  OT Visit Diagnosis: Pain;Other abnormalities of gait and mobility (R26.89) Pain - Right/Left: Right Pain - part of body: Leg   Activity Tolerance Patient tolerated treatment well   Patient Left in chair;with call bell/phone within reach;with chair alarm set   Nurse  Communication Patient requests pain meds        Time: 1301-1316 OT Time Calculation (min): 15 min  Charges: OT General Charges $OT Visit: 1 Visit OT Treatments $Therapeutic Activity: 8-22 mins     Delanna JINNY Lesches, OTR/L 11/13/2023, 5:17 PM

## 2023-11-13 NOTE — Care Management Obs Status (Signed)
 MEDICARE OBSERVATION STATUS NOTIFICATION   Patient Details  Name: [name] MRN: [id number] Date of Birth: [birth year]   Medicare Observation Status Notification Given:  Yes    Sonda Manuella Quill, RN 11/13/2023, 9:17 AM

## 2023-11-13 NOTE — Hospital Course (Signed)
 83yo with h/o DM, stage 4 CKD, RCC s/p nephrectomy, HTN, and HLD who presented on 9/24 with BLE edema despite daily Lasix .  She was given IV Lasix .

## 2023-11-13 NOTE — Plan of Care (Signed)

## 2023-11-13 NOTE — Inpatient Diabetes Management (Signed)
 Inpatient Diabetes Program Recommendations  AACE/ADA: New Consensus Statement on Inpatient Glycemic Control   Target Ranges:  Prepandial:   less than 140 mg/dL      Peak postprandial:   less than 180 mg/dL (1-2 hours)      Critically ill patients:  140 - 180 mg/dL    Latest Reference Range & Units 10/03/23 04:54  Hemoglobin A1C 4.8 - 5.6 % 7.4 (H)    Latest Reference Range & Units 11/12/23 06:09 11/12/23 06:38 11/12/23 08:36 11/12/23 11:32 11/12/23 16:50 11/12/23 21:27 11/13/23 07:26  Glucose-Capillary 70 - 99 mg/dL 49 (L) 790 (H) 882 (H) 87 139 (H) 183 (H) 68 (L)   Review of Glycemic Control  Diabetes history: DM2 Outpatient Diabetes medications: Tresiba  40 units daily, Novolog  1-2 units BID bedore lunch and dinner, Dexcom CGM   Current orders for Inpatient glycemic control: Lantus  30 units daily, Novolog  0-15 units TID and Novolog  0-5 units at bedtime.   Inpatient Diabetes Program Recommendations: Patient has experienced hypoglycemia at 0609 on 11/12/23 and at 0726 this morning.   Insulin : Please consider decreasing Lantus  to 25 units daily.   Thanks, Lavanda Search, RN, MSN, [facility]  Inpatient Diabetes Coordinator  Pager [phone] (8a-5p)

## 2023-11-13 NOTE — Progress Notes (Signed)
 I attest to student documentation.  Cherye Gaertner, MSN-RN Nursing Faculty/Clinical Instructor [facility]

## 2023-11-13 NOTE — TOC Transition Note (Addendum)
 Transition of Care [facility]) - Discharge Note   Patient Details  Name: [name] MRN: [id number] Date of Birth: 05-25-1940  Transition of Care [facility]) CM/SW Contact:  Sonda Manuella Quill, RN Phone Number: 11/13/2023, 2:53 PM   Clinical Narrative:    D/C orders received; HHPT/OT previously arranged w/ [facility]; Amy Hyatt at [facility] notified; no TOC needs.   Final next level of care: Home w Home Health Services Barriers to Discharge: No Barriers Identified   Patient Goals and CMS Choice Patient states their goals for this hospitalization and ongoing recovery are:: home CMS Medicare.gov Compare Post Acute Care list provided to:: Patient        Discharge Placement                       Discharge Plan and Services Additional resources added to the After Visit Summary for     Discharge Planning Services: CM Consult            DME Arranged: N/A DME Agency: NA       HH Arranged: PT, OT HH Agency: [facility] Date [facility] Agency Contacted: 11/13/23 Time HH Agency Contacted: [phone] Representative spoke with at [facility] Agency: Amy Hyatt  Social Drivers of Health (SDOH) Interventions SDOH Screenings   Food Insecurity: No Food Insecurity (11/13/2023)  Housing: Low Risk  (11/13/2023)  Transportation Needs: No Transportation Needs (11/13/2023)  Utilities: Not At Risk (11/13/2023)  Social Connections: Moderately Integrated (11/12/2023)  Tobacco Use: Low Risk  (06/01/2023)     Readmission Risk Interventions     No data to display

## 2023-11-13 NOTE — Plan of Care (Signed)

## 2023-11-13 NOTE — TOC Initial Note (Addendum)
 Transition of Care [facility]) - Initial/Assessment Note    Patient Details  Name: [name] MRN: [id number] Date of Birth: 03-31-1940  Transition of [facility]) CM/SW Contact:    Sonda Manuella Quill, RN Phone Number: 11/13/2023, 9:26 AM  Clinical Narrative:                 Spoke w/ pt in room; pt said she lives at home; she plans to return at d/c; pt identified POC dtr [name] Sauers [phone]); Bruna Needle (friend) will provide transportation; pt verified insurance/PCP; pt said she uses [employer]; pt has walker, and shower chair; she does not have HH services or home oxygen; recc for HHPT/OT and BSC; pt declined BSC and will contact PCP if needed in the future; pt agreed to HHPT/OT and would like [facility] to provide services; referral given to [facility] at agency; she said agency can provide services; pt notified; agency contact info placed in follow up provider section of d/c instructions.   Expected Discharge Plan: Home w Home Health Services Barriers to Discharge: Continued Medical Work up   Patient Goals and CMS Choice Patient states their goals for this hospitalization and ongoing recovery are:: home CMS Medicare.gov Compare Post Acute Care list provided to:: Patient        Expected Discharge Plan and Services   Discharge Planning Services: CM Consult   Living arrangements for the past 2 months: Single Family Home                 DME Arranged: N/A DME Agency: NA       HH Arranged: PT, OT HH Agency: [facility] Date HH Agency Contacted: 11/13/23 Time HH Agency Contacted: [phone] Representative spoke with at [facility] Agency: Greig Cedar  Prior Living Arrangements/Services Living arrangements for the past 2 months: Single Family Home Lives with:: Self Patient language and need for interpreter reviewed:: Yes Do you feel safe going back to the place where you live?: Yes      Need for Family Participation in Patient Care: Yes (Comment) Care giver  support system in place?: Yes (comment) Current home services: DME (walker) Criminal Activity/Legal Involvement Pertinent to Current Situation/Hospitalization: No - Comment as needed  Activities of Daily Living      Permission Sought/Granted Permission sought to share information with : Case Manager Permission granted to share information with : Yes, Verbal Permission Granted  Share Information with NAME: Case Manager     Permission granted to share info w Relationship: [name] Sauers (dtr) [phone]     Emotional Assessment Appearance:: Appears stated age Attitude/Demeanor/Rapport: Gracious Affect (typically observed): Accepting Orientation: : Oriented to Self, Oriented to Place, Oriented to  Time, Oriented to Situation Alcohol / Substance Use: Not Applicable Psych Involvement: No (comment)  Admission diagnosis:  Hyperkalemia [E87.5] Anasarca [R60.1] Hypoglycemia [E16.2] AKI (acute kidney injury) [N17.9] Patient Active Problem List   Diagnosis Date Noted   Hyperkalemia 11/12/2023   Vertigo 10/02/2023   CKD (chronic kidney disease), stage IV (HCC) 10/02/2023   Pain due to onychomycosis of toenails of both feet 09/01/2018   Left shoulder pain    Limited joint range of motion (ROM)    Chest pain 10/23/2015   Type 2 diabetes mellitus with obesity 10/23/2015   Chest pain with high risk for cardiac etiology 07/28/2014   Insulin  dependent type 2 diabetes mellitus (HCC) 12/27/2010   Hypertension associated with diabetes (HCC) 12/27/2010   Hyperlipidemia associated with type 2 diabetes mellitus (HCC) 12/27/2010  GERD (gastroesophageal reflux disease) 12/27/2010   Renal cell adenocarcinoma (HCC) 12/27/2010   Arthritis 12/27/2010   Cervical stenosis of spine 12/27/2010   Carpal tunnel syndrome 12/27/2010   Asthma 12/27/2010   PCP:  Leigh Lung, MD Pharmacy:   [id number] [address], [place] - [address] [place] [address] Phone: [phone]  Fax: [phone]  DARRYLE LONG - [facility] [address] [place] [address] Phone: [phone] Fax: [phone]     Social Drivers of Health (SDOH) Social History: SDOH Screenings   Food Insecurity: No Food Insecurity (11/13/2023)  Housing: Low Risk  (11/13/2023)  Transportation Needs: No Transportation Needs (11/13/2023)  Utilities: Not At Risk (11/13/2023)  Social Connections: Moderately Integrated (11/12/2023)  Tobacco Use: Low Risk  (06/01/2023)   SDOH Interventions: Food Insecurity Interventions: Intervention Not Indicated, Inpatient TOC Housing Interventions: Intervention Not Indicated, Inpatient TOC Transportation Interventions: Intervention Not Indicated, Inpatient TOC Utilities Interventions: Intervention Not Indicated, Inpatient TOC   Readmission Risk Interventions     No data to display

## 2023-11-13 NOTE — Discharge Summary (Signed)
 Physician Discharge Summary   Patient: [name] MRN: [id number] DOB: 21-Nov-1940  Admit date:     11/12/2023  Discharge date: 11/13/23  Discharge Physician: Delon Herald   PCP: Leigh Lung, MD   Recommendations at discharge:   You are being discharged with home physical and occupational therapy  Resume home Lasix  dosing on Saturday, 9/27 Take prednisone  20 mg daily for 3 days Decrease Tresiba  dose to 25 units daily for now given repeated low blood sugars Follow up with orthopedics, Dr. Reyne, on Tuesday, 9/30; the office should contact you with an appointment time Follow up with Dr. Leigh in 1-2 weeks Do not drive until the nerve pain in your right leg is improved Wear TED hose while awake  Discharge Diagnoses: Principal Problem:   Lower extremity edema Active Problems:   Insulin  dependent type 2 diabetes mellitus (HCC)   Hypertension associated with diabetes (HCC)   Hyperlipidemia associated with type 2 diabetes mellitus (HCC)   Type 2 diabetes mellitus with obesity   CKD (chronic kidney disease), stage IV (HCC)   Hyperkalemia   Radiculopathy of leg    Hospital Course: 83yo with h/o DM, stage 4 CKD, RCC s/p nephrectomy, HTN, and HLD who presented on 9/24 with BLE edema despite daily Lasix .  She was given IV Lasix .  Assessment and Plan:  Lower extremity edema Likely due to age-related mild venous insufficiency Patient does take low-dose diuretic daily Improving with increased diuresis x 1, doubt infection, DVT, heart failure etc Received IV Lasix  in the emergency department Observation overnight TED hose Elevate legs when at rest or sleeping PT/OT consulted, recommend [facility] PT/OT Of note, amlodipine  can cause LE edema and so if this is a recurrent problem it may need to be stopped  RLE Radiculopathy I spoke with orthopedics, Dr. Reyne - mostly back pain Medial branch block injections 2 weeks ago -> pain in outer R hip Saw PA on Tuesday, given Toradol  Maybe  needs MRI of lower back, but can assess in clinic Will give prednisone  20 mg daily x 3 days F/u with orthopedics next Tuesday Continue gabapentin  and Norco as previously prescribed   CKD stage IV Creatinine baseline seems to range from 1.4-2.2 Renal function appears to be at baseline   Grade 1 diastolic dysfunction Continue Coreg  Resume Lasix  on 9/30 Continue ASA   Hyperkalemia Mild, without EKG changes Given a dose of Lokelma  in the emergency department with resolution Metabolic acidosis-due to CKD stage IV, received a dose of IV bicarb with resolution   Insulin -dependent type 2 diabetes Last hemoglobin A1c 7.22 September 2023 Carb modified heart healthy diet Continue Tresiba  at reduced dose of 25 units/day given hypoglycemia during hospitalization   Hyperlipidemia Continue Lipitor   Hypertension Continue amlodipine , carvedilol , hydralazine    Dementia Continue Aricept      Consultants: Orthopedics by telephone only PT OT TOC team  Procedures: None  Antibiotics: None      Pain control - [place]  Controlled Substance Reporting System database was reviewed. and patient was instructed, not to drive, operate heavy machinery, perform activities at heights, swimming or participation in water  activities or provide baby-sitting services while on Pain, Sleep and Anxiety Medications; until their outpatient Physician has advised to do so again. Also recommended to not to take more than prescribed Pain, Sleep and Anxiety Medications.    Disposition: Home Diet recommendation:  Renal diet DISCHARGE MEDICATION: Allergies as of 11/13/2023       Reactions   Sulfa Antibiotics Itching  Medication List     PAUSE taking these medications    furosemide  20 MG tablet Wait to take this until: November 15, 2023 Commonly known as: LASIX  Take 1 tablet (20 mg total) by mouth daily for fluid.       STOP taking these medications    cholecalciferol  1000 units  tablet Commonly known as: VITAMIN D        TAKE these medications    acetaminophen  650 MG CR tablet Commonly known as: Tylenol  8 Hour Take 1 tablet (650 mg total) by mouth every 8 (eight) hours as needed for pain. What changed: how much to take   albuterol  90 MCG/ACT inhaler Commonly known as: PROVENTIL ,VENTOLIN  Inhale 2 puffs into the lungs every 4 (four) hours as needed for wheezing or shortness of breath.   amLODipine  10 MG tablet Commonly known as: NORVASC  Take 1 tablet (10 mg total) by mouth daily.   aspirin  EC 81 MG tablet Take 81 mg by mouth at bedtime.   atorvastatin  10 MG tablet Commonly known as: LIPITOR Take 1 tablet (10 mg total) by mouth every morning.   Baqsimi  Two Pack 3 MG/DOSE Powd Generic drug: Glucagon  Use as needed for severe hypoglycemia Nasally   carvedilol  6.25 MG tablet Commonly known as: COREG  Take 1 tablet (6.25 mg total) by mouth 2 (two) times daily.   CENTRUM SILVER  ADULT 50+ PO Take 1 tablet by mouth daily.   Dexcom G7 Sensor Misc Use to check blood sugar as directed. Change every 10 days. What changed: Another medication with the same name was removed. Continue taking this medication, and follow the directions you see here.   donepezil  10 MG tablet Commonly known as: Aricept  Take 1 tablet (10 mg total) by mouth daily.   gabapentin  300 MG capsule Commonly known as: NEURONTIN  Take 1 capsule (300 mg total) by mouth at bedtime.   hydrALAZINE  50 MG tablet Commonly known as: APRESOLINE  Take 1 tablet (50 mg total) by mouth 2 (two) times daily.   HYDROcodone -acetaminophen  5-325 MG tablet Commonly known as: NORCO/VICODIN Take 0.5 tablets by mouth every 6 (six) to 8 (eight) hours as needed. What changed:  reasons to take this Another medication with the same name was removed. Continue taking this medication, and follow the directions you see here.   NovoLOG  FlexPen 100 UNIT/ML FlexPen Generic drug: insulin  aspart Inject 1-2 Units into  the skin twice daily before lunch and supper. Discard 28 days after first use. What changed: Another medication with the same name was removed. Continue taking this medication, and follow the directions you see here.   predniSONE  20 MG tablet Commonly known as: DELTASONE  Take 1 tablet (20 mg total) by mouth daily with breakfast for 3 days.   Tresiba  FlexTouch 200 UNIT/ML FlexTouch Pen Generic drug: insulin  degludec Inject 26 Units into the skin daily. What changed: how much to take               Durable Medical Equipment  (From admission, onward)           Start     Ordered   11/13/23 0933  For home use only DME Bedside commode  Once       Question:  Patient needs a bedside commode to treat with the following condition  Answer:  Weakness   11/13/23 0934            Follow-up Information     [facility] Follow up.   Contact information: [address] [place] [address] [phone]                Discharge Exam: Filed Weights   11/13/23 0653  Weight: 64.9 kg    Subjective: Patient is uncertain about edema today.  She is mostly concerned about RLE pain which sounds radicular in nature.  She has been seeing orthopedics for this issue.    I spoke with her daughter.  She has a protruding disc. She has received an epidural injection and another shot.  She has been on a brace and propped, taking pain pills.  They are considering surgery.  She has had swelling in her legs maybe since Sunday, ?related to diet. She was recommended to elevate, hydrate - not sure if she did that.  She was out and about Monday but it worsened Tuesday and Wednesday.   Norco is helping but it makes her sleepy.  Back brace is helping.   Objective: Vitals:   11/13/23 1030 11/13/23 1218  BP: (!) 157/55 (!) 145/52  Pulse: 63 63  Resp:  18  Temp:  98.1 F (36.7 C)  SpO2:  97%    Intake/Output Summary (Last 24 hours) at 11/13/2023 1438 Last data filed  at 11/13/2023 1100 Gross per 24 hour  Intake 240 ml  Output 300 ml  Net -60 ml   Filed Weights   11/13/23 0653  Weight: 64.9 kg    Exam:  General:  Appears calm and comfortable and is in NAD Eyes:  normal lids, iris ENT:  grossly normal hearing, lips & tongue, mmm Cardiovascular:  RRR. Trace LE edema.  Respiratory:   CTA bilaterally with no wheezes/rales/rhonchi.  Normal respiratory effort. Abdomen:  soft, NT, ND Skin:  no rash or induration seen on limited exam Musculoskeletal:  grossly normal tone BUE/BLE, no bony abnormality, radicular symptoms without apparent deformity on exam Psychiatric:  grossly normal mood and affect, speech fluent and appropriate, AOx3 Neurologic:  CN 2-12 grossly intact, moves all extremities in coordinated fashion  Data Reviewed: I have reviewed the patient's lab results since admission.  Pertinent labs for today include:   Glucose 65, 68 BUN 28/Creatinine 2.11/GFR 22 WBC 4.2 Hgb 11.4    Condition at discharge: fair  The results of significant diagnostics from this hospitalization (including imaging, microbiology, ancillary and laboratory) are listed below for reference.   Imaging Studies: DG Chest Portable 1 View Result Date: 11/12/2023 EXAM: 1 VIEW(S) XRAY OF THE CHEST 11/12/2023 04:46:00 AM COMPARISON: 10/02/2023 CLINICAL HISTORY: edema. Legs swelling, shortness of breath FINDINGS: LUNGS AND PLEURA: No focal pulmonary opacity. No pulmonary edema. No pleural effusion. No pneumothorax. HEART AND MEDIASTINUM: Atherosclerotic plaque. No acute abnormality of the cardiac and mediastinal silhouettes. BONES AND SOFT TISSUES: Right humeral head rotator cuff anchor suture. Cervical spine surgical hardware. No acute osseous abnormality. IMPRESSION: 1. No acute process. Electronically signed by: Evalene Coho MD 11/12/2023 05:02 AM EDT RP Workstation: [id number]    Microbiology: Results for orders placed or performed during the hospital encounter of  10/02/23  Resp panel by RT-PCR (RSV, Flu A&B, Covid) Anterior Nasal Swab     Status: None   Collection Time: 10/02/23 10:48 AM   Specimen: Anterior Nasal Swab  Result Value Ref Range Status   SARS Coronavirus 2 by RT PCR NEGATIVE NEGATIVE Final    Comment: (NOTE) SARS-CoV-2 target nucleic acids are NOT DETECTED.  The SARS-CoV-2 RNA is generally detectable in upper respiratory specimens during the acute phase of infection. The lowest concentration of  SARS-CoV-2 viral copies this assay can detect is 138 copies/mL. A negative result does not preclude SARS-Cov-2 infection and should not be used as the sole basis for treatment or other patient management decisions. A negative result may occur with  improper specimen collection/handling, submission of specimen other than nasopharyngeal swab, presence of viral mutation(s) within the areas targeted by this assay, and inadequate number of viral copies(<138 copies/mL). A negative result must be combined with clinical observations, patient history, and epidemiological information. The expected result is Negative.  Fact Sheet for Patients:  BloggerCourse.com  Fact Sheet for Healthcare Providers:  SeriousBroker.it  This test is no t yet approved or cleared by the [place]  FDA and  has been authorized for detection and/or diagnosis of SARS-CoV-2 by FDA under an Emergency Use Authorization (EUA). This EUA will remain  in effect (meaning this test can be used) for the duration of the COVID-19 declaration under Section 564(b)(1) of the Act, 21 U.S.C.section 360bbb-3(b)(1), unless the authorization is terminated  or revoked sooner.       Influenza A by PCR NEGATIVE NEGATIVE Final   Influenza B by PCR NEGATIVE NEGATIVE Final    Comment: (NOTE) The Xpert Xpress SARS-CoV-2/FLU/RSV plus assay is intended as an aid in the diagnosis of influenza from Nasopharyngeal swab specimens and should not  be used as a sole basis for treatment. Nasal washings and aspirates are unacceptable for Xpert Xpress SARS-CoV-2/FLU/RSV testing.  Fact Sheet for Patients: BloggerCourse.com  Fact Sheet for Healthcare Providers: SeriousBroker.it  This test is not yet approved or cleared by the [place]  FDA and has been authorized for detection and/or diagnosis of SARS-CoV-2 by FDA under an Emergency Use Authorization (EUA). This EUA will remain in effect (meaning this test can be used) for the duration of the COVID-19 declaration under Section 564(b)(1) of the Act, 21 U.S.C. section 360bbb-3(b)(1), unless the authorization is terminated or revoked.     Resp Syncytial Virus by PCR NEGATIVE NEGATIVE Final    Comment: (NOTE) Fact Sheet for Patients: BloggerCourse.com  Fact Sheet for Healthcare Providers: SeriousBroker.it  This test is not yet approved or cleared by the [place]  FDA and has been authorized for detection and/or diagnosis of SARS-CoV-2 by FDA under an Emergency Use Authorization (EUA). This EUA will remain in effect (meaning this test can be used) for the duration of the COVID-19 declaration under Section 564(b)(1) of the Act, 21 U.S.C. section 360bbb-3(b)(1), unless the authorization is terminated or revoked.  Performed at [facility], [address] [place], [place] [address]     Labs: CBC: Recent Labs  Lab 11/12/23 0032 11/13/23 0554  WBC 5.9 4.2  HGB 12.2 11.4*  HCT 38.1 37.1  MCV 98.7 99.2  PLT 214 215   Basic Metabolic Panel: Recent Labs  Lab 11/12/23 0032 11/12/23 1224 11/13/23 0554  NA 135 140 142  K 5.4* 4.3 4.1  CL 100 99 103  CO2 21* 27 27  GLUCOSE 194* 153* 65*  BUN 25* 23 28*  CREATININE 1.91* 1.83* 2.11*  CALCIUM  9.7 9.6 9.1   Liver Function Tests: No results for input(s): AST, ALT, ALKPHOS, BILITOT,  PROT, ALBUMIN in the last 168 hours. CBG: Recent Labs  Lab 11/12/23 1650 11/12/23 2127 11/13/23 0726 11/13/23 0917 11/13/23 1118  GLUCAP 139* 183* 68* 103* 196*    Discharge time spent: greater than 30 minutes.  Signed: Delon Herald, MD [facility] 11/13/2023

## 2023-11-13 NOTE — Progress Notes (Signed)
Discharge medications delivered to patient at bedside.

## 2023-11-14 ENCOUNTER — Other Ambulatory Visit: Payer: Self-pay

## 2023-11-18 ENCOUNTER — Other Ambulatory Visit (HOSPITAL_COMMUNITY): Payer: Self-pay

## 2023-11-18 DIAGNOSIS — M5416 Radiculopathy, lumbar region: Secondary | ICD-10-CM | POA: Diagnosis not present

## 2023-11-18 MED ORDER — PREGABALIN 75 MG PO CAPS
75.0000 mg | ORAL_CAPSULE | Freq: Two times a day (BID) | ORAL | 0 refills | Status: AC
  Filled 2023-11-18: qty 60, 30d supply, fill #0

## 2023-11-19 ENCOUNTER — Other Ambulatory Visit: Payer: Self-pay

## 2023-11-19 ENCOUNTER — Other Ambulatory Visit: Payer: Self-pay | Admitting: Cardiology

## 2023-11-19 ENCOUNTER — Telehealth: Payer: Self-pay

## 2023-11-19 NOTE — Telephone Encounter (Signed)
   Pre-operative Risk Assessment    Patient Name: [name]  DOB: 04-Mar-1940 MRN: [id number]   Date of last office visit: 10/10/22 WILBERT BIHARI, MD Date of next office visit: NONE   Request for Surgical Clearance    Procedure:  LAMINECTOMY L4/5  Date of Surgery:  Clearance TBD                                Surgeon:  [name], MD Surgeon's Group or Practice Name:  [facility] Phone number:  [phone] Fax number:  [phone]   ATTN: DELON BORDIUK   Type of Clearance Requested:   - Medical  - Pharmacy:  Hold Aspirin      Type of Anesthesia:  Not Indicated   Additional requests/questions:    SignedLucie DELENA Ku   11/19/2023, 10:53 AM

## 2023-11-19 NOTE — Telephone Encounter (Signed)
   Name: BONNITA NEWBY  DOB: [birth year]  MRN: [id number]  Primary Cardiologist: Wilbert Bihari, MD  Chart reviewed as part of pre-operative protocol coverage. Because of Heidie Krall Veazie's past medical history and time since last visit, she will require a follow-up in-office visit in order to better assess preoperative cardiovascular risk.  Pre-op covering staff: - Please schedule appointment and call patient to inform them. If patient already had an upcoming appointment within acceptable timeframe, please add pre-op clearance to the appointment notes so provider is aware. - Please contact requesting surgeon's office via preferred method (i.e, phone, fax) to inform them of need for appointment prior to surgery.  Has not been seen in a year. Not [facility].   Jon Garre Mohamedamin Nifong, PA  11/19/2023, 11:19 AM

## 2023-11-20 ENCOUNTER — Other Ambulatory Visit (HOSPITAL_COMMUNITY): Payer: Self-pay

## 2023-11-20 MED ORDER — CARVEDILOL 6.25 MG PO TABS
6.2500 mg | ORAL_TABLET | Freq: Two times a day (BID) | ORAL | 0 refills | Status: DC
  Filled 2023-11-27: qty 60, 30d supply, fill #0

## 2023-11-21 ENCOUNTER — Other Ambulatory Visit (HOSPITAL_COMMUNITY): Payer: Self-pay

## 2023-11-26 NOTE — Progress Notes (Unsigned)
 Cardiology Office Note    Date:  11/27/2023  ID:  MEHLANI BLANKENBURG, DOB 1940-10-12, MRN [id number] PCP:  Leigh Lung, MD  Cardiologist:  Wilbert Bihari, MD  Electrophysiologist:  None   Chief Complaint: Preoperative cardiac evaluation  History of Present Illness: .   [name] is a 83 y.o. female with visit-pertinent history of hypertension, type II DM, hyperlipidemia, prior normal coronary angiograms in 2012 and 2016, history of nephrectomy, CKD stage III-IV, GERD, mild dementia.  Patient was last seen in clinic on 10/10/2022 by Dr. Bihari.  Patient was noted to be doing well, would occasionally have chest pain occurring after choir reversal or at night.  She denied any chest pain when doing any form of housework.  Patient was referred to pharmacy team for hypertension management.  On chart review patient was admitted in 09/2023 in setting of worsening vertigo, MRI brain without contrast negative for acute intracranial abnormality, age-related cerebral volume loss was seen, numerous scattered foci of increased T2 signal within the subcortical cerebral white matter noted.  Patient worked with physical therapy and was able to climb stairs.  Patient was again admitted from 11/12/2023 through 11/13/2023 for increased leg swelling felt due to age-related mild venous insufficiency, improved with a single increased dose of diuretics.  Patient was also noted to have significant right hip pain and following with Ortho.  Today she presents for follow-up and preoperative cardiac evaluation.  She reports that she has been having significant back and right leg pain related to her ongoing back problems and is eager to proceed with her surgery. She denies chest pain, notes some mild shortness that improved with increased dose of lasix  during her recent hospitalization, continues to note improvement.  She denies any further lower extremity edema since her recent hospitalization.  Patient notes that she does not  lay flat as a result of vertigo, denies any bleeding problems when reclining, denies PND.  She denies any palpitations, presyncope or syncope.  Patient notes that her activity is somewhat limited as a result of her back pain, however she is still able to achieve greater than 4 METS of activity. ROS: .   Today she denies chest pain, fatigue, palpitations, melena, hematuria, hemoptysis, diaphoresis, weakness, presyncope, syncope, orthopnea, and PND.  All other systems are reviewed and otherwise negative. Studies Reviewed: SABRA   EKG:  EKG is ordered today, personally reviewed, demonstrating  EKG Interpretation Date/Time:  Thursday November 27 2023 08:55:04 EDT Ventricular Rate:  56 PR Interval:  186 QRS Duration:  74 QT Interval:  428 QTC Calculation: 413 R Axis:   32  Text Interpretation: Sinus bradycardia with sinus arrhythmia Nonspecific T wave abnormality Confirmed by Miles Leyda [phone]) on 11/27/2023 9:24:37 AM   CV Studies: Cardiac studies reviewed are outlined and summarized above. Otherwise please see EMR for full report. Cardiac Studies & Procedures   ______________________________________________________________________________________________ CARDIAC CATHETERIZATION  CARDIAC CATHETERIZATION 07/28/2014  Conclusion  Normal coronary arteries and slow filling in RCA may indicate endothelial dysfunction. Normal ascending aorta. EF 55-65%  Findings Coronary Findings Diagnostic  Dominance: Right  Left Main The vessel , is normal in caliber is angiographically normal.  Left Anterior Descending The vessel , is normal in caliber is angiographically normal.  Left Circumflex The vessel , is normal in caliber is angiographically normal.  Right Coronary Artery The vessel , is normal in caliber is angiographically normal. Slow filling in RCA improved with IC NTG  Intervention  No interventions have been documented.  STRESS TESTS  NM PET CT CARDIAC PERFUSION MULTI W/ABSOLUTE  BLOODFLOW 07/17/2022  Narrative   The study is normal. The study is low risk.   LV perfusion is normal. There is no evidence of ischemia. There is no evidence of infarction.   Rest left ventricular function is normal. Rest EF: 67 %. Stress left ventricular function is normal. Stress EF: 73 %. End diastolic cavity size is normal. End systolic cavity size is normal. No evidence of transient ischemic dilation (TID) noted.   Myocardial blood flow was computed to be 0.87ml/g/min at rest and 1.69ml/g/min at stress. Global myocardial blood flow reserve was 2.24 and was normal.   Coronary calcium  was present on the attenuation correction CT images. Minimal coronary calcifications were present. Coronary calcifications were present in the left anterior descending artery, left circumflex artery and right coronary artery distribution(s).  CLINICAL DATA:  This over-read does not include interpretation of cardiac or coronary anatomy or pathology. The Cardiac PET CT interpretation by the cardiologist is attached.  COMPARISON:  None Available.  FINDINGS: Vascular: Normal heart size. No pericardial effusion. Normal caliber thoracic aorta with moderate calcified plaque.  Mediastinum/Nodes: Esophagus is unremarkable. No enlarged lymph nodes seen in the chest.  Lungs/Pleura: Central airways are patent. No consolidation, pleural effusion or pneumothorax.  Upper Abdomen: No acute abnormality.  Musculoskeletal: No acute osseous abnormality.  IMPRESSION: 1. No acute extracardiac abnormality. 2. Moderate aortic Atherosclerosis (ICD10-I70.0).   Electronically Signed By: Rea Marc M.D. On: 07/17/2022 10:56   ECHOCARDIOGRAM  ECHOCARDIOGRAM COMPLETE 05/10/2022  Narrative ECHOCARDIOGRAM REPORT    Patient Name:   [name] Date of Exam: 05/10/2022 Medical Rec #:  [id number]      Height:       59.0 in Accession #:    [id number]     Weight:       143.0 lb Date of Birth:  1940-09-28      BSA:           1.599 m Patient Age:    83 years       BP:           147/72 mmHg Patient Gender: F              HR:           91 bpm. Exam Location:  [address]  Procedure: 2D Echo, Cardiac Doppler, Color Doppler, 3D Echo and Strain Analysis  Indications:    Chest Pain R07.9  History:        Patient has prior history of Echocardiogram examinations, most recent 09/23/2015. Risk Factors:Hypertension and Diabetes.  Sonographer:    Augustin Seals RDCS Referring Phys: [phone] TRACI R Jalbert  IMPRESSIONS   1. Mild intracavitary gradient. Peak velocity 1.35 m/s. Peak gradient 7 mmHg. With Valsalva, the peak velocity increases to 3 m/s and peak gradient increases to 36 mmHg. Left ventricular ejection fraction, by estimation, is 60 to 65%. The left ventricle has normal function. The left ventricle has no regional wall motion abnormalities. There is mild left ventricular hypertrophy of the basal-septal segment. Left ventricular diastolic parameters are consistent with Grade I diastolic dysfunction (impaired relaxation). Elevated left ventricular end-diastolic pressure. The average left ventricular global longitudinal strain is -20.9 %. The global longitudinal strain is normal. 2. Right ventricular systolic function is normal. The right ventricular size is normal. 3. Left atrial size was mildly dilated. 4. The mitral valve is normal in structure. No evidence of mitral valve regurgitation. No evidence of mitral  stenosis. 5. The aortic valve is tricuspid. Aortic valve regurgitation is not visualized. No aortic stenosis is present. 6. The inferior vena cava is normal in size with greater than 50% respiratory variability, suggesting right atrial pressure of 3 mmHg.  FINDINGS Left Ventricle: Mild intracavitary gradient. Peak velocity 1.35 m/s. Peak gradient 7 mmHg. With Valsalva, the peak velocity increases to 3 m/s and peak gradient increases to 36 mmHg. Left ventricular ejection fraction, by estimation, is  60 to 65%. The left ventricle has normal function. The left ventricle has no regional wall motion abnormalities. The average left ventricular global longitudinal strain is -20.9 %. The global longitudinal strain is normal. The left ventricular internal cavity size was normal in size. There is mild left ventricular hypertrophy of the basal-septal segment. Left ventricular diastolic parameters are consistent with Grade I diastolic dysfunction (impaired relaxation). Elevated left ventricular end-diastolic pressure.  Right Ventricle: The right ventricular size is normal. No increase in right ventricular wall thickness. Right ventricular systolic function is normal.  Left Atrium: Left atrial size was mildly dilated.  Right Atrium: Right atrial size was normal in size.  Pericardium: There is no evidence of pericardial effusion.  Mitral Valve: The mitral valve is normal in structure. No evidence of mitral valve regurgitation. No evidence of mitral valve stenosis.  Tricuspid Valve: The tricuspid valve is normal in structure. Tricuspid valve regurgitation is not demonstrated. No evidence of tricuspid stenosis.  Aortic Valve: The aortic valve is tricuspid. Aortic valve regurgitation is not visualized. No aortic stenosis is present.  Pulmonic Valve: The pulmonic valve was normal in structure. Pulmonic valve regurgitation is not visualized. No evidence of pulmonic stenosis.  Aorta: The aortic root is normal in size and structure.  Venous: The inferior vena cava is normal in size with greater than 50% respiratory variability, suggesting right atrial pressure of 3 mmHg.  IAS/Shunts: No atrial level shunt detected by color flow Doppler.   LEFT VENTRICLE PLAX 2D LV PW:         1.04 cm   Diastology LV IVS:        1.18 cm   LV e' medial:    5.11 cm/s LVOT diam:     2.00 cm   LV E/e' medial:  19.8 LV SV:         87        LV e' lateral:   7.51 cm/s LV SV Index:   55        LV E/e' lateral: 13.4 LVOT  Area:     3.14 cm 2D Longitudinal Strain 2D Strain GLS (A2C):   -24.2 % 2D Strain GLS (A3C):   -18.4 % 2D Strain GLS (A4C):   -20.1 % 2D Strain GLS Avg:     -20.9 %  3D Volume EF: 3D EF:        59 % LV EDV:       92 ml LV ESV:       38 ml LV SV:        55 ml  RIGHT VENTRICLE RV Basal diam:  2.40 cm RV Mid diam:    2.60 cm RV S prime:     11.50 cm/s TAPSE (M-mode): 2.1 cm  LEFT ATRIUM             Index        RIGHT ATRIUM          Index LA Vol (A2C):   54.9 ml 34.33 ml/m  RA Area:  9.15 cm LA Vol (A4C):   35.1 ml 21.95 ml/m  RA Volume:   14.90 ml 9.32 ml/m LA Biplane Vol: 45.6 ml 28.51 ml/m AORTIC VALVE LVOT Vmax:   109.00 cm/s LVOT Vmean:  75.600 cm/s LVOT VTI:    0.278 m  AORTA Ao Root diam: 2.60 cm Ao Asc diam:  3.00 cm  MITRAL VALVE MV Area (PHT): 3.53 cm     SHUNTS MV Decel Time: 215 msec     Systemic VTI:  0.28 m MV E velocity: 101.00 cm/s  Systemic Diam: 2.00 cm MV A velocity: 104.00 cm/s MV E/A ratio:  0.97  Annabella Scarce MD Electronically signed by Annabella Scarce MD Signature Date/Time: 05/10/2022/6:31:26 PM    Final          ______________________________________________________________________________________________       Current Reported Medications:.    Current Meds  Medication Sig   acetaminophen  (TYLENOL  8 HOUR) 650 MG CR tablet Take 1 tablet (650 mg total) by mouth every 8 (eight) hours as needed for pain.   albuterol  (PROVENTIL ,VENTOLIN ) 90 MCG/ACT inhaler Inhale 2 puffs into the lungs every 4 (four) hours as needed for wheezing or shortness of breath.    amLODipine  (NORVASC ) 10 MG tablet Take 1 tablet (10 mg total) by mouth daily.   aspirin  EC 81 MG tablet Take 81 mg by mouth at bedtime.   atorvastatin  (LIPITOR) 10 MG tablet Take 1 tablet (10 mg total) by mouth every morning.   carvedilol  (COREG ) 6.25 MG tablet Take 1 tablet (6.25 mg total) by mouth 2 (two) times daily.   Continuous Glucose Sensor (DEXCOM G7 SENSOR) MISC  Use to check blood sugar as directed. Change every 10 days.   donepezil  (ARICEPT ) 10 MG tablet Take 1 tablet (10 mg total) by mouth daily.   furosemide  (LASIX ) 20 MG tablet Take 1 tablet (20 mg total) by mouth daily for fluid.   gabapentin  (NEURONTIN ) 300 MG capsule Take 1 capsule (300 mg total) by mouth at bedtime.   Glucagon  (BAQSIMI  TWO PACK) 3 MG/DOSE POWD Use as needed for severe hypoglycemia Nasally   HYDROcodone -acetaminophen  (NORCO/VICODIN) 5-325 MG tablet Take 0.5 tablets by mouth every 6 (six) to 8 (eight) hours as needed. (Patient taking differently: Take 0.5 tablets by mouth every 6 (six) hours as needed for moderate pain (pain score 4-6).)   insulin  aspart (NOVOLOG  FLEXPEN) 100 UNIT/ML FlexPen Inject 1-2 Units into the skin twice daily before lunch and supper. Discard 28 days after first use.   insulin  degludec (TRESIBA  FLEXTOUCH) 200 UNIT/ML FlexTouch Pen Inject 26 Units into the skin daily.   Multiple Vitamins-Minerals (CENTRUM SILVER  ADULT 50+ PO) Take 1 tablet by mouth daily.   pregabalin (LYRICA) 75 MG capsule Take 1 capsule (75 mg total) by mouth 2 (two) times daily.   Physical Exam:    VS:  BP 134/64   Pulse (!) 56   Resp 16   Ht 4' 11 (1.499 m)   Wt 141 lb (64 kg)   SpO2 95%   BMI 28.48 kg/m    Wt Readings from Last 3 Encounters:  11/27/23 141 lb (64 kg)  11/13/23 143 lb 1.3 oz (64.9 kg)  10/02/23 139 lb (63 kg)    GEN: Well nourished, well developed in no acute distress NECK: No JVD; No carotid bruits CARDIAC: RRR, no murmurs, rubs, gallops RESPIRATORY:  Clear to auscultation without rales, wheezing or rhonchi  ABDOMEN: Soft, non-tender, non-distended EXTREMITIES:  No edema; No acute deformity     Asessement  and Plan:.    Preoperative cardiac evaluation: Patient is currently pending laminectomy of L4/5 with Dr. Reyne. Ms. Cimino perioperative risk of a major cardiac event is 6.6% according to the Revised Cardiac Risk Index (RCRI).  Therefore, she is at  high risk for perioperative complications.   Her functional capacity is fair at 5.07 METs according to the [facility] Activity Status Index (DASI).Recommendations:According to ACC/AHA guidelines, no further cardiovascular testing needed.  The patient may proceed to surgery at acceptable risk.  Antiplatelet and/or Anticoagulation Recommendations:Aspirin  can be held for 5-7 days prior to her surgery.  Please resume Aspirin  post operatively when it is felt to be safe from a bleeding standpoint.   HTN: Initial blood pressure today 142/55, on recheck was 134/64.  Patient reports she is no longer monitoring her blood pressure at home, encouraged patient to monitor her blood pressure at home and notify the office if consistently elevated above 140/90.  Continue amlodipine  10 mg daily, carvedilol  6.25 mg twice daily, Lasix  20 mg daily, hydralazine  50 mg twice daily.  Hx of atypical Chest pain: Patient with history of 2 cardiac caths that were normal, most recent being in 2016.  Patient with a ER visit in 2024 in setting of hypertensive urgency with normal troponins.  Stress PET CT in 06/2022 showed no ischemia, normal LV function and no myocardial blood flow reserve.  It has been felt that her episode chest pain are likely related demand ischemia in setting of poorly controlled blood pressure and possible GERD. Today she reports that she has been doing well, denies any chest pain.  She noted some mild shortness of breath that had improved during recent hospitalization with increase Lasix , reports it is now back to her baseline.  Encourage patient to notify the office of any increased shortness of breath or chest pain.  Reviewed ED precautions.  Continue amlodipine  10 mg daily, aspirin  81 mg daily, Lipitor 10 mg daily, carvedilol  6.25 mg twice daily, Lasix  20 mg daily, hydralazine  50 mg twice daily.  Hyperlipidemia: Patient reports she is to have her annual physical with Dr. Leigh in three weeks with fasting lab work completed  at that time, she deferred today.   Disposition: F/u with Dr. Harvest in six months or sooner if needed per patient request.   Signed, Riko Lumsden D Krislyn Donnan, NP

## 2023-11-27 ENCOUNTER — Other Ambulatory Visit (HOSPITAL_COMMUNITY): Payer: Self-pay

## 2023-11-27 ENCOUNTER — Other Ambulatory Visit: Payer: Self-pay

## 2023-11-27 ENCOUNTER — Ambulatory Visit: Attending: Cardiology | Admitting: Cardiology

## 2023-11-27 ENCOUNTER — Encounter: Payer: Self-pay | Admitting: Cardiology

## 2023-11-27 ENCOUNTER — Other Ambulatory Visit: Payer: Self-pay | Admitting: Cardiology

## 2023-11-27 VITALS — BP 134/64 | HR 56 | Resp 16 | Ht 59.0 in | Wt 141.0 lb

## 2023-11-27 DIAGNOSIS — E785 Hyperlipidemia, unspecified: Secondary | ICD-10-CM | POA: Diagnosis not present

## 2023-11-27 DIAGNOSIS — I2510 Atherosclerotic heart disease of native coronary artery without angina pectoris: Secondary | ICD-10-CM | POA: Diagnosis not present

## 2023-11-27 DIAGNOSIS — I10 Essential (primary) hypertension: Secondary | ICD-10-CM | POA: Diagnosis not present

## 2023-11-27 DIAGNOSIS — Z01810 Encounter for preprocedural cardiovascular examination: Secondary | ICD-10-CM

## 2023-11-27 DIAGNOSIS — R079 Chest pain, unspecified: Secondary | ICD-10-CM | POA: Diagnosis not present

## 2023-11-27 MED ORDER — HYDRALAZINE HCL 50 MG PO TABS
50.0000 mg | ORAL_TABLET | Freq: Two times a day (BID) | ORAL | 1 refills | Status: AC
  Filled 2023-11-27: qty 60, 30d supply, fill #0
  Filled 2023-12-29: qty 60, 30d supply, fill #1
  Filled 2024-01-21 – 2024-01-22 (×2): qty 60, 30d supply, fill #2
  Filled 2024-02-24: qty 60, 30d supply, fill #3
  Filled 2024-03-25: qty 60, 30d supply, fill #4

## 2023-11-27 NOTE — Patient Instructions (Signed)
 Follow-Up: At [facility], you and your health needs are our priority.  As part of our continuing mission to provide you with exceptional heart care, our providers are all part of one team.  This team includes your primary Cardiologist (physician) and Advanced Practice Providers or APPs (Physician Assistants and Nurse Practitioners) who all work together to provide you with the care you need, when you need it.  Your next appointment:   6 month(s)  Provider:   Gaylyn Keas, MD

## 2023-11-28 ENCOUNTER — Other Ambulatory Visit: Payer: Self-pay

## 2023-11-28 ENCOUNTER — Other Ambulatory Visit (HOSPITAL_COMMUNITY): Payer: Self-pay

## 2023-12-01 ENCOUNTER — Other Ambulatory Visit: Payer: Self-pay

## 2023-12-05 ENCOUNTER — Encounter (HOSPITAL_COMMUNITY): Payer: Self-pay

## 2023-12-05 ENCOUNTER — Ambulatory Visit: Payer: Self-pay

## 2023-12-05 ENCOUNTER — Other Ambulatory Visit: Payer: Self-pay

## 2023-12-05 DIAGNOSIS — N184 Chronic kidney disease, stage 4 (severe): Secondary | ICD-10-CM | POA: Diagnosis not present

## 2023-12-05 DIAGNOSIS — M545 Low back pain, unspecified: Secondary | ICD-10-CM | POA: Diagnosis not present

## 2023-12-05 DIAGNOSIS — M150 Primary generalized (osteo)arthritis: Secondary | ICD-10-CM | POA: Diagnosis not present

## 2023-12-05 DIAGNOSIS — I129 Hypertensive chronic kidney disease with stage 1 through stage 4 chronic kidney disease, or unspecified chronic kidney disease: Secondary | ICD-10-CM | POA: Diagnosis not present

## 2023-12-05 DIAGNOSIS — E1122 Type 2 diabetes mellitus with diabetic chronic kidney disease: Secondary | ICD-10-CM | POA: Diagnosis not present

## 2023-12-05 DIAGNOSIS — M48062 Spinal stenosis, lumbar region with neurogenic claudication: Secondary | ICD-10-CM

## 2023-12-05 NOTE — Progress Notes (Signed)
 Anesthesia Chart Review: Same day workup  83 year old female follows with cardiology for history of HTN, HLD, venous insufficiency, atypical chest pain (normal cath 2016, stress PET CT in 06/2022 showed no ischemia, normal LV function).  Seen by Katlyn West, NP on 11/27/2023 for preop evaluation.  Per note, Preoperative cardiac evaluation: Patient is currently pending laminectomy of L4/5 with Dr. Reyne. Ms. Perkin perioperative risk of a major cardiac event is 6.6% according to the Revised Cardiac Risk Index (RCRI).  Therefore, she is at high risk for perioperative complications.   Her functional capacity is fair at 5.07 METs according to the [facility] Activity Status Index (DASI).Recommendations:According to ACC/AHA guidelines, no further cardiovascular testing needed.  The patient may proceed to surgery at acceptable risk.  Antiplatelet and/or Anticoagulation Recommendations:Aspirin  can be held for 5-7 days prior to her surgery.  Please resume Aspirin  post operatively when it is felt to be safe from a bleeding standpoint.  Other pertinent history includes OSA not on CPAP, IDDM 2 (A1c 7.4 on 10/03/2023), GERD, asthma, CKD 3/4, renal cancer s/p left nephrectomy, s/p C5-7 ACDF.  BMP 11/13/2023 reviewed, creatinine elevated 2.11 consistent with history of CKD.  CBC 11/13/2023 reviewed, mild anemia with hemoglobin Levan 0.4, otherwise unremarkable.  EKG 11/12/2023: Normal sinus rhythm.  Rate 61. ST elevation, consider inferior injury  Cardiac PET/CT 07/17/2022:   The study is normal. The study is low risk.   LV perfusion is normal. There is no evidence of ischemia. There is no evidence of infarction.   Rest left ventricular function is normal. Rest EF: 67 %. Stress left ventricular function is normal. Stress EF: 73 %. End diastolic cavity size is normal. End systolic cavity size is normal. No evidence of transient ischemic dilation (TID) noted.   Myocardial blood flow was computed to be 0.17ml/g/min at rest  and 1.73ml/g/min at stress. Global myocardial blood flow reserve was 2.24 and was normal.   Coronary calcium  was present on the attenuation correction CT images. Minimal coronary calcifications were present. Coronary calcifications were present in the left anterior descending artery, left circumflex artery and right coronary artery distribution(s).  TTE 05/10/2022: 1. Mild intracavitary gradient. Peak velocity 1.35 m/s. Peak gradient 7  mmHg. With Valsalva, the peak velocity increases to 3 m/s and peak  gradient increases to 36 mmHg. Left ventricular ejection fraction, by  estimation, is 60 to 65%. The left ventricle   has normal function. The left ventricle has no regional wall motion  abnormalities. There is mild left ventricular hypertrophy of the  basal-septal segment. Left ventricular diastolic parameters are consistent  with Grade I diastolic dysfunction (impaired  relaxation). Elevated left ventricular end-diastolic pressure. The average  left ventricular global longitudinal strain is -20.9 %. The global  longitudinal strain is normal.   2. Right ventricular systolic function is normal. The right ventricular  size is normal.   3. Left atrial size was mildly dilated.   4. The mitral valve is normal in structure. No evidence of mitral valve  regurgitation. No evidence of mitral stenosis.   5. The aortic valve is tricuspid. Aortic valve regurgitation is not  visualized. No aortic stenosis is present.   6. The inferior vena cava is normal in size with greater than 50%  respiratory variability, suggesting right atrial pressure of 3 mmHg.       Lynwood Geofm RIGGERS [facility] Stay [facility]/Anesthesiology Phone [phone] 12/05/2023 1:17 PM

## 2023-12-05 NOTE — Progress Notes (Addendum)
 PCP - Leigh Lung, MD (LOV per patient seeing PCP at noon today 12-05-23)  Cardiologist - Shlomo Wilbert SAUNDERS, MD   PPM/ICD - denies Device Orders - n/a Rep Notified - n/a  Chest x-ray -  EKG - 11-27-23 Stress Test - 06-2022 ECHO - 05-10-22 Cardiac Cath - 07-28-14  Sleep test- 07-29-17 CPAP - unable to tolerate  GLP-1 -denies  Fasting Blood Sugar -  per patient blood sugar ranges 126 Checks Blood Sugar [place]ax[place]om left arm  Blood Thinner Instructions: denies Aspirin  Instructions: last dose 11-25-23 per patient  ERAS Protcol - clear liquids until 4:30  COVID TEST- n/a  Anesthesia review: Yes, Hx of HTN, DM, CKD, Kidney Ca  Patient verbally denies any shortness of breath, fever, cough and chest pain during phone call   -------------  SDW INSTRUCTIONS given:  Your procedure is scheduled on December 08, 2023.  Report to [facility] Main Entrance A at 5:30 A.M., and check in at the Admitting office.  Call this number if you have problems the morning of surgery:  [phone]   Remember:  Do not eat after midnight the night before your surgery  You may drink clear liquids until 4:30 the morning of your surgery.   Clear liquids allowed are: Water , Non-Citrus Juices (without pulp), Carbonated Beverages, Clear Tea, Black Coffee Only, and Gatorade    Take these medicines the morning of surgery with A SIP OF WATER   acetaminophen  (TYLENOL  8 HOUR)  albuterol  (PROVENTIL ,VENTOLIN )  inhaler  amLODipine  (NORVASC )  atorvastatin  (LIPITOR)  donepezil  (ARICEPT )  Glucagon  (BAQSIMI  TWO PACK)  hydrALAZINE  (APRESOLINE )  HYDROcodone -acetaminophen  (NORCO/VICODIN)  pregabalin (LYRICA   WHAT DO I DO ABOUT MY DIABETES MEDICATION?   Do not take oral diabetes medicines (pills) the morning of surgery.   NOVOLOG  FLEXPEN Patient to take only if blood sugar greater than 220 dose surgery      THE MORNING OF SURGERY, take 18 units of TRESIBA  insulin . (Half of 36)  The day of surgery, do not take  other diabetes injectables, including Byetta (exenatide), Bydureon (exenatide ER), Victoza (liraglutide), or Trulicity (dulaglutide).  If your CBG is greater than 220 mg/dL, you may take  of your sliding scale (correction) dose of insulin .   HOW TO MANAGE YOUR DIABETES BEFORE AND AFTER SURGERY  Why is it important to control my blood sugar before and after surgery? Improving blood sugar levels before and after surgery helps healing and can limit problems. A way of improving blood sugar control is eating a healthy diet by:  Eating less sugar and carbohydrates  Increasing activity/exercise  Talking with your doctor about reaching your blood sugar goals High blood sugars (greater than 180 mg/dL) can raise your risk of infections and slow your recovery, so you will need to focus on controlling your diabetes during the weeks before surgery. Make sure that the doctor who takes care of your diabetes knows about your planned surgery including the date and location.  How do I manage my blood sugar before surgery? Check your blood sugar at least 4 times a day, starting 2 days before surgery, to make sure that the level is not too high or low.  Check your blood sugar the morning of your surgery when you wake up and every 2 hours until you get to the Short Stay unit.  If your blood sugar is less than 70 mg/dL, you will need to treat for low blood sugar: Do not take insulin . Treat a low blood sugar (less than 70 mg/dL) with  cup of clear juice (cranberry or apple), 4 glucose tablets, OR glucose gel. Recheck blood sugar in 15 minutes after treatment (to make sure it is greater than 70 mg/dL). If your blood sugar is not greater than 70 mg/dL on recheck, call [phone] for further instructions. Report your blood sugar to the short stay nurse when you get to Short Stay.  If you are admitted to the hospital after surgery: Your blood sugar will be checked by the staff and you will probably be given  insulin  after surgery (instead of oral diabetes medicines) to make sure you have good blood sugar levels. The goal for blood sugar control after surgery is 80-180 mg/dL.  As of today, STOP taking any Aspirin  (unless otherwise instructed by your surgeon) Aleve, Naproxen, Ibuprofen, Motrin, Advil, Goody's, BC's, all herbal medications, fish oil, and all vitamins.                      Do not wear jewelry, make up, or nail polish            Do not wear lotions, powders, perfumes/colognes, or deodorant.            Do not shave 48 hours prior to surgery.  Men may shave face and neck.            Do not bring valuables to the hospital.            [facility] is not responsible for any belongings or valuables.  Do NOT Smoke (Tobacco/Vaping) 24 hours prior to your procedure If you use a CPAP at night, you may bring all equipment for your overnight stay.   Contacts, glasses, dentures or bridgework may not be worn into surgery.      For patients admitted to the hospital, discharge time will be determined by your treatment team.   Patients discharged the day of surgery will not be allowed to drive home, and someone needs to stay with them for 24 hours.    Special instructions:   [place]- Preparing For Surgery  Before surgery, you can play an important role. Because skin is not sterile, your skin needs to be as free of germs as possible. You can reduce the number of germs on your skin by washing with CHG (chlorahexidine gluconate) Soap before surgery.  CHG is an antiseptic cleaner which kills germs and bonds with the skin to continue killing germs even after washing.    Oral Hygiene is also important to reduce your risk of infection.  Remember - BRUSH YOUR TEETH THE MORNING OF SURGERY WITH YOUR REGULAR TOOTHPASTE  Please do not use if you have an allergy to CHG or antibacterial soaps. If your skin becomes reddened/irritated stop using the CHG.  Do not shave (including legs and underarms) for at  least 48 hours prior to first CHG shower. It is OK to shave your face.  Please follow these instructions carefully.   Shower the NIGHT BEFORE SURGERY and the MORNING OF SURGERY with DIAL Soap.   Pat yourself dry with a CLEAN TOWEL.  Wear CLEAN PAJAMAS to bed the night before surgery  Place CLEAN SHEETS on your bed the night of your first shower and DO NOT SLEEP WITH PETS.   Day of Surgery: Please shower morning of surgery  Wear Clean/Comfortable clothing the morning of surgery Do not apply any deodorants/lotions.   Remember to brush your teeth WITH YOUR REGULAR TOOTHPASTE.   Questions were answered. Patient verbalized understanding  of instructions.

## 2023-12-05 NOTE — Anesthesia Preprocedure Evaluation (Signed)
 Anesthesia Evaluation  Patient identified by MRN, date of birth, ID band Patient awake    Reviewed: Allergy & Precautions, NPO status , Patient's Chart, lab work & pertinent test results  History of Anesthesia Complications Negative for: history of anesthetic complications  Airway Mallampati: III  TM Distance: >3 FB Neck ROM: Full   Comment: Previous grade II view with MAC 4, easy mask Dental  (+) Dental Advisory Given   Pulmonary neg shortness of breath, asthma (no recent inhaler use) , sleep apnea (does not use CPAP) , neg COPD, neg recent URI   Pulmonary exam normal breath sounds clear to auscultation       Cardiovascular hypertension (amlodipine , carvedilol , furosemide , hydralazine ), Pt. on medications and Pt. on home beta blockers (-) angina + Past MI (2012, patient denies)  (-) CAD, (-) Cardiac Stents and (-) CABG + dysrhythmias  Rhythm:Regular Rate:Normal  HLD  Low-risk stress test 07/17/2022  TTE 05/10/2022: IMPRESSIONS    1. Mild intracavitary gradient. Peak velocity 1.35 m/s. Peak gradient 7  mmHg. With Valsalva, the peak velocity increases to 3 m/s and peak  gradient increases to 36 mmHg. Left ventricular ejection fraction, by  estimation, is 60 to 65%. The left ventricle   has normal function. The left ventricle has no regional wall motion  abnormalities. There is mild left ventricular hypertrophy of the  basal-septal segment. Left ventricular diastolic parameters are consistent  with Grade I diastolic dysfunction (impaired  relaxation). Elevated left ventricular end-diastolic pressure. The average  left ventricular global longitudinal strain is -20.9 %. The global  longitudinal strain is normal.   2. Right ventricular systolic function is normal. The right ventricular  size is normal.   3. Left atrial size was mildly dilated.   4. The mitral valve is normal in structure. No evidence of mitral valve   regurgitation. No evidence of mitral stenosis.   5. The aortic valve is tricuspid. Aortic valve regurgitation is not  visualized. No aortic stenosis is present.   6. The inferior vena cava is normal in size with greater than 50%  respiratory variability, suggesting right atrial pressure of 3 mmHg.     Neuro/Psych neg Seizures Vertigo   Neuromuscular disease (cervical stenosis s/p C5-7 ACDF)    GI/Hepatic Neg liver ROS,GERD  ,,  Endo/Other  diabetes, Type 2, Insulin  Dependent    Renal/GU CRFRenal disease (left renal cancer s/p nephrectomy)     Musculoskeletal  (+) Arthritis ,    Abdominal   Peds  Hematology  (+) Blood dyscrasia, anemia Lab Results      Component                Value               Date                      WBC                      4.2                 11/13/2023                HGB                      11.4 (L)            11/13/2023  HCT                      37.1                11/13/2023                MCV                      99.2                11/13/2023                PLT                      215                 11/13/2023              Anesthesia Other Findings   Reproductive/Obstetrics                              Anesthesia Physical Anesthesia Plan  ASA: 3  Anesthesia Plan: General   Post-op Pain Management: Tylenol  PO (pre-op)*   Induction: Intravenous  PONV Risk Score and Plan: 3 and Ondansetron , Dexamethasone  and Treatment may vary due to age or medical condition  Airway Management Planned: Oral ETT  Additional Equipment:   Intra-op Plan:   Post-operative Plan: Extubation in OR  Informed Consent: I have reviewed the patients History and Physical, chart, labs and discussed the procedure including the risks, benefits and alternatives for the proposed anesthesia with the patient or authorized representative who has indicated his/her understanding and acceptance.     Dental advisory  given  Plan Discussed with: CRNA and Anesthesiologist  Anesthesia Plan Comments: (Risks of general anesthesia discussed including, but not limited to, sore throat, hoarse voice, chipped/damaged teeth, injury to vocal cords, nausea and vomiting, allergic reactions, lung infection, heart attack, stroke, and death. All questions answered.   PAT note by Lynwood Hope, PA-C: 83 year old female follows with cardiology for history of HTN, HLD, venous insufficiency, atypical chest pain (normal cath 2016, stress PET CT in 06/2022 showed no ischemia, normal LV function).  Seen by Katlyn West, NP on 11/27/2023 for preop evaluation.  Per note, Preoperative cardiac evaluation: Patient is currently pending laminectomy of L4/5 with Dr. Reyne. Ms. Mittelstadt perioperative risk of a major cardiac event is 6.6% according to the Revised Cardiac Risk Index (RCRI).  Therefore, she is at high risk for perioperative complications.   Her functional capacity is fair at 5.07 METs according to the Duke Activity Status Index (DASI).Recommendations:According to ACC/AHA guidelines, no further cardiovascular testing needed.  The patient may proceed to surgery at acceptable risk.  Antiplatelet and/or Anticoagulation Recommendations:Aspirin  can be held for 5-7 days prior to her surgery.  Please resume Aspirin  post operatively when it is felt to be safe from a bleeding standpoint.  Other pertinent history includes OSA not on CPAP, IDDM 2 (A1c 7.4 on 10/03/2023), GERD, asthma, CKD 3/4, renal cancer s/p left nephrectomy, s/p C5-7 ACDF.  BMP 11/13/2023 reviewed, creatinine elevated 2.11 consistent with history of CKD.  CBC 11/13/2023 reviewed, mild anemia with hemoglobin Levan 0.4, otherwise unremarkable.  EKG 11/12/2023: Normal sinus rhythm.  Rate 61. ST elevation, consider inferior injury  Cardiac PET/CT 07/17/2022:   The study is normal. The study is low risk.   LV perfusion is normal.  There is no evidence of ischemia. There is no  evidence of infarction.   Rest left ventricular function is normal. Rest EF: 67 %. Stress left ventricular function is normal. Stress EF: 73 %. End diastolic cavity size is normal. End systolic cavity size is normal. No evidence of transient ischemic dilation (TID) noted.   Myocardial blood flow was computed to be 0.54ml/g/min at rest and 1.13ml/g/min at stress. Global myocardial blood flow reserve was 2.24 and was normal.   Coronary calcium  was present on the attenuation correction CT images. Minimal coronary calcifications were present. Coronary calcifications were present in the left anterior descending artery, left circumflex artery and right coronary artery distribution(s).  TTE 05/10/2022: 1. Mild intracavitary gradient. Peak velocity 1.35 m/s. Peak gradient 7  mmHg. With Valsalva, the peak velocity increases to 3 m/s and peak  gradient increases to 36 mmHg. Left ventricular ejection fraction, by  estimation, is 60 to 65%. The left ventricle  has normal function. The left ventricle has no regional wall motion  abnormalities. There is mild left ventricular hypertrophy of the  basal-septal segment. Left ventricular diastolic parameters are consistent  with Grade I diastolic dysfunction (impaired  relaxation). Elevated left ventricular end-diastolic pressure. The average  left ventricular global longitudinal strain is -20.9 %. The global  longitudinal strain is normal.  2. Right ventricular systolic function is normal. The right ventricular  size is normal.  3. Left atrial size was mildly dilated.  4. The mitral valve is normal in structure. No evidence of mitral valve  regurgitation. No evidence of mitral stenosis.  5. The aortic valve is tricuspid. Aortic valve regurgitation is not  visualized. No aortic stenosis is present.  6. The inferior vena cava is normal in size with greater than 50%  respiratory variability, suggesting right atrial pressure of 3 mmHg.   )          Anesthesia Quick Evaluation

## 2023-12-08 ENCOUNTER — Observation Stay (HOSPITAL_COMMUNITY): Admission: RE | Admit: 2023-12-08 | Discharge: 2023-12-09 | Disposition: A | Discharge status: Home Health

## 2023-12-08 ENCOUNTER — Other Ambulatory Visit: Payer: Self-pay

## 2023-12-08 ENCOUNTER — Other Ambulatory Visit (HOSPITAL_COMMUNITY): Payer: Self-pay

## 2023-12-08 ENCOUNTER — Ambulatory Visit (HOSPITAL_COMMUNITY): Payer: Self-pay | Admitting: Physician Assistant

## 2023-12-08 ENCOUNTER — Ambulatory Visit (HOSPITAL_COMMUNITY)

## 2023-12-08 ENCOUNTER — Encounter (HOSPITAL_COMMUNITY): Admission: RE | Disposition: A | Payer: Self-pay | Source: Home / Self Care

## 2023-12-08 DIAGNOSIS — Z7982 Long term (current) use of aspirin: Secondary | ICD-10-CM | POA: Diagnosis not present

## 2023-12-08 DIAGNOSIS — N189 Chronic kidney disease, unspecified: Secondary | ICD-10-CM | POA: Insufficient documentation

## 2023-12-08 DIAGNOSIS — N184 Chronic kidney disease, stage 4 (severe): Secondary | ICD-10-CM | POA: Diagnosis not present

## 2023-12-08 DIAGNOSIS — E1022 Type 1 diabetes mellitus with diabetic chronic kidney disease: Secondary | ICD-10-CM | POA: Diagnosis not present

## 2023-12-08 DIAGNOSIS — M545 Low back pain, unspecified: Secondary | ICD-10-CM | POA: Diagnosis present

## 2023-12-08 DIAGNOSIS — J45909 Unspecified asthma, uncomplicated: Secondary | ICD-10-CM | POA: Insufficient documentation

## 2023-12-08 DIAGNOSIS — M4316 Spondylolisthesis, lumbar region: Secondary | ICD-10-CM

## 2023-12-08 DIAGNOSIS — Z79899 Other long term (current) drug therapy: Secondary | ICD-10-CM | POA: Insufficient documentation

## 2023-12-08 DIAGNOSIS — Z794 Long term (current) use of insulin: Secondary | ICD-10-CM | POA: Insufficient documentation

## 2023-12-08 DIAGNOSIS — I129 Hypertensive chronic kidney disease with stage 1 through stage 4 chronic kidney disease, or unspecified chronic kidney disease: Secondary | ICD-10-CM | POA: Diagnosis not present

## 2023-12-08 DIAGNOSIS — Z4789 Encounter for other orthopedic aftercare: Secondary | ICD-10-CM | POA: Diagnosis not present

## 2023-12-08 DIAGNOSIS — M48062 Spinal stenosis, lumbar region with neurogenic claudication: Secondary | ICD-10-CM | POA: Diagnosis not present

## 2023-12-08 DIAGNOSIS — E1122 Type 2 diabetes mellitus with diabetic chronic kidney disease: Secondary | ICD-10-CM | POA: Diagnosis not present

## 2023-12-08 HISTORY — PX: DECOMPRESSIVE LUMBAR LAMINECTOMY LEVEL 1: SHX5791

## 2023-12-08 LAB — TYPE AND SCREEN
ABO/RH(D): O POS
Antibody Screen: NEGATIVE

## 2023-12-08 LAB — GLUCOSE, CAPILLARY
Glucose-Capillary: 115 mg/dL — ABNORMAL HIGH (ref 70–99)
Glucose-Capillary: 151 mg/dL — ABNORMAL HIGH (ref 70–99)
Glucose-Capillary: 269 mg/dL — ABNORMAL HIGH (ref 70–99)
Glucose-Capillary: 366 mg/dL — ABNORMAL HIGH (ref 70–99)
Glucose-Capillary: 272 mg/dL — ABNORMAL HIGH (ref 70–99)

## 2023-12-08 LAB — SURGICAL PCR SCREEN
MRSA, PCR: NEGATIVE
Staphylococcus aureus: NEGATIVE

## 2023-12-08 SURGERY — DECOMPRESSIVE LUMBAR LAMINECTOMY LEVEL 1
Anesthesia: General

## 2023-12-08 MED ORDER — FENTANYL CITRATE (PF) 100 MCG/2ML IJ SOLN
INTRAMUSCULAR | Status: AC
  Filled 2023-12-08: qty 2

## 2023-12-08 MED ORDER — PROPOFOL 10 MG/ML IV BOLUS
INTRAVENOUS | Status: AC
Start: 2023-12-08 — End: 2023-12-08
  Filled 2023-12-08: qty 20

## 2023-12-08 MED ORDER — TRANEXAMIC ACID-NACL 1000-0.7 MG/100ML-% IV SOLN
INTRAVENOUS | Status: AC
  Filled 2023-12-08: qty 100

## 2023-12-08 MED ORDER — SODIUM CHLORIDE 0.9 % IV SOLN: 250.0000 mL | INTRAVENOUS | Status: AC

## 2023-12-08 MED ORDER — INSULIN GLARGINE-YFGN 100 UNIT/ML ~~LOC~~ SOLN
20.0000 [IU] | Freq: Every day | SUBCUTANEOUS | Status: DC
  Administered 2023-12-08: 20 [IU] via SUBCUTANEOUS
  Filled 2023-12-08 (×2): qty 0.2

## 2023-12-08 MED ORDER — SENNOSIDES-DOCUSATE SODIUM 8.6-50 MG PO TABS: 1.0000 | ORAL_TABLET | Freq: Every evening | ORAL | Status: DC | PRN

## 2023-12-08 MED ORDER — SUGAMMADEX SODIUM 200 MG/2ML IV SOLN
INTRAVENOUS | Status: DC | PRN
  Administered 2023-12-08: 200 mg via INTRAVENOUS

## 2023-12-08 MED ORDER — SODIUM CHLORIDE 0.9 % IV SOLN: INTRAVENOUS | Status: AC

## 2023-12-08 MED ORDER — HYDRALAZINE HCL 20 MG/ML IJ SOLN
INTRAMUSCULAR | Status: AC
Start: 2023-12-08 — End: 2023-12-08
  Filled 2023-12-08: qty 1

## 2023-12-08 MED ORDER — BUPIVACAINE-EPINEPHRINE 0.25% -1:200000 IJ SOLN
INTRAMUSCULAR | Status: DC | PRN
  Administered 2023-12-08: 30 mL

## 2023-12-08 MED ORDER — SODIUM CHLORIDE 0.9% FLUSH
3.0000 mL | Freq: Two times a day (BID) | INTRAVENOUS | Status: DC
  Administered 2023-12-08 (×2): 3 mL via INTRAVENOUS

## 2023-12-08 MED ORDER — INSULIN ASPART 100 UNIT/ML FLEXPEN: 1.0000 [IU] | PEN_INJECTOR | Freq: Two times a day (BID) | SUBCUTANEOUS | Status: DC

## 2023-12-08 MED ORDER — DONEPEZIL HCL 10 MG PO TABS
10.0000 mg | ORAL_TABLET | Freq: Every day | ORAL | Status: DC
  Administered 2023-12-08: 10 mg via ORAL
  Filled 2023-12-08 (×2): qty 1

## 2023-12-08 MED ORDER — LACTATED RINGERS IV SOLN: INTRAVENOUS | Status: DC

## 2023-12-08 MED ORDER — ADULT MULTIVITAMIN W/MINERALS CH
1.0000 | ORAL_TABLET | Freq: Every day | ORAL | Status: DC
  Administered 2023-12-08 – 2023-12-09 (×2): 1 via ORAL
  Filled 2023-12-08 (×2): qty 1

## 2023-12-08 MED ORDER — BUPIVACAINE HCL 0.25 % IJ SOLN
INTRAMUSCULAR | Status: DC | PRN
  Administered 2023-12-08: 30 mL

## 2023-12-08 MED ORDER — SODIUM CHLORIDE 0.9% FLUSH: 3.0000 mL | INTRAVENOUS | Status: DC | PRN

## 2023-12-08 MED ORDER — PHENOL 1.4 % MT LIQD: 1.0000 | OROMUCOSAL | Status: DC | PRN

## 2023-12-08 MED ORDER — HYDRALAZINE HCL 20 MG/ML IJ SOLN
2.5000 mg | Freq: Once | INTRAMUSCULAR | Status: AC
  Administered 2023-12-08: 2.5 mg via INTRAVENOUS

## 2023-12-08 MED ORDER — AMLODIPINE BESYLATE 10 MG PO TABS
10.0000 mg | ORAL_TABLET | Freq: Every day | ORAL | Status: DC
  Administered 2023-12-08 – 2023-12-09 (×2): 10 mg via ORAL
  Filled 2023-12-08 (×2): qty 1

## 2023-12-08 MED ORDER — ONDANSETRON HCL 4 MG PO TABS: 4.0000 mg | ORAL_TABLET | Freq: Four times a day (QID) | ORAL | Status: DC | PRN

## 2023-12-08 MED ORDER — DIPHENHYDRAMINE HCL 25 MG PO CAPS
25.0000 mg | ORAL_CAPSULE | Freq: Four times a day (QID) | ORAL | Status: DC | PRN
  Administered 2023-12-08: 25 mg via ORAL
  Filled 2023-12-08: qty 1

## 2023-12-08 MED ORDER — BISACODYL 10 MG RE SUPP: 10.0000 mg | Freq: Every day | RECTAL | Status: DC | PRN

## 2023-12-08 MED ORDER — DEXAMETHASONE SOD PHOSPHATE PF 10 MG/ML IJ SOLN
INTRAMUSCULAR | Status: DC | PRN
  Administered 2023-12-08: 5 mg via INTRAVENOUS

## 2023-12-08 MED ORDER — PHENYLEPHRINE HCL-NACL 20-0.9 MG/250ML-% IV SOLN
INTRAVENOUS | Status: DC | PRN
  Administered 2023-12-08: 25 ug/min via INTRAVENOUS

## 2023-12-08 MED ORDER — PROPOFOL 10 MG/ML IV BOLUS
INTRAVENOUS | Status: DC | PRN
  Administered 2023-12-08: 70 mg via INTRAVENOUS

## 2023-12-08 MED ORDER — ONDANSETRON HCL 4 MG/2ML IJ SOLN
INTRAMUSCULAR | Status: DC | PRN
  Administered 2023-12-08: 4 mg via INTRAVENOUS

## 2023-12-08 MED ORDER — ALBUTEROL 90 MCG/ACT IN AERS: 2.0000 | INHALATION_SPRAY | RESPIRATORY_TRACT | Status: DC | PRN

## 2023-12-08 MED ORDER — ASPIRIN 81 MG PO TBEC
81.0000 mg | DELAYED_RELEASE_TABLET | Freq: Every day | ORAL | Status: DC
Start: 2023-12-08 — End: 2023-12-09
  Administered 2023-12-08: 81 mg via ORAL
  Filled 2023-12-08: qty 1

## 2023-12-08 MED ORDER — HYDROMORPHONE HCL 1 MG/ML IJ SOLN
0.2500 mg | INTRAMUSCULAR | Status: DC | PRN
  Administered 2023-12-08 (×2): 0.5 mg via INTRAVENOUS

## 2023-12-08 MED ORDER — LIDOCAINE 2% (20 MG/ML) 5 ML SYRINGE
INTRAMUSCULAR | Status: AC
Start: 2023-12-08 — End: 2023-12-08
  Filled 2023-12-08: qty 5

## 2023-12-08 MED ORDER — 0.9 % SODIUM CHLORIDE (POUR BTL) OPTIME
TOPICAL | Status: DC | PRN
  Administered 2023-12-08: 1000 mL

## 2023-12-08 MED ORDER — INSULIN ASPART 100 UNIT/ML IJ SOLN
0.0000 [IU] | Freq: Every day | INTRAMUSCULAR | Status: DC
  Administered 2023-12-08: 5 [IU] via SUBCUTANEOUS

## 2023-12-08 MED ORDER — INSULIN ASPART 100 UNIT/ML IJ SOLN
0.0000 [IU] | Freq: Three times a day (TID) | INTRAMUSCULAR | Status: DC
  Administered 2023-12-09: 5 [IU] via SUBCUTANEOUS
  Administered 2023-12-09: 8 [IU] via SUBCUTANEOUS

## 2023-12-08 MED ORDER — FLEET ENEMA RE ENEM
1.0000 | ENEMA | Freq: Once | RECTAL | Status: DC | PRN
Start: 2023-12-08 — End: 2023-12-09

## 2023-12-08 MED ORDER — OXYCODONE HCL 5 MG/5ML PO SOLN: 5.0000 mg | Freq: Once | ORAL | Status: AC | PRN

## 2023-12-08 MED ORDER — INSULIN ASPART 100 UNIT/ML IJ SOLN: 0.0000 [IU] | INTRAMUSCULAR | Status: DC | PRN

## 2023-12-08 MED ORDER — INSULIN ASPART 100 UNIT/ML IJ SOLN: 0.0000 [IU] | Freq: Three times a day (TID) | INTRAMUSCULAR | Status: DC

## 2023-12-08 MED ORDER — INSULIN DEGLUDEC 200 UNIT/ML ~~LOC~~ SOPN: 25.0000 [IU] | PEN_INJECTOR | Freq: Every day | SUBCUTANEOUS | Status: DC

## 2023-12-08 MED ORDER — BUPIVACAINE-EPINEPHRINE (PF) 0.25% -1:200000 IJ SOLN
INTRAMUSCULAR | Status: AC
  Filled 2023-12-08: qty 30

## 2023-12-08 MED ORDER — AMISULPRIDE (ANTIEMETIC) 5 MG/2ML IV SOLN: 10.0000 mg | Freq: Once | INTRAVENOUS | Status: DC | PRN

## 2023-12-08 MED ORDER — FUROSEMIDE 20 MG PO TABS
20.0000 mg | ORAL_TABLET | Freq: Every day | ORAL | Status: DC
  Administered 2023-12-09: 20 mg via ORAL
  Filled 2023-12-08 (×2): qty 1

## 2023-12-08 MED ORDER — VANCOMYCIN HCL 1000 MG IV SOLR
INTRAVENOUS | Status: AC
  Filled 2023-12-08: qty 20

## 2023-12-08 MED ORDER — ONDANSETRON HCL 4 MG/2ML IJ SOLN
INTRAMUSCULAR | Status: AC
  Filled 2023-12-08: qty 2

## 2023-12-08 MED ORDER — INSULIN ASPART 100 UNIT/ML IJ SOLN
0.0000 [IU] | Freq: Three times a day (TID) | INTRAMUSCULAR | Status: DC
  Administered 2023-12-08: 8 [IU] via SUBCUTANEOUS

## 2023-12-08 MED ORDER — ATORVASTATIN CALCIUM 10 MG PO TABS
10.0000 mg | ORAL_TABLET | Freq: Every morning | ORAL | Status: DC
  Administered 2023-12-09: 10 mg via ORAL
  Filled 2023-12-08: qty 1

## 2023-12-08 MED ORDER — ONDANSETRON HCL 4 MG/2ML IJ SOLN: 4.0000 mg | Freq: Four times a day (QID) | INTRAMUSCULAR | Status: DC | PRN

## 2023-12-08 MED ORDER — FENTANYL CITRATE (PF) 100 MCG/2ML IJ SOLN
25.0000 ug | INTRAMUSCULAR | Status: DC | PRN
  Administered 2023-12-08: 50 ug via INTRAVENOUS
  Administered 2023-12-08 (×2): 25 ug via INTRAVENOUS

## 2023-12-08 MED ORDER — PREGABALIN 75 MG PO CAPS
75.0000 mg | ORAL_CAPSULE | Freq: Two times a day (BID) | ORAL | Status: DC
Start: 2023-12-08 — End: 2023-12-09
  Administered 2023-12-08 – 2023-12-09 (×3): 75 mg via ORAL
  Filled 2023-12-08 (×3): qty 1

## 2023-12-08 MED ORDER — HYDROCODONE-ACETAMINOPHEN 5-325 MG PO TABS
1.0000 | ORAL_TABLET | ORAL | Status: DC | PRN
  Administered 2023-12-08 – 2023-12-09 (×4): 1 via ORAL
  Filled 2023-12-08 (×3): qty 1

## 2023-12-08 MED ORDER — ORAL CARE MOUTH RINSE: 15.0000 mL | Freq: Once | OROMUCOSAL | Status: AC

## 2023-12-08 MED ORDER — FENTANYL CITRATE (PF) 250 MCG/5ML IJ SOLN
INTRAMUSCULAR | Status: AC
  Filled 2023-12-08: qty 5

## 2023-12-08 MED ORDER — CEFAZOLIN SODIUM-DEXTROSE 2-4 GM/100ML-% IV SOLN
2.0000 g | Freq: Four times a day (QID) | INTRAVENOUS | Status: AC
  Administered 2023-12-08 (×2): 2 g via INTRAVENOUS
  Filled 2023-12-08 (×2): qty 100

## 2023-12-08 MED ORDER — ACETAMINOPHEN 650 MG RE SUPP: 650.0000 mg | RECTAL | Status: DC | PRN

## 2023-12-08 MED ORDER — ACETAMINOPHEN 500 MG PO TABS
1000.0000 mg | ORAL_TABLET | Freq: Once | ORAL | Status: AC
  Administered 2023-12-08: 1000 mg via ORAL
  Filled 2023-12-08: qty 2

## 2023-12-08 MED ORDER — CHLORHEXIDINE GLUCONATE 0.12 % MT SOLN
15.0000 mL | Freq: Once | OROMUCOSAL | Status: AC
  Administered 2023-12-08: 15 mL via OROMUCOSAL
  Filled 2023-12-08: qty 15

## 2023-12-08 MED ORDER — CARVEDILOL 6.25 MG PO TABS
6.2500 mg | ORAL_TABLET | Freq: Two times a day (BID) | ORAL | Status: DC
Start: 2023-12-08 — End: 2023-12-09
  Administered 2023-12-08 – 2023-12-09 (×2): 6.25 mg via ORAL
  Filled 2023-12-08 (×2): qty 1

## 2023-12-08 MED ORDER — ACETAMINOPHEN 325 MG PO TABS: 650.0000 mg | ORAL_TABLET | ORAL | Status: DC | PRN

## 2023-12-08 MED ORDER — VANCOMYCIN HCL 1000 MG IV SOLR
INTRAVENOUS | Status: DC | PRN
  Administered 2023-12-08: 1000 mg via TOPICAL

## 2023-12-08 MED ORDER — ROCURONIUM BROMIDE 10 MG/ML (PF) SYRINGE
PREFILLED_SYRINGE | INTRAVENOUS | Status: AC
  Filled 2023-12-08: qty 10

## 2023-12-08 MED ORDER — BUPIVACAINE HCL (PF) 0.25 % IJ SOLN
INTRAMUSCULAR | Status: AC
  Filled 2023-12-08: qty 30

## 2023-12-08 MED ORDER — ACETAMINOPHEN ER 650 MG PO TBCR: 650.0000 mg | EXTENDED_RELEASE_TABLET | Freq: Three times a day (TID) | ORAL | Status: DC | PRN

## 2023-12-08 MED ORDER — HYDRALAZINE HCL 50 MG PO TABS
50.0000 mg | ORAL_TABLET | Freq: Two times a day (BID) | ORAL | Status: DC
  Administered 2023-12-08 – 2023-12-09 (×2): 50 mg via ORAL
  Filled 2023-12-08 (×2): qty 1

## 2023-12-08 MED ORDER — HYDROCODONE-ACETAMINOPHEN 5-325 MG PO TABS
2.0000 | ORAL_TABLET | ORAL | Status: DC | PRN
  Filled 2023-12-08: qty 2

## 2023-12-08 MED ORDER — ROCURONIUM BROMIDE 10 MG/ML (PF) SYRINGE
PREFILLED_SYRINGE | INTRAVENOUS | Status: DC | PRN
  Administered 2023-12-08: 50 mg via INTRAVENOUS
  Administered 2023-12-08: 10 mg via INTRAVENOUS

## 2023-12-08 MED ORDER — DOCUSATE SODIUM 100 MG PO CAPS
100.0000 mg | ORAL_CAPSULE | Freq: Two times a day (BID) | ORAL | Status: DC
  Administered 2023-12-08 – 2023-12-09 (×2): 100 mg via ORAL
  Filled 2023-12-08 (×2): qty 1

## 2023-12-08 MED ORDER — OXYCODONE HCL 5 MG PO TABS
5.0000 mg | ORAL_TABLET | Freq: Once | ORAL | Status: AC | PRN
  Administered 2023-12-08: 5 mg via ORAL

## 2023-12-08 MED ORDER — FENTANYL CITRATE (PF) 250 MCG/5ML IJ SOLN
INTRAMUSCULAR | Status: DC | PRN
  Administered 2023-12-08: 50 ug via INTRAVENOUS

## 2023-12-08 MED ORDER — ALBUTEROL SULFATE (2.5 MG/3ML) 0.083% IN NEBU: 2.5000 mg | INHALATION_SOLUTION | RESPIRATORY_TRACT | Status: DC | PRN

## 2023-12-08 MED ORDER — OXYCODONE HCL 5 MG PO TABS
ORAL_TABLET | ORAL | Status: AC
  Filled 2023-12-08: qty 1

## 2023-12-08 MED ORDER — CARVEDILOL 12.5 MG PO TABS
6.2500 mg | ORAL_TABLET | Freq: Two times a day (BID) | ORAL | Status: DC
  Administered 2023-12-08: 6.25 mg via ORAL
  Filled 2023-12-08: qty 1

## 2023-12-08 MED ORDER — CEFAZOLIN SODIUM-DEXTROSE 2-4 GM/100ML-% IV SOLN
2.0000 g | INTRAVENOUS | Status: AC
Start: 2023-12-08 — End: 2023-12-08
  Administered 2023-12-08: 2 g via INTRAVENOUS
  Filled 2023-12-08: qty 100

## 2023-12-08 MED ORDER — LIDOCAINE 2% (20 MG/ML) 5 ML SYRINGE
INTRAMUSCULAR | Status: DC | PRN
  Administered 2023-12-08: 80 mg via INTRAVENOUS

## 2023-12-08 MED ORDER — MENTHOL 3 MG MT LOZG: 1.0000 | LOZENGE | OROMUCOSAL | Status: DC | PRN

## 2023-12-08 MED ORDER — TRANEXAMIC ACID-NACL 1000-0.7 MG/100ML-% IV SOLN
INTRAVENOUS | Status: DC | PRN
  Administered 2023-12-08: 1000 mg via INTRAVENOUS

## 2023-12-08 MED ORDER — HYDROMORPHONE HCL 1 MG/ML IJ SOLN
INTRAMUSCULAR | Status: AC
  Filled 2023-12-08: qty 1

## 2023-12-08 MED ORDER — DOCUSATE SODIUM 100 MG PO CAPS
100.0000 mg | ORAL_CAPSULE | Freq: Two times a day (BID) | ORAL | 2 refills | Status: AC
  Filled 2023-12-08 (×2): qty 60, 30d supply, fill #0

## 2023-12-08 MED ORDER — DONEPEZIL HCL 10 MG PO TABS
10.0000 mg | ORAL_TABLET | Freq: Every day | ORAL | Status: DC
  Filled 2023-12-08: qty 1

## 2023-12-08 MED ORDER — GABAPENTIN 300 MG PO CAPS
300.0000 mg | ORAL_CAPSULE | Freq: Every day | ORAL | Status: DC
  Administered 2023-12-08: 300 mg via ORAL
  Filled 2023-12-08: qty 1

## 2023-12-08 SURGICAL SUPPLY — 60 items
BAG COUNTER SPONGE SURGICOUNT (BAG) ×1 IMPLANT
BENZOIN TINCTURE PRP APPL 2/3 (GAUZE/BANDAGES/DRESSINGS) IMPLANT
BNDG ELASTIC 6X10 VLCR STRL LF (GAUZE/BANDAGES/DRESSINGS) IMPLANT
BNDG GAUZE DERMACEA FLUFF 4 (GAUZE/BANDAGES/DRESSINGS) ×1 IMPLANT
BUR ROUND PRECISION 4.0 (BURR) ×1 IMPLANT
CABLE BIPOLOR RESECTION CORD (MISCELLANEOUS) ×1 IMPLANT
CANISTER SUCTION 3000ML PPV (SUCTIONS) ×1 IMPLANT
COVER SURGICAL LIGHT HANDLE (MISCELLANEOUS) ×1 IMPLANT
DRAPE POUCH INSTRU U-SHP 10X18 (DRAPES) ×2 IMPLANT
DRAPE SURG 17X23 STRL (DRAPES) ×4 IMPLANT
DRSG TEGADERM 4X4.5 CHG (GAUZE/BANDAGES/DRESSINGS) IMPLANT
DURAPREP 26ML APPLICATOR (WOUND CARE) ×1 IMPLANT
ELECT CAUTERY BLADE 6.4 (BLADE) ×1 IMPLANT
ELECTRODE BLDE 4.0 EZ CLN MEGD (MISCELLANEOUS) ×1 IMPLANT
ELECTRODE REM PT RTRN 9FT ADLT (ELECTROSURGICAL) ×1 IMPLANT
EVACUATOR SILICONE 100CC (DRAIN) IMPLANT
FILTER STRAW FLUID ASPIR (MISCELLANEOUS) ×1 IMPLANT
GAUZE 4X4 16PLY ~~LOC~~+RFID DBL (SPONGE) ×2 IMPLANT
GAUZE SPONGE 4X4 12PLY STRL (GAUZE/BANDAGES/DRESSINGS) ×1 IMPLANT
GLOVE BIO SURGEON STRL SZ 6.5 (GLOVE) ×1 IMPLANT
GLOVE BIO SURGEON STRL SZ8 (GLOVE) ×1 IMPLANT
GLOVE BIOGEL PI IND STRL 7.0 (GLOVE) ×1 IMPLANT
GLOVE BIOGEL PI IND STRL 8 (GLOVE) ×1 IMPLANT
GOWN STRL REUS W/ TWL LRG LVL3 (GOWN DISPOSABLE) ×1 IMPLANT
GOWN STRL REUS W/ TWL XL LVL3 (GOWN DISPOSABLE) ×2 IMPLANT
IV CATH 14GX2 1/4 (CATHETERS) ×1 IMPLANT
KIT BASIN OR (CUSTOM PROCEDURE TRAY) ×1 IMPLANT
KIT POSITIONER JACKSON TABLE (MISCELLANEOUS) ×1 IMPLANT
KIT TURNOVER KIT B (KITS) ×1 IMPLANT
MARKER SKIN DUAL TIP RULER LAB (MISCELLANEOUS) ×1 IMPLANT
NDL 18GX1X1/2 (RX/OR ONLY) (NEEDLE) ×1 IMPLANT
NDL 22X1.5 STRL (OR ONLY) (MISCELLANEOUS) ×1 IMPLANT
NDL HYPO 25GX1X1/2 BEV (NEEDLE) ×1 IMPLANT
NDL SPNL 18GX3.5 QUINCKE PK (NEEDLE) ×2 IMPLANT
NEEDLE 18GX1X1/2 (RX/OR ONLY) (NEEDLE) ×1 IMPLANT
NEEDLE 22X1.5 STRL (OR ONLY) (MISCELLANEOUS) ×1 IMPLANT
NEEDLE HYPO 25GX1X1/2 BEV (NEEDLE) ×1 IMPLANT
NEEDLE SPNL 18GX3.5 QUINCKE PK (NEEDLE) ×2 IMPLANT
PACK LAMINECTOMY ORTHO (CUSTOM PROCEDURE TRAY) ×1 IMPLANT
PACK UNIVERSAL I (CUSTOM PROCEDURE TRAY) ×1 IMPLANT
PAD ARMBOARD POSITIONER FOAM (MISCELLANEOUS) ×2 IMPLANT
PATTIES SURGICAL .5 X.5 (GAUZE/BANDAGES/DRESSINGS) IMPLANT
PATTIES SURGICAL .5 X1 (DISPOSABLE) ×1 IMPLANT
SOLN 0.9% NACL POUR BTL 1000ML (IV SOLUTION) ×1 IMPLANT
SOLN STERILE WATER BTL 1000 ML (IV SOLUTION) ×1 IMPLANT
SPONGE INTESTINAL PEANUT (DISPOSABLE) ×1 IMPLANT
SPONGE SURGIFOAM ABS GEL SZ50 (HEMOSTASIS) ×1 IMPLANT
STRIP CLOSURE SKIN 1/2X4 (GAUZE/BANDAGES/DRESSINGS) IMPLANT
SURGIFLO W/THROMBIN 8M KIT (HEMOSTASIS) IMPLANT
SUT MNCRL AB 4-0 PS2 18 (SUTURE) ×1 IMPLANT
SUT VIC AB 0 CT1 18XCR BRD 8 (SUTURE) IMPLANT
SUT VIC AB 1 CT1 18XCR BRD 8 (SUTURE) ×1 IMPLANT
SUT VIC AB 2-0 CT2 18 VCP726D (SUTURE) ×1 IMPLANT
SYR 20ML LL LF (SYRINGE) ×1 IMPLANT
SYR BULB IRRIG 60ML STRL (SYRINGE) ×1 IMPLANT
SYR CONTROL 10ML LL (SYRINGE) ×2 IMPLANT
SYR TB 1ML LUER SLIP (SYRINGE) ×4 IMPLANT
TOWEL GREEN STERILE (TOWEL DISPOSABLE) ×1 IMPLANT
TOWEL GREEN STERILE FF (TOWEL DISPOSABLE) ×1 IMPLANT
YANKAUER SUCT BULB TIP NO VENT (SUCTIONS) ×1 IMPLANT

## 2023-12-08 NOTE — Anesthesia Postprocedure Evaluation (Signed)
 Anesthesia Post Note  Patient: [name]  Procedure(s) Performed: DECOMPRESSIVE LUMBAR LAMINECTOMY LEVEL 1     Patient location during evaluation: PACU Anesthesia Type: General Level of consciousness: awake Pain management: pain level controlled Vital Signs Assessment: post-procedure vital signs reviewed and stable Respiratory status: spontaneous breathing, nonlabored ventilation and respiratory function stable Cardiovascular status: blood pressure returned to baseline and stable Postop Assessment: no apparent nausea or vomiting Anesthetic complications: no   No notable events documented.  Last Vitals:  Vitals:   12/08/23 1115 12/08/23 1130  BP: (!) 186/88 (!) 190/78  Pulse: (!) 57 (!) 58  Resp: (!) 9 12  Temp:    SpO2: 94% 97%    Last Pain:  Vitals:   12/08/23 1145  TempSrc:   PainSc: Asleep                 [name]

## 2023-12-08 NOTE — H&P (Signed)
 [name] is an 83 y.o. female.   Chief Complaint: Low back and bilateral leg pain HPI: Patient is a very pleasant 84 year old with a history significant low back and leg pain send for neurogenic claudication spinal stenosis.  She has been through extensive conservative care.  Spine is here for surgical management  Past Medical History:  Diagnosis Date   ACS (acute coronary syndrome) (HCC) 12/27/2010   AKI (acute kidney injury) 03/25/2018   Anginal pain    Arthritis    Asthma    Diabetes mellitus    Type 1   Dysrhythmia    irregular heart beat   GERD (gastroesophageal reflux disease)    History of urinary tract infection    Hypercholesteremia    Hypertension    left renal ca dx'd 2012 (?)   surg only, left kidney   Nocturia    Reflux    Renal failure (ARF), acute on chronic    Shortness of breath dyspnea    pt denies; states can climb stairs w/o difficulty    Sleep apnea    does not use c-pap machine   Tingling in extremities    legs bilat   Tinnitus    Urinary frequency    Urinary incontinence    Vertigo    occurs when lying flat     Past Surgical History:  Procedure Laterality Date   ABDOMINAL HYSTERECTOMY     APPENDECTOMY     CARDIAC CATHETERIZATION N/A 07/28/2014   Procedure: Left Heart Cath and Coronary Angiography;  Surgeon: Gordy Bergamo, MD;  Location: [facility] INVASIVE CV LAB;  Service: Cardiovascular;  Laterality: N/A;   CYSTOSCOPY WITH RETROGRADE PYELOGRAM, URETEROSCOPY AND STENT PLACEMENT Right 02/09/2015   Procedure: CYSTOSCOPY WITH RETROGRADE PYELOGRAM AND URETEROSCOPY ;  Surgeon: Gretel Ferrara, MD;  Location: [facility] ORS;  Service: Urology;  Laterality: Right;   LAPAROSCOPIC NEPHRECTOMY Right 03/20/2015   Procedure: LAPAROSCOPIC NEPHRECTOMY;  Surgeon: Gretel Ferrara, MD;  Location: [facility] ORS;  Service: Urology;  Laterality: Right;   PERIPHERAL VASCULAR CATHETERIZATION N/A 07/28/2014   Procedure: Aortic Arch Angiography;  Surgeon: Gordy Bergamo, MD;  Location: [facility] INVASIVE CV  LAB;  Service: Cardiovascular;  Laterality: N/A;   RENAL MASS EXCISION     SPINE SURGERY      Family History  Problem Relation Age of Onset   Hypertension Mother    Coronary artery disease Other    Breast cancer Cousin    Social History:  reports that she has never smoked. She has never used smokeless tobacco. She reports that she does not drink alcohol and does not use drugs.  Allergies:  Allergies  Allergen Reactions   Sulfa Antibiotics Itching    Medications Prior to Admission  Medication Sig Dispense Refill   acetaminophen  (TYLENOL  8 HOUR) 650 MG CR tablet Take 1 tablet (650 mg total) by mouth every 8 (eight) hours as needed for pain. 15 tablet 0   amLODipine  (NORVASC ) 10 MG tablet Take 1 tablet (10 mg total) by mouth daily. 90 tablet 3   aspirin  EC 81 MG tablet Take 81 mg by mouth at bedtime.     atorvastatin  (LIPITOR) 10 MG tablet Take 1 tablet (10 mg total) by mouth every morning. 90 tablet 3   carvedilol  (COREG ) 6.25 MG tablet Take 1 tablet (6.25 mg total) by mouth 2 (two) times daily. 60 tablet 0   donepezil  (ARICEPT ) 10 MG tablet Take 1 tablet (10 mg total) by mouth daily. 90 tablet 1   furosemide  (LASIX ) 20  MG tablet Take 1 tablet (20 mg total) by mouth daily for fluid. 90 tablet 1   gabapentin  (NEURONTIN ) 300 MG capsule Take 1 capsule (300 mg total) by mouth at bedtime. 90 capsule 0   hydrALAZINE  (APRESOLINE ) 50 MG tablet Take 1 tablet (50 mg total) by mouth 2 (two) times daily. 180 tablet 1   insulin  aspart (NOVOLOG  FLEXPEN) 100 UNIT/ML FlexPen Inject 1-2 Units into the skin twice daily before lunch and supper. Discard 28 days after first use. 6 mL 5   insulin  degludec (TRESIBA  FLEXTOUCH) 200 UNIT/ML FlexTouch Pen Inject 26 Units into the skin daily. (Patient taking differently: Inject 25 Units into the skin daily. Per patient she takes 36 units in the am) 12 mL 0   Multiple Vitamins-Minerals (CENTRUM SILVER  ADULT 50+ PO) Take 1 tablet by mouth daily.     pregabalin  (LYRICA) 75 MG capsule Take 1 capsule (75 mg total) by mouth 2 (two) times daily. 60 capsule 0   albuterol  (PROVENTIL ,VENTOLIN ) 90 MCG/ACT inhaler Inhale 2 puffs into the lungs every 4 (four) hours as needed for wheezing or shortness of breath.      Continuous Glucose Sensor (DEXCOM G7 SENSOR) MISC Use to check blood sugar as directed. Change every 10 days. 3 each 5   Glucagon  (BAQSIMI  TWO PACK) 3 MG/DOSE POWD Use as needed for severe hypoglycemia Nasally 2 each 1   HYDROcodone -acetaminophen  (NORCO/VICODIN) 5-325 MG tablet Take 0.5 tablets by mouth every 6 (six) to 8 (eight) hours as needed. (Patient not taking: Reported on 12/05/2023) 30 tablet 0    Results for orders placed or performed during the hospital encounter of 12/08/23 (from the past 48 hours)  Glucose, capillary     Status: Abnormal   Collection Time: 12/08/23  6:08 AM  Result Value Ref Range   Glucose-Capillary 115 (H) 70 - 99 mg/dL    Comment: Glucose reference range applies only to samples taken after fasting for at least 8 hours.   No results found.  Review of systems negative except as mention above for 12 systems  Blood pressure (!) 167/56, pulse 60, temperature 98.4 F (36.9 C), temperature source Oral, resp. rate 17, height 4' 11 (1.499 m), weight 64 kg, SpO2 97%. Physical examination: Well-appearing woman no acute distress Alert and oriented Normocephalic atraumatic Breathing comfortably on room air Heart rate is regular on the cardiac monitor Abdomen soft 5 out of 5 strength about lower extremities, including the EHL, FHL, gastrocs, tibialis anterior Intact station in the L3 pleasant dermatomes bilaterally   Assessment/Plan Lumbar stenosis with neurogenic claudication  The patient continues to improve pain in low back buttock thighs legs consistent with neurogenic claudication.  Will plan proceed with surgery today.  This be a lumbar laminectomy  [name], MD 12/08/2023, 7:17 AM

## 2023-12-08 NOTE — Discharge Instructions (Signed)
 DISCHARGE INSTRUCTIONS LUMBAR LAMINECTOMY / DISCECTOMY  INCISION  Please make sure your incisions are checked at least twice daily for signs and symptoms of infection: If any of the below should occur, please call the office. Drainage from incisional site Opening of incisions Fevers greater than 101.3 Flu-like symptoms Increased redness and/or tenderness   If you have staples or sutures (not tape) in your incision they may be removed 2 weeks following  your surgery.  This is usually done in the office.  SHOWERING  You may shower as normal once the large, bulky bandages are removed from your incisions.  If they  are not removed before your discharge from the hospital, you may remove them 2 days after your  surgery.  Hair washing is permissible while in the shower.  No tub baths, hot tubs or whirlpools until  seen in the office.     EXERCISE Lift objects weighing less than 10-15 lbs Do not bend or twist at the waist-always bend your knees!! Limit your sitting to 20-30 minute intervals.  You should lie down or walk in between sitting periods.  There are no limitations for sitting in a recliner chair. Walk as much as possible-let discomfort be your guide.  You may also go up and down stairs as much as you can tolerate.  Walking outside (as long as it is nice weather) or walking on a treadmill is permitted (no incline).    PAIN Take pain medication as prescribed. As your pain level decreases, you may begin to take over-the-counter Extra Strength Tylenol .  Non-steroidal anti-inflammatory medications (NSAIDs) such as Advil, Aleve or Motrin are also ok.    DRIVING  You may not drive a car until told otherwise by your physician (usually at your first office visit).   You may be a passenger for short distances (20-30 minutes).  If you must take a longer trip, make sure to make several pit stops so that you can walk around and stretch your legs.  Reclining the passenger seat seems to be the most  comfortable position for most patients.        FOLLOW-UP APPOINTMENTS A follow up appointment should already have been scheduled.  If it wasn't or if you have any questions, please CALL [phone]   Cordella Rhein, MD, MS [facility] [phone]

## 2023-12-08 NOTE — Progress Notes (Signed)
 Patient c/o itching all over, 25 mg benadryl  given.

## 2023-12-08 NOTE — Progress Notes (Addendum)
 PATIENT NAME: [name]   MEDICAL RECORD NO.:   [id number]    DATE OF BIRTH: DOB@    DATE OF PROCEDURE: TD@                                OPERATIVE REPORT   PREOPERATIVE DIAGNOSES: 1. Bilateral leg pain. 2. Neurogenic claudication. 3. Severe spinal stenosis, L4-5  POSTOPERATIVE DIAGNOSES: 1. Bilateral leg pain. 2. Neurogenic claudication. 3. Severe spinal stenosis, L4-5  PROCEDURE:  L4-5 laminectomy with bilateral partial facetectomy and bilateral foraminotomy.  SURGEON:  Cordella Rhein, MD, Stefanie.  ASSISTANT:   ANESTHESIA:  General endotracheal anesthesia.  COMPLICATIONS:  None.  DISPOSITION:  Stable.  ESTIMATED BLOOD LOSS:  50  INDICATIONS FOR SURGERY:   [name]   is a very pleasant 83 year-old patient, who did present to me with pain in the bilateral legs. The patient's MRI did reveal spinal stenosis at L4-5.  We did proceed with appropriate conservative treatment, but the patient did continue to have ongoing pain, which he did feel was limiting his function substantially.  Given the patient's ongoing pain and dysfunction, we did discuss proceeding with the procedure reflected above.  The patient was fully aware of the risks and limitations of surgery and did wish to proceed.  The risks include but are not limited to bleeding, infection, injury to the nerves, injury to the dura, CSF leakage, iatrogenic instability, recurrent stenosis, failure to resolve the pre-operative symptoms and the need for additional surgery.    OPERATIVE DETAILS:  On 12/08/2023, the patient was brought to surgery and general endotracheal anesthesia was administered.  The patient was placed prone on a well-padded flat Jackson bed. Antibiotics were given and the back was prepped and draped in the usual sterile fashion.  A time-out procedure was performed.  The appropriate level was identified with lateral fluoroscopic imaging.  The skin was anesthetized with 0.25%marcaine  with epi.  I  then made a midline incision with a 10 blade.  Electrocautery was used to dissect to the level of the fascia.  The fascia was incised at the midline.  The paraspinal musculature was dissected using a Cobb and electrocautery.  A self retaining retractor was placed.  Lateral fluoroscopic imaging was used to confirm the L4-5 levels.  A rongeur was used to remove the L4 and L5 spinous processes.  A burr was used to remove additional lamina and the medial facets with care take to preserve the pars.  A dental probe was used to dissect under the lamina additional bone was removed with a 4-0 Kerrison.  The dental was then used to separate the ligamentum and develop the plane between it and the dura.  A kerrison was then used to remove the ligament and remaining facet joints.  Dissection was carried out into the foramen until a hockey stick style probe could be passed freely into the foramen.  The spinal canal was entirely decompressed.  A final film confirmed the 4-L4-5 levels.  All bleeding was then adequately controlled.  The wound was copiously irrigated. There was no extravasation of cerebrospinal fluid noted throughout the entire surgery.  Vancomycin  was placed into the wound.  The fascia was closed with a At this point, the wound was #1 Vicryl.  2-0 Vicryl was used for the deep dermal layer, followed by 4-0 Monocryl for the skin.  Benzoin and Steri-Strips were applied, followed by a sterile dressing.  All instrument  counts were correct at the termination of the procedure.  The patient was awoken from anesthesia and transferred to the PACU in stable condition.     Cordella Rhein, MD, [facility] [phone]

## 2023-12-08 NOTE — Anesthesia Procedure Notes (Signed)
 Procedure Name: Intubation Date/Time: 12/08/2023 8:01 AM  Performed by: Mannie Krystal LABOR, CRNAPre-anesthesia Checklist: Patient identified, Emergency Drugs available, Suction available and Patient being monitored Patient Re-evaluated:Patient Re-evaluated prior to induction Oxygen Delivery Method: Circle system utilized Preoxygenation: Pre-oxygenation with 100% oxygen Induction Type: IV induction Ventilation: Mask ventilation without difficulty and Oral airway inserted - appropriate to patient size Laryngoscope Size: Cleotilde and 2 Grade View: Grade I Tube type: Oral Tube size: 7.0 mm Number of attempts: 1 Airway Equipment and Method: Stylet and Oral airway Placement Confirmation: ETT inserted through vocal cords under direct vision, positive ETCO2 and breath sounds checked- equal and bilateral Secured at: 22 cm Tube secured with: Tape Dental Injury: Teeth and Oropharynx as per pre-operative assessment

## 2023-12-08 NOTE — Transfer of Care (Signed)
 Immediate Anesthesia Transfer of Care Note  Patient: [name]  Procedure(s) Performed: DECOMPRESSIVE LUMBAR LAMINECTOMY LEVEL 1  Patient Location: PACU  Anesthesia Type:General  Level of Consciousness: awake, alert , and oriented  Airway & Oxygen Therapy: Patient Spontanous Breathing and Patient connected to face mask oxygen  Post-op Assessment: Report given to RN and Post -op Vital signs reviewed and stable  Post vital signs: Reviewed and stable  Last Vitals:  Vitals Value Taken Time  BP    Temp    Pulse 59 12/08/23 09:15  Resp 11 12/08/23 09:15  SpO2 100 % 12/08/23 09:15  Vitals shown include unfiled device data.  Last Pain:  Vitals:   12/08/23 0633  TempSrc:   PainSc: 0-No pain      Patients Stated Pain Goal: 1 (12/08/23 9374)  Complications: No notable events documented.

## 2023-12-09 ENCOUNTER — Other Ambulatory Visit (HOSPITAL_COMMUNITY): Payer: Self-pay

## 2023-12-09 ENCOUNTER — Other Ambulatory Visit: Payer: Self-pay

## 2023-12-09 ENCOUNTER — Encounter (HOSPITAL_COMMUNITY): Payer: Self-pay

## 2023-12-09 DIAGNOSIS — M48062 Spinal stenosis, lumbar region with neurogenic claudication: Secondary | ICD-10-CM | POA: Diagnosis not present

## 2023-12-09 DIAGNOSIS — J45909 Unspecified asthma, uncomplicated: Secondary | ICD-10-CM | POA: Diagnosis not present

## 2023-12-09 DIAGNOSIS — Z79899 Other long term (current) drug therapy: Secondary | ICD-10-CM | POA: Diagnosis not present

## 2023-12-09 DIAGNOSIS — Z7982 Long term (current) use of aspirin: Secondary | ICD-10-CM | POA: Diagnosis not present

## 2023-12-09 DIAGNOSIS — N189 Chronic kidney disease, unspecified: Secondary | ICD-10-CM | POA: Diagnosis not present

## 2023-12-09 DIAGNOSIS — E1022 Type 1 diabetes mellitus with diabetic chronic kidney disease: Secondary | ICD-10-CM | POA: Diagnosis not present

## 2023-12-09 DIAGNOSIS — Z794 Long term (current) use of insulin: Secondary | ICD-10-CM | POA: Diagnosis not present

## 2023-12-09 LAB — BASIC METABOLIC PANEL WITH GFR
Anion gap: 9 (ref 5–15)
BUN: 32 mg/dL — ABNORMAL HIGH (ref 8–23)
CO2: 23 mmol/L (ref 22–32)
Calcium: 8.8 mg/dL — ABNORMAL LOW (ref 8.9–10.3)
Chloride: 101 mmol/L (ref 98–111)
Creatinine, Ser: 2.07 mg/dL — ABNORMAL HIGH (ref 0.44–1.00)
GFR, Estimated: 23 mL/min — ABNORMAL LOW (ref >60)
Glucose, Bld: 242 mg/dL — ABNORMAL HIGH (ref 70–99)
Potassium: 4.6 mmol/L (ref 3.5–5.1)
Sodium: 133 mmol/L — ABNORMAL LOW (ref 135–145)

## 2023-12-09 LAB — GLUCOSE, CAPILLARY
Glucose-Capillary: 217 mg/dL — ABNORMAL HIGH (ref 70–99)
Glucose-Capillary: 277 mg/dL — ABNORMAL HIGH (ref 70–99)

## 2023-12-09 LAB — CBC
HCT: 34.5 % — ABNORMAL LOW (ref 36.0–46.0)
Hemoglobin: 11.2 g/dL — ABNORMAL LOW (ref 12.0–15.0)
MCH: 31.1 pg (ref 26.0–34.0)
MCHC: 32.5 g/dL (ref 30.0–36.0)
MCV: 95.8 fL (ref 80.0–100.0)
Platelets: 224 K/uL (ref 150–400)
RBC: 3.6 MIL/uL — ABNORMAL LOW (ref 3.87–5.11)
RDW: 12.6 % (ref 11.5–15.5)
WBC: 8.4 K/uL (ref 4.0–10.5)
nRBC: 0 % (ref 0.0–0.2)

## 2023-12-09 NOTE — Care Management Obs Status (Signed)
 MEDICARE OBSERVATION STATUS NOTIFICATION   Patient Details  Name: [name] MRN: [id number] Date of Birth: 04-16-1940   Medicare Observation Status Notification Given:  Yes    Jon Cruel 12/09/2023, 10:05 AM

## 2023-12-09 NOTE — Plan of Care (Signed)
 Pt and daughter given D/C instructions with verbal understanding. Rx's was sent to the pharmacy by MD. Pt's incision is clean and dry with no sign of infection. Pt's IV was removed prior to D/C. Home Health was arranged by TOC. Pt received 3-n-1 from [facility] per MD order. Pt D/C'd home via wheelchair per MD order. Pt is stable @ D/C and has no other needs at this time. Rosina Rakers, RN

## 2023-12-09 NOTE — Progress Notes (Addendum)
 Subjective: Procedure(s) (LRB): DECOMPRESSIVE LUMBAR LAMINECTOMY LEVEL 1 (N/A) 1 Day Post-Op  Patient reports pain as 3 on 0-10 scale.  Reports decreased leg pain reports incisional back pain   Positive void Negative bowel movement Positive flatus Negative chest pain or shortness of breath  The patient reports that her back pain has improved.  Her leg pain is doing well.  She has been  Objective: Vital signs in last 24 hours: Temp:  [97.6 F (36.4 C)-99.7 F (37.6 C)] 99 F (37.2 C) (10/21 0333) Pulse Rate:  [56-74] 63 (10/21 0333) Resp:  [7-20] 18 (10/21 0333) BP: (121-196)/(46-97) 121/73 (10/21 0333) SpO2:  [90 %-100 %] 100 % (10/21 0333)  Intake/Output from previous day: 10/20 0701 - 10/21 0700 In: 2140 [P.O.:1440; I.V.:600; IV Piggyback:100] Out: 427 [Urine:402; Blood:25]  Labs: Recent Labs    12/09/23 0516  WBC 8.4  RBC 3.60*  HCT 34.5*  PLT 224   Recent Labs    12/09/23 0516  NA 133*  K 4.6  CL 101  CO2 23  BUN 32*  CREATININE 2.07*  GLUCOSE 242*  CALCIUM  8.8*   No results for input(s): LABPT, INR in the last 72 hours.  Physical Exam: Neurologically intact Body mass index is 28.48 kg/m.  Incision C/D/I  Sensation     Right      Left  L3   Intact     Intact L4   Intact     Intact L5    Intact     Intact S1   Intact     Intact    Motor Exam     Right     Left Iliopsoas  5/5     5/5  Quad   5/5     5/5  Hamstring  5/5     5/5  Tibials Anterior 5/5     5/5  EHL   5/5     5/5  Gastrocs  5/5     5/5      Assessment/Plan: Patient stable   Continue mobilization with physical therapy Continue care  Up with therapy Discharge home with home health  The patient is doing well 1 day after surgery.  Pain is improved.  She will mobilize with physical therapy.  If she has no therapy, consider discharge her home later on today.  She feels that she is okay to resume Tube.  Will continue with her teds SCDs.  Continue current bowel  regimen.  Her medications were e-prescribed through the office.  Stefanie Rhein, MD, MS [name] [phone]

## 2023-12-09 NOTE — Evaluation (Addendum)
 Occupational Therapy Evaluation/Discharge Patient Details Name: [name] MRN: [id number] DOB: 1940-08-13 Today's Date: 12/09/2023   History of Present Illness   Pt is an 83 y/o female admitted for L4-5 laminectomy on 10/20 in setting of neurogenic claudication and severe spinal stenosis at L4-5 levels. PMH: DM1, GERD, HTN, sleep apnea, vertigo     Clinical Impressions PTA, pt lives alone, typically Modified Independent with ADLs, IADLs, driving and mobility with intermittent RW use. Pt presents now with minor pain s/p spinal sx above. Educated on back precautions for ADLs, bed mobility and general body mechanics. Pt able to manage ADLs with Modified Independence to Supervision; bathroom mobility with Supervision using RW. Pt reports daughter from [place]  available to assist until Thursday- encouraged pt to have her assist with initial showering tasks and heavier IADLs. As pt lives alone and typically very high level, would recommend HHOT follow up to facilitate safe return to ADL/IADL independence. Will defer further skilled OT needs to [facility] therapies.      If plan is discharge home, recommend the following:   Assistance with cooking/housework;Assist for transportation;Help with stairs or ramp for entrance     Functional Status Assessment   Patient has had a recent decline in their functional status and demonstrates the ability to make significant improvements in function in a reasonable and predictable amount of time.     Equipment Recommendations   BSC/3in1     Recommendations for Other Services         Precautions/Restrictions   Precautions Precautions: Fall;Back Precaution Booklet Issued: Yes (comment) Precaution/Restrictions Comments: no brace needed Restrictions Weight Bearing Restrictions Per Provider Order: No     Mobility Bed Mobility Overal bed mobility: Needs Assistance Bed Mobility: Rolling, Sidelying to Sit, Sit to Supine Rolling: Contact guard  assist Sidelying to sit: Min assist   Sit to supine: Min assist   General bed mobility comments: cued for log rolling with tactile cues to sequence. Assist to lift trunk to EOB and assist for BLE back to bed    Transfers Overall transfer level: Needs assistance Equipment used: Rolling walker (2 wheels) Transfers: Sit to/from Stand Sit to Stand: Supervision           General transfer comment: increased time/effort though no assist needed      Balance Overall balance assessment: Needs assistance Sitting-balance support: No upper extremity supported, Feet supported Sitting balance-Leahy Scale: Fair     Standing balance support: Bilateral upper extremity supported, No upper extremity supported, During functional activity Standing balance-Leahy Scale: Fair                             ADL either performed or assessed with clinical judgement   ADL Overall ADL's : Needs assistance/impaired Eating/Feeding: Independent   Grooming: Modified independent;Standing;Oral care Grooming Details (indicate cue type and reason): cued to use cup to rinse/spit to minimize bending- no issues Upper Body Bathing: Modified independent   Lower Body Bathing: Supervison/ safety;Sitting/lateral leans;Sit to/from stand   Upper Body Dressing : Modified independent;Sitting Upper Body Dressing Details (indicate cue type and reason): able to manage bra and shirt Lower Body Dressing: Supervision/safety;Sitting/lateral leans;Sit to/from stand Lower Body Dressing Details (indicate cue type and reason): increased time/effort, able to don pants Toilet Transfer: Supervision/safety;Ambulation;Rolling walker (2 wheels)   Toileting- Clothing Manipulation and Hygiene: Supervision/safety;Sit to/from stand;Sitting/lateral lean       Functional mobility during ADLs: Supervision/safety;Rolling walker (2 wheels)  Vision Ability to See in Adequate Light: 0 Adequate Patient Visual Report: No  change from baseline Vision Assessment?: No apparent visual deficits     Perception         Praxis         Pertinent Vitals/Pain Pain Assessment Pain Assessment: Faces Faces Pain Scale: Hurts little more Pain Location: back Pain Descriptors / Indicators: Grimacing, Guarding Pain Intervention(s): Monitored during session, Limited activity within patient's tolerance, Premedicated before session     Extremity/Trunk Assessment Upper Extremity Assessment Upper Extremity Assessment: Overall WFL for tasks assessed;Right hand dominant   Lower Extremity Assessment Lower Extremity Assessment: Defer to PT evaluation   Cervical / Trunk Assessment Cervical / Trunk Assessment: Back Surgery   Communication Communication Communication: No apparent difficulties   Cognition Arousal: Alert Behavior During Therapy: WFL for tasks assessed/performed Cognition: No apparent impairments                               Following commands: Intact       Cueing  General Comments   Cueing Techniques: Verbal cues      Exercises     Shoulder Instructions      Home Living Family/patient expects to be discharged to:: Private residence Living Arrangements: Alone Available Help at Discharge: Other (Comment) (daughter from [place]  staying until Thursday) Type of Home: House Home Access: Stairs to enter [employer] of Steps: prior hospitalization, reports 3 steps. Today, pt reports 15 steps to kitchen/first floor   Home Layout: Two level Alternate Level Stairs-Number of Steps: 13 steps to bedroom Alternate Level Stairs-Rails: Right;Left;Can reach both Bathroom Shower/Tub: Producer, television/film/video: Standard Bathroom Accessibility: Yes   Home Equipment: Agricultural consultant (2 wheels);Shower seat;Adaptive equipment Adaptive Equipment: Reacher        Prior Functioning/Environment Prior Level of Function : Independent/Modified Independent;Driving              Mobility Comments: mod I with intermittent use of RW, no AD for community navigation ADLs Comments: mod I for all bathing, dressing, medication management, light house work, take out for food a majority of the time. driving    OT Problem List:     OT Treatment/Interventions:        OT Goals(Current goals can be found in the care plan section)   Acute Rehab OT Goals Patient Stated Goal: pain control, do ok at home OT Goal Formulation: With patient Time For Goal Achievement: 12/23/23 Potential to Achieve Goals: Good   OT Frequency:       Co-evaluation              AM-PAC OT 6 Clicks Daily Activity     Outcome Measure Help from another person eating meals?: None Help from another person taking care of personal grooming?: None Help from another person toileting, which includes using toliet, bedpan, or urinal?: A Little Help from another person bathing (including washing, rinsing, drying)?: A Little Help from another person to put on and taking off regular upper body clothing?: None Help from another person to put on and taking off regular lower body clothing?: A Little 6 Click Score: 21   End of Session Equipment Utilized During Treatment: Rolling walker (2 wheels) Nurse Communication: Mobility status  Activity Tolerance: Patient tolerated treatment well Patient left: in bed;with call bell/phone within reach  OT Visit Diagnosis: Pain Pain - part of body:  (back)  Time: 9247-9183 OT Time Calculation (min): 24 min Charges:  OT General Charges $OT Visit: 1 Visit OT Evaluation $OT Eval Low Complexity: 1 Low  [name], OTR/L Acute Rehab Services Office: [phone]   Stefanie Fish 12/09/2023, 8:30 AM

## 2023-12-09 NOTE — TOC Transition Note (Signed)
 Transition of Care [facility]) - Discharge Note   Patient Details  Name: CAMERYN SCHUM MRN: [id number] Date of Birth: 07-02-1940  [facility]) CM/SW Contact:  Andrez JULIANNA George, RN Phone Number: 12/09/2023, 10:23 AM   Clinical Narrative:     Pt is discharging home with resumption of home health services through [facility]. Information on the AVS. [facility] will contact you for the next home visit.  Pt has transportation home.  Final next level of care: Home w Home Health Services Barriers to Discharge: No Barriers Identified   Patient Goals and CMS Choice   CMS Medicare.gov Compare Post Acute Care list provided to:: Patient Choice offered to / list presented to : Patient      Discharge Placement                       Discharge Plan and Services Additional resources added to the After Visit Summary for                            [facility] Arranged: PT, OT [facility] Agency: [facility] Date [facility] Agency Contacted: 12/09/23   Representative spoke with at [facility] Agency: Amy  Social Drivers of Health (SDOH) Interventions SDOH Screenings   Food Insecurity: No Food Insecurity (11/13/2023)  Housing: Low Risk  (11/13/2023)  Transportation Needs: No Transportation Needs (11/13/2023)  Utilities: Not At Risk (11/13/2023)  Social Connections: Moderately Integrated (11/12/2023)  Tobacco Use: Low Risk  (12/05/2023)     Readmission Risk Interventions     No data to display

## 2023-12-09 NOTE — Discharge Summary (Signed)
 Physician Discharge Summary  Patient ID: JANCIE KERCHER MRN: [id number] DOB/AGE: 1940-11-24 83 y.o.  Admit date: 12/08/2023 Discharge date: 12/09/2023  Admission Diagnoses:  Discharge Diagnoses:  Principal Problem:   Spinal stenosis of lumbar region with neurogenic claudication   Discharged Condition: good  Hospital Course: Patient was admitted to the hospital on December 08, 2023.  She underwent a lumbar laminectomy.  Please see the operative note for more details on the surgical procedure.  She was admitted overnight for pain control and physical therapy.  She was placed on bowel regimen.  Her pain is controlled with oral agents.  She was cleared for discharge after being seen by physical therapy with home health.  Consults: None    Discharge Exam: Blood pressure (!) 157/59, pulse 61, temperature 98.6 F (37 C), temperature source Oral, resp. rate 16, height 4' 11 (1.499 m), weight 64 kg, SpO2 99%. Well-appearing woman no acute distress  Intact sensation in the L3-S1 dermatomes  5/5 strength in the bilateral lower extremities  Disposition: Home with home health  Discharge Instructions     Incentive spirometry RT   Complete by: As directed         Contact information for after-discharge care     Home Medical Care     [facility]) .   Service: Home Health Services Contact information: [address] [place]  [phone]                     Signed: [name] 12/09/2023, 1:46 PM

## 2023-12-09 NOTE — Evaluation (Signed)
 Physical Therapy Evaluation  Patient Details Name: [name] MRN: [id number] DOB: September 13, 1940 Today's Date: 12/09/2023  History of Present Illness  Pt is an 83 y/o female admitted for L4-5 laminectomy on 10/20 in setting of neurogenic claudication and severe spinal stenosis at L4-5 levels. PMH: DM1, GERD, HTN, sleep apnea, vertigo  Clinical Impression  Pt admitted with above diagnosis. At the time of PT eval, pt was able to demonstrate transfers and ambulation with gross CGA to min assist and RW for support. Pt was educated on precautions, brace application/wearing schedule, appropriate activity progression, and car transfer. Pt currently with functional limitations due to the deficits listed below (see PT Problem List). Pt will benefit from skilled PT to increase their independence and safety with mobility to allow discharge to the venue listed below.          If plan is discharge home, recommend the following: A little help with walking and/or transfers;A little help with bathing/dressing/bathroom;Assistance with cooking/housework;Assist for transportation;Help with stairs or ramp for entrance   Can travel by private vehicle        Equipment Recommendations BSC/3in1  Recommendations for Other Services       Functional Status Assessment Patient has had a recent decline in their functional status and demonstrates the ability to make significant improvements in function in a reasonable and predictable amount of time.     Precautions / Restrictions Precautions Precautions: Fall;Back Precaution Booklet Issued: Yes (comment) Recall of Precautions/Restrictions: Impaired Precaution/Restrictions Comments: Reviewed handout and pt was cued for precautions during functional mobility. Restrictions Weight Bearing Restrictions Per Provider Order: No      Mobility  Bed Mobility Overal bed mobility: Needs Assistance Bed Mobility: Rolling, Sidelying to Sit, Sit to Supine Rolling: Contact  guard assist Sidelying to sit: Contact guard assist   Sit to supine: Min assist   General bed mobility comments: VC's for optimal log roll technique. Hands on guarding to guide pt through. Assist for LE elevation back up into bed at end of session.    Transfers Overall transfer level: Needs assistance Equipment used: Rolling walker (2 wheels) Transfers: Sit to/from Stand Sit to Stand: Supervision           General transfer comment: VC's for hand placement on seated surface for safety.    Ambulation/Gait Ambulation/Gait assistance: Contact guard assist Gait Distance (Feet): 200 Feet Assistive device: Rolling walker (2 wheels) Gait Pattern/deviations: Step-through pattern, Decreased stride length, Trunk flexed Gait velocity: Decreased Gait velocity interpretation: 1.31 - 2.62 ft/sec, indicative of limited community ambulator   General Gait Details: VC's for improved posture, closer walker proximity and forward gaze. No overt LOB noted. Appears steady with the RW for support.  Stairs Stairs: Yes Stairs assistance: Min assist Stair Management: One rail Right, Step to pattern, Forwards Number of Stairs: 10 General stair comments: Daughter present for education and assisted pt with negotiating a flight of stairs. VC's throughout for sequencing and safety. PT guarding both pt and daughter.  Wheelchair Mobility     Tilt Bed    Modified Rankin (Stroke Patients Only)       Balance Overall balance assessment: Needs assistance Sitting-balance support: No upper extremity supported, Feet supported Sitting balance-Leahy Scale: Fair     Standing balance support: Bilateral upper extremity supported, No upper extremity supported, During functional activity Standing balance-Leahy Scale: Fair  Pertinent Vitals/Pain Pain Assessment Pain Assessment: Faces Faces Pain Scale: Hurts little more Pain Location: back Pain Descriptors /  Indicators: Grimacing, Guarding Pain Intervention(s): Limited activity within patient's tolerance, Monitored during session, Repositioned    Home Living Family/patient expects to be discharged to:: Private residence Living Arrangements: Alone Available Help at Discharge: Other (Comment) (daughter from [place]  staying until Thursday) Type of Home: House Home Access: Stairs to enter   [employer] of Steps: prior hospitalization, reports 3 steps. Today, pt reports 15 steps to kitchen/first floor Alternate Level Stairs-Number of Steps: 13 steps to bedroom Home Layout: Two level Home Equipment: Agricultural consultant (2 wheels);Shower seat;Adaptive equipment Additional Comments: reacher    Prior Function Prior Level of Function : Independent/Modified Independent;Driving             Mobility Comments: mod I with intermittent use of RW, no AD for community navigation ADLs Comments: mod I for all bathing, dressing, medication management, light house work, take out for food a majority of the time. driving     Extremity/Trunk Assessment   Upper Extremity Assessment Upper Extremity Assessment: Defer to OT evaluation    Lower Extremity Assessment Lower Extremity Assessment: Generalized weakness    Cervical / Trunk Assessment Cervical / Trunk Assessment: Back Surgery  Communication   Communication Communication: No apparent difficulties    Cognition Arousal: Alert Behavior During Therapy: WFL for tasks assessed/performed   PT - Cognitive impairments: No apparent impairments                         Following commands: Intact       Cueing Cueing Techniques: Verbal cues     General Comments      Exercises     Assessment/Plan    PT Assessment Patient needs continued PT services  PT Problem List Decreased strength;Decreased activity tolerance;Decreased balance;Decreased mobility;Decreased knowledge of use of DME;Decreased safety awareness;Decreased  knowledge of precautions;Pain       PT Treatment Interventions DME instruction;Gait training;Stair training;Therapeutic activities;Functional mobility training;Therapeutic exercise;Balance training;Patient/family education    PT Goals (Current goals can be found in the Care Plan section)  Acute Rehab PT Goals Patient Stated Goal: Be able to go to choir practice PT Goal Formulation: With patient/family Time For Goal Achievement: 12/16/23 Potential to Achieve Goals: Good    Frequency Min 5X/week     Co-evaluation               AM-PAC PT 6 Clicks Mobility  Outcome Measure Help needed turning from your back to your side while in a flat bed without using bedrails?: A Little Help needed moving from lying on your back to sitting on the side of a flat bed without using bedrails?: A Little Help needed moving to and from a bed to a chair (including a wheelchair)?: A Little Help needed standing up from a chair using your arms (e.g., wheelchair or bedside chair)?: A Little Help needed to walk in hospital room?: A Little Help needed climbing 3-5 steps with a railing? : A Little 6 Click Score: 18    End of Session Equipment Utilized During Treatment: Gait belt Activity Tolerance: Patient tolerated treatment well Patient left: in bed;with call bell/phone within reach;with family/visitor present Nurse Communication: Mobility status PT Visit Diagnosis: Unsteadiness on feet (R26.81);Pain Pain - part of body:  (back)    Time: 8886-8852 PT Time Calculation (min) (ACUTE ONLY): 34 min   Charges:   PT Evaluation $PT Eval Low Complexity: 1  Low PT Treatments $Gait Training: 8-22 mins PT General Charges $$ ACUTE PT VISIT: 1 Visit         Leita Sable, PT, DPT Acute Rehabilitation Services Secure Chat Preferred Office: [phone]   Leita JONETTA Sable 12/09/2023, 1:55 PM

## 2023-12-10 ENCOUNTER — Other Ambulatory Visit: Payer: Self-pay

## 2023-12-10 ENCOUNTER — Other Ambulatory Visit (HOSPITAL_COMMUNITY): Payer: Self-pay

## 2023-12-12 ENCOUNTER — Other Ambulatory Visit (HOSPITAL_COMMUNITY): Payer: Self-pay

## 2023-12-15 ENCOUNTER — Other Ambulatory Visit (HOSPITAL_COMMUNITY): Payer: Self-pay

## 2023-12-15 MED ORDER — OXYCODONE-ACETAMINOPHEN 5-325 MG PO TABS
1.0000 | ORAL_TABLET | Freq: Four times a day (QID) | ORAL | 0 refills | Status: DC | PRN
  Filled 2023-12-15: qty 30, 8d supply, fill #0

## 2023-12-16 ENCOUNTER — Encounter (HOSPITAL_COMMUNITY): Payer: Self-pay

## 2023-12-16 ENCOUNTER — Observation Stay (HOSPITAL_COMMUNITY): Admission: EM | Admit: 2023-12-16 | Discharge: 2023-12-26 | Disposition: A | Discharge status: Home / Self Care

## 2023-12-16 ENCOUNTER — Other Ambulatory Visit: Payer: Self-pay

## 2023-12-16 DIAGNOSIS — Z7982 Long term (current) use of aspirin: Secondary | ICD-10-CM | POA: Insufficient documentation

## 2023-12-16 DIAGNOSIS — I10 Essential (primary) hypertension: Secondary | ICD-10-CM | POA: Diagnosis not present

## 2023-12-16 DIAGNOSIS — E109 Type 1 diabetes mellitus without complications: Secondary | ICD-10-CM | POA: Insufficient documentation

## 2023-12-16 DIAGNOSIS — M4800 Spinal stenosis, site unspecified: Secondary | ICD-10-CM | POA: Diagnosis not present

## 2023-12-16 DIAGNOSIS — J45909 Unspecified asthma, uncomplicated: Secondary | ICD-10-CM | POA: Diagnosis not present

## 2023-12-16 DIAGNOSIS — M545 Low back pain, unspecified: Secondary | ICD-10-CM | POA: Diagnosis not present

## 2023-12-16 DIAGNOSIS — Z79899 Other long term (current) drug therapy: Secondary | ICD-10-CM | POA: Diagnosis not present

## 2023-12-16 DIAGNOSIS — M48061 Spinal stenosis, lumbar region without neurogenic claudication: Principal | ICD-10-CM | POA: Diagnosis present

## 2023-12-16 LAB — GLUCOSE, CAPILLARY: Glucose-Capillary: 92 mg/dL (ref 70–99)

## 2023-12-16 MED ORDER — SENNOSIDES-DOCUSATE SODIUM 8.6-50 MG PO TABS
1.0000 | ORAL_TABLET | Freq: Every evening | ORAL | Status: DC | PRN
Start: 2023-12-16 — End: 2023-12-26
  Administered 2023-12-19: 1 via ORAL
  Filled 2023-12-16: qty 1

## 2023-12-16 MED ORDER — OXYCODONE-ACETAMINOPHEN 5-325 MG PO TABS
1.0000 | ORAL_TABLET | ORAL | Status: DC | PRN
Start: 2023-12-16 — End: 2023-12-26
  Administered 2023-12-17 – 2023-12-25 (×6): 1 via ORAL
  Filled 2023-12-16 (×7): qty 1

## 2023-12-16 MED ORDER — OXYCODONE-ACETAMINOPHEN 5-325 MG PO TABS
2.0000 | ORAL_TABLET | ORAL | Status: DC | PRN
  Administered 2023-12-16 – 2023-12-26 (×8): 2 via ORAL
  Filled 2023-12-16 (×9): qty 2

## 2023-12-16 MED ORDER — DOCUSATE SODIUM 100 MG PO CAPS
100.0000 mg | ORAL_CAPSULE | Freq: Two times a day (BID) | ORAL | Status: DC
  Administered 2023-12-16 – 2023-12-26 (×20): 100 mg via ORAL
  Filled 2023-12-16 (×20): qty 1

## 2023-12-16 NOTE — Plan of Care (Signed)
  Problem: Education: Goal: Knowledge of General Education information will improve Description: Including pain rating scale, medication(s)/side effects and non-pharmacologic comfort measures Outcome: Not Progressing   Problem: Health Behavior/Discharge Planning: Goal: Ability to manage health-related needs will improve Outcome: Not Progressing   Problem: Clinical Measurements: Goal: Ability to maintain clinical measurements within normal limits will improve Outcome: Not Progressing   Problem: Activity: Goal: Risk for activity intolerance will decrease Outcome: Not Progressing   Problem: Coping: Goal: Level of anxiety will decrease Outcome: Not Progressing   

## 2023-12-16 NOTE — Progress Notes (Signed)
 21:30 Pm: Patient arrived to the unit. RN note that patient does have a dexcom on her LUE at this time.

## 2023-12-17 DIAGNOSIS — Z7982 Long term (current) use of aspirin: Secondary | ICD-10-CM | POA: Diagnosis not present

## 2023-12-17 DIAGNOSIS — I10 Essential (primary) hypertension: Secondary | ICD-10-CM | POA: Diagnosis not present

## 2023-12-17 DIAGNOSIS — J45909 Unspecified asthma, uncomplicated: Secondary | ICD-10-CM | POA: Diagnosis not present

## 2023-12-17 DIAGNOSIS — Z79899 Other long term (current) drug therapy: Secondary | ICD-10-CM | POA: Diagnosis not present

## 2023-12-17 DIAGNOSIS — E109 Type 1 diabetes mellitus without complications: Secondary | ICD-10-CM | POA: Diagnosis not present

## 2023-12-17 DIAGNOSIS — M4800 Spinal stenosis, site unspecified: Secondary | ICD-10-CM | POA: Diagnosis not present

## 2023-12-17 LAB — GLUCOSE, CAPILLARY
Glucose-Capillary: 132 mg/dL — ABNORMAL HIGH (ref 70–99)
Glucose-Capillary: 174 mg/dL — ABNORMAL HIGH (ref 70–99)
Glucose-Capillary: 261 mg/dL — ABNORMAL HIGH (ref 70–99)
Glucose-Capillary: 242 mg/dL — ABNORMAL HIGH (ref 70–99)

## 2023-12-17 MED ORDER — ATORVASTATIN CALCIUM 10 MG PO TABS
10.0000 mg | ORAL_TABLET | Freq: Every morning | ORAL | Status: DC
  Administered 2023-12-17 – 2023-12-26 (×10): 10 mg via ORAL
  Filled 2023-12-17 (×10): qty 1

## 2023-12-17 MED ORDER — HYDRALAZINE HCL 50 MG PO TABS
50.0000 mg | ORAL_TABLET | Freq: Two times a day (BID) | ORAL | Status: DC
  Administered 2023-12-17 – 2023-12-26 (×19): 50 mg via ORAL
  Filled 2023-12-17 (×20): qty 1

## 2023-12-17 MED ORDER — INSULIN GLARGINE-YFGN 100 UNIT/ML ~~LOC~~ SOLN
25.0000 [IU] | Freq: Every day | SUBCUTANEOUS | Status: DC
  Administered 2023-12-17 – 2023-12-26 (×10): 25 [IU] via SUBCUTANEOUS
  Filled 2023-12-17 (×10): qty 0.25

## 2023-12-17 MED ORDER — DONEPEZIL HCL 10 MG PO TABS
10.0000 mg | ORAL_TABLET | Freq: Every day | ORAL | Status: DC
  Administered 2023-12-17 – 2023-12-26 (×10): 10 mg via ORAL
  Filled 2023-12-17 (×10): qty 1

## 2023-12-17 MED ORDER — CARVEDILOL 6.25 MG PO TABS
6.2500 mg | ORAL_TABLET | Freq: Two times a day (BID) | ORAL | Status: DC
Start: 2023-12-17 — End: 2023-12-26
  Administered 2023-12-17 – 2023-12-26 (×11): 6.25 mg via ORAL
  Filled 2023-12-17 (×15): qty 1

## 2023-12-17 MED ORDER — ASPIRIN 81 MG PO TBEC
81.0000 mg | DELAYED_RELEASE_TABLET | Freq: Every day | ORAL | Status: DC
  Administered 2023-12-17 – 2023-12-25 (×10): 81 mg via ORAL
  Filled 2023-12-17 (×10): qty 1

## 2023-12-17 MED ORDER — FUROSEMIDE 40 MG PO TABS
20.0000 mg | ORAL_TABLET | Freq: Every day | ORAL | Status: DC
  Administered 2023-12-17 – 2023-12-26 (×10): 20 mg via ORAL
  Filled 2023-12-17 (×10): qty 1

## 2023-12-17 MED ORDER — INSULIN ASPART 100 UNIT/ML IJ SOLN
0.0000 [IU] | Freq: Three times a day (TID) | INTRAMUSCULAR | Status: DC
  Administered 2023-12-17: 8 [IU] via SUBCUTANEOUS
  Administered 2023-12-18: 2 [IU] via SUBCUTANEOUS
  Administered 2023-12-18 (×2): 8 [IU] via SUBCUTANEOUS
  Administered 2023-12-19: 5 [IU] via SUBCUTANEOUS
  Administered 2023-12-19: 3 [IU] via SUBCUTANEOUS
  Administered 2023-12-19: 5 [IU] via SUBCUTANEOUS
  Administered 2023-12-20: 3 [IU] via SUBCUTANEOUS
  Administered 2023-12-20: 11 [IU] via SUBCUTANEOUS
  Administered 2023-12-21: 2 [IU] via SUBCUTANEOUS
  Administered 2023-12-21: 8 [IU] via SUBCUTANEOUS
  Administered 2023-12-21: 3 [IU] via SUBCUTANEOUS
  Administered 2023-12-22: 8 [IU] via SUBCUTANEOUS
  Administered 2023-12-22: 3 [IU] via SUBCUTANEOUS
  Administered 2023-12-23: 8 [IU] via SUBCUTANEOUS
  Administered 2023-12-23: 2 [IU] via SUBCUTANEOUS
  Administered 2023-12-23 – 2023-12-24 (×2): 8 [IU] via SUBCUTANEOUS
  Administered 2023-12-24: 11 [IU] via SUBCUTANEOUS
  Administered 2023-12-24: 8 [IU] via SUBCUTANEOUS
  Administered 2023-12-25: 5 [IU] via SUBCUTANEOUS
  Administered 2023-12-26: 2 [IU] via SUBCUTANEOUS
  Filled 2023-12-17: qty 2
  Filled 2023-12-17: qty 5
  Filled 2023-12-17 (×2): qty 8
  Filled 2023-12-17: qty 1
  Filled 2023-12-17: qty 8
  Filled 2023-12-17: qty 11
  Filled 2023-12-17 (×2): qty 1
  Filled 2023-12-17: qty 3
  Filled 2023-12-17 (×2): qty 8
  Filled 2023-12-17 (×2): qty 2

## 2023-12-17 MED ORDER — INSULIN ASPART 100 UNIT/ML IJ SOLN
0.0000 [IU] | Freq: Every day | INTRAMUSCULAR | Status: DC
  Administered 2023-12-17: 2 [IU] via SUBCUTANEOUS
  Administered 2023-12-18 – 2023-12-20 (×2): 3 [IU] via SUBCUTANEOUS
  Administered 2023-12-21: 2 [IU] via SUBCUTANEOUS
  Administered 2023-12-22 – 2023-12-24 (×2): 4 [IU] via SUBCUTANEOUS
  Administered 2023-12-25: 5 [IU] via SUBCUTANEOUS
  Filled 2023-12-17: qty 4
  Filled 2023-12-17: qty 3
  Filled 2023-12-17: qty 2
  Filled 2023-12-17: qty 5

## 2023-12-17 MED ORDER — INSULIN ASPART 100 UNIT/ML IJ SOLN: 1.0000 [IU] | Freq: Two times a day (BID) | INTRAMUSCULAR | Status: DC

## 2023-12-17 MED ORDER — ALBUTEROL SULFATE (2.5 MG/3ML) 0.083% IN NEBU: 2.5000 mg | INHALATION_SOLUTION | RESPIRATORY_TRACT | Status: DC | PRN

## 2023-12-17 MED ORDER — ADULT MULTIVITAMIN W/MINERALS CH
1.0000 | ORAL_TABLET | Freq: Every day | ORAL | Status: DC
  Administered 2023-12-17 – 2023-12-26 (×10): 1 via ORAL
  Filled 2023-12-17 (×10): qty 1

## 2023-12-17 MED ORDER — AMLODIPINE BESYLATE 10 MG PO TABS
10.0000 mg | ORAL_TABLET | Freq: Every day | ORAL | Status: DC
  Administered 2023-12-17 – 2023-12-26 (×10): 10 mg via ORAL
  Filled 2023-12-17 (×10): qty 1

## 2023-12-17 MED ORDER — PREGABALIN 75 MG PO CAPS
75.0000 mg | ORAL_CAPSULE | Freq: Two times a day (BID) | ORAL | Status: DC
  Administered 2023-12-17 – 2023-12-26 (×20): 75 mg via ORAL
  Filled 2023-12-17 (×20): qty 1

## 2023-12-17 MED ORDER — GABAPENTIN 300 MG PO CAPS
300.0000 mg | ORAL_CAPSULE | Freq: Every day | ORAL | Status: DC
  Administered 2023-12-17 – 2023-12-25 (×10): 300 mg via ORAL
  Filled 2023-12-17 (×10): qty 1

## 2023-12-17 MED ORDER — DOCUSATE SODIUM 100 MG PO CAPS: 100.0000 mg | ORAL_CAPSULE | Freq: Two times a day (BID) | ORAL | Status: DC

## 2023-12-17 NOTE — H&P (Signed)
 Orthopaedic Service H&P/Consult     Patient ID: [name] MRN: [id number] DOB/AGE: 1940/07/24 83 y.o.  Chief Complaint: Low back and right leg pain HPI: [name] is an 83 y.o. female.  Who underwent a lumbar laminectomy approxi-1 week ago.  She did well initially and was able to be discharged to home.  However while at home she developed increasing pain in the back and the right leg.  Her family was able to stay with her for the first few days after surgery but they were having to return to their homes in [place]  and in [place] and were no longer to help provide home support for her.  She was able to care for herself.  I saw her in the office yesterday and given the significant pain, limited ability ambulate and inability to perform her ADLs I recommended admission to the hospital.  Past Medical History:  Diagnosis Date   ACS (acute coronary syndrome) (HCC) 12/27/2010   AKI (acute kidney injury) 03/25/2018   Anginal pain    Arthritis    Asthma    Diabetes mellitus    Type 1   Dysrhythmia    irregular heart beat   GERD (gastroesophageal reflux disease)    History of urinary tract infection    Hypercholesteremia    Hypertension    left renal ca dx'd 2012 (?)   surg only, left kidney   Nocturia    Reflux    Renal failure (ARF), acute on chronic    Shortness of breath dyspnea    pt denies; states can climb stairs w/o difficulty    Sleep apnea    does not use c-pap machine   Tingling in extremities    legs bilat   Tinnitus    Urinary frequency    Urinary incontinence    Vertigo    occurs when lying flat     Past Surgical History:  Procedure Laterality Date   ABDOMINAL HYSTERECTOMY     APPENDECTOMY     CARDIAC CATHETERIZATION N/A 07/28/2014   Procedure: Left Heart Cath and Coronary Angiography;  Surgeon: Gordy Bergamo, MD;  Location: [facility] INVASIVE CV LAB;  Service: Cardiovascular;  Laterality: N/A;   CYSTOSCOPY WITH RETROGRADE PYELOGRAM, URETEROSCOPY AND STENT PLACEMENT  Right 02/09/2015   Procedure: CYSTOSCOPY WITH RETROGRADE PYELOGRAM AND URETEROSCOPY ;  Surgeon: Gretel Ferrara, MD;  Location: [facility] ORS;  Service: Urology;  Laterality: Right;   DECOMPRESSIVE LUMBAR LAMINECTOMY LEVEL 1 N/A 12/08/2023   Procedure: DECOMPRESSIVE LUMBAR LAMINECTOMY LEVEL 1;  Surgeon: Reyne Cordella SQUIBB, MD;  Location: [facility] OR;  Service: Orthopedics;  Laterality: N/A;  L4-5 LUMBAR LAMINECTOMY  JACKSON TABLE   LAPAROSCOPIC NEPHRECTOMY Right 03/20/2015   Procedure: LAPAROSCOPIC NEPHRECTOMY;  Surgeon: Gretel Ferrara, MD;  Location: [facility] OR[facility];  Service: Urology;  Laterality: Right;   PERIPHERAL VASCULAR CATHETERIZATION N/A 07/28/2014   Procedure: Aortic Arch Angiography;  Surgeon: Gordy Bergamo, MD;  Location: [facility] INV[facility]IVE CV LAB;  Service: Cardiovascular;  Laterality: N/A;   RENAL MASS EXCISION     SPINE SURGERY      Family History  Problem Relation Age of Onset   Hypertension Mother    Coronary artery disease Other    Breast cancer Cousin    Social History:  reports that she has never smoked. She has never used smokeless tobacco. She reports that she does not drink alcohol and does not use drugs.  Allergies:  Allergies  Allergen Reactions   Sulfa Antibiotics Itching    Medications Prior to  Admission  Medication Sig Dispense Refill   acetaminophen  (TYLENOL  8 HOUR) 650 MG CR tablet Take 1 tablet (650 mg total) by mouth every 8 (eight) hours as needed for pain. 15 tablet 0   albuterol  (PROVENTIL ,VENTOLIN ) 90 MCG/ACT inhaler Inhale 2 puffs into the lungs every 4 (four) hours as needed for wheezing or shortness of breath.      amLODipine  (NORVASC ) 10 MG tablet Take 1 tablet (10 mg total) by mouth daily. 90 tablet 3   aspirin  EC 81 MG tablet Take 81 mg by mouth at bedtime.     atorvastatin  (LIPITOR) 10 MG tablet Take 1 tablet (10 mg total) by mouth every morning. 90 tablet 3   carvedilol  (COREG ) 6.25 MG tablet Take 1 tablet (6.25 mg total) by mouth 2 (two) times daily. 60 tablet 0   Continuous  Glucose Sensor (DEXCOM G7 SENSOR) MISC Use to check blood sugar as directed. Change every 10 days. 3 each 5   docusate sodium  (COLACE) 100 MG capsule Take 1 capsule (100 mg total) by mouth 2 (two) times daily. 60 capsule 2   donepezil  (ARICEPT ) 10 MG tablet Take 1 tablet (10 mg total) by mouth daily. 90 tablet 1   furosemide  (LASIX ) 20 MG tablet Take 1 tablet (20 mg total) by mouth daily for fluid. 90 tablet 1   gabapentin  (NEURONTIN ) 300 MG capsule Take 1 capsule (300 mg total) by mouth at bedtime. 90 capsule 0   Glucagon  (BAQSIMI  TWO PACK) 3 MG/DOSE POWD Use as needed for severe hypoglycemia Nasally 2 each 1   hydrALAZINE  (APRESOLINE ) 50 MG tablet Take 1 tablet (50 mg total) by mouth 2 (two) times daily. 180 tablet 1   insulin  aspart (NOVOLOG  FLEXPEN) 100 UNIT/ML FlexPen Inject 1-2 Units into the skin twice daily before lunch and supper. Discard 28 days after first use. 6 mL 5   insulin  degludec (TRESIBA  FLEXTOUCH) 200 UNIT/ML FlexTouch Pen Inject 26 Units into the skin daily. (Patient taking differently: Inject 25 Units into the skin daily. Per patient she takes 36 units in the am) 12 mL 0   Multiple Vitamins-Minerals (CENTRUM SILVER  ADULT 50+ PO) Take 1 tablet by mouth daily.     oxyCODONE -acetaminophen  (PERCOCET) 5-325 MG tablet Take 1 tablet by mouth every 6 (six) hours as needed. 30 tablet 0   pregabalin (LYRICA) 75 MG capsule Take 1 capsule (75 mg total) by mouth 2 (two) times daily. 60 capsule 0    Results for orders placed or performed during the hospital encounter of 12/16/23 (from the past 48 hours)  Glucose, capillary     Status: None   Collection Time: 12/16/23 10:09 PM  Result Value Ref Range   Glucose-Capillary 92 70 - 99 mg/dL    Comment: Glucose reference range applies only to samples taken after fasting for at least 8 hours.   Comment 1 Notify RN    Comment 2 Document in Chart    No results found.    Blood pressure (!) 145/56, pulse 68, temperature 98.6 F (37 C),  temperature source Oral, resp. rate 16, SpO2 97%.  Well-appearing woman in no acute distress. Alert and oriented x 3 Mood is calm Normocephalic atraumatic Heart rate is regular Breathing comfortably on room air Abdomen is soft and supple Intact sensation in the L3-S1 dermatomes bilaterally 5/5 strength bilaterally in the EHL, FHL, tibialis anterior Healed surgical incision in the lumbar spine   Assessment/Plan  The patient is 1 week after a lumbar laminectomy.  She still has a  fair bit of pain and was unable to care for herself as her home support to return to their homes.  At this point we will have her work with physical therapy.  Will anticipate discharge to a skilled nursing facility/rehab.  Will see how she does with therapy and then consult case management for further arrangements for discharge to a rehab facility.  Hopefully she can discharge tomorrow  -(ortho injury):  - Pain management:  Percocet   - DVT/PE prophylaxis:  Teds and SCDs  - Dispo:  Rehab placement    Cordella Rhein, MD, MS [facility] [phone]

## 2023-12-17 NOTE — Care Management Obs Status (Signed)
 MEDICARE OBSERVATION STATUS NOTIFICATION   Patient Details  Name: [name] MRN: [id number] Date of Birth: [birth year]   Medicare Observation Status Notification Given:  Yes    Roxie KANDICE Stain, RN 12/17/2023, 12:35 PM

## 2023-12-17 NOTE — TOC CM/SW Note (Signed)
 Transition of Care [facility]) - Inpatient Brief Assessment   Patient Details  Name: [name] MRN: [id number] Date of Birth: 01/25/1941  Transition of [facility]) CM/SW Contact:    Lauraine FORBES Saa, LCSWA Phone Number: 12/17/2023, 9:15 AM   Clinical Narrative:  9:16 AM Per chart review, patient resides at home alone. Patient has a PCP and insurance. Patient does not have SNF history. [place] has HH history with [facility]. Patient has DME (RW, BSC, CPAP) history with [facility] and [facility]. Patient's preferred pharmacy's are [facility] and [facility] [address]. TOC consult was placed for SNF placement. TOC will continue to follow.  Transition of Care Asessment: Insurance and Status: Insurance coverage has been reviewed Patient has primary care physician: Yes Home environment has been reviewed: Private Residence Prior level of function:: N/A Prior/Current Home Services: No current home services Social Drivers of Health Review: SDOH reviewed no interventions necessary Readmission risk has been reviewed: Yes (Currently Yellow 15%) Transition of care needs: transition of care needs identified, TOC will continue to follow

## 2023-12-17 NOTE — Evaluation (Signed)
 Physical Therapy Evaluation Patient Details Name: [name] MRN: [id number] DOB: Oct 12, 1940 Today's Date: 12/17/2023  History of Present Illness  Pt is an 83 y/o female admitted for L4-5 laminectomy on 10/20 in setting of neurogenic claudication and severe spinal stenosis at L4-5 levels. She then was dc home and was seen in OP setting and was recommended to come back to the hospital due increase in pain and not able to complete ADLS. PMH: DM1, GERD, HTN, sleep apnea, vertigo  Clinical Impression   Pt admitted with above diagnosis. Lives alone, in a two-level home with 13 steps to enter her bedroom; Prior to initial  admission for laminectomy, pt was able to manage with a RW, and was independent; Much more difficulty managing at home after her family members had to leave, and she was alone at home; Presents to PT with decreased functional mobility, weakness, decr balance (with history of  vertigo), incr risk of falls, and with pain radiating down her R LE. Pt is eager to move, so a log roll was performed with mod assist and she was able to stand independently with walker. Pt was able to walk 25 ft using RW and CGA during the session, but struggled to maintain balance with head turns, needin gup to mod assist; Recommend SNF for post-acute rehab to maximize independence and safety with mobility and ADLs due to stair obstacle and current independence level; Pt currently with functional limitations due to the deficits listed below (see PT Problem List).          If plan is discharge home, recommend the following: A little help with walking and/or transfers;A little help with bathing/dressing/bathroom;Assistance with cooking/housework;Assist for transportation;Help with stairs or ramp for entrance   Can travel by private vehicle        Equipment Recommendations BSC/3in1  Recommendations for Other Services  OT consult    Functional Status Assessment Patient has had a recent decline in their  functional status and demonstrates the ability to make significant improvements in function in a reasonable and predictable amount of time.     Precautions / Restrictions Precautions Precautions: Fall;Back Recall of Precautions/Restrictions: Impaired Precaution/Restrictions Comments: Pt was only able toreport 1/3 precuations. Restrictions Weight Bearing Restrictions Per Provider Order: No      Mobility  Bed Mobility Overal bed mobility: Needs Assistance Bed Mobility: Rolling, Sidelying to Sit Rolling: Min assist Sidelying to sit: Mod assist, Used rails       General bed mobility comments: Cues for techniqueCues for technique and lifting assist to rise to sit    Transfers Overall transfer level: Needs assistance Equipment used: Rolling walker (2 wheels) Transfers: Sit to/from Stand Sit to Stand: Min assist           General transfer comment: cues for hand placement and safety    Ambulation/Gait Ambulation/Gait assistance: Contact guard assist, Mod assist Gait Distance (Feet): 25 Feet Assistive device: Rolling walker (2 wheels) Gait Pattern/deviations: Step-through pattern, Decreased stride length, Trunk flexed Gait velocity: Decreased     General Gait Details: Noting unsteady gait, with tendency for posterior lean, including RW tilt and occasionally with forward wheels off the ground; Decr balance and control with turns, requiring up to mod assist for stability and fall prevention  Stairs            Wheelchair Mobility     Tilt Bed    Modified Rankin (Stroke Patients Only)       Balance Overall balance assessment: Needs assistance Sitting-balance support:  Feet supported Sitting balance-Leahy Scale: Good     Standing balance support: Bilateral upper extremity supported, Single extremity supported, No upper extremity supported Standing balance-Leahy Scale: Fair Standing balance comment: has posterior tilt and needs min assist to correct                              Pertinent Vitals/Pain Pain Assessment Pain Assessment: 0-10 Pain Score: 3  Pain Location: back Pain Descriptors / Indicators: Grimacing, Guarding Pain Intervention(s): Monitored during session, Limited activity within patient's tolerance    Home Living Family/patient expects to be discharged to:: Private residence Living Arrangements: Alone   Type of Home: House Home Access: Stairs to enter Entrance Stairs-Rails: None Entrance Stairs-Number of Steps: 15 steps to kitchen/first floor Alternate Level Stairs-Number of Steps: 13 steps to bedroom Home Layout: Two level Home Equipment: Agricultural Consultant (2 wheels);Shower seat      Prior Function Prior Level of Function : Independent/Modified Independent             Mobility Comments: Pt reported ambulating in the home and made it to md visits. ADLs Comments: needed some assist but clear on how much     Extremity/Trunk Assessment   Upper Extremity Assessment Upper Extremity Assessment: Defer to OT evaluation    Lower Extremity Assessment Lower Extremity Assessment: Generalized weakness;RLE deficits/detail RLE Deficits / Details: Notable for radicular pain similar to pre-op    Cervical / Trunk Assessment Cervical / Trunk Assessment: Back Surgery  Communication   Communication Communication: No apparent difficulties    Cognition Arousal: Alert Behavior During Therapy: WFL for tasks assessed/performed                             Following commands: Intact       Cueing Cueing Techniques: Verbal cues     General Comments General comments (skin integrity, edema, etc.): Noting history of vertigo, which is likely contributing to instability and loss of balance with turns    Exercises     Assessment/Plan    PT Assessment Patient needs continued PT services  PT Problem List Decreased strength;Decreased activity tolerance;Decreased balance;Decreased mobility;Decreased  knowledge of use of DME;Decreased safety awareness;Decreased knowledge of precautions;Pain       PT Treatment Interventions DME instruction;Gait training;Stair training;Therapeutic activities;Functional mobility training;Therapeutic exercise;Balance training;Patient/family education    PT Goals (Current goals can be found in the Care Plan section)  Acute Rehab PT Goals Patient Stated Goal: Independent and safe at home PT Goal Formulation: With patient Time For Goal Achievement: 12/31/23 Potential to Achieve Goals: Good    Frequency Min 4X/week     Co-evaluation               AM-PAC PT 6 Clicks Mobility  Outcome Measure Help needed turning from your back to your side while in a flat bed without using bedrails?: A Little Help needed moving from lying on your back to sitting on the side of a flat bed without using bedrails?: A Lot Help needed moving to and from a bed to a chair (including a wheelchair)?: A Little Help needed standing up from a chair using your arms (e.g., wheelchair or bedside chair)?: A Lot Help needed to walk in hospital room?: A Little Help needed climbing 3-5 steps with a railing? : A Lot 6 Click Score: 15    End of Session Equipment Utilized During Treatment: Gait belt Activity  Tolerance: Patient tolerated treatment well Patient left: in chair;with call bell/phone within reach (about to work with OT) Nurse Communication: Mobility status PT Visit Diagnosis: Unsteadiness on feet (R26.81);Pain Pain - Right/Left: Right Pain - part of body: Leg (and back)    Time: 1013-1040 PT Time Calculation (min) (ACUTE ONLY): 27 min   Charges:   PT Evaluation $PT Eval Moderate Complexity: 1 Mod PT Treatments $Gait Training: 8-22 mins PT General Charges $$ ACUTE PT VISIT: 1 Visit         Silvano Currier, PT  Acute Rehabilitation Services Office [phone] Secure Chat welcomed   Silvano VEAR Currier 12/17/2023, 1:12 PM

## 2023-12-17 NOTE — Care Management CC44 (Signed)
 Condition Code 44 Documentation Completed  Patient Details  Name: [name] MRN: [id number] Date of Birth: [birth year]   Condition Code 44 given:  Yes Patient signature on Condition Code 44 notice:  Yes Documentation of 2 MD's agreement:  Yes Code 44 added to claim:  Yes    Roxie KANDICE Stain, RN 12/17/2023, 12:34 PM

## 2023-12-17 NOTE — Evaluation (Signed)
 Occupational Therapy Evaluation Patient Details Name: [name] MRN: [id number] DOB: [birth year] Today's Date: 12/17/2023   History of Present Illness   Pt is an 83 y/o female admitted for L4-5 laminectomy on 10/20 in setting of neurogenic claudication and severe spinal stenosis at L4-5 levels. She then was dc home and was seen in OP setting and was recommended to come back to the hospital due increase in pain and not able to complete ADLS. PMH: DM1, GERD, HTN, sleep apnea, vertigo     Clinical Impressions Pt presented finishing with Physical Therapy but agreeable to session. Pt reported since their family left they were mainly in bed/chair and attempting to ambulate with RW. When asking about falls they reported not yet as noted multiple times in session noted posterior tilt and needed min assist to ensure they did not have a further LOB when in standing. Pt was only able to recall 1/3 precaution and needed mod cues on position to transfer with min assist for sit to stand transfer. She then completed while standing with CGA to min assist. Patient will benefit from continued inpatient follow up therapy, <3 hours/day.     If plan is discharge home, recommend the following:   A little help with walking and/or transfers;A little help with bathing/dressing/bathroom;Assistance with cooking/housework;Assist for transportation;Help with stairs or ramp for entrance;Supervision due to cognitive status     Functional Status Assessment   Patient has had a recent decline in their functional status and demonstrates the ability to make significant improvements in function in a reasonable and predictable amount of time.     Equipment Recommendations    (TBD at next venue)     Recommendations for Other Services         Precautions/Restrictions   Precautions Precautions: Fall;Back Recall of Precautions/Restrictions: Impaired Precaution/Restrictions Comments: Pt was only able  toreport 1/3 precuations. Restrictions Weight Bearing Restrictions Per Provider Order: No     Mobility Bed Mobility               General bed mobility comments: presented with PT    Transfers Overall transfer level: Needs assistance Equipment used: Rolling walker (2 wheels) Transfers: Sit to/from Stand Sit to Stand: Min assist           General transfer comment: cues on how to complete      Balance Overall balance assessment: Needs assistance Sitting-balance support: Feet supported Sitting balance-Leahy Scale: Good     Standing balance support: Bilateral upper extremity supported, Single extremity supported, No upper extremity supported Standing balance-Leahy Scale: Fair Standing balance comment: has posterior tilt and needs min assist to coorect                           ADL either performed or assessed with clinical judgement   ADL Overall ADL's : Needs assistance/impaired Eating/Feeding: Independent   Grooming: Contact guard assist;Minimal assistance;Oral care   Upper Body Bathing: Set up;Sitting   Lower Body Bathing: Minimal assistance;Contact guard assist;Sit to/from stand   Upper Body Dressing : Set up;Sitting   Lower Body Dressing: Set up;Sit to/from stand   Toilet Transfer: Contact guard assist;Minimal assistance;Rolling walker (2 wheels)   Toileting- Clothing Manipulation and Hygiene: Contact guard assist;Sit to/from stand       Functional mobility during ADLs: Contact guard assist;Minimal assistance;Rolling walker (2 wheels)       Vision Ability to See in Adequate Light: 0 Adequate Patient Visual Report: No change from  baseline Vision Assessment?: No apparent visual deficits     Perception Perception: Within Functional Limits       Praxis Praxis: WFL       Pertinent Vitals/Pain Pain Assessment Pain Assessment: 0-10 Pain Score: 3  Pain Location: back Pain Descriptors / Indicators: Grimacing, Guarding Pain  Intervention(s): Limited activity within patient's tolerance, Monitored during session, Repositioned     Extremity/Trunk Assessment Upper Extremity Assessment Upper Extremity Assessment: Generalized weakness   Lower Extremity Assessment Lower Extremity Assessment: Defer to PT evaluation   Cervical / Trunk Assessment Cervical / Trunk Assessment: Back Surgery   Communication Communication Communication: No apparent difficulties   Cognition Arousal: Alert Behavior During Therapy: WFL for tasks assessed/performed               OT - Cognition Comments: further assess, they could not recall precuations and needs cue on how to complete things                 Following commands: Intact       Cueing  General Comments   Cueing Techniques: Verbal cues      Exercises     Shoulder Instructions      Home Living Family/patient expects to be discharged to:: Private residence Living Arrangements: Alone   Type of Home: House Home Access: Stairs to enter [employer] of Steps: 15 steps to kitchen/first floor Entrance Stairs-Rails: None Home Layout: Two level Alternate Level Stairs-Number of Steps: 13 steps to bedroom Alternate Level Stairs-Rails: Right;Left;Can reach both Bathroom Shower/Tub: Producer, Television/film/video: Standard Bathroom Accessibility: Yes   Home Equipment: Agricultural Consultant (2 wheels);Shower seat          Prior Functioning/Environment Prior Level of Function : Independent/Modified Independent             Mobility Comments: Pt reported ambulating in the home and made it to md visits. ADLs Comments: needed some assist but clear on how much    OT Problem List: Decreased safety awareness;Decreased knowledge of use of DME or AE;Pain;Decreased strength;Decreased activity tolerance   OT Treatment/Interventions: Self-care/ADL training;Therapeutic exercise;Therapeutic activities;Patient/family education;Balance training       OT Goals(Current goals can be found in the care plan section)   Acute Rehab OT Goals Patient Stated Goal: to get better OT Goal Formulation: With patient Time For Goal Achievement: 12/31/23 Potential to Achieve Goals: Good   OT Frequency:  Min 2X/week    Co-evaluation              AM-PAC OT 6 Clicks Daily Activity     Outcome Measure Help from another person eating meals?: None Help from another person taking care of personal grooming?: None Help from another person toileting, which includes using toliet, bedpan, or urinal?: A Little Help from another person bathing (including washing, rinsing, drying)?: A Little Help from another person to put on and taking off regular upper body clothing?: None Help from another person to put on and taking off regular lower body clothing?: A Little 6 Click Score: 21   End of Session Equipment Utilized During Treatment: Rolling walker (2 wheels);Gait belt Nurse Communication: Mobility status  Activity Tolerance: Patient tolerated treatment well Patient left: in chair;with call bell/phone within reach;with chair alarm set  OT Visit Diagnosis: Pain Pain - part of body:  (back)                Time: 1040-1105 OT Time Calculation (min): 25 min Charges:  OT General Charges $OT Visit:  1 Visit OT Evaluation $OT Eval Low Complexity: 1 Low OT Treatments $Self Care/Home Management : 8-22 mins  Stefanie POUR OTR/L  Acute Rehab Services  [phone] office number   [name] 12/17/2023, 11:10 AM

## 2023-12-18 ENCOUNTER — Encounter (INDEPENDENT_AMBULATORY_CARE_PROVIDER_SITE_OTHER): Admitting: Ophthalmology

## 2023-12-18 ENCOUNTER — Other Ambulatory Visit: Payer: Self-pay

## 2023-12-18 DIAGNOSIS — Z79899 Other long term (current) drug therapy: Secondary | ICD-10-CM | POA: Diagnosis not present

## 2023-12-18 DIAGNOSIS — M4800 Spinal stenosis, site unspecified: Secondary | ICD-10-CM | POA: Diagnosis not present

## 2023-12-18 DIAGNOSIS — E109 Type 1 diabetes mellitus without complications: Secondary | ICD-10-CM | POA: Diagnosis not present

## 2023-12-18 DIAGNOSIS — J45909 Unspecified asthma, uncomplicated: Secondary | ICD-10-CM | POA: Diagnosis not present

## 2023-12-18 DIAGNOSIS — I10 Essential (primary) hypertension: Secondary | ICD-10-CM | POA: Diagnosis not present

## 2023-12-18 LAB — GLUCOSE, CAPILLARY
Glucose-Capillary: 125 mg/dL — ABNORMAL HIGH (ref 70–99)
Glucose-Capillary: 271 mg/dL — ABNORMAL HIGH (ref 70–99)
Glucose-Capillary: 252 mg/dL — ABNORMAL HIGH (ref 70–99)
Glucose-Capillary: 293 mg/dL — ABNORMAL HIGH (ref 70–99)

## 2023-12-18 MED ORDER — INSULIN GLARGINE-YFGN 100 UNIT/ML ~~LOC~~ SOLN: 25.0000 [IU] | Freq: Every day | SUBCUTANEOUS | 11 refills | Status: AC

## 2023-12-18 MED ORDER — ADULT MULTIVITAMIN W/MINERALS CH: 1.0000 | ORAL_TABLET | Freq: Every day | ORAL | 1 refills | Status: AC

## 2023-12-18 MED ORDER — OXYCODONE-ACETAMINOPHEN 5-325 MG PO TABS: 2.0000 | ORAL_TABLET | ORAL | 0 refills | Status: AC | PRN

## 2023-12-18 MED ORDER — INSULIN ASPART 100 UNIT/ML IJ SOLN: 0.0000 [IU] | Freq: Every day | INTRAMUSCULAR | 11 refills | Status: AC

## 2023-12-18 MED ORDER — ALBUTEROL SULFATE (2.5 MG/3ML) 0.083% IN NEBU: 2.5000 mg | INHALATION_SOLUTION | RESPIRATORY_TRACT | 12 refills | Status: AC | PRN

## 2023-12-18 MED ORDER — INSULIN ASPART 100 UNIT/ML IJ SOLN
0.0000 [IU] | Freq: Three times a day (TID) | INTRAMUSCULAR | 11 refills | Status: AC
Start: 2023-12-18 — End: ?

## 2023-12-18 MED ORDER — ASPIRIN 81 MG PO TBEC: 81.0000 mg | DELAYED_RELEASE_TABLET | Freq: Every day | ORAL | 12 refills | Status: AC

## 2023-12-18 MED ORDER — SENNOSIDES-DOCUSATE SODIUM 8.6-50 MG PO TABS: 1.0000 | ORAL_TABLET | Freq: Every evening | ORAL | 1 refills | Status: AC | PRN

## 2023-12-18 MED ORDER — ATORVASTATIN CALCIUM 10 MG PO TABS: 10.0000 mg | ORAL_TABLET | Freq: Every morning | ORAL | 1 refills | Status: AC

## 2023-12-18 MED ORDER — CARVEDILOL 6.25 MG PO TABS: 6.2500 mg | ORAL_TABLET | Freq: Two times a day (BID) | ORAL | 1 refills | Status: AC

## 2023-12-18 NOTE — Discharge Instructions (Addendum)
 DISCHARGE INSTRUCTIONS LUMBAR LAMINECTOMY / DISCECTOMY   No dressing care is needed unless there is a noticed problems with the incision.  Please call for any drainage.  Patient may shower or bathe.   EXERCISE Lift objects weighing less than 10-15 lbs Do not bend or twist at the waist-always bend your knees!! Limit your sitting to 20-30 minute intervals.  You should lie down or walk in between sitting periods.  There are no limitations for sitting in a recliner chair. Walk as much as possible-let discomfort be your guide.  You may also go up and down stairs as much as you can tolerate.  Walking outside (as long as it is nice weather) or walking on a treadmill is permitted (no incline).    PAIN Take pain medication as prescribed. As your pain level decreases, you may begin to take over-the-counter Extra Strength Tylenol .  Non-steroidal anti-inflammatory medications (NSAIDs) such as Advil, Aleve or Motrin are also ok.    DRIVING  You may not drive a car until told otherwise by your physician (usually at your first office visit).   You may be a passenger for short distances (20-30 minutes).  If you must take a longer trip, make sure to make several pit stops so that you can walk around and stretch your legs.  Reclining the passenger seat seems to be the most comfortable position for most patients.        FOLLOW-UP APPOINTMENTS A follow up appointment should already have been scheduled.  If it wasn't or if you have any questions, please CALL [phone]   Cordella Rhein, MD, MS [facility] Specialist [phone]

## 2023-12-18 NOTE — TOC Initial Note (Signed)
 Transition of Care [facility]) - Initial/Assessment Note    Patient Details  Name: [name] MRN: [id number] Date of Birth: 1940-08-21  [facility] CM/SW Contact:    Lauraine FORBES Saa, LCSWA Phone Number: 12/18/2023, 9:28 AM  Clinical Narrative:                  9:28 AM Per chart review, therapy recommended patient to discharge to SNF. Per RNCM, patient was agreeable with SNF if recommended. CSW sent patient's FL2 to SNFs in [facility]. CSW will continue to follow.  Expected Discharge Plan: Skilled Nursing Facility Barriers to Discharge: Continued Medical Work up, English As A Second Language Teacher, SNF Pending bed offer   Patient Goals and CMS Choice Patient states their goals for this hospitalization and ongoing recovery are:: SNF          Expected Discharge Plan and Services In-house Referral: Clinical Social Work Discharge Planning Services: CM Consult Post Acute Care Choice: Skilled Nursing Facility Living arrangements for the past 2 months: Single Family Home                                      Prior Living Arrangements/Services Living arrangements for the past 2 months: Single Family Home Lives with:: Self Patient language and need for interpreter reviewed:: Yes        Need for Family Participation in Patient Care: No (Comment) Care giver support system in place?: No (comment) Current home services: DME, Home PT Criminal Activity/Legal Involvement Pertinent to Current Situation/Hospitalization: No - Comment as needed  Activities of Daily Living   ADL Screening (condition at time of admission) Independently performs ADLs?: Yes (appropriate for developmental age) Is the patient deaf or have difficulty hearing?: No Does the patient have difficulty seeing, even when wearing glasses/contacts?: No Does the patient have difficulty concentrating, remembering, or making decisions?: No  Permission Sought/Granted Permission sought to share information  with : Family Supports, Oceanographer granted to share information with : No (Contact information on chart)  Share Information with NAME: [name] Sauers  Permission granted to share info w AGENCY: SNF  Permission granted to share info w Relationship: Daughter  Permission granted to share info w Contact Information: [phone]  Emotional Assessment       Orientation: : Oriented to Self, Oriented to Place, Oriented to  Time, Oriented to Situation Alcohol / Substance Use: Not Applicable Psych Involvement: No (comment)  Admission diagnosis:  Spinal stenosis, lumbar [M48.061] Patient Active Problem List   Diagnosis Date Noted   Spinal stenosis, lumbar 12/16/2023   Spinal stenosis of lumbar region with neurogenic claudication 12/08/2023   Radiculopathy of leg 11/13/2023   Lower extremity edema 11/13/2023   Hyperkalemia 11/12/2023   Vertigo 10/02/2023   CKD (chronic kidney disease), stage IV (HCC) 10/02/2023   Pain due to onychomycosis of toenails of both feet 09/01/2018   Left shoulder pain    Limited joint range of motion (ROM)    Chest pain 10/23/2015   Type 2 diabetes mellitus with obesity 10/23/2015   Chest pain with high risk for cardiac etiology 07/28/2014   Insulin  dependent type 2 diabetes mellitus (HCC) 12/27/2010   Hypertension associated with diabetes (HCC) 12/27/2010   Hyperlipidemia associated with type 2 diabetes mellitus (HCC) 12/27/2010   GERD (gastroesophageal reflux disease) 12/27/2010   Renal cell adenocarcinoma (HCC) 12/27/2010   Arthritis 12/27/2010   Cervical stenosis of spine 12/27/2010  Carpal tunnel syndrome 12/27/2010   Asthma 12/27/2010   PCP:  Leigh Lung, MD Pharmacy:   [id number] [address] [place], [place] - [address] [place] [address] Phone: [phone] Fax: [phone]  [facility] - [facility] [address] [place] [address] Phone: [phone] Fax:  [phone]     Social Drivers of Health (SDOH) Social History: SDOH Screenings   Food Insecurity: No Food Insecurity (12/17/2023)  Housing: Low Risk  (12/17/2023)  Transportation Needs: No Transportation Needs (12/17/2023)  Utilities: Not At Risk (12/17/2023)  Social Connections: Moderately Integrated (12/17/2023)  Tobacco Use: Low Risk  (12/05/2023)   SDOH Interventions:     Readmission Risk Interventions     No data to display

## 2023-12-18 NOTE — Progress Notes (Signed)
 Mobility Specialist Progress Note:    12/18/23 0952  Mobility  Activity Ambulated with assistance (In hallway)  Level of Assistance Contact guard assist, steadying assist  Assistive Device Front wheel walker  Distance Ambulated (ft) 350 ft  Activity Response Tolerated well  Mobility Referral Yes  Mobility visit 1 Mobility  Mobility Specialist Start Time (ACUTE ONLY) 0850  Mobility Specialist Stop Time (ACUTE ONLY) 0912  Mobility Specialist Time Calculation (min) (ACUTE ONLY) 22 min   Received pt in bed and eager for mobility. Pt required MinG for safety. No c/o. Returned to room without fault. Left pt in chair with alarm on. Personal belongings and call light within reach. All needs met.  Lavanda Pollack Mobility Specialist  Please contact via [employer] or  Rehab Office [phone]

## 2023-12-18 NOTE — Progress Notes (Signed)
 Subjective:     Patient reports her pain is tolerable.  She still is pain in the right leg but this improves when she is lying down.  Objective: Vital signs in last 24 hours: Temp:  [97.8 F (36.6 C)-99.2 F (37.3 C)] 97.8 F (36.6 C) (10/30 0435) Pulse Rate:  [56-70] 56 (10/30 0435) Resp:  [16-18] 18 (10/30 0435) BP: (133-160)/(56-71) 156/66 (10/30 0435) SpO2:  [94 %-98 %] 97 % (10/30 0435)  Intake/Output from previous day: 10/29 0701 - 10/30 0700 In: 350 [P.O.:350] Out: 200 [Urine:200]  Labs: No results for input(s): WBC, RBC, HCT, PLT in the last 72 hours. No results for input(s): NA, K, CL, CO2, BUN, CREATININE, GLUCOSE, CALCIUM  in the last 72 hours. No results for input(s): LABPT, INR in the last 72 hours.  Physical Exam: Well-appearing woman in no acute distress There is no height or weight on file to calculate BMI.  Incision C/D/I  Sensation     Right      Left  L3   Intact     Intact L4   Intact     Intact L5    Intact     Intact S1   Intact     Intact    Motor Exam     Right     Left Iliopsoas  5/5     5/5  Quad   5/5     5/5  Hamstring  5/5     5/5  Tibials Anterior 5/5     5/5  EHL   5/5     5/5  Gastrocs  5/5     5/5      Assessment/Plan: Patient was able to mobilize with therapy but still is making slow progress.  Again she is not able to care for herself at home as she is no longer has home support.  I will try to make arrangements for rehab placement hopefully today or tomorrow Cordella Rhein, MD, MS [facility] [phone]

## 2023-12-18 NOTE — [place] FL2 (Signed)
 [place]  MEDICAID FL2 LEVEL OF CARE FORM     IDENTIFICATION  Patient Name: [name] Birthdate: 1940/05/07 Sex: female Admission Date (Current Location): 12/16/2023  [facility] and [place] Number:  Producer, Television/film/video and Address:  [facility] [place] [address], [place], [place] [address]      Provider Number: [id number]  Attending Physician Name and Address:  Reyne Cordella SQUIBB, MD  Relative Name and Phone Number:  [name] Sauers; Daughter; [phone]    Current Level of Care: Hospital Recommended Level of Care: Skilled Nursing Facility Prior Approval Number:    Date Approved/Denied:   PASRR Number: [id number]  Discharge Plan: SNF    Current Diagnoses: Patient Active Problem List   Diagnosis Date Noted   Spinal stenosis, lumbar 12/16/2023   Spinal stenosis of lumbar region with neurogenic claudication 12/08/2023   Radiculopathy of leg 11/13/2023   Lower extremity edema 11/13/2023   Hyperkalemia 11/12/2023   Vertigo 10/02/2023   CKD (chronic kidney disease), stage IV (HCC) 10/02/2023   Pain due to onychomycosis of toenails of both feet 09/01/2018   Left shoulder pain    Limited joint range of motion (ROM)    Chest pain 10/23/2015   Type 2 diabetes mellitus with obesity 10/23/2015   Chest pain with high risk for cardiac etiology 07/28/2014   Insulin  dependent type 2 diabetes mellitus (HCC) 12/27/2010   Hypertension associated with diabetes (HCC) 12/27/2010   Hyperlipidemia associated with type 2 diabetes mellitus (HCC) 12/27/2010   GERD (gastroesophageal reflux disease) 12/27/2010   Renal cell adenocarcinoma (HCC) 12/27/2010   Arthritis 12/27/2010   Cervical stenosis of spine 12/27/2010   Carpal tunnel syndrome 12/27/2010   Asthma 12/27/2010    Orientation RESPIRATION BLADDER Height & Weight     Self, Time, Situation, Place  Normal (Room Air) Continent Weight:   Height:     BEHAVIORAL SYMPTOMS/MOOD NEUROLOGICAL BOWEL NUTRITION  STATUS      Continent Diet (Please see discharge summary)  AMBULATORY STATUS COMMUNICATION OF NEEDS Skin   Limited Assist Verbally Surgical wounds (Wound 12/08/23 0856 Surgical Closed Surgical Incision Back)                       Personal Care Assistance Level of Assistance  Bathing, Feeding, Dressing Bathing Assistance: Limited assistance Feeding assistance: Limited assistance Dressing Assistance: Limited assistance     Functional Limitations Info             SPECIAL CARE FACTORS FREQUENCY  PT (By licensed PT), OT (By licensed OT)     PT Frequency: 5x OT Frequency: 5x            Contractures Contractures Info: Not present    Additional Factors Info  Code Status, Allergies, Insulin  Sliding Scale Code Status Info: Full Code Allergies Info: Sulfa Antibiotics   Insulin  Sliding Scale Info: Please see discharge summary       Current Medications (12/18/2023):  This is the current hospital active medication list Current Facility-Administered Medications  Medication Dose Route Frequency Provider Last Rate Last Admin   albuterol  (PROVENTIL ) (2.5 MG/3ML) 0.083% nebulizer solution 2.5 mg  2.5 mg Inhalation Q4H PRN Reyne Cordella SQUIBB, MD       amLODipine  (NORVASC ) tablet 10 mg  10 mg Oral Daily Reyne Cordella SQUIBB, MD   10 mg at 12/17/23 0931   aspirin  EC tablet 81 mg  81 mg Oral QHS Reyne Cordella SQUIBB, MD   81 mg at 12/17/23 2105  atorvastatin  (LIPITOR) tablet 10 mg  10 mg Oral q morning Gebauer, Gregory P, MD   10 mg at 12/17/23 0930   carvedilol  (COREG ) tablet 6.25 mg  6.25 mg Oral BID WC Reyne Cordella SQUIBB, MD   6.25 mg at 12/17/23 1702   docusate sodium  (COLACE) capsule 100 mg  100 mg Oral BID Gebauer, Gregory P, MD   100 mg at 12/17/23 2105   donepezil  (ARICEPT ) tablet 10 mg  10 mg Oral Daily Gebauer, Gregory P, MD   10 mg at 12/17/23 9070   furosemide  (LASIX ) tablet 20 mg  20 mg Oral Daily Gebauer, Gregory P, MD   20 mg at 12/17/23 9070   gabapentin  (NEURONTIN )  capsule 300 mg  300 mg Oral QHS Reyne Cordella SQUIBB, MD   300 mg at 12/17/23 2105   hydrALAZINE  (APRESOLINE ) tablet 50 mg  50 mg Oral BID Reyne Cordella SQUIBB, MD   50 mg at 12/17/23 2105   insulin  aspart (novoLOG ) injection 0-15 Units  0-15 Units Subcutaneous TID WC Reyne Cordella SQUIBB, MD   8 Units at 12/17/23 1702   insulin  aspart (novoLOG ) injection 0-5 Units  0-5 Units Subcutaneous QHS Reyne Cordella SQUIBB, MD   2 Units at 12/17/23 2109   insulin  glargine-yfgn (SEMGLEE ) injection 25 Units  25 Units Subcutaneous Daily Reyne Cordella SQUIBB, MD   25 Units at 12/17/23 9071   multivitamin with minerals tablet 1 tablet  1 tablet Oral Daily Gebauer, Gregory P, MD   1 tablet at 12/17/23 0930   oxyCODONE -acetaminophen  (PERCOCET/ROXICET) 5-325 MG per tablet 1 tablet  1 tablet Oral Q4H PRN Gebauer, Gregory P, MD   1 tablet at 12/17/23 1701   oxyCODONE -acetaminophen  (PERCOCET/ROXICET) 5-325 MG per tablet 2 tablet  2 tablet Oral Q4H PRN Reyne Cordella SQUIBB, MD   2 tablet at 12/17/23 2105   pregabalin (LYRICA) capsule 75 mg  75 mg Oral BID Gebauer, Gregory P, MD   75 mg at 12/17/23 2105   senna-docusate (Senokot-S) tablet 1 tablet  1 tablet Oral QHS PRN Reyne Cordella SQUIBB, MD         Discharge Medications: Please see discharge summary for a list of discharge medications.  Relevant Imaging Results:  Relevant Lab Results:   Additional Information SSN [id number]  Lauraine FORBES Saa, LCSWA

## 2023-12-18 NOTE — Plan of Care (Signed)
  Problem: Education: Goal: Knowledge of General Education information will improve Description: Including pain rating scale, medication(s)/side effects and non-pharmacologic comfort measures Outcome: Progressing   Problem: Health Behavior/Discharge Planning: Goal: Ability to manage health-related needs will improve Outcome: Not Progressing   Problem: Clinical Measurements: Goal: Ability to maintain clinical measurements within normal limits will improve Outcome: Not Progressing   Problem: Activity: Goal: Risk for activity intolerance will decrease Outcome: Not Progressing   Problem: Pain Managment: Goal: General experience of comfort will improve and/or be controlled Outcome: Not Progressing

## 2023-12-18 NOTE — Discharge Summary (Signed)
 Physician Discharge Summary  Patient ID: MICHON KACZMAREK MRN: [id number] DOB/AGE: March 23, 1940 83 y.o.  Admit date: 12/16/2023 Discharge date: 12/18/2023  Admission Diagnoses:  Discharge Diagnoses:  Principal Problem:   Spinal stenosis, lumbar   Discharged Condition: fair  Hospital Course: The patient is a very pleasant 83 year old woman who previously undergone a lumbar laminectomy.  She was initially and we discharged to home, but had a great deal difficulty with her activities of daily living and mobilization.  She did family support, however to return to her own homes and were no longer able to help her.  Therefore she was admitted to the hospital.  She was seen by physical and Occupational Therapy.  She was maintained in the hospital until rehab arrangements could be made.  She was transferred to a rehab facility on 12/22/23.  Consults: None  Significant Diagnostic Studies:   Treatments:   Discharge Exam: Blood pressure (!) 156/66, pulse (!) 56, temperature 97.8 F (36.6 C), temperature source Oral, resp. rate 18, SpO2 97%. Well-appearing woman no acute distress.  5 out of 5 strength in bilateral extremities.  Intact station in the saphenous, sural, tibial, peroneal nerve distributions  Disposition:  To rehab   Signed: [name] 12/18/2023, 7:37 AM

## 2023-12-19 DIAGNOSIS — E109 Type 1 diabetes mellitus without complications: Secondary | ICD-10-CM | POA: Diagnosis not present

## 2023-12-19 DIAGNOSIS — Z79899 Other long term (current) drug therapy: Secondary | ICD-10-CM | POA: Diagnosis not present

## 2023-12-19 DIAGNOSIS — M4800 Spinal stenosis, site unspecified: Secondary | ICD-10-CM | POA: Diagnosis not present

## 2023-12-19 DIAGNOSIS — Z7982 Long term (current) use of aspirin: Secondary | ICD-10-CM | POA: Diagnosis not present

## 2023-12-19 DIAGNOSIS — J45909 Unspecified asthma, uncomplicated: Secondary | ICD-10-CM | POA: Diagnosis not present

## 2023-12-19 DIAGNOSIS — I10 Essential (primary) hypertension: Secondary | ICD-10-CM | POA: Diagnosis not present

## 2023-12-19 LAB — GLUCOSE, CAPILLARY
Glucose-Capillary: 157 mg/dL — ABNORMAL HIGH (ref 70–99)
Glucose-Capillary: 209 mg/dL — ABNORMAL HIGH (ref 70–99)
Glucose-Capillary: 238 mg/dL — ABNORMAL HIGH (ref 70–99)
Glucose-Capillary: 138 mg/dL — ABNORMAL HIGH (ref 70–99)

## 2023-12-19 MED ORDER — POLYETHYLENE GLYCOL 3350 17 G PO PACK
17.0000 g | PACK | Freq: Every day | ORAL | Status: DC | PRN
  Administered 2023-12-19: 17 g via ORAL
  Filled 2023-12-19: qty 1

## 2023-12-19 NOTE — TOC Progression Note (Signed)
 Transition of Care [facility]) - Progression Note    Patient Details  Name: [name] MRN: [id number] Date of Birth: 1940-11-27  Transition of Care [facility]) CM/SW Contact  Lauraine FORBES Saa, LCSWA Phone Number: 12/19/2023, 4:04 PM  Clinical Narrative:     4:04 PM CSW introduced self and role to patient. CSW discussed SNF with patient. Patient confirmed interest in SNF. CSW provided patient with current SNF options and their quarterly Medicare ratings ([facility], [address], [address], [place], [place]). Patient deferred SNF decision to daughter, [name]. CSW spoke with [name] who informed CSW of SNF decision [facility]). CSW informed SNF of bed acceptance. CSW submitted SNF insurance authorization which is currently pending (ID [id number]). CSW will continue to follow.  Expected Discharge Plan: Skilled Nursing Facility Barriers to Discharge: Continued Medical Work up, English As A Second Language Teacher, SNF Pending bed offer               Expected Discharge Plan and Services In-house Referral: Clinical Social Work Discharge Planning Services: [employer] Consult Post Acute Care Choice: Skilled Nursing Facility Living arrangements for the past 2 months: Single Family Home                                       Social Drivers of Health (SDOH) Interventions SDOH Screenings   Food Insecurity: No Food Insecurity (12/17/2023)  Housing: Low Risk  (12/17/2023)  Transportation Needs: No Transportation Needs (12/17/2023)  Utilities: Not At Risk (12/17/2023)  Social Connections: Moderately Integrated (12/17/2023)  Tobacco Use: Low Risk  (12/05/2023)    Readmission Risk Interventions     No data to display

## 2023-12-19 NOTE — Plan of Care (Signed)

## 2023-12-19 NOTE — Progress Notes (Signed)
 Physical Therapy Treatment Patient Details Name: [name] MRN: [id number] DOB: 1940/05/09 Today's Date: 12/19/2023   History of Present Illness Pt is an 83 y/o female admitted for L4-5 laminectomy on 10/20 in setting of neurogenic claudication and severe spinal stenosis at L4-5 levels. She then was dc home and was seen in OP setting and was recommended to come back to the hospital due increase in pain and not able to complete ADLS. PMH: DM1, GERD, HTN, sleep apnea, vertigo    PT Comments  Pt tolerates treatment well, ambulating for increased distances and with improved stability. Pt continues to demonstrate deficits in dynamic standing balance, although she corrects these instances of instability with UE support of RW or counter. Pt reports a history of chronic dizziness (for multiple years) if lying flat, so she does not lie flat at baseline. Pt reports transient dizziness with supine to sit today but does not report further dizziness when up and mobilizing. Vestibular PT may be worth pursuing in the outpatient setting down the road to address this chronic issue. Pt lacks caregiver support at home and remains at an increased risk for falls with ADLs. Patient will benefit from continued inpatient follow up therapy, <3 hours/day.    If plan is discharge home, recommend the following: A little help with walking and/or transfers;A little help with bathing/dressing/bathroom;Assistance with cooking/housework;Assist for transportation;Help with stairs or ramp for entrance   Can travel by private vehicle     Yes  Equipment Recommendations  BSC/3in1    Recommendations for Other Services       Precautions / Restrictions Precautions Precautions: Fall;Back Precaution Booklet Issued: Yes (comment) Recall of Precautions/Restrictions: Impaired Precaution/Restrictions Comments: pt recalls no twisting, also reports she needs to keep her back straight Restrictions Weight Bearing Restrictions Per  Provider Order: No     Mobility  Bed Mobility Overal bed mobility: Needs Assistance Bed Mobility: Rolling, Sidelying to Sit Rolling: Contact guard assist, Used rails Sidelying to sit: Contact guard assist            Transfers Overall transfer level: Needs assistance Equipment used: Rolling walker (2 wheels) Transfers: Sit to/from Stand Sit to Stand: Contact guard assist                Ambulation/Gait Ambulation/Gait assistance: Contact guard assist Gait Distance (Feet): 150 Feet Assistive device: Rolling walker (2 wheels) Gait Pattern/deviations: Step-through pattern Gait velocity: reduced Gait velocity interpretation: <1.8 ft/sec, indicate of risk for recurrent falls   General Gait Details: slowed step-through gait, no significant instability with turns   Optometrist     Tilt Bed    Modified Rankin (Stroke Patients Only)       Balance Overall balance assessment: Needs assistance Sitting-balance support: No upper extremity supported, Feet supported Sitting balance-Leahy Scale: Good     Standing balance support: Single extremity supported, Reliant on assistive device for balance Standing balance-Leahy Scale: Poor Standing balance comment: multiple mild posterior losses of balance when without UE support                            Communication Communication Communication: No apparent difficulties  Cognition Arousal: Alert Behavior During Therapy: WFL for tasks assessed/performed   PT - Cognitive impairments: No apparent impairments  Following commands: Intact      Cueing Cueing Techniques: Verbal cues  Exercises      General Comments General comments (skin integrity, edema, etc.): VSS on RA, pt reports a history of dizziness if she lies flat, also reports very transient episode of dizziness with supine to sit but this subsides within seconds of being upright. Pt  does not lie flat at home and has not done so for years due to her history of dizziness. Further outpatient PT may be warranted for vestibular evaluation if the pt elects to pursue it after rehab placement      Pertinent Vitals/Pain Pain Assessment Pain Assessment: Faces Faces Pain Scale: Hurts little more Pain Location: back and thighs Pain Descriptors / Indicators: Sore Pain Intervention(s): Monitored during session    Home Living                          Prior Function            PT Goals (current goals can now be found in the care plan section) Acute Rehab PT Goals Patient Stated Goal: Independent and safe at home Progress towards PT goals: Progressing toward goals    Frequency    Min 2X/week      PT Plan      Co-evaluation              AM-PAC PT 6 Clicks Mobility   Outcome Measure  Help needed turning from your back to your side while in a flat bed without using bedrails?: A Little Help needed moving from lying on your back to sitting on the side of a flat bed without using bedrails?: A Little Help needed moving to and from a bed to a chair (including a wheelchair)?: A Little Help needed standing up from a chair using your arms (e.g., wheelchair or bedside chair)?: A Little Help needed to walk in hospital room?: A Little Help needed climbing 3-5 steps with a railing? : A Lot 6 Click Score: 17    End of Session Equipment Utilized During Treatment: Gait belt Activity Tolerance: Patient tolerated treatment well Patient left: in chair;with call bell/phone within reach;with chair alarm set Nurse Communication: Mobility status PT Visit Diagnosis: Unsteadiness on feet (R26.81);Pain Pain - Right/Left: Right Pain - part of body: Leg     Time: 0832-0901 PT Time Calculation (min) (ACUTE ONLY): 29 min  Charges:    $Gait Training: 8-22 mins $Therapeutic Activity: 8-22 mins PT General Charges $$ ACUTE PT VISIT: 1 Visit                      Bernardino JINNY Ruth, PT, DPT Acute Rehabilitation Office [phone]    Bernardino JINNY Ruth 12/19/2023, 9:09 AM

## 2023-12-19 NOTE — Progress Notes (Signed)
 Occupational Therapy Treatment Patient Details Name: [name] MRN: [id number] DOB: 1940-05-25 Today's Date: 12/19/2023   History of present illness Pt is an 83 y/o female admitted for L4-5 laminectomy on 10/20 in setting of neurogenic claudication and severe spinal stenosis at L4-5 levels. She then was dc home and was seen in OP setting and was recommended to come back to the hospital due increase in pain and not able to complete ADLS. PMH: DM1, GERD, HTN, sleep apnea, vertigo   OT comments  Pt. Seen for skilled OT treatment session. Pt. Able to sit/stand from recliner with CGA. Ambulation to/from b.room with no LOB noted.  Introduction of A/E for LB ADL completion while maintaining back precautions.  Pt. Verbalized understanding but requests further practice and education of sock aide.  Agree with d/c recommendations.  Next session cont. With A/E training for LB ADLs.       If plan is discharge home, recommend the following:  A little help with walking and/or transfers;A little help with bathing/dressing/bathroom;Assistance with cooking/housework;Assist for transportation;Help with stairs or ramp for entrance;Supervision due to cognitive status   Equipment Recommendations       Recommendations for Other Services      Precautions / Restrictions Precautions Precautions: Fall;Back Precaution/Restrictions Comments: verbalized not being able to bend forward       Mobility Bed Mobility               General bed mobility comments: seated in recliner beg./end of session    Transfers Overall transfer level: Needs assistance Equipment used: Rolling walker (2 wheels) Transfers: Sit to/from Stand, Bed to chair/wheelchair/BSC Sit to Stand: Contact guard assist           General transfer comment: cues for hand placement and safety with backing all the way up to surface before attempting to sit down     Balance                                            ADL either performed or assessed with clinical judgement   ADL Overall ADL's : Needs assistance/impaired                     Lower Body Dressing: Sitting/lateral leans;Set up;Maximal assistance;Cueing for sequencing;Cueing for back precautions;With adaptive equipment Lower Body Dressing Details (indicate cue type and reason): able to cross LLE for figure 4 but not RLE, demonstrated use of reacher and sock aide pt. requests the need for more practice (reacher and sock aide in pts. closet) Toilet Transfer: Contact guard assist;Rolling walker (2 wheels)   Toileting- Clothing Manipulation and Hygiene: Contact guard assist;Sit to/from stand              Extremity/Trunk Assessment              Vision       Perception     Praxis     Communication Communication Communication: No apparent difficulties   Cognition Arousal: Alert Behavior During Therapy: WFL for tasks assessed/performed                                 Following commands: Intact        Cueing   Cueing Techniques: Verbal cues  Exercises      Shoulder Instructions  General Comments      Pertinent Vitals/ Pain       Pain Assessment Pain Assessment: No/denies pain  Home Living                                          Prior Functioning/Environment              Frequency  Min 2X/week        Progress Toward Goals  OT Goals(current goals can now be found in the care plan section)  Progress towards OT goals: Progressing toward goals     Plan      Co-evaluation                 AM-PAC OT 6 Clicks Daily Activity     Outcome Measure   Help from another person eating meals?: None Help from another person taking care of personal grooming?: None Help from another person toileting, which includes using toliet, bedpan, or urinal?: A Little Help from another person bathing (including washing, rinsing, drying)?: A Little Help from  another person to put on and taking off regular upper body clothing?: None Help from another person to put on and taking off regular lower body clothing?: A Little 6 Click Score: 21    End of Session Equipment Utilized During Treatment: Rolling walker (2 wheels)  OT Visit Diagnosis: Pain   Activity Tolerance Patient tolerated treatment well   Patient Left in chair;with call bell/phone within reach;with chair alarm set   Nurse Communication          Time: 8676-8663 OT Time Calculation (min): 13 min  Charges: OT General Charges $OT Visit: 1 Visit OT Treatments $Self Care/Home Management : 8-22 mins  Randall, COTA/L Acute Rehabilitation [phone]   CHRISTELLA Nest Lorraine-COTA/L  12/19/2023, 2:04 PM

## 2023-12-19 NOTE — Progress Notes (Signed)
 Subjective:     Patient complains of pain in the outer part of the right leg.  This goes from the hip down the thigh to the knee.  Does not really extend to the calf or foot.  Objective: Vital signs in last 24 hours: Temp:  [98 F (36.7 C)-98.5 F (36.9 C)] 98 F (36.7 C) (10/31 0806) Pulse Rate:  [58-70] 58 (10/31 1000) Resp:  [16-17] 16 (10/31 0806) BP: (139-175)/(57-75) 139/57 (10/31 0806) SpO2:  [97 %-99 %] 99 % (10/31 0806)  Intake/Output from previous day: 10/30 0701 - 10/31 0700 In: -  Out: 1100 [Urine:1100]  Labs: No results for input(s): WBC, RBC, HCT, PLT in the last 72 hours. No results for input(s): NA, K, CL, CO2, BUN, CREATININE, GLUCOSE, CALCIUM  in the last 72 hours. No results for input(s): LABPT, INR in the last 72 hours.  Physical Exam: Neurologically intact There is no height or weight on file to calculate BMI.  Tender palpation over the right greater trochanter  Incision C/D/I  Sensation     Right      Left  L3   Intact     Intact L4   Intact     Intact L5    Intact     Intact S1   Intact     Intact    Motor Exam     Right     Left Iliopsoas  5/5     5/5  Quad   5/5     5/5  Hamstring  5/5     5/5  Tibials Anterior 5/5     5/5  EHL   5/5     5/5  Gastrocs  5/5     5/5      Assessment/Plan: The patient is working with therapy.  We are still awaiting rehab placement.  She does have some degree of right leg pain.  This seems to be more likely trochanteric bursitis though could be certainly radicular given her recent surgery.  We discussed again using steroids but she is diabetic we discussed the possibly of Toradol  but she does have poor kidney function on his right kidney.  This plan I think the best option be to continue with physical therapy.  Will continue the current pain regimen.  Hopeful to discharge her to a rehab facility soon as arrangements were made Cordella Rhein, MD, MS [facility]  Specialist [phone]

## 2023-12-20 DIAGNOSIS — Z7982 Long term (current) use of aspirin: Secondary | ICD-10-CM | POA: Diagnosis not present

## 2023-12-20 DIAGNOSIS — E109 Type 1 diabetes mellitus without complications: Secondary | ICD-10-CM | POA: Diagnosis not present

## 2023-12-20 DIAGNOSIS — J45909 Unspecified asthma, uncomplicated: Secondary | ICD-10-CM | POA: Diagnosis not present

## 2023-12-20 DIAGNOSIS — I10 Essential (primary) hypertension: Secondary | ICD-10-CM | POA: Diagnosis not present

## 2023-12-20 DIAGNOSIS — Z79899 Other long term (current) drug therapy: Secondary | ICD-10-CM | POA: Diagnosis not present

## 2023-12-20 DIAGNOSIS — M4800 Spinal stenosis, site unspecified: Secondary | ICD-10-CM | POA: Diagnosis not present

## 2023-12-20 LAB — GLUCOSE, CAPILLARY
Glucose-Capillary: 103 mg/dL — ABNORMAL HIGH (ref 70–99)
Glucose-Capillary: 306 mg/dL — ABNORMAL HIGH (ref 70–99)
Glucose-Capillary: 154 mg/dL — ABNORMAL HIGH (ref 70–99)
Glucose-Capillary: 296 mg/dL — ABNORMAL HIGH (ref 70–99)

## 2023-12-20 NOTE — TOC Progression Note (Signed)
 Transition of Care [facility]) - Progression Note    Patient Details  Name: ILY DENNO MRN: [id number] Date of Birth: 1940-06-23  Transition of Care [facility]) CM/SW Contact  Gwenn Frieze [place], [place] Phone Number: 12/20/2023, 1:01 PM  Clinical Narrative: Home and Community/[employer] auth for [facility] remains pending at this time. No additional clinicals being requested.   Frieze Gwenn, MSW, LCSW [phone] (coverage)        Expected Discharge Plan: Skilled Nursing Facility Barriers to Discharge: Continued Medical Work up, English As A Second Language Teacher, SNF Pending bed offer               Expected Discharge Plan and Services In-house Referral: Clinical Social Work Discharge Planning Services: [employer] Consult Post Acute Care Choice: Skilled Nursing Facility Living arrangements for the past 2 months: Single Family Home                                       Social Drivers of Health (SDOH) Interventions SDOH Screenings   Food Insecurity: No Food Insecurity (12/17/2023)  Housing: Low Risk  (12/17/2023)  Transportation Needs: No Transportation Needs (12/17/2023)  Utilities: Not At Risk (12/17/2023)  Social Connections: Moderately Integrated (12/17/2023)  Tobacco Use: Low Risk  (12/05/2023)    Readmission Risk Interventions     No data to display

## 2023-12-20 NOTE — Plan of Care (Signed)
  Problem: Education: Goal: Knowledge of General Education information will improve Description: Including pain rating scale, medication(s)/side effects and non-pharmacologic comfort measures Outcome: Progressing   Problem: Health Behavior/Discharge Planning: Goal: Ability to manage health-related needs will improve Outcome: Progressing   Problem: Clinical Measurements: Goal: Ability to maintain clinical measurements within normal limits will improve Outcome: Progressing Goal: Will remain free from infection Outcome: Progressing Goal: Diagnostic test results will improve Outcome: Progressing Goal: Respiratory complications will improve Outcome: Progressing Goal: Cardiovascular complication will be avoided Outcome: Progressing   Problem: Activity: Goal: Risk for activity intolerance will decrease Outcome: Progressing   Problem: Nutrition: Goal: Adequate nutrition will be maintained Outcome: Progressing   Problem: Coping: Goal: Level of anxiety will decrease Outcome: Progressing   Problem: Elimination: Goal: Will not experience complications related to bowel motility Outcome: Progressing Goal: Will not experience complications related to urinary retention Outcome: Progressing   Problem: Pain Managment: Goal: General experience of comfort will improve and/or be controlled Outcome: Progressing   Problem: Safety: Goal: Ability to remain free from injury will improve Outcome: Progressing   Problem: Skin Integrity: Goal: Risk for impaired skin integrity will decrease Outcome: Progressing   Problem: Education: Goal: Ability to describe self-care measures that may prevent or decrease complications (Diabetes Survival Skills Education) will improve Outcome: Progressing Goal: Individualized Educational Video(s) Outcome: Progressing   Problem: Coping: Goal: Ability to adjust to condition or change in health will improve Outcome: Progressing   Problem: Fluid  Volume: Goal: Ability to maintain a balanced intake and output will improve Outcome: Progressing   Problem: Health Behavior/Discharge Planning: Goal: Ability to identify and utilize available resources and services will improve Outcome: Progressing Goal: Ability to manage health-related needs will improve Outcome: Progressing   Problem: Metabolic: Goal: Ability to maintain appropriate glucose levels will improve Outcome: Progressing   Problem: Nutritional: Goal: Maintenance of adequate nutrition will improve Outcome: Progressing Goal: Progress toward achieving an optimal weight will improve Outcome: Progressing   Problem: Skin Integrity: Goal: Risk for impaired skin integrity will decrease Outcome: Progressing   Problem: Skin Integrity: Goal: Risk for impaired skin integrity will decrease Outcome: Progressing   Problem: Tissue Perfusion: Goal: Adequacy of tissue perfusion will improve Outcome: Progressing

## 2023-12-20 NOTE — Progress Notes (Signed)
 Occupational Therapy Treatment Patient Details Name: [name] MRN: [id number] DOB: 1941-01-05 Today's Date: 12/20/2023   History of present illness Pt is an 83 y/o female admitted for L4-5 laminectomy on 10/20 in setting of neurogenic claudication and severe spinal stenosis at L4-5 levels. She then was dc home and was seen in OP setting and was recommended to come back to the hospital due increase in pain and not able to complete ADLS. PMH: DM1, GERD, HTN, sleep apnea, vertigo   OT comments  Pt. Seen for skilled OT treatment session.  Pt. Not recalling therapist asst. Or any details from our session/interactions yesterday.  Several cues and reminders and pt. Stated she remembered me.  Spoke with rn who states family was present earlier this am and confirmed pt. Baseline with mild confusion/forgetfulness.  Pt. Able to complete bed mobility with HOB heavily elevated with CGA.  Session focused on cont. From yesterday with use of A/E for LB ADLs.  Pt. Able to use reacher and sock aide with MINA for LB dressing for safe access to BLEs while maintaining back precautions of no bending.  Agree with current d/c recommendations for <3hrs/day therapy prior to home and S once in home setting secondary to noted cognitive status.  Cont. With acute OT POC.        If plan is discharge home, recommend the following:  A little help with walking and/or transfers;A little help with bathing/dressing/bathroom;Assistance with cooking/housework;Assist for transportation;Help with stairs or ramp for entrance;Supervision due to cognitive status   Equipment Recommendations       Recommendations for Other Services      Precautions / Restrictions Precautions Precautions: Fall;Back       Mobility Bed Mobility Overal bed mobility: Needs Assistance Bed Mobility: Supine to Sit     Supine to sit: Contact guard     General bed mobility comments: hob heavily elevated, pt. able to remove blankets and bring legs  to the side for sitting eob exiting on L side scoot towards eob    Transfers Overall transfer level: Needs assistance Equipment used: Rolling walker (2 wheels) Transfers: Sit to/from Stand, Bed to chair/wheelchair/BSC Sit to Stand: Contact guard assist     Step pivot transfers: Contact guard assist     General transfer comment: cues for hand placement and safety with backing all the way up to surface before attempting to sit down     Balance                                           ADL either performed or assessed with clinical judgement   ADL Overall ADL's : Needs assistance/impaired                     Lower Body Dressing: Minimal assistance;Sitting/lateral leans;Cueing for sequencing;Cueing for compensatory techniques;Adhering to back precautions;With adaptive equipment Lower Body Dressing Details (indicate cue type and reason): pt. able to return demo of use of A/E for LB dressing of socks with reacher and sock aide                    Extremity/Trunk Assessment              Vision       Perception     Praxis     Communication Communication Communication: No apparent difficulties   Cognition Arousal: Alert  Behavior During Therapy: WFL for tasks assessed/performed Cognition: No family/caregiver present to determine baseline             OT - Cognition Comments: rn states that family confirmed pt. at baseline with notable mild cog. deficits. some forgetfulness, short term memory                 Following commands: Intact        Cueing   Cueing Techniques: Verbal cues, Gestural cues  Exercises      Shoulder Instructions       General Comments      Pertinent Vitals/ Pain       Pain Assessment Pain Assessment: Faces Faces Pain Scale: Hurts a little bit Pain Location: R thigh/leg Pain Descriptors / Indicators: Sore, Aching Pain Intervention(s): Limited activity within patient's tolerance, Monitored  during session  Home Living                                          Prior Functioning/Environment              Frequency  Min 2X/week        Progress Toward Goals  OT Goals(current goals can now be found in the care plan section)  Progress towards OT goals: Progressing toward goals     Plan      Co-evaluation                 AM-PAC OT 6 Clicks Daily Activity     Outcome Measure   Help from another person eating meals?: None Help from another person taking care of personal grooming?: None Help from another person toileting, which includes using toliet, bedpan, or urinal?: A Little Help from another person bathing (including washing, rinsing, drying)?: A Little Help from another person to put on and taking off regular upper body clothing?: None Help from another person to put on and taking off regular lower body clothing?: A Little 6 Click Score: 21    End of Session Equipment Utilized During Treatment: Rolling walker (2 wheels)  OT Visit Diagnosis: Pain   Activity Tolerance Patient tolerated treatment well   Patient Left in chair;with call bell/phone within reach;with chair alarm set   Nurse Communication Other (comment) (rn confirmed mild confusion per family report)        Time: 9043-8987 OT Time Calculation (min): 16 min  Charges: OT General Charges $OT Visit: 1 Visit OT Treatments $Self Care/Home Management : 8-22 mins  [place], COTA/L Acute Rehabilitation [phone]   CHRISTELLA Nest Lorraine-COTA/L  12/20/2023, 1:38 PM

## 2023-12-20 NOTE — Progress Notes (Signed)
 Subjective:     Patient complains of pain in the outer part of the right leg.  This goes from the hip down the thigh to the knee.  Does not really extend to the calf or foot.   Objective: Vital signs in last 24 hours: stable  Intake/Output from previous day:    Labs: Recent Labs (last 2 labs)  No results for input(s): WBC, RBC, HCT, PLT in the last 72 hours.   Recent Labs (last 2 labs)  No results for input(s): NA, K, CL, CO2, BUN, CREATININE, GLUCOSE, CALCIUM  in the last 72 hours.   Recent Labs (last 2 labs)  No results for input(s): LABPT, INR in the last 72 hours.     Physical Exam: Neurologically intact There is no height or weight on file to calculate BMI.   Tender palpation over the right greater trochanter   Incision C/D/I   Sensation                                       Right                                                    Left   L3                                Intact                                                   Intact L4                                Intact                                                   Intact L5                                Intact                                                   Intact S1                                Intact                                                   Intact       Motor Exam  Right                                                    Left Iliopsoas                      5/5                                                       5/5        Quad                           5/5                                                       5/5        Hamstring                    5/5                                                       5/5        Tibials Anterior            5/5                                                       5/5        EHL                             5/5                                                       5/5        Gastrocs                       5/5                                                       5/5                                   Assessment/Plan: The patient is working with therapy.  We are still awaiting rehab placement.  She does have some degree of right leg pain.  This seems to be more likely trochanteric bursitis though could be certainly radicular given her recent surgery.  This is improve for her.. She will continue with physical therapy.  Will continue the current pain regimen.  Hopeful to discharge her to a rehab facility soon as arrangements were made Stefanie Rhein, MD, MS [facility] [phone]

## 2023-12-21 ENCOUNTER — Other Ambulatory Visit (HOSPITAL_COMMUNITY): Payer: Self-pay

## 2023-12-21 DIAGNOSIS — M4800 Spinal stenosis, site unspecified: Secondary | ICD-10-CM | POA: Diagnosis not present

## 2023-12-21 LAB — GLUCOSE, CAPILLARY
Glucose-Capillary: 121 mg/dL — ABNORMAL HIGH (ref 70–99)
Glucose-Capillary: 190 mg/dL — ABNORMAL HIGH (ref 70–99)
Glucose-Capillary: 258 mg/dL — ABNORMAL HIGH (ref 70–99)
Glucose-Capillary: 215 mg/dL — ABNORMAL HIGH (ref 70–99)

## 2023-12-21 NOTE — TOC Progression Note (Signed)
 Transition of Care [facility]) - Progression Note    Patient Details  Name: [name] MRN: [id number] Date of Birth: 27-Aug-1940  Transition of [facility]) CM/SW Contact  Gwenn Frieze [place], [place] Phone Number: 12/21/2023, 8:18 AM  Clinical Narrative: [facility]/[employer] auth remains pending at this time. Case has not been assigned to a reviewer yet.   Frieze Gwenn, MSW, LCSW [phone] (coverage)        Expected Discharge Plan: Skilled Nursing Facility Barriers to Discharge: Continued Medical Work up, English As A Second Language Teacher, SNF Pending bed offer               Expected Discharge Plan and Services In-house Referral: Clinical Social Work Discharge Planning Services: [employer] Consult Post Acute Care Choice: Skilled Nursing Facility Living arrangements for the past 2 months: Single Family Home                                       Social Drivers of Health (SDOH) Interventions SDOH Screenings   Food Insecurity: No Food Insecurity (12/17/2023)  Housing: Low Risk  (12/17/2023)  Transportation Needs: No Transportation Needs (12/17/2023)  Utilities: Not At Risk (12/17/2023)  Social Connections: Moderately Integrated (12/17/2023)  Tobacco Use: Low Risk  (12/05/2023)    Readmission Risk Interventions     No data to display

## 2023-12-21 NOTE — Plan of Care (Signed)

## 2023-12-21 NOTE — Progress Notes (Signed)
 Subjective:     Patient is resting comfortably.  Reports pain as a 1-2  Objective: Vital signs in last 24 hours: Temp:  [98.9 F (37.2 C)-99.7 F (37.6 C)] 98.9 F (37.2 C) (11/01 2100) Pulse Rate:  [67-70] 70 (11/01 2100) Resp:  [16] 16 (11/01 2100) BP: (148-153)/(53-54) 148/53 (11/01 2100) SpO2:  [97 %-98 %] 98 % (11/01 2100)  Intake/Output from previous day: No intake/output data recorded.  Labs: No results for input(s): WBC, RBC, HCT, PLT in the last 72 hours. No results for input(s): NA, K, CL, CO2, BUN, CREATININE, GLUCOSE, CALCIUM  in the last 72 hours. No results for input(s): LABPT, INR in the last 72 hours.  Physical Exam:  There is no height or weight on file to calculate BMI.  Incision C/D/I  Sensation     Right      Left  L3   Intact     Intact L4   Intact     Intact L5    Intact     Intact S1   Intact     Intact    Motor Exam     Right     Left Iliopsoas  5/5     5/5  Quad   5/5     5/5  Hamstring  5/5     5/5  Tibials Anterior 5/5     5/5  EHL   5/5     5/5  Gastrocs  5/5     5/5      Assessment/Plan: The patient is working with therapy.  We are still awaiting rehab placement.  She does have some degree of right leg pain.  This seems to be more likely trochanteric bursitis though could be certainly radicular given her recent surgery.  This is improve for her.. She will continue with physical therapy.  Will continue the current pain regimen.  Hopeful to discharge her to a rehab facility soon as arrangements were made   Cordella Rhein, MD, MS [facility] [phone]

## 2023-12-21 NOTE — Progress Notes (Addendum)
   12/21/23 0941  Mobility  Activity Ambulated with assistance  Level of Assistance Contact guard assist, steadying assist (MinA d/t R knee buckling x1)  Assistive Device Front wheel walker  Distance Ambulated (ft) 20 ft  Activity Response Tolerated fair  Mobility Referral Yes  Mobility visit 1 Mobility  Mobility Specialist Start Time (ACUTE ONLY) 0941  Mobility Specialist Stop Time (ACUTE ONLY) 0959  Mobility Specialist Time Calculation (min) (ACUTE ONLY) 18 min   Mobility Specialist: Progress Note  Pt agreeable to mobility session - received in bed. C/o R knee buckling. Returned to chair with all needs met - call bell within reach. Chair alarm on.   Additional comments: Pt opted to use BR, had BM, pericare completed ind. Pt washed her hands and brush her teeth at the sink.   Virgle Boards, BS Mobility Specialist Please contact via SecureChat or  Rehab office at [phone].

## 2023-12-21 NOTE — Plan of Care (Signed)

## 2023-12-22 ENCOUNTER — Other Ambulatory Visit (HOSPITAL_COMMUNITY): Payer: Self-pay

## 2023-12-22 DIAGNOSIS — Z79899 Other long term (current) drug therapy: Secondary | ICD-10-CM | POA: Diagnosis not present

## 2023-12-22 DIAGNOSIS — I10 Essential (primary) hypertension: Secondary | ICD-10-CM | POA: Diagnosis not present

## 2023-12-22 DIAGNOSIS — M4800 Spinal stenosis, site unspecified: Secondary | ICD-10-CM | POA: Diagnosis not present

## 2023-12-22 DIAGNOSIS — J45909 Unspecified asthma, uncomplicated: Secondary | ICD-10-CM | POA: Diagnosis not present

## 2023-12-22 DIAGNOSIS — Z7982 Long term (current) use of aspirin: Secondary | ICD-10-CM | POA: Diagnosis not present

## 2023-12-22 DIAGNOSIS — E109 Type 1 diabetes mellitus without complications: Secondary | ICD-10-CM | POA: Diagnosis not present

## 2023-12-22 LAB — GLUCOSE, CAPILLARY
Glucose-Capillary: 98 mg/dL (ref 70–99)
Glucose-Capillary: 178 mg/dL — ABNORMAL HIGH (ref 70–99)
Glucose-Capillary: 261 mg/dL — ABNORMAL HIGH (ref 70–99)
Glucose-Capillary: 333 mg/dL — ABNORMAL HIGH (ref 70–99)

## 2023-12-22 NOTE — Progress Notes (Signed)
 Occupational Therapy Treatment Patient Details Name: [name] MRN: [id number] DOB: Apr 14, 1940 Today's Date: 12/22/2023   History of present illness Pt is an 83 y/o female admitted for L4-5 laminectomy on 10/20 in setting of neurogenic claudication and severe spinal stenosis at L4-5 levels. She then was dc home and was seen in OP setting and was recommended to come back to the hospital due increase in pain and not able to complete ADLS. PMH: DM1, GERD, HTN, sleep apnea, vertigo   OT comments  Pt resting in chair comfortably. Pt ambulated with PT earlier, eager to perform more OOB activities. Pt states she has trouble getting around home and transporting items, trialed rollator during session. Pt with noticeable forgetfulness during session, increased cueing for safety and precautions. Pt able to ambulate to restroom with supervision for safety. Pt had difficulty locking brakes on rollator, able to with increased time. When trying to turn to sit on rollator Pt with decreased balance with only one hand supported, required HHA to turn around. Pt would benefit from RW over rollator for safety and added support. Pt instructed on use of sock aide, cueing for how to use, did not remember all steps from last time using sock aide. Pt able to complete LB dressing with min A, able to don briefs while sitting on toilet and maintaining back precautions. Pt would benefit from continued acute OT to maximize functional strength and independence. DC to postacute rehab <3hrs/day still recommended.       If plan is discharge home, recommend the following:  A little help with walking and/or transfers;A little help with bathing/dressing/bathroom;Assistance with cooking/housework;Assist for transportation;Help with stairs or ramp for entrance;Supervision due to cognitive status   Equipment Recommendations  BSC/3in1    Recommendations for Other Services      Precautions / Restrictions Precautions Precautions:  Fall;Back Precaution Booklet Issued: Yes (comment) Recall of Precautions/Restrictions: Impaired Precaution/Restrictions Comments: able to recall no bending only, pt re-educated on BLT Restrictions Weight Bearing Restrictions Per Provider Order: No       Mobility Bed Mobility               General bed mobility comments: in recliner    Transfers Overall transfer level: Needs assistance Equipment used: Rollator (4 wheels) Transfers: Sit to/from Stand Sit to Stand: Supervision           General transfer comment: supervision for safety with rollator, cueing for safety, would benefit from RW over rollator     Balance Overall balance assessment: Needs assistance Sitting-balance support: No upper extremity supported, Feet supported Sitting balance-Leahy Scale: Good     Standing balance support: Single extremity supported, During functional activity Standing balance-Leahy Scale: Poor Standing balance comment: reliant on external support                           ADL either performed or assessed with clinical judgement   ADL Overall ADL's : Needs assistance/impaired Eating/Feeding: Independent   Grooming: Supervision/safety;Standing       Lower Body Bathing: Minimal assistance;Sit to/from stand   Upper Body Dressing : Set up;Sitting   Lower Body Dressing: Minimal assistance;Sitting/lateral leans;Sit to/from Database Administrator: Supervision/safety;Rollator (4 wheels)   Toileting- Architect and Hygiene: Set up;Supervision/safety       Functional mobility during ADLs: Supervision/safety General ADL Comments: Pt min A for LB ADLs, instructed on use of sock aide, supervision with rollator, although likely needs more support due to  inconsistent balance, RW recommended over rollator.    Extremity/Trunk Assessment Upper Extremity Assessment Upper Extremity Assessment: Overall WFL for tasks assessed            Vision        Perception     Praxis     Communication Communication Communication: No apparent difficulties   Cognition Arousal: Alert Behavior During Therapy: WFL for tasks assessed/performed Cognition: No family/caregiver present to determine baseline             OT - Cognition Comments: overall able to participate, noticeable forgetfulness during session, Pt with difficulty following conversation at times.                 Following commands: Intact        Cueing   Cueing Techniques: Verbal cues, Gestural cues  Exercises      Shoulder Instructions       General Comments VSS on RA, noted urinary incontinence    Pertinent Vitals/ Pain       Pain Assessment Pain Assessment: No/denies pain  Home Living                                          Prior Functioning/Environment              Frequency  Min 2X/week        Progress Toward Goals  OT Goals(current goals can now be found in the care plan section)  Progress towards OT goals: Progressing toward goals  Acute Rehab OT Goals Patient Stated Goal: to improve balance, BLE strength OT Goal Formulation: With patient Time For Goal Achievement: 12/31/23 Potential to Achieve Goals: Good ADL Goals Pt Will Perform Grooming: with modified independence;sitting Pt Will Perform Upper Body Bathing: with modified independence;sitting Pt Will Perform Lower Body Bathing: with modified independence;with adaptive equipment;sit to/from stand Pt Will Perform Upper Body Dressing: with modified independence;sitting Pt Will Perform Lower Body Dressing: with modified independence;sit to/from stand Pt Will Transfer to Toilet: with supervision;ambulating  Plan      Co-evaluation                 AM-PAC OT 6 Clicks Daily Activity     Outcome Measure   Help from another person eating meals?: None Help from another person taking care of personal grooming?: None Help from another person toileting,  which includes using toliet, bedpan, or urinal?: A Little Help from another person bathing (including washing, rinsing, drying)?: A Little Help from another person to put on and taking off regular upper body clothing?: None Help from another person to put on and taking off regular lower body clothing?: A Little 6 Click Score: 21    End of Session Equipment Utilized During Treatment: Gait belt;Rollator (4 wheels)  OT Visit Diagnosis: Pain;Other symptoms and signs involving cognitive function;Other abnormalities of gait and mobility (R26.89);Unsteadiness on feet (R26.81)   Activity Tolerance Patient tolerated treatment well   Patient Left in chair;with call bell/phone within reach   Nurse Communication Mobility status        Time: 9061-8988 OT Time Calculation (min): 33 min  Charges: OT General Charges $OT Visit: 1 Visit OT Treatments $Self Care/Home Management : 23-37 mins  [place], OTR/L   Elouise JONELLE Bott 12/22/2023, 10:19 AM

## 2023-12-22 NOTE — Progress Notes (Signed)
 Physical Therapy Treatment Patient Details Name: [name] MRN: [id number] DOB: [birth year] Today's Date: 12/22/2023   History of Present Illness Pt is an 83 y/o female admitted for L4-5 laminectomy on 10/20 in setting of neurogenic claudication and severe spinal stenosis at L4-5 levels. She then was dc home and was seen in OP setting and was recommended to come back to the hospital due increase in pain and not able to complete ADLS. PMH: DM1, GERD, HTN, sleep apnea, vertigo    PT Comments  Pt progressing slowly towards all goals. Pt limiting factor is R knee buckling during ambulation and posterior LOB during static standing. Pt unsafe to care for self at home as she requires minA during static standing while she does ADLs at sink due to posterior LOB and is at increased risk of falling despite use of RW due to R knee buckling. Continue to recommending inpatient rehab program <3 hrs a day to allow for increased time to address below deficits and achieve safe mod I level of function for safe transition home. Acute PT to cont to follow.   If plan is discharge home, recommend the following: A little help with walking and/or transfers;A little help with bathing/dressing/bathroom;Assistance with cooking/housework;Assist for transportation;Help with stairs or ramp for entrance   Can travel by private vehicle     Yes  Equipment Recommendations  BSC/3in1    Recommendations for Other Services OT consult     Precautions / Restrictions Precautions Precautions: Fall;Back Precaution Booklet Issued: Yes (comment) Recall of Precautions/Restrictions: Impaired Precaution/Restrictions Comments: able to recall no bending only, pt re-educated on BLT Restrictions Weight Bearing Restrictions Per Provider Order: No     Mobility  Bed Mobility Overal bed mobility: Needs Assistance Bed Mobility: Rolling, Sidelying to Sit Rolling: Contact guard assist, Used rails Sidelying to sit: Contact guard  assist       General bed mobility comments: HOB slightly elevated, definite use of bed rails, increased time    Transfers Overall transfer level: Needs assistance Equipment used: Rolling walker (2 wheels) Transfers: Sit to/from Stand, Bed to chair/wheelchair/BSC Sit to Stand: Contact guard assist           General transfer comment: cues for hand placement and safety with backing all the way up to surface before attempting to sit down    Ambulation/Gait Ambulation/Gait assistance: Contact guard assist Gait Distance (Feet): 200 Feet Assistive device: Rolling walker (2 wheels) Gait Pattern/deviations: Step-through pattern, Knees buckling Gait velocity: reduced Gait velocity interpretation: <1.8 ft/sec, indicate of risk for recurrent falls   General Gait Details: slowed step-through gait, 1 standing rest break, with onset of fatigue vs increased distance pt with noted several occasions of R Knee buckling requiring minA to maintain balance, pt with 2 episodes of L knee buckling as well   Stairs             Wheelchair Mobility     Tilt Bed    Modified Rankin (Stroke Patients Only)       Balance Overall balance assessment: Needs assistance Sitting-balance support: No upper extremity supported, Feet supported Sitting balance-Leahy Scale: Good     Standing balance support: Single extremity supported, Reliant on assistive device for balance Standing balance-Leahy Scale: Poor Standing balance comment: multiple mild posterior losses of balance when without UE support when washing face and brushing teeth at sink, pt did reach down with R hand to grab sink to prevent posterior LOB  Communication Communication Communication: No apparent difficulties  Cognition Arousal: Alert Behavior During Therapy: WFL for tasks assessed/performed   PT - Cognitive impairments: No family/caregiver present to determine baseline                        PT - Cognition Comments: short term memory deficits, poor recall of precautions, decreased insight to safety Following commands: Intact      Cueing Cueing Techniques: Verbal cues, Gestural cues  Exercises      General Comments General comments (skin integrity, edema, etc.): VSS on RA, noted urinary incontinence      Pertinent Vitals/Pain Pain Assessment Pain Assessment: 0-10 Pain Score: 1  Pain Location: back Pain Descriptors / Indicators: Sore, Aching    Home Living                          Prior Function            PT Goals (current goals can now be found in the care plan section) Acute Rehab PT Goals Patient Stated Goal: Independent and safe at home PT Goal Formulation: With patient Time For Goal Achievement: 12/31/23 Potential to Achieve Goals: Good Progress towards PT goals: Progressing toward goals    Frequency    Min 2X/week      PT Plan      Co-evaluation              AM-PAC PT 6 Clicks Mobility   Outcome Measure  Help needed turning from your back to your side while in a flat bed without using bedrails?: A Little Help needed moving from lying on your back to sitting on the side of a flat bed without using bedrails?: A Little Help needed moving to and from a bed to a chair (including a wheelchair)?: A Little Help needed standing up from a chair using your arms (e.g., wheelchair or bedside chair)?: A Little Help needed to walk in hospital room?: A Little Help needed climbing 3-5 steps with a railing? : A Lot 6 Click Score: 17    End of Session Equipment Utilized During Treatment: Gait belt Activity Tolerance: Patient tolerated treatment well Patient left: in chair;with call bell/phone within reach;with chair alarm set Nurse Communication: Mobility status PT Visit Diagnosis: Unsteadiness on feet (R26.81);Pain Pain - Right/Left: Right Pain - part of body: Leg     Time: 9192-9159 PT Time Calculation (min) (ACUTE  ONLY): 33 min  Charges:    $Gait Training: 8-22 mins $Therapeutic Activity: 8-22 mins PT General Charges $$ ACUTE PT VISIT: 1 Visit                     Norene Ames, PT, DPT Acute Rehabilitation Services Secure chat preferred Office #: [phone]    Norene CHRISTELLA Ames 12/22/2023, 8:48 AM

## 2023-12-22 NOTE — TOC Progression Note (Addendum)
 Transition of Care [facility]) - Progression Note    Patient Details  Name: [name] MRN: [id number] Date of Birth: [birth year]  [facility]) CM/SW Contact  Lauraine FORBES Saa, LCSWA Phone Number: 12/22/2023, 9:22 AM  Clinical Narrative:     9:22 AM Patient's SNF insurance authorization remains pending. CSW will continue to follow.  2:42 PM Patient's SNF insurance authorization remains pending. CSW will continue to follow.  3:07 PM Peer to peer was offered for patient's SNF insurance authorization. MD and bedside RN made aware. CSW informed MD of contact information and deadline needed to conduct peer to peer [phone] option 5 tomorrow at 10:30AM). CSW will continue to follow.  4:24 PM MD informed CSW and bedside RN that P2P was denied. RNCM made aware. MD inquired about disposition options. CSW informed medical team of patient's disposition options (appeal SNF decision, pay privately for SNF, discharge home with [facility]). TOC will continue to follow.  Expected Discharge Plan: Skilled Nursing Facility Barriers to Discharge: Continued Medical Work up, English As A Second Language Teacher, SNF Pending bed offer               Expected Discharge Plan and Services In-house Referral: Clinical Social Work Discharge Planning Services: [employer] Consult Post Acute Care Choice: Skilled Nursing Facility Living arrangements for the past 2 months: Single Family Home                                       Social Drivers of Health (SDOH) Interventions SDOH Screenings   Food Insecurity: No Food Insecurity (12/17/2023)  Housing: Low Risk  (12/17/2023)  Transportation Needs: No Transportation Needs (12/17/2023)  Utilities: Not At Risk (12/17/2023)  Social Connections: Moderately Integrated (12/17/2023)  Tobacco Use: Low Risk  (12/05/2023)    Readmission Risk Interventions     No data to display

## 2023-12-22 NOTE — Plan of Care (Signed)

## 2023-12-22 NOTE — Progress Notes (Signed)
 Subjective:     Patient reports that her pain is improved but she still feels weak in her right leg.  She is able to ambulate but does not feel that she can handle her ADLs at home.    Objective: Vital signs in last 24 hours: Temp:  [98 F (36.7 C)-98.7 F (37.1 C)] 98 F (36.7 C) (11/03 0700) Pulse Rate:  [52-69] 52 (11/03 0700) Resp:  [15-18] 15 (11/03 0700) BP: (140-154)/(62-67) 154/67 (11/03 0700) SpO2:  [95 %-98 %] 98 % (11/02 2200)  Intake/Output from previous day: 11/02 0701 - 11/03 0700 In: -  Out: 600 [Urine:600]  Labs: No results for input(s): WBC, RBC, HCT, PLT in the last 72 hours. No results for input(s): NA, K, CL, CO2, BUN, CREATININE, GLUCOSE, CALCIUM  in the last 72 hours. No results for input(s): LABPT, INR in the last 72 hours.  Physical Exam:  There is no height or weight on file to calculate BMI.  Incision C/D/I  Sensation     Right      Left  L3   Intact     Intact L4   Intact     Intact L5    Intact     Intact S1   Intact     Intact    Motor Exam     Right     Left Iliopsoas  5/5     5/5  Quad   5/5     5/5  Hamstring  5/5     5/5  Tibials Anterior 5/5     5/5  EHL   5/5     5/5  Gastrocs  5/5     5/5      Assessment/Plan:  The patient is clinically stable.  She still has significant limitations ability ambulate.  With right leg buckling.  While she is making improvements, she is does not feel that she is able to care for herself at home.  I did perform peer to peer with her insurance company, at this point they are still denying a SNF level of care.  I discussed with the patient her options including appealing to her insurance company, going home with home health, and arranging for some private care.  At this point I will discuss this further with her daughter.  Her daughter and the patient will then discuss this further and develop a plan going forward  Stefanie Rhein, MD, MS [name]  Specialist [phone]

## 2023-12-23 DIAGNOSIS — J45909 Unspecified asthma, uncomplicated: Secondary | ICD-10-CM | POA: Diagnosis not present

## 2023-12-23 DIAGNOSIS — Z7982 Long term (current) use of aspirin: Secondary | ICD-10-CM | POA: Diagnosis not present

## 2023-12-23 DIAGNOSIS — E109 Type 1 diabetes mellitus without complications: Secondary | ICD-10-CM | POA: Diagnosis not present

## 2023-12-23 DIAGNOSIS — Z79899 Other long term (current) drug therapy: Secondary | ICD-10-CM | POA: Diagnosis not present

## 2023-12-23 DIAGNOSIS — M4800 Spinal stenosis, site unspecified: Secondary | ICD-10-CM | POA: Diagnosis not present

## 2023-12-23 DIAGNOSIS — I10 Essential (primary) hypertension: Secondary | ICD-10-CM | POA: Diagnosis not present

## 2023-12-23 LAB — GLUCOSE, CAPILLARY
Glucose-Capillary: 132 mg/dL — ABNORMAL HIGH (ref 70–99)
Glucose-Capillary: 265 mg/dL — ABNORMAL HIGH (ref 70–99)
Glucose-Capillary: 243 mg/dL — ABNORMAL HIGH (ref 70–99)
Glucose-Capillary: 480 mg/dL — ABNORMAL HIGH (ref 70–99)

## 2023-12-23 LAB — GLUCOSE, RANDOM: Glucose, Bld: 426 mg/dL — ABNORMAL HIGH (ref 70–99)

## 2023-12-23 NOTE — Inpatient Diabetes Management (Signed)
 Inpatient Diabetes Program Recommendations  AACE/ADA: New Consensus Statement on Inpatient Glycemic Control (2015)  Target Ranges:  Prepandial:   less than 140 mg/dL      Peak postprandial:   less than 180 mg/dL (1-2 hours)      Critically ill patients:  140 - 180 mg/dL   Lab Results  Component Value Date   GLUCAP 132 (H) 12/23/2023   HGBA1C 7.4 (H) 10/03/2023    Review of Glycemic Control  Latest Reference Range & Units 12/22/23 07:45 12/22/23 11:55 12/22/23 16:52 12/22/23 20:27 12/23/23 07:58  Glucose-Capillary 70 - 99 mg/dL 98 821 (H) 738 (H) 666 (H) 132 (H)   Diabetes history: DM 2 Outpatient Diabetes medications: Novolog  1-2 units bid before lunch and dinner, Tresiba  36 units Daily, Dexcom G7 Current orders for Inpatient glycemic control:  Novolog  0-15 units tid + hs Semglee  25 units Daily  A1c 7.4% 8/15  Note: Glucose trends increase after PO intake  Inpatient Diabetes Program Recommendations:    -   Add Novolog  4 units tid meal coverage if eating >50% of meals to prevent postprandial spikes.  Thanks,  Clotilda Bull RN, MSN, BC-ADM Inpatient Diabetes Coordinator Team Pager [phone] (8a-5p)

## 2023-12-23 NOTE — Progress Notes (Signed)
 Mobility Specialist Progress Note:    12/23/23 1119  Mobility  Activity Ambulated with assistance (In hallway)  Level of Assistance Contact guard assist, steadying assist  Assistive Device Front wheel walker  Distance Ambulated (ft) 350 ft  Activity Response Tolerated well  Mobility Referral Yes  Mobility visit 1 Mobility  Mobility Specialist Start Time (ACUTE ONLY) 0857  Mobility Specialist Stop Time (ACUTE ONLY) 0919  Mobility Specialist Time Calculation (min) (ACUTE ONLY) 22 min   Received pt in bed and eager for mobility. Pt required MinG for safety. Pt c/o BLE knee buckling, otherwise tolerated well. Returned to room without fault. Left pt in chair with alarm on. Personal belongings and call light within reach. All needs met.  Lavanda Pollack Mobility Specialist  Please contact via [employer] or  Rehab Office [phone]

## 2023-12-23 NOTE — TOC Progression Note (Addendum)
 Transition of Care [facility]) - Progression Note    Patient Details  Name: [name] MRN: [id number] Date of Birth: Jun 24, 1940  Transition [facility] Care [facility]) CM/SW Contact  Marval Gell, RN Phone Number: 12/23/2023, 1:48 PM  Clinical Narrative:     Notified by CSW that patient was denied for SNF, requested to set up with [facility] services.  CSW to update patient's daughter.  Spoke w patient in room. She was active w [facility] prior to admission and would like to continue services with them. Notified liaison.  Patient states that she has RW and shower seat at home. She states that she will have friends provide transportation home  Asked MD when he planned for DC date, DC planned for tomorrow 12/24/2023, per patient request  Expected Discharge Plan: Home w Home Health Services Barriers to Discharge: Continued Medical Work up               Expected Discharge Plan and Services In-house Referral: Clinical Social Work Discharge Planning Services: CM Consult Post Acute Care Choice: Skilled Nursing Facility Living arrangements for the past 2 months: Single Family Home Expected Discharge Date: 12/24/23                         HH Arranged: PT, OT HH Agency: [facility] Date [facility] Agency Contacted: 12/23/23 Time HH Agency Contacted: 1348 Representative spoke with at [facility] Agency: Amy   Social Drivers of Health (SDOH) Interventions SDOH Screenings   Food Insecurity: No Food Insecurity (12/17/2023)  Housing: Low Risk  (12/17/2023)  Transportation Needs: No Transportation Needs (12/17/2023)  Utilities: Not At Risk (12/17/2023)  Social Connections: Moderately Integrated (12/17/2023)  Tobacco Use: Low Risk  (12/05/2023)    Readmission Risk Interventions     No data to display

## 2023-12-23 NOTE — TOC Progression Note (Signed)
 Transition of Care [facility]) - Progression Note    Patient Details  Name: [name] MRN: [id number] Date of Birth: Feb 20, 1940  Transition of Care [facility]) CM/SW Contact  Lauraine FORBES Saa, LCSWA Phone Number: 12/23/2023, 2:02 PM  Clinical Narrative:     2:02 PM CSW informed patient of SNF insuraance authorization denial and provided denial/appeal information. Patient expressed preference in discharging home vs appealing decision. Patient consented CSW to relay information to patient's daughter, [name]. CSW relayed information to [name], who [facility] for patient medication management. RNCM made aware.   Expected Discharge Plan: Home w Home Health Services Barriers to Discharge: Continued Medical Work up               Expected Discharge Plan and Services In-house Referral: Clinical Social Work Discharge Planning Services: CM Consult Post Acute Care Choice: Home Health Living arrangements for the past 2 months: Single Family Home Expected Discharge Date: 12/24/23                         HH Arranged: PT, OT HH Agency: [facility] Date HH Agency Contacted: 12/23/23 Time HH Agency Contacted: 1348 Representative spoke with at [facility] Agency: Amy   Social Drivers of Health (SDOH) Interventions SDOH Screenings   Food Insecurity: No Food Insecurity (12/17/2023)  Housing: Low Risk  (12/17/2023)  Transportation Needs: No Transportation Needs (12/17/2023)  Utilities: Not At Risk (12/17/2023)  Social Connections: Moderately Integrated (12/17/2023)  Tobacco Use: Low Risk  (12/05/2023)    Readmission Risk Interventions     No data to display

## 2023-12-23 NOTE — Plan of Care (Signed)
  Problem: Education: Goal: Knowledge of General Education information will improve Description: Including pain rating scale, medication(s)/side effects and non-pharmacologic comfort measures Outcome: Progressing   Problem: Activity: Goal: Risk for activity intolerance will decrease Outcome: Progressing   Problem: Pain Managment: Goal: General experience of comfort will improve and/or be controlled Outcome: Progressing

## 2023-12-24 ENCOUNTER — Other Ambulatory Visit (HOSPITAL_COMMUNITY): Payer: Self-pay

## 2023-12-24 DIAGNOSIS — J45909 Unspecified asthma, uncomplicated: Secondary | ICD-10-CM | POA: Diagnosis not present

## 2023-12-24 DIAGNOSIS — E109 Type 1 diabetes mellitus without complications: Secondary | ICD-10-CM | POA: Diagnosis not present

## 2023-12-24 DIAGNOSIS — Z7982 Long term (current) use of aspirin: Secondary | ICD-10-CM | POA: Diagnosis not present

## 2023-12-24 DIAGNOSIS — I10 Essential (primary) hypertension: Secondary | ICD-10-CM | POA: Diagnosis not present

## 2023-12-24 DIAGNOSIS — Z79899 Other long term (current) drug therapy: Secondary | ICD-10-CM | POA: Diagnosis not present

## 2023-12-24 DIAGNOSIS — M4800 Spinal stenosis, site unspecified: Secondary | ICD-10-CM | POA: Diagnosis not present

## 2023-12-24 LAB — GLUCOSE, CAPILLARY
Glucose-Capillary: 263 mg/dL — ABNORMAL HIGH (ref 70–99)
Glucose-Capillary: 293 mg/dL — ABNORMAL HIGH (ref 70–99)
Glucose-Capillary: 334 mg/dL — ABNORMAL HIGH (ref 70–99)
Glucose-Capillary: 313 mg/dL — ABNORMAL HIGH (ref 70–99)

## 2023-12-24 MED ORDER — INSULIN ASPART 100 UNIT/ML IJ SOLN
6.0000 [IU] | Freq: Once | INTRAMUSCULAR | Status: AC
  Administered 2023-12-24: 6 [IU] via SUBCUTANEOUS
  Filled 2023-12-24: qty 6

## 2023-12-24 MED ORDER — OXYCODONE-ACETAMINOPHEN 5-325 MG PO TABS
1.0000 | ORAL_TABLET | ORAL | 0 refills | Status: AC | PRN
  Filled 2023-12-24 – 2023-12-26 (×3): qty 30, 5d supply, fill #0

## 2023-12-24 NOTE — Progress Notes (Signed)
 Occupational Therapy Treatment Patient Details Name: [name] MRN: [id number] DOB: 19-Jul-1940 Today's Date: 12/24/2023   History of present illness Pt is an 83 y/o female admitted for L4-5 laminectomy on 10/20 in setting of neurogenic claudication and severe spinal stenosis at L4-5 levels. She then was dc home and was seen in OP setting and was recommended to come back to the hospital due increase in pain and not able to complete ADLS. PMH: DM1, GERD, HTN, sleep apnea, vertigo   OT comments  Pt presented in bed and agreeable to session. Pt was able to voice all precautions but when actually completion of ADLs needs cues for safety and problem solving. Pt was able to complete bed mobility at about 15 deg HOB elevation and no rail present with occasional cues on log roll and then complete sit to stand transfer with supervision to CGA with cues on hand placement. She then completed toileting tasks with CGA with cues on hand placement and changed adult brief with long handle reacher and min-mod cues on how to sequence. She then required set up of dressing materials and completed UE dressing in sitting position and LE dressing with CGA. Patient will benefit from continued inpatient follow up therapy, <3 hours/day. However, due to insurance denial then would recommend [facility] services with aide. As pt expressed uncertainty on how they were going to care for self. Pt when asked about completion meal preparation and getting groceries they reported they are going to do the best they can.   Pt in session was recommended to maximize assist from any neighbors and have family come into town to assist with getting meal preporation. Also, to have phone with them for call alert and call family daily for check ins. Pt was educated throughout the session on modification on tasks to decrease in fall risks with the return to home.       If plan is discharge home, recommend the following:  A little help with walking  and/or transfers;A little help with bathing/dressing/bathroom;Assistance with cooking/housework;Assist for transportation;Help with stairs or ramp for entrance;Supervision due to cognitive status   Equipment Recommendations  BSC/3in1    Recommendations for Other Services      Precautions / Restrictions Precautions Precautions: Fall;Back Precaution Booklet Issued: Yes (comment) Recall of Precautions/Restrictions: Impaired Precaution/Restrictions Comments: pt was able to recall all precautions but then when actually completion of ADLs needs cues to follow Restrictions Weight Bearing Restrictions Per Provider Order: No       Mobility Bed Mobility Overal bed mobility: Needs Assistance Bed Mobility: Rolling, Supine to Sit Rolling: Supervision Sidelying to sit: Supervision Supine to sit: Supervision          Transfers Overall transfer level: Needs assistance Equipment used: Rolling walker (2 wheels) Transfers: Sit to/from Stand Sit to Stand: Supervision                 Balance Overall balance assessment: Needs assistance Sitting-balance support: Feet supported Sitting balance-Leahy Scale: Good     Standing balance support: Bilateral upper extremity supported, No upper extremity supported, Single extremity supported Standing balance-Leahy Scale: Fair Standing balance comment: pt does better with BUE support as will start to have a postural sway                           ADL either performed or assessed with clinical judgement   ADL Overall ADL's : Needs assistance/impaired Eating/Feeding: Independent   Grooming: Wash/dry hands;Wash/dry  face;Supervision/safety;Standing   Upper Body Bathing: Supervision/ safety;Sitting   Lower Body Bathing: Contact guard assist;Sit to/from stand Lower Body Bathing Details (indicate cue type and reason): pt cued on how they can complete tasks Upper Body Dressing : Supervision/safety;Sitting   Lower Body Dressing:  Contact guard assist;Supervision/safety;Sit to/from stand Lower Body Dressing Details (indicate cue type and reason): pt used long handle reacher Toilet Transfer: Supervision/safety;Rolling walker (2 wheels);Contact guard assist   Toileting- Clothing Manipulation and Hygiene: Contact guard assist;Supervision/safety;Sit to/from stand       Functional mobility during ADLs: Supervision/safety;Contact guard assist;Rolling walker (2 wheels) General ADL Comments: cues with turns as often twisting    Extremity/Trunk Assessment Upper Extremity Assessment Upper Extremity Assessment: Overall WFL for tasks assessed   Lower Extremity Assessment Lower Extremity Assessment: Defer to PT evaluation        Vision       Perception Perception Perception: Within Functional Limits   Praxis Praxis Praxis: [facility]   Communication Communication Communication: No apparent difficulties   Cognition Arousal: Alert Behavior During Therapy: WFL for tasks assessed/performed Cognition: No family/caregiver present to determine baseline             OT - Cognition Comments: pt noted in session often needs cues on sequence through things and problem solve                 Following commands: Intact        Cueing   Cueing Techniques: Verbal cues, Gestural cues  Exercises      Shoulder Instructions       General Comments      Pertinent Vitals/ Pain       Pain Assessment Pain Assessment: No/denies pain Pain Score: 0-No pain Faces Pain Scale: No hurt  Home Living                                          Prior Functioning/Environment              Frequency  Min 2X/week        Progress Toward Goals  OT Goals(current goals can now be found in the care plan section)  Progress towards OT goals: Progressing toward goals  Acute Rehab OT Goals Patient Stated Goal: to do what I can OT Goal Formulation: With patient Time For Goal Achievement:  12/31/23 Potential to Achieve Goals: Good ADL Goals Pt Will Perform Grooming: with modified independence;sitting Pt Will Perform Upper Body Bathing: with modified independence;sitting Pt Will Perform Lower Body Bathing: with modified independence;with adaptive equipment;sit to/from stand Pt Will Perform Upper Body Dressing: with modified independence;sitting Pt Will Perform Lower Body Dressing: with modified independence;sit to/from stand Pt Will Transfer to Toilet: with supervision;ambulating  Plan      Co-evaluation                 AM-PAC OT 6 Clicks Daily Activity     Outcome Measure   Help from another person eating meals?: None Help from another person taking care of personal grooming?: A Little Help from another person toileting, which includes using toliet, bedpan, or urinal?: A Little Help from another person bathing (including washing, rinsing, drying)?: A Little Help from another person to put on and taking off regular upper body clothing?: A Little Help from another person to put on and taking off regular lower body clothing?: A Little 6 Click Score: 19  End of Session Equipment Utilized During Treatment: Gait belt;Rolling walker (2 wheels)  OT Visit Diagnosis: Pain;Other symptoms and signs involving cognitive function;Other abnormalities of gait and mobility (R26.89);Unsteadiness on feet (R26.81) Pain - part of body:  (back)   Activity Tolerance Patient tolerated treatment well   Patient Left in chair;with call bell/phone within reach;with chair alarm set   Nurse Communication Mobility status        Time: 9269-9196 OT Time Calculation (min): 33 min  Charges: OT General Charges $OT Visit: 1 Visit OT Treatments $Self Care/Home Management : 23-37 mins  Warrick POUR OTR/L  Acute Rehab Services  [phone] office number   Warrick Berber 12/24/2023, 8:12 AM

## 2023-12-24 NOTE — Plan of Care (Signed)
  Problem: Education: Goal: Knowledge of General Education information will improve Description: Including pain rating scale, medication(s)/side effects and non-pharmacologic comfort measures Outcome: Progressing   Problem: Health Behavior/Discharge Planning: Goal: Ability to manage health-related needs will improve Outcome: Progressing   Problem: Clinical Measurements: Goal: Ability to maintain clinical measurements within normal limits will improve Outcome: Progressing Goal: Will remain free from infection Outcome: Progressing Goal: Diagnostic test results will improve Outcome: Progressing Goal: Respiratory complications will improve Outcome: Progressing Goal: Cardiovascular complication will be avoided Outcome: Progressing   Problem: Clinical Measurements: Goal: Will remain free from infection Outcome: Progressing   Problem: Clinical Measurements: Goal: Diagnostic test results will improve Outcome: Progressing   Problem: Clinical Measurements: Goal: Respiratory complications will improve Outcome: Progressing   Problem: Activity: Goal: Risk for activity intolerance will decrease Outcome: Progressing   Problem: Skin Integrity: Goal: Risk for impaired skin integrity will decrease Outcome: Progressing   Problem: Education: Goal: Ability to describe self-care measures that may prevent or decrease complications (Diabetes Survival Skills Education) will improve Outcome: Progressing Goal: Individualized Educational Video(s) Outcome: Progressing   Problem: Nutritional: Goal: Maintenance of adequate nutrition will improve Outcome: Progressing Goal: Progress toward achieving an optimal weight will improve Outcome: Progressing   Problem: Skin Integrity: Goal: Risk for impaired skin integrity will decrease Outcome: Progressing   Problem: Tissue Perfusion: Goal: Adequacy of tissue perfusion will improve Outcome: Progressing

## 2023-12-24 NOTE — Progress Notes (Signed)
 Subjective:     Patient is resting comfortably bed.  She has no complaints  Objective: Vital signs in last 24 hours: Temp:  [98.2 F (36.8 C)-98.8 F (37.1 C)] 98.8 F (37.1 C) (11/05 0500) Pulse Rate:  [56-68] 56 (11/05 0500) Resp:  [16-18] 18 (11/05 0500) BP: (145-164)/(57-65) 145/63 (11/05 0500) SpO2:  [96 %-99 %] 96 % (11/05 0500)  Intake/Output from previous day: No intake/output data recorded.  Labs: No results for input(s): WBC, RBC, HCT, PLT in the last 72 hours. Recent Labs    12/23/23 2250  GLUCOSE 426*   No results for input(s): LABPT, INR in the last 72 hours.  Physical Exam:  There is no height or weight on file to calculate BMI.  Incision C/D/I  Sensation     Right      Left  L3   Intact     Intact L4   Intact     Intact L5    Intact     Intact S1   Intact     Intact    Motor Exam     Right     Left Iliopsoas  5/5     5/5  Quad   5/5     5/5  Hamstring  5/5     5/5  Tibials Anterior 5/5     5/5  EHL   5/5     5/5  Gastrocs  5/5     5/5      Assessment/Plan: Patient is currently comfortable.  She is scheduled go home today with home health.  Will have therapy work with her this morning if they are able to.  Otherwise we will make arrangements for her to be discharged home later on today to follow-up with me in 2 weeks time  Cordella Rhein, MD, MS [facility] [phone]

## 2023-12-24 NOTE — TOC Progression Note (Signed)
 Transition of Care [facility]) - Progression Note    Patient Details  Name: [name] MRN: [id number] Date of Birth: [birth year]  [facility]) CM/SW Contact  Lauraine FORBES Saa, LCSWA Phone Number: 12/24/2023, 12:48 PM  Clinical Narrative:     12:48 PM CSW made aware by PT that patient is now agreeable with appealing SNF decision. Patient confirmed appeal decision and consented CSW to request appeal with her and patient's daughter, [name]. SNF appeal was successfully submitted ([id number]). Medical team and SNF made aware. TOC will continue to follow.  Expected Discharge Plan: Home w Home Health Services Barriers to Discharge: Other (must enter comment) (SNF Appeal)               Expected Discharge Plan and Services In-house Referral: Clinical Social Work Discharge Planning Services: CM Consult Post Acute Care Choice: Home Health Living arrangements for the past 2 months: Single Family Home Expected Discharge Date: 12/24/23                         HH Arranged: PT, OT HH Agency: [facility] Date HH Agency Contacted: 12/23/23 Time HH Agency Contacted: 1348 Representative spoke with at [facility] Agency: Amy   Social Drivers of Health (SDOH) Interventions SDOH Screenings   Food Insecurity: No Food Insecurity (12/17/2023)  Housing: Low Risk  (12/17/2023)  Transportation Needs: No Transportation Needs (12/17/2023)  Utilities: Not At Risk (12/17/2023)  Social Connections: Moderately Integrated (12/17/2023)  Tobacco Use: Low Risk  (12/05/2023)    Readmission Risk Interventions     No data to display

## 2023-12-24 NOTE — Progress Notes (Signed)
 Physical Therapy Treatment Patient Details Name: SHERRIA RIEMANN MRN: [id number] DOB: 05-30-1940 Today's Date: 12/24/2023   History of Present Illness Pt is an 83 y/o female admitted for L4-5 laminectomy on 10/20 in setting of neurogenic claudication and severe spinal stenosis at L4-5 levels. She then was dc home and was seen in OP setting and was recommended to come back to the hospital due increase in pain and not able to complete ADLS. PMH: DM1, GERD, HTN, sleep apnea, vertigo    PT Comments  Continuing work on functional mobility and activity tolerance;  session focused on functional transfers, amb with RW, and especially stair training -- noting insurance denied SNF stay for post-acute rehab, and at this point, there is no one who can stay with her at home to provide physical assist at home -- and she has stairs to access her bedroom and full bathroom;   Managing safely at home alone is much more than simply walking a certain distance; to manage safely, Ms. Osgood will need to safely and steadily manage necessary activities like meal prep, toileting, and negotiating architectural barriers like her stairs (please see stair comments in mobility section in addition to these PT comments); Per OT note:  pt expressed uncertainty on how they were going to care for self. Pt when asked about completion meal preparation and getting groceries they reported they are going to do the best they can.   Her dependence on momentum and semi-hop/jump to get up a step, instead of controlled consistent strength-based power up, is concerning for incr fall risk; in the post-acute rehab setting she will have the incr time and consistent daily PT and OT for meaningful work on strength and endurance; at this point for pt to go home and have to negotiate a flight of steps to access her bedroom puts her at a high fall risk-- and a fall on a flight of stairs is potentially catastrophic for her; My concern is that if she goes  home at her current functional level, the architectural barrier of the stairs will make accessing her bedroom, or full bathroom more of a hardship; Due to pt current functional status, PLOF, home set up and available assistance at home recommending skilled physical therapy services< 3 hours/day on discharge from acute care hospital setting in order to decrease risk for falls, injury, immobility, skin break down and re-hospitalization.      If plan is discharge home, recommend the following: A little help with walking and/or transfers;A little help with bathing/dressing/bathroom;Assistance with cooking/housework;Assist for transportation;Help with stairs or ramp for entrance   Can travel by private vehicle     Yes  Equipment Recommendations  BSC/3in1;Hospital bed;Other (comment) (would like a ramp (possibly a temporary ramp) for steps to front door; no rails at steps to front door, consider non-emergent ambulance transport home or arranging for solid assistance ascending those steps (that don't have rails))    Recommendations for Other Services       Precautions / Restrictions Precautions Precautions: Fall;Back Precaution Booklet Issued: Yes (comment) Recall of Precautions/Restrictions: Impaired Precaution/Restrictions Comments: pt was able to recall all precautions but then when actually completion of ADLs needs cues to follow Restrictions Weight Bearing Restrictions Per Provider Order: No     Mobility  Bed Mobility                    Transfers Overall transfer level: Needs assistance Equipment used: Rolling walker (2 wheels) Transfers: Sit to/from Stand Sit to Stand:  Supervision, Min assist           General transfer comment: Supervision for safety with rise, min cues to push up from armrests; Min assist for safety, RW management, and control of descent to sitting; fatigued after work on gait and stairs    Ambulation/Gait Ambulation/Gait assistance: Contact guard  assist, Min assist Gait Distance (Feet): 20 Feet (x2) Assistive device: Rolling walker (2 wheels) Gait Pattern/deviations: Step-through pattern, Knees buckling       General Gait Details: Walked from recliner (parked near nurses station) to stairwell and then back after stair training; Notably fatigued post stair training, and needing min assist for steadiness and safety   Stairs Stairs: Yes Stairs assistance: Mod assist Stair Management: Two rails, One rail Right, Step to pattern, Forwards Number of Stairs: 7 General stair comments: Notably anxious with practice in stairwell; Leads ascending with RLE, and needing mod assist to support at posterior pelvis and trunk due to RLE weakness; needs stabilizing assist, especially as RLE takes most of her weight while she brings her LLE up; has to push off with LLE with a semi-hop vaulting-like motion, dependent on momentum to get her LLE up to the RLE, and then can fully extend both hips and knees together to stand on step; very energetically taxing; very unsafe to attempt alone   Wheelchair Mobility     Tilt Bed    Modified Rankin (Stroke Patients Only)       Balance     Sitting balance-Leahy Scale: Good       Standing balance-Leahy Scale: Fair Standing balance comment: pt does better with BUE support as will start to have a postural sway, puttin gher at a high risk of falls                            Communication Communication Communication: No apparent difficulties  Cognition Arousal: Alert Behavior During Therapy: WFL for tasks assessed/performed                           PT - Cognition Comments: decreased insight to safety Following commands: Intact      Cueing Cueing Techniques: Verbal cues, Gestural cues  Exercises      General Comments General comments (skin integrity, edema, etc.): NAD on RA; took time to discuss safety concerns re: dc home; described teh difference between SNF for  rehab with the goal of getting stronger to manage at home and SNF to long-term care, which pt does not need at this time      Pertinent Vitals/Pain Pain Assessment Pain Assessment: Faces Faces Pain Scale: Hurts a little bit Pain Location: back and R knee at end of session Pain Descriptors / Indicators: Sore, Aching Pain Intervention(s): Monitored during session, Repositioned    Home Living                          Prior Function            PT Goals (current goals can now be found in the care plan section) Acute Rehab PT Goals Patient Stated Goal: Independent and safe at home PT Goal Formulation: With patient Time For Goal Achievement: 12/31/23 Potential to Achieve Goals: Good Progress towards PT goals: Progressing toward goals    Frequency    Min 2X/week      PT Plan      Co-evaluation  AM-PAC PT 6 Clicks Mobility   Outcome Measure  Help needed turning from your back to your side while in a flat bed without using bedrails?: A Little Help needed moving from lying on your back to sitting on the side of a flat bed without using bedrails?: A Little Help needed moving to and from a bed to a chair (including a wheelchair)?: A Little Help needed standing up from a chair using your arms (e.g., wheelchair or bedside chair)?: A Little Help needed to walk in hospital room?: A Little Help needed climbing 3-5 steps with a railing? : A Lot 6 Click Score: 17    End of Session Equipment Utilized During Treatment: Gait belt Activity Tolerance: Patient tolerated treatment well;Patient limited by fatigue;Other (comment) (anxiuos on stairs) Patient left: in chair;with call bell/phone within reach;with chair alarm set Nurse Communication: Mobility status PT Visit Diagnosis: Unsteadiness on feet (R26.81);Pain Pain - Right/Left: Right Pain - part of body: Leg     Time: 0915-1018 PT Time Calculation (min) (ACUTE ONLY): 63 min  Charges:    $Gait  Training: 38-52 mins $Self Care/Home Management: 8-22 PT General Charges $$ ACUTE PT VISIT: 1 Visit                     Silvano Currier, PT  Acute Rehabilitation Services Office [phone] Secure Chat welcomed    Silvano VEAR Currier 12/24/2023, 11:09 AM

## 2023-12-25 ENCOUNTER — Encounter (HOSPITAL_COMMUNITY): Payer: Self-pay

## 2023-12-25 ENCOUNTER — Other Ambulatory Visit (HOSPITAL_COMMUNITY): Payer: Self-pay

## 2023-12-25 ENCOUNTER — Other Ambulatory Visit: Payer: Self-pay

## 2023-12-25 DIAGNOSIS — Z79899 Other long term (current) drug therapy: Secondary | ICD-10-CM | POA: Diagnosis not present

## 2023-12-25 DIAGNOSIS — J45909 Unspecified asthma, uncomplicated: Secondary | ICD-10-CM | POA: Diagnosis not present

## 2023-12-25 DIAGNOSIS — E109 Type 1 diabetes mellitus without complications: Secondary | ICD-10-CM | POA: Diagnosis not present

## 2023-12-25 DIAGNOSIS — M4800 Spinal stenosis, site unspecified: Secondary | ICD-10-CM | POA: Diagnosis not present

## 2023-12-25 DIAGNOSIS — I10 Essential (primary) hypertension: Secondary | ICD-10-CM | POA: Diagnosis not present

## 2023-12-25 DIAGNOSIS — Z7982 Long term (current) use of aspirin: Secondary | ICD-10-CM | POA: Diagnosis not present

## 2023-12-25 LAB — GLUCOSE, CAPILLARY
Glucose-Capillary: 119 mg/dL — ABNORMAL HIGH (ref 70–99)
Glucose-Capillary: 219 mg/dL — ABNORMAL HIGH (ref 70–99)
Glucose-Capillary: 112 mg/dL — ABNORMAL HIGH (ref 70–99)
Glucose-Capillary: 399 mg/dL — ABNORMAL HIGH (ref 70–99)

## 2023-12-25 NOTE — Progress Notes (Signed)
 Physical Therapy Treatment Patient Details Name: [name] MRN: [id number] DOB: 06-Mar-1940 Today's Date: 12/25/2023   History of Present Illness Pt is an 83 y/o female admitted for L4-5 laminectomy on 10/20 in setting of neurogenic claudication and severe spinal stenosis at L4-5 levels. She then was dc home and was seen in OP setting and was recommended to come back to the hospital due increase in pain and not able to complete ADLS. PMH: DM1, GERD, HTN, sleep apnea, vertigo    PT Comments  The pt was agreeable to session with focus on stair training in preparation for possible d/c home due to insurance denial of rehab. The pt continues to require modA to power up each step and up to maxA at times to maintain balance on stairs without use of rails (pt has 3 + 3 stairs to enter her home without rails). The pt reports she has a friend who can assist, was educated in various techniques both with and without RW. The pt was able to progress ambulation distance, but continues to demo increased need for assist with exertion, especially with RLE. Continue to recommend in-patient therapies <3hours/day to progress functional strength in LE, stability, endurance, and capacity to complete stairs as pt currently lacks assist at her home and is at high risk of falls.     If plan is discharge home, recommend the following: A little help with walking and/or transfers;A little help with bathing/dressing/bathroom;Assistance with cooking/housework;Assist for transportation;Help with stairs or ramp for entrance   Can travel by private vehicle     Yes  Equipment Recommendations  BSC/3in1;Hospital bed;Other (comment) (would like a ramp (possibly a temporary ramp) for steps to front door; no rails at steps to front door, consider non-emergent ambulance transport home or arranging for solid assistance ascending those steps (that don't have rails))    Recommendations for Other Services       Precautions /  Restrictions Precautions Precautions: Fall;Back Precaution Booklet Issued: Yes (comment) Recall of Precautions/Restrictions: Impaired Precaution/Restrictions Comments: pt was able to recall all precautions but then when actually completion of ADLs needs cues to follow Restrictions Weight Bearing Restrictions Per Provider Order: No     Mobility  Bed Mobility               General bed mobility comments: OOB at start and end of session    Transfers Overall transfer level: Needs assistance Equipment used: Rolling walker (2 wheels) Transfers: Sit to/from Stand Sit to Stand: Supervision, Min assist           General transfer comment: Supervision for safety with rise, min cues to push up from armrests; Min assist for safety, RW management, and control of descent to sitting; fatigued after work on gait and stairs    Ambulation/Gait Ambulation/Gait assistance: Contact guard assist, Min assist Gait Distance (Feet): 15 Feet (+ 15 ft + 117ft) Assistive device: Rolling walker (2 wheels) Gait Pattern/deviations: Step-through pattern, Knees buckling Gait velocity: decreased Gait velocity interpretation: <1.31 ft/sec, indicative of household ambulator   General Gait Details: up to minA with fatigue, R knee buckling at times and pt with decreased stance time. chair follow for safety. VSS   Stairs Stairs: Yes Stairs assistance: Mod assist Stair Management: One rail Right, Step to pattern, Forwards, Backwards, With walker Number of Stairs: 2 (+2) General stair comments: attemtped with HHA in LUE and no rail in RUE, pt needing modA to power up on stair and maxA to steady. completed x2 before needing to  hold rail for increased support. limited power and stability with each step. then educated on backwards technique with RW and pt expressed more concern about stability.   Wheelchair Mobility     Tilt Bed    Modified Rankin (Stroke Patients Only)       Balance Overall balance  assessment: Needs assistance Sitting-balance support: Feet supported Sitting balance-Leahy Scale: Good     Standing balance support: Bilateral upper extremity supported, No upper extremity supported, Single extremity supported Standing balance-Leahy Scale: Fair Standing balance comment: pt does better with BUE support as will start to have a postural sway, putting her at a high risk of falls                            Communication Communication Communication: No apparent difficulties  Cognition Arousal: Alert Behavior During Therapy: WFL for tasks assessed/performed                           PT - Cognition Comments: decreased insight to safety Following commands: Intact      Cueing Cueing Techniques: Verbal cues, Gestural cues  Exercises      General Comments General comments (skin integrity, edema, etc.): VSS, education on various stair techniques      Pertinent Vitals/Pain Pain Assessment Faces Pain Scale: Hurts a little bit Pain Location: back and R knee at end of session Pain Descriptors / Indicators: Sore, Aching     PT Goals (current goals can now be found in the care plan section) Acute Rehab PT Goals Patient Stated Goal: Independent and safe at home PT Goal Formulation: With patient Time For Goal Achievement: 12/31/23 Potential to Achieve Goals: Good Progress towards PT goals: Progressing toward goals    Frequency    Min 2X/week       AM-PAC PT 6 Clicks Mobility   Outcome Measure  Help needed turning from your back to your side while in a flat bed without using bedrails?: A Little Help needed moving from lying on your back to sitting on the side of a flat bed without using bedrails?: A Little Help needed moving to and from a bed to a chair (including a wheelchair)?: A Little Help needed standing up from a chair using your arms (e.g., wheelchair or bedside chair)?: A Little Help needed to walk in hospital room?: A  Little Help needed climbing 3-5 steps with a railing? : A Lot 6 Click Score: 17    End of Session Equipment Utilized During Treatment: Gait belt Activity Tolerance: Patient tolerated treatment well;Patient limited by fatigue;Other (comment) (anxious on stairs) Patient left: in chair;with call bell/phone within reach;with chair alarm set Nurse Communication: Mobility status PT Visit Diagnosis: Unsteadiness on feet (R26.81);Pain Pain - Right/Left: Right Pain - part of body: Leg     Time: 8882-8853 PT Time Calculation (min) (ACUTE ONLY): 29 min  Charges:    $Gait Training: 8-22 mins $Therapeutic Exercise: 8-22 mins PT General Charges $$ ACUTE PT VISIT: 1 Visit                     Izetta Call, PT, DPT   Acute Rehabilitation Department Office [phone] Secure Chat Communication Preferred   Izetta JULIANNA Call 12/25/2023, 12:28 PM

## 2023-12-25 NOTE — Progress Notes (Signed)
 Subjective:     Patient is resting in bed.  She reports continued discomfort in the outer part of her right thigh though this is not severe.  She reports continued difficulty with ambulation and activities of daily living.  She is now appealing her SNF decision  Objective: Vital signs in last 24 hours: Temp:  [98.2 F (36.8 C)-98.8 F (37.1 C)] 98.2 F (36.8 C) (11/06 0554) Pulse Rate:  [50-67] 50 (11/06 0554) Resp:  [16-18] 18 (11/06 0554) BP: (137-162)/(56-70) 137/56 (11/06 0554) SpO2:  [98 %-100 %] 100 % (11/06 0554)  Intake/Output from previous day: No intake/output data recorded.  Labs: No results for input(s): WBC, RBC, HCT, PLT in the last 72 hours. Recent Labs    12/23/23 2250  GLUCOSE 426*   No results for input(s): LABPT, INR in the last 72 hours.  Physical Exam:  There is no height or weight on file to calculate BMI.  Incision C/D/I  Sensation     Right      Left  L3   Intact     Intact L4   Intact     Intact L5    Intact     Intact S1   Intact     Intact    Motor Exam     Right     Left Iliopsoas  5/5     5/5  Quad   5/5     5/5  Hamstring  5/5     5/5  Tibials Anterior 5/5     5/5  EHL   5/5     5/5  Gastrocs  5/5     5/5    Mildly tender over the right greater trochanter   Assessment/Plan: For orthopedic standpoint, the patient is overall stable.  She still reports difficulty with her activities of daily living does not feel that she is comfortable going home even with home health.  She is appealing her decision for the SNF rehab.  Will await this determination.  Cordella Rhein, MD, MS [facility] [phone]

## 2023-12-25 NOTE — TOC Progression Note (Signed)
 Transition of Care [facility]) - Progression Note    Patient Details  Name: [name] MRN: [id number] Date of Birth: 1940/08/01  Transition of Care [facility]) CM/SW Contact  Lauraine FORBES Saa, LCSWA Phone Number: 12/25/2023, 3:27 PM  Clinical Narrative:     3:27 PM Per Mylene, patient's SNF appeal is currently pending. TOC will continue to follow.  Expected Discharge Plan: Home w Home Health Services Barriers to Discharge: Other (must enter comment) (SNF Appeal)               Expected Discharge Plan and Services In-house Referral: Clinical Social Work Discharge Planning Services: CM Consult Post Acute Care Choice: Home Health Living arrangements for the past 2 months: Single Family Home Expected Discharge Date: 12/24/23                         HH Arranged: PT, OT HH Agency: [facility] Date HH Agency Contacted: 12/23/23 Time HH Agency Contacted: 1348 Representative spoke with at [facility] Agency: Amy   Social Drivers of Health (SDOH) Interventions SDOH Screenings   Food Insecurity: No Food Insecurity (12/17/2023)  Housing: Low Risk  (12/17/2023)  Transportation Needs: No Transportation Needs (12/17/2023)  Utilities: Not At Risk (12/17/2023)  Social Connections: Moderately Integrated (12/17/2023)  Tobacco Use: Low Risk  (12/05/2023)    Readmission Risk Interventions     No data to display

## 2023-12-25 NOTE — Plan of Care (Signed)
  Problem: Education: Goal: Knowledge of General Education information will improve Description: Including pain rating scale, medication(s)/side effects and non-pharmacologic comfort measures Outcome: Progressing   Problem: Health Behavior/Discharge Planning: Goal: Ability to manage health-related needs will improve Outcome: Progressing   Problem: Clinical Measurements: Goal: Ability to maintain clinical measurements within normal limits will improve Outcome: Progressing Goal: Will remain free from infection Outcome: Progressing Goal: Diagnostic test results will improve Outcome: Progressing Goal: Respiratory complications will improve Outcome: Progressing Goal: Cardiovascular complication will be avoided Outcome: Progressing   Problem: Activity: Goal: Risk for activity intolerance will decrease Outcome: Progressing   Problem: Nutrition: Goal: Adequate nutrition will be maintained Outcome: Progressing   Problem: Coping: Goal: Level of anxiety will decrease Outcome: Progressing   Problem: Elimination: Goal: Will not experience complications related to bowel motility Outcome: Progressing Goal: Will not experience complications related to urinary retention Outcome: Progressing   Problem: Pain Managment: Goal: General experience of comfort will improve and/or be controlled Outcome: Progressing   Problem: Safety: Goal: Ability to remain free from injury will improve Outcome: Progressing   Problem: Skin Integrity: Goal: Risk for impaired skin integrity will decrease Outcome: Progressing   Problem: Education: Goal: Ability to describe self-care measures that may prevent or decrease complications (Diabetes Survival Skills Education) will improve Outcome: Progressing Goal: Individualized Educational Video(s) Outcome: Progressing   Problem: Coping: Goal: Ability to adjust to condition or change in health will improve Outcome: Progressing

## 2023-12-25 NOTE — Progress Notes (Signed)
 Occupational Therapy Treatment Patient Details Name: [name] MRN: [id number] DOB: 11-Oct-1940 Today's Date: 12/25/2023   History of present illness Pt is an 83 y/o female admitted for L4-5 laminectomy on 10/20 in setting of neurogenic claudication and severe spinal stenosis at L4-5 levels. She then was dc home and was seen in OP setting and was recommended to come back to the hospital due increase in pain and not able to complete ADLS. PMH: DM1, GERD, HTN, sleep apnea, vertigo   OT comments  Pt present in bed and agreeable to session. At this time voicing still concern about being about how she would be able to get food and make meals at home. She wanted to work on cleaning up at this time. Pt needed mod cues for sequencing for log rolling with increase in time and CGA. She then was able to ambulate to the bathroom with RW and close CGA and cues on sequencing to position walker to toilet. She demonstrated follow through with use of reacher with LB dressing when doffing and donning new adult pull up brief but when standing needs cues on hand position while donning with close Cga with RW and grab bar. She then completed ADLS while standing at the sink with 4 x LOB but was able to stable self with UE support. She was educated if alone would recommendation to complete in sitting with UE ADLs.   At this time recommendation for continued inpatient follow up therapy, <3 hours/day as pt at a high risk of falling and is unclear on how she would be able to get/out of her home, get meals and medications as she lives alone and reports no friends/family who can support her.       If plan is discharge home, recommend the following:  A little help with walking and/or transfers;A little help with bathing/dressing/bathroom;Assistance with cooking/housework;Assist for transportation;Help with stairs or ramp for entrance;Supervision due to cognitive status   Equipment Recommendations  BSC/3in1     Recommendations for Other Services      Precautions / Restrictions Precautions Precautions: Fall;Back Precaution Booklet Issued: Yes (comment) Precaution/Restrictions Comments: pt needs cues on how to complete tasks with precuations Restrictions Weight Bearing Restrictions Per Provider Order: No       Mobility Bed Mobility Overal bed mobility: Needs Assistance Bed Mobility: Rolling, Sidelying to Sit, Supine to Sit Rolling: Supervision Sidelying to sit: Supervision Supine to sit: Contact guard     General bed mobility comments: pt needed increase in efforts today    Transfers Overall transfer level: Needs assistance Equipment used: Rolling walker (2 wheels) Transfers: Sit to/from Stand Sit to Stand: Contact guard assist                 Balance Overall balance assessment: Needs assistance Sitting-balance support: Feet supported Sitting balance-Leahy Scale: Good     Standing balance support: Bilateral upper extremity supported, No upper extremity supported, Single extremity supported Standing balance-Leahy Scale: Fair Standing balance comment: pt does better with BUE support as will will start to sway and has increase in awaness but placing at a higher risk of falls at home being alone                           ADL either performed or assessed with clinical judgement   ADL Overall ADL's : Needs assistance/impaired Eating/Feeding: Independent   Grooming: Wash/dry hands;Wash/dry face;Applying deodorant;Oral care;Contact guard assist;Standing Grooming Details (indicate cue type and reason):  Pt when completion needed to reach several times to balance self as would start to posterior lean Upper Body Bathing: Contact guard assist;Standing   Lower Body Bathing: Contact guard assist;Sit to/from stand;Cueing for safety;Cueing for sequencing;With adaptive equipment Lower Body Bathing Details (indicate cue type and reason): used reacher Upper Body Dressing :  Contact guard assist;Standing   Lower Body Dressing: Contact guard assist;Sit to/from stand   Toilet Transfer: Contact guard assist;Supervision/safety;Rolling walker (2 wheels)   Toileting- Clothing Manipulation and Hygiene: Contact guard assist;Sit to/from stand       Functional mobility during ADLs: Contact guard assist;Supervision/safety;Rolling walker (2 wheels);Cueing for sequencing;Cueing for safety      Extremity/Trunk Assessment Upper Extremity Assessment Upper Extremity Assessment: Overall WFL for tasks assessed   Lower Extremity Assessment Lower Extremity Assessment: Defer to PT evaluation        Vision   Vision Assessment?: No apparent visual deficits   Perception Perception Perception: Within Functional Limits   Praxis Praxis Praxis: WFL   Communication Communication Communication: No apparent difficulties   Cognition Arousal: Alert Behavior During Therapy: WFL for tasks assessed/performed Cognition: No family/caregiver present to determine baseline             OT - Cognition Comments: pt needs repeated session for new learning noted to make progress                 Following commands: Intact        Cueing   Cueing Techniques: Verbal cues, Gestural cues  Exercises      Shoulder Instructions       General Comments pt's nurse made aware of pt may have scratched [place] site    Pertinent Vitals/ Pain       Pain Assessment Pain Assessment: Faces Faces Pain Scale: Hurts little more Pain Location: pt reported more discomfort with bed mobility compared to the day prior Pain Descriptors / Indicators: Sore, Aching Pain Intervention(s): Monitored during session  Home Living                                          Prior Functioning/Environment              Frequency  Min 2X/week        Progress Toward Goals  OT Goals(current goals can now be found in the care plan section)  Progress towards OT goals:  Progressing toward goals  Acute Rehab OT Goals Patient Stated Goal: to get cleaned up OT Goal Formulation: With patient Time For Goal Achievement: 12/31/23 Potential to Achieve Goals: Good ADL Goals Pt Will Perform Grooming: with modified independence;sitting Pt Will Perform Upper Body Bathing: with modified independence;sitting Pt Will Perform Lower Body Bathing: with modified independence;with adaptive equipment;sit to/from stand Pt Will Perform Upper Body Dressing: with modified independence;sitting Pt Will Perform Lower Body Dressing: with modified independence;sit to/from stand Pt Will Transfer to Toilet: with supervision;ambulating  Plan      Co-evaluation                 AM-PAC OT 6 Clicks Daily Activity     Outcome Measure   Help from another person eating meals?: None Help from another person taking care of personal grooming?: A Little Help from another person toileting, which includes using toliet, bedpan, or urinal?: A Little Help from another person bathing (including washing, rinsing, drying)?: A Little Help from another person  to put on and taking off regular upper body clothing?: A Little Help from another person to put on and taking off regular lower body clothing?: A Little 6 Click Score: 19    End of Session Equipment Utilized During Treatment: Gait belt;Rolling walker (2 wheels)  OT Visit Diagnosis: Pain;Other symptoms and signs involving cognitive function;Other abnormalities of gait and mobility (R26.89);Unsteadiness on feet (R26.81) Pain - part of body:  (back)   Activity Tolerance Patient tolerated treatment well   Patient Left in chair;with call bell/phone within reach;with chair alarm set   Nurse Communication Mobility status        Time: 9152-9087 OT Time Calculation (min): 25 min  Charges: OT General Charges $OT Visit: 1 Visit OT Treatments $Self Care/Home Management : 23-37 mins  Warrick POUR OTR/L  Acute Rehab Services   [phone] office number   Warrick Berber 12/25/2023, 9:21 AM

## 2023-12-25 NOTE — TOC Progression Note (Addendum)
 Transition of Care [facility]) - Progression Note    Patient Details  Name: [name] MRN: [id number] Date of Birth: 1940-10-14  [facility]) CM/SW Contact  Roxie KANDICE Stain, RN Phone Number: 12/25/2023, 4:22 PM  Clinical Narrative:    Spoke to patient and daughter, Olivia, and patient regarding transition needs.   Patient lives alone and at baseline is independent with her ADLs. Patient is using walker post surgery.   Patient is agreeable to home health. Amy with [facility] accepted referral. Need home health PT, OT , RN orders  Patient has friend to assist with grocery shopping and transportation to apts.  PT is recommending hospital bed, PTAR transportation, patient declines.   Reached out to Dr. Gwenevere office requesting discharge orders and home health orders.  Expected Discharge Plan: Home w Home Health Services Barriers to Discharge: Other (must enter comment) (SNF Appeal)               Expected Discharge Plan and Services In-house Referral: Clinical Social Work Discharge Planning Services: CM Consult Post Acute Care Choice: Home Health Living arrangements for the past 2 months: Single Family Home Expected Discharge Date: 12/24/23                         HH Arranged: PT, OT HH Agency: [facility] Date HH Agency Contacted: 12/23/23 Time HH Agency Contacted: 1348 Representative spoke with at [facility] Agency: Amy   Social Drivers of Health (SDOH) Interventions SDOH Screenings   Food Insecurity: No Food Insecurity (12/17/2023)  Housing: Low Risk  (12/17/2023)  Transportation Needs: No Transportation Needs (12/17/2023)  Utilities: Not At Risk (12/17/2023)  Social Connections: Moderately Integrated (12/17/2023)  Tobacco Use: Low Risk  (12/05/2023)    Readmission Risk Interventions     No data to display

## 2023-12-26 ENCOUNTER — Other Ambulatory Visit (HOSPITAL_COMMUNITY): Payer: Self-pay

## 2023-12-26 ENCOUNTER — Other Ambulatory Visit: Payer: Self-pay

## 2023-12-26 DIAGNOSIS — E109 Type 1 diabetes mellitus without complications: Secondary | ICD-10-CM | POA: Diagnosis not present

## 2023-12-26 DIAGNOSIS — M4800 Spinal stenosis, site unspecified: Secondary | ICD-10-CM | POA: Diagnosis not present

## 2023-12-26 DIAGNOSIS — Z79899 Other long term (current) drug therapy: Secondary | ICD-10-CM | POA: Diagnosis not present

## 2023-12-26 DIAGNOSIS — Z7982 Long term (current) use of aspirin: Secondary | ICD-10-CM | POA: Diagnosis not present

## 2023-12-26 DIAGNOSIS — J45909 Unspecified asthma, uncomplicated: Secondary | ICD-10-CM | POA: Diagnosis not present

## 2023-12-26 DIAGNOSIS — I10 Essential (primary) hypertension: Secondary | ICD-10-CM | POA: Diagnosis not present

## 2023-12-26 LAB — GLUCOSE, CAPILLARY: Glucose-Capillary: 128 mg/dL — ABNORMAL HIGH (ref 70–99)

## 2023-12-26 NOTE — Progress Notes (Signed)
 Patient ID: [name], female   DOB: 1941-02-02, 83 y.o.   MRN: [id number]   LOS: 0 days   Subjective: Doing well. Continues to c/o lateral thigh pain but denies N/T. Wants to go home.   Objective: Vital signs in last 24 hours: Temp:  [98.3 F (36.8 C)-98.7 F (37.1 C)] 98.3 F (36.8 C) (11/07 0748) Pulse Rate:  [55-70] 55 (11/07 0748) Resp:  [16] 16 (11/07 0748) BP: (153-159)/(53-72) 155/72 (11/07 0748) SpO2:  [92 %-99 %] 99 % (11/07 0748) Last BM Date : 12/22/23     Physical Exam General appearance: alert and no distress BLE No traumatic wounds, ecchymosis, or rash  Nontender  No knee or ankle effusion  Knee stable to varus/ valgus and anterior/posterior stress  Sens DPN, SPN, TN intact  Motor EHL, ext, flex, evers 5/5  DP 2+, PT 1+, No significant edema    Assessment/Plan: S/p lumbar laminectomy -- Doing well enough to discharge home. Pt is more desirous of this than going the SNF appeal route.    Stefanie DOROTHA Ned, PA-C Orthopedic Surgery [phone] 12/26/2023

## 2023-12-26 NOTE — Plan of Care (Signed)
  Problem: Health Behavior/Discharge Planning: Goal: Ability to manage health-related needs will improve Outcome: Adequate for Discharge   Problem: Safety: Goal: Ability to remain free from injury will improve Outcome: Adequate for Discharge   Problem: Education: Goal: Ability to describe self-care measures that may prevent or decrease complications (Diabetes Survival Skills Education) will improve Outcome: Adequate for Discharge

## 2023-12-26 NOTE — Progress Notes (Signed)
 Physical Therapy Treatment Patient Details Name: [name] MRN: [id number] DOB: [birth year] Today's Date: 12/26/2023   History of Present Illness Pt is an 83 y/o female admitted for L4-5 laminectomy on 10/20 in setting of neurogenic claudication and severe spinal stenosis at L4-5 levels. She then was dc home and was seen in OP setting and was recommended to come back to the hospital due increase in pain and not able to complete ADLS. PMH: DM1, GERD, HTN, sleep apnea, vertigo    PT Comments  Eager to return home. She now states that she has a rail on the right side to enter her home (different from info she provided during evaluation.) She also reports that she can stay downstairs, avoiding inside steps for a while. With this information updated, pt was able to navigate 6 steps with single rail on Rt bil hands on rail at a CGA level. CGA for gait with minor buckling but self corrects with cues for proper AD use. SNF still ideal for optimal recovery and safety but pt states she can also have a neighbor supervise getting in and out of her home at d/c. Would maximize [facility] services if d/c home. Patient will continue to benefit from skilled physical therapy services to further improve independence with functional mobility.    If plan is discharge home, recommend the following: A little help with walking and/or transfers;A little help with bathing/dressing/bathroom;Assistance with cooking/housework;Assist for transportation;Help with stairs or ramp for entrance   Can travel by private vehicle     Yes  Equipment Recommendations  BSC/3in1;Hospital bed;Other (comment) (Prior entry: would like a ramp (possibly a temporary ramp) for steps to front door; no rails at steps to front door, consider non-emergent ambulance transport home or arranging for solid assistance ascending those steps (that don't have rails))    Recommendations for Other Services       Precautions / Restrictions  Precautions Precautions: Fall;Back Precaution Booklet Issued: Yes (comment) Recall of Precautions/Restrictions: Impaired Precaution/Restrictions Comments: Cues to avoid twisting Restrictions Weight Bearing Restrictions Per Provider Order: No     Mobility  Bed Mobility               General bed mobility comments: In recliner    Transfers Overall transfer level: Needs assistance Equipment used: Rolling walker (2 wheels) Transfers: Sit to/from Stand Sit to Stand: Supervision           General transfer comment: Supervision for safety, cues for hand placement to rise from recliner and standard chair. RW to steady.    Ambulation/Gait Ambulation/Gait assistance: Contact guard assist Gait Distance (Feet): 100 Feet Assistive device: Rolling walker (2 wheels) Gait Pattern/deviations: Step-through pattern, Antalgic, Knees buckling Gait velocity: decreased Gait velocity interpretation: <1.31 ft/sec, indicative of household ambulator   General Gait Details: Reviewed safe AD use with RW to maximize support due to weakness of RLE. Minor buckling noted but self corrects. Required 2 standing rest breaks to complete 100' distance with CGA for safety.   Stairs Stairs: Yes Stairs assistance: Contact guard assist Stair Management: One rail Right, Step to pattern, Sideways Number of Stairs: 6 General stair comments: Able to complete stair training with bil hands on rail, sideways approaching, leading up with LLE (same set-up as home entrance per pt.) CGA for safety. No buckling but reliant on rail. States she has 2 walkers, can leave one at the bottom of steps, and one at entrance to home. Also states a neighbor can supervise entrance/exit.   Wheelchair Mobility  Tilt Bed    Modified Rankin (Stroke Patients Only)       Balance Overall balance assessment: Needs assistance Sitting-balance support: Feet supported Sitting balance-Leahy Scale: Good     Standing balance  support: No upper extremity supported, Single extremity supported Standing balance-Leahy Scale: Fair Standing balance comment: Stands at sink for hygiene no Ue support.                            Communication Communication Communication: No apparent difficulties  Cognition Arousal: Alert Behavior During Therapy: WFL for tasks assessed/performed   PT - Cognitive impairments: No family/caregiver present to determine baseline                         Following commands: Intact      Cueing Cueing Techniques: Verbal cues, Gestural cues  Exercises      General Comments General comments (skin integrity, edema, etc.): Educated on safety, awareness, precautions, and home set-up (will have someone arrange chairs in house to sit between rooms in the event her LEs fatigue and needs to sit.      Pertinent Vitals/Pain Pain Assessment Pain Assessment: Faces Faces Pain Scale: Hurts even more Pain Location: LEs with prolonged standing/gait Pain Descriptors / Indicators: Sore, Aching Pain Intervention(s): Limited activity within patient's tolerance, Monitored during session, Repositioned    Home Living                          Prior Function            PT Goals (current goals can now be found in the care plan section) Acute Rehab PT Goals Patient Stated Goal: Independent and safe at home PT Goal Formulation: With patient Time For Goal Achievement: 12/31/23 Potential to Achieve Goals: Good Progress towards PT goals: Progressing toward goals    Frequency    Min 2X/week      PT Plan      Co-evaluation              AM-PAC PT 6 Clicks Mobility   Outcome Measure  Help needed turning from your back to your side while in a flat bed without using bedrails?: A Little Help needed moving from lying on your back to sitting on the side of a flat bed without using bedrails?: A Little Help needed moving to and from a bed to a chair (including a  wheelchair)?: A Little Help needed standing up from a chair using your arms (e.g., wheelchair or bedside chair)?: A Little Help needed to walk in hospital room?: A Little Help needed climbing 3-5 steps with a railing? : A Little 6 Click Score: 18    End of Session Equipment Utilized During Treatment: Gait belt Activity Tolerance: Patient tolerated treatment well Patient left: in chair;with call bell/phone within reach;with chair alarm set Nurse Communication: Mobility status PT Visit Diagnosis: Unsteadiness on feet (R26.81);Pain Pain - Right/Left: Right Pain - part of body: Leg     Time: 0852-0916 PT Time Calculation (min) (ACUTE ONLY): 24 min  Charges:    $Gait Training: 8-22 mins $Therapeutic Activity: 8-22 mins PT General Charges $$ ACUTE PT VISIT: 1 Visit                     Leontine Roads, PT, DPT [facility]  Rehabilitation Services Physical Therapist Office: [phone] Website: [email or web address]    Leontine RAMAN  Ally Knodel 12/26/2023, 10:18 AM

## 2023-12-29 ENCOUNTER — Other Ambulatory Visit: Payer: Self-pay | Admitting: Cardiology

## 2023-12-29 ENCOUNTER — Other Ambulatory Visit: Payer: Self-pay

## 2023-12-30 ENCOUNTER — Other Ambulatory Visit (HOSPITAL_COMMUNITY): Payer: Self-pay

## 2023-12-30 ENCOUNTER — Other Ambulatory Visit: Payer: Self-pay

## 2023-12-30 DIAGNOSIS — Z794 Long term (current) use of insulin: Secondary | ICD-10-CM | POA: Diagnosis not present

## 2023-12-30 DIAGNOSIS — I501 Left ventricular failure: Secondary | ICD-10-CM | POA: Diagnosis not present

## 2023-12-30 DIAGNOSIS — J4489 Other specified chronic obstructive pulmonary disease: Secondary | ICD-10-CM | POA: Diagnosis not present

## 2023-12-30 DIAGNOSIS — N184 Chronic kidney disease, stage 4 (severe): Secondary | ICD-10-CM | POA: Diagnosis not present

## 2023-12-30 DIAGNOSIS — E1165 Type 2 diabetes mellitus with hyperglycemia: Secondary | ICD-10-CM | POA: Diagnosis not present

## 2023-12-30 DIAGNOSIS — I700 Atherosclerosis of aorta: Secondary | ICD-10-CM | POA: Diagnosis not present

## 2023-12-30 DIAGNOSIS — G309 Alzheimer's disease, unspecified: Secondary | ICD-10-CM | POA: Diagnosis not present

## 2023-12-30 DIAGNOSIS — I130 Hypertensive heart and chronic kidney disease with heart failure and stage 1 through stage 4 chronic kidney disease, or unspecified chronic kidney disease: Secondary | ICD-10-CM | POA: Diagnosis not present

## 2023-12-30 DIAGNOSIS — E1142 Type 2 diabetes mellitus with diabetic polyneuropathy: Secondary | ICD-10-CM | POA: Diagnosis not present

## 2023-12-31 ENCOUNTER — Other Ambulatory Visit (HOSPITAL_COMMUNITY): Payer: Self-pay

## 2023-12-31 ENCOUNTER — Other Ambulatory Visit: Payer: Self-pay

## 2023-12-31 MED ORDER — CARVEDILOL 6.25 MG PO TABS
6.2500 mg | ORAL_TABLET | Freq: Two times a day (BID) | ORAL | 3 refills | Status: AC
  Filled 2023-12-31: qty 180, 90d supply, fill #0
  Filled 2024-01-22: qty 60, 30d supply, fill #0
  Filled 2024-02-24: qty 60, 30d supply, fill #1
  Filled 2024-03-25: qty 60, 30d supply, fill #2

## 2023-12-31 MED ORDER — ATORVASTATIN CALCIUM 10 MG PO TABS
10.0000 mg | ORAL_TABLET | Freq: Every morning | ORAL | 3 refills | Status: AC
  Filled 2023-12-31: qty 90, 90d supply, fill #0
  Filled 2024-01-22 (×2): qty 30, 30d supply, fill #0
  Filled 2024-02-24: qty 30, 30d supply, fill #1
  Filled 2024-03-25: qty 30, 30d supply, fill #2

## 2024-01-01 ENCOUNTER — Other Ambulatory Visit (HOSPITAL_COMMUNITY): Payer: Self-pay

## 2024-01-01 NOTE — Progress Notes (Signed)
 POST DISCHARGE NOTE  12/31/23   1618  Iris, CMA reached out to this RNCM stating patient's daughter, Olivia, notified the office that home health physical therapy hasn't started yet but Nursing had come. Olivia was very upset . This RNCM called Amy with [facility] requesting office call the daughter @ [phone].  01/01/24 1128  Iris, CMA reached out to this RNCM again stating Olivia had called and stated again that no one with [facility] had reached out. This RNCM spoke to Amy with [facility] regarding no one calling daughter. Amy is in the office now and will have director call daughter today.

## 2024-01-01 NOTE — TOC Transition Note (Signed)
 Transition of Care [facility]) - Discharge Note   Patient Details  Name: [name] MRN: [id number] Date of Birth: 07-02-1940  [facility]) CM/SW Contact:  Roxie KANDICE Stain, RN Phone Number: 01/01/2024, 2:46 PM   Clinical Narrative:    Patient stable for discharge. Notified Amy with [facility] of discharge.  Patient's friend will transport home.    Final next level of care: Home w Home Health Services Barriers to Discharge: Barriers Resolved   Patient Goals and CMS Choice Patient states their goals for this hospitalization and ongoing recovery are:: to go home CMS Medicare.gov Compare Post Acute Care list provided to:: Patient Choice offered to / list presented to : Patient      Discharge Placement               home        Discharge Plan and Services Additional resources added to the After Visit Summary for   In-house Referral: Clinical Social Work Discharge Planning Services: CM Consult Post Acute Care Choice: Home Health                    HH Arranged: RN, PT, OT [facility] Agency: [facility] Date [facility] Agency Contacted: 12/26/23 Time HH Agency Contacted: [phone] Representative spoke with at [facility] Agency: Amy  Social Drivers of Health (SDOH) Interventions SDOH Screenings   Food Insecurity: No Food Insecurity (12/17/2023)  Housing: Low Risk  (12/17/2023)  Transportation Needs: No Transportation Needs (12/17/2023)  Utilities: Not At Risk (12/17/2023)  Social Connections: Moderately Integrated (12/17/2023)  Tobacco Use: Low Risk  (12/05/2023)     Readmission Risk Interventions     No data to display

## 2024-01-02 ENCOUNTER — Other Ambulatory Visit (HOSPITAL_COMMUNITY): Payer: Self-pay

## 2024-01-03 ENCOUNTER — Other Ambulatory Visit: Payer: Self-pay

## 2024-01-12 DIAGNOSIS — E1122 Type 2 diabetes mellitus with diabetic chronic kidney disease: Secondary | ICD-10-CM | POA: Diagnosis not present

## 2024-01-12 DIAGNOSIS — N184 Chronic kidney disease, stage 4 (severe): Secondary | ICD-10-CM | POA: Diagnosis not present

## 2024-01-12 DIAGNOSIS — M545 Low back pain, unspecified: Secondary | ICD-10-CM | POA: Diagnosis not present

## 2024-01-12 DIAGNOSIS — I10 Essential (primary) hypertension: Secondary | ICD-10-CM | POA: Diagnosis not present

## 2024-01-12 DIAGNOSIS — M150 Primary generalized (osteo)arthritis: Secondary | ICD-10-CM | POA: Diagnosis not present

## 2024-01-13 ENCOUNTER — Other Ambulatory Visit: Payer: Self-pay

## 2024-01-14 ENCOUNTER — Other Ambulatory Visit: Payer: Self-pay

## 2024-01-15 IMAGING — US US ABDOMEN LIMITED
1 series · 15 of 25 positions shown · non-contrast
Comparison: None.

CLINICAL DATA: Right upper quadrant pain for 2 weeks

EXAM:
ULTRASOUND ABDOMEN LIMITED RIGHT UPPER QUADRANT

[Series 1: us abdomen limited ruq mc & wl · 15 of 50 slices shown]
[im 1/50]
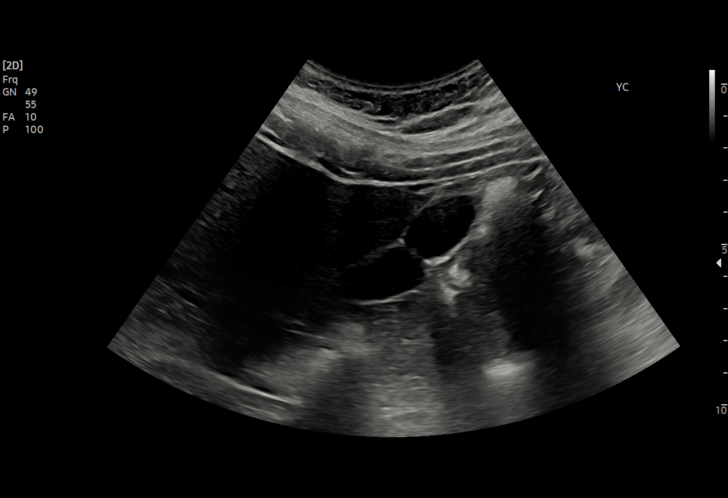
[im 5/50]
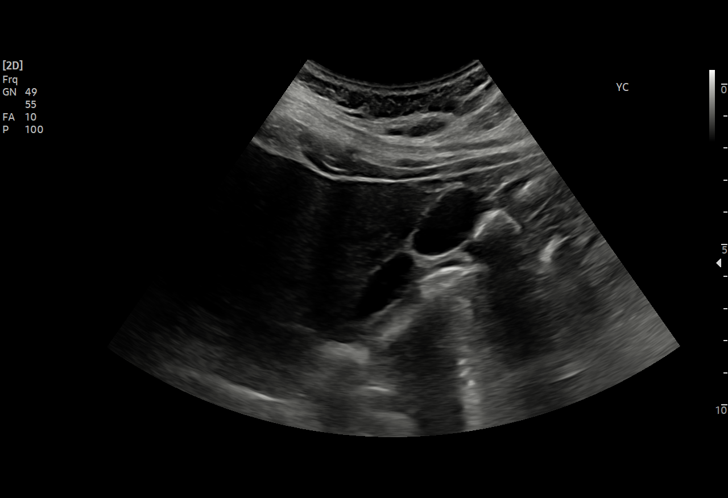
[im 9/50]
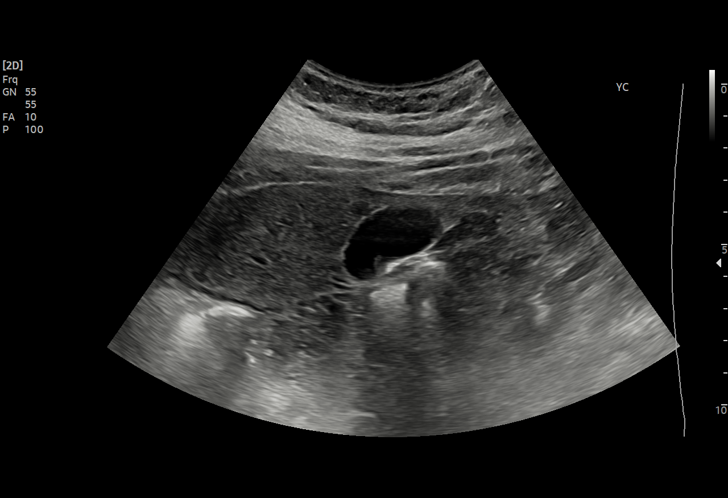
[im 11/50]
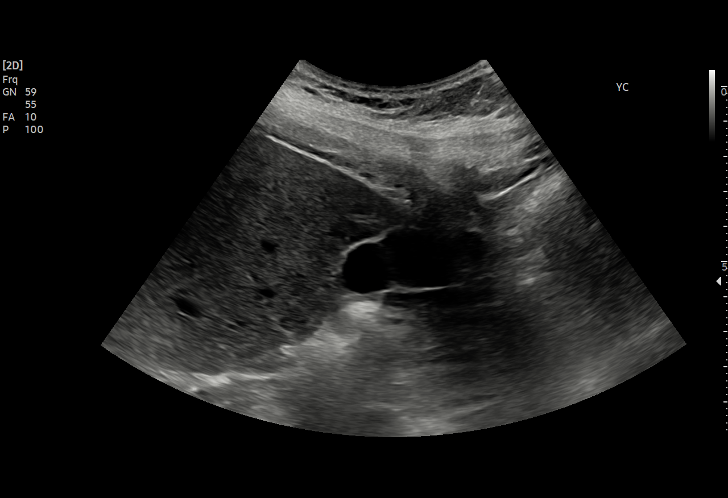
[im 15/50]
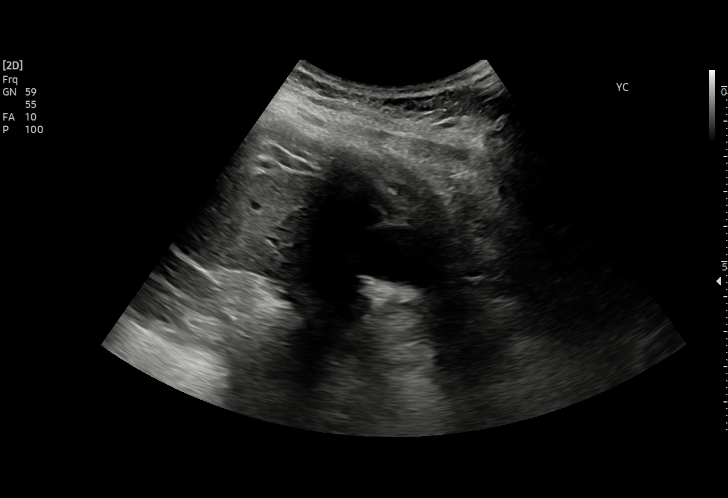
[im 19/50]
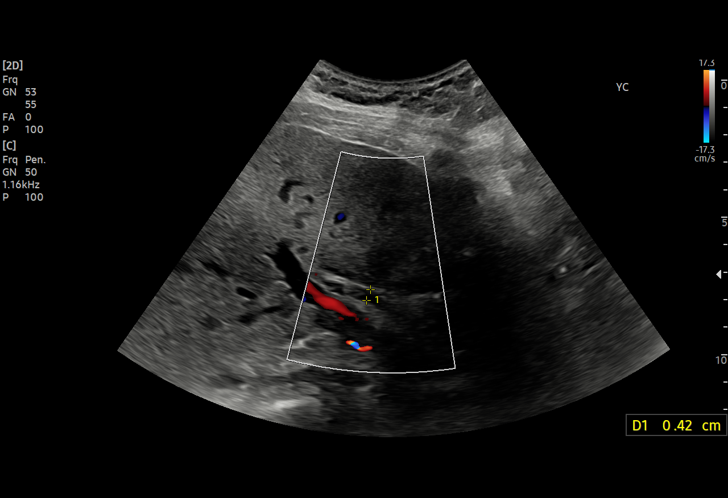
[im 21/50]
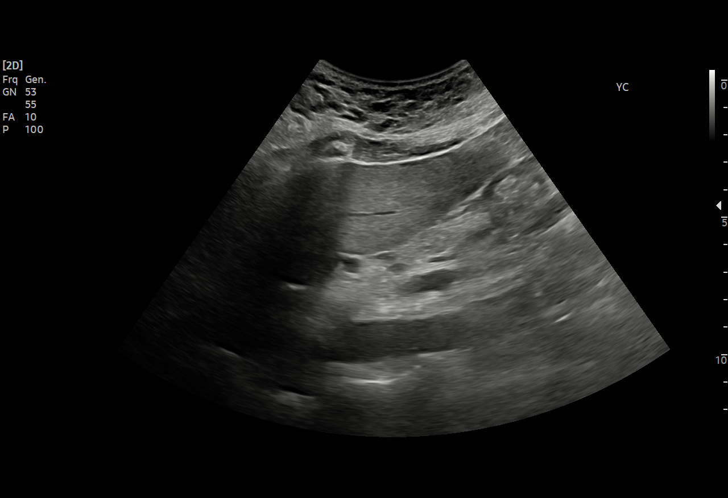
[im 25/50]
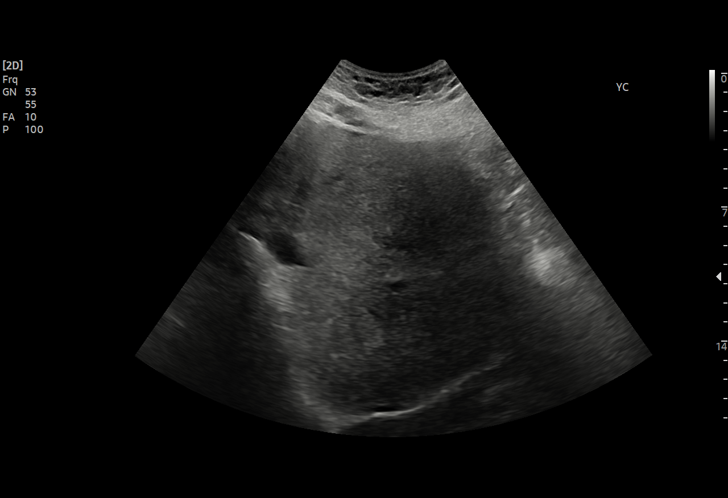
[im 29/50]
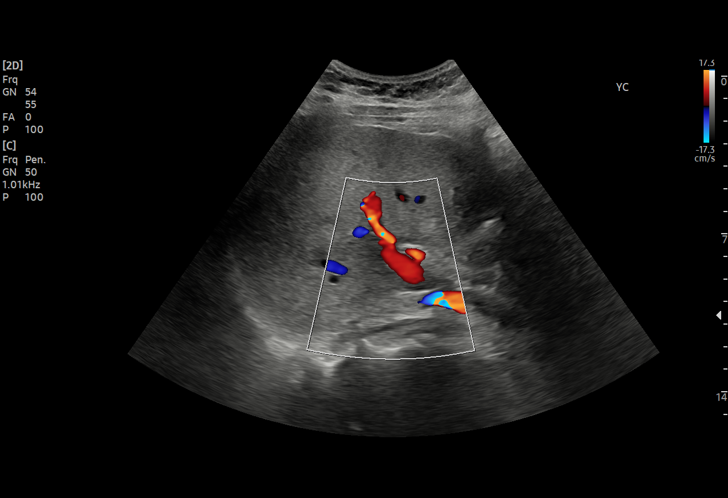
[im 31/50]
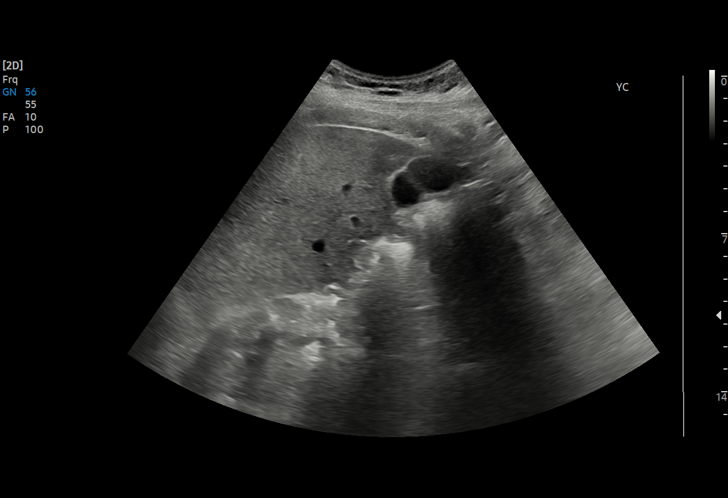
[im 35/50]
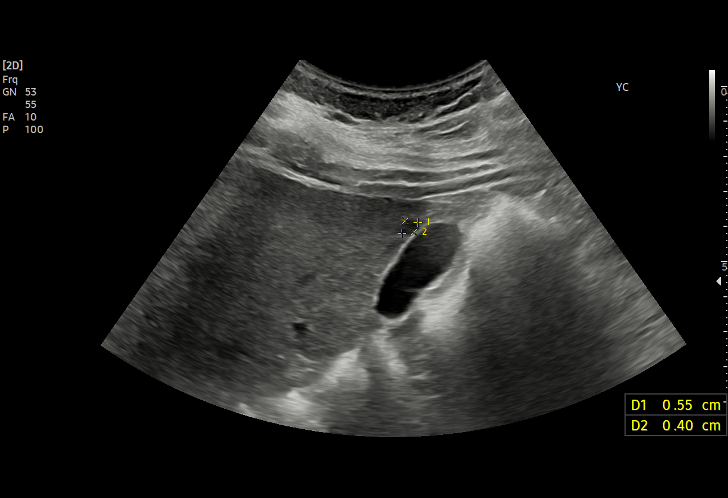
[im 39/50]
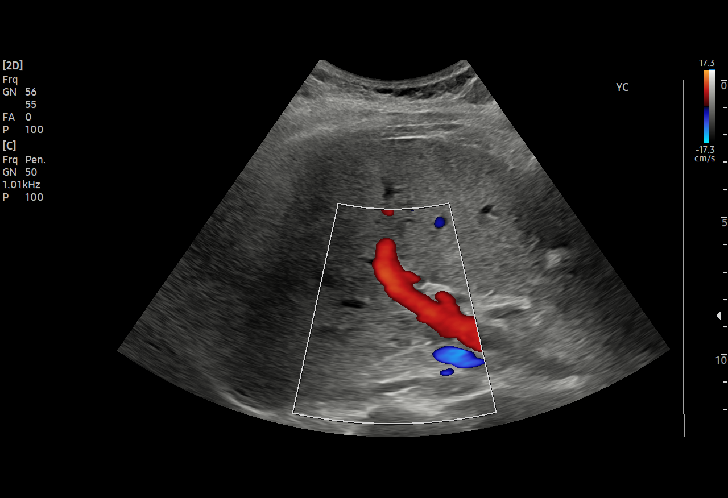
[im 41/50]
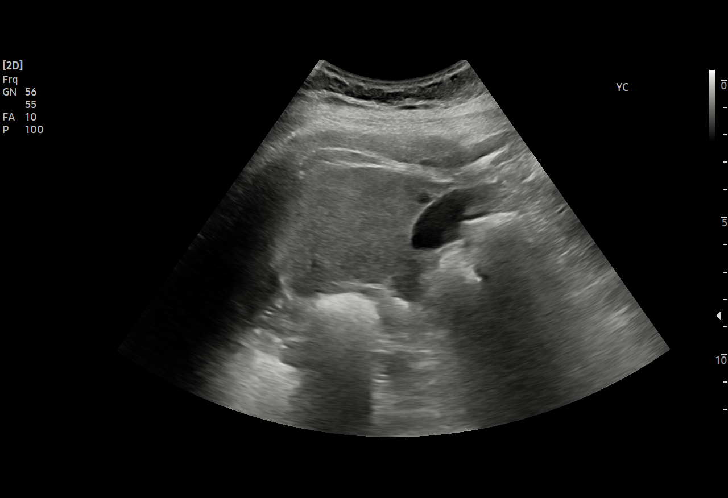
[im 45/50]
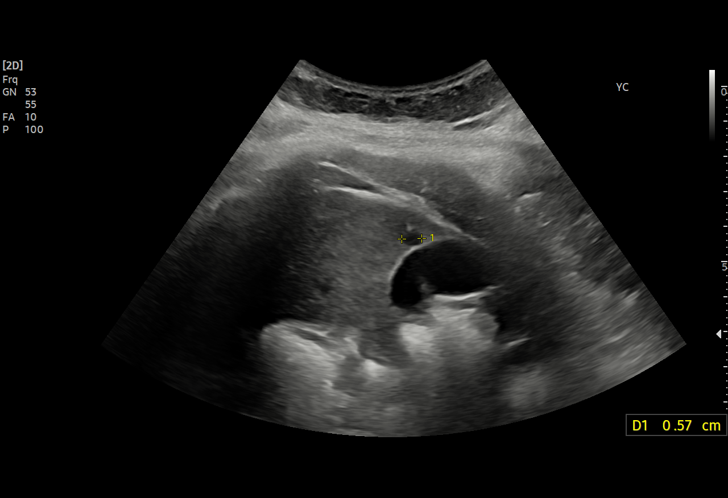
[im 50/50]
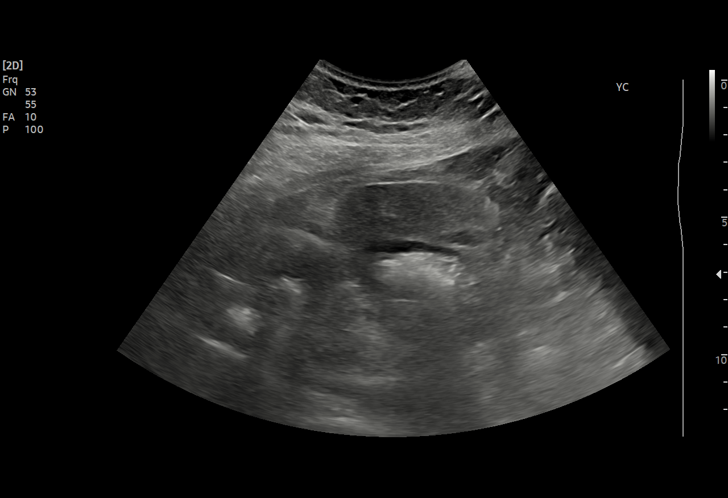

[15 of 25 positions shown; findings below may reference images not displayed]

FINDINGS: Gallbladder:

No gallstones or wall thickening visualized. No sonographic Murphy
sign noted by sonographer.

Common bile duct:

Diameter: 4.2 mm.

Liver:

Small 6 mm cyst is noted adjacent to the gallbladder fossa. No other
focal abnormality is noted. Portal vein is patent on color Doppler
imaging with normal direction of blood flow towards the liver.

Other: None.
IMPRESSION: Small hepatic cyst.  No other focal abnormality is noted.

## 2024-01-15 IMAGING — CR DG CHEST 2V
2 series · 2 of 2 positions shown · non-contrast
Comparison: August 27, 2020

CLINICAL DATA: Chest pain and nausea for 2 weeks.

EXAM:
CHEST - 2 VIEW

[w chest lat]
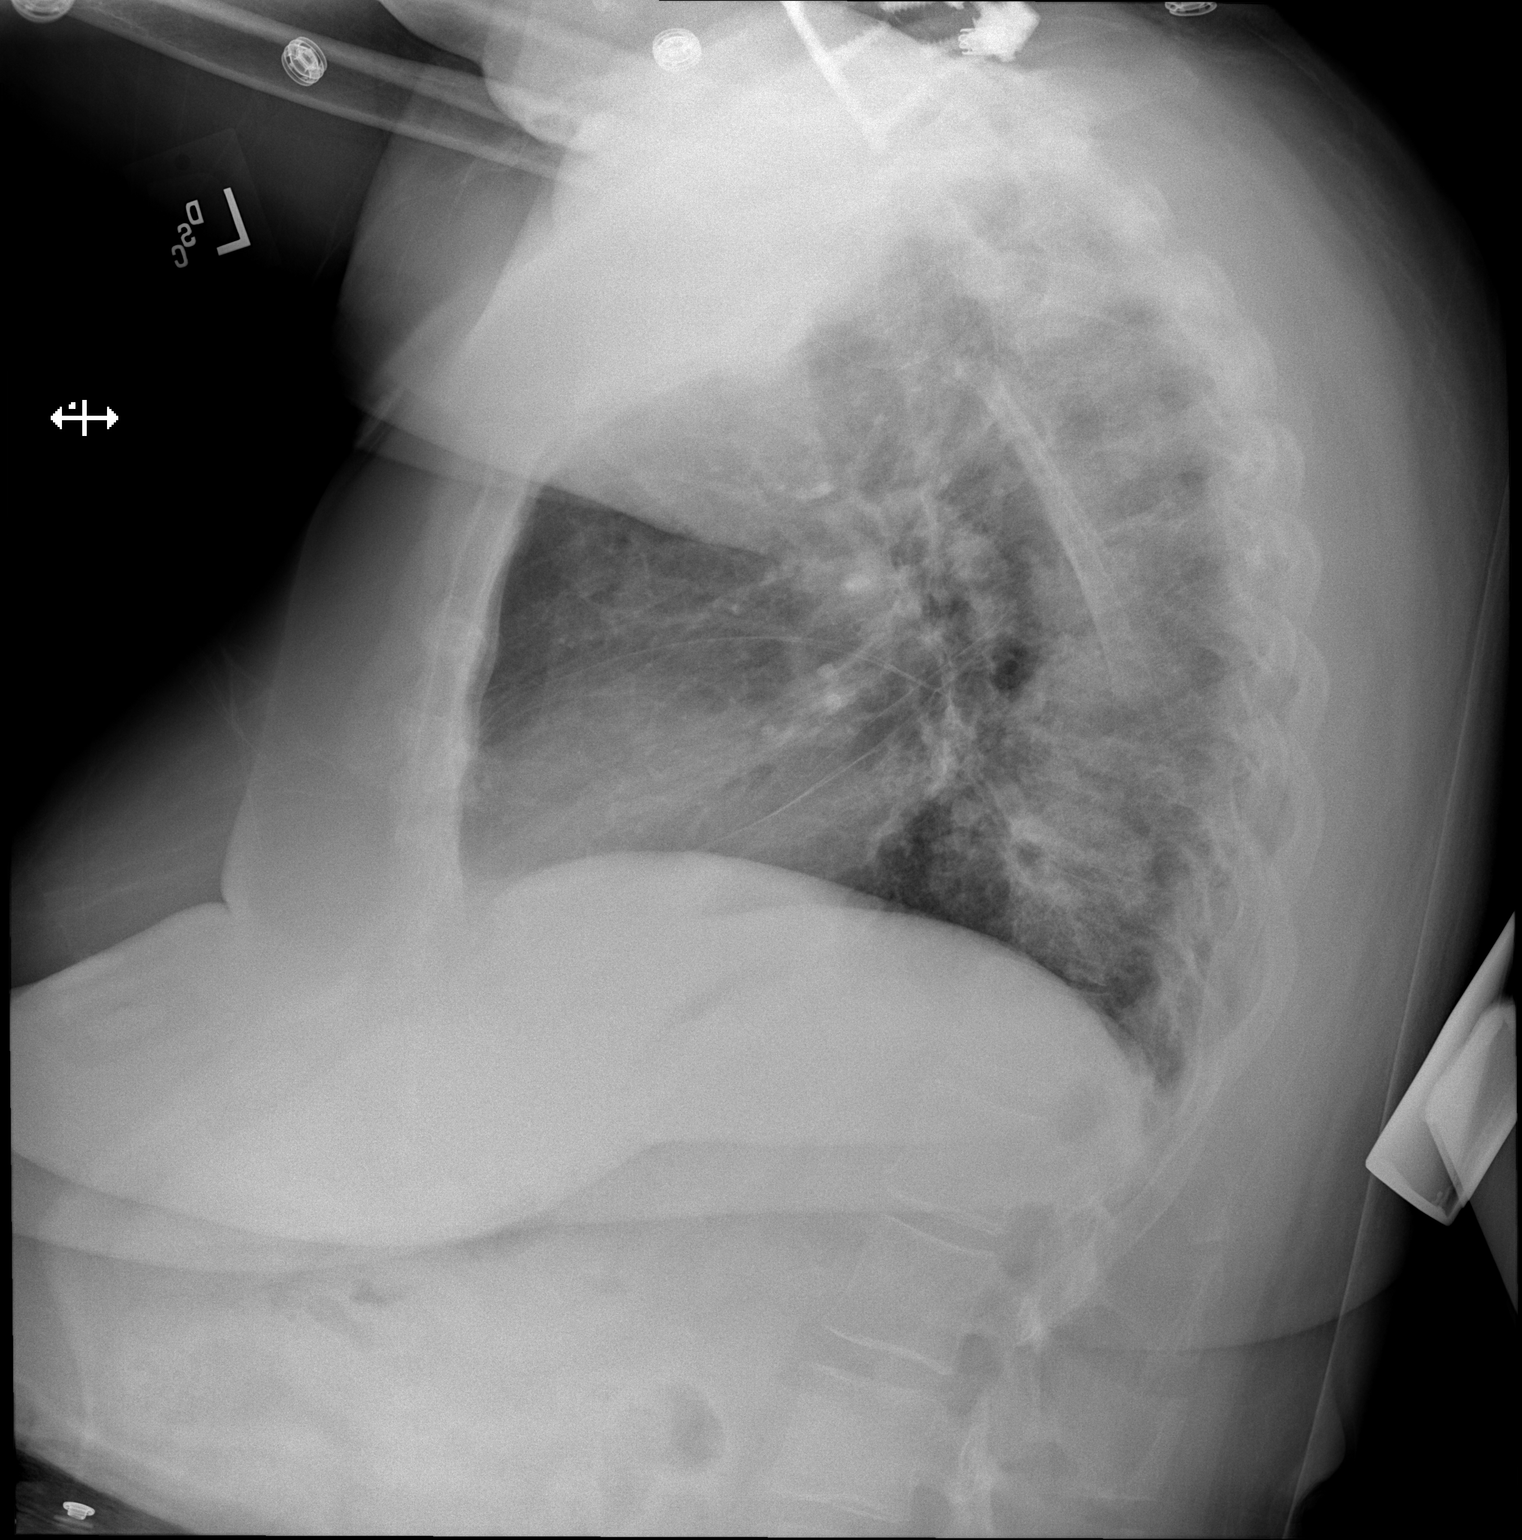

[x chest ap]
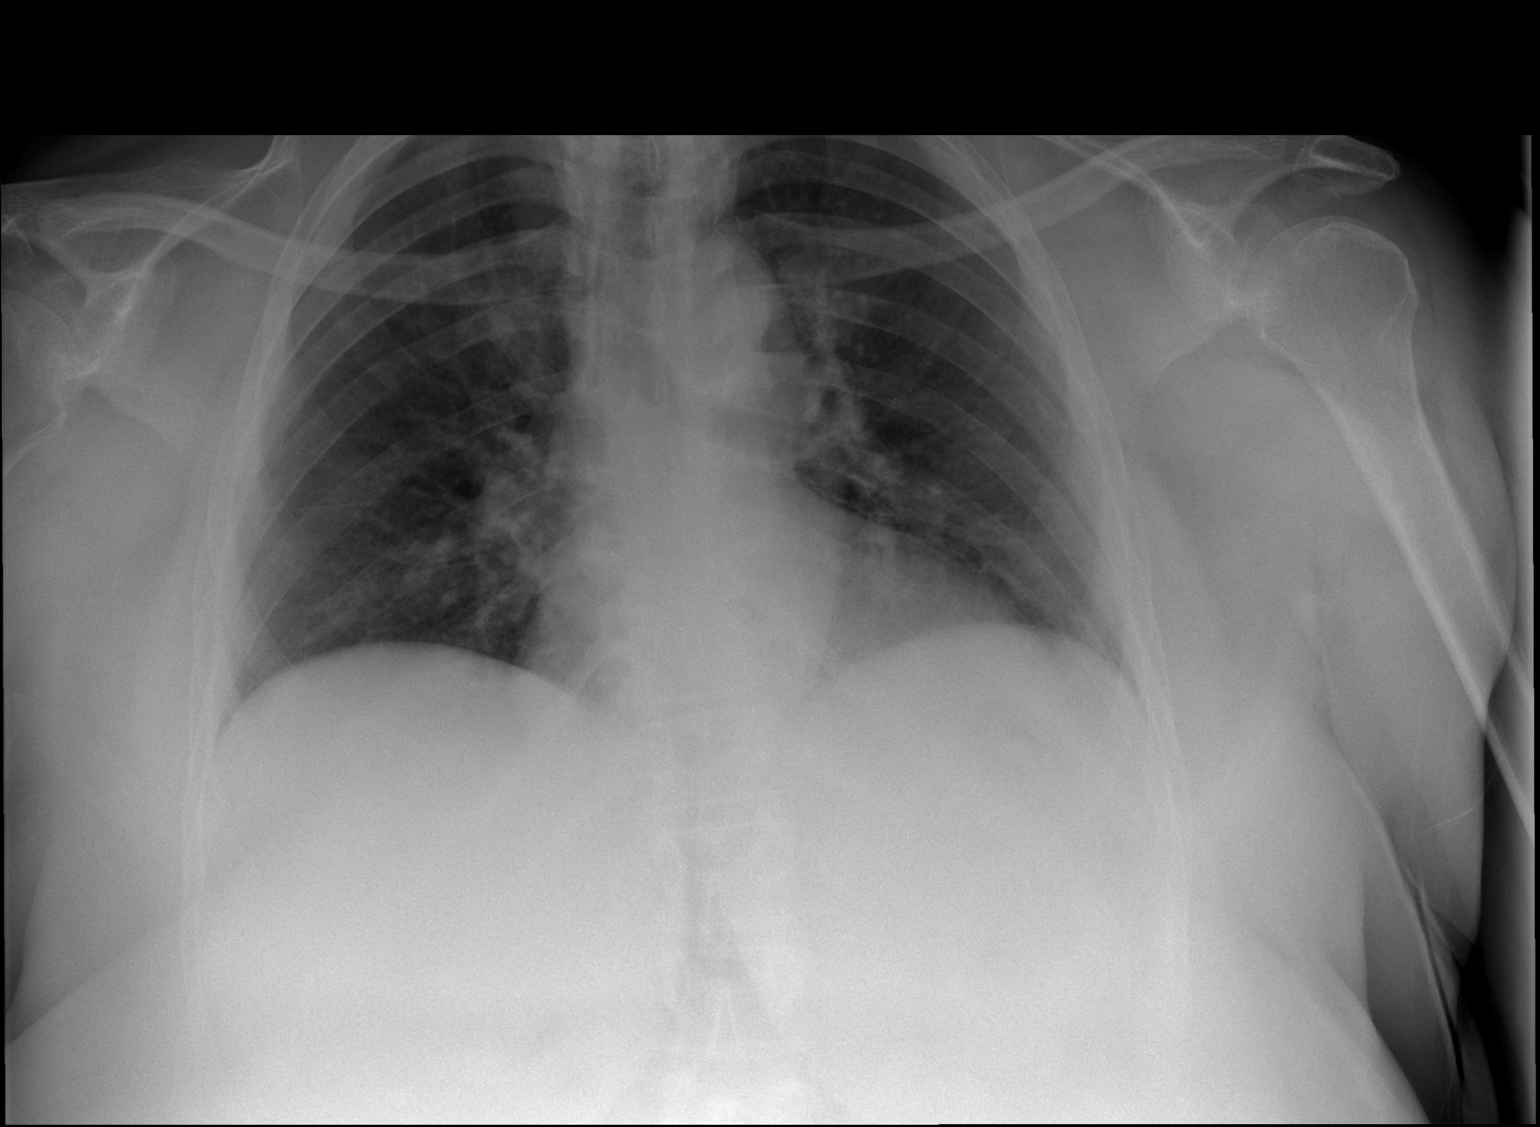

[2 of 2 positions shown; findings below may reference images not displayed]

FINDINGS: The heart size and mediastinal contours are within normal limits.
Both lungs are clear. The visualized skeletal structures are stable.
IMPRESSION: No active cardiopulmonary disease.

## 2024-01-15 IMAGING — CT CT ABD-PELV W/O CM
2 of 4 series · 16 of 46 positions shown, 18 images · non-contrast
Comparison: 02/17/2020.

CLINICAL DATA: Abdominal pain and blood in stool.



[Series 2: axial st · axial · 0.75mm/px · z∈[-322,+12]mm · 13 of 75 slices shown, 15 images]
[im 4/75  soft-tissue]
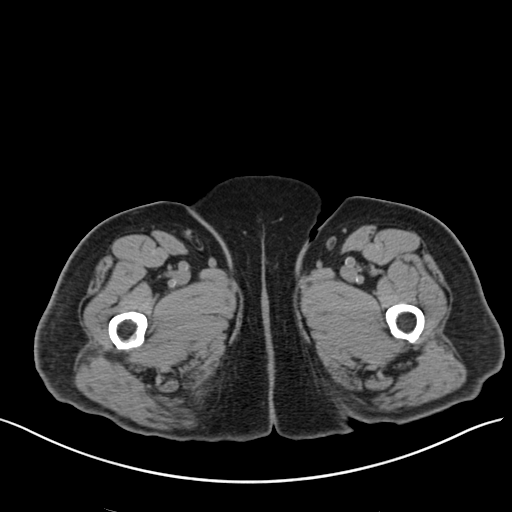
[im 4/75  bone]
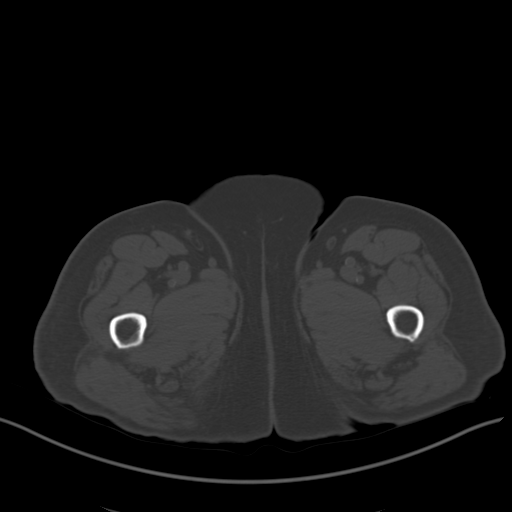
[im 11/75  soft-tissue]
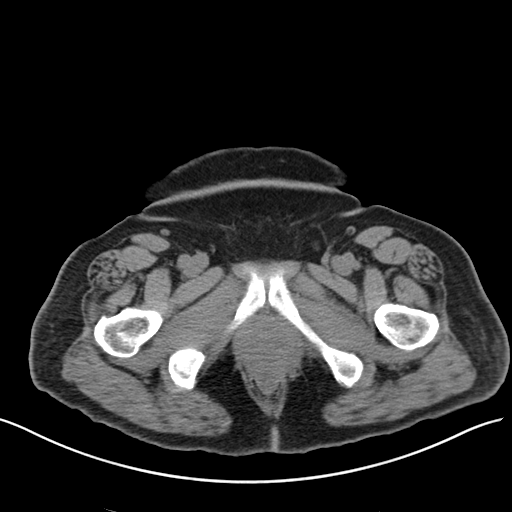
[im 15/75  soft-tissue]
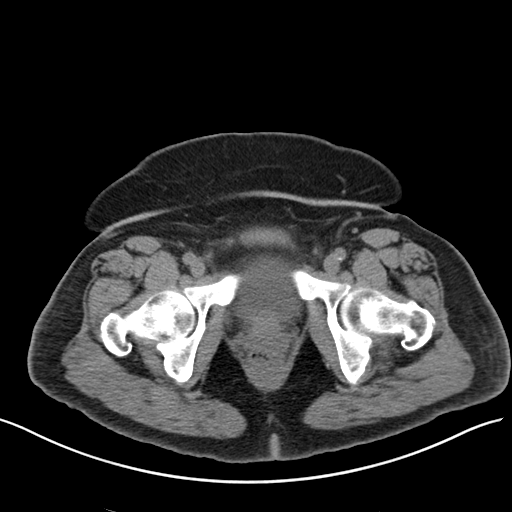
[im 22/75  soft-tissue]
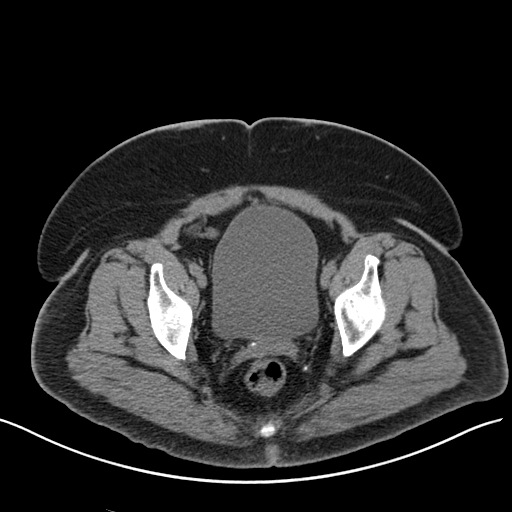
[im 25/75  soft-tissue]
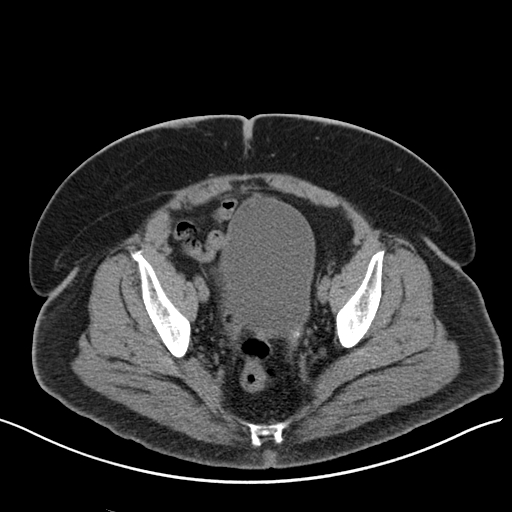
[im 32/75  soft-tissue]
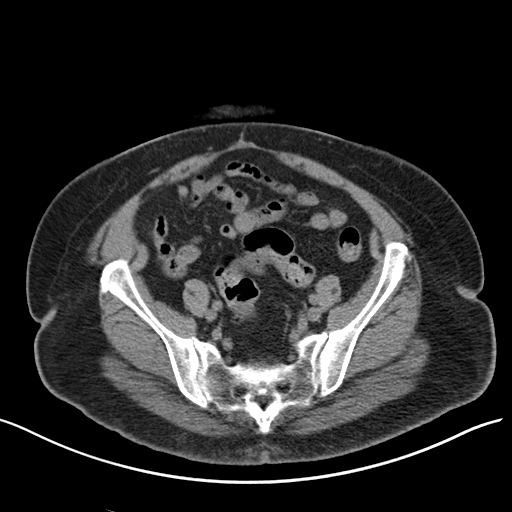
[im 39/75  soft-tissue]
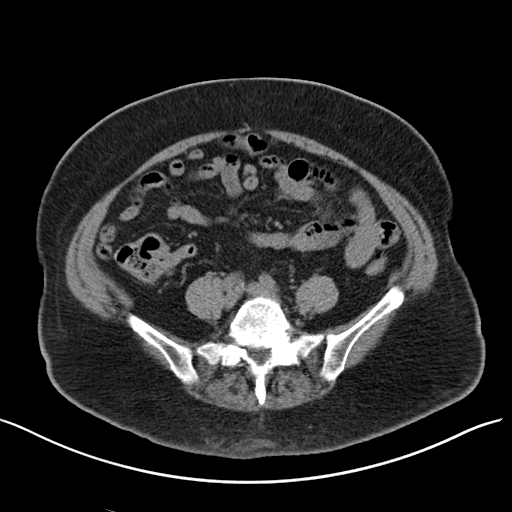
[im 43/75  soft-tissue]
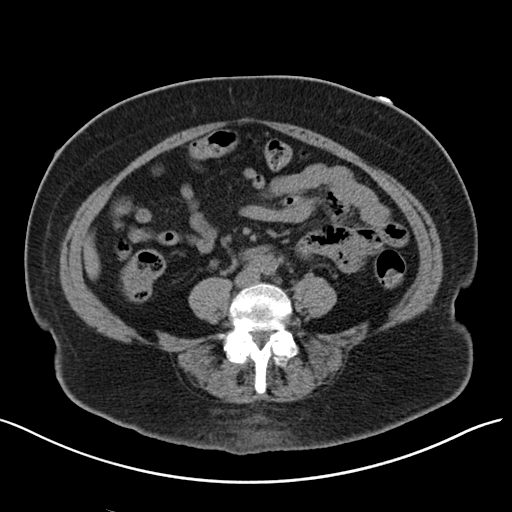
[im 50/75  soft-tissue]
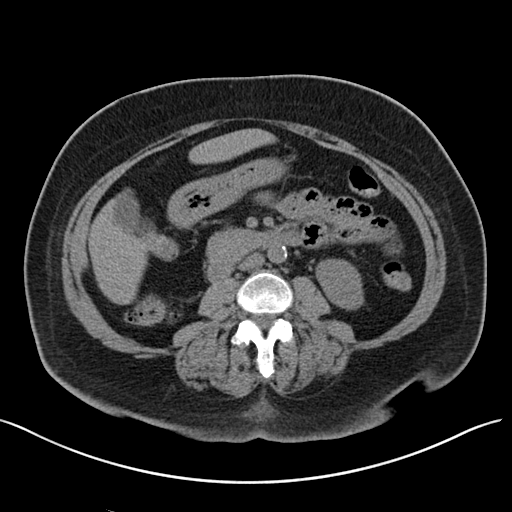
[im 50/75  bone]
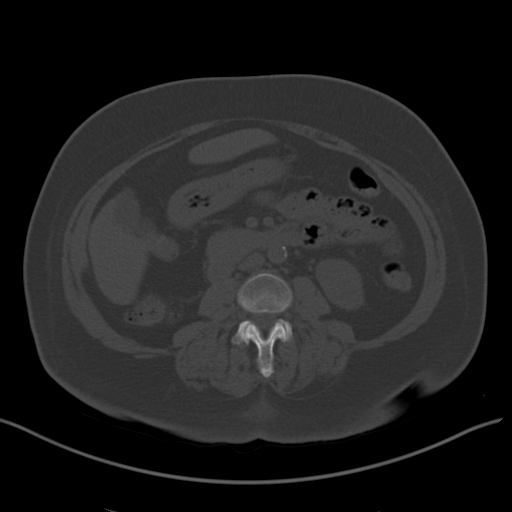
[im 53/75  soft-tissue]
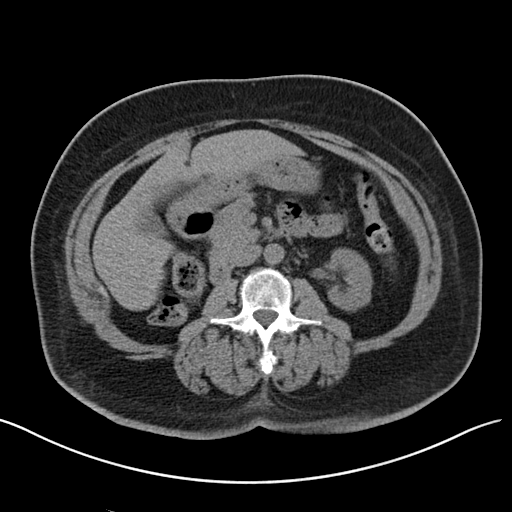
[im 60/75  soft-tissue]
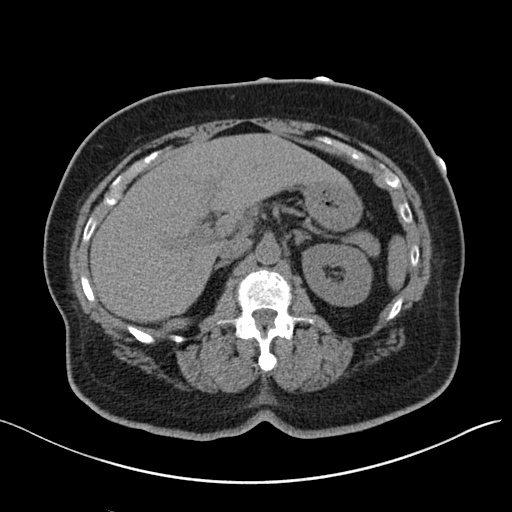
[im 64/75  soft-tissue]
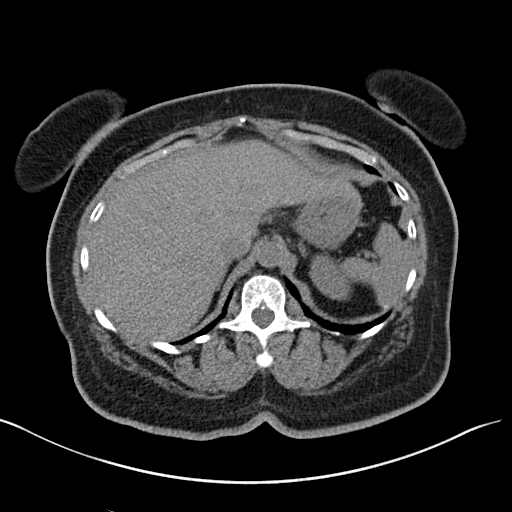
[im 71/75  soft-tissue]
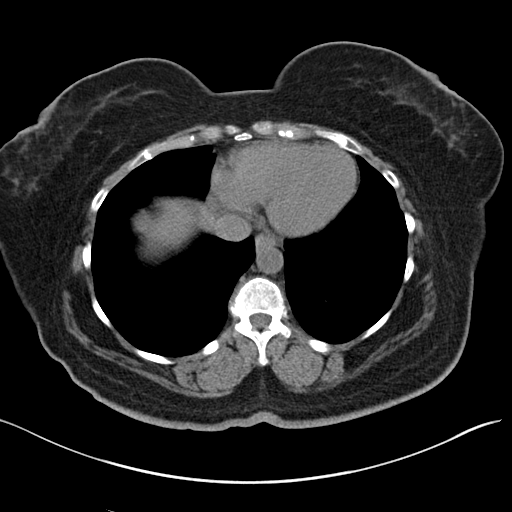

[Series 4: coronal st · coronal · 0.73mm/px · 3 of 154 slices shown]
[im 52/154  soft-tissue]
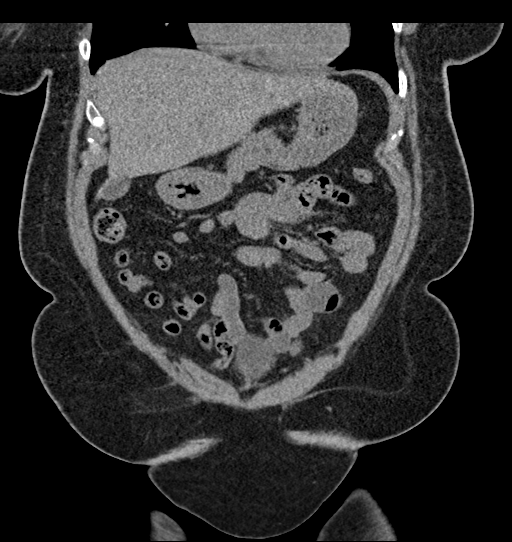
[im 69/154  soft-tissue]
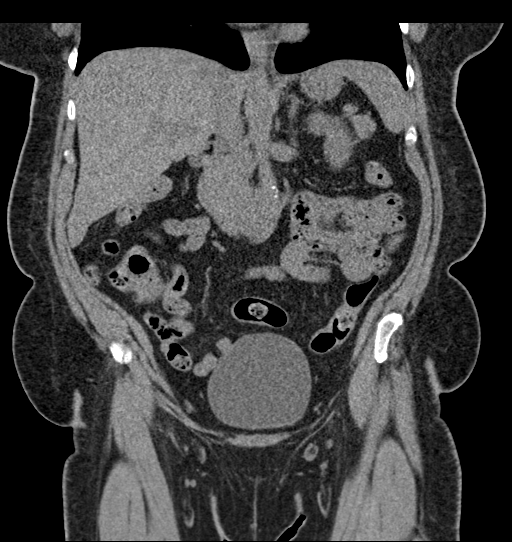
[im 86/154  soft-tissue]
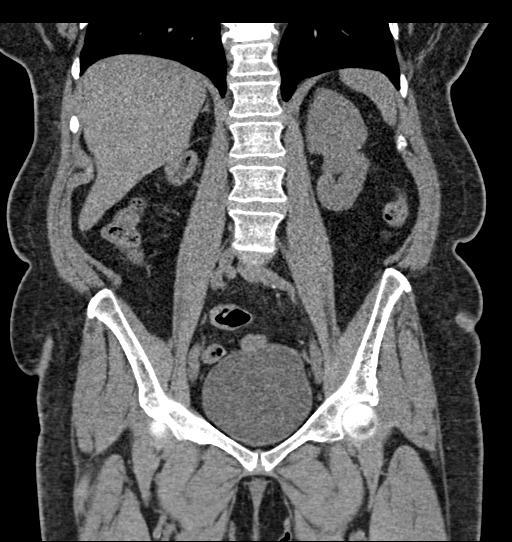

[16 of 46 positions shown; findings below may reference images not displayed]

FINDINGS: Lower chest: Minimal atelectasis or scarring at the lung bases.

Hepatobiliary: No focal liver abnormality is seen. No gallstones,
gallbladder wall thickening, or biliary dilatation.

Pancreas: Unremarkable. No pancreatic ductal dilatation or
surrounding inflammatory changes.

Spleen: Normal in size without focal abnormality.

Adrenals/Urinary Tract: The adrenal glands are within normal limits.
Right nephrectomy changes are noted. No renal or ureteral calculus
or obstructive uropathy on the left. The bladder is within normal
limits.

Stomach/Bowel: The stomach is within normal limits. No bowel
obstruction, free air or pneumatosis. A few scattered diverticula
are present along the colon without evidence of diverticulitis. No
focal bowel wall thickening. The appendix is not visualized on exam,
however no inflammatory changes are present in the right lower
quadrant

Vascular/Lymphatic: Aortic atherosclerosis. No enlarged abdominal or
pelvic lymph nodes.

Reproductive: Status post hysterectomy. No adnexal masses.

Other: Small fat containing umbilical hernia.  No free fluid.

Musculoskeletal: Degenerative changes are present in the
thoracolumbar spine. No acute osseous abnormality.
IMPRESSION: 1. No acute intra-abdominal process.
2. Diverticulosis without diverticulitis.
3. Status post right nephrectomy.
4. Aortic atherosclerosis.

## 2024-01-19 ENCOUNTER — Other Ambulatory Visit (HOSPITAL_COMMUNITY): Payer: Self-pay

## 2024-01-21 ENCOUNTER — Other Ambulatory Visit: Payer: Self-pay

## 2024-01-22 ENCOUNTER — Other Ambulatory Visit: Payer: Self-pay

## 2024-01-22 ENCOUNTER — Other Ambulatory Visit (HOSPITAL_COMMUNITY): Payer: Self-pay

## 2024-01-23 ENCOUNTER — Other Ambulatory Visit (HOSPITAL_COMMUNITY): Payer: Self-pay

## 2024-01-23 ENCOUNTER — Other Ambulatory Visit: Payer: Self-pay

## 2024-01-26 ENCOUNTER — Other Ambulatory Visit: Payer: Self-pay

## 2024-01-26 ENCOUNTER — Other Ambulatory Visit (HOSPITAL_COMMUNITY): Payer: Self-pay

## 2024-01-26 MED ORDER — DONEPEZIL HCL 10 MG PO TABS
10.0000 mg | ORAL_TABLET | Freq: Every day | ORAL | 1 refills | Status: AC
  Filled 2024-01-26: qty 30, 30d supply, fill #0
  Filled 2024-02-24: qty 30, 30d supply, fill #1
  Filled 2024-03-25: qty 30, 30d supply, fill #2

## 2024-01-27 ENCOUNTER — Other Ambulatory Visit: Payer: Self-pay

## 2024-01-27 ENCOUNTER — Other Ambulatory Visit (HOSPITAL_COMMUNITY): Payer: Self-pay

## 2024-01-27 MED ORDER — DONEPEZIL HCL 10 MG PO TABS: 10.0000 mg | ORAL_TABLET | Freq: Every day | ORAL | 1 refills | Status: AC

## 2024-01-30 IMAGING — MR MR ABDOMEN WO/W CM
11 of 17 series · 28 of 48 positions shown · IV contrast (13ml Multihance)
Comparison: Multiple exams, including CT abdomen 03/05/2021 and MRI
abdomen from 02/28/2020

CLINICAL DATA: History of renal cancer in 6126, with right
nephrectomy and partial left nephrectomy. Patient has had some
recent abdominal pain

EXAM:
MRI ABDOMEN WITHOUT AND WITH CONTRAST
TECHNIQUE: Multiplanar multisequence MR imaging of the abdomen was performed
both before and after the administration of intravenous contrast.
CONTRAST:  13mL MULTIHANCE GADOBENATE DIMEGLUMINE 529 MG/ML IV SOLN

[Series 3: cor haste · coronal · 5.0mm · 0.68mm/px · 2 of 32 slices shown]
[im 1/32]
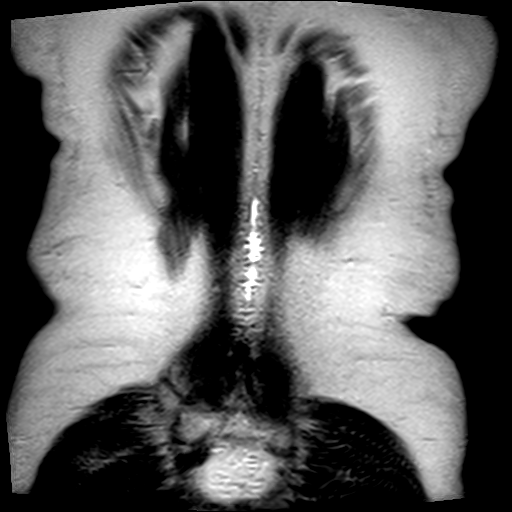
[im 32/32]
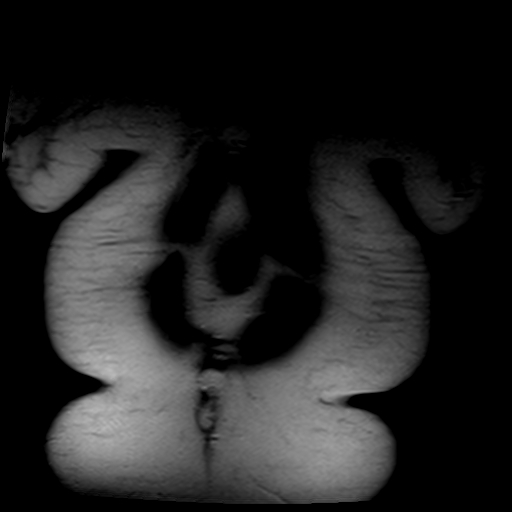

[Series 4: axial haste · axial · 6.0mm · 0.68mm/px · z∈[-29,+156]mm · 2 of 29 slices shown]
[im 1/29]
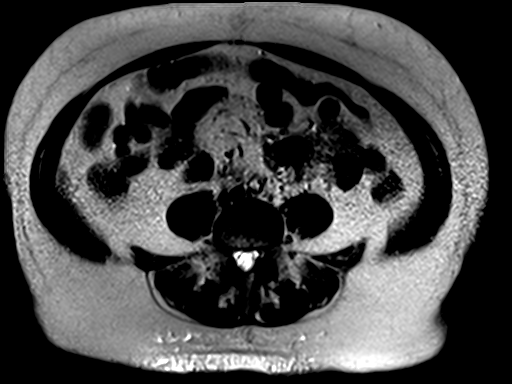
[im 29/29]
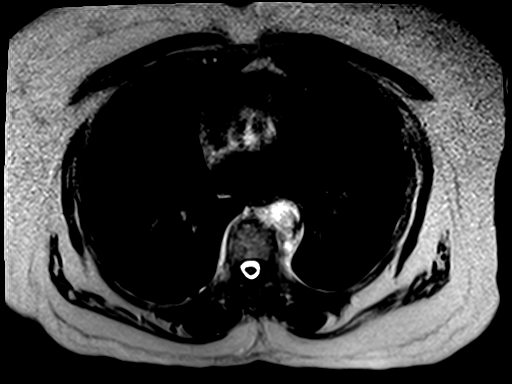

[Series 5: T1 · axial · 6.0mm · 0.68mm/px · z∈[-42,+169]mm · 4 of 66 slices shown]
[im 1/66]
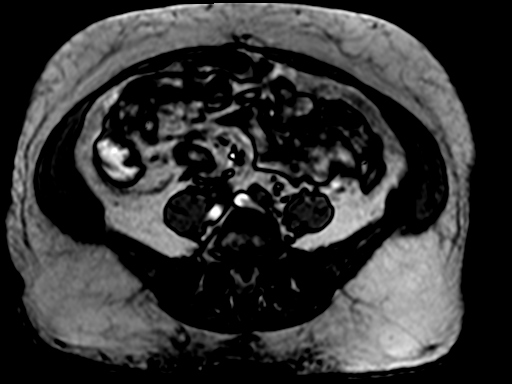
[im 22/66]
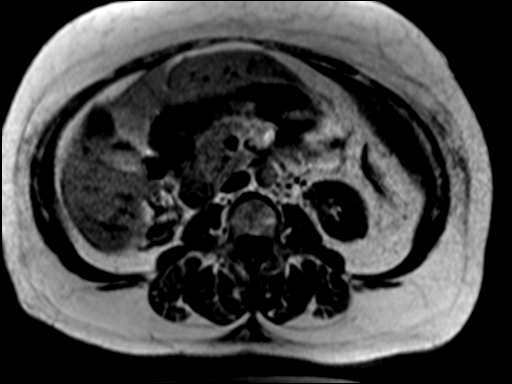
[im 44/66]
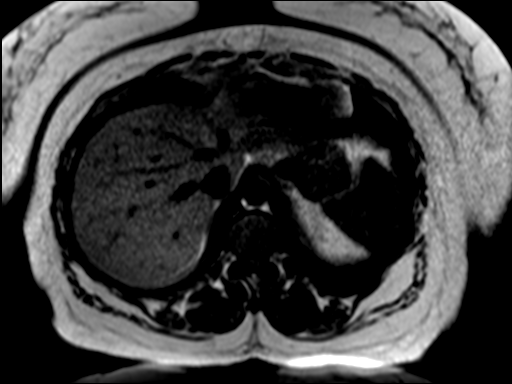
[im 66/66]
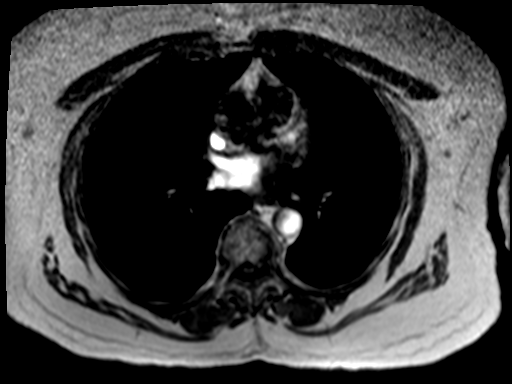

[Series 6: bSSFP · axial · 4.0mm · 0.68mm/px · z∈[-40,+168]mm · 3 of 53 slices shown]
[im 1/53]
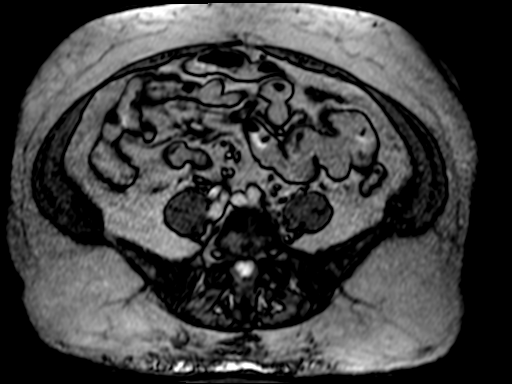
[im 27/53]
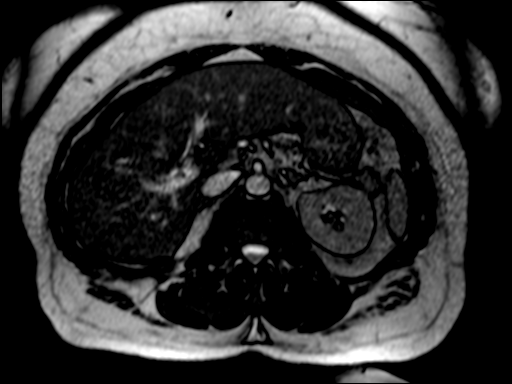
[im 53/53]
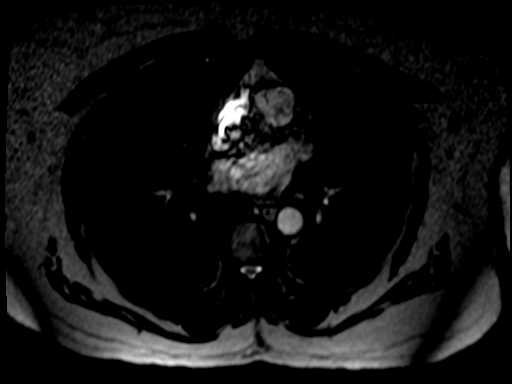

[Series 7: T2 fat-sat · axial · 6.0mm · 1.09mm/px · z∈[-29,+195]mm · 2 of 32 slices shown]
[im 1/32]
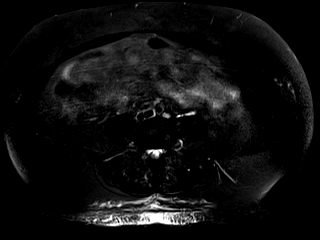
[im 32/32]
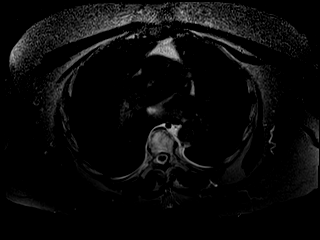

[Series 8: ep2d_[id number]_500_800_p2_trig · axial · 6.0mm · 1.82mm/px · z∈[-29,+195]mm · 4 of 96 slices shown]
[im 1/96]
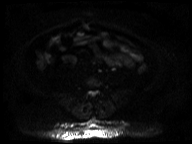
[im 32/96]
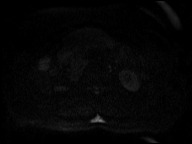
[im 64/96]
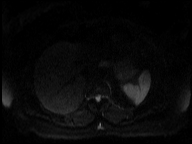
[im 96/96]
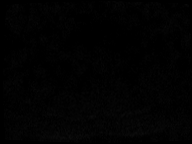

[Series 9: [id number]_800_p2_trig_adc · axial · 6.0mm · 1.82mm/px · 1 of 32 slices shown]
[im 1/32]
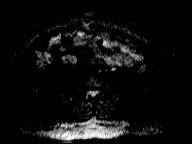

[Series 10: T1 dynamic · axial · non-contrast · 2.5mm · 0.74mm/px · z∈[-17,+181]mm · 3 of 80 slices shown]
[im 1/80]
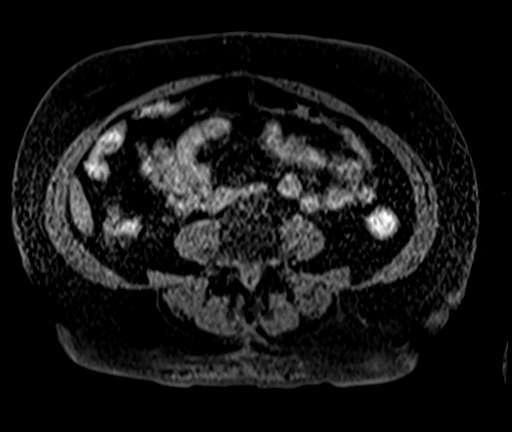
[im 40/80]
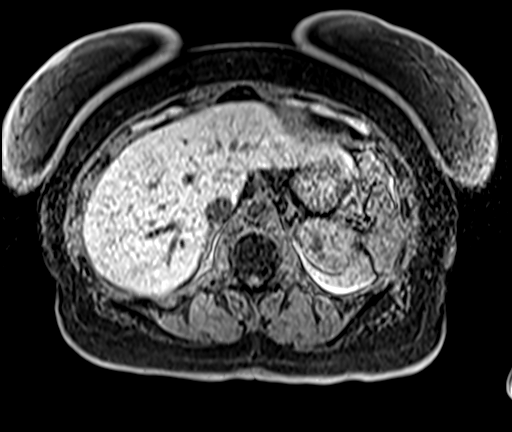
[im 80/80]
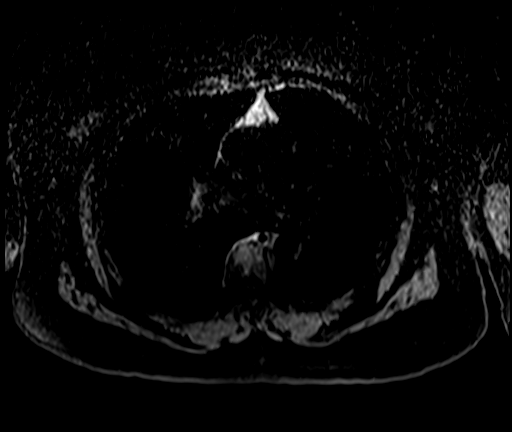

[Series 11: T1 dynamic post-contrast · axial · 2.5mm · 0.74mm/px · z∈[-17,+181]mm · 3 of 80 slices shown (1 of 3)]
[im 1/80]
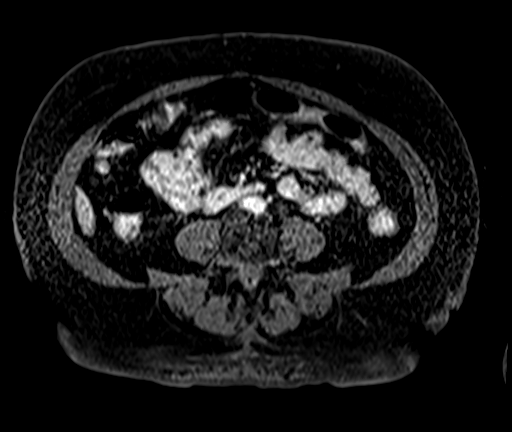
[im 40/80]
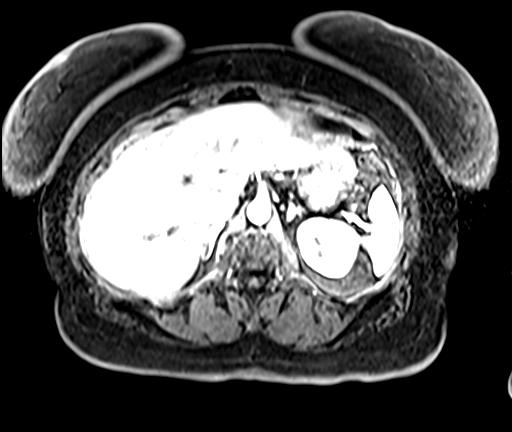
[im 80/80]
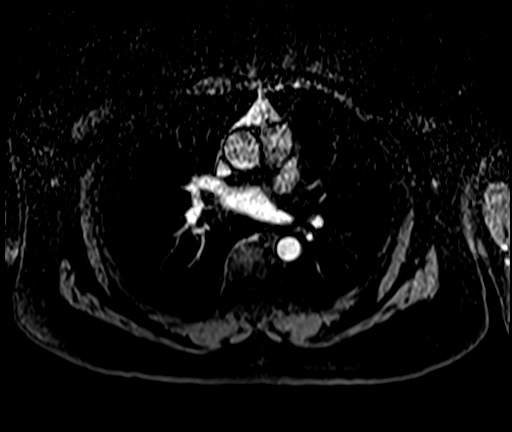

[Series 12: T1 dynamic post-contrast · axial · 2.5mm · 0.74mm/px · z∈[-17,+181]mm · 3 of 80 slices shown (2 of 3)]
[im 1/80]
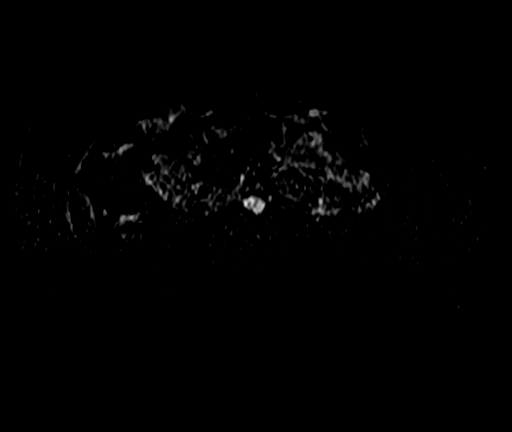
[im 40/80]
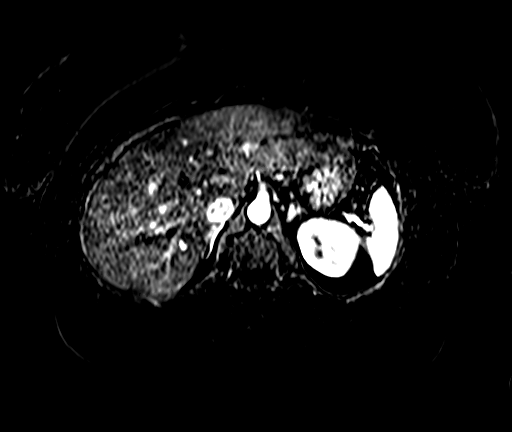
[im 80/80]
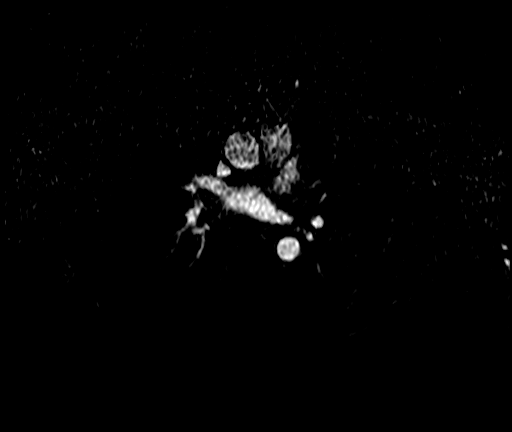

[Series 13: T1 dynamic post-contrast · axial · 2.5mm · 0.74mm/px · 1 of 80 slices shown (3 of 3)]
[im 1/80]
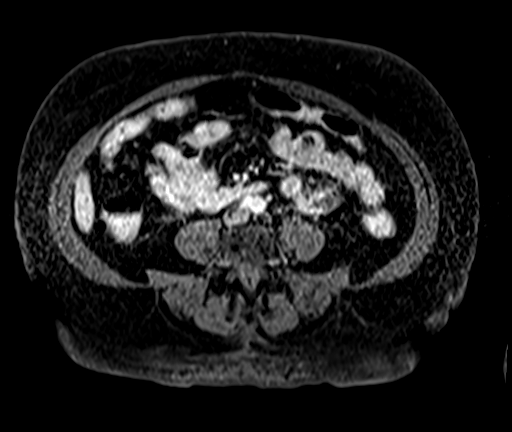

[28 of 48 positions shown; findings below may reference images not displayed]

FINDINGS: Lower chest: Unremarkable

Hepatobiliary: The small hypoechoic lesion adjacent to the
gallbladder on recent ultrasound seems to best correlate with a 5 by
6 mm focus of early enhancement and delayed enhancement in the right
hepatic lobe adjacent to the gallbladder for example on image 62 of
series 11. Given the delayed enhancement this may reflect a small
hemangioma, with mildly atypical sonographic features given the
hypoechogenicity. Gallbladder unremarkable. No biliary dilatation.

Pancreas:  Unremarkable

Spleen:  Unremarkable

Adrenals/Urinary Tract:  Both adrenal glands appear normal.

Right nephrectomy, no local recurrence observed.

Partial left nephrectomy posteriorly along the mid kidney, stable
contour in this vicinity without abnormal enhancement to indicate
locally recurrent tumor. No new left renal mass observed. Otherwise
normal renal parenchymal enhancement.

Stomach/Bowel: Unremarkable

Vascular/Lymphatic: The left renal vein appears unremarkable. No
pathologic adenopathy.

Other:  No supplemental non-categorized findings.

Musculoskeletal: Lower lumbar spondylosis and degenerative disc
disease.
IMPRESSION: 1. No findings of active malignancy in the upper abdomen. Prior
right nephrectomy and prior partial left nephrectomy.
2. Lower lumbar spondylosis and degenerative disc disease.
3. On ultrasound there was a suspected tiny cyst along the margin of
the gallbladder; on today's MRI I do not see a definite cyst
although there is a small (5 mm) focus of early and delayed
enhancement along in the liver along the gallbladder fossa which
could potentially reflect a small hemangioma.

## 2024-02-06 ENCOUNTER — Other Ambulatory Visit: Payer: Self-pay

## 2024-02-21 IMAGING — DX DG CHEST 2V
2 series · 2 of 2 positions shown · non-contrast
Comparison: 03/05/2021

CLINICAL DATA: Renal cell carcinoma

EXAM:
CHEST - 2 VIEW

[chest pa]
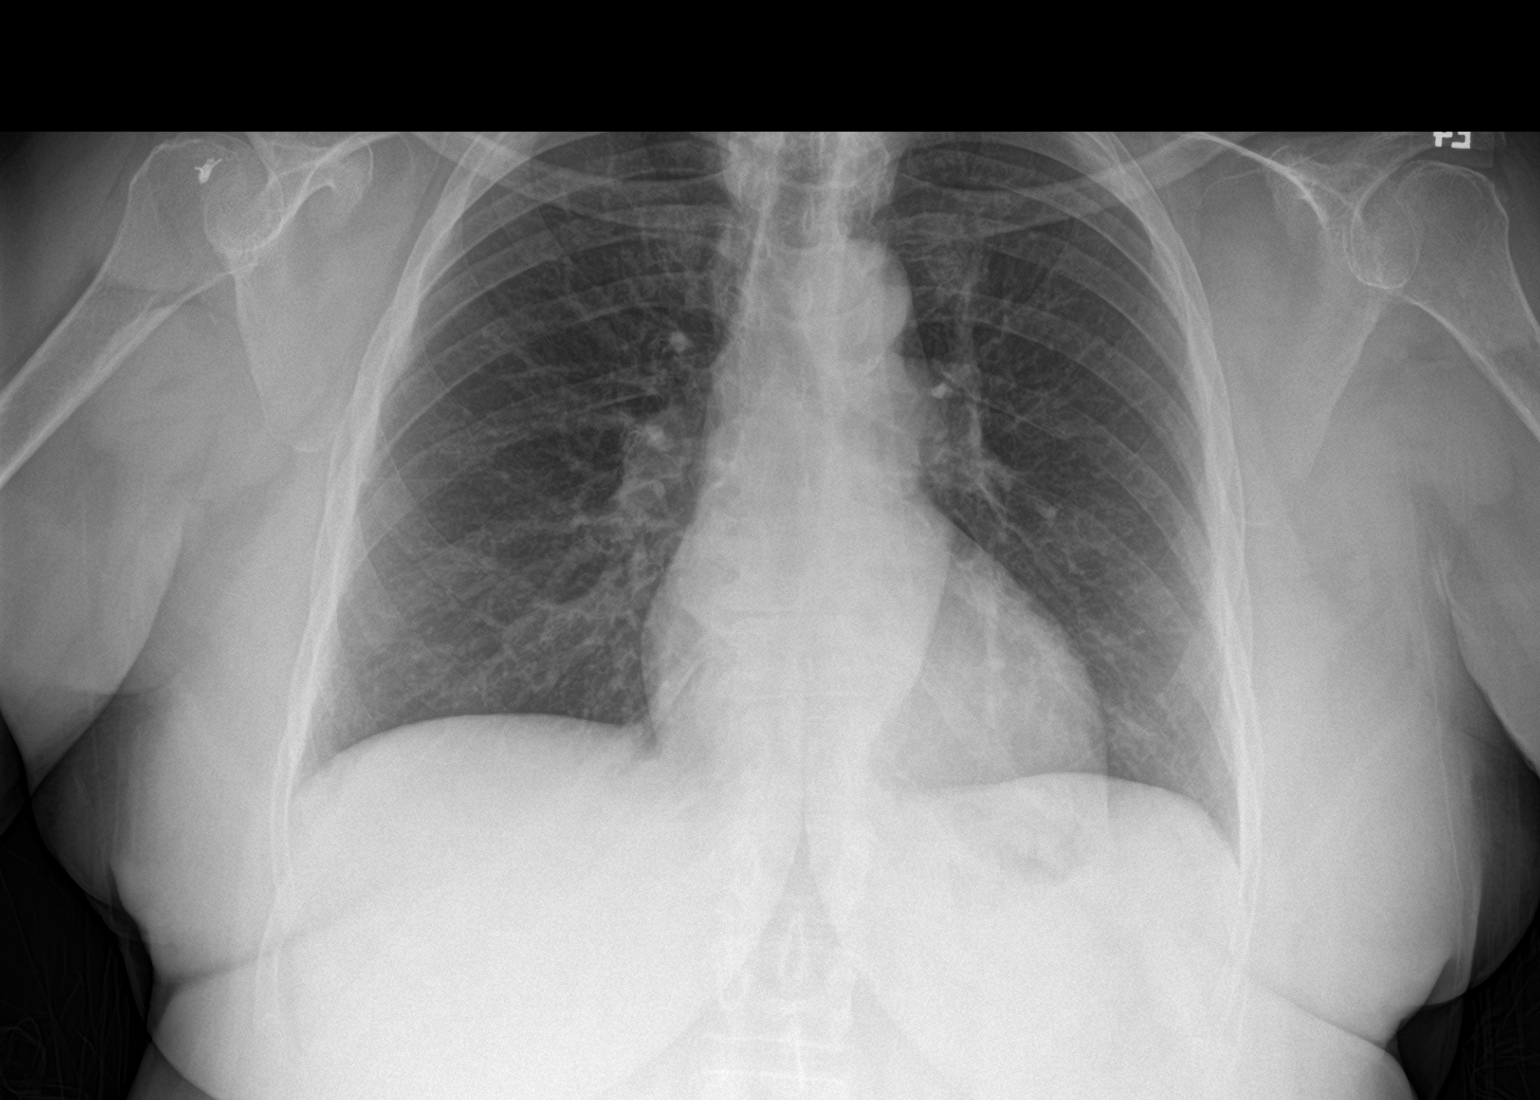

[chest lat]
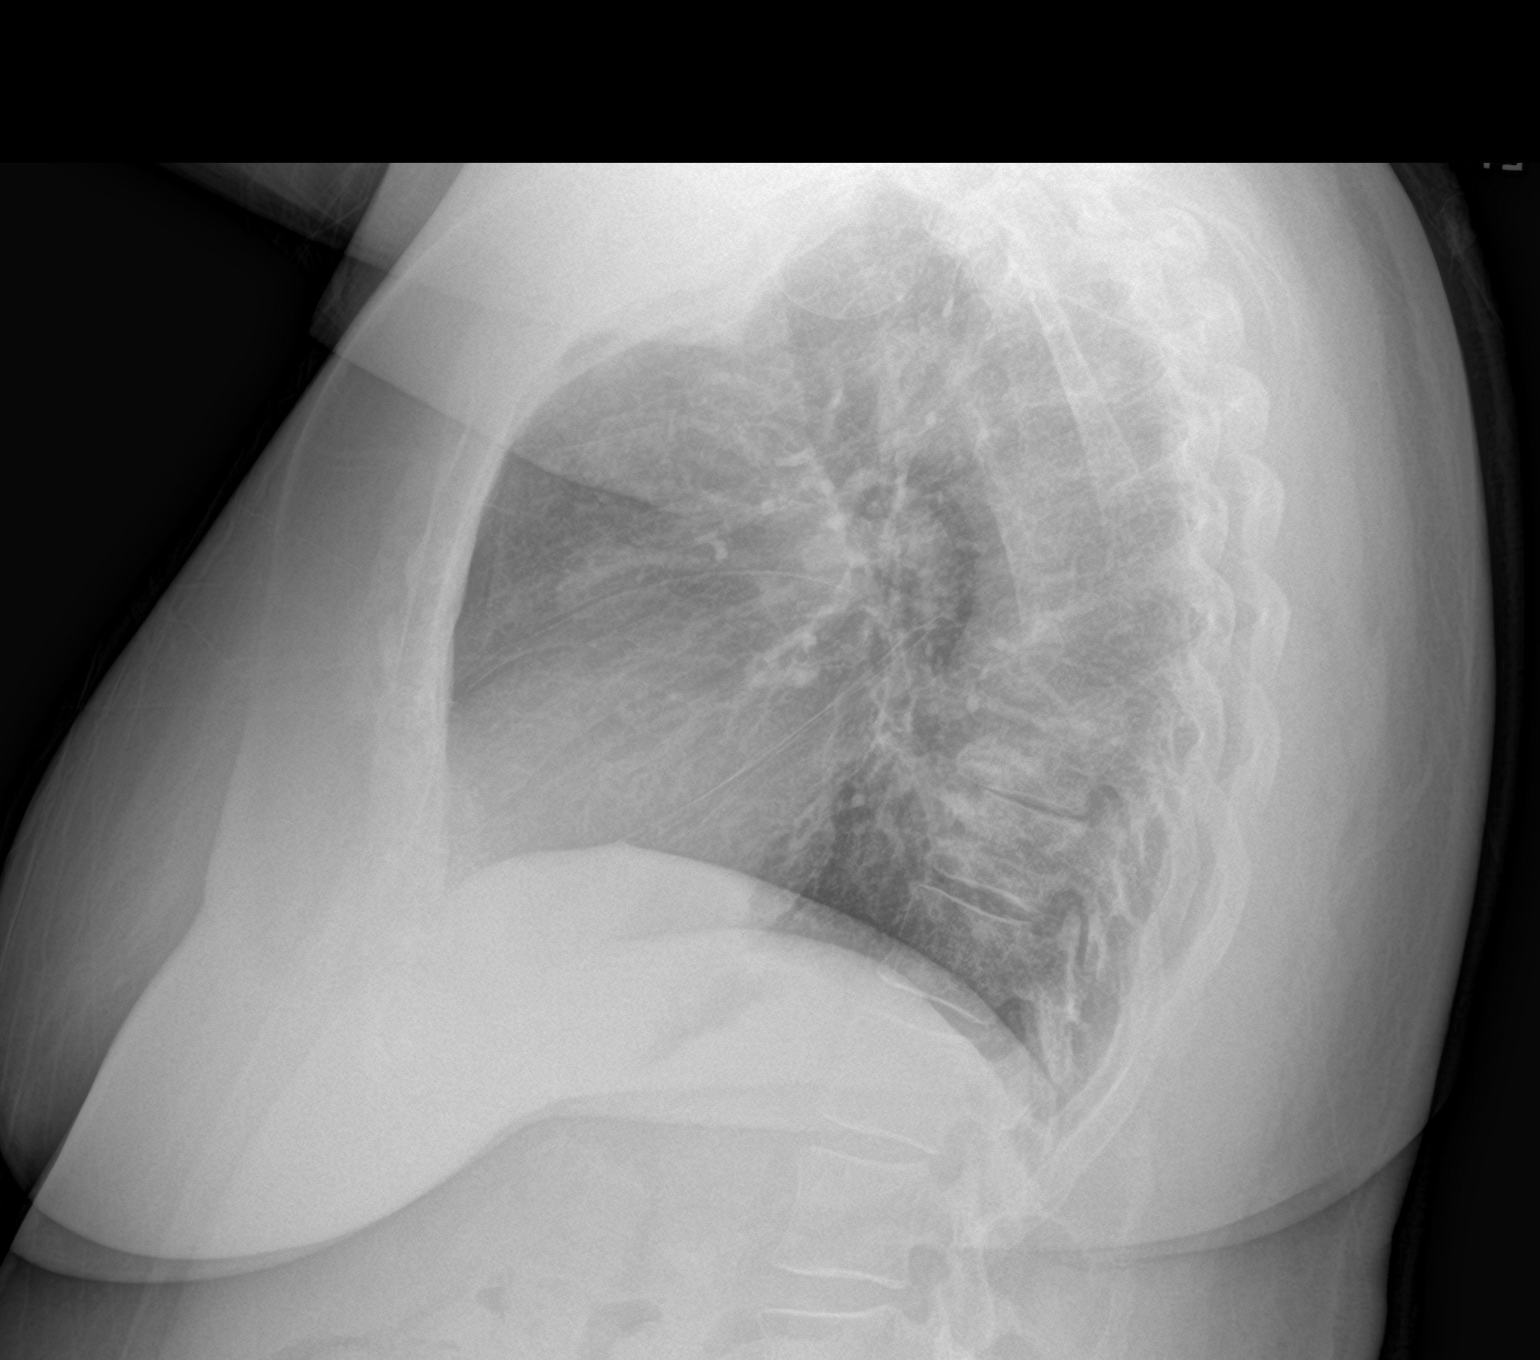

[2 of 2 positions shown; findings below may reference images not displayed]

FINDINGS: The heart size and mediastinal contours are within normal limits.
Both lungs are clear. There is surgical fusion in the lower cervical
spine. Postsurgical changes are noted in the right shoulder.
IMPRESSION: No active cardiopulmonary disease.

## 2024-02-22 ENCOUNTER — Other Ambulatory Visit (HOSPITAL_COMMUNITY): Payer: Self-pay

## 2024-02-24 ENCOUNTER — Other Ambulatory Visit: Payer: Self-pay

## 2024-02-25 ENCOUNTER — Other Ambulatory Visit: Payer: Self-pay

## 2024-02-26 ENCOUNTER — Other Ambulatory Visit: Payer: Self-pay

## 2024-02-27 ENCOUNTER — Other Ambulatory Visit: Payer: Self-pay

## 2024-02-27 ENCOUNTER — Other Ambulatory Visit (HOSPITAL_COMMUNITY): Payer: Self-pay

## 2024-03-01 ENCOUNTER — Other Ambulatory Visit (HOSPITAL_COMMUNITY): Payer: Self-pay

## 2024-03-01 ENCOUNTER — Other Ambulatory Visit: Payer: Self-pay

## 2024-03-02 ENCOUNTER — Other Ambulatory Visit (HOSPITAL_COMMUNITY): Payer: Self-pay

## 2024-03-04 ENCOUNTER — Other Ambulatory Visit: Payer: Self-pay

## 2024-03-05 ENCOUNTER — Other Ambulatory Visit (HOSPITAL_COMMUNITY): Payer: Self-pay

## 2024-03-10 ENCOUNTER — Other Ambulatory Visit (HOSPITAL_COMMUNITY): Payer: Self-pay

## 2024-03-23 ENCOUNTER — Other Ambulatory Visit (HOSPITAL_COMMUNITY): Payer: Self-pay

## 2024-03-25 ENCOUNTER — Other Ambulatory Visit (HOSPITAL_COMMUNITY): Payer: Self-pay

## 2024-03-25 ENCOUNTER — Other Ambulatory Visit: Payer: Self-pay

## 2024-03-25 MED ORDER — GABAPENTIN 300 MG PO CAPS
300.0000 mg | ORAL_CAPSULE | Freq: Every day | ORAL | 1 refills | Status: AC
  Filled 2024-03-25: qty 30, 30d supply, fill #0

## 2024-04-15 IMAGING — CR DG KNEE 3 VIEWS*L*
3 series · 3 of 3 positions shown · non-contrast
Comparison: None.

CLINICAL DATA: Intermittent anterior left knee pain

EXAM:
LEFT KNEE - 3 VIEW

[w knee ap left]
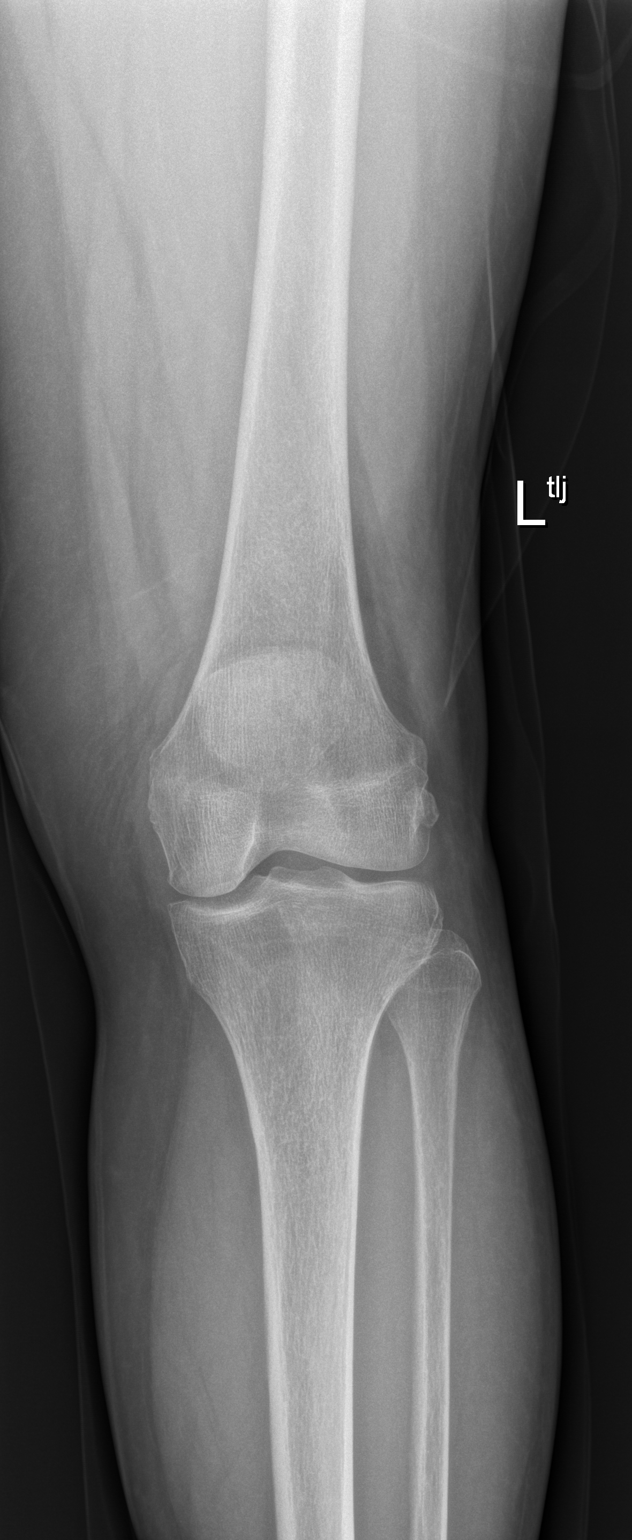

[w knee lat left]
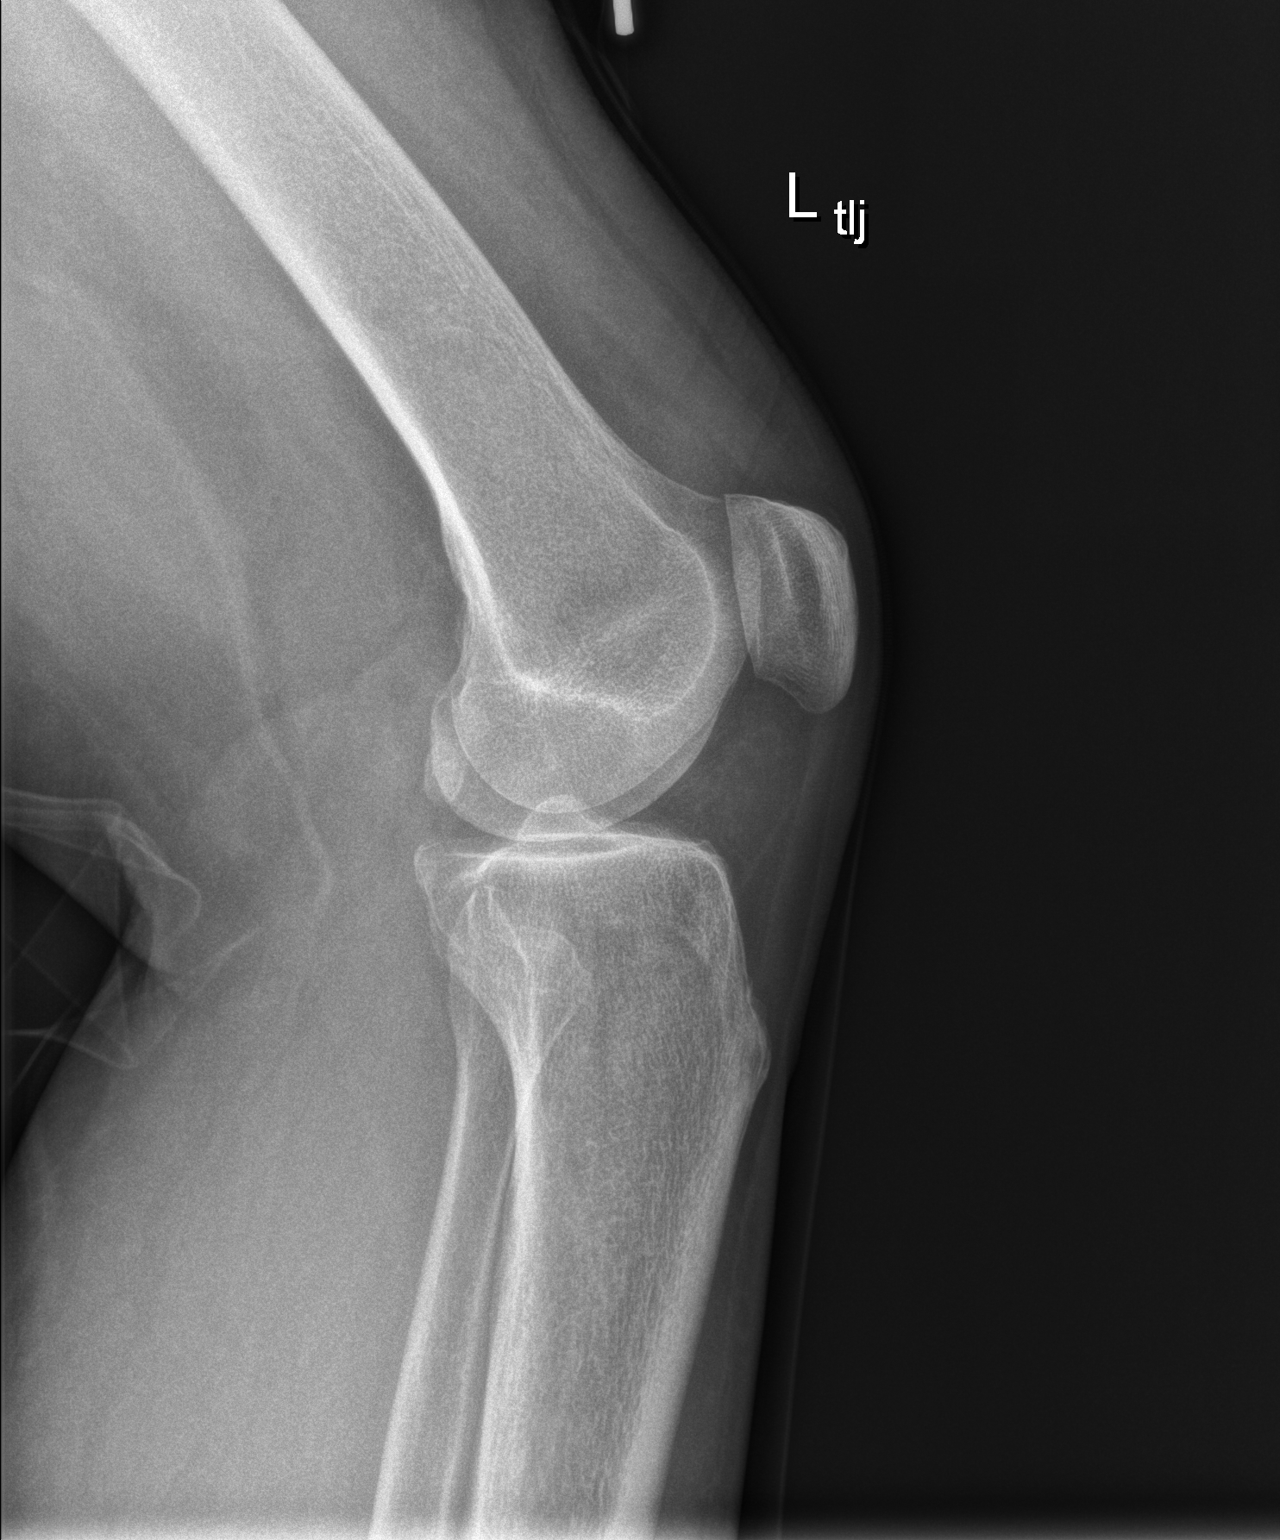

[x knee sunrise left]
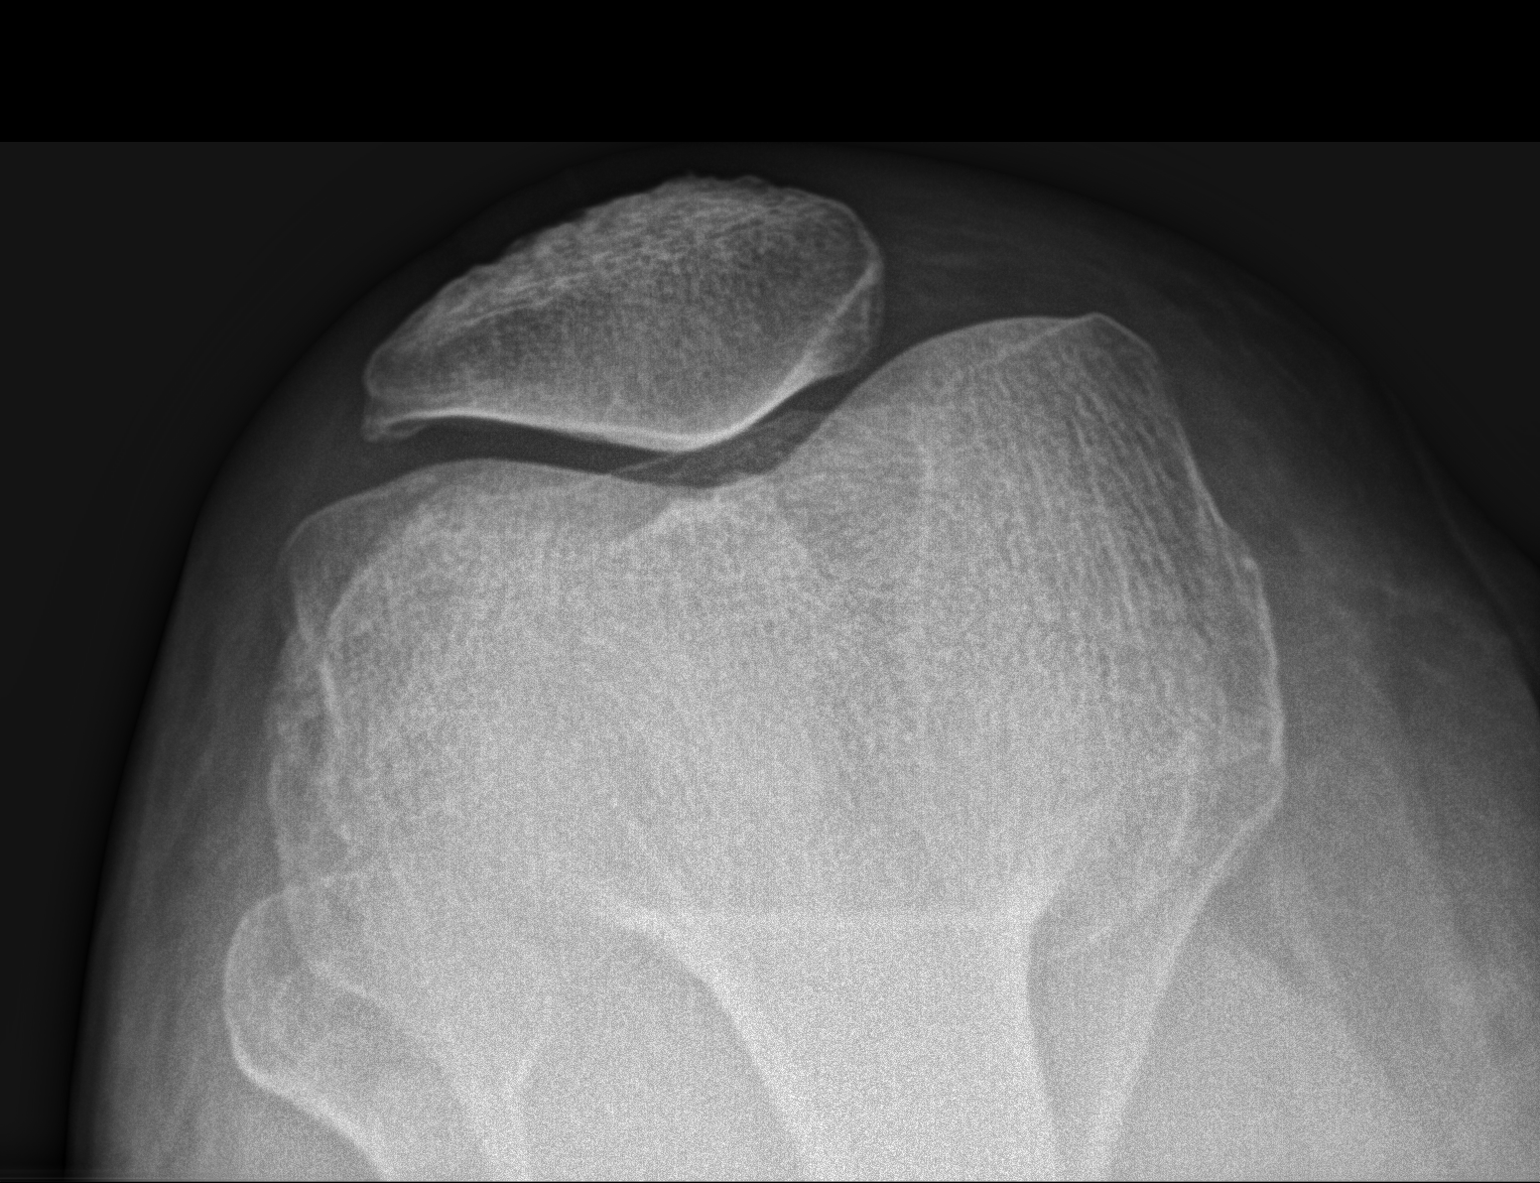

[3 of 3 positions shown; findings below may reference images not displayed]

FINDINGS: No evidence of fracture, dislocation, or joint effusion. Mild medial
and patellar compartmental degenerative change, with relatively
preserved lateral joint space. Soft tissues are unremarkable.
IMPRESSION: Mild degenerative changes without acute osseous abnormality.

## 2024-04-29 IMAGING — MR MR MRA NECK W/O CM
1 series · 18 of 48 positions shown · non-contrast
Comparison: Same-day noncontrast head CT, MR head 07/22/2017

CLINICAL DATA: Altered mental status, stroke suspected



[Series 12: TOF · axial · 2.4mm · 0.47mm/px · z∈[-267,-94]mm · 18 of 152 slices shown]
[im 1/152]
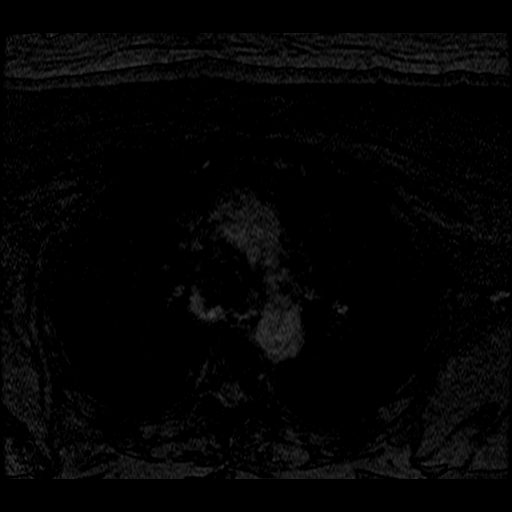
[im 4/152]
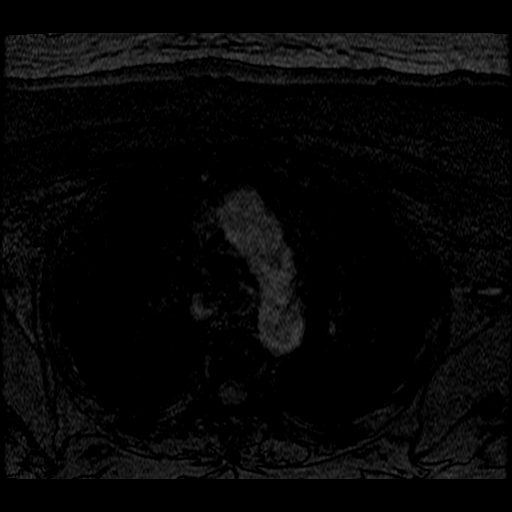
[im 7/152]
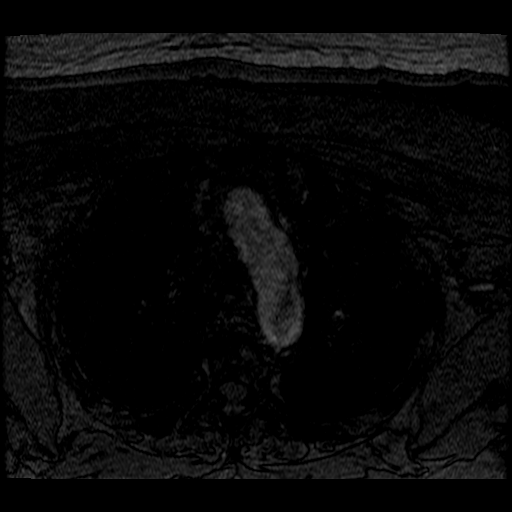
[im 10/152]
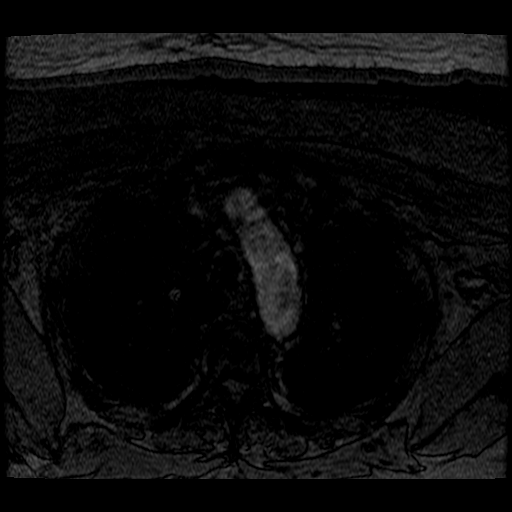
[im 13/152]
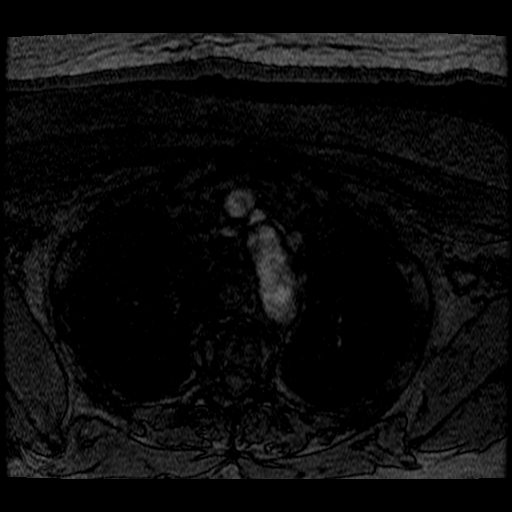
[im 17/152]
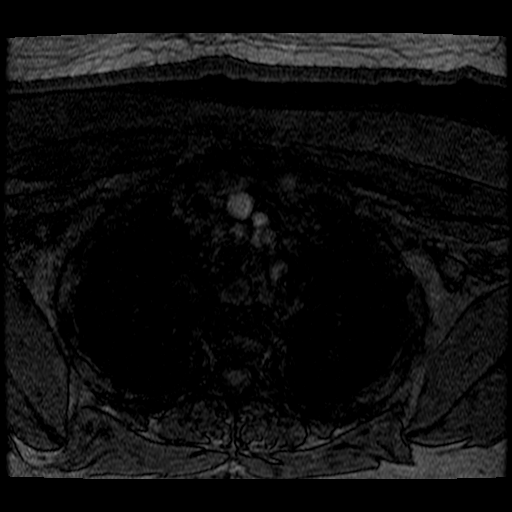
[im 20/152]
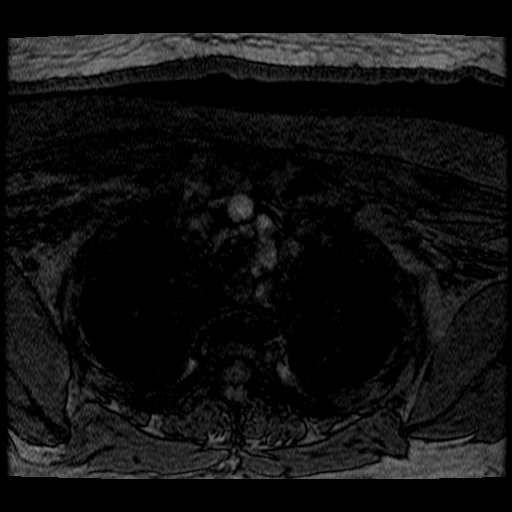
[im 23/152]
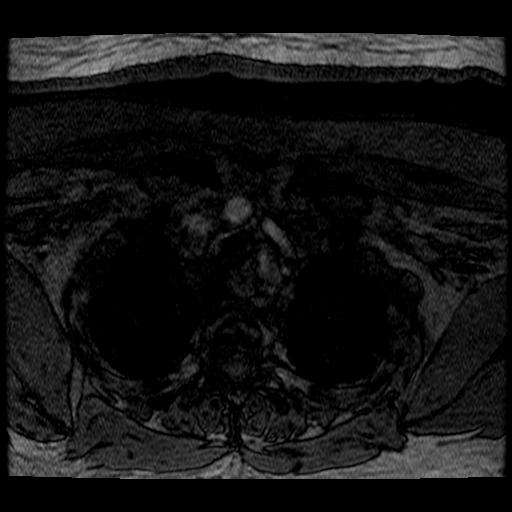
[im 26/152]
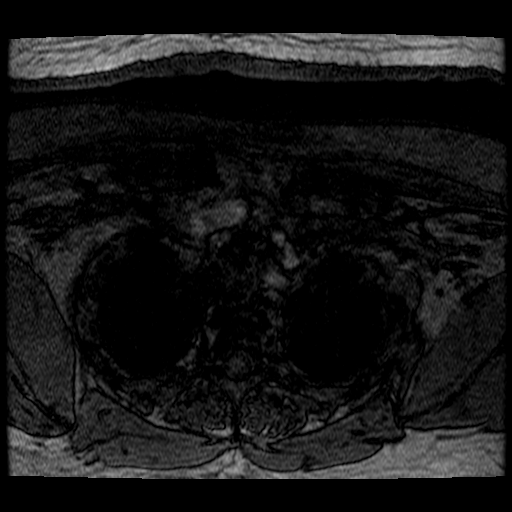
[im 29/152]
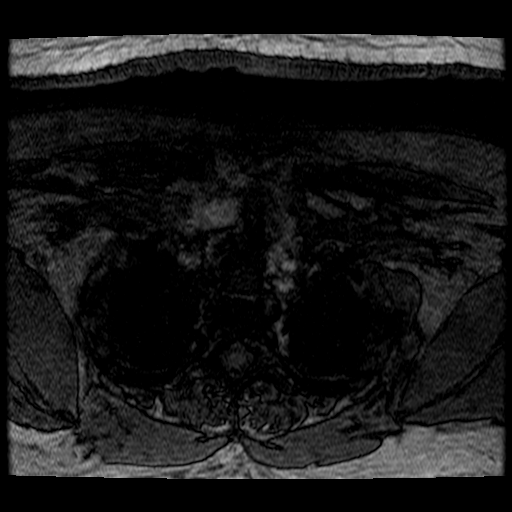
[im 49/152]
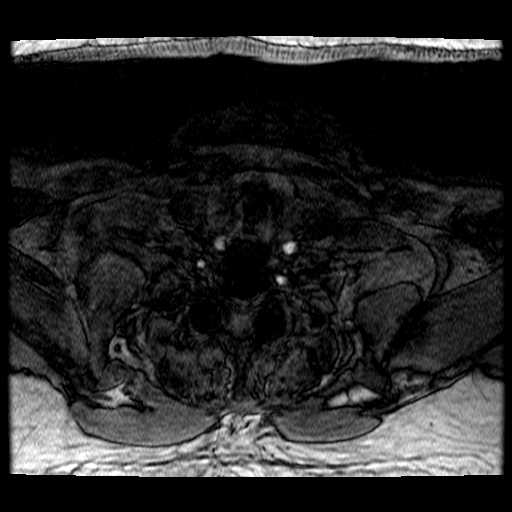
[im 68/152]
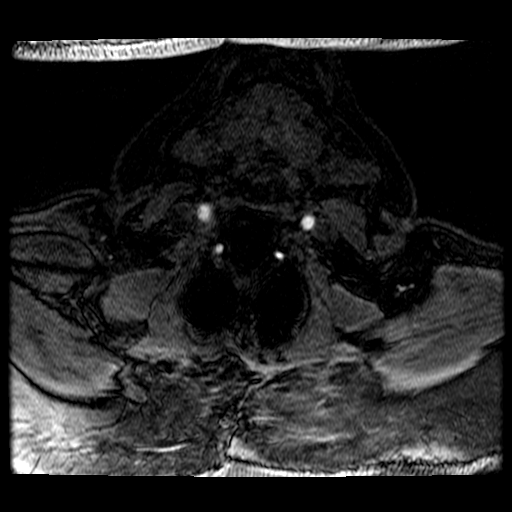
[im 78/152]
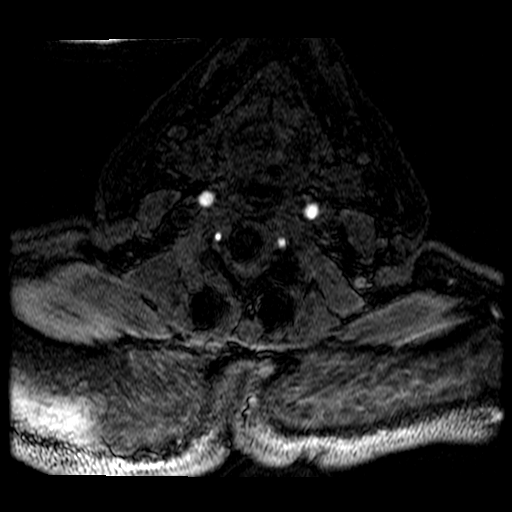
[im 87/152]
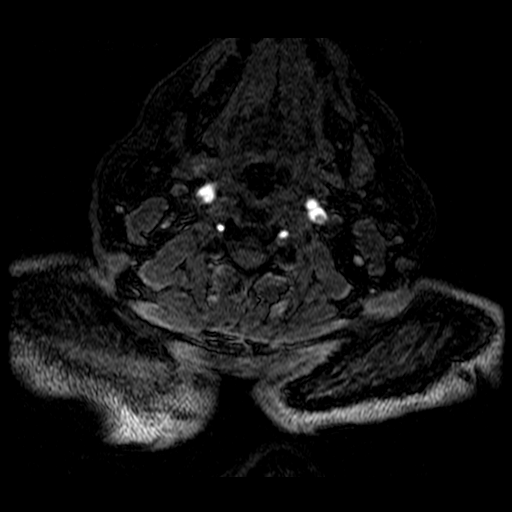
[im 107/152]
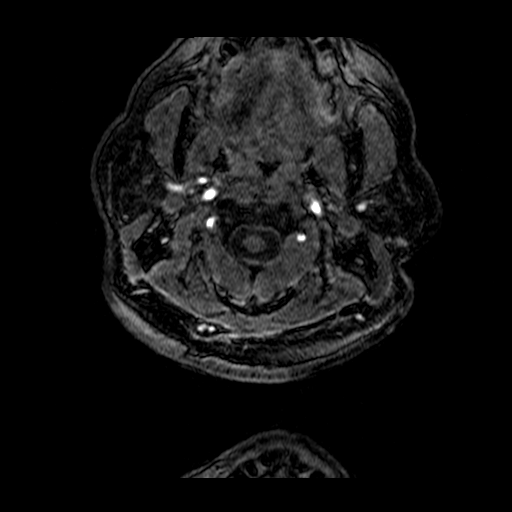
[im 126/152]
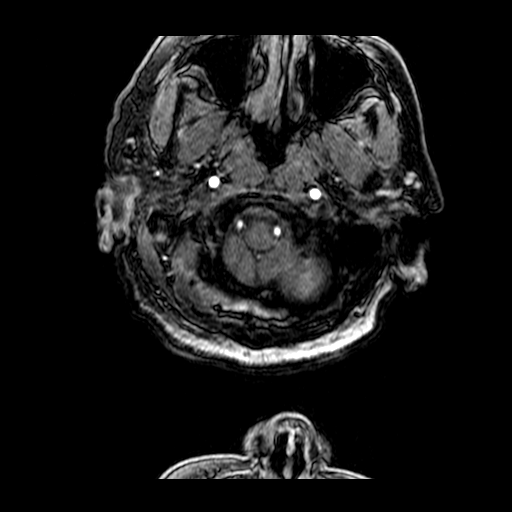
[im 129/152]
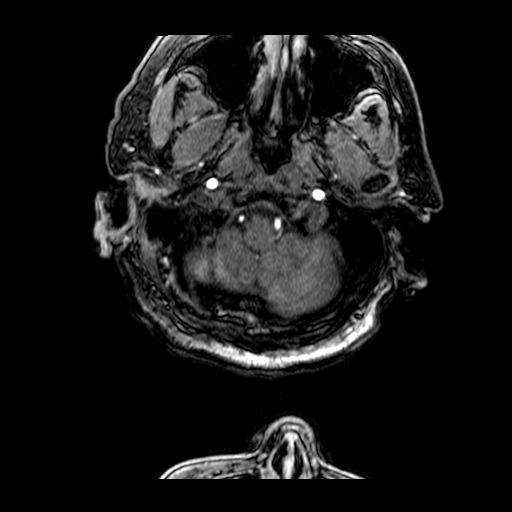
[im 145/152]
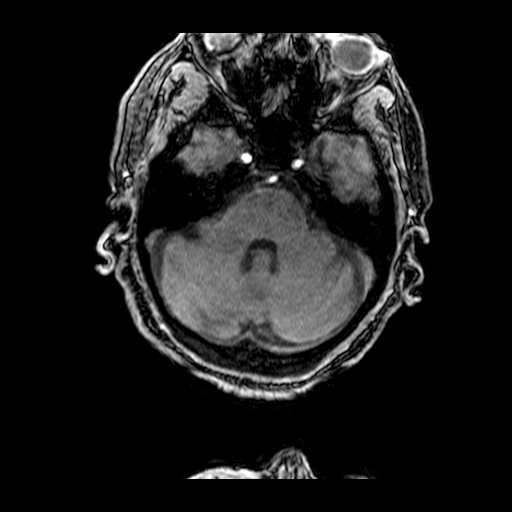

[18 of 48 positions shown; findings below may reference images not displayed]

FINDINGS: MRI HEAD FINDINGS

Brain: There is no acute intracranial hemorrhage, extra-axial fluid
collection, or acute infarct.

There is mild global parenchymal volume loss with prominence of the
ventricular system and extra-axial CSF spaces. Patchy FLAIR signal
abnormality in the subcortical and periventricular white matter
likely reflects sequela of mild chronic white matter
microangiopathy.

There is no suspicious parenchymal signal abnormality. There is no
mass lesion. There is no mass effect or midline shift.

Vascular: See below.

Skull and upper cervical spine: Normal marrow signal.

Sinuses/Orbits: The paranasal sinuses are clear. Bilateral lens
implants are in place. The globes and orbits are otherwise
unremarkable.

Other: None.

MRA HEAD FINDINGS

Anterior circulation: The intracranial ICAs are patent

The bilateral MCAs are patent.

The bilateral ACAs are patent. The anterior communicating artery is
normal.

There is no aneurysm or AVM.

Posterior circulation: Bilateral V4 segments are patent. The basilar
artery is patent.

The bilateral PCAs are patent. A prominent right posterior
communicating artery is identified. The left posterior communicating
artery is not definitely seen.

There is no aneurysm or AVM.

Anatomic variants: As above.

MRA NECK FINDINGS

Aortic arch: The imaged aortic arch is normal. The origins of the
major branch vessels appear patent.

Right carotid system: Proximal common carotid artery is suboptimally
evaluated due to artifact. The imaged common carotid artery is
patent. The internal and external carotid arteries are patent. There
is no evidence of hemodynamically significant stenosis or occlusion.
There is no evidence of dissection or aneurysm.

Left carotid system: The left common, internal, and external carotid
arteries are patent, without evidence of hemodynamically significant
stenosis or occlusion. There is no evidence of dissection or
aneurysm.

Vertebral arteries: Proximal vertebral arteries are suboptimally
evaluated, particularly on the right. The imaged vertebral arteries
are patent with antegrade flow, without evidence of hemodynamically
significant stenosis or occlusion. There is no evidence of
dissection or aneurysm.

Other: None.
IMPRESSION: 1. No acute intracranial pathology. Mild parenchymal volume loss and
chronic white matter microangiopathy.
2. Patent vasculature of the head and neck with no evidence of
hemodynamically significant stenosis, occlusion, or dissection.

## 2024-04-29 IMAGING — MR MR HEAD W/O CM
8 of 12 series · 20 of 48 positions shown · non-contrast
Comparison: Same-day noncontrast head CT, MR head 07/22/2017

CLINICAL DATA: Altered mental status, stroke suspected



[Series 2: DWI · axial · 3.0mm · 0.94mm/px · z∈[-126,+21]mm · 6 of 100 slices shown (1 of 2)]
[im 1/100]
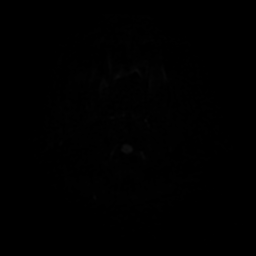
[im 20/100]
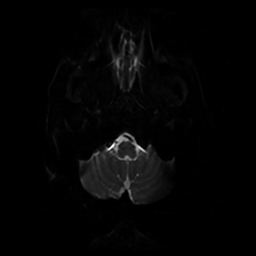
[im 40/100]
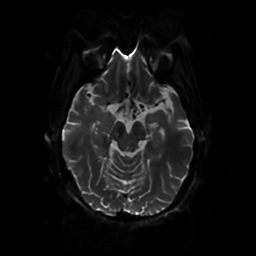
[im 60/100]
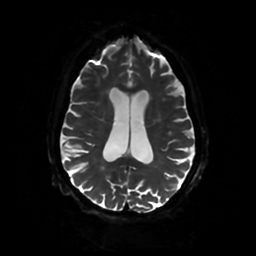
[im 80/100]
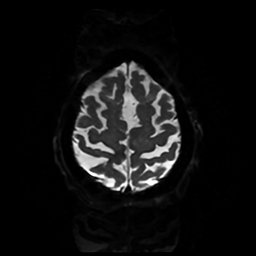
[im 100/100]
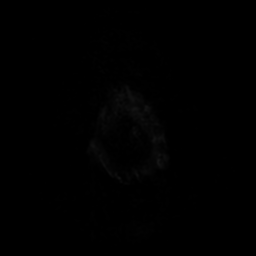

[Series 3: FLAIR · axial · 4.0mm · 0.45mm/px · z∈[-116,+20]mm · 2 of 32 slices shown (1 of 2)]
[im 1/32]
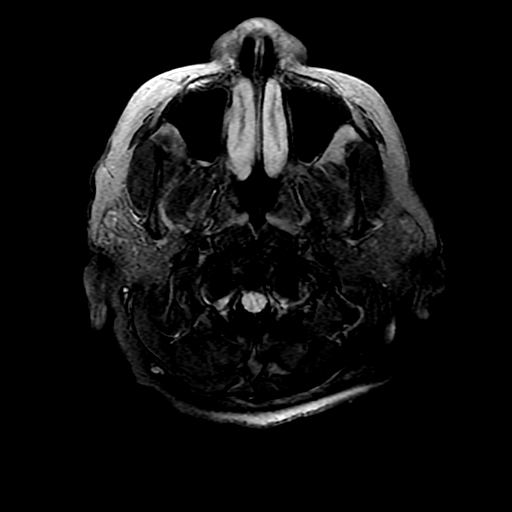
[im 32/32]
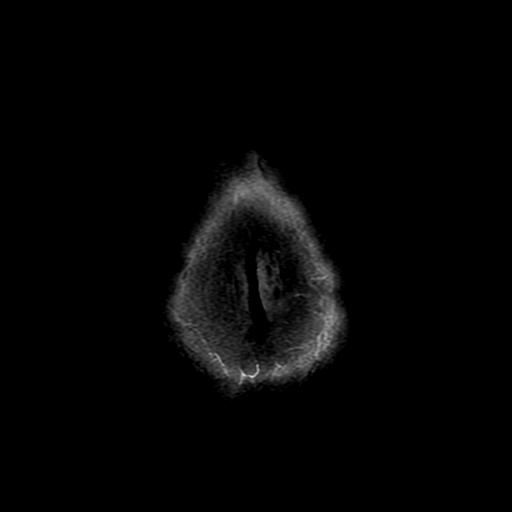

[Series 4: DWI · coronal · 4.0mm · 0.94mm/px · 4 of 70 slices shown (2 of 2)]
[im 1/70]
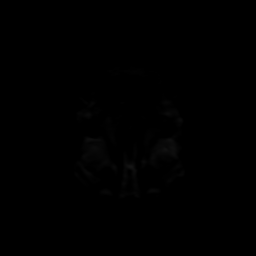
[im 24/70]
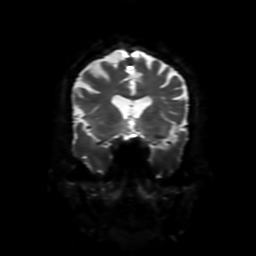
[im 47/70]
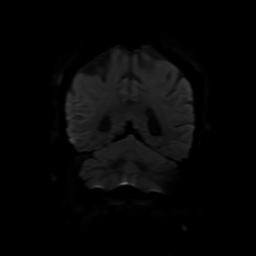
[im 70/70]
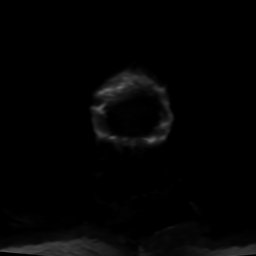

[Series 6: FLAIR · sagittal · 5.0mm · 0.23mm/px · 1 of 23 slices shown (2 of 2)]
[im 1/23]
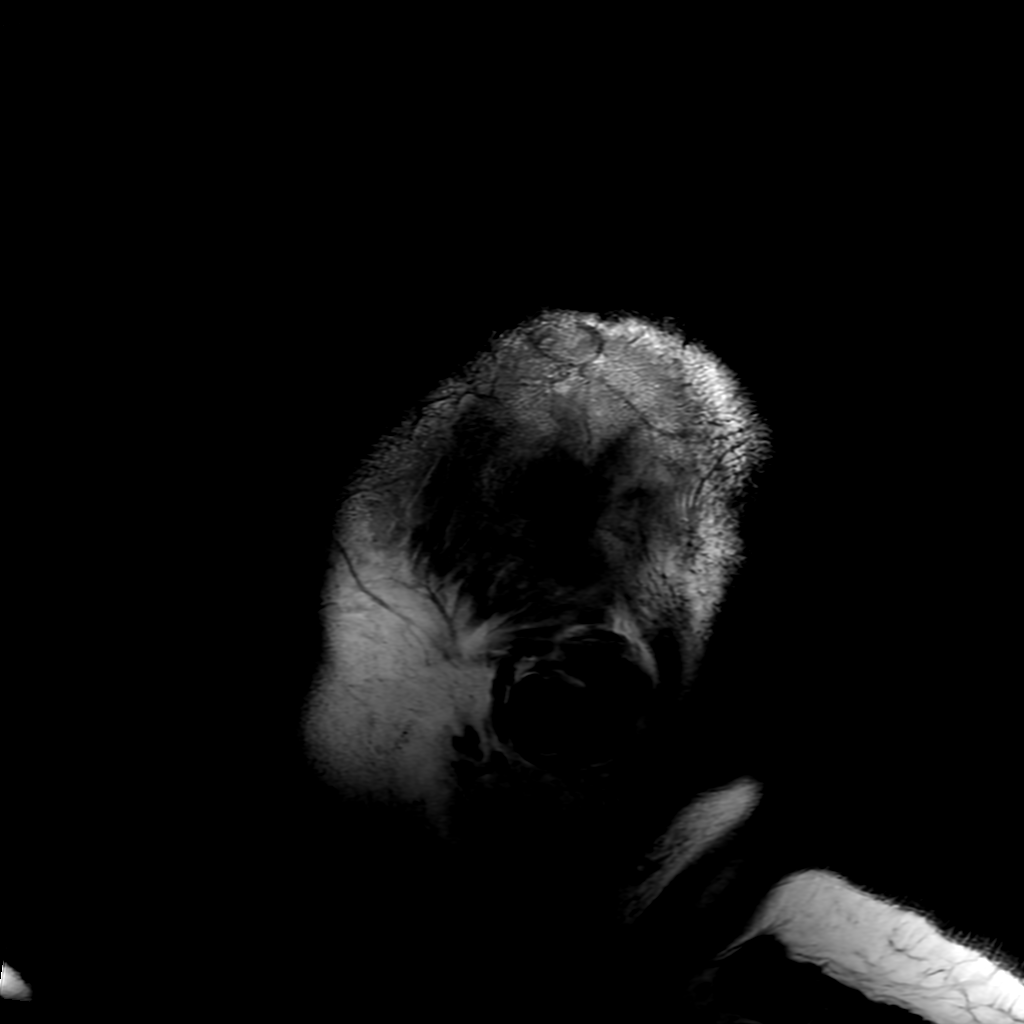

[Series 7: T2 · axial · 5.0mm · 0.23mm/px · 1 of 24 slices shown (1 of 2)]
[im 1/24]
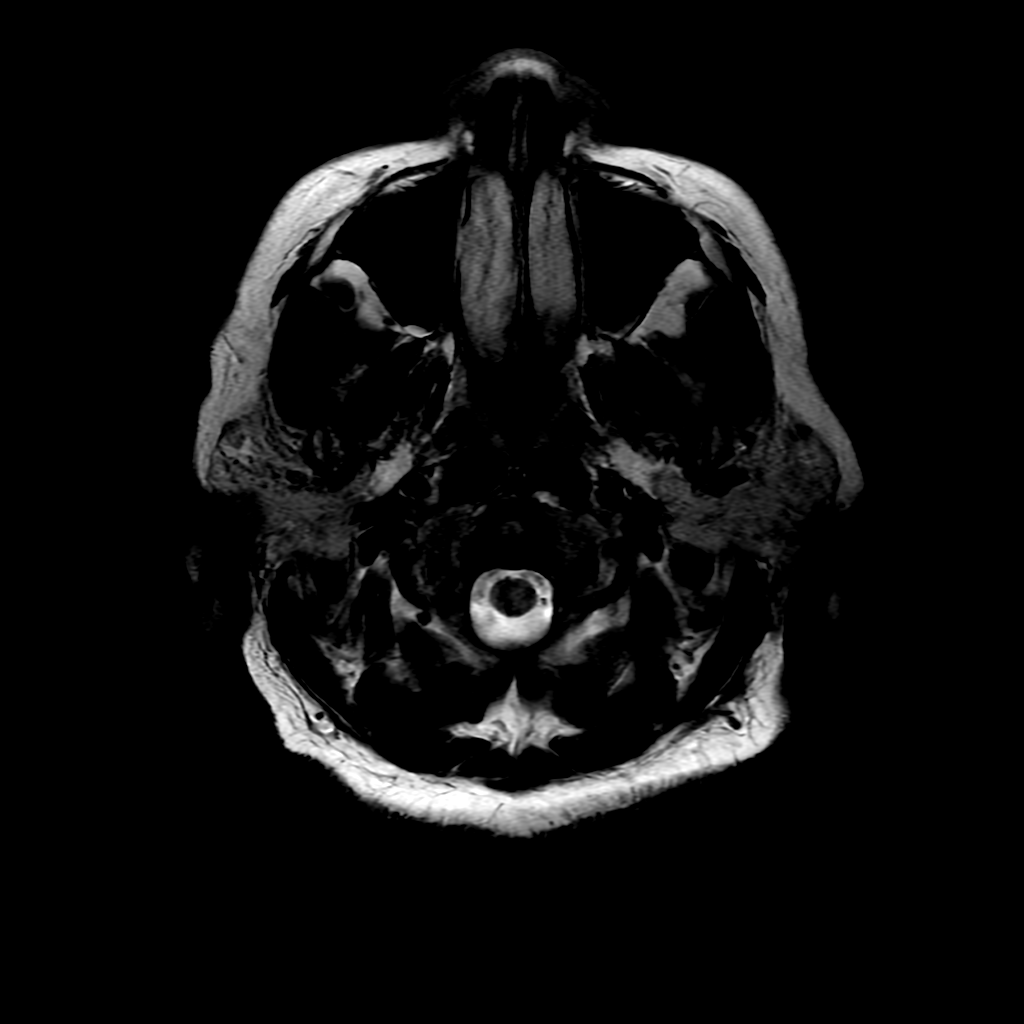

[Series 10: T2 · coronal · 5.0mm · 0.20mm/px · 1 of 28 slices shown (2 of 2)]
[im 1/28]
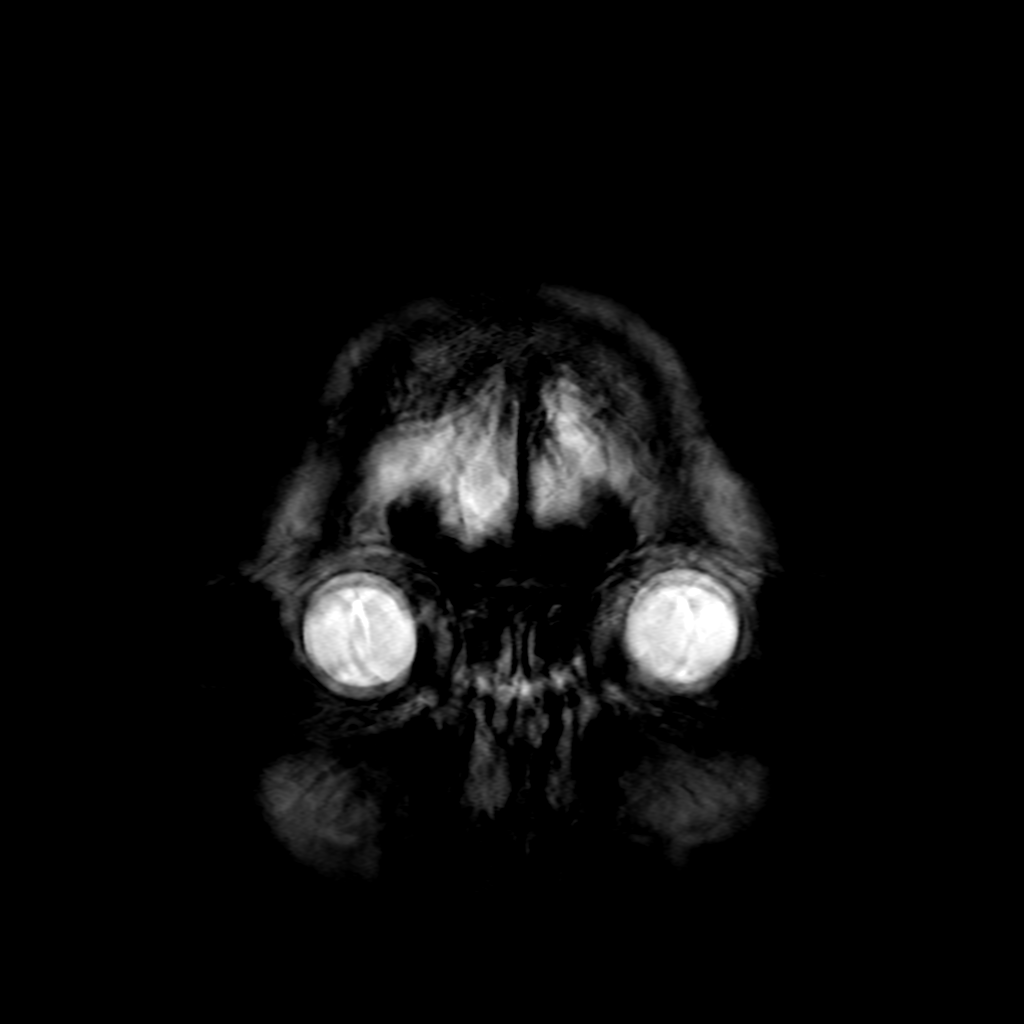

[Series 250: ADC · axial · 3.0mm · 0.94mm/px · z∈[-126,+21]mm · 3 of 50 slices shown (1 of 2)]
[im 1/50]
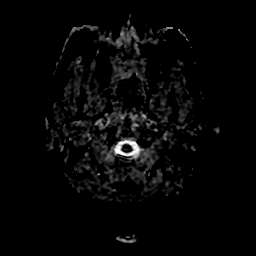
[im 25/50]
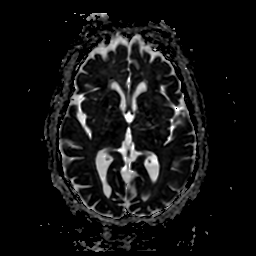
[im 50/50]
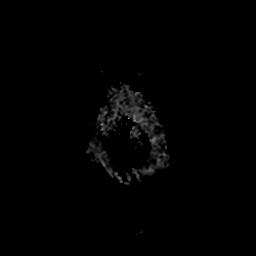

[Series 450: ADC · coronal · 4.0mm · 0.94mm/px · 2 of 35 slices shown (2 of 2)]
[im 1/35]
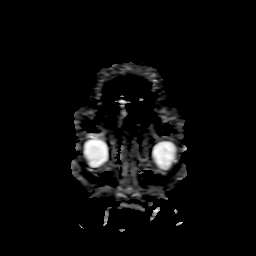
[im 35/35]
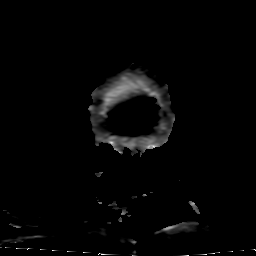

[20 of 48 positions shown; findings below may reference images not displayed]

FINDINGS: MRI HEAD FINDINGS

Brain: There is no acute intracranial hemorrhage, extra-axial fluid
collection, or acute infarct.

There is mild global parenchymal volume loss with prominence of the
ventricular system and extra-axial CSF spaces. Patchy FLAIR signal
abnormality in the subcortical and periventricular white matter
likely reflects sequela of mild chronic white matter
microangiopathy.

There is no suspicious parenchymal signal abnormality. There is no
mass lesion. There is no mass effect or midline shift.

Vascular: See below.

Skull and upper cervical spine: Normal marrow signal.

Sinuses/Orbits: The paranasal sinuses are clear. Bilateral lens
implants are in place. The globes and orbits are otherwise
unremarkable.

Other: None.

MRA HEAD FINDINGS

Anterior circulation: The intracranial ICAs are patent

The bilateral MCAs are patent.

The bilateral ACAs are patent. The anterior communicating artery is
normal.

There is no aneurysm or AVM.

Posterior circulation: Bilateral V4 segments are patent. The basilar
artery is patent.

The bilateral PCAs are patent. A prominent right posterior
communicating artery is identified. The left posterior communicating
artery is not definitely seen.

There is no aneurysm or AVM.

Anatomic variants: As above.

MRA NECK FINDINGS

Aortic arch: The imaged aortic arch is normal. The origins of the
major branch vessels appear patent.

Right carotid system: Proximal common carotid artery is suboptimally
evaluated due to artifact. The imaged common carotid artery is
patent. The internal and external carotid arteries are patent. There
is no evidence of hemodynamically significant stenosis or occlusion.
There is no evidence of dissection or aneurysm.

Left carotid system: The left common, internal, and external carotid
arteries are patent, without evidence of hemodynamically significant
stenosis or occlusion. There is no evidence of dissection or
aneurysm.

Vertebral arteries: Proximal vertebral arteries are suboptimally
evaluated, particularly on the right. The imaged vertebral arteries
are patent with antegrade flow, without evidence of hemodynamically
significant stenosis or occlusion. There is no evidence of
dissection or aneurysm.

Other: None.
IMPRESSION: 1. No acute intracranial pathology. Mild parenchymal volume loss and
chronic white matter microangiopathy.
2. Patent vasculature of the head and neck with no evidence of
hemodynamically significant stenosis, occlusion, or dissection.

## 2024-04-29 IMAGING — CT CT HEAD CODE STROKE
4 series · 16 of 47 positions shown, 18 images · non-contrast
Comparison: 06/22/2019

CLINICAL DATA: Code stroke. Neuro deficit, acute, stroke suspected.



[Series 3: head wo · axial · 0.43mm/px · z∈[-190,-70]mm · 7 of 32 slices shown, 9 images]
[im 4/32  brain]
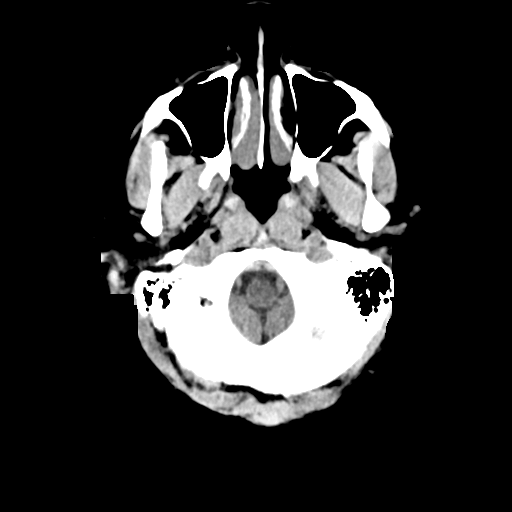
[im 4/32  bone]
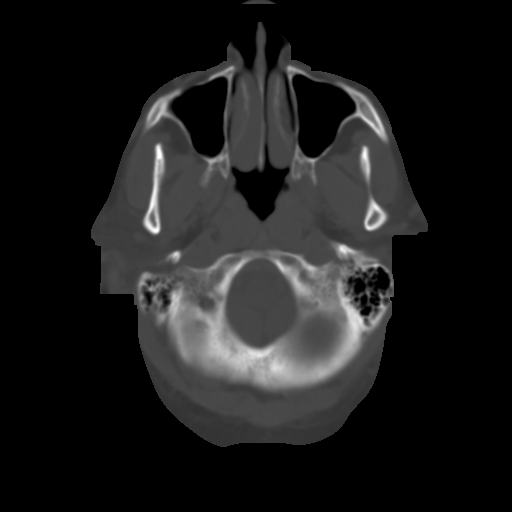
[im 8/32  brain]
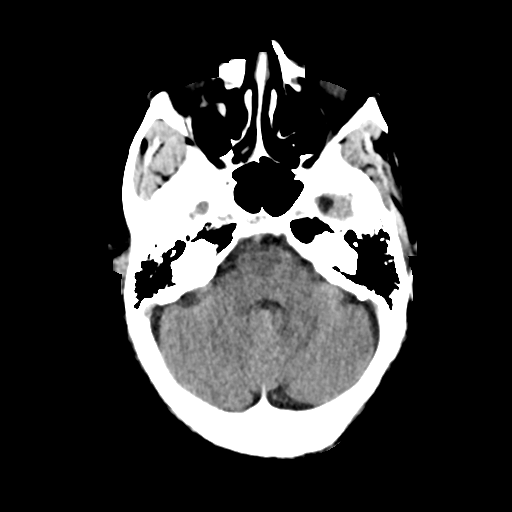
[im 12/32  brain]
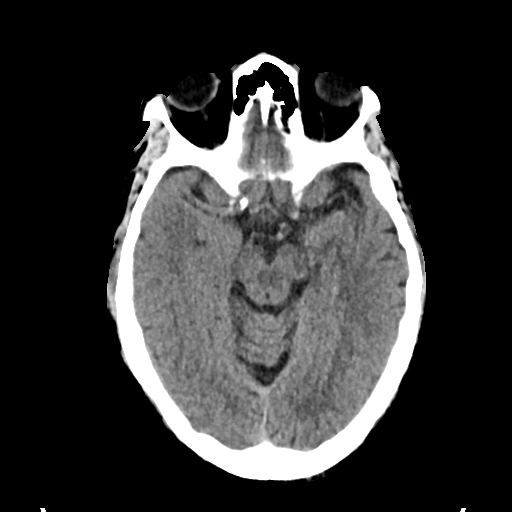
[im 16/32  brain]
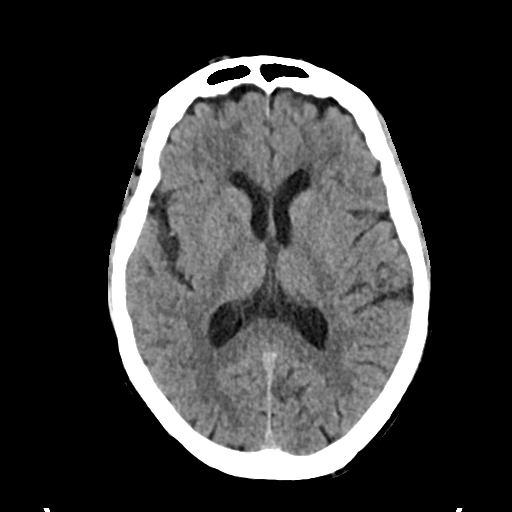
[im 20/32  brain]
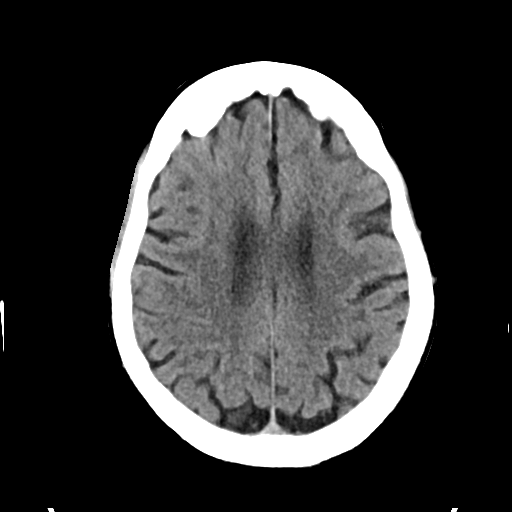
[im 20/32  bone]
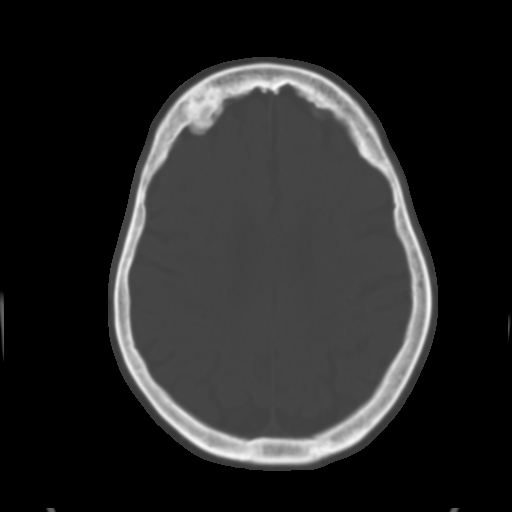
[im 24/32  brain]
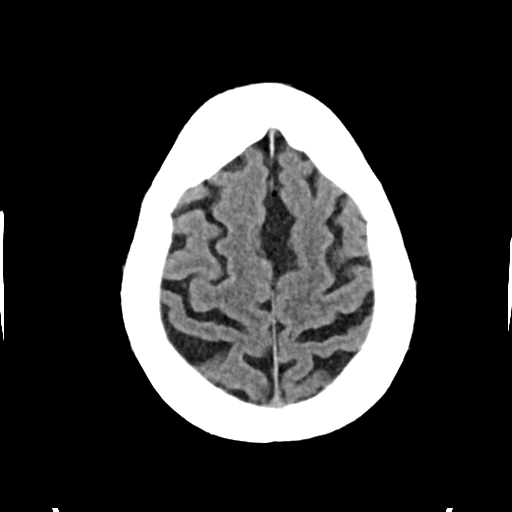
[im 28/32  brain]
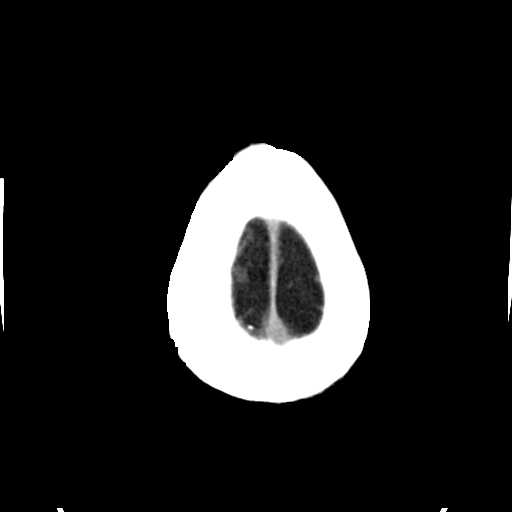

[Series 4: head bone · axial · 0.43mm/px · z∈[-192,-160]mm · 3 of 79 slices shown]
[im 8/79  bone]
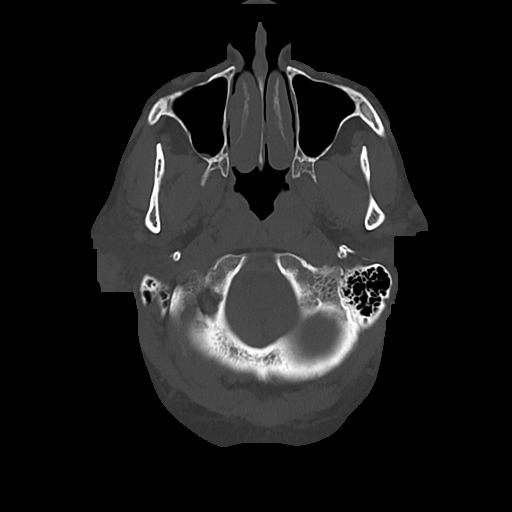
[im 16/79  bone]
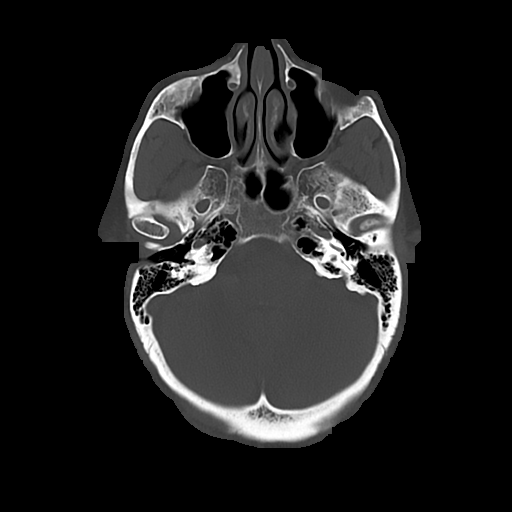
[im 24/79  bone]
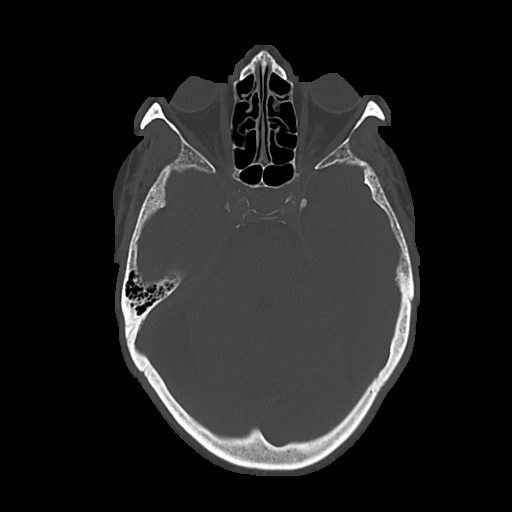

[Series 5: cor soft · coronal · 0.31mm/px · 3 of 69 slices shown]
[im 23/69  brain]
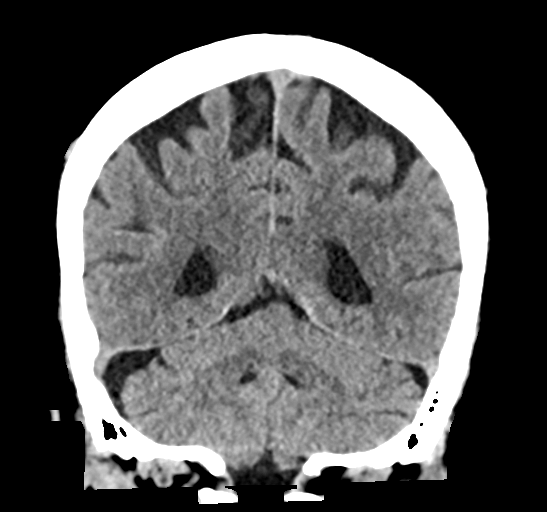
[im 31/69  brain]
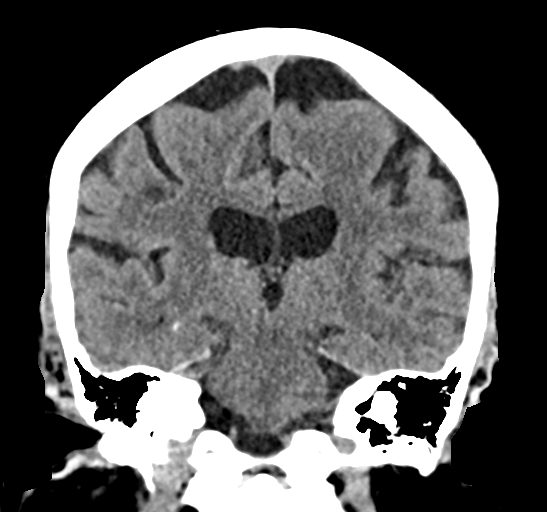
[im 38/69  brain]
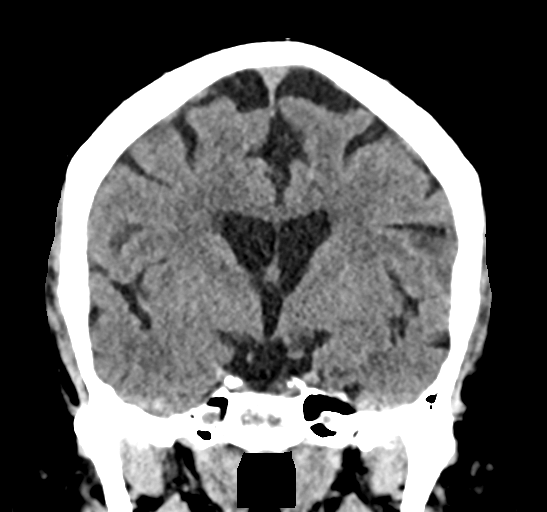

[Series 6: sag soft · sagittal · 0.31mm/px · 3 of 57 slices shown]
[im 19/57  brain]
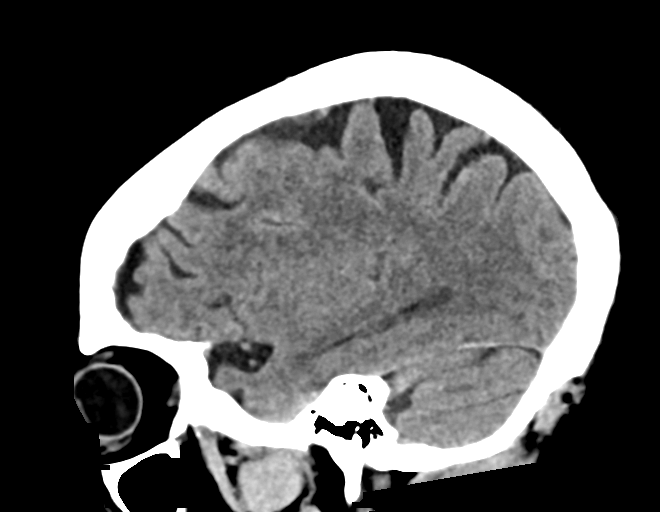
[im 29/57  brain]
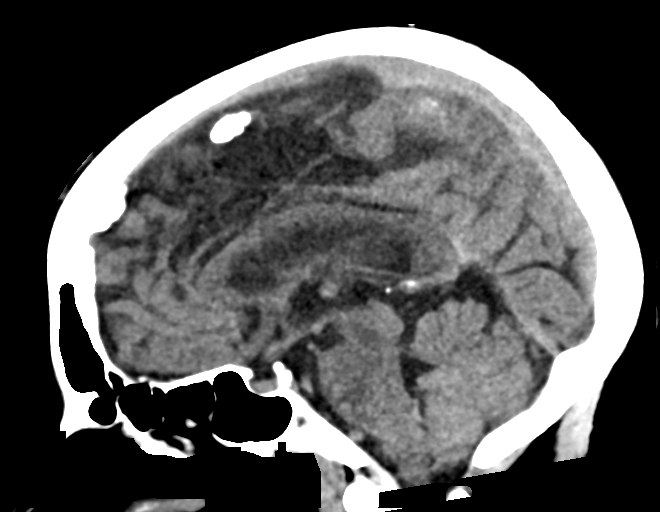
[im 38/57  brain]
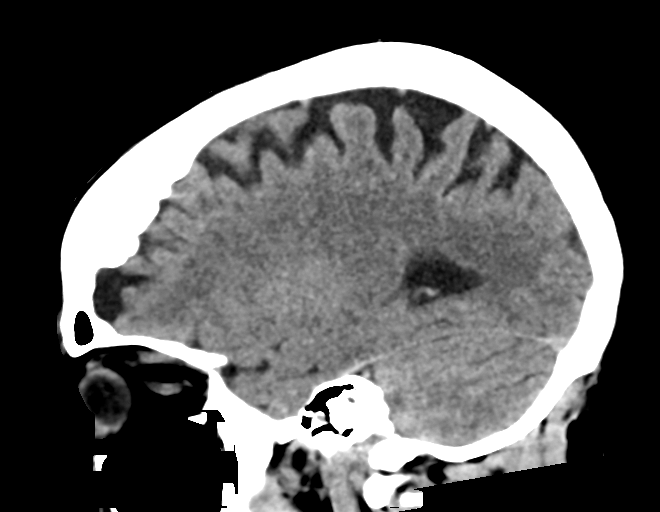

[16 of 47 positions shown; findings below may reference images not displayed]

FINDINGS: Brain: No sign of acute infarction. Age related volume loss. No
evidence of mass, hemorrhage, hydrocephalus or extra-axial
collection.

Vascular: Question hyperdense right M2 branch. This is not definite.
There is atherosclerotic calcification of the major vessels at the
base of the brain.

Skull: Negative

Sinuses/Orbits: Clear/normal

Other: None

ASPECTS (Alberta Stroke Program Early CT Score)

- Ganglionic level infarction (caudate, lentiform nuclei, internal
capsule, insula, M1-M3 cortex): 7

- Supraganglionic infarction (M4-M6 cortex): 3

Total score (0-10 with 10 being normal): 10
IMPRESSION: 1. No acute brain parenchymal finding. Question hyperdense right M2
branch, not definite.
2. ASPECTS is 10.
3. These results were communicated to Dr. Sairyz at [DATE] on
06/18/2021 by text page via the [employer] messaging system.

## 2024-04-29 IMAGING — MR MR MRA HEAD W/O CM
1 series · 18 of 48 positions shown · non-contrast
Comparison: Same-day noncontrast head CT, MR head 07/22/2017

CLINICAL DATA: Altered mental status, stroke suspected



[Series 5: ax (id) · axial · 1.0mm · 0.43mm/px · z∈[-115,-31]mm · 18 of 176 slices shown]
[im 1/176]
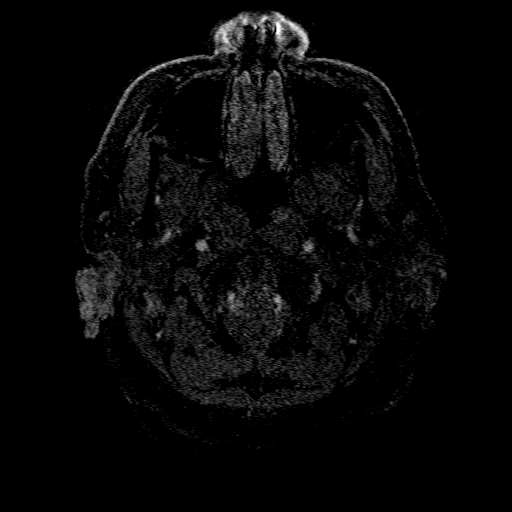
[im 4/176]
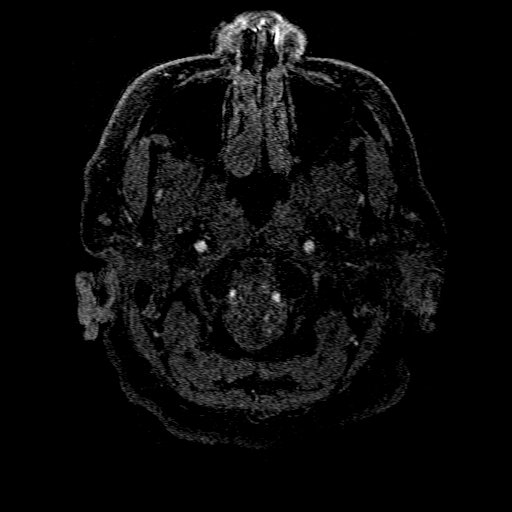
[im 8/176]
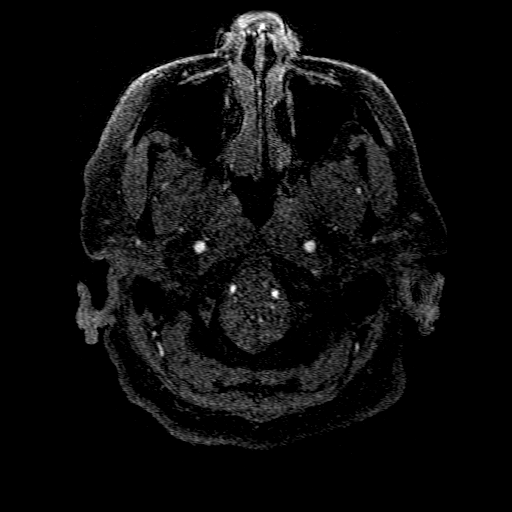
[im 12/176]
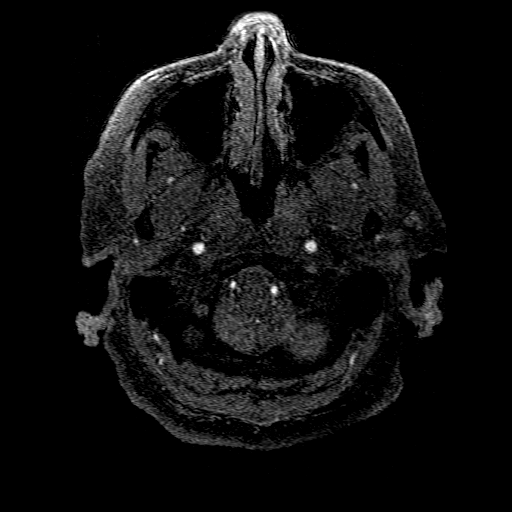
[im 15/176]
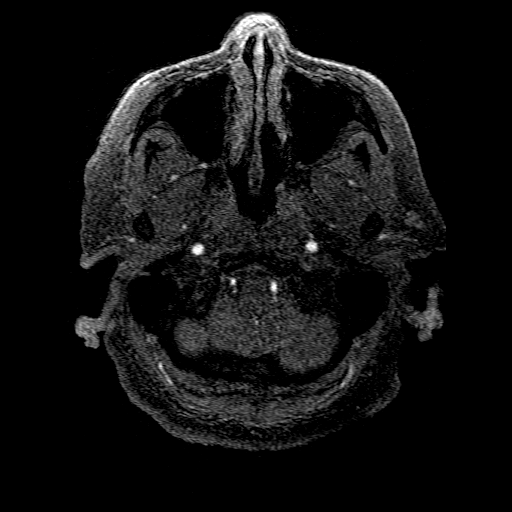
[im 19/176]
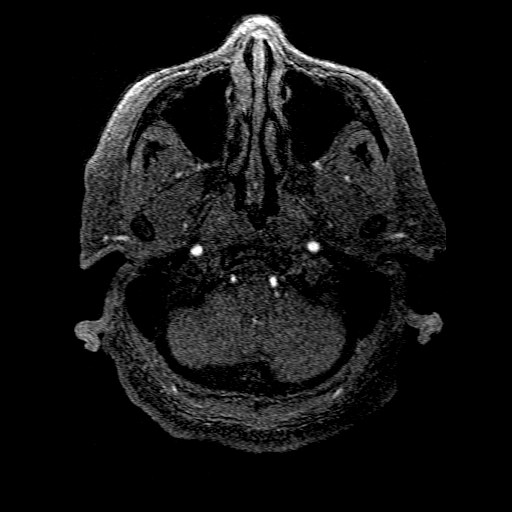
[im 23/176]
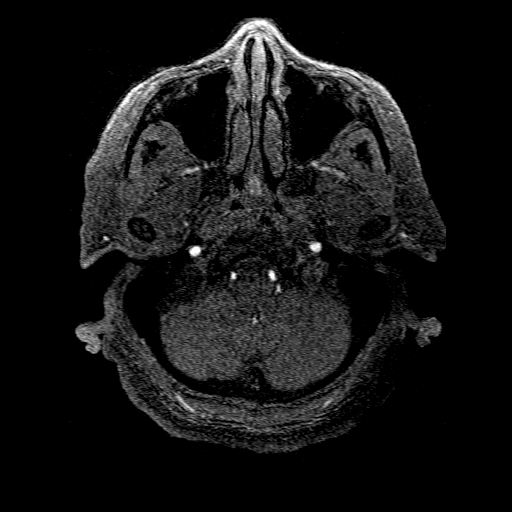
[im 27/176]
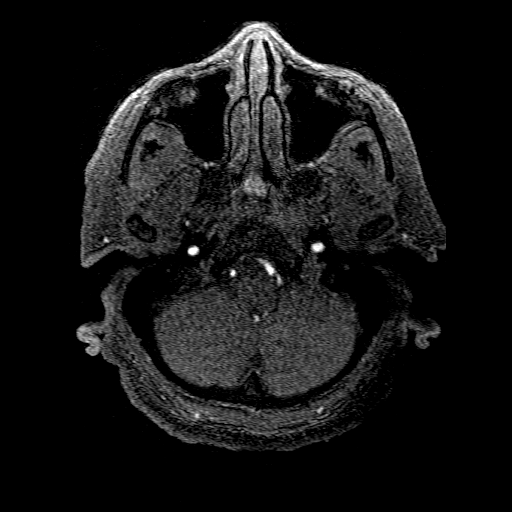
[im 30/176]
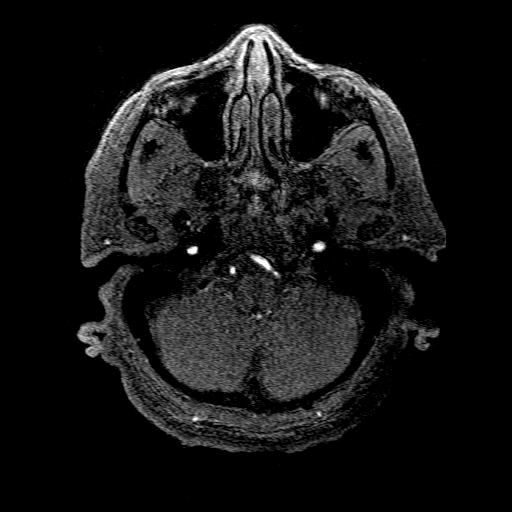
[im 34/176]
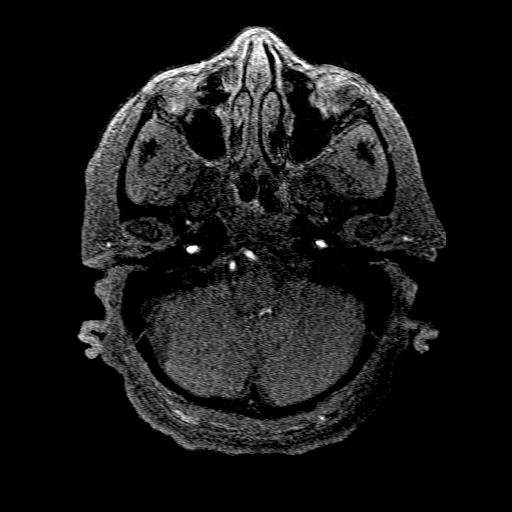
[im 56/176]
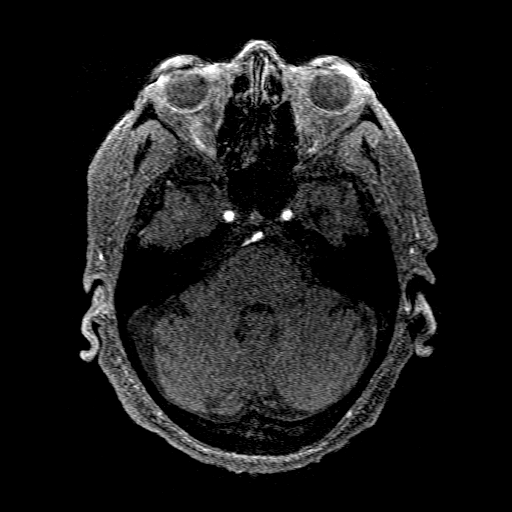
[im 79/176]
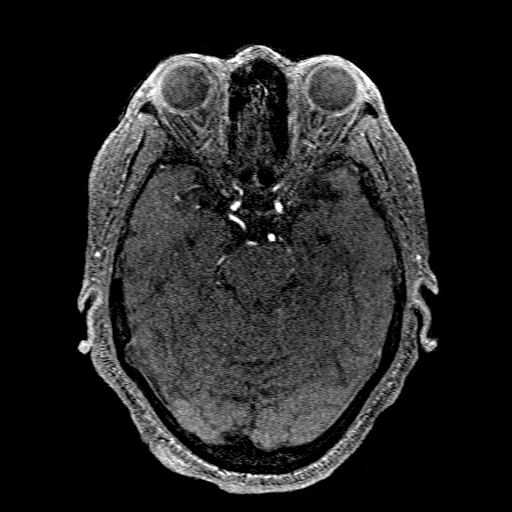
[im 90/176]
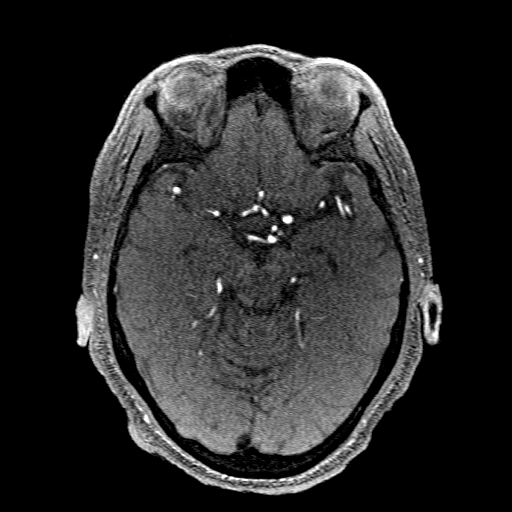
[im 101/176]
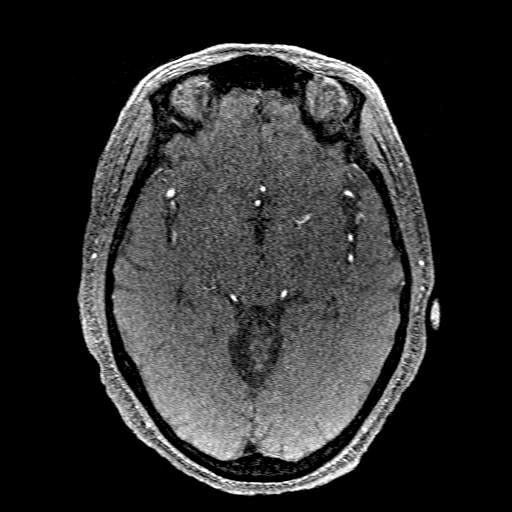
[im 123/176]
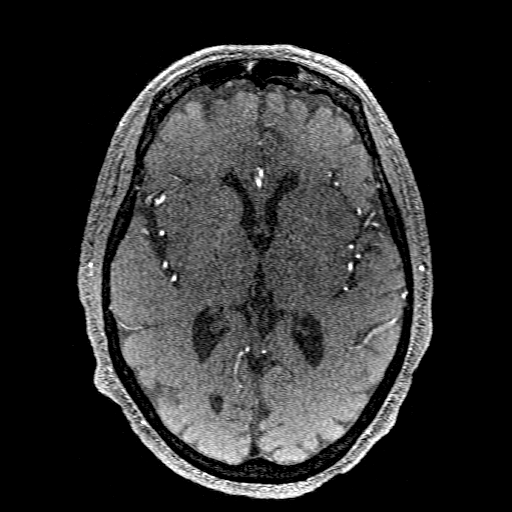
[im 146/176]
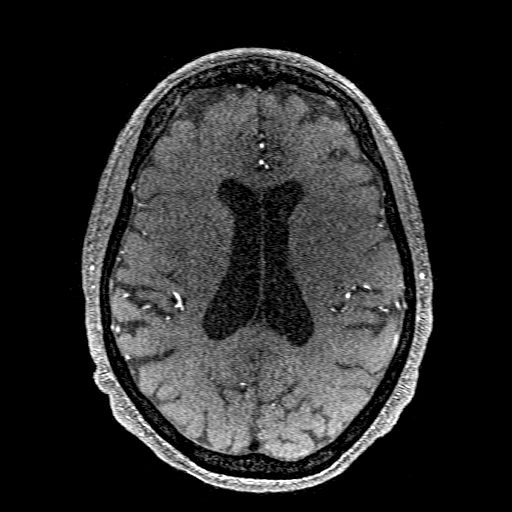
[im 149/176]
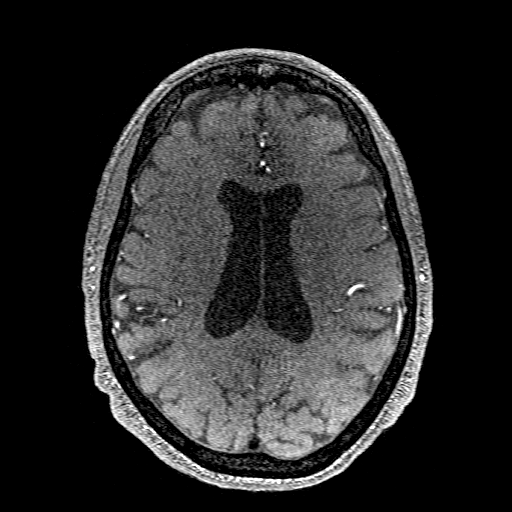
[im 168/176]
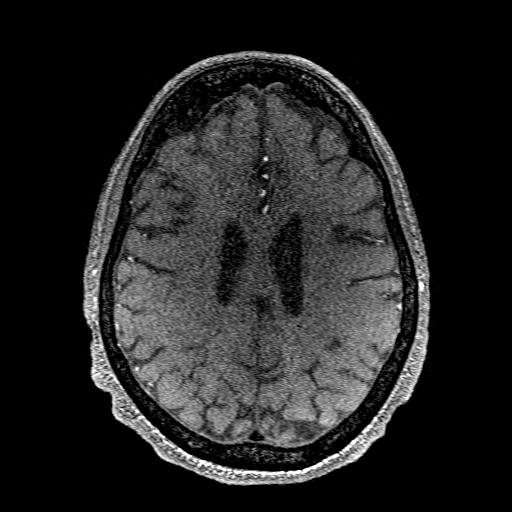

[18 of 48 positions shown; findings below may reference images not displayed]

FINDINGS: MRI HEAD FINDINGS

Brain: There is no acute intracranial hemorrhage, extra-axial fluid
collection, or acute infarct.

There is mild global parenchymal volume loss with prominence of the
ventricular system and extra-axial CSF spaces. Patchy FLAIR signal
abnormality in the subcortical and periventricular white matter
likely reflects sequela of mild chronic white matter
microangiopathy.

There is no suspicious parenchymal signal abnormality. There is no
mass lesion. There is no mass effect or midline shift.

Vascular: See below.

Skull and upper cervical spine: Normal marrow signal.

Sinuses/Orbits: The paranasal sinuses are clear. Bilateral lens
implants are in place. The globes and orbits are otherwise
unremarkable.

Other: None.

MRA HEAD FINDINGS

Anterior circulation: The intracranial ICAs are patent

The bilateral MCAs are patent.

The bilateral ACAs are patent. The anterior communicating artery is
normal.

There is no aneurysm or AVM.

Posterior circulation: Bilateral V4 segments are patent. The basilar
artery is patent.

The bilateral PCAs are patent. A prominent right posterior
communicating artery is identified. The left posterior communicating
artery is not definitely seen.

There is no aneurysm or AVM.

Anatomic variants: As above.

MRA NECK FINDINGS

Aortic arch: The imaged aortic arch is normal. The origins of the
major branch vessels appear patent.

Right carotid system: Proximal common carotid artery is suboptimally
evaluated due to artifact. The imaged common carotid artery is
patent. The internal and external carotid arteries are patent. There
is no evidence of hemodynamically significant stenosis or occlusion.
There is no evidence of dissection or aneurysm.

Left carotid system: The left common, internal, and external carotid
arteries are patent, without evidence of hemodynamically significant
stenosis or occlusion. There is no evidence of dissection or
aneurysm.

Vertebral arteries: Proximal vertebral arteries are suboptimally
evaluated, particularly on the right. The imaged vertebral arteries
are patent with antegrade flow, without evidence of hemodynamically
significant stenosis or occlusion. There is no evidence of
dissection or aneurysm.

Other: None.
IMPRESSION: 1. No acute intracranial pathology. Mild parenchymal volume loss and
chronic white matter microangiopathy.
2. Patent vasculature of the head and neck with no evidence of
hemodynamically significant stenosis, occlusion, or dissection.

## 2024-04-29 IMAGING — DX DG CHEST 1V PORT
1 series · 1 of 1 positions shown · non-contrast
Comparison: 04/11/2021

CLINICAL DATA: Chest pain, confusion

EXAM:
PORTABLE CHEST 1 VIEW

[chest ap]
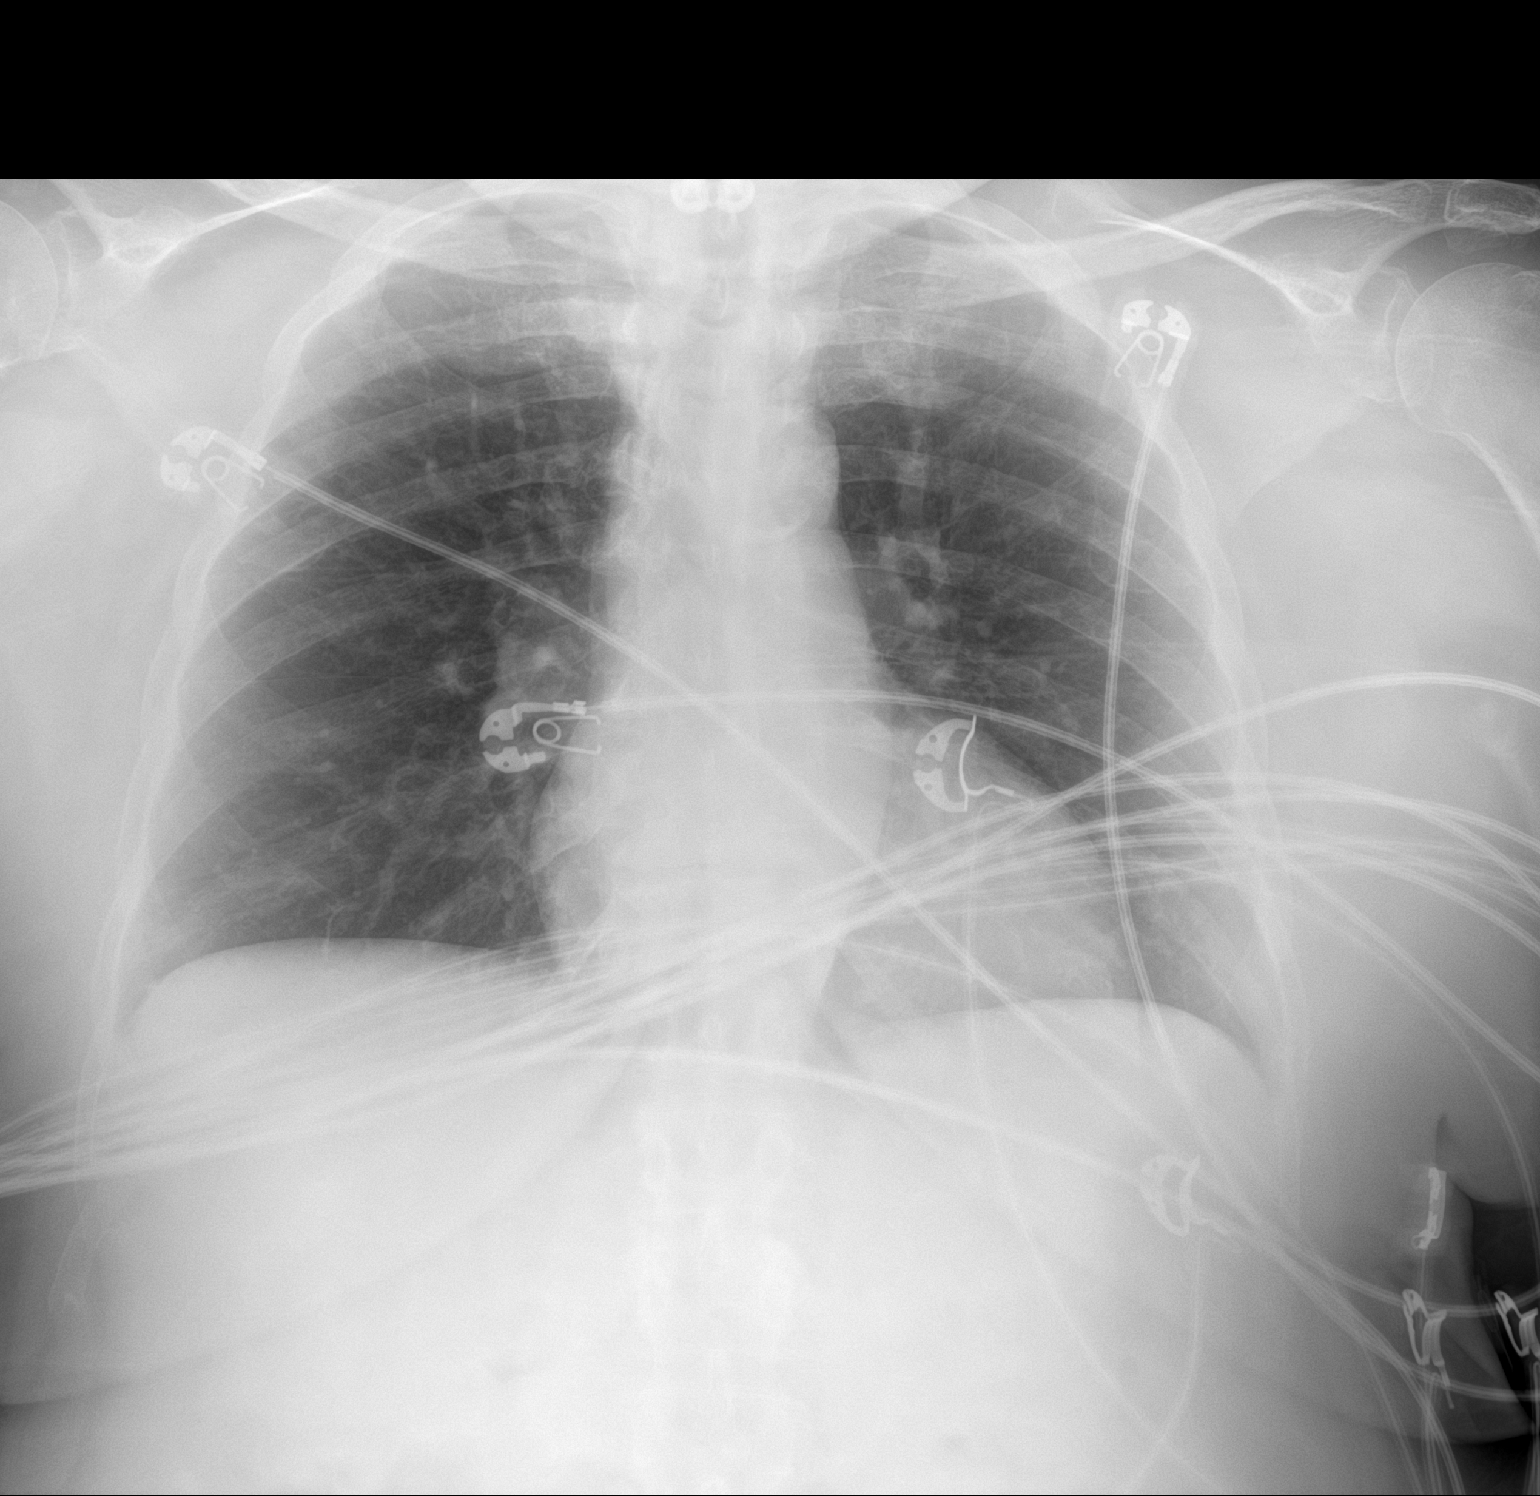

[1 of 1 positions shown; findings below may reference images not displayed]

FINDINGS: The heart size and mediastinal contours are within normal limits.
Both lungs are clear. The visualized skeletal structures are
unremarkable.
IMPRESSION: No active disease.
